# Patient Record
Sex: Male | Born: 1955 | Race: Black or African American | Hispanic: No | Marital: Single | Smoking: Former smoker
Health system: Southern US, Community
[De-identification: ages and names within clinical notes are randomized; demographics above are authoritative.]

## PROBLEM LIST (undated history)

## (undated) DIAGNOSIS — I10 Essential (primary) hypertension: Secondary | ICD-10-CM

## (undated) DIAGNOSIS — F32A Depression, unspecified: Secondary | ICD-10-CM

## (undated) DIAGNOSIS — J449 Chronic obstructive pulmonary disease, unspecified: Secondary | ICD-10-CM

## (undated) DIAGNOSIS — N189 Chronic kidney disease, unspecified: Secondary | ICD-10-CM

## (undated) DIAGNOSIS — F1410 Cocaine abuse, uncomplicated: Secondary | ICD-10-CM

## (undated) DIAGNOSIS — I2510 Atherosclerotic heart disease of native coronary artery without angina pectoris: Secondary | ICD-10-CM

## (undated) DIAGNOSIS — I214 Non-ST elevation (NSTEMI) myocardial infarction: Secondary | ICD-10-CM

## (undated) DIAGNOSIS — J45909 Unspecified asthma, uncomplicated: Secondary | ICD-10-CM

## (undated) DIAGNOSIS — H269 Unspecified cataract: Secondary | ICD-10-CM

## (undated) HISTORY — DX: Cocaine abuse, uncomplicated: F14.10

## (undated) HISTORY — PX: PROSTATE SURGERY: SHX751

## (undated) HISTORY — PX: APPENDECTOMY: SHX54

## (undated) HISTORY — PX: EYE SURGERY: SHX253

## (undated) NOTE — *Deleted (*Deleted)
Adult Psychoeducational Group Not Date:  09/14/2020 Time:  0900-1045 Group Topic/Focus: PROGRESSIVE RELAXATION. A group where deep breathing is taught and tensing and relaxation muscle groups is used. Imagery is used as well.  Pts are asked to imagine 3 pillars that hold them up when they are not able to hold themselves up.  Participation Level:  Active  Participation Quality:  Appropriate  Affect:  Appropriate  Cognitive:  Oriented  Insight: Improving  Engagement in Group:  Engaged  Modes of Intervention:  Activity, Discussion, Education, and Support  Additional Comments:  ***  Shan Padgett A 09/14/2020`  

---

## 2012-10-18 DIAGNOSIS — J449 Chronic obstructive pulmonary disease, unspecified: Secondary | ICD-10-CM | POA: Insufficient documentation

## 2012-10-18 DIAGNOSIS — J45909 Unspecified asthma, uncomplicated: Secondary | ICD-10-CM | POA: Insufficient documentation

## 2016-07-06 DIAGNOSIS — N529 Male erectile dysfunction, unspecified: Secondary | ICD-10-CM | POA: Insufficient documentation

## 2016-07-06 DIAGNOSIS — I10 Essential (primary) hypertension: Secondary | ICD-10-CM | POA: Insufficient documentation

## 2016-07-06 DIAGNOSIS — H409 Unspecified glaucoma: Secondary | ICD-10-CM | POA: Insufficient documentation

## 2016-09-13 DIAGNOSIS — H2511 Age-related nuclear cataract, right eye: Secondary | ICD-10-CM | POA: Insufficient documentation

## 2016-09-15 ENCOUNTER — Ambulatory Visit: Payer: Self-pay | Admitting: Internal Medicine

## 2016-09-26 ENCOUNTER — Emergency Department (HOSPITAL_COMMUNITY)
Admission: EM | Admit: 2016-09-26 | Discharge: 2016-09-26 | Disposition: A | Discharge status: Home / Self Care | Payer: Medicare HMO | Attending: Emergency Medicine | Admitting: Emergency Medicine

## 2016-09-26 ENCOUNTER — Encounter (HOSPITAL_COMMUNITY): Payer: Self-pay | Admitting: Emergency Medicine

## 2016-09-26 DIAGNOSIS — J45909 Unspecified asthma, uncomplicated: Secondary | ICD-10-CM | POA: Insufficient documentation

## 2016-09-26 DIAGNOSIS — F17200 Nicotine dependence, unspecified, uncomplicated: Secondary | ICD-10-CM | POA: Insufficient documentation

## 2016-09-26 DIAGNOSIS — G8918 Other acute postprocedural pain: Secondary | ICD-10-CM | POA: Diagnosis not present

## 2016-09-26 DIAGNOSIS — H5712 Ocular pain, left eye: Secondary | ICD-10-CM | POA: Diagnosis present

## 2016-09-26 HISTORY — DX: Unspecified cataract: H26.9

## 2016-09-26 HISTORY — DX: Unspecified asthma, uncomplicated: J45.909

## 2016-09-26 MED ORDER — HYDROCODONE-ACETAMINOPHEN 5-325 MG PO TABS: 1.0000 | ORAL_TABLET | Freq: Four times a day (QID) | ORAL | 0 refills | Status: DC | PRN

## 2016-09-26 MED ORDER — TETRACAINE HCL 0.5 % OP SOLN
1.0000 [drp] | Freq: Once | OPHTHALMIC | Status: AC
  Administered 2016-09-26: 1 [drp] via OPHTHALMIC
  Filled 2016-09-26: qty 2

## 2016-09-26 NOTE — ED Provider Notes (Signed)
[facility] DEPT Provider Note   CSN: [id number] Arrival date & time: 09/26/16  1448  By signing my name below, I, Robert Chan, attest that this documentation has been prepared under the direction and in the presence of Robert Mail, PA-C. Electronically Signed: Evelene Chan, Scribe. 09/26/2016. 3:25 PM.  History   Chief Complaint Chief Complaint  Patient presents with  . Eye Pain  . Post-op Problem    The history is provided by the patient. No language interpreter was used.   HPI Comments:  Robert Chan is a 60 y.o. male who presents to the Emergency Department s/p left eye cataract surgery with lens implant on 09/17/16 complaining of increased left eye pain x 3-4 days. He reports associated drainage from the eye and photophobia. Pt notes the photophobia is improved when wearing sunglasses. He was discharged with eye drops which he stopped using 2 days ago due to pain. Pt has a follow up appointment on 09/28/16. Pt has no acute complaints or symptoms at this time.   Surgery done at [facility]  Past Medical History:  Diagnosis Date  . Asthma   . Cataract     There are no active problems to display for this patient.   Past Surgical History:  Procedure Laterality Date  . APPENDECTOMY    . EYE SURGERY    . PROSTATE SURGERY         Home Medications    Prior to Admission medications   Medication Sig Start Date End Date Taking? Authorizing Provider  HYDROcodone-acetaminophen (NORCO) 5-325 MG tablet Take 1-2 tablets by mouth every 6 (six) hours as needed. 09/26/16   Robert Mail, PA-C    Family History No family history on file.  Social History Social History  Substance Use Topics  . Smoking status: Current Some Day Smoker  . Smokeless tobacco: Never Used  . Alcohol use Yes     Allergies   Patient has no known allergies.   Review of Systems Review of Systems  Constitutional: Negative for fever.  Eyes: Positive for photophobia, pain and  discharge.     Physical Exam Updated Vital Signs BP 126/84   Pulse 82   Temp 97.7 F (36.5 C) (Oral)   Resp 18   Ht 6\' 4"  (1.93 m)   Wt 113.4 kg   SpO2 100%   BMI 30.43 kg/m   Physical Exam  Constitutional: He is oriented to person, place, and time. He appears well-developed and well-nourished. No distress.  HENT:  Head: Normocephalic and atraumatic.  Eyes: Left eye exhibits no chemosis. Left conjunctiva is injected.  Left eye is injected and sensitive to light ciliary flush  2+ cells with mild flare corneal flap appears to be healing well  Visual acuity and pressure: OD: 20/50; 16 OS: 20/30; 15 Bilateral: 20/30  Cardiovascular: Normal rate.   Pulmonary/Chest: Effort normal.  Abdominal: He exhibits no distension.  Neurological: He is alert and oriented to person, place, and time.  Skin: Skin is warm and dry.  Psychiatric: He has a normal mood and affect.  Nursing note and vitals reviewed.    ED Treatments / Results  DIAGNOSTIC STUDIES:  Oxygen Saturation is 100% on RA, normal by my interpretation.    COORDINATION OF CARE:  3:24 PM Discussed treatment plan with pt at bedside and pt agreed to plan.  Labs (all labs ordered are listed, but only abnormal results are displayed) Labs Reviewed - No data to display  EKG  EKG Interpretation None  Radiology No results found.  Procedures Procedures (including critical care time)  Medications Ordered in ED Medications  tetracaine (PONTOCAINE) 0.5 % ophthalmic solution 1 drop (1 drop Both Eyes Given 09/26/16 1600)     Initial Impression / Assessment and Plan / ED Course  I have reviewed the triage vital signs and the nursing notes.  Pertinent labs & imaging results that were available during my care of the patient were reviewed by me and considered in my medical decision making (see chart for details).  Clinical Course    Patient with Inflamed left eye s/p cataract surgery and lens implant on  09/16/2016. I discussed the case with Dr. Redmond Baseman was on-call for the ophthalmology practice. He does not feel that this is endophthalmitis. He hasn't the patient resume his latanoprost drops immediately and then take the rest of his drops as directed. Marland Kitchen He will be seen in their office first thing Monday morning. I have written the patient for pain medication for his eye.  Patient seen in shared visit with attending physician. Who agrees with assessment, work up , treatment, and plan for discharge.   Final Clinical Impressions(s) / ED Diagnoses   Final diagnoses:  Post-operative pain    New Prescriptions Discharge Medication List as of 09/26/2016  4:23 PM    START taking these medications   Details  HYDROcodone-acetaminophen (NORCO) 5-325 MG tablet Take 1-2 tablets by mouth every 6 (six) hours as needed., Starting Sun 09/26/2016, Print       I personally performed the services described in this documentation, which was scribed in my presence. The recorded information has been reviewed and is accurate.    t}    Robert Mail, PA-C 09/26/16 1640    Duffy Bruce, MD 09/28/16 [phone]

## 2016-09-26 NOTE — Discharge Instructions (Signed)
Take your latanoprost now and then resume your regular eyedrop routine. Call the office first thing in the morning to be seen.

## 2016-09-26 NOTE — ED Triage Notes (Signed)
Pt reports had cataract surgery to right eye on 09/17/2016. Pt reports 2-3 days of having clear drainage from right eye. Pt has appointment with eye doctor next week.

## 2016-09-26 NOTE — ED Notes (Signed)
Declined W/C at D/C and was escorted to lobby by RN. 

## 2017-01-09 ENCOUNTER — Encounter (HOSPITAL_COMMUNITY): Payer: Self-pay

## 2017-01-09 ENCOUNTER — Emergency Department (HOSPITAL_COMMUNITY)
Admission: EM | Admit: 2017-01-09 | Discharge: 2017-01-09 | Disposition: A | Discharge status: Home / Self Care | Payer: Medicare HMO | Attending: Emergency Medicine | Admitting: Emergency Medicine

## 2017-01-09 DIAGNOSIS — I10 Essential (primary) hypertension: Secondary | ICD-10-CM | POA: Diagnosis not present

## 2017-01-09 DIAGNOSIS — F17200 Nicotine dependence, unspecified, uncomplicated: Secondary | ICD-10-CM | POA: Insufficient documentation

## 2017-01-09 DIAGNOSIS — J45909 Unspecified asthma, uncomplicated: Secondary | ICD-10-CM | POA: Insufficient documentation

## 2017-01-09 DIAGNOSIS — R5381 Other malaise: Secondary | ICD-10-CM | POA: Diagnosis present

## 2017-01-09 LAB — BASIC METABOLIC PANEL
Anion gap: 7 (ref 5–15)
BUN: 12 mg/dL (ref 6–20)
CHLORIDE: 108 mmol/L (ref 101–111)
CO2: 22 mmol/L (ref 22–32)
CREATININE: 1.32 mg/dL — AB (ref 0.61–1.24)
Calcium: 8.8 mg/dL — ABNORMAL LOW (ref 8.9–10.3)
GFR calc Af Amer: >60 mL/min (ref >60)
GFR calc non Af Amer: 56 mL/min — ABNORMAL LOW (ref >60)
GLUCOSE: 137 mg/dL — AB (ref 65–99)
Potassium: 3.8 mmol/L (ref 3.5–5.1)
Sodium: 137 mmol/L (ref 135–145)

## 2017-01-09 LAB — CBC WITH DIFFERENTIAL/PLATELET
Basophils Absolute: 0 10*3/uL (ref 0.0–0.1)
Basophils Relative: 0 %
Eosinophils Absolute: 0.3 10*3/uL (ref 0.0–0.7)
Eosinophils Relative: 3 %
HEMATOCRIT: 41.8 % (ref 39.0–52.0)
HEMOGLOBIN: 13.6 g/dL (ref 13.0–17.0)
LYMPHS ABS: 2.4 10*3/uL (ref 0.7–4.0)
Lymphocytes Relative: 25 %
MCH: 30 pg (ref 26.0–34.0)
MCHC: 32.5 g/dL (ref 30.0–36.0)
MCV: 92.3 fL (ref 78.0–100.0)
MONOS PCT: 6 %
Monocytes Absolute: 0.6 10*3/uL (ref 0.1–1.0)
NEUTROS ABS: 6.2 10*3/uL (ref 1.7–7.7)
NEUTROS PCT: 66 %
Platelets: 266 10*3/uL (ref 150–400)
RBC: 4.53 MIL/uL (ref 4.22–5.81)
RDW: 12.6 % (ref 11.5–15.5)
WBC: 9.4 10*3/uL (ref 4.0–10.5)

## 2017-01-09 LAB — URINALYSIS, ROUTINE W REFLEX MICROSCOPIC
Bacteria, UA: NONE SEEN
Bilirubin Urine: NEGATIVE
GLUCOSE, UA: NEGATIVE mg/dL
KETONES UR: NEGATIVE mg/dL
Leukocytes, UA: NEGATIVE
NITRITE: NEGATIVE
PROTEIN: NEGATIVE mg/dL
Specific Gravity, Urine: 1.025 (ref 1.005–1.030)
pH: 5 (ref 5.0–8.0)

## 2017-01-09 NOTE — ED Notes (Signed)
Reports feeling of not being usual spunky self for several days.  Thought he would come in to get checked "even though I have a dr appt on Thursday, I could have waited till then but did not want to."

## 2017-01-09 NOTE — ED Provider Notes (Signed)
[place] DEPT Provider Note   CSN: [id number] Arrival date & time: 01/09/17  1006     History   Chief Complaint Chief Complaint  Patient presents with  . general malaise    HPI Robert Chan is a 61 y.o. male.  61 year old male presents with generalized malaise 1 week. Denies any associated fever, cough, congestion. No diarrhea. Thinks his blood pressure may be elevated. No anginal chest pain. No dyspnea. No urinary symptoms. No recent medications. No history of same. Symptoms persistent and nothing seems to make them better worse. No treatment use prior to arrival.      Past Medical History:  Diagnosis Date  . Asthma   . Cataract     There are no active problems to display for this patient.   Past Surgical History:  Procedure Laterality Date  . APPENDECTOMY    . EYE SURGERY    . PROSTATE SURGERY         Home Medications    Prior to Admission medications   Medication Sig Start Date End Date Taking? Authorizing Provider  HYDROcodone-acetaminophen (NORCO) 5-325 MG tablet Take 1-2 tablets by mouth every 6 (six) hours as needed. 09/26/16   Margarita Mail, PA-C    Family History No family history on file.  Social History Social History  Substance Use Topics  . Smoking status: Current Some Day Smoker  . Smokeless tobacco: Never Used  . Alcohol use Yes     Allergies   Patient has no known allergies.   Review of Systems Review of Systems  All other systems reviewed and are negative.    Physical Exam Updated Vital Signs BP 135/88 (BP Location: Right Arm)   Pulse 74   Temp 98.2 F (36.8 C) (Oral)   Resp 18   SpO2 97%   Physical Exam  Constitutional: He is oriented to person, place, and time. He appears well-developed and well-nourished.  Non-toxic appearance. No distress.  HENT:  Head: Normocephalic and atraumatic.  Eyes: Conjunctivae, EOM and lids are normal. Pupils are equal, round, and reactive to light.  Neck: Normal range of  motion. Neck supple. No tracheal deviation present. No thyroid mass present.  Cardiovascular: Normal rate, regular rhythm and normal heart sounds.  Exam reveals no gallop.   No murmur heard. Pulmonary/Chest: Effort normal and breath sounds normal. No stridor. No respiratory distress. He has no decreased breath sounds. He has no wheezes. He has no rhonchi. He has no rales.  Abdominal: Soft. Normal appearance and bowel sounds are normal. He exhibits no distension. There is no tenderness. There is no rebound and no CVA tenderness.  Musculoskeletal: Normal range of motion. He exhibits no edema or tenderness.  Neurological: He is alert and oriented to person, place, and time. He has normal strength. No cranial nerve deficit or sensory deficit. GCS eye subscore is 4. GCS verbal subscore is 5. GCS motor subscore is 6.  Skin: Skin is warm and dry. No abrasion and no rash noted.  Psychiatric: He has a normal mood and affect. His speech is normal and behavior is normal.  Nursing note and vitals reviewed.    ED Treatments / Results  Labs (all labs ordered are listed, but only abnormal results are displayed) Labs Reviewed  BASIC METABOLIC PANEL - Abnormal; Notable for the following:       Result Value   Glucose, Bld 137 (*)    Creatinine, Ser 1.32 (*)    Calcium 8.8 (*)    GFR calc non Af  Amer 56 (*)    All other components within normal limits  CBC WITH DIFFERENTIAL/PLATELET  URINALYSIS, ROUTINE W REFLEX MICROSCOPIC    EKG  EKG Interpretation None       Radiology No results found.  Procedures Procedures (including critical care time)  Medications Ordered in ED Medications - No data to display   Initial Impression / Assessment and Plan / ED Course  I have reviewed the triage vital signs and the nursing notes.  Pertinent labs & imaging results that were available during my care of the patient were reviewed by me and considered in my medical decision making (see chart for  details).     Labs are reassuring patient to follow-up with his doctor about his high blood pressure  Final Clinical Impressions(s) / ED Diagnoses   Final diagnoses:  None    New Prescriptions New Prescriptions   No medications on file     Lacretia Leigh, MD 01/09/17 1303

## 2017-01-09 NOTE — ED Triage Notes (Signed)
Patient complains of 1 week of general malaise. No chills, no URI, no GI complaints, no pain. On arrival alert and oriented, NAD. vss

## 2017-08-18 ENCOUNTER — Emergency Department (HOSPITAL_COMMUNITY)
Admission: EM | Admit: 2017-08-18 | Discharge: 2017-08-18 | Disposition: A | Discharge status: Home / Self Care | Payer: Medicare HMO | Attending: Emergency Medicine | Admitting: Emergency Medicine

## 2017-08-18 ENCOUNTER — Emergency Department (HOSPITAL_COMMUNITY): Payer: Medicare HMO

## 2017-08-18 ENCOUNTER — Encounter (HOSPITAL_COMMUNITY): Payer: Self-pay | Admitting: Emergency Medicine

## 2017-08-18 DIAGNOSIS — Z79899 Other long term (current) drug therapy: Secondary | ICD-10-CM | POA: Insufficient documentation

## 2017-08-18 DIAGNOSIS — B353 Tinea pedis: Secondary | ICD-10-CM | POA: Diagnosis not present

## 2017-08-18 DIAGNOSIS — J441 Chronic obstructive pulmonary disease with (acute) exacerbation: Secondary | ICD-10-CM | POA: Diagnosis not present

## 2017-08-18 DIAGNOSIS — J45909 Unspecified asthma, uncomplicated: Secondary | ICD-10-CM | POA: Diagnosis not present

## 2017-08-18 DIAGNOSIS — R0602 Shortness of breath: Secondary | ICD-10-CM | POA: Diagnosis present

## 2017-08-18 DIAGNOSIS — F17200 Nicotine dependence, unspecified, uncomplicated: Secondary | ICD-10-CM | POA: Insufficient documentation

## 2017-08-18 LAB — BASIC METABOLIC PANEL
Anion gap: 8 (ref 5–15)
BUN: 12 mg/dL (ref 6–20)
CHLORIDE: 108 mmol/L (ref 101–111)
CO2: 22 mmol/L (ref 22–32)
CREATININE: 1.38 mg/dL — AB (ref 0.61–1.24)
Calcium: 8.7 mg/dL — ABNORMAL LOW (ref 8.9–10.3)
GFR calc Af Amer: >60 mL/min (ref >60)
GFR calc non Af Amer: 53 mL/min — ABNORMAL LOW (ref >60)
Glucose, Bld: 150 mg/dL — ABNORMAL HIGH (ref 65–99)
POTASSIUM: 3.5 mmol/L (ref 3.5–5.1)
SODIUM: 138 mmol/L (ref 135–145)

## 2017-08-18 LAB — CBC
HEMATOCRIT: 37.4 % — AB (ref 39.0–52.0)
Hemoglobin: 12.5 g/dL — ABNORMAL LOW (ref 13.0–17.0)
MCH: 30.9 pg (ref 26.0–34.0)
MCHC: 33.4 g/dL (ref 30.0–36.0)
MCV: 92.6 fL (ref 78.0–100.0)
PLATELETS: 253 10*3/uL (ref 150–400)
RBC: 4.04 MIL/uL — ABNORMAL LOW (ref 4.22–5.81)
RDW: 12.6 % (ref 11.5–15.5)
WBC: 10.2 10*3/uL (ref 4.0–10.5)

## 2017-08-18 LAB — I-STAT TROPONIN, ED: Troponin i, poc: 0.01 ng/mL (ref 0.00–0.08)

## 2017-08-18 MED ORDER — ALBUTEROL SULFATE (2.5 MG/3ML) 0.083% IN NEBU
5.0000 mg | INHALATION_SOLUTION | Freq: Once | RESPIRATORY_TRACT | Status: AC
  Administered 2017-08-18: 5 mg via RESPIRATORY_TRACT
  Filled 2017-08-18: qty 6

## 2017-08-18 MED ORDER — CLOTRIMAZOLE 1 % EX CREA: TOPICAL_CREAM | CUTANEOUS | 0 refills | Status: DC

## 2017-08-18 MED ORDER — IPRATROPIUM BROMIDE 0.02 % IN SOLN
0.5000 mg | Freq: Once | RESPIRATORY_TRACT | Status: AC
  Administered 2017-08-18: 0.5 mg via RESPIRATORY_TRACT
  Filled 2017-08-18: qty 2.5

## 2017-08-18 MED ORDER — ALBUTEROL SULFATE (2.5 MG/3ML) 0.083% IN NEBU
INHALATION_SOLUTION | RESPIRATORY_TRACT | Status: AC
  Filled 2017-08-18: qty 6

## 2017-08-18 MED ORDER — ALBUTEROL SULFATE HFA 108 (90 BASE) MCG/ACT IN AERS
2.0000 | INHALATION_SPRAY | RESPIRATORY_TRACT | Status: DC | PRN
  Administered 2017-08-18: 2 via RESPIRATORY_TRACT
  Filled 2017-08-18: qty 6.7

## 2017-08-18 MED ORDER — ALBUTEROL SULFATE (2.5 MG/3ML) 0.083% IN NEBU
5.0000 mg | INHALATION_SOLUTION | Freq: Once | RESPIRATORY_TRACT | Status: AC
  Administered 2017-08-18: 5 mg via RESPIRATORY_TRACT

## 2017-08-18 MED ORDER — PREDNISONE 20 MG PO TABS: 60.0000 mg | ORAL_TABLET | Freq: Every day | ORAL | 0 refills | Status: DC

## 2017-08-18 NOTE — Discharge Instructions (Signed)
FOLLOW UP WITH YOUR DOCTOR FOR RECHECK AND FURTHER MANAGEMENT OF COPD EARLY NEXT WEEK. TAKE MEDICATIONS AS PRESCRIBED AND RETURN TO THE EMERGENCY DEPARTMENT WITH ANY WORSENING SYMPTOMS.

## 2017-08-18 NOTE — ED Provider Notes (Signed)
[place] EMERGENCY DEPARTMENT Provider Note   CSN: [id number] Arrival date & time: 08/18/17  0153     History   Chief Complaint Chief Complaint  Patient presents with  . Shortness of Breath    HPI Robert Chan is a 61 y.o. male.  Patient with a history of COPD, presents with SOB, and chest tightness similar to previous episodes of COPD exacerbation. No fever or productive cough. He reports he recently moved to the area and no longer has his nebulizer and he ran out of his inhaler. No vomiting, diaphoresis. He also complains of a rash to bilateral feet that is dry, itching and scaling. No open wounds, bleeding or drainage.   The history is provided by the patient. No language interpreter was used.  Shortness of Breath  Pertinent negatives include no fever, no cough and no vomiting.    Past Medical History:  Diagnosis Date  . Asthma   . Cataract     There are no active problems to display for this patient.   Past Surgical History:  Procedure Laterality Date  . APPENDECTOMY    . EYE SURGERY    . PROSTATE SURGERY         Home Medications    Prior to Admission medications   Medication Sig Start Date End Date Taking? Authorizing Provider  albuterol (PROVENTIL HFA;VENTOLIN HFA) 108 (90 Base) MCG/ACT inhaler Inhale 1-2 puffs into the lungs every 6 (six) hours as needed for wheezing or shortness of breath.   Yes [provider]  budesonide-formoterol (SYMBICORT) 160-4.5 MCG/ACT inhaler Inhale 2 puffs into the lungs 2 (two) times daily. 07/06/16  Yes [provider]  HYDROcodone-acetaminophen (NORCO) 5-325 MG tablet Take 1-2 tablets by mouth every 6 (six) hours as needed. Patient not taking: Reported on 08/18/2017 09/26/16   Margarita Mail, PA-C    Family History No family history on file.  Social History Social History  Substance Use Topics  . Smoking status: Current Some Day Smoker  . Smokeless tobacco: Never Used  .  Alcohol use Yes     Allergies   Patient has no known allergies.   Review of Systems Review of Systems  Constitutional: Negative for fever.  HENT: Negative.   Respiratory: Positive for chest tightness and shortness of breath. Negative for cough.   Gastrointestinal: Negative for nausea and vomiting.  Musculoskeletal: Negative.      Physical Exam Updated Vital Signs BP (!) 148/91   Pulse 69   Temp 97.6 F (36.4 C) (Oral)   Resp 19   Ht 6\' 4"  (1.93 m)   Wt 104.3 kg (230 lb)   SpO2 100%   BMI 28.00 kg/m   Physical Exam  Constitutional: He is oriented to person, place, and time. He appears well-developed and well-nourished.  HENT:  Head: Normocephalic.  Neck: Normal range of motion. Neck supple.  Cardiovascular: Normal rate and regular rhythm.   No murmur heard. Pulmonary/Chest: Effort normal and breath sounds normal. He has no wheezes. He has no rales. He exhibits no tenderness.  *Patient examined after nebulizer treatment x 1. Minimal end expiratory wheezing in upper lobes.   Abdominal: Soft. Bowel sounds are normal. There is no tenderness. There is no rebound and no guarding.  Musculoskeletal: Normal range of motion. He exhibits no edema.  Neurological: He is alert and oriented to person, place, and time.  Skin: Skin is warm and dry. No rash noted.  Hyperpigmented, scaling patchy rash to dorsal feet bilaterally. No interphalangeal  rash. No open sores or drainage.   Psychiatric: He has a normal mood and affect.     ED Treatments / Results  Labs (all labs ordered are listed, but only abnormal results are displayed) Labs Reviewed  BASIC METABOLIC PANEL - Abnormal; Notable for the following:       Result Value   Glucose, Bld 150 (*)    Creatinine, Ser 1.38 (*)    Calcium 8.7 (*)    GFR calc non Af Amer 53 (*)    All other components within normal limits  CBC - Abnormal; Notable for the following:    RBC 4.04 (*)    Hemoglobin 12.5 (*)    HCT 37.4 (*)    All  other components within normal limits  I-STAT TROPONIN, ED   Results for orders placed or performed during the hospital encounter of 60/63/01  Basic metabolic panel  Result Value Ref Range   Sodium 138 135 - 145 mmol/L   Potassium 3.5 3.5 - 5.1 mmol/L   Chloride 108 101 - 111 mmol/L   CO2 22 22 - 32 mmol/L   Glucose, Bld 150 (H) 65 - 99 mg/dL   BUN 12 6 - 20 mg/dL   Creatinine, Ser 1.38 (H) 0.61 - 1.24 mg/dL   Calcium 8.7 (L) 8.9 - 10.3 mg/dL   GFR calc non Af Amer 53 (L) >60 mL/min   GFR calc Af Amer >60 >60 mL/min   Anion gap 8 5 - 15  CBC  Result Value Ref Range   WBC 10.2 4.0 - 10.5 K/uL   RBC 4.04 (L) 4.22 - 5.81 MIL/uL   Hemoglobin 12.5 (L) 13.0 - 17.0 g/dL   HCT 37.4 (L) 39.0 - 52.0 %   MCV 92.6 78.0 - 100.0 fL   MCH 30.9 26.0 - 34.0 pg   MCHC 33.4 30.0 - 36.0 g/dL   RDW 12.6 11.5 - 15.5 %   Platelets 253 150 - 400 K/uL  I-Stat Troponin, ED (not at [facility])  Result Value Ref Range   Troponin i, poc 0.01 0.00 - 0.08 ng/mL   Comment 3            EKG  EKG Interpretation  Date/Time:  Thursday August 18 2017 02:19:05 EDT Ventricular Rate:  75 PR Interval:  192 QRS Duration: 96 QT Interval:  396 QTC Calculation: 442 R Axis:   46 Text Interpretation:  Normal sinus rhythm Low voltage QRS ST & T wave abnormality, consider anterior ischemia Abnormal ECG biphasic t waves noted in anterior leads Confirmed by Ripley Fraise [phone]) on 08/18/2017 2:40:24 AM       Radiology Dg Chest 2 View  Result Date: 08/18/2017 CLINICAL DATA:  Cough, congestion, and shortness of breath for 4 days. History of COPD and asthma. EXAM: CHEST  2 VIEW COMPARISON:  None. FINDINGS: Mild hyperinflation. Normal heart size and pulmonary vascularity. No focal airspace disease or consolidation in the lungs. No blunting of costophrenic angles. No pneumothorax. Mediastinal contours appear intact. IMPRESSION: No active cardiopulmonary disease. Electronically Signed   By: Lucienne Capers M.D.   On:  08/18/2017 02:46    Procedures Procedures (including critical care time)  Medications Ordered in ED Medications  albuterol (PROVENTIL) (2.5 MG/3ML) 0.083% nebulizer solution 5 mg (5 mg Nebulization Given 08/18/17 0219)  albuterol (PROVENTIL) (2.5 MG/3ML) 0.083% nebulizer solution 5 mg (5 mg Nebulization Given 08/18/17 0527)  ipratropium (ATROVENT) nebulizer solution 0.5 mg (0.5 mg Nebulization Given 08/18/17 0527)     Initial Impression /  Assessment and Plan / ED Course  I have reviewed the triage vital signs and the nursing notes.  Pertinent labs & imaging results that were available during my care of the patient were reviewed by me and considered in my medical decision making (see chart for details).     Patient presents with wheezing and SOB with chest tightness. He denies chest pain. Labs are reassuring, negative troponin. EKG without ischemic change. CXR clear.   Second nebulizer treatment provided with complete resolution of wheezing. Patient states he feels much better. He can be discharged home with PCP follow up. Inhaler provided. Will do 3 days of prednisone as well.   Will give topical anti-fungal for tinea pedis.  Final Clinical Impressions(s) / ED Diagnoses   Final diagnoses:  None   1. COPD Exacerbation 2. Tinea pedis   New Prescriptions New Prescriptions   No medications on file     Charlann Lange, Hershal Coria 08/18/17 4975    Veryl Speak, MD 08/25/17 [phone]

## 2017-08-18 NOTE — ED Triage Notes (Signed)
Pt c/o SOB and chest tightness for the past few days, no fever or chills.

## 2017-09-06 ENCOUNTER — Encounter (HOSPITAL_COMMUNITY): Payer: Self-pay | Admitting: Emergency Medicine

## 2017-09-06 ENCOUNTER — Ambulatory Visit (HOSPITAL_COMMUNITY)
Admission: EM | Admit: 2017-09-06 | Discharge: 2017-09-06 | Disposition: A | Discharge status: Home / Self Care | Payer: Medicare HMO | Attending: Internal Medicine | Admitting: Internal Medicine

## 2017-09-06 DIAGNOSIS — J4541 Moderate persistent asthma with (acute) exacerbation: Secondary | ICD-10-CM | POA: Diagnosis not present

## 2017-09-06 MED ORDER — ALBUTEROL SULFATE HFA 108 (90 BASE) MCG/ACT IN AERS: 1.0000 | INHALATION_SPRAY | Freq: Four times a day (QID) | RESPIRATORY_TRACT | 0 refills | Status: DC | PRN

## 2017-09-06 MED ORDER — BUDESONIDE-FORMOTEROL FUMARATE 160-4.5 MCG/ACT IN AERO: 2.0000 | INHALATION_SPRAY | Freq: Two times a day (BID) | RESPIRATORY_TRACT | 0 refills | Status: DC

## 2017-09-06 MED ORDER — IPRATROPIUM-ALBUTEROL 0.5-2.5 (3) MG/3ML IN SOLN
RESPIRATORY_TRACT | Status: AC
  Filled 2017-09-06: qty 3

## 2017-09-06 MED ORDER — IPRATROPIUM-ALBUTEROL 0.5-2.5 (3) MG/3ML IN SOLN
3.0000 mL | Freq: Once | RESPIRATORY_TRACT | Status: AC
  Administered 2017-09-06: 3 mL via RESPIRATORY_TRACT

## 2017-09-06 NOTE — Discharge Instructions (Signed)
I have refilled your medications. Restart symbicort and albuterol as directed. Monitor for any worsening of symptoms, fever, shortness of breath, trouble breathing, follow up for reevaluation. Otherwise, follow up with PCP for further management of asthma.

## 2017-09-06 NOTE — ED Triage Notes (Signed)
Pt here for cough and asthma issues; pt sts out of inhalers at home

## 2017-09-06 NOTE — ED Provider Notes (Signed)
[place]    CSN: [id number] Arrival date & time: 09/06/17  1320     History   Chief Complaint Chief Complaint  Patient presents with  . Cough    HPI Robert Chan is a 61 y.o. male.   61 year old male with history of COPD, asthma comes in for 3 days of cough and wheezing. Has been out of asthma medicine for the past couple of days. Productive cough, no worsening at specific time of the day. Denies nasal congestion/rhinorrhea. Denies fever, chills, night sweats. Denies sick contact. Some day smoker, 1-2x/week, with 1-2 cigs at a time. Chest tightness with shortness of breath. Denies chest pain, palpitations, swelling in the legs. Denies weakness, dizziness, syncope.       Past Medical History:  Diagnosis Date  . Asthma   . Cataract     There are no active problems to display for this patient.   Past Surgical History:  Procedure Laterality Date  . APPENDECTOMY    . EYE SURGERY    . PROSTATE SURGERY         Home Medications    Prior to Admission medications   Medication Sig Start Date End Date Taking? Authorizing Provider  albuterol (PROVENTIL HFA;VENTOLIN HFA) 108 (90 Base) MCG/ACT inhaler Inhale 1-2 puffs every 6 (six) hours as needed into the lungs for wheezing or shortness of breath. 09/06/17   Tasia Catchings, Deja Pisarski V, PA-C  budesonide-formoterol (SYMBICORT) 160-4.5 MCG/ACT inhaler Inhale 2 puffs 2 (two) times daily into the lungs. 09/06/17   Ok Edwards, PA-C  clotrimazole (LOTRIMIN) 1 % cream Apply to affected area 2 times daily 08/18/17   Charlann Lange, PA-C  HYDROcodone-acetaminophen (NORCO) 5-325 MG tablet Take 1-2 tablets by mouth every 6 (six) hours as needed. Patient not taking: Reported on 08/18/2017 09/26/16   Margarita Mail, PA-C  predniSONE (DELTASONE) 20 MG tablet Take 3 tablets (60 mg total) by mouth daily. 08/18/17   Charlann Lange, PA-C    Family History History reviewed. No pertinent family history.  Social History Social History    Tobacco Use  . Smoking status: Current Some Day Smoker  . Smokeless tobacco: Never Used  Substance Use Topics  . Alcohol use: Yes  . Drug use: No     Allergies   Patient has no known allergies.   Review of Systems Review of Systems  Reason unable to perform ROS: See HPI as above.     Physical Exam Triage Vital Signs ED Triage Vitals  Enc Vitals Group     BP 09/06/17 1356 (!) 144/90     Pulse Rate 09/06/17 1356 75     Resp 09/06/17 1356 18     Temp 09/06/17 1356 98 F (36.7 C)     Temp Source 09/06/17 1356 Oral     SpO2 09/06/17 1356 98 %     Weight --      Height --      Head Circumference --      Peak Flow --      Pain Score 09/06/17 1357 8     Pain Loc --      Pain Edu? --      Excl. in [place]? --    No data found.  Updated Vital Signs BP (!) 144/90 (BP Location: Right Arm)   Pulse 75   Temp 98 F (36.7 C) (Oral)   Resp 18   SpO2 98%   Physical Exam  Constitutional: He is oriented to person, place, and  time. He appears well-developed and well-nourished. No distress.  HENT:  Head: Normocephalic and atraumatic.  Right Ear: Tympanic membrane, external ear and ear canal normal. Tympanic membrane is not erythematous and not bulging.  Left Ear: Tympanic membrane, external ear and ear canal normal. Tympanic membrane is not erythematous and not bulging.  Nose: Nose normal. Right sinus exhibits no maxillary sinus tenderness and no frontal sinus tenderness. Left sinus exhibits no maxillary sinus tenderness and no frontal sinus tenderness.  Mouth/Throat: Uvula is midline, oropharynx is clear and moist and mucous membranes are normal.  Eyes: Conjunctivae are normal. Pupils are equal, round, and reactive to light.  Neck: Normal range of motion. Neck supple.  Cardiovascular: Normal rate, regular rhythm and normal heart sounds. Exam reveals no gallop and no friction rub.  No murmur heard. Pulmonary/Chest: Effort normal.  Diffuse expiratory wheezing. No obvious  rhonchi/rales.   Lymphadenopathy:    He has no cervical adenopathy.  Neurological: He is alert and oriented to person, place, and time.  Skin: Skin is warm and dry.  Psychiatric: He has a normal mood and affect. His behavior is normal. Judgment normal.     UC Treatments / Results  Labs (all labs ordered are listed, but only abnormal results are displayed) Labs Reviewed - No data to display  EKG  EKG Interpretation None       Radiology No results found.  Procedures Procedures (including critical care time)  Medications Ordered in UC Medications  ipratropium-albuterol (DUONEB) 0.5-2.5 (3) MG/3ML nebulizer solution 3 mL (3 mLs Nebulization Given 09/06/17 1540)     Initial Impression / Assessment and Plan / UC Course  I have reviewed the triage vital signs and the nursing notes.  Pertinent labs & imaging results that were available during my care of the patient were reviewed by me and considered in my medical decision making (see chart for details).    Patient lungs clear to auscultation bilaterally after duoneb treatment with resolution of chest tightness/shortness of breath. Will refill symbicort and albuterol. Patient to follow up with PCP for further management and treatment needed. Return precautions given.   Final Clinical Impressions(s) / UC Diagnoses   Final diagnoses:  Moderate persistent asthma with acute exacerbation    ED Discharge Orders        Ordered    albuterol (PROVENTIL HFA;VENTOLIN HFA) 108 (90 Base) MCG/ACT inhaler  Every 6 hours PRN     09/06/17 1601    budesonide-formoterol (SYMBICORT) 160-4.5 MCG/ACT inhaler  2 times daily     09/06/17 1601         Ok Edwards, PA-C 09/06/17 1605

## 2017-09-08 ENCOUNTER — Emergency Department (HOSPITAL_COMMUNITY): Payer: Medicare HMO

## 2017-09-08 ENCOUNTER — Emergency Department (HOSPITAL_COMMUNITY)
Admission: EM | Admit: 2017-09-08 | Discharge: 2017-09-08 | Disposition: A | Discharge status: Home / Self Care | Payer: Medicare HMO | Attending: Emergency Medicine | Admitting: Emergency Medicine

## 2017-09-08 ENCOUNTER — Encounter (HOSPITAL_COMMUNITY): Payer: Self-pay | Admitting: Emergency Medicine

## 2017-09-08 DIAGNOSIS — J449 Chronic obstructive pulmonary disease, unspecified: Secondary | ICD-10-CM | POA: Diagnosis not present

## 2017-09-08 DIAGNOSIS — Z79899 Other long term (current) drug therapy: Secondary | ICD-10-CM | POA: Insufficient documentation

## 2017-09-08 DIAGNOSIS — R079 Chest pain, unspecified: Secondary | ICD-10-CM | POA: Diagnosis present

## 2017-09-08 DIAGNOSIS — F17200 Nicotine dependence, unspecified, uncomplicated: Secondary | ICD-10-CM | POA: Insufficient documentation

## 2017-09-08 DIAGNOSIS — I10 Essential (primary) hypertension: Secondary | ICD-10-CM | POA: Diagnosis not present

## 2017-09-08 DIAGNOSIS — J45909 Unspecified asthma, uncomplicated: Secondary | ICD-10-CM

## 2017-09-08 HISTORY — DX: Chronic obstructive pulmonary disease, unspecified: J44.9

## 2017-09-08 HISTORY — DX: Essential (primary) hypertension: I10

## 2017-09-08 LAB — I-STAT TROPONIN, ED: TROPONIN I, POC: 0 ng/mL (ref 0.00–0.08)

## 2017-09-08 LAB — BASIC METABOLIC PANEL
ANION GAP: 4 — AB (ref 5–15)
BUN: 11 mg/dL (ref 6–20)
CALCIUM: 8.6 mg/dL — AB (ref 8.9–10.3)
CO2: 24 mmol/L (ref 22–32)
Chloride: 110 mmol/L (ref 101–111)
Creatinine, Ser: 1.46 mg/dL — ABNORMAL HIGH (ref 0.61–1.24)
GFR calc Af Amer: 58 mL/min — ABNORMAL LOW (ref >60)
GFR, EST NON AFRICAN AMERICAN: 50 mL/min — AB (ref >60)
GLUCOSE: 126 mg/dL — AB (ref 65–99)
Potassium: 3.7 mmol/L (ref 3.5–5.1)
SODIUM: 138 mmol/L (ref 135–145)

## 2017-09-08 LAB — CBC
HCT: 36.7 % — ABNORMAL LOW (ref 39.0–52.0)
Hemoglobin: 11.9 g/dL — ABNORMAL LOW (ref 13.0–17.0)
MCH: 30.3 pg (ref 26.0–34.0)
MCHC: 32.4 g/dL (ref 30.0–36.0)
MCV: 93.4 fL (ref 78.0–100.0)
Platelets: 288 10*3/uL (ref 150–400)
RBC: 3.93 MIL/uL — ABNORMAL LOW (ref 4.22–5.81)
RDW: 12.5 % (ref 11.5–15.5)
WBC: 9.4 10*3/uL (ref 4.0–10.5)

## 2017-09-08 MED ORDER — ALBUTEROL SULFATE (2.5 MG/3ML) 0.083% IN NEBU
5.0000 mg | INHALATION_SOLUTION | Freq: Once | RESPIRATORY_TRACT | Status: AC
  Administered 2017-09-08: 5 mg via RESPIRATORY_TRACT

## 2017-09-08 MED ORDER — ALBUTEROL SULFATE HFA 108 (90 BASE) MCG/ACT IN AERS
2.0000 | INHALATION_SPRAY | Freq: Once | RESPIRATORY_TRACT | Status: AC
  Administered 2017-09-08: 2 via RESPIRATORY_TRACT
  Filled 2017-09-08: qty 6.7

## 2017-09-08 MED ORDER — ALBUTEROL SULFATE HFA 108 (90 BASE) MCG/ACT IN AERS: 1.0000 | INHALATION_SPRAY | Freq: Four times a day (QID) | RESPIRATORY_TRACT | 1 refills | Status: DC | PRN

## 2017-09-08 MED ORDER — ALBUTEROL SULFATE (2.5 MG/3ML) 0.083% IN NEBU
INHALATION_SOLUTION | RESPIRATORY_TRACT | Status: AC
  Filled 2017-09-08: qty 6

## 2017-09-08 MED ORDER — DEXAMETHASONE 4 MG PO TABS
10.0000 mg | ORAL_TABLET | Freq: Once | ORAL | Status: AC
  Administered 2017-09-08: 10 mg via ORAL
  Filled 2017-09-08: qty 3

## 2017-09-08 MED ORDER — BUDESONIDE-FORMOTEROL FUMARATE 160-4.5 MCG/ACT IN AERO: 2.0000 | INHALATION_SPRAY | Freq: Two times a day (BID) | RESPIRATORY_TRACT | 0 refills | Status: DC

## 2017-09-08 NOTE — ED Notes (Signed)
Pt stable, ambulatory, states understanding of discharge instructions 

## 2017-09-08 NOTE — Discharge Instructions (Signed)
Thank you for allowing [place] to care for you  Your symptoms are believed to be due to your Asthma and COPD worsening while you were out of your inhalers. - We have provided you with prescriptions for your home Symbicort and albuterol  Please follow up with your primary care provider in the next 2 weeks  Please return to the ED if you experience a return of severe symptoms

## 2017-09-08 NOTE — ED Triage Notes (Signed)
Pt c/o non-radiating chest pressure and shortness of breath x 3 days. Pt reports that he has a hx COPD/asthma, and has been out of his inhalers for 3 days as well.

## 2017-09-08 NOTE — ED Provider Notes (Signed)
[place] EMERGENCY DEPARTMENT Provider Note   CSN: [id number] Arrival date & time: 09/08/17  1854     History   Chief Complaint Chief Complaint  Patient presents with  . Chest Pain    HPI Robert Chan is a 61 y.o. male.  Patient with a history of Asthma, COPD, and Hypertension presents with chest tightness and shortness of breath. The SOB started 3 days ago and is assosciated with his running out of his inhalers for his COPD and Asthma. He states that he was feeling more short of breath tonight and did not feel comfortable going to sleep without his inhalers. His chest tightness was associated with the shortness of breath. He denies chest pain. He states he feels back to his baseline after receiving albuterol nebulization.        Past Medical History:  Diagnosis Date  . Asthma   . Cataract   . COPD (chronic obstructive pulmonary disease) ([place])   . Hypertension     There are no active problems to display for this patient.   Past Surgical History:  Procedure Laterality Date  . APPENDECTOMY    . EYE SURGERY    . PROSTATE SURGERY         Home Medications    Prior to Admission medications   Medication Sig Start Date End Date Taking? Authorizing Provider  albuterol (PROVENTIL HFA;VENTOLIN HFA) 108 (90 Base) MCG/ACT inhaler Inhale 1-2 puffs every 6 (six) hours as needed into the lungs for wheezing or shortness of breath. 09/06/17  Yes Yu, Amy V, PA-C  budesonide-formoterol (SYMBICORT) 160-4.5 MCG/ACT inhaler Inhale 2 puffs 2 (two) times daily into the lungs. 09/06/17  Yes Yu, Amy V, PA-C  clotrimazole (LOTRIMIN) 1 % cream Apply to affected area 2 times daily 08/18/17  Yes Upstill, Shari, PA-C  albuterol (PROVENTIL HFA;VENTOLIN HFA) 108 (90 Base) MCG/ACT inhaler Inhale 1-2 puffs every 6 (six) hours as needed into the lungs for wheezing or shortness of breath. 09/08/17   Neva Seat, MD  budesonide-formoterol [facility]) 160-4.5 MCG/ACT inhaler  Inhale 2 puffs 2 (two) times daily into the lungs. 09/08/17   Neva Seat, MD  HYDROcodone-acetaminophen (NORCO) 5-325 MG tablet Take 1-2 tablets by mouth every 6 (six) hours as needed. Patient not taking: Reported on 08/18/2017 09/26/16   Margarita Mail, PA-C  predniSONE (DELTASONE) 20 MG tablet Take 3 tablets (60 mg total) by mouth daily. Patient not taking: Reported on 09/08/2017 08/18/17   Charlann Lange, PA-C    Family History No family history on file.  Social History Social History   Tobacco Use  . Smoking status: Current Some Day Smoker  . Smokeless tobacco: Never Used  Substance Use Topics  . Alcohol use: Yes  . Drug use: No     Allergies   Patient has no known allergies.   Review of Systems Review of Systems  Constitutional: Negative for chills and fever.  HENT: Negative for ear pain and sore throat.   Eyes: Negative for pain and visual disturbance.  Respiratory: Positive for chest tightness and shortness of breath. Negative for cough.   Cardiovascular: Negative for chest pain and palpitations.  Gastrointestinal: Negative for abdominal pain and vomiting.  Genitourinary: Negative for dysuria and hematuria.  Musculoskeletal: Negative for arthralgias and back pain.  Skin: Negative for color change and rash.  Neurological: Negative for seizures and syncope.  All other systems reviewed and are negative.    Physical Exam Updated Vital Signs BP 136/82   Pulse 72  Temp 98 F (36.7 C) (Oral)   Resp (!) 22   Ht 6\' 4"  (1.93 m)   Wt 111.1 kg (245 lb)   SpO2 96%   BMI 29.82 kg/m   Physical Exam  Constitutional: He appears well-developed and well-nourished.  HENT:  Head: Normocephalic and atraumatic.  Eyes: Conjunctivae are normal.  Neck: Neck supple.  Cardiovascular: Normal rate and regular rhythm.  No murmur heard. Pulmonary/Chest: Effort normal and breath sounds normal. No respiratory distress.  Abdominal: Soft. There is no tenderness.    Musculoskeletal: He exhibits no edema.  Neurological: He is alert.  Skin: Skin is warm and dry.  Psychiatric: He has a normal mood and affect.  Nursing note and vitals reviewed.    ED Treatments / Results  Labs (all labs ordered are listed, but only abnormal results are displayed) Labs Reviewed  BASIC METABOLIC PANEL - Abnormal; Notable for the following components:      Result Value   Glucose, Bld 126 (*)    Creatinine, Ser 1.46 (*)    Calcium 8.6 (*)    GFR calc non Af Amer 50 (*)    GFR calc Af Amer 58 (*)    Anion gap 4 (*)    All other components within normal limits  CBC - Abnormal; Notable for the following components:   RBC 3.93 (*)    Hemoglobin 11.9 (*)    HCT 36.7 (*)    All other components within normal limits  I-STAT TROPONIN, ED    EKG  EKG Interpretation None       Radiology Dg Chest 2 View  Result Date: 09/08/2017 CLINICAL DATA:  Pt c/o non-radiating chest pressure and shortness of breath x 3 days. Pt reports that he has a hx COPD/asthma, and has been out of his inhalers for 3 days as well. EXAM: CHEST  2 VIEW COMPARISON:  Chest x-ray dated 08/18/2017. FINDINGS: Heart size and mediastinal contours are within normal limits. Lungs are clear. No pleural effusion or pneumothorax seen. Again noted is mild hyperinflation. No acute or suspicious osseous finding. IMPRESSION: No active cardiopulmonary disease. No evidence of pneumonia or pulmonary edema. Lungs are mildly hyperexpanded, compatible with history of COPD/asthma. Electronically Signed   By: Franki Cabot M.D.   On: 09/08/2017 20:23    Procedures Procedures (including critical care time)  Medications Ordered in ED Medications  albuterol (PROVENTIL) (2.5 MG/3ML) 0.083% nebulizer solution (  Not Given 09/08/17 2144)  albuterol (PROVENTIL) (2.5 MG/3ML) 0.083% nebulizer solution 5 mg (5 mg Nebulization Given 09/08/17 1937)  dexamethasone (DECADRON) tablet 10 mg (10 mg Oral Given 09/08/17 2237)   albuterol (PROVENTIL HFA;VENTOLIN HFA) 108 (90 Base) MCG/ACT inhaler 2 puff (2 puffs Inhalation Given 09/08/17 2237)     Initial Impression / Assessment and Plan / ED Course  I have reviewed the triage vital signs and the nursing notes.  Pertinent labs & imaging results that were available during my care of the patient were reviewed by me and considered in my medical decision making (see chart for details).    Patient with a history of COPD and Asthma who presented with SOB and chest tightness after being out of his inhalers. His symptoms improved to baseline following albuterol nebulization. We will give him decadron and albuterol. - Patient will be discharged with prescription for his home Symbicort and albuterol and instructions to follow up with his PCP.  Final Clinical Impressions(s) / ED Diagnoses   Final diagnoses:  COPD mixed type ([place])  Asthma,  unspecified asthma severity, unspecified whether complicated, unspecified whether persistent    ED Discharge Orders        Ordered    albuterol (PROVENTIL HFA;VENTOLIN HFA) 108 (90 Base) MCG/ACT inhaler  Every 6 hours PRN     09/08/17 2237    budesonide-formoterol (SYMBICORT) 160-4.5 MCG/ACT inhaler  2 times daily     09/08/17 2237       Neva Seat, MD 09/08/17 2238    Neva Seat, MD 09/08/17 2240    Merrily Pew, MD 09/09/17 [phone]

## 2018-03-17 ENCOUNTER — Encounter (HOSPITAL_COMMUNITY): Payer: Self-pay | Admitting: *Deleted

## 2018-03-17 ENCOUNTER — Other Ambulatory Visit: Payer: Self-pay

## 2018-03-17 ENCOUNTER — Emergency Department (HOSPITAL_COMMUNITY)
Admission: EM | Admit: 2018-03-17 | Discharge: 2018-03-17 | Disposition: A | Discharge status: Home / Self Care | Payer: Medicare HMO | Attending: Emergency Medicine | Admitting: Emergency Medicine

## 2018-03-17 DIAGNOSIS — I10 Essential (primary) hypertension: Secondary | ICD-10-CM | POA: Insufficient documentation

## 2018-03-17 DIAGNOSIS — L0201 Cutaneous abscess of face: Secondary | ICD-10-CM

## 2018-03-17 DIAGNOSIS — Z79899 Other long term (current) drug therapy: Secondary | ICD-10-CM | POA: Diagnosis not present

## 2018-03-17 DIAGNOSIS — R220 Localized swelling, mass and lump, head: Secondary | ICD-10-CM | POA: Diagnosis present

## 2018-03-17 DIAGNOSIS — F17200 Nicotine dependence, unspecified, uncomplicated: Secondary | ICD-10-CM | POA: Diagnosis not present

## 2018-03-17 DIAGNOSIS — J449 Chronic obstructive pulmonary disease, unspecified: Secondary | ICD-10-CM | POA: Insufficient documentation

## 2018-03-17 MED ORDER — DOXYCYCLINE HYCLATE 100 MG PO TABS
100.0000 mg | ORAL_TABLET | Freq: Once | ORAL | Status: AC
  Administered 2018-03-17: 100 mg via ORAL
  Filled 2018-03-17: qty 1

## 2018-03-17 MED ORDER — DOXYCYCLINE HYCLATE 100 MG PO CAPS: 100.0000 mg | ORAL_CAPSULE | Freq: Two times a day (BID) | ORAL | 0 refills | Status: DC

## 2018-03-17 NOTE — ED Triage Notes (Signed)
The pt is c/o a stye on his rt eye and allergies with some chest congestion  Cough cold asthma copd  He still smokes

## 2018-03-17 NOTE — ED Provider Notes (Signed)
[place] EMERGENCY DEPARTMENT Provider Note   CSN: [id number] Arrival date & time: 03/17/18  4268     History   Chief Complaint Chief Complaint  Patient presents with  . Eye Problem  . allergies    HPI Robert Chan is a 62 y.o. male.  HPI 62 year old male presenting to the emergency department with small abscess of his right periorbital region this been present for several days.  He has small amount of drainage today.  No fevers or chills.  No spreading erythema.  No involvement of his eye itself.  Symptoms are mild in severity.  History of similar symptoms before requiring incision and drainage and IV antibiotics.   Past Medical History:  Diagnosis Date  . Asthma   . Cataract   . COPD (chronic obstructive pulmonary disease) ([place])   . Hypertension     There are no active problems to display for this patient.   Past Surgical History:  Procedure Laterality Date  . APPENDECTOMY    . EYE SURGERY    . PROSTATE SURGERY          Home Medications    Prior to Admission medications   Medication Sig Start Date End Date Taking? Authorizing Provider  albuterol (PROVENTIL HFA;VENTOLIN HFA) 108 (90 Base) MCG/ACT inhaler Inhale 1-2 puffs every 6 (six) hours as needed into the lungs for wheezing or shortness of breath. 09/06/17   Tasia Catchings, Amy V, PA-C  albuterol (PROVENTIL HFA;VENTOLIN HFA) 108 (90 Base) MCG/ACT inhaler Inhale 1-2 puffs every 6 (six) hours as needed into the lungs for wheezing or shortness of breath. 09/08/17   Neva Seat, MD  budesonide-formoterol [facility]) 160-4.5 MCG/ACT inhaler Inhale 2 puffs 2 (two) times daily into the lungs. 09/06/17   Tasia Catchings, Amy V, PA-C  budesonide-formoterol (SYMBICORT) 160-4.5 MCG/ACT inhaler Inhale 2 puffs 2 (two) times daily into the lungs. 09/08/17   Neva Seat, MD  clotrimazole (LOTRIMIN) 1 % cream Apply to affected area 2 times daily 08/18/17   Charlann Lange, PA-C  doxycycline (VIBRAMYCIN) 100 MG capsule  Take 1 capsule (100 mg total) by mouth 2 (two) times daily. 03/17/18   Jola Schmidt, MD  HYDROcodone-acetaminophen (NORCO) 5-325 MG tablet Take 1-2 tablets by mouth every 6 (six) hours as needed. Patient not taking: Reported on 08/18/2017 09/26/16   Margarita Mail, PA-C  predniSONE (DELTASONE) 20 MG tablet Take 3 tablets (60 mg total) by mouth daily. Patient not taking: Reported on 09/08/2017 08/18/17   Charlann Lange, PA-C    Family History No family history on file.  Social History Social History   Tobacco Use  . Smoking status: Current Some Day Smoker  . Smokeless tobacco: Never Used  Substance Use Topics  . Alcohol use: Yes  . Drug use: No     Allergies   Patient has no known allergies.   Review of Systems Review of Systems  All other systems reviewed and are negative.    Physical Exam Updated Vital Signs BP (!) 151/85   Pulse 85   Temp 98.7 F (37.1 C) (Oral)   Resp 20   Ht 6\' 4"  (1.93 m)   Wt 108.9 kg (240 lb)   SpO2 97%   BMI 29.21 kg/m   Physical Exam  Constitutional: He is oriented to person, place, and time. He appears well-developed and well-nourished.  HENT:  Head: Normocephalic.  Small nondraining abscess of the medial aspect of his right periorbital region just superior to the medial canthal ligament.  No eyelid involvement.  No involvement of the eyelashes.  Medial canthal region itself otherwise is unremarkable  Eyes: EOM are normal.  Neck: Normal range of motion.  Pulmonary/Chest: Effort normal.  Abdominal: He exhibits no distension.  Musculoskeletal: Normal range of motion.  Neurological: He is alert and oriented to person, place, and time.  Psychiatric: He has a normal mood and affect.  Nursing note and vitals reviewed.    ED Treatments / Results  Labs (all labs ordered are listed, but only abnormal results are displayed) Labs Reviewed - No data to display  EKG None  Radiology No results found.  Procedures .Marland KitchenIncision and  Drainage Performed by: Jola Schmidt, MD Authorized by: Jola Schmidt, MD     INCISION AND DRAINAGE Performed by: Jola Schmidt Consent: Verbal consent obtained. Risks and benefits: risks, benefits and alternatives were discussed Time out performed prior to procedure Type: abscess Body area: right periorbital region Anesthesia: local infiltration Incision was made with a scalpel. Local anesthetic: none Simple Drainage: purulent Drainage amount: small Packing material: none Patient tolerance: Patient tolerated the procedure well with no immediate complications.     Medications Ordered in ED Medications  doxycycline (VIBRA-TABS) tablet 100 mg (has no administration in time range)     Initial Impression / Assessment and Plan / ED Course  I have reviewed the triage vital signs and the nursing notes.  Pertinent labs & imaging results that were available during my care of the patient were reviewed by me and considered in my medical decision making (see chart for details).     Overall well-appearing.  Abscess was I&D to the bedside.  No significant surrounding erythema.  No swelling of his eyelids itself.  No indication for IV antibiotics.  I think he is stable for discharge home on oral antibiotics.  Patient understands return to the emergency department for new or worsening symptoms    Final Clinical Impressions(s) / ED Diagnoses   Final diagnoses:  Facial abscess    ED Discharge Orders        Ordered    doxycycline (VIBRAMYCIN) 100 MG capsule  2 times daily     03/17/18 1226       Jola Schmidt, MD 03/17/18 1229

## 2018-03-17 NOTE — ED Notes (Signed)
Edp aware of bp  

## 2018-04-01 DIAGNOSIS — F1020 Alcohol dependence, uncomplicated: Secondary | ICD-10-CM | POA: Insufficient documentation

## 2018-12-31 ENCOUNTER — Other Ambulatory Visit: Payer: Self-pay | Admitting: Internal Medicine

## 2019-08-28 DIAGNOSIS — R7989 Other specified abnormal findings of blood chemistry: Secondary | ICD-10-CM

## 2019-08-28 HISTORY — DX: Other specified abnormal findings of blood chemistry: R79.89

## 2019-09-23 ENCOUNTER — Encounter (HOSPITAL_COMMUNITY): Payer: Self-pay | Admitting: *Deleted

## 2019-09-23 ENCOUNTER — Emergency Department (HOSPITAL_COMMUNITY)
Admission: EM | Admit: 2019-09-23 | Discharge: 2019-09-24 | Disposition: A | Discharge status: Other Acute Inpatient Hospital | Payer: Medicare HMO | Attending: Emergency Medicine | Admitting: Emergency Medicine

## 2019-09-23 ENCOUNTER — Other Ambulatory Visit: Payer: Self-pay

## 2019-09-23 ENCOUNTER — Emergency Department (HOSPITAL_COMMUNITY): Payer: Medicare HMO

## 2019-09-23 DIAGNOSIS — I10 Essential (primary) hypertension: Secondary | ICD-10-CM | POA: Insufficient documentation

## 2019-09-23 DIAGNOSIS — Z720 Tobacco use: Secondary | ICD-10-CM | POA: Diagnosis not present

## 2019-09-23 DIAGNOSIS — F332 Major depressive disorder, recurrent severe without psychotic features: Secondary | ICD-10-CM | POA: Diagnosis not present

## 2019-09-23 DIAGNOSIS — R45851 Suicidal ideations: Secondary | ICD-10-CM | POA: Diagnosis not present

## 2019-09-23 DIAGNOSIS — F1490 Cocaine use, unspecified, uncomplicated: Secondary | ICD-10-CM | POA: Insufficient documentation

## 2019-09-23 DIAGNOSIS — R0789 Other chest pain: Secondary | ICD-10-CM | POA: Insufficient documentation

## 2019-09-23 DIAGNOSIS — J449 Chronic obstructive pulmonary disease, unspecified: Secondary | ICD-10-CM | POA: Insufficient documentation

## 2019-09-23 DIAGNOSIS — F329 Major depressive disorder, single episode, unspecified: Secondary | ICD-10-CM | POA: Insufficient documentation

## 2019-09-23 DIAGNOSIS — R079 Chest pain, unspecified: Secondary | ICD-10-CM

## 2019-09-23 DIAGNOSIS — Z20828 Contact with and (suspected) exposure to other viral communicable diseases: Secondary | ICD-10-CM | POA: Diagnosis not present

## 2019-09-23 LAB — BASIC METABOLIC PANEL
Anion gap: 8 (ref 5–15)
BUN: 14 mg/dL (ref 8–23)
CO2: 24 mmol/L (ref 22–32)
Calcium: 9.2 mg/dL (ref 8.9–10.3)
Chloride: 108 mmol/L (ref 98–111)
Creatinine, Ser: 1.25 mg/dL — ABNORMAL HIGH (ref 0.61–1.24)
GFR calc Af Amer: >60 mL/min (ref >60)
GFR calc non Af Amer: >60 mL/min (ref >60)
Glucose, Bld: 123 mg/dL — ABNORMAL HIGH (ref 70–99)
Potassium: 4.1 mmol/L (ref 3.5–5.1)
Sodium: 140 mmol/L (ref 135–145)

## 2019-09-23 LAB — CBC
HCT: 39.7 % (ref 39.0–52.0)
Hemoglobin: 13.1 g/dL (ref 13.0–17.0)
MCH: 31 pg (ref 26.0–34.0)
MCHC: 33 g/dL (ref 30.0–36.0)
MCV: 94.1 fL (ref 80.0–100.0)
Platelets: 263 10*3/uL (ref 150–400)
RBC: 4.22 MIL/uL (ref 4.22–5.81)
RDW: 11.9 % (ref 11.5–15.5)
WBC: 8.8 10*3/uL (ref 4.0–10.5)
nRBC: 0 % (ref 0.0–0.2)

## 2019-09-23 LAB — RAPID URINE DRUG SCREEN, HOSP PERFORMED
Amphetamines: NOT DETECTED
Barbiturates: NOT DETECTED
Benzodiazepines: NOT DETECTED
Cocaine: POSITIVE — AB
Opiates: NOT DETECTED
Tetrahydrocannabinol: NOT DETECTED

## 2019-09-23 LAB — TROPONIN I (HIGH SENSITIVITY)
Troponin I (High Sensitivity): 10 ng/L (ref <18)
Troponin I (High Sensitivity): 10 ng/L (ref <18)

## 2019-09-23 MED ORDER — ACETAMINOPHEN 325 MG PO TABS: 650.0000 mg | ORAL_TABLET | Freq: Four times a day (QID) | ORAL | Status: DC | PRN

## 2019-09-23 MED ORDER — SUCRALFATE 1 GM/10ML PO SUSP
1.0000 g | Freq: Once | ORAL | Status: DC
  Filled 2019-09-23: qty 10

## 2019-09-23 MED ORDER — SODIUM CHLORIDE 0.9% FLUSH: 3.0000 mL | Freq: Once | INTRAVENOUS | Status: DC

## 2019-09-23 MED ORDER — METOPROLOL TARTRATE 25 MG PO TABS
25.0000 mg | ORAL_TABLET | Freq: Once | ORAL | Status: AC
  Administered 2019-09-23: 25 mg via ORAL
  Filled 2019-09-23: qty 1

## 2019-09-23 MED ORDER — ACETAMINOPHEN 500 MG PO TABS: 1000.0000 mg | ORAL_TABLET | Freq: Four times a day (QID) | ORAL | Status: DC | PRN

## 2019-09-23 MED ORDER — MOMETASONE FURO-FORMOTEROL FUM 200-5 MCG/ACT IN AERO
2.0000 | INHALATION_SPRAY | Freq: Two times a day (BID) | RESPIRATORY_TRACT | Status: DC
  Filled 2019-09-23: qty 8.8

## 2019-09-23 NOTE — ED Triage Notes (Signed)
The pt is c/o chest pain and some sob  He has copd and asthma  Hr also reports that hes depressed

## 2019-09-23 NOTE — ED Provider Notes (Signed)
[place] EMERGENCY DEPARTMENT Provider Note   CSN: [id number] Arrival date & time: 09/23/19  1703     History   Chief Complaint Chief Complaint  Patient presents with  . Chest Pain  . Suicidal    HPI Robert Chan is a 63 y.o. male.     HPI  Patient is a 63 year old male past medical history of COPD, asthma, hypertension who presents to the ED with concern for chest pain and depression. Patient endorses on and off chest pain that is not new.  Admittedly was recently admitted for similar.  Patient describes the pain as a pressure sensation.  Of note, patient does endorse using cocaine today.  Not used any in the last few days.  No shortness of breath.  No fever cough or recent illness.  Patient has not tried anything for the pain.  Patient appears more concerned with thoughts of harming himself recently.  He states he has lost many loved ones due to COVID-19 and is under stress related to this pandemic.  Patient reports he is having thoughts of shooting himself.  He states he does have access to a gun.   Past Medical History:  Diagnosis Date  . Asthma   . Cataract   . COPD (chronic obstructive pulmonary disease) ([place])   . Hypertension     Patient Active Problem List   Diagnosis Date Noted  . 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 APPENDECTOMY    . EYE SURGERY    . PROSTATE SURGERY          Home Medications    Prior to Admission medications   Medication Sig Start Date End Date Taking? Authorizing Provider  albuterol (PROVENTIL HFA;VENTOLIN HFA) 108 (90 Base) MCG/ACT inhaler Inhale 1-2 puffs every 6 (six) hours as needed into the lungs for wheezing or shortness of breath. 09/06/17  Yes Yu, Amy V, PA-C  budesonide-formoterol (SYMBICORT) 160-4.5 MCG/ACT inhaler Inhale 2 puffs 2 (two) times daily into the lungs. 09/06/17  Yes Yu, Amy V, PA-C  albuterol  (PROVENTIL HFA;VENTOLIN HFA) 108 (90 Base) MCG/ACT inhaler Inhale 1-2 puffs every 6 (six) hours as needed into the lungs for wheezing or shortness of breath. Patient not taking: Reported on 09/23/2019 09/08/17   Neva Seat, MD  amoxicillin-clavulanate (AUGMENTIN) 875-125 MG tablet Take 1 tablet by mouth 2 (two) times daily. 09/12/19   [provider]  budesonide-formoterol (SYMBICORT) 160-4.5 MCG/ACT inhaler Inhale 2 puffs 2 (two) times daily into the lungs. Patient not taking: Reported on 09/23/2019 09/08/17   Neva Seat, MD  clotrimazole (LOTRIMIN) 1 % cream Apply to affected area 2 times daily Patient not taking: Reported on 09/23/2019 08/18/17   Charlann Lange, PA-C  erythromycin ophthalmic ointment See admin instructions. Place a 1/2 inch ribbon of ointment into the lower eyelid. Use every 6 hours for one week 09/12/19   [provider]    Family History No family history on file.  Social History Social History   Tobacco Use  . Smoking status: Current Some Day Smoker  . Smokeless tobacco: Never Used  Substance Use Topics  . Alcohol use: Yes  . Drug use: No     Allergies   Patient has no known allergies.   Review of Systems Review of Systems  Constitutional: Negative for chills and fever.  HENT: Negative for ear pain and sore throat.   Eyes: Negative for pain and visual  disturbance.  Respiratory: Positive for chest tightness. Negative for cough and shortness of breath.   Cardiovascular: Positive for chest pain. Negative for palpitations.  Gastrointestinal: Negative for abdominal pain and vomiting.  Genitourinary: Negative for dysuria and hematuria.  Musculoskeletal: Negative for arthralgias and back pain.  Skin: Negative for color change and rash.  Neurological: Negative for seizures and syncope.  Psychiatric/Behavioral: Positive for sleep disturbance and suicidal ideas. The patient is nervous/anxious.   All other systems reviewed and are  negative.    Physical Exam Updated Vital Signs BP (!) 147/92   Pulse 72   Temp 98.9 F (37.2 C) (Oral)   Resp 15   Ht 6\' 4"  (1.93 m)   Wt 108.9 kg   SpO2 97%   BMI 29.22 kg/m   Physical Exam Vitals signs and nursing note reviewed.  Constitutional:      Appearance: He is well-developed.  HENT:     Head: Normocephalic and atraumatic.  Eyes:     Extraocular Movements: Extraocular movements intact.     Conjunctiva/sclera: Conjunctivae normal.  Neck:     Musculoskeletal: Neck supple.  Cardiovascular:     Rate and Rhythm: Normal rate and regular rhythm.     Heart sounds: No murmur.  Pulmonary:     Effort: Pulmonary effort is normal. No respiratory distress.     Breath sounds: Normal breath sounds.  Abdominal:     Palpations: Abdomen is soft.     Tenderness: There is no abdominal tenderness.  Skin:    General: Skin is warm and dry.  Neurological:     General: No focal deficit present.     Mental Status: He is alert and oriented to person, place, and time.  Psychiatric:        Mood and Affect: Mood normal.        Behavior: Behavior normal.      ED Treatments / Results  Labs (all labs ordered are listed, but only abnormal results are displayed) Labs Reviewed  BASIC METABOLIC PANEL - Abnormal; Notable for the following components:      Result Value   Glucose, Bld 123 (*)    Creatinine, Ser 1.25 (*)    All other components within normal limits  RAPID URINE DRUG SCREEN, HOSP PERFORMED - Abnormal; Notable for the following components:   Cocaine POSITIVE (*)    All other components within normal limits  SARS CORONAVIRUS 2 BY RT PCR (HOSPITAL ORDER, [place] [facility])  CBC  TROPONIN I (HIGH SENSITIVITY)  TROPONIN I (HIGH SENSITIVITY)    EKG EKG Interpretation  Date/Time:  Sunday September 23 2019 17:14:01 EST Ventricular Rate:  82 PR Interval:  174 QRS Duration: 84 QT Interval:  380 QTC Calculation: 443 R Axis:   89 Text Interpretation:  Normal sinus rhythm Normal ECG Confirmed by Ripley Fraise [phone]) on 09/24/2019 7:52:29 AM   Radiology Dg Chest 2 View  Result Date: 09/23/2019 CLINICAL DATA:  Chest pain, shortness of breath EXAM: CHEST - 2 VIEW COMPARISON:  09/08/2017 FINDINGS: The heart size and mediastinal contours are within normal limits. Both lungs are clear. The visualized skeletal structures are unremarkable. IMPRESSION: No active cardiopulmonary disease. Electronically Signed   By: Davina Poke M.D.   On: 09/23/2019 17:52    Procedures Procedures (including critical care time)  Medications Ordered in ED Medications  metoprolol tartrate (LOPRESSOR) tablet 25 mg (25 mg Oral Given 09/23/19 2221)     Initial Impression / Assessment and Plan / ED Course  I have reviewed the triage vital signs and the nursing notes.  Pertinent labs & imaging results that were available during my care of the patient were reviewed by me and considered in my medical decision making (see chart for details).       On arrival, pt is well appearing, hypertensive. No active chest pain  Regarding chest pain, considered: ACS, stable angina, chest pain induced by cocaine use/vasospasm, anxiety  EKG: NSR, no obvious ischemic findings, appears similar when compared to prior  Initial troponin negative CXR: no acute findings   Per chart review, patient had a negative NM stress test on 08/29/19. Repeat troponin negative, no active chest pain, likely stable angina, doubt ACS.  Regarding depression and SI, psychiatry consulted. They recommend inpatient admission. Pt's home metoprolol from recent admission was ordered as he is hypertensive and has not been taking it at home.   Psych hold placed. No further acute events.   Final Clinical Impressions(s) / ED Diagnoses   Final diagnoses:  Suicidal ideation  Chest pain, unspecified type    ED Discharge Orders    None       Burns Spain, MD 09/24/19 1327    Elnora Morrison,  MD 09/26/19 [phone]
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
## 2019-09-24 ENCOUNTER — Other Ambulatory Visit: Payer: Self-pay

## 2019-09-24 ENCOUNTER — Inpatient Hospital Stay (HOSPITAL_COMMUNITY)
Admission: AD | Admit: 2019-09-24 | Discharge: 2019-10-01 | DRG: 885 | Disposition: A | Discharge status: Home / Self Care | Payer: Medicare HMO | Source: Intra-hospital | Attending: Psychiatry | Admitting: Psychiatry

## 2019-09-24 ENCOUNTER — Encounter (HOSPITAL_COMMUNITY): Payer: Self-pay | Admitting: *Deleted

## 2019-09-24 DIAGNOSIS — J449 Chronic obstructive pulmonary disease, unspecified: Secondary | ICD-10-CM | POA: Diagnosis present

## 2019-09-24 DIAGNOSIS — Z915 Personal history of self-harm: Secondary | ICD-10-CM

## 2019-09-24 DIAGNOSIS — I129 Hypertensive chronic kidney disease with stage 1 through stage 4 chronic kidney disease, or unspecified chronic kidney disease: Secondary | ICD-10-CM | POA: Diagnosis present

## 2019-09-24 DIAGNOSIS — F332 Major depressive disorder, recurrent severe without psychotic features: Secondary | ICD-10-CM | POA: Diagnosis present

## 2019-09-24 DIAGNOSIS — F1410 Cocaine abuse, uncomplicated: Secondary | ICD-10-CM

## 2019-09-24 DIAGNOSIS — F1010 Alcohol abuse, uncomplicated: Secondary | ICD-10-CM | POA: Diagnosis present

## 2019-09-24 DIAGNOSIS — Z20828 Contact with and (suspected) exposure to other viral communicable diseases: Secondary | ICD-10-CM | POA: Diagnosis present

## 2019-09-24 DIAGNOSIS — Z818 Family history of other mental and behavioral disorders: Secondary | ICD-10-CM | POA: Diagnosis not present

## 2019-09-24 DIAGNOSIS — F17200 Nicotine dependence, unspecified, uncomplicated: Secondary | ICD-10-CM | POA: Diagnosis present

## 2019-09-24 DIAGNOSIS — N189 Chronic kidney disease, unspecified: Secondary | ICD-10-CM | POA: Diagnosis present

## 2019-09-24 DIAGNOSIS — R440 Auditory hallucinations: Secondary | ICD-10-CM | POA: Diagnosis present

## 2019-09-24 DIAGNOSIS — F1414 Cocaine abuse with cocaine-induced mood disorder: Secondary | ICD-10-CM | POA: Diagnosis not present

## 2019-09-24 DIAGNOSIS — R45851 Suicidal ideations: Secondary | ICD-10-CM | POA: Diagnosis present

## 2019-09-24 DIAGNOSIS — G4700 Insomnia, unspecified: Secondary | ICD-10-CM | POA: Diagnosis present

## 2019-09-24 DIAGNOSIS — I10 Essential (primary) hypertension: Secondary | ICD-10-CM | POA: Diagnosis not present

## 2019-09-24 LAB — SARS CORONAVIRUS 2 BY RT PCR (HOSPITAL ORDER, PERFORMED IN ~~LOC~~ HOSPITAL LAB): SARS Coronavirus 2: NEGATIVE

## 2019-09-24 MED ORDER — LORAZEPAM 1 MG PO TABS: 1.0000 mg | ORAL_TABLET | Freq: Four times a day (QID) | ORAL | Status: AC | PRN

## 2019-09-24 MED ORDER — ALUM & MAG HYDROXIDE-SIMETH 200-200-20 MG/5ML PO SUSP: 30.0000 mL | ORAL | Status: DC | PRN

## 2019-09-24 MED ORDER — MOMETASONE FURO-FORMOTEROL FUM 200-5 MCG/ACT IN AERO
2.0000 | INHALATION_SPRAY | Freq: Two times a day (BID) | RESPIRATORY_TRACT | Status: DC
  Administered 2019-09-24 – 2019-10-01 (×14): 2 via RESPIRATORY_TRACT
  Filled 2019-09-24: qty 8.8

## 2019-09-24 MED ORDER — ALBUTEROL SULFATE HFA 108 (90 BASE) MCG/ACT IN AERS: 1.0000 | INHALATION_SPRAY | Freq: Four times a day (QID) | RESPIRATORY_TRACT | Status: DC | PRN

## 2019-09-24 MED ORDER — HYDROXYZINE HCL 25 MG PO TABS: 25.0000 mg | ORAL_TABLET | Freq: Three times a day (TID) | ORAL | Status: DC | PRN

## 2019-09-24 MED ORDER — ADULT MULTIVITAMIN W/MINERALS CH
1.0000 | ORAL_TABLET | Freq: Every day | ORAL | Status: DC
  Administered 2019-09-24 – 2019-10-01 (×8): 1 via ORAL
  Filled 2019-09-24 (×14): qty 1

## 2019-09-24 MED ORDER — ONDANSETRON 4 MG PO TBDP: 4.0000 mg | ORAL_TABLET | Freq: Four times a day (QID) | ORAL | Status: DC | PRN

## 2019-09-24 MED ORDER — METOPROLOL TARTRATE 25 MG PO TABS
12.5000 mg | ORAL_TABLET | Freq: Two times a day (BID) | ORAL | Status: DC
  Administered 2019-09-24 – 2019-10-01 (×14): 12.5 mg via ORAL
  Filled 2019-09-24 (×2): qty 0.5
  Filled 2019-09-24: qty 1
  Filled 2019-09-24 (×6): qty 0.5
  Filled 2019-09-24 (×3): qty 1
  Filled 2019-09-24: qty 0.5
  Filled 2019-09-24: qty 1
  Filled 2019-09-24 (×4): qty 0.5
  Filled 2019-09-24: qty 1
  Filled 2019-09-24 (×3): qty 0.5

## 2019-09-24 MED ORDER — MIRTAZAPINE 7.5 MG PO TABS
7.5000 mg | ORAL_TABLET | Freq: Every day | ORAL | Status: DC
  Administered 2019-09-25: 7.5 mg via ORAL
  Filled 2019-09-24 (×5): qty 1

## 2019-09-24 MED ORDER — HYDROXYZINE HCL 25 MG PO TABS
25.0000 mg | ORAL_TABLET | Freq: Four times a day (QID) | ORAL | Status: AC | PRN
  Administered 2019-09-26: 25 mg via ORAL
  Filled 2019-09-24: qty 1

## 2019-09-24 MED ORDER — ACETAMINOPHEN 325 MG PO TABS
650.0000 mg | ORAL_TABLET | Freq: Four times a day (QID) | ORAL | Status: DC | PRN
  Administered 2019-09-26 – 2019-09-27 (×3): 650 mg via ORAL
  Filled 2019-09-24 (×3): qty 2

## 2019-09-24 MED ORDER — LOPERAMIDE HCL 2 MG PO CAPS: 2.0000 mg | ORAL_CAPSULE | ORAL | Status: DC | PRN

## 2019-09-24 MED ORDER — MAGNESIUM HYDROXIDE 400 MG/5ML PO SUSP: 30.0000 mL | Freq: Every day | ORAL | Status: DC | PRN

## 2019-09-24 MED ORDER — THIAMINE HCL 100 MG/ML IJ SOLN: 100.0000 mg | Freq: Once | INTRAMUSCULAR | Status: DC

## 2019-09-24 MED ORDER — ARIPIPRAZOLE 2 MG PO TABS
2.0000 mg | ORAL_TABLET | Freq: Every day | ORAL | Status: DC
  Administered 2019-09-24 – 2019-10-01 (×8): 2 mg via ORAL
  Filled 2019-09-24 (×13): qty 1

## 2019-09-24 MED ORDER — VITAMIN B-1 100 MG PO TABS
100.0000 mg | ORAL_TABLET | Freq: Every day | ORAL | Status: DC
  Administered 2019-09-25 – 2019-10-01 (×7): 100 mg via ORAL
  Filled 2019-09-24 (×11): qty 1

## 2019-09-24 NOTE — Progress Notes (Signed)
Patient complained of burning sensation when she urinated.   UA in lab refrigerator for pick up.

## 2019-09-24 NOTE — Progress Notes (Signed)
Nutrition Brief Note  Patient identified on the Malnutrition Screening Tool (MST) Report  No significant weight loss noted in weight history.   Wt Readings from Last 15 Encounters:  09/24/19 108.9 kg  09/23/19 108.9 kg  03/17/18 108.9 kg  09/08/17 111.1 kg  08/18/17 104.3 kg  09/26/16 113.4 kg    Body mass index is 29.22 kg/m. Patient meets criteria for overweight based on current BMI.   Labs and medications reviewed.   No nutrition interventions warranted at this time. If nutrition issues arise, please consult RD.   Clayton Bibles, MS, RD, LDN Inpatient Clinical Dietitian Pager: [phone] After Hours Pager: [phone]

## 2019-09-24 NOTE — BHH Group Notes (Signed)
LCSW Group Therapy Note 09/24/2019 3:11 PM  Type of Therapy and Topic: Group Therapy: Overcoming Obstacles  Participation Level: Active  Description of Group:  In this group patients will be encouraged to explore what they see as obstacles to their own wellness and recovery. They will be guided to discuss their thoughts, feelings, and behaviors related to these obstacles. The group will process together ways to cope with barriers, with attention given to specific choices patients can make. Each patient will be challenged to identify changes they are motivated to make in order to overcome their obstacles. This group will be process-oriented, with patients participating in exploration of their own experiences as well as giving and receiving support and challenge from other group members.  Therapeutic Goals: 1. Patient will identify personal and current obstacles as they relate to admission. 2. Patient will identify barriers that currently interfere with their wellness or overcoming obstacles.  3. Patient will identify feelings, thought process and behaviors related to these barriers. 4. Patient will identify two changes they are willing to make to overcome these obstacles:   Summary of Patient Progress  Robert Chan was engaged and participated throughout the group session. Robert Chan states that his main obstacle is "having to start over". He states that since he left his sister in [place], [place] he has had to "start over" in finding a new home and new employment. He reports that he plans to work on a plan while in the hospital.    Therapeutic Modalities:  Cognitive Behavioral Therapy Solution Focused Therapy Motivational Interviewing Relapse Prevention Therapy   [place] Social Worker

## 2019-09-24 NOTE — Tx Team (Signed)
Initial Treatment Plan 09/24/2019 6:50 AM [name] [id number]    PATIENT STRESSORS: Financial difficulties Marital or family conflict Occupational concerns   PATIENT STRENGTHS: Average or above average intelligence Capable of independent living Motivation for treatment/growth Work skills   PATIENT IDENTIFIED PROBLEMS: Depression  Suicidal Ideation          "get stable housing"  "get back on my feet"       DISCHARGE CRITERIA:  Adequate post-discharge living arrangements Improved stabilization in mood, thinking, and/or behavior Motivation to continue treatment in a less acute level of care Need for constant or close observation no longer present Verbal commitment to aftercare and medication compliance  PRELIMINARY DISCHARGE PLAN: Outpatient therapy Placement in alternative living arrangements  PATIENT/FAMILY INVOLVEMENT: This treatment plan has been presented to and reviewed with the patient, Robert Chan.  The patient and family have been given the opportunity to ask questions and make suggestions.  Margaretann Loveless, RN 09/24/2019, 6:50 AM
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
## 2019-09-24 NOTE — BHH Suicide Risk Assessment (Addendum)
[facility] Admission Suicide Risk Assessment   Nursing information obtained from:  Patient Demographic factors:  Male, Divorced or widowed, Unemployed, Living alone, Low socioeconomic status Current Mental Status:  Suicidal ideation indicated by patient, Suicidal ideation indicated by others Loss Factors:  Decrease in vocational status, Loss of significant relationship, Financial problems / change in socioeconomic status Historical Factors:  Family history of suicide, Family history of mental illness or substance abuse, Impulsivity Risk Reduction Factors:  Sense of responsibility to family  Total Time spent with patient: 45 minutes Principal Problem: Depression Diagnosis:  Active Problems:   Severe recurrent major depression without psychotic features ([place])  Subjective Data:   Continued Clinical Symptoms:  Alcohol Use Disorder Identification Test Final Score (AUDIT): 4 The "Alcohol Use Disorders Identification Test", Guidelines for Use in Primary Care, Second Edition.  World Pharmacologist [facility]). Score between 0-7:  no or low risk or alcohol related problems. Score between 8-15:  moderate risk of alcohol related problems. Score between 16-19:  high risk of alcohol related problems. Score 20 or above:  warrants further diagnostic evaluation for alcohol dependence and treatment.   CLINICAL FACTORS:  63 year old male.  Separated.  On disability.  Reports he has been staying with daughter or a  friend recently  Presented to ED on 11/22 reporting chest pain.  Work-up was negative for cardiac event (troponin within normal, EKG unremarkable).  He also endorsed depression and suicidal ideations, with thoughts of shooting himself.  Describes neurovegetative symptoms to include a subjective sense of anhedonia, low energy, decreased sleep, decreased appetite.  Describes vague auditory hallucinations of a demeaning nature but expresses insight that these are his own thoughts.  Currently does not appear  internally preoccupied.  Endorses loss of loved ones secondary to Covid (one of his sisters passed away about 2 months ago, other members of his extended family passed away earlier this year). He also describes unstable housing as an added stressor.  He reports drinking 40 to 80 ounces of beer on most days.  No recent BAL in chart.  Admission UDS positive for cocaine. Was not taking psychiatric medications prior to admission.  Reports history of prior psychiatric admissions for depression.  Reports recent ED visit  in [place] several weeks ago.  Describes a prior episode of suicide attempt by overdosing several years ago, and a remote history of shooting self on his hand.  Reports history of "on and off" suicidal ideations. " Care Everywhere" review indicated brief medical admission in [place] for cardiac workup. At the time requested to see psychiatric consultant for depression and was started on Zoloft/Abilify.  Medical history remarkable for COPD.  Currently smokes about 2 to 3 cigarettes/day.  NKDA. Describes a family history of mental illness.  Reports 2 of his brothers committed suicide after returning from [place] and that another brother died from complications of alcohol abuse.  Dx- MDD, consider with psychotic features versus Substance Induced Mood Disorder .   Plan-inpatient admission.  Will start Ativan as needed as per CIWA protocol for alcohol withdrawal symptoms as needed. Agrees to Remeron trial for depression/ insomnia. Will also restart low dose Abilify for augmentation and vague auditory hallucinations . Side effects reviewed . Remeron 7.5 mgrs QHS initially Abilify 2 mgrs QDAY initially , titrate as tolerated  * Chart review indicates was also  being prescribed Metoprolol 12.5 mgrs BID prior to admission. Reports had not taken recently, denies side effects.    Musculoskeletal: Strength & Muscle Tone: within normal limits Gait & Station:  normal Patient leans:  N/A  Psychiatric Specialty Exam: Physical Exam  ROS endorses frequent subjective sensation of chest tightness, currently denies chest pain.  No shortness of breath at room air.  No cough, no vomiting.  Height 6\' 4"  (1.93 m), weight 108.9 kg.Body mass index is 29.22 kg/m.  General Appearance: Fairly Groomed  Eye Contact:  Fair  Speech:  Normal Rate  Volume:  Normal  Mood:  Depressed, does state he is feeling better today than he did prior to admission  Affect:  Congruent, vaguely anxious, does smile at times appropriately during session  Thought Process:  Linear and Descriptions of Associations: Intact  Orientation:  Other:  Fully alert and attentive  Thought Content:  No hallucinations at this time and does not appear internally preoccupied, no delusions  Suicidal Thoughts:  No currently denies suicidal plan or intention and contracts for safety on unit  Homicidal Thoughts:   denies homicidal or violent ideations  Memory:  Recent and remote grossly intact  Judgement:  Fair  Insight:  Fair  Psychomotor Activity:  Normal currently presents calm, comfortable, no acute distress, no distal tremors or diaphoresis are noted  Concentration:  Concentration: Good and Attention Span: Good  Recall:  Good  Fund of Knowledge:  Good  Language:  Good  Akathisia:  Negative  Handed:  Right  AIMS (if indicated):     Assets:  Communication Skills Desire for Improvement Resilience  ADL's:  Intact  Cognition:  WNL  Sleep:         COGNITIVE FEATURES THAT CONTRIBUTE TO RISK:  Closed-mindedness and Loss of executive function    SUICIDE RISK:   Moderate:  Frequent suicidal ideation with limited intensity, and duration, some specificity in terms of plans, no associated intent, good self-control, limited dysphoria/symptomatology, some risk factors present, and identifiable protective factors, including available and accessible social support.  PLAN OF CARE: Patient will be admitted to inpatient  psychiatric unit for stabilization and safety. Will provide and encourage milieu participation. Provide medication management and maked adjustments as needed.  Will also provide medication management to address possible alcohol WDL- medication management will follow daily.    I certify that inpatient services furnished can reasonably be expected to improve the patient's condition.   Jenne Campus, MD 09/24/2019, 3:33 PM

## 2019-09-24 NOTE — Progress Notes (Signed)
D:  Patient denied SI and HI, contracts for safety.  Denied A/V hallucinations.   A:  Medications administered per MD orders.  Emotional support and encouragement given patient. R:  Safety maintained with 15 minute checks.  

## 2019-09-24 NOTE — Progress Notes (Signed)
Spiritual care group on grief and loss facilitated by chaplain Jerene Pitch MDiv, [facility]  Group Goal:  Support / Education around grief and loss Members engage in facilitated group support and psycho-social education.  Group Description:  Following introductions and group rules, group members engaged in facilitated group dialog and support around topic of loss, with particular support around experiences of loss in their lives. Group Identified types of loss (relationships / self / things) and identified patterns, circumstances, and changes that precipitate losses. Reflected on thoughts / feelings around loss, normalized grief responses, and recognized variety in grief experience.   Group noted Worden's four tasks of grief in discussion.  Group drew on Adlerian / Rogerian, narrative, MI, Patient Progress:  Present throughout group.  Did not engage in group discussion.

## 2019-09-24 NOTE — Progress Notes (Signed)
Admission Note  Pt is a 63 year old AAM admitted to the services of Dr. Parke Poisson for depression and suicidal ideation.  There have been many recent deaths in his family and of four brothers he is the last. His mother died when he was 60 and there were a total of 13 children.    Pt had been in [place] until two months ago and now states that he has to "start all over again".  Pt lists his five granddaughters as his biggest deterrent from suicide.  Pt is cooperative with the admission process and is very tearful.  He does use cocaine but infrequently, and denies alcohol abuse although he does state he drinks "one or two 40s" a day.  Pt shown to room and provided with breakfast.  VS to be obtained on unit.

## 2019-09-25 LAB — TSH: TSH: 2.103 u[IU]/mL (ref 0.350–4.500)

## 2019-09-25 LAB — LIPID PANEL
Cholesterol: 187 mg/dL (ref 0–200)
HDL: 59 mg/dL (ref >40)
LDL Cholesterol: 114 mg/dL — ABNORMAL HIGH (ref 0–99)
Total CHOL/HDL Ratio: 3.2 RATIO
Triglycerides: 69 mg/dL (ref <150)
VLDL: 14 mg/dL (ref 0–40)

## 2019-09-25 LAB — HEMOGLOBIN A1C
Hgb A1c MFr Bld: 5.1 % (ref 4.8–5.6)
Mean Plasma Glucose: 99.67 mg/dL
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
## 2019-09-25 NOTE — BHH Suicide Risk Assessment (Signed)
[place] INPATIENT:  Family/Significant Other Suicide Prevention Education  Suicide Prevention Education:  Patient Refusal for Family/Significant Other Suicide Prevention Education: The patient Robert Chan has refused to provide written consent for family/significant other to be provided Family/Significant Other Suicide Prevention Education during admission and/or prior to discharge.  Physician notified.  SPE reviewed with patient.   Joellen Jersey 09/25/2019, 9:45 AM

## 2019-09-25 NOTE — Plan of Care (Signed)
Nurse discussed anxiety, depression and coping skills with patient.  

## 2019-09-25 NOTE — Progress Notes (Signed)
[facility] MD Progress Note  09/25/2019 5:59 PM Robert Chan  MRN:  [id number] Subjective:Patient reports feeling some improvement compared to admission. At this time denies suicidal ideations and presents future oriented, stating he spoke with CSW today about possible discharge planning and expressing interest in going to a rehab setting at discharge. Currently denies medication side effects. Objective : I have discussed case with treatment team and have met with patient. 63 year old male, presented to ED on 11/22 for chest pain ( was medically cleared) and for worsening depression, suicidal ideations, vague auditory hallucinations. Reported significant recent losses ( death of a sister from [place] 2 months ago, death of other members of his extended family) and unstable housing situation as contributing stressors. Had been drinking 40-80 ounces of beer on most days, UDS (+) for cocaine .  At this time patient presents alert, attentive, calm. Affect remains constricted and  depressed but states he is feeling better than on admission and currently denies suicidal ideations.  No current hallucinations endorsed and does not appear internally preoccupied, no delusions are expressed. He is currently not presenting with significant withdrawal symptoms.  BP 146/77, pulse 68.  No significant distal tremors, no diaphoresis or restlessness. He expresses interest in going to a rehab at discharge. Labs reviewed lipid panel unremarkable, hemoglobin A1c 5.1, TSH 2.10, EKG NSR, QTc 443 No disruptive or agitated behaviors on unit, calm and polite on approach.   Principal Problem: Depression Diagnosis: Active Problems:   Severe recurrent major depression without psychotic features (HCC)   Cocaine use disorder, mild, abuse ([place])  Total Time spent with patient: 20 minutes  Past Psychiatric History:   Past Medical History:  Past Medical History:  Diagnosis Date  . Asthma   . Cataract   . COPD (chronic  obstructive pulmonary disease) ([place])   . Hypertension     Past Surgical History:  Procedure Laterality Date  . APPENDECTOMY    . EYE SURGERY    . PROSTATE SURGERY     Family History: History reviewed. No pertinent family history. Family Psychiatric  History:  Social History:  Social History   Substance and Sexual Activity  Alcohol Use Yes     Social History   Substance and Sexual Activity  Drug Use No    Social History   Socioeconomic History  . Marital status: Single    Spouse name: Not on file  . Number of children: Not on file  . Years of education: Not on file  . Highest education level: Not on file  Occupational History  . Not on file  Social Needs  . Financial resource strain: Not on file  . Food insecurity    Worry: Not on file    Inability: Not on file  . Transportation needs    Medical: Not on file    Non-medical: Not on file  Tobacco Use  . Smoking status: Current Some Day Smoker  . Smokeless tobacco: Never Used  Substance and Sexual Activity  . Alcohol use: Yes  . Drug use: No  . Sexual activity: Not on file  Lifestyle  . Physical activity    Days per week: Not on file    Minutes per session: Not on file  . Stress: Not on file  Relationships  . Social Herbalist on phone: Not on file    Gets together: Not on file    Attends religious service: Not on file    Active member of club or organization:  Not on file    Attends meetings of clubs or organizations: Not on file    Relationship status: Not on file  Other Topics Concern  . Not on file  Social History Narrative  . Not on file   Additional Social History:   Sleep: improving  Appetite:  Fair  Current Medications: Current Facility-Administered Medications  Medication Dose Route Frequency Provider Last Rate Last Dose  . acetaminophen (TYLENOL) tablet 650 mg  650 mg Oral Q6H PRN Rozetta Nunnery, NP      . alum & mag hydroxide-simeth (MAALOX/MYLANTA) 200-200-20 MG/5ML  suspension 30 mL  30 mL Oral Q4H PRN Lindon Romp A, NP      . ARIPiprazole (ABILIFY) tablet 2 mg  2 mg Oral Daily Luisenrique Conran, Myer Peer, MD   2 mg at 09/25/19 4627  . hydrOXYzine (ATARAX/VISTARIL) tablet 25 mg  25 mg Oral Q6H PRN Ladawna Walgren, Myer Peer, MD      . LORazepam (ATIVAN) tablet 1 mg  1 mg Oral Q6H PRN Deyana Wnuk, Myer Peer, MD      . magnesium hydroxide (MILK OF MAGNESIA) suspension 30 mL  30 mL Oral Daily PRN Lindon Romp A, NP      . metoprolol tartrate (LOPRESSOR) tablet 12.5 mg  12.5 mg Oral BID Dshawn Mcnay, Myer Peer, MD   12.5 mg at 09/25/19 1635  . mirtazapine (REMERON) tablet 7.5 mg  7.5 mg Oral QHS Markisha Meding, Myer Peer, MD      . mometasone-formoterol (DULERA) 200-5 MCG/ACT inhaler 2 puff  2 puff Inhalation BID Lindon Romp A, NP   2 puff at 09/25/19 0853  . multivitamin with minerals tablet 1 tablet  1 tablet Oral Daily Kierrah Kilbride, Myer Peer, MD   1 tablet at 09/25/19 [phone]  . thiamine (B-1) injection 100 mg  100 mg Intramuscular Once Vitali Seibert, Myer Peer, MD      . thiamine (VITAMIN B-1) tablet 100 mg  100 mg Oral Daily Stepheny Canal, Myer Peer, MD   100 mg at 09/25/19 0938    Lab Results:  Results for orders placed or performed during the hospital encounter of 09/24/19 (from the past 48 hour(s))  Hemoglobin A1c     Status: None   Collection Time: 09/25/19  6:28 AM  Result Value Ref Range   Hgb A1c MFr Bld 5.1 4.8 - 5.6 %    Comment: (NOTE) Pre diabetes:          5.7%-6.4% Diabetes:              >6.4% Glycemic control for   <7.0% adults with diabetes    Mean Plasma Glucose 99.67 mg/dL    Comment: Performed at [facility], [place] [address], [place], [place] [address]  Lipid panel     Status: Abnormal   Collection Time: 09/25/19  6:28 AM  Result Value Ref Range   Cholesterol 187 0 - 200 mg/dL   Triglycerides 69 <150 mg/dL   HDL 59 >40 mg/dL   Total CHOL/HDL Ratio 3.2 RATIO   VLDL 14 0 - 40 mg/dL   LDL Cholesterol 114 (H) 0 - 99 mg/dL    Comment:        Total Cholesterol/HDL:CHD  Risk Coronary Heart Disease Risk Table                     Men   Women  1/2 Average Risk   3.4   3.3  Average Risk       5.0   4.4  2 X Average Risk   9.6   7.1  3 X Average Risk  23.4   11.0        Use the calculated Patient Ratio above and the CHD Risk Table to determine the patient's CHD Risk.        ATP III CLASSIFICATION (LDL):  <100     mg/dL   Optimal  100-129  mg/dL   Near or Above                    Optimal  130-159  mg/dL   Borderline  160-189  mg/dL   High  >190     mg/dL   Very High Performed at [facility] [address], [place], [place] [address]   TSH     Status: None   Collection Time: 09/25/19  6:28 AM  Result Value Ref Range   TSH 2.103 0.350 - 4.500 uIU/mL    Comment: Performed by a 3rd Generation assay with a functional sensitivity of <=0.01 uIU/mL. Performed at [facility], [place] [address] [place], [place] [address]     Blood Alcohol level:  No results found for: [facility]  Metabolic Disorder Labs: Lab Results  Component Value Date   HGBA1C 5.1 09/25/2019   MPG 99.67 09/25/2019   No results found for: PROLACTIN Lab Results  Component Value Date   CHOL 187 09/25/2019   TRIG 69 09/25/2019   HDL 59 09/25/2019   CHOLHDL 3.2 09/25/2019   VLDL 14 09/25/2019   LDLCALC 114 (H) 09/25/2019    Physical Findings: AIMS: Facial and Oral Movements Muscles of Facial Expression: None, normal Lips and Perioral Area: None, normal Jaw: None, normal Tongue: None, normal,Extremity Movements Upper (arms, wrists, hands, fingers): None, normal Lower (legs, knees, ankles, toes): None, normal, Trunk Movements Neck, shoulders, hips: None, normal, Overall Severity Severity of abnormal movements (highest score from questions above): None, normal Incapacitation due to abnormal movements: None, normal Patient's awareness of abnormal movements (rate only patient's report): No Awareness, Dental Status Current problems with teeth  and/or dentures?: No Does patient usually wear dentures?: No  CIWA:  CIWA-Ar Total: 1 COWS:  COWS Total Score: 1  Musculoskeletal: Strength & Muscle Tone: within normal limits Gait & Station: normal Patient leans: N/A  Psychiatric Specialty Exam: Physical Exam  ROS today does not endorse chest pain, no shortness of breath at room air, no vomiting  Blood pressure (!) 146/77, pulse 68, temperature 97.9 F (36.6 C), resp. rate 16, height '6\' 4"'  (1.93 m), weight 108.9 kg, SpO2 98 %.Body mass index is 29.22 kg/m.  General Appearance: Fairly Groomed  Eye Contact:  Fair  Speech:  Normal Rate  Volume:  Normal  Mood:  remains depressed but acknowledges feeling better than on admission  Affect:  constricted , improves partially during assessment  Thought Process:  Linear and Descriptions of Associations: Intact  Orientation:  Other:  fully alert and attentive  Thought Content:  no hallucinations at this time, does not appear internally preoccupied, no delusions are expressed   Suicidal Thoughts:  denies suicidal ideations at this time  Homicidal Thoughts:  No denies homicidal or violent ideations  Memory:  Recent and remote grossly intact  Judgement:  Fair  Insight:  Fair  Psychomotor Activity:  Normal no current psychomotor agitation or restlessness  Concentration:  Concentration: Good and Attention Span: Good  Recall:  Good  Fund of Knowledge:  Good  Language:  Good  Akathisia:  Negative  Handed:  Right  AIMS (if indicated):     Assets:  Desire for Improvement Resilience  ADL's:  Intact  Cognition:  WNL  Sleep:  Number of Hours: 6.75   Assessment: 63 year old male, presented to ED on 11/22 for chest pain ( was medically cleared) and for worsening depression, suicidal ideations, vague auditory hallucinations. Reported significant recent losses ( death of a sister from [place] 2 months ago, death of other members of his extended family) and unstable housing situation as contributing  stressors. Had been drinking 40-80 ounces of beer on most days, UDS (+) for cocaine .  Today patient presents with partially improved mood.  Denies SI at this time and contracts for safety on unit.  No current psychotic symptoms noted or reported.  He is expressing interest in going to a rehab setting at discharge.  Tolerating Remeron/Abilify well thus far. Treatment Plan Summary: Daily contact with patient to assess and evaluate symptoms and progress in treatment, Medication management, Plan Inpatient treatment and Medications as below Encourage group and milieu participation Encourage efforts to work on sobriety/abstinence Treatment team working on disposition planning options-see above Continue Ativan as needed for alcohol withdrawal as needed Continue thiamine/folate/multivitamin supplementation Continue Remeron 7.5 mg nightly for depression/insomnia Continue Lopressor 12.5 mg twice daily for hypertension Continue Abilify 2 mg daily for antidepressant augmentation Jenne Campus, MD 09/25/2019, 5:59 PM

## 2019-09-25 NOTE — Progress Notes (Signed)
CSW spoke with admissions at [facility], unfortunately, the patient's Medicare would only cover detox, not residential substance use treatment.   CSW will review other treatment options with patient.  Stephanie Acre, MSW, [place] Social Worker [facility] Adult Unit  [phone]

## 2019-09-25 NOTE — Progress Notes (Addendum)
D:  Patient's self inventory sheet, patient sleeps good, sleep medication good.  Good appetite, low energy level, good concentration.  Rated depression 7, hopeless 6, anxiety 5.  Deneid withdrawals.  Denied SI.  Denied physical problems.  Denied physical pain.  Goal is stay positive.  Plans to work on it.  Does have discharge plans. A:  Medications administered per MD orders.  Emotional support and encouragement given patient. R:  Denied SI and HI, contracts for safety.  Denied A/V hallucinations.  Safety maintained with 15 minute checks.

## 2019-09-25 NOTE — Progress Notes (Signed)
Recreation Therapy Notes  Animal-Assisted Activity (AAA) Program Checklist/Progress Notes Patient Eligibility Criteria Checklist & Daily Group note for Rec Tx Intervention  Date: 11.24.20 Time: 61 Location: [address]  AAA/T Program Assumption of Risk Form signed by Teacher, music or Parent Legal Guardian  YES   Patient is free of allergies or sever asthma  YES   Patient reports no fear of animals  YES   Patient reports no history of cruelty to animals  YES   Patient understands his/her participation is voluntary YES   Patient washes hands before animal contact  YES   Patient washes hands after animal contact  YES   Behavioral Response: Engaged  Education: Contractor, Appropriate Animal Interaction   Education Outcome: Acknowledges understanding/In group clarification offered/Needs additional education.   Clinical Observations/Feedback:  Pt attended and participated in activity.   Victorino Sparrow, LRT/CTRS         Victorino Sparrow A 09/25/2019 3:30 PM

## 2019-09-25 NOTE — Progress Notes (Signed)
   09/25/19 0133  Psych Admission Type (Psych Patients Only)  Admission Status Voluntary  Psychosocial Assessment  Patient Complaints Anxiety  Eye Contact Brief  Facial Expression Sad  Affect Appropriate to circumstance  Speech Logical/coherent  Interaction Assertive  Motor Activity Other (Comment) (WDL)  Appearance/Hygiene Unremarkable  Behavior Characteristics Anxious  Mood Depressed;Anxious  Thought Process  Coherency WDL  Content WDL  Delusions WDL  Perception Depersonalization  Hallucination None reported or observed  Judgment Impaired  Confusion None  Danger to Self  Current suicidal ideation? Denies  Self-Injurious Behavior No self-injurious ideation or behavior indicators observed or expressed   Agreement Not to Harm Self Yes  Description of Agreement contracts for safety verbal  Danger to Others  Danger to Others None reported or observed  D: Patient in dayroom reports he had a good day.  A: Medications administered as prescribed. Support and encouragement provided as needed.  R: Patient remains safe on the unit. Will continue to monitor for safety and stability.

## 2019-09-26 MED ORDER — MIRTAZAPINE 15 MG PO TABS
15.0000 mg | ORAL_TABLET | Freq: Every day | ORAL | Status: DC
  Administered 2019-09-26 – 2019-09-28 (×3): 15 mg via ORAL
  Filled 2019-09-26 (×6): qty 1

## 2019-09-26 NOTE — Tx Team (Signed)
Interdisciplinary Treatment and Diagnostic Plan Update  09/26/2019 Time of Session: 9:00am Robert Chan MRN: [id number]  Principal Diagnosis: <principal problem not specified>  Secondary Diagnoses: Active Problems:   Severe recurrent major depression without psychotic features (HCC)   Cocaine use disorder, mild, abuse (HCC)   Current Medications:  Current Facility-Administered Medications  Medication Dose Route Frequency Provider Last Rate Last Dose  . acetaminophen (TYLENOL) tablet 650 mg  650 mg Oral Q6H PRN Rozetta Nunnery, NP      . alum & mag hydroxide-simeth (MAALOX/MYLANTA) 200-200-20 MG/5ML suspension 30 mL  30 mL Oral Q4H PRN Lindon Romp A, NP      . ARIPiprazole (ABILIFY) tablet 2 mg  2 mg Oral Daily Cobos, Myer Peer, MD   2 mg at 09/26/19 [id number]  . hydrOXYzine (ATARAX/VISTARIL) tablet 25 mg  25 mg Oral Q6H PRN Cobos, Myer Peer, MD      . LORazepam (ATIVAN) tablet 1 mg  1 mg Oral Q6H PRN Cobos, Myer Peer, MD      . magnesium hydroxide (MILK OF MAGNESIA) suspension 30 mL  30 mL Oral Daily PRN Lindon Romp A, NP      . metoprolol tartrate (LOPRESSOR) tablet 12.5 mg  12.5 mg Oral BID Cobos, Myer Peer, MD   12.5 mg at 09/26/19 0917  . mirtazapine (REMERON) tablet 7.5 mg  7.5 mg Oral QHS Cobos, Myer Peer, MD   7.5 mg at 09/25/19 2106  . mometasone-formoterol (DULERA) 200-5 MCG/ACT inhaler 2 puff  2 puff Inhalation BID Lindon Romp A, NP   2 puff at 09/26/19 0916  . multivitamin with minerals tablet 1 tablet  1 tablet Oral Daily Cobos, Myer Peer, MD   1 tablet at 09/26/19 0916  . thiamine (B-1) injection 100 mg  100 mg Intramuscular Once Cobos, Fernando A, MD      . thiamine (VITAMIN B-1) tablet 100 mg  100 mg Oral Daily Cobos, Myer Peer, MD   100 mg at 09/26/19 [id number]   PTA Medications: Medications Prior to Admission  Medication Sig Dispense Refill Last Dose  . albuterol (PROVENTIL HFA;VENTOLIN HFA) 108 (90 Base) MCG/ACT inhaler Inhale 1-2 puffs every 6 (six) hours as  needed into the lungs for wheezing or shortness of breath. 1 Inhaler 0   . albuterol (PROVENTIL HFA;VENTOLIN HFA) 108 (90 Base) MCG/ACT inhaler Inhale 1-2 puffs every 6 (six) hours as needed into the lungs for wheezing or shortness of breath. (Patient not taking: Reported on 09/23/2019) 1 Inhaler 1   . amoxicillin-clavulanate (AUGMENTIN) 875-125 MG tablet Take 1 tablet by mouth 2 (two) times daily.     . budesonide-formoterol (SYMBICORT) 160-4.5 MCG/ACT inhaler Inhale 2 puffs 2 (two) times daily into the lungs. 1 Inhaler 0   . budesonide-formoterol (SYMBICORT) 160-4.5 MCG/ACT inhaler Inhale 2 puffs 2 (two) times daily into the lungs. (Patient not taking: Reported on 09/23/2019) 1 Inhaler 0   . clotrimazole (LOTRIMIN) 1 % cream Apply to affected area 2 times daily (Patient not taking: Reported on 09/23/2019) 15 g 0   . erythromycin ophthalmic ointment See admin instructions. Place a 1/2 inch ribbon of ointment into the lower eyelid. Use every 6 hours for one week       Patient Stressors: Financial difficulties Marital or family conflict Occupational concerns  Patient Strengths: Average or above average intelligence Capable of independent living Motivation for treatment/growth Work skills  Treatment Modalities: Medication Management, Group therapy, Case management,  1 to 1 session with clinician, Psychoeducation, Recreational therapy.   Physician  Treatment Plan for Primary Diagnosis: <principal problem not specified> Long Term Goal(s): Improvement in symptoms so as ready for discharge Improvement in symptoms so as ready for discharge   Short Term Goals: Ability to identify changes in lifestyle to reduce recurrence of condition will improve Ability to verbalize feelings will improve Ability to disclose and discuss suicidal ideas Ability to demonstrate self-control will improve Ability to identify and develop effective coping behaviors will improve  Medication Management: Evaluate  patient's response, side effects, and tolerance of medication regimen.  Therapeutic Interventions: 1 to 1 sessions, Unit Group sessions and Medication administration.  Evaluation of Outcomes: Progressing  Physician Treatment Plan for Secondary Diagnosis: Active Problems:   Severe recurrent major depression without psychotic features (HCC)   Cocaine use disorder, mild, abuse ([place])  Long Term Goal(s): Improvement in symptoms so as ready for discharge Improvement in symptoms so as ready for discharge   Short Term Goals: Ability to identify changes in lifestyle to reduce recurrence of condition will improve Ability to verbalize feelings will improve Ability to disclose and discuss suicidal ideas Ability to demonstrate self-control will improve Ability to identify and develop effective coping behaviors will improve     Medication Management: Evaluate patient's response, side effects, and tolerance of medication regimen.  Therapeutic Interventions: 1 to 1 sessions, Unit Group sessions and Medication administration.  Evaluation of Outcomes: Progressing   RN Treatment Plan for Primary Diagnosis: <principal problem not specified> Long Term Goal(s): Knowledge of disease and therapeutic regimen to maintain health will improve  Short Term Goals: Ability to disclose and discuss suicidal ideas and Ability to identify and develop effective coping behaviors will improve  Medication Management: RN will administer medications as ordered by provider, will assess and evaluate patient's response and provide education to patient for prescribed medication. RN will report any adverse and/or side effects to prescribing provider.  Therapeutic Interventions: 1 on 1 counseling sessions, Psychoeducation, Medication administration, Evaluate responses to treatment, Monitor vital signs and CBGs as ordered, Perform/monitor CIWA, COWS, AIMS and Fall Risk screenings as ordered, Perform wound care treatments as  ordered.  Evaluation of Outcomes: Progressing   LCSW Treatment Plan for Primary Diagnosis: <principal problem not specified> Long Term Goal(s): Safe transition to appropriate next level of care at discharge, Engage patient in therapeutic group addressing interpersonal concerns.  Short Term Goals: Engage patient in aftercare planning with referrals and resources, Increase social support, Increase emotional regulation, Identify triggers associated with mental health/substance abuse issues and Increase skills for wellness and recovery  Therapeutic Interventions: Assess for all discharge needs, 1 to 1 time with Social worker, Explore available resources and support systems, Assess for adequacy in community support network, Educate family and significant other(s) on suicide prevention, Complete Psychosocial Assessment, Interpersonal group therapy.  Evaluation of Outcomes: Progressing  Progress in Treatment: Attending groups: Yes. Participating in groups: Yes. Taking medication as prescribed: Yes. Toleration medication: Yes. Family/Significant other contact made: No, will contact:  SPE reviewed with patient.  Patient understands diagnosis: Yes. Discussing patient identified problems/goals with staff: Yes. Medical problems stabilized or resolved: Yes. Denies suicidal/homicidal ideation: No. Issues/concerns per patient self-inventory: Yes.  New problem(s) identified: Yes, Describe:  family stressors, housing instability  New Short Term/Long Term Goal(s): detox, medication management for mood stabilization; elimination of SI thoughts; development of comprehensive mental wellness/sobriety plan.  Patient Goals: Get a good discharge plans with follow up, get set up with meds.  Discharge Plan or Barriers: Patient referred for residential substance use treatment, but only detox is  covered by his insurance provider. Patient provided a list of boarding houses and will be referred for outpatient  follow up.  Reason for Continuation of Hospitalization: Anxiety Depression Medication stabilization Suicidal ideation  Estimated Length of Stay: 3-5 days  Attendees: Patient: Robert Chan 09/26/2019 10:28 AM  Physician: Larene Beach 09/26/2019 10:28 AM  Nursing: Legrand Como, RN 09/26/2019 10:28 AM  RN Care Manager: 09/26/2019 10:28 AM  Social Worker: Stephanie Acre, [place] 09/26/2019 10:28 AM  Recreational Therapist:  09/26/2019 10:28 AM  Other: Harriett Sine, NP 09/26/2019 10:28 AM  Other:  09/26/2019 10:28 AM  Other: 09/26/2019 10:28 AM    Scribe for Treatment Team: Joellen Jersey, [place] 09/26/2019 10:28 AM

## 2019-09-26 NOTE — Progress Notes (Signed)
   09/26/19 2200  Psych Admission Type (Psych Patients Only)  Admission Status Voluntary  Psychosocial Assessment  Patient Complaints Depression  Eye Contact Brief  Facial Expression Sullen;Sad  Affect Depressed;Sad;Sullen  Speech Logical/coherent;Soft  Interaction Avoidant;Cautious;Minimal  Motor Activity Slow  Appearance/Hygiene Unremarkable  Behavior Characteristics Cooperative  Mood Depressed  Thought Process  Coherency WDL  Content WDL  Delusions None reported or observed  Perception Hallucinations  Hallucination Auditory  Judgment Poor  Confusion None  Danger to Self  Current suicidal ideation? Passive  Self-Injurious Behavior No self-injurious ideation or behavior indicators observed or expressed   Agreement Not to Harm Self Yes  Description of Agreement contracts for safety verbal  Danger to Others  Danger to Others None reported or observed

## 2019-09-26 NOTE — Progress Notes (Signed)
[facility] MD Progress Note  09/26/2019 5:21 PM Robert Chan  MRN:  [id number] Subjective: Reports some improvement.  States "I am okay".  Does endorse some residual/persistent depression.  Currently tolerating medications well. Objective : I have discussed case with treatment team and have met with patient. 63 year old male, presented to ED on 11/22 for chest pain ( was medically cleared) and for worsening depression, suicidal ideations, vague auditory hallucinations. Reported significant recent losses ( death of a sister from [place] 2 months ago, death of other members of his extended family) and unstable housing situation as contributing stressors. Had been drinking 40-80 ounces of beer on most days, UDS (+) for cocaine .  Today patient describes partial improvement compared to admission.  He does continue to present with a vaguely constricted/sad affect.  Denies SI at this time and presents future oriented, hoping to be able to go to rehab at discharge.  Chart notes indicate that earlier this morning he did endorse some passive SI.  At this time contracts for safety on unit.  Denies medication side effects. No disruptive or agitated behaviors on unit-limited milieu participation.   Principal Problem: Depression Diagnosis: Active Problems:   Severe recurrent major depression without psychotic features (HCC)   Cocaine use disorder, mild, abuse ([place])  Total Time spent with patient: 15 minutes  Past Psychiatric History:   Past Medical History:  Past Medical History:  Diagnosis Date  . Asthma   . Cataract   . COPD (chronic obstructive pulmonary disease) ([place])   . Hypertension     Past Surgical History:  Procedure Laterality Date  . APPENDECTOMY    . EYE SURGERY    . PROSTATE SURGERY     Family History: History reviewed. No pertinent family history. Family Psychiatric  History:  Social History:  Social History   Substance and Sexual Activity  Alcohol Use Yes     Social History    Substance and Sexual Activity  Drug Use No    Social History   Socioeconomic History  . Marital status: Single    Spouse name: Not on file  . Number of children: Not on file  . Years of education: Not on file  . Highest education level: Not on file  Occupational History  . Not on file  Social Needs  . Financial resource strain: Not on file  . Food insecurity    Worry: Not on file    Inability: Not on file  . Transportation needs    Medical: Not on file    Non-medical: Not on file  Tobacco Use  . Smoking status: Current Some Day Smoker  . Smokeless tobacco: Never Used  Substance and Sexual Activity  . Alcohol use: Yes  . Drug use: No  . Sexual activity: Not on file  Lifestyle  . Physical activity    Days per week: Not on file    Minutes per session: Not on file  . Stress: Not on file  Relationships  . Social Herbalist on phone: Not on file    Gets together: Not on file    Attends religious service: Not on file    Active member of club or organization: Not on file    Attends meetings of clubs or organizations: Not on file    Relationship status: Not on file  Other Topics Concern  . Not on file  Social History Narrative  . Not on file   Additional Social History:   Sleep: Good  Appetite:  Fair/improving  Current Medications: Current Facility-Administered Medications  Medication Dose Route Frequency Provider Last Rate Last Dose  . acetaminophen (TYLENOL) tablet 650 mg  650 mg Oral Q6H PRN Lindon Romp A, NP   650 mg at 09/26/19 1718  . alum & mag hydroxide-simeth (MAALOX/MYLANTA) 200-200-20 MG/5ML suspension 30 mL  30 mL Oral Q4H PRN Lindon Romp A, NP      . ARIPiprazole (ABILIFY) tablet 2 mg  2 mg Oral Daily , Myer Peer, MD   2 mg at 09/26/19 8638  . hydrOXYzine (ATARAX/VISTARIL) tablet 25 mg  25 mg Oral Q6H PRN , Myer Peer, MD      . LORazepam (ATIVAN) tablet 1 mg  1 mg Oral Q6H PRN , Myer Peer, MD      . magnesium hydroxide  (MILK OF MAGNESIA) suspension 30 mL  30 mL Oral Daily PRN Lindon Romp A, NP      . metoprolol tartrate (LOPRESSOR) tablet 12.5 mg  12.5 mg Oral BID , Myer Peer, MD   12.5 mg at 09/26/19 1717  . mirtazapine (REMERON) tablet 7.5 mg  7.5 mg Oral QHS , Myer Peer, MD   7.5 mg at 09/25/19 2106  . mometasone-formoterol (DULERA) 200-5 MCG/ACT inhaler 2 puff  2 puff Inhalation BID Lindon Romp A, NP   2 puff at 09/26/19 0916  . multivitamin with minerals tablet 1 tablet  1 tablet Oral Daily , Myer Peer, MD   1 tablet at 09/26/19 0916  . thiamine (B-1) injection 100 mg  100 mg Intramuscular Once , Myer Peer, MD      . thiamine (VITAMIN B-1) tablet 100 mg  100 mg Oral Daily , Myer Peer, MD   100 mg at 09/26/19 1771    Lab Results:  Results for orders placed or performed during the hospital encounter of 09/24/19 (from the past 48 hour(s))  Hemoglobin A1c     Status: None   Collection Time: 09/25/19  6:28 AM  Result Value Ref Range   Hgb A1c MFr Bld 5.1 4.8 - 5.6 %    Comment: (NOTE) Pre diabetes:          5.7%-6.4% Diabetes:              >6.4% Glycemic control for   <7.0% adults with diabetes    Mean Plasma Glucose 99.67 mg/dL    Comment: Performed at [facility], [place] [address], [place], [place] [address]  Lipid panel     Status: Abnormal   Collection Time: 09/25/19  6:28 AM  Result Value Ref Range   Cholesterol 187 0 - 200 mg/dL   Triglycerides 69 <150 mg/dL   HDL 59 >40 mg/dL   Total CHOL/HDL Ratio 3.2 RATIO   VLDL 14 0 - 40 mg/dL   LDL Cholesterol 114 (H) 0 - 99 mg/dL    Comment:        Total Cholesterol/HDL:CHD Risk Coronary Heart Disease Risk Table                     Men   Women  1/2 Average Risk   3.4   3.3  Average Risk       5.0   4.4  2 X Average Risk   9.6   7.1  3 X Average Risk  23.4   11.0        Use the calculated Patient Ratio above and the CHD Risk Table to determine the patient's CHD Risk.  ATP III CLASSIFICATION (LDL):   <100     mg/dL   Optimal  100-129  mg/dL   Near or Above                    Optimal  130-159  mg/dL   Borderline  160-189  mg/dL   High  >190     mg/dL   Very High Performed at [facility] [address] [place], [place] [address]   TSH     Status: None   Collection Time: 09/25/19  6:28 AM  Result Value Ref Range   TSH 2.103 0.350 - 4.500 uIU/mL    Comment: Performed by a 3rd Generation assay with a functional sensitivity of <=0.01 uIU/mL. Performed at [facility], [place] [address] [place], [place] [address]     Blood Alcohol level:  No results found for: [facility]  Metabolic Disorder Labs: Lab Results  Component Value Date   HGBA1C 5.1 09/25/2019   MPG 99.67 09/25/2019   No results found for: PROLACTIN Lab Results  Component Value Date   CHOL 187 09/25/2019   TRIG 69 09/25/2019   HDL 59 09/25/2019   CHOLHDL 3.2 09/25/2019   VLDL 14 09/25/2019   LDLCALC 114 (H) 09/25/2019    Physical Findings: AIMS: Facial and Oral Movements Muscles of Facial Expression: None, normal Lips and Perioral Area: None, normal Jaw: None, normal Tongue: None, normal,Extremity Movements Upper (arms, wrists, hands, fingers): None, normal Lower (legs, knees, ankles, toes): None, normal, Trunk Movements Neck, shoulders, hips: None, normal, Overall Severity Severity of abnormal movements (highest score from questions above): None, normal Incapacitation due to abnormal movements: None, normal Patient's awareness of abnormal movements (rate only patient's report): No Awareness, Dental Status Current problems with teeth and/or dentures?: No Does patient usually wear dentures?: No  CIWA:  CIWA-Ar Total: 0 COWS:  COWS Total Score: 1  Musculoskeletal: Strength & Muscle Tone: within normal limits Gait & Station: normal Patient leans: N/A  Psychiatric Specialty Exam: Physical Exam  ROS no chest pain or shortness of breath at room air, no vomiting  Blood  pressure 116/73, pulse 72, temperature 98 F (36.7 C), temperature source Oral, resp. rate 16, height _0  (1.93 m), weight 108.9 kg, SpO2 98 %.Body mass index is 29.22 kg/m.  General Appearance: Improving grooming  Eye Contact:  Fair  Speech:  Normal Rate  Volume:  Normal  Mood:  Describes some improvement, remains vaguely depressed and constricted in affect  Affect:  Constricted  Thought Process:  Linear and Descriptions of Associations: Intact  Orientation:  Other:  fully alert and attentive  Thought Content: Currently does not endorse hallucinations and does not appear internally preoccupied, no delusions are expressed  Suicidal Thoughts:  denies suicidal ideations at this time, contracts for safety on unit.  Chart notes indicate he endorsed some passive SI earlier today.  Homicidal Thoughts:  No denies homicidal or violent ideations  Memory:  Recent and remote grossly intact  Judgement:  Fair/improving  Insight:  Fair/improving  Psychomotor Activity:  Normal no current psychomotor agitation or restlessness  Concentration:  Concentration: Good and Attention Span: Good  Recall:  Good  Fund of Knowledge:  Good  Language:  Good  Akathisia:  Negative  Handed:  Right  AIMS (if indicated):     Assets:  Desire for Improvement Resilience  ADL's:  Intact  Cognition:  WNL  Sleep:  Number of Hours: 6.75   Assessment: 63 year old male,  presented to ED on 11/22 for chest pain ( was medically cleared) and for worsening depression, suicidal ideations, vague auditory hallucinations. Reported significant recent losses ( death of a sister from [place] 2 months ago, death of other members of his extended family) and unstable housing situation as contributing stressors. Had been drinking 40-80 ounces of beer on most days, UDS (+) for cocaine .  Patient presents with residual depression and a relatively constricted affect.  At this time denies suicidal ideations and presents future oriented but chart  notes indicate he did endorse some passive suicidal ideations earlier today.  Currently contracts for safety on unit.  He is not presenting with symptoms of withdrawal and his vitals are stable.  He denies medication side effects.. Has been working with [facility] regarding disposition planning options, currently looking into outpatient residential options/boarding houses.  Treatment Plan Summary: Daily contact with patient to assess and evaluate symptoms and progress in treatment, Medication management, Plan Inpatient treatment and Medications as below  Treatment plan reviewed as below today 11/25 Encourage group and milieu participation Encourage efforts to work on sobriety/abstinence Treatment team working on disposition planning options Continue Ativan as needed for alcohol withdrawal as needed Continue thiamine/folate/multivitamin supplementation Increase Remeron to 15 mg nightly for depression/insomnia Continue Lopressor 12.5 mg twice daily for hypertension Continue Abilify 2 mg daily for antidepressant augmentation Jenne Campus, MD 09/26/2019, 5:21 PM   Patient ID: Robert Chan, male   DOB: 03/05/1956, 63 y.o.   MRN: [id number]

## 2019-09-26 NOTE — Progress Notes (Signed)
The patient's positive event for the day is that he worked on his discharge plans. No additional details were offered regarding his day.

## 2019-09-26 NOTE — Plan of Care (Signed)
Patient was in room at the beginning of this shift. He woke up later and was seen in the milieu.  Attended group and discussed his discharge plan. Presented to the medication window and expressed passive SI and contracted for safety. Patient reported that main stressor is the recent loss of family members "things haven't been easy for me". Patient received medications as prescribed. Emotional support provided. Currently sleeping and safety maintained.

## 2019-09-26 NOTE — Progress Notes (Signed)
Adult Psychoeducational Group Note  Date:  09/26/2019 Time:  11:44 AM  Group Topic/Focus:  Identifying Needs:   The focus of this group is to help patients identify their personal needs that have been historically problematic and identify healthy behaviors to address their needs.  Participation Level:  Did Not Attend  Pt was informed that group with MHT will be starting. Pt chose not to attend group and continue to laying in bed.    Lita Mains 09/26/2019, 11:44 AM

## 2019-09-26 NOTE — Progress Notes (Deleted)
   09/26/19 2200  Psych Admission Type (Psych Patients Only)  Admission Status Voluntary  Psychosocial Assessment  Patient Complaints Depression  Eye Contact Brief  Facial Expression Sullen;Sad  Affect Depressed;Sad;Sullen  Speech Logical/coherent;Soft  Interaction Avoidant;Cautious;Minimal  Motor Activity Slow  Appearance/Hygiene Unremarkable  Behavior Characteristics Cooperative  Mood Depressed  Thought Process  Coherency WDL  Content WDL  Delusions None reported or observed  Perception WDL  Hallucination None reported or observed  Judgment Poor  Confusion None  Danger to Self  Current suicidal ideation? Denies  Self-Injurious Behavior No self-injurious ideation or behavior indicators observed or expressed   Agreement Not to Harm Self Yes  Description of Agreement contracts for safety verbal  Danger to Others  Danger to Others None reported or observed

## 2019-09-26 NOTE — Plan of Care (Signed)
Progress note  D: pt found in bed; compliant with medication administration. Pt denies any physical complaints. Pt does have lower back pain which they feel comes from the bedding. Pt is sullen and sad in their affect but assures this Probation officer they are "ok". Pt denies si/hi/ah/vh and verbally agrees to approach staff if these become apparent or before harming themself/others while at [place].  A: Pt provided support and encouragement. Pt given medication per protocol and standing orders. Q29m safety checks implemented and continued.  R: Pt safe on the unit. Will continue to monitor.  Pt progressing in the following metrics  Problem: Education: Goal: Knowledge of [place] [facility] Education information/materials will improve Outcome: Progressing Goal: Emotional status will improve Outcome: Progressing Goal: Mental status will improve Outcome: Progressing Goal: Verbalization of understanding the information provided will improve Outcome: Progressing

## 2019-09-27 MED ORDER — LIDOCAINE 5 % EX PTCH
1.0000 | MEDICATED_PATCH | Freq: Every day | CUTANEOUS | Status: DC
  Administered 2019-09-27 – 2019-10-01 (×5): 1 via TRANSDERMAL
  Filled 2019-09-27 (×7): qty 1

## 2019-09-27 NOTE — Progress Notes (Signed)
   09/27/19 2351  Psych Admission Type (Psych Patients Only)  Admission Status Voluntary  Psychosocial Assessment  Patient Complaints Depression  Eye Contact Brief  Facial Expression Sullen;Sad  Affect Depressed;Sad;Sullen  Speech Logical/coherent;Soft  Interaction Cautious;Minimal  Motor Activity Slow  Appearance/Hygiene Unremarkable  Behavior Characteristics Cooperative  Mood Depressed  Thought Process  Coherency WDL  Content WDL  Delusions None reported or observed  Perception WDL  Hallucination None reported or observed  Judgment Poor  Confusion None  Danger to Self  Current suicidal ideation? Denies  Self-Injurious Behavior No self-injurious ideation or behavior indicators observed or expressed   Agreement Not to Harm Self Yes  Description of Agreement contracts for safety verbal  Danger to Others  Danger to Others None reported or observed  D: Patient in dayroom reports he is doing good. Pt has been in his room most of the evening. Pt mood and affect appeared depressed and flat.  A: Medications administered as prescribed. Support and encouragement provided as needed.  R: Patient remains safe on the unit. Will continue to monitor for safety and stability.

## 2019-09-27 NOTE — [facility] Group Notes (Signed)
[place] Group Notes:  (Nursing/MHT/Case Management/Adjunct)  Date:  09/27/2019  Time:  9:10 PM  Type of Therapy:  Wrap Up Group  Participation Level:  Did Not Attend    Scherrie November 09/27/2019, 9:10 PM

## 2019-09-27 NOTE — Progress Notes (Signed)
D. Pt presents as anxious and depressed-brightens a little upon approach. Per pt's self inventory, pt rated his depression, hopelessness and anxiety a 5/5/8, respectively. Pt writes that his goal today is "work on depression and peace of mind", and "stay positive" Pt currently denies SI/HI and AVH and agrees to contact staff before acting on any harmful thoughts.  A. Labs and vitals monitored. Pt compliant with medications. Pt supported emotionally and encouraged to express concerns and ask questions.   R. Pt remains safe with 15 minute checks. Will continue POC.

## 2019-09-27 NOTE — Progress Notes (Signed)
[place] NOVEL CORONAVIRUS (COVID-19) DAILY CHECK-OFF SYMPTOMS - answer yes or no to each - every day NO YES  Have you had a fever in the past 24 hours?  . Fever (Temp > 37.80C / 100F) X   Have you had any of these symptoms in the past 24 hours? . New Cough .  Sore Throat  .  Shortness of Breath .  Difficulty Breathing .  Unexplained Body Aches   X   Have you had any one of these symptoms in the past 24 hours not related to allergies?   . Runny Nose .  Nasal Congestion .  Sneezing   X   If you have had runny nose, nasal congestion, sneezing in the past 24 hours, has it worsened?  X   EXPOSURES - check yes or no X   Have you traveled outside the state in the past 14 days?  X   Have you been in contact with someone with a confirmed diagnosis of COVID-19 or PUI in the past 14 days without wearing appropriate PPE?  X   Have you been living in the same home as a person with confirmed diagnosis of COVID-19 or a PUI (household contact)?    X   Have you been diagnosed with COVID-19?    X              What to do next: Answered NO to all: Answered YES to anything:   Proceed with unit schedule Follow the [facility] Inpatient Flowsheet.   

## 2019-09-27 NOTE — Progress Notes (Addendum)
[facility] MD Progress Note  09/27/2019 8:46 AM Robert Chan  MRN:  [name]   Subjective:  Mizael reported " I feeling okay. Just waiting for a 28 day program.  Objective: Alando observed resting in bed.  He presents flat, guarded but pleasant.  Currently denying suicidal or homicidal ideations.  Denies auditory or visual hallucinations.  Patient reports " I know I need to get help long-term."  Reported social worker is seeking residential treatment facilities.  Denied that he has attended residential treatment facilities before.  Rates his depression 4 out of 10 with 10 being the worst.  Denies anxiety.  Reports a good appetite. States he is resting well throughout the night.  Patient was encouraged to attend daily group sessions.  Patient was receptive to plan.  Reported taking medications as directed.  Reported history with drugs and alcohol denying cravings or withdrawal symptoms.  Denies any medication side effects.  We will continue to monitor for symptoms. Arling reported lower back pain, will initiated Lidoderm patch.  Support, encouragement and  reassurance was provided.  Principal Problem: Depression Diagnosis: Active Problems:   Severe recurrent major depression without psychotic features (HCC)   Cocaine use disorder, mild, abuse ([place])  Total Time spent with patient: 15 minutes  Past Psychiatric History:   Past Medical History:  Past Medical History:  Diagnosis Date  . Asthma   . Cataract   . COPD (chronic obstructive pulmonary disease) ([place])   . Hypertension     Past Surgical History:  Procedure Laterality Date  . APPENDECTOMY    . EYE SURGERY    . PROSTATE SURGERY     Family History: History reviewed. No pertinent family history. Family Psychiatric  History:  Social History:  Social History   Substance and Sexual Activity  Alcohol Use Yes     Social History   Substance and Sexual Activity  Drug Use No    Social History   Socioeconomic History  . Marital  status: Single    Spouse name: Not on file  . Number of children: Not on file  . Years of education: Not on file  . Highest education level: Not on file  Occupational History  . Not on file  Social Needs  . Financial resource strain: Not on file  . Food insecurity    Worry: Not on file    Inability: Not on file  . Transportation needs    Medical: Not on file    Non-medical: Not on file  Tobacco Use  . Smoking status: Current Some Day Smoker  . Smokeless tobacco: Never Used  Substance and Sexual Activity  . Alcohol use: Yes  . Drug use: No  . Sexual activity: Not on file  Lifestyle  . Physical activity    Days per week: Not on file    Minutes per session: Not on file  . Stress: Not on file  Relationships  . Social Herbalist on phone: Not on file    Gets together: Not on file    Attends religious service: Not on file    Active member of club or organization: Not on file    Attends meetings of clubs or organizations: Not on file    Relationship status: Not on file  Other Topics Concern  . Not on file  Social History Narrative  . Not on file   Additional Social History:   Sleep: Good  Appetite:  Fair/improving  Current Medications: Current Facility-Administered Medications  Medication Dose  Route Frequency Provider Last Rate Last Dose  . acetaminophen (TYLENOL) tablet 650 mg  650 mg Oral Q6H PRN Lindon Romp A, NP   650 mg at 09/26/19 1718  . alum & mag hydroxide-simeth (MAALOX/MYLANTA) 200-200-20 MG/5ML suspension 30 mL  30 mL Oral Q4H PRN Lindon Romp A, NP      . ARIPiprazole (ABILIFY) tablet 2 mg  2 mg Oral Daily Cobos, Myer Peer, MD   2 mg at 09/26/19 0917  . hydrOXYzine (ATARAX/VISTARIL) tablet 25 mg  25 mg Oral Q6H PRN Cobos, Myer Peer, MD   25 mg at 09/26/19 2052  . LORazepam (ATIVAN) tablet 1 mg  1 mg Oral Q6H PRN Cobos, Myer Peer, MD      . magnesium hydroxide (MILK OF MAGNESIA) suspension 30 mL  30 mL Oral Daily PRN Lindon Romp A, NP      .  metoprolol tartrate (LOPRESSOR) tablet 12.5 mg  12.5 mg Oral BID Cobos, Myer Peer, MD   12.5 mg at 09/26/19 1717  . mirtazapine (REMERON) tablet 15 mg  15 mg Oral QHS Cobos, Myer Peer, MD   15 mg at 09/26/19 2052  . mometasone-formoterol (DULERA) 200-5 MCG/ACT inhaler 2 puff  2 puff Inhalation BID Lindon Romp A, NP   2 puff at 09/26/19 2052  . multivitamin with minerals tablet 1 tablet  1 tablet Oral Daily Cobos, Myer Peer, MD   1 tablet at 09/26/19 0916  . thiamine (B-1) injection 100 mg  100 mg Intramuscular Once Cobos, Fernando A, MD      . thiamine (VITAMIN B-1) tablet 100 mg  100 mg Oral Daily Cobos, Myer Peer, MD   100 mg at 09/26/19 [id number]    Lab Results:  No results found for this or any previous visit (from the past 47 hour(s)).  Blood Alcohol level:  No results found for: [facility]  Metabolic Disorder Labs: Lab Results  Component Value Date   HGBA1C 5.1 09/25/2019   MPG 99.67 09/25/2019   No results found for: PROLACTIN Lab Results  Component Value Date   CHOL 187 09/25/2019   TRIG 69 09/25/2019   HDL 59 09/25/2019   CHOLHDL 3.2 09/25/2019   VLDL 14 09/25/2019   LDLCALC 114 (H) 09/25/2019    Physical Findings: AIMS: Facial and Oral Movements Muscles of Facial Expression: None, normal Lips and Perioral Area: None, normal Jaw: None, normal Tongue: None, normal,Extremity Movements Upper (arms, wrists, hands, fingers): None, normal Lower (legs, knees, ankles, toes): None, normal, Trunk Movements Neck, shoulders, hips: None, normal, Overall Severity Severity of abnormal movements (highest score from questions above): None, normal Incapacitation due to abnormal movements: None, normal Patient's awareness of abnormal movements (rate only patient's report): No Awareness, Dental Status Current problems with teeth and/or dentures?: No Does patient usually wear dentures?: No  CIWA:  CIWA-Ar Total: 2 COWS:  COWS Total Score: 1  Musculoskeletal: Strength & Muscle Tone:  within normal limits Gait & Station: normal Patient leans: N/A  Psychiatric Specialty Exam: Physical Exam  Vitals reviewed. Constitutional: He appears well-developed.  Psychiatric: He has a normal mood and affect.    Review of Systems  Psychiatric/Behavioral: Positive for depression. Negative for hallucinations and suicidal ideas. The patient is nervous/anxious.   All other systems reviewed and are negative.    Blood pressure 114/84, pulse 62, temperature 97.7 F (36.5 C), temperature source Oral, resp. rate 16, height 6\' 4"  (1.93 m), weight 108.9 kg, SpO2 98 %.Body mass index is 29.22 kg/m.  General Appearance: Casual  Eye Contact:  Fair  Speech:  Normal Rate  Volume:  Normal  Mood:  Anxious and Depressed  Affect:  Depressed and Flat  Thought Process:  Linear  Orientation:  Full (Time, Place, and Person)  Thought Content:  Suicidal Thoughts:  denies suicidal ideations at this time  Homicidal Thoughts:  No   Memory:  Recent and remote grossly intact  Judgement:  Fair  Insight:  Fair  Psychomotor Activity:  Normal   Concentration:  Concentration: Good and Attention Span: Good  Recall:  Good  Fund of Knowledge:  Good  Language:  Good  Akathisia:  Negative  Handed:  Right  AIMS (if indicated):     Assets:  Desire for Improvement Resilience  ADL's:  Intact  Cognition:  WNL  Sleep:  Number of Hours: 6.75    Treatment Plan Summary: Daily contact with patient to assess and evaluate symptoms and progress in treatment and Medication management   Continue with current treatment plan on 09/27/2019 as listed below except were noted  Substance-induced mood disorder  Continue Ativan as needed for alcohol withdrawal as needed/ CWIA protocol Continue thiamine/folate/multivitamin supplementation Continue Remeron to 15 mg nightly for depression/insomnia Continue Lopressor 12.5 mg twice daily for hypertension Continue Abilify 2 mg daily for antidepressant augmentation Initiated  Lidoderm 5% patch for low back pain  Encourage group and milieu participation Encourage efforts to work on sobriety/abstinence Treatment team working on disposition planning options  Derrill Center, NP 09/27/2019, 8:46 AM   Agree with NP Progress Note

## 2019-09-28 NOTE — Progress Notes (Signed)
Recreation Therapy Notes  Date:  11.27.20 Time: 0930 Location: [address] Dayroom  Group Topic: Stress Management  Goal Area(s) Addresses:  Patient will identify positive stress management techniques. Patient will identify benefits of using stress management post d/c.  Behavioral Response:  Engaged  Intervention: Stress Management  Activity :  Progressive Muscle Relaxation.  LRT introduced the stress management technique of progressive muscle relaxation.  LRT read a script that guided patients in tensing then relaxing each muscle group.  Patients were to follow along as script was read to engage in activity.  Education:  Stress Management, Discharge Planning.   Education Outcome: Acknowledges Education  Clinical Observations/Feedback: Pt attended and participated in activity.     Victorino Sparrow, LRT/CTRS         Victorino Sparrow A 09/28/2019 11:25 AM

## 2019-09-28 NOTE — Progress Notes (Signed)
   09/28/19 2200  Psych Admission Type (Psych Patients Only)  Admission Status Voluntary  Psychosocial Assessment  Patient Complaints Anxiety  Eye Contact Fair  Facial Expression Anxious  Affect Anxious;Depressed  Speech Logical/coherent  Interaction Assertive  Motor Activity Slow  Appearance/Hygiene Unremarkable  Behavior Characteristics Cooperative  Mood Depressed  Thought Process  Coherency WDL  Content WDL  Delusions None reported or observed  Perception WDL  Hallucination None reported or observed  Judgment Poor  Confusion None  Danger to Self  Current suicidal ideation? Denies  Self-Injurious Behavior No self-injurious ideation or behavior indicators observed or expressed   Agreement Not to Harm Self Yes  Description of Agreement contracts for safety verbal  Danger to Others  Danger to Others None reported or observed   Pt stated he was ready for D/C tomorrow, but was still unsure of where he was going.

## 2019-09-28 NOTE — Progress Notes (Signed)
[facility] MD Progress Note  09/28/2019 3:45 PM Robert Chan  MRN:  [id number]   Subjective:  Patient reports partial improvement and acknowledges some improvement compared to admission. At this time denies SI and contracts for safety on unit . Presents future oriented and focused on disposition options, expressing interest in rehab settings. Denies medication side effects.   Objective:  I have reviewed chart notes and have met with patient. Patient presents alert, attentive, calm. Mood remains vaguely depressed, constricted, but states he is feeling better and affect does tend to improve partially during session. Denies SI and presents focused on disposition planning options, wanting to go to a rehab after discharge. Today does not endorse hallucinations and does not appear internally preoccupied. He has been visible in day room/unit, going to some groups, behavior calm and in good control, polite on approach.  Tolerating medications well, currently on Remeron, Abilify. He is not currently presenting with symptoms of withdrawal- no tremors, no diaphoresis or restlessness, BP 121/92, pulse 93.  Principal Problem: Depression Diagnosis: Active Problems:   Severe recurrent major depression without psychotic features (HCC)   Cocaine use disorder, mild, abuse ([place])  Total Time spent with patient: 15 minutes  Past Psychiatric History:   Past Medical History:  Past Medical History:  Diagnosis Date  . Asthma   . Cataract   . COPD (chronic obstructive pulmonary disease) ([place])   . Hypertension     Past Surgical History:  Procedure Laterality Date  . APPENDECTOMY    . EYE SURGERY    . PROSTATE SURGERY     Family History: History reviewed. No pertinent family history. Family Psychiatric  History:  Social History:  Social History   Substance and Sexual Activity  Alcohol Use Yes     Social History   Substance and Sexual Activity  Drug Use No    Social History   Socioeconomic History   . Marital status: Single    Spouse name: Not on file  . Number of children: Not on file  . Years of education: Not on file  . Highest education level: Not on file  Occupational History  . Not on file  Social Needs  . Financial resource strain: Not on file  . Food insecurity    Worry: Not on file    Inability: Not on file  . Transportation needs    Medical: Not on file    Non-medical: Not on file  Tobacco Use  . Smoking status: Current Some Day Smoker  . Smokeless tobacco: Never Used  Substance and Sexual Activity  . Alcohol use: Yes  . Drug use: No  . Sexual activity: Not on file  Lifestyle  . Physical activity    Days per week: Not on file    Minutes per session: Not on file  . Stress: Not on file  Relationships  . Social Herbalist on phone: Not on file    Gets together: Not on file    Attends religious service: Not on file    Active member of club or organization: Not on file    Attends meetings of clubs or organizations: Not on file    Relationship status: Not on file  Other Topics Concern  . Not on file  Social History Narrative  . Not on file   Additional Social History:   Sleep: Good  Appetite:  Fair/improving  Current Medications: Current Facility-Administered Medications  Medication Dose Route Frequency Provider Last Rate Last Dose  . acetaminophen (  TYLENOL) tablet 650 mg  650 mg Oral Q6H PRN Lindon Romp A, NP   650 mg at 09/27/19 1619  . alum & mag hydroxide-simeth (MAALOX/MYLANTA) 200-200-20 MG/5ML suspension 30 mL  30 mL Oral Q4H PRN Lindon Romp A, NP      . ARIPiprazole (ABILIFY) tablet 2 mg  2 mg Oral Daily Trayvion Embleton, Myer Peer, MD   2 mg at 09/28/19 0751  . lidocaine (LIDODERM) 5 % 1 patch  1 patch Transdermal Daily Derrill Center, NP   1 patch at 09/28/19 0750  . magnesium hydroxide (MILK OF MAGNESIA) suspension 30 mL  30 mL Oral Daily PRN Lindon Romp A, NP      . metoprolol tartrate (LOPRESSOR) tablet 12.5 mg  12.5 mg Oral BID Danilyn Cocke,  Myer Peer, MD   12.5 mg at 09/28/19 0750  . mirtazapine (REMERON) tablet 15 mg  15 mg Oral QHS Jeselle Hiser, Myer Peer, MD   15 mg at 09/27/19 2139  . mometasone-formoterol (DULERA) 200-5 MCG/ACT inhaler 2 puff  2 puff Inhalation BID Lindon Romp A, NP   2 puff at 09/28/19 0750  . multivitamin with minerals tablet 1 tablet  1 tablet Oral Daily Harlem Bula, Myer Peer, MD   1 tablet at 09/28/19 0750  . thiamine (B-1) injection 100 mg  100 mg Intramuscular Once Treyvon Blahut A, MD      . thiamine (VITAMIN B-1) tablet 100 mg  100 mg Oral Daily Amelianna Meller, Myer Peer, MD   100 mg at 09/28/19 0750    Lab Results:  No results found for this or any previous visit (from the past 66 hour(s)).  Blood Alcohol level:  No results found for: [facility]  Metabolic Disorder Labs: Lab Results  Component Value Date   HGBA1C 5.1 09/25/2019   MPG 99.67 09/25/2019   No results found for: PROLACTIN Lab Results  Component Value Date   CHOL 187 09/25/2019   TRIG 69 09/25/2019   HDL 59 09/25/2019   CHOLHDL 3.2 09/25/2019   VLDL 14 09/25/2019   LDLCALC 114 (H) 09/25/2019    Physical Findings: AIMS: Facial and Oral Movements Muscles of Facial Expression: None, normal Lips and Perioral Area: None, normal Jaw: None, normal Tongue: None, normal,Extremity Movements Upper (arms, wrists, hands, fingers): None, normal Lower (legs, knees, ankles, toes): None, normal, Trunk Movements Neck, shoulders, hips: None, normal, Overall Severity Severity of abnormal movements (highest score from questions above): None, normal Incapacitation due to abnormal movements: None, normal Patient's awareness of abnormal movements (rate only patient's report): No Awareness, Dental Status Current problems with teeth and/or dentures?: No Does patient usually wear dentures?: No  CIWA:  CIWA-Ar Total: 0 COWS:  COWS Total Score: 1  Musculoskeletal: Strength & Muscle Tone: within normal limits no tremors, no diaphoresis, no restlessness or  agitation Gait & Station: normal Patient leans: N/A  Psychiatric Specialty Exam: Physical Exam  Vitals reviewed. Constitutional: He appears well-developed.  Psychiatric: He has a normal mood and affect.    Review of Systems  Psychiatric/Behavioral: Positive for depression. Negative for hallucinations and suicidal ideas. The patient is nervous/anxious.   All other systems reviewed and are negative. no significant chest pain or shortness of breath at this time, no vomiting   Blood pressure (!) 121/92, pulse 93, temperature 97.6 F (36.4 C), temperature source Oral, resp. rate 18, height 6' 4" (1.93 m), weight 108.9 kg, SpO2 98 %.Body mass index is 29.22 kg/m.  General Appearance: Casual  Eye Contact:  improving  Speech:  Normal Rate  Volume:  Normal  Mood:  remains vaguely depressed, but acknowledges some improvement compared to admission  Affect:  remains vaguely constricted   Thought Process:  Linear and Descriptions of Associations: Intact  Orientation:  Other:  fully alert and attentive  Thought Content:  Suicidal Thoughts:  denies suicidal ideations at this time, contracts for safety on unit, no homicidal or violent ideations  Homicidal Thoughts:  No   Memory:  Recent and remote grossly intact  Judgement:  Fair/ improving   Insight:  Fair  Psychomotor Activity:  Normal   Concentration:  Concentration: Good and Attention Span: Good  Recall:  Good  Fund of Knowledge:  Good  Language:  Good  Akathisia:  Negative  Handed:  Right  AIMS (if indicated):     Assets:  Desire for Improvement Resilience  ADL's:  Intact  Cognition:  WNL  Sleep:  Number of Hours: 6.75   Assessment -  83 y old man presented on 11/22 for chest pain with work up negative for cardiac event. Also endorsed depression, SI with thoughts of shooting self ,neurovegetative symptoms/anhedonia, vague auditory hallucinations. Reported recent death of loved ones due to Covid as a contributing factor for  worsening depression. Reported alcohol and cocaine abuse.   Patient remains vaguely constricted, sad , but reports he is feeling better than on admission and is noted to be spending more time out of room, in milieu. At this time denies suicidal ideations , does not endorse current psychotic symptoms, and presents future oriented, interested in going to a rehab setting at discharge. Tolerating Remeron well thus far . No current significant ETOH WDL symptoms.   Treatment Plan Summary: Daily contact with patient to assess and evaluate symptoms and progress in treatment and Medication management  Treatment plan reviewed as below today 11/27. Encourage group and milieu participation Encourage efforts to work on sobriety and relapse prevention Treatment team working on disposition planning options- patient interested in going to rehab  Continue Remeron  15 mg nightly for depression/insomnia Continue Lopressor 12.5 mg twice daily for hypertension Continue Abilify 2 mg daily for antidepressant augmentation/psychosis Continue  Lidoderm 5% patch for low back pain   Jenne Campus, MD 09/28/2019, 3:45 PM    Patient ID: Robert Chan, male   DOB: 1956-07-12, 63 y.o.   MRN: [id number]

## 2019-09-28 NOTE — Plan of Care (Signed)
Progress note  D: pt found in the dayroom; compliant with medication administration. Pt is continuing to have issues with back pain. Pt feels as if they may overexerted themselves playing basketball. Pt is pleasant and seems to be less guarded today. Pt denies si/hi/ah/vh and verbally agrees to approach staff if these become apparent or before harming themself/others while at [place].  A: Pt provided support and encouragement. Pt given medication per protocol and standing orders. Q28m safety checks implemented and continued.  R: Pt safe on the unit. Will continue to monitor.  Pt progressing in the following metrics  Problem: Education: Goal: Emotional status will improve Outcome: Progressing Goal: Mental status will improve Outcome: Progressing Goal: Verbalization of understanding the information provided will improve Outcome: Progressing   Problem: Coping: Goal: Ability to verbalize frustrations and anger appropriately will improve Outcome: Progressing

## 2019-09-29 DIAGNOSIS — I10 Essential (primary) hypertension: Secondary | ICD-10-CM

## 2019-09-29 DIAGNOSIS — F1414 Cocaine abuse with cocaine-induced mood disorder: Secondary | ICD-10-CM

## 2019-09-29 DIAGNOSIS — R45851 Suicidal ideations: Secondary | ICD-10-CM

## 2019-09-29 DIAGNOSIS — R440 Auditory hallucinations: Secondary | ICD-10-CM

## 2019-09-29 DIAGNOSIS — G4700 Insomnia, unspecified: Secondary | ICD-10-CM

## 2019-09-29 MED ORDER — MIRTAZAPINE 30 MG PO TABS
30.0000 mg | ORAL_TABLET | Freq: Every day | ORAL | Status: DC
  Administered 2019-09-29: 30 mg via ORAL
  Filled 2019-09-29 (×5): qty 1

## 2019-09-29 NOTE — BHH Group Notes (Signed)
[place] LCSW Group Therapy Note  Date/Time:    09/29/2019 10:00-11:00AM  Type of Therapy and Topic:  Group Therapy:  Practicing Kindness to Self  Participation Level:  Active   Description of Group:  The focus of this group is to examine human tendencies to be hyper-critical of self and how this leads to feelings of worthlessness, hopelessness, and shame.  Patients were guided to the concept that shame is universal and is worsened by being kept hidden, but improved by being revealed.  We discussed how feeling unworthy is the result of shame and discussed the differences between guilt and shame.  It was shared why it is important to build the ability to tolerate discomfort, since numbing emotions is not selective and unfortunately results in numbing both painful and positive feelings.  Multiple exercises were led in a shortened version of several of the skills described.   Therapeutic Goals 1. Identify statements patients automatically say to themselves, "I'll be worthy when...." and examine how this is not kind to self because it indicates we cannot be worthy until some far-reaching goal is achieved. 2. Share current healthy and unhealthy coping skills used when feeling unworthy 3. Practice multiple coping skills including a. Questioning whether they feel guilt ("I did something bad" "I made a mistake" "I did something stupid") or shame ("I am bad" "I am a mistake" "I am stupid") and whether this feeling is based in fact. b. 5 senses mindfulness c. [place] In and Out  (Zooming in highlights our flaws and Zooming Out helps [place] to see ourselves more fully with flaws and assets) d. Grounding  e. AEIOUY (Abstinence, Exercise, I, Others, Unexpressed, Yay) f. TGIF (Trust, Gratitude, Inspiration, [place] or [place]) 4. Encourage to do the needed work to Estate manager/land agent, focusing on how medication is part of the solution to emotional and mental problems, while coping skills are necessary to actually  change behavior.  Summary of Patient Progress: During group, patient expressed that his main source of shame is that he is inconsistent with his goals and often screws up.  He stated he will feel worthy when he is able to change his bad thoughts.  He was mostly silent during group but watched and listened attentively.   Therapeutic Modalities Processing Lecture Activities   Robert Chan, [place] 09/29/2019, 2:12 PM

## 2019-09-29 NOTE — Progress Notes (Addendum)
D. Pt presents with a flat affect/depressed mood -calm and cooperative behavior- Per pt's self inventory, pt rated his depression, hopelessness and anxiety all 5's. Pt writes that his goal today is "staying positive". Pt currently denies SI/HI and AVH   A. Labs and vitals monitored. Pt compliant with medications. Pt supported emotionally and encouraged to express concerns and ask questions.   R. Pt remains safe with 15 minute checks. Will continue POC.

## 2019-09-29 NOTE — Progress Notes (Signed)
[facility] MD Progress Note  09/29/2019 3:22 PM Robert Chan  MRN:  [id number]   Subjective:  Patient describing gradual/ partial  Improvement. Denies SI. Future oriented at this time. Denies medication side effects.   Objective:  I have reviewed chart notes and have met with patient. 63 year old male, presented to ED on 11/22 reporting chest pain and worsening depression/suicidal ideations, with thoughts of shooting self and neurovegetative symptoms of depression.  He also reported auditory hallucinations.  Stressors included recent death of family members from [place] and unstable housing.  Reported drinking up to 80 ounces of beer on most days.  Admission UDS was positive for cocaine.  Patient is presenting with partial improvement compared to admission.  Today acknowledges he is feeling better than he did on admission.  Currently denies suicidal ideations and presents future oriented.  No longer endorsing hallucinations and does not appear internally preoccupied, no delusions are expressed, thought process is linear.  He is hoping to go to a rehab setting after discharge.  Does state he may be able to stay with a family member for a brief period of time should there be a waiting list for hiatus before he can get into a program. He has been visible on unit /dayroom, polite on approach, no agitated or disruptive behaviors. He is not presenting with symptoms of withdrawal at this time.  BP 132/91, pulse 70. Tolerating medications well thus far-he is currently on Remeron and low-dose Abilify.  Principal Problem: Depression Diagnosis: Active Problems:   Severe recurrent major depression without psychotic features (HCC)   Cocaine use disorder, mild, abuse ([place])  Total Time spent with patient: 15 minutes  Past Psychiatric History:   Past Medical History:  Past Medical History:  Diagnosis Date  . Asthma   . Cataract   . COPD (chronic obstructive pulmonary disease) ([place])   . Hypertension      Past Surgical History:  Procedure Laterality Date  . APPENDECTOMY    . EYE SURGERY    . PROSTATE SURGERY     Family History: History reviewed. No pertinent family history. Family Psychiatric  History:  Social History:  Social History   Substance and Sexual Activity  Alcohol Use Yes     Social History   Substance and Sexual Activity  Drug Use No    Social History   Socioeconomic History  . Marital status: Single    Spouse name: Not on file  . Number of children: Not on file  . Years of education: Not on file  . Highest education level: Not on file  Occupational History  . Not on file  Social Needs  . Financial resource strain: Not on file  . Food insecurity    Worry: Not on file    Inability: Not on file  . Transportation needs    Medical: Not on file    Non-medical: Not on file  Tobacco Use  . Smoking status: Current Some Day Smoker  . Smokeless tobacco: Never Used  Substance and Sexual Activity  . Alcohol use: Yes  . Drug use: No  . Sexual activity: Not on file  Lifestyle  . Physical activity    Days per week: Not on file    Minutes per session: Not on file  . Stress: Not on file  Relationships  . Social Herbalist on phone: Not on file    Gets together: Not on file    Attends religious service: Not on file    Active  member of club or organization: Not on file    Attends meetings of clubs or organizations: Not on file    Relationship status: Not on file  Other Topics Concern  . Not on file  Social History Narrative  . Not on file   Additional Social History:   Sleep: Good  Appetite: Improving  Current Medications: Current Facility-Administered Medications  Medication Dose Route Frequency Provider Last Rate Last Dose  . acetaminophen (TYLENOL) tablet 650 mg  650 mg Oral Q6H PRN Lindon Romp A, NP   650 mg at 09/27/19 1619  . alum & mag hydroxide-simeth (MAALOX/MYLANTA) 200-200-20 MG/5ML suspension 30 mL  30 mL Oral Q4H PRN Lindon Romp A, NP      . ARIPiprazole (ABILIFY) tablet 2 mg  2 mg Oral Daily Cobos, Myer Peer, MD   2 mg at 09/29/19 0828  . lidocaine (LIDODERM) 5 % 1 patch  1 patch Transdermal Daily Derrill Center, NP   1 patch at 09/29/19 0827  . magnesium hydroxide (MILK OF MAGNESIA) suspension 30 mL  30 mL Oral Daily PRN Lindon Romp A, NP      . metoprolol tartrate (LOPRESSOR) tablet 12.5 mg  12.5 mg Oral BID Cobos, Myer Peer, MD   12.5 mg at 09/29/19 0828  . mirtazapine (REMERON) tablet 15 mg  15 mg Oral QHS Cobos, Myer Peer, MD   15 mg at 09/28/19 2130  . mometasone-formoterol (DULERA) 200-5 MCG/ACT inhaler 2 puff  2 puff Inhalation BID Lindon Romp A, NP   2 puff at 09/29/19 0829  . multivitamin with minerals tablet 1 tablet  1 tablet Oral Daily Cobos, Myer Peer, MD   1 tablet at 09/29/19 [phone]  . thiamine (B-1) injection 100 mg  100 mg Intramuscular Once Cobos, Fernando A, MD      . thiamine (VITAMIN B-1) tablet 100 mg  100 mg Oral Daily Cobos, Myer Peer, MD   100 mg at 09/29/19 8242    Lab Results:  No results found for this or any previous visit (from the past 59 hour(s)).  Blood Alcohol level:  No results found for: [facility]  Metabolic Disorder Labs: Lab Results  Component Value Date   HGBA1C 5.1 09/25/2019   MPG 99.67 09/25/2019   No results found for: PROLACTIN Lab Results  Component Value Date   CHOL 187 09/25/2019   TRIG 69 09/25/2019   HDL 59 09/25/2019   CHOLHDL 3.2 09/25/2019   VLDL 14 09/25/2019   LDLCALC 114 (H) 09/25/2019    Physical Findings: AIMS: Facial and Oral Movements Muscles of Facial Expression: None, normal Lips and Perioral Area: None, normal Jaw: None, normal Tongue: None, normal,Extremity Movements Upper (arms, wrists, hands, fingers): None, normal Lower (legs, knees, ankles, toes): None, normal, Trunk Movements Neck, shoulders, hips: None, normal, Overall Severity Severity of abnormal movements (highest score from questions above): None,  normal Incapacitation due to abnormal movements: None, normal Patient's awareness of abnormal movements (rate only patient's report): No Awareness, Dental Status Current problems with teeth and/or dentures?: No Does patient usually wear dentures?: No  CIWA:  CIWA-Ar Total: 2 COWS:  COWS Total Score: 1  Musculoskeletal: Strength & Muscle Tone: within normal limits no tremors, no diaphoresis, no restlessness or agitation Gait & Station: normal Patient leans: N/A  Psychiatric Specialty Exam: Physical Exam  Vitals reviewed. Constitutional: He appears well-developed.  Psychiatric: He has a normal mood and affect.    Review of Systems  Psychiatric/Behavioral: Positive for depression. Negative for hallucinations and  suicidal ideas. The patient is nervous/anxious.   All other systems reviewed and are negative. no significant chest pain or shortness of breath at this time, no vomiting   Blood pressure (!) 132/91, pulse 70, temperature 97.7 F (36.5 C), temperature source Oral, resp. rate 18, height '6\' 4"'  (1.93 m), weight 108.9 kg, SpO2 98 %.Body mass index is 29.22 kg/m.  General Appearance: Casual  Eye Contact:  improving  Speech:  Normal Rate  Volume:  Normal  Mood:  Partially improved mood, acknowledges feeling better today  Affect:  Has remained vaguely constricted and anxious but today does smile briefly at times appropriately  Thought Process:  Linear and Descriptions of Associations: Intact  Orientation:  Other:  fully alert and attentive  Thought Content:  Suicidal Thoughts:  denies suicidal ideations at this time, contracts for safety on unit, no homicidal or violent ideations  Homicidal Thoughts:  No   Memory:  Recent and remote grossly intact  Judgement:  Fair/ improving   Insight:  Fair  Psychomotor Activity:  Normal -no psychomotor agitation or restlessness  Concentration:  Concentration: Good and Attention Span: Good  Recall:  Good  Fund of Knowledge:  Good  Language:   Good  Akathisia:  Negative  Handed:  Right  AIMS (if indicated):     Assets:  Desire for Improvement Resilience  ADL's:  Intact  Cognition:  WNL  Sleep:  Number of Hours: 6.75   Assessment -  45 y old man presented on 11/22 for chest pain with work up negative for cardiac event. Also endorsed depression, SI with thoughts of shooting self ,neurovegetative symptoms/anhedonia, vague auditory hallucinations. Reported recent death of loved ones due to Covid as a contributing factor for worsening depression. Reported alcohol and cocaine abuse.   Patient remains vaguely depressed and constricted in affect but describes feeling better compared to admission and states he is feeling less depressed today.  He denies suicidal ideations at this time.  He presents less ruminative and more future oriented, focusing on disposition planning options, particularly wanting to go to a rehab at discharge.  Thus far tolerating Remeron/Abilify well.  Denies side effects.  Treatment Plan Summary: Daily contact with patient to assess and evaluate symptoms and progress in treatment and Medication management  Treatment plan reviewed as below today 11/28 Encourage group and milieu participation Encourage efforts to work on sobriety and relapse prevention Treatment team working on disposition planning options- patient interested in going to rehab  Increase Remeron to 30 mg nightly for depression/insomnia Continue Lopressor 12.5 mg twice daily for hypertension Continue Abilify 2 mg daily for antidepressant augmentation/psychosis Continue  Lidoderm 5% patch for low back pain   Jenne Campus, MD 09/29/2019, 3:22 PM    Patient ID: Robert Chan, male   DOB: [birth year], 63 y.o.   MRN: [id number]

## 2019-09-29 NOTE — Progress Notes (Signed)
[facility] Group Notes:  (Nursing/MHT/Case Management/Adjunct)  Date:  09/29/2019  Time: 2030  Type of Therapy:  wrap up group  Participation Level:  Active  Participation Quality:  Appropriate, Attentive, Sharing and Supportive  Affect:  Depressed  Cognitive:  Appropriate  Insight:  Improving  Engagement in Group:  Engaged  Modes of Intervention:  Clarification, Education and Support  Summary of Progress/Problems:  Shellia Cleverly 09/29/2019, 9:21 PM

## 2019-09-29 NOTE — Progress Notes (Signed)
[place] NOVEL CORONAVIRUS (COVID-19) DAILY CHECK-OFF SYMPTOMS - answer yes or no to each - every day NO YES  Have you had a fever in the past 24 hours?  . Fever (Temp > 37.80C / 100F) X   Have you had any of these symptoms in the past 24 hours? . New Cough .  Sore Throat  .  Shortness of Breath .  Difficulty Breathing .  Unexplained Body Aches   X   Have you had any one of these symptoms in the past 24 hours not related to allergies?   . Runny Nose .  Nasal Congestion .  Sneezing   X   If you have had runny nose, nasal congestion, sneezing in the past 24 hours, has it worsened?  X   EXPOSURES - check yes or no X   Have you traveled outside the state in the past 14 days?  X   Have you been in contact with someone with a confirmed diagnosis of COVID-19 or PUI in the past 14 days without wearing appropriate PPE?  X   Have you been living in the same home as a person with confirmed diagnosis of COVID-19 or a PUI (household contact)?    X   Have you been diagnosed with COVID-19?    X              What to do next: Answered NO to all: Answered YES to anything:   Proceed with unit schedule Follow the [facility] Inpatient Flowsheet.   

## 2019-09-30 NOTE — Progress Notes (Signed)
[place] NOVEL CORONAVIRUS (COVID-19) DAILY CHECK-OFF SYMPTOMS - answer yes or no to each - every day NO YES  Have you had a fever in the past 24 hours?  . Fever (Temp > 37.80C / 100F) X   Have you had any of these symptoms in the past 24 hours? . New Cough .  Sore Throat  .  Shortness of Breath .  Difficulty Breathing .  Unexplained Body Aches   X   Have you had any one of these symptoms in the past 24 hours not related to allergies?   . Runny Nose .  Nasal Congestion .  Sneezing   X   If you have had runny nose, nasal congestion, sneezing in the past 24 hours, has it worsened?  X   EXPOSURES - check yes or no X   Have you traveled outside the state in the past 14 days?  X   Have you been in contact with someone with a confirmed diagnosis of COVID-19 or PUI in the past 14 days without wearing appropriate PPE?  X   Have you been living in the same home as a person with confirmed diagnosis of COVID-19 or a PUI (household contact)?    X   Have you been diagnosed with COVID-19?    X              What to do next: Answered NO to all: Answered YES to anything:   Proceed with unit schedule Follow the [facility] Inpatient Flowsheet.   

## 2019-09-30 NOTE — Progress Notes (Signed)
D.   Pt pleasant and appropriate on approach, no complaints voiced.  Pt was positive for evening wrap up group, observed engaged appropriately with peers on the unit.  Pt denies SI/HI/AVH at this time.  A.  Support and encouragement offered, medication given as ordered  R . Pt remains safe on the unit, will continue to monitor.

## 2019-09-30 NOTE — [facility] Group Notes (Signed)
[facility] LCSW Group Therapy Note  Date/Time:  09/30/2019  10:00AM-11:00PM  Type of Therapy and Topic:  Group Therapy:  Music's Effect on Depression and Anxiety  Participation Level:  Did Not Attend   Description of Group: In this process group, members discussed what types of music trigger them to worsening mental health symptoms and what they can do about this in the future.  For instance, we discussed what to do when riding in a friend's car and a song harmful to the patient starts playing.  We also discussed how music can be used as a tool to help with wellness and recovery in various ways including managing depression and anxiety as well as encouraging healthy sleep habits and avoiding relapse into old negative behaviors such as drinking, self-harming, or staying in bed all day.  Five songs were played as examples, including "Tell Your Heart To Beat Again," "Inward Journey," "Stand," "I Am Enough," and "The Fight Song."  Comments were elicited which showed the group to be in agreement that the songs were quite relatable and have the potential to help them outside of the hospital setting.  Therapeutic Goals: 1. Patients will be introduced to several specific songs that can relate to self-image and self-love in a helpful way. 2. Patients will explore the impact of different pieces of music on their feelings, i.e. uplifting, triggering, etc. 3. Patients will discuss how to use this self-knowledge to assist in recovery.  Summary of Patient Progress:  The patient was invited to attend group but chose not to do so..  Therapeutic Modalities: Solution Focused Brief Therapy Activity   Selmer Dominion, LCSW

## 2019-09-30 NOTE — Progress Notes (Signed)
[facility] MD Progress Note  09/30/2019 3:31 PM Robert Chan  MRN:  [id number]   Subjective:  States " I guess I am getting better". Denies medication side effects at this time. Currently denies suicidal ideations.  Objective:  I have reviewed chart notes and have met with patient. 63 year old male, presented to ED on 11/22 reporting chest pain and worsening depression/suicidal ideations, with thoughts of shooting self and neurovegetative symptoms of depression.  He also reported auditory hallucinations.  Stressors included recent death of family members from [place] and unstable housing.  Reported drinking up to 80 ounces of beer on most days.  Admission UDS was positive for cocaine.  Remains vaguely depressed, affect less constricted and becoming more reactive and today appeared more conversant, smiling appropriately at times during session. Currently denies suicidal or self injurious ideations and presenting future oriented , hoping to go to a rehab setting after discharge. Denies medication side effects . Currently on Remeron, which has been titrated to 30 mgrs QHS, and which he is tolerating well thus far. He is also on low dose Abilify for augmentation. Denies psychotic symptoms and does not appear internally preoccupied/no delusions are expressed/thought process is linear . Visible in day room, going to groups, pleasant /polite on approach.   Principal Problem: Depression Diagnosis: Active Problems:   Severe recurrent major depression without psychotic features (HCC)   Cocaine use disorder, mild, abuse ([place])  Total Time spent with patient: 15 minutes  Past Psychiatric History:   Past Medical History:  Past Medical History:  Diagnosis Date  . Asthma   . Cataract   . COPD (chronic obstructive pulmonary disease) ([place])   . Hypertension     Past Surgical History:  Procedure Laterality Date  . APPENDECTOMY    . EYE SURGERY    . PROSTATE SURGERY     Family History: History reviewed.  No pertinent family history. Family Psychiatric  History:  Social History:  Social History   Substance and Sexual Activity  Alcohol Use Yes     Social History   Substance and Sexual Activity  Drug Use No    Social History   Socioeconomic History  . Marital status: Single    Spouse name: Not on file  . Number of children: Not on file  . Years of education: Not on file  . Highest education level: Not on file  Occupational History  . Not on file  Social Needs  . Financial resource strain: Not on file  . Food insecurity    Worry: Not on file    Inability: Not on file  . Transportation needs    Medical: Not on file    Non-medical: Not on file  Tobacco Use  . Smoking status: Current Some Day Smoker  . Smokeless tobacco: Never Used  Substance and Sexual Activity  . Alcohol use: Yes  . Drug use: No  . Sexual activity: Not on file  Lifestyle  . Physical activity    Days per week: Not on file    Minutes per session: Not on file  . Stress: Not on file  Relationships  . Social Herbalist on phone: Not on file    Gets together: Not on file    Attends religious service: Not on file    Active member of club or organization: Not on file    Attends meetings of clubs or organizations: Not on file    Relationship status: Not on file  Other Topics Concern  .  Not on file  Social History Narrative  . Not on file   Additional Social History:   Sleep: Good  Appetite: Improving  Current Medications: Current Facility-Administered Medications  Medication Dose Route Frequency Provider Last Rate Last Dose  . acetaminophen (TYLENOL) tablet 650 mg  650 mg Oral Q6H PRN Lindon Romp A, NP   650 mg at 09/27/19 1619  . alum & mag hydroxide-simeth (MAALOX/MYLANTA) 200-200-20 MG/5ML suspension 30 mL  30 mL Oral Q4H PRN Lindon Romp A, NP      . ARIPiprazole (ABILIFY) tablet 2 mg  2 mg Oral Daily Astella Desir, Myer Peer, MD   2 mg at 09/30/19 3790  . lidocaine (LIDODERM) 5 % 1 patch   1 patch Transdermal Daily Derrill Center, NP   1 patch at 09/30/19 0813  . magnesium hydroxide (MILK OF MAGNESIA) suspension 30 mL  30 mL Oral Daily PRN Lindon Romp A, NP      . metoprolol tartrate (LOPRESSOR) tablet 12.5 mg  12.5 mg Oral BID Danaiya Steadman, Myer Peer, MD   12.5 mg at 09/30/19 2409  . mirtazapine (REMERON) tablet 30 mg  30 mg Oral QHS Keena Dinse, Myer Peer, MD   30 mg at 09/29/19 2101  . mometasone-formoterol (DULERA) 200-5 MCG/ACT inhaler 2 puff  2 puff Inhalation BID Lindon Romp A, NP   2 puff at 09/30/19 0813  . multivitamin with minerals tablet 1 tablet  1 tablet Oral Daily Keeana Pieratt, Myer Peer, MD   1 tablet at 09/30/19 [id number]  . thiamine (B-1) injection 100 mg  100 mg Intramuscular Once Navi Ewton A, MD      . thiamine (VITAMIN B-1) tablet 100 mg  100 mg Oral Daily Rayleigh Gillyard, Myer Peer, MD   100 mg at 09/30/19 2992    Lab Results:  No results found for this or any previous visit (from the past 19 hour(s)).  Blood Alcohol level:  No results found for: [facility]  Metabolic Disorder Labs: Lab Results  Component Value Date   HGBA1C 5.1 09/25/2019   MPG 99.67 09/25/2019   No results found for: PROLACTIN Lab Results  Component Value Date   CHOL 187 09/25/2019   TRIG 69 09/25/2019   HDL 59 09/25/2019   CHOLHDL 3.2 09/25/2019   VLDL 14 09/25/2019   LDLCALC 114 (H) 09/25/2019    Physical Findings: AIMS: Facial and Oral Movements Muscles of Facial Expression: None, normal Lips and Perioral Area: None, normal Jaw: None, normal Tongue: None, normal,Extremity Movements Upper (arms, wrists, hands, fingers): None, normal Lower (legs, knees, ankles, toes): None, normal, Trunk Movements Neck, shoulders, hips: None, normal, Overall Severity Severity of abnormal movements (highest score from questions above): None, normal Incapacitation due to abnormal movements: None, normal Patient's awareness of abnormal movements (rate only patient's report): No Awareness, Dental Status Current  problems with teeth and/or dentures?: No Does patient usually wear dentures?: No  CIWA:  CIWA-Ar Total: 1 COWS:  COWS Total Score: 1  Musculoskeletal: Strength & Muscle Tone: within normal limits no tremors, no diaphoresis, no restlessness or agitation Gait & Station: normal Patient leans: N/A  Psychiatric Specialty Exam: Physical Exam  Vitals reviewed. Constitutional: He appears well-developed.  Psychiatric: He has a normal mood and affect.    Review of Systems  Psychiatric/Behavioral: Positive for depression. Negative for hallucinations and suicidal ideas. The patient is nervous/anxious.   All other systems reviewed and are negative. no headache, no chest pain, no shortness of breath at room air   Blood pressure (!) 132/96, pulse  81, temperature 98.3 F (36.8 C), temperature source Oral, resp. rate 18, height _0  (1.93 m), weight 108.9 kg, SpO2 100 %.Body mass index is 29.22 kg/m.  General Appearance: Casual  Eye Contact:  improving  Speech:  Normal Rate  Volume:  Normal  Mood:  improving mood, reports he is feeling better than on admission  Affect:  becoming more reactive  Thought Process:  Linear and Descriptions of Associations: Intact  Orientation:  Other:  fully alert and attentive  Thought Content: denies hallucinations, no delusions are expressed at this time and does not appear internally preoccupied  Suicidal Thoughts:  denies suicidal ideations at this time, contracts for safety on unit, no homicidal or violent ideations  Homicidal Thoughts:  No   Memory:  Recent and remote grossly intact  Judgement:  Fair/ improving   Insight:  Fair  Psychomotor Activity:  Normal -no psychomotor agitation or restlessness  Concentration:  Concentration: Good and Attention Span: Good  Recall:  Good  Fund of Knowledge:  Good  Language:  Good  Akathisia:  Negative  Handed:  Right  AIMS (if indicated):     Assets:  Desire for Improvement Resilience  ADL's:  Intact  Cognition:   WNL  Sleep:  Number of Hours: 5.5   Assessment -  14 y old man presented on 11/22 for chest pain with work up negative for cardiac event. Also endorsed depression, SI with thoughts of shooting self ,neurovegetative symptoms/anhedonia, vague auditory hallucinations. Reported recent death of loved ones due to Covid as a contributing factor for worsening depression. Reported alcohol and cocaine abuse.   Patient presenting with gradual improvement. Acknowledges improving mood and affect presents less constricted /more reactive. No psychotic symptoms, no SI, future oriented , hoping to go to a rehab setting after discharge. Although hoping he will be able to [facility] directly [facility] he is aware there may be waiting periods and states he would be able to live with his daughter for a period of time until rehab bed becomes available.  Treatment Plan Summary: Daily contact with patient to assess and evaluate symptoms and progress in treatment and Medication management  Treatment plan reviewed as below today 11/29 Encourage group and milieu participation Encourage efforts to work on sobriety and relapse prevention Treatment team working on disposition planning options- patient interested in going to rehab  Continue Remeron  30 mg nightly for depression/insomnia Continue Lopressor 12.5 mg twice daily for hypertension Continue Abilify 2 mg daily for antidepressant augmentation/psychosis Continue  Lidoderm 5% patch for low back pain   Jenne Campus, MD 09/30/2019, 3:31 PM    Patient ID: [name], male   DOB: 1956-04-25, 78 y.o.   MRN: [id number] Patient ID: Ranier Coach, male   DOB: 1955-11-20, 64 y.o.   MRN: [id number]

## 2019-09-30 NOTE — Progress Notes (Signed)
D. Pt presents as depressed- brightens a little during interactions. Pt observed this am quietly sitting in the dayroom-Per pt's self inventory, pt rated his depression, hopelessness and anxiety an 8/1/2, respectively. Pt currently denies SI/HI and AVH  A. Labs and vitals monitored. Pt compliant with medications. Pt supported emotionally and encouraged to express concerns and ask questions.   R. Pt remains safe with 15 minute checks. Will continue POC.

## 2019-10-01 MED ORDER — ADULT MULTIVITAMIN W/MINERALS CH: 1.0000 | ORAL_TABLET | Freq: Every day | ORAL | Status: DC

## 2019-10-01 MED ORDER — LIDOCAINE 5 % EX PTCH: 1.0000 | MEDICATED_PATCH | Freq: Every day | CUTANEOUS | 0 refills | Status: DC

## 2019-10-01 MED ORDER — MOMETASONE FURO-FORMOTEROL FUM 200-5 MCG/ACT IN AERO: 2.0000 | INHALATION_SPRAY | Freq: Two times a day (BID) | RESPIRATORY_TRACT | 0 refills | Status: DC

## 2019-10-01 MED ORDER — METOPROLOL TARTRATE 25 MG PO TABS: 12.5000 mg | ORAL_TABLET | Freq: Two times a day (BID) | ORAL | 0 refills | Status: DC

## 2019-10-01 MED ORDER — MIRTAZAPINE 30 MG PO TABS: 30.0000 mg | ORAL_TABLET | Freq: Every day | ORAL | 0 refills | Status: DC

## 2019-10-01 MED ORDER — ARIPIPRAZOLE 2 MG PO TABS: 2.0000 mg | ORAL_TABLET | Freq: Every day | ORAL | 0 refills | Status: DC

## 2019-10-01 NOTE — Tx Team (Signed)
Interdisciplinary Treatment and Diagnostic Plan Update  10/01/2019 Time of Session: 9:00am Robert Chan MRN: [id number]  Principal Diagnosis: <principal problem not specified>  Secondary Diagnoses: Active Problems:   Severe recurrent major depression without psychotic features (HCC)   Cocaine use disorder, mild, abuse (HCC)   Current Medications:  Current Facility-Administered Medications  Medication Dose Route Frequency Provider Last Rate Last Dose  . acetaminophen (TYLENOL) tablet 650 mg  650 mg Oral Q6H PRN Lindon Romp A, NP   650 mg at 09/27/19 1619  . alum & mag hydroxide-simeth (MAALOX/MYLANTA) 200-200-20 MG/5ML suspension 30 mL  30 mL Oral Q4H PRN Lindon Romp A, NP      . ARIPiprazole (ABILIFY) tablet 2 mg  2 mg Oral Daily Cobos, Myer Peer, MD   2 mg at 10/01/19 0824  . lidocaine (LIDODERM) 5 % 1 patch  1 patch Transdermal Daily Derrill Center, NP   1 patch at 10/01/19 0817  . magnesium hydroxide (MILK OF MAGNESIA) suspension 30 mL  30 mL Oral Daily PRN Lindon Romp A, NP      . metoprolol tartrate (LOPRESSOR) tablet 12.5 mg  12.5 mg Oral BID Cobos, Myer Peer, MD   12.5 mg at 10/01/19 [id number]  . mirtazapine (REMERON) tablet 30 mg  30 mg Oral QHS Cobos, Myer Peer, MD   30 mg at 09/29/19 2101  . mometasone-formoterol (DULERA) 200-5 MCG/ACT inhaler 2 puff  2 puff Inhalation BID Lindon Romp A, NP   2 puff at 10/01/19 [phone]  . multivitamin with minerals tablet 1 tablet  1 tablet Oral Daily Cobos, Myer Peer, MD   1 tablet at 10/01/19 0819  . thiamine (B-1) injection 100 mg  100 mg Intramuscular Once Cobos, Fernando A, MD      . thiamine (VITAMIN B-1) tablet 100 mg  100 mg Oral Daily Cobos, Myer Peer, MD   100 mg at 10/01/19 [id number]   PTA Medications: Medications Prior to Admission  Medication Sig Dispense Refill Last Dose  . albuterol (PROVENTIL HFA;VENTOLIN HFA) 108 (90 Base) MCG/ACT inhaler Inhale 1-2 puffs every 6 (six) hours as needed into the lungs for wheezing or shortness  of breath. 1 Inhaler 0   . albuterol (PROVENTIL HFA;VENTOLIN HFA) 108 (90 Base) MCG/ACT inhaler Inhale 1-2 puffs every 6 (six) hours as needed into the lungs for wheezing or shortness of breath. (Patient not taking: Reported on 09/23/2019) 1 Inhaler 1   . amoxicillin-clavulanate (AUGMENTIN) 875-125 MG tablet Take 1 tablet by mouth 2 (two) times daily.     . budesonide-formoterol (SYMBICORT) 160-4.5 MCG/ACT inhaler Inhale 2 puffs 2 (two) times daily into the lungs. 1 Inhaler 0   . budesonide-formoterol (SYMBICORT) 160-4.5 MCG/ACT inhaler Inhale 2 puffs 2 (two) times daily into the lungs. (Patient not taking: Reported on 09/23/2019) 1 Inhaler 0   . clotrimazole (LOTRIMIN) 1 % cream Apply to affected area 2 times daily (Patient not taking: Reported on 09/23/2019) 15 g 0   . erythromycin ophthalmic ointment See admin instructions. Place a 1/2 inch ribbon of ointment into the lower eyelid. Use every 6 hours for one week       Patient Stressors: Financial difficulties Marital or family conflict Occupational concerns  Patient Strengths: Average or above average intelligence Capable of independent living Motivation for treatment/growth Work skills  Treatment Modalities: Medication Management, Group therapy, Case management,  1 to 1 session with clinician, Psychoeducation, Recreational therapy.   Physician Treatment Plan for Primary Diagnosis: <principal problem not specified> Long Term Goal(s): Improvement in symptoms  so as ready for discharge Improvement in symptoms so as ready for discharge   Short Term Goals: Ability to identify changes in lifestyle to reduce recurrence of condition will improve Ability to verbalize feelings will improve Ability to disclose and discuss suicidal ideas Ability to demonstrate self-control will improve Ability to identify and develop effective coping behaviors will improve  Medication Management: Evaluate patient's response, side effects, and tolerance of  medication regimen.  Therapeutic Interventions: 1 to 1 sessions, Unit Group sessions and Medication administration.  Evaluation of Outcomes: Adequate for Discharge  Physician Treatment Plan for Secondary Diagnosis: Active Problems:   Severe recurrent major depression without psychotic features (HCC)   Cocaine use disorder, mild, abuse ([place])  Long Term Goal(s): Improvement in symptoms so as ready for discharge Improvement in symptoms so as ready for discharge   Short Term Goals: Ability to identify changes in lifestyle to reduce recurrence of condition will improve Ability to verbalize feelings will improve Ability to disclose and discuss suicidal ideas Ability to demonstrate self-control will improve Ability to identify and develop effective coping behaviors will improve     Medication Management: Evaluate patient's response, side effects, and tolerance of medication regimen.  Therapeutic Interventions: 1 to 1 sessions, Unit Group sessions and Medication administration.  Evaluation of Outcomes: Adequate for Discharge   RN Treatment Plan for Primary Diagnosis: <principal problem not specified> Long Term Goal(s): Knowledge of disease and therapeutic regimen to maintain health will improve  Short Term Goals: Ability to disclose and discuss suicidal ideas and Ability to identify and develop effective coping behaviors will improve  Medication Management: RN will administer medications as ordered by provider, will assess and evaluate patient's response and provide education to patient for prescribed medication. RN will report any adverse and/or side effects to prescribing provider.  Therapeutic Interventions: 1 on 1 counseling sessions, Psychoeducation, Medication administration, Evaluate responses to treatment, Monitor vital signs and CBGs as ordered, Perform/monitor CIWA, COWS, AIMS and Fall Risk screenings as ordered, Perform wound care treatments as ordered.  Evaluation of Outcomes:  Adequate for Discharge   LCSW Treatment Plan for Primary Diagnosis: <principal problem not specified> Long Term Goal(s): Safe transition to appropriate next level of care at discharge, Engage patient in therapeutic group addressing interpersonal concerns.  Short Term Goals: Engage patient in aftercare planning with referrals and resources, Increase social support, Increase emotional regulation, Identify triggers associated with mental health/substance abuse issues and Increase skills for wellness and recovery  Therapeutic Interventions: Assess for all discharge needs, 1 to 1 time with Social worker, Explore available resources and support systems, Assess for adequacy in community support network, Educate family and significant other(s) on suicide prevention, Complete Psychosocial Assessment, Interpersonal group therapy.  Evaluation of Outcomes: Adequate for Discharge  Progress in Treatment: Attending groups: Yes. Participating in groups: Yes. Taking medication as prescribed: Yes. Toleration medication: Yes. Family/Significant other contact made: No, will contact:  SPE reviewed with patient.  Patient understands diagnosis: Yes. Discussing patient identified problems/goals with staff: Yes. Medical problems stabilized or resolved: Yes. Denies suicidal/homicidal ideation: No. Issues/concerns per patient self-inventory: Yes.  New problem(s) identified: Yes, Describe:  family stressors, housing instability  New Short Term/Long Term Goal(s): detox, medication management for mood stabilization; elimination of SI thoughts; development of comprehensive mental wellness/sobriety plan.  Patient Goals: Get a good discharge plans with follow up, get set up with meds.  Discharge Plan or Barriers: Patient plans to discharge home with his daughter. He will follow up with [facility] for outpatient medication management  and therapy services.   Reason for Continuation of Hospitalization:   Estimated Length  of Stay: 10/31/2019  Attendees: Patient: Robert Chan 10/01/2019 10:44 AM  Physician: Dr. Neita Garnet, MD 10/01/2019 10:44 AM  Nursing: Legrand Como, RN 10/01/2019 10:44 AM  RN Care Manager: 10/01/2019 10:44 AM  Social Worker: Stephanie Acre, [place]; [place], [place] 10/01/2019 10:44 AM  Recreational Therapist:  10/01/2019 10:44 AM  Other: Harriett Sine, NP 10/01/2019 10:44 AM  Other:  10/01/2019 10:44 AM  Other: 10/01/2019 10:44 AM    Scribe for Treatment Team: Marylee Floras, [place] 10/01/2019 10:44 AM

## 2019-10-01 NOTE — [facility] Suicide Risk Assessment (Addendum)
[facility] Discharge Suicide Risk Assessment   Principal Problem: Depression, Substance Induced Mood Disorder  Discharge Diagnoses: Active Problems:   Severe recurrent major depression without psychotic features (HCC)   Cocaine use disorder, mild, abuse ([place])   Total Time spent with patient: 30 minutes  Musculoskeletal: Strength & Muscle Tone: within normal limits Gait & Station: normal Patient leans: N/A  Psychiatric Specialty Exam: ROS denies headache, no chest pain, no shortness of breath, no vomiting  Blood pressure (!) 152/105, pulse 72, temperature 98.3 F (36.8 C), temperature source Oral, resp. rate 18, height 6\' 4"  (1.93 m), weight 108.9 kg, SpO2 100 %.Body mass index is 29.22 kg/m.  General Appearance: Improved grooming  Eye Contact::  Good  Speech:  Normal Rate409  Volume:  Normal  Mood:  Reports improved mood, states "I am feeling better"  Affect:  Appropriate/reactive  Thought Process:  Linear and Descriptions of Associations: Intact  Orientation:  Full (Time, Place, and Person)  Thought Content:  Currently denies hallucinations, no delusions are expressed, does not appear internally preoccupied  Suicidal Thoughts:  No no suicidal or self-injurious ideations, no homicidal or violent ideations  Homicidal Thoughts:  No  Memory:  Recent and remote grossly intact  Judgement:  Other:  Improving  Insight:  Improving  Psychomotor Activity:  Normal no psychomotor agitation or restlessness  Concentration:  Good  Recall:  Good  Fund of Knowledge:Good  Language: Good  Akathisia:  Negative  Handed:  Right  AIMS (if indicated):     Assets:  Desire for Improvement Resilience  Sleep:  Number of Hours: 6.25  Cognition: WNL  ADL's:  Intact   Mental Status Per Nursing Assessment::   On Admission:  Suicidal ideation indicated by patient, Suicidal ideation indicated by others  Demographic Factors:  63 year old male, separated, on disability  Loss Factors: Unstable housing,  loss of loved ones from Covid earlier this year, substance abuse  Historical Factors: History of depression, history of cocaine and alcohol use disorder  Risk Reduction Factors:   Positive coping skills or problem solving skills  Continued Clinical Symptoms:  This time patient is alert, attentive, calm, pleasant on approach, appears calm and in no acute distress, expresses readiness for discharge.  Mood is improved, affect is more reactive, brighter, no thought disorder, denies suicidal or self-injurious ideations, no homicidal or violent ideations, no hallucinations, no delusions, oriented x3, future oriented. Behavior on unit in good control, pleasant on approach. Currently denies medication side effects.  (On Remeron-has tolerated titration to 30 mg nightly well-and on low-dose Abilify for augmentation at 2 mg daily) No current symptoms of withdrawal, appears calm and in no acute distress.  No tremors, no restlessness or agitation  Cognitive Features That Contribute To Risk:  No gross cognitive deficits noted upon discharge. Is alert , attentive, and oriented x 3   Suicide Risk:  Mild:  Suicidal ideation of limited frequency, intensity, duration, and specificity.  There are no identifiable plans, no associated intent, mild dysphoria and related symptoms, good self-control (both objective and subjective assessment), few other risk factors, and identifiable protective factors, including available and accessible social support.  Follow-up Information    [facility] Go on 10/02/2019.   Why: Your hospital discharge appointment is scheduled for 12/01 at 8:45am. Your appointment will be held in person. Please bring hospital discharge paperwork, current medications, photo ID, and proof of insurance. Wear a mask! Contact information: [address] [place] [address] [phone]  Plan Of Care/Follow-up recommendations:  Activity:  As tolerated Diet:  Heart healthy Tests:   NA Other:  See below  Patient is expressing readiness for discharge.  Has to go stay with his daughter for period time.  Plans to possibly go to a rehab setting in the near future but for now plans to follow-up with [facility] for outpatient treatment. Encouraged to attend 12-step meetings regularly. Patient encouraged to follow up with [facility] for medical management .  Have reviewed the negative impact cocaine use can have on mental and physical/cardiovascular health.  Patient expresses awareness/understanding and states he is motivated in abstinence at this time.    Jenne Campus, MD 10/01/2019, 1:30 PM
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
## 2019-10-01 NOTE — Progress Notes (Signed)
D.  Pt pleasant on approach, no complaints voiced.  Pt was positive for evening wrap up group, observed engaged appropriately with peers on the unit.  Pt went to bed after group and did not come back up for Remeron that was ordered.  He appeared to be sleeping.  Pt denies SI/HI/AVH at this time.  A.  Support and encouragement offered  R.  Pt remains safe on the unit, will continue to monitor.

## 2019-10-01 NOTE — Progress Notes (Signed)
Discharge Note:  Patient discharged home.  Patient denied SI and HI.  Denied A/V hallucinations.  Suicide prevention information given and discussed with patient who stated he understood and had no questions. Patient stated he received all his belongings, clothing, toiletries, misc items, etc.  Patient stated he appreciated all assistance received from [facility] staff.  All required discharge information given to patient at discharge.

## 2019-10-01 NOTE — Progress Notes (Signed)
[place] NOVEL CORONAVIRUS (COVID-19) DAILY CHECK-OFF SYMPTOMS - answer yes or no to each - every day NO YES  Have you had a fever in the past 24 hours?  . Fever (Temp > 37.80C / 100F) X   Have you had any of these symptoms in the past 24 hours? . New Cough .  Sore Throat  .  Shortness of Breath .  Difficulty Breathing .  Unexplained Body Aches   X   Have you had any one of these symptoms in the past 24 hours not related to allergies?   . Runny Nose .  Nasal Congestion .  Sneezing   X   If you have had runny nose, nasal congestion, sneezing in the past 24 hours, has it worsened?  X   EXPOSURES - check yes or no X   Have you traveled outside the state in the past 14 days?  X   Have you been in contact with someone with a confirmed diagnosis of COVID-19 or PUI in the past 14 days without wearing appropriate PPE?  X   Have you been living in the same home as a person with confirmed diagnosis of COVID-19 or a PUI (household contact)?    X   Have you been diagnosed with COVID-19?    X              What to do next: Answered NO to all: Answered YES to anything:   Proceed with unit schedule Follow the [facility] Inpatient Flowsheet.   

## 2019-10-01 NOTE — Progress Notes (Signed)
Recreation Therapy Notes  Date:  11.30.20 Time: 0930 Location: [address] Dayroom  Group Topic: Stress Management  Goal Area(s) Addresses:  Patient will identify positive stress management techniques. Patient will identify benefits of using stress management post d/c.  Intervention: Stress Management  Activity : Meditation.  LRT played a meditation that focused on making choices.  Patients were to listen and follow along as meditation played to engage in activity.  Education:  Stress Management, Discharge Planning.   Education Outcome: Acknowledges Education  Clinical Observations/Feedback:  Pt did not attend group.    Victorino Sparrow, LRT/CTRS         Victorino Sparrow A 10/01/2019 11:15 AM

## 2019-10-01 NOTE — Progress Notes (Signed)
Spiritual care group on grief and loss facilitated by chaplain Jerene Pitch MDiv, [facility]  Group Goal:  Support / Education around grief and loss Members engage in facilitated group support and psycho-social education.  Group Description:  Following introductions and group rules, group members engaged in facilitated group dialog and support around topic of loss, with particular support around experiences of loss in their lives. Group Identified types of loss (relationships / self / things) and identified patterns, circumstances, and changes that precipitate losses. Reflected on thoughts / feelings around loss, normalized grief responses, and recognized variety in grief experience.   Group noted Worden's four tasks of grief in discussion.  Group drew on Adlerian / Rogerian, narrative, MI, Patient Progress:  DID NOT ATTEND

## 2019-10-01 NOTE — Progress Notes (Signed)
  [facility] Adult Case Management Discharge Plan :  Will you be returning to the same living situation after discharge:  No. Patient reports he is staying with his adult daughter temporarily.  At discharge, do you have transportation home?: Yes,  [employer]) transport to [employer] parking lot Do you have the ability to pay for your medications: Yes,  [employer] Medicare  Release of information consent forms completed and in the chart;  Patient's signature needed at discharge.  Patient to Follow up at: Follow-up Information    [facility] on 10/02/2019.   Why: Your hospital discharge appointment is scheduled for 12/01 at 8:45am. Your appointment will be held in person. Please bring hospital discharge paperwork, current medications, photo ID, and proof of insurance. Wear a mask! Contact information: [address] [place] [address] [phone]           Next level of care provider has access to [place] and Suicide Prevention discussed: Yes,  with the patient   Have you used any form of tobacco in the last 30 days? (Cigarettes, Smokeless Tobacco, Cigars, and/or Pipes): Yes  Has patient been referred to the Quitline?: Patient refused referral  Patient has been referred for addiction treatment: Yes  Marylee Floras, [place] 10/01/2019, 10:08 AM

## 2019-10-01 NOTE — Progress Notes (Signed)
D:  Patient's self inventory sheet, patient sleeps good, sleep medication helpful.  Good appetite, normal energy level, good concentration.  Rated depression 8, hopeless 7, anxiety 6.  Denied withdrawals.  Denied SI.  Denied physical problems.  Denied physical pain.  Goal is be positive.  Plans to stay positive.  Does have discharge plans.   A:  Medications administered per MD orders.  Emotional support and encouragement given patient. R:  Denied SI and HI, contracts for safety.  Denied A/V hallucinations.  Safety maintained with 15 minute checks.

## 2019-10-06 NOTE — [facility] Counselor (Signed)
Clinical Social Work Note  Patient left a message on [employer] stating he was not able to handle being out of the hospital and did not know what he would do.  After consultation with TTS, CSW found his number on the phone system and called him back, left a voicemail with CSW phone and assessment phone number.  He called back about 10 minutes later, said he can't stay clean by himself, needs to be in a rehab.  He said he had been calling rehabs like he was told to do.  Somebody came to the door, CSW heard a conversation about purchasing something for $1200, then the phone was hung up.  He did not express any SI, and was given the Assessment phone number on voicemail.  Selmer Dominion, LCSW 10/06/2019, 4:22 PM

## 2019-10-29 ENCOUNTER — Ambulatory Visit (HOSPITAL_COMMUNITY)
Admission: EM | Admit: 2019-10-29 | Discharge: 2019-10-29 | Disposition: A | Discharge status: Home / Self Care | Payer: Medicare HMO | Attending: Emergency Medicine | Admitting: Emergency Medicine

## 2019-10-29 ENCOUNTER — Encounter (HOSPITAL_COMMUNITY): Payer: Self-pay

## 2019-10-29 ENCOUNTER — Other Ambulatory Visit: Payer: Self-pay

## 2019-10-29 DIAGNOSIS — U071 COVID-19: Secondary | ICD-10-CM

## 2019-10-29 DIAGNOSIS — R05 Cough: Secondary | ICD-10-CM

## 2019-10-29 DIAGNOSIS — R000 Tachycardia, unspecified: Secondary | ICD-10-CM | POA: Diagnosis not present

## 2019-10-29 DIAGNOSIS — J45901 Unspecified asthma with (acute) exacerbation: Secondary | ICD-10-CM | POA: Diagnosis not present

## 2019-10-29 DIAGNOSIS — T732XXA Exhaustion due to exposure, initial encounter: Secondary | ICD-10-CM

## 2019-10-29 DIAGNOSIS — R0602 Shortness of breath: Secondary | ICD-10-CM

## 2019-10-29 DIAGNOSIS — R059 Cough, unspecified: Secondary | ICD-10-CM

## 2019-10-29 LAB — POC SARS CORONAVIRUS 2 AG: SARS Coronavirus 2 Ag: POSITIVE — AB

## 2019-10-29 LAB — POC SARS CORONAVIRUS 2 AG -  ED: SARS Coronavirus 2 Ag: POSITIVE — AB

## 2019-10-29 MED ORDER — ALBUTEROL SULFATE HFA 108 (90 BASE) MCG/ACT IN AERS: 1.0000 | INHALATION_SPRAY | Freq: Four times a day (QID) | RESPIRATORY_TRACT | 2 refills | Status: DC | PRN

## 2019-10-29 MED ORDER — PREDNISONE 10 MG (21) PO TBPK: ORAL_TABLET | Freq: Every day | ORAL | 0 refills | Status: DC

## 2019-10-29 NOTE — Discharge Instructions (Addendum)
Steroids and inhaler are for asmtha sx not for dx of covid  Your covid test was positive  You need to quarantine yourself for 10 days. If symptoms become worse you will need to go  to the ER.  Stay hydrated well

## 2019-10-29 NOTE — ED Triage Notes (Signed)
Pt. Is here with fatigue, mild cough since Saturday night.

## 2019-10-29 NOTE — ED Provider Notes (Signed)
[place]    CSN: [id number] Arrival date & time: 10/29/19  1748      History   Chief Complaint Chief Complaint  Patient presents with  . Generalized Body Aches  . Cough    HPI Robert Chan is a 63 y.o. male.   Pt has been at home with family members with fever and cough for the past 3 days now getting worse. Pt has hx of asthma and states the use of the inhaler has not worked for him. Still cont to feel fatigue, cough,  Sob, low grade fever.      Past Medical History:  Diagnosis Date  . Asthma   . Cataract   . COPD (chronic obstructive pulmonary disease) ([place])   . Hypertension     Patient Active Problem List   Diagnosis Date Noted  . Severe recurrent major depression without psychotic features ([place]) 09/24/2019  . Cocaine use disorder, mild, abuse ([place])     Past Surgical History:  Procedure Laterality Date  . APPENDECTOMY    . EYE SURGERY    . PROSTATE SURGERY         Home Medications    Prior to Admission medications   Medication Sig Start Date End Date Taking? Authorizing Provider  albuterol (VENTOLIN HFA) 108 (90 Base) MCG/ACT inhaler Inhale 1-2 puffs into the lungs every 6 (six) hours as needed for wheezing or shortness of breath. 10/29/19   Marney Setting, NP  ARIPiprazole (ABILIFY) 2 MG tablet Take 1 tablet (2 mg total) by mouth daily. 10/02/19   Connye Burkitt, NP  lidocaine (LIDODERM) 5 % Place 1 patch onto the skin daily. Remove & Discard patch within 12 hours or as directed by MD 10/02/19   Connye Burkitt, NP  metoprolol tartrate (LOPRESSOR) 25 MG tablet Take 0.5 tablets (12.5 mg total) by mouth 2 (two) times daily. 10/01/19   Connye Burkitt, NP  mirtazapine (REMERON) 30 MG tablet Take 1 tablet (30 mg total) by mouth at bedtime. 10/01/19   Connye Burkitt, NP  mometasone-formoterol (DULERA) 200-5 MCG/ACT AERO Inhale 2 puffs into the lungs 2 (two) times daily. 10/01/19   Connye Burkitt, NP  Multiple Vitamin (MULTIVITAMIN WITH  MINERALS) TABS tablet Take 1 tablet by mouth daily. 10/02/19   Connye Burkitt, NP  predniSONE (STERAPRED UNI-PAK 21 TAB) 10 MG (21) TBPK tablet Take by mouth daily. Take 6 tabs by mouth daily  for 2 days, then 5 tabs for 2 days, then 4 tabs for 2 days, then 3 tabs for 2 days, 2 tabs for 2 days, then 1 tab by mouth daily for 2 days 10/29/19   Marney Setting, NP    Family History Family History  Problem Relation Age of Onset  . Hypertension Mother   . Diabetes Mother   . Hypertension Father   . Hypertension Sister     Social History Social History   Tobacco Use  . Smoking status: Current Some Day Smoker  . Smokeless tobacco: Never Used  Substance Use Topics  . Alcohol use: Yes  . Drug use: No     Allergies   Patient has no known allergies.   Review of Systems Review of Systems  Constitutional: Positive for fatigue and fever.  HENT: Negative.   Eyes: Negative.   Respiratory: Positive for cough and shortness of breath.   Cardiovascular: Negative.   Gastrointestinal: Positive for diarrhea.  Genitourinary: Negative.   Musculoskeletal: Negative.   Skin: Negative.  Neurological: Positive for weakness.     Physical Exam Triage Vital Signs ED Triage Vitals  Enc Vitals Group     BP 10/29/19 1856 137/85     Pulse Rate 10/29/19 1856 99     Resp 10/29/19 1856 18     Temp 10/29/19 1856 99.9 F (37.7 C)     Temp Source 10/29/19 1856 Oral     SpO2 10/29/19 1856 97 %     Weight 10/29/19 1854 259 lb (117.5 kg)     Height --      Head Circumference --      Peak Flow --      Pain Score 10/29/19 1853 0     Pain Loc --      Pain Edu? --      Excl. in [place]? --    No data found.  Updated Vital Signs BP 137/85 (BP Location: Right Arm)   Pulse 99   Temp 99.9 F (37.7 C) (Oral)   Resp 18   Wt 259 lb (117.5 kg)   SpO2 97%   BMI 31.53 kg/m   Visual Acuity     Physical Exam HENT:     Head: Normocephalic.  Eyes:     Pupils: Pupils are equal, round, and reactive  to light.  Cardiovascular:     Rate and Rhythm: Tachycardia present.  Pulmonary:     Breath sounds: Wheezing present.  Abdominal:     General: Abdomen is flat.  Musculoskeletal:        General: Normal range of motion.     Cervical back: Normal range of motion.  Skin:    General: Skin is warm.     Capillary Refill: Capillary refill takes less than 2 seconds.  Neurological:     General: No focal deficit present.     Mental Status: He is alert.      UC Treatments / Results  Labs (all labs ordered are listed, but only abnormal results are displayed) Labs Reviewed  POC SARS CORONAVIRUS 2 AG -  ED - Abnormal; Notable for the following components:      Result Value   SARS Coronavirus 2 Ag POSITIVE (*)    All other components within normal limits  POC SARS CORONAVIRUS 2 AG - Abnormal; Notable for the following components:   SARS Coronavirus 2 Ag POSITIVE (*)    All other components within normal limits  NOVEL CORONAVIRUS, NAA (HOSP ORDER, SEND-OUT TO REF LAB; TAT 18-24 HRS)    EKG   Radiology No results found.  Procedures Procedures (including critical care time)  Medications Ordered in UC Medications - No data to display  Initial Impression / Assessment and Plan / UC Course  I have reviewed the triage vital signs and the nursing notes.  Pertinent labs & imaging results that were available during my care of the patient were reviewed by me and considered in my medical decision making (see chart for details).     Steroids and inhaler are for asmtha sx not for dx of covid  Your covid test was positive  You need to quarantine yourself for 10 days. If symptoms become worse you will need to go  to the ER.  Stay hydrated well  Final Clinical Impressions(s) / UC Diagnoses   Final diagnoses:  Cough  SOB (shortness of breath)  Fatigue due to exposure, initial encounter  COVID-19 virus infection     Discharge Instructions     Steroids and inhaler are for asmtha sx  not for dx of covid  Your covid test was positive  You need to quarantine yourself for 10 days. If symptoms become worse you will need to go  to the ER.  Stay hydrated well     ED Prescriptions    Medication Sig Dispense Auth. Provider   predniSONE (STERAPRED UNI-PAK 21 TAB) 10 MG (21) TBPK tablet Take by mouth daily. Take 6 tabs by mouth daily  for 2 days, then 5 tabs for 2 days, then 4 tabs for 2 days, then 3 tabs for 2 days, 2 tabs for 2 days, then 1 tab by mouth daily for 2 days 42 tablet Morley Kos L, NP   albuterol (VENTOLIN HFA) 108 (90 Base) MCG/ACT inhaler Inhale 1-2 puffs into the lungs every 6 (six) hours as needed for wheezing or shortness of breath. 90 g Marney Setting, NP     PDMP not reviewed this encounter.   Marney Setting, NP 10/29/19 [phone]

## 2019-10-30 ENCOUNTER — Telehealth: Payer: Self-pay | Admitting: Unknown Physician Specialty

## 2019-10-30 NOTE — Telephone Encounter (Signed)
Called to discuss with patient about Covid symptoms and the use of bamlanivimab, a monoclonal antibody infusion for those with mild to moderate Covid symptoms and at a high risk of hospitalization.  Pt is qualified for this infusion at the [facility] infusion center due to Hypertension and BMI>35   Message left to call back

## 2019-10-31 ENCOUNTER — Telehealth: Payer: Self-pay | Admitting: Emergency Medicine

## 2019-10-31 MED ORDER — PREDNISONE 10 MG (21) PO TBPK: ORAL_TABLET | Freq: Every day | ORAL | 0 refills | Status: DC

## 2019-10-31 MED ORDER — ALBUTEROL SULFATE HFA 108 (90 BASE) MCG/ACT IN AERS: 1.0000 | INHALATION_SPRAY | Freq: Four times a day (QID) | RESPIRATORY_TRACT | 2 refills | Status: DC | PRN

## 2019-10-31 NOTE — Telephone Encounter (Signed)
PT lost medications after picking them up from pharmacy. [place], [place] approves represcribing of meds.

## 2019-12-12 ENCOUNTER — Ambulatory Visit: Payer: Medicare HMO | Admitting: Critical Care Medicine

## 2019-12-12 ENCOUNTER — Other Ambulatory Visit: Payer: Self-pay

## 2019-12-12 VITALS — BP 119/82 | HR 59 | Temp 98.0°F | Resp 18 | Ht 76.0 in

## 2019-12-12 DIAGNOSIS — Z1152 Encounter for screening for COVID-19: Secondary | ICD-10-CM | POA: Diagnosis not present

## 2019-12-12 DIAGNOSIS — I10 Essential (primary) hypertension: Secondary | ICD-10-CM

## 2019-12-12 DIAGNOSIS — F332 Major depressive disorder, recurrent severe without psychotic features: Secondary | ICD-10-CM

## 2019-12-12 DIAGNOSIS — L84 Corns and callosities: Secondary | ICD-10-CM | POA: Insufficient documentation

## 2019-12-12 DIAGNOSIS — F1410 Cocaine abuse, uncomplicated: Secondary | ICD-10-CM

## 2019-12-12 DIAGNOSIS — J410 Simple chronic bronchitis: Secondary | ICD-10-CM

## 2019-12-12 LAB — POC SOFIA SARS ANTIGEN FIA: SARS:: NEGATIVE

## 2019-12-12 MED ORDER — ARIPIPRAZOLE 2 MG PO TABS: 2.0000 mg | ORAL_TABLET | Freq: Every day | ORAL | 0 refills | Status: DC

## 2019-12-12 NOTE — Assessment & Plan Note (Signed)
COPD and asthma stable at this time  Will continue [facility] and albuterol

## 2019-12-12 NOTE — Progress Notes (Signed)
Subjective:    Patient ID: Robert Chan, male    DOB: [birth year], 64 y.o.   MRN: [id number]  This is a 64 year old male resident of the shelter and [place] comes to the mobile clinic with concerns of potential Covid.  His symptoms began on 9 February.  He says increased loose stools, sneezing fatigue nasal congestion but no cough.  He has history of COPD asthma and does take short of breath and smokes 2 to 3 cigarettes daily  Has a history of cocaine use and did use cocaine a week ago he is also history of severe major depression with out psychotic features The patient's been to the shelter approximately 1 week and came from [place] came from [place]     The patient has been on Abilify in the past but ran out of this medication he does have the albuterol and Dulera inhalers.  Past Medical History:  Diagnosis Date  . Asthma   . Cataract   . COPD (chronic obstructive pulmonary disease) ([place])   . Hypertension      Family History  Problem Relation Age of Onset  . Hypertension Mother   . Diabetes Mother   . Hypertension Father   . Hypertension Sister      Social History   Socioeconomic History  . Marital status: Single    Spouse name: Not on file  . Number of children: Not on file  . Years of education: Not on file  . Highest education level: Not on file  Occupational History  . Not on file  Tobacco Use  . Smoking status: Current Some Day Smoker  . Smokeless tobacco: Never Used  Substance and Sexual Activity  . Alcohol use: Yes  . Drug use: No  . Sexual activity: Yes  Other Topics Concern  . Not on file  Social History Narrative  . Not on file   Social Determinants of Health   Financial Resource Strain:   . Difficulty of Paying Living Expenses: Not on file  Food Insecurity:   . Worried About Charity fundraiser in the Last Year: Not on file  . Ran Out of Food in the Last Year: Not on file  Transportation Needs:   . Lack of Transportation  (Medical): Not on file  . Lack of Transportation (Non-Medical): Not on file  Physical Activity:   . Days of Exercise per Week: Not on file  . Minutes of Exercise per Session: Not on file  Stress:   . Feeling of Stress : Not on file  Social Connections:   . Frequency of Communication with Friends and Family: Not on file  . Frequency of Social Gatherings with Friends and Family: Not on file  . Attends Religious Services: Not on file  . Active Member of Clubs or Organizations: Not on file  . Attends Archivist Meetings: Not on file  . Marital Status: Not on file  Intimate Partner Violence:   . Fear of Current or Ex-Partner: Not on file  . Emotionally Abused: Not on file  . Physically Abused: Not on file  . Sexually Abused: Not on file     No Known Allergies   Outpatient Medications Prior to Visit  Medication Sig Dispense Refill  . albuterol (VENTOLIN HFA) 108 (90 Base) MCG/ACT inhaler Inhale 1-2 puffs into the lungs every 6 (six) hours as needed for wheezing or shortness of breath. 90 g 2  . mometasone-formoterol (DULERA) 200-5 MCG/ACT AERO Inhale 2 puffs into the  lungs 2 (two) times daily. 8.8 g 0  . predniSONE (STERAPRED UNI-PAK 21 TAB) 10 MG (21) TBPK tablet Take by mouth daily. Take 6 tabs by mouth daily  for 2 days, then 5 tabs for 2 days, then 4 tabs for 2 days, then 3 tabs for 2 days, 2 tabs for 2 days, then 1 tab by mouth daily for 2 days 42 tablet 0  . ARIPiprazole (ABILIFY) 2 MG tablet Take 1 tablet (2 mg total) by mouth daily. (Patient not taking: Reported on 12/12/2019) 30 tablet 0  . lidocaine (LIDODERM) 5 % Place 1 patch onto the skin daily. Remove & Discard patch within 12 hours or as directed by MD (Patient not taking: Reported on 12/12/2019) 30 patch 0  . metoprolol tartrate (LOPRESSOR) 25 MG tablet Take 0.5 tablets (12.5 mg total) by mouth 2 (two) times daily. (Patient not taking: Reported on 12/12/2019) 30 tablet 0  . mirtazapine (REMERON) 30 MG tablet Take 1  tablet (30 mg total) by mouth at bedtime. (Patient not taking: Reported on 12/12/2019) 30 tablet 0  . Multiple Vitamin (MULTIVITAMIN WITH MINERALS) TABS tablet Take 1 tablet by mouth daily. (Patient not taking: Reported on 12/12/2019)     No facility-administered medications prior to visit.    Review of Systems Constitutional:   No  weight loss, night sweats,  Fevers, chills, fatigue, lassitude. HEENT:   No headaches,  Difficulty swallowing,  Tooth/dental problems,  Sore throat,                 sneezing, itching, ear ache, nasal congestion, post nasal drip,   CV:  No chest pain,  Orthopnea, PND, swelling in lower extremities, anasarca, dizziness, palpitations  GI   heartburn, indigestion, abdominal pain, nausea, vomiting, diarrhea, change in bowel habits, loss of appetite  Resp: No shortness of breath with exertion or at rest.  No excess mucus, no productive cough,  No non-productive cough,  No coughing up of blood.  No change in color of mucus.  No wheezing.  No chest wall deformity  Skin: no rash or lesions.  GU: no dysuria, change in color of urine, no urgency or frequency.  No flank pain.  MS:  No joint pain or swelling.  No decreased range of motion.  No back pain.  Psych:  No change in mood or affect. No depression or anxiety.  No memory loss.     Objective:   Physical Exam Vitals:   12/12/19 1101  BP: 119/82  Pulse: (!) 59  Resp: 18  Temp: 98 F (36.7 C)  SpO2: 97%  Height: 6\' 4"  (1.93 m)    Gen: Pleasant, well-nourished, in no distress,  normal affect  ENT: No lesions,  mouth clear,  oropharynx clear, no postnasal drip  Neck: No JVD, no TMG, no carotid bruits  Lungs: No use of accessory muscles, no dullness to percussion, clear without rales or rhonchi  Cardiovascular: RRR, heart sounds normal, no murmur or gallops, no peripheral edema  Abdomen: soft and NT, no HSM,  BS normal  Musculoskeletal: No deformities, no cyanosis or clubbing  Neuro: alert, non  focal  Skin: Warm, no lesions or rashes  On the left foot there is a severe callus forming at the base of the great toe  No results found.  Covid screening point-of-care negative      Assessment & Plan:  I personally reviewed all images and lab data in the [facility] system as well as any outside material available during this  office visit and agree with the  radiology impressions.   Benign essential hypertension Hypertension controlled off medications at this time will monitor  COPD (chronic obstructive pulmonary disease) (HCC) COPD and asthma stable at this time  Will continue Dulera and albuterol  Foot callus Foot callus present on the left foot will refer to podiatry  Severe recurrent major depression without psychotic features ([place]) Severe recurrent depression without psychosis prior use of cocaine  We will plan to refill Abilify 2 mg daily  Encounter for screening for COVID-19 Point-of-care Covid test was negative   Anand was seen today for covid exposure.  Diagnoses and all orders for this visit:  Benign essential hypertension  Encounter for screening for COVID-19  Severe recurrent major depression without psychotic features ([place])  Cocaine use disorder, mild, abuse ([place])  Foot callus -     Ambulatory referral to Podiatry  Simple chronic bronchitis ([place])  Other orders -     ARIPiprazole (ABILIFY) 2 MG tablet; Take 1 tablet (2 mg total) by mouth daily.

## 2019-12-12 NOTE — Patient Instructions (Signed)
Begin Abilify 1 daily  The appointment with the podiatrist will be made  Return to see Dr. Joya Gaskins [facility] to establish for primary care  Covid test was negative

## 2019-12-12 NOTE — Addendum Note (Signed)
Addended by: Trecia Rogers on: 12/12/2019 04:57 PM   Modules accepted: Orders

## 2019-12-12 NOTE — Assessment & Plan Note (Signed)
Point-of-care Covid test was negative

## 2019-12-12 NOTE — Assessment & Plan Note (Signed)
Hypertension controlled off medications at this time will monitor

## 2019-12-12 NOTE — Assessment & Plan Note (Addendum)
Severe recurrent depression without psychosis prior use of cocaine  We will plan to refill Abilify 2 mg daily

## 2019-12-12 NOTE — Assessment & Plan Note (Signed)
Foot callus present on the left foot will refer to podiatry

## 2019-12-17 ENCOUNTER — Ambulatory Visit: Payer: Self-pay | Admitting: Podiatry

## 2019-12-25 ENCOUNTER — Telehealth: Payer: Self-pay

## 2019-12-25 NOTE — Telephone Encounter (Signed)
Called to reschedule appt [facility] ,rescheduled for 01-02-2020 @ 10 am ,directions and appt given to client ,encouraged not to miss this appt time.

## 2019-12-25 NOTE — Congregational Nurse Program (Signed)
  Dept: [facility] Nurse Program Note  Date of Encounter: 12/25/2019  Past Medical History: Past Medical History:  Diagnosis Date  . Asthma   . Cataract   . COPD (chronic obstructive pulmonary disease) ([place])   . Hypertension     Encounter Details: CNP Questionnaire - 12/25/19 1518      Questionnaire   Patient Status  Not Applicable    Race  Black or African American    Location Patient Served At  [place]    Uninsured  Not Applicable    Food  No food insecurities    Housing/Utilities  No permanent housing    Transportation  No transportation needs    Interpersonal Safety  Yes, feel physically and emotionally safe where you currently live    Medication  No medication insecurities    Medical Provider  No    Referrals  Primary Care Provider/Clinic    ED Visit Averted  Yes    Life-Saving Intervention Made  Not Applicable      Initial visit to see the nurse at [facility] ,states he was seen a week ago by mobile van and not sure if appointments  For his follow up have been made and missed his appointment foot care .Observation of right foot very dry hard skin ,hard calluse area on right side of foot ,hurts made it hard to walk, hurts with shoe on.  Client states he isn't feeling well today ,usually very active will walk ,work out but not feeling well today ,tested for COVID 12-12-2019 was negative, no congestion today history of COPD /asthma ,no SOB ,congestion ,wheezing . Takes medications for COPD and albuterol as needed but doesn't feel like he needs that today ,not stressed he states, ,no vomiting ,nausea ,or chest pains or tightness. Requesting COVID 19 vaccine ,nurse will work on appointment.Counseled regarding respiratory difficulties and use of 911 if needed ,will refer to Dr Joya Gaskins if not feeling better tomorrow ,and called to schedule follow up appointment to get blood work ,closed today will call tomorrow. Called and  rescheduled foot care appointment for 3-3-2021at 10 am with [facility].  States is his the youngest of 89 children ,receives disability    Blood sugar test done 151 mg @3 :03 pm today , last meal was 12:30 pm he states . Will need A1-C and baseline blood work ,talked about diabetes states he has never been told he may have diabetes,coiunseled on signs and symptoms . Follow up tomorrow .

## 2019-12-26 ENCOUNTER — Telehealth: Payer: Self-pay

## 2019-12-26 NOTE — Telephone Encounter (Signed)
TC to [facility],client has been scheduled for follow up with Dr Joya Gaskins on 01-22-2020 @ 11 am . Note placed in clients mail box.

## 2019-12-26 NOTE — Congregational Nurse Program (Signed)
  Dept: [facility] Nurse Program Note  Date of Encounter: 12/26/2019  Past Medical History: Past Medical History:  Diagnosis Date  . Asthma   . Cataract   . COPD (chronic obstructive pulmonary disease) ([place])   . Hypertension     Encounter Details: CNP Questionnaire - 12/26/19 1300      Questionnaire   Patient Status  Not Applicable    Race  Black or African American    Location Patient Served At  [place]    Uninsured  Not Applicable    Food  No food insecurities    Housing/Utilities  No permanent housing    Transportation  Provided transportation assistance (bus pass, taxi voucher, etc.)    Interpersonal Safety  Yes, feel physically and emotionally safe where you currently live    Medication  No medication insecurities    Medical Provider  No    Referrals  Primary Care Provider/Clinic    ED Visit Averted  Not Applicable     Client in mens lounge today today states he feels so much better today has walked today ,worked out ,cant understand why he felt so bad on yesterday . History of alcoholism not use to not drinking ?? could be a factor he states I didn't think about that. Nurse will alert MD that he is better no need to visit him at mobile clinic today . Nurse checked on his visit  To see Dr Joya Gaskins for follow up scheduled 01-22-2020 2 11 am ,note placed in clients mailbox.   Follow weekly

## 2020-01-02 ENCOUNTER — Ambulatory Visit (INDEPENDENT_AMBULATORY_CARE_PROVIDER_SITE_OTHER): Payer: Medicare HMO

## 2020-01-02 ENCOUNTER — Other Ambulatory Visit: Payer: Self-pay | Admitting: Podiatry

## 2020-01-02 ENCOUNTER — Other Ambulatory Visit: Payer: Self-pay

## 2020-01-02 ENCOUNTER — Ambulatory Visit (INDEPENDENT_AMBULATORY_CARE_PROVIDER_SITE_OTHER): Payer: Medicare HMO | Admitting: Podiatry

## 2020-01-02 ENCOUNTER — Telehealth: Payer: Self-pay

## 2020-01-02 DIAGNOSIS — L989 Disorder of the skin and subcutaneous tissue, unspecified: Secondary | ICD-10-CM

## 2020-01-02 DIAGNOSIS — M7752 Other enthesopathy of left foot: Secondary | ICD-10-CM | POA: Diagnosis not present

## 2020-01-02 DIAGNOSIS — M79672 Pain in left foot: Secondary | ICD-10-CM

## 2020-01-02 NOTE — Congregational Nurse Program (Signed)
  Dept: [facility] Nurse Program Note  Date of Encounter: 01/01/2020  Past Medical History: Past Medical History:  Diagnosis Date  . Asthma   . Cataract   . COPD (chronic obstructive pulmonary disease) ([place])   . Hypertension     Encounter Details: CNP Questionnaire - 01/02/20 2250      Questionnaire   Patient Status  Not Applicable    Race  Black or African American    Location Patient Served At  Not Applicable    Insurance  Medicaid    Uninsured  Not Applicable    Food  No food insecurities    Housing/Utilities  No permanent housing    Transportation  Yes, need transportation assistance    Interpersonal Safety  Yes, feel physically and emotionally safe where you currently live    Medication  No medication insecurities    Medical Provider  Yes    Referrals  Primary Care Provider/Clinic;Area Agency    ED Visit Averted  Not Applicable    Life-Saving Intervention Made  Not Applicable      Client in for blood pressure check today . Allowed client to talk about his life ,experiences ,family and lessons learned . Today he was feeling a need to talk states he was somewhat depressed last evening got in late and just wasn't  a good day. Today he feels better and glad he didn't make a hasty decision last evening. . A smoker ,talked about smoking and relation to  COPD and asthma. He walks a lot works out most days..Visits daughter and grandchildren almost daily and helps them out. Wants and needs his own space. Reminded of foot care appointment for tomorrow at [facility]. To see PCP on 01-22-2020.  Will try to schedule him for his COVID vaccine was out on yesterday and phone was out unable to reach him. Follow weekly.

## 2020-01-02 NOTE — Telephone Encounter (Signed)
TC to case manager to check with client on his availability to come in for his COVID vaccine . Client out of site at this time. Will try later.

## 2020-01-05 NOTE — Progress Notes (Signed)
   Subjective: 64 y.o. male presenting to the office today as a new patient with a chief complaint of a painful callus lesion noted to the sub-fifth MPJ of the left foot that began about 6 months ago. Walking and bearing weight increases the pain. He has not had any treatment for his symptoms. Patient is here for further evaluation and treatment.   Past Medical History:  Diagnosis Date  . Asthma   . Cataract   . COPD (chronic obstructive pulmonary disease) ([place])   . Hypertension      Objective:  Physical Exam General: Alert and oriented x3 in no acute distress  Dermatology: Hyperkeratotic lesion(s) present on the sub-fifth MPJ of the left foot. Pain on palpation with a central nucleated core noted. Skin is warm, dry and supple bilateral lower extremities. Negative for open lesions or macerations.  Vascular: Palpable pedal pulses bilaterally. No edema or erythema noted. Capillary refill within normal limits.  Neurological: Epicritic and protective threshold grossly intact bilaterally.   Musculoskeletal Exam: Pain on palpation at the keratotic lesion(s) noted. Range of motion within normal limits bilateral. Muscle strength 5/5 in all groups bilateral.  Assessment: 1. Porokeratosis sub-fifth MPJ left foot 2. 5th MPJ capsulitis left    Plan of Care:  1. Patient evaluated 2. Excisional debridement of keratoic lesion(s) using a chisel blade was performed without incident.  3. Dressed area with light dressing. 4. Injection of 0.5 mLs Celestone Soluspan injected into the 5th MPJ of the left foot.  5. Recommended OTC corn and callus remover.  6. Patient is to return to the clinic PRN.   Edrick Kins, DPM [facility]  Dr. Edrick Kins, [address]                                        [place], [place] [address]                Office [phone]  Fax [phone]

## 2020-01-08 ENCOUNTER — Telehealth: Payer: Self-pay

## 2020-01-08 NOTE — Telephone Encounter (Signed)
Several attempts to get client scheduled for his COVID vaccine . Appointment made for 01-14-2020 @ 12:45 at [facility] left for client. Will follow up !

## 2020-01-08 NOTE — Congregational Nurse Program (Signed)
  Dept: [place] Nurse Program Note  Date of Encounter: 01/08/2020  Past Medical History: Past Medical History:  Diagnosis Date  . Asthma   . Cataract   . COPD (chronic obstructive pulmonary disease) ([place])   . Hypertension     Encounter Details: CNP Questionnaire - 01/08/20 1334      Questionnaire   Patient Status  Not Applicable    Race  Black or African American    Location Patient Served At  Not Applicable    Insurance  Medicaid    Uninsured  Not Applicable    Food  No food insecurities    Housing/Utilities  No permanent housing    Transportation  Provided transportation assistance (bus pass, taxi voucher, etc.)    Interpersonal Safety  Yes, feel physically and emotionally safe where you currently live    Medication  Provided medication assistance    Medical Provider  Yes    Referrals  Area Agency;Primary Care Provider/Clinic;[facility];Medication Assistance    ED Visit Averted  Not Applicable    Life-Saving Intervention Made  Not Applicable     Nurse followed up to see how visit to [facility] went last week . He states things went well ,feet were scraped and injections were done . Feet feel much better . [facility] ,nurse called no script there ,will follow up with [facility] . Discussed with client his COVID vaccine appointment ,he will keep appointment on Monday . Time and address reviewed with client.  Follow up next week

## 2020-01-09 ENCOUNTER — Telehealth: Payer: Self-pay | Admitting: Podiatry

## 2020-01-09 NOTE — Telephone Encounter (Signed)
Pt called to see if the doctor was still gonna call in a cream for his feet it was not at [facility] pharmacy. [facility] [address]

## 2020-01-09 NOTE — Telephone Encounter (Signed)
Left message informing pt Dr. Amalia Hailey had wanted him to use a over the counter corn and callous remover, Dr. Felicie Morn was a brand name but the store brand would be fine and apply the medication to the corn or callous allow to dry prior to putting on socks and only put on the corn or callous not on healthy skin.

## 2020-01-14 ENCOUNTER — Ambulatory Visit: Payer: Medicare HMO | Attending: Internal Medicine

## 2020-01-15 ENCOUNTER — Telehealth: Payer: Self-pay

## 2020-01-15 NOTE — Telephone Encounter (Signed)
Nurse  called to follow up on client concern regarding medication for his feet . He states he was told there would be some cream ordered and [facility] and  There was no order ,nurse also called and there was no new orderr for medications.Visit to foot clinic went well. [facility] was to check with the MD to see if he will call in cream and follow up with client.

## 2020-01-16 ENCOUNTER — Encounter (HOSPITAL_COMMUNITY): Payer: Self-pay

## 2020-01-16 ENCOUNTER — Emergency Department (HOSPITAL_COMMUNITY)
Admission: EM | Admit: 2020-01-16 | Discharge: 2020-01-16 | Disposition: A | Discharge status: Home / Self Care | Payer: Medicare HMO | Attending: Emergency Medicine | Admitting: Emergency Medicine

## 2020-01-16 ENCOUNTER — Emergency Department (HOSPITAL_COMMUNITY): Payer: Medicare HMO

## 2020-01-16 ENCOUNTER — Other Ambulatory Visit: Payer: Self-pay

## 2020-01-16 DIAGNOSIS — Z79899 Other long term (current) drug therapy: Secondary | ICD-10-CM | POA: Insufficient documentation

## 2020-01-16 DIAGNOSIS — F17210 Nicotine dependence, cigarettes, uncomplicated: Secondary | ICD-10-CM | POA: Insufficient documentation

## 2020-01-16 DIAGNOSIS — J449 Chronic obstructive pulmonary disease, unspecified: Secondary | ICD-10-CM | POA: Diagnosis not present

## 2020-01-16 DIAGNOSIS — M79605 Pain in left leg: Secondary | ICD-10-CM | POA: Diagnosis present

## 2020-01-16 MED ORDER — METHOCARBAMOL 500 MG PO TABS: 500.0000 mg | ORAL_TABLET | Freq: Three times a day (TID) | ORAL | 0 refills | Status: DC | PRN

## 2020-01-16 MED ORDER — PREDNISONE 20 MG PO TABS: 40.0000 mg | ORAL_TABLET | Freq: Every day | ORAL | 0 refills | Status: DC

## 2020-01-16 NOTE — Discharge Instructions (Signed)
Take the steroids and the muscle relaxers to help with the pain

## 2020-01-16 NOTE — ED Provider Notes (Signed)
[facility] EMERGENCY DEPARTMENT Provider Note   CSN: [id number] Arrival date & time: 01/16/20  [id number]     History No chief complaint on file.   Robert Chan is a 64 y.o. male.  HPI Patient presents with pain going from left hip down the back of the left leg.  Has had for around the last week.  No injury.  Has not really taken anything for it.  No fevers.  No trauma.  No numbness or weakness.  States the leg will give out a little bit but thinks it is secondary to the pain.  No abdominal pain.    Past Medical History:  Diagnosis Date  . Asthma   . Cataract   . COPD (chronic obstructive pulmonary disease) ([place])   . Hypertension     Patient Active Problem List   Diagnosis Date Noted  . Foot callus 12/12/2019  . Encounter for screening for COVID-19 12/12/2019  . Severe recurrent major depression without psychotic features ([place]) 09/24/2019  . Cocaine use disorder, mild, abuse ([place])   . Elevated serum creatinine 08/28/2019  . Moderate alcohol use disorder ([place]) 04/01/2018  . Age-related nuclear cataract of right eye 09/13/2016  . Benign essential hypertension 07/06/2016  . Glaucoma 07/06/2016  . Male erectile disorder 07/06/2016  . Asthma 10/18/2012  . COPD (chronic obstructive pulmonary disease) ([place]) 10/18/2012    Past Surgical History:  Procedure Laterality Date  . APPENDECTOMY    . EYE SURGERY    . PROSTATE SURGERY         Family History  Problem Relation Age of Onset  . Hypertension Mother   . Diabetes Mother   . Hypertension Father   . Hypertension Sister     Social History   Tobacco Use  . Smoking status: Current Some Day Smoker  . Smokeless tobacco: Never Used  Substance Use Topics  . Alcohol use: Yes  . Drug use: No    Home Medications Prior to Admission medications   Medication Sig Start Date End Date Taking? Authorizing Provider  albuterol (VENTOLIN HFA) 108 (90 Base) MCG/ACT inhaler Inhale 1-2 puffs into the lungs every  6 (six) hours as needed for wheezing or shortness of breath. 10/31/19   Jaynee Eagles, PA-C  ARIPiprazole (ABILIFY) 2 MG tablet Take 1 tablet (2 mg total) by mouth daily. 12/12/19   Elsie Stain, MD  methocarbamol (ROBAXIN) 500 MG tablet Take 1 tablet (500 mg total) by mouth every 8 (eight) hours as needed for muscle spasms. 01/16/20   Davonna Belling, MD  mometasone-formoterol [facility]) 200-5 MCG/ACT AERO Inhale 2 puffs into the lungs 2 (two) times daily. 10/01/19   Connye Burkitt, NP  predniSONE (DELTASONE) 20 MG tablet Take 2 tablets (40 mg total) by mouth daily. 01/16/20   Davonna Belling, MD    Allergies    Patient has no known allergies.  Review of Systems   Review of Systems  Constitutional: Negative for appetite change.  Respiratory: Negative for shortness of breath.   Gastrointestinal: Negative for abdominal pain.  Musculoskeletal: Negative for back pain.       Left hip pain going down back of leg.  Skin: Negative for wound.  Neurological: Negative for weakness and numbness.    Physical Exam Updated Vital Signs BP 114/83   Pulse 71   Temp 98.2 F (36.8 C) (Oral)   Resp 16   Ht 6\' 4"  (1.93 m)   Wt 106.6 kg   SpO2 98%   BMI 28.61  kg/m   Physical Exam Vitals and nursing note reviewed.  Abdominal:     Tenderness: There is no abdominal tenderness.  Musculoskeletal:     Left lower leg: No edema.     Comments: Some pain with straight leg raise on left.  Good rotation at hip.  No lateral hip tenderness.  No lumbar tenderness.  Does have some tenderness down the hamstrings on the left.  Neurovascular intact in foot.  No knee tenderness.  Good range of motion in knee.  Skin:    General: Skin is warm.     Capillary Refill: Capillary refill takes less than 2 seconds.  Neurological:     Mental Status: He is alert and oriented to person, place, and time.     ED Results / Procedures / Treatments   Labs (all labs ordered are listed, but only abnormal results are  displayed) Labs Reviewed - No data to display  EKG None  Radiology DG Hip Unilat With Pelvis 2-3 Views Left  Result Date: 01/16/2020 CLINICAL DATA:  Pain for 1 week. Denies injury. EXAM: DG HIP (WITH OR WITHOUT PELVIS) 2-3V LEFT COMPARISON:  None. FINDINGS: There is no evidence of hip fracture or dislocation. Mild joint space narrowing and subchondral sclerosis noted. IMPRESSION: Mild osteoarthritis. Electronically Signed   By: Kerby Moors M.D.   On: 01/16/2020 10:12    Procedures Procedures (including critical care time)  Medications Ordered in ED Medications - No data to display  ED Course  I have reviewed the triage vital signs and the nursing notes.  Pertinent labs & imaging results that were available during my care of the patient were reviewed by me and considered in my medical decision making (see chart for details).    MDM Rules/Calculators/A&P                     Patient with pain in left hip going down hamstring.  Likely either primary hip cause versus potential hamstring strain.  Sciatica also considered.  Will give muscle relaxer and short course of steroids.  outpatient follow-up with PCP as needed Final Clinical Impression(s) / ED Diagnoses Final diagnoses:  Pain of left lower extremity    Rx / DC Orders ED Discharge Orders         Ordered    methocarbamol (ROBAXIN) 500 MG tablet  Every 8 hours PRN     01/16/20 1041    predniSONE (DELTASONE) 20 MG tablet  Daily     01/16/20 1041           Davonna Belling, MD 01/16/20 1054

## 2020-01-16 NOTE — ED Triage Notes (Signed)
Patient complains of 1 day of left hip pain x 1 week with ROM. Denies injury, has not taken any otc meds. Patient no hx of same

## 2020-01-20 NOTE — Progress Notes (Deleted)
Subjective:    Patient ID: Robert Chan, male    DOB: [birth year], 64 y.o.   MRN: [name]  This is a 64 year old male resident of the shelter and Robert Chan comes to the mobile clinic with concerns of potential Covid.  His symptoms began on 9 February.  He says increased loose stools, sneezing fatigue nasal congestion but no cough.  He has history of COPD asthma and does take short of breath and smokes 2 to 3 cigarettes daily  Has a history of cocaine use and did use cocaine a week ago he is also history of severe major depression with out psychotic features The patient's been to the shelter approximately 1 week and came from Robert Chan came from Robert Chan     The patient has been on Abilify in the past but ran out of this medication he does have the albuterol and Dulera inhalers.  3/23: diology impressions.   Benign essential hypertension Hypertension controlled off medications at this time will monitor  COPD (chronic obstructive pulmonary disease) (HCC) COPD and asthma stable at this time  Will continue Dulera and albuterol  Foot callus Foot callus present on the left foot will refer to podiatry  Severe recurrent major depression without psychotic features (Robert Chan) Severe recurrent depression without psychosis prior use of cocaine  We will plan to refill Abilify 2 mg daily  Encounter for screening for COVID-19 Point-of-care Covid test was negative   Robert Chan was seen today for covid exposure.  Diagnoses and all orders for this visit:  Benign essential hypertension  Encounter for screening for COVID-19  Severe recurrent major depression without psychotic features (Robert Chan)  Cocaine use disorder, mild, abuse (Robert Chan)  Foot callus -     Ambulatory referral to Podiatry  Simple chronic bronchitis (Robert Chan)  Other orders -     ARIPiprazole (ABILIFY) 2 MG tablet; Take 1 tablet (2 mg total) by mouth daily.   Tdap and Pneumovax 23    Past Medical History:   Diagnosis Date  . Asthma   . Cataract   . COPD (chronic obstructive pulmonary disease) ([name])   . Hypertension      Family History  Problem Relation Age of Onset  . Hypertension Mother   . Diabetes Mother   . Hypertension Father   . Hypertension Sister      Social History   Socioeconomic History  . Marital status: Single    Spouse name: Not on file  . Number of children: Not on file  . Years of education: Not on file  . Highest education level: Not on file  Occupational History  . Not on file  Tobacco Use  . Smoking status: Current Some Day Smoker  . Smokeless tobacco: Never Used  Substance and Sexual Activity  . Alcohol use: Yes  . Drug use: No  . Sexual activity: Yes  Other Topics Concern  . Not on file  Social History Narrative  . Not on file   Social Determinants of Health   Financial Resource Strain:   . Difficulty of Paying Living Expenses:   Food Insecurity:   . Worried About Charity fundraiser in the Last Year:   . Arboriculturist in the Last Year:   Transportation Needs:   . Film/video editor (Medical):   Marland Kitchen Lack of Transportation (Non-Medical):   Physical Activity:   . Days of Exercise per Week:   . Minutes of Exercise per Session:   Stress:   . Feeling of Stress :  Social Connections:   . Frequency of Communication with Friends and Family:   . Frequency of Social Gatherings with Friends and Family:   . Attends Religious Services:   . Active Member of Clubs or Organizations:   . Attends Archivist Meetings:   Marland Kitchen Marital Status:   Intimate Partner Violence:   . Fear of Current or Ex-Partner:   . Emotionally Abused:   Marland Kitchen Physically Abused:   . Sexually Abused:      No Known Allergies   Outpatient Medications Prior to Visit  Medication Sig Dispense Refill  . albuterol (VENTOLIN HFA) 108 (90 Base) MCG/ACT inhaler Inhale 1-2 puffs into the lungs every 6 (six) hours as needed for wheezing or shortness of breath. 90 g 2  .  ARIPiprazole (ABILIFY) 2 MG tablet Take 1 tablet (2 mg total) by mouth daily. 30 tablet 0  . methocarbamol (ROBAXIN) 500 MG tablet Take 1 tablet (500 mg total) by mouth every 8 (eight) hours as needed for muscle spasms. 8 tablet 0  . mometasone-formoterol (DULERA) 200-5 MCG/ACT AERO Inhale 2 puffs into the lungs 2 (two) times daily. 8.8 g 0  . predniSONE (DELTASONE) 20 MG tablet Take 2 tablets (40 mg total) by mouth daily. 6 tablet 0   No facility-administered medications prior to visit.    Review of Systems Constitutional:   No  weight loss, night sweats,  Fevers, chills, fatigue, lassitude. HEENT:   No headaches,  Difficulty swallowing,  Tooth/dental problems,  Sore throat,                 sneezing, itching, ear ache, nasal congestion, post nasal drip,   CV:  No chest pain,  Orthopnea, PND, swelling in lower extremities, anasarca, dizziness, palpitations  GI   heartburn, indigestion, abdominal pain, nausea, vomiting, diarrhea, change in bowel habits, loss of appetite  Resp: No shortness of breath with exertion or at rest.  No excess mucus, no productive cough,  No non-productive cough,  No coughing up of blood.  No change in color of mucus.  No wheezing.  No chest wall deformity  Skin: no rash or lesions.  GU: no dysuria, change in color of urine, no urgency or frequency.  No flank pain.  MS:  No joint pain or swelling.  No decreased range of motion.  No back pain.  Psych:  No change in mood or affect. No depression or anxiety.  No memory loss.     Objective:   Physical Exam There were no vitals filed for this visit.  Gen: Pleasant, well-nourished, in no distress,  normal affect  ENT: No lesions,  mouth clear,  oropharynx clear, no postnasal drip  Neck: No JVD, no TMG, no carotid bruits  Lungs: No use of accessory muscles, no dullness to percussion, clear without rales or rhonchi  Cardiovascular: RRR, heart sounds normal, no murmur or gallops, no peripheral edema  Abdomen:  soft and NT, no HSM,  BS normal  Musculoskeletal: No deformities, no cyanosis or clubbing  Neuro: alert, non focal  Skin: Warm, no lesions or rashes  On the left foot there is a severe callus forming at the base of the great toe  No results found.  Covid screening point-of-care negative      Assessment & Plan:  I personally reviewed all images and lab data in the [facility] system as well as any outside material available during this office visit and agree with the  radiology impressions.   No problem-specific Assessment &  Plan notes found for this encounter.   There are no diagnoses linked to this encounter.

## 2020-01-22 ENCOUNTER — Ambulatory Visit (HOSPITAL_BASED_OUTPATIENT_CLINIC_OR_DEPARTMENT_OTHER): Payer: Medicaid Other | Admitting: Critical Care Medicine

## 2020-01-22 DIAGNOSIS — Z1159 Encounter for screening for other viral diseases: Secondary | ICD-10-CM

## 2020-01-22 DIAGNOSIS — Z114 Encounter for screening for human immunodeficiency virus [HIV]: Secondary | ICD-10-CM

## 2020-01-22 DIAGNOSIS — Z1211 Encounter for screening for malignant neoplasm of colon: Secondary | ICD-10-CM

## 2020-02-13 ENCOUNTER — Telehealth: Payer: Self-pay

## 2020-02-13 NOTE — Telephone Encounter (Signed)
Client doesn't have a return appointment call as needed ,will let client know and give him the number and address for later reference.

## 2020-02-13 NOTE — Congregational Nurse Program (Signed)
  Dept: [place] Nurse Program Note  Date of Encounter: 02/12/2020  Past Medical History: Past Medical History:  Diagnosis Date  . Asthma   . Cataract   . COPD (chronic obstructive pulmonary disease) ([place])   . Hypertension     Encounter Details: CNP Questionnaire - 02/13/20 1720      Questionnaire   Patient Status  Not Applicable    Race  Black or African American    Location Patient Served At  Not Applicable    Insurance  Medicaid    Uninsured  Not Applicable    Food  No food insecurities    Housing/Utilities  No permanent housing    Transportation  No transportation needs    Interpersonal Safety  Yes, feel physically and emotionally safe where you currently live    Medication  No medication insecurities    Medical Provider  No    Referrals  Primary Care Provider/Clinic    ED Visit Averted  Not Applicable    Life-Saving Intervention Made  Not Applicable     Nurse met client in the hallway and ask how he was doing ,states feeling a little sluggish,low energy. No asthma attacks ,pollen not bothering him ,just slow today ,states he has not been able to eat a lot and feels he has lost weight,states he was up to 260 now 240 lbs? Eats smaller meals ,counseled tried to pin point other causes ,symptoms not sure what is occurring ,he states he called and made his appointment to establish his PCP with Dr. Joya Gaskins and his appt is May as he didn't keep previous appointment . Daughter and grandchildren doing well ,looking forward to getting his own place soon. .Feet  Doing well not sure if he needs to return.Nurse will check on that for him and let  him know. Didn't go to the gym today plans to go tomorrow . Will return to see nurse tomorrow if he still doesn't feel well  . Follow

## 2020-02-27 NOTE — Congregational Nurse Program (Signed)
  Dept: [place] Nurse Program Note  Date of Encounter: 02/20/2020  Past Medical History: Past Medical History:  Diagnosis Date  . Asthma   . Cataract   . COPD (chronic obstructive pulmonary disease) ([place])   . Hypertension     Encounter Details: CNP Questionnaire - 02/20/20 1450      Questionnaire   Patient Status  Not Applicable    Race  Black or African American    Location Patient Served At  Not [place]    Uninsured  Not Applicable    Food  No food insecurities    Housing/Utilities  No permanent housing    Transportation  No transportation needs    Interpersonal Safety  Yes, feel physically and emotionally safe where you currently live    Medication  No medication insecurities    Medical Provider  No    Referrals  Primary Care Provider/Clinic    ED Visit Averted  Not Applicable    Life-Saving Intervention Made  Not Applicable     client stopped by nursing office states he is feeling okay  But feels he canr eat as much as usual ,no other symptoms ,missed his appointment to establish care not sure of next appointment time. Nurse counseled on the importance of establishing care and getting some baseline labs . No other symptoms and was a little vague about what was going on . Nurse called to get his PCP appointment and it  Scheduled for 03-18-2020 @ 8:50 am . Also ask him to stop by mobile clinic the next day if he didn't feel any better. Follow up next week

## 2020-02-27 NOTE — Congregational Nurse Program (Signed)
  Dept: [place] Nurse Program Note  Date of Encounter: 02/26/2020  Past Medical History: Past Medical History:  Diagnosis Date  . Asthma   . Cataract   . COPD (chronic obstructive pulmonary disease) ([place])   . Hypertension     Encounter Details: CNP Questionnaire - 02/26/20 1452      Questionnaire   Patient Status  Not Applicable    Race  Black or African American    Location Patient Served At  [place]    Uninsured  Not Applicable    Food  No food insecurities    Housing/Utilities  No permanent housing    Transportation  No transportation needs    Interpersonal Safety  Yes, feel physically and emotionally safe where you currently live    Medication  No medication insecurities    Medical Provider  No    Referrals  Primary Care Provider/Clinic    ED Visit Averted  Not Applicable    Life-Saving Intervention Made  Not Applicable     Client in today stating he is still feeling fatigued like he felt when he had COVID -83 ,doesn't feel like he has COVID ! Nurse encouraged him see mobile clnic on tomorrow ,no symptoms of COVID some cough maybe related to allergies ,and he has asthma . Talke dwith him about depression ,he denies that at this time . Fatigue may be related to late onset past COVID as he had COVID in December.  Will encourage follow up with mobile clinic until he can be seen by PCP>  Weighed himself today 250 lbs.

## 2020-02-28 ENCOUNTER — Ambulatory Visit: Payer: Medicaid Other | Admitting: Physician Assistant

## 2020-02-28 VITALS — BP 122/80 | HR 76 | Temp 97.7°F | Resp 20

## 2020-02-28 DIAGNOSIS — J4520 Mild intermittent asthma, uncomplicated: Secondary | ICD-10-CM

## 2020-02-28 DIAGNOSIS — Z5900 Homelessness unspecified: Secondary | ICD-10-CM

## 2020-02-28 DIAGNOSIS — F1414 Cocaine abuse with cocaine-induced mood disorder: Secondary | ICD-10-CM

## 2020-02-28 DIAGNOSIS — F1410 Cocaine abuse, uncomplicated: Secondary | ICD-10-CM

## 2020-02-28 DIAGNOSIS — F332 Major depressive disorder, recurrent severe without psychotic features: Secondary | ICD-10-CM

## 2020-02-28 DIAGNOSIS — D649 Anemia, unspecified: Secondary | ICD-10-CM

## 2020-02-28 DIAGNOSIS — R5383 Other fatigue: Secondary | ICD-10-CM

## 2020-02-28 DIAGNOSIS — E559 Vitamin D deficiency, unspecified: Secondary | ICD-10-CM

## 2020-02-28 MED ORDER — ARIPIPRAZOLE 2 MG PO TABS: 2.0000 mg | ORAL_TABLET | Freq: Every day | ORAL | 0 refills | Status: DC

## 2020-02-28 MED ORDER — MIRTAZAPINE 15 MG PO TABS: 15.0000 mg | ORAL_TABLET | Freq: Every day | ORAL | 2 refills | Status: DC

## 2020-02-28 NOTE — Progress Notes (Signed)
Fatigue, no energy x 1 week  Runny nose, cough, scratchy throat Vaccinated with [place] Covid 19 vaccine in March  Likes to walk and go the gym normally but has not felt up to it.

## 2020-02-28 NOTE — Progress Notes (Signed)
Acute Office Visit  Subjective:    Patient ID: Robert Chan, male    DOB: 12-Feb-1956, 64 y.o.   MRN: [name]  Chief Complaint  Patient presents with  . Fatigue  . Bronchitis    HPI Patient reports that he has been having excessive fatigue in the past week.  Reports that he has been drinking Ensure to try and improve his energy levels without relief.  Denies caffeine intake, states sleep is good, sleeping 8 to 9 hours, feels that it is restful.  Reports that he had COVID-19 in December 2020, did have feelings of fatigue during that time but they resolved.  Reports that he had the [employer] vaccination in the middle of March without any adverse effects afterwards.  States that he has been having sneezing and a  productive cough, will use his inhaler with relief. Denies sick contacts, any other URI sxs.     Office Visit from 02/28/2020 in [facility]  PHQ-9 Total Score  15       Past Medical History:  Diagnosis Date  . Asthma   . Cataract   . COPD (chronic obstructive pulmonary disease) ([place])   . Hypertension     Past Surgical History:  Procedure Laterality Date  . APPENDECTOMY    . EYE SURGERY    . PROSTATE SURGERY      Family History  Problem Relation Age of Onset  . Hypertension Mother   . Diabetes Mother   . Hypertension Father   . Hypertension Sister     Social History   Socioeconomic History  . Marital status: Single    Spouse name: Not on file  . Number of children: Not on file  . Years of education: Not on file  . Highest education level: Not on file  Occupational History  . Not on file  Tobacco Use  . Smoking status: Current Some Day Smoker  . Smokeless tobacco: Never Used  Substance and Sexual Activity  . Alcohol use: Yes  . Drug use: No  . Sexual activity: Yes  Other Topics Concern  . Not on file  Social History Narrative  . Not on file   Social Determinants of Health   Financial Resource Strain:   . Difficulty  of Paying Living Expenses:   Food Insecurity:   . Worried About Charity fundraiser in the Last Year:   . Arboriculturist in the Last Year:   Transportation Needs:   . Film/video editor (Medical):   Marland Kitchen Lack of Transportation (Non-Medical):   Physical Activity:   . Days of Exercise per Week:   . Minutes of Exercise per Session:   Stress:   . Feeling of Stress :   Social Connections:   . Frequency of Communication with Friends and Family:   . Frequency of Social Gatherings with Friends and Family:   . Attends Religious Services:   . Active Member of Clubs or Organizations:   . Attends Archivist Meetings:   Marland Kitchen Marital Status:   Intimate Partner Violence:   . Fear of Current or Ex-Partner:   . Emotionally Abused:   Marland Kitchen Physically Abused:   . Sexually Abused:     Outpatient Medications Prior to Visit  Medication Sig Dispense Refill  . albuterol (VENTOLIN HFA) 108 (90 Base) MCG/ACT inhaler Inhale 1-2 puffs into the lungs every 6 (six) hours as needed for wheezing or shortness of breath. 90 g 2  . SYMBICORT  160-4.5 MCG/ACT inhaler     . ARIPiprazole (ABILIFY) 2 MG tablet Take 1 tablet (2 mg total) by mouth daily. (Patient not taking: Reported on 02/28/2020) 30 tablet 0  . methocarbamol (ROBAXIN) 500 MG tablet Take 1 tablet (500 mg total) by mouth every 8 (eight) hours as needed for muscle spasms. (Patient not taking: Reported on 02/28/2020) 8 tablet 0  . mometasone-formoterol (DULERA) 200-5 MCG/ACT AERO Inhale 2 puffs into the lungs 2 (two) times daily. (Patient not taking: Reported on 02/28/2020) 8.8 g 0  . predniSONE (DELTASONE) 20 MG tablet Take 2 tablets (40 mg total) by mouth daily. (Patient not taking: Reported on 02/28/2020) 6 tablet 0   No facility-administered medications prior to visit.    No Known Allergies  Review of Systems  Constitutional: Positive for activity change, appetite change and fatigue. Negative for chills, fever and unexpected weight change.  HENT:  Positive for congestion and sneezing. Negative for sinus pain and sore throat.   Eyes: Negative.   Respiratory: Positive for cough. Negative for shortness of breath and wheezing.   Cardiovascular: Negative.   Gastrointestinal: Negative.   Endocrine: Negative.   Genitourinary: Negative.   Musculoskeletal: Negative.   Skin: Negative.   Allergic/Immunologic: Positive for environmental allergies.  Neurological: Negative.   Hematological: Negative.   Psychiatric/Behavioral: Positive for dysphoric mood. Negative for self-injury, sleep disturbance and suicidal ideas. The patient is nervous/anxious.        Objective:    Physical Exam Vitals and nursing note reviewed.  Constitutional:      General: He is not in acute distress.    Appearance: Normal appearance. He is obese. He is ill-appearing.  HENT:     Head: Normocephalic and atraumatic.     Right Ear: External ear normal.     Left Ear: External ear normal.     Mouth/Throat:     Mouth: Mucous membranes are moist.     Pharynx: Oropharynx is clear.  Eyes:     Extraocular Movements: Extraocular movements intact.     Conjunctiva/sclera: Conjunctivae normal.     Pupils: Pupils are equal, round, and reactive to light.  Cardiovascular:     Rate and Rhythm: Normal rate and regular rhythm.     Pulses: Normal pulses.     Heart sounds: Normal heart sounds.  Pulmonary:     Effort: Pulmonary effort is normal.     Breath sounds: Normal breath sounds.  Abdominal:     General: Abdomen is flat.     Palpations: Abdomen is soft.  Musculoskeletal:        General: Normal range of motion.     Cervical back: Normal range of motion and neck supple.  Skin:    General: Skin is warm and dry.  Neurological:     General: No focal deficit present.     Mental Status: He is alert and oriented to person, place, and time.  Psychiatric:        Attention and Perception: Attention normal. He does not perceive auditory or visual hallucinations.        Mood  and Affect: Mood is depressed.        Speech: Speech normal.        Behavior: Behavior normal.        Thought Content: Thought content does not include suicidal ideation. Thought content does not include homicidal or suicidal plan.        Cognition and Memory: Cognition normal.        Judgment: Judgment  normal.     BP 122/80   Pulse 76   Temp 97.7 F (36.5 C)   Resp 20   SpO2 97%  Wt Readings from Last 3 Encounters:  02/26/20 250 lb (113.4 kg)  02/12/20 240 lb (108.9 kg)  01/16/20 235 lb (106.6 kg)    Health Maintenance Due  Topic Date Due  . HIV Screening  Never done  . COVID-19 Vaccine (1) Never done  . TETANUS/TDAP  Never done  . COLON CANCER SCREENING ANNUAL FOBT  Never done  . PNA vac Low Risk Adult (1 of 2 - PCV13) Never done    There are no preventive care reminders to display for this patient.   Lab Results  Component Value Date   TSH 2.103 09/25/2019   Lab Results  Component Value Date   WBC 8.8 09/23/2019   HGB 13.1 09/23/2019   HCT 39.7 09/23/2019   MCV 94.1 09/23/2019   PLT 263 09/23/2019   Lab Results  Component Value Date   NA 140 09/23/2019   K 4.1 09/23/2019   CO2 24 09/23/2019   GLUCOSE 123 (H) 09/23/2019   BUN 14 09/23/2019   CREATININE 1.25 (H) 09/23/2019   CALCIUM 9.2 09/23/2019   ANIONGAP 8 09/23/2019   Lab Results  Component Value Date   CHOL 187 09/25/2019   Lab Results  Component Value Date   HDL 59 09/25/2019   Lab Results  Component Value Date   LDLCALC 114 (H) 09/25/2019   Lab Results  Component Value Date   TRIG 69 09/25/2019   Lab Results  Component Value Date   CHOLHDL 3.2 09/25/2019   Lab Results  Component Value Date   HGBA1C 5.1 09/25/2019       Assessment & Plan:   Problem List Items Addressed This Visit      Other   Severe recurrent major depression without psychotic features ([place]) - Primary   Relevant Medications   ARIPiprazole (ABILIFY) 2 MG tablet   mirtazapine (REMERON) 15 MG tablet    Cocaine use disorder, mild, abuse ([place])    Other Visit Diagnoses    Fatigue, unspecified type       Relevant Orders   Vitamin D, 25-hydroxy   CBC with Differential/Platelet   Comp. Metabolic Panel (12)   TSH   Homelessness       Cocaine abuse with cocaine-induced mood disorder ([facility])   (Chronic)         Meds ordered this encounter  Medications  . ARIPiprazole (ABILIFY) 2 MG tablet    Sig: Take 1 tablet (2 mg total) by mouth daily.    Dispense:  30 tablet    Refill:  0    Order Specific Question:   Supervising Provider    Answer:   Joya Gaskins, PATRICK E [[id number]]  . mirtazapine (REMERON) 15 MG tablet    Sig: Take 1 tablet (15 mg total) by mouth at bedtime.    Dispense:  30 tablet    Refill:  2    Order Specific Question:   Supervising Provider    Answer:   WRIGHT, PATRICK E [[id number]]  1. Severe recurrent major depression without psychotic features [facility]) Patient had PHQ-9 score of 15, has history of noncompliance to psychiatric medications.  Gave patient education on importance of compliance and benefits of medications.  He adamantly denies any thoughts of self-harm, has previously had suicide attempt by gunshot and was hospitalized for suicidal ideation in December 2020.  Patient agrees to return to  mobile unit in 1 week to discuss medication compliance and effectiveness. - ARIPiprazole (ABILIFY) 2 MG tablet; Take 1 tablet (2 mg total) by mouth daily.  Dispense: 30 tablet; Refill: 0 - mirtazapine (REMERON) 15 MG tablet; Take 1 tablet (15 mg total) by mouth at bedtime.  Dispense: 30 tablet; Refill: 2  2. Fatigue, unspecified type Rule out anemia, metabolic disorder, thyroid disorder - Vitamin D, 25-hydroxy - CBC with Differential/Platelet - Comp. Metabolic Panel (12) - TSH  3. Cocaine use disorder, mild, abuse ([place]) Patient endorses last use in March 2021  4. Homelessness Patient is staying at [facility], is working with Scientist, research (physical sciences) and services to help  improve his living situation  Continue current asthma regimen, avoid allergens   I have reviewed the patient's medical history (PMH, PSH, Social History, Family History, Medications, and allergies) , and have been updated if relevant. I spent 30 minutes reviewing chart and  face to face time with patient.      Loraine Grip Mayers, PA-C

## 2020-02-28 NOTE — Patient Instructions (Signed)
Resume Abilify and Remeron.  You can take both of these before bed.  They may make you sleepy.  You will come see me at the mobile clinic in one week so we can see how your medication is working.  It is important to take them every day.  We are also checking for anemia and thyroid disorder as well as your vit d levels.  Kennieth Rad, PA-C Physician Assistant [facility] [email or web address]    Major Depressive Disorder, Adult Major depressive disorder (MDD) is a mental health condition. MDD often makes you feel sad, hopeless, or helpless. MDD can also cause symptoms in your body. MDD can affect your:  Work.  School.  Relationships.  Other normal activities. MDD can range from mild to very bad. It may occur once (single episode MDD). It can also occur many times (recurrent MDD). The main symptoms of MDD often include:  Feeling sad, depressed, or irritable most of the time.  Loss of interest. MDD symptoms also include:  Sleeping too much or too little.  Eating too much or too little.  A change in your weight.  Feeling tired (fatigue) or having low energy.  Feeling worthless.  Feeling guilty.  Trouble making decisions.  Trouble thinking clearly.  Thoughts of suicide or harming others.  Feeling weak.  Feeling agitated.  Keeping yourself from being around other people (isolation). Follow these instructions at home: Activity  Do these things as told by your doctor: ? Go back to your normal activities. ? Exercise regularly. ? Spend time outdoors. Alcohol  Talk with your doctor about how alcohol can affect your antidepressant medicines.  Do not drink alcohol. Or, limit how much alcohol you drink. ? This means no more than 1 drink a day for nonpregnant women and 2 drinks a day for men. One drink equals one of these:  12 oz of beer.  5 oz of wine.  1 oz of hard liquor. General instructions  Take  over-the-counter and prescription medicines only as told by your doctor.  Eat a healthy diet.  Get plenty of sleep.  Find activities that you enjoy. Make time to do them.  Think about joining a support group. Your doctor may be able to suggest a group for you.  Keep all follow-up visits as told by your doctor. This is important. Where to find more information:  [employer] on Mental Illness: ? www.nami.[place]: ? https://carter.com/  National Suicide Prevention Lifeline: ? (450)141-4411. This is free, 24-hour help. Contact a doctor if:  Your symptoms get worse.  You have new symptoms. Get help right away if:  You self-harm.  You see, hear, taste, smell, or feel things that are not present (hallucinate). If you ever feel like you may hurt yourself or others, or have thoughts about taking your own life, get help right away. You can go to your nearest emergency department or call:  Your local emergency services (911 in the [place]  A suicide crisis helpline, such as the National Suicide Prevention Lifeline: ? 334 595 5020. This is open 24 hours a day. This information is not intended to replace advice given to you by your health care provider. Make sure you discuss any questions you have with your health care provider. Document Revised: 09/30/2017 Document Reviewed: 07/04/2016 Elsevier Patient Education  2020 [employer].

## 2020-03-01 LAB — CBC WITH DIFFERENTIAL/PLATELET
Basophils Absolute: 0.1 10*3/uL (ref 0.0–0.2)
Basos: 1 %
EOS (ABSOLUTE): 0.3 10*3/uL (ref 0.0–0.4)
Eos: 4 %
Hematocrit: 36.6 % — ABNORMAL LOW (ref 37.5–51.0)
Hemoglobin: 12.5 g/dL — ABNORMAL LOW (ref 13.0–17.7)
Immature Grans (Abs): 0 10*3/uL (ref 0.0–0.1)
Immature Granulocytes: 0 %
Lymphocytes Absolute: 2.2 10*3/uL (ref 0.7–3.1)
Lymphs: 28 %
MCH: 31.5 pg (ref 26.6–33.0)
MCHC: 34.2 g/dL (ref 31.5–35.7)
MCV: 92 fL (ref 79–97)
Monocytes Absolute: 0.7 10*3/uL (ref 0.1–0.9)
Monocytes: 9 %
Neutrophils Absolute: 4.5 10*3/uL (ref 1.4–7.0)
Neutrophils: 58 %
Platelets: 256 10*3/uL (ref 150–450)
RBC: 3.97 x10E6/uL — ABNORMAL LOW (ref 4.14–5.80)
RDW: 12.1 % (ref 11.6–15.4)
WBC: 7.7 10*3/uL (ref 3.4–10.8)

## 2020-03-01 LAB — COMP. METABOLIC PANEL (12)
AST: 20 IU/L (ref 0–40)
Albumin/Globulin Ratio: 1.2 (ref 1.2–2.2)
Albumin: 3.8 g/dL (ref 3.8–4.8)
Alkaline Phosphatase: 65 IU/L (ref 39–117)
BUN/Creatinine Ratio: 10 (ref 10–24)
BUN: 12 mg/dL (ref 8–27)
Bilirubin Total: 0.5 mg/dL (ref 0.0–1.2)
Calcium: 9.2 mg/dL (ref 8.6–10.2)
Chloride: 104 mmol/L (ref 96–106)
Creatinine, Ser: 1.25 mg/dL (ref 0.76–1.27)
GFR calc Af Amer: 69 mL/min/{1.73_m2} (ref >59)
GFR calc non Af Amer: 60 mL/min/{1.73_m2} (ref >59)
Globulin, Total: 3.1 g/dL (ref 1.5–4.5)
Glucose: 88 mg/dL (ref 65–99)
Potassium: 4.6 mmol/L (ref 3.5–5.2)
Sodium: 138 mmol/L (ref 134–144)
Total Protein: 6.9 g/dL (ref 6.0–8.5)

## 2020-03-01 LAB — VITAMIN D 25 HYDROXY (VIT D DEFICIENCY, FRACTURES): Vit D, 25-Hydroxy: 23.9 ng/mL — ABNORMAL LOW (ref 30.0–100.0)

## 2020-03-01 LAB — TSH: TSH: 2.02 u[IU]/mL (ref 0.450–4.500)

## 2020-03-03 NOTE — Addendum Note (Signed)
Addended by: Kennieth Rad on: 03/03/2020 10:10 AM   Modules accepted: Orders

## 2020-03-04 NOTE — Congregational Nurse Program (Signed)
  Dept: [facility] Nurse Program Note  Date of Encounter: 03/04/2020  Past Medical History: Past Medical History:  Diagnosis Date  . Asthma   . Cataract   . COPD (chronic obstructive pulmonary disease) ([place])   . Hypertension     Encounter Details: CNP Questionnaire - 03/04/20 1603      Questionnaire   Patient Status  Not Applicable    Race  Black or African American    Location Patient Served At  [facility]    Uninsured  Not Applicable    Food  No food insecurities    Housing/Utilities  No permanent housing    Transportation  No transportation needs    Interpersonal Safety  Yes, feel physically and emotionally safe where you currently live    Medication  Yes, have medication insecurities    Medical Provider  Yes    Referrals  Area Agency;Primary Care Provider/Clinic    ED Visit Averted  Not Applicable    Life-Saving Intervention Made  Not Applicable      Client in states he was seen on mobile unit last week and was prescribed medications but has not picked them up ,denies being suicidal or wanting to hurt /harm self ,states no need to worry about housing I know they are working on it. States I have had all my thoughts since I was 64 years old, had lots of houses and was really up there ,fell down and now what do I have ?  Seems to worry about where life has taken him and what he is experiencing now . Talks about being the youngest in his family and brothers that died ,talks about relationship with daughter ! States one minute he may would like to have a counselor to talk with him but then states they cant do anything! Nurse allowed client to talk ,listened ,offered support and resources of [place] ,was somewhat receptive ,will give him name and number to call for an assessment ,talked about medications he was prescribed and encouraged him to fill and see if they help him with his mood and sleeping. To  return to mobile [facility] this week ,to see PCP on 03-18-2020, wanted to know lab results.   Follow weekly.

## 2020-03-06 ENCOUNTER — Ambulatory Visit: Payer: Medicaid Other | Admitting: Physician Assistant

## 2020-03-06 ENCOUNTER — Other Ambulatory Visit: Payer: Self-pay

## 2020-03-06 VITALS — BP 133/80 | HR 80 | Temp 98.2°F | Resp 18 | Ht 76.0 in | Wt 250.0 lb

## 2020-03-06 DIAGNOSIS — R5383 Other fatigue: Secondary | ICD-10-CM

## 2020-03-06 DIAGNOSIS — D649 Anemia, unspecified: Secondary | ICD-10-CM

## 2020-03-06 DIAGNOSIS — F332 Major depressive disorder, recurrent severe without psychotic features: Secondary | ICD-10-CM

## 2020-03-06 DIAGNOSIS — E559 Vitamin D deficiency, unspecified: Secondary | ICD-10-CM

## 2020-03-06 NOTE — Patient Instructions (Signed)
Begin taking vitamin D over-the-counter, 2000 units a day.  We are checking your iron, folate acid and vitamin B12 today.  We will notify you if you to add any of that to your daily regimen.  I strongly encourage that you start the Abilify and Remeron.  If you need this prior to your upcoming appointment to establish primary care on May 18, please feel free to revisit [place] at the mobile unit.  Thank you  Kennieth Rad, PA-C Physician Assistant [facility] [email or web address]    Vitamin D Deficiency Vitamin D deficiency is when your body does not have enough vitamin D. Vitamin D is important to your body for many reasons:  It helps the body absorb two important minerals--calcium and phosphorus.  It plays a role in bone health.  It may help to prevent some diseases, such as diabetes and multiple sclerosis.  It plays a role in muscle function, including heart function. If vitamin D deficiency is severe, it can cause a condition in which your bones become soft. In adults, this condition is called osteomalacia. In children, this condition is called rickets. What are the causes? This condition may be caused by:  Not eating enough foods that contain vitamin D.  Not getting enough natural sun exposure.  Having certain digestive system diseases that make it difficult for your body to absorb vitamin D. These diseases include Crohn's disease, chronic pancreatitis, and cystic fibrosis.  Having a surgery in which a part of the stomach or a part of the small intestine is removed.  Having chronic kidney disease or liver disease. What increases the risk? You are more likely to develop this condition if you:  Are older.  Do not spend much time outdoors.  Live in a long-term care facility.  Have had broken bones.  Have weak or thin bones (osteoporosis).  Have a disease or condition that changes how the body absorbs vitamin  D.  Have dark skin.  Take certain medicines, such as steroid medicines or certain seizure medicines.  Are overweight or obese. What are the signs or symptoms? In mild cases of vitamin D deficiency, there may not be any symptoms. If the condition is severe, symptoms may include:  Bone pain.  Muscle pain.  Falling often.  Broken bones caused by a minor injury. How is this diagnosed? This condition may be diagnosed with blood tests. Imaging tests such as X-rays may also be done to look for changes in the bone. How is this treated? Treatment for this condition may depend on what caused the condition. Treatment options include:  Taking vitamin D supplements. Your health care provider will suggest what dose is best for you.  Taking a calcium supplement. Your health care provider will suggest what dose is best for you. Follow these instructions at home: Eating and drinking   Eat foods that contain vitamin D. Choices include: ? Fortified dairy products, cereals, or juices. Fortified means that vitamin D has been added to the food. Check the label on the package to see if the food is fortified. ? Fatty fish, such as salmon or trout. ? Eggs. ? Oysters. ? Mushrooms. The items listed above may not be a complete list of recommended foods and beverages. Contact a dietitian for more information. General instructions  Take medicines and supplements only as told by your health care provider.  Get regular, safe exposure to natural sunlight.  Do not use a tanning bed.  Maintain a healthy weight. Lose weight if  needed.  Keep all follow-up visits as told by your health care provider. This is important. How is this prevented? You can get vitamin D by:  Eating foods that naturally contain vitamin D.  Eating or drinking products that have been fortified with vitamin D, such as cereals, juices, and dairy products (including milk).  Taking a vitamin D supplement or a multivitamin  supplement that contains vitamin D.  Being in the sun. Your body naturally makes vitamin D when your skin is exposed to sunlight. Your body changes the sunlight into a form of the vitamin that it can use. Contact a health care provider if:  Your symptoms do not go away.  You feel nauseous or you vomit.  You have fewer bowel movements than usual or are constipated. Summary  Vitamin D deficiency is when your body does not have enough vitamin D.  Vitamin D is important to your body for good bone health and muscle function, and it may help prevent some diseases.  Vitamin D deficiency is primarily treated through supplementation. Your health care provider will suggest what dose is best for you.  You can get vitamin D by eating foods that contain vitamin D, by being in the sun, and by taking a vitamin D supplement or a multivitamin supplement that contains vitamin D. This information is not intended to replace advice given to you by your health care provider. Make sure you discuss any questions you have with your health care provider. Document Revised: 06/26/2018 Document Reviewed: 06/26/2018 Elsevier Patient Education  [employer].

## 2020-03-06 NOTE — Progress Notes (Signed)
Patient received results in person during FU.

## 2020-03-06 NOTE — Progress Notes (Signed)
Established Patient Office Visit  Subjective:  Patient ID: Robert Chan, male    DOB: 1956-02-22  Age: 64 y.o. MRN: [id number]  CC:  Chief Complaint  Patient presents with  . Fatigue    HPI [name] reports that he continues to have fatigue on a daily basis.  Reports that he was not able to pick up his medications last week, does have plans to pick them up and start them today.  No new concerns at this time.     Past Medical History:  Diagnosis Date  . Asthma   . Cataract   . COPD (chronic obstructive pulmonary disease) ([place])   . Hypertension     Past Surgical History:  Procedure Laterality Date  . APPENDECTOMY    . EYE SURGERY    . PROSTATE SURGERY      Family History  Problem Relation Age of Onset  . Hypertension Mother   . Diabetes Mother   . Hypertension Father   . Hypertension Sister     Social History   Socioeconomic History  . Marital status: Single    Spouse name: Not on file  . Number of children: Not on file  . Years of education: Not on file  . Highest education level: Not on file  Occupational History  . Not on file  Tobacco Use  . Smoking status: Current Some Day Smoker  . Smokeless tobacco: Never Used  Substance and Sexual Activity  . Alcohol use: Yes  . Drug use: No  . Sexual activity: Yes  Other Topics Concern  . Not on file  Social History Narrative  . Not on file   Social Determinants of Health   Financial Resource Strain:   . Difficulty of Paying Living Expenses:   Food Insecurity:   . Worried About Charity fundraiser in the Last Year:   . Arboriculturist in the Last Year:   Transportation Needs:   . Film/video editor (Medical):   Marland Kitchen Lack of Transportation (Non-Medical):   Physical Activity:   . Days of Exercise per Week:   . Minutes of Exercise per Session:   Stress:   . Feeling of Stress :   Social Connections:   . Frequency of Communication with Friends and Family:   . Frequency of Social  Gatherings with Friends and Family:   . Attends Religious Services:   . Active Member of Clubs or Organizations:   . Attends Archivist Meetings:   Marland Kitchen Marital Status:   Intimate Partner Violence:   . Fear of Current or Ex-Partner:   . Emotionally Abused:   Marland Kitchen Physically Abused:   . Sexually Abused:     Outpatient Medications Prior to Visit  Medication Sig Dispense Refill  . albuterol (VENTOLIN HFA) 108 (90 Base) MCG/ACT inhaler Inhale 1-2 puffs into the lungs every 6 (six) hours as needed for wheezing or shortness of breath. 90 g 2  . ARIPiprazole (ABILIFY) 2 MG tablet Take 1 tablet (2 mg total) by mouth daily. 30 tablet 0  . mirtazapine (REMERON) 15 MG tablet Take 1 tablet (15 mg total) by mouth at bedtime. 30 tablet 2  . SYMBICORT 160-4.5 MCG/ACT inhaler      No facility-administered medications prior to visit.    No Known Allergies  ROS Review of Systems  Constitutional: Positive for fatigue.  HENT: Negative.   Eyes: Negative.   Respiratory: Negative.   Cardiovascular: Negative.   Gastrointestinal: Negative.   Genitourinary: Negative.  Musculoskeletal: Negative.   Allergic/Immunologic: Negative.   Neurological: Negative.   Hematological: Negative.   Psychiatric/Behavioral: Positive for dysphoric mood and sleep disturbance. Negative for self-injury and suicidal ideas. The patient is nervous/anxious.       Objective:    Physical Exam  Constitutional: He is oriented to person, place, and time. He appears well-developed and well-nourished.  HENT:  Head: Normocephalic and atraumatic.  Right Ear: External ear normal.  Left Ear: External ear normal.  Nose: Nose normal.  Mouth/Throat: Oropharynx is clear and moist.  Eyes: Pupils are equal, round, and reactive to light. Conjunctivae and EOM are normal.  Cardiovascular: Normal rate and regular rhythm.  Pulmonary/Chest: Effort normal and breath sounds normal.  Abdominal: Soft. Bowel sounds are normal.   Musculoskeletal:        General: Normal range of motion.     Cervical back: Normal range of motion and neck supple.  Neurological: He is alert and oriented to person, place, and time. He has normal reflexes.  Skin: Skin is warm and dry.  Psychiatric: He has a normal mood and affect. His behavior is normal. Judgment and thought content normal.  Nursing note and vitals reviewed.                                                              BP 133/80 (BP Location: Left Arm, Patient Position: Sitting, Cuff Size: Large)   Pulse 80   Temp 98.2 F (36.8 C) (Oral)   Resp 18   Ht 6\' 4"  (1.93 m)   Wt 250 lb (113.4 kg)   SpO2 95%   BMI 30.43 kg/m  Wt Readings from Last 3 Encounters:  03/06/20 250 lb (113.4 kg)  03/04/20 250 lb (113.4 kg)  02/26/20 250 lb (113.4 kg)     Health Maintenance Due  Topic Date Due  . HIV Screening  Never done  . COVID-19 Vaccine (1) Never done  . TETANUS/TDAP  Never done  . COLON CANCER SCREENING ANNUAL FOBT  Never done  . PNA vac Low Risk Adult (1 of 2 - PCV13) Never done    There are no preventive care reminders to display for this patient.  Lab Results  Component Value Date   TSH 2.020 02/28/2020   Lab Results  Component Value Date   WBC 7.7 02/28/2020   HGB 12.5 (L) 02/28/2020   HCT 36.6 (L) 02/28/2020   MCV 92 02/28/2020   PLT 256 02/28/2020   Lab Results  Component Value Date   NA 138 02/28/2020   K 4.6 02/28/2020   CO2 24 09/23/2019   GLUCOSE 88 02/28/2020   BUN 12 02/28/2020   CREATININE 1.25 02/28/2020   BILITOT 0.5 02/28/2020   ALKPHOS 65 02/28/2020   AST 20 02/28/2020   PROT 6.9 02/28/2020   ALBUMIN 3.8 02/28/2020   CALCIUM 9.2 02/28/2020   ANIONGAP 8 09/23/2019   Lab Results  Component Value Date   CHOL 187 09/25/2019   Lab Results  Component Value Date   HDL 59 09/25/2019   Lab Results  Component Value Date   LDLCALC 114 (H) 09/25/2019   Lab Results  Component Value Date   TRIG 69  09/25/2019   Lab Results  Component Value Date   CHOLHDL 3.2 09/25/2019   Lab Results  Component Value Date   HGBA1C 5.1 09/25/2019      Assessment & Plan:   Problem List Items Addressed This Visit      Other   Severe recurrent major depression without psychotic features ([place])    Other Visit Diagnoses    Anemia, unspecified type    -  Primary   Vitamin D deficiency       Fatigue, unspecified type        1. Anemia, unspecified type  - B12 and Folate Panel - Iron, TIBC and Ferritin Panel  2. Vitamin D deficiency Patient encouraged to take 2000 units of vitamin D over-the-counter on a daily basis, states that he does have a current supply  3. Fatigue, unspecified type Continue with rule out of anemia  4. Severe recurrent major depression without psychotic features ([place]) Strongly encouraged compliance to medication.  Patient understands and agrees   I have reviewed the patient's medical history (PMH, PSH, Social History, Family History, Medications, and allergies) , and have been updated if relevant. I spent 20 minutes reviewing chart and  face to face time with patient.    No orders of the defined types were placed in this encounter.   Follow-up: Return if symptoms worsen or fail to improve.    Loraine Grip Mayers, PA-C

## 2020-03-07 LAB — IRON,TIBC AND FERRITIN PANEL
Ferritin: 159 ng/mL (ref 30–400)
Iron Saturation: 42 % (ref 15–55)
Iron: 103 ug/dL (ref 38–169)
Total Iron Binding Capacity: 243 ug/dL — ABNORMAL LOW (ref 250–450)
UIBC: 140 ug/dL (ref 111–343)

## 2020-03-07 LAB — B12 AND FOLATE PANEL
Folate: 13.6 ng/mL (ref >3.0)
Vitamin B-12: 383 pg/mL (ref 232–1245)

## 2020-03-13 ENCOUNTER — Telehealth: Payer: Self-pay | Admitting: *Deleted

## 2020-03-13 NOTE — Telephone Encounter (Signed)
-----   Message from Robert Chan, [place] sent at 03/10/2020  9:04 AM EDT ----- Please call patient and let him know that his vitamin B12, folate, and iron were within normal limits.  Please encourage him to keep his follow-up appointment on the 18th to establish primary care and further evaluation of his fatigue.Thank you

## 2020-03-18 ENCOUNTER — Telehealth: Payer: Medicaid Other | Admitting: Nurse Practitioner

## 2020-03-26 NOTE — Congregational Nurse Program (Signed)
  Dept: [facility] Nurse Program Note  Date of Encounter: 03/26/2020  Past Medical History: Past Medical History:  Diagnosis Date  . Asthma   . Cataract   . COPD (chronic obstructive pulmonary disease) ([place])   . Hypertension     Encounter Details: CNP Questionnaire - 03/26/20 1807      Questionnaire   Patient Status  Not Applicable    Race  Black or African American    Location Patient Served At  [place]    Uninsured  Not Applicable    Food  No food insecurities    Housing/Utilities  No permanent housing    Interpersonal Safety  Yes, feel physically and emotionally safe where you currently live    Medication  No medication insecurities    Medical Provider  No    Referrals  Primary Care Provider/Clinic    ED Visit Averted  Not Applicable    Life-Saving Intervention Made  Not Applicable     In for check in visit with nurse. States he did visit the mobile [place] and everything checked out fine ,blood work was fine ,states he is feeling better . Didn't keep PCP visit at [facility] since he visited the mobile moved his appointment up to 04-20-2020. He is trying to be patient   And wait on housing ,understands he needs to hang n but it is difficult.  Visited the gym today and spent time with grand kids and daughter .appeared to be relaxed today and more himself . Check in weekly

## 2020-04-02 NOTE — Congregational Nurse Program (Signed)
  Dept: [facility] Nurse Program Note  Date of Encounter: 04/02/2020  Past Medical History: Past Medical History:  Diagnosis Date  . Asthma   . Cataract   . COPD (chronic obstructive pulmonary disease) ([place])   . Hypertension     Encounter Details: CNP Questionnaire - 04/02/20 1709      Questionnaire   Patient Status  Not Applicable    Race  Black or African American    Location Patient Served At  Not [place]    Uninsured  Not Applicable    Food  No food insecurities    Housing/Utilities  No permanent housing    Transportation  No transportation needs    Interpersonal Safety  Yes, feel physically and emotionally safe where you currently live    Medication  No medication insecurities    Medical Provider  Yes    Referrals  Primary Care Provider/Clinic    ED Visit Averted  Not Applicable    Life-Saving Intervention Made  Not Applicable     brief  Visit to update nurse,doing okay he states . Started  taking medication ability   makes him feel sleepy he states . Taking low dose . Will take longer and let PCP know if he will continue it. Still searching for housing . follow as needed.

## 2020-04-04 ENCOUNTER — Ambulatory Visit (HOSPITAL_COMMUNITY)
Admission: AD | Admit: 2020-04-04 | Discharge: 2020-04-04 | Disposition: A | Discharge status: Home / Self Care | Payer: Medicare HMO | Attending: Psychiatry | Admitting: Psychiatry

## 2020-04-04 DIAGNOSIS — Z7289 Other problems related to lifestyle: Secondary | ICD-10-CM | POA: Diagnosis not present

## 2020-04-04 DIAGNOSIS — F332 Major depressive disorder, recurrent severe without psychotic features: Secondary | ICD-10-CM | POA: Diagnosis not present

## 2020-04-04 DIAGNOSIS — R45850 Homicidal ideations: Secondary | ICD-10-CM | POA: Diagnosis not present

## 2020-04-04 NOTE — H&P (Signed)
Behavioral Health Medical Screening Exam  Robert Chan is an 64 y.o. male.  Patient presents to [facility] behavioral health voluntarily for walk-in assessment.  Patient alert and oriented, answers appropriately. Patient reports "I want to get it together, I do not want to kill myself." Patient reports having 3 brothers who were deceased related to substance use disorder.  Patient states "I see what happened to my brothers and I do not want to die, I do not want to continue this way, I do not want to hurt me." Patient reports he is currently residing at the [employer] while working with [place] to secure housing.  Patient reports he is currently not employed.  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 Patient denies suicidal ideations at this time.  Patient reports 2 prior suicide attempts states "though I never wanted to die."  Patient denies homicidal ideations.  Patient denies auditory visual hallucinations.  Patient denies symptoms of paranoia. Patient reports he has enrolled in multiple substance use treatment facilities in the past.  Patient reports plan [place] as these have been effective for him in the past. Patient request bus pass to return to [employer], bus pass provided.  Substance use treatment resources provided.  Support and encouragement provided.  Total Time spent with patient: 30 minutes  Psychiatric Specialty Exam: Physical Exam  Nursing note and vitals reviewed. Constitutional: He is oriented to person, place, and time. He appears well-developed.  HENT:  Head: Normocephalic.  Cardiovascular: Normal rate.  Respiratory: Effort normal.  Neurological: He is alert and oriented to person, place, and time.  Psychiatric: He has a normal mood and affect. His speech is normal. Judgment normal. Cognition and memory are normal.   Review of Systems  Constitutional: Negative.   HENT:  Negative.   Eyes: Negative.   Respiratory: Negative.   Cardiovascular: Negative.   Gastrointestinal: Negative.   Genitourinary: Negative.   Musculoskeletal: Negative.   Skin: Negative.   Neurological: Negative.   Psychiatric/Behavioral: Negative.    There were no vitals taken for this visit.There is no height or weight on file to calculate BMI. General Appearance: Casual and Fairly Groomed Eye Contact:  Good Speech:  Clear and Coherent and Normal Rate Volume:  Normal Mood:  Euthymic Affect:  Appropriate and Congruent Thought Process:  Coherent, Goal Directed and Descriptions of Associations: Intact Orientation:  Full (Time, Place, and Person) Thought Content:  WDL and Logical Suicidal Thoughts:  No Homicidal Thoughts:  No Memory:  Immediate;   Good Recent;   Good Remote;   Good Judgement:  Fair Insight:  Fair Psychomotor Activity:  Normal Concentration: Concentration: Fair and Attention Span: Fair Recall:  [employer] of Knowledge:Fair Language: Fair Akathisia:  No Handed:  Right AIMS (if indicated):    Assets:  Communication Skills Desire for Improvement Financial Resources/Insurance Intimacy Leisure Time Physical Health Resilience Social Support Sleep:     Musculoskeletal: Strength & Muscle Tone: within normal limits Gait & Station: normal Patient leans: N/A  There were no vitals taken for this visit.  Recommendations:  Patient discussed with Dr. Dwyane Dee. Patient reports plan to follow-up with outpatient substance use treatment resources. Based on my evaluation the patient does not appear to have an emergency medical condition.  Emmaline Kluver, FNP 04/04/2020, 3:35 PM
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
## 2020-04-09 NOTE — Congregational Nurse Program (Signed)
  Dept: [facility] Nurse Program Note  Date of Encounter: 04/09/2020  Past Medical History: Past Medical History:  Diagnosis Date  . Asthma   . Cataract   . COPD (chronic obstructive pulmonary disease) ([place])   . Hypertension     Encounter Details: CNP Questionnaire - 04/09/20 1631      Questionnaire   Race  Black or African American    Location Patient Served At  [place]    Uninsured  Not Applicable    Food  No food insecurities    Housing/Utilities  No permanent housing    Transportation  No transportation needs    Interpersonal Safety  Yes, feel physically and emotionally safe where you currently live    Medication  Yes, have medication insecurities    Medical Provider  Yes    Referrals  Primary Care Provider/Clinic;Area Agency    ED Visit Averted  Not Applicable    Life-Saving Intervention Made  Not Applicable     Brief visit, client not happy with his attempt to get his Mental Health medication refilled [facility] ,states he has had an assessment but couldn't be seen. Referral to [facility] ,client to call fore an appointment . Needed to know date for upcoming PCP appointment  To establish care ,office closed at this time.  Client appeared to have more on his mind but stated he was okay and will decide on services at [facility] services . Will need refill on [facility]

## 2020-04-16 NOTE — Congregational Nurse Program (Signed)
  Dept: [facility] Nurse Program Note  Date of Encounter: 04/15/2020  Past Medical History: Past Medical History:  Diagnosis Date  . Asthma   . Cataract   . COPD (chronic obstructive pulmonary disease) ([place])   . Hypertension     Encounter Details:  CNP Questionnaire - 04/16/20 1502      Questionnaire   Patient Status Not Applicable    Race Black or African American    Location Patient Served At [place]    Uninsured Not Applicable    Food No food insecurities    Housing/Utilities No permanent housing    Transportation No transportation needs    Interpersonal Safety Yes, feel physically and emotionally safe where you currently live    Medication No medication insecurities    Medical Provider No    Referrals Primary Care Provider/Clinic;Area Agency;Behavioral/Mental Health Provider    ED Visit Averted Not Applicable    Life-Saving Intervention Made Not Applicable         Client checking in today ,expressed that he must stay on target and get the things done that he needs to ,nurse was supportive and listened ,still need to know of appointment time for establish care with PCP. Nurse called [facility] appointment will be Monday at 8:50 am ,given time and states he can make appointment . Mentioned blood pressur and when taken was very high ,counseled and will follow up tomorrow and if still high will refer to mobile van . States he had high blood pressure but stopped taking his medication . Given information on high blood pressure . Recheck blood pressure tomorrow

## 2020-04-21 ENCOUNTER — Ambulatory Visit: Payer: Medicaid Other | Admitting: Nurse Practitioner

## 2020-04-29 NOTE — Congregational Nurse Program (Signed)
  Dept: [facility] Nurse Program Note  Date of Encounter: 04/29/2020  Past Medical History: Past Medical History:  Diagnosis Date  . Asthma   . Cataract   . COPD (chronic obstructive pulmonary disease) ([place])   . Hypertension     Encounter Details:  CNP Questionnaire - 04/29/20 1922      Questionnaire   Patient Status Not Applicable    Race Black or African American    Location Patient Served At [place]    Uninsured Not Applicable    Food No food insecurities    Housing/Utilities No permanent housing    Transportation Yes, need transportation assistance    Interpersonal Safety Yes, feel physically and emotionally safe where you currently live    Medication Yes, have medication insecurities    Medical Provider No    Referrals Primary Care Provider/Clinic    ED Visit Averted Not Applicable    Life-Saving Intervention Made Not Applicable         Missed his PCP appointment as he forgot about a court date and had to be in jail for 4 days . Very positive about picking his life up and finding out what he needs and wants for himself. Has family members that want him to move closer to him but he wants to start fresh for himself. Seems to want to start fresh but lack something is still holding him back . Needs medication refills for his asthma  And COPD but hasnt been able to keep his PCP appointments for various reasons ,nurse will try to get rescheduled for him ,referred back to mobile unit for medications for asthma.  States he is managing his depression right now staying busy . Not taking the Ability. Follow as needed weekly  Reschedule PCP

## 2020-05-02 ENCOUNTER — Encounter (HOSPITAL_COMMUNITY): Payer: Self-pay | Admitting: Emergency Medicine

## 2020-05-02 ENCOUNTER — Emergency Department (HOSPITAL_COMMUNITY)
Admission: EM | Admit: 2020-05-02 | Discharge: 2020-05-02 | Disposition: A | Discharge status: Home / Self Care | Payer: Medicare HMO | Attending: Emergency Medicine | Admitting: Emergency Medicine

## 2020-05-02 ENCOUNTER — Emergency Department (HOSPITAL_COMMUNITY): Payer: Medicare HMO

## 2020-05-02 DIAGNOSIS — Z789 Other specified health status: Secondary | ICD-10-CM

## 2020-05-02 DIAGNOSIS — F17210 Nicotine dependence, cigarettes, uncomplicated: Secondary | ICD-10-CM | POA: Insufficient documentation

## 2020-05-02 DIAGNOSIS — I10 Essential (primary) hypertension: Secondary | ICD-10-CM | POA: Diagnosis not present

## 2020-05-02 DIAGNOSIS — J441 Chronic obstructive pulmonary disease with (acute) exacerbation: Secondary | ICD-10-CM | POA: Insufficient documentation

## 2020-05-02 DIAGNOSIS — R0789 Other chest pain: Secondary | ICD-10-CM | POA: Diagnosis present

## 2020-05-02 DIAGNOSIS — J4541 Moderate persistent asthma with (acute) exacerbation: Secondary | ICD-10-CM

## 2020-05-02 DIAGNOSIS — Z20822 Contact with and (suspected) exposure to covid-19: Secondary | ICD-10-CM | POA: Diagnosis not present

## 2020-05-02 DIAGNOSIS — Z79899 Other long term (current) drug therapy: Secondary | ICD-10-CM | POA: Insufficient documentation

## 2020-05-02 LAB — CBC
HCT: 39.7 % (ref 39.0–52.0)
Hemoglobin: 12.8 g/dL — ABNORMAL LOW (ref 13.0–17.0)
MCH: 30.9 pg (ref 26.0–34.0)
MCHC: 32.2 g/dL (ref 30.0–36.0)
MCV: 95.9 fL (ref 80.0–100.0)
Platelets: 227 10*3/uL (ref 150–400)
RBC: 4.14 MIL/uL — ABNORMAL LOW (ref 4.22–5.81)
RDW: 12 % (ref 11.5–15.5)
WBC: 13.1 10*3/uL — ABNORMAL HIGH (ref 4.0–10.5)
nRBC: 0 % (ref 0.0–0.2)

## 2020-05-02 LAB — HEPATIC FUNCTION PANEL
ALT: 54 U/L — ABNORMAL HIGH (ref 0–44)
AST: 63 U/L — ABNORMAL HIGH (ref 15–41)
Albumin: 4.2 g/dL (ref 3.5–5.0)
Alkaline Phosphatase: 51 U/L (ref 38–126)
Bilirubin, Direct: 0.3 mg/dL — ABNORMAL HIGH (ref 0.0–0.2)
Indirect Bilirubin: 0.9 mg/dL (ref 0.3–0.9)
Total Bilirubin: 1.2 mg/dL (ref 0.3–1.2)
Total Protein: 8 g/dL (ref 6.5–8.1)

## 2020-05-02 LAB — RAPID URINE DRUG SCREEN, HOSP PERFORMED
Amphetamines: NOT DETECTED
Barbiturates: NOT DETECTED
Benzodiazepines: POSITIVE — AB
Cocaine: POSITIVE — AB
Opiates: NOT DETECTED
Tetrahydrocannabinol: NOT DETECTED

## 2020-05-02 LAB — SARS CORONAVIRUS 2 BY RT PCR (HOSPITAL ORDER, PERFORMED IN ~~LOC~~ HOSPITAL LAB): SARS Coronavirus 2: NEGATIVE

## 2020-05-02 LAB — BASIC METABOLIC PANEL
Anion gap: 13 (ref 5–15)
BUN: 10 mg/dL (ref 8–23)
CO2: 21 mmol/L — ABNORMAL LOW (ref 22–32)
Calcium: 9.2 mg/dL (ref 8.9–10.3)
Chloride: 103 mmol/L (ref 98–111)
Creatinine, Ser: 1.36 mg/dL — ABNORMAL HIGH (ref 0.61–1.24)
GFR calc Af Amer: >60 mL/min (ref >60)
GFR calc non Af Amer: 54 mL/min — ABNORMAL LOW (ref >60)
Glucose, Bld: 108 mg/dL — ABNORMAL HIGH (ref 70–99)
Potassium: 4.1 mmol/L (ref 3.5–5.1)
Sodium: 137 mmol/L (ref 135–145)

## 2020-05-02 LAB — BRAIN NATRIURETIC PEPTIDE: B Natriuretic Peptide: 45.8 pg/mL (ref 0.0–100.0)

## 2020-05-02 LAB — TROPONIN I (HIGH SENSITIVITY)
Troponin I (High Sensitivity): 26 ng/L — ABNORMAL HIGH (ref <18)
Troponin I (High Sensitivity): 28 ng/L — ABNORMAL HIGH (ref <18)

## 2020-05-02 LAB — LIPASE, BLOOD: Lipase: 19 U/L (ref 11–51)

## 2020-05-02 MED ORDER — AEROCHAMBER PLUS FLO-VU LARGE MISC
Status: AC
  Filled 2020-05-02: qty 1

## 2020-05-02 MED ORDER — IPRATROPIUM-ALBUTEROL 0.5-2.5 (3) MG/3ML IN SOLN
3.0000 mL | Freq: Once | RESPIRATORY_TRACT | Status: AC
  Administered 2020-05-02: 3 mL via RESPIRATORY_TRACT
  Filled 2020-05-02: qty 3

## 2020-05-02 MED ORDER — AMLODIPINE BESYLATE 5 MG PO TABS
10.0000 mg | ORAL_TABLET | Freq: Once | ORAL | Status: AC
  Administered 2020-05-02: 10 mg via ORAL
  Filled 2020-05-02: qty 2

## 2020-05-02 MED ORDER — SYMBICORT 160-4.5 MCG/ACT IN AERO: 2.0000 | INHALATION_SPRAY | Freq: Two times a day (BID) | RESPIRATORY_TRACT | 3 refills | Status: DC

## 2020-05-02 MED ORDER — PREDNISONE 20 MG PO TABS
ORAL_TABLET | ORAL | 0 refills | Status: DC
Start: 2020-05-02 — End: 2020-06-05

## 2020-05-02 MED ORDER — ALBUTEROL SULFATE HFA 108 (90 BASE) MCG/ACT IN AERS
4.0000 | INHALATION_SPRAY | Freq: Once | RESPIRATORY_TRACT | Status: AC
  Administered 2020-05-02: 4 via RESPIRATORY_TRACT
  Filled 2020-05-02: qty 6.7

## 2020-05-02 MED ORDER — SODIUM CHLORIDE 0.9% FLUSH
3.0000 mL | Freq: Once | INTRAVENOUS | Status: AC
  Administered 2020-05-02: 3 mL via INTRAVENOUS

## 2020-05-02 MED ORDER — ALBUTEROL SULFATE (5 MG/ML) 0.5% IN NEBU
2.5000 mg | INHALATION_SOLUTION | Freq: Four times a day (QID) | RESPIRATORY_TRACT | 12 refills | Status: DC | PRN
Start: 2020-05-02 — End: 2020-06-19

## 2020-05-02 MED ORDER — AMLODIPINE BESYLATE 5 MG PO TABS
5.0000 mg | ORAL_TABLET | Freq: Every day | ORAL | 1 refills | Status: DC
Start: 2020-05-02 — End: 2020-05-22

## 2020-05-02 MED ORDER — IPRATROPIUM BROMIDE HFA 17 MCG/ACT IN AERS
2.0000 | INHALATION_SPRAY | Freq: Once | RESPIRATORY_TRACT | Status: AC
  Administered 2020-05-02: 2 via RESPIRATORY_TRACT
  Filled 2020-05-02: qty 12.9

## 2020-05-02 MED ORDER — PREDNISONE 20 MG PO TABS
ORAL_TABLET | ORAL | 0 refills | Status: DC
Start: 2020-05-02 — End: 2020-05-02

## 2020-05-02 MED ORDER — METHYLPREDNISOLONE SODIUM SUCC 125 MG IJ SOLR
125.0000 mg | Freq: Once | INTRAMUSCULAR | Status: AC
  Administered 2020-05-02: 125 mg via INTRAVENOUS
  Filled 2020-05-02: qty 2

## 2020-05-02 NOTE — ED Provider Notes (Signed)
[facility] EMERGENCY DEPARTMENT Provider Note   CSN: [id number] Arrival date & time: 05/02/20  0631     History Chief Complaint  Patient presents with   Chest Pain    Robert Chan is a 64 y.o. male.  HPI Patient has had chest tightness for about 3 days.  He reports it is a constant sensation and worse with deep breathing.  He reports he has asthma and he is trying to use his albuterol inhalers but not getting much relief.  He ran out of his Symbicort about a week ago.  Smokes several cigarettes per day.  He has been coughing.  No sharp chest pain with coughing.  Cough has been dry.  No fevers or myalgias.  Minor nasal congestion.  Patient had [name] and [employer] vaccine several months ago.  No leg swelling or calf pain.  Patient describes having had a cardiac catheterization a number of years ago.  He reports having had an episode of congestive heart failure in the past.  Patient advises he does sometimes smoke crack and cocaine.  No history of IV drug use.    Past Medical History:  Diagnosis Date   Asthma    Cataract    COPD (chronic obstructive pulmonary disease) ([place])    Hypertension     Patient Active Problem List   Diagnosis Date Noted   Foot callus 12/12/2019   Encounter for screening for COVID-19 12/12/2019   Severe recurrent major depression without psychotic features ([place]) 09/24/2019   Cocaine use disorder, mild, abuse ([place])    Elevated serum creatinine 08/28/2019   Moderate alcohol use disorder ([place]) 04/01/2018   Age-related nuclear cataract of right eye 09/13/2016   Benign essential hypertension 07/06/2016   Glaucoma 07/06/2016   Male erectile disorder 07/06/2016   Asthma 10/18/2012   COPD (chronic obstructive pulmonary disease) ([place]) 10/18/2012    Past Surgical History:  Procedure Laterality Date   APPENDECTOMY     EYE SURGERY     PROSTATE SURGERY         Family History  Problem Relation Age of Onset    Hypertension Mother    Diabetes Mother    Hypertension Father    Hypertension Sister     Social History   Tobacco Use   Smoking status: Current Some Day Smoker   Smokeless tobacco: Never Used  Substance Use Topics   Alcohol use: Yes   Drug use: No    Home Medications Prior to Admission medications   Medication Sig Start Date End Date Taking? Authorizing Provider  albuterol (VENTOLIN HFA) 108 (90 Base) MCG/ACT inhaler Inhale 1-2 puffs into the lungs every 6 (six) hours as needed for wheezing or shortness of breath. 10/31/19  Yes Jaynee Eagles, PA-C  ARIPiprazole (ABILIFY) 2 MG tablet Take 1 tablet (2 mg total) by mouth daily. 02/28/20  Yes Mayers, Cari S, PA-C  mirtazapine (REMERON) 15 MG tablet Take 1 tablet (15 mg total) by mouth at bedtime. 02/28/20 05/28/20 Yes Mayers, Cari S, PA-C  albuterol (PROVENTIL) (5 MG/ML) 0.5% nebulizer solution Take 0.5 mLs (2.5 mg total) by nebulization every 6 (six) hours as needed for wheezing or shortness of breath. 05/02/20   Charlesetta Shanks, MD  amLODipine (NORVASC) 5 MG tablet Take 1 tablet (5 mg total) by mouth daily. 05/02/20   Charlesetta Shanks, MD  predniSONE (DELTASONE) 20 MG tablet 3 tabs po day one, then 2 po daily x 4 days 05/02/20   Charlesetta Shanks, MD  SYMBICORT 160-4.5 MCG/ACT  inhaler Inhale 2 puffs into the lungs in the morning and at bedtime. 05/02/20   Charlesetta Shanks, MD    Allergies    Patient has no known allergies.  Review of Systems   Review of Systems 10 systems reviewed and negative except as per HPI Physical Exam Updated Vital Signs BP (!) 147/85    Pulse 98    Temp 98 F (36.7 C) (Oral)    Resp 18    Ht 6\' 4"  (1.93 m)    Wt 113.4 kg    SpO2 97%    BMI 30.43 kg/m   Physical Exam Constitutional:      Comments: Alert, nontoxic.  No respiratory distress at rest.  Occasional cough with deep breath.  HENT:     Mouth/Throat:     Mouth: Mucous membranes are moist.     Pharynx: Oropharynx is clear.  Eyes:     Extraocular  Movements: Extraocular movements intact.  Cardiovascular:     Rate and Rhythm: Normal rate and regular rhythm.  Pulmonary:     Comments: No respiratory distress at rest.  Occasional dry cough.  Wheezing throughout lung fields.  Patient does have fair airflow to the bases. Abdominal:     General: There is no distension.     Palpations: Abdomen is soft.     Tenderness: There is no abdominal tenderness. There is no guarding.  Musculoskeletal:        General: No swelling or tenderness. Normal range of motion.     Cervical back: Neck supple.     Right lower leg: No edema.     Left lower leg: No edema.  Skin:    General: Skin is warm and dry.  Neurological:     General: No focal deficit present.     Mental Status: He is oriented to person, place, and time.     Coordination: Coordination normal.  Psychiatric:        Mood and Affect: Mood normal.     ED Results / Procedures / Treatments   Labs (all labs ordered are listed, but only abnormal results are displayed) Labs Reviewed  BASIC METABOLIC PANEL - Abnormal; Notable for the following components:      Result Value   CO2 21 (*)    Glucose, Bld 108 (*)    Creatinine, Ser 1.36 (*)    GFR calc non Af Amer 54 (*)    All other components within normal limits  CBC - Abnormal; Notable for the following components:   WBC 13.1 (*)    RBC 4.14 (*)    Hemoglobin 12.8 (*)    All other components within normal limits  HEPATIC FUNCTION PANEL - Abnormal; Notable for the following components:   AST 63 (*)    ALT 54 (*)    Bilirubin, Direct 0.3 (*)    All other components within normal limits  RAPID URINE DRUG SCREEN, HOSP PERFORMED - Abnormal; Notable for the following components:   Cocaine POSITIVE (*)    Benzodiazepines POSITIVE (*)    All other components within normal limits  TROPONIN I (HIGH SENSITIVITY) - Abnormal; Notable for the following components:   Troponin I (High Sensitivity) 26 (*)    All other components within normal  limits  TROPONIN I (HIGH SENSITIVITY) - Abnormal; Notable for the following components:   Troponin I (High Sensitivity) 28 (*)    All other components within normal limits  SARS CORONAVIRUS 2 BY RT PCR (HOSPITAL ORDER, [place]  HOSPITAL LAB)  BRAIN NATRIURETIC PEPTIDE  LIPASE, BLOOD    EKG EKG Interpretation  Date/Time:  Friday May 02 2020 06:43:42 EDT Ventricular Rate:  113 PR Interval:  174 QRS Duration: 86 QT Interval:  330 QTC Calculation: 452 R Axis:   92 Text Interpretation: Sinus tachycardia Rightward axis Borderline ECG faster rate o/w no change from previous Confirmed by Charlesetta Shanks [phone]) on 05/02/2020 8:23:10 AM   Radiology DG Chest 2 View  Result Date: 05/02/2020 CLINICAL DATA:  Chest pain.  Shortness of breath. EXAM: CHEST - 2 VIEW COMPARISON:  09/23/2019. FINDINGS: Mediastinum hilar structures normal. Lungs are clear. Bilateral nipple shadows again noted. No pleural effusion or pneumothorax. Heart size normal. Degenerative change thoracic spine. IMPRESSION: No acute cardiopulmonary disease. Electronically Signed   By: Marcello Moores  Register   On: 05/02/2020 07:05    Procedures Procedures (including critical care time)  Medications Ordered in ED Medications  sodium chloride flush (NS) 0.9 % injection 3 mL (3 mLs Intravenous Given 05/02/20 1021)  methylPREDNISolone sodium succinate (SOLU-MEDROL) 125 mg/2 mL injection 125 mg (125 mg Intravenous Given 05/02/20 1021)  albuterol (VENTOLIN HFA) 108 (90 Base) MCG/ACT inhaler 4 puff (4 puffs Inhalation Given 05/02/20 0924)  ipratropium (ATROVENT HFA) inhaler 2 puff (2 puffs Inhalation Given 05/02/20 0923)  AeroChamber Plus Flo-Vu Large MISC (  Given 05/02/20 1021)  amLODipine (NORVASC) tablet 10 mg (10 mg Oral Given 05/02/20 1027)  ipratropium-albuterol (DUONEB) 0.5-2.5 (3) MG/3ML nebulizer solution 3 mL (3 mLs Nebulization Given 05/02/20 1144)    ED Course  I have reviewed the triage vital signs and the nursing  notes.  Pertinent labs & imaging results that were available during my care of the patient were reviewed by me and considered in my medical decision making (see chart for details).  Clinical Course as of May 03 1323  Fri May 02, 2020  1134 Patient reports he feels about 50% better after getting the inhaled albuterol and ipratropium with spacer and Solu-Medrol.  Repeat auscultation is for continued extensive wheezing but positive good airflow to the bases.  Covid has returned negative, will proceed with aerosol therapy.   [MP]    Clinical Course User Index [MP] Charlesetta Shanks, MD   MDM Rules/Calculators/A&P                         Patient experiencing chest tightness, wheezing and coughing.  Symptoms suggestive of asthma exacerbation.  Is much improved after treatment.  troponins are flat.  Chest x-ray clear.  At this time most consistent with asthma exacerbation.  Will have patient continue a burst of prednisone.  Also have dispensed inhalers with spacer for albuterol and Atrovent.  I have done refill for patient's Symbicort.  Patient has history of essential hypertension.  Blood pressures are elevated with fairly persistent diastolic hypertension by review of EMR.  Will have patient start amlodipine 5 mg daily and monitor pressures at home.  Patient reports he has a local PCP but needs to set up appointment.  He is encouraged to get set up ASAP for recheck.  Return precautions reviewed. Final Clinical Impression(s) / ED Diagnoses Final diagnoses:  Moderate persistent asthma with exacerbation  Essential hypertension  Has run out of medications    Rx / DC Orders ED Discharge Orders         Ordered    SYMBICORT 160-4.5 MCG/ACT inhaler  2 times daily     Discontinue  Reprint     05/02/20  1313    predniSONE (DELTASONE) 20 MG tablet  Status:  Discontinued     Reprint     05/02/20 1313    albuterol (PROVENTIL) (5 MG/ML) 0.5% nebulizer solution  Every 6 hours PRN     Discontinue  Reprint      05/02/20 1313    amLODipine (NORVASC) 5 MG tablet  Daily     Discontinue  Reprint     05/02/20 1313    predniSONE (DELTASONE) 20 MG tablet     Discontinue  Reprint     05/02/20 1316           Charlesetta Shanks, MD 05/02/20 1326

## 2020-05-02 NOTE — ED Triage Notes (Signed)
Pt c/o chest tightness x 2 days with worsening shob.

## 2020-05-02 NOTE — Discharge Instructions (Addendum)
1.  Fill your prescriptions for prednisone and start taking your first dose this evening.  Use your Symbicort inhaler twice a day as per your previous instructions.  Use albuterol inhaler 2 to 4 puffs with the spacer every 4 hours for the next 2 days, then go back to as needed.  If you are home, you may use your albuterol in the nebulizer machine. 2.  You have a history of high blood pressure.  You have been given a medication called amlodipine to take daily.  Follow-up with your family doctor for continued monitoring of your blood pressure and your response to this medication. 3.  Return to the emergency department immediately if you get any increased difficulty breathing, chest pain or worsening symptoms.

## 2020-05-02 NOTE — ED Notes (Signed)
Patient verbalizes understanding of discharge instructions. Opportunity for questioning and answers were provided. Pt discharged from ED. 

## 2020-05-06 NOTE — Congregational Nurse Program (Signed)
  Dept: [facility] Nurse Program Note  Date of Encounter: 05/06/2020  Past Medical History: Past Medical History:  Diagnosis Date  . Asthma   . Cataract   . COPD (chronic obstructive pulmonary disease) ([place])   . Hypertension     Encounter Details:  CNP Questionnaire - 05/06/20 1455      Questionnaire   Patient Status Not Applicable    Race Black or African American    Location Patient Served At Not Applicable    Insurance Medicare    Uninsured Not Applicable    Food No food insecurities    Housing/Utilities No permanent housing    Transportation No transportation needs    Interpersonal Safety Yes, feel physically and emotionally safe where you currently live    Medication No medication insecurities    Medical Provider No    Referrals Primary Care Provider/Clinic    ED Visit Averted Not Applicable    Life-Saving Intervention Made Not Applicable         In stating he was shaving his head and accidentally cut his head . Client has two small abrasions from shaver ,nurse assisted with removing old dried bandages and suggested he get something from pharmacy to keep mit from getting infected ,small bloody ,oozing area  ,will  refer  To mobile van to get some ointment for his head .  States he was hospitalized last week due to asthma , ,some shortness of breathe . States he has a lot going on .  Follow tomorrow to check on head abrasions

## 2020-05-08 ENCOUNTER — Telehealth: Payer: Self-pay

## 2020-05-08 NOTE — Telephone Encounter (Signed)
Note left in clients mailbox to see mobile [place] services regarding injury and ointment for cut

## 2020-05-22 ENCOUNTER — Other Ambulatory Visit: Payer: Self-pay

## 2020-05-22 ENCOUNTER — Ambulatory Visit: Payer: Medicare HMO | Admitting: Physician Assistant

## 2020-05-22 VITALS — BP 138/80 | HR 88 | Temp 98.7°F | Resp 18 | Ht 76.0 in | Wt 250.0 lb

## 2020-05-22 DIAGNOSIS — J410 Simple chronic bronchitis: Secondary | ICD-10-CM

## 2020-05-22 DIAGNOSIS — I10 Essential (primary) hypertension: Secondary | ICD-10-CM | POA: Diagnosis not present

## 2020-05-22 DIAGNOSIS — E559 Vitamin D deficiency, unspecified: Secondary | ICD-10-CM | POA: Diagnosis not present

## 2020-05-22 DIAGNOSIS — F332 Major depressive disorder, recurrent severe without psychotic features: Secondary | ICD-10-CM

## 2020-05-22 MED ORDER — SYMBICORT 160-4.5 MCG/ACT IN AERO: 2.0000 | INHALATION_SPRAY | Freq: Two times a day (BID) | RESPIRATORY_TRACT | 3 refills | Status: DC

## 2020-05-22 MED ORDER — ARIPIPRAZOLE 2 MG PO TABS: 2.0000 mg | ORAL_TABLET | Freq: Every day | ORAL | 0 refills | Status: DC

## 2020-05-22 MED ORDER — MIRTAZAPINE 15 MG PO TABS: 15.0000 mg | ORAL_TABLET | Freq: Every day | ORAL | 2 refills | Status: DC

## 2020-05-22 MED ORDER — AMLODIPINE BESYLATE 5 MG PO TABS: 5.0000 mg | ORAL_TABLET | Freq: Every day | ORAL | 1 refills | Status: DC

## 2020-05-22 MED ORDER — ALBUTEROL SULFATE HFA 108 (90 BASE) MCG/ACT IN AERS: 1.0000 | INHALATION_SPRAY | Freq: Four times a day (QID) | RESPIRATORY_TRACT | 2 refills | Status: DC | PRN

## 2020-05-22 NOTE — Progress Notes (Signed)
Established Patient Office Visit  Subjective:  Patient ID: Robert Chan, male    DOB: [birth year]  Age: 64 y.o. MRN: [id number]  CC:  Chief Complaint  Patient presents with  . Hypertension    HPI [name] presents for medication refills.  States that he did not keep his last two appts to establish care at [facility].  States that he is taking his medication as directed and it is offering relief. Is using rescue inhaler approx twice a day, states that he needs to use it when he is out in the heat. Requests disability paperwork completed for housing.  No other concerns at this time   Past Medical History:  Diagnosis Date  . Asthma   . Cataract   . COPD (chronic obstructive pulmonary disease) ([place])   . Hypertension     Past Surgical History:  Procedure Laterality Date  . APPENDECTOMY    . EYE SURGERY    . PROSTATE SURGERY      Family History  Problem Relation Age of Onset  . Hypertension Mother   . Diabetes Mother   . Hypertension Father   . Hypertension Sister     Social History   Socioeconomic History  . Marital status: Single    Spouse name: Not on file  . Number of children: Not on file  . Years of education: Not on file  . Highest education level: Not on file  Occupational History  . Not on file  Tobacco Use  . Smoking status: Current Some Day Smoker  . Smokeless tobacco: Never Used  Substance and Sexual Activity  . Alcohol use: Yes  . Drug use: No  . Sexual activity: Yes  Other Topics Concern  . Not on file  Social History Narrative  . Not on file   Social Determinants of Health   Financial Resource Strain:   . Difficulty of Paying Living Expenses:   Food Insecurity:   . Worried About Charity fundraiser in the Last Year:   . Arboriculturist in the Last Year:   Transportation Needs:   . Film/video editor (Medical):   Marland Kitchen Lack of Transportation (Non-Medical):   Physical Activity:   . Days of Exercise per Week:   . Minutes of  Exercise per Session:   Stress:   . Feeling of Stress :   Social Connections:   . Frequency of Communication with Friends and Family:   . Frequency of Social Gatherings with Friends and Family:   . Attends Religious Services:   . Active Member of Clubs or Organizations:   . Attends Archivist Meetings:   Marland Kitchen Marital Status:   Intimate Partner Violence:   . Fear of Current or Ex-Partner:   . Emotionally Abused:   Marland Kitchen Physically Abused:   . Sexually Abused:     Outpatient Medications Prior to Visit  Medication Sig Dispense Refill  . albuterol (PROVENTIL) (5 MG/ML) 0.5% nebulizer solution Take 0.5 mLs (2.5 mg total) by nebulization every 6 (six) hours as needed for wheezing or shortness of breath. 20 mL 12  . predniSONE (DELTASONE) 20 MG tablet 3 tabs po day one, then 2 po daily x 4 days 11 tablet 0  . albuterol (VENTOLIN HFA) 108 (90 Base) MCG/ACT inhaler Inhale 1-2 puffs into the lungs every 6 (six) hours as needed for wheezing or shortness of breath. 90 g 2  . amLODipine (NORVASC) 5 MG tablet Take 1 tablet (5 mg total) by mouth daily.  30 tablet 1  . ARIPiprazole (ABILIFY) 2 MG tablet Take 1 tablet (2 mg total) by mouth daily. 30 tablet 0  . mirtazapine (REMERON) 15 MG tablet Take 1 tablet (15 mg total) by mouth at bedtime. 30 tablet 2  . SYMBICORT 160-4.5 MCG/ACT inhaler Inhale 2 puffs into the lungs in the morning and at bedtime. 1 Inhaler 3   No facility-administered medications prior to visit.    No Known Allergies  ROS Review of Systems  Constitutional: Negative.   HENT: Negative.   Eyes: Negative.   Respiratory: Positive for shortness of breath. Negative for wheezing.   Cardiovascular: Negative.   Gastrointestinal: Negative.   Endocrine: Negative.   Genitourinary: Negative.   Musculoskeletal: Negative.   Skin: Negative.   Allergic/Immunologic: Negative.   Neurological: Negative.   Hematological: Negative.   Psychiatric/Behavioral: Negative.        Objective:    Physical Exam Vitals and nursing note reviewed.  Constitutional:      Appearance: Normal appearance. He is obese.  HENT:     Head: Normocephalic and atraumatic.     Right Ear: External ear normal.     Left Ear: External ear normal.     Nose: Nose normal.     Mouth/Throat:     Mouth: Mucous membranes are moist.     Pharynx: Oropharynx is clear.  Eyes:     Extraocular Movements: Extraocular movements intact.     Conjunctiva/sclera: Conjunctivae normal.     Pupils: Pupils are equal, round, and reactive to light.  Cardiovascular:     Rate and Rhythm: Normal rate and regular rhythm.     Pulses: Normal pulses.     Heart sounds: Normal heart sounds.  Pulmonary:     Effort: Pulmonary effort is normal.     Breath sounds: Normal breath sounds.  Abdominal:     General: Abdomen is flat. Bowel sounds are normal.     Palpations: Abdomen is soft.  Musculoskeletal:        General: Normal range of motion.     Cervical back: Normal range of motion.  Skin:    General: Skin is warm and dry.  Neurological:     General: No focal deficit present.     Mental Status: He is alert and oriented to person, place, and time.  Psychiatric:        Mood and Affect: Mood normal.        Behavior: Behavior normal.        Thought Content: Thought content normal.        Judgment: Judgment normal.     BP (!) 138/80 (BP Location: Left Arm, Patient Position: Sitting, Cuff Size: Large)   Pulse 88   Temp 98.7 F (37.1 C) (Oral)   Resp 18   Ht 6\' 4"  (1.93 m)   Wt (!) 250 lb (113.4 kg)   SpO2 96%   BMI 30.43 kg/m  Wt Readings from Last 3 Encounters:  05/22/20 (!) 250 lb (113.4 kg)  05/02/20 250 lb (113.4 kg)  04/15/20 250 lb (113.4 kg)     Health Maintenance Due  Topic Date Due  . COVID-19 Vaccine (1) Never done  . HIV Screening  Never done  . TETANUS/TDAP  Never done  . COLON CANCER SCREENING ANNUAL FOBT  Never done  . PNA vac Low Risk Adult (1 of 2 - PCV13) Never done     There are no preventive care reminders to display for this patient.  Lab Results  Component Value Date   TSH 2.020 02/28/2020   Lab Results  Component Value Date   WBC 13.1 (H) 05/02/2020   HGB 12.8 (L) 05/02/2020   HCT 39.7 05/02/2020   MCV 95.9 05/02/2020   PLT 227 05/02/2020   Lab Results  Component Value Date   NA 137 05/02/2020   K 4.1 05/02/2020   CO2 21 (L) 05/02/2020   GLUCOSE 108 (H) 05/02/2020   BUN 10 05/02/2020   CREATININE 1.36 (H) 05/02/2020   BILITOT 1.2 05/02/2020   ALKPHOS 51 05/02/2020   AST 63 (H) 05/02/2020   ALT 54 (H) 05/02/2020   PROT 8.0 05/02/2020   ALBUMIN 4.2 05/02/2020   CALCIUM 9.2 05/02/2020   ANIONGAP 13 05/02/2020   Lab Results  Component Value Date   CHOL 187 09/25/2019   Lab Results  Component Value Date   HDL 59 09/25/2019   Lab Results  Component Value Date   LDLCALC 114 (H) 09/25/2019   Lab Results  Component Value Date   TRIG 69 09/25/2019   Lab Results  Component Value Date   CHOLHDL 3.2 09/25/2019   Lab Results  Component Value Date   HGBA1C 5.1 09/25/2019      Assessment & Plan:   Problem List Items Addressed This Visit      Cardiovascular and Mediastinum   Benign essential hypertension   Relevant Medications   amLODipine (NORVASC) 5 MG tablet     Respiratory   COPD (chronic obstructive pulmonary disease) (HCC)   Relevant Medications   SYMBICORT 160-4.5 MCG/ACT inhaler   albuterol (VENTOLIN HFA) 108 (90 Base) MCG/ACT inhaler     Other   Severe recurrent major depression without psychotic features (HCC)   Relevant Medications   mirtazapine (REMERON) 15 MG tablet   ARIPiprazole (ABILIFY) 2 MG tablet    Other Visit Diagnoses    Vitamin D deficiency    -  Primary     1. Vitamin D deficiency Cont current regimen  2. Severe recurrent major depression without psychotic features (HCC) Cont current regimen - mirtazapine (REMERON) 15 MG tablet; Take 1 tablet (15 mg total) by mouth at bedtime.   Dispense: 30 tablet; Refill: 2 - ARIPiprazole (ABILIFY) 2 MG tablet; Take 1 tablet (2 mg total) by mouth daily.  Dispense: 30 tablet; Refill: 0  3. Benign essential hypertension Continue current regimen - amLODipine (NORVASC) 5 MG tablet; Take 1 tablet (5 mg total) by mouth daily.  Dispense: 30 tablet; Refill: 1  4. Simple chronic bronchitis (HCC) Continue current regimen - SYMBICORT 160-4.5 MCG/ACT inhaler; Inhale 2 puffs into the lungs in the morning and at bedtime.  Dispense: 1 Inhaler; Refill: 3 - albuterol (VENTOLIN HFA) 108 (90 Base) MCG/ACT inhaler; Inhale 1-2 puffs into the lungs every 6 (six) hours as needed for wheezing or shortness of breath.  Dispense: 90 g; Refill: 2  Encourage patient to follow-up with her security disability for disability paperwork completion.  Strongly encouraged patient to keep appointment with Dr. Joya Gaskins to establish primary care.   I have reviewed the patient's medical history (PMH, PSH, Social History, Family History, Medications, and allergies) , and have been updated if relevant. I spent 20 minutes reviewing chart and  face to face time with patient.     Meds ordered this encounter  Medications  . SYMBICORT 160-4.5 MCG/ACT inhaler    Sig: Inhale 2 puffs into the lungs in the morning and at bedtime.    Dispense:  1 Inhaler    Refill:  3    Order Specific Question:   Supervising Provider    Answer:   Elsie Stain [[id number]]  . mirtazapine (REMERON) 15 MG tablet    Sig: Take 1 tablet (15 mg total) by mouth at bedtime.    Dispense:  30 tablet    Refill:  2    Order Specific Question:   Supervising Provider    Answer:   Joya Gaskins, PATRICK E [[id number]]  . ARIPiprazole (ABILIFY) 2 MG tablet    Sig: Take 1 tablet (2 mg total) by mouth daily.    Dispense:  30 tablet    Refill:  0    Order Specific Question:   Supervising Provider    Answer:   Joya Gaskins, PATRICK E [[id number]]  . amLODipine (NORVASC) 5 MG tablet    Sig: Take 1 tablet (5 mg total) by mouth  daily.    Dispense:  30 tablet    Refill:  1    Order Specific Question:   Supervising Provider    Answer:   Joya Gaskins, PATRICK E [[id number]]  . albuterol (VENTOLIN HFA) 108 (90 Base) MCG/ACT inhaler    Sig: Inhale 1-2 puffs into the lungs every 6 (six) hours as needed for wheezing or shortness of breath.    Dispense:  90 g    Refill:  2    Order Specific Question:   Supervising Provider    Answer:   Noralyn Pick    Follow-up: Return in about 4 weeks (around 06/19/2020) for At [facility] with Dr. Joya Gaskins.    Loraine Grip Mayers, PA-C

## 2020-05-22 NOTE — Patient Instructions (Addendum)
Medications have been refilled on your behalf.  Please follow up with [employer] for form completion.  Kennieth Rad, PA-C Physician Assistant [facility] [email or web address]    Vitamin D Deficiency Vitamin D deficiency is when your body does not have enough vitamin D. Vitamin D is important to your body for many reasons:  It helps the body absorb two important minerals--calcium and phosphorus.  It plays a role in bone health.  It may help to prevent some diseases, such as diabetes and multiple sclerosis.  It plays a role in muscle function, including heart function. If vitamin D deficiency is severe, it can cause a condition in which your bones become soft. In adults, this condition is called osteomalacia. In children, this condition is called rickets. What are the causes? This condition may be caused by:  Not eating enough foods that contain vitamin D.  Not getting enough natural sun exposure.  Having certain digestive system diseases that make it difficult for your body to absorb vitamin D. These diseases include Crohn's disease, chronic pancreatitis, and cystic fibrosis.  Having a surgery in which a part of the stomach or a part of the small intestine is removed.  Having chronic kidney disease or liver disease. What increases the risk? You are more likely to develop this condition if you:  Are older.  Do not spend much time outdoors.  Live in a long-term care facility.  Have had broken bones.  Have weak or thin bones (osteoporosis).  Have a disease or condition that changes how the body absorbs vitamin D.  Have dark skin.  Take certain medicines, such as steroid medicines or certain seizure medicines.  Are overweight or obese. What are the signs or symptoms? In mild cases of vitamin D deficiency, there may not be any symptoms. If the condition is severe, symptoms may include:  Bone pain.  Muscle  pain.  Falling often.  Broken bones caused by a minor injury. How is this diagnosed? This condition may be diagnosed with blood tests. Imaging tests such as X-rays may also be done to look for changes in the bone. How is this treated? Treatment for this condition may depend on what caused the condition. Treatment options include:  Taking vitamin D supplements. Your health care provider will suggest what dose is best for you.  Taking a calcium supplement. Your health care provider will suggest what dose is best for you. Follow these instructions at home: Eating and drinking   Eat foods that contain vitamin D. Choices include: ? Fortified dairy products, cereals, or juices. Fortified means that vitamin D has been added to the food. Check the label on the package to see if the food is fortified. ? Fatty fish, such as salmon or trout. ? Eggs. ? Oysters. ? Mushrooms. The items listed above may not be a complete list of recommended foods and beverages. Contact a dietitian for more information. General instructions  Take medicines and supplements only as told by your health care provider.  Get regular, safe exposure to natural sunlight.  Do not use a tanning bed.  Maintain a healthy weight. Lose weight if needed.  Keep all follow-up visits as told by your health care provider. This is important. How is this prevented? You can get vitamin D by:  Eating foods that naturally contain vitamin D.  Eating or drinking products that have been fortified with vitamin D, such as cereals, juices, and dairy products (including milk).  Taking a vitamin D supplement  or a multivitamin supplement that contains vitamin D.  Being in the sun. Your body naturally makes vitamin D when your skin is exposed to sunlight. Your body changes the sunlight into a form of the vitamin that it can use. Contact a health care provider if:  Your symptoms do not go away.  You feel nauseous or you vomit.  You  have fewer bowel movements than usual or are constipated. Summary  Vitamin D deficiency is when your body does not have enough vitamin D.  Vitamin D is important to your body for good bone health and muscle function, and it may help prevent some diseases.  Vitamin D deficiency is primarily treated through supplementation. Your health care provider will suggest what dose is best for you.  You can get vitamin D by eating foods that contain vitamin D, by being in the sun, and by taking a vitamin D supplement or a multivitamin supplement that contains vitamin D. This information is not intended to replace advice given to you by your health care provider. Make sure you discuss any questions you have with your health care provider. Document Revised: 06/26/2018 Document Reviewed: 06/26/2018 Elsevier Patient Education  [place].

## 2020-05-22 NOTE — Progress Notes (Signed)
Patient has eaten but has not taken medication today.

## 2020-06-04 ENCOUNTER — Encounter (HOSPITAL_COMMUNITY): Payer: Self-pay | Admitting: Emergency Medicine

## 2020-06-04 ENCOUNTER — Observation Stay (HOSPITAL_COMMUNITY)
Admission: EM | Admit: 2020-06-04 | Discharge: 2020-06-05 | Disposition: A | Discharge status: Home / Self Care | Payer: Medicare HMO | Attending: Internal Medicine | Admitting: Internal Medicine

## 2020-06-04 ENCOUNTER — Other Ambulatory Visit: Payer: Self-pay

## 2020-06-04 ENCOUNTER — Emergency Department (HOSPITAL_COMMUNITY): Payer: Medicare HMO

## 2020-06-04 DIAGNOSIS — R079 Chest pain, unspecified: Principal | ICD-10-CM | POA: Diagnosis present

## 2020-06-04 DIAGNOSIS — F1490 Cocaine use, unspecified, uncomplicated: Secondary | ICD-10-CM | POA: Insufficient documentation

## 2020-06-04 DIAGNOSIS — F17200 Nicotine dependence, unspecified, uncomplicated: Secondary | ICD-10-CM | POA: Diagnosis not present

## 2020-06-04 DIAGNOSIS — Z20822 Contact with and (suspected) exposure to covid-19: Secondary | ICD-10-CM | POA: Insufficient documentation

## 2020-06-04 DIAGNOSIS — I10 Essential (primary) hypertension: Secondary | ICD-10-CM | POA: Insufficient documentation

## 2020-06-04 DIAGNOSIS — Z79899 Other long term (current) drug therapy: Secondary | ICD-10-CM | POA: Diagnosis not present

## 2020-06-04 DIAGNOSIS — J449 Chronic obstructive pulmonary disease, unspecified: Secondary | ICD-10-CM | POA: Insufficient documentation

## 2020-06-04 DIAGNOSIS — I249 Acute ischemic heart disease, unspecified: Secondary | ICD-10-CM | POA: Insufficient documentation

## 2020-06-04 DIAGNOSIS — Z720 Tobacco use: Secondary | ICD-10-CM

## 2020-06-04 DIAGNOSIS — R778 Other specified abnormalities of plasma proteins: Secondary | ICD-10-CM

## 2020-06-04 DIAGNOSIS — R443 Hallucinations, unspecified: Secondary | ICD-10-CM | POA: Diagnosis not present

## 2020-06-04 DIAGNOSIS — J45909 Unspecified asthma, uncomplicated: Secondary | ICD-10-CM | POA: Diagnosis present

## 2020-06-04 DIAGNOSIS — J4489 Other specified chronic obstructive pulmonary disease: Secondary | ICD-10-CM | POA: Diagnosis present

## 2020-06-04 DIAGNOSIS — I252 Old myocardial infarction: Secondary | ICD-10-CM

## 2020-06-04 DIAGNOSIS — F332 Major depressive disorder, recurrent severe without psychotic features: Secondary | ICD-10-CM

## 2020-06-04 DIAGNOSIS — R45851 Suicidal ideations: Secondary | ICD-10-CM

## 2020-06-04 LAB — BASIC METABOLIC PANEL
Anion gap: 13 (ref 5–15)
BUN: 11 mg/dL (ref 8–23)
CO2: 21 mmol/L — ABNORMAL LOW (ref 22–32)
Calcium: 9.2 mg/dL (ref 8.9–10.3)
Chloride: 102 mmol/L (ref 98–111)
Creatinine, Ser: 1.33 mg/dL — ABNORMAL HIGH (ref 0.61–1.24)
GFR calc Af Amer: >60 mL/min (ref >60)
GFR calc non Af Amer: 56 mL/min — ABNORMAL LOW (ref >60)
Glucose, Bld: 85 mg/dL (ref 70–99)
Potassium: 4.1 mmol/L (ref 3.5–5.1)
Sodium: 136 mmol/L (ref 135–145)

## 2020-06-04 LAB — CBC
HCT: 41.9 % (ref 39.0–52.0)
Hemoglobin: 13.4 g/dL (ref 13.0–17.0)
MCH: 30.3 pg (ref 26.0–34.0)
MCHC: 32 g/dL (ref 30.0–36.0)
MCV: 94.8 fL (ref 80.0–100.0)
Platelets: 259 10*3/uL (ref 150–400)
RBC: 4.42 MIL/uL (ref 4.22–5.81)
RDW: 12.2 % (ref 11.5–15.5)
WBC: 13.9 10*3/uL — ABNORMAL HIGH (ref 4.0–10.5)
nRBC: 0 % (ref 0.0–0.2)

## 2020-06-04 LAB — ACETAMINOPHEN LEVEL: Acetaminophen (Tylenol), Serum: <10 ug/mL — ABNORMAL LOW (ref 10–30)

## 2020-06-04 LAB — TROPONIN I (HIGH SENSITIVITY)
Troponin I (High Sensitivity): 76 ng/L — ABNORMAL HIGH (ref <18)
Troponin I (High Sensitivity): 61 ng/L — ABNORMAL HIGH (ref <18)

## 2020-06-04 LAB — RAPID URINE DRUG SCREEN, HOSP PERFORMED
Amphetamines: NOT DETECTED
Barbiturates: NOT DETECTED
Benzodiazepines: NOT DETECTED
Cocaine: POSITIVE — AB
Opiates: NOT DETECTED
Tetrahydrocannabinol: NOT DETECTED

## 2020-06-04 LAB — ETHANOL: Alcohol, Ethyl (B): <10 mg/dL (ref <10)

## 2020-06-04 LAB — SALICYLATE LEVEL: Salicylate Lvl: <7 mg/dL — ABNORMAL LOW (ref 7.0–30.0)

## 2020-06-04 MED ORDER — HEPARIN SODIUM (PORCINE) 5000 UNIT/ML IJ SOLN: 4000.0000 [IU] | Freq: Once | INTRAMUSCULAR | Status: DC

## 2020-06-04 MED ORDER — HEPARIN (PORCINE) 25000 UT/250ML-% IV SOLN
1400.0000 [IU]/h | INTRAVENOUS | Status: DC
  Administered 2020-06-05: 1400 [IU]/h via INTRAVENOUS
  Filled 2020-06-04: qty 250

## 2020-06-04 MED ORDER — SODIUM CHLORIDE 0.9% FLUSH
3.0000 mL | Freq: Once | INTRAVENOUS | Status: AC
  Administered 2020-06-04: 3 mL via INTRAVENOUS

## 2020-06-04 MED ORDER — HEPARIN (PORCINE) 25000 UT/250ML-% IV SOLN: 12.0000 [IU]/kg/h | INTRAVENOUS | Status: DC

## 2020-06-04 MED ORDER — HEPARIN BOLUS VIA INFUSION
4000.0000 [IU] | Freq: Once | INTRAVENOUS | Status: AC
  Administered 2020-06-05: 4000 [IU] via INTRAVENOUS
  Filled 2020-06-04: qty 4000

## 2020-06-04 MED ORDER — ALBUTEROL SULFATE HFA 108 (90 BASE) MCG/ACT IN AERS
1.0000 | INHALATION_SPRAY | Freq: Once | RESPIRATORY_TRACT | Status: AC
  Administered 2020-06-04: 2 via RESPIRATORY_TRACT
  Filled 2020-06-04: qty 6.7

## 2020-06-04 NOTE — ED Notes (Signed)
Pt states he has been dealing with mental issues since he was 64 years old. Pt states he currently doesn't have a plan right at this moment but has been unsuccessful in attempts in the past. Pt states he just wants some help and possibly medication changes. Pt states his current medications makes him sleep all day and he just wants something that will balance out his moods.

## 2020-06-04 NOTE — ED Provider Notes (Signed)
[facility] Provider Note   CSN: [id number] Arrival date & time: 06/04/20  1112     History Chief Complaint  Patient presents with  . Chest Pain  . Shortness of Breath  . Suicidal    Robert Chan is a 64 y.o. male past medical history of asthma, COPD, hypertension who presents for evaluation of chest pain, difficulty breathing, suicidal ideations.  He reports that yesterday, he started having some chest pain while he was "doing all kind of crazy stuff."  He states that he had some pressure on his chest as well as some cramping in the mid part of his chest. He said he also felt some heaviness, "like something sitting on him."  He states he did not get nausea/vomiting or diaphoretic with the pain.  He states that it is not worse with deep inspiration.  He has not really noticed that is worse with exertion.  He states it feels better now that he is come back here and is eating but he still gets some mild chest pain every now and then.  He is not taking any medication for the pain.  He also reports that he feels like he has been wheezing and had some trouble breathing.  He states that he normally uses inhalers.  He tried to use it earlier today and states that it was not really helping which is what prompted him to come to the emergency department.  He also reports that he has had suicidal ideations.  He states he has had mental issues since he was a child.  He states that he has had some worsening depression over the last week or so but he has been Keeping it all inside.  He states that it has added up and "just really getting to me."  He states that he has thought about different ways but has not had a specific thought of how he would do it.  He states he has tried in the past with pills and shooting himself.  He states he is also had some homicidal ideations against his daughter's boyfriend.  He states that "he is just getting off of me in the wrong way."  He  states he always hears voices and states that that has not changed.  He states that he feels paranoid and feels like people are coming after him.  He states when somebody starts walking after him, he gets concerned that they are following him "out to get him."  He smokes cigarettes.  He states he smokes about a pack of cigarettes a day.  He does use cocaine and reports his last cocaine use was a few days ago.  He denies any marijuana use.  He states he has never had any heart attack before.  He states that his sister has some cardiovascular issues but is unsure her exact diagnosis.  No family history of MI before the age of 71.  He has hypertension but no diabetes.  The history is provided by the patient.    HPI: A 64 year old patient with a history of hypertension and obesity presents for evaluation of chest pain. Initial onset of pain was more than 6 hours ago. The patient's chest pain is described as heaviness/pressure/tightness, is sharp and is not worse with exertion. The patient's chest pain is middle- or left-sided, is not well-localized and does not radiate to the arms/jaw/neck. The patient does not complain of nausea and denies diaphoresis. The patient has smoked in the past  90 days. The patient has no history of stroke, has no history of peripheral artery disease, denies any history of treated diabetes, has no relevant family history of coronary artery disease (first degree relative at less than age 62) and has no history of hypercholesterolemia.   Past Medical History:  Diagnosis Date  . Asthma   . Cataract   . COPD (chronic obstructive pulmonary disease) ([place])   . Hypertension     Patient Active Problem List   Diagnosis Date Noted  . Chest pain 06/04/2020  . Foot callus 12/12/2019  . Encounter for screening for COVID-19 12/12/2019  . Severe recurrent major depression without psychotic features ([place]) 09/24/2019  . Cocaine use disorder, mild, abuse ([place])   . Elevated serum creatinine  08/28/2019  . Moderate alcohol use disorder ([place]) 04/01/2018  . Age-related nuclear cataract of right eye 09/13/2016  . Benign essential hypertension 07/06/2016  . Glaucoma 07/06/2016  . Male erectile disorder 07/06/2016  . Asthma 10/18/2012  . COPD (chronic obstructive pulmonary disease) ([place]) 10/18/2012    Past Surgical History:  Procedure Laterality Date  . APPENDECTOMY    . EYE SURGERY    . PROSTATE SURGERY         Family History  Problem Relation Age of Onset  . Hypertension Mother   . Diabetes Mother   . Hypertension Father   . Hypertension Sister     Social History   Tobacco Use  . Smoking status: Current Some Day Smoker  . Smokeless tobacco: Never Used  Substance Use Topics  . Alcohol use: Yes  . Drug use: No    Home Medications Prior to Admission medications   Medication Sig Start Date End Date Taking? Authorizing Provider  albuterol (PROVENTIL) (5 MG/ML) 0.5% nebulizer solution Take 0.5 mLs (2.5 mg total) by nebulization every 6 (six) hours as needed for wheezing or shortness of breath. 05/02/20  Yes Charlesetta Shanks, MD  albuterol (VENTOLIN HFA) 108 (90 Base) MCG/ACT inhaler Inhale 1-2 puffs into the lungs every 6 (six) hours as needed for wheezing or shortness of breath. 05/22/20  Yes Mayers, Cari S, PA-C  amLODipine (NORVASC) 5 MG tablet Take 1 tablet (5 mg total) by mouth daily. 05/22/20  Yes Mayers, Cari S, PA-C  ARIPiprazole (ABILIFY) 2 MG tablet Take 1 tablet (2 mg total) by mouth daily. 05/22/20  Yes Mayers, Cari S, PA-C  mirtazapine (REMERON) 15 MG tablet Take 1 tablet (15 mg total) by mouth at bedtime. 05/22/20 08/20/20 Yes Mayers, Cari S, PA-C  SYMBICORT 160-4.5 MCG/ACT inhaler Inhale 2 puffs into the lungs in the morning and at bedtime. 05/22/20  Yes Mayers, Cari S, PA-C  predniSONE (DELTASONE) 20 MG tablet 3 tabs po day one, then 2 po daily x 4 days Patient not taking: Reported on 06/04/2020 05/02/20   Charlesetta Shanks, MD    Allergies    Shellfish  allergy  Review of Systems   Review of Systems  Constitutional: Negative for fever.  Respiratory: Positive for shortness of breath. Negative for cough.   Cardiovascular: Positive for chest pain.  Gastrointestinal: Negative for abdominal pain, nausea and vomiting.  Genitourinary: Negative for dysuria and hematuria.  Neurological: Negative for headaches.  Psychiatric/Behavioral: Positive for hallucinations and suicidal ideas.  All other systems reviewed and are negative.   Physical Exam Updated Vital Signs BP (!) 149/87   Pulse 73   Temp 98.9 F (37.2 C) (Oral)   Resp (!) 23   Ht 6\' 4"  (1.93 m)   Wt 113.4  kg   SpO2 99%   BMI 30.43 kg/m   Physical Exam Vitals and nursing note reviewed.  Constitutional:      Appearance: Normal appearance. He is well-developed.     Comments: Eating a sandwich, NAD   HENT:     Head: Normocephalic and atraumatic.  Eyes:     General: Lids are normal.     Conjunctiva/sclera: Conjunctivae normal.     Pupils: Pupils are equal, round, and reactive to light.  Cardiovascular:     Rate and Rhythm: Normal rate and regular rhythm.     Pulses: Normal pulses.          Radial pulses are 2+ on the right side and 2+ on the left side.       Dorsalis pedis pulses are 2+ on the right side and 2+ on the left side.     Heart sounds: Normal heart sounds. No murmur heard.  No friction rub. No gallop.   Pulmonary:     Effort: Pulmonary effort is normal.     Breath sounds: Wheezing present.     Comments: Mild wheezing noted. No evidence of respiratory  Abdominal:     Palpations: Abdomen is soft. Abdomen is not rigid.     Tenderness: There is no abdominal tenderness. There is no guarding.  Musculoskeletal:        General: Normal range of motion.     Cervical back: Full passive range of motion without pain.  Skin:    General: Skin is warm and dry.     Capillary Refill: Capillary refill takes less than 2 seconds.  Neurological:     Mental Status: He is alert  and oriented to person, place, and time.  Psychiatric:        Speech: Speech normal.     ED Results / Procedures / Treatments   Labs (all labs ordered are listed, but only abnormal results are displayed) Labs Reviewed  BASIC METABOLIC PANEL - Abnormal; Notable for the following components:      Result Value   CO2 21 (*)    Creatinine, Ser 1.33 (*)    GFR calc non Af Amer 56 (*)    All other components within normal limits  CBC - Abnormal; Notable for the following components:   WBC 13.9 (*)    All other components within normal limits  SALICYLATE LEVEL - Abnormal; Notable for the following components:   Salicylate Lvl <7.8 (*)    All other components within normal limits  ACETAMINOPHEN LEVEL - Abnormal; Notable for the following components:   Acetaminophen (Tylenol), Serum <10 (*)    All other components within normal limits  RAPID URINE DRUG SCREEN, HOSP PERFORMED - Abnormal; Notable for the following components:   Cocaine POSITIVE (*)    All other components within normal limits  TROPONIN I (HIGH SENSITIVITY) - Abnormal; Notable for the following components:   Troponin I (High Sensitivity) 76 (*)    All other components within normal limits  TROPONIN I (HIGH SENSITIVITY) - Abnormal; Notable for the following components:   Troponin I (High Sensitivity) 61 (*)    All other components within normal limits  SARS CORONAVIRUS 2 BY RT PCR (HOSPITAL ORDER, [facility])  ETHANOL  HEPARIN LEVEL (UNFRACTIONATED)    EKG EKG Interpretation  Date/Time:  Wednesday June 04 2020 20:58:45 EDT Ventricular Rate:  76 PR Interval:    QRS Duration: 83 QT Interval:  411 QTC Calculation: 463 R Axis:  89 Text Interpretation: Sinus rhythm Low voltage, extremity leads Nonspecific T abnrm, anterolateral leads subtle ST elevations in inferior lead similar to prior ecg on 05/02/2020 Confirmed by Madalyn Rob [phone]) on 06/04/2020 9:57:12 PM   Radiology DG Chest 2  View  Result Date: 06/04/2020 CLINICAL DATA:  Chest pain and shortness of breath EXAM: CHEST - 2 VIEW COMPARISON:  May 02, 2020 FINDINGS: Lungs are clear. Heart size and pulmonary vascularity are normal. No adenopathy. No pneumothorax. There is mild degenerative change in the thoracic spine. IMPRESSION: Lungs clear.  Cardiac silhouette within normal limits. Electronically Signed   By: Lowella Grip III M.D.   On: 06/04/2020 12:28    Procedures .Critical Care Performed by: Volanda Napoleon, PA-C Authorized by: Volanda Napoleon, PA-C   Critical care provider statement:    Critical care time (minutes):  35   Critical care was necessary to treat or prevent imminent or life-threatening deterioration of the following conditions: ACS.   Critical care was time spent personally by me on the following activities:  Discussions with consultants, evaluation of patient's response to treatment, examination of patient, ordering and performing treatments and interventions, ordering and review of laboratory studies, ordering and review of radiographic studies, pulse oximetry, re-evaluation of patient's condition, obtaining history from patient or surrogate and review of old charts   (including critical care time)  Medications Ordered in ED Medications  sodium chloride flush (NS) 0.9 % injection 3 mL (has no administration in time range)  heparin bolus via infusion 4,000 Units (has no administration in time range)  heparin ADULT infusion 100 units/mL (25000 units/299mL sodium chloride 0.45%) (has no administration in time range)  albuterol (VENTOLIN HFA) 108 (90 Base) MCG/ACT inhaler 1-2 puff (2 puffs Inhalation Given 06/04/20 2311)    ED Course  I have reviewed the triage vital signs and the nursing notes.  Pertinent labs & imaging results that were available during my care of the patient were reviewed by me and considered in my medical decision making (see chart for details).    MDM  Rules/Calculators/A&P HEAR Score: 28                        64 year old male with past history of hypertension who presents for evaluation of chest pain, shortness of breath that began yesterday.  Does endorse cocaine use within the last few days.  He cannot tell me exactly when he used cocaine.  He is a smoker.  States he had some heaviness in his chest as well as some cramping in the middle of his chest.  On initial arrival, he is afebrile, nontoxic-appearing.  He is slightly hypertensive.  Vitals otherwise stable.  On exam, he has some mild wheezing.  Labs ordered at triage.  Initial troponin is 76. Trop 1 month ago was 26 and 28.  Acetaminophen, salicylate level unremarkable.  BMP shows normal BUN.  Creatinine is 1.33.  CBC shows slight leukocytosis of 13.9.  Ethanol level is less than 10.  UDS is positive for cocaine.  Chest x-ray is unremarkable.  Given patient's history/risk factors, he has a heart score of 5.  We will plan for delta troponin.  Delta is 75  Discussed patient with Dr. Virgina Jock (Cardiology).  He recommends medical admission with plans for him to consult.  He states that he will plan to have him cath in the morning.  Recommend starting him on heparin.  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    Discussed patient with Dr. Marlowe Sax (hospitalist) who accepts patient for admission.   Updated patient on plan.  He is voluntary to stay at this time.   Portions of this note were generated with [employer]. Dictation errors may occur despite best attempts at proofreading.  Final Clinical Impression(s) / ED Diagnoses Final diagnoses:  Chest pain, unspecified type  Elevated troponin  Cocaine use  Suicidal ideation    Rx / DC Orders ED Discharge Orders    None       Desma Mcgregor 06/04/20 2323    Lucrezia Starch,  MD 06/05/20 0003

## 2020-06-04 NOTE — ED Triage Notes (Signed)
Pt c/o mid cp and sob since this morning. Pt denies any covid exposure, pt is vaccinated.

## 2020-06-04 NOTE — ED Notes (Signed)
Pt expressing some SI right before take him to waiting room.

## 2020-06-04 NOTE — Progress Notes (Signed)
ANTICOAGULATION CONSULT NOTE - Initial Consult  Pharmacy Consult for heparin Indication: chest pain/ACS  Allergies  Allergen Reactions  . Shellfish Allergy Hives and Rash    Patient Measurements: Height: 6\' 4"  (193 cm) Weight: 113.4 kg (250 lb) IBW/kg (Calculated) : 86.8 Heparin Dosing Weight: 110kg  Vital Signs: Temp: 98.9 F (37.2 C) (08/04 1500) Temp Source: Oral (08/04 1500) BP: 149/87 (08/04 2215) Pulse Rate: 73 (08/04 2215)  Labs: Recent Labs    06/04/20 1201 06/04/20 2030  HGB 13.4  --   HCT 41.9  --   PLT 259  --   CREATININE 1.33*  --   TROPONINIHS 76* 61*    Estimated Creatinine Clearance: 76.3 mL/min (A) (by C-G formula based on SCr of 1.33 mg/dL (H)).   Medical History: Past Medical History:  Diagnosis Date  . Asthma   . Cataract   . COPD (chronic obstructive pulmonary disease) ([place])   . Hypertension     Assessment: 64yo male c/o CP/SOB since this am as well as SI, troponin mildly elevated, to begin heparin.  Goal of Therapy:  Heparin level 0.3-0.7 units/ml Monitor platelets by anticoagulation protocol: Yes   Plan:  Will give heparin 4000 units IV bolus x1 followed by gtt at 1400 units/hr and monitor heparin levels and CBC.  Wynona Neat, PharmD, BCPS  06/04/2020,11:12 PM

## 2020-06-05 ENCOUNTER — Encounter: Payer: Self-pay | Admitting: Internal Medicine

## 2020-06-05 ENCOUNTER — Encounter (HOSPITAL_COMMUNITY)
Admission: EM | Disposition: A | Discharge status: Home / Self Care | Payer: Self-pay | Source: Home / Self Care | Attending: Emergency Medicine

## 2020-06-05 ENCOUNTER — Encounter (HOSPITAL_COMMUNITY): Payer: Self-pay | Admitting: Internal Medicine

## 2020-06-05 DIAGNOSIS — R45851 Suicidal ideations: Secondary | ICD-10-CM

## 2020-06-05 DIAGNOSIS — Z720 Tobacco use: Secondary | ICD-10-CM

## 2020-06-05 DIAGNOSIS — R079 Chest pain, unspecified: Secondary | ICD-10-CM

## 2020-06-05 DIAGNOSIS — I252 Old myocardial infarction: Secondary | ICD-10-CM

## 2020-06-05 HISTORY — DX: Suicidal ideations: R45.851

## 2020-06-05 HISTORY — PX: LEFT HEART CATH AND CORONARY ANGIOGRAPHY: CATH118249

## 2020-06-05 LAB — BASIC METABOLIC PANEL
Anion gap: 11 (ref 5–15)
BUN: 14 mg/dL (ref 8–23)
CO2: 23 mmol/L (ref 22–32)
Calcium: 8.7 mg/dL — ABNORMAL LOW (ref 8.9–10.3)
Chloride: 104 mmol/L (ref 98–111)
Creatinine, Ser: 1.36 mg/dL — ABNORMAL HIGH (ref 0.61–1.24)
GFR calc Af Amer: >60 mL/min (ref >60)
GFR calc non Af Amer: 54 mL/min — ABNORMAL LOW (ref >60)
Glucose, Bld: 87 mg/dL (ref 70–99)
Potassium: 4.2 mmol/L (ref 3.5–5.1)
Sodium: 138 mmol/L (ref 135–145)

## 2020-06-05 LAB — MAGNESIUM: Magnesium: 2.1 mg/dL (ref 1.7–2.4)

## 2020-06-05 LAB — SARS CORONAVIRUS 2 BY RT PCR (HOSPITAL ORDER, PERFORMED IN ~~LOC~~ HOSPITAL LAB): SARS Coronavirus 2: NEGATIVE

## 2020-06-05 LAB — PHOSPHORUS: Phosphorus: 4.2 mg/dL (ref 2.5–4.6)

## 2020-06-05 LAB — HIV ANTIBODY (ROUTINE TESTING W REFLEX): HIV Screen 4th Generation wRfx: NONREACTIVE

## 2020-06-05 SURGERY — LEFT HEART CATH AND CORONARY ANGIOGRAPHY
Anesthesia: LOCAL

## 2020-06-05 MED ORDER — MIRTAZAPINE 15 MG PO TABS
15.0000 mg | ORAL_TABLET | Freq: Every day | ORAL | Status: DC
  Filled 2020-06-05: qty 1

## 2020-06-05 MED ORDER — ADULT MULTIVITAMIN W/MINERALS CH
1.0000 | ORAL_TABLET | Freq: Every day | ORAL | Status: DC
  Administered 2020-06-05: 1 via ORAL
  Filled 2020-06-05: qty 1

## 2020-06-05 MED ORDER — NITROGLYCERIN 1 MG/10 ML FOR IR/CATH LAB
INTRA_ARTERIAL | Status: DC | PRN
  Administered 2020-06-05: 200 ug via INTRACORONARY

## 2020-06-05 MED ORDER — THIAMINE HCL 100 MG/ML IJ SOLN: 100.0000 mg | Freq: Every day | INTRAMUSCULAR | Status: DC

## 2020-06-05 MED ORDER — HEPARIN SODIUM (PORCINE) 1000 UNIT/ML IJ SOLN
INTRAMUSCULAR | Status: DC | PRN
  Administered 2020-06-05: 6000 [IU] via INTRAVENOUS

## 2020-06-05 MED ORDER — HEPARIN (PORCINE) IN NACL 1000-0.9 UT/500ML-% IV SOLN
INTRAVENOUS | Status: AC
  Filled 2020-06-05: qty 1000

## 2020-06-05 MED ORDER — HEPARIN (PORCINE) IN NACL 1000-0.9 UT/500ML-% IV SOLN
INTRAVENOUS | Status: DC | PRN
  Administered 2020-06-05 (×2): 500 mL

## 2020-06-05 MED ORDER — SODIUM CHLORIDE 0.9% FLUSH: 3.0000 mL | INTRAVENOUS | Status: DC | PRN

## 2020-06-05 MED ORDER — THIAMINE HCL 100 MG PO TABS: 100.0000 mg | ORAL_TABLET | Freq: Every day | ORAL | 1 refills | Status: DC

## 2020-06-05 MED ORDER — SODIUM CHLORIDE 0.9 % WEIGHT BASED INFUSION: 1.0000 mL/kg/h | INTRAVENOUS | Status: DC

## 2020-06-05 MED ORDER — SODIUM CHLORIDE 0.9 % IV SOLN: 250.0000 mL | INTRAVENOUS | Status: DC | PRN

## 2020-06-05 MED ORDER — FENTANYL CITRATE (PF) 100 MCG/2ML IJ SOLN
INTRAMUSCULAR | Status: DC | PRN
  Administered 2020-06-05: 25 ug via INTRAVENOUS

## 2020-06-05 MED ORDER — NITROGLYCERIN 1 MG/10 ML FOR IR/CATH LAB
INTRA_ARTERIAL | Status: AC
  Filled 2020-06-05: qty 10

## 2020-06-05 MED ORDER — HEPARIN SODIUM (PORCINE) 1000 UNIT/ML IJ SOLN
INTRAMUSCULAR | Status: AC
  Filled 2020-06-05: qty 1

## 2020-06-05 MED ORDER — ACETAMINOPHEN 325 MG PO TABS: 650.0000 mg | ORAL_TABLET | ORAL | Status: DC | PRN

## 2020-06-05 MED ORDER — NICOTINE 7 MG/24HR TD PT24: 7.0000 mg | MEDICATED_PATCH | Freq: Every day | TRANSDERMAL | 0 refills | Status: DC

## 2020-06-05 MED ORDER — NICOTINE 7 MG/24HR TD PT24
7.0000 mg | MEDICATED_PATCH | Freq: Every day | TRANSDERMAL | Status: DC
  Filled 2020-06-05: qty 1

## 2020-06-05 MED ORDER — FENTANYL CITRATE (PF) 100 MCG/2ML IJ SOLN
INTRAMUSCULAR | Status: AC
  Filled 2020-06-05: qty 2

## 2020-06-05 MED ORDER — SODIUM CHLORIDE 0.9 % IV SOLN: INTRAVENOUS | Status: AC

## 2020-06-05 MED ORDER — LORAZEPAM 1 MG PO TABS: 1.0000 mg | ORAL_TABLET | ORAL | Status: DC | PRN

## 2020-06-05 MED ORDER — SODIUM CHLORIDE 0.9% FLUSH
3.0000 mL | Freq: Two times a day (BID) | INTRAVENOUS | Status: DC
  Administered 2020-06-05: 3 mL via INTRAVENOUS

## 2020-06-05 MED ORDER — AMLODIPINE BESYLATE 5 MG PO TABS
5.0000 mg | ORAL_TABLET | Freq: Every day | ORAL | Status: DC
  Administered 2020-06-05: 5 mg via ORAL
  Filled 2020-06-05: qty 1

## 2020-06-05 MED ORDER — MIDAZOLAM HCL 2 MG/2ML IJ SOLN
INTRAMUSCULAR | Status: AC
  Filled 2020-06-05: qty 2

## 2020-06-05 MED ORDER — LIDOCAINE HCL (PF) 1 % IJ SOLN
INTRAMUSCULAR | Status: DC | PRN
  Administered 2020-06-05: 3 mL

## 2020-06-05 MED ORDER — FOLIC ACID 1 MG PO TABS
1.0000 mg | ORAL_TABLET | Freq: Every day | ORAL | Status: DC
  Administered 2020-06-05: 1 mg via ORAL
  Filled 2020-06-05: qty 1

## 2020-06-05 MED ORDER — LIDOCAINE HCL (PF) 1 % IJ SOLN
INTRAMUSCULAR | Status: AC
  Filled 2020-06-05: qty 30

## 2020-06-05 MED ORDER — ASPIRIN 81 MG PO CHEW
324.0000 mg | CHEWABLE_TABLET | Freq: Once | ORAL | Status: AC
  Administered 2020-06-05: 324 mg via ORAL
  Filled 2020-06-05: qty 4

## 2020-06-05 MED ORDER — ARIPIPRAZOLE 5 MG PO TABS: 5.0000 mg | ORAL_TABLET | Freq: Every day | ORAL | 0 refills | Status: DC

## 2020-06-05 MED ORDER — NITROGLYCERIN 0.4 MG SL SUBL: 0.4000 mg | SUBLINGUAL_TABLET | SUBLINGUAL | Status: DC | PRN

## 2020-06-05 MED ORDER — THIAMINE HCL 100 MG PO TABS
100.0000 mg | ORAL_TABLET | Freq: Every day | ORAL | Status: DC
  Administered 2020-06-05: 100 mg via ORAL
  Filled 2020-06-05: qty 1

## 2020-06-05 MED ORDER — ALBUTEROL SULFATE (2.5 MG/3ML) 0.083% IN NEBU: 2.5000 mg | INHALATION_SOLUTION | Freq: Four times a day (QID) | RESPIRATORY_TRACT | Status: DC | PRN

## 2020-06-05 MED ORDER — MOMETASONE FURO-FORMOTEROL FUM 200-5 MCG/ACT IN AERO
2.0000 | INHALATION_SPRAY | Freq: Two times a day (BID) | RESPIRATORY_TRACT | Status: DC
  Filled 2020-06-05 (×2): qty 8.8

## 2020-06-05 MED ORDER — MIDAZOLAM HCL 2 MG/2ML IJ SOLN
INTRAMUSCULAR | Status: DC | PRN
  Administered 2020-06-05: 1 mg via INTRAVENOUS

## 2020-06-05 MED ORDER — ONDANSETRON HCL 4 MG/2ML IJ SOLN: 4.0000 mg | Freq: Four times a day (QID) | INTRAMUSCULAR | Status: DC | PRN

## 2020-06-05 MED ORDER — IOHEXOL 350 MG/ML SOLN
INTRAVENOUS | Status: DC | PRN
  Administered 2020-06-05: 55 mL

## 2020-06-05 MED ORDER — LABETALOL HCL 5 MG/ML IV SOLN: 10.0000 mg | INTRAVENOUS | Status: DC | PRN

## 2020-06-05 MED ORDER — SODIUM CHLORIDE 0.9% FLUSH: 3.0000 mL | Freq: Two times a day (BID) | INTRAVENOUS | Status: DC

## 2020-06-05 MED ORDER — FOLIC ACID 1 MG PO TABS: 1.0000 mg | ORAL_TABLET | Freq: Every day | ORAL | 1 refills | Status: DC

## 2020-06-05 MED ORDER — HYDRALAZINE HCL 20 MG/ML IJ SOLN: 10.0000 mg | INTRAMUSCULAR | Status: DC | PRN

## 2020-06-05 MED ORDER — SODIUM CHLORIDE 0.9 % WEIGHT BASED INFUSION
3.0000 mL/kg/h | INTRAVENOUS | Status: AC
  Administered 2020-06-05: 3 mL/kg/h via INTRAVENOUS

## 2020-06-05 MED ORDER — VERAPAMIL HCL 2.5 MG/ML IV SOLN
INTRAVENOUS | Status: AC
  Filled 2020-06-05: qty 2

## 2020-06-05 MED ORDER — LORAZEPAM 2 MG/ML IJ SOLN: 1.0000 mg | INTRAMUSCULAR | Status: DC | PRN

## 2020-06-05 MED ORDER — VERAPAMIL HCL 2.5 MG/ML IV SOLN
INTRAVENOUS | Status: DC | PRN
  Administered 2020-06-05: 10 mL via INTRA_ARTERIAL

## 2020-06-05 SURGICAL SUPPLY — 11 items
CATH INFINITI 5 FR JL3.5 (CATHETERS) ×1 IMPLANT
CATH INFINITI 5F JL4 125CM (CATHETERS) ×1 IMPLANT
CATH INFINITI 5FR JR4 125CM (CATHETERS) ×1 IMPLANT
DEVICE RAD COMP TR BAND LRG (VASCULAR PRODUCTS) ×1 IMPLANT
GLIDESHEATH SLEND A-KIT 6F 22G (SHEATH) ×1 IMPLANT
GUIDEWIRE INQWIRE 1.5J.035X260 (WIRE) IMPLANT
INQWIRE 1.5J .035X260CM (WIRE) ×2
KIT HEART LEFT (KITS) ×2 IMPLANT
PACK CARDIAC CATHETERIZATION (CUSTOM PROCEDURE TRAY) ×2 IMPLANT
TRANSDUCER W/STOPCOCK (MISCELLANEOUS) ×2 IMPLANT
TUBING CIL FLEX 10 FLL-RA (TUBING) ×2 IMPLANT

## 2020-06-05 NOTE — Consult Note (Signed)
CARDIOLOGY CONSULT NOTE  Patient ID: Robert Chan MRN: [id number] DOB/AGE: [birth year] 66 y.o.  Admit date: 06/04/2020 Referring Physician: Shelly Coss, MD Primary Physician:  Arman Bogus., MD Reason for Consultation: Chest pain  HPI:  Robert Chan is a 64 y.o. male who presents with a chief complaint of " chest pain." He past medical history and cardiovascular risk factors include: Asthma, COPD, history of left lower extremity DVT, hypertension, history of congestive heart failure per patient, active use of tobacco/alcohol/cocaine.  Patient presents to the hospital with a chief complaint of chest pain, shortness of breath, and suicidal ideation.  Cardiology was consulted for evaluation and management of chest pain.  Patient states that he was having chest discomfort starting Monday which was approximately 2 days ago after working out.  The pain is located substernally, worse with walking, minimal improvement with resting.  Patient thought it was secondary to possibly asthma or underlying COPD.  Patient states that he drink a couple glasses of water sat down, and also used his inhalers.  But the pressure-like sensation continued.  The pressure-like sensation was located substernally 10 out of 10 and at the time of ER presentation it was 8 out of 10 per patient.  The pain is nonradiating.  His last use of cocaine was Monday, June 02, 2020.  Patient states that his discomfort that he may have experienced in the past due to cocaine is located to the left of his sternum.  But this is different discomfort located substernally.  Patient states that he did undergo left heart catheterization in [place] approximately 5 years ago and was told that he has some disease but nothing significant for coronary intervention.  I do not have that report or films to review.  EKG 06/04/2020: Normal sinus, 76 bpm, low voltage in the limb leads, very subtle ST changes in inferior leads, T wave inversions in  anteroseptal leads.  High sensitive troponin peaked at 76.  Now downtrending.  Patient still has a degree of chest discomfort which he describes as pressure-like sensation.  ALLERGIES: Allergies  Allergen Reactions  . Shellfish Allergy Hives and Rash   PAST MEDICAL HISTORY: Past Medical History:  Diagnosis Date  . Asthma   . Cataract   . COPD (chronic obstructive pulmonary disease) ([place])   . Hypertension   History of left lower extremity DVT, per patient. History of congestive heart failure, per patient  PAST SURGICAL HISTORY: Past Surgical History:  Procedure Laterality Date  . APPENDECTOMY    . EYE SURGERY    . PROSTATE SURGERY    History of left heart catheterization approximately 5 years ago in [place]: The patient family history includes Diabetes in his mother; Hypertension in his father, mother, and sister.   SOCIAL HISTORY:  The patient  reports that he has been smoking. He has never used smokeless tobacco. He reports current alcohol use. He reports that he does not use drugs.  Smokes approximately 4 to 5 cigarettes/day, alcohol daily up to (2) 40 ounce beers, cocaine use.  MEDICATIONS: Medications Prior to Admission  Medication Sig Dispense Refill Last Dose  . albuterol (PROVENTIL) (5 MG/ML) 0.5% nebulizer solution Take 0.5 mLs (2.5 mg total) by nebulization every 6 (six) hours as needed for wheezing or shortness of breath. 20 mL 12 06/04/2020 at Unknown time  . albuterol (VENTOLIN HFA) 108 (90 Base) MCG/ACT inhaler Inhale 1-2 puffs into the lungs every 6 (six) hours as needed for wheezing or shortness of breath. 90 g  2 Unknown at Unknown  . amLODipine (NORVASC) 5 MG tablet Take 1 tablet (5 mg total) by mouth daily. 30 tablet 1 Past Week at Unknown time  . ARIPiprazole (ABILIFY) 2 MG tablet Take 1 tablet (2 mg total) by mouth daily. 30 tablet 0 Past Week at Unknown time  . mirtazapine (REMERON) 15 MG tablet Take 1 tablet (15 mg total) by mouth at  bedtime. 30 tablet 2 Past Week at Unknown time  . SYMBICORT 160-4.5 MCG/ACT inhaler Inhale 2 puffs into the lungs in the morning and at bedtime. 1 Inhaler 3 06/04/2020 at Unknown time  . predniSONE (DELTASONE) 20 MG tablet 3 tabs po day one, then 2 po daily x 4 days (Patient not taking: Reported on 06/04/2020) 11 tablet 0 Not Taking at Unknown time     Current Outpatient Medications  Medication Instructions  . albuterol (PROVENTIL) 2.5 mg, Nebulization, Every 6 hours PRN  . albuterol (VENTOLIN HFA) 108 (90 Base) MCG/ACT inhaler 1-2 puffs, Inhalation, Every 6 hours PRN  . amLODipine (NORVASC) 5 mg, Oral, Daily  . ARIPiprazole (ABILIFY) 2 mg, Oral, Daily  . mirtazapine (REMERON) 15 mg, Oral, Daily at bedtime  . predniSONE (DELTASONE) 20 MG tablet 3 tabs po day one, then 2 po daily x 4 days  . SYMBICORT 160-4.5 MCG/ACT inhaler 2 puffs, Inhalation, 2 times daily    Review of Systems  Constitutional: Negative for chills and fever.  HENT: Negative for hoarse voice and nosebleeds.   Eyes: Negative for discharge, double vision and pain.  Cardiovascular: Positive for chest pain. Negative for claudication, dyspnea on exertion, leg swelling, near-syncope, orthopnea, palpitations, paroxysmal nocturnal dyspnea and syncope.  Respiratory: Positive for shortness of breath. Negative for hemoptysis.   Musculoskeletal: Negative for muscle cramps and myalgias.  Gastrointestinal: Negative for abdominal pain, constipation, diarrhea, hematemesis, hematochezia, melena, nausea and vomiting.  Neurological: Negative for dizziness and light-headedness.  All other systems reviewed and are negative.  PHYSICAL EXAM: Vitals with BMI 06/05/2020 06/05/2020 06/05/2020  Height - - -  Weight - - -  BMI - - -  Systolic [phone]  Diastolic 87 80 78  Pulse 65 69 67  Some encounter information is confidential and restricted. Go to Review Flowsheets activity to see all data.    No intake or output data in the 24 hours ending  06/05/20 0600  Net IO Since Admission: No IO data has been entered for this period [06/05/20 0922] CONSTITUTIONAL: Well-developed and well-nourished. No acute distress.  SKIN: Skin is warm and dry. No rash noted. No cyanosis. No pallor. No jaundice HEAD: Normocephalic and atraumatic.  EYES: No scleral icterus MOUTH/THROAT: Moist oral membranes.  NECK: No JVD present. No thyromegaly noted. No carotid bruits  LYMPHATIC: No visible cervical adenopathy.  CHEST Normal respiratory effort. No intercostal retractions  LUNGS: Clear to auscultation bilaterally.  No stridor. No wheezes. No rales.  CARDIOVASCULAR: Regular, positive S1-S2, no murmurs rubs or appreciated. ABDOMINAL: No apparent ascites.  EXTREMITIES: Warm to touch bilaterally, trace bilateral pitting edema at HEMATOLOGIC: No significant bruising NEUROLOGIC: Oriented to person, place, and time. Nonfocal. Normal muscle tone.  PSYCHIATRIC: Normal mood and affect. Normal behavior. Cooperative  RADIOLOGY: DG Chest 2 View  Result Date: 06/04/2020 CLINICAL DATA:  Chest pain and shortness of breath EXAM: CHEST - 2 VIEW COMPARISON:  May 02, 2020 FINDINGS: Lungs are clear. Heart size and pulmonary vascularity are normal. No adenopathy. No pneumothorax. There is mild degenerative change in the thoracic spine. IMPRESSION: Lungs clear.  Cardiac silhouette within normal limits. Electronically Signed   By: Lowella Grip III M.D.   On: 06/04/2020 12:28    LABORATORY DATA: Lab Results  Component Value Date   WBC 13.9 (H) 06/04/2020   HGB 13.4 06/04/2020   HCT 41.9 06/04/2020   MCV 94.8 06/04/2020   PLT 259 06/04/2020    Recent Labs  Lab 06/04/20 1201  NA 136  K 4.1  CL 102  CO2 21*  BUN 11  CREATININE 1.33*  CALCIUM 9.2  GLUCOSE 85    Lipid Panel     Component Value Date/Time   CHOL 187 09/25/2019 0628   TRIG 69 09/25/2019 0628   HDL 59 09/25/2019 0628   CHOLHDL 3.2 09/25/2019 0628   VLDL 14 09/25/2019 0628   LDLCALC 114  (H) 09/25/2019 0628    BNP (last 3 results) Recent Labs    05/02/20 0658  BNP 45.8    HEMOGLOBIN A1C Lab Results  Component Value Date   HGBA1C 5.1 09/25/2019   MPG 99.67 09/25/2019   TSH Recent Labs    09/25/19 0628 02/28/20 1014  TSH 2.103 2.020     Scheduled Meds: . amLODipine  5 mg Oral Daily  . folic acid  1 mg Oral Daily  . mometasone-formoterol  2 puff Inhalation BID  . multivitamin with minerals  1 tablet Oral Daily  . nicotine  7 mg Transdermal Daily  . thiamine  100 mg Oral Daily   Or  . thiamine  100 mg Intravenous Daily   Continuous Infusions: PRN Meds:.acetaminophen, albuterol, LORazepam **OR** LORazepam, nitroGLYCERIN  CARDIAC DATABASE: EKG: EKG 06/04/2020: Normal sinus, 76 bpm, low voltage in the limb leads, very subtle ST changes in inferior leads, T wave inversions in anteroseptal leads.  Echocardiogram: None  IMPRESSION & RECOMMENDATIONS:  Robert Chan is a 64 y.o. male whose past medical history and cardiovascular risk factors include: Asthma, COPD, history of left lower extremity DVT, hypertension, history of congestive heart failure per patient, active use of tobacco/alcohol/cocaine.  Non-STEMI:  Patient presents with symptoms of chest pain which is typical in nature, new T wave inversions in the anteroseptal leads, mild elevation in troponins.  Patient also states that he had a cath about 5 years ago and was told to have nonobstructive disease without prior coronary interventions.  Recent episode could be secondary to progression of CAD or vasospastic response to recent cocaine use.  Recommend continuing heparin  Avoid beta-blockers given the recent use of cocaine.  Echocardiogram will be ordered to evaluate for structural heart disease and left ventricular systolic function.  Aspirin 325 mg p.o. x1 if not already given.  Check fasting lipid profile.  Check BNP  Discussed undergoing left heart catheterization.  Patient is  agreeable.  The left heart catheterization procedure was explained to the patient in detail. The indication, alternatives, risks and benefits were reviewed. Complications including but not limited to bleeding, infection, acute kidney injury, blood transfusion, heart rhythm disturbances, contrast (dye) reaction, damage to the arteries or nerves in the legs or hands, cerebrovascular accident, myocardial infarction, need for emergent bypass surgery, blood clots in the legs, possible need for emergent blood transfusion, and rarely death were reviewed and discussed with the patient. The patient voices understanding and wishes to proceed.   Patient is allergic to shellfish recommend steroids prior to the left heart catheterization.  N.p.o. for now.  Further recommendations to follow.  Benign essential hypertension: Management per primary team.  Tobacco use: Educated on importance of complete  smoking cessation.  Cocaine use: Educated on importance of complete cocaine cessation.  Continue your care regarding his chronic comorbid conditions.  Patient's questions and concerns were addressed to his satisfaction. He voices understanding of the instructions provided during this encounter.   This note was created using a voice recognition software as a result there may be grammatical errors inadvertently enclosed that do not reflect the nature of this encounter. Every attempt is made to correct such errors.  Rex Kras, DO, [facility] Office: [phone] 06/05/2020, 9:22 AM

## 2020-06-05 NOTE — Care Management Obs Status (Signed)
[facility] NOTIFICATION   Patient Details  Name: Robert Chan MRN: [id number] Date of Birth: November 17, 1955   Medicare Observation Status Notification Given:  Yes    Ninfa Meeker, RN 06/05/2020, 2:03 PM

## 2020-06-05 NOTE — Care Management CC44 (Signed)
Condition Code 44 Documentation Completed  Patient Details  Name: Robert Chan MRN: [id number] Date of Birth: Oct 24, 1956   Condition Code 44 given:  Yes Patient signature on Condition Code 44 notice:  Yes Documentation of 2 MD's agreement:  Yes Code 44 added to claim:  Yes    Ninfa Meeker, RN 06/05/2020, 2:03 PM

## 2020-06-05 NOTE — H&P (Signed)
History and Physical    Robert Chan FVC:[id number] DOB: 02-10-1956 DOA: 06/04/2020  PCP: Arman Bogus., MD Patient coming from: Home  Chief Complaint: Chest pain, shortness of breath, suicidal ideation  HPI: Robert Chan is a 64 y.o. male with medical history significant of asthma, COPD, hypertension, tobacco use presenting with complaints of chest pain, shortness of breath, and suicidal ideation.  Patient reports having substernal chest pressure and shortness of breath with exercise for the past few days.  No associated dyspnea or diaphoresis.  Symptoms have been getting progressively worse which made him come into the ED today to be evaluated.  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  He continues to feel depressed despite taking his medications and they make him feel sleepy.  States he owns a gun and his plan is to shoot himself.  States he has tried to kill himself multiple times in the past by shooting himself and overdosing on pills.  States 3 of his brothers and his nephew also committed suicide.  He has been using cocaine and thinks he last used it 2 days ago.  He drinks 3-4 40 oz beers every day, last drink a day ago.  He smokes 0.25 packs of cigarettes daily.  He has been vaccinated against Covid.  ED Course: Afebrile.  Slightly tachycardic on arrival with heart rate 104.  Slightly hypertensive with systolic in the 638G.  Not tachypneic or hypoxic.  WBC count mildly elevated at 13.9 and stable compared to labs done a month ago.  Creatinine 1.3, at baseline.  High-sensitivity troponin 76 > 61.  EKG showing new T wave inversions in V2-V3.  Blood ethanol level undetectable.  Salicylate level undetectable.  Acetaminophen level undetectable.  UDS positive for cocaine.  SARS-CoV-2 PCR test pending.  Chest x-ray negative for active cardiopulmonary disease.  ED provider discussed the case with Dr. Virgina Jock from cardiology who will consult.  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  Recommended starting him on heparin.  IV heparin started in the ED.  Review of Systems:  All systems reviewed and apart from history of presenting illness, are negative.  Past Medical History:  Diagnosis Date  . Asthma   . Cataract   . COPD (chronic obstructive pulmonary disease) ([place])   . Hypertension     Past Surgical History:  Procedure Laterality Date  . APPENDECTOMY    . EYE SURGERY    . PROSTATE SURGERY       reports that he has been smoking. He has never used smokeless tobacco. He reports current alcohol use. He reports that he does not use drugs.  Allergies  Allergen Reactions  . Shellfish Allergy Hives and Rash    Family History  Problem Relation Age of Onset  . Hypertension Mother   . Diabetes Mother   . Hypertension Father   . Hypertension Sister     Prior to Admission medications   Medication Sig Start Date End Date Taking? Authorizing Provider  albuterol (PROVENTIL) (5 MG/ML) 0.5% nebulizer solution Take 0.5 mLs (2.5 mg total) by nebulization every 6 (six) hours as needed for wheezing or shortness of breath. 05/02/20  Yes Charlesetta Shanks, MD  albuterol (VENTOLIN HFA) 108 (90 Base) MCG/ACT inhaler Inhale 1-2 puffs into the lungs every 6 (six) hours as needed for wheezing or shortness  of breath. 05/22/20  Yes Mayers, Cari S, PA-C  amLODipine (NORVASC) 5 MG tablet Take 1 tablet (5 mg total) by mouth daily. 05/22/20  Yes Mayers, Cari S, PA-C  ARIPiprazole (ABILIFY) 2 MG tablet Take 1 tablet (2 mg total) by mouth daily. 05/22/20  Yes Mayers, Cari S, PA-C  mirtazapine (REMERON) 15 MG tablet Take 1 tablet (15 mg total) by mouth at bedtime. 05/22/20 08/20/20 Yes Mayers, Cari S, PA-C  SYMBICORT 160-4.5 MCG/ACT inhaler Inhale 2 puffs into the lungs in the morning and at bedtime. 05/22/20  Yes Mayers, Cari S,  PA-C  predniSONE (DELTASONE) 20 MG tablet 3 tabs po day one, then 2 po daily x 4 days Patient not taking: Reported on 06/04/2020 05/02/20   Charlesetta Shanks, MD    Physical Exam: Vitals:   06/04/20 2145 06/04/20 2215 06/04/20 2311 06/04/20 2331  BP: (!) 142/86 (!) 149/87  128/81  Pulse: 80 73  76  Resp: 16 (!) 23  17  Temp:      TempSrc:      SpO2: 95% 98% 99% 98%  Weight:      Height:        Physical Exam Constitutional:      General: He is not in acute distress. HENT:     Head: Normocephalic and atraumatic.     Mouth/Throat:     Mouth: Mucous membranes are moist.     Pharynx: Oropharynx is clear.  Eyes:     Extraocular Movements: Extraocular movements intact.     Conjunctiva/sclera: Conjunctivae normal.  Cardiovascular:     Rate and Rhythm: Normal rate and regular rhythm.     Pulses: Normal pulses.  Pulmonary:     Effort: Pulmonary effort is normal. No respiratory distress.     Breath sounds: Normal breath sounds. No wheezing or rales.  Abdominal:     General: Bowel sounds are normal. There is no distension.     Palpations: Abdomen is soft.     Tenderness: There is no abdominal tenderness. There is no guarding.  Musculoskeletal:        General: No swelling.     Cervical back: Normal range of motion and neck supple.  Skin:    General: Skin is warm and dry.  Neurological:     General: No focal deficit present.     Mental Status: He is alert and oriented to person, place, and time.     Labs on Admission: I have personally reviewed following labs and imaging studies  CBC: Recent Labs  Lab 06/04/20 1201  WBC 13.9*  HGB 13.4  HCT 41.9  MCV 94.8  PLT 824   Basic Metabolic Panel: Recent Labs  Lab 06/04/20 1201  NA 136  K 4.1  CL 102  CO2 21*  GLUCOSE 85  BUN 11  CREATININE 1.33*  CALCIUM 9.2   GFR: Estimated Creatinine Clearance: 76.3 mL/min (A) (by C-G formula based on SCr of 1.33 mg/dL (H)). Liver Function Tests: No results for input(s): AST, ALT,  ALKPHOS, BILITOT, PROT, ALBUMIN in the last 168 hours. No results for input(s): LIPASE, AMYLASE in the last 168 hours. No results for input(s): AMMONIA in the last 168 hours. Coagulation Profile: No results for input(s): INR, PROTIME in the last 168 hours. Cardiac Enzymes: No results for input(s): CKTOTAL, CKMB, CKMBINDEX, TROPONINI in the last 168 hours. BNP (last 3 results) No results for input(s): PROBNP in the last 8760 hours. HbA1C: No results for input(s): HGBA1C in the last 72 hours. CBG:  No results for input(s): GLUCAP in the last 168 hours. Lipid Profile: No results for input(s): CHOL, HDL, LDLCALC, TRIG, CHOLHDL, LDLDIRECT in the last 72 hours. Thyroid Function Tests: No results for input(s): TSH, T4TOTAL, FREET4, T3FREE, THYROIDAB in the last 72 hours. Anemia Panel: No results for input(s): VITAMINB12, FOLATE, FERRITIN, TIBC, IRON, RETICCTPCT in the last 72 hours. Urine analysis:    Component Value Date/Time   COLORURINE YELLOW 01/09/2017 1126   APPEARANCEUR CLEAR 01/09/2017 1126   LABSPEC 1.025 01/09/2017 1126   PHURINE 5.0 01/09/2017 1126   GLUCOSEU NEGATIVE 01/09/2017 1126   HGBUR SMALL (A) 01/09/2017 1126   BILIRUBINUR NEGATIVE 01/09/2017 Kenneth 01/09/2017 1126   PROTEINUR NEGATIVE 01/09/2017 1126   NITRITE NEGATIVE 01/09/2017 1126   LEUKOCYTESUR NEGATIVE 01/09/2017 1126    Radiological Exams on Admission: DG Chest 2 View  Result Date: 06/04/2020 CLINICAL DATA:  Chest pain and shortness of breath EXAM: CHEST - 2 VIEW COMPARISON:  May 02, 2020 FINDINGS: Lungs are clear. Heart size and pulmonary vascularity are normal. No adenopathy. No pneumothorax. There is mild degenerative change in the thoracic spine. IMPRESSION: Lungs clear.  Cardiac silhouette within normal limits. Electronically Signed   By: Lowella Grip III M.D.   On: 06/04/2020 12:28    EKG: Independently reviewed.  Sinus rhythm.  New T wave inversions in  V2-V3.  Assessment/Plan Principal Problem:   Chest pain Active Problems:   Asthma   COPD (chronic obstructive pulmonary disease) ([facility])   Suicidal ideation   Tobacco abuse   Chest pain: Currently hemodynamically stable. High-sensitivity troponin 76 > 61.  EKG showing new T wave inversions in V2-V3.  UDS positive for cocaine.  ED provider discussed the case with Dr. Virgina Jock from cardiology who will consult.  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  Recommended starting him on heparin. -Cardiac monitoring.  IV heparin.  Give full dose aspirin.  Avoid beta-blockers given cocaine use.  Sublingual nitroglycerin as needed.  Keep n.p.o. after midnight.  Suicidal ideation -Suicide precautions.  Psych consult placed.  Asthma, COPD: Stable.  No signs of acute exacerbation. -Continue home inhalers  Hypertension: Currently normotensive. -Continue home amlodipine  Tobacco use -NicoDerm patch and counseling  Cocaine use -Social work consult and counseling  Alcohol use disorder: Currently not displaying any signs of withdrawal. -CIWA protocol; Ativan as needed.  Thiamine, folate, and multivitamin.  DVT prophylaxis: Heparin Code Status: Full code Family Communication: No family available at this time. Disposition Plan: Status is: Inpatient  Remains inpatient appropriate because:Ongoing diagnostic testing needed not appropriate for outpatient work up, IV treatments appropriate due to intensity of illness or inability to take PO and Inpatient level of care appropriate due to severity of illness   Dispo: The patient is from: Home              Anticipated d/c is to: Home              Anticipated d/c date is: 2 days              Patient currently is not medically stable to d/c.  The medical decision making on this patient was of high complexity and  the patient is at high risk for clinical deterioration, therefore this is a level 3 visit.  Shela Leff MD [facility]  If 7PM-7AM, please contact night-coverage  [email or web address]  06/05/2020, 12:25 AM

## 2020-06-05 NOTE — H&P (View-Only) (Signed)
CARDIOLOGY CONSULT NOTE  Patient ID: Robert Chan MRN: [id number] DOB/AGE: 1956/05/24 64 y.o.  Admit date: 06/04/2020 Referring Physician: Shelly Coss, MD Primary Physician:  Arman Bogus., MD Reason for Consultation: Chest pain  HPI:  Robert Chan is a 64 y.o. male who presents with a chief complaint of " chest pain." He past medical history and cardiovascular risk factors include: Asthma, COPD, history of left lower extremity DVT, hypertension, history of congestive heart failure per patient, active use of tobacco/alcohol/cocaine.  Patient presents to the hospital with a chief complaint of chest pain, shortness of breath, and suicidal ideation.  Cardiology was consulted for evaluation and management of chest pain.  Patient states that he was having chest discomfort starting Monday which was approximately 2 days ago after working out.  The pain is located substernally, worse with walking, minimal improvement with resting.  Patient thought it was secondary to possibly asthma or underlying COPD.  Patient states that he drink a couple glasses of water sat down, and also used his inhalers.  But the pressure-like sensation continued.  The pressure-like sensation was located substernally 10 out of 10 and at the time of ER presentation it was 8 out of 10 per patient.  The pain is nonradiating.  His last use of cocaine was Monday, June 02, 2020.  Patient states that his discomfort that he may have experienced in the past due to cocaine is located to the left of his sternum.  But this is different discomfort located substernally.  Patient states that he did undergo left heart catheterization in [place] approximately 5 years ago and was told that he has some disease but nothing significant for coronary intervention.  I do not have that report or films to review.  EKG 06/04/2020: Normal sinus, 76 bpm, low voltage in the limb leads, very subtle ST changes in inferior leads, T wave inversions in  anteroseptal leads.  High sensitive troponin peaked at 76.  Now downtrending.  Patient still has a degree of chest discomfort which he describes as pressure-like sensation.  ALLERGIES: Allergies  Allergen Reactions  . Shellfish Allergy Hives and Rash   PAST MEDICAL HISTORY: Past Medical History:  Diagnosis Date  . Asthma   . Cataract   . COPD (chronic obstructive pulmonary disease) ([place])   . Hypertension   History of left lower extremity DVT, per patient. History of congestive heart failure, per patient  PAST SURGICAL HISTORY: Past Surgical History:  Procedure Laterality Date  . APPENDECTOMY    . EYE SURGERY    . PROSTATE SURGERY    History of left heart catheterization approximately 5 years ago in [place]: The patient family history includes Diabetes in his mother; Hypertension in his father, mother, and sister.   SOCIAL HISTORY:  The patient  reports that he has been smoking. He has never used smokeless tobacco. He reports current alcohol use. He reports that he does not use drugs.  Smokes approximately 4 to 5 cigarettes/day, alcohol daily up to (2) 40 ounce beers, cocaine use.  MEDICATIONS: Medications Prior to Admission  Medication Sig Dispense Refill Last Dose  . albuterol (PROVENTIL) (5 MG/ML) 0.5% nebulizer solution Take 0.5 mLs (2.5 mg total) by nebulization every 6 (six) hours as needed for wheezing or shortness of breath. 20 mL 12 06/04/2020 at Unknown time  . albuterol (VENTOLIN HFA) 108 (90 Base) MCG/ACT inhaler Inhale 1-2 puffs into the lungs every 6 (six) hours as needed for wheezing or shortness of breath. 90 g  2 Unknown at Unknown  . amLODipine (NORVASC) 5 MG tablet Take 1 tablet (5 mg total) by mouth daily. 30 tablet 1 Past Week at Unknown time  . ARIPiprazole (ABILIFY) 2 MG tablet Take 1 tablet (2 mg total) by mouth daily. 30 tablet 0 Past Week at Unknown time  . mirtazapine (REMERON) 15 MG tablet Take 1 tablet (15 mg total) by mouth at  bedtime. 30 tablet 2 Past Week at Unknown time  . SYMBICORT 160-4.5 MCG/ACT inhaler Inhale 2 puffs into the lungs in the morning and at bedtime. 1 Inhaler 3 06/04/2020 at Unknown time  . predniSONE (DELTASONE) 20 MG tablet 3 tabs po day one, then 2 po daily x 4 days (Patient not taking: Reported on 06/04/2020) 11 tablet 0 Not Taking at Unknown time     Current Outpatient Medications  Medication Instructions  . albuterol (PROVENTIL) 2.5 mg, Nebulization, Every 6 hours PRN  . albuterol (VENTOLIN HFA) 108 (90 Base) MCG/ACT inhaler 1-2 puffs, Inhalation, Every 6 hours PRN  . amLODipine (NORVASC) 5 mg, Oral, Daily  . ARIPiprazole (ABILIFY) 2 mg, Oral, Daily  . mirtazapine (REMERON) 15 mg, Oral, Daily at bedtime  . predniSONE (DELTASONE) 20 MG tablet 3 tabs po day one, then 2 po daily x 4 days  . SYMBICORT 160-4.5 MCG/ACT inhaler 2 puffs, Inhalation, 2 times daily    Review of Systems  Constitutional: Negative for chills and fever.  HENT: Negative for hoarse voice and nosebleeds.   Eyes: Negative for discharge, double vision and pain.  Cardiovascular: Positive for chest pain. Negative for claudication, dyspnea on exertion, leg swelling, near-syncope, orthopnea, palpitations, paroxysmal nocturnal dyspnea and syncope.  Respiratory: Positive for shortness of breath. Negative for hemoptysis.   Musculoskeletal: Negative for muscle cramps and myalgias.  Gastrointestinal: Negative for abdominal pain, constipation, diarrhea, hematemesis, hematochezia, melena, nausea and vomiting.  Neurological: Negative for dizziness and light-headedness.  All other systems reviewed and are negative.  PHYSICAL EXAM: Vitals with BMI 06/05/2020 06/05/2020 06/05/2020  Height - - -  Weight - - -  BMI - - -  Systolic [phone]  Diastolic 87 80 78  Pulse 65 69 67  Some encounter information is confidential and restricted. Go to Review Flowsheets activity to see all data.    No intake or output data in the 24 hours ending  06/05/20 7510  Net IO Since Admission: No IO data has been entered for this period [06/05/20 0922] CONSTITUTIONAL: Well-developed and well-nourished. No acute distress.  SKIN: Skin is warm and dry. No rash noted. No cyanosis. No pallor. No jaundice HEAD: Normocephalic and atraumatic.  EYES: No scleral icterus MOUTH/THROAT: Moist oral membranes.  NECK: No JVD present. No thyromegaly noted. No carotid bruits  LYMPHATIC: No visible cervical adenopathy.  CHEST Normal respiratory effort. No intercostal retractions  LUNGS: Clear to auscultation bilaterally.  No stridor. No wheezes. No rales.  CARDIOVASCULAR: Regular, positive S1-S2, no murmurs rubs or appreciated. ABDOMINAL: No apparent ascites.  EXTREMITIES: Warm to touch bilaterally, trace bilateral pitting edema at HEMATOLOGIC: No significant bruising NEUROLOGIC: Oriented to person, place, and time. Nonfocal. Normal muscle tone.  PSYCHIATRIC: Normal mood and affect. Normal behavior. Cooperative  RADIOLOGY: DG Chest 2 View  Result Date: 06/04/2020 CLINICAL DATA:  Chest pain and shortness of breath EXAM: CHEST - 2 VIEW COMPARISON:  May 02, 2020 FINDINGS: Lungs are clear. Heart size and pulmonary vascularity are normal. No adenopathy. No pneumothorax. There is mild degenerative change in the thoracic spine. IMPRESSION: Lungs clear.  Cardiac silhouette within normal limits. Electronically Signed   By: Lowella Grip III M.D.   On: 06/04/2020 12:28    LABORATORY DATA: Lab Results  Component Value Date   WBC 13.9 (H) 06/04/2020   HGB 13.4 06/04/2020   HCT 41.9 06/04/2020   MCV 94.8 06/04/2020   PLT 259 06/04/2020    Recent Labs  Lab 06/04/20 1201  NA 136  K 4.1  CL 102  CO2 21*  BUN 11  CREATININE 1.33*  CALCIUM 9.2  GLUCOSE 85    Lipid Panel     Component Value Date/Time   CHOL 187 09/25/2019 0628   TRIG 69 09/25/2019 0628   HDL 59 09/25/2019 0628   CHOLHDL 3.2 09/25/2019 0628   VLDL 14 09/25/2019 0628   LDLCALC 114  (H) 09/25/2019 0628    BNP (last 3 results) Recent Labs    05/02/20 0658  BNP 45.8    HEMOGLOBIN A1C Lab Results  Component Value Date   HGBA1C 5.1 09/25/2019   MPG 99.67 09/25/2019   TSH Recent Labs    09/25/19 0628 02/28/20 1014  TSH 2.103 2.020     Scheduled Meds: . amLODipine  5 mg Oral Daily  . folic acid  1 mg Oral Daily  . mometasone-formoterol  2 puff Inhalation BID  . multivitamin with minerals  1 tablet Oral Daily  . nicotine  7 mg Transdermal Daily  . thiamine  100 mg Oral Daily   Or  . thiamine  100 mg Intravenous Daily   Continuous Infusions: PRN Meds:.acetaminophen, albuterol, LORazepam **OR** LORazepam, nitroGLYCERIN  CARDIAC DATABASE: EKG: EKG 06/04/2020: Normal sinus, 76 bpm, low voltage in the limb leads, very subtle ST changes in inferior leads, T wave inversions in anteroseptal leads.  Echocardiogram: None  IMPRESSION & RECOMMENDATIONS:  Dominie Benedick is a 64 y.o. male whose past medical history and cardiovascular risk factors include: Asthma, COPD, history of left lower extremity DVT, hypertension, history of congestive heart failure per patient, active use of tobacco/alcohol/cocaine.  Non-STEMI:  Patient presents with symptoms of chest pain which is typical in nature, new T wave inversions in the anteroseptal leads, mild elevation in troponins.  Patient also states that he had a cath about 5 years ago and was told to have nonobstructive disease without prior coronary interventions.  Recent episode could be secondary to progression of CAD or vasospastic response to recent cocaine use.  Recommend continuing heparin  Avoid beta-blockers given the recent use of cocaine.  Echocardiogram will be ordered to evaluate for structural heart disease and left ventricular systolic function.  Aspirin 325 mg p.o. x1 if not already given.  Check fasting lipid profile.  Check BNP  Discussed undergoing left heart catheterization.  Patient is  agreeable.  The left heart catheterization procedure was explained to the patient in detail. The indication, alternatives, risks and benefits were reviewed. Complications including but not limited to bleeding, infection, acute kidney injury, blood transfusion, heart rhythm disturbances, contrast (dye) reaction, damage to the arteries or nerves in the legs or hands, cerebrovascular accident, myocardial infarction, need for emergent bypass surgery, blood clots in the legs, possible need for emergent blood transfusion, and rarely death were reviewed and discussed with the patient. The patient voices understanding and wishes to proceed.   Patient is allergic to shellfish recommend steroids prior to the left heart catheterization.  N.p.o. for now.  Further recommendations to follow.  Benign essential hypertension: Management per primary team.  Tobacco use: Educated on importance of complete  smoking cessation.  Cocaine use: Educated on importance of complete cocaine cessation.  Continue your care regarding his chronic comorbid conditions.  Patient's questions and concerns were addressed to his satisfaction. He voices understanding of the instructions provided during this encounter.   This note was created using a voice recognition software as a result there may be grammatical errors inadvertently enclosed that do not reflect the nature of this encounter. Every attempt is made to correct such errors.  Rex Kras, DO, [facility] Office: [phone] 06/05/2020, 9:22 AM
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
## 2020-06-05 NOTE — TOC CAGE-AID Note (Signed)
Transition of Care [facility]) - CAGE-AID Screening   Patient Details  Name: Robert Chan MRN: [id number] Date of Birth: [birth year]  [facility] CM/SW Contact:    Emeterio Reeve, [place] Phone Number: 06/05/2020, 2:52 PM   Clinical Narrative:  CSW met with pt at bedside. CSW introduced self and explained her role at the hospital.  Pt report that he drinks alcohol most days of the week. Pt reports the amount he drinks depends on how much money he has. Pt reports that he recognizes that he needs to stop drinking Pt reports occasional cocaine use. Pt states that he usually cant afford to buy it so uses it when he can afford it.   Pt was receptive to counseling and accepting resources and educational resources. CSW and pt also discussed AA. Pt states AA will be a better place to start since alcohol use is bigger than the substance use.   CAGE-AID Screening:    Have You Ever Felt You Ought to Cut Down on Your Drinking or Drug Use?: Yes Have People Annoyed You By Critizing Your Drinking Or Drug Use?: Yes Have You Felt Bad Or Guilty About Your Drinking Or Drug Use?: Yes Have You Ever Had a Drink or Used Drugs First Thing In The Morning to Steady Your Nerves or to Get Rid of a Hangover?: No CAGE-AID Score: 3  Substance Abuse Education Offered: Yes  Substance abuse interventions: Patient Counseling, Educational Materials  Emeterio Reeve, Latanya Presser, [place] Social Worker [phone]

## 2020-06-05 NOTE — Social Work (Signed)
CSW gave pt 1 bus pass for discharge.  Emeterio Reeve, Latanya Presser, [place] Social Worker [phone]

## 2020-06-05 NOTE — Interval H&P Note (Signed)
History and Physical Interval Note:  06/05/2020 10:34 AM  Robert Chan  has presented today for surgery, with the diagnosis of chest pain.  The various methods of treatment have been discussed with the patient and family. After consideration of risks, benefits and other options for treatment, the patient has consented to  Procedure(s): LEFT HEART CATH AND CORONARY ANGIOGRAPHY (N/A) as a surgical intervention.  The patient's history has been reviewed, patient examined, no change in status, stable for surgery.  I have reviewed the patient's chart and labs.  Questions were answered to the patient's satisfaction.    2016 Appropriate Use Criteria for Coronary Revascularization in Patients With Acute Coronary Syndrome NSTEMI/UA High Risk (TIMI Score 5-7) NSTEMI/Unstable angina, stabilized patient at high risk Link Here: sistemancia.com Indication:  Revascularization by PCI or CABG of 1 or more arteries in a patient with NSTEMI or unstable angina with Stabilization after presentation High risk for clinical events  A (7) Indication: 16; Score 7   Alligator

## 2020-06-05 NOTE — Discharge Summary (Signed)
Physician Discharge Summary  Robert Chan CBJ:[id number] DOB: May 28, 1956 DOA: 06/04/2020  PCP: Arman Bogus., MD  Admit date: 06/04/2020 Discharge date: 06/05/2020  Admitted From: Home Disposition:  Home  Discharge Condition:Stable CODE STATUS:FULL Diet recommendation: Heart Healthy  Brief/Interim Summary: HPI: Robert Chan is a 64 y.o. male with medical history significant of asthma, COPD, hypertension, tobacco use presenting with complaints of chest pain, shortness of breath, and suicidal ideation.  Patient reports having substernal chest pressure and shortness of breath with exercise for the past few days.  No associated dyspnea or diaphoresis.  Symptoms have been getting progressively worse which made him come into the ED today to be evaluated.  States he has had depression since age of 17 and has been on medications.  He continues to feel depressed despite taking his medications and they make him feel sleepy.  States he owns a gun and his plan is to shoot himself.  States he has tried to kill himself multiple times in the past by shooting himself and overdosing on pills.  States 3 of his brothers and his nephew also committed suicide.  He has been using cocaine and thinks he last used it 2 days ago.  He drinks 3-4 40 oz beers every day, last drink a day ago.  He smokes 0.25 packs of cigarettes daily.  He has been vaccinated against Covid.  ED Course: Afebrile.  Slightly tachycardic on arrival with heart rate 104.  Slightly hypertensive with systolic in the 160V.  Not tachypneic or hypoxic.  WBC count mildly elevated at 13.9 and stable compared to labs done a month ago.  Creatinine 1.3, at baseline.  High-sensitivity troponin 76 > 61.  EKG showing new T wave inversions in V2-V3.  Blood ethanol level undetectable.  Salicylate level undetectable.  Acetaminophen level undetectable.  Hospital course:  His hospital course remained stable. He was seen by cardiology after admission. He underwent  cardiac cath without finding of obstructive coronary disease. Most likely his chest pain was triggered by coronary vasospasm from cocaine. Cardiology cleared him for discharge. He was also seen by psychiatry and found to have no suicidal intention or ideation. He is medically stable for discharge home today.  Following problems were addressed during his hospitalization:   Chest pain: Currently hemodynamically stable. High-sensitivity troponin 76 > 61.  EKG showing new T wave inversions in V2-V3.  UDS positive for cocaine.   Chest pain-free.He was seen by cardiology after admission. He underwent cardiac cath without finding of obstructive coronary disease. Most likely his chest pain was triggered by coronary vasospasm from cocaine  Suicidal ideation Psychiatry rule out any suicidal ideation or intention. Continue mirtazapine. Abilify increased to 5 mg daily.  Asthma, COPD: Stable.  No signs of acute exacerbation. -Continue home inhalers  Hypertension: Currently normotensive. -Continue home amlodipine  Tobacco use -NicoDerm patch and counseling provided  Cocaine use Counseled for cessation  Alcohol use disorder: Currently not displaying any signs of withdrawal. Continue thiamine and folic acid    Discharge Diagnoses:  Principal Problem:   Chest pain Active Problems:   Asthma   COPD (chronic obstructive pulmonary disease) (HCC)   Suicidal ideation   Tobacco abuse   Non-ST elevation (NSTEMI) myocardial infarction [facility])    Discharge Instructions  Discharge Instructions    Diet - low sodium heart healthy   Complete by: As directed    Discharge instructions   Complete by: As directed    1)Please take prescribed medications as instructed. 2)Stop cocaine abuse 3)Follow  up with your PCP and psychiatrist as an outpatient.   Increase activity slowly   Complete by: As directed      Allergies as of 06/05/2020      Reactions   Shellfish Allergy Hives, Rash      Medication  List    STOP taking these medications   predniSONE 20 MG tablet Commonly known as: DELTASONE     TAKE these medications   albuterol (5 MG/ML) 0.5% nebulizer solution Commonly known as: PROVENTIL Take 0.5 mLs (2.5 mg total) by nebulization every 6 (six) hours as needed for wheezing or shortness of breath.   albuterol 108 (90 Base) MCG/ACT inhaler Commonly known as: VENTOLIN HFA Inhale 1-2 puffs into the lungs every 6 (six) hours as needed for wheezing or shortness of breath.   amLODipine 5 MG tablet Commonly known as: NORVASC Take 1 tablet (5 mg total) by mouth daily.   ARIPiprazole 5 MG tablet Commonly known as: ABILIFY Take 1 tablet (5 mg total) by mouth daily. What changed:   medication strength  how much to take   folic acid 1 MG tablet Commonly known as: FOLVITE Take 1 tablet (1 mg total) by mouth daily. Start taking on: June 06, 2020   mirtazapine 15 MG tablet Commonly known as: Remeron Take 1 tablet (15 mg total) by mouth at bedtime.   nicotine 7 mg/24hr patch Commonly known as: NICODERM CQ - dosed in mg/24 hr Place 1 patch (7 mg total) onto the skin daily. Start taking on: June 06, 2020   Symbicort 160-4.5 MCG/ACT inhaler Generic drug: budesonide-formoterol Inhale 2 puffs into the lungs in the morning and at bedtime.   thiamine 100 MG tablet Take 1 tablet (100 mg total) by mouth daily. Start taking on: June 06, 2020       Follow-up Information    Arman Bogus., MD. Schedule an appointment as soon as possible for a visit in 1 week(s).   Specialty: Internal Medicine Contact information: [place] [address] [phone]              Allergies  Allergen Reactions  . Shellfish Allergy Hives and Rash    Consultations:  Cardiology, psychiatry   Procedures/Studies: DG Chest 2 View  Result Date: 06/04/2020 CLINICAL DATA:  Chest pain and shortness of breath EXAM: CHEST - 2 VIEW COMPARISON:  May 02, 2020 FINDINGS: Lungs are clear. Heart size and pulmonary vascularity are normal. No adenopathy. No pneumothorax. There is mild degenerative change in the thoracic spine. IMPRESSION: Lungs clear.  Cardiac silhouette within normal limits. Electronically Signed   By: Lowella Grip III M.D.   On: 06/04/2020 12:28   CARDIAC CATHETERIZATION  Result Date: 06/05/2020 LM: Normal LAD: Prox 30% stenosis. Mid LAD myocardial bridge        Slow flow improved with IC NTG LCx: Normal RCA: Mid 20% disease Mild nonobstructive CAD Chest pain and trop elevation due to cocaine induced vasospasm    Discharge Exam: Vitals:   06/05/20 1122 06/05/20 1224  BP: (!) 133/100   Pulse:    Resp: 16   Temp:  97.9 F (36.6 C)  SpO2: 98%    Vitals:   06/05/20 1101 06/05/20 1106 06/05/20 1122 06/05/20 1224  BP: 134/86 139/86 (!) 133/100   Pulse: 65 64    Resp: 14 18 16    Temp:    97.9 F (36.6 C)  TempSrc:    Oral  SpO2: 96% 97% 98%   Weight:  Height:        General: Pt is alert, awake, not in acute distress Cardiovascular: RRR, S1/S2 +, no rubs, no gallops Respiratory: CTA bilaterally, no wheezing, no rhonchi Abdominal: Soft, NT, ND, bowel sounds + Extremities: no edema, no cyanosis    The results of significant diagnostics from this hospitalization (including imaging, microbiology, ancillary and laboratory) are listed below for reference.     Microbiology: Recent Results (from the past 240 hour(s))  SARS Coronavirus 2 by RT PCR (hospital order, performed in [facility] hospital lab) Nasopharyngeal Nasopharyngeal Swab     Status: None   Collection Time: 06/04/20 11:56 PM   Specimen: Nasopharyngeal Swab  Result Value Ref Range Status   SARS Coronavirus 2 NEGATIVE NEGATIVE Final    Comment: (NOTE) SARS-CoV-2 target nucleic acids are NOT DETECTED.  The SARS-CoV-2 RNA is generally detectable in upper and lower respiratory specimens during the acute phase of infection. The lowest concentration  of SARS-CoV-2 viral copies this assay can detect is 250 copies / mL. A negative result does not preclude SARS-CoV-2 infection and should not be used as the sole basis for treatment or other patient management decisions.  A negative result may occur with improper specimen collection / handling, submission of specimen other than nasopharyngeal swab, presence of viral mutation(s) within the areas targeted by this assay, and inadequate number of viral copies (<250 copies / mL). A negative result must be combined with clinical observations, patient history, and epidemiological information.  Fact Sheet for Patients:   StrictlyIdeas.no  Fact Sheet for Healthcare Providers: BankingDealers.co.za  This test is not yet approved or  cleared by the [place] FDA and has been authorized for detection and/or diagnosis of SARS-CoV-2 by FDA under an Emergency Use Authorization (EUA).  This EUA will remain in effect (meaning this test can be used) for the duration of the COVID-19 declaration under Section 564(b)(1) of the Act, 21 U.S.C. section 360bbb-3(b)(1), unless the authorization is terminated or revoked sooner.  Performed at [facility], [place] [address] [place], [place] [address]      Labs: BNP (last 3 results) Recent Labs    05/02/20 0658  BNP 38.9   Basic Metabolic Panel: Recent Labs  Lab 06/04/20 1201 06/05/20 0638  NA 136 138  K 4.1 4.2  CL 102 104  CO2 21* 23  GLUCOSE 85 87  BUN 11 14  CREATININE 1.33* 1.36*  CALCIUM 9.2 8.7*  MG  --  2.1  PHOS  --  4.2   Liver Function Tests: No results for input(s): AST, ALT, ALKPHOS, BILITOT, PROT, ALBUMIN in the last 168 hours. No results for input(s): LIPASE, AMYLASE in the last 168 hours. No results for input(s): AMMONIA in the last 168 hours. CBC: Recent Labs  Lab 06/04/20 1201  WBC 13.9*  HGB 13.4  HCT 41.9  MCV 94.8  PLT 259   Cardiac Enzymes: No results for  input(s): CKTOTAL, CKMB, CKMBINDEX, TROPONINI in the last 168 hours. BNP: Invalid input(s): POCBNP CBG: No results for input(s): GLUCAP in the last 168 hours. D-Dimer No results for input(s): DDIMER in the last 72 hours. Hgb A1c No results for input(s): HGBA1C in the last 72 hours. Lipid Profile No results for input(s): CHOL, HDL, LDLCALC, TRIG, CHOLHDL, LDLDIRECT in the last 72 hours. Thyroid function studies No results for input(s): TSH, T4TOTAL, T3FREE, THYROIDAB in the last 72 hours.  Invalid input(s): FREET3 Anemia work up No results for input(s): VITAMINB12, FOLATE, FERRITIN, TIBC, IRON, RETICCTPCT  in the last 72 hours. Urinalysis    Component Value Date/Time   COLORURINE YELLOW 01/09/2017 1126   APPEARANCEUR CLEAR 01/09/2017 1126   LABSPEC 1.025 01/09/2017 1126   PHURINE 5.0 01/09/2017 1126   GLUCOSEU NEGATIVE 01/09/2017 1126   HGBUR SMALL (A) 01/09/2017 1126   [place] 01/09/2017 1126   KETONESUR NEGATIVE 01/09/2017 1126   PROTEINUR NEGATIVE 01/09/2017 1126   NITRITE NEGATIVE 01/09/2017 1126   LEUKOCYTESUR NEGATIVE 01/09/2017 1126   Sepsis Labs Invalid input(s): PROCALCITONIN,  WBC,  LACTICIDVEN Microbiology Recent Results (from the past 240 hour(s))  SARS Coronavirus 2 by RT PCR (hospital order, performed in [place] hospital lab) Nasopharyngeal Nasopharyngeal Swab     Status: None   Collection Time: 06/04/20 11:56 PM   Specimen: Nasopharyngeal Swab  Result Value Ref Range Status   SARS Coronavirus 2 NEGATIVE NEGATIVE Final    Comment: (NOTE) SARS-CoV-2 target nucleic acids are NOT DETECTED.  The SARS-CoV-2 RNA is generally detectable in upper and lower respiratory specimens during the acute phase of infection. The lowest concentration of SARS-CoV-2 viral copies this assay can detect is 250 copies / mL. A negative result does not preclude SARS-CoV-2 infection and should not be used as the sole basis for treatment or other patient management  decisions.  A negative result may occur with improper specimen collection / handling, submission of specimen other than nasopharyngeal swab, presence of viral mutation(s) within the areas targeted by this assay, and inadequate number of viral copies (<250 copies / mL). A negative result must be combined with clinical observations, patient history, and epidemiological information.  Fact Sheet for Patients:   StrictlyIdeas.no  Fact Sheet for Healthcare Providers: BankingDealers.co.za  This test is not yet approved or  cleared by the [place] FDA and has been authorized for detection and/or diagnosis of SARS-CoV-2 by FDA under an Emergency Use Authorization (EUA).  This EUA will remain in effect (meaning this test can be used) for the duration of the COVID-19 declaration under Section 564(b)(1) of the Act, 21 U.S.C. section 360bbb-3(b)(1), unless the authorization is terminated or revoked sooner.  Performed at [facility], [place] [address], [place], [place] [address]     Please note: You were cared for by a hospitalist during your hospital stay. Once you are discharged, your primary care physician will handle any further medical issues. Please note that NO REFILLS for any discharge medications will be authorized once you are discharged, as it is imperative that you return to your primary care physician (or establish a relationship with a primary care physician if you do not have one) for your post hospital discharge needs so that they can reassess your need for medications and monitor your lab values.    Time coordinating discharge: 40 minutes  SIGNED:   Shelly Coss, MD  [facility] 06/05/2020, 1:55 PM Pager [phone]  If 7PM-7AM, please contact night-coverage [email or web address] Password [email or web address]

## 2020-06-11 NOTE — Congregational Nurse Program (Signed)
  Dept: [facility] Nurse Program Note  Date of Encounter: 06/10/2020  Past Medical History: Past Medical History:  Diagnosis Date  . Asthma   . Cataract   . COPD (chronic obstructive pulmonary disease) ([place])   . Hypertension     Encounter Details:  CNP Questionnaire - 06/10/20 1534      Questionnaire   Patient Status Not Applicable    Race Black or African American    Location Patient Served At [place]    Uninsured Not Applicable    Food No food insecurities    Housing/Utilities No permanent housing    Transportation No transportation needs    Interpersonal Safety Yes, feel physically and emotionally safe where you currently live    Medication Yes, have medication insecurities    Medical Provider No    Referrals Primary Care Provider/Clinic;Area Agency;Behavioral/Mental Health Provider    ED Visit Averted Not Applicable    Life-Saving Intervention Made Not Applicable         Client stopped by to see nurse states he has been in the hospital last ,thought he was having a heart attack but everything checked out.,Had a heart craterization ?He hasn't been seen at [facility] yet to establish care states he has an appointment 8-19 as other appointments have been missed and rescheduled for various reasons . NO housing yet ,will decide if he stays here or moves near his sisters out of state. Struggling with what to do ,listened as he talked about his past relationships and and where he wants to go with his life. Stated he told staff he would not hurt himself but has always had to deal with a lot and has been in and out of prison several times . Lots on his mind ,states his mental health medication makes him feel sleepy and it was increased so that isn't the answer for him ,doesn't want to feel sleepy all the time . Wants to have medication that keeps him in balance so he wont be too far up or too far down! His wife states he thought he was  divorced from her but not has been calling him from prison as she is serving 10 years ,talked about that relationship and how he didn't want to go back to all of that. Client seems to have a lot on his mind ,was allowed to ventilate . Need behavioral health counseling . Chart review indicates he is drinking and using substances and needs AA support group. . Will continue client to establish care  With PCP , join support group . Follow weekly

## 2020-06-11 NOTE — Congregational Nurse Program (Signed)
  Dept: [facility] Nurse Program Note  Date of Encounter: 06/11/2020  Past Medical History: Past Medical History:  Diagnosis Date  . Asthma   . Cataract   . COPD (chronic obstructive pulmonary disease) ([place])   . Hypertension     Encounter Details:  CNP Questionnaire - 06/11/20 1545      Questionnaire   Patient Status Not Applicable    Race Black or African American    Location Patient Served At Not Applicable    Insurance Private Insurance;Medicaid;Medicare    Uninsured Not Applicable    Food No food insecurities    Housing/Utilities No permanent housing    Transportation No transportation needs    Interpersonal Safety Yes, feel physically and emotionally safe where you currently live    Medication No medication insecurities    Medical Provider No    Referrals Primary Care Provider/Clinic;Area Agency;Behavioral/Mental Health Provider;Medication Assistance    ED Visit Averted Not Applicable    Life-Saving Intervention Made Not Applicable         client in today taking about sisters child recently diagnosed with cancer ,may need to go check on her but wants his housing in place first . Hopes his housing works out for him. Reviewed his medication box ,threw out bottles not refillable ,reviewed other med's for mood,behavioral  Problems ( . Client had albertol inhalation med's with no machine ,will need inhaler and will need to visit [facility] to get order.. Encouraged o seek service this week for his asthma. To reorder his blood pressure medication from [facility]. Needs help with eye appointment ,nurse will assist next week offices closed now.  Follow weekly

## 2020-06-19 ENCOUNTER — Encounter: Payer: Self-pay | Admitting: Critical Care Medicine

## 2020-06-19 ENCOUNTER — Ambulatory Visit: Payer: Medicare HMO | Attending: Critical Care Medicine | Admitting: Critical Care Medicine

## 2020-06-19 ENCOUNTER — Other Ambulatory Visit: Payer: Self-pay

## 2020-06-19 VITALS — BP 115/79 | HR 75 | Resp 20 | Ht 76.5 in | Wt 259.0 lb

## 2020-06-19 DIAGNOSIS — F332 Major depressive disorder, recurrent severe without psychotic features: Secondary | ICD-10-CM | POA: Diagnosis not present

## 2020-06-19 DIAGNOSIS — J410 Simple chronic bronchitis: Secondary | ICD-10-CM

## 2020-06-19 DIAGNOSIS — I25118 Atherosclerotic heart disease of native coronary artery with other forms of angina pectoris: Secondary | ICD-10-CM | POA: Diagnosis not present

## 2020-06-19 DIAGNOSIS — I10 Essential (primary) hypertension: Secondary | ICD-10-CM

## 2020-06-19 DIAGNOSIS — R45851 Suicidal ideations: Secondary | ICD-10-CM

## 2020-06-19 DIAGNOSIS — H4020X0 Unspecified primary angle-closure glaucoma, stage unspecified: Secondary | ICD-10-CM

## 2020-06-19 DIAGNOSIS — Z720 Tobacco use: Secondary | ICD-10-CM

## 2020-06-19 DIAGNOSIS — Z5900 Homelessness unspecified: Secondary | ICD-10-CM

## 2020-06-19 DIAGNOSIS — I214 Non-ST elevation (NSTEMI) myocardial infarction: Secondary | ICD-10-CM

## 2020-06-19 DIAGNOSIS — F1410 Cocaine abuse, uncomplicated: Secondary | ICD-10-CM

## 2020-06-19 DIAGNOSIS — H539 Unspecified visual disturbance: Secondary | ICD-10-CM

## 2020-06-19 DIAGNOSIS — I2510 Atherosclerotic heart disease of native coronary artery without angina pectoris: Secondary | ICD-10-CM | POA: Insufficient documentation

## 2020-06-19 DIAGNOSIS — F1020 Alcohol dependence, uncomplicated: Secondary | ICD-10-CM

## 2020-06-19 DIAGNOSIS — H2511 Age-related nuclear cataract, right eye: Secondary | ICD-10-CM

## 2020-06-19 HISTORY — DX: Homelessness unspecified: Z59.00

## 2020-06-19 MED ORDER — ATORVASTATIN CALCIUM 20 MG PO TABS
20.0000 mg | ORAL_TABLET | Freq: Every day | ORAL | 3 refills | Status: DC
Start: 2020-06-19 — End: 2020-09-08

## 2020-06-19 MED ORDER — FOLIC ACID 1 MG PO TABS: 1.0000 mg | ORAL_TABLET | Freq: Every day | ORAL | 1 refills | Status: DC

## 2020-06-19 MED ORDER — ALBUTEROL SULFATE (5 MG/ML) 0.5% IN NEBU: 2.5000 mg | INHALATION_SOLUTION | Freq: Four times a day (QID) | RESPIRATORY_TRACT | 12 refills | Status: DC | PRN

## 2020-06-19 MED ORDER — ARIPIPRAZOLE 5 MG PO TABS: 5.0000 mg | ORAL_TABLET | Freq: Every day | ORAL | 0 refills | Status: DC

## 2020-06-19 MED ORDER — SYMBICORT 160-4.5 MCG/ACT IN AERO: 2.0000 | INHALATION_SPRAY | Freq: Two times a day (BID) | RESPIRATORY_TRACT | 11 refills | Status: DC

## 2020-06-19 MED ORDER — MIRTAZAPINE 15 MG PO TABS: 15.0000 mg | ORAL_TABLET | Freq: Every day | ORAL | 2 refills | Status: DC

## 2020-06-19 MED ORDER — ALBUTEROL SULFATE HFA 108 (90 BASE) MCG/ACT IN AERS: 1.0000 | INHALATION_SPRAY | Freq: Four times a day (QID) | RESPIRATORY_TRACT | 2 refills | Status: DC | PRN

## 2020-06-19 MED ORDER — THIAMINE HCL 100 MG PO TABS: 100.0000 mg | ORAL_TABLET | Freq: Every day | ORAL | 1 refills | Status: DC

## 2020-06-19 MED ORDER — ASPIRIN EC 81 MG PO TBEC: 81.0000 mg | DELAYED_RELEASE_TABLET | Freq: Every day | ORAL | 11 refills | Status: DC

## 2020-06-19 MED ORDER — AMLODIPINE BESYLATE 5 MG PO TABS: 5.0000 mg | ORAL_TABLET | Freq: Every day | ORAL | 1 refills | Status: DC

## 2020-06-19 NOTE — Progress Notes (Signed)
Unable to work out as often d/t fatigue and SOB

## 2020-06-19 NOTE — Progress Notes (Signed)
Subjective:    Patient ID: Robert Chan, male    DOB: 1956/09/06, 64 y.o.   MRN: [id number]  This is a 64 year old male resident of the shelter and [place] comes to the mobile clinic with concerns of potential Covid.  His symptoms began on 9 February.  He says increased loose stools, sneezing fatigue nasal congestion but no cough.  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  06/19/2020 Wt Readings from Last 3 Encounters: 06/19/20 : 259 lb (117.5 kg) 06/11/20 : 260 lb (117.9 kg) 06/04/20 : 250 lb (113.4 kg)  Since the last visit the patient was hospitalized for hypertension and acute heart failure with tachycardia.  The patient had been using cocaine and occasional alcohol.  Note the patient is a resident of the [facility] shelter has been there for months.  He has been followed to the mobile medicine clinic on several occasions.  He is still smoking 2 cigarettes a day.  The patient has been fairly active trying to work out but has noticed suddenly worsening in his symptom complex.  Note during the hospitalization he underwent catheterization found to have mild coronary disease.  He had an NSTEMI with this admission.  No recommendations for atherosclerosis prevention was given during this hospitalization and he is not on a statin or aspirin.  He does not have a pending cardiology appointment. Note the patient has been under a great deal of stress being that he is in a homeless shelter.  The patient has had some suicidal ideation in the past.  Patient has lost 3  of his brothers and a nephew to suicide.  Brief/Interim Summary: XBM:WUXLKGM Robert Chan a 63 y.o.malewith medical history significant ofasthma, COPD, hypertension, tobacco use presenting with complaints of chest pain, shortness of breath, and suicidal ideation.Patient reports having substernal chest pressure and shortness of breath with exercise for the past few days. No associated dyspnea or diaphoresis. Symptoms have been getting progressively worse which made him come into the ED today to be evaluated. States he has had depression since age of 21 and has been on medications. He continues to feel depressed despite taking his medications and they make him feel sleepy. States he owns a gun and his plan is to shoot himself. States he has tried to kill himself multiple times in the pastby shooting himself and overdosing on pills. States 3 of his brothers and his nephew also committed suicide. He has been using cocaine and thinks he last used it 2 days ago. He drinks 3-4 40 ozbeers every day, last drink a day ago. He smokes 0.25packs of cigarettes daily. He has been vaccinated against Covid.  ED Course:Afebrile. Slightly tachycardic on arrival with heart rate 104. Slightly hypertensive with systolic in the 010U. Not tachypneic or hypoxic. WBC count mildly elevated at 13.9 and stable compared to labs done a month ago. Creatinine 1.3, at baseline. High-sensitivity troponin 76 >61. EKG showing new T wave inversions in V2-V3. Blood ethanol level undetectable. Salicylate level undetectable. Acetaminophen level undetectable.  Hospital course:  His hospital course remained stable. He was seen by cardiology after admission. He underwent cardiac cath without finding of obstructive coronary disease. Most likely his chest pain was triggered by coronary vasospasm from cocaine. Cardiology cleared him for discharge. He was also seen by psychiatry and found to have no suicidal intention or  ideation. He is medically stable for discharge home today.  Following problems were addressed during his hospitalization:   Chest pain:Currently hemodynamically stable.High-sensitivity troponin 76>61. EKG showing new T wave inversions in V2-V3. UDS positive for cocaine.  Chest pain-free.He was seen by cardiology after admission. He underwent cardiac cath without finding of obstructive coronary disease. Most likely his chest pain was triggered by coronary vasospasm from cocaine  Suicidal ideation Psychiatry rule out any suicidal ideation or intention. Continue mirtazapine. Abilify increased to 5 mg daily.  Asthma, COPD: Stable. No signs of acute exacerbation. -Continue home inhalers  Hypertension: Currently normotensive. -Continue home amlodipine  Tobacco use -NicoDerm patch and counseling provided  Cocaine use Counseled for cessation  Alcohol usedisorder: Currently not displaying any signs of withdrawal. Continue thiamine and folic acid  The patient is on Abilify and has been taking this regularly and does have follow-up with telemedicine visits with psychiatry due to his mental health conditions  The patient currently does not have any cough and is improved with his shortness of breath he is using his inhalers daily does not have any wheezing  Past Medical History:  Diagnosis Date  . Asthma   . Cataract   . COPD (chronic obstructive pulmonary disease) ([place])   . Hypertension      Family History  Problem Relation Age of Onset  . Hypertension Mother   . Diabetes Mother   . Hypertension Father   . Hypertension Sister      Social History   Socioeconomic History  . Marital status: Single    Spouse name: Not on file  . Number of children: Not on file  . Years of education: Not on file  . Highest education level: Not on file  Occupational History  . Not on file  Tobacco Use  . Smoking status: Current Some Day Smoker  . Smokeless tobacco: Never Used   Substance and Sexual Activity  . Alcohol use: Yes  . Drug use: No  . Sexual activity: Yes  Other Topics Concern  . Not on file  Social History Narrative  . Not on file   Social Determinants of Health   Financial Resource Strain:   . Difficulty of Paying Living Expenses: Not on file  Food Insecurity:   . Worried About Charity fundraiser in the Last Year: Not on file  . Ran Out of Food in the Last Year: Not on file  Transportation Needs:   . Lack of Transportation (Medical): Not on file  . Lack of Transportation (Non-Medical): Not on file  Physical Activity:   . Days of Exercise per Week: Not on file  . Minutes of Exercise per Session: Not on file  Stress:   . Feeling of Stress : Not on file  Social Connections:   . Frequency of Communication with Friends and Family: Not on file  . Frequency of Social Gatherings with Friends and Family: Not on file  . Attends Religious Services: Not on file  . Active Member of Clubs or Organizations: Not on file  . Attends Archivist Meetings: Not on file  . Marital Status: Not on file  Intimate Partner Violence:   .  Fear of Current or Ex-Partner: Not on file  . Emotionally Abused: Not on file  . Physically Abused: Not on file  . Sexually Abused: Not on file     Allergies  Allergen Reactions  . Shellfish Allergy Hives and Rash     Outpatient Medications Prior to Visit  Medication Sig Dispense Refill  . albuterol (PROVENTIL) (5 MG/ML) 0.5% nebulizer solution Take 0.5 mLs (2.5 mg total) by nebulization every 6 (six) hours as needed for wheezing or shortness of breath. 20 mL 12  . albuterol (VENTOLIN HFA) 108 (90 Base) MCG/ACT inhaler Inhale 1-2 puffs into the lungs every 6 (six) hours as needed for wheezing or shortness of breath. 90 g 2  . amLODipine (NORVASC) 5 MG tablet Take 1 tablet (5 mg total) by mouth daily. 30 tablet 1  . folic acid (FOLVITE) 1 MG tablet Take 1 tablet (1 mg total) by mouth daily. 30 tablet 1  .  SYMBICORT 160-4.5 MCG/ACT inhaler Inhale 2 puffs into the lungs in the morning and at bedtime. 1 Inhaler 3  . ARIPiprazole (ABILIFY) 5 MG tablet Take 1 tablet (5 mg total) by mouth daily. (Patient not taking: Reported on 06/19/2020) 30 tablet 0  . mirtazapine (REMERON) 15 MG tablet Take 1 tablet (15 mg total) by mouth at bedtime. (Patient not taking: Reported on 06/19/2020) 30 tablet 2  . nicotine (NICODERM CQ - DOSED IN MG/24 HR) 7 mg/24hr patch Place 1 patch (7 mg total) onto the skin daily. (Patient not taking: Reported on 06/19/2020) 28 patch 0  . thiamine 100 MG tablet Take 1 tablet (100 mg total) by mouth daily. (Patient not taking: Reported on 06/19/2020) 30 tablet 1   No facility-administered medications prior to visit.    Review of Systems Constitutional:   No  weight loss, night sweats,  Fevers, chills, fatigue, lassitude. HEENT:   No headaches,  Difficulty swallowing,  Tooth/dental problems,  Sore throat,                 sneezing, itching, ear ache, nasal congestion, post nasal drip,   CV:  No chest pain,  Orthopnea, PND, swelling in lower extremities, anasarca, dizziness, palpitations  GI   heartburn, indigestion, abdominal pain, nausea, vomiting, diarrhea, change in bowel habits, loss of appetite  Resp:  shortness of breath with exertion or at rest.  No excess mucus, no productive cough,  No non-productive cough,  No coughing up of blood.  No change in color of mucus.  No wheezing.  No chest wall deformity  Skin: no rash or lesions.  GU: no dysuria, change in color of urine, no urgency or frequency.  No flank pain.  MS:  No joint pain or swelling.  No decreased range of motion.  No back pain.  Psych:  No change in mood or affect. No depression or anxiety.  No memory loss.     Objective:   Physical Exam Vitals:   06/19/20 1000  BP: 115/79  Pulse: 75  Resp: 20  SpO2: 96%  Weight: 259 lb (117.5 kg)  Height: 6' 4.5" (1.943 m)    Gen: Pleasant, well-nourished, in no  distress,  normal affect  ENT: No lesions,  mouth clear,  oropharynx clear, no postnasal drip  Neck: No JVD, no TMG, no carotid bruits  Lungs: No use of accessory muscles, no dullness to percussion, clear without rales or rhonchi  Cardiovascular: RRR, heart sounds normal, no murmur or gallops, no peripheral edema  Abdomen: soft and NT, no  HSM,  BS normal  Musculoskeletal: No deformities, no cyanosis or clubbing  Neuro: alert, non focal  Skin: Warm, no lesions or rashes  On the left foot there is a severe callus forming at the base of the great toe  No results found.  Diagnostic Dominance: Right Left Main  Vessel is normal in caliber.  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  Left Circumflex  Vessel is normal in caliber. Vessel is angiographically normal.  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       Assessment & Plan:  I personally reviewed all images and lab data in the [facility] system as well as any outside material available during this office visit and agree with the  radiology impressions.   Coronary artery disease Mild coronary artery disease and history of NSTEMI  Begin aspirin 81 mg and atorvastatin 20 mg daily  Alcohol and tobacco use counseling given  Non-ST elevation (NSTEMI) myocardial infarction [facility]) Will refer to cardiology for further follow-up no evidence of active ischemia at this time  COPD (chronic obstructive pulmonary disease) ([place]) History of asthma and COPD overlap   we will refill the Symbicort and albuterol  Age-related nuclear cataract of right eye Patient does have insurance and is requesting referral for evaluation of change in vision as he has been told he has cataracts bilaterally  Referral made to [facility] ophthalmology  Cocaine use disorder, mild, abuse ([place]) Significant depression anxiety with use of cocaine patient currently now in counseling and also with mental  health care  Glaucoma History of glaucoma again referral to ophthalmology made  Homelessness Homelessness currently getting services at the [place]  Suicidal ideation No active suicidal ideation at this time under mental health care  Tobacco abuse Smoking cessation counseling given   Robert Chan was seen today for establish care.  Diagnoses and all orders for this visit:  Moderate alcohol use disorder ([place]) -     Vitamin B12 -     VITAMIN D 25 Hydroxy (Vit-D Deficiency, Fractures) -     Vitamin B1  Coronary artery disease of native artery of native heart with stable angina pectoris ([place]) -     Ambulatory referral to Cardiology  Homelessness  Simple chronic bronchitis (HCC) -     albuterol (VENTOLIN HFA) 108 (90 Base) MCG/ACT inhaler; Inhale 1-2 puffs into the lungs every 6 (six) hours as needed for wheezing or shortness of breath. -     SYMBICORT 160-4.5 MCG/ACT inhaler; Inhale 2 puffs into the lungs in the morning and at bedtime.  Severe recurrent major depression without psychotic features (HCC) -     mirtazapine (REMERON) 15 MG tablet; Take 1 tablet (15 mg total) by mouth at bedtime. -     ARIPiprazole (ABILIFY) 5 MG tablet; Take 1 tablet (5 mg total) by mouth daily.  Benign essential hypertension -     amLODipine (NORVASC) 5 MG tablet; Take 1 tablet (5 mg total) by mouth daily.  Non-ST elevation (NSTEMI) myocardial infarction [facility]) -     Ambulatory referral to Cardiology  Vision changes -     Ambulatory referral to Ophthalmology  Age-related nuclear cataract of right eye  Cocaine use disorder, mild, abuse ([place])  Primary angle closure glaucoma of both eyes, unspecified glaucoma stage, unspecified primary angle-closure glaucoma type  Suicidal ideation  Tobacco abuse  Other orders -     thiamine 100 MG tablet; Take 1 tablet (100 mg  total) by mouth daily. -     folic acid (FOLVITE) 1 MG tablet; Take 1 tablet (1 mg total) by mouth daily. -      albuterol (PROVENTIL) (5 MG/ML) 0.5% nebulizer solution; Take 0.5 mLs (2.5 mg total) by nebulization every 6 (six) hours as needed for wheezing or shortness of breath. -     atorvastatin (LIPITOR) 20 MG tablet; Take 1 tablet (20 mg total) by mouth daily. -     aspirin EC 81 MG tablet; Take 1 tablet (81 mg total) by mouth daily. Swallow whole.  Will check B12 levels

## 2020-06-19 NOTE — Patient Instructions (Signed)
All medications refilled  Start Aspirin daily.  Start atorvastitin daily  Labs today to check vitamin levels  Avoid alcohol and cocaine  Return Dr Joya Gaskins 6 weeks  A cardiology follow up visit will be made

## 2020-06-20 LAB — VITAMIN D 25 HYDROXY (VIT D DEFICIENCY, FRACTURES): Vit D, 25-Hydroxy: 11.9 ng/mL — ABNORMAL LOW (ref 30.0–100.0)

## 2020-06-20 LAB — VITAMIN B12: Vitamin B-12: 280 pg/mL (ref 232–1245)

## 2020-06-20 NOTE — Assessment & Plan Note (Addendum)
History of asthma and COPD overlap   we will refill the Symbicort and albuterol

## 2020-06-20 NOTE — Assessment & Plan Note (Signed)
Smoking cessation counseling given  

## 2020-06-20 NOTE — Assessment & Plan Note (Signed)
Mild coronary artery disease and history of NSTEMI  Begin aspirin 81 mg and atorvastatin 20 mg daily  Alcohol and tobacco use counseling given

## 2020-06-20 NOTE — Assessment & Plan Note (Signed)
Patient does have insurance and is requesting referral for evaluation of change in vision as he has been told he has cataracts bilaterally  Referral made to [facility] ophthalmology

## 2020-06-20 NOTE — Assessment & Plan Note (Signed)
History of glaucoma again referral to ophthalmology made

## 2020-06-20 NOTE — Assessment & Plan Note (Signed)
Homelessness currently getting services at the [facility]

## 2020-06-20 NOTE — Assessment & Plan Note (Signed)
No active suicidal ideation at this time under mental health care

## 2020-06-20 NOTE — Assessment & Plan Note (Signed)
Significant depression anxiety with use of cocaine patient currently now in counseling and also with mental health care

## 2020-06-20 NOTE — Assessment & Plan Note (Signed)
Will refer to cardiology for further follow-up no evidence of active ischemia at this time

## 2020-06-23 ENCOUNTER — Other Ambulatory Visit: Payer: Self-pay | Admitting: Critical Care Medicine

## 2020-06-23 DIAGNOSIS — E559 Vitamin D deficiency, unspecified: Secondary | ICD-10-CM | POA: Insufficient documentation

## 2020-06-23 MED ORDER — VITAMIN D (ERGOCALCIFEROL) 1.25 MG (50000 UNIT) PO CAPS: 50000.0000 [IU] | ORAL_CAPSULE | ORAL | 5 refills | Status: DC

## 2020-06-24 NOTE — [facility]regational Nurse Program (Signed)
  Dept: [facility] Nurse Program Note  Date of Encounter: 06/18/2020  Past Medical History: Past Medical History:  Diagnosis Date  . Asthma   . Cataract   . COPD (chronic obstructive pulmonary disease) ([place])   . Hypertension     Encounter Details:  CNP Questionnaire - 06/18/20 1600      Questionnaire   Patient Status Not Applicable    Race Black or African American    Location Patient Served At Not Applicable    Insurance Private Insurance;Medicare    Uninsured Not Applicable    Food No food insecurities    Housing/Utilities No permanent housing    Transportation Within past 12 months, lack of transportation negatively impacted life    Medication No medication insecurities    Medical Provider No    Referrals Primary Care Provider/Clinic;[facility]    ED Visit Averted Not Applicable    Life-Saving Intervention Made Not Applicable          client stopped by after dinner ,has appointment to establish care tomorrow with Dr. Viona Gilmore encouraged to keep appointment, states he is  Feeling a bit  of winded and stated   his inhalers were not holding him and the need for a review of his medications . Moving a little slower today ,no apparent distress,no shortness of breath noted  but states he can tell he is more winded.  Will follow up with client after he  Is seen for care.

## 2020-06-27 ENCOUNTER — Encounter: Payer: Self-pay | Admitting: Critical Care Medicine

## 2020-07-08 ENCOUNTER — Encounter (HOSPITAL_COMMUNITY): Payer: Self-pay | Admitting: Emergency Medicine

## 2020-07-08 ENCOUNTER — Emergency Department (HOSPITAL_COMMUNITY): Payer: Medicare HMO

## 2020-07-08 ENCOUNTER — Emergency Department (HOSPITAL_COMMUNITY)
Admission: EM | Admit: 2020-07-08 | Discharge: 2020-07-08 | Disposition: A | Discharge status: Home / Self Care | Payer: Medicare HMO | Attending: Emergency Medicine | Admitting: Emergency Medicine

## 2020-07-08 ENCOUNTER — Other Ambulatory Visit: Payer: Self-pay

## 2020-07-08 DIAGNOSIS — R42 Dizziness and giddiness: Secondary | ICD-10-CM | POA: Insufficient documentation

## 2020-07-08 DIAGNOSIS — R079 Chest pain, unspecified: Secondary | ICD-10-CM | POA: Diagnosis not present

## 2020-07-08 DIAGNOSIS — Z5321 Procedure and treatment not carried out due to patient leaving prior to being seen by health care provider: Secondary | ICD-10-CM | POA: Insufficient documentation

## 2020-07-08 DIAGNOSIS — R0602 Shortness of breath: Secondary | ICD-10-CM | POA: Insufficient documentation

## 2020-07-08 LAB — CBC
HCT: 40 % (ref 39.0–52.0)
Hemoglobin: 12.5 g/dL — ABNORMAL LOW (ref 13.0–17.0)
MCH: 29.4 pg (ref 26.0–34.0)
MCHC: 31.3 g/dL (ref 30.0–36.0)
MCV: 94.1 fL (ref 80.0–100.0)
Platelets: 235 10*3/uL (ref 150–400)
RBC: 4.25 MIL/uL (ref 4.22–5.81)
RDW: 12 % (ref 11.5–15.5)
WBC: 7.1 10*3/uL (ref 4.0–10.5)
nRBC: 0 % (ref 0.0–0.2)

## 2020-07-08 LAB — BASIC METABOLIC PANEL
Anion gap: 9 (ref 5–15)
BUN: 14 mg/dL (ref 8–23)
CO2: 21 mmol/L — ABNORMAL LOW (ref 22–32)
Calcium: 8.8 mg/dL — ABNORMAL LOW (ref 8.9–10.3)
Chloride: 107 mmol/L (ref 98–111)
Creatinine, Ser: 1.34 mg/dL — ABNORMAL HIGH (ref 0.61–1.24)
GFR calc Af Amer: >60 mL/min (ref >60)
GFR calc non Af Amer: 55 mL/min — ABNORMAL LOW (ref >60)
Glucose, Bld: 110 mg/dL — ABNORMAL HIGH (ref 70–99)
Potassium: 4.1 mmol/L (ref 3.5–5.1)
Sodium: 137 mmol/L (ref 135–145)

## 2020-07-08 LAB — TROPONIN I (HIGH SENSITIVITY): Troponin I (High Sensitivity): 10 ng/L (ref <18)

## 2020-07-08 NOTE — ED Triage Notes (Signed)
Pt endorses chest pressure since yesterday. Endorses SOB and dizziness.

## 2020-07-10 ENCOUNTER — Other Ambulatory Visit: Payer: Self-pay

## 2020-07-10 ENCOUNTER — Ambulatory Visit: Payer: Medicare HMO | Admitting: Physician Assistant

## 2020-07-10 VITALS — BP 123/82 | HR 73 | Temp 98.2°F | Resp 18 | Ht 76.0 in | Wt 253.0 lb

## 2020-07-10 DIAGNOSIS — R69 Illness, unspecified: Secondary | ICD-10-CM | POA: Diagnosis not present

## 2020-07-10 DIAGNOSIS — F332 Major depressive disorder, recurrent severe without psychotic features: Secondary | ICD-10-CM

## 2020-07-10 DIAGNOSIS — R5383 Other fatigue: Secondary | ICD-10-CM | POA: Diagnosis not present

## 2020-07-10 MED ORDER — BUPROPION HCL ER (XL) 150 MG PO TB24: 150.0000 mg | ORAL_TABLET | Freq: Every day | ORAL | 0 refills | Status: DC

## 2020-07-10 NOTE — Patient Instructions (Signed)
You will start Wellbutrin once daily, I recommend that you take it in the morning.  I will start a referral for you to be seen by psychiatry to help with medication management and counseling.  Please return to the mobile unit in 2 weeks for further review  Kennieth Rad, PA-C Physician Assistant [place] [email or web address]   Bupropion extended-release tablets (Depression/Mood Disorders) What is this medicine? BUPROPION (byoo PROE pee on) is used to treat depression. This medicine may be used for other purposes; ask your health care provider or pharmacist if you have questions. COMMON BRAND NAME(S): Aplenzin, Budeprion XL, Forfivo XL, Wellbutrin XL What should I tell my health care provider before I take this medicine? They need to know if you have any of these conditions:  an eating disorder, such as anorexia or bulimia  bipolar disorder or psychosis  diabetes or high blood sugar, treated with medication  glaucoma  head injury or brain tumor  heart disease, previous heart attack, or irregular heart beat  high blood pressure  kidney or liver disease  seizures (convulsions)  suicidal thoughts or a previous suicide attempt  Tourette's syndrome  weight loss  an unusual or allergic reaction to bupropion, other medicines, foods, dyes, or preservatives  breast-feeding  pregnant or trying to become pregnant How should I use this medicine? Take this medicine by mouth with a glass of water. Follow the directions on the prescription label. You can take it with or without food. If it upsets your stomach, take it with food. Do not crush, chew, or cut these tablets. This medicine is taken once daily at the same time each day. Do not take your medicine more often than directed. Do not stop taking this medicine suddenly except upon the advice of your doctor. Stopping this medicine too quickly may cause serious side effects  or your condition may worsen. A special MedGuide will be given to you by the pharmacist with each prescription and refill. Be sure to read this information carefully each time. Talk to your pediatrician regarding the use of this medicine in children. Special care may be needed. Overdosage: If you think you have taken too much of this medicine contact a poison control center or emergency room at once. NOTE: This medicine is only for you. Do not share this medicine with others. What if I miss a dose? If you miss a dose, skip the missed dose and take your next tablet at the regular time. Do not take double or extra doses. What may interact with this medicine? Do not take this medicine with any of the following medications:  linezolid  MAOIs like Azilect, Carbex, Eldepryl, Marplan, Nardil, and Parnate  methylene blue (injected into a vein)  other medicines that contain bupropion like Zyban This medicine may also interact with the following medications:  alcohol  certain medicines for anxiety or sleep  certain medicines for blood pressure like metoprolol, propranolol  certain medicines for depression or psychotic disturbances  certain medicines for HIV or AIDS like efavirenz, lopinavir, nelfinavir, ritonavir  certain medicines for irregular heart beat like propafenone, flecainide  certain medicines for Parkinson's disease like amantadine, levodopa  certain medicines for seizures like carbamazepine, phenytoin, phenobarbital  cimetidine  clopidogrel  cyclophosphamide  digoxin  furazolidone  isoniazid  nicotine  orphenadrine  procarbazine  steroid medicines like prednisone or cortisone  stimulant medicines for attention disorders, weight loss, or to stay awake  tamoxifen  theophylline  thiotepa  ticlopidine  tramadol  warfarin  This list may not describe all possible interactions. Give your health care provider a list of all the medicines, herbs,  non-prescription drugs, or dietary supplements you use. Also tell them if you smoke, drink alcohol, or use illegal drugs. Some items may interact with your medicine. What should I watch for while using this medicine? Tell your doctor if your symptoms do not get better or if they get worse. Visit your doctor or healthcare provider for regular checks on your progress. Because it may take several weeks to see the full effects of this medicine, it is important to continue your treatment as prescribed by your doctor. This medicine may cause serious skin reactions. They can happen weeks to months after starting the medicine. Contact your healthcare provider right away if you notice fevers or flu-like symptoms with a rash. The rash may be red or purple and then turn into blisters or peeling of the skin. Or, you might notice a red rash with swelling of the face, lips or lymph nodes in your neck or under your arms. Patients and their families should watch out for new or worsening thoughts of suicide or depression. Also watch out for sudden changes in feelings such as feeling anxious, agitated, panicky, irritable, hostile, aggressive, impulsive, severely restless, overly excited and hyperactive, or not being able to sleep. If this happens, especially at the beginning of treatment or after a change in dose, call your healthcare provider. Avoid alcoholic drinks while taking this medicine. Drinking large amounts of alcoholic beverages, using sleeping or anxiety medicines, or quickly stopping the use of these agents while taking this medicine may increase your risk for a seizure. Do not drive or use heavy machinery until you know how this medicine affects you. This medicine can impair your ability to perform these tasks. Do not take this medicine close to bedtime. It may prevent you from sleeping. Your mouth may get dry. Chewing sugarless gum or sucking hard candy, and drinking plenty of water may help. Contact your  doctor if the problem does not go away or is severe. The tablet shell for some brands of this medicine does not dissolve. This is normal. The tablet shell may appear whole in the stool. This is not a cause for concern. What side effects may I notice from receiving this medicine? Side effects that you should report to your doctor or health care professional as soon as possible:  allergic reactions like skin rash, itching or hives, swelling of the face, lips, or tongue  breathing problems  changes in vision  confusion  elevated mood, decreased need for sleep, racing thoughts, impulsive behavior  fast or irregular heartbeat  hallucinations, loss of contact with reality  increased blood pressure  rash, fever, and swollen lymph nodes  redness, blistering, peeling or loosening of the skin, including inside the mouth  seizures  suicidal thoughts or other mood changes  unusually weak or tired  vomiting Side effects that usually do not require medical attention (report to your doctor or health care professional if they continue or are bothersome):  constipation  headache  loss of appetite  nausea  tremors  weight loss This list may not describe all possible side effects. Call your doctor for medical advice about side effects. You may report side effects to FDA at 1-800-FDA-1088. Where should I keep my medicine? Keep out of the reach of children. Store at room temperature between 15 and 30 degrees C (59 and 86 degrees F). Throw away any unused medicine  after the expiration date. NOTE: This sheet is a summary. It may not cover all possible information. If you have questions about this medicine, talk to your doctor, pharmacist, or health care provider.  2020 Elsevier/Gold Standard (2019-01-11 13:45:31)

## 2020-07-10 NOTE — Progress Notes (Signed)
Established Patient Office Visit  Subjective:  Patient ID: Robert Chan, male    DOB: 19-Oct-1956  Age: 64 y.o. MRN: [id number]  CC:  Chief Complaint  Patient presents with  . Fatigue    HPI [name] reports that he has been feeling very sluggish, depressed for the past week.  Reports that he has been taking his Abilify as directed, does take it in the evening.  Reports that he went to the emergency department 2 days ago for evaluation, but did not stay due to the long wait.  Sleep is good, appetite is good.  Adamantly denies active plan to hurt himself, states that he has had thoughts of he would be better off dead for many years  PHQ9 SCORE ONLY 06/19/2020 02/28/2020 02/28/2020  PHQ-9 Total Score 20 15 15     Past Medical History:  Diagnosis Date  . Asthma   . Cataract   . COPD (chronic obstructive pulmonary disease) ([place])   . Hypertension     Past Surgical History:  Procedure Laterality Date  . APPENDECTOMY    . EYE SURGERY    . LEFT HEART CATH AND CORONARY ANGIOGRAPHY N/A 06/05/2020   Procedure: LEFT HEART CATH AND CORONARY ANGIOGRAPHY;  Surgeon: Nigel Mormon, MD;  Location: [facility];  Service: Cardiovascular;  Laterality: N/A;  . PROSTATE SURGERY      Family History  Problem Relation Age of Onset  . Hypertension Mother   . Diabetes Mother   . Hypertension Father   . Hypertension Sister     Social History   Socioeconomic History  . Marital status: Single    Spouse name: Not on file  . Number of children: Not on file  . Years of education: Not on file  . Highest education level: Not on file  Occupational History  . Not on file  Tobacco Use  . Smoking status: Current Some Day Smoker  . Smokeless tobacco: Never Used  Substance and Sexual Activity  . Alcohol use: Yes  . Drug use: No  . Sexual activity: Yes  Other Topics Concern  . Not on file  Social History Narrative  . Not on file   Social Determinants of Health    Financial Resource Strain:   . Difficulty of Paying Living Expenses: Not on file  Food Insecurity:   . Worried About Charity fundraiser in the Last Year: Not on file  . Ran Out of Food in the Last Year: Not on file  Transportation Needs:   . Lack of Transportation (Medical): Not on file  . Lack of Transportation (Non-Medical): Not on file  Physical Activity:   . Days of Exercise per Week: Not on file  . Minutes of Exercise per Session: Not on file  Stress:   . Feeling of Stress : Not on file  Social Connections:   . Frequency of Communication with Friends and Family: Not on file  . Frequency of Social Gatherings with Friends and Family: Not on file  . Attends Religious Services: Not on file  . Active Member of Clubs or Organizations: Not on file  . Attends Archivist Meetings: Not on file  . Marital Status: Not on file  Intimate Partner Violence:   . Fear of Current or Ex-Partner: Not on file  . Emotionally Abused: Not on file  . Physically Abused: Not on file  . Sexually Abused: Not on file    Outpatient Medications Prior to Visit  Medication Sig Dispense  Refill  . albuterol (PROVENTIL) (5 MG/ML) 0.5% nebulizer solution Take 0.5 mLs (2.5 mg total) by nebulization every 6 (six) hours as needed for wheezing or shortness of breath. 20 mL 12  . albuterol (VENTOLIN HFA) 108 (90 Base) MCG/ACT inhaler Inhale 1-2 puffs into the lungs every 6 (six) hours as needed for wheezing or shortness of breath. 90 g 2  . amLODipine (NORVASC) 5 MG tablet Take 1 tablet (5 mg total) by mouth daily. 30 tablet 1  . ARIPiprazole (ABILIFY) 5 MG tablet Take 1 tablet (5 mg total) by mouth daily. 30 tablet 0  . aspirin EC 81 MG tablet Take 1 tablet (81 mg total) by mouth daily. Swallow whole. 30 tablet 11  . atorvastatin (LIPITOR) 20 MG tablet Take 1 tablet (20 mg total) by mouth daily. 90 tablet 3  . folic acid (FOLVITE) 1 MG tablet Take 1 tablet (1 mg total) by mouth daily. 30 tablet 1  .  mirtazapine (REMERON) 15 MG tablet Take 1 tablet (15 mg total) by mouth at bedtime. 30 tablet 2  . SYMBICORT 160-4.5 MCG/ACT inhaler Inhale 2 puffs into the lungs in the morning and at bedtime. 10.2 g 11  . thiamine 100 MG tablet Take 1 tablet (100 mg total) by mouth daily. 30 tablet 1  . Vitamin D, Ergocalciferol, (DRISDOL) 1.25 MG (50000 UNIT) CAPS capsule Take 1 capsule (50,000 Units total) by mouth every 7 (seven) days. 5 capsule 5   No facility-administered medications prior to visit.    Allergies  Allergen Reactions  . Shellfish Allergy Hives and Rash    ROS Review of Systems  Constitutional: Positive for fatigue. Negative for chills and fever.  HENT: Negative.   Eyes: Negative.   Respiratory: Negative for shortness of breath and wheezing.   Cardiovascular: Negative for chest pain and palpitations.  Gastrointestinal: Negative.   Endocrine: Negative.   Genitourinary: Negative.   Musculoskeletal: Negative.   Skin: Negative.   Allergic/Immunologic: Negative.   Neurological: Negative for dizziness, syncope and headaches.  Hematological: Negative.   Psychiatric/Behavioral: Positive for dysphoric mood and suicidal ideas. Negative for hallucinations, self-injury and sleep disturbance.      Objective:    Physical Exam Vitals and nursing note reviewed.  Constitutional:      Appearance: Normal appearance.  HENT:     Head: Normocephalic and atraumatic.     Right Ear: External ear normal.     Left Ear: External ear normal.     Nose: Nose normal.     Mouth/Throat:     Mouth: Mucous membranes are moist.     Pharynx: Oropharynx is clear.  Eyes:     Extraocular Movements: Extraocular movements intact.     Conjunctiva/sclera: Conjunctivae normal.     Pupils: Pupils are equal, round, and reactive to light.  Cardiovascular:     Rate and Rhythm: Normal rate and regular rhythm.     Pulses: Normal pulses.     Heart sounds: Normal heart sounds.  Abdominal:     General: Abdomen  is flat.     Palpations: Abdomen is soft.  Musculoskeletal:        General: Normal range of motion.     Cervical back: Normal range of motion and neck supple.  Skin:    General: Skin is warm and dry.  Neurological:     General: No focal deficit present.     Mental Status: He is alert and oriented to person, place, and time.  Psychiatric:  Mood and Affect: Mood normal.        Behavior: Behavior normal.        Thought Content: Thought content normal.        Judgment: Judgment normal.     BP 123/82 (BP Location: Left Arm, Patient Position: Sitting, Cuff Size: Normal)   Pulse 73   Temp 98.2 F (36.8 C)   Resp 18   Ht 6\' 4"  (1.93 m)   Wt 253 lb (114.8 kg)   SpO2 98%   BMI 30.80 kg/m  Wt Readings from Last 3 Encounters:  07/10/20 253 lb (114.8 kg)  07/08/20 257 lb 15 oz (117 kg)  06/19/20 259 lb (117.5 kg)     Health Maintenance Due  Topic Date Due  . COVID-19 Vaccine (1) Never done  . TETANUS/TDAP  Never done  . COLON CANCER SCREENING ANNUAL FOBT  Never done  . PNA vac Low Risk Adult (1 of 2 - PCV13) Never done  . INFLUENZA VACCINE  Never done    There are no preventive care reminders to display for this patient.  Lab Results  Component Value Date   TSH 2.020 02/28/2020   Lab Results  Component Value Date   WBC 7.1 07/08/2020   HGB 12.5 (L) 07/08/2020   HCT 40.0 07/08/2020   MCV 94.1 07/08/2020   PLT 235 07/08/2020   Lab Results  Component Value Date   NA 137 07/08/2020   K 4.1 07/08/2020   CO2 21 (L) 07/08/2020   GLUCOSE 110 (H) 07/08/2020   BUN 14 07/08/2020   CREATININE 1.34 (H) 07/08/2020   BILITOT 1.2 05/02/2020   ALKPHOS 51 05/02/2020   AST 63 (H) 05/02/2020   ALT 54 (H) 05/02/2020   PROT 8.0 05/02/2020   ALBUMIN 4.2 05/02/2020   CALCIUM 8.8 (L) 07/08/2020   ANIONGAP 9 07/08/2020   Lab Results  Component Value Date   CHOL 187 09/25/2019   Lab Results  Component Value Date   HDL 59 09/25/2019   Lab Results  Component Value Date    LDLCALC 114 (H) 09/25/2019   Lab Results  Component Value Date   TRIG 69 09/25/2019   Lab Results  Component Value Date   CHOLHDL 3.2 09/25/2019   Lab Results  Component Value Date   HGBA1C 5.1 09/25/2019      Assessment & Plan:   Problem List Items Addressed This Visit      Other   Severe recurrent major depression without psychotic features ([place]) - Primary   Relevant Medications   buPROPion (WELLBUTRIN XL) 150 MG 24 hr tablet   Other Relevant Orders   Ambulatory referral to Psychiatry    Other Visit Diagnoses    Fatigue, unspecified type        1. Severe recurrent major depression without psychotic features [facility]) Patient given [facility], trial Wellbutrin, referral to psychiatry for medication management.  Follow-up in mobile unit in 2 weeks - buPROPion (WELLBUTRIN XL) 150 MG 24 hr tablet; Take 1 tablet (150 mg total) by mouth daily.  Dispense: 30 tablet; Refill: 0 - Ambulatory referral to Psychiatry  2. Fatigue, unspecified type Labs were completed during emergency room visit, troponin level negative, no other abnormalities on CBC or BMP to explain fatigue   I have reviewed the patient's medical history (PMH, PSH, Social History, Family History, Medications, and allergies) , and have been updated if relevant. I spent 30 minutes reviewing chart and  face to  face time with patient.      Meds ordered this encounter  Medications  . buPROPion (WELLBUTRIN XL) 150 MG 24 hr tablet    Sig: Take 1 tablet (150 mg total) by mouth daily.    Dispense:  30 tablet    Refill:  0    Order Specific Question:   Supervising Provider    Answer:   Elsie Stain [[id number]]    Follow-up: Return in about 2 weeks (around 07/24/2020).    Loraine Grip Mayers, PA-C

## 2020-07-16 ENCOUNTER — Other Ambulatory Visit: Payer: Self-pay

## 2020-07-16 ENCOUNTER — Ambulatory Visit: Payer: Medicare HMO | Attending: Critical Care Medicine | Admitting: Licensed Clinical Social Worker

## 2020-07-16 DIAGNOSIS — F332 Major depressive disorder, recurrent severe without psychotic features: Secondary | ICD-10-CM | POA: Diagnosis not present

## 2020-07-16 DIAGNOSIS — R69 Illness, unspecified: Secondary | ICD-10-CM | POA: Diagnosis not present

## 2020-07-16 NOTE — Congregational Nurse Program (Signed)
  Dept: [facility] Nurse Program Note  Date of Encounter: 07/16/2020  Past Medical History: Past Medical History:  Diagnosis Date  . Asthma   . Cataract   . COPD (chronic obstructive pulmonary disease) ([place])   . Hypertension     Encounter Details:  CNP Questionnaire - 07/16/20 1332      Questionnaire   Patient Status Not Applicable    Race Black or African American    Location Patient Served At [place]    Uninsured Not Applicable    Food No food insecurities    Housing/Utilities No permanent housing    Transportation No transportation needs    Interpersonal Safety Yes, feel physically and emotionally safe where you currently live    Medication Provided medication assistance    Medical Provider Yes    Referrals Primary Care Provider/Clinic;Medication Assistance;[facility]    ED Visit Averted Not Applicable    Life-Saving Intervention Made Not Applicable          Client  In ,needs help with medications ,reviewed medications and client has not picked up all of his medications ,called [facility] [address] will switch over Remeron  and Thiamine and client can pick up tomorrow. Doesn't have a nebulizer machine for liquid inhaler solution ,will check with pharmacy on that . Ask if client had eye appointment yet he said no ,nurse will follow up with [facility] opthalmology and check with [facility] on cardiology follow up. Follow weekly.

## 2020-07-23 ENCOUNTER — Telehealth: Payer: Self-pay

## 2020-07-23 NOTE — Congregational Nurse Program (Signed)
  Dept: [place] Nurse Program Note  Date of Encounter: 07/22/2020  Past Medical History: Past Medical History:  Diagnosis Date  . Asthma   . Cataract   . COPD (chronic obstructive pulmonary disease) ([place])   . Hypertension     Encounter Details:  CNP Questionnaire - 07/23/20 1506      Questionnaire   Patient Status Not Applicable    Race Black or African American    Location Patient Served At Not Applicable    Insurance Medicare;Private Insurance    Uninsured Not Applicable    Food No food insecurities    Housing/Utilities No permanent housing    Transportation No transportation needs    Interpersonal Safety Yes, feel physically and emotionally safe where you currently live    Medication No medication insecurities    Medical Provider Yes    Referrals Primary Care Provider/Clinic;[facility]    ED Visit Averted Not Applicable    Life-Saving Intervention Made Not Applicable          Client in ,[facility] [address] but needed information on medications ,nurse tabled bottles and printed out information sheets on his medications for his review . Counseled regarding taking medications . Reminded of follow up appointment on mobile van and follow up with Dr Viona Gilmore on 08-05-2020. Nurse had scheduled eye appointment date given to client 11-12-2020 at 2 pm [facility]  Ophthalmology  [phone]. Office has already mailed to him new client package.  Will remind client closer to date. Talked about fungus on top of foot suggested he check with pharmacist what over the counter cream to use or can check while on mobile van Thursday.    Follow weekly

## 2020-07-23 NOTE — Telephone Encounter (Signed)
TC to [facility] opthalmology  To schedule eye check up . Appointments are really booked ,earliest appointment available is January 12 @2  pm . Will let client know.

## 2020-07-24 ENCOUNTER — Ambulatory Visit: Payer: Medicare HMO | Admitting: Physician Assistant

## 2020-07-24 ENCOUNTER — Other Ambulatory Visit: Payer: Self-pay

## 2020-07-24 DIAGNOSIS — F332 Major depressive disorder, recurrent severe without psychotic features: Secondary | ICD-10-CM

## 2020-07-24 DIAGNOSIS — R69 Illness, unspecified: Secondary | ICD-10-CM | POA: Diagnosis not present

## 2020-07-24 MED ORDER — BUPROPION HCL ER (XL) 150 MG PO TB24: 150.0000 mg | ORAL_TABLET | Freq: Every day | ORAL | 2 refills | Status: DC

## 2020-07-24 NOTE — Patient Instructions (Signed)
Keep taking the Wellbutrin XL daily, great to see that it is already offering you some relief.  Please let us know if there is anything else we can do for you  Kennieth Rad, PA-C Physician Assistant [facility] [email or web address]   Bupropion extended-release tablets (Depression/Mood Disorders) What is this medicine? BUPROPION (byoo PROE pee on) is used to treat depression. This medicine may be used for other purposes; ask your health care provider or pharmacist if you have questions. COMMON BRAND NAME(S): Aplenzin, Budeprion XL, Forfivo XL, Wellbutrin XL What should I tell my health care provider before I take this medicine? They need to know if you have any of these conditions:  an eating disorder, such as anorexia or bulimia  bipolar disorder or psychosis  diabetes or high blood sugar, treated with medication  glaucoma  head injury or brain tumor  heart disease, previous heart attack, or irregular heart beat  high blood pressure  kidney or liver disease  seizures (convulsions)  suicidal thoughts or a previous suicide attempt  Tourette's syndrome  weight loss  an unusual or allergic reaction to bupropion, other medicines, foods, dyes, or preservatives  breast-feeding  pregnant or trying to become pregnant How should I use this medicine? Take this medicine by mouth with a glass of water. Follow the directions on the prescription label. You can take it with or without food. If it upsets your stomach, take it with food. Do not crush, chew, or cut these tablets. This medicine is taken once daily at the same time each day. Do not take your medicine more often than directed. Do not stop taking this medicine suddenly except upon the advice of your doctor. Stopping this medicine too quickly may cause serious side effects or your condition may worsen. A special MedGuide will be given to you by the pharmacist with each  prescription and refill. Be sure to read this information carefully each time. Talk to your pediatrician regarding the use of this medicine in children. Special care may be needed. Overdosage: If you think you have taken too much of this medicine contact a poison control center or emergency room at once. NOTE: This medicine is only for you. Do not share this medicine with others. What if I miss a dose? If you miss a dose, skip the missed dose and take your next tablet at the regular time. Do not take double or extra doses. What may interact with this medicine? Do not take this medicine with any of the following medications:  linezolid  MAOIs like Azilect, Carbex, Eldepryl, Marplan, Nardil, and Parnate  methylene blue (injected into a vein)  other medicines that contain bupropion like Zyban This medicine may also interact with the following medications:  alcohol  certain medicines for anxiety or sleep  certain medicines for blood pressure like metoprolol, propranolol  certain medicines for depression or psychotic disturbances  certain medicines for HIV or AIDS like efavirenz, lopinavir, nelfinavir, ritonavir  certain medicines for irregular heart beat like propafenone, flecainide  certain medicines for Parkinson's disease like amantadine, levodopa  certain medicines for seizures like carbamazepine, phenytoin, phenobarbital  cimetidine  clopidogrel  cyclophosphamide  digoxin  furazolidone  isoniazid  nicotine  orphenadrine  procarbazine  steroid medicines like prednisone or cortisone  stimulant medicines for attention disorders, weight loss, or to stay awake  tamoxifen  theophylline  thiotepa  ticlopidine  tramadol  warfarin This list may not describe all possible interactions. Give your health care provider a list of  all the medicines, herbs, non-prescription drugs, or dietary supplements you use. Also tell them if you smoke, drink alcohol, or use  illegal drugs. Some items may interact with your medicine. What should I watch for while using this medicine? Tell your doctor if your symptoms do not get better or if they get worse. Visit your doctor or healthcare provider for regular checks on your progress. Because it may take several weeks to see the full effects of this medicine, it is important to continue your treatment as prescribed by your doctor. This medicine may cause serious skin reactions. They can happen weeks to months after starting the medicine. Contact your healthcare provider right away if you notice fevers or flu-like symptoms with a rash. The rash may be red or purple and then turn into blisters or peeling of the skin. Or, you might notice a red rash with swelling of the face, lips or lymph nodes in your neck or under your arms. Patients and their families should watch out for new or worsening thoughts of suicide or depression. Also watch out for sudden changes in feelings such as feeling anxious, agitated, panicky, irritable, hostile, aggressive, impulsive, severely restless, overly excited and hyperactive, or not being able to sleep. If this happens, especially at the beginning of treatment or after a change in dose, call your healthcare provider. Avoid alcoholic drinks while taking this medicine. Drinking large amounts of alcoholic beverages, using sleeping or anxiety medicines, or quickly stopping the use of these agents while taking this medicine may increase your risk for a seizure. Do not drive or use heavy machinery until you know how this medicine affects you. This medicine can impair your ability to perform these tasks. Do not take this medicine close to bedtime. It may prevent you from sleeping. Your mouth may get dry. Chewing sugarless gum or sucking hard candy, and drinking plenty of water may help. Contact your doctor if the problem does not go away or is severe. The tablet shell for some brands of this medicine does not  dissolve. This is normal. The tablet shell may appear whole in the stool. This is not a cause for concern. What side effects may I notice from receiving this medicine? Side effects that you should report to your doctor or health care professional as soon as possible:  allergic reactions like skin rash, itching or hives, swelling of the face, lips, or tongue  breathing problems  changes in vision  confusion  elevated mood, decreased need for sleep, racing thoughts, impulsive behavior  fast or irregular heartbeat  hallucinations, loss of contact with reality  increased blood pressure  rash, fever, and swollen lymph nodes  redness, blistering, peeling or loosening of the skin, including inside the mouth  seizures  suicidal thoughts or other mood changes  unusually weak or tired  vomiting Side effects that usually do not require medical attention (report to your doctor or health care professional if they continue or are bothersome):  constipation  headache  loss of appetite  nausea  tremors  weight loss This list may not describe all possible side effects. Call your doctor for medical advice about side effects. You may report side effects to FDA at 1-800-FDA-1088. Where should I keep my medicine? Keep out of the reach of children. Store at room temperature between 15 and 30 degrees C (59 and 86 degrees F). Throw away any unused medicine after the expiration date. NOTE: This sheet is a summary. It may not cover all possible  information. If you have questions about this medicine, talk to your doctor, pharmacist, or health care provider.  2020 Elsevier/Gold Standard (2019-01-11 13:45:31)

## 2020-07-24 NOTE — Progress Notes (Signed)
Established Patient Office Visit  Subjective:  Patient ID: Robert Chan, male    DOB: May 01, 1956  Age: 64 y.o. MRN: [id number]  CC:  Chief Complaint  Patient presents with  . Follow-up    HPI Heidi Lemay reports that he did start the Wellbutrin, states that he has started to feel some relief.  Reports that today he did energy to go for a walk.  Denies any adverse effects no other concerns at this time.  Has upcoming appointment with primary care provider next week, has appointment to begin counseling next week as well.  Past Medical History:  Diagnosis Date  . Asthma   . Cataract   . COPD (chronic obstructive pulmonary disease) ([place])   . Hypertension     Past Surgical History:  Procedure Laterality Date  . APPENDECTOMY    . EYE SURGERY    . LEFT HEART CATH AND CORONARY ANGIOGRAPHY N/A 06/05/2020   Procedure: LEFT HEART CATH AND CORONARY ANGIOGRAPHY;  Surgeon: Nigel Mormon, MD;  Location: [facility];  Service: Cardiovascular;  Laterality: N/A;  . PROSTATE SURGERY      Family History  Problem Relation Age of Onset  . Hypertension Mother   . Diabetes Mother   . Hypertension Father   . Hypertension Sister     Social History   Socioeconomic History  . Marital status: Single    Spouse name: Not on file  . Number of children: Not on file  . Years of education: Not on file  . Highest education level: Not on file  Occupational History  . Not on file  Tobacco Use  . Smoking status: Current Some Day Smoker  . Smokeless tobacco: Never Used  Substance and Sexual Activity  . Alcohol use: Yes  . Drug use: No  . Sexual activity: Yes  Other Topics Concern  . Not on file  Social History Narrative  . Not on file   Social Determinants of Health   Financial Resource Strain:   . Difficulty of Paying Living Expenses: Not on file  Food Insecurity:   . Worried About Charity fundraiser in the Last Year: Not on file  . Ran Out of Food in the Last Year:  Not on file  Transportation Needs:   . Lack of Transportation (Medical): Not on file  . Lack of Transportation (Non-Medical): Not on file  Physical Activity:   . Days of Exercise per Week: Not on file  . Minutes of Exercise per Session: Not on file  Stress:   . Feeling of Stress : Not on file  Social Connections:   . Frequency of Communication with Friends and Family: Not on file  . Frequency of Social Gatherings with Friends and Family: Not on file  . Attends Religious Services: Not on file  . Active Member of Clubs or Organizations: Not on file  . Attends Archivist Meetings: Not on file  . Marital Status: Not on file  Intimate Partner Violence:   . Fear of Current or Ex-Partner: Not on file  . Emotionally Abused: Not on file  . Physically Abused: Not on file  . Sexually Abused: Not on file    Outpatient Medications Prior to Visit  Medication Sig Dispense Refill  . albuterol (PROVENTIL) (5 MG/ML) 0.5% nebulizer solution Take 0.5 mLs (2.5 mg total) by nebulization every 6 (six) hours as needed for wheezing or shortness of breath. 20 mL 12  . albuterol (VENTOLIN HFA) 108 (90 Base) MCG/ACT  inhaler Inhale 1-2 puffs into the lungs every 6 (six) hours as needed for wheezing or shortness of breath. 90 g 2  . amLODipine (NORVASC) 5 MG tablet Take 1 tablet (5 mg total) by mouth daily. 30 tablet 1  . ARIPiprazole (ABILIFY) 5 MG tablet Take 1 tablet (5 mg total) by mouth daily. 30 tablet 0  . aspirin EC 81 MG tablet Take 1 tablet (81 mg total) by mouth daily. Swallow whole. 30 tablet 11  . atorvastatin (LIPITOR) 20 MG tablet Take 1 tablet (20 mg total) by mouth daily. 90 tablet 3  . folic acid (FOLVITE) 1 MG tablet Take 1 tablet (1 mg total) by mouth daily. 30 tablet 1  . mirtazapine (REMERON) 15 MG tablet Take 1 tablet (15 mg total) by mouth at bedtime. 30 tablet 2  . SYMBICORT 160-4.5 MCG/ACT inhaler Inhale 2 puffs into the lungs in the morning and at bedtime. 10.2 g 11  .  thiamine 100 MG tablet Take 1 tablet (100 mg total) by mouth daily. 30 tablet 1  . Vitamin D, Ergocalciferol, (DRISDOL) 1.25 MG (50000 UNIT) CAPS capsule Take 1 capsule (50,000 Units total) by mouth every 7 (seven) days. 5 capsule 5  . buPROPion (WELLBUTRIN XL) 150 MG 24 hr tablet Take 1 tablet (150 mg total) by mouth daily. 30 tablet 0   No facility-administered medications prior to visit.    Allergies  Allergen Reactions  . Shellfish Allergy Hives and Rash    ROS Review of Systems  Constitutional: Negative.   HENT: Negative.   Eyes: Negative.   Respiratory: Negative.   Cardiovascular: Negative.   Gastrointestinal: Negative.   Endocrine: Negative.   Genitourinary: Negative.   Musculoskeletal: Negative.   Skin: Negative.   Allergic/Immunologic: Negative.   Neurological: Negative.   Hematological: Negative.   Psychiatric/Behavioral: Negative.       Objective:    Physical Exam Vitals and nursing note reviewed.  Constitutional:      Appearance: Normal appearance.  HENT:     Head: Normocephalic and atraumatic.     Right Ear: External ear normal.     Left Ear: External ear normal.     Nose: Nose normal.     Mouth/Throat:     Mouth: Mucous membranes are moist.     Pharynx: Oropharynx is clear.  Eyes:     Extraocular Movements: Extraocular movements intact.     Conjunctiva/sclera: Conjunctivae normal.     Pupils: Pupils are equal, round, and reactive to light.  Cardiovascular:     Rate and Rhythm: Normal rate and regular rhythm.     Pulses: Normal pulses.     Heart sounds: Normal heart sounds.  Pulmonary:     Effort: Pulmonary effort is normal.     Breath sounds: Normal breath sounds.  Musculoskeletal:        General: Normal range of motion.     Cervical back: Normal range of motion and neck supple.  Skin:    General: Skin is warm and dry.  Neurological:     General: No focal deficit present.     Mental Status: He is alert and oriented to person, place, and  time.  Psychiatric:        Mood and Affect: Mood normal.        Behavior: Behavior normal.        Thought Content: Thought content normal.        Judgment: Judgment normal.     BP 104/73 (BP Location: Left Arm,  Patient Position: Sitting, Cuff Size: Normal)   Pulse 84   Temp 98.7 F (37.1 C) (Oral)   Resp 18   HC 18" (45.7 cm)   SpO2 100%  Wt Readings from Last 3 Encounters:  07/10/20 253 lb (114.8 kg)  07/08/20 257 lb 15 oz (117 kg)  06/19/20 259 lb (117.5 kg)     Health Maintenance Due  Topic Date Due  . COVID-19 Vaccine (1) Never done  . TETANUS/TDAP  Never done  . COLON CANCER SCREENING ANNUAL FOBT  Never done  . PNA vac Low Risk Adult (1 of 2 - PCV13) Never done  . INFLUENZA VACCINE  Never done    There are no preventive care reminders to display for this patient.  Lab Results  Component Value Date   TSH 2.020 02/28/2020   Lab Results  Component Value Date   WBC 7.1 07/08/2020   HGB 12.5 (L) 07/08/2020   HCT 40.0 07/08/2020   MCV 94.1 07/08/2020   PLT 235 07/08/2020   Lab Results  Component Value Date   NA 137 07/08/2020   K 4.1 07/08/2020   CO2 21 (L) 07/08/2020   GLUCOSE 110 (H) 07/08/2020   BUN 14 07/08/2020   CREATININE 1.34 (H) 07/08/2020   BILITOT 1.2 05/02/2020   ALKPHOS 51 05/02/2020   AST 63 (H) 05/02/2020   ALT 54 (H) 05/02/2020   PROT 8.0 05/02/2020   ALBUMIN 4.2 05/02/2020   CALCIUM 8.8 (L) 07/08/2020   ANIONGAP 9 07/08/2020   Lab Results  Component Value Date   CHOL 187 09/25/2019   Lab Results  Component Value Date   HDL 59 09/25/2019   Lab Results  Component Value Date   LDLCALC 114 (H) 09/25/2019   Lab Results  Component Value Date   TRIG 69 09/25/2019   Lab Results  Component Value Date   CHOLHDL 3.2 09/25/2019   Lab Results  Component Value Date   HGBA1C 5.1 09/25/2019      Assessment & Plan:   Problem List Items Addressed This Visit      Other   Severe recurrent major depression without psychotic  features ([place])   Relevant Medications   buPROPion (WELLBUTRIN XL) 150 MG 24 hr tablet    1. Severe recurrent major depression without psychotic features ([place]) Continue current regimen, continue with CBT  - buPROPion (WELLBUTRIN XL) 150 MG 24 hr tablet; Take 1 tablet (150 mg total) by mouth daily.  Dispense: 90 tablet; Refill: 2   Meds ordered this encounter  Medications  . buPROPion (WELLBUTRIN XL) 150 MG 24 hr tablet    Sig: Take 1 tablet (150 mg total) by mouth daily.    Dispense:  90 tablet    Refill:  2    Order Specific Question:   Supervising Provider    Answer:   Elsie Stain [[id number]]    I have reviewed the patient's medical history (PMH, PSH, Social History, Family History, Medications, and allergies) , and have been updated if relevant. I spent 15 minutes reviewing chart and  face to face time with patient.    Follow-up: Return if symptoms worsen or fail to improve.    Loraine Grip Mayers, PA-C

## 2020-07-28 ENCOUNTER — Ambulatory Visit: Payer: Medicare HMO | Admitting: Internal Medicine

## 2020-07-30 NOTE — Congregational Nurse Program (Signed)
  Dept: [facility] Nurse Program Note  Date of Encounter: 07/30/2020  Past Medical History: Past Medical History:  Diagnosis Date  . Asthma   . Cataract   . COPD (chronic obstructive pulmonary disease) ([place])   . Hypertension     Encounter Details:  CNP Questionnaire - 07/30/20 1500      Questionnaire   Patient Status Not Applicable    Race Black or African American    Location Patient Served At [place] No food insecurities    Housing/Utilities No permanent housing    Transportation Provided transportation assistance (bus pass, taxi voucher, etc.);Yes, need transportation assistance    Interpersonal Safety Yes, feel physically and emotionally safe where you currently live    Medication No medication insecurities    Medical Provider Yes    Referrals Primary Care Provider/Clinic    ED Visit Averted Not Applicable    Life-Saving Intervention Made Not Applicable          brief viist ,introduced student SW to client that will be working with him. Client states he has an appointment at [facility]  tomorrow needs bus ticket given 1 ticket  Taking his medications  Follow weekly

## 2020-07-31 ENCOUNTER — Ambulatory Visit: Payer: Medicare HMO | Attending: Critical Care Medicine | Admitting: Licensed Clinical Social Worker

## 2020-07-31 DIAGNOSIS — F332 Major depressive disorder, recurrent severe without psychotic features: Secondary | ICD-10-CM

## 2020-08-05 ENCOUNTER — Ambulatory Visit: Payer: Medicare HMO | Admitting: Critical Care Medicine

## 2020-08-06 NOTE — Congregational Nurse Program (Signed)
  Dept: [facility] Nurse Program Note  Date of Encounter: 08/05/2020  Past Medical History: Past Medical History:  Diagnosis Date  . Asthma   . Cataract   . COPD (chronic obstructive pulmonary disease) ([place])   . Hypertension     Encounter Details:  CNP Questionnaire - 08/05/20 1530      Questionnaire   Do you give verbal consent to treat you today? Yes    Visit Setting Other    Location Patient Served At Not Applicable    Patient Status Homeless    Medical Provider Yes    Insurance Medicare    Intervention Educate;Counsel;Support    Housing/Utilities No permanent housing    Transportation Need transportation assistance    Referrals PCP - [place]    ED Visit Averted Yes          client states he was seen by a counselor at [facility] last week. Will see student Sw on tomorrow .Allowed client to sit and talk about his life and family members .Recently purchased a mini van in order to help his daughter get to work and to help him lok for housing. Received his housing voucher now needs to look for affordable  Housing as voucher will expire in December..Reminded of appointments taking medications  . May see PCP on mobile van this week . Follow weekly

## 2020-08-07 ENCOUNTER — Ambulatory Visit: Payer: Medicare HMO | Admitting: Internal Medicine

## 2020-08-12 NOTE — BH Specialist Note (Signed)
Integrated Behavioral Health Initial Visit  MRN: [id number] Name: Robert Chan  Number of [facility] Clinician visits:: 1/6 Session Start time: 2:00 PM session End time: 2:30 PM Total time: 30  Type of Service: [place] Interpretor:No. Interpretor Name and Language: N/A   SUBJECTIVE: Robert Chan is a 64 y.o. male accompanied by Self Patient was referred by Dr. Joya Gaskins for depression. Patient reports the following symptoms/concerns: Patient reports difficulty managing depression symptoms, including, low energy for approximately 2 to 3 weeks Duration of problem: 3 weeks; Severity of problem: severe  OBJECTIVE: Mood: Anxious and Affect: Appropriate Risk of harm to self or others: No plan to harm self or others Pt scored positive on phq9; however, denies thoughts of harming self or others  LIFE CONTEXT: Family and Social: Receives support from family.  Daughter and 5 granddaughters reside locally.  Patient has additional sisters and niece who reside in [place] School/Work: Patient receives insurance Self-Care: Patient participating in medication management through PCP Life Changes: Patient reports increase in feelings of low energy resulting in decreased mood  GOALS ADDRESSED: Patient will: 1. Increase knowledge and/or ability of: coping skills patient agreed to utilize healthy coping skills, such as, compliance with medications and going to gym every other day 2. Demonstrate ability to: Increase adequate support systems for patient/family patient agreed to utilize crisis intervention resources to strengthen support system  INTERVENTIONS: Interventions utilized: Solution-Focused Strategies, Behavioral Activation and Link to [employer]  Standardized Assessments completed: GAD-7 and PHQ 2&9  ASSESSMENT: Patient currently experiencing difficulty managing depression symptoms triggered by stress.  Patient reports limited  energy for approximately 3 weeks.  He denies suicidal or homicidal ideations.   Patient may benefit from therapy and continued medication management.  Patient was successful in identifying healthy coping skills and reports compliance with medications.  He was provided crisis intervention resources.  PLAN: 1. Follow up with behavioral health clinician on : 07/31/2020 2. Behavioral recommendations: Utilize strategies discussed and resources provided 3. Referral(s): [facility] (In Clinic) and [facility] (LME/Outside Clinic) 4. "From scale of 1-10, how likely are you to follow plan?":   Rebekah Chesterfield, LCSW 08/12/2020 7:07 AM

## 2020-08-13 NOTE — Congregational Nurse Program (Signed)
  Dept: [facility] Nurse Program Note  Date of Encounter: 08/05/2020  Past Medical History: Past Medical History:  Diagnosis Date  . Asthma   . Cataract   . COPD (chronic obstructive pulmonary disease) ([place])   . Hypertension     Encounter Details:  CNP Questionnaire - 08/05/20 1600      Questionnaire   Do you give verbal consent to treat you today? Yes    Visit Setting Other    Location Patient Served At Not Applicable    Patient Status Homeless    Medical Provider Yes    Insurance Medicare    Intervention Counsel;Support;Educate    Housing/Utilities No permanent housing    Referrals PCP - [place] [facility]    ED Visit Averted Yes         Client in at the end of the day states he was seen last week at [facility] by a counselor,doing okay today ,stil concerned about a rash on his foot ,dry skin ,frakey,itches  ,will seek care on mobile van on Thursday.Received a housing voucher ,looking for housing ,must find by December. Purchased him a mini Lucianne Lei and daughter is using to get to work. Marland Kitchen Allowed client to talk ,supportive ,will work with student SW regarding his depression and staying on track to get himself housed.  Follow as needed

## 2020-08-14 ENCOUNTER — Ambulatory Visit (HOSPITAL_COMMUNITY): Payer: Medicaid Other | Admitting: Psychiatry

## 2020-08-19 NOTE — Congregational Nurse Program (Signed)
  Dept: [facility] Nurse Program Note  Date of Encounter: 08/19/2020  Past Medical History: Past Medical History:  Diagnosis Date  . Asthma   . Cataract   . COPD (chronic obstructive pulmonary disease) ([place])   . Hypertension     Encounter Details:  CNP Questionnaire - 08/19/20 1635      Questionnaire   Do you give verbal consent to treat you today? Yes    Visit Setting Other    Location Patient Served At Not Applicable    Patient Status Homeless    Medical Provider Yes    Insurance Medicare    Intervention Support;Counsel    Housing/Utilities No permanent housing    Referrals PCP - [facility]    ED Visit Averted Yes         brief visit states looking for housing daily .Still concerns about rash on foot ,will check with mobile van this week ,states itching and some spread of rash.Complaints for several weeks ,has failed to check on over counter meds ,so encouraged to check on foot this week. Main concern now is to obtain housing so his voucher will not run out.  Supportive

## 2020-08-20 DIAGNOSIS — E669 Obesity, unspecified: Secondary | ICD-10-CM | POA: Diagnosis not present

## 2020-08-20 DIAGNOSIS — H547 Unspecified visual loss: Secondary | ICD-10-CM | POA: Diagnosis not present

## 2020-08-20 DIAGNOSIS — D649 Anemia, unspecified: Secondary | ICD-10-CM | POA: Diagnosis not present

## 2020-08-20 DIAGNOSIS — J449 Chronic obstructive pulmonary disease, unspecified: Secondary | ICD-10-CM | POA: Diagnosis not present

## 2020-08-20 DIAGNOSIS — R69 Illness, unspecified: Secondary | ICD-10-CM | POA: Diagnosis not present

## 2020-08-20 DIAGNOSIS — H269 Unspecified cataract: Secondary | ICD-10-CM | POA: Diagnosis not present

## 2020-08-20 DIAGNOSIS — K08409 Partial loss of teeth, unspecified cause, unspecified class: Secondary | ICD-10-CM | POA: Diagnosis not present

## 2020-08-20 DIAGNOSIS — E785 Hyperlipidemia, unspecified: Secondary | ICD-10-CM | POA: Diagnosis not present

## 2020-08-20 DIAGNOSIS — I10 Essential (primary) hypertension: Secondary | ICD-10-CM | POA: Diagnosis not present

## 2020-08-22 NOTE — BH Specialist Note (Signed)
LCSW met with patient for a follow up appointment. Pt reports decrease in depression and anxiety symptoms. Shared that family keeps him encouraged and grounded, although, they can be stressors at time. Validation and support provided. No additional concerns noted.   Pt was strongly encouraged to contact LCSW with any additional behavioral health and/or resource needs. Pt verbalized understanding.

## 2020-08-26 NOTE — Congregational Nurse Program (Signed)
  Dept: [place] Nurse Program Note  Date of Encounter: 08/26/2020  Past Medical History: Past Medical History:  Diagnosis Date  . Asthma   . Cataract   . COPD (chronic obstructive pulmonary disease) ([place])   . Hypertension     Encounter Details:  CNP Questionnaire - 08/26/20 1438      Questionnaire   Do you give verbal consent to treat you today? Yes    Visit Setting Other    Location Patient Served At Not Applicable    Patient Status Homeless    Medical Provider Yes    Insurance Medicare    Intervention Counsel;Support;Refer;Educate    Housing/Utilities No permanent housing    Referrals PCP - [place]    ED Visit Averted Yes          completed HUD information for housing ,hoping that something comes through very soon. Still complaints of rash on his feet,states itching and seems to be spreading . Both feet have bumpy ,dry skin rash ? Fungus infection??,counseled ,will try [facility] again this week ,or to drug store to ask pharmacist what to use. Looked up foot rashes and showed client pictures and several treatment ointments.Encouraged him to seek [facility] or see pharmacist for suggestions .client has been complaining of rash for several weeks but has never followed through.  Follow as needed

## 2020-08-27 NOTE — Congregational Nurse Program (Signed)
  Dept: [facility] Nurse Program Note  Date of Encounter: 08/27/2020  Past Medical History: Past Medical History:  Diagnosis Date  . Asthma   . Cataract   . COPD (chronic obstructive pulmonary disease) ([place])   . Hypertension     Encounter Details:  CNP Questionnaire - 08/27/20 1716      Questionnaire   Do you give verbal consent to treat you today? Yes    Visit Setting Other    Patient Status Homeless    Medical Provider Yes    Insurance Medicare    Intervention Counsel;Educate;Support    Housing/Utilities No permanent housing    Medication Have medication insecurities    Referrals Mobile Bus/Van - [place];PCP - [place]    ED Visit Averted Yes         Client needed assistance with medication refills ,[facility] ,except Abilify will need doctors re order for that one   Client will pick up medications and check with [facility] tomorrow regarding foot rash . SW student working with client Follow as needed

## 2020-09-06 ENCOUNTER — Inpatient Hospital Stay (HOSPITAL_COMMUNITY)
Admission: EM | Admit: 2020-09-06 | Discharge: 2020-09-08 | DRG: 191 | Disposition: A | Discharge status: Home / Self Care | Payer: Medicare HMO | Attending: Student in an Organized Health Care Education/Training Program | Admitting: Student in an Organized Health Care Education/Training Program

## 2020-09-06 ENCOUNTER — Emergency Department (HOSPITAL_COMMUNITY): Payer: Medicare HMO

## 2020-09-06 ENCOUNTER — Other Ambulatory Visit: Payer: Self-pay

## 2020-09-06 DIAGNOSIS — F17200 Nicotine dependence, unspecified, uncomplicated: Secondary | ICD-10-CM | POA: Diagnosis present

## 2020-09-06 DIAGNOSIS — I252 Old myocardial infarction: Secondary | ICD-10-CM

## 2020-09-06 DIAGNOSIS — Z20822 Contact with and (suspected) exposure to covid-19: Secondary | ICD-10-CM | POA: Diagnosis not present

## 2020-09-06 DIAGNOSIS — R062 Wheezing: Secondary | ICD-10-CM | POA: Diagnosis not present

## 2020-09-06 DIAGNOSIS — F1410 Cocaine abuse, uncomplicated: Secondary | ICD-10-CM | POA: Diagnosis present

## 2020-09-06 DIAGNOSIS — F1490 Cocaine use, unspecified, uncomplicated: Secondary | ICD-10-CM | POA: Diagnosis not present

## 2020-09-06 DIAGNOSIS — I2510 Atherosclerotic heart disease of native coronary artery without angina pectoris: Secondary | ICD-10-CM | POA: Diagnosis present

## 2020-09-06 DIAGNOSIS — N189 Chronic kidney disease, unspecified: Secondary | ICD-10-CM | POA: Diagnosis present

## 2020-09-06 DIAGNOSIS — Z9151 Personal history of suicidal behavior: Secondary | ICD-10-CM | POA: Diagnosis not present

## 2020-09-06 DIAGNOSIS — T380X5A Adverse effect of glucocorticoids and synthetic analogues, initial encounter: Secondary | ICD-10-CM | POA: Diagnosis not present

## 2020-09-06 DIAGNOSIS — Z6832 Body mass index (BMI) 32.0-32.9, adult: Secondary | ICD-10-CM | POA: Diagnosis not present

## 2020-09-06 DIAGNOSIS — I214 Non-ST elevation (NSTEMI) myocardial infarction: Secondary | ICD-10-CM | POA: Diagnosis not present

## 2020-09-06 DIAGNOSIS — R0602 Shortness of breath: Secondary | ICD-10-CM | POA: Diagnosis not present

## 2020-09-06 DIAGNOSIS — R778 Other specified abnormalities of plasma proteins: Secondary | ICD-10-CM | POA: Diagnosis not present

## 2020-09-06 DIAGNOSIS — R7989 Other specified abnormal findings of blood chemistry: Secondary | ICD-10-CM | POA: Diagnosis present

## 2020-09-06 DIAGNOSIS — Z66 Do not resuscitate: Secondary | ICD-10-CM | POA: Diagnosis not present

## 2020-09-06 DIAGNOSIS — Z79899 Other long term (current) drug therapy: Secondary | ICD-10-CM

## 2020-09-06 DIAGNOSIS — J9601 Acute respiratory failure with hypoxia: Secondary | ICD-10-CM | POA: Diagnosis not present

## 2020-09-06 DIAGNOSIS — Z833 Family history of diabetes mellitus: Secondary | ICD-10-CM

## 2020-09-06 DIAGNOSIS — F1910 Other psychoactive substance abuse, uncomplicated: Secondary | ICD-10-CM | POA: Diagnosis not present

## 2020-09-06 DIAGNOSIS — I129 Hypertensive chronic kidney disease with stage 1 through stage 4 chronic kidney disease, or unspecified chronic kidney disease: Secondary | ICD-10-CM | POA: Diagnosis present

## 2020-09-06 DIAGNOSIS — F332 Major depressive disorder, recurrent severe without psychotic features: Secondary | ICD-10-CM | POA: Diagnosis not present

## 2020-09-06 DIAGNOSIS — E669 Obesity, unspecified: Secondary | ICD-10-CM | POA: Diagnosis present

## 2020-09-06 DIAGNOSIS — F1010 Alcohol abuse, uncomplicated: Secondary | ICD-10-CM | POA: Diagnosis present

## 2020-09-06 DIAGNOSIS — R000 Tachycardia, unspecified: Secondary | ICD-10-CM | POA: Diagnosis not present

## 2020-09-06 DIAGNOSIS — F17210 Nicotine dependence, cigarettes, uncomplicated: Secondary | ICD-10-CM | POA: Diagnosis not present

## 2020-09-06 DIAGNOSIS — R45851 Suicidal ideations: Secondary | ICD-10-CM

## 2020-09-06 DIAGNOSIS — E559 Vitamin D deficiency, unspecified: Secondary | ICD-10-CM | POA: Diagnosis present

## 2020-09-06 DIAGNOSIS — F1421 Cocaine dependence, in remission: Secondary | ICD-10-CM | POA: Diagnosis present

## 2020-09-06 DIAGNOSIS — Z7982 Long term (current) use of aspirin: Secondary | ICD-10-CM | POA: Diagnosis not present

## 2020-09-06 DIAGNOSIS — Z720 Tobacco use: Secondary | ICD-10-CM | POA: Diagnosis not present

## 2020-09-06 DIAGNOSIS — J441 Chronic obstructive pulmonary disease with (acute) exacerbation: Secondary | ICD-10-CM | POA: Diagnosis not present

## 2020-09-06 DIAGNOSIS — Z8679 Personal history of other diseases of the circulatory system: Secondary | ICD-10-CM

## 2020-09-06 DIAGNOSIS — R69 Illness, unspecified: Secondary | ICD-10-CM | POA: Diagnosis not present

## 2020-09-06 DIAGNOSIS — I10 Essential (primary) hypertension: Secondary | ICD-10-CM | POA: Diagnosis not present

## 2020-09-06 DIAGNOSIS — Z7951 Long term (current) use of inhaled steroids: Secondary | ICD-10-CM

## 2020-09-06 DIAGNOSIS — R739 Hyperglycemia, unspecified: Secondary | ICD-10-CM | POA: Diagnosis not present

## 2020-09-06 DIAGNOSIS — Z8249 Family history of ischemic heart disease and other diseases of the circulatory system: Secondary | ICD-10-CM

## 2020-09-06 DIAGNOSIS — Z20828 Contact with and (suspected) exposure to other viral communicable diseases: Secondary | ICD-10-CM | POA: Diagnosis not present

## 2020-09-06 DIAGNOSIS — Z818 Family history of other mental and behavioral disorders: Secondary | ICD-10-CM

## 2020-09-06 LAB — CBC WITH DIFFERENTIAL/PLATELET
Abs Immature Granulocytes: 0.07 10*3/uL (ref 0.00–0.07)
Basophils Absolute: 0.1 10*3/uL (ref 0.0–0.1)
Basophils Relative: 1 %
Eosinophils Absolute: 0 10*3/uL (ref 0.0–0.5)
Eosinophils Relative: 0 %
HCT: 40.4 % (ref 39.0–52.0)
Hemoglobin: 12.3 g/dL — ABNORMAL LOW (ref 13.0–17.0)
Immature Granulocytes: 1 %
Lymphocytes Relative: 9 %
Lymphs Abs: 1.4 10*3/uL (ref 0.7–4.0)
MCH: 29.6 pg (ref 26.0–34.0)
MCHC: 30.4 g/dL (ref 30.0–36.0)
MCV: 97.3 fL (ref 80.0–100.0)
Monocytes Absolute: 1.1 10*3/uL — ABNORMAL HIGH (ref 0.1–1.0)
Monocytes Relative: 7 %
Neutro Abs: 12.5 10*3/uL — ABNORMAL HIGH (ref 1.7–7.7)
Neutrophils Relative %: 82 %
Platelets: 191 10*3/uL (ref 150–400)
RBC: 4.15 MIL/uL — ABNORMAL LOW (ref 4.22–5.81)
RDW: 12.5 % (ref 11.5–15.5)
WBC: 15.2 10*3/uL — ABNORMAL HIGH (ref 4.0–10.5)
nRBC: 0 % (ref 0.0–0.2)

## 2020-09-06 LAB — COMPREHENSIVE METABOLIC PANEL
ALT: 22 U/L (ref 0–44)
AST: 33 U/L (ref 15–41)
Albumin: 3.9 g/dL (ref 3.5–5.0)
Alkaline Phosphatase: 53 U/L (ref 38–126)
Anion gap: 14 (ref 5–15)
BUN: 15 mg/dL (ref 8–23)
CO2: 19 mmol/L — ABNORMAL LOW (ref 22–32)
Calcium: 8.2 mg/dL — ABNORMAL LOW (ref 8.9–10.3)
Chloride: 105 mmol/L (ref 98–111)
Creatinine, Ser: 1.28 mg/dL — ABNORMAL HIGH (ref 0.61–1.24)
GFR, Estimated: >60 mL/min (ref >60)
Glucose, Bld: 104 mg/dL — ABNORMAL HIGH (ref 70–99)
Potassium: 4.4 mmol/L (ref 3.5–5.1)
Sodium: 138 mmol/L (ref 135–145)
Total Bilirubin: 0.8 mg/dL (ref 0.3–1.2)
Total Protein: 7.1 g/dL (ref 6.5–8.1)

## 2020-09-06 LAB — RAPID URINE DRUG SCREEN, HOSP PERFORMED
Amphetamines: NOT DETECTED
Barbiturates: NOT DETECTED
Benzodiazepines: NOT DETECTED
Cocaine: POSITIVE — AB
Opiates: NOT DETECTED
Tetrahydrocannabinol: NOT DETECTED

## 2020-09-06 LAB — RESPIRATORY PANEL BY RT PCR (FLU A&B, COVID)
Influenza A by PCR: NEGATIVE
Influenza B by PCR: NEGATIVE
SARS Coronavirus 2 by RT PCR: NEGATIVE

## 2020-09-06 LAB — TROPONIN I (HIGH SENSITIVITY)
Troponin I (High Sensitivity): 271 ng/L (ref <18)
Troponin I (High Sensitivity): 430 ng/L (ref <18)
Troponin I (High Sensitivity): 157 ng/L (ref <18)

## 2020-09-06 LAB — GLUCOSE, CAPILLARY
Glucose-Capillary: 212 mg/dL — ABNORMAL HIGH (ref 70–99)
Glucose-Capillary: 192 mg/dL — ABNORMAL HIGH (ref 70–99)

## 2020-09-06 LAB — ETHANOL: Alcohol, Ethyl (B): <10 mg/dL (ref <10)

## 2020-09-06 LAB — BRAIN NATRIURETIC PEPTIDE: B Natriuretic Peptide: 92.6 pg/mL (ref 0.0–100.0)

## 2020-09-06 MED ORDER — ONDANSETRON HCL 4 MG/2ML IJ SOLN: 4.0000 mg | Freq: Four times a day (QID) | INTRAMUSCULAR | Status: DC | PRN

## 2020-09-06 MED ORDER — ALBUTEROL SULFATE (2.5 MG/3ML) 0.083% IN NEBU: 2.5000 mg | INHALATION_SOLUTION | RESPIRATORY_TRACT | Status: DC | PRN

## 2020-09-06 MED ORDER — SODIUM CHLORIDE 0.9% FLUSH
3.0000 mL | Freq: Two times a day (BID) | INTRAVENOUS | Status: DC
  Administered 2020-09-06 – 2020-09-07 (×2): 3 mL via INTRAVENOUS

## 2020-09-06 MED ORDER — AEROCHAMBER PLUS FLO-VU LARGE MISC
Status: AC
  Administered 2020-09-06: 1
  Filled 2020-09-06: qty 1

## 2020-09-06 MED ORDER — THIAMINE HCL 100 MG PO TABS
100.0000 mg | ORAL_TABLET | Freq: Every day | ORAL | Status: DC
  Administered 2020-09-06 – 2020-09-08 (×3): 100 mg via ORAL
  Filled 2020-09-06 (×3): qty 1

## 2020-09-06 MED ORDER — POLYETHYLENE GLYCOL 3350 17 G PO PACK: 17.0000 g | PACK | Freq: Every day | ORAL | Status: DC | PRN

## 2020-09-06 MED ORDER — LORAZEPAM 1 MG PO TABS: 1.0000 mg | ORAL_TABLET | ORAL | Status: DC | PRN

## 2020-09-06 MED ORDER — IPRATROPIUM BROMIDE HFA 17 MCG/ACT IN AERS
4.0000 | INHALATION_SPRAY | Freq: Once | RESPIRATORY_TRACT | Status: AC
  Administered 2020-09-06: 4 via RESPIRATORY_TRACT

## 2020-09-06 MED ORDER — ATORVASTATIN CALCIUM 10 MG PO TABS: 20.0000 mg | ORAL_TABLET | Freq: Every day | ORAL | Status: DC

## 2020-09-06 MED ORDER — ASPIRIN EC 81 MG PO TBEC
81.0000 mg | DELAYED_RELEASE_TABLET | Freq: Every day | ORAL | Status: DC
  Administered 2020-09-06 – 2020-09-08 (×3): 81 mg via ORAL
  Filled 2020-09-06 (×3): qty 1

## 2020-09-06 MED ORDER — PREDNISONE 20 MG PO TABS
40.0000 mg | ORAL_TABLET | Freq: Every day | ORAL | Status: DC
  Administered 2020-09-07 – 2020-09-08 (×2): 40 mg via ORAL
  Filled 2020-09-06 (×2): qty 2

## 2020-09-06 MED ORDER — ALBUTEROL SULFATE HFA 108 (90 BASE) MCG/ACT IN AERS
8.0000 | INHALATION_SPRAY | Freq: Once | RESPIRATORY_TRACT | Status: AC
  Administered 2020-09-06: 8 via RESPIRATORY_TRACT

## 2020-09-06 MED ORDER — ENOXAPARIN SODIUM 40 MG/0.4ML ~~LOC~~ SOLN
40.0000 mg | SUBCUTANEOUS | Status: DC
  Administered 2020-09-06 – 2020-09-08 (×3): 40 mg via SUBCUTANEOUS
  Filled 2020-09-06 (×3): qty 0.4

## 2020-09-06 MED ORDER — IPRATROPIUM-ALBUTEROL 0.5-2.5 (3) MG/3ML IN SOLN
3.0000 mL | Freq: Four times a day (QID) | RESPIRATORY_TRACT | Status: DC
  Administered 2020-09-06 (×2): 3 mL via RESPIRATORY_TRACT
  Filled 2020-09-06 (×2): qty 3

## 2020-09-06 MED ORDER — ALBUTEROL SULFATE HFA 108 (90 BASE) MCG/ACT IN AERS
8.0000 | INHALATION_SPRAY | Freq: Once | RESPIRATORY_TRACT | Status: AC
  Administered 2020-09-06: 8 via RESPIRATORY_TRACT
  Filled 2020-09-06: qty 6.7

## 2020-09-06 MED ORDER — ALBUTEROL (5 MG/ML) CONTINUOUS INHALATION SOLN
15.0000 mg/h | INHALATION_SOLUTION | Freq: Once | RESPIRATORY_TRACT | Status: AC
  Administered 2020-09-06: 15 mg/h via RESPIRATORY_TRACT
  Filled 2020-09-06: qty 20

## 2020-09-06 MED ORDER — NICOTINE 21 MG/24HR TD PT24
21.0000 mg | MEDICATED_PATCH | Freq: Every day | TRANSDERMAL | Status: DC
  Filled 2020-09-06 (×3): qty 1

## 2020-09-06 MED ORDER — FOLIC ACID 1 MG PO TABS
1.0000 mg | ORAL_TABLET | Freq: Every day | ORAL | Status: DC
  Administered 2020-09-06 – 2020-09-08 (×3): 1 mg via ORAL
  Filled 2020-09-06 (×4): qty 1

## 2020-09-06 MED ORDER — TRAZODONE HCL 50 MG PO TABS: 25.0000 mg | ORAL_TABLET | Freq: Every evening | ORAL | Status: DC | PRN

## 2020-09-06 MED ORDER — ARIPIPRAZOLE 5 MG PO TABS
5.0000 mg | ORAL_TABLET | Freq: Every day | ORAL | Status: DC
  Administered 2020-09-06 – 2020-09-08 (×3): 5 mg via ORAL
  Filled 2020-09-06 (×4): qty 1

## 2020-09-06 MED ORDER — AMLODIPINE BESYLATE 5 MG PO TABS
5.0000 mg | ORAL_TABLET | Freq: Once | ORAL | Status: AC
  Administered 2020-09-06: 5 mg via ORAL
  Filled 2020-09-06: qty 1

## 2020-09-06 MED ORDER — ADULT MULTIVITAMIN W/MINERALS CH
1.0000 | ORAL_TABLET | Freq: Every day | ORAL | Status: DC
  Administered 2020-09-06 – 2020-09-08 (×3): 1 via ORAL
  Filled 2020-09-06 (×3): qty 1

## 2020-09-06 MED ORDER — ALBUTEROL SULFATE HFA 108 (90 BASE) MCG/ACT IN AERS
1.0000 | INHALATION_SPRAY | Freq: Four times a day (QID) | RESPIRATORY_TRACT | Status: DC | PRN
  Filled 2020-09-06: qty 6.7

## 2020-09-06 MED ORDER — FLUTICASONE FUROATE-VILANTEROL 200-25 MCG/INH IN AEPB
1.0000 | INHALATION_SPRAY | Freq: Every day | RESPIRATORY_TRACT | Status: DC
  Administered 2020-09-07 – 2020-09-08 (×2): 1 via RESPIRATORY_TRACT
  Filled 2020-09-06: qty 28

## 2020-09-06 MED ORDER — ONDANSETRON HCL 4 MG PO TABS: 4.0000 mg | ORAL_TABLET | Freq: Four times a day (QID) | ORAL | Status: DC | PRN

## 2020-09-06 MED ORDER — LORAZEPAM 2 MG/ML IJ SOLN: 1.0000 mg | INTRAMUSCULAR | Status: DC | PRN

## 2020-09-06 MED ORDER — ACETAMINOPHEN 650 MG RE SUPP: 650.0000 mg | Freq: Four times a day (QID) | RECTAL | Status: DC | PRN

## 2020-09-06 MED ORDER — METHYLPREDNISOLONE SODIUM SUCC 125 MG IJ SOLR
125.0000 mg | Freq: Once | INTRAMUSCULAR | Status: AC
  Administered 2020-09-06: 125 mg via INTRAVENOUS
  Filled 2020-09-06: qty 2

## 2020-09-06 MED ORDER — BUPROPION HCL ER (XL) 150 MG PO TB24
150.0000 mg | ORAL_TABLET | Freq: Every day | ORAL | Status: DC
  Administered 2020-09-06 – 2020-09-08 (×3): 150 mg via ORAL
  Filled 2020-09-06 (×3): qty 1

## 2020-09-06 MED ORDER — MAGNESIUM SULFATE 2 GM/50ML IV SOLN
2.0000 g | Freq: Once | INTRAVENOUS | Status: AC
  Administered 2020-09-06: 2 g via INTRAVENOUS
  Filled 2020-09-06: qty 50

## 2020-09-06 MED ORDER — IPRATROPIUM BROMIDE HFA 17 MCG/ACT IN AERS
4.0000 | INHALATION_SPRAY | Freq: Once | RESPIRATORY_TRACT | Status: AC
  Administered 2020-09-06: 4 via RESPIRATORY_TRACT
  Filled 2020-09-06: qty 12.9

## 2020-09-06 MED ORDER — AMLODIPINE BESYLATE 5 MG PO TABS
5.0000 mg | ORAL_TABLET | Freq: Every day | ORAL | Status: DC
  Administered 2020-09-06: 5 mg via ORAL
  Filled 2020-09-06: qty 1

## 2020-09-06 MED ORDER — THIAMINE HCL 100 MG/ML IJ SOLN: 100.0000 mg | Freq: Every day | INTRAMUSCULAR | Status: DC

## 2020-09-06 MED ORDER — ALBUTEROL (5 MG/ML) CONTINUOUS INHALATION SOLN
INHALATION_SOLUTION | RESPIRATORY_TRACT | Status: AC
  Filled 2020-09-06: qty 20

## 2020-09-06 MED ORDER — ACETAMINOPHEN 325 MG PO TABS
650.0000 mg | ORAL_TABLET | Freq: Four times a day (QID) | ORAL | Status: DC | PRN
  Administered 2020-09-06: 650 mg via ORAL
  Filled 2020-09-06: qty 2

## 2020-09-06 MED ORDER — ATORVASTATIN CALCIUM 40 MG PO TABS
40.0000 mg | ORAL_TABLET | Freq: Every day | ORAL | Status: DC
  Administered 2020-09-06 – 2020-09-08 (×3): 40 mg via ORAL
  Filled 2020-09-06 (×3): qty 1

## 2020-09-06 MED ORDER — AMLODIPINE BESYLATE 10 MG PO TABS
10.0000 mg | ORAL_TABLET | Freq: Every day | ORAL | Status: DC
  Administered 2020-09-07 – 2020-09-08 (×2): 10 mg via ORAL
  Filled 2020-09-06 (×2): qty 1

## 2020-09-06 MED ORDER — INSULIN ASPART 100 UNIT/ML ~~LOC~~ SOLN
0.0000 [IU] | Freq: Three times a day (TID) | SUBCUTANEOUS | Status: DC
  Administered 2020-09-06: 3 [IU] via SUBCUTANEOUS
  Administered 2020-09-07 (×2): 1 [IU] via SUBCUTANEOUS

## 2020-09-06 NOTE — Progress Notes (Signed)
15 mg/hr CAT stopped after 1 hour.  Pt is not in distress at this time.  BS still have faint expiratory wheezes.  HR 98, RR18, pt resting comfortably at this time.  Duonebs q6 have been ordered.

## 2020-09-06 NOTE — TOC Initial Note (Signed)
Transition of Care [facility]) - Initial/Assessment Note    Patient Details  Name: Robert Chan MRN: [id number] Date of Birth: Mar 11, 1956  [facility] CM/SW Contact:    Verdell Carmine, RN Phone Number: 09/06/2020, 2:37 PM  Clinical Narrative:                 64 YO admitted with [facility] [place] has lived at the [facility] the last 10 months. Has children and grandchildren. Has a history of BH, SI, attempts. troponins elevated and climbing. History of stents.  Plan to admit as inpatient. Was [psotove for cocaine, did stop taking routine antidepression medications, will need to restart. No DME or HH needs assessed at this time CM wil follow for needs/discharge planning..   Expected Discharge Plan: Homeless Shelter Barriers to Discharge: Continued Medical Work up   Patient Goals and CMS Choice        Expected Discharge Plan and Services Expected Discharge Plan: Homeless Shelter   Discharge Planning Services: CM Consult                                          Prior Living Arrangements/Services     Patient language and need for interpreter reviewed:: Yes        Need for Family Participation in Patient Care: Yes (Comment) Care giver support system in place?: Yes (comment)   Criminal Activity/Legal Involvement Pertinent to Current Situation/Hospitalization: No - Comment as needed  Activities of Daily Living      Permission Sought/Granted                  Emotional Assessment       Orientation: : Oriented to Self, Oriented to Place, Oriented to  Time Alcohol / Substance Use: Illicit Drugs, Tobacco Use, Alcohol Use Psych Involvement: Yes (comment)  Admission diagnosis:  COPD exacerbation ([place]) [J44.1] Patient Active Problem List   Diagnosis Date Noted  . COPD exacerbation ([place]) 09/06/2020  . Elevated troponin 09/06/2020  . Polysubstance abuse ([place]) 09/06/2020  . Vitamin D deficiency 06/23/2020  . Coronary artery disease 06/19/2020  .  Homelessness 06/19/2020  . Suicidal ideation 06/05/2020  . Tobacco abuse 06/05/2020  . Non-ST elevation (NSTEMI) myocardial infarction ([place])   . Foot callus 12/12/2019  . Encounter for screening for COVID-19 12/12/2019  . Severe recurrent major depression without psychotic features ([place]) 09/24/2019  . Cocaine use disorder, mild, abuse ([place])   . Elevated serum creatinine 08/28/2019  . Moderate alcohol use disorder ([place]) 04/01/2018  . Age-related nuclear cataract of right eye 09/13/2016  . Benign essential hypertension 07/06/2016  . Glaucoma 07/06/2016  . Male erectile disorder 07/06/2016  . Asthma 10/18/2012  . COPD (chronic obstructive pulmonary disease) ([place]) 10/18/2012   PCP:  Elsie Stain, MD Pharmacy:   [id number] [place], [address] [place] [address] Phone: [phone] Fax: [phone]  [id number] [place], [address] [place] [address] Phone: [phone] Fax: [phone]     Social Determinants of Health (SDOH) Interventions    Readmission Risk Interventions No flowsheet data found.

## 2020-09-06 NOTE — ED Notes (Signed)
Pt does not want care/condition discussed with daughter if she contacts hospital.

## 2020-09-06 NOTE — Hospital Course (Addendum)
COPD exacerbation - solumedrol, mag, albuterol, atrovent  Troponinemia - 270, EKG unchanged, cardiac cath neg; cocaine use yesterday   Depression - suicidal ideation  Problems Addressed:  COPD exacerbation: Patient is admitted for COPD exacerbation and was treated with steroids and duoneb treatment PRN. He initially required 2L of O2 but is currently on room air. His breathing has improved - lungs sound clear to auscultation, and there is no longer expiratory wheezing. Leukocytosis is trending down (23.6 > 19.7). Patient remains afebrile and without productive sputum, therefore we did not initiate antibiotics. He should continue taking prednisone 40mg  (day 3/5), and resume his home medications (albuterol and Symbicort) after discharge. Patient is an ongoing tobacco user and was provided counseling for smoking cessation.       Severe recurrent major depression disorder with suicidal ideation: Patient with history of severe recurrent major depression with multiple suicidal attempts in the past presenting with ongoing suicidal ideation. He had discontinued his home medications for the past few days which were restarted on admission. He continues to endorse suicidal ideation with a plan. Psychiatry was consulted and recommends discharge to inpatient psychiatry unit for medication optimization.     Troponinemia 2/2 cocaine use: Patient presenting with chest pressure and noted to have elevated troponins on admission in setting of recent cocaine use. EKG was unchanged from prior admission on 06/04/2020. He recently had LHC (06/05/2020) for NSTEMI that was significant for nonobstructive CAD. Cardiology was consulted and did not recommend repeat cath or heparin gtt at this time. His troponins have trended down. Chest pressure has improved. Patient should continue home medications at discharge (Amlodipine and atorvastatin). Patient received counseling on substance abuse including cocaine use, and alcohol abuse.
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
## 2020-09-06 NOTE — Consult Note (Signed)
CARDIOLOGY CONSULT NOTE  Patient ID: Robert Chan MRN: [id number] DOB/AGE: Apr 12, 1956 64 y.o.  Admit date: 09/06/2020 Referring Physician  Gilles Chiquito, MD Primary Physician:  Elsie Stain, MD Reason for Consultation  Chest pain  Patient ID: Robert Chan, male    DOB: [birth year], 64 y.o.   MRN: [id number]  Chief Complaint  Patient presents with  . Shortness of Breath   HPI:    Robert Chan  is a 64 y.o. African-American male who was admitted to the hospital with positive cardiac markers and abnormal EKG on 06/04/2020 and underwent cardiac catheterization on 06/05/2020 revealing minimal disease in the proximal LAD and mid RCA otherwise no significant coronary artery disease, non-STEMI felt to be due to cocaine use.  Robert Chan presented again yesterday with worsening shortness of breath and chest pressure that started yesterday, describes chest pain as tightness and 10/10 in intensity.  This morning she has started to feel better, states that his dyspnea is better but still has cough and chest tightness is very minimal.  Robert Chan has been vaccinated with J&J vaccine agonist COVID-19 infection.  In view of abnormal serum troponin I was asked to see the patient.  Patient was sleepy but alert and was able to give me all the history.  Robert Chan last used cocaine yesterday.  His past medical history significant for COPD, hypertension and bronchial asthma/reactive airway disease.  Past Medical History:  Diagnosis Date  . Asthma   . Cataract   . COPD (chronic obstructive pulmonary disease) ([place])   . Hypertension    Past Surgical History:  Procedure Laterality Date  . APPENDECTOMY    . EYE SURGERY    . LEFT HEART CATH AND CORONARY ANGIOGRAPHY N/A 06/05/2020   Procedure: LEFT HEART CATH AND CORONARY ANGIOGRAPHY;  Surgeon: Nigel Mormon, MD;  Location: [facility];  Service: Cardiovascular;  Laterality: N/A;  . PROSTATE SURGERY     Family History  Problem Relation Age of Onset  .  Hypertension Mother   . Diabetes Mother   . Hypertension Father   . Hypertension Sister     Social History   Tobacco Use  . Smoking status: Current Some Day Smoker  . Smokeless tobacco: Never Used  Substance Use Topics  . Alcohol use: Yes    Marital Sttus: Single  ROS  Review of Systems  Constitutional: Positive for malaise/fatigue.  Cardiovascular: Positive for chest pain and dyspnea on exertion.  Respiratory: Positive for cough.   Neurological: Positive for headaches.  Psychiatric/Behavioral: Positive for depression.  All other systems reviewed and are negative.  Objective   Vitals with BMI 09/06/2020 09/06/2020 09/06/2020  Height - - -  Weight - - -  BMI - - -  Systolic 572 620 355  Diastolic 91 79 90  Pulse 99 108 -  Some encounter information is confidential and restricted. Go to Review Flowsheets activity to see all data.    Blood pressure (!) 149/91, pulse 99, temperature 98 F (36.7 C), temperature source Oral, resp. rate (!) 24, SpO2 96 %.    Physical Exam Constitutional:      General: Robert Chan is not in acute distress.    Appearance: Robert Chan is obese. Robert Chan is ill-appearing.  HENT:     Mouth/Throat:     Mouth: Mucous membranes are moist.  Eyes:     Extraocular Movements: Extraocular movements intact.  Cardiovascular:     Rate and Rhythm: Normal rate and regular rhythm.     Pulses: Intact distal pulses.  Heart sounds: Normal heart sounds. No murmur heard.  No gallop.      Comments: No leg edema, no JVD. Pulmonary:     Effort: Pulmonary effort is normal.     Breath sounds: Normal breath sounds.  Abdominal:     General: Bowel sounds are normal.     Palpations: Abdomen is soft.  Musculoskeletal:     Cervical back: Normal range of motion.  Skin:    General: Skin is warm and dry.     Capillary Refill: Capillary refill takes less than 2 seconds.  Neurological:     General: No focal deficit present.     Mental Status: Robert Chan is alert and oriented to person, place, and  time.    Laboratory examination:   Recent Labs    06/04/20 1201 06/04/20 1201 06/05/20 0638 07/08/20 0908 09/06/20 0756  NA 136   < > 138 137 138  K 4.1   < > 4.2 4.1 4.4  CL 102   < > 104 107 105  CO2 21*   < > 23 21* 19*  GLUCOSE 85   < > 87 110* 104*  BUN 11   < > 14 14 15   CREATININE 1.33*   < > 1.36* 1.34* 1.28*  CALCIUM 9.2   < > 8.7* 8.8* 8.2*  GFRNONAA 56*   < > 54* 55* >60  GFRAA >60  --  >60 >60  --    < > = values in this interval not displayed.   CrCl cannot be calculated (Unknown ideal weight.).  CMP Latest Ref Rng & Units 09/06/2020 07/08/2020 06/05/2020  Glucose 70 - 99 mg/dL 104(H) 110(H) 87  BUN 8 - 23 mg/dL 15 14 14   Creatinine 0.61 - 1.24 mg/dL 1.28(H) 1.34(H) 1.36(H)  Sodium 135 - 145 mmol/L 138 137 138  Potassium 3.5 - 5.1 mmol/L 4.4 4.1 4.2  Chloride 98 - 111 mmol/L 105 107 104  CO2 22 - 32 mmol/L 19(L) 21(L) 23  Calcium 8.9 - 10.3 mg/dL 8.2(L) 8.8(L) 8.7(L)  Total Protein 6.5 - 8.1 g/dL 7.1 - -  Total Bilirubin 0.3 - 1.2 mg/dL 0.8 - -  Alkaline Phos 38 - 126 U/L 53 - -  AST 15 - 41 U/L 33 - -  ALT 0 - 44 U/L 22 - -   CBC Latest Ref Rng & Units 09/06/2020 07/08/2020 06/04/2020  WBC 4.0 - 10.5 K/uL 15.2(H) 7.1 13.9(H)  Hemoglobin 13.0 - 17.0 g/dL 12.3(L) 12.5(L) 13.4  Hematocrit 39 - 52 % 40.4 40.0 41.9  Platelets 150 - 400 K/uL 191 235 259   Lipid Panel Recent Labs    09/25/19 0628  CHOL 187  TRIG 69  LDLCALC 114*  VLDL 14  HDL 59  CHOLHDL 3.2    HEMOGLOBIN A1C Lab Results  Component Value Date   HGBA1C 5.1 09/25/2019   MPG 99.67 09/25/2019   TSH Recent Labs    09/25/19 0628 02/28/20 1014  TSH 2.103 2.020   BNP (last 3 results) Recent Labs    05/02/20 0658 09/06/20 0757  BNP 45.8 92.6   10:12  (09/06/20) 07:56  (09/06/20) 2 mo ago  (07/08/20) 3 mo ago  (06/04/20) 3 mo ago  (06/04/20) 4 mo ago  (05/02/20) 4 mo ago  (05/02/20)  Troponin I (High Sensitivity) <18 ng/L 430High Panic  271High Panic CM  10      Medications and  allergies   Allergies  Allergen Reactions  . Shellfish Allergy Hives and Rash  No outpatient medications have been marked as taking for the 09/06/20 encounter [facility] Encounter).   No current facility-administered medications on file prior to encounter.   Current Outpatient Medications on File Prior to Encounter  Medication Sig Dispense Refill  . albuterol (PROVENTIL) (5 MG/ML) 0.5% nebulizer solution Take 0.5 mLs (2.5 mg total) by nebulization every 6 (six) hours as needed for wheezing or shortness of breath. 20 mL 12  . albuterol (VENTOLIN HFA) 108 (90 Base) MCG/ACT inhaler Inhale 1-2 puffs into the lungs every 6 (six) hours as needed for wheezing or shortness of breath. 90 g 2  . amLODipine (NORVASC) 5 MG tablet Take 1 tablet (5 mg total) by mouth daily. 30 tablet 1  . ARIPiprazole (ABILIFY) 5 MG tablet Take 1 tablet (5 mg total) by mouth daily. 30 tablet 0  . aspirin EC 81 MG tablet Take 1 tablet (81 mg total) by mouth daily. Swallow whole. 30 tablet 11  . atorvastatin (LIPITOR) 20 MG tablet Take 1 tablet (20 mg total) by mouth daily. 90 tablet 3  . buPROPion (WELLBUTRIN XL) 150 MG 24 hr tablet Take 1 tablet (150 mg total) by mouth daily. 90 tablet 2  . folic acid (FOLVITE) 1 MG tablet Take 1 tablet (1 mg total) by mouth daily. 30 tablet 1  . mirtazapine (REMERON) 15 MG tablet Take 1 tablet (15 mg total) by mouth at bedtime. 30 tablet 2  . SYMBICORT 160-4.5 MCG/ACT inhaler Inhale 2 puffs into the lungs in the morning and at bedtime. 10.2 g 11  . thiamine 100 MG tablet Take 1 tablet (100 mg total) by mouth daily. 30 tablet 1  . Vitamin D, Ergocalciferol, (DRISDOL) 1.25 MG (50000 UNIT) CAPS capsule Take 1 capsule (50,000 Units total) by mouth every 7 (seven) days. 5 capsule 5    Scheduled Meds: . amLODipine  5 mg Oral Daily  . ARIPiprazole  5 mg Oral Daily  . aspirin EC  81 mg Oral Daily  . atorvastatin  40 mg Oral Daily  . buPROPion  150 mg Oral Daily  . enoxaparin (LOVENOX)  injection  40 mg Subcutaneous Q24H  . fluticasone furoate-vilanterol  1 puff Inhalation Daily  . folic acid  1 mg Oral Daily  . insulin aspart  0-9 Units Subcutaneous TID WC  . ipratropium-albuterol  3 mL Nebulization Q6H  . multivitamin with minerals  1 tablet Oral Daily  . nicotine  21 mg Transdermal Daily  . [START ON 09/07/2020] predniSONE  40 mg Oral Q breakfast  . sodium chloride flush  3 mL Intravenous Q12H  . thiamine  100 mg Oral Daily   Or  . thiamine  100 mg Intravenous Daily   Continuous Infusions: PRN Meds:.acetaminophen **OR** acetaminophen, albuterol, ondansetron **OR** ondansetron (ZOFRAN) IV, polyethylene glycol, traZODone   No intake/output data recorded. Total I/O In: 3 [I.V.:3] Out: -     Radiology:   [facility] Chest Port 1 View  Result Date: 09/06/2020 CLINICAL DATA:  Shortness of breath. EXAM: PORTABLE CHEST 1 VIEW COMPARISON:  Chest radiographs 07/08/2020. FINDINGS: Heart size within normal limits. No appreciable airspace consolidation or pulmonary edema. No evidence of pleural effusion or pneumothorax. No acute bony abnormality identified. IMPRESSION: No evidence of acute cardiopulmonary abnormality. Electronically Signed   By: Kellie Simmering DO   On: 09/06/2020 08:11    Cardiac Studies:   Coronary angiogram 06/05/2020: LM: Normal LAD: Prox 30% stenosis. Mid LAD myocardial bridge        Slow flow improved with IC  NTG LCx: Normal RCA: Mid 20% disease  Mild nonobstructive CAD Chest pain and trop elevation due to cocaine induced vasospasm  EKG:  EKG 09/06/2020: Sinus tachycardia at rate of 102 bpm, normal axis, poor R wave progression, probably normal variant.  No evidence of ischemia.  Assessment   1.  NSTEMI most probably related to coronary spasm from cocaine use 2.  Shortness of breath and dyspnea on exertion related to underlying COPD, ongoing tobacco use disorder, no clinical evidence of heart failure BNP is normal as well. 3.  COPD, patient has  diffuse scattered rhonchi. 4.  Primary hypertension 5.  Substance use/illicit drug use  Medications Discontinued During This Encounter  Medication Reason  . albuterol (VENTOLIN) (5 MG/ML) 0.5% continuous inhalation solution Returned to ADS  . albuterol (VENTOLIN) (5 MG/ML) 0.5% continuous inhalation solution Returned to ADS  . albuterol (PROVENTIL) (2.5 MG/3ML) 0.083% nebulizer solution 2.5 mg   . atorvastatin (LIPITOR) tablet 20 mg   . LORazepam (ATIVAN) tablet 1-4 mg   . LORazepam (ATIVAN) injection 1-4 mg     Recommendations:   Robert Chan  is a 64 y.o. African-American male who was admitted to the hospital with positive cardiac markers and abnormal EKG on 06/04/2020 and underwent cardiac catheterization on 06/05/2020 revealing minimal disease in the proximal LAD and mid RCA otherwise no significant coronary artery disease, non-STEMI felt to be due to cocaine use.  Robert Chan presented again yesterday with worsening shortness of breath and chest pressure that started yesterday, describes chest pain as tightness and 10/10 in intensity.  This morning she has started to feel better, states that his dyspnea is better but still has cough and chest tightness is very minimal.  Robert Chan has been vaccinated with J&J vaccine agonist COVID-19 infection.  -I discussed with the house staff regarding increasing amlodipine to 10 mg daily for hypertension and also possibly to prevent coronary vasospasm.  Robert Chan will also probably benefit from being on an ARB in view of hypertension.  Robert Chan does need an echocardiogram to evaluate his LV systolic function as it was not performed previously.  No indication for intravenous heparin or cardiac catheterization.  I reviewed with the patient the findings of abnormal serum troponin and potential complications including development of nonischemic cardiomyopathy in detail.  Smoking cessation was also discussed in detail and effects of smoking on cardiovascular disease discussed additionally  for 10 minutes.  Counseling for substance use was also performed.  Fortunately there is no clinical evidence of heart failure, his dyspnea and cough is related to underlying COPD.  Please call if questions.   Adrian Prows, MD, [facility] 09/06/2020, 2:51 PM Office: [phone]
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
## 2020-09-06 NOTE — ED Provider Notes (Signed)
[facility] EMER[facility]ENC[facility] DEPARTMENT Provider Note   CSN: [id number] Arrival date & time: 09/06/20  4481     History Chief Complaint  Patient presents with  . Shortness of Breath    Robert Chan is a 64 y.o. male.  Patient is a 64 year old male with a history of hypertension, COPD, ongoing tobacco use, polysubstance abuse, NSTEMI, and chronic major depression  who is presenting today with complaint of shortness of breath.  Patient reports that he had some mild shortness of breath yesterday but his inhalers seem to help however in the middle of the night he woke up and was extremely short of breath.  He reports the inhalers did not help much and he is having tightness in his chest.  He has had a cough that has been mostly nonproductive and denies any recent cold symptoms such as fever, congestion or chills.  He has noted over the last 1 to 2 weeks some mild dyspnea on exertion and reports that in the past he has had a history of CHF even though it is not documented but does not take any fluid pills.  He has not noticed any new swelling in his lower extremities.  He has no abdominal pain, vomiting or diarrhea.  He reports he does take medication for his high blood pressure.  Patient called paramedics tonight and in route was given a breathing treatment which he reports did make him feel little bit better.  Pt also reports he is suidical and if left here today would overdose on whatever pills he could find.  The history is provided by the patient.       Past Medical History:  Diagnosis Date  . Asthma   . Cataract   . COPD (chronic obstructive pulmonary disease) ([place])   . Hypertension     Patient Active Problem List   Diagnosis Date Noted  . Vitamin D deficiency 06/23/2020  . Coronary artery disease 06/19/2020  . Homelessness 06/19/2020  . Suicidal ideation 06/05/2020  . Tobacco abuse 06/05/2020  . Non-ST elevation (NSTEMI) myocardial infarction ([place])   . Foot  callus 12/12/2019  . Encounter for screening for COVID-19 12/12/2019  . Severe recurrent major depression without psychotic features ([place]) 09/24/2019  . Cocaine use disorder, mild, abuse ([place])   . Elevated serum creatinine 08/28/2019  . Moderate alcohol use disorder ([place]) 04/01/2018  . Age-related nuclear cataract of right eye 09/13/2016  . Benign essential hypertension 07/06/2016  . Glaucoma 07/06/2016  . Male erectile disorder 07/06/2016  . Asthma 10/18/2012  . COPD (chronic obstructive pulmonary disease) ([place]) 10/18/2012    Past Surgical History:  Procedure Laterality Date  . APPENDECTOMY    . EYE SURGERY    . LEFT HEART CATH AND CORONARY ANGIOGRAPHY N/A 06/05/2020   Procedure: LEFT HEART CATH AND CORONARY ANGIOGRAPHY;  Surgeon: Nigel Mormon, MD;  Location: [facility];  Service: Cardiovascular;  Laterality: N/A;  . PROSTATE SURGERY         Family History  Problem Relation Age of Onset  . Hypertension Mother   . Diabetes Mother   . Hypertension Father   . Hypertension Sister     Social History   Tobacco Use  . Smoking status: Current Some Day Smoker  . Smokeless tobacco: Never Used  Substance Use Topics  . Alcohol use: Yes  . Drug use: No    Home Medications Prior to Admission medications   Medication Sig Start Date End Date Taking? Authorizing Provider  albuterol (PROVENTIL) (5 MG/ML) 0.5% nebulizer solution Take 0.5 mLs (2.5 mg total) by nebulization every 6 (six) hours as needed for wheezing or shortness of breath. 06/19/20   Elsie Stain, MD  albuterol (VENTOLIN HFA) 108 (90 Base) MCG/ACT inhaler Inhale 1-2 puffs into the lungs every 6 (six) hours as needed for wheezing or shortness of breath. 06/19/20   Elsie Stain, MD  amLODipine (NORVASC) 5 MG tablet Take 1 tablet (5 mg total) by mouth daily. 06/19/20   Elsie Stain, MD  ARIPiprazole (ABILIFY) 5 MG tablet Take 1 tablet (5 mg total) by mouth daily. 06/19/20 07/19/20  Elsie Stain,  MD  aspirin EC 81 MG tablet Take 1 tablet (81 mg total) by mouth daily. Swallow whole. 06/19/20   Elsie Stain, MD  atorvastatin (LIPITOR) 20 MG tablet Take 1 tablet (20 mg total) by mouth daily. 06/19/20   Elsie Stain, MD  buPROPion (WELLBUTRIN XL) 150 MG 24 hr tablet Take 1 tablet (150 mg total) by mouth daily. 07/24/20 04/20/21  Mayers, Cari S, PA-C  folic acid (FOLVITE) 1 MG tablet Take 1 tablet (1 mg total) by mouth daily. 06/19/20   Elsie Stain, MD  mirtazapine (REMERON) 15 MG tablet Take 1 tablet (15 mg total) by mouth at bedtime. 06/19/20 09/17/20  Elsie Stain, MD  SYMBICORT 160-4.5 MCG/ACT inhaler Inhale 2 puffs into the lungs in the morning and at bedtime. 06/19/20   Elsie Stain, MD  thiamine 100 MG tablet Take 1 tablet (100 mg total) by mouth daily. 06/19/20   Elsie Stain, MD  Vitamin D, Ergocalciferol, (DRISDOL) 1.25 MG (50000 UNIT) CAPS capsule Take 1 capsule (50,000 Units total) by mouth every 7 (seven) days. 06/23/20   Elsie Stain, MD    Allergies    Shellfish allergy  Review of Systems   Review of Systems  Musculoskeletal:       Reports for the last 1 to 2 months when he rides in the car his left leg will start feeling like it is going numb and he has to get out of the car and stand up and move and it improves.  He is not having the symptoms at this moment.  All other systems reviewed and are negative.   Physical Exam Updated Vital Signs BP (!) 161/87 (BP Location: Left Arm)   Pulse (!) 112   Temp 98.2 F (36.8 C) (Oral)   Resp (!) 22   SpO2 96%   Physical Exam Constitutional:      General: He is not in acute distress.    Appearance: He is well-developed. He is obese.  HENT:     Head: Normocephalic and atraumatic.     Right Ear: External ear normal.     Left Ear: External ear normal.  Eyes:     General:        Right eye: No discharge.     Conjunctiva/sclera: Conjunctivae normal.     Pupils: Pupils are equal, round, and reactive  to light.  Cardiovascular:     Rate and Rhythm: Regular rhythm. Tachycardia present.     Pulses: Normal pulses.     Heart sounds: Normal heart sounds. No murmur heard.   Pulmonary:     Effort: Tachypnea and accessory muscle usage present. No respiratory distress.     Breath sounds: Wheezing present. No decreased breath sounds, rhonchi or rales.  Abdominal:     General: Abdomen is flat.     Palpations:  Abdomen is soft.     Tenderness: There is no abdominal tenderness. There is no guarding.  Musculoskeletal:        General: No tenderness. Normal range of motion.     Cervical back: Normal range of motion and neck supple.     Right lower leg: No edema.     Left lower leg: No edema.  Skin:    General: Skin is warm and dry.     Findings: No rash.  Neurological:     General: No focal deficit present.     Mental Status: He is alert and oriented to person, place, and time. Mental status is at baseline.  Psychiatric:        Mood and Affect: Mood normal.        Behavior: Behavior normal.      ED Results / Procedures / Treatments   Labs (all labs ordered are listed, but only abnormal results are displayed) Labs Reviewed  CBC WITH DIFFERENTIAL/PLATELET - Abnormal; Notable for the following components:      Result Value   WBC 15.2 (*)    RBC 4.15 (*)    Hemoglobin 12.3 (*)    Neutro Abs 12.5 (*)    Monocytes Absolute 1.1 (*)    All other components within normal limits  COMPREHENSIVE METABOLIC PANEL - Abnormal; Notable for the following components:   CO2 19 (*)    Glucose, Bld 104 (*)    Creatinine, Ser 1.28 (*)    Calcium 8.2 (*)    All other components within normal limits  TROPONIN I (HIGH SENSITIVITY) - Abnormal; Notable for the following components:   Troponin I (High Sensitivity) 271 (*)    All other components within normal limits  RESPIRATORY PANEL BY RT PCR (FLU A&B, COVID)  BRAIN NATRIURETIC PEPTIDE  RAPID URINE DRUG SCREEN, HOSP PERFORMED  TROPONIN I (HIGH  SENSITIVITY)    EKG EKG Interpretation  Date/Time:  Saturday September 06 2020 09:05:07 EDT Ventricular Rate:  102 PR Interval:    QRS Duration: 94 QT Interval:  374 QTC Calculation: 488 R Axis:   79 Text Interpretation: Sinus tachycardia Anteroseptal infarct, age indeterminate No significant change since last tracing Confirmed by Blanchie Dessert [id number]) on 09/06/2020 9:08:04 AM   Radiology DG Chest Port 1 View  Result Date: 09/06/2020 CLINICAL DATA:  Shortness of breath. EXAM: PORTABLE CHEST 1 VIEW COMPARISON:  Chest radiographs 07/08/2020. FINDINGS: Heart size within normal limits. No appreciable airspace consolidation or pulmonary edema. No evidence of pleural effusion or pneumothorax. No acute bony abnormality identified. IMPRESSION: No evidence of acute cardiopulmonary abnormality. Electronically Signed   By: Kellie Simmering DO   On: 09/06/2020 08:11    Procedures Procedures (including critical care time)  Medications Ordered in ED Medications  albuterol (VENTOLIN HFA) 108 (90 Base) MCG/ACT inhaler 8 puff (has no administration in time range)  methylPREDNISolone sodium succinate (SOLU-MEDROL) 125 mg/2 mL injection 125 mg (has no administration in time range)  magnesium sulfate IVPB 2 g 50 mL (has no administration in time range)  ipratropium (ATROVENT HFA) inhaler 4 puff (has no administration in time range)    ED Course  I have reviewed the triage vital signs and the nursing notes.  Pertinent labs & imaging results that were available during my care of the patient were reviewed by me and considered in my medical decision making (see chart for details).    MDM Rules/Calculators/A&P  Patient presenting today with shortness of breath most consistent with COPD exacerbation.  Patient reports that he does have a prior history of congestive heart failure although this is not documented in his chart.  Patient has no distal edema or JVD consistent with CHF  today and suspect that that is not the ongoing issue.  Patient denies any infectious symptoms and lower suspicion for Covid and/or pneumonia.  Patient did receive albuterol in route with minimal improvement.  Patient is tachypneic, mild accessory muscle use and wheezing diffusely.  Patient will be given Solu-Medrol, magnesium, Atrovent and albuterol.  Labs and imaging are pending.  9:14 AM Repeat evaluation patient is still wheezing but work of breathing has improved.  That was after 2 rounds of albuterol and Atrovent.  As well as IV medications.  Labs show a CBC of a leukocytosis of 15,000 of unknown significance, CMP with baseline CKD that is unchanged and otherwise normal, troponin is elevated today at 271 and Covid is negative.  BNP is still pending but chest x-ray is clear.  On looking back at patient's prior hospitalization in August 2021 he did have findings of an elevated troponin at that time and underwent catheterization that showed nonobstructive disease.  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  Patient was complaining of chest tightness today but EKG here shows no acute changes.  Concern for possible cocaine use as the cause of patient's elevated troponin versus strain from COPD exacerbation.  Will start patient on a continuous neb as the wheezing has continued.  Patient will need admission for trending troponins and ensuring he is breathing better.  He is complaining of suicidal thoughts at this time but he is not currently medically clear.  Patient also reports that he used cocaine yesterday and he knew he should not but he just did not care because his depression has become so severe.  MDM Number of Diagnoses or Management Options   Amount and/or Complexity of Data Reviewed Clinical lab tests: ordered and reviewed Tests in the radiology section of CPT: ordered and reviewed Tests in the medicine section of CPT: ordered and  reviewed Decide to obtain previous medical records or to obtain history from someone other than the patient: yes Obtain history from someone other than the patient: yes Review and summarize past medical records: yes Discuss the patient with other providers: yes Independent visualization of images, tracings, or specimens: yes  Risk of Complications, Morbidity, and/or Mortality Presenting problems: high Diagnostic procedures: low Management options: moderate  Patient Progress Patient progress: improved  CRITICAL CARE Performed by: Danny Yackley Total critical care time: 40 minutes Critical care time was exclusive of separately billable procedures and treating other patients. Critical care was necessary to treat or prevent imminent or life-threatening deterioration. Critical care was time spent personally by me on the following activities: development of treatment plan with patient and/or surrogate as well as nursing, discussions with consultants, evaluation of patient's response to treatment, examination of patient, obtaining history from patient or surrogate, ordering and performing treatments and interventions, ordering and review of laboratory studies, ordering and review of radiographic studies, pulse oximetry and re-evaluation of patient's condition.  Final Clinical Impression(s) / ED Diagnoses Final diagnoses:  COPD exacerbation ([place])  Suicidal ideation  Cocaine use  Elevated troponin    Rx / DC Orders ED Discharge Orders    None       Blanchie Dessert, MD 09/06/20 [phone]

## 2020-09-06 NOTE — ED Notes (Signed)
Pt reports cocaine use yesterday

## 2020-09-07 ENCOUNTER — Inpatient Hospital Stay (HOSPITAL_COMMUNITY): Payer: Medicare HMO

## 2020-09-07 DIAGNOSIS — R45851 Suicidal ideations: Secondary | ICD-10-CM

## 2020-09-07 DIAGNOSIS — F332 Major depressive disorder, recurrent severe without psychotic features: Secondary | ICD-10-CM

## 2020-09-07 DIAGNOSIS — F1421 Cocaine dependence, in remission: Secondary | ICD-10-CM | POA: Diagnosis present

## 2020-09-07 DIAGNOSIS — F1490 Cocaine use, unspecified, uncomplicated: Secondary | ICD-10-CM

## 2020-09-07 DIAGNOSIS — J441 Chronic obstructive pulmonary disease with (acute) exacerbation: Principal | ICD-10-CM

## 2020-09-07 DIAGNOSIS — R778 Other specified abnormalities of plasma proteins: Secondary | ICD-10-CM

## 2020-09-07 DIAGNOSIS — F1410 Cocaine abuse, uncomplicated: Secondary | ICD-10-CM | POA: Diagnosis present

## 2020-09-07 LAB — HEMOGLOBIN A1C
Hgb A1c MFr Bld: 4.8 % (ref 4.8–5.6)
Mean Plasma Glucose: 91.06 mg/dL

## 2020-09-07 LAB — COMPREHENSIVE METABOLIC PANEL
ALT: 20 U/L (ref 0–44)
AST: 23 U/L (ref 15–41)
Albumin: 3.5 g/dL (ref 3.5–5.0)
Alkaline Phosphatase: 46 U/L (ref 38–126)
Anion gap: 8 (ref 5–15)
BUN: 17 mg/dL (ref 8–23)
CO2: 23 mmol/L (ref 22–32)
Calcium: 8.9 mg/dL (ref 8.9–10.3)
Chloride: 104 mmol/L (ref 98–111)
Creatinine, Ser: 1.23 mg/dL (ref 0.61–1.24)
GFR, Estimated: >60 mL/min (ref >60)
Glucose, Bld: 133 mg/dL — ABNORMAL HIGH (ref 70–99)
Potassium: 4.2 mmol/L (ref 3.5–5.1)
Sodium: 135 mmol/L (ref 135–145)
Total Bilirubin: 0.8 mg/dL (ref 0.3–1.2)
Total Protein: 6.7 g/dL (ref 6.5–8.1)

## 2020-09-07 LAB — CBC
HCT: 38.3 % — ABNORMAL LOW (ref 39.0–52.0)
Hemoglobin: 12.2 g/dL — ABNORMAL LOW (ref 13.0–17.0)
MCH: 30.1 pg (ref 26.0–34.0)
MCHC: 31.9 g/dL (ref 30.0–36.0)
MCV: 94.6 fL (ref 80.0–100.0)
Platelets: 219 10*3/uL (ref 150–400)
RBC: 4.05 MIL/uL — ABNORMAL LOW (ref 4.22–5.81)
RDW: 12.5 % (ref 11.5–15.5)
WBC: 23.6 10*3/uL — ABNORMAL HIGH (ref 4.0–10.5)
nRBC: 0 % (ref 0.0–0.2)

## 2020-09-07 LAB — GLUCOSE, CAPILLARY
Glucose-Capillary: 107 mg/dL — ABNORMAL HIGH (ref 70–99)
Glucose-Capillary: 140 mg/dL — ABNORMAL HIGH (ref 70–99)
Glucose-Capillary: 124 mg/dL — ABNORMAL HIGH (ref 70–99)
Glucose-Capillary: 147 mg/dL — ABNORMAL HIGH (ref 70–99)
Glucose-Capillary: 143 mg/dL — ABNORMAL HIGH (ref 70–99)

## 2020-09-07 MED ORDER — IPRATROPIUM-ALBUTEROL 0.5-2.5 (3) MG/3ML IN SOLN: 3.0000 mL | Freq: Four times a day (QID) | RESPIRATORY_TRACT | Status: DC | PRN

## 2020-09-07 NOTE — Consult Note (Signed)
[facility] Consult   Reason for Consult:  ''On 1:1 for active suicidal ideations.'' Referring Physician:  Gilles Chiquito, MD Patient Identification: Robert Chan MRN:  [id number] Principal Diagnosis: Severe recurrent major depression without psychotic features ([place]) Diagnosis:  Principal Problem:   Severe recurrent major depression without psychotic features ([place]) Active Problems:   Suicidal ideation   COPD exacerbation ([place])   Elevated troponin   Polysubstance abuse ([place])   Cocaine use   Total Time spent with patient: 1 hour  Subjective:   Robert Chan is a 64 y.o. male patient admitted with  Wheezing, SOB, Chest pain and suicidal ideation  HPI: 64 year old male with history of COPD, CKD, depression, alcohol use disorder-severe, cocaine use disorder-severe who was admitted to the hospital due to SOB, wheezing, DOE, chest pain  and suicidal ideation. Patient reports worsening depression characterized by hopelessness, low energy level, anhedonia and recurrent suicidal thoughts with plan to overdose or shoot himself. He reports multiple previous suicide attempts by overdosing warranting inpatient psychiatric admission but never kept his outpatient appointments. Instead, he self medicates by abusing alcohol and cocaine. Patient r attributes his depression to lack of permanent housing and personal life stress. UDS positive for cocaine only even though he states that he also drinks alcohol daily.He is alert, awake ,oriented, denies delusions, psychosis but unable to contract for safety and requesting for inpatient psychiatric admission.  Past Psychiatric History: as above  Risk to Self:  suicidal ideation with plan Risk to Others:  denies Prior Inpatient Therapy:  [facility] Prior Outpatient Therapy:  None reported  Past Medical History:  Past Medical History:  Diagnosis Date  . Asthma   . Cataract   . COPD (chronic obstructive pulmonary disease) ([place])   . Hypertension      Past Surgical History:  Procedure Laterality Date  . APPENDECTOMY    . EYE SURGERY    . LEFT HEART CATH AND CORONARY ANGIOGRAPHY N/A 06/05/2020   Procedure: LEFT HEART CATH AND CORONARY ANGIOGRAPHY;  Surgeon: Nigel Mormon, MD;  Location: [facility];  Service: Cardiovascular;  Laterality: N/A;  . PROSTATE SURGERY     Family History:  Family History  Problem Relation Age of Onset  . Hypertension Mother   . Diabetes Mother   . Hypertension Father   . Hypertension Sister    Family Psychiatric  History:  Social History:  Social History   Substance and Sexual Activity  Alcohol Use Yes     Social History   Substance and Sexual Activity  Drug Use No    Social History   Socioeconomic History  . Marital status: Single    Spouse name: Not on file  . Number of children: Not on file  . Years of education: Not on file  . Highest education level: Not on file  Occupational History  . Not on file  Tobacco Use  . Smoking status: Current Some Day Smoker  . Smokeless tobacco: Never Used  Substance and Sexual Activity  . Alcohol use: Yes  . Drug use: No  . Sexual activity: Yes  Other Topics Concern  . Not on file  Social History Narrative  . Not on file   Social Determinants of Health   Financial Resource Strain:   . Difficulty of Paying Living Expenses: Not on file  Food Insecurity:   . Worried About Charity fundraiser in the Last Year: Not on file  . Ran Out of Food in the Last Year: Not on file  Transportation Needs:   . Film/video editor (Medical): Not on file  . Lack of Transportation (Non-Medical): Not on file  Physical Activity:   . Days of Exercise per Week: Not on file  . Minutes of Exercise per Session: Not on file  Stress:   . Feeling of Stress : Not on file  Social Connections:   . Frequency of Communication with Friends and Family: Not on file  . Frequency of Social Gatherings with Friends and Family: Not on file  . Attends  Religious Services: Not on file  . Active Member of Clubs or Organizations: Not on file  . Attends Archivist Meetings: Not on file  . Marital Status: Not on file   Additional Social History:    Allergies:   Allergies  Allergen Reactions  . Shellfish Allergy Hives and Rash    Labs:  Results for orders placed or performed during the hospital encounter of 09/06/20 (from the past 48 hour(s))  Respiratory Panel by RT PCR (Flu A&B, Covid) - Nasopharyngeal Swab     Status: None   Collection Time: 09/06/20  7:49 AM   Specimen: Nasopharyngeal Swab  Result Value Ref Range   SARS Coronavirus 2 by RT PCR NEGATIVE NEGATIVE    Comment: (NOTE) SARS-CoV-2 target nucleic acids are NOT DETECTED.  The SARS-CoV-2 RNA is generally detectable in upper respiratoy specimens during the acute phase of infection. The lowest concentration of SARS-CoV-2 viral copies this assay can detect is 131 copies/mL. A negative result does not preclude SARS-Cov-2 infection and should not be used as the sole basis for treatment or other patient management decisions. A negative result may occur with  improper specimen collection/handling, submission of specimen other than nasopharyngeal swab, presence of viral mutation(s) within the areas targeted by this assay, and inadequate number of viral copies (<131 copies/mL). A negative result must be combined with clinical observations, patient history, and epidemiological information. The expected result is Negative.  Fact Sheet for Patients:  PinkCheek.be  Fact Sheet for Healthcare Providers:  GravelBags.it  This test is no t yet approved or cleared by the [place] FDA and  has been authorized for detection and/or diagnosis of SARS-CoV-2 by FDA under an Emergency Use Authorization (EUA). This EUA will remain  in effect (meaning this test can be used) for the duration of the COVID-19 declaration  under Section 564(b)(1) of the Act, 21 U.S.C. section 360bbb-3(b)(1), unless the authorization is terminated or revoked sooner.     Influenza A by PCR NEGATIVE NEGATIVE   Influenza B by PCR NEGATIVE NEGATIVE    Comment: (NOTE) The Xpert Xpress SARS-CoV-2/FLU/RSV assay is intended as an aid in  the diagnosis of influenza from Nasopharyngeal swab specimens and  should not be used as a sole basis for treatment. Nasal washings and  aspirates are unacceptable for Xpert Xpress SARS-CoV-2/FLU/RSV  testing.  Fact Sheet for Patients: PinkCheek.be  Fact Sheet for Healthcare Providers: GravelBags.it  This test is not yet approved or cleared by the [place] FDA and  has been authorized for detection and/or diagnosis of SARS-CoV-2 by  FDA under an Emergency Use Authorization (EUA). This EUA will remain  in effect (meaning this test can be used) for the duration of the  Covid-19 declaration under Section 564(b)(1) of the Act, 21  U.S.C. section 360bbb-3(b)(1), unless the authorization is  terminated or revoked. Performed at [facility], [place] [address], [place], [place] [address]   CBC with Differential/Platelet  Status: Abnormal   Collection Time: 09/06/20  7:56 AM  Result Value Ref Range   WBC 15.2 (H) 4.0 - 10.5 K/uL   RBC 4.15 (L) 4.22 - 5.81 MIL/uL   Hemoglobin 12.3 (L) 13.0 - 17.0 g/dL   HCT 40.4 39 - 52 %   MCV 97.3 80.0 - 100.0 fL   MCH 29.6 26.0 - 34.0 pg   MCHC 30.4 30.0 - 36.0 g/dL   RDW 12.5 11.5 - 15.5 %   Platelets 191 150 - 400 K/uL   nRBC 0.0 0.0 - 0.2 %   Neutrophils Relative % 82 %   Neutro Abs 12.5 (H) 1.7 - 7.7 K/uL   Lymphocytes Relative 9 %   Lymphs Abs 1.4 0.7 - 4.0 K/uL   Monocytes Relative 7 %   Monocytes Absolute 1.1 (H) 0.1 - 1.0 K/uL   Eosinophils Relative 0 %   Eosinophils Absolute 0.0 0.0 - 0.5 K/uL   Basophils Relative 1 %   Basophils Absolute 0.1 0.0 - 0.1 K/uL   Immature  Granulocytes 1 %   Abs Immature Granulocytes 0.07 0.00 - 0.07 K/uL    Comment: Performed at [facility], [address], [place], [place] [address]  Comprehensive metabolic panel     Status: Abnormal   Collection Time: 09/06/20  7:56 AM  Result Value Ref Range   Sodium 138 135 - 145 mmol/L   Potassium 4.4 3.5 - 5.1 mmol/L   Chloride 105 98 - 111 mmol/L   CO2 19 (L) 22 - 32 mmol/L   Glucose, Bld 104 (H) 70 - 99 mg/dL    Comment: Glucose reference range applies only to samples taken after fasting for at least 8 hours.   BUN 15 8 - 23 mg/dL   Creatinine, Ser 1.28 (H) 0.61 - 1.24 mg/dL   Calcium 8.2 (L) 8.9 - 10.3 mg/dL   Total Protein 7.1 6.5 - 8.1 g/dL   Albumin 3.9 3.5 - 5.0 g/dL   AST 33 15 - 41 U/L   ALT 22 0 - 44 U/L   Alkaline Phosphatase 53 38 - 126 U/L   Total Bilirubin 0.8 0.3 - 1.2 mg/dL   GFR, Estimated >60 >60 mL/min    Comment: (NOTE) Calculated using the CKD-EPI Creatinine Equation (2021)    Anion gap 14 5 - 15    Comment: Performed at [facility] [address], [place], [place] [address]  Troponin I (High Sensitivity)     Status: Abnormal   Collection Time: 09/06/20  7:56 AM  Result Value Ref Range   Troponin I (High Sensitivity) 271 (HH) <18 ng/L    Comment: CRITICAL RESULT CALLED TO, READ BACK BY AND VERIFIED WITH: J.MOOREFIELD RN @ [phone] 09/06/2020 BY C.EDENS (NOTE) Elevated high sensitivity troponin I (hsTnI) values and significant  changes across serial measurements may suggest ACS but many other  chronic and acute conditions are known to elevate hsTnI results.  Refer to the Links section for chest pain algorithms and additional  guidance. Performed at [facility], [place] [address] [place], [place] [address]   Brain natriuretic peptide     Status: None   Collection Time: 09/06/20  7:57 AM  Result Value Ref Range   B Natriuretic Peptide 92.6 0.0 - 100.0 pg/mL    Comment: Performed at [facility] [address]  [place], [place] [address]  Rapid urine drug screen (hospital performed)     Status: Abnormal   Collection  Time: 09/06/20 10:00 AM  Result Value Ref Range   Opiates NONE DETECTED NONE DETECTED   Cocaine POSITIVE (A) NONE DETECTED   Benzodiazepines NONE DETECTED NONE DETECTED   Amphetamines NONE DETECTED NONE DETECTED   Tetrahydrocannabinol NONE DETECTED NONE DETECTED   Barbiturates NONE DETECTED NONE DETECTED    Comment: (NOTE) DRUG SCREEN FOR MEDICAL PURPOSES ONLY.  IF CONFIRMATION IS NEEDED FOR ANY PURPOSE, NOTIFY LAB WITHIN 5 DAYS.  LOWEST DETECTABLE LIMITS FOR URINE DRUG SCREEN Drug Class                     Cutoff (ng/mL) Amphetamine and metabolites    1000 Barbiturate and metabolites    200 Benzodiazepine                 509 Tricyclics and metabolites     300 Opiates and metabolites        300 Cocaine and metabolites        300 THC                            50 Performed at [facility], [place] [address] [place], [place] [address]   Troponin I (High Sensitivity)     Status: Abnormal   Collection Time: 09/06/20 10:12 AM  Result Value Ref Range   Troponin I (High Sensitivity) 430 (HH) <18 ng/L    Comment: CRITICAL VALUE NOTED.  VALUE IS CONSISTENT WITH PREVIOUSLY REPORTED AND CALLED VALUE. (NOTE) Elevated high sensitivity troponin I (hsTnI) values and significant  changes across serial measurements may suggest ACS but many other  chronic and acute conditions are known to elevate hsTnI results.  Refer to the Links section for chest pain algorithms and additional  guidance. Performed at [facility], [place] [address], [place], [place] [address]   Glucose, capillary     Status: Abnormal   Collection Time: 09/06/20  5:33 PM  Result Value Ref Range   Glucose-Capillary 212 (H) 70 - 99 mg/dL    Comment: Glucose reference range applies only to samples taken after fasting for at least 8 hours.  Ethanol     Status: None   Collection Time: 09/06/20  5:59 PM  Result Value  Ref Range   Alcohol, Ethyl (B) <10 <10 mg/dL    Comment: (NOTE) Lowest detectable limit for serum alcohol is 10 mg/dL.  For medical purposes only. Performed at [facility], [place] [address], [place], [place] [address]   Troponin I (High Sensitivity)     Status: Abnormal   Collection Time: 09/06/20  5:59 PM  Result Value Ref Range   Troponin I (High Sensitivity) 157 (HH) <18 ng/L    Comment: CRITICAL VALUE NOTED.  VALUE IS CONSISTENT WITH PREVIOUSLY REPORTED AND CALLED VALUE. (NOTE) Elevated high sensitivity troponin I (hsTnI) values and significant  changes across serial measurements may suggest ACS but many other  chronic and acute conditions are known to elevate hsTnI results.  Refer to the Links section for chest pain algorithms and additional  guidance. Performed at [facility], [place] [address], [place], [place] [address]   Glucose, capillary     Status: Abnormal   Collection Time: 09/06/20  9:30 PM  Result Value Ref Range   Glucose-Capillary 192 (H) 70 - 99 mg/dL    Comment: Glucose reference range applies only to samples taken after fasting for at least 8 hours.  Glucose, capillary  Status: Abnormal   Collection Time: 09/07/20  6:46 AM  Result Value Ref Range   Glucose-Capillary 107 (H) 70 - 99 mg/dL    Comment: Glucose reference range applies only to samples taken after fasting for at least 8 hours.  Comprehensive metabolic panel     Status: Abnormal   Collection Time: 09/07/20  8:20 AM  Result Value Ref Range   Sodium 135 135 - 145 mmol/L   Potassium 4.2 3.5 - 5.1 mmol/L   Chloride 104 98 - 111 mmol/L   CO2 23 22 - 32 mmol/L   Glucose, Bld 133 (H) 70 - 99 mg/dL    Comment: Glucose reference range applies only to samples taken after fasting for at least 8 hours.   BUN 17 8 - 23 mg/dL   Creatinine, Ser 1.23 0.61 - 1.24 mg/dL   Calcium 8.9 8.9 - 10.3 mg/dL   Total Protein 6.7 6.5 - 8.1 g/dL   Albumin 3.5 3.5 - 5.0 g/dL   AST 23 15 - 41 U/L   ALT 20 0  - 44 U/L   Alkaline Phosphatase 46 38 - 126 U/L   Total Bilirubin 0.8 0.3 - 1.2 mg/dL   GFR, Estimated >60 >60 mL/min    Comment: (NOTE) Calculated using the CKD-EPI Creatinine Equation (2021)    Anion gap 8 5 - 15    Comment: Performed at [facility] [address], [place], [place] [address]  CBC     Status: Abnormal   Collection Time: 09/07/20  8:20 AM  Result Value Ref Range   WBC 23.6 (H) 4.0 - 10.5 K/uL   RBC 4.05 (L) 4.22 - 5.81 MIL/uL   Hemoglobin 12.2 (L) 13.0 - 17.0 g/dL   HCT 38.3 (L) 39 - 52 %   MCV 94.6 80.0 - 100.0 fL   MCH 30.1 26.0 - 34.0 pg   MCHC 31.9 30.0 - 36.0 g/dL   RDW 12.5 11.5 - 15.5 %   Platelets 219 150 - 400 K/uL   nRBC 0.0 0.0 - 0.2 %    Comment: Performed at [facility], [place] [address], [place], [place] [address]  Hemoglobin A1c     Status: None   Collection Time: 09/07/20  8:20 AM  Result Value Ref Range   Hgb A1c MFr Bld 4.8 4.8 - 5.6 %    Comment: (NOTE) Pre diabetes:          5.7%-6.4%  Diabetes:              >6.4%  Glycemic control for   <7.0% adults with diabetes    Mean Plasma Glucose 91.06 mg/dL    Comment: Performed at [facility] [address] [place], [place] [address]  Glucose, capillary     Status: Abnormal   Collection Time: 09/07/20  8:34 AM  Result Value Ref Range   Glucose-Capillary 140 (H) 70 - 99 mg/dL    Comment: Glucose reference range applies only to samples taken after fasting for at least 8 hours.    Current Facility-Administered Medications  Medication Dose Route Frequency Provider Last Rate Last Admin  . acetaminophen (TYLENOL) tablet 650 mg  650 mg Oral Q6H PRN Aslam, Loralyn Freshwater, MD   650 mg at 09/06/20 1427   Or  . acetaminophen (TYLENOL) suppository 650 mg  650 mg Rectal Q6H PRN Aslam, Sadia, MD      . albuterol (VENTOLIN HFA) 108 (90 Base) MCG/ACT inhaler 1-2 puff  1-2 puff Inhalation  Q6H PRN Harvie Heck, MD      . amLODipine (NORVASC) tablet 10 mg  10 mg Oral Daily Aslam, Sadia, MD   10  mg at 09/07/20 0839  . ARIPiprazole (ABILIFY) tablet 5 mg  5 mg Oral Daily Aslam, Sadia, MD   5 mg at 09/07/20 0839  . aspirin EC tablet 81 mg  81 mg Oral Daily Harvie Heck, MD   81 mg at 09/07/20 0839  . atorvastatin (LIPITOR) tablet 40 mg  40 mg Oral Daily Aslam, Loralyn Freshwater, MD   40 mg at 09/07/20 0839  . buPROPion (WELLBUTRIN XL) 24 hr tablet 150 mg  150 mg Oral Daily Aslam, Loralyn Freshwater, MD   150 mg at 09/07/20 0839  . enoxaparin (LOVENOX) injection 40 mg  40 mg Subcutaneous Q24H Aslam, Loralyn Freshwater, MD   40 mg at 09/07/20 0838  . fluticasone furoate-vilanterol (BREO ELLIPTA) 200-25 MCG/INH 1 puff  1 puff Inhalation Daily Aslam, Sadia, MD   1 puff at 09/07/20 0900  . folic acid (FOLVITE) tablet 1 mg  1 mg Oral Daily Aslam, Sadia, MD   1 mg at 09/07/20 0839  . insulin aspart (novoLOG) injection 0-9 Units  0-9 Units Subcutaneous TID WC Harvie Heck, MD   3 Units at 09/06/20 1747  . ipratropium-albuterol (DUONEB) 0.5-2.5 (3) MG/3ML nebulizer solution 3 mL  3 mL Nebulization Q6H PRN Gilles Chiquito B, MD      . multivitamin with minerals tablet 1 tablet  1 tablet Oral Daily Harvie Heck, MD   1 tablet at 09/07/20 0838  . nicotine (NICODERM CQ - dosed in mg/24 hours) patch 21 mg  21 mg Transdermal Daily Aslam, Sadia, MD      . ondansetron (ZOFRAN) tablet 4 mg  4 mg Oral Q6H PRN Aslam, Sadia, MD       Or  . ondansetron (ZOFRAN) injection 4 mg  4 mg Intravenous Q6H PRN Aslam, Sadia, MD      . polyethylene glycol (MIRALAX / GLYCOLAX) packet 17 g  17 g Oral Daily PRN Aslam, Sadia, MD      . predniSONE (DELTASONE) tablet 40 mg  40 mg Oral Q breakfast Aslam, Sadia, MD   40 mg at 09/07/20 0839  . sodium chloride flush (NS) 0.9 % injection 3 mL  3 mL Intravenous Q12H Harvie Heck, MD   3 mL at 09/07/20 0841  . thiamine tablet 100 mg  100 mg Oral Daily Aslam, Sadia, MD   100 mg at 09/07/20 3295   Or  . thiamine (B-1) injection 100 mg  100 mg Intravenous Daily Aslam, Sadia, MD      . traZODone (DESYREL) tablet 25 mg  25 mg  Oral QHS PRN Harvie Heck, MD        Musculoskeletal: Strength & Muscle Tone: within normal limits Gait & Station: normal Patient leans: N/A  Psychiatric Specialty Exam: Physical Exam Psychiatric:        Attention and Perception: Attention normal.        Mood and Affect: Mood is depressed.        Speech: Speech normal.        Behavior: Behavior normal. Behavior is cooperative.        Thought Content: Thought content includes suicidal ideation. Thought content includes suicidal plan.        Cognition and Memory: Cognition and memory normal.        Judgment: Judgment is impulsive.     Review of Systems  Constitutional: Positive for fatigue.  HENT:  Negative.   Eyes: Negative.   Respiratory: Negative.   Cardiovascular: Negative.   Gastrointestinal: Negative.   Endocrine: Negative.   Neurological: Negative.   Psychiatric/Behavioral: Positive for dysphoric mood and suicidal ideas.    Blood pressure 130/79, pulse 75, temperature 97.9 F (36.6 C), temperature source Oral, resp. rate (!) 23, weight 121.1 kg, SpO2 95 %.Body mass index is 32.5 kg/m.  General Appearance: Casual  Eye Contact:  Good  Speech:  Clear and Coherent  Volume:  Decreased  Mood:  Dysphoric  Affect:  Constricted  Thought Process:  Coherent, Goal Directed and Linear  Orientation:  Full (Time, Place, and Person)  Thought Content:  Logical  Suicidal Thoughts:  Yes.  with intent/plan  Homicidal Thoughts:  No  Memory:  Immediate;   Good Recent;   Good Remote;   Good  Judgement:  Poor  Insight:  Shallow  Psychomotor Activity:  Psychomotor Retardation  Concentration:  Concentration: Fair and Attention Span: Fair  Recall:  Good  Fund of Knowledge:  Good  Language:  Good  Akathisia:  No  Handed:  Right  AIMS (if indicated):     Assets:  Communication Skills Desire for Improvement  ADL's:  Intact  Cognition:  WNL  Sleep:        Treatment Plan Summary: 64 year old male with long history of Major  depression, substance use disorder who was admitted due to chest pain secondary to cocaine use and suicidal ideations. Patient reports ongoing depressive symptoms, suicidal ideations with plan to OD or shoot himself. He is unable to contract for safety.  Recommendations: -Continue 1:1 sitter for safety -Continue Abilify and Wellbutrin XL for depression -Consider social worker consult to facilitate inpatient psychiatric admission after patient is medically stable  Disposition: Recommend psychiatric Inpatient admission when medically cleared. Supportive therapy provided about ongoing stressors. Psychiatric service siging out. Re-consult as needed  Corena Pilgrim, MD 09/07/2020 11:32 AM

## 2020-09-07 NOTE — Progress Notes (Signed)
  Echocardiogram 2D Echocardiogram has been performed.  Robert Chan 09/07/2020, 2:39 PM

## 2020-09-07 NOTE — Progress Notes (Signed)
Subjective: HD 1  Overnight, no acute events reported.   [name] was evaluated at bedside this morning. He reports feeling okay this morning. He says that his chest pain is improved from 10/10 to 6/10. He notes that his breathing is better and he was able to eat breakfast as well this morning. He continues to endorse ongoing suicidal ideation.   Objective:  Vital signs in last 24 hours: Vitals:   09/06/20 1913 09/06/20 2002 09/07/20 0100 09/07/20 0401  BP: 137/83 125/81  123/84  Pulse: 88 82  68  Resp: 20 20  18   Temp: 97.9 F (36.6 C) 98.2 F (36.8 C)  97.6 F (36.4 C)  TempSrc: Oral Oral  Oral  SpO2: 95% 94%  95%  Weight:   121.1 kg    CBC Latest Ref Rng & Units 09/07/2020 09/06/2020 07/08/2020  WBC 4.0 - 10.5 K/uL 23.6(H) 15.2(H) 7.1  Hemoglobin 13.0 - 17.0 g/dL 12.2(L) 12.3(L) 12.5(L)  Hematocrit 39 - 52 % 38.3(L) 40.4 40.0  Platelets 150 - 400 K/uL 219 191 235   BMP Latest Ref Rng & Units 09/07/2020 09/06/2020 07/08/2020  Glucose 70 - 99 mg/dL 133(H) 104(H) 110(H)  BUN 8 - 23 mg/dL 17 15 14   Creatinine 0.61 - 1.24 mg/dL 1.23 1.28(H) 1.34(H)  BUN/Creat Ratio 10 - 24 - - -  Sodium 135 - 145 mmol/L 135 138 137  Potassium 3.5 - 5.1 mmol/L 4.2 4.4 4.1  Chloride 98 - 111 mmol/L 104 105 107  CO2 22 - 32 mmol/L 23 19(L) 21(L)  Calcium 8.9 - 10.3 mg/dL 8.9 8.2(L) 8.8(L)   Physical Exam  Constitutional: Appears well-developed and well-nourished. No distress.  HENT: Normocephalic and atraumatic, EOMI, conjunctiva normal, moist mucous membranes Cardiovascular: Normal rate, regular rhythm, S1 and S2 present, no murmurs, rubs, gallops.  Distal pulses intact Respiratory: No respiratory distress, no accessory muscle use.  Effort is normal.  On room air; diffuse expiratory wheezing, mostly in bilateral lower lung fields GI: Nondistended, soft, nontender to palpation, normal active bowel sounds Musculoskeletal: Normal bulk and tone.  Trace peripheral edema to mid tibia noted   Neurological: Is alert and oriented x4, no apparent focal deficits noted. Skin: Warm and dry.  No rash, erythema, lesions noted. Psychiatric: Depressed mood, flat effect. Continued suicidal ideation. Judgment and thought content normal.    Assessment/Plan:  Principal Problem:   COPD exacerbation ([place]) Active Problems:   Severe recurrent major depression without psychotic features ([place])   Suicidal ideation   Elevated troponin   Polysubstance abuse [facility])  [name] with PMHx of COPD, polysubstance abuse, severe recurrent major depression with multiple prior suicidal attempts and recent NSTEMI admitted for COPD exacerbation, troponinemia, and active suicidal ideation.   COPD exacerbation: Patient is admitted for COPD exacerbation and is on steroids and duoneb treatment. He initially required 2L of O2 but is currently on room air. Continues to have expiratory wheezing that is most significant in the lower lung fields, bilaterally. Leukocytosis increased from yesterday 23.6<15.2. However, patient remains afebrile and without productive sputum. Will hold off on initiation of antibiotics at this time.  - Continue duonebs q6h prn - Continue prednisone 40mg  day 2/5 - Breo Ellipta 1 puff daily  - Albuterol 1-2 puff q6h prn - Supplemental oxygen prn to maintain O2 >88% - Continue cardiac monitoring   Severe recurrent major depression disorder with suicidal ideation Patient with history of severe recurrent major depression with multiple suicidal attempts  in the past presenting with ongoing suicidal ideation. He had discontinued his home medications for the past few days which were restarted on admission. He continues to endorse suicidal ideation. Patient would benefit from inpatient psychiatry hospitalization for further optimization of his antidepressant regimen. - Psychiatry consulted, appreciate recommendations - Continue abilify 5mg  daily - Continue wellbutrin  XL 150mg  daily - Continue 1:1 supervision  Troponinemia 2/2 cocaine use:  Hypertension: Patient presenting with chest pressure and noted to have elevated troponins on admission in setting of recent cocaine use. EKG was unchanged from prior. He recently had LHC for NSTEMI that was significant for nonobstructive CAD. Cardiology was consulted and did not recommend repeat cath or heparin gtt at this time. His troponins have trended down. - Continue to monitor  - Continue amlodipine 10mg  daily  Steroid induced hyperglycemia: HbA1c 4.8. He does have mild hyperglycemia in setting of steroid use.  - Continue SSI  Diet: HH Fluids/Electrolytes: None DVT Prophylaxis: Lovenox Code status: DNR  Family communication: Per patient's wishes, he does not wish for his care to be discussed with family members   Prior to Admission Living Arrangement: Home Anticipated Discharge Location: Inpatient psychiatry unit  Barriers to Discharge: Continued medical management, psychiatric evaluation  Dispo: Anticipated discharge in approximately 2-3 day(s).   Harvie Heck, MD 09/07/2020, 6:13 AM Pager: @MYPAGER @ After 5pm on weekdays and 1pm on weekends: On Call pager [phone]

## 2020-09-07 NOTE — Progress Notes (Signed)
  Date: 09/07/2020  Patient name: Robert Chan  Medical record number: [id number]  Date of birth: 06-06-1956   I have seen and evaluated Robert Chan and discussed their care with the Residency Team. Briefly, Mr. Guyton presented with Chest pain, SOB, wheezing, DOE and suicidal ideation.  His chest pain is related to cocaine use and might be due to spasm.  Cardiology consulted yesterday and recommend a TTE, but no need for further LHC or heparin (see Dr. Mila Palmer note).  He further noted intermittent severe depression with SI and a plan.  He had not been taking his antidepressants as scheduled.  He was initiated on treatment for a COPD exacerbation and his medications were restarted.  His troponin was initially elevated but then trended down.   Vitals:   09/07/20 0828 09/07/20 0835  BP: 140/79 130/79  Pulse: 78 75  Resp: 15 (!) 23  Temp: 97.9 F (36.6 C)   SpO2: 96% 95%   General: Lying in bed, NAD Eyes: anicteric sclerae, no injection HENT: Neck is supple, MMM CV: RR, NR, no murmur noted, trace peripheral edema to mid shins Pulm: + expiratory wheezing, mainly in the left lower lobe, otherwise CTAB Abd: SOft, +BS, +BS MSK: normal tone and bulk Skin: No rash on exposed skin, old scar on right shin Psych: Flat affect, endorses continued SI  Assessment and Plan: I have seen and evaluated the patient as outlined above. I agree with the formulated Assessment and Plan as detailed in the residents' note, with the following changes:   1. COPD exacerbation - Hypoxia has improved - Continue steroids for 5 days - Duonebs - Supplemental O2 as needed - Cardiac monitoring  2. Elevated troponin, possible coronary spasm - Cardiology recommendations noted - Amlodipine increased - Cardiac monitoring - Now that troponin is trending down, can discontinue monitoring  3. SI - 1:1 sitter - Suicide precautions - Restart home medications - Psychiatry consult  Other issues per Dr. Margy Clarks  daily note.   Sid Falcon, MD 11/7/202110:36 AM

## 2020-09-08 ENCOUNTER — Other Ambulatory Visit: Payer: Self-pay | Admitting: Psychiatric/Mental Health

## 2020-09-08 ENCOUNTER — Inpatient Hospital Stay (HOSPITAL_COMMUNITY)
Admission: AD | Admit: 2020-09-08 | Discharge: 2020-09-15 | DRG: 885 | Disposition: A | Discharge status: Home / Self Care | Payer: Medicare HMO | Source: Intra-hospital | Attending: Psychiatry | Admitting: Psychiatry

## 2020-09-08 DIAGNOSIS — I129 Hypertensive chronic kidney disease with stage 1 through stage 4 chronic kidney disease, or unspecified chronic kidney disease: Secondary | ICD-10-CM | POA: Diagnosis present

## 2020-09-08 DIAGNOSIS — Z8249 Family history of ischemic heart disease and other diseases of the circulatory system: Secondary | ICD-10-CM | POA: Diagnosis not present

## 2020-09-08 DIAGNOSIS — I2510 Atherosclerotic heart disease of native coronary artery without angina pectoris: Secondary | ICD-10-CM | POA: Diagnosis not present

## 2020-09-08 DIAGNOSIS — Z811 Family history of alcohol abuse and dependence: Secondary | ICD-10-CM

## 2020-09-08 DIAGNOSIS — I252 Old myocardial infarction: Secondary | ICD-10-CM

## 2020-09-08 DIAGNOSIS — Z7951 Long term (current) use of inhaled steroids: Secondary | ICD-10-CM | POA: Diagnosis not present

## 2020-09-08 DIAGNOSIS — Z7982 Long term (current) use of aspirin: Secondary | ICD-10-CM

## 2020-09-08 DIAGNOSIS — N189 Chronic kidney disease, unspecified: Secondary | ICD-10-CM | POA: Diagnosis present

## 2020-09-08 DIAGNOSIS — R440 Auditory hallucinations: Secondary | ICD-10-CM | POA: Diagnosis not present

## 2020-09-08 DIAGNOSIS — Z720 Tobacco use: Secondary | ICD-10-CM | POA: Diagnosis present

## 2020-09-08 DIAGNOSIS — I25118 Atherosclerotic heart disease of native coronary artery with other forms of angina pectoris: Secondary | ICD-10-CM | POA: Diagnosis not present

## 2020-09-08 DIAGNOSIS — J449 Chronic obstructive pulmonary disease, unspecified: Secondary | ICD-10-CM | POA: Diagnosis not present

## 2020-09-08 DIAGNOSIS — R45851 Suicidal ideations: Secondary | ICD-10-CM | POA: Diagnosis not present

## 2020-09-08 DIAGNOSIS — Z818 Family history of other mental and behavioral disorders: Secondary | ICD-10-CM | POA: Diagnosis not present

## 2020-09-08 DIAGNOSIS — Z79899 Other long term (current) drug therapy: Secondary | ICD-10-CM | POA: Diagnosis not present

## 2020-09-08 DIAGNOSIS — Z833 Family history of diabetes mellitus: Secondary | ICD-10-CM | POA: Diagnosis not present

## 2020-09-08 DIAGNOSIS — F17210 Nicotine dependence, cigarettes, uncomplicated: Secondary | ICD-10-CM | POA: Diagnosis present

## 2020-09-08 DIAGNOSIS — F332 Major depressive disorder, recurrent severe without psychotic features: Secondary | ICD-10-CM | POA: Diagnosis not present

## 2020-09-08 DIAGNOSIS — F1421 Cocaine dependence, in remission: Secondary | ICD-10-CM | POA: Diagnosis present

## 2020-09-08 DIAGNOSIS — I214 Non-ST elevation (NSTEMI) myocardial infarction: Secondary | ICD-10-CM | POA: Diagnosis not present

## 2020-09-08 DIAGNOSIS — E559 Vitamin D deficiency, unspecified: Secondary | ICD-10-CM | POA: Diagnosis not present

## 2020-09-08 DIAGNOSIS — J441 Chronic obstructive pulmonary disease with (acute) exacerbation: Secondary | ICD-10-CM | POA: Diagnosis not present

## 2020-09-08 DIAGNOSIS — R69 Illness, unspecified: Secondary | ICD-10-CM | POA: Diagnosis not present

## 2020-09-08 DIAGNOSIS — F419 Anxiety disorder, unspecified: Secondary | ICD-10-CM | POA: Diagnosis not present

## 2020-09-08 DIAGNOSIS — R778 Other specified abnormalities of plasma proteins: Secondary | ICD-10-CM | POA: Diagnosis not present

## 2020-09-08 DIAGNOSIS — F1410 Cocaine abuse, uncomplicated: Secondary | ICD-10-CM | POA: Diagnosis present

## 2020-09-08 DIAGNOSIS — Z9151 Personal history of suicidal behavior: Secondary | ICD-10-CM | POA: Diagnosis not present

## 2020-09-08 DIAGNOSIS — J410 Simple chronic bronchitis: Secondary | ICD-10-CM | POA: Diagnosis not present

## 2020-09-08 HISTORY — DX: Depression, unspecified: F32.A

## 2020-09-08 HISTORY — DX: Non-ST elevation (NSTEMI) myocardial infarction: I21.4

## 2020-09-08 HISTORY — DX: Atherosclerotic heart disease of native coronary artery without angina pectoris: I25.10

## 2020-09-08 HISTORY — DX: Chronic kidney disease, unspecified: N18.9

## 2020-09-08 LAB — ECHOCARDIOGRAM COMPLETE
Area-P 1/2: 3.72 cm2
Calc EF: 56.7 %
S' Lateral: 3.7 cm
Single Plane A2C EF: 50 %
Single Plane A4C EF: 58.3 %
Weight: 4271.63 oz

## 2020-09-08 LAB — BASIC METABOLIC PANEL
Anion gap: 8 (ref 5–15)
BUN: 27 mg/dL — ABNORMAL HIGH (ref 8–23)
CO2: 22 mmol/L (ref 22–32)
Calcium: 8.6 mg/dL — ABNORMAL LOW (ref 8.9–10.3)
Chloride: 105 mmol/L (ref 98–111)
Creatinine, Ser: 1.38 mg/dL — ABNORMAL HIGH (ref 0.61–1.24)
GFR, Estimated: 57 mL/min — ABNORMAL LOW (ref >60)
Glucose, Bld: 142 mg/dL — ABNORMAL HIGH (ref 70–99)
Potassium: 4.3 mmol/L (ref 3.5–5.1)
Sodium: 135 mmol/L (ref 135–145)

## 2020-09-08 LAB — GLUCOSE, CAPILLARY
Glucose-Capillary: 109 mg/dL — ABNORMAL HIGH (ref 70–99)
Glucose-Capillary: 136 mg/dL — ABNORMAL HIGH (ref 70–99)
Glucose-Capillary: 166 mg/dL — ABNORMAL HIGH (ref 70–99)

## 2020-09-08 LAB — CBC
HCT: 36.8 % — ABNORMAL LOW (ref 39.0–52.0)
Hemoglobin: 11.8 g/dL — ABNORMAL LOW (ref 13.0–17.0)
MCH: 30.3 pg (ref 26.0–34.0)
MCHC: 32.1 g/dL (ref 30.0–36.0)
MCV: 94.4 fL (ref 80.0–100.0)
Platelets: 202 10*3/uL (ref 150–400)
RBC: 3.9 MIL/uL — ABNORMAL LOW (ref 4.22–5.81)
RDW: 12.6 % (ref 11.5–15.5)
WBC: 19.7 10*3/uL — ABNORMAL HIGH (ref 4.0–10.5)
nRBC: 0 % (ref 0.0–0.2)

## 2020-09-08 MED ORDER — ONDANSETRON 4 MG PO TBDP: 4.0000 mg | ORAL_TABLET | Freq: Four times a day (QID) | ORAL | Status: AC | PRN

## 2020-09-08 MED ORDER — MAGNESIUM HYDROXIDE 400 MG/5ML PO SUSP
30.0000 mL | Freq: Every day | ORAL | Status: DC | PRN
  Administered 2020-09-14: 30 mL via ORAL
  Filled 2020-09-08: qty 30

## 2020-09-08 MED ORDER — ATORVASTATIN CALCIUM 40 MG PO TABS: 40.0000 mg | ORAL_TABLET | Freq: Every day | ORAL | 0 refills | Status: DC

## 2020-09-08 MED ORDER — HYDROXYZINE HCL 25 MG PO TABS
25.0000 mg | ORAL_TABLET | Freq: Three times a day (TID) | ORAL | Status: DC | PRN
  Administered 2020-09-08 – 2020-09-13 (×5): 25 mg via ORAL
  Filled 2020-09-08 (×5): qty 1

## 2020-09-08 MED ORDER — ATORVASTATIN CALCIUM 40 MG PO TABS
40.0000 mg | ORAL_TABLET | Freq: Every day | ORAL | Status: DC
  Administered 2020-09-09 – 2020-09-15 (×7): 40 mg via ORAL
  Filled 2020-09-08 (×9): qty 1

## 2020-09-08 MED ORDER — ACETAMINOPHEN 325 MG PO TABS
650.0000 mg | ORAL_TABLET | Freq: Four times a day (QID) | ORAL | Status: DC | PRN
  Administered 2020-09-14 (×2): 650 mg via ORAL
  Filled 2020-09-08 (×2): qty 2

## 2020-09-08 MED ORDER — ARIPIPRAZOLE 5 MG PO TABS
5.0000 mg | ORAL_TABLET | Freq: Every day | ORAL | Status: DC
  Administered 2020-09-09: 5 mg via ORAL
  Filled 2020-09-08 (×3): qty 1

## 2020-09-08 MED ORDER — AMLODIPINE BESYLATE 10 MG PO TABS: 10.0000 mg | ORAL_TABLET | Freq: Every day | ORAL | 0 refills | Status: DC

## 2020-09-08 MED ORDER — ALUM & MAG HYDROXIDE-SIMETH 200-200-20 MG/5ML PO SUSP: 30.0000 mL | ORAL | Status: DC | PRN

## 2020-09-08 MED ORDER — ALBUTEROL SULFATE HFA 108 (90 BASE) MCG/ACT IN AERS
1.0000 | INHALATION_SPRAY | Freq: Four times a day (QID) | RESPIRATORY_TRACT | Status: DC | PRN
  Administered 2020-09-08 – 2020-09-13 (×5): 2 via RESPIRATORY_TRACT
  Filled 2020-09-08: qty 6.7

## 2020-09-08 MED ORDER — LOPERAMIDE HCL 2 MG PO CAPS: 2.0000 mg | ORAL_CAPSULE | ORAL | Status: AC | PRN

## 2020-09-08 MED ORDER — LORAZEPAM 1 MG PO TABS: 1.0000 mg | ORAL_TABLET | Freq: Four times a day (QID) | ORAL | Status: AC | PRN

## 2020-09-08 MED ORDER — NICOTINE 21 MG/24HR TD PT24
21.0000 mg | MEDICATED_PATCH | Freq: Every day | TRANSDERMAL | Status: DC
  Filled 2020-09-08 (×9): qty 1

## 2020-09-08 MED ORDER — PREDNISONE 20 MG PO TABS
40.0000 mg | ORAL_TABLET | Freq: Every day | ORAL | Status: DC
  Administered 2020-09-09 – 2020-09-10 (×2): 40 mg via ORAL
  Filled 2020-09-08 (×3): qty 2

## 2020-09-08 MED ORDER — PREDNISONE 20 MG PO TABS
40.0000 mg | ORAL_TABLET | Freq: Every day | ORAL | 0 refills | Status: DC
Start: 2020-09-09 — End: 2020-09-15

## 2020-09-08 MED ORDER — AMLODIPINE BESYLATE 10 MG PO TABS
10.0000 mg | ORAL_TABLET | Freq: Every day | ORAL | Status: DC
  Administered 2020-09-09 – 2020-09-15 (×7): 10 mg via ORAL
  Filled 2020-09-08 (×9): qty 1

## 2020-09-08 MED ORDER — FLUTICASONE FUROATE-VILANTEROL 100-25 MCG/INH IN AEPB
1.0000 | INHALATION_SPRAY | Freq: Every day | RESPIRATORY_TRACT | Status: DC
  Administered 2020-09-09 – 2020-09-15 (×7): 1 via RESPIRATORY_TRACT
  Filled 2020-09-08: qty 28

## 2020-09-08 MED ORDER — IPRATROPIUM-ALBUTEROL 0.5-2.5 (3) MG/3ML IN SOLN: 3.0000 mL | Freq: Four times a day (QID) | RESPIRATORY_TRACT | Status: DC | PRN

## 2020-09-08 MED ORDER — INSULIN ASPART 100 UNIT/ML ~~LOC~~ SOLN: 0.0000 [IU] | Freq: Three times a day (TID) | SUBCUTANEOUS | Status: DC

## 2020-09-08 MED ORDER — TRAZODONE HCL 50 MG PO TABS
25.0000 mg | ORAL_TABLET | Freq: Every evening | ORAL | Status: DC | PRN
  Administered 2020-09-08 – 2020-09-14 (×6): 25 mg via ORAL
  Filled 2020-09-08 (×6): qty 1

## 2020-09-08 MED ORDER — BUPROPION HCL ER (XL) 150 MG PO TB24
150.0000 mg | ORAL_TABLET | Freq: Every day | ORAL | Status: DC
  Administered 2020-09-09: 150 mg via ORAL
  Filled 2020-09-08 (×3): qty 1

## 2020-09-08 MED ORDER — ADULT MULTIVITAMIN W/MINERALS CH
1.0000 | ORAL_TABLET | Freq: Every day | ORAL | Status: DC
  Administered 2020-09-09 – 2020-09-15 (×7): 1 via ORAL
  Filled 2020-09-08 (×9): qty 1

## 2020-09-08 MED ORDER — THIAMINE HCL 100 MG PO TABS
100.0000 mg | ORAL_TABLET | Freq: Every day | ORAL | Status: DC
  Administered 2020-09-09 – 2020-09-15 (×7): 100 mg via ORAL
  Filled 2020-09-08 (×9): qty 1

## 2020-09-08 MED ORDER — ALBUTEROL SULFATE HFA 108 (90 BASE) MCG/ACT IN AERS
1.0000 | INHALATION_SPRAY | Freq: Four times a day (QID) | RESPIRATORY_TRACT | Status: DC | PRN
  Filled 2020-09-08: qty 6.7

## 2020-09-08 MED ORDER — FOLIC ACID 1 MG PO TABS
1.0000 mg | ORAL_TABLET | Freq: Every day | ORAL | Status: DC
  Administered 2020-09-09 – 2020-09-15 (×7): 1 mg via ORAL
  Filled 2020-09-08 (×9): qty 1

## 2020-09-08 MED ORDER — ASPIRIN EC 81 MG PO TBEC
81.0000 mg | DELAYED_RELEASE_TABLET | Freq: Every day | ORAL | Status: DC
  Administered 2020-09-09 – 2020-09-15 (×7): 81 mg via ORAL
  Filled 2020-09-08 (×9): qty 1

## 2020-09-08 MED FILL — ATORVASTATIN CALCIUM 40 MG: 40 | 30 days supply | Qty: 30 | Fill #0

## 2020-09-08 MED FILL — AMLODIPINE BESYLATE 10 MG T: 10 | 30 days supply | Qty: 30 | Fill #0

## 2020-09-08 MED FILL — predniSONE 20 MG TABS: 20 | 2 days supply | Qty: 4 | Fill #0

## 2020-09-08 NOTE — Discharge Summary (Signed)
Name: Robert Chan MRN: [id number] DOB: 11-04-1955 64 y.o. PCP: Elsie Stain, MD  Date of Admission: 09/06/2020  7:02 AM Date of Discharge: 09/08/2020 Attending Physician: Axel Filler, *  Discharge Diagnosis: 1. COPD exacerbation 2. Troponinemia in setting of cocaine use 3. Recurrent severe major depression with active suicidal ideation  Discharge Medications: Allergies as of 09/08/2020      Reactions   Shellfish Allergy Hives, Rash      Medication List    TAKE these medications   albuterol 108 (90 Base) MCG/ACT inhaler Commonly known as: VENTOLIN HFA Inhale 1-2 puffs into the lungs every 6 (six) hours as needed for wheezing or shortness of breath. What changed: Another medication with the same name was removed. Continue taking this medication, and follow the directions you see here.   amLODipine 10 MG tablet Commonly known as: NORVASC Take 1 tablet (10 mg total) by mouth daily. Start taking on: September 09, 2020 What changed:   medication strength  how much to take   ARIPiprazole 5 MG tablet Commonly known as: ABILIFY Take 1 tablet (5 mg total) by mouth daily.   aspirin EC 81 MG tablet Take 1 tablet (81 mg total) by mouth daily. Swallow whole.   atorvastatin 40 MG tablet Commonly known as: LIPITOR Take 1 tablet (40 mg total) by mouth daily. Start taking on: September 09, 2020 What changed:   medication strength  how much to take   buPROPion 150 MG 24 hr tablet Commonly known as: Wellbutrin XL Take 1 tablet (150 mg total) by mouth daily.   folic acid 1 MG tablet Commonly known as: FOLVITE Take 1 tablet (1 mg total) by mouth daily.   mirtazapine 15 MG tablet Commonly known as: Remeron Take 1 tablet (15 mg total) by mouth at bedtime.   predniSONE 20 MG tablet Commonly known as: DELTASONE Take 2 tablets (40 mg total) by mouth daily with breakfast. Start taking on: September 09, 2020   Symbicort 160-4.5 MCG/ACT inhaler Generic drug:  budesonide-formoterol Inhale 2 puffs into the lungs in the morning and at bedtime.   thiamine 100 MG tablet Take 1 tablet (100 mg total) by mouth daily.   Vitamin D (Ergocalciferol) 1.25 MG (50000 UNIT) Caps capsule Commonly known as: DRISDOL Take 1 capsule (50,000 Units total) by mouth every 7 (seven) days.       Disposition and follow-up:   [name] was discharged from [facility] in Stable condition.  At the hospital follow up visit please address:  1.  COPD exacerbation: Continue prednisone 40mg  daily for 2 days on discharge to complete 5 day course. Continue Symbicort twice daily and albuterol prn.  Troponinemia: Secondary to cocaine use. Increased amlodipine to 10mg  daily. Recommend substance use cessation.  Recurrent severe major depression: Active suicidal ideation. Resumed on home abilify 5mg  daily and wellbutrin 150mg  daily. Admitted to inpatient psychiatry for further management  2.  Labs / imaging needed at time of follow-up: None  3.  Pending labs/ test needing follow-up: None   Follow-up Appointments:   Hospital Course by problem list: COPD exacerbation:  Patient is admitted for COPD exacerbation and was treated with steroids and duoneb treatment PRN. He initially required 2L of O2 but is currently on room air. His breathing has improved - lungs sound clear to auscultation, and there is no longer expiratory wheezing. Leukocytosis is trending down (23.6 > 19.7). Patient remains afebrile and without productive sputum, therefore we did not initiate antibiotics. He should  continue taking prednisone 40mg  (day 3/5), and resume his home medications (albuterol and Symbicort) after discharge. Patient is an ongoing tobacco user and was provided counseling for smoking cessation.   Troponinemia 2/2 cocaine use:  Patient presenting with chest pressure and noted to have elevated troponins on admission in setting of recent cocaine use. EKG was unchanged  from prior admission on 06/04/2020. He recently had LHC (06/05/2020) for NSTEMI that was significant for nonobstructive CAD. Cardiology was consulted and did not recommend repeat cath or heparin gtt at this time. His troponins have trended down. Chest pressure has improved. Patient should continue home medications at discharge (Amlodipine and atorvastatin). Patient received counseling on substance abuse including cocaine use, and alcohol abuse.      Severe recurrent major depression disorder with suicidal ideation:  Patient with history of severe recurrent major depression with multiple suicidal attempts in the past presenting with ongoing suicidal ideation. He had discontinued his home medications for the past few days which were restarted on admission. He continues to endorse suicidal ideation with a plan. Psychiatry was consulted and recommends discharge to inpatient psychiatry unit for medication optimization.     Discharge Vitals:   BP 131/85 (BP Location: Right Arm)   Pulse 80   Temp 98.3 F (36.8 C) (Oral)   Resp 15   Wt 119.8 kg   SpO2 94%   BMI 32.16 kg/m   Pertinent Labs, Studies, and Procedures:  CBC Latest Ref Rng & Units 09/08/2020 09/07/2020 09/06/2020  WBC 4.0 - 10.5 K/uL 19.7(H) 23.6(H) 15.2(H)  Hemoglobin 13.0 - 17.0 g/dL 11.8(L) 12.2(L) 12.3(L)  Hematocrit 39 - 52 % 36.8(L) 38.3(L) 40.4  Platelets 150 - 400 K/uL 202 219 191   BMP Latest Ref Rng & Units 09/08/2020 09/07/2020 09/06/2020  Glucose 70 - 99 mg/dL 142(H) 133(H) 104(H)  BUN 8 - 23 mg/dL 27(H) 17 15  Creatinine 0.61 - 1.24 mg/dL 1.38(H) 1.23 1.28(H)  BUN/Creat Ratio 10 - 24 - - -  Sodium 135 - 145 mmol/L 135 135 138  Potassium 3.5 - 5.1 mmol/L 4.3 4.2 4.4  Chloride 98 - 111 mmol/L 105 104 105  CO2 22 - 32 mmol/L 22 23 19(L)  Calcium 8.9 - 10.3 mg/dL 8.6(L) 8.9 8.2(L)   Hgb A1c MFr Bld 4.8 - 5.6 % 4.8    Troponin I (High Sensitivity) <18 ng/L 157High Panic  430High Panic CM  271High Panic CM    Opiates  NONE DETECTED NONE DETECTED   Cocaine NONE DETECTED POSITIVEAbnormal   Benzodiazepines NONE DETECTED NONE DETECTED   Amphetamines NONE DETECTED NONE DETECTED   Tetrahydrocannabinol NONE DETECTED NONE DETECTED   Barbiturates NONE DETECTED NONE DETECTED    CXR 09/06/2020: IMPRESSION: No evidence of acute cardiopulmonary abnormality.  Discharge Instructions: Discharge Instructions    Call MD for:  difficulty breathing, headache or visual disturbances   Complete by: As directed    Call MD for:  extreme fatigue   Complete by: As directed    Call MD for:  persistant dizziness or light-headedness   Complete by: As directed    Call MD for:  persistant nausea and vomiting   Complete by: As directed    Call MD for:  temperature >100.4   Complete by: As directed    Diet - low sodium heart healthy   Complete by: As directed    Discharge instructions   Complete by: As directed    Mr Robert, Chan were admitted to the hospital with shortness of breath and  chest pressure. You were treated for a COPD exacerbation which has improved. On discharge, please continue to take prednisone 40mg  daily for 2 days. Continue taking your Symbicort and Albuterol inhaler as prescribed. In regards to your chest pressure, you had elevated troponins on admission. This was in the setting of cocaine use. Your troponin levels were followed during this admission and improved. Your amlodipine was increased to 10mg  daily. On discharge, please continue to take amlodipine 10mg  daily. Recommend stopping cocaine use. You are being transferred to the inpatient behavioral health unit for active suicidal ideation. You are being continued on Abilify 5mg  daily and Wellbutrin XL 150mg  daily. They will further adjust your medications.  Thank you for allowing [place] to participate in your care.   Increase activity slowly   Complete by: As directed       Signed: Harvie Heck, MD  [id number] 09/08/2020, 4:25 PM   Pager:  [phone]

## 2020-09-08 NOTE — Progress Notes (Addendum)
Subjective:  No acute events over night.   Patient was seen and examined at bedside this morning. Patient was sitting in chair and appeared comfortable. He said he feels better. His breathing has improved, and admits to occasional cough with no sputum. He says his chest pressure continues to improve, and is now a 5/10. He continues to have thoughts of harming himself, and just laughed it off when asked about his plan. He is in agreement with the plan to go to inpatient psych for further management. He is medically cleared.    Objective:  Vital signs in last 24 hours: Vitals:   09/08/20 0400 09/08/20 0429 09/08/20 0815 09/08/20 0846  BP: (!) 131/95  131/79   Pulse: 65  75   Resp:   19   Temp:   98.1 F (36.7 C)   TempSrc:   Oral   SpO2:   100% 96%  Weight:  119.8 kg     Weight change: -1.26 kg  Intake/Output Summary (Last 24 hours) at 09/08/2020 1115 Last data filed at 09/08/2020 0715 Gross per 24 hour  Intake 600 ml  Output 1600 ml  Net -1000 ml   Physical Exam: Gen: NAD, well-developed, alert, interactive HEENT: [place]/AT, EOMI, moist mucus membranes CV: RRR, normal s1/s2, no m/r/g RESP: normal respiratory effort, no wheezing/rhonchi/rales, CTAB, on room air ABD: soft, non-distended, non-tender, normal bowel sounds [place]: AAOx3, no focal deficits Psych: depressed mood, flat affect, continued suicidal ideation, judgement and thought content is normal   Assessment/Plan:  Principal Problem:   Severe recurrent major depression without psychotic features ([place]) Active Problems:   Suicidal ideation   COPD exacerbation ([facility])   Elevated troponin   Cocaine use disorder ([facility])   Barlow Harrison is a 64 y/o male with PMHx of COPD, substance use disorder, severe recurrent major depression with multiple prior suicidal attempts and recent NSTEMI admitted for COPD exacerbation, troponinemia, and active suicidal ideation. Patient is medically ready for transfer to inpatient psychiatry  unit.    COPD exacerbation: Patient is admitted for COPD exacerbation and is doing well on steroids and duoneb treatment PRN. He is currently not requiring supplemental oxygen.  His breathing has improved - lungs sound clear to auscultation, and there is no longer expiratory wheezing. Leukocytosis is trending down (23.6 > 19.7). Patient remains afebrile and without productive sputum.  Pneumonia ruled out - Discontinue duonebs q6h prn - Continue prednisone 40mg  day 3/5 - Breo Ellipta 1 puff daily  - Albuterol 1-2 puff q6h prn - Continue with home medications at discharge (albuterol and Symbicort)  Severe recurrent major depression disorder with suicidal ideation Patient with history of severe recurrent major depression with multiple suicidal attempts in the past presenting with ongoing suicidal ideation. He continues to endorse suicidal ideation. Will reach out to SW to assist in discharge to inpatient behavioral health unit.  - Psychiatry consulted, appreciate recommendations - Continue abilify 5mg  daily - Continue wellbutrin XL 150mg  daily - Reaching out to SW to arrange transfer to inpatient behavioral health unit - Continue 1:1 supervision   Chest pain and elevated troponin due to cocaine use:  Patient presenting with chest pressure and noted to have elevated troponins on admission in setting of recent cocaine use. Chest pressure has improved. His troponins have trended down.  Acute coronary syndrome ruled out.  Echocardiogram is reassuring, normal ejection fraction and no structural heart disease. - Continue amlodipine 10mg  daily   Leukocytosis: Reactive in the setting of systemic steroids.  Anticipate this will  improve once steroids are discontinued a few days.  No signs of pneumonia or other infection.  Diet: HH Fluids/Electrolytes: None DVT Prophylaxis: Lovenox while admitted to medicine, none required when admitted to behavioral health Code status: DNR  Family communication: Per  patient's wishes, he does not wish for his care to be discussed with family members    LOS: 2 days   Lytle Michaels, Medical Student 09/08/2020, 11:15 AM

## 2020-09-08 NOTE — TOC Transition Note (Signed)
Transition of Care [facility]) - CM/SW Discharge Note   Patient Details  Name: Detron Carras MRN: [id number] Date of Birth: 1956/07/19  Transition of Care [facility]) CM/SW Contact:  Coralee Pesa, [place] Phone Number: 09/08/2020, 4:42 PM   Clinical Narrative:    Rm [id number] Nurse to call report to [phone].      Barriers to Discharge: Continued Medical Work up   Patient Goals and CMS Choice        Discharge Placement                       Discharge Plan and Services   Discharge Planning Services: CM Consult                                 Social Determinants of Health (SDOH) Interventions     Readmission Risk Interventions No flowsheet data found.

## 2020-09-08 NOTE — TOC Progression Note (Signed)
Transition of Care [facility]) - Progression Note    Patient Details  Name: Baraka Klatt MRN: [id number] Date of Birth: 28-Apr-1956  Transition of Care [facility]) CM/SW [place], [place] Phone Number: 09/08/2020, 2:47 PM  Clinical Narrative:     CSW advised that pt is medically ready to discharge. Spoke with Audree Camel to begin process of psych placement. SW will continue to follow for safe DC plan.   Expected Discharge Plan: Homeless Shelter Barriers to Discharge: Continued Medical Work up  Expected Discharge Plan and Services Expected Discharge Plan: Homeless Shelter   Discharge Planning Services: CM Consult                                           Social Determinants of Health (SDOH) Interventions    Readmission Risk Interventions No flowsheet data found.

## 2020-09-09 ENCOUNTER — Other Ambulatory Visit: Payer: Self-pay

## 2020-09-09 ENCOUNTER — Encounter (HOSPITAL_COMMUNITY): Payer: Self-pay | Admitting: Psychiatry

## 2020-09-09 DIAGNOSIS — F332 Major depressive disorder, recurrent severe without psychotic features: Principal | ICD-10-CM

## 2020-09-09 LAB — LIPID PANEL
Cholesterol: 172 mg/dL (ref 0–200)
HDL: 71 mg/dL (ref >40)
LDL Cholesterol: 86 mg/dL (ref 0–99)
Total CHOL/HDL Ratio: 2.4 RATIO
Triglycerides: 76 mg/dL (ref <150)
VLDL: 15 mg/dL (ref 0–40)

## 2020-09-09 LAB — GLUCOSE, CAPILLARY
Glucose-Capillary: 94 mg/dL (ref 70–99)
Glucose-Capillary: 107 mg/dL — ABNORMAL HIGH (ref 70–99)
Glucose-Capillary: 177 mg/dL — ABNORMAL HIGH (ref 70–99)
Glucose-Capillary: 191 mg/dL — ABNORMAL HIGH (ref 70–99)

## 2020-09-09 LAB — TSH: TSH: 2.64 u[IU]/mL (ref 0.350–4.500)

## 2020-09-09 MED ORDER — ARIPIPRAZOLE 10 MG PO TABS
10.0000 mg | ORAL_TABLET | Freq: Every day | ORAL | Status: DC
  Administered 2020-09-10 – 2020-09-13 (×4): 10 mg via ORAL
  Filled 2020-09-09 (×6): qty 1

## 2020-09-09 MED ORDER — WHITE PETROLATUM EX OINT
TOPICAL_OINTMENT | CUTANEOUS | Status: AC
  Filled 2020-09-09: qty 5

## 2020-09-09 MED ORDER — INFLUENZA VAC A&B SA ADJ QUAD 0.5 ML IM PRSY
0.5000 mL | PREFILLED_SYRINGE | INTRAMUSCULAR | Status: DC
  Filled 2020-09-09: qty 0.5

## 2020-09-09 MED ORDER — BUPROPION HCL ER (XL) 150 MG PO TB24
150.0000 mg | ORAL_TABLET | Freq: Every day | ORAL | Status: DC
  Administered 2020-09-10: 150 mg via ORAL
  Filled 2020-09-09 (×2): qty 1

## 2020-09-09 MED ORDER — BUPROPION HCL ER (XL) 300 MG PO TB24: 300.0000 mg | ORAL_TABLET | Freq: Every day | ORAL | Status: DC

## 2020-09-09 NOTE — BHH Counselor (Signed)
CSW placed group therapy meetings list in pt's chart.  Toney Reil, Center Worker [employer]

## 2020-09-09 NOTE — BHH Suicide Risk Assessment (Signed)
[facility] Admission Suicide Risk Assessment   Nursing information obtained from:  Patient Demographic factors:  Male, Age 64 or older, Low socioeconomic status, Unemployed Current Mental Status:  Suicidal ideation indicated by patient Loss Factors:  Loss of significant relationship, Decline in physical health, Financial problems / change in socioeconomic status Historical Factors:  Prior suicide attempts, Impulsivity Risk Reduction Factors:  Positive social support  Total Time spent with patient: 20 minutes Principal Problem: <principal problem not specified> Diagnosis:  Active Problems:   MDD (major depressive disorder), recurrent episode, severe ([place])   Data: 64 year old male with MDD and poly susbstance abuse Continued Clinical Symptoms:  Alcohol Use Disorder Identification Test Final Score (AUDIT): 7 The "Alcohol Use Disorders Identification Test", Guidelines for Use in Primary Care, Second Edition.  World Pharmacologist [facility]). Score between 0-7:  no or low risk or alcohol related problems. Score between 8-15:  moderate risk of alcohol related problems. Score between 16-19:  high risk of alcohol related problems. Score 20 or above:  warrants further diagnostic evaluation for alcohol dependence and treatment.   CLINICAL FACTORS:   Depression:   Anhedonia Comorbid alcohol abuse/dependence Hopelessness Alcohol/Substance Abuse/Dependencies More than one psychiatric diagnosis  See HPI for Mental status  COGNITIVE FEATURES THAT CONTRIBUTE TO RISK:  None    SUICIDE RISK:   Mild:  Suicidal ideation of limited frequency, intensity, duration, and specificity.  There are no identifiable plans, no associated intent, mild dysphoria and related symptoms, good self-control (both objective and subjective assessment), few other risk factors, and identifiable protective factors, including available and accessible social support.  PLAN OF CARE: Continue care on inpatient basis  I certify  that inpatient services furnished can reasonably be expected to improve the patient's condition.   Dixie Dials, MD 09/09/2020, 2:18 PM
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
## 2020-09-09 NOTE — Progress Notes (Signed)
completed

## 2020-09-09 NOTE — Plan of Care (Signed)
Progress note  D: pt found in bed; compliant with medication administration. Pt denies any physical pain but does express concerns with anxiety and depression. Pt's affect was sad/sullen. Pt is minimal with their assessment with poor eye contact. Pt has been isolative to their room. Pt was pleasant though. Pt denies si/hi/ah/vh and verbally agrees to approach staff if these become apparent or before harming themself/others while at [facility].  A: Pt provided support and encouragement. Pt given medication per protocol and standing orders. Q72m safety checks implemented and continued.  R: Pt safe on the unit. Will continue to monitor.  Pt progressing in the following metrics  Problem: Coping: Goal: Ability to verbalize frustrations and anger appropriately will improve Outcome: Progressing   Problem: Health Behavior/Discharge Planning: Goal: Identification of resources available to assist in meeting health care needs will improve Outcome: Progressing   Problem: Physical Regulation: Goal: Ability to maintain clinical measurements within normal limits will improve Outcome: Progressing

## 2020-09-09 NOTE — [facility] Suicide Risk Assessment (Signed)
[facility] INPATIENT:  Family/Significant Other Suicide Prevention Education  Patient unable to provide consent for Family/Significant Other Suicide Prevention Education: The patient has been unable to provide written consent for family/significant other to be provided Family/Significant Other Suicide Prevention Education during admission and/or prior to discharge. Physician notified.  SPE completed with patient, as patient has been unable to provide consent for family contact. SPE pamphlet placed on chart for patient to share with supports at discharge    Toney Reil, [place] Worker [employer]

## 2020-09-09 NOTE — Progress Notes (Signed)
Recreation Therapy Notes  Animal-Assisted Activity (AAA) Program Checklist/Progress Notes Patient Eligibility Criteria Checklist & Daily Group note for Rec Tx Intervention  Date: 11.9.21 Time: 8 Location: [address]   AAA/T Program Assumption of Risk Form signed by Teacher, music or Parent Legal Guardian  YES  Patient is free of allergies or sever asthma  YES   Patient reports no fear of animals YES   Patient reports no history of cruelty to animals YES  Patient understands his/her participation is voluntary YES  Patient washes hands before animal contact YES   Patient washes hands after animal contact YES  Behavioral Response: Engaged  Education: Contractor, Appropriate Animal Interaction   Education Outcome: Acknowledges understanding/In group clarification offered/Needs additional education.   Clinical Observations/Feedback: Pt attended and participated.    Victorino Sparrow, LRT/CTRS         Victorino Sparrow A 09/09/2020 3:25 PM
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
## 2020-09-09 NOTE — Tx Team (Signed)
Initial Treatment Plan 09/08/2020 8:00 PM Robert Chan [id number]    PATIENT STRESSORS: Health problems Loss of several close family members due to covid Medication change or noncompliance Substance abuse   PATIENT STRENGTHS: Capable of independent living Communication skills Motivation for treatment/growth   PATIENT IDENTIFIED PROBLEMS: Depression: "feeling ready to give up"  Loss of several close family members due to covid  Wanting to "go to sleep and not wake up"  When admitted to the ED had SI with a plan to o/d on pills  Several past suicide attempts  COPD exacerbation  Cocaine use         DISCHARGE CRITERIA:  Ability to meet basic life and health needs Adequate post-discharge living arrangements Improved stabilization in mood, thinking, and/or behavior Medical problems require only outpatient monitoring Motivation to continue treatment in a less acute level of care Need for constant or close observation no longer present Reduction of life-threatening or endangering symptoms to within safe limits  PRELIMINARY DISCHARGE PLAN: Attend PHP/IOP Attend 12-step recovery group Outpatient therapy Placement in alternative living arrangements  PATIENT/FAMILY INVOLVEMENT: This treatment plan has been presented to and reviewed with the patient, Robert Chan.  The patient and family have been given the opportunity to ask questions and make suggestions.  Harlow Asa, RN 09/08/2020, 8:00 PM
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
## 2020-09-10 LAB — GLUCOSE, CAPILLARY
Glucose-Capillary: 95 mg/dL (ref 70–99)
Glucose-Capillary: 224 mg/dL — ABNORMAL HIGH (ref 70–99)

## 2020-09-10 MED ORDER — WHITE PETROLATUM EX OINT
TOPICAL_OINTMENT | CUTANEOUS | Status: AC
  Administered 2020-09-10: 1
  Filled 2020-09-10: qty 5

## 2020-09-10 MED ORDER — BUPROPION HCL ER (XL) 300 MG PO TB24
300.0000 mg | ORAL_TABLET | Freq: Every day | ORAL | Status: DC
  Administered 2020-09-11 – 2020-09-15 (×5): 300 mg via ORAL
  Filled 2020-09-10 (×7): qty 1

## 2020-09-10 MED ORDER — WHITE PETROLATUM EX OINT
TOPICAL_OINTMENT | CUTANEOUS | Status: AC
  Filled 2020-09-10: qty 5

## 2020-09-10 NOTE — Progress Notes (Signed)
Recreation Therapy Notes  Date:  11.10.21 Time: 0930 Location: [address] Dayroom  Group Topic: Stress Management  Goal Area(s) Addresses:  Patient will identify positive stress management techniques. Patient will identify benefits of using stress management post d/c.  Behavioral Response: Engaged  Intervention: Stress Management  Activity : Meditation.  LRT read a script that focused on enjoying the peaceful waves at the beach.  Patients were to listen and follow as script was read to fully engage in activity.  Education:  Stress Management, Discharge Planning.   Education Outcome: Acknowledges Education  Clinical Observations/Feedback: Pt attended and participated in activity.    Victorino Sparrow, LRT/CTRS        Victorino Sparrow A 09/10/2020 11:07 AM

## 2020-09-10 NOTE — [facility] Group Notes (Signed)
[facility] LCSW Group Therapy  09/10/2020 2:33 PM  Type of Therapy:  Group Therapy  Participation Level:  Active  Participation Quality:  Appropriate and Attentive  Summary of Progress/Problems: Pt attended group and shared that he was thankful for life.   Mliss Fritz 09/10/2020, 2:33 PM

## 2020-09-10 NOTE — Tx Team (Signed)
Interdisciplinary Treatment and Diagnostic Plan Update  09/10/2020 Time of Session: 9:05am Robert Chan MRN: [id number]  Principal Diagnosis: <principal problem not specified>  Secondary Diagnoses: Active Problems:   MDD (major depressive disorder), recurrent episode, severe (HCC)   Current Medications:  Current Facility-Administered Medications  Medication Dose Route Frequency Provider Last Rate Last Admin  . acetaminophen (TYLENOL) tablet 650 mg  650 mg Oral Q6H PRN Connye Burkitt, NP      . albuterol (VENTOLIN HFA) 108 (90 Base) MCG/ACT inhaler 1-2 puff  1-2 puff Inhalation Q6H PRN Connye Burkitt, NP   2 puff at 09/09/20 2112  . alum & mag hydroxide-simeth (MAALOX/MYLANTA) 200-200-20 MG/5ML suspension 30 mL  30 mL Oral Q4H PRN Connye Burkitt, NP      . amLODipine (NORVASC) tablet 10 mg  10 mg Oral Daily Connye Burkitt, NP   10 mg at 09/10/20 0739  . ARIPiprazole (ABILIFY) tablet 10 mg  10 mg Oral Daily Cristofano, Dorene Ar, MD   10 mg at 09/10/20 0740  . aspirin EC tablet 81 mg  81 mg Oral Daily Connye Burkitt, NP   81 mg at 09/10/20 0740  . atorvastatin (LIPITOR) tablet 40 mg  40 mg Oral Daily Connye Burkitt, NP   40 mg at 09/10/20 0739  . buPROPion (WELLBUTRIN XL) 24 hr tablet 150 mg  150 mg Oral Daily Cristofano, Dorene Ar, MD   150 mg at 09/10/20 0738  . fluticasone furoate-vilanterol (BREO ELLIPTA) 100-25 MCG/INH 1 puff  1 puff Inhalation Daily Connye Burkitt, NP   1 puff at 09/10/20 0737  . folic acid (FOLVITE) tablet 1 mg  1 mg Oral Daily Connye Burkitt, NP   1 mg at 09/10/20 0740  . hydrOXYzine (ATARAX/VISTARIL) tablet 25 mg  25 mg Oral TID PRN Connye Burkitt, NP   25 mg at 09/09/20 2111  . influenza vaccine adjuvanted (FLUAD) injection 0.5 mL  0.5 mL Intramuscular Tomorrow-1000 Nwoko, Uchenna E, PA      . insulin aspart (novoLOG) injection 0-6 Units  0-6 Units Subcutaneous TID WC Connye Burkitt, NP      . ipratropium-albuterol (DUONEB) 0.5-2.5 (3) MG/3ML nebulizer solution 3 mL   3 mL Nebulization Q6H PRN Connye Burkitt, NP      . loperamide (IMODIUM) capsule 2-4 mg  2-4 mg Oral PRN Connye Burkitt, NP      . LORazepam (ATIVAN) tablet 1 mg  1 mg Oral Q6H PRN Connye Burkitt, NP      . magnesium hydroxide (MILK OF MAGNESIA) suspension 30 mL  30 mL Oral Daily PRN Connye Burkitt, NP      . multivitamin with minerals tablet 1 tablet  1 tablet Oral Daily Connye Burkitt, NP   1 tablet at 09/10/20 0739  . nicotine (NICODERM CQ - dosed in mg/24 hours) patch 21 mg  21 mg Transdermal Daily Connye Burkitt, NP      . ondansetron (ZOFRAN-ODT) disintegrating tablet 4 mg  4 mg Oral Q6H PRN Connye Burkitt, NP      . predniSONE (DELTASONE) tablet 40 mg  40 mg Oral Q breakfast Connye Burkitt, NP   40 mg at 09/10/20 0739  . thiamine tablet 100 mg  100 mg Oral Daily Connye Burkitt, NP   100 mg at 09/10/20 0740  . traZODone (DESYREL) tablet 25 mg  25 mg Oral QHS PRN,MR X 1 Connye Burkitt, NP   25 mg  at 09/09/20 2112   PTA Medications: Medications Prior to Admission  Medication Sig Dispense Refill Last Dose  . albuterol (VENTOLIN HFA) 108 (90 Base) MCG/ACT inhaler Inhale 1-2 puffs into the lungs every 6 (six) hours as needed for wheezing or shortness of breath. 90 g 2   . amLODipine (NORVASC) 10 MG tablet Take 1 tablet (10 mg total) by mouth daily. 30 tablet 0   . ARIPiprazole (ABILIFY) 5 MG tablet Take 1 tablet (5 mg total) by mouth daily. 30 tablet 0   . aspirin EC 81 MG tablet Take 1 tablet (81 mg total) by mouth daily. Swallow whole. 30 tablet 11   . atorvastatin (LIPITOR) 40 MG tablet Take 1 tablet (40 mg total) by mouth daily. 30 tablet 0   . buPROPion (WELLBUTRIN XL) 150 MG 24 hr tablet Take 1 tablet (150 mg total) by mouth daily. 90 tablet 2   . folic acid (FOLVITE) 1 MG tablet Take 1 tablet (1 mg total) by mouth daily. 30 tablet 1   . mirtazapine (REMERON) 15 MG tablet Take 1 tablet (15 mg total) by mouth at bedtime. 30 tablet 2   . predniSONE (DELTASONE) 20 MG tablet Take 2 tablets  (40 mg total) by mouth daily with breakfast. 4 tablet 0   . SYMBICORT 160-4.5 MCG/ACT inhaler Inhale 2 puffs into the lungs in the morning and at bedtime. 10.2 g 11   . thiamine 100 MG tablet Take 1 tablet (100 mg total) by mouth daily. 30 tablet 1   . Vitamin D, Ergocalciferol, (DRISDOL) 1.25 MG (50000 UNIT) CAPS capsule Take 1 capsule (50,000 Units total) by mouth every 7 (seven) days. 5 capsule 5     Patient Stressors: Health problems Loss of several close family members due to covid Medication change or noncompliance Substance abuse  Patient Strengths: Capable of independent living Agricultural engineer for treatment/growth  Treatment Modalities: Medication Management, Group therapy, Case management,  1 to 1 session with clinician, Psychoeducation, Recreational therapy.   Physician Treatment Plan for Primary Diagnosis: <principal problem not specified> Long Term Goal(s): Improvement in symptoms so as ready for discharge   Short Term Goals: Ability to identify changes in lifestyle to reduce recurrence of condition will improve Ability to verbalize feelings will improve Ability to disclose and discuss suicidal ideas Ability to demonstrate self-control will improve Ability to identify and develop effective coping behaviors will improve Ability to identify triggers associated with substance abuse/mental health issues will improve  Medication Management: Evaluate patient's response, side effects, and tolerance of medication regimen.  Therapeutic Interventions: 1 to 1 sessions, Unit Group sessions and Medication administration.  Evaluation of Outcomes: Progressing  Physician Treatment Plan for Secondary Diagnosis: Active Problems:   MDD (major depressive disorder), recurrent episode, severe ([place])  Long Term Goal(s): Improvement in symptoms so as ready for discharge   Short Term Goals: Ability to identify changes in lifestyle to reduce recurrence of condition will  improve Ability to verbalize feelings will improve Ability to disclose and discuss suicidal ideas Ability to demonstrate self-control will improve Ability to identify and develop effective coping behaviors will improve Ability to identify triggers associated with substance abuse/mental health issues will improve     Medication Management: Evaluate patient's response, side effects, and tolerance of medication regimen.  Therapeutic Interventions: 1 to 1 sessions, Unit Group sessions and Medication administration.  Evaluation of Outcomes: Progressing   RN Treatment Plan for Primary Diagnosis: <principal problem not specified> Long Term Goal(s): Knowledge of disease and therapeutic  regimen to maintain health will improve  Short Term Goals: Ability to remain free from injury will improve, Ability to demonstrate self-control, Ability to participate in decision making will improve, Ability to verbalize feelings will improve, Ability to disclose and discuss suicidal ideas and Ability to identify and develop effective coping behaviors will improve  Medication Management: RN will administer medications as ordered by provider, will assess and evaluate patient's response and provide education to patient for prescribed medication. RN will report any adverse and/or side effects to prescribing provider.  Therapeutic Interventions: 1 on 1 counseling sessions, Psychoeducation, Medication administration, Evaluate responses to treatment, Monitor vital signs and CBGs as ordered, Perform/monitor CIWA, COWS, AIMS and Fall Risk screenings as ordered, Perform wound care treatments as ordered.  Evaluation of Outcomes: Progressing   LCSW Treatment Plan for Primary Diagnosis: <principal problem not specified> Long Term Goal(s): Safe transition to appropriate next level of care at discharge, Engage patient in therapeutic group addressing interpersonal concerns.  Short Term Goals: Engage patient in aftercare  planning with referrals and resources, Increase social support, Increase emotional regulation, Facilitate acceptance of mental health diagnosis and concerns, Identify triggers associated with mental health/substance abuse issues and Increase skills for wellness and recovery  Therapeutic Interventions: Assess for all discharge needs, 1 to 1 time with Social worker, Explore available resources and support systems, Assess for adequacy in community support network, Educate family and significant other(s) on suicide prevention, Complete Psychosocial Assessment, Interpersonal group therapy.  Evaluation of Outcomes: Progressing   Progress in Treatment: Attending groups: Yes. Participating in groups: Yes. Taking medication as prescribed: Yes. Toleration medication: Yes. Family/Significant other contact made: No, will contact:  consents not provided Patient understands diagnosis: Yes. and No. Discussing patient identified problems/goals with staff: Yes. Medical problems stabilized or resolved: Yes. Denies suicidal/homicidal ideation: Yes. Issues/concerns per patient self-inventory: No.   New problem(s) identified: No, Describe:  None   New Short Term/Long Term Goal(s): medication stabilization, elimination of SI thoughts, development of comprehensive mental wellness plan.   Patient Goals: "To get back on the right medications"   Discharge Plan or Barriers: Patient recently admitted. CSW will continue to follow and assess for appropriate referrals and possible discharge planning.   Reason for Continuation of Hospitalization: Depression Medication stabilization Suicidal ideation Withdrawal symptoms  Estimated Length of Stay: 3 to 5 days   Attendees: Patient: Robert Chan  09/10/2020   Physician: Lala Lund, MD 09/10/2020   Nursing:  09/10/2020   RN Care Manager: 09/10/2020   Social Worker: Verdis Frederickson, [place] 09/10/2020   Recreational Therapist:  09/10/2020   Other:   09/10/2020   Other:  09/10/2020   Other: 09/10/2020      Scribe for Treatment Team: Darleen Crocker, [place] 09/10/2020 9:39 AM

## 2020-09-10 NOTE — Progress Notes (Signed)
[facility] MD Progress Note  09/10/2020 8:56 AM Timo Hartwig  MRN:  [id number]  Daily Note: The patient was examined, assessed, and discussed with treatment team. Overnight, the patient reports sleeping well with no issues. His appetite has been good as well. He is tolerating medications well and is cooperative with group therapy and activities. Reports that his mood is about the same, slightly improved, and he appears to have a positive outlook. He made a fair amount of eye contact this morning. He denies SI, HI, and AVH this morning, stating "not yet" when asked about these.     Principal Problem: <principal problem not specified> Diagnosis: Active Problems:   MDD (major depressive disorder), recurrent episode, severe (HCC)  Total Time spent with patient: 15 minutes  Past Psychiatric History: see H&P  Past Medical History:  Past Medical History:  Diagnosis Date   Asthma    CAD (coronary artery disease)    Cataract    CKD (chronic kidney disease)    COPD (chronic obstructive pulmonary disease) (HCC)    Depression    Hypertension    NSTEMI (non-ST elevated myocardial infarction) ([place])     Past Surgical History:  Procedure Laterality Date   APPENDECTOMY     EYE SURGERY     LEFT HEART CATH AND CORONARY ANGIOGRAPHY N/A 06/05/2020   Procedure: LEFT HEART CATH AND CORONARY ANGIOGRAPHY;  Surgeon: Nigel Mormon, MD;  Location: [place] [facility];  Service: Cardiovascular;  Laterality: N/A;   PROSTATE SURGERY     Family History:  Family History  Problem Relation Age of Onset   Hypertension Mother    Diabetes Mother    Hypertension Father    Hypertension Sister    Family Psychiatric  History: see H&P Social History:  Social History   Substance and Sexual Activity  Alcohol Use Yes   Comment: drinks 2x/wk, 2-3 40 oz drinks, last drink Friday     Social History   Substance and Sexual Activity  Drug Use Yes   Types: Cocaine   Comment: cocaine use once a  month, last use Friday     Social History   Socioeconomic History   Marital status: Single    Spouse name: Not on file   Number of children: Not on file   Years of education: Not on file   Highest education level: Not on file  Occupational History   Not on file  Tobacco Use   Smoking status: Current Every Day Smoker   Smokeless tobacco: Never Used   Tobacco comment: smokes 3-4 cigarettes/day  Substance and Sexual Activity   Alcohol use: Yes    Comment: drinks 2x/wk, 2-3 40 oz drinks, last drink Friday   Drug use: Yes    Types: Cocaine    Comment: cocaine use once a month, last use Friday    Sexual activity: Not Currently  Other Topics Concern   Not on file  Social History Narrative   Not on file   Social Determinants of Health   Financial Resource Strain:    Difficulty of Paying Living Expenses: Not on file  Food Insecurity:    Worried About Charity fundraiser in the Last Year: Not on file   [employer] of Food in the Last Year: Not on file  Transportation Needs:    Lack of Transportation (Medical): Not on file   Lack of Transportation (Non-Medical): Not on file  Physical Activity:    Days of Exercise per Week: Not on file  Minutes of Exercise per Session: Not on file  Stress:    Feeling of Stress : Not on file  Social Connections:    Frequency of Communication with Friends and Family: Not on file   Frequency of Social Gatherings with Friends and Family: Not on file   Attends Religious Services: Not on file   Active Member of Clubs or Organizations: Not on file   Attends Archivist Meetings: Not on file   Marital Status: Not on file   Additional Social History:    Currently living with [employer], has about a month remaining. He is open to resources once he is discharged.   Sleep: Fair  Appetite:  Fair  Current Medications: Current Facility-Administered Medications  Medication Dose Route Frequency Provider  Last Rate Last Admin   acetaminophen (TYLENOL) tablet 650 mg  650 mg Oral Q6H PRN Connye Burkitt, NP       albuterol (VENTOLIN HFA) 108 (90 Base) MCG/ACT inhaler 1-2 puff  1-2 puff Inhalation Q6H PRN Connye Burkitt, NP   2 puff at 09/09/20 2112   alum & mag hydroxide-simeth (MAALOX/MYLANTA) 200-200-20 MG/5ML suspension 30 mL  30 mL Oral Q4H PRN Connye Burkitt, NP       amLODipine (NORVASC) tablet 10 mg  10 mg Oral Daily Connye Burkitt, NP   10 mg at 09/10/20 0739   ARIPiprazole (ABILIFY) tablet 10 mg  10 mg Oral Daily Jalynn Waddell, Dorene Ar, MD   10 mg at 09/10/20 0740   aspirin EC tablet 81 mg  81 mg Oral Daily Connye Burkitt, NP   81 mg at 09/10/20 0740   atorvastatin (LIPITOR) tablet 40 mg  40 mg Oral Daily Connye Burkitt, NP   40 mg at 09/10/20 0739   buPROPion (WELLBUTRIN XL) 24 hr tablet 150 mg  150 mg Oral Daily Allenmichael Mcpartlin, Dorene Ar, MD   150 mg at 09/10/20 0738   fluticasone furoate-vilanterol (BREO ELLIPTA) 100-25 MCG/INH 1 puff  1 puff Inhalation Daily Connye Burkitt, NP   1 puff at 61/60/73 7106   folic acid (FOLVITE) tablet 1 mg  1 mg Oral Daily Connye Burkitt, NP   1 mg at 09/10/20 0740   hydrOXYzine (ATARAX/VISTARIL) tablet 25 mg  25 mg Oral TID PRN Connye Burkitt, NP   25 mg at 09/09/20 2111   influenza vaccine adjuvanted (FLUAD) injection 0.5 mL  0.5 mL Intramuscular Tomorrow-1000 Nwoko, Uchenna E, PA       insulin aspart (novoLOG) injection 0-6 Units  0-6 Units Subcutaneous TID WC Connye Burkitt, NP       ipratropium-albuterol (DUONEB) 0.5-2.5 (3) MG/3ML nebulizer solution 3 mL  3 mL Nebulization Q6H PRN Connye Burkitt, NP       loperamide (IMODIUM) capsule 2-4 mg  2-4 mg Oral PRN Connye Burkitt, NP       LORazepam (ATIVAN) tablet 1 mg  1 mg Oral Q6H PRN Connye Burkitt, NP       magnesium hydroxide (MILK OF MAGNESIA) suspension 30 mL  30 mL Oral Daily PRN Connye Burkitt, NP       multivitamin with minerals tablet 1 tablet  1 tablet Oral Daily Connye Burkitt, NP   1  tablet at 09/10/20 0739   nicotine (NICODERM CQ - dosed in mg/24 hours) patch 21 mg  21 mg Transdermal Daily Connye Burkitt, NP       ondansetron (ZOFRAN-ODT) disintegrating tablet 4 mg  4 mg Oral Q6H PRN Connye Burkitt, NP       predniSONE (DELTASONE) tablet 40 mg  40 mg Oral Q breakfast Connye Burkitt, NP   40 mg at 09/10/20 8841   thiamine tablet 100 mg  100 mg Oral Daily Connye Burkitt, NP   100 mg at 09/10/20 0740   traZODone (DESYREL) tablet 25 mg  25 mg Oral QHS PRN,MR X 1 Connye Burkitt, NP   25 mg at 09/09/20 2112    Lab Results:  Results for orders placed or performed during the hospital encounter of 09/08/20 (from the past 48 hour(s))  Glucose, capillary     Status: Abnormal   Collection Time: 09/08/20  8:58 PM  Result Value Ref Range   Glucose-Capillary 166 (H) 70 - 99 mg/dL    Comment: Glucose reference range applies only to samples taken after fasting for at least 8 hours.   Comment 1 Notify RN   Glucose, capillary     Status: None   Collection Time: 09/09/20  5:52 AM  Result Value Ref Range   Glucose-Capillary 94 70 - 99 mg/dL    Comment: Glucose reference range applies only to samples taken after fasting for at least 8 hours.  Lipid panel     Status: None   Collection Time: 09/09/20  6:54 AM  Result Value Ref Range   Cholesterol 172 0 - 200 mg/dL   Triglycerides 76 <150 mg/dL   HDL 71 >40 mg/dL   Total CHOL/HDL Ratio 2.4 RATIO   VLDL 15 0 - 40 mg/dL   LDL Cholesterol 86 0 - 99 mg/dL    Comment:        Total Cholesterol/HDL:CHD Risk Coronary Heart Disease Risk Table                     Men   Women  1/2 Average Risk   3.4   3.3  Average Risk       5.0   4.4  2 X Average Risk   9.6   7.1  3 X Average Risk  23.4   11.0        Use the calculated Patient Ratio above and the CHD Risk Table to determine the patient's CHD Risk.        ATP III CLASSIFICATION (LDL):  <100     mg/dL   Optimal  100-129  mg/dL   Near or Above                    Optimal  130-159   mg/dL   Borderline  160-189  mg/dL   High  >190     mg/dL   Very High Performed at [facility] [address] [place], [place] [address]   TSH     Status: None   Collection Time: 09/09/20  6:54 AM  Result Value Ref Range   TSH 2.640 0.350 - 4.500 uIU/mL    Comment: Performed by a 3rd Generation assay with a functional sensitivity of <=0.01 uIU/mL. Performed at [facility], [place] [address] [place], [place] [address]   Glucose, capillary     Status: Abnormal   Collection Time: 09/09/20 11:53 AM  Result Value Ref Range   Glucose-Capillary 107 (H) 70 - 99 mg/dL    Comment: Glucose reference range applies only to samples taken after fasting for at least 8 hours.  Glucose, capillary     Status: Abnormal  Collection Time: 09/09/20  5:01 PM  Result Value Ref Range   Glucose-Capillary 177 (H) 70 - 99 mg/dL    Comment: Glucose reference range applies only to samples taken after fasting for at least 8 hours.  Glucose, capillary     Status: Abnormal   Collection Time: 09/09/20  8:13 PM  Result Value Ref Range   Glucose-Capillary 191 (H) 70 - 99 mg/dL    Comment: Glucose reference range applies only to samples taken after fasting for at least 8 hours.  Glucose, capillary     Status: None   Collection Time: 09/10/20  6:23 AM  Result Value Ref Range   Glucose-Capillary 95 70 - 99 mg/dL    Comment: Glucose reference range applies only to samples taken after fasting for at least 8 hours.    Blood Alcohol level:  Lab Results  Component Value Date   ETH <10 09/06/2020   ETH <10 41/93/7902    Metabolic Disorder Labs: Lab Results  Component Value Date   HGBA1C 4.8 09/07/2020   MPG 91.06 09/07/2020   MPG 99.67 09/25/2019   No results found for: PROLACTIN Lab Results  Component Value Date   CHOL 172 09/09/2020   TRIG 76 09/09/2020   HDL 71 09/09/2020   CHOLHDL 2.4 09/09/2020   VLDL 15 09/09/2020   LDLCALC 86 09/09/2020   LDLCALC 114 (H)  09/25/2019    Physical Findings: AIMS: Facial and Oral Movements Muscles of Facial Expression: None, normal Lips and Perioral Area: None, normal Jaw: None, normal Tongue: None, normal,Extremity Movements Upper (arms, wrists, hands, fingers): None, normal Lower (legs, knees, ankles, toes): None, normal, Trunk Movements Neck, shoulders, hips: None, normal, Overall Severity Severity of abnormal movements (highest score from questions above): None, normal Incapacitation due to abnormal movements: None, normal Patient's awareness of abnormal movements (rate only patient's report): No Awareness, Dental Status Current problems with teeth and/or dentures?: No Does patient usually wear dentures?: No  CIWA:  CIWA-Ar Total: 0 COWS:     Musculoskeletal: Strength & Muscle Tone: within normal limits Gait & Station: normal Patient leans: N/A  Psychiatric Specialty Exam: Physical Exam Constitutional:      Appearance: Normal appearance.  HENT:     Head: Normocephalic and atraumatic.     Nose: Nose normal.  Pulmonary:     Effort: Pulmonary effort is normal.  Neurological:     Mental Status: He is alert and oriented to person, place, and time.   Review of Systems  Constitutional: Positive for fatigue.  Psychiatric/Behavioral: Positive for decreased concentration, dysphoric mood. Negative for hallucinations, sleep disturbance, and suicidal ideas.   Blood pressure (!) 136/91, pulse 94, temperature 98.7 F (37.1 C), temperature source Oral, resp. rate 18, height 6\' 4"  (1.93 m), weight 118.8 kg, SpO2 98 %.Body mass index is 31.89 kg/m.  General Appearance: Casual  Eye Contact:  Fair  Speech:  Normal Rate  Volume:  Decreased  Mood:  Depressed  Affect:  Congruent and Depressed  Thought Process:  Coherent  Orientation:  Full (Time, Place, and Person)  Thought Content:  Logical  Suicidal Thoughts:  No  Homicidal Thoughts:  No  Memory:  Immediate;   Fair Recent;   Fair Remote;   Fair   Judgement:  Fair  Insight:  Fair  Psychomotor Activity:  Normal  Concentration:  Concentration: Fair and Attention Span: Fair  Recall:  [employer] of Knowledge:  Fair  Language:  Good  Akathisia:  No  Handed:  Right  AIMS (  if indicated):     Assets:  Communication Skills Desire for Improvement Leisure Time Resilience  ADL's:  Intact  Cognition:  WNL  Sleep:        Treatment Plan Summary: Daily contact with patient to assess and evaluate symptoms and progress in treatment and Medication management  Problem #1: Major Depressive Disorder, recurrent, severe He previously endorsed anhedonia, sleep disturbances, hopelessness, decreased energy, and suicidal thoughts with intentions. He also appeared with a flat and depressed affect on mental status exam; this is most consistent with Major Depressive Disorder. He did endorse vague auditory hallucinations, which have been chronic at this point. However, he does not appear to be responding to internal stimuli. Continue with the plan is as below:   - Increased Abilify to 10 mg daily 09/10/20 for mood augmentation and questionable component of psychosis  - Restart Wellbutrin XL 150 mg daily, increase to 300 mg daily starting 11/11 for mood  - Atarax 25 mg PO TID PRN for anxiety  - Trazodone 25 mg PO at bedtime for sleep    Problem #2: Polysubstance use disorder  Reports the he drinks about 2-3 40 oz beers/day, which triggers his cocaine use. He admits that these are contributing to his mental health deterioration and is partly why he desires outpatient therapy once his medications are optimized. Thus, his insight is fair. His most recent UDS was (+) for cocaine. He has never had a history of seizure, or signs of withdrawal otherwise. His last alcohol and drug use was Friday. He has not had symptoms of withdrawal and denies palpitations, tremors, and increased anxiety from baseline.   - Folic acid 1 mg daily & thiamine PO 100 mg daily for  nutritional support  - Nicoderm patch 21 mg daily for tobacco use - Imodium, MgOH, Zofran each PRN for GI symptoms   Problem #3: Chronic conditions  Continue home medications as below:  - albuterol PRN, Duoneb, prednisone 40 mg daily for 3 days total for COPD - amlodipine 10 mg PO daily for hypertension  - aspirin 81 mg daily, Lipitor 40 mg daily for CAD  Lenore Manner, Medical Student 09/10/2020, 8:56 AM

## 2020-09-10 NOTE — Progress Notes (Signed)
Pt kept to himself much of the evening      09/10/20 2300  Psych Admission Type (Psych Patients Only)  Admission Status Voluntary  Psychosocial Assessment  Patient Complaints Anxiety;Depression  Eye Contact Brief  Facial Expression Anxious;Sullen;Sad;Worried  Affect Anxious;Sad;Sullen  Theatre stage manager  Appearance/Hygiene Unremarkable  Behavior Characteristics Cooperative  Mood Depressed  Thought Process  Coherency WDL  Content Blaming self  Delusions None reported or observed  Perception WDL  Hallucination None reported or observed  Judgment Poor  Confusion None  Danger to Self  Current suicidal ideation? Denies  Danger to Others  Danger to Others None reported or observed

## 2020-09-10 NOTE — [facility] Group Notes (Signed)
°  The focus of this group is to help patients establish daily goals to achieve during treatment and discuss how the patient can incorporate goal setting into their daily lives to aide in recovery.   Patient attended group and though could not contribute because he couldn't see the text, he was still attentive and contributed where he could.

## 2020-09-10 NOTE — Plan of Care (Signed)
Nurse discussed anxiety, depression, coping skills with patient. 

## 2020-09-10 NOTE — Progress Notes (Signed)
D:  Patient's self inventory sheet, patient sleeps good.  Sleep medication helpful.  Good appetite, low energy level, good concentration.  Rated depression, hopeless and anxiety 8.  Denied withdrawals.  Denied SI.  Denied physical problems.  Denied physical pain.  Goal is work on depression.  Plans to take medications.  Does have discharge plans. A:  Medications administered per MD orders.  Emotional support and encouragement given patient. R:  Denied SI and HI, contracts for safety.  Denied A/V hallucinations.  Safety maintained with 15 minute checks.

## 2020-09-11 LAB — GLUCOSE, CAPILLARY: Glucose-Capillary: 94 mg/dL (ref 70–99)

## 2020-09-11 MED ORDER — WHITE PETROLATUM EX OINT
TOPICAL_OINTMENT | CUTANEOUS | Status: AC
  Filled 2020-09-11: qty 5

## 2020-09-11 NOTE — [facility] Group Notes (Signed)
The focus of this group is to help patients establish daily goals to achieve during treatment and discuss how the patient can incorporate goal setting into their daily lives to aide in recovery.   Patient attended group and was attentive and contributed to group.

## 2020-09-11 NOTE — Progress Notes (Signed)
[facility] MD Progress Note  09/11/2020 9:53 AM Robert Chan  MRN:  [id number]  Daily Note: The patient was examined, assessed, and discussed with treatment team. Overnight, the patient reports sleeping well with no issues. His appetite has been good as well and he enjoyed his dinner yesterday. He is tolerating medications well and is cooperative with group therapy and activities. He states he understands why his medications are being increased and is agreeable to this. Reports that his mood is slightly improved, sometimes up and down, but understands his medications will take some time to become effective. His affect seems to be improving this morning. He denies SI, HI, and AVH this morning.      Principal Problem: <principal problem not specified> Diagnosis: Active Problems:   MDD (major depressive disorder), recurrent episode, severe (HCC)  Total Time spent with patient: 15 minutes  Past Psychiatric History: see H&P  Past Medical History:  Past Medical History:  Diagnosis Date  . Asthma   . CAD (coronary artery disease)   . Cataract   . CKD (chronic kidney disease)   . COPD (chronic obstructive pulmonary disease) ([place])   . Depression   . Hypertension   . NSTEMI (non-ST elevated myocardial infarction) [facility])     Past Surgical History:  Procedure Laterality Date  . APPENDECTOMY    . EYE SURGERY    . LEFT HEART CATH AND CORONARY ANGIOGRAPHY N/A 06/05/2020   Procedure: LEFT HEART CATH AND CORONARY ANGIOGRAPHY;  Surgeon: Nigel Mormon, MD;  Location: [facility];  Service: Cardiovascular;  Laterality: N/A;  . PROSTATE SURGERY     Family History:  Family History  Problem Relation Age of Onset  . Hypertension Mother   . Diabetes Mother   . Hypertension Father   . Hypertension Sister    Family Psychiatric  History: see H&P Social History:  Social History   Substance and Sexual Activity  Alcohol Use Yes   Comment: drinks 2x/wk, 2-3 40 oz drinks, last drink Friday      Social History   Substance and Sexual Activity  Drug Use Yes  . Types: Cocaine   Comment: cocaine use once a month, last use Friday     Social History   Socioeconomic History  . Marital status: Single    Spouse name: Not on file  . Number of children: Not on file  . Years of education: Not on file  . Highest education level: Not on file  Occupational History  . Not on file  Tobacco Use  . Smoking status: Current Every Day Smoker  . Smokeless tobacco: Never Used  . Tobacco comment: smokes 3-4 cigarettes/day  Substance and Sexual Activity  . Alcohol use: Yes    Comment: drinks 2x/wk, 2-3 40 oz drinks, last drink Friday  . Drug use: Yes    Types: Cocaine    Comment: cocaine use once a month, last use Friday   . Sexual activity: Not Currently  Other Topics Concern  . Not on file  Social History Narrative  . Not on file   Social Determinants of Health   Financial Resource Strain:   . Difficulty of Paying Living Expenses: Not on file  Food Insecurity:   . Worried About Charity fundraiser in the Last Year: Not on file  . Ran Out of Food in the Last Year: Not on file  Transportation Needs:   . Lack of Transportation (Medical): Not on file  . Lack of Transportation (Non-Medical): Not on  file  Physical Activity:   . Days of Exercise per Week: Not on file  . Minutes of Exercise per Session: Not on file  Stress:   . Feeling of Stress : Not on file  Social Connections:   . Frequency of Communication with Friends and Family: Not on file  . Frequency of Social Gatherings with Friends and Family: Not on file  . Attends Religious Services: Not on file  . Active Member of Clubs or Organizations: Not on file  . Attends Archivist Meetings: Not on file  . Marital Status: Not on file   Additional Social History:    Currently living with [employer], has about a month remaining. He is open to resources once he is discharged.   Sleep: Fair  Appetite:   Fair  Current Medications: Current Facility-Administered Medications  Medication Dose Route Frequency Provider Last Rate Last Admin  . white petrolatum (VASELINE) gel           . acetaminophen (TYLENOL) tablet 650 mg  650 mg Oral Q6H PRN Connye Burkitt, NP      . albuterol (VENTOLIN HFA) 108 (90 Base) MCG/ACT inhaler 1-2 puff  1-2 puff Inhalation Q6H PRN Connye Burkitt, NP   2 puff at 09/10/20 2057  . alum & mag hydroxide-simeth (MAALOX/MYLANTA) 200-200-20 MG/5ML suspension 30 mL  30 mL Oral Q4H PRN Connye Burkitt, NP      . amLODipine (NORVASC) tablet 10 mg  10 mg Oral Daily Connye Burkitt, NP   10 mg at 09/11/20 0837  . ARIPiprazole (ABILIFY) tablet 10 mg  10 mg Oral Daily Bubber Rothert, Dorene Ar, MD   10 mg at 09/11/20 0837  . aspirin EC tablet 81 mg  81 mg Oral Daily Connye Burkitt, NP   81 mg at 09/11/20 0837  . atorvastatin (LIPITOR) tablet 40 mg  40 mg Oral Daily Connye Burkitt, NP   40 mg at 09/11/20 0837  . buPROPion (WELLBUTRIN XL) 24 hr tablet 300 mg  300 mg Oral Daily Lorilynn Lehr, Dorene Ar, MD   300 mg at 09/11/20 0838  . fluticasone furoate-vilanterol (BREO ELLIPTA) 100-25 MCG/INH 1 puff  1 puff Inhalation Daily Connye Burkitt, NP   1 puff at 09/11/20 0839  . folic acid (FOLVITE) tablet 1 mg  1 mg Oral Daily Connye Burkitt, NP   1 mg at 09/11/20 2836  . hydrOXYzine (ATARAX/VISTARIL) tablet 25 mg  25 mg Oral TID PRN Connye Burkitt, NP   25 mg at 09/10/20 2056  . influenza vaccine adjuvanted (FLUAD) injection 0.5 mL  0.5 mL Intramuscular Tomorrow-1000 Nwoko, Uchenna E, PA      . insulin aspart (novoLOG) injection 0-6 Units  0-6 Units Subcutaneous TID WC Connye Burkitt, NP      . ipratropium-albuterol (DUONEB) 0.5-2.5 (3) MG/3ML nebulizer solution 3 mL  3 mL Nebulization Q6H PRN Connye Burkitt, NP      . loperamide (IMODIUM) capsule 2-4 mg  2-4 mg Oral PRN Connye Burkitt, NP      . LORazepam (ATIVAN) tablet 1 mg  1 mg Oral Q6H PRN Connye Burkitt, NP      . magnesium hydroxide (MILK OF  MAGNESIA) suspension 30 mL  30 mL Oral Daily PRN Connye Burkitt, NP      . multivitamin with minerals tablet 1 tablet  1 tablet Oral Daily Connye Burkitt, NP   1 tablet at 09/11/20 0837  . nicotine (  NICODERM CQ - dosed in mg/24 hours) patch 21 mg  21 mg Transdermal Daily Connye Burkitt, NP      . ondansetron (ZOFRAN-ODT) disintegrating tablet 4 mg  4 mg Oral Q6H PRN Connye Burkitt, NP      . thiamine tablet 100 mg  100 mg Oral Daily Connye Burkitt, NP   100 mg at 09/11/20 0837  . traZODone (DESYREL) tablet 25 mg  25 mg Oral QHS PRN,MR X 1 Connye Burkitt, NP   25 mg at 09/10/20 2056    Lab Results:  Results for orders placed or performed during the hospital encounter of 09/08/20 (from the past 48 hour(s))  Glucose, capillary     Status: Abnormal   Collection Time: 09/09/20 11:53 AM  Result Value Ref Range   Glucose-Capillary 107 (H) 70 - 99 mg/dL    Comment: Glucose reference range applies only to samples taken after fasting for at least 8 hours.  Glucose, capillary     Status: Abnormal   Collection Time: 09/09/20  5:01 PM  Result Value Ref Range   Glucose-Capillary 177 (H) 70 - 99 mg/dL    Comment: Glucose reference range applies only to samples taken after fasting for at least 8 hours.  Glucose, capillary     Status: Abnormal   Collection Time: 09/09/20  8:13 PM  Result Value Ref Range   Glucose-Capillary 191 (H) 70 - 99 mg/dL    Comment: Glucose reference range applies only to samples taken after fasting for at least 8 hours.  Glucose, capillary     Status: None   Collection Time: 09/10/20  6:23 AM  Result Value Ref Range   Glucose-Capillary 95 70 - 99 mg/dL    Comment: Glucose reference range applies only to samples taken after fasting for at least 8 hours.  Glucose, capillary     Status: Abnormal   Collection Time: 09/10/20  8:47 PM  Result Value Ref Range   Glucose-Capillary 224 (H) 70 - 99 mg/dL    Comment: Glucose reference range applies only to samples taken after fasting  for at least 8 hours.  Glucose, capillary     Status: None   Collection Time: 09/11/20  6:07 AM  Result Value Ref Range   Glucose-Capillary 94 70 - 99 mg/dL    Comment: Glucose reference range applies only to samples taken after fasting for at least 8 hours.   Comment 1 Notify RN    Comment 2 Document in Chart     Blood Alcohol level:  Lab Results  Component Value Date   ETH <10 09/06/2020   ETH <10 03/50/0938    Metabolic Disorder Labs: Lab Results  Component Value Date   HGBA1C 4.8 09/07/2020   MPG 91.06 09/07/2020   MPG 99.67 09/25/2019   No results found for: PROLACTIN Lab Results  Component Value Date   CHOL 172 09/09/2020   TRIG 76 09/09/2020   HDL 71 09/09/2020   CHOLHDL 2.4 09/09/2020   VLDL 15 09/09/2020   LDLCALC 86 09/09/2020   LDLCALC 114 (H) 09/25/2019    Physical Findings: AIMS: Facial and Oral Movements Muscles of Facial Expression: None, normal Lips and Perioral Area: None, normal Jaw: None, normal Tongue: None, normal,Extremity Movements Upper (arms, wrists, hands, fingers): None, normal Lower (legs, knees, ankles, toes): None, normal, Trunk Movements Neck, shoulders, hips: None, normal, Overall Severity Severity of abnormal movements (highest score from questions above): None, normal Incapacitation due to abnormal movements: None, normal Patient's awareness  of abnormal movements (rate only patient's report): No Awareness, Dental Status Current problems with teeth and/or dentures?: No Does patient usually wear dentures?: No  CIWA:  CIWA-Ar Total: 1 COWS:     Musculoskeletal: Strength & Muscle Tone: within normal limits Gait & Station: normal Patient leans: N/A  Psychiatric Specialty Exam: Physical Exam Constitutional:      Appearance: Normal appearance.  HENT:     Head: Normocephalic and atraumatic.     Nose: Nose normal.  Pulmonary:     Effort: Pulmonary effort is normal.  Neurological:     Mental Status: He is alert and oriented  to person, place, and time.   Review of Systems  Psychiatric/Behavioral: Negative for hallucinations, sleep disturbance, and suicidal ideas.   Blood pressure 131/68, pulse 78, temperature 98.7 F (37.1 C), temperature source Oral, resp. rate 18, height 6\' 4"  (1.93 m), weight 118.8 kg, SpO2 98 %.Body mass index is 31.89 kg/m.  General Appearance: Casual  Eye Contact:  Fair  Speech:  Normal Rate  Volume:  Normal  Mood:  Dysphoric  Affect:  Appropriate and Congruent  Thought Process:  Coherent  Orientation:  Full (Time, Place, and Person)  Thought Content:  Logical  Suicidal Thoughts:  No  Homicidal Thoughts:  No  Memory:  Immediate;   Fair Recent;   Fair Remote;   Fair  Judgement:  Fair  Insight:  Fair  Psychomotor Activity:  Normal  Concentration:  Concentration: Fair and Attention Span: Fair  Recall:  [employer] of Knowledge:  Fair  Language:  Good  Akathisia:  No  Handed:  Right  AIMS (if indicated):     Assets:  Communication Skills Desire for Improvement Leisure Time Resilience  ADL's:  Intact  Cognition:  WNL  Sleep:  Number of Hours: 6.75     Treatment Plan Summary: Daily contact with patient to assess and evaluate symptoms and progress in treatment and Medication management  Problem #1: Major Depressive Disorder, recurrent, severe Patient has been active in group sessions and adherent to medications. He expresses some limited insight to why he's requiring medications. He is tolerating medications well. His affect is appropriate, and he has some mood reactivity and seems to be benefiting from the plan below. His presentation was most consistent with Major Depressive Disorder. He did endorse vague auditory hallucinations, which have been chronic at this point. However, he does not appear to be responding to internal stimuli. - Increased Abilify to 10 mg daily 09/10/20 for mood augmentation and questionable component of psychosis  - Increased Wellbutrin XL to 300 mg  daily starting 11/11 for mood  - Atarax 25 mg PO TID PRN for anxiety  - Trazodone 25 mg PO at bedtime for sleep   Problem #2: Polysubstance use disorder  Reported use of alcohol, cocaine, and tobacco for several years. He has never had withdrawal in the past and has not shown any symptoms while admitted.  - Folic acid 1 mg daily & thiamine PO 100 mg daily for nutritional support  - Nicoderm patch 21 mg daily for tobacco use - Imodium, MgOH, Zofran each PRN for GI symptoms   Problem #3: Chronic conditions  Continue home medications as below:  - albuterol PRN, Breo-Ellipta for COPD; prednisone discontinued  - amlodipine 10 mg PO daily for hypertension  - aspirin 81 mg daily, Lipitor 40 mg daily for CAD  Lenore Manner, Medical Student 09/11/2020, 9:53 AM

## 2020-09-11 NOTE — Progress Notes (Signed)
   09/11/20 2300  Psych Admission Type (Psych Patients Only)  Admission Status Voluntary  Psychosocial Assessment  Patient Complaints Anxiety;Depression  Eye Contact Brief  Facial Expression Anxious;Sullen;Sad;Worried  Affect Anxious;Sad;Sullen  Theatre stage manager  Appearance/Hygiene Unremarkable  Behavior Characteristics Cooperative  Mood Depressed  Thought Process  Coherency WDL  Content Blaming self  Delusions None reported or observed  Perception WDL  Hallucination None reported or observed  Judgment Poor  Confusion None  Danger to Self  Current suicidal ideation? Denies  Danger to Others  Danger to Others None reported or observed

## 2020-09-12 LAB — GLUCOSE, CAPILLARY: Glucose-Capillary: 99 mg/dL (ref 70–99)

## 2020-09-12 NOTE — [facility] Group Notes (Signed)
[place] LCSW Group Therapy  09/12/2020 2:37 PM  Type of Therapy:  Coping Skills  Participation Level:  Did Not Attend  Participation Quality:  Did not attend   Affect:  Did not attend  Cognitive:  Did not attend   Insight:  Did not attend   Engagement in Therapy:  Did not attend   Modes of Intervention:  Did not attend   Summary of Progress/Problems: Pt did not attend the group   Darleen Crocker 09/12/2020, 2:37 PM

## 2020-09-12 NOTE — Progress Notes (Signed)
   09/12/20 2300  Psych Admission Type (Psych Patients Only)  Admission Status Voluntary  Psychosocial Assessment  Patient Complaints Anxiety  Eye Contact Brief  Facial Expression Anxious;Sullen;Sad  Affect Anxious;Sad;Sullen  Speech Logical/coherent  Interaction Assertive  Motor Activity Fidgety  Appearance/Hygiene In hospital gown  Behavior Characteristics Cooperative  Mood Depressed  Thought Process  Coherency WDL  Content WDL  Delusions None reported or observed  Perception WDL  Hallucination None reported or observed  Judgment Poor  Confusion None  Danger to Self  Current suicidal ideation? Denies  Self-Injurious Behavior No self-injurious ideation or behavior indicators observed or expressed   Danger to Others  Danger to Others None reported or observed

## 2020-09-12 NOTE — Progress Notes (Signed)
Recreation Therapy Notes  Date:  1.22.20 Time: 0930 Location: [address] Group Room  Group Topic: Stress Management  Goal Area(s) Addresses:  Patient will identify positive stress management techniques. Patient will identify benefits of using stress management post d/c.  Intervention: Stress Management  Activity: Meditation.  LRT played a meditation that focused on the pure possibilities in your day.  Patients were to listen and follow along as meditation played to fully engage.    Education:  Stress Management, Discharge Planning.   Education Outcome: Acknowledges Education  Clinical Observations/Feedback: Pt did not attend group activity.    Victorino Sparrow, LRT/CTRS         Victorino Sparrow A 09/12/2020 12:04 PM

## 2020-09-12 NOTE — Progress Notes (Signed)
The focus of this group is to help patients establish daily goals to achieve during treatment and discuss how the patient can incorporate goal setting into their daily lives to aide in recovery.  The patient attended group.

## 2020-09-12 NOTE — Progress Notes (Signed)
Patient ID: Robert Chan, male   DOB: 1956/02/08, 64 y.o.   MRN: [id number]   [facility] MD Progress Note  09/12/2020 2:04 PM Robert Chan  MRN:  [id number]  Daily Note: The patient was examined, assessed, and discussed with treatment team.  Patient reports a decent night of sleep.  He states that his sleep is starting to improve more and more every day.  His mood still remains low although also improving.  Patient is seen attending some groups and interacting with peers appropriately.  He is making his needs known and is in good behavioral control.  He is agreeable with the plan to continue medications at their current dosages to observe for further improvement.   No adverse effects of the medication reported or observed.    Principal Problem: <principal problem not specified> Diagnosis: Active Problems:   MDD (major depressive disorder), recurrent episode, severe (HCC)  Total Time spent with patient: 20 minutes  Past Psychiatric History: see H&P  Past Medical History:  Past Medical History:  Diagnosis Date  . Asthma   . CAD (coronary artery disease)   . Cataract   . CKD (chronic kidney disease)   . COPD (chronic obstructive pulmonary disease) ([place])   . Depression   . Hypertension   . NSTEMI (non-ST elevated myocardial infarction) [facility])     Past Surgical History:  Procedure Laterality Date  . APPENDECTOMY    . EYE SURGERY    . LEFT HEART CATH AND CORONARY ANGIOGRAPHY N/A 06/05/2020   Procedure: LEFT HEART CATH AND CORONARY ANGIOGRAPHY;  Surgeon: Nigel Mormon, MD;  Location: [place] CV LAB;  Service: Cardiovascular;  Laterality: N/A;  . PROSTATE SURGERY     Family History:  Family History  Problem Relation Age of Onset  . Hypertension Mother   . Diabetes Mother   . Hypertension Father   . Hypertension Sister    Family Psychiatric  History: see H&P Social History:  Social History   Substance and Sexual Activity  Alcohol Use Yes   Comment: drinks  2x/wk, 2-3 40 oz drinks, last drink Friday     Social History   Substance and Sexual Activity  Drug Use Yes  . Types: Cocaine   Comment: cocaine use once a month, last use Friday     Social History   Socioeconomic History  . Marital status: Single    Spouse name: Not on file  . Number of children: Not on file  . Years of education: Not on file  . Highest education level: Not on file  Occupational History  . Not on file  Tobacco Use  . Smoking status: Current Every Day Smoker  . Smokeless tobacco: Never Used  . Tobacco comment: smokes 3-4 cigarettes/day  Substance and Sexual Activity  . Alcohol use: Yes    Comment: drinks 2x/wk, 2-3 40 oz drinks, last drink Friday  . Drug use: Yes    Types: Cocaine    Comment: cocaine use once a month, last use Friday   . Sexual activity: Not Currently  Other Topics Concern  . Not on file  Social History Narrative  . Not on file   Social Determinants of Health   Financial Resource Strain:   . Difficulty of Paying Living Expenses: Not on file  Food Insecurity:   . Worried About Charity fundraiser in the Last Year: Not on file  . Ran Out of Food in the Last Year: Not on file  Transportation Needs:   . Lack  of Transportation (Medical): Not on file  . Lack of Transportation (Non-Medical): Not on file  Physical Activity:   . Days of Exercise per Week: Not on file  . Minutes of Exercise per Session: Not on file  Stress:   . Feeling of Stress : Not on file  Social Connections:   . Frequency of Communication with Friends and Family: Not on file  . Frequency of Social Gatherings with Friends and Family: Not on file  . Attends Religious Services: Not on file  . Active Member of Clubs or Organizations: Not on file  . Attends Archivist Meetings: Not on file  . Marital Status: Not on file   Additional Social History:    Currently living with [employer], has about a month remaining. He is open to resources once  he is discharged.   Sleep: Fair  Appetite:  Fair  Current Medications: Current Facility-Administered Medications  Medication Dose Route Frequency Provider Last Rate Last Admin  . acetaminophen (TYLENOL) tablet 650 mg  650 mg Oral Q6H PRN Connye Burkitt, NP      . albuterol (VENTOLIN HFA) 108 (90 Base) MCG/ACT inhaler 1-2 puff  1-2 puff Inhalation Q6H PRN Connye Burkitt, NP   2 puff at 09/10/20 2057  . alum & mag hydroxide-simeth (MAALOX/MYLANTA) 200-200-20 MG/5ML suspension 30 mL  30 mL Oral Q4H PRN Connye Burkitt, NP      . amLODipine (NORVASC) tablet 10 mg  10 mg Oral Daily Connye Burkitt, NP   10 mg at 09/12/20 0753  . ARIPiprazole (ABILIFY) tablet 10 mg  10 mg Oral Daily Tonyia Marschall, Dorene Ar, MD   10 mg at 09/12/20 0753  . aspirin EC tablet 81 mg  81 mg Oral Daily Connye Burkitt, NP   81 mg at 09/12/20 0753  . atorvastatin (LIPITOR) tablet 40 mg  40 mg Oral Daily Connye Burkitt, NP   40 mg at 09/12/20 0753  . buPROPion (WELLBUTRIN XL) 24 hr tablet 300 mg  300 mg Oral Daily Naksh Radi, Dorene Ar, MD   300 mg at 09/12/20 0753  . fluticasone furoate-vilanterol (BREO ELLIPTA) 100-25 MCG/INH 1 puff  1 puff Inhalation Daily Connye Burkitt, NP   1 puff at 09/12/20 0753  . folic acid (FOLVITE) tablet 1 mg  1 mg Oral Daily Connye Burkitt, NP   1 mg at 09/12/20 0753  . hydrOXYzine (ATARAX/VISTARIL) tablet 25 mg  25 mg Oral TID PRN Connye Burkitt, NP   25 mg at 09/10/20 2056  . influenza vaccine adjuvanted (FLUAD) injection 0.5 mL  0.5 mL Intramuscular Tomorrow-1000 Nwoko, Uchenna E, PA      . ipratropium-albuterol (DUONEB) 0.5-2.5 (3) MG/3ML nebulizer solution 3 mL  3 mL Nebulization Q6H PRN Connye Burkitt, NP      . magnesium hydroxide (MILK OF MAGNESIA) suspension 30 mL  30 mL Oral Daily PRN Connye Burkitt, NP      . multivitamin with minerals tablet 1 tablet  1 tablet Oral Daily Connye Burkitt, NP   1 tablet at 09/12/20 0753  . nicotine (NICODERM CQ - dosed in mg/24 hours) patch 21 mg  21 mg  Transdermal Daily Connye Burkitt, NP      . thiamine tablet 100 mg  100 mg Oral Daily Connye Burkitt, NP   100 mg at 09/12/20 0753  . traZODone (DESYREL) tablet 25 mg  25 mg Oral QHS PRN,MR X 1 Connye Burkitt, NP  25 mg at 09/10/20 2056    Lab Results:  Results for orders placed or performed during the hospital encounter of 09/08/20 (from the past 48 hour(s))  Glucose, capillary     Status: Abnormal   Collection Time: 09/10/20  8:47 PM  Result Value Ref Range   Glucose-Capillary 224 (H) 70 - 99 mg/dL    Comment: Glucose reference range applies only to samples taken after fasting for at least 8 hours.  Glucose, capillary     Status: None   Collection Time: 09/11/20  6:07 AM  Result Value Ref Range   Glucose-Capillary 94 70 - 99 mg/dL    Comment: Glucose reference range applies only to samples taken after fasting for at least 8 hours.   Comment 1 Notify RN    Comment 2 Document in Chart   Glucose, capillary     Status: None   Collection Time: 09/12/20  6:12 AM  Result Value Ref Range   Glucose-Capillary 99 70 - 99 mg/dL    Comment: Glucose reference range applies only to samples taken after fasting for at least 8 hours.   Comment 1 Notify RN    Comment 2 Document in Chart     Blood Alcohol level:  Lab Results  Component Value Date   ETH <10 09/06/2020   ETH <10 89/37/3428    Metabolic Disorder Labs: Lab Results  Component Value Date   HGBA1C 4.8 09/07/2020   MPG 91.06 09/07/2020   MPG 99.67 09/25/2019   No results found for: PROLACTIN Lab Results  Component Value Date   CHOL 172 09/09/2020   TRIG 76 09/09/2020   HDL 71 09/09/2020   CHOLHDL 2.4 09/09/2020   VLDL 15 09/09/2020   LDLCALC 86 09/09/2020   LDLCALC 114 (H) 09/25/2019    Physical Findings: AIMS: Facial and Oral Movements Muscles of Facial Expression: None, normal Lips and Perioral Area: None, normal Jaw: None, normal Tongue: None, normal,Extremity Movements Upper (arms, wrists, hands, fingers):  None, normal Lower (legs, knees, ankles, toes): None, normal, Trunk Movements Neck, shoulders, hips: None, normal, Overall Severity Severity of abnormal movements (highest score from questions above): None, normal Incapacitation due to abnormal movements: None, normal Patient's awareness of abnormal movements (rate only patient's report): No Awareness, Dental Status Current problems with teeth and/or dentures?: No Does patient usually wear dentures?: No  CIWA:  CIWA-Ar Total: 0 COWS:     Musculoskeletal: Strength & Muscle Tone: within normal limits Gait & Station: normal Patient leans: N/A  Psychiatric Specialty Exam: Physical Exam Constitutional:      Appearance: Normal appearance.  HENT:     Head: Normocephalic and atraumatic.     Nose: Nose normal.  Pulmonary:     Effort: Pulmonary effort is normal.  Neurological:     Mental Status: He is alert and oriented to person, place, and time.   Review of Systems  Psychiatric/Behavioral: Negative for hallucinations, sleep disturbance, and suicidal ideas.   Blood pressure 111/82, pulse 91, temperature 98.2 F (36.8 C), temperature source Oral, resp. rate 20, height 6\' 4"  (1.93 m), weight 118.8 kg, SpO2 99 %.Body mass index is 31.89 kg/m.  General Appearance: Casual  Eye Contact:  Fair  Speech:  Normal Rate  Volume:  Normal  Mood:  Dysphoric  Affect:  Appropriate and Congruent  Thought Process:  Coherent  Orientation:  Full (Time, Place, and Person)  Thought Content:  Logical  Suicidal Thoughts:  No  Homicidal Thoughts:  No  Memory:  Immediate;  Fair Recent;   Fair Remote;   Fair  Judgement:  Fair  Insight:  Fair  Psychomotor Activity:  Normal  Concentration:  Concentration: Fair and Attention Span: Fair  Recall:  [employer] of Knowledge:  Fair  Language:  Good  Akathisia:  No  Handed:  Right  AIMS (if indicated):     Assets:  Communication Skills Desire for Improvement Leisure Time Resilience  ADL's:  Intact   Cognition:  WNL  Sleep:  Number of Hours: 6.75     Treatment Plan Summary: Daily contact with patient to assess and evaluate symptoms and progress in treatment and Medication management  Problem #1: Major Depressive Disorder, recurrent, severe Patient has been active in group sessions and adherent to medications. He expresses some limited insight to why he's requiring medications. He is tolerating medications well. His affect is appropriate, and he has some mood reactivity and seems to be benefiting from the plan below. His presentation was most consistent with Major Depressive Disorder. He did endorse vague auditory hallucinations, which have been chronic at this point. However, he does not appear to be responding to internal stimuli. -Continue Abilify to 10 mg daily 09/10/20 for mood augmentation and questionable component of psychosis  - Continue Wellbutrin XL to 300 mg daily starting 11/11 for mood  - Atarax 25 mg PO TID PRN for anxiety  - Trazodone 25 mg PO at bedtime for sleep   Problem #2: Polysubstance use disorder  Reported use of alcohol, cocaine, and tobacco for several years. He has never had withdrawal in the past and has not shown any symptoms while admitted.  - Folic acid 1 mg daily & thiamine PO 100 mg daily for nutritional support  - Nicoderm patch 21 mg daily for tobacco use - Imodium, MgOH, Zofran each PRN for GI symptoms   Problem #3: Chronic conditions  Continue home medications as below:  - albuterol PRN, Breo-Ellipta for COPD; prednisone discontinued  - amlodipine 10 mg PO daily for hypertension  - aspirin 81 mg daily, Lipitor 40 mg daily for CAD  Dixie Dials, MD 09/12/2020, 2:04 PM

## 2020-09-12 NOTE — Plan of Care (Signed)
Progress note  D: pt found in bed; compliant with medication administration. Pt continues to present anxious, sad, sullen, and depressed. Pt states they feel as though they are improving though. Pt continues to be minimal during assessment with poor eye contact. Pt did engage in appropriate self care this morning, asking for a change of clothes before their shower. Pt is pleasant. Pt denies si/hi/ah/vh and verbally agrees to approach staff if these become apparent or before harming themself/others while at [place].  A: Pt provided support and encouragement. Pt given medication per protocol and standing orders. Q5m safety checks implemented and continued.  R: Pt safe on the unit. Will continue to monitor.  Pt progressing in the following metrics  Problem: Safety: Goal: Periods of time without injury will increase Outcome: Progressing   Problem: Education: Goal: Utilization of techniques to improve thought processes will improve Outcome: Progressing   Problem: Activity: Goal: Imbalance in normal sleep/wake cycle will improve Outcome: Progressing   Problem: Health Behavior/Discharge Planning: Goal: Ability to make decisions will improve Outcome: Progressing

## 2020-09-13 DIAGNOSIS — Z720 Tobacco use: Secondary | ICD-10-CM | POA: Diagnosis not present

## 2020-09-13 DIAGNOSIS — F332 Major depressive disorder, recurrent severe without psychotic features: Secondary | ICD-10-CM | POA: Diagnosis not present

## 2020-09-13 DIAGNOSIS — J410 Simple chronic bronchitis: Secondary | ICD-10-CM | POA: Diagnosis not present

## 2020-09-13 DIAGNOSIS — F1410 Cocaine abuse, uncomplicated: Secondary | ICD-10-CM | POA: Diagnosis not present

## 2020-09-13 DIAGNOSIS — R69 Illness, unspecified: Secondary | ICD-10-CM | POA: Diagnosis not present

## 2020-09-13 DIAGNOSIS — R45851 Suicidal ideations: Secondary | ICD-10-CM | POA: Diagnosis not present

## 2020-09-13 DIAGNOSIS — I25118 Atherosclerotic heart disease of native coronary artery with other forms of angina pectoris: Secondary | ICD-10-CM

## 2020-09-13 DIAGNOSIS — E559 Vitamin D deficiency, unspecified: Secondary | ICD-10-CM

## 2020-09-13 MED ORDER — WHITE PETROLATUM EX OINT
TOPICAL_OINTMENT | CUTANEOUS | Status: AC
  Filled 2020-09-13: qty 5

## 2020-09-13 MED ORDER — ARIPIPRAZOLE 10 MG PO TABS
10.0000 mg | ORAL_TABLET | Freq: Every day | ORAL | Status: DC
  Administered 2020-09-14: 10 mg via ORAL
  Filled 2020-09-13 (×4): qty 1

## 2020-09-13 MED ORDER — VITAMIN D3 25 MCG PO TABS
1000.0000 [IU] | ORAL_TABLET | Freq: Every day | ORAL | Status: DC
  Administered 2020-09-14 – 2020-09-15 (×2): 1000 [IU] via ORAL
  Filled 2020-09-13 (×4): qty 1

## 2020-09-13 NOTE — Progress Notes (Addendum)
[facility] MD Progress Note  09/13/2020 6:06 PM Robert Chan  MRN:  [id number] Subjective: Patient stated he is feeling better today. He stated he slept well and his appetite is good. Patient reported that he is very sleepy during the daytime and asked if it could be any of the medications making him feel that way. Discussed moving Abilify to nighttime, patient would like to try and se if it helps. Patient denies suicidal and homicidal ideation. He stated he hears voices sometimes, worse when he is using cocaine. Patient minimizes his cocaine use, he stated he only uses every 2 months but when he does use he does not know when to stop. Patient has a history significant for cocaine abuse, COPD, CAD, anxiety and depression. Patient is calm and cooperative, attending group therapy and taking his medications as prescribed. He stated he is in a program at the [employer] that is helping him obtain housing. He stated he has his voucher for housing and is working on securing an apartment. In the meantime he is living at the [employer]. He denies access to weapons and stated he feels better today and is able to contract for safety.   Principal Problem: MDD (major depressive disorder), recurrent episode, severe ([place]) Diagnosis: Principal Problem:   MDD (major depressive disorder), recurrent episode, severe ([place]) Active Problems:   COPD (chronic obstructive pulmonary disease) (HCC)   Suicidal ideation   Tobacco abuse   Coronary artery disease   Vitamin D deficiency   Cocaine use disorder ([place])  Total Time spent with patient: 30 minutes  Past Psychiatric History: see H&P  Past Medical History:  Past Medical History:  Diagnosis Date  . Asthma   . CAD (coronary artery disease)   . Cataract   . CKD (chronic kidney disease)   . COPD (chronic obstructive pulmonary disease) ([place])   . Depression   . Hypertension   . NSTEMI (non-ST elevated myocardial infarction) [facility])     Past Surgical History:   Procedure Laterality Date  . APPENDECTOMY    . EYE SURGERY    . LEFT HEART CATH AND CORONARY ANGIOGRAPHY N/A 06/05/2020   Procedure: LEFT HEART CATH AND CORONARY ANGIOGRAPHY;  Surgeon: Nigel Mormon, MD;  Location: [place] CV LAB;  Service: Cardiovascular;  Laterality: N/A;  . PROSTATE SURGERY     Family History:  Family History  Problem Relation Age of Onset  . Hypertension Mother   . Diabetes Mother   . Hypertension Father   . Hypertension Sister    Family Psychiatric  History: See H&P Social History:  Social History   Substance and Sexual Activity  Alcohol Use Yes   Comment: drinks 2x/wk, 2-3 40 oz drinks, last drink Friday     Social History   Substance and Sexual Activity  Drug Use Yes  . Types: Cocaine   Comment: cocaine use once a month, last use Friday     Social History   Socioeconomic History  . Marital status: Single    Spouse name: Not on file  . Number of children: Not on file  . Years of education: Not on file  . Highest education level: Not on file  Occupational History  . Not on file  Tobacco Use  . Smoking status: Current Every Day Smoker  . Smokeless tobacco: Never Used  . Tobacco comment: smokes 3-4 cigarettes/day  Substance and Sexual Activity  . Alcohol use: Yes    Comment: drinks 2x/wk, 2-3 40 oz drinks, last drink Friday  .  Drug use: Yes    Types: Cocaine    Comment: cocaine use once a month, last use Friday   . Sexual activity: Not Currently  Other Topics Concern  . Not on file  Social History Narrative  . Not on file   Social Determinants of Health   Financial Resource Strain:   . Difficulty of Paying Living Expenses: Not on file  Food Insecurity:   . Worried About Charity fundraiser in the Last Year: Not on file  . Ran Out of Food in the Last Year: Not on file  Transportation Needs:   . Lack of Transportation (Medical): Not on file  . Lack of Transportation (Non-Medical): Not on file  Physical Activity:   . Days  of Exercise per Week: Not on file  . Minutes of Exercise per Session: Not on file  Stress:   . Feeling of Stress : Not on file  Social Connections:   . Frequency of Communication with Friends and Family: Not on file  . Frequency of Social Gatherings with Friends and Family: Not on file  . Attends Religious Services: Not on file  . Active Member of Clubs or Organizations: Not on file  . Attends Archivist Meetings: Not on file  . Marital Status: Not on file   Additional Social History:  Currently living with [employer], has about a month remaining. He is open to resources once he is discharged.      Sleep: Fair  Appetite:  Good  Current Medications: Current Facility-Administered Medications  Medication Dose Route Frequency Provider Last Rate Last Admin  . acetaminophen (TYLENOL) tablet 650 mg  650 mg Oral Q6H PRN Connye Burkitt, NP      . albuterol (VENTOLIN HFA) 108 (90 Base) MCG/ACT inhaler 1-2 puff  1-2 puff Inhalation Q6H PRN Connye Burkitt, NP   2 puff at 09/12/20 2133  . alum & mag hydroxide-simeth (MAALOX/MYLANTA) 200-200-20 MG/5ML suspension 30 mL  30 mL Oral Q4H PRN Connye Burkitt, NP      . amLODipine (NORVASC) tablet 10 mg  10 mg Oral Daily Connye Burkitt, NP   10 mg at 09/13/20 0827  . [START ON 09/14/2020] ARIPiprazole (ABILIFY) tablet 10 mg  10 mg Oral QHS Ethelene Hal, NP      . aspirin EC tablet 81 mg  81 mg Oral Daily Connye Burkitt, NP   81 mg at 09/13/20 0827  . atorvastatin (LIPITOR) tablet 40 mg  40 mg Oral Daily Connye Burkitt, NP   40 mg at 09/13/20 0827  . buPROPion (WELLBUTRIN XL) 24 hr tablet 300 mg  300 mg Oral Daily Cristofano, Dorene Ar, MD   300 mg at 09/13/20 0827  . fluticasone furoate-vilanterol (BREO ELLIPTA) 100-25 MCG/INH 1 puff  1 puff Inhalation Daily Connye Burkitt, NP   1 puff at 09/13/20 0826  . folic acid (FOLVITE) tablet 1 mg  1 mg Oral Daily Connye Burkitt, NP   1 mg at 09/13/20 0827  . hydrOXYzine  (ATARAX/VISTARIL) tablet 25 mg  25 mg Oral TID PRN Connye Burkitt, NP   25 mg at 09/12/20 2132  . influenza vaccine adjuvanted (FLUAD) injection 0.5 mL  0.5 mL Intramuscular Tomorrow-1000 Nwoko, Uchenna E, PA      . ipratropium-albuterol (DUONEB) 0.5-2.5 (3) MG/3ML nebulizer solution 3 mL  3 mL Nebulization Q6H PRN Connye Burkitt, NP      . magnesium hydroxide (MILK OF MAGNESIA) suspension  30 mL  30 mL Oral Daily PRN Connye Burkitt, NP      . multivitamin with minerals tablet 1 tablet  1 tablet Oral Daily Connye Burkitt, NP   1 tablet at 09/13/20 0827  . nicotine (NICODERM CQ - dosed in mg/24 hours) patch 21 mg  21 mg Transdermal Daily Connye Burkitt, NP      . thiamine tablet 100 mg  100 mg Oral Daily Connye Burkitt, NP   100 mg at 09/13/20 0827  . traZODone (DESYREL) tablet 25 mg  25 mg Oral QHS PRN,MR X 1 Connye Burkitt, NP   25 mg at 09/12/20 2132  . [START ON 09/14/2020] Vitamin D3 (Vitamin D) tablet 1,000 Units  1,000 Units Oral Daily Lavella Hammock, MD        Lab Results:  Results for orders placed or performed during the hospital encounter of 09/08/20 (from the past 48 hour(s))  Glucose, capillary     Status: None   Collection Time: 09/12/20  6:12 AM  Result Value Ref Range   Glucose-Capillary 99 70 - 99 mg/dL    Comment: Glucose reference range applies only to samples taken after fasting for at least 8 hours.   Comment 1 Notify RN    Comment 2 Document in Chart     Blood Alcohol level:  Lab Results  Component Value Date   ETH <10 09/06/2020   ETH <10 85/46/2703    Metabolic Disorder Labs: Lab Results  Component Value Date   HGBA1C 4.8 09/07/2020   MPG 91.06 09/07/2020   MPG 99.67 09/25/2019   No results found for: PROLACTIN Lab Results  Component Value Date   CHOL 172 09/09/2020   TRIG 76 09/09/2020   HDL 71 09/09/2020   CHOLHDL 2.4 09/09/2020   VLDL 15 09/09/2020   LDLCALC 86 09/09/2020   LDLCALC 114 (H) 09/25/2019    Physical Findings: AIMS: Facial and  Oral Movements Muscles of Facial Expression: None, normal Lips and Perioral Area: None, normal Jaw: None, normal Tongue: None, normal,Extremity Movements Upper (arms, wrists, hands, fingers): None, normal Lower (legs, knees, ankles, toes): None, normal, Trunk Movements Neck, shoulders, hips: None, normal, Overall Severity Severity of abnormal movements (highest score from questions above): None, normal Incapacitation due to abnormal movements: None, normal Patient's awareness of abnormal movements (rate only patient's report): No Awareness, Dental Status Current problems with teeth and/or dentures?: No Does patient usually wear dentures?: No  CIWA:  CIWA-Ar Total: 0 COWS:     Musculoskeletal: Strength & Muscle Tone: within normal limits Gait & Station: normal Patient leans: N/A  Psychiatric Specialty Exam: Physical Exam: Normal appearance  Review of Systems: Psychiatric/Behavioral Health: Negative for hallucinations, sleep disturbance and suicidal ideations  Blood pressure (!) 143/85, pulse 88, temperature 98.1 F (36.7 C), temperature source Oral, resp. rate 20, height 6\' 4"  (1.93 m), weight 118.8 kg, SpO2 100 %.Body mass index is 31.89 kg/m.  General Appearance: Casual, Fairly Groomed and dresssd in hospital attire  Eye Contact:  Good  Speech:  Clear and Coherent and Normal Rate  Volume:  Normal  Mood:  Euthymic  Affect:  Congruent and Depressed  Thought Process:  Coherent, Goal Directed and Linear  Orientation:  Full (Time, Place, and Person)  Thought Content:  Logical  Suicidal Thoughts:  No  Homicidal Thoughts:  No  Memory:  Immediate;   Good Recent;   Good Remote;   Good  Judgement:  Fair  Insight:  Fair  Psychomotor  Activity:  Normal  Concentration:  Concentration: Fair and Attention Span: Fair  Recall:  Good  Fund of Knowledge:  Fair  Language:  Good  Akathisia:  No  Handed:  Right  AIMS (if indicated):     Assets:  Communication  Skills Housing Resilience Social Support Talents/Skills  ADL's:  Intact  Cognition:  WNL  Sleep:  Number of Hours: 6.75     Treatment Plan Summary: Daily contact with patient to assess and evaluate symptoms and progress in treatment, Medication management and Plan medication managment  Problem #1: Major Depressive Disorder, recurrent, severe Patient has been active in group sessions and adherent to medications. He expresses some limited insight to why he's requiring medications. He is tolerating medications well. His affect is appropriate, and he has some mood reactivity and seems to be benefiting from the plan below. His presentation was most consistent with Major Depressive Disorder. He did endorse vague auditory hallucinations, which have been chronic at this point. However, he does not appear to be responding to internal stimuli. -Will Continue Abilify to 10 mg, schedule for nighttime form daily 09/10/20 for mood augmentation and questionable component of psychosis  - Continue Wellbutrin XL to 300 mg daily starting 11/11 for mood  - Atarax 25 mg PO TID PRN for anxiety  - Trazodone 25 mg PO at bedtime for sleep  Problem #2: Polysubstance use disorder Reported use of alcohol, cocaine, and tobacco for several years. He has never had withdrawal in the past and has not shown any symptoms while admitted.  - Folic acid 1 mg daily &thiamine PO 100 mg daily for nutritional support  - Nicoderm patch 21 mg daily for tobacco use - Imodium, MgOH, Zofran each PRN for GI symptoms  Problem #3:Chronic conditions  Continue home medications as below:  - albuterol PRN, Breo-Ellipta for COPD; prednisone discontinued  - amlodipine 10 mg PO daily for hypertension  - aspirin 81 mg daily, Lipitor 40 mg daily for CAD -Vitamin D 1000 units daily for hypovitaminosis D   Ethelene Hal, NP 09/13/2020, 10:51 AM   I have reviewed the note by NP, addended the note, and discussed the plan of  care.  I am in agreement with the assessment and plan.  Lavella Hammock, MD 09/13/2020, 6:06 PM

## 2020-09-13 NOTE — [facility] Group Notes (Signed)
.  Psychoeducational Group Note  Date: 09-12-20 Time: 0900-1000    Goal Setting   Purpose of Group: This group helps to provide patients with the steps of setting a goal that is specific, measurable, attainable, realistic and time specific. A discussion on how we keep ourselves stuck with negative self talk.    Participation Level:  Did not attend   Robert Chan

## 2020-09-13 NOTE — Progress Notes (Signed)
   09/13/20 2030  COVID-19 Daily Checkoff  Have you had a fever (temp > 37.80C/100F)  in the past 24 hours?  No  COVID-19 EXPOSURE  Have you traveled outside the state in the past 14 days? No  Have you been in contact with someone with a confirmed diagnosis of COVID-19 or PUI in the past 14 days without wearing appropriate PPE? No  Have you been living in the same home as a person with confirmed diagnosis of COVID-19 or a PUI (household contact)? No  Have you been diagnosed with COVID-19? No

## 2020-09-13 NOTE — Progress Notes (Signed)
[place] NOVEL CORONAVIRUS (COVID-19) DAILY CHECK-OFF SYMPTOMS - answer yes or no to each - every day NO YES  Have you had a fever in the past 24 hours?  . Fever (Temp > 37.80C / 100F) X   Have you had any of these symptoms in the past 24 hours? . New Cough .  Sore Throat  .  Shortness of Breath .  Difficulty Breathing .  Unexplained Body Aches   X   Have you had any one of these symptoms in the past 24 hours not related to allergies?   . Runny Nose .  Nasal Congestion .  Sneezing   X   If you have had runny nose, nasal congestion, sneezing in the past 24 hours, has it worsened?  X   EXPOSURES - check yes or no X   Have you traveled outside the state in the past 14 days?  X   Have you been in contact with someone with a confirmed diagnosis of COVID-19 or PUI in the past 14 days without wearing appropriate PPE?  X   Have you been living in the same home as a person with confirmed diagnosis of COVID-19 or a PUI (household contact)?    X   Have you been diagnosed with COVID-19?    X              What to do next: Answered NO to all: Answered YES to anything:   Proceed with unit schedule Follow the [facility] Inpatient Flowsheet.   

## 2020-09-13 NOTE — Progress Notes (Signed)
D. Pt presents with a sad affect congruent with mood- calm, cooperative behavior. Per pt's self inventory, pt rated his depression, hopelessness and anxiety all 8's. Pt reports that his goal today is to "work on depression". Pt observed attending group led by RN this afternoon.  Pt currently denies SI/HI and AVH A. Labs and vitals monitored. Pt given and educated on medications. Pt supported emotionally and encouraged to express concerns and ask questions.   R. Pt remains safe with 15 minute checks. Will continue POC.

## 2020-09-13 NOTE — [facility] Group Notes (Signed)
.  Psychoeducational Group Note    Date:09/13/2020 Time: 1300-1400    Life Skills:  A group where two lists are made. What people need and what are things that we do that are healthy. The lists are developed by the patients and it is explained that we often do the actions that are not healthy to get our list of needs met.   Purpose of Group: . The group focus' on teaching patients on how to identify their needs and how to develop the coping skills needed to get their needs met  Participation Level:  Active  Participation Quality:  Appropriate  Affect:  Appropriate  Cognitive:  Oriented  Insight:  Improving  Engagement in Group:  Engaged  Additional Comments:  Attended the group and rates his energy level at a 7. States he is feeling a bit better overall. Participated in the group and shared some insights  Paulino Rily

## 2020-09-13 NOTE — Progress Notes (Signed)
Patient rated his day as a 6 out of 10 since he watched television for much of the day. His goal for tomorrow is to get discharged. He states that the staff has already set up outpatient therapy for him.

## 2020-09-13 NOTE — [facility] Group Notes (Signed)
LCSW Group Therapy Note 09/13/2020  10:00-11:00am  Type of Therapy and Topic:  Group Therapy: Anger   Participation Level:  Minimal   Description of Group: In this group, patients identified a recent time they became angry and how they reacted.  They analyzed how their reaction was possibly beneficial and how it was possibly unhelpful.  They learned how to recognize the physical responses they have to anger-provoking situations that can alert them to the possibility that they are becoming angry.  They learned the difference between healthy coping skills that work and keep working versus unhealthy coping skills that work initially then start hurting. They were taught that anger is a secondary emotion fueled by an underlying feeling.  They explored what their own underlying emotions were during the incident they discussed in group.  Therapeutic Goals: Patients will remember their last incident of anger and how they felt emotionally and physically. Patients will identify how their behavior at that time worked for them, as well as how it worked against them. Patients will be able to identify their reaction as healthy or unhealthy, and identify possible reactions that would have been the opposite Patients will learn that anger itself is a secondary emotion and will think about their primary emotion at the time of their last incident of anger  Summary of Patient Progress:  The patient shared that his most recent time of anger was Thursday (2 days ago and said he has an argument with his daughter who was driving the car and texting at the same time.  He said that he told her to put the phone down, and she did then picked it back up again.  He got out of the car and walked the rest of the way home.  He feels this was a healthy way of coping with the situation.  Therapeutic Modalities:   Cognitive Behavioral Therapy  Maretta Los, LCSW 09/13/2020  9:23 AM

## 2020-09-14 DIAGNOSIS — F1410 Cocaine abuse, uncomplicated: Secondary | ICD-10-CM | POA: Diagnosis not present

## 2020-09-14 DIAGNOSIS — E559 Vitamin D deficiency, unspecified: Secondary | ICD-10-CM | POA: Diagnosis not present

## 2020-09-14 DIAGNOSIS — F332 Major depressive disorder, recurrent severe without psychotic features: Secondary | ICD-10-CM | POA: Diagnosis not present

## 2020-09-14 DIAGNOSIS — J410 Simple chronic bronchitis: Secondary | ICD-10-CM | POA: Diagnosis not present

## 2020-09-14 DIAGNOSIS — R69 Illness, unspecified: Secondary | ICD-10-CM | POA: Diagnosis not present

## 2020-09-14 DIAGNOSIS — I25118 Atherosclerotic heart disease of native coronary artery with other forms of angina pectoris: Secondary | ICD-10-CM | POA: Diagnosis not present

## 2020-09-14 DIAGNOSIS — R45851 Suicidal ideations: Secondary | ICD-10-CM | POA: Diagnosis not present

## 2020-09-14 DIAGNOSIS — Z720 Tobacco use: Secondary | ICD-10-CM | POA: Diagnosis not present

## 2020-09-14 MED ORDER — NAPHAZOLINE-GLYCERIN 0.012-0.2 % OP SOLN
1.0000 [drp] | Freq: Four times a day (QID) | OPHTHALMIC | Status: DC | PRN
  Administered 2020-09-14 – 2020-09-15 (×2): 2 [drp] via OPHTHALMIC
  Filled 2020-09-14: qty 15

## 2020-09-14 MED ORDER — WHITE PETROLATUM EX OINT
TOPICAL_OINTMENT | CUTANEOUS | Status: AC
  Filled 2020-09-14: qty 5

## 2020-09-14 NOTE — Progress Notes (Addendum)
[facility] MD Progress Note  09/14/2020 5:49 PM Robert Chan  MRN:  [id number] Subjective: Patient stated he is feeling better today. He stated he slept well and his appetite is good. Patient reported that his daytime sleepiness is better today. He appears more alert and awake. Patient denies suicidal and homicidal ideation. He stated he hears voices sometimes, worse when he is using cocaine. Patient minimizes his cocaine use, he stated he only uses every 2 months but when he does use he does not know when to stop. Patient has a history significant for cocaine abuse, COPD, CAD, anxiety and depression. Patient is calm and cooperative, attending group therapy and taking his medications as prescribed. He stated he is in a program at the [employer] that is helping him obtain housing. He stated he has his voucher for housing and is working on securing an apartment. In the meantime he is living at the [employer]. He denies access to weapons and stated he feels better today and is able to contract for safety.   Patient is agreeable to discharge back to the [facility] on 11/15 if the treatment team is agreeable.   Principal Problem: MDD (major depressive disorder), recurrent episode, severe ([place]) Diagnosis: Principal Problem:   MDD (major depressive disorder), recurrent episode, severe ([place]) Active Problems:   COPD (chronic obstructive pulmonary disease) (HCC)   Suicidal ideation   Tobacco abuse   Coronary artery disease   Vitamin D deficiency   Cocaine use disorder ([place])  Total Time spent with patient: 35 minutes  Past Psychiatric History: see H&P  Past Medical History:  Past Medical History:  Diagnosis Date  . Asthma   . CAD (coronary artery disease)   . Cataract   . CKD (chronic kidney disease)   . COPD (chronic obstructive pulmonary disease) ([place])   . Depression   . Hypertension   . NSTEMI (non-ST elevated myocardial infarction) [facility])     Past Surgical History:   Procedure Laterality Date  . APPENDECTOMY    . EYE SURGERY    . LEFT HEART CATH AND CORONARY ANGIOGRAPHY N/A 06/05/2020   Procedure: LEFT HEART CATH AND CORONARY ANGIOGRAPHY;  Surgeon: Nigel Mormon, MD;  Location: [facility];  Service: Cardiovascular;  Laterality: N/A;  . PROSTATE SURGERY     Family History:  Family History  Problem Relation Age of Onset  . Hypertension Mother   . Diabetes Mother   . Hypertension Father   . Hypertension Sister    Family Psychiatric  History: See H&P Social History:  Social History   Substance and Sexual Activity  Alcohol Use Yes   Comment: drinks 2x/wk, 2-3 40 oz drinks, last drink Friday     Social History   Substance and Sexual Activity  Drug Use Yes  . Types: Cocaine   Comment: cocaine use once a month, last use Friday     Social History   Socioeconomic History  . Marital status: Single    Spouse name: Not on file  . Number of children: Not on file  . Years of education: Not on file  . Highest education level: Not on file  Occupational History  . Not on file  Tobacco Use  . Smoking status: Current Every Day Smoker  . Smokeless tobacco: Never Used  . Tobacco comment: smokes 3-4 cigarettes/day  Substance and Sexual Activity  . Alcohol use: Yes    Comment: drinks 2x/wk, 2-3 40 oz drinks, last drink Friday  . Drug use:  Yes    Types: Cocaine    Comment: cocaine use once a month, last use Friday   . Sexual activity: Not Currently  Other Topics Concern  . Not on file  Social History Narrative  . Not on file   Social Determinants of Health   Financial Resource Strain:   . Difficulty of Paying Living Expenses: Not on file  Food Insecurity:   . Worried About Charity fundraiser in the Last Year: Not on file  . Ran Out of Food in the Last Year: Not on file  Transportation Needs:   . Lack of Transportation (Medical): Not on file  . Lack of Transportation (Non-Medical): Not on file  Physical Activity:   . Days  of Exercise per Week: Not on file  . Minutes of Exercise per Session: Not on file  Stress:   . Feeling of Stress : Not on file  Social Connections:   . Frequency of Communication with Friends and Family: Not on file  . Frequency of Social Gatherings with Friends and Family: Not on file  . Attends Religious Services: Not on file  . Active Member of Clubs or Organizations: Not on file  . Attends Archivist Meetings: Not on file  . Marital Status: Not on file   Additional Social History:  Currently living with [employer], has about a month remaining. He is open to resources once he is discharged.      Sleep: Fair  Appetite:  Good  Current Medications: Current Facility-Administered Medications  Medication Dose Route Frequency Provider Last Rate Last Admin  . acetaminophen (TYLENOL) tablet 650 mg  650 mg Oral Q6H PRN Connye Burkitt, NP   650 mg at 09/14/20 0951  . albuterol (VENTOLIN HFA) 108 (90 Base) MCG/ACT inhaler 1-2 puff  1-2 puff Inhalation Q6H PRN Connye Burkitt, NP   2 puff at 09/13/20 2107  . alum & mag hydroxide-simeth (MAALOX/MYLANTA) 200-200-20 MG/5ML suspension 30 mL  30 mL Oral Q4H PRN Connye Burkitt, NP      . amLODipine (NORVASC) tablet 10 mg  10 mg Oral Daily Connye Burkitt, NP   10 mg at 09/14/20 4097  . ARIPiprazole (ABILIFY) tablet 10 mg  10 mg Oral QHS Ethelene Hal, NP      . aspirin EC tablet 81 mg  81 mg Oral Daily Connye Burkitt, NP   81 mg at 09/14/20 3532  . atorvastatin (LIPITOR) tablet 40 mg  40 mg Oral Daily Connye Burkitt, NP   40 mg at 09/14/20 9924  . buPROPion (WELLBUTRIN XL) 24 hr tablet 300 mg  300 mg Oral Daily Cristofano, Dorene Ar, MD   300 mg at 09/14/20 0821  . fluticasone furoate-vilanterol (BREO ELLIPTA) 100-25 MCG/INH 1 puff  1 puff Inhalation Daily Connye Burkitt, NP   1 puff at 09/14/20 2683  . folic acid (FOLVITE) tablet 1 mg  1 mg Oral Daily Connye Burkitt, NP   1 mg at 09/14/20 4196  . hydrOXYzine  (ATARAX/VISTARIL) tablet 25 mg  25 mg Oral TID PRN Connye Burkitt, NP   25 mg at 09/13/20 1810  . influenza vaccine adjuvanted (FLUAD) injection 0.5 mL  0.5 mL Intramuscular Tomorrow-1000 Nwoko, Uchenna E, PA      . ipratropium-albuterol (DUONEB) 0.5-2.5 (3) MG/3ML nebulizer solution 3 mL  3 mL Nebulization Q6H PRN Connye Burkitt, NP      . magnesium hydroxide (MILK OF MAGNESIA) suspension 30 mL  30 mL Oral Daily PRN Connye Burkitt, NP   30 mL at 09/14/20 1450  . multivitamin with minerals tablet 1 tablet  1 tablet Oral Daily Connye Burkitt, NP   1 tablet at 09/14/20 9767  . naphazoline-glycerin (CLEAR EYES REDNESS) ophth solution 1-2 drop  1-2 drop Both Eyes QID PRN Lavella Hammock, MD   2 drop at 09/14/20 0953  . nicotine (NICODERM CQ - dosed in mg/24 hours) patch 21 mg  21 mg Transdermal Daily Connye Burkitt, NP      . thiamine tablet 100 mg  100 mg Oral Daily Connye Burkitt, NP   100 mg at 09/14/20 3419  . traZODone (DESYREL) tablet 25 mg  25 mg Oral QHS PRN,MR X 1 Connye Burkitt, NP   25 mg at 09/13/20 2105  . Vitamin D3 (Vitamin D) tablet 1,000 Units  1,000 Units Oral Daily Lavella Hammock, MD   1,000 Units at 09/14/20 [phone]  . white petrolatum (VASELINE) gel             Lab Results:  No results found for this or any previous visit (from the past 48 hour(s)).  Blood Alcohol level:  Lab Results  Component Value Date   ETH <10 09/06/2020   ETH <10 24/07/7352    Metabolic Disorder Labs: Lab Results  Component Value Date   HGBA1C 4.8 09/07/2020   MPG 91.06 09/07/2020   MPG 99.67 09/25/2019   No results found for: PROLACTIN Lab Results  Component Value Date   CHOL 172 09/09/2020   TRIG 76 09/09/2020   HDL 71 09/09/2020   CHOLHDL 2.4 09/09/2020   VLDL 15 09/09/2020   LDLCALC 86 09/09/2020   LDLCALC 114 (H) 09/25/2019    Physical Findings: AIMS: Facial and Oral Movements Muscles of Facial Expression: None, normal Lips and Perioral Area: None, normal Jaw: None,  normal Tongue: None, normal,Extremity Movements Upper (arms, wrists, hands, fingers): None, normal Lower (legs, knees, ankles, toes): None, normal, Trunk Movements Neck, shoulders, hips: None, normal, Overall Severity Severity of abnormal movements (highest score from questions above): None, normal Incapacitation due to abnormal movements: None, normal Patient's awareness of abnormal movements (rate only patient's report): No Awareness, Dental Status Current problems with teeth and/or dentures?: No Does patient usually wear dentures?: No  CIWA:  CIWA-Ar Total: 0 COWS:     Musculoskeletal: Strength & Muscle Tone: within normal limits Gait & Station: normal Patient leans: N/A  Psychiatric Specialty Exam: Physical Exam: Normal appearance  Review of Systems: Psychiatric/Behavioral Health: Negative for hallucinations, sleep disturbance and suicidal ideations  Blood pressure 126/87, pulse 85, temperature 98 F (36.7 C), temperature source Oral, resp. rate 20, height 6\' 4"  (1.93 m), weight 118.8 kg, SpO2 100 %.Body mass index is 31.89 kg/m.  General Appearance: Casual, Fairly Groomed and dresssd in hospital attire  Eye Contact:  Good  Speech:  Clear and Coherent and Normal Rate  Volume:  Normal  Mood:  Euthymic  Affect:  Congruent and Depressed  Thought Process:  Coherent, Goal Directed and Linear  Orientation:  Full (Time, Place, and Person)  Thought Content:  Logical  Suicidal Thoughts:  No  Homicidal Thoughts:  No  Memory:  Immediate;   Good Recent;   Good Remote;   Good  Judgement:  Fair  Insight:  Fair  Psychomotor Activity:  Normal  Concentration:  Concentration: Fair and Attention Span: Fair  Recall:  Good  Fund of Knowledge:  Fair  Language:  Kermit Balo  Akathisia:  No  Handed:  Right  AIMS (if indicated):     Assets:  Communication Skills Housing Resilience Social Support Talents/Skills  ADL's:  Intact  Cognition:  WNL  Sleep:  Number of Hours: 6     Treatment  Plan Summary: Daily contact with patient to assess and evaluate symptoms and progress in treatment, Medication management and Plan medication managment  Problem #1: Major Depressive Disorder, recurrent, severe Patient has been active in group sessions and adherent to medications. He expresses some limited insight to why he's requiring medications. He is tolerating medications well. His affect is appropriate, and he has some mood reactivity and seems to be benefiting from the plan below. His presentation was most consistent with Major Depressive Disorder. He did endorse vague auditory hallucinations, which have been chronic at this point. However, he does not appear to be responding to internal stimuli. -Will Continue Abilify to 10 mg, schedule for nighttime form daily 09/10/20 for mood augmentation and questionable component of psychosis  - Continue Wellbutrin XL to 300 mg daily starting 11/11 for mood  - Atarax 25 mg PO TID PRN for anxiety  - Trazodone 25 mg PO at bedtime for sleep  Problem #2: Polysubstance use disorder Reported use of alcohol, cocaine, and tobacco for several years. He has never had withdrawal in the past and has not shown any symptoms while admitted.  - Folic acid 1 mg daily &thiamine PO 100 mg daily for nutritional support  - Nicoderm patch 21 mg daily for tobacco use - Imodium, MgOH, Zofran each PRN for GI symptoms  Problem #3:Chronic conditions  Continue home medications as below:  - albuterol PRN, Breo-Ellipta for COPD; prednisone discontinued  - amlodipine 10 mg PO daily for hypertension  - aspirin 81 mg daily, Lipitor 40 mg daily for CAD -Vitamin D 1000 units daily for hypovitaminosis D  Ethelene Hal, NP 09/14/2020, 5:49 PM  I have reviewed the note by NP, and discussed the plan of care.  I am in agreement with the assessment and plan.  Lavella Hammock, MD   Ethelene Hal, NP 09/14/2020, 5:49 PM

## 2020-09-14 NOTE — Progress Notes (Signed)
[place] NOVEL CORONAVIRUS (COVID-19) DAILY CHECK-OFF SYMPTOMS - answer yes or no to each - every day NO YES  Have you had a fever in the past 24 hours?  . Fever (Temp > 37.80C / 100F) X   Have you had any of these symptoms in the past 24 hours? . New Cough .  Sore Throat  .  Shortness of Breath .  Difficulty Breathing .  Unexplained Body Aches   X   Have you had any one of these symptoms in the past 24 hours not related to allergies?   . Runny Nose .  Nasal Congestion .  Sneezing   X   If you have had runny nose, nasal congestion, sneezing in the past 24 hours, has it worsened?  X   EXPOSURES - check yes or no X   Have you traveled outside the state in the past 14 days?  X   Have you been in contact with someone with a confirmed diagnosis of COVID-19 or PUI in the past 14 days without wearing appropriate PPE?  X   Have you been living in the same home as a person with confirmed diagnosis of COVID-19 or a PUI (household contact)?    X   Have you been diagnosed with COVID-19?    X              What to do next: Answered NO to all: Answered YES to anything:   Proceed with unit schedule Follow the [facility] Inpatient Flowsheet.   

## 2020-09-14 NOTE — [facility] Group Notes (Signed)
[place] LCSW Group Therapy Note  09/14/2020    Type of Therapy and Topic:  Group Therapy:  A Hero Worthy of Support  Participation Level:  Active   Description of Group:  Patients in this group were introduced to the concept that additional supports including self-support are an essential part of recovery.  Matching needs with supports to help fulfill those needs was explained.  Establishing boundaries that can gradually be increased or decreased was described, with patients giving their own examples of establishing appropriate boundaries in their lives.  A song entitled "My Own Hero" was played and a group discussion ensued in which patients stated it inspired them to help themselves in order to succeed, because other people cannot achieve their goals such as sobriety or stability for them.  A song was played called "I Am Enough" which led to a discussion about being willing to believe we are worth the effort of being a self-support.   Therapeutic Goals: 1)  demonstrate the importance of being a key part of one's own support system 2)  discuss various available supports 3)  encourage patient to use music as part of their self-support and focus on goals 4)  elicit ideas from patients about supports that need to be added   Summary of Patient Progress:  The patient expressed that his family is available to support him when needed, but he tries to do things on his own whenever possible and avoids calling on his family if at all possible.  The friends he hangs around with are unhealthy supports, he stated.  When the discussion turned to the fact that we actually do need the support of others, he did not respond or acknowledge that this was contrary to his earlier statements.  Therapeutic Modalities:   Motivational Interviewing Activity  Maretta Los

## 2020-09-14 NOTE — Progress Notes (Addendum)
   09/14/20 0900  Psych Admission Type (Psych Patients Only)  Admission Status Voluntary  Psychosocial Assessment  Patient Complaints Anxiety  Eye Contact Brief  Facial Expression Anxious  Affect Appropriate to circumstance  Speech Logical/coherent  Interaction Assertive  Motor Activity Slow  Appearance/Hygiene Unremarkable  Behavior Characteristics Cooperative;Anxious  Mood Depressed;Anxious;Pleasant  Thought Process  Coherency WDL  Content WDL  Delusions None reported or observed  Perception WDL  Hallucination None reported or observed  Judgment Poor  Confusion None  Danger to Self  Current suicidal ideation? Denies  Self-Injurious Behavior No self-injurious ideation or behavior indicators observed or expressed   Agreement Not to Harm Self Yes  Description of Agreement verbally agrees to approach staff immediately for any thoughts of hurting himself or anyone else  Danger to Others  Danger to Others None reported or observed    D. Pt presents as depressed and quiet, exhibiting calm, cooperative, somewhat guarded behavior- observed in the dayroom today attending groups. Per pt's self inventory, pt rated his depression, hopelessness and anxiety all 8's. Pt reports that his most important goal  to work on is 'depression'. Pt currently denies SI/HI and AVH  A. Labs and vitals monitored. Pt administered scheduled medications and given Tylenol for headache, 8/10, this am.  Pt supported emotionally and encouraged to express concerns and ask questions.   R. Pt currently denies headache. Pt remains safe with 15 minute checks. Will continue POC.

## 2020-09-14 NOTE — Progress Notes (Addendum)
Pt reports that his mood is "100% better." He did share that one of his medications is making him drowsy during the day. Pt was informed that his MD has scheduled his Abilify dose at bedtime for this reasoning now. He is looking forward to outpatient treatment upon discharge. Pt denies SI/HI and AVH. Active listening, reassurance, and support provided. Medications administered as ordered by MD. Q 15 min safety checks continue. Pt's safety has been maintained.   09/13/20 2030  Psych Admission Type (Psych Patients Only)  Admission Status Voluntary  Psychosocial Assessment  Patient Complaints Anxiety  Eye Contact Brief  Facial Expression Anxious  Affect Appropriate to circumstance  Speech Logical/coherent  Interaction Assertive  Motor Activity Slow  Appearance/Hygiene Unremarkable  Behavior Characteristics Cooperative;Anxious;Calm  Mood Depressed;Anxious;Pleasant  Thought Process  Coherency WDL  Content WDL  Delusions None reported or observed  Perception WDL  Hallucination None reported or observed  Judgment Poor  Confusion None  Danger to Self  Current suicidal ideation? Denies  Self-Injurious Behavior No self-injurious ideation or behavior indicators observed or expressed   Danger to Others  Danger to Others None reported or observed

## 2020-09-15 MED ORDER — HYDROXYZINE HCL 25 MG PO TABS: 25.0000 mg | ORAL_TABLET | Freq: Three times a day (TID) | ORAL | 0 refills | Status: DC | PRN

## 2020-09-15 MED ORDER — ARIPIPRAZOLE 10 MG PO TABS: 10.0000 mg | ORAL_TABLET | Freq: Every day | ORAL | 0 refills | Status: DC

## 2020-09-15 MED ORDER — FLUTICASONE FUROATE-VILANTEROL 100-25 MCG/INH IN AEPB: 1.0000 | INHALATION_SPRAY | Freq: Every day | RESPIRATORY_TRACT | 0 refills | Status: DC

## 2020-09-15 MED ORDER — TRAZODONE HCL 50 MG PO TABS: 25.0000 mg | ORAL_TABLET | Freq: Every evening | ORAL | 0 refills | Status: DC | PRN

## 2020-09-15 MED ORDER — NICOTINE 21 MG/24HR TD PT24: 21.0000 mg | MEDICATED_PATCH | Freq: Every day | TRANSDERMAL | 0 refills | Status: DC

## 2020-09-15 MED ORDER — BUPROPION HCL ER (XL) 300 MG PO TB24: 300.0000 mg | ORAL_TABLET | Freq: Every day | ORAL | 0 refills | Status: DC

## 2020-09-15 MED ORDER — VITAMIN D3 25 MCG PO TABS: 1000.0000 [IU] | ORAL_TABLET | Freq: Every day | ORAL | 0 refills | Status: DC

## 2020-09-15 NOTE — Progress Notes (Signed)
Recreation Therapy Notes  Date:  1.22.20 Time: 0930 Location: [address] Dayroom  Group Topic: Stress Management  Goal Area(s) Addresses:  Patient will identify positive stress management techniques. Patient will identify benefits of using stress management post d/c.  Behavioral Response: Engaged  Intervention: Stress Management  Activity: Meditation.  LRT played a meditation that focused on making choices.  Patients were to listen and follow as meditation played to engage in activity.    Education:  Stress Management, Discharge Planning.   Education Outcome: Acknowledges Education  Clinical Observations/Feedback: Pt attended and participate in activity.    Victorino Sparrow, LRT/CTRS        Victorino Sparrow A 09/15/2020 11:27 AM
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
## 2020-09-15 NOTE — BHH Group Notes (Signed)
The focus of this group is to help patients establish daily goals to achieve during treatment and discuss how the patient can incorporate goal setting into their daily lives to aide in recovery.  Pt said that he was going home and did not give a goal

## 2020-09-15 NOTE — Progress Notes (Signed)
Adult Psychoeducational Group Note  Date:  09/15/2020 Time:  3:10 AM  Group Topic/Focus:  Wrap-Up Group:   The focus of this group is to help patients review their daily goal of treatment and discuss progress on daily workbooks.  Participation Level:  Active  Participation Quality:  Appropriate  Affect:  Appropriate  Cognitive:  Appropriate  Insight: Appropriate  Engagement in Group:  Engaged  Modes of Intervention:  Discussion  Additional Comments:  Pt attend wrap up group. His day was 7. The one positive thing that happened to him today he goes home tomorrow.  Lenice Llamas Long 09/15/2020, 3:10 AM

## 2020-09-15 NOTE — BHH Suicide Risk Assessment (Signed)
[facility] [place] Discharge Suicide Risk Assessment   Principal Problem: MDD (major depressive disorder), recurrent episode, severe ([place]) Discharge Diagnoses: Principal Problem:   MDD (major depressive disorder), recurrent episode, severe ([place]) Active Problems:   COPD (chronic obstructive pulmonary disease) ([place])   Suicidal ideation   Tobacco abuse   Coronary artery disease   Vitamin D deficiency   Cocaine use disorder ([place])   Total Time spent with patient: 20 minutes  Musculoskeletal: Strength & Muscle Tone: within normal limits Gait & Station: normal Patient leans: N/A  Psychiatric Specialty Exam: Review of Systems  Blood pressure 109/80, pulse 84, temperature 99 F (37.2 C), temperature source Oral, resp. rate 20, height 6\' 4"  (1.93 m), weight 118.8 kg, SpO2 100 %.Body mass index is 31.89 kg/m.  General Appearance: Casual  Eye Contact::  Fair  Speech:  Clear and [id number]  Volume:  Normal  Mood:  Euthymic  Affect:  Appropriate  Thought Process:  Coherent  Orientation:  Full (Time, Place, and Person)  Thought Content:  Logical  Suicidal Thoughts:  No  Homicidal Thoughts:  No  Memory:  Remote;   Fair  Judgement:  Fair  Insight:  Fair  Psychomotor Activity:  Normal  Concentration:  Fair  Recall:  [employer] of Knowledge:Fair  Language: Fair  Akathisia:  No  Handed:  Right  AIMS (if indicated):     Assets:  Communication Skills Desire for Improvement Resilience  Sleep:  Number of Hours: 6  Cognition: WNL  ADL's:  Intact   Mental Status Per Nursing Assessment::   On Admission:  Suicidal ideation indicated by patient  Demographic Factors:  Age 66 or older and Low socioeconomic status  Loss Factors: Financial problems/change in socioeconomic status  Historical Factors: Impulsivity  Risk Reduction Factors:   Religious beliefs about death, Positive social support, Positive therapeutic relationship and Positive coping skills or problem solving skills  Continued  Clinical Symptoms:  Previous Psychiatric Diagnoses and Treatments  Cognitive Features That Contribute To Risk:  None    Suicide Risk:  Minimal: No identifiable suicidal ideation.  Patients presenting with no risk factors but with morbid ruminations; may be classified as minimal risk based on the severity of the depressive symptoms   Follow-up [place]. Go on 09/22/2020.   Specialty: Behavioral Health Why: You have a walk in appointment on 09/22/20 at 8:00 am for therapy services.  You also have an appointment on 10/07/20 at 11:30 am for medication management services.  These appointments will be held in person.  Contact information: [place] [phone]              Plan Of Care/Follow-up recommendations:  Other:  Follow-up with outpatient care  Dixie Dials, MD 09/15/2020, 9:28 AM

## 2020-09-15 NOTE — Progress Notes (Signed)
Pt discharged to lobby. Pt was stable and appreciative at that time. All papers and prescriptions were given and valuables returned. Verbal understanding expressed. Denies SI/HI and A/VH. Pt given opportunity to express concerns and ask questions.  

## 2020-09-15 NOTE — Progress Notes (Signed)
  [facility] Adult Case Management Discharge Plan :  Will you be returning to the same living situation after discharge:  Yes,  [facility]  At discharge, do you have transportation home?: Yes,  safe transport Do you have the ability to pay for your medications: Yes,  has insurance  Release of information consent forms completed and in the chart;  Patient's signature needed at discharge.  Patient to Follow up at:  [place]. Go on 09/22/2020.   Specialty: Behavioral Health Why: You have a walk in appointment on 09/22/20 at 8:00 am for therapy services.  You also have an appointment on 10/07/20 at 11:30 am for medication management services.  These appointments will be held in person.  Contact information: [place] [phone]              Next level of care provider has access to [place] and Suicide Prevention discussed: Yes,  with pt     Has patient been referred to the Quitline?: Patient refused referral  Patient has been referred for addiction treatment: Yes  Mliss Fritz, [place] 09/15/2020, 9:57 AM

## 2020-09-15 NOTE — Tx Team (Signed)
Interdisciplinary Treatment and Diagnostic Plan Update  09/15/2020 Time of Session: 9:05am Robert Chan MRN: [id number]  Principal Diagnosis: MDD (major depressive disorder), recurrent episode, severe ([place])  Secondary Diagnoses: Principal Problem:   MDD (major depressive disorder), recurrent episode, severe ([place]) Active Problems:   COPD (chronic obstructive pulmonary disease) ([place])   Suicidal ideation   Tobacco abuse   Coronary artery disease   Vitamin D deficiency   Cocaine use disorder ([place])   Current Medications:  Current Facility-Administered Medications  Medication Dose Route Frequency Provider Last Rate Last Admin  . acetaminophen (TYLENOL) tablet 650 mg  650 mg Oral Q6H PRN Connye Burkitt, NP   650 mg at 09/14/20 2057  . albuterol (VENTOLIN HFA) 108 (90 Base) MCG/ACT inhaler 1-2 puff  1-2 puff Inhalation Q6H PRN Connye Burkitt, NP   2 puff at 09/13/20 2107  . alum & mag hydroxide-simeth (MAALOX/MYLANTA) 200-200-20 MG/5ML suspension 30 mL  30 mL Oral Q4H PRN Connye Burkitt, NP      . amLODipine (NORVASC) tablet 10 mg  10 mg Oral Daily Connye Burkitt, NP   10 mg at 09/15/20 6294  . ARIPiprazole (ABILIFY) tablet 10 mg  10 mg Oral QHS Ethelene Hal, NP   10 mg at 09/14/20 2057  . aspirin EC tablet 81 mg  81 mg Oral Daily Connye Burkitt, NP   81 mg at 09/15/20 7654  . atorvastatin (LIPITOR) tablet 40 mg  40 mg Oral Daily Connye Burkitt, NP   40 mg at 09/15/20 6503  . buPROPion (WELLBUTRIN XL) 24 hr tablet 300 mg  300 mg Oral Daily Cristofano, Dorene Ar, MD   300 mg at 09/15/20 0808  . fluticasone furoate-vilanterol (BREO ELLIPTA) 100-25 MCG/INH 1 puff  1 puff Inhalation Daily Connye Burkitt, NP   1 puff at 09/15/20 0804  . folic acid (FOLVITE) tablet 1 mg  1 mg Oral Daily Connye Burkitt, NP   1 mg at 09/15/20 5465  . hydrOXYzine (ATARAX/VISTARIL) tablet 25 mg  25 mg Oral TID PRN Connye Burkitt, NP   25 mg at 09/13/20 1810  . influenza vaccine adjuvanted (FLUAD) injection  0.5 mL  0.5 mL Intramuscular Tomorrow-1000 Nwoko, Uchenna E, PA      . ipratropium-albuterol (DUONEB) 0.5-2.5 (3) MG/3ML nebulizer solution 3 mL  3 mL Nebulization Q6H PRN Connye Burkitt, NP      . magnesium hydroxide (MILK OF MAGNESIA) suspension 30 mL  30 mL Oral Daily PRN Connye Burkitt, NP   30 mL at 09/14/20 1450  . multivitamin with minerals tablet 1 tablet  1 tablet Oral Daily Connye Burkitt, NP   1 tablet at 09/15/20 6812  . naphazoline-glycerin (CLEAR EYES REDNESS) ophth solution 1-2 drop  1-2 drop Both Eyes QID PRN Lavella Hammock, MD   2 drop at 09/15/20 0805  . nicotine (NICODERM CQ - dosed in mg/24 hours) patch 21 mg  21 mg Transdermal Daily Connye Burkitt, NP      . thiamine tablet 100 mg  100 mg Oral Daily Connye Burkitt, NP   100 mg at 09/15/20 0807  . traZODone (DESYREL) tablet 25 mg  25 mg Oral QHS PRN,MR X 1 Connye Burkitt, NP   25 mg at 09/14/20 2057  . Vitamin D3 (Vitamin D) tablet 1,000 Units  1,000 Units Oral Daily Lavella Hammock, MD   1,000 Units at 09/15/20 0808   PTA Medications: Medications Prior to Admission  Medication Sig Dispense Refill Last Dose  . albuterol (VENTOLIN HFA) 108 (90 Base) MCG/ACT inhaler Inhale 1-2 puffs into the lungs every 6 (six) hours as needed for wheezing or shortness of breath. 90 g 2   . amLODipine (NORVASC) 10 MG tablet Take 1 tablet (10 mg total) by mouth daily. 30 tablet 0   . ARIPiprazole (ABILIFY) 5 MG tablet Take 1 tablet (5 mg total) by mouth daily. 30 tablet 0   . aspirin EC 81 MG tablet Take 1 tablet (81 mg total) by mouth daily. Swallow whole. 30 tablet 11   . atorvastatin (LIPITOR) 40 MG tablet Take 1 tablet (40 mg total) by mouth daily. 30 tablet 0   . buPROPion (WELLBUTRIN XL) 150 MG 24 hr tablet Take 1 tablet (150 mg total) by mouth daily. 90 tablet 2   . folic acid (FOLVITE) 1 MG tablet Take 1 tablet (1 mg total) by mouth daily. 30 tablet 1   . mirtazapine (REMERON) 15 MG tablet Take 1 tablet (15 mg total) by mouth at  bedtime. 30 tablet 2   . predniSONE (DELTASONE) 20 MG tablet Take 2 tablets (40 mg total) by mouth daily with breakfast. 4 tablet 0   . SYMBICORT 160-4.5 MCG/ACT inhaler Inhale 2 puffs into the lungs in the morning and at bedtime. 10.2 g 11   . thiamine 100 MG tablet Take 1 tablet (100 mg total) by mouth daily. 30 tablet 1   . Vitamin D, Ergocalciferol, (DRISDOL) 1.25 MG (50000 UNIT) CAPS capsule Take 1 capsule (50,000 Units total) by mouth every 7 (seven) days. 5 capsule 5     Patient Stressors: Health problems Loss of several close family members due to covid Medication change or noncompliance Substance abuse  Patient Strengths: Capable of independent living Agricultural engineer for treatment/growth  Treatment Modalities: Medication Management, Group therapy, Case management,  1 to 1 session with clinician, Psychoeducation, Recreational therapy.   Physician Treatment Plan for Primary Diagnosis: MDD (major depressive disorder), recurrent episode, severe ([place]) Long Term Goal(s): Improvement in symptoms so as ready for discharge   Short Term Goals: Ability to identify changes in lifestyle to reduce recurrence of condition will improve Ability to verbalize feelings will improve Ability to disclose and discuss suicidal ideas Ability to demonstrate self-control will improve Ability to identify and develop effective coping behaviors will improve Ability to identify triggers associated with substance abuse/mental health issues will improve  Medication Management: Evaluate patient's response, side effects, and tolerance of medication regimen.  Therapeutic Interventions: 1 to 1 sessions, Unit Group sessions and Medication administration.  Evaluation of Outcomes: Adequate for Discharge  Physician Treatment Plan for Secondary Diagnosis: Principal Problem:   MDD (major depressive disorder), recurrent episode, severe ([place]) Active Problems:   COPD (chronic obstructive pulmonary  disease) ([facility])   Suicidal ideation   Tobacco abuse   Coronary artery disease   Vitamin D deficiency   Cocaine use disorder ([place])  Long Term Goal(s): Improvement in symptoms so as ready for discharge   Short Term Goals: Ability to identify changes in lifestyle to reduce recurrence of condition will improve Ability to verbalize feelings will improve Ability to disclose and discuss suicidal ideas Ability to demonstrate self-control will improve Ability to identify and develop effective coping behaviors will improve Ability to identify triggers associated with substance abuse/mental health issues will improve     Medication Management: Evaluate patient's response, side effects, and tolerance of medication regimen.  Therapeutic Interventions: 1 to 1 sessions, Unit Group sessions  and Medication administration.  Evaluation of Outcomes: Adequate for Discharge   RN Treatment Plan for Primary Diagnosis: MDD (major depressive disorder), recurrent episode, severe ([place]) Long Term Goal(s): Knowledge of disease and therapeutic regimen to maintain health will improve  Short Term Goals: Ability to remain free from injury will improve, Ability to demonstrate self-control, Ability to participate in decision making will improve, Ability to verbalize feelings will improve, Ability to disclose and discuss suicidal ideas and Ability to identify and develop effective coping behaviors will improve  Medication Management: RN will administer medications as ordered by provider, will assess and evaluate patient's response and provide education to patient for prescribed medication. RN will report any adverse and/or side effects to prescribing provider.  Therapeutic Interventions: 1 on 1 counseling sessions, Psychoeducation, Medication administration, Evaluate responses to treatment, Monitor vital signs and CBGs as ordered, Perform/monitor CIWA, COWS, AIMS and Fall Risk screenings as ordered, Perform wound care  treatments as ordered.  Evaluation of Outcomes: Adequate for Discharge   LCSW Treatment Plan for Primary Diagnosis: MDD (major depressive disorder), recurrent episode, severe ([place]) Long Term Goal(s): Safe transition to appropriate next level of care at discharge, Engage patient in therapeutic group addressing interpersonal concerns.  Short Term Goals: Engage patient in aftercare planning with referrals and resources, Increase social support, Increase emotional regulation, Facilitate acceptance of mental health diagnosis and concerns, Identify triggers associated with mental health/substance abuse issues and Increase skills for wellness and recovery  Therapeutic Interventions: Assess for all discharge needs, 1 to 1 time with Social worker, Explore available resources and support systems, Assess for adequacy in community support network, Educate family and significant other(s) on suicide prevention, Complete Psychosocial Assessment, Interpersonal group therapy.  Evaluation of Outcomes: Adequate for Discharge   Progress in Treatment: Attending groups: Yes. Participating in groups: Yes. Taking medication as prescribed: Yes. Toleration medication: Yes. Family/Significant other contact made: No, will contact:  consents not provided Patient understands diagnosis: Yes. and No. Discussing patient identified problems/goals with staff: Yes. Medical problems stabilized or resolved: Yes. Denies suicidal/homicidal ideation: Yes. Issues/concerns per patient self-inventory: No.   New problem(s) identified: No, Describe:  None   New Short Term/Long Term Goal(s): medication stabilization, elimination of SI thoughts, development of comprehensive mental wellness plan.   Patient Goals: "To get back on the right medications"   Discharge Plan or Barriers: Patient will return home and will follow up with [facility] for medication management and therapy.  Reason for Continuation of Hospitalization: Medication  stabilization  Estimated Length of Stay: Adequate for discharge    Attendees: Patient: Robert Chan  09/15/2020   Physician: Lala Lund, MD 09/15/2020   Nursing:  09/15/2020   RN Care Manager: 09/15/2020   Social Worker: Verdis Frederickson, [place] 09/15/2020   Recreational Therapist:  09/15/2020   Other:  09/15/2020   Other:  09/15/2020   Other: 09/15/2020      Scribe for Treatment Team: Darleen Crocker, [place] 09/15/2020 9:16 AM

## 2020-09-16 ENCOUNTER — Telehealth: Payer: Self-pay

## 2020-09-16 NOTE — Telephone Encounter (Signed)
Attempted to contact patient # [phone] to inform him that as per Lynnae Prude he needs to go to [facility] appointment on 09/19/2020 @ 1200 instead of 09/22/2020. He then needs to come to [facility] for appointment with Dr Joya Gaskins 09/22/2020 @ 0930. Message left with call back requested to this CM.

## 2020-09-16 NOTE — Telephone Encounter (Signed)
Pt called back and given the information as noted in chart. Went over several times with the pt. Pt verbalized understanding.

## 2020-09-17 ENCOUNTER — Telehealth: Payer: Self-pay | Admitting: Critical Care Medicine

## 2020-09-17 DIAGNOSIS — J410 Simple chronic bronchitis: Secondary | ICD-10-CM

## 2020-09-17 MED ORDER — ALBUTEROL SULFATE (2.5 MG/3ML) 0.083% IN NEBU: 2.5000 mg | INHALATION_SOLUTION | Freq: Four times a day (QID) | RESPIRATORY_TRACT | 1 refills | Status: DC | PRN

## 2020-09-17 NOTE — Telephone Encounter (Signed)
Wells Guiles with [facility] is calling and the patient needs a nebulizer machine and possibly the nebulizer solution. Pt was discharge from [facility] on 09-15-2020 dx   sob and chest pain. Pt was referral to behavior health. [facility] is [facility] [address]. Pt has an appt on 09-22-2020

## 2020-09-17 NOTE — Telephone Encounter (Signed)
Call placed to patient to remind him of upcoming appts: behavioral health 09/19/2020 @ 1200 and [facility] 09/22/2020 @ 0930. Provided him with the address for the Behavioral health appointment.  He correctly repeated the address and stated the correct date/times of each appointment.

## 2020-09-17 NOTE — Telephone Encounter (Signed)
Nebulizer order placed.  [facility]   [facility] ,  Order printed,  Need a provider to sign I am out of office til [facility]

## 2020-09-17 NOTE — Telephone Encounter (Signed)
Please see below message. Thank you 

## 2020-09-21 NOTE — Progress Notes (Deleted)
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 fluticasone furoate-vilanterol (BREO ELLIPTA) 100-25 MCG/INH AEPB Inhale 1 puff into the lungs daily. 1 each 0  . folic acid (FOLVITE) 1 MG tablet Take 1 tablet (1 mg total) by mouth daily. 30 tablet 1  . hydrOXYzine (ATARAX/VISTARIL) 25 MG tablet Take 1 tablet (25 mg total) by mouth 3 (three) times daily as needed for anxiety. 30 tablet 0  . nicotine (NICODERM CQ - DOSED IN MG/24 HOURS) 21 mg/24hr patch Place 1 patch (21 mg total) onto the skin daily. 28 patch 0  . SYMBICORT 160-4.5 MCG/ACT inhaler Inhale 2 puffs into the lungs in the morning and at bedtime. 10.2 g 11  . thiamine 100 MG tablet Take 1 tablet (100 mg total) by mouth daily. 30 tablet 1  .  traZODone (DESYREL) 50 MG tablet Take 0.5 tablets (25 mg total) by mouth at bedtime as needed and may repeat dose one time if needed for sleep. 60 tablet 0  . Vitamin D, Ergocalciferol, (DRISDOL) 1.25 MG (50000 UNIT) CAPS capsule Take 1 capsule (50,000 Units total) by mouth every 7 (seven) days. 5 capsule 5  . Vitamin D3 (VITAMIN D) 25 MCG tablet Take 1 tablet (1,000 Units total) by mouth daily. 60 tablet 0   No facility-administered medications prior to visit.    Review of Systems Constitutional:   No  weight loss, night sweats,  Fevers, chills, fatigue, lassitude. HEENT:   No headaches,  Difficulty swallowing,  Tooth/dental problems,  Sore throat,                 sneezing, itching, ear ache, nasal congestion, post nasal drip,   CV:  No chest pain,  Orthopnea, PND, swelling in lower extremities, anasarca, dizziness, palpitations  GI   heartburn, indigestion, abdominal pain, nausea, vomiting, diarrhea, change in bowel habits, loss of appetite  Resp:  shortness of breath with exertion or at rest.  No excess mucus, no productive cough,  No non-productive cough,  No coughing up of blood.  No change in color of mucus.  No wheezing.  No chest wall deformity  Skin: no rash or lesions.  GU: no dysuria, change in color of urine, no urgency or frequency.  No flank pain.  MS:  No joint pain or swelling.  No decreased range of motion.  No back pain.  Psych:  No change in mood or affect. No depression or anxiety.  No memory loss.     Objective:   Physical Exam There were no vitals filed for this visit.  Gen: Pleasant, well-nourished, in no distress,  normal affect  ENT: No lesions,  mouth clear,  oropharynx clear, no postnasal drip  Neck: No JVD, no TMG, no carotid bruits  Lungs: No use of accessory muscles, no dullness to percussion, clear without rales or rhonchi  Cardiovascular: RRR, heart sounds normal, no murmur or gallops, no peripheral edema  Abdomen: soft and NT, no HSM,  BS  normal  Musculoskeletal: No deformities, no cyanosis or clubbing  Neuro: alert, non focal  Skin: Warm, no lesions or rashes  On the left foot there is a severe callus forming at the base of the great toe  No results found.  Diagnostic Dominance: Right Left Main  Vessel is  normal in caliber.  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  Left Circumflex  Vessel is normal in caliber. Vessel is angiographically normal.  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       Assessment & Plan:  I personally reviewed all images and lab data in the [facility] system as well as any outside material available during this office visit and agree with the  radiology impressions.   No problem-specific Assessment & Plan notes found for this encounter.   There are no diagnoses linked to this encounter.Will check B12 levels

## 2020-09-22 ENCOUNTER — Ambulatory Visit: Payer: Medicare HMO | Admitting: Critical Care Medicine
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
## 2020-09-29 ENCOUNTER — Ambulatory Visit: Payer: Medicare HMO | Attending: Critical Care Medicine | Admitting: Critical Care Medicine

## 2020-09-29 ENCOUNTER — Other Ambulatory Visit: Payer: Self-pay

## 2020-09-29 ENCOUNTER — Telehealth (INDEPENDENT_AMBULATORY_CARE_PROVIDER_SITE_OTHER): Payer: Self-pay | Admitting: Licensed Clinical Social Worker

## 2020-09-29 ENCOUNTER — Encounter: Payer: Self-pay | Admitting: Critical Care Medicine

## 2020-09-29 VITALS — BP 107/73 | HR 75 | Wt 280.0 lb

## 2020-09-29 DIAGNOSIS — F1410 Cocaine abuse, uncomplicated: Secondary | ICD-10-CM | POA: Diagnosis not present

## 2020-09-29 DIAGNOSIS — Z5900 Homelessness unspecified: Secondary | ICD-10-CM

## 2020-09-29 DIAGNOSIS — F17210 Nicotine dependence, cigarettes, uncomplicated: Secondary | ICD-10-CM | POA: Diagnosis not present

## 2020-09-29 DIAGNOSIS — J410 Simple chronic bronchitis: Secondary | ICD-10-CM | POA: Diagnosis not present

## 2020-09-29 DIAGNOSIS — I10 Essential (primary) hypertension: Secondary | ICD-10-CM | POA: Diagnosis not present

## 2020-09-29 DIAGNOSIS — R45851 Suicidal ideations: Secondary | ICD-10-CM

## 2020-09-29 DIAGNOSIS — F332 Major depressive disorder, recurrent severe without psychotic features: Secondary | ICD-10-CM | POA: Diagnosis not present

## 2020-09-29 DIAGNOSIS — R7309 Other abnormal glucose: Secondary | ICD-10-CM

## 2020-09-29 DIAGNOSIS — Z720 Tobacco use: Secondary | ICD-10-CM

## 2020-09-29 DIAGNOSIS — R69 Illness, unspecified: Secondary | ICD-10-CM | POA: Diagnosis not present

## 2020-09-29 DIAGNOSIS — F1020 Alcohol dependence, uncomplicated: Secondary | ICD-10-CM

## 2020-09-29 LAB — GLUCOSE, POCT (MANUAL RESULT ENTRY): POC Glucose: 90 mg/dl (ref 70–99)

## 2020-09-29 MED ORDER — HYDROXYZINE HCL 25 MG PO TABS: 25.0000 mg | ORAL_TABLET | Freq: Three times a day (TID) | ORAL | 1 refills | Status: DC | PRN

## 2020-09-29 MED ORDER — ARIPIPRAZOLE 10 MG PO TABS: 10.0000 mg | ORAL_TABLET | Freq: Every day | ORAL | 3 refills | Status: DC

## 2020-09-29 MED ORDER — TRAZODONE HCL 50 MG PO TABS: 25.0000 mg | ORAL_TABLET | Freq: Every evening | ORAL | 0 refills | Status: DC | PRN

## 2020-09-29 MED ORDER — ALBUTEROL SULFATE (2.5 MG/3ML) 0.083% IN NEBU: 2.5000 mg | INHALATION_SOLUTION | Freq: Four times a day (QID) | RESPIRATORY_TRACT | 1 refills | Status: DC | PRN

## 2020-09-29 MED ORDER — THIAMINE HCL 100 MG PO TABS: 100.0000 mg | ORAL_TABLET | Freq: Every day | ORAL | 1 refills | Status: DC

## 2020-09-29 MED ORDER — ATORVASTATIN CALCIUM 40 MG PO TABS: 40.0000 mg | ORAL_TABLET | Freq: Every day | ORAL | 1 refills | Status: DC

## 2020-09-29 MED ORDER — AMLODIPINE BESYLATE 10 MG PO TABS: 10.0000 mg | ORAL_TABLET | Freq: Every day | ORAL | 1 refills | Status: DC

## 2020-09-29 MED ORDER — SYMBICORT 160-4.5 MCG/ACT IN AERO: 2.0000 | INHALATION_SPRAY | Freq: Two times a day (BID) | RESPIRATORY_TRACT | 11 refills | Status: DC

## 2020-09-29 MED ORDER — ASPIRIN EC 81 MG PO TBEC: 81.0000 mg | DELAYED_RELEASE_TABLET | Freq: Every day | ORAL | 11 refills | Status: DC

## 2020-09-29 MED ORDER — NICOTINE 21 MG/24HR TD PT24: 21.0000 mg | MEDICATED_PATCH | Freq: Every day | TRANSDERMAL | 0 refills | Status: DC

## 2020-09-29 NOTE — Assessment & Plan Note (Signed)
Patient remains homeless at this time living at the [facility]

## 2020-09-29 NOTE — Assessment & Plan Note (Signed)
Referral made to psychiatry for substance use

## 2020-09-29 NOTE — Patient Instructions (Signed)
You will need follow-up at the [facility] outpatient clinic we will attempt to connect you with an appointment time  Refills on all your medications sent to the [facility]  Return to see Dr. Joya Gaskins in 1 month  Focus on stopping smoking using nicotine patches sent to the pharmacy   Tobacco Use Disorder Tobacco use disorder (TUD) occurs when a person craves, seeks, and uses tobacco, regardless of the consequences. This disorder can cause problems with mental and physical health. It can affect your ability to have healthy relationships, and it can keep you from meeting your responsibilities at work, home, or school. Tobacco may be:  Smoked as a cigarette or cigar.  Inhaled using e-cigarettes.  Smoked in a pipe or hookah.  Chewed as smokeless tobacco.  Inhaled into the nostrils as snuff. Tobacco products contain a dangerous chemical called nicotine, which is very addictive. Nicotine triggers hormones that make the body feel stimulated and works on areas of the brain that make you feel good. These effects can make it hard for people to quit nicotine. Tobacco contains many other unsafe chemicals that can damage almost every organ in the body. Smoking tobacco also puts others in danger due to fire risk and possible health problems caused by breathing in secondhand smoke. What are the signs or symptoms? Symptoms of TUD may include:  Being unable to slow down or stop your tobacco use.  Spending an abnormal amount of time getting or using tobacco.  Craving tobacco. Cravings may last for up to 6 months after quitting.  Tobacco use that: ? Interferes with your work, school, or home life. ? Interferes with your personal and social relationships. ? Makes you give up activities that you once enjoyed or found important.  Using tobacco even though you know that it is: ? Dangerous or bad for your health or someone else's health. ? Causing problems in your  life.  Needing more and more of the substance to get the same effect (developing tolerance).  Experiencing unpleasant symptoms if you do not use the substance (withdrawal). Withdrawal symptoms may include: ? Depressed, anxious, or irritable mood. ? Difficulty concentrating. ? Increased appetite. ? Restlessness or trouble sleeping.  Using the substance to avoid withdrawal. How is this diagnosed? This condition may be diagnosed based on:  Your current and past tobacco use. Your health care provider may ask questions about how your tobacco use affects your life.  A physical exam. You may be diagnosed with TUD if you have at least two symptoms within a 65-month period. How is this treated? This condition is treated by stopping tobacco use. Many people are unable to quit on their own and need help. Treatment may include:  Nicotine replacement therapy (NRT). NRT provides nicotine without the other harmful chemicals in tobacco. NRT gradually lowers the dosage of nicotine in the body and reduces withdrawal symptoms. NRT is available as: ? Over-the-counter gums, lozenges, and skin patches. ? Prescription mouth inhalers and nasal sprays.  Medicine that acts on the brain to reduce cravings and withdrawal symptoms.  A type of talk therapy that examines your triggers for tobacco use, how to avoid them, and how to cope with cravings (behavioral therapy).  Hypnosis. This may help with withdrawal symptoms.  Joining a support group for others coping with TUD. The best treatment for TUD is usually a combination of medicine, talk therapy, and support groups. Recovery can be a long process. Many people start using tobacco again after stopping (  relapse). If you relapse, it does not mean that treatment will not work. Follow these instructions at home:  Lifestyle  Do not use any products that contain nicotine or tobacco, such as cigarettes and e-cigarettes.  Avoid things that trigger tobacco use as  much as you can. Triggers include people and situations that usually cause you to use tobacco.  Avoid drinks that contain caffeine, including coffee. These may worsen some withdrawal symptoms.  Find ways to manage stress. Wanting to smoke may cause stress, and stress can make you want to smoke. Relaxation techniques such as deep breathing, meditation, and yoga may help.  Attend support groups as needed. These groups are an important part of long-term recovery for many people. General instructions  Take over-the-counter and prescription medicines only as told by your health care provider.  Check with your health care provider before taking any new prescription or over-the-counter medicines.  Decide on a friend, family member, or smoking quit-line (such as 1-800-QUIT-NOW in the [place] that you can call or text when you feel the urge to smoke or when you need help coping with cravings.  Keep all follow-up visits as told by your health care provider and therapist. This is important. Contact a health care provider if:  You are not able to take your medicines as prescribed.  Your symptoms get worse, even with treatment. Summary  Tobacco use disorder (TUD) occurs when a person craves, seeks, and uses tobacco regardless of the consequences.  This condition may be diagnosed based on your current and past tobacco use and a physical exam.  Many people are unable to quit on their own and need help. Recovery can be a long process.  The most effective treatment for TUD is usually a combination of medicine, talk therapy, and support groups. This information is not intended to replace advice given to you by your health care provider. Make sure you discuss any questions you have with your health care provider. Document Revised: 10/05/2017 Document Reviewed: 10/05/2017 Elsevier Patient Education  2020 [employer].

## 2020-09-29 NOTE — Assessment & Plan Note (Signed)
Hypertension under reasonable control  Continue amlodipine daily

## 2020-09-29 NOTE — Progress Notes (Signed)
Needs refill on trazadone Taking Tylenol for back pain-fall at gym

## 2020-09-29 NOTE — Assessment & Plan Note (Signed)
Not currently suicidal at this time

## 2020-09-29 NOTE — Telephone Encounter (Signed)
Follow up call placed to patient with RN CM, Eden Lathe. LCSW left message requesting a return call.

## 2020-09-29 NOTE — Assessment & Plan Note (Signed)
  .   Current smoking consumption amount: 5 cigarettes daily  . Dicsussion on advise to quit smoking and smoking impacts: Cardiovascular lung impacts  . Patient's willingness to quit: Not yet committed  . Methods to quit smoking discussed: Behavioral modification nicotine replacement  . Medication management of smoking session drugs discussed: Nicotine replacement and given a prescription for 21 mg patch at this visit  . Resources provided:  AVS   . Setting quit date not established  . Follow-up arranged 1 month   Time spent counseling the patient: 5 minutes

## 2020-09-29 NOTE — Assessment & Plan Note (Signed)
No longer drinking alcohol but was drinking previously

## 2020-09-29 NOTE — Assessment & Plan Note (Signed)
COPD stable at this time continue albuterol and Symbicort

## 2020-09-29 NOTE — Assessment & Plan Note (Signed)
Severe depression we will continue Abilify hydroxyzine as needed trazodone at bedtime and Wellbutrin 300 mg daily

## 2020-10-01 ENCOUNTER — Telehealth: Payer: Self-pay

## 2020-10-01 NOTE — Telephone Encounter (Signed)
Call placed to patient x 2 with Christa See, LCSW, the message stated that the call is not able to be completed at this time.

## 2020-10-06 ENCOUNTER — Emergency Department (HOSPITAL_COMMUNITY): Payer: Medicare HMO

## 2020-10-06 ENCOUNTER — Observation Stay (HOSPITAL_COMMUNITY)
Admission: EM | Admit: 2020-10-06 | Discharge: 2020-10-07 | Disposition: A | Discharge status: Home / Self Care | Payer: Medicare HMO | Attending: Internal Medicine | Admitting: Internal Medicine

## 2020-10-06 ENCOUNTER — Emergency Department (HOSPITAL_BASED_OUTPATIENT_CLINIC_OR_DEPARTMENT_OTHER)
Admit: 2020-10-06 | Discharge: 2020-10-06 | Disposition: A | Discharge status: Home / Self Care | Payer: Medicare HMO | Attending: Emergency Medicine | Admitting: Emergency Medicine

## 2020-10-06 DIAGNOSIS — J45909 Unspecified asthma, uncomplicated: Secondary | ICD-10-CM | POA: Diagnosis not present

## 2020-10-06 DIAGNOSIS — I2510 Atherosclerotic heart disease of native coronary artery without angina pectoris: Secondary | ICD-10-CM | POA: Insufficient documentation

## 2020-10-06 DIAGNOSIS — R072 Precordial pain: Secondary | ICD-10-CM | POA: Diagnosis not present

## 2020-10-06 DIAGNOSIS — N189 Chronic kidney disease, unspecified: Secondary | ICD-10-CM | POA: Diagnosis not present

## 2020-10-06 DIAGNOSIS — Z79899 Other long term (current) drug therapy: Secondary | ICD-10-CM | POA: Insufficient documentation

## 2020-10-06 DIAGNOSIS — I201 Angina pectoris with documented spasm: Secondary | ICD-10-CM

## 2020-10-06 DIAGNOSIS — I10 Essential (primary) hypertension: Secondary | ICD-10-CM

## 2020-10-06 DIAGNOSIS — I129 Hypertensive chronic kidney disease with stage 1 through stage 4 chronic kidney disease, or unspecified chronic kidney disease: Secondary | ICD-10-CM | POA: Insufficient documentation

## 2020-10-06 DIAGNOSIS — Z9119 Patient's noncompliance with other medical treatment and regimen: Secondary | ICD-10-CM | POA: Diagnosis not present

## 2020-10-06 DIAGNOSIS — J449 Chronic obstructive pulmonary disease, unspecified: Secondary | ICD-10-CM | POA: Insufficient documentation

## 2020-10-06 DIAGNOSIS — Z20822 Contact with and (suspected) exposure to covid-19: Secondary | ICD-10-CM | POA: Insufficient documentation

## 2020-10-06 DIAGNOSIS — R0602 Shortness of breath: Secondary | ICD-10-CM | POA: Diagnosis not present

## 2020-10-06 DIAGNOSIS — F17210 Nicotine dependence, cigarettes, uncomplicated: Secondary | ICD-10-CM | POA: Insufficient documentation

## 2020-10-06 DIAGNOSIS — D631 Anemia in chronic kidney disease: Secondary | ICD-10-CM | POA: Diagnosis not present

## 2020-10-06 DIAGNOSIS — F1421 Cocaine dependence, in remission: Secondary | ICD-10-CM

## 2020-10-06 DIAGNOSIS — I252 Old myocardial infarction: Secondary | ICD-10-CM | POA: Diagnosis not present

## 2020-10-06 DIAGNOSIS — F32A Depression, unspecified: Secondary | ICD-10-CM | POA: Insufficient documentation

## 2020-10-06 DIAGNOSIS — Z7951 Long term (current) use of inhaled steroids: Secondary | ICD-10-CM | POA: Diagnosis not present

## 2020-10-06 DIAGNOSIS — R079 Chest pain, unspecified: Secondary | ICD-10-CM | POA: Diagnosis present

## 2020-10-06 DIAGNOSIS — R69 Illness, unspecified: Secondary | ICD-10-CM | POA: Diagnosis not present

## 2020-10-06 DIAGNOSIS — R0789 Other chest pain: Secondary | ICD-10-CM | POA: Diagnosis present

## 2020-10-06 DIAGNOSIS — F1410 Cocaine abuse, uncomplicated: Secondary | ICD-10-CM

## 2020-10-06 DIAGNOSIS — M79609 Pain in unspecified limb: Secondary | ICD-10-CM | POA: Diagnosis not present

## 2020-10-06 LAB — BASIC METABOLIC PANEL
Anion gap: 11 (ref 5–15)
BUN: 16 mg/dL (ref 8–23)
CO2: 21 mmol/L — ABNORMAL LOW (ref 22–32)
Calcium: 9.2 mg/dL (ref 8.9–10.3)
Chloride: 107 mmol/L (ref 98–111)
Creatinine, Ser: 1.54 mg/dL — ABNORMAL HIGH (ref 0.61–1.24)
GFR, Estimated: 50 mL/min — ABNORMAL LOW (ref >60)
Glucose, Bld: 125 mg/dL — ABNORMAL HIGH (ref 70–99)
Potassium: 4 mmol/L (ref 3.5–5.1)
Sodium: 139 mmol/L (ref 135–145)

## 2020-10-06 LAB — CBC
HCT: 39.9 % (ref 39.0–52.0)
Hemoglobin: 12.5 g/dL — ABNORMAL LOW (ref 13.0–17.0)
MCH: 29.6 pg (ref 26.0–34.0)
MCHC: 31.3 g/dL (ref 30.0–36.0)
MCV: 94.3 fL (ref 80.0–100.0)
Platelets: 261 10*3/uL (ref 150–400)
RBC: 4.23 MIL/uL (ref 4.22–5.81)
RDW: 12.1 % (ref 11.5–15.5)
WBC: 7.9 10*3/uL (ref 4.0–10.5)
nRBC: 0 % (ref 0.0–0.2)

## 2020-10-06 LAB — TROPONIN I (HIGH SENSITIVITY)
Troponin I (High Sensitivity): 8 ng/L (ref <18)
Troponin I (High Sensitivity): 6 ng/L (ref <18)

## 2020-10-06 MED ORDER — SODIUM CHLORIDE 0.9 % IV SOLN: Freq: Once | INTRAVENOUS | Status: AC

## 2020-10-06 NOTE — Progress Notes (Signed)
Left lower extremity venous duplex has been completed. Preliminary results can be found in [facility] through chart review.  Results were given to Dr. Sherry Ruffing.  10/06/20 7:06 PM Robert Chan RVT

## 2020-10-06 NOTE — ED Triage Notes (Signed)
Pt here from home with c/o cheat pain . Back pain and left leg pain ,

## 2020-10-06 NOTE — ED Provider Notes (Signed)
[facility] EMERGENCY DEPARTMENT Provider Note   CSN: [id number] Arrival date & time: 10/06/20  1601     History No chief complaint on file.   Robert Chan is a 64 y.o. male.  The history is provided by the patient and medical records. No language interpreter was used.  Chest Pain Pain location:  Substernal area and L chest Pain quality: crushing and pressure   Pain radiates to:  Does not radiate Pain severity:  No pain Onset quality:  Gradual Timing:  Constant Progression:  Worsening Chronicity:  New Relieved by:  Nothing Worsened by:  Nothing Ineffective treatments:  None tried Associated symptoms: diaphoresis, nausea and shortness of breath   Associated symptoms: no abdominal pain, no anxiety, no back pain, no cough, no dizziness, no fatigue, no fever, no headache, no lower extremity edema, no palpitations, no vomiting and no weakness        Past Medical History:  Diagnosis Date  . Asthma   . CAD (coronary artery disease)   . Cataract   . CKD (chronic kidney disease)   . COPD (chronic obstructive pulmonary disease) ([place])   . Depression   . Hypertension   . NSTEMI (non-ST elevated myocardial infarction) ([place])   . Suicidal ideation 06/05/2020    Patient Active Problem List   Diagnosis Date Noted  . MDD (major depressive disorder), recurrent episode, severe ([place]) 09/08/2020  . Cocaine use disorder ([place])   . Vitamin D deficiency 06/23/2020  . Coronary artery disease 06/19/2020  . Homelessness 06/19/2020  . Tobacco abuse 06/05/2020  . History of non-ST elevation myocardial infarction (NSTEMI)   . Foot callus 12/12/2019  . Cocaine use disorder, mild, abuse ([place])   . Elevated serum creatinine 08/28/2019  . Moderate alcohol use disorder ([place]) 04/01/2018  . Age-related nuclear cataract of right eye 09/13/2016  . Benign essential hypertension 07/06/2016  . Glaucoma 07/06/2016  . Male erectile disorder 07/06/2016  . Asthma 10/18/2012  . COPD  (chronic obstructive pulmonary disease) ([place]) 10/18/2012    Past Surgical History:  Procedure Laterality Date  . APPENDECTOMY    . EYE SURGERY    . LEFT HEART CATH AND CORONARY ANGIOGRAPHY N/A 06/05/2020   Procedure: LEFT HEART CATH AND CORONARY ANGIOGRAPHY;  Surgeon: Nigel Mormon, MD;  Location: [facility];  Service: Cardiovascular;  Laterality: N/A;  . PROSTATE SURGERY         Family History  Problem Relation Age of Onset  . Hypertension Mother   . Diabetes Mother   . Hypertension Father   . Hypertension Sister     Social History   Tobacco Use  . Smoking status: Current Every Day Smoker  . Smokeless tobacco: Never Used  . Tobacco comment: smokes 3-4 cigarettes/day  Substance Use Topics  . Alcohol use: Yes    Comment: drinks 2x/wk, 2-3 40 oz drinks, last drink Friday  . Drug use: Yes    Types: Cocaine    Comment: cocaine use once a month, last use Friday     Home Medications Prior to Admission medications   Medication Sig Start Date End Date Taking? Authorizing Provider  albuterol (PROVENTIL) (2.5 MG/3ML) 0.083% nebulizer solution Take 3 mLs (2.5 mg total) by nebulization every 6 (six) hours as needed for wheezing or shortness of breath. 09/29/20   Elsie Stain, MD  albuterol (VENTOLIN HFA) 108 (90 Base) MCG/ACT inhaler Inhale 1-2 puffs into the lungs every 6 (six) hours as needed for wheezing or shortness of breath.  06/19/20   Elsie Stain, MD  amLODipine (NORVASC) 10 MG tablet Take 1 tablet (10 mg total) by mouth daily. 09/29/20   Elsie Stain, MD  ARIPiprazole (ABILIFY) 10 MG tablet Take 1 tablet (10 mg total) by mouth at bedtime. 09/29/20   Elsie Stain, MD  aspirin EC 81 MG tablet Take 1 tablet (81 mg total) by mouth daily. Swallow whole. 09/29/20   Elsie Stain, MD  atorvastatin (LIPITOR) 40 MG tablet Take 1 tablet (40 mg total) by mouth daily. 09/29/20   Elsie Stain, MD  buPROPion (WELLBUTRIN XL) 300 MG 24 hr tablet Take  1 tablet (300 mg total) by mouth daily. 09/16/20   Niel Hummer, NP  folic acid (FOLVITE) 1 MG tablet Take 1 tablet (1 mg total) by mouth daily. 06/19/20   Elsie Stain, MD  hydrOXYzine (ATARAX/VISTARIL) 25 MG tablet Take 1 tablet (25 mg total) by mouth 3 (three) times daily as needed for anxiety. 09/29/20   Elsie Stain, MD  nicotine (NICODERM CQ - DOSED IN MG/24 HOURS) 21 mg/24hr patch Place 1 patch (21 mg total) onto the skin daily. 09/29/20   Elsie Stain, MD  SYMBICORT 160-4.5 MCG/ACT inhaler Inhale 2 puffs into the lungs in the morning and at bedtime. 09/29/20   Elsie Stain, MD  thiamine 100 MG tablet Take 1 tablet (100 mg total) by mouth daily. 09/29/20   Elsie Stain, MD  traZODone (DESYREL) 50 MG tablet Take 0.5 tablets (25 mg total) by mouth at bedtime as needed and may repeat dose one time if needed for sleep. 09/29/20   Elsie Stain, MD  Vitamin D3 (VITAMIN D) 25 MCG tablet Take 1 tablet (1,000 Units total) by mouth daily. 09/16/20   Niel Hummer, NP    Allergies    Shellfish allergy  Review of Systems   Review of Systems  Constitutional: Positive for diaphoresis. Negative for chills, fatigue and fever.  HENT: Negative for congestion.   Respiratory: Positive for chest tightness and shortness of breath. Negative for cough and wheezing.   Cardiovascular: Positive for chest pain. Negative for palpitations and leg swelling.  Gastrointestinal: Positive for nausea. Negative for abdominal pain, constipation, diarrhea and vomiting.  Genitourinary: Negative for dysuria and frequency.  Musculoskeletal: Negative for back pain.  Neurological: Positive for light-headedness. Negative for dizziness, weakness and headaches.  Psychiatric/Behavioral: Negative for agitation and confusion.  All other systems reviewed and are negative.   Physical Exam Updated Vital Signs BP (!) 131/97 (BP Location: Right Arm)   Pulse 73   Temp 97.7 F (36.5 C) (Oral)   Resp  16   SpO2 97%   Physical Exam Vitals and nursing note reviewed.  Constitutional:      General: He is not in acute distress.    Appearance: He is well-developed. He is not ill-appearing, toxic-appearing or diaphoretic.  HENT:     Head: Normocephalic and atraumatic.     Mouth/Throat:     Mouth: Mucous membranes are moist.     Pharynx: No oropharyngeal exudate or posterior oropharyngeal erythema.  Eyes:     Extraocular Movements: Extraocular movements intact.     Conjunctiva/sclera: Conjunctivae normal.     Pupils: Pupils are equal, round, and reactive to light.  Cardiovascular:     Rate and Rhythm: Normal rate and regular rhythm.     Pulses: Normal pulses.     Heart sounds: No murmur heard.   Pulmonary:  Effort: Pulmonary effort is normal. No respiratory distress.     Breath sounds: Normal breath sounds. No wheezing, rhonchi or rales.  Chest:     Chest wall: No tenderness.  Abdominal:     General: Abdomen is flat.     Palpations: Abdomen is soft.     Tenderness: There is no abdominal tenderness. There is no right CVA tenderness, left CVA tenderness, guarding or rebound.  Musculoskeletal:        General: No tenderness.     Cervical back: Neck supple. No tenderness.     Right lower leg: No edema.     Left lower leg: No edema.  Skin:    General: Skin is warm and dry.     Capillary Refill: Capillary refill takes less than 2 seconds.     Findings: No erythema.  Neurological:     General: No focal deficit present.     Mental Status: He is alert.     Sensory: No sensory deficit.     Motor: No weakness.  Psychiatric:        Mood and Affect: Mood normal.     ED Results / Procedures / Treatments   Labs (all labs ordered are listed, but only abnormal results are displayed) Labs Reviewed  BASIC METABOLIC PANEL - Abnormal; Notable for the following components:      Result Value   CO2 21 (*)    Glucose, Bld 125 (*)    Creatinine, Ser 1.54 (*)    GFR, Estimated 50 (*)     All other components within normal limits  CBC - Abnormal; Notable for the following components:   Hemoglobin 12.5 (*)    All other components within normal limits  D-DIMER, QUANTITATIVE (NOT AT [facility])  RAPID URINE DRUG SCREEN, HOSP PERFORMED  BRAIN NATRIURETIC PEPTIDE  SEDIMENTATION RATE  C-REACTIVE PROTEIN  TROPONIN I (HIGH SENSITIVITY)  TROPONIN I (HIGH SENSITIVITY)    EKG EKG Interpretation  Date/Time:  Monday October 06 2020 09:47:54 EST Ventricular Rate:  79 PR Interval:  184 QRS Duration: 86 QT Interval:  382 QTC Calculation: 438 R Axis:   99 Text Interpretation: Normal sinus rhythm Rightward axis Borderline ECG Confirmed by Madalyn Rob [phone]) on 10/06/2020 10:04:02 AM   Radiology DG Chest 2 View  Result Date: 10/06/2020 CLINICAL DATA:  Chest pain, shortness of breath. EXAM: CHEST - 2 VIEW COMPARISON:  September 06, 2020. FINDINGS: The heart size and mediastinal contours are within normal limits. Both lungs are clear. No visible pleural effusions or pneumothorax. No acute osseous abnormality. IMPRESSION: No active cardiopulmonary disease. Electronically Signed   By: Margaretha Sheffield MD   On: 10/06/2020 10:17   VAS [place] LOWER EXTREMITY VENOUS (DVT) (ONLY [facility] & [facility])  Result Date: 10/06/2020  Lower Venous DVT Study Indications: Pain.  Risk Factors: None identified. Limitations: Poor ultrasound/tissue interface. Comparison Study: No prior studies. Performing Technologist: Oliver Hum RVT  Examination Guidelines: A complete evaluation includes B-mode imaging, spectral Doppler, color Doppler, and power Doppler as needed of all accessible portions of each vessel. Bilateral testing is considered an integral part of a complete examination. Limited examinations for reoccurring indications may be performed as noted. The reflux portion of the exam is performed with the patient in reverse Trendelenburg.  +-----+---------------+---------+-----------+----------+--------------+  RIGHTCompressibilityPhasicitySpontaneityPropertiesThrombus Aging +-----+---------------+---------+-----------+----------+--------------+ CFV  Full           Yes      Yes                                 +-----+---------------+---------+-----------+----------+--------------+   +---------+---------------+---------+-----------+----------+--------------+  LEFT     CompressibilityPhasicitySpontaneityPropertiesThrombus Aging +---------+---------------+---------+-----------+----------+--------------+ CFV      Full           Yes      Yes                                 +---------+---------------+---------+-----------+----------+--------------+ SFJ      Full                                                        +---------+---------------+---------+-----------+----------+--------------+ FV Prox  Full                                                        +---------+---------------+---------+-----------+----------+--------------+ FV Mid   Full                                                        +---------+---------------+---------+-----------+----------+--------------+ FV DistalFull                                                        +---------+---------------+---------+-----------+----------+--------------+ PFV      Full                                                        +---------+---------------+---------+-----------+----------+--------------+ POP      Full           Yes      Yes                                 +---------+---------------+---------+-----------+----------+--------------+ PTV      Full                                                        +---------+---------------+---------+-----------+----------+--------------+ PERO     Full                                                        +---------+---------------+---------+-----------+----------+--------------+     Summary: RIGHT: - No evidence of common femoral vein  obstruction.  LEFT: - There is no evidence of deep vein thrombosis in the lower extremity.  - No cystic structure found in the popliteal fossa.  *See table(s) above for measurements and observations.  Preliminary     Procedures Procedures (including critical care time)  Medications Ordered in ED Medications  0.9 %  sodium chloride infusion (has no administration in time range)    ED Course  I have reviewed the triage vital signs and the nursing notes.  Pertinent labs & imaging results that were available during my care of the patient were reviewed by me and considered in my medical decision making (see chart for details).    MDM Rules/Calculators/A&P                          Robert Chan is a 64 y.o. male with a past medical history significant for asthma, COPD not on home oxygen, prior cocaine use most recent use 5 days ago, prior NSTEMI, depression, hypertension, and homelessness who presents with chest pain. Patient reports that his chest pain is been ongoing for the last 3 days and appears to be exertional. He said it is worse whenever he does anything. He denies any pleuritic discomfort but does report getting associated shortness of breath with exertion. He also reports getting fatigued and diaphoretic with the chest discomfort. Reports is worse was a 10 and is currently a 5 on arrival. He reports no significant nausea or vomiting. Denies recent trauma. He reports for months he has had pain in his left leg which is unchanged and similar to the pain in his back. He reports this not any different. He denies any injuries. Denies any constipation, diarrhea, or urinary symptoms. He says that his last cocaine use was 5 days ago and he is only had 3 days of chest pain and they do not seem to related.  On arrival, chest is nontender. Lungs were clear. Abdomen nontender. Good pulses in extremities. Legs have some mild edema but he reports it is unchanged from his baseline. No other focal  neurologic deficits. Patient resting comfortably.  Clinically I am somewhat concerned of this patient with multiple comorbidities and history of NSTEMI in the chart has crushing chest pain with associated diaphoresis, shortness of breath, it is exertional in nature. We will get screening labs including troponins. Anticipate touching base with cardiology to determine disposition when work-up is completed. Given the description of his leg pain being chronic, low suspicion for DVT causing his leg pain but we will get an ultrasound if possible tonight.   Anticipate reassessment after work-up.  7:44 PM Work-up is begun to return. Delta troponin is negative both times and metabolic panel only showed slight increase in his creatinine up to 1.5 from 1.3. CBC reassuring. Chest x-ray shows no acute abnormalities. EKG does not show STEMI.  I reviewed the patient's chart and it does show that his last heart catheterization several months ago showed he had 30% proximal LAD and 20% RCA stenosis. It did not appear he needed stenting at that time however given his report that he has not used cocaine this weekend and he is having pressure-like left-sided central chest pain with diaphoresis, shortness of breath, nausea, that is exertional, I will call cardiology to see if they feel he needs admission for monitoring or discharge for outpatient follow-up.  11:30 PM Just spoke to cardiology with [facility] cardiology.  They do feel he should be admitted to medicine service and they did request we get some other work-up.  They say that they will come see him in the morning but they request ESR and CRP to help rule out a pericarditis, D-dimer, BNP,  UDS, and give him some fluids for his elevated creatinine.  All these tests were ordered and will call medicine for admission.  Patient is in agreement with plan.    Final Clinical Impression(s) / ED Diagnoses Final diagnoses:  Precordial pain  Exertional chest pain      Clinical Impression: 1. Precordial pain   2. Exertional chest pain     Disposition: Admit  This note was prepared with assistance of Dragon voice recognition software. Occasional wrong-word or sound-a-like substitutions may have occurred due to the inherent limitations of voice recognition software.     Ryota Treece, Gwenyth Allegra, MD 10/06/20 [phone]

## 2020-10-07 ENCOUNTER — Telehealth: Payer: Self-pay | Admitting: Critical Care Medicine

## 2020-10-07 DIAGNOSIS — R072 Precordial pain: Secondary | ICD-10-CM | POA: Diagnosis not present

## 2020-10-07 DIAGNOSIS — R079 Chest pain, unspecified: Secondary | ICD-10-CM | POA: Diagnosis present

## 2020-10-07 DIAGNOSIS — F17210 Nicotine dependence, cigarettes, uncomplicated: Secondary | ICD-10-CM | POA: Insufficient documentation

## 2020-10-07 DIAGNOSIS — I201 Angina pectoris with documented spasm: Secondary | ICD-10-CM | POA: Diagnosis not present

## 2020-10-07 DIAGNOSIS — R69 Illness, unspecified: Secondary | ICD-10-CM | POA: Diagnosis not present

## 2020-10-07 DIAGNOSIS — I10 Essential (primary) hypertension: Secondary | ICD-10-CM

## 2020-10-07 DIAGNOSIS — Z91199 Patient's noncompliance with other medical treatment and regimen due to unspecified reason: Secondary | ICD-10-CM | POA: Insufficient documentation

## 2020-10-07 DIAGNOSIS — Z9119 Patient's noncompliance with other medical treatment and regimen: Secondary | ICD-10-CM | POA: Insufficient documentation

## 2020-10-07 DIAGNOSIS — Z87891 Personal history of nicotine dependence: Secondary | ICD-10-CM | POA: Insufficient documentation

## 2020-10-07 HISTORY — DX: Angina pectoris with documented spasm: I20.1

## 2020-10-07 LAB — RAPID URINE DRUG SCREEN, HOSP PERFORMED
Amphetamines: NOT DETECTED
Barbiturates: NOT DETECTED
Benzodiazepines: NOT DETECTED
Cocaine: POSITIVE — AB
Opiates: NOT DETECTED
Tetrahydrocannabinol: NOT DETECTED

## 2020-10-07 LAB — COMPREHENSIVE METABOLIC PANEL
ALT: 20 U/L (ref 0–44)
AST: 23 U/L (ref 15–41)
Albumin: 3.4 g/dL — ABNORMAL LOW (ref 3.5–5.0)
Alkaline Phosphatase: 48 U/L (ref 38–126)
Anion gap: 9 (ref 5–15)
BUN: 14 mg/dL (ref 8–23)
CO2: 23 mmol/L (ref 22–32)
Calcium: 8.6 mg/dL — ABNORMAL LOW (ref 8.9–10.3)
Chloride: 108 mmol/L (ref 98–111)
Creatinine, Ser: 1.38 mg/dL — ABNORMAL HIGH (ref 0.61–1.24)
GFR, Estimated: 57 mL/min — ABNORMAL LOW (ref >60)
Glucose, Bld: 105 mg/dL — ABNORMAL HIGH (ref 70–99)
Potassium: 4.1 mmol/L (ref 3.5–5.1)
Sodium: 140 mmol/L (ref 135–145)
Total Bilirubin: 0.6 mg/dL (ref 0.3–1.2)
Total Protein: 6.5 g/dL (ref 6.5–8.1)

## 2020-10-07 LAB — CBC
HCT: 37.8 % — ABNORMAL LOW (ref 39.0–52.0)
Hemoglobin: 11.6 g/dL — ABNORMAL LOW (ref 13.0–17.0)
MCH: 30 pg (ref 26.0–34.0)
MCHC: 30.7 g/dL (ref 30.0–36.0)
MCV: 97.7 fL (ref 80.0–100.0)
Platelets: 246 10*3/uL (ref 150–400)
RBC: 3.87 MIL/uL — ABNORMAL LOW (ref 4.22–5.81)
RDW: 12.2 % (ref 11.5–15.5)
WBC: 8.2 10*3/uL (ref 4.0–10.5)
nRBC: 0 % (ref 0.0–0.2)

## 2020-10-07 LAB — BRAIN NATRIURETIC PEPTIDE: B Natriuretic Peptide: 48.5 pg/mL (ref 0.0–100.0)

## 2020-10-07 LAB — D-DIMER, QUANTITATIVE: D-Dimer, Quant: 0.38 ug/mL-FEU (ref 0.00–0.50)

## 2020-10-07 LAB — RESP PANEL BY RT-PCR (FLU A&B, COVID) ARPGX2
Influenza A by PCR: NEGATIVE
Influenza B by PCR: NEGATIVE
SARS Coronavirus 2 by RT PCR: NEGATIVE

## 2020-10-07 LAB — C-REACTIVE PROTEIN: CRP: 0.5 mg/dL (ref <1.0)

## 2020-10-07 LAB — SEDIMENTATION RATE: Sed Rate: 14 mm/hr (ref 0–16)

## 2020-10-07 MED ORDER — ONDANSETRON HCL 4 MG/2ML IJ SOLN: 4.0000 mg | Freq: Four times a day (QID) | INTRAMUSCULAR | Status: DC | PRN

## 2020-10-07 MED ORDER — IPRATROPIUM-ALBUTEROL 0.5-2.5 (3) MG/3ML IN SOLN
3.0000 mL | RESPIRATORY_TRACT | Status: DC
  Administered 2020-10-07: 3 mL via RESPIRATORY_TRACT
  Filled 2020-10-07: qty 3

## 2020-10-07 MED ORDER — TRAZODONE HCL 50 MG PO TABS: 25.0000 mg | ORAL_TABLET | Freq: Every evening | ORAL | Status: DC | PRN

## 2020-10-07 MED ORDER — FLUTICASONE FUROATE-VILANTEROL 200-25 MCG/INH IN AEPB
1.0000 | INHALATION_SPRAY | Freq: Every day | RESPIRATORY_TRACT | Status: DC
  Filled 2020-10-07: qty 28

## 2020-10-07 MED ORDER — BUPROPION HCL ER (XL) 150 MG PO TB24
300.0000 mg | ORAL_TABLET | Freq: Every day | ORAL | Status: DC
  Administered 2020-10-07: 300 mg via ORAL
  Filled 2020-10-07: qty 2

## 2020-10-07 MED ORDER — FOLIC ACID 1 MG PO TABS
1.0000 mg | ORAL_TABLET | Freq: Every day | ORAL | Status: DC
  Administered 2020-10-07: 1 mg via ORAL
  Filled 2020-10-07: qty 1

## 2020-10-07 MED ORDER — ACETAMINOPHEN 325 MG PO TABS: 650.0000 mg | ORAL_TABLET | ORAL | Status: DC | PRN

## 2020-10-07 MED ORDER — ATORVASTATIN CALCIUM 10 MG PO TABS
40.0000 mg | ORAL_TABLET | Freq: Every day | ORAL | Status: DC
  Administered 2020-10-07: 40 mg via ORAL
  Filled 2020-10-07: qty 4

## 2020-10-07 MED ORDER — ASPIRIN EC 81 MG PO TBEC
81.0000 mg | DELAYED_RELEASE_TABLET | Freq: Every day | ORAL | Status: DC
  Administered 2020-10-07: 81 mg via ORAL
  Filled 2020-10-07: qty 1

## 2020-10-07 MED ORDER — THIAMINE HCL 100 MG PO TABS
100.0000 mg | ORAL_TABLET | Freq: Every day | ORAL | Status: DC
  Administered 2020-10-07: 100 mg via ORAL
  Filled 2020-10-07: qty 1

## 2020-10-07 MED ORDER — ARIPIPRAZOLE 10 MG PO TABS
10.0000 mg | ORAL_TABLET | Freq: Every day | ORAL | Status: DC
  Filled 2020-10-07: qty 1

## 2020-10-07 MED ORDER — ENOXAPARIN SODIUM 40 MG/0.4ML ~~LOC~~ SOLN: 40.0000 mg | Freq: Every day | SUBCUTANEOUS | Status: DC

## 2020-10-07 MED ORDER — AMLODIPINE BESYLATE 5 MG PO TABS
10.0000 mg | ORAL_TABLET | Freq: Every day | ORAL | Status: DC
  Administered 2020-10-07: 10 mg via ORAL
  Filled 2020-10-07: qty 2

## 2020-10-07 MED ORDER — HYDROXYZINE HCL 25 MG PO TABS: 25.0000 mg | ORAL_TABLET | Freq: Three times a day (TID) | ORAL | Status: DC | PRN

## 2020-10-07 MED ORDER — VITAMIN D 25 MCG (1000 UNIT) PO TABS
1000.0000 [IU] | ORAL_TABLET | Freq: Every day | ORAL | Status: DC
  Administered 2020-10-07: 1000 [IU] via ORAL
  Filled 2020-10-07: qty 1

## 2020-10-07 NOTE — H&P (Addendum)
Date: 10/07/2020               Patient Name:  Robert Chan MRN: [id number]  DOB: Feb 18, 1956 Age / Sex: 64 y.o., male   PCP: Elsie Stain, MD         Medical Service: Internal Medicine Teaching Service         Attending Physician: Dr. Lucious Groves, DO    First Contact: Dr. Linwood Dibbles, MD Pager: [phone]  Second Contact: Dr. Marianna Payment, DO Pager: [phone]       After Hours (After 5p/  First Contact Pager: [phone]  weekends / holidays): Second Contact Pager: [phone]   Chief Complaint: substernal chest pain  History of Present Illness:   Mr. Ruddock is a 64 year old male with history of homelessness, HTN, CAD, NSTEMI, asthma, COPD (not on home O2), CKD, depression, and cocaine use disorder presenting to the ED with substernal chest pain for the past 3 days. He reports that the pain began this past Thursday or Friday while he was walking and has been constant since then. It started as 8/10 pain and today it is about 7/10. He states that it feels like someone is sitting on his chest. Nothing makes the pain better but it is exacerbated by exertion. He states that the pain does not radiate anywhere and that it is not worse with lying flat or leaning forward. He reports that he had a similar chest pain in the past. He does endorse associated SHOB with exertion that began alongside his chest pain. He states that his chest tightness is what is making it hard for him to breathe. He denies nausea, vomiting, fevers, trouble or pain with swallowing, cough, palpitations, back pain, abdominal pain, constipation, diarrhea, changes in urination, and leg swelling.   Patient has a history of cocaine use which has been ongoing for "years." He reports that he uses cocaine about once a month. It is about $100-200 worth of cocaine each time. He also endorses smoking about 3-4 cigarettes per day for over 20 years and drinks about 5-6 40oz beers per week.  ED course: CBC unremarkable. BMP  showing glucose 125 and Cr 1.54. Troponins negative x2. D-dimer, CRP, and BNP negative. UDS positive for cocaine. ESR pending. CXR negative. ED physician consulted [facility] for further evaluation. IMTS asked to admit patient.   Meds:  No current facility-administered medications on file prior to encounter.   Current Outpatient Medications on File Prior to Encounter  Medication Sig Dispense Refill  . albuterol (PROVENTIL) (2.5 MG/3ML) 0.083% nebulizer solution Take 3 mLs (2.5 mg total) by nebulization every 6 (six) hours as needed for wheezing or shortness of breath. 150 mL 1  . albuterol (VENTOLIN HFA) 108 (90 Base) MCG/ACT inhaler Inhale 1-2 puffs into the lungs every 6 (six) hours as needed for wheezing or shortness of breath. 90 g 2  . amLODipine (NORVASC) 10 MG tablet Take 1 tablet (10 mg total) by mouth daily. 90 tablet 1  . ARIPiprazole (ABILIFY) 10 MG tablet Take 1 tablet (10 mg total) by mouth at bedtime. 30 tablet 3  . aspirin EC 81 MG tablet Take 1 tablet (81 mg total) by mouth daily. Swallow whole. 30 tablet 11  . atorvastatin (LIPITOR) 40 MG tablet Take 1 tablet (40 mg total) by mouth daily. 90 tablet 1  . buPROPion (WELLBUTRIN XL) 300 MG 24 hr tablet Take 1 tablet (300 mg total) by mouth daily. 30 tablet 0  . folic acid (  FOLVITE) 1 MG tablet Take 1 tablet (1 mg total) by mouth daily. 30 tablet 1  . hydrOXYzine (ATARAX/VISTARIL) 25 MG tablet Take 1 tablet (25 mg total) by mouth 3 (three) times daily as needed for anxiety. 60 tablet 1  . nicotine (NICODERM CQ - DOSED IN MG/24 HOURS) 21 mg/24hr patch Place 1 patch (21 mg total) onto the skin daily. 28 patch 0  . SYMBICORT 160-4.5 MCG/ACT inhaler Inhale 2 puffs into the lungs in the morning and at bedtime. 10.2 g 11  . thiamine 100 MG tablet Take 1 tablet (100 mg total) by mouth daily. 30 tablet 1  . traZODone (DESYREL) 50 MG tablet Take 0.5 tablets (25 mg total) by mouth at bedtime as needed and may repeat dose one time if  needed for sleep. 60 tablet 0  . Vitamin D3 (VITAMIN D) 25 MCG tablet Take 1 tablet (1,000 Units total) by mouth daily. 60 tablet 0     Allergies: Allergies as of 10/06/2020 - Review Complete 09/29/2020  Allergen Reaction Noted  . Shellfish allergy Hives and Rash 06/04/2020    Past Medical History:  Diagnosis Date  . Asthma   . CAD (coronary artery disease)   . Cataract   . CKD (chronic kidney disease)   . COPD (chronic obstructive pulmonary disease) ([place])   . Depression   . Hypertension   . NSTEMI (non-ST elevated myocardial infarction) ([place])   . Suicidal ideation 06/05/2020   Past Surgical History:  Procedure Laterality Date  . APPENDECTOMY    . EYE SURGERY    . LEFT HEART CATH AND CORONARY ANGIOGRAPHY N/A 06/05/2020   Procedure: LEFT HEART CATH AND CORONARY ANGIOGRAPHY;  Surgeon: Nigel Mormon, MD;  Location: [facility];  Service: Cardiovascular;  Laterality: N/A;  . PROSTATE SURGERY      Family History:  Family History  Problem Relation Age of Onset  . Hypertension Mother   . Diabetes Mother   . Hypertension Father   . Hypertension Sister   - brother --> deceased from drug overdose   Social History:  Social History   Socioeconomic History  . Marital status: Single    Spouse name: Not on file  . Number of children: Not on file  . Years of education: Not on file  . Highest education level: Not on file  Occupational History  . Not on file  Tobacco Use  . Smoking status: Current Every Day Smoker  . Smokeless tobacco: Never Used  . Tobacco comment: smokes 3-4 cigarettes/day  Substance and Sexual Activity  . Alcohol use: Yes    Comment: drinks 2x/wk, 2-3 40 oz drinks, last drink Friday  . Drug use: Yes    Types: Cocaine    Comment: cocaine use once a month, last use Friday   . Sexual activity: Not Currently  Other Topics Concern  . Not on file  Social History Narrative  . Not on file   Social Determinants of Health   Financial Resource  Strain:   . Difficulty of Paying Living Expenses: Not on file  Food Insecurity:   . Worried About Charity fundraiser in the Last Year: Not on file  . Ran Out of Food in the Last Year: Not on file  Transportation Needs:   . Lack of Transportation (Medical): Not on file  . Lack of Transportation (Non-Medical): Not on file  Physical Activity:   . Days of Exercise per Week: Not on file  . Minutes of Exercise per  Session: Not on file  Stress:   . Feeling of Stress : Not on file  Social Connections:   . Frequency of Communication with Friends and Family: Not on file  . Frequency of Social Gatherings with Friends and Family: Not on file  . Attends Religious Services: Not on file  . Active Member of Clubs or Organizations: Not on file  . Attends Archivist Meetings: Not on file  . Marital Status: Not on file  Intimate Partner Violence:   . Fear of Current or Ex-Partner: Not on file  . Emotionally Abused: Not on file  . Physically Abused: Not on file  . Sexually Abused: Not on file     Review of Systems: A complete ROS was negative except as per HPI.   Physical Exam: Blood pressure 118/74, pulse 60, temperature 98.1 F (36.7 C), temperature source Oral, resp. rate 19, SpO2 100 %. Physical Exam Constitutional:      Appearance: He is obese. He is not ill-appearing.  HENT:     Head: Normocephalic and atraumatic.     Mouth/Throat:     Mouth: Mucous membranes are moist.     Pharynx: Oropharynx is clear. No oropharyngeal exudate.  Eyes:     General: No scleral icterus.    Extraocular Movements: Extraocular movements intact.     Conjunctiva/sclera: Conjunctivae normal.     Pupils: Pupils are equal, round, and reactive to light.  Cardiovascular:     Rate and Rhythm: Normal rate and regular rhythm.     Pulses: Normal pulses.     Heart sounds: Normal heart sounds. No murmur heard.  No friction rub. No gallop.   Pulmonary:     Effort: Pulmonary effort is normal.      Breath sounds: Normal breath sounds. No stridor. No rhonchi or rales.     Comments: Diffuse wheezing bilaterally. Abdominal:     General: Bowel sounds are normal. There is no distension.     Palpations: Abdomen is soft.     Tenderness: There is no abdominal tenderness. There is no guarding or rebound.  Musculoskeletal:        General: Normal range of motion.     Cervical back: Normal range of motion.     Comments: Trace edema in bilateral lower extremities.  Skin:    General: Skin is warm and dry.     Capillary Refill: Capillary refill takes less than 2 seconds.     Findings: No rash.  Neurological:     General: No focal deficit present.     Mental Status: He is alert and oriented to person, place, and time.     Motor: No weakness.  Psychiatric:        Mood and Affect: Mood normal.        Behavior: Behavior normal.        Thought Content: Thought content normal.     EKG: personally reviewed my interpretation is: Normal sinus rhythm, rightward axis. ST elevations noted in II, III, aVF, and V6.   DG Chest 2 View  Result Date: 10/06/2020 CLINICAL DATA:  Chest pain, shortness of breath. EXAM: CHEST - 2 VIEW COMPARISON:  September 06, 2020. FINDINGS: The heart size and mediastinal contours are within normal limits. Both lungs are clear. No visible pleural effusions or pneumothorax. No acute osseous abnormality. IMPRESSION: No active cardiopulmonary disease. Electronically Signed   By: Margaretha Sheffield MD   On: 10/06/2020 10:17   VAS [place] LOWER EXTREMITY VENOUS (DVT) (ONLY [facility] &  [facility])  Result Date: 10/06/2020  Lower Venous DVT Study Indications: Pain.  Risk Factors: None identified. Limitations: Poor ultrasound/tissue interface. Comparison Study: No prior studies. Performing Technologist: Oliver Hum RVT  Examination Guidelines: A complete evaluation includes B-mode imaging, spectral Doppler, color Doppler, and power Doppler as needed of all accessible portions of each vessel. Bilateral  testing is considered an integral part of a complete examination. Limited examinations for reoccurring indications may be performed as noted. The reflux portion of the exam is performed with the patient in reverse Trendelenburg.  +-----+---------------+---------+-----------+----------+--------------+ RIGHTCompressibilityPhasicitySpontaneityPropertiesThrombus Aging +-----+---------------+---------+-----------+----------+--------------+ CFV  Full           Yes      Yes                                 +-----+---------------+---------+-----------+----------+--------------+   +---------+---------------+---------+-----------+----------+--------------+ LEFT     CompressibilityPhasicitySpontaneityPropertiesThrombus Aging +---------+---------------+---------+-----------+----------+--------------+ CFV      Full           Yes      Yes                                 +---------+---------------+---------+-----------+----------+--------------+ SFJ      Full                                                        +---------+---------------+---------+-----------+----------+--------------+ FV Prox  Full                                                        +---------+---------------+---------+-----------+----------+--------------+ FV Mid   Full                                                        +---------+---------------+---------+-----------+----------+--------------+ FV DistalFull                                                        +---------+---------------+---------+-----------+----------+--------------+ PFV      Full                                                        +---------+---------------+---------+-----------+----------+--------------+ POP      Full           Yes      Yes                                 +---------+---------------+---------+-----------+----------+--------------+ PTV      Full                                                         +---------+---------------+---------+-----------+----------+--------------+  PERO     Full                                                        +---------+---------------+---------+-----------+----------+--------------+     Summary: RIGHT: - No evidence of common femoral vein obstruction.  LEFT: - There is no evidence of deep vein thrombosis in the lower extremity.  - No cystic structure found in the popliteal fossa.  *See table(s) above for measurements and observations.    Preliminary      Assessment & Plan by Problem: Active Problems:   Chest pain  Mr. Italiano is a 64 yo male with history of homelessness, HTN, CAD, NSTEMI, asthma, COPD (not on home O2), CKD, depression, and cocaine use disorder presenting with substernal chest pain and exertional dyspnea for the past 3 days.  Substernal chest pain/pressure Patient with hx of NSTEMI, CAD, HTN, and cocaine use disorder presenting with substernal chest pain/pressure with associated dyspnea with exertion for the past 2-3 days. Patient had a left heart cath done 4 months ago showing 30% stenosis of proximal LAD and 20% stenosis of mid-RCA. ECHO done last month showing EF 60-65% and G1DD. Low suspicion for ACS as troponins were negative x2. Heart sounds are not distant and no JVD, hypotension, or leg swelling noted on exam so low suspicion for tamponade. BP stable, CXR not showing enlarged cardiac silhouette, D-dimer negative, and chest pain not radiating to back so low suspicion for aortic dissection. He does not have any symptoms of reflux, dysphagia, odynophagia, or hematemesis so low suspicion for esophageal rupture or impaction. Venous dopplers negative for DVT, D-dimer negative, and breath sounds present bilaterally so low suspicion for PTX or PE at this time. Although EKG is showing possible diffuse ST elevations (?) concerning for pericarditis, his chest pain is not pleuritic in nature and I did not appreciate a pericardial friction  rub on exam. The ST elevations seen on EKG could be 2/2 coronary vasospasm from cocaine use, which would also explain his angina at rest. His last use of cocaine was 2-3 days ago, which is around the same time that his chest pain began. [facility] cardiology to evaluate patient in the AM -will hold off on ECHO until cardiology evaluates patient -cardiac tele -may benefit from substance use counseling for avoidance of cocaine -f/u CMP   Asthma COPD Patient with history of COPD and asthma on symbicort and albuterol inhalers at home. On exam, he does have diffuse wheezing bilaterally. No cough or sputum production, but patient does have dyspnea on exertion so this could possibly be a mild COPD exacerbation. However, it would be unusual for COPD to cause substernal chest pain. -breo ellipta daily -scheduled duonebs q4h for 3 doses   HTN CAD Patient with history of CAD and HTN, on norvasc 62m, lipitor 44m and ASA 8126mt home. BP stable on admission. -continue home medications   CKD Baseline creatinine around 1.2-1.4. On admission, Cr 1.54. He is able to urinate regularly at home. - Monitor UOP and I/O's - Trend BMP - avoid nephrotoxic medications   Mild anemia Chronic and stable. Hgb 12.5. B12 level in August 2021 showed borderline B12 deficiency. Iron levels normal in May 2021.  -continue to trend hemoglobin -if worsened, consider B12 supplementation to see if it corrects anemia   Depression Patient with  history of depression and anxiety. On trazodone 68m qhs prn, abilify 18mqhs, and wellbutrin 30023maily for depression and on hydroxyzine 9m87mD prn for anxiety at home. -continue home medications   Dispo: Admit patient to Observation with expected length of stay less than 2 midnights.  Signed: JinwVirl Axe 10/07/2020, 3:13 AM  Pager: [phone] 5pm on weekdays and 1pm on weekends: On Call pager: [phone]

## 2020-10-07 NOTE — Consult Note (Signed)
CARDIOLOGY CONSULT NOTE  Patient ID: Robert Chan MRN: [id number] DOB/AGE: September 11, 1956 64 y.o.  Admit date: 10/06/2020 Attending physician: Lucious Groves, DO Primary Physician:  Elsie Stain, MD Outpatient Cardiologist: NA Inpatient Cardiologist: Rex Kras, DO, [facility]  Chief complaint: Chest pain  HPI:  Aadyn Buchheit is a 63 y.o. African-American male who presents with a chief complaint of " chest pain." His past medical history and cardiovascular risk factors include: Minimal CAD, history of non-STEMI felt to be due to cocaine use, hypertension, active tobacco/cocaine use, history of left lower extremity DVT, history of noncompliance.  Patient presents to the hospital complaining of chest pain/pressure.  The symptoms started several days ago after using cocaine.  Because symptoms did not resolve patient presented to the hospital for further evaluation and management.  Patient symptoms are suggestive of typical anginal chest pain and he has had this during his prior hospitalizations back in August and November 2021.  Patient states that his chest pain is improving as compared to when he initially came in.  This time patient states that the pain is located substernally and also over the left anterior chest wall, worse with activity and improves with resting.  At times it is pleuritic in nature.  Not associated with change in position and no pain between his shoulder blades.  No recent infections or exposure to sick contacts.  Cardiology was consulted for chest pain evaluation.  ALLERGIES: Allergies  Allergen Reactions  . Shellfish Allergy Hives and Rash    PAST MEDICAL HISTORY: Past Medical History:  Diagnosis Date  . Asthma   . CAD (coronary artery disease)   . Cataract   . CKD (chronic kidney disease)   . COPD (chronic obstructive pulmonary disease) ([place])   . Depression   . Hypertension   . NSTEMI (non-ST elevated myocardial infarction) ([place])   . Suicidal ideation  06/05/2020    PAST SURGICAL HISTORY: Past Surgical History:  Procedure Laterality Date  . APPENDECTOMY    . EYE SURGERY    . LEFT HEART CATH AND CORONARY ANGIOGRAPHY N/A 06/05/2020   Procedure: LEFT HEART CATH AND CORONARY ANGIOGRAPHY;  Surgeon: Nigel Mormon, MD;  Location: [place] [facility];  Service: Cardiovascular;  Laterality: N/A;  . PROSTATE SURGERY      FAMILY HISTORY: The patient family history includes Diabetes in his mother; Hypertension in his father, mother, and sister.   SOCIAL HISTORY:  The patient  reports that he has been smoking. He has never used smokeless tobacco. He reports current alcohol use. He reports current drug use. Drug: Cocaine.  MEDICATIONS: Current Outpatient Medications  Medication Instructions  . albuterol (PROVENTIL) 2.5 mg, Nebulization, Every 6 hours PRN  . albuterol (VENTOLIN HFA) 108 (90 Base) MCG/ACT inhaler 1-2 puffs, Inhalation, Every 6 hours PRN  . amLODipine (NORVASC) 10 mg, Oral, Daily  . ARIPiprazole (ABILIFY) 10 mg, Oral, Daily at bedtime  . aspirin EC 81 mg, Oral, Daily, Swallow whole.  Marland Kitchen atorvastatin (LIPITOR) 40 mg, Oral, Daily  . buPROPion (WELLBUTRIN XL) 300 mg, Oral, Daily  . folic acid (FOLVITE) 1 mg, Oral, Daily  . hydrOXYzine (ATARAX/VISTARIL) 25 mg, Oral, 3 times daily PRN  . nicotine (NICODERM CQ - DOSED IN MG/24 HOURS) 21 mg, Transdermal, Daily  . SYMBICORT 160-4.5 MCG/ACT inhaler 2 puffs, Inhalation, 2 times daily  . thiamine 100 mg, Oral, Daily  . traZODone (DESYREL) 25 mg, Oral, At bedtime PRN and repeat x1 PRN  . Vitamin D3 (VITAMIN D) 1,000 Units, Oral, Daily  Review of Systems  Constitutional: Negative for chills and fever.  HENT: Negative for hoarse voice and nosebleeds.   Eyes: Negative for discharge, double vision and pain.  Cardiovascular: Positive for chest pain (improving). Negative for claudication, dyspnea on exertion, leg swelling, near-syncope, orthopnea, palpitations, paroxysmal nocturnal  dyspnea and syncope.  Respiratory: Negative for hemoptysis and shortness of breath.   Musculoskeletal: Negative for muscle cramps and myalgias.  Gastrointestinal: Negative for abdominal pain, constipation, diarrhea, hematemesis, hematochezia, melena, nausea and vomiting.  Neurological: Negative for dizziness and light-headedness.  All other systems reviewed and are negative.   PHYSICAL EXAM: Vitals with BMI 10/07/2020 10/07/2020 10/07/2020  Height - - -  Weight - - -  BMI - - -  Systolic [phone]  Diastolic 72 46 67  Pulse 62 55 63  Some encounter information is confidential and restricted. Go to Review Flowsheets activity to see all data.    No intake or output data in the 24 hours ending 10/07/20 0925  Net IO Since Admission: No IO data has been entered for this period [10/07/20 0925]  CONSTITUTIONAL: Well-developed and well-nourished. No acute distress.  SKIN: Skin is warm and dry. No rash noted. No cyanosis. No pallor. No jaundice HEAD: Normocephalic and atraumatic.  EYES: No scleral icterus MOUTH/THROAT: Moist oral membranes.  NECK: No JVD present. No thyromegaly noted. No carotid bruits  LYMPHATIC: No visible cervical adenopathy.  CHEST Normal respiratory effort. No intercostal retractions  LUNGS: Clear to auscultation bilaterally.  No stridor. No wheezes. No rales.  CARDIOVASCULAR: Regular, positive S1-S2, no murmurs rubs or gallops appreciated ABDOMINAL: Obese, soft, nontender, nondistended, positive bowel sounds in all 4 quadrants, no apparent ascites.  EXTREMITIES: No peripheral edema  HEMATOLOGIC: No significant bruising NEUROLOGIC: Oriented to person, place, and time. Nonfocal. Normal muscle tone.  PSYCHIATRIC: Normal mood and affect. Normal behavior. Cooperative  RADIOLOGY: DG Chest 2 View  Result Date: 10/06/2020 CLINICAL DATA:  Chest pain, shortness of breath. EXAM: CHEST - 2 VIEW COMPARISON:  September 06, 2020. FINDINGS: The heart size and mediastinal contours  are within normal limits. Both lungs are clear. No visible pleural effusions or pneumothorax. No acute osseous abnormality. IMPRESSION: No active cardiopulmonary disease. Electronically Signed   By: Margaretha Sheffield MD   On: 10/06/2020 10:17   VAS [place] LOWER EXTREMITY VENOUS (DVT) (ONLY [facility] & [facility])  Result Date: 10/06/2020  Lower Venous DVT Study Indications: Pain.  Risk Factors: None identified. Limitations: Poor ultrasound/tissue interface. Comparison Study: No prior studies. Performing Technologist: Oliver Hum RVT  Examination Guidelines: A complete evaluation includes B-mode imaging, spectral Doppler, color Doppler, and power Doppler as needed of all accessible portions of each vessel. Bilateral testing is considered an integral part of a complete examination. Limited examinations for reoccurring indications may be performed as noted. The reflux portion of the exam is performed with the patient in reverse Trendelenburg.  +-----+---------------+---------+-----------+----------+--------------+ RIGHTCompressibilityPhasicitySpontaneityPropertiesThrombus Aging +-----+---------------+---------+-----------+----------+--------------+ CFV  Full           Yes      Yes                                 +-----+---------------+---------+-----------+----------+--------------+   +---------+---------------+---------+-----------+----------+--------------+ LEFT     CompressibilityPhasicitySpontaneityPropertiesThrombus Aging +---------+---------------+---------+-----------+----------+--------------+ CFV      Full           Yes      Yes                                 +---------+---------------+---------+-----------+----------+--------------+  SFJ      Full                                                        +---------+---------------+---------+-----------+----------+--------------+ FV Prox  Full                                                         +---------+---------------+---------+-----------+----------+--------------+ FV Mid   Full                                                        +---------+---------------+---------+-----------+----------+--------------+ FV DistalFull                                                        +---------+---------------+---------+-----------+----------+--------------+ PFV      Full                                                        +---------+---------------+---------+-----------+----------+--------------+ POP      Full           Yes      Yes                                 +---------+---------------+---------+-----------+----------+--------------+ PTV      Full                                                        +---------+---------------+---------+-----------+----------+--------------+ PERO     Full                                                        +---------+---------------+---------+-----------+----------+--------------+     Summary: RIGHT: - No evidence of common femoral vein obstruction.  LEFT: - There is no evidence of deep vein thrombosis in the lower extremity.  - No cystic structure found in the popliteal fossa.  *See table(s) above for measurements and observations.    Preliminary     LABORATORY DATA: Lab Results  Component Value Date   WBC 8.2 10/07/2020   HGB 11.6 (L) 10/07/2020   HCT 37.8 (L) 10/07/2020   MCV 97.7 10/07/2020   PLT 246 10/07/2020    Recent Labs  Lab 10/07/20 0535  NA 140  K 4.1  CL 108  CO2 23  BUN 14  CREATININE 1.38*  CALCIUM 8.6*  PROT 6.5  BILITOT 0.6  ALKPHOS 48  ALT 20  AST 23  GLUCOSE 105*    Lipid Panel     Component Value Date/Time   CHOL 172 09/09/2020 0654   TRIG 76 09/09/2020 0654   HDL 71 09/09/2020 0654   CHOLHDL 2.4 09/09/2020 0654   VLDL 15 09/09/2020 0654   LDLCALC 86 09/09/2020 0654    BNP (last 3 results) Recent Labs    05/02/20 0658 09/06/20 0757 10/06/20 2319  BNP 45.8  92.6 48.5    HEMOGLOBIN A1C Lab Results  Component Value Date   HGBA1C 4.8 09/07/2020   MPG 91.06 09/07/2020    Cardiac Panel (last 3 results) Recent Labs    10/06/20 1033 10/06/20 1616  TROPONINIHS 8 6     TSH Recent Labs    02/28/20 1014 09/09/20 0654  TSH 2.020 2.640      CARDIAC DATABASE: EKG: 10/06/2020: Normal sinus rhythm, 79 bpm, poor R wave progression, concave upsloping of the ST segments in the inferolateral leads without reciprocal changes.  Echocardiogram: 09/07/2020: 1. Left ventricular ejection fraction, by estimation, is 60 to 65%. The left ventricle has normal function. The left ventricle has no regional wall motion abnormalities. Left ventricular diastolic parameters are consistent with Grade I diastolic dysfunction (impaired relaxation).  2. Right ventricular systolic function is normal. The right ventricular size is normal.  3. The mitral valve is normal in structure. No evidence of mitral valve regurgitation. No evidence of mitral stenosis.  4. The aortic valve is normal in structure. Aortic valve regurgitation is not visualized. No aortic stenosis is present.   Stress Testing:  None  Heart Catheterization: Coronary angiogram 06/05/2020: LM: Normal LAD: Prox 30% stenosis. Mid LAD myocardial bridge Slow flow improved with IC NTG LCx: Normal RCA: Mid 20% disease  Mild nonobstructive CAD Chest pain and trop elevation due to cocaine induced vasospasm  IMPRESSION & RECOMMENDATIONS: Biruk Troia is a 64 y.o. African-American male whose past medical history and cardiovascular risk factors include: Minimal CAD, history of non-STEMI felt to be due to cocaine use, hypertension, active tobacco/cocaine use, history of left lower extremity DVT, history of noncompliance.  Precordial chest pain: Patient symptoms of chest discomfort appear to be typical in nature but most likely secondary to coronary vasospasm as it is associated around the  same time when he used cocaine last. EKG shows normal sinus rhythm without evidence of injury pattern. Cardiac biomarkers are negative x2. D-dimer is within normal limits. EKG shows concave upsloping of the ST segment in inferolateral leads suggestive of possible pericarditis.  Therefore recommended checking inflammatory markers which are also within normal limits.  No recent sick contacts, no recent fevers, and the pain is not always pleuritic in nature, and nonpositional. He recently underwent coronary angiography and noted minimal CAD as noted above. He also underwent recent echocardiogram which noted preserved LVEF without any significant valvular heart disease. At this time I would recommend him to continue his medical therapy and strongly encouraged him to stop using cocaine completely as he has had similar chest discomfort precipitated by cocaine use.  He verbalizes understanding.  Benign essential hypertension: Currently managed by primary team.  Active tobacco/cocaine use: Educated on the importance of complete cessation of nicotine and cocaine use as they can predispose him to worsening cardiovascular comorbidities and mortality.  Noncompliance: Patient has been seen by our practice last 3 times while he has been hospitalized.  He has  failed to follow-up in the clinic for further medication titration.  He is encouraged to follow-up.  For now cardiology will sign off and will see the patient on as needed basis.   Time spent: 85 mins. *Total Encounter Time as defined by the Centers for Medicare and Medicaid Services includes, in addition to the face-to-face time of a patient visit (documented in the note above) non-face-to-face time: obtaining and reviewing outside history, ordering and reviewing medications, tests or procedures, care coordination (communications with other health care professionals or caregivers) and documentation in the medical record.  Patient's questions and concerns  were addressed to his satisfaction. He voices understanding of the instructions provided during this encounter.   This note was created using a voice recognition software as a result there may be grammatical errors inadvertently enclosed that do not reflect the nature of this encounter. Every attempt is made to correct such errors.  Mechele Claude [facility]  Pager: [phone] Office: [phone] 10/07/2020, 9:25 AM

## 2020-10-07 NOTE — ED Notes (Signed)
Lunch Tray Ordered @ 1036. 

## 2020-10-07 NOTE — Progress Notes (Addendum)
Subjective:   Hospital day: 1  Overnight event: NAEOV  States chest pain is improved this morning but still has some slight pain. He also endorses a slight headache and blurred vision which he feels is due to not eating due to being NPO.  Denies nausea, vomiting, leg pain, and dizziness. Discussed that it is unlikely he had a heart attack, and chest pain could be do to cocaine use due nonobstructive coronary disease. He is agreeable to evaluation by cardiology today. States he takes his heart medications regularly up to hospitalization and has not missed doses recently. Note he tends to have ankle swelling of l ankle when not moving around. No prior injury.  Objective:  Vital signs in last 24 hours: Vitals:   10/07/20 0145 10/07/20 0230 10/07/20 0245 10/07/20 0500  BP: 97/80 129/87 118/74 110/67  Pulse: (!) 58 60 60 63  Resp: 16 14 19 15   Temp:      TempSrc:      SpO2: 99% 99% 100% 100%    Physical Exam  General: Pleasant, well-appearing obese male laying in bed. No acute distress. Head: Normocephalic. Atraumatic. CV: RRR. No murmurs, rubs, or gallops. Trace BLE edema. Pulmonary: Lungs CTAB. Normal effort. No wheezing or rales. Abdominal: Soft, nontender, nondistended. Normal bowel sounds. Extremities: Palpable pulses. Normal ROM. Skin: Warm and dry. No obvious rash or lesions. Neuro: A&Ox3. Moves all extremities. Normal sensation. No focal deficit. Psych: Normal mood and affect  Assessment/Plan: Robert Chan is a 64 y.o. male with history of homelessness, HTN, CAD, NSTEMI, asthma, COPD (not on home O2), CKD, depression, and cocaine use disorder presenting with substernal chest pain and exertional dyspnea for the past 3 days and found to have coronary vasospasm 2/2 to cocaine use. Plan for discharge today.   Principal Problem:   Coronary vasospasm (HCC) Active Problems:   Benign hypertension   Cocaine abuse (HCC)   Chest pain  Coronary Vasospasm Chest pain Patient  with hx of NSTEMI, CAD, HTN, and cocaine use disorder presenting with substernal chest pain/pressure with associated dyspnea with exertion for the past 2-3 days. Work up was negative for ACS, PE, HF, pericarditis, pneumothorax. The ST elevations seen on EKG and timing of cocaine use with onset on chest pain makes coronary vasospasm 2/2 cocaine use the most likely cause of patient's chest pain. [facility] cardiology consulted and cleared patient for discharge. Patient hemodynamically stable and chest pain-free at discharge.  --All home meds restarted at discharge --Patient counseled on avoidance of cocaine --Instructed to follow up with PCP and cardiology   Asthma COPD Patient with history of COPD and asthma on symbicort and albuterol inhalers at home. On exam, he does have diffuse wheezing bilaterally. No cough or sputum production, but patient does have dyspnea on exertion so this could possibly be a mild COPD exacerbation. However, it would be unusual for COPD to cause substernal chest pain. He was started on breo ellipta and scheduled duonebs q4h for 3 doses. --Continue home respiratory meds.   HTN CAD Patient with history of CAD and HTN, on norvasc 10mg , lipitor 40mg , and ASA 81mg  at home. BP stable on admission and on day of discharge.  --Continue home medications   CKD Stage 3a Baseline creatinine around 1.2-1.4. On admission, Cr 1.54. He is able to urinate regularly at home. Cr improved to 1.38 before discharge. --Will follow up with BMP check at PCP office   Mild anemia Chronic and stable. Hgb 12.5. B12 level in August 2021 showed borderline  B12 deficiency. Iron levels normal in May 2021.  --Will follow up with PCP    Depression Patient with history of depression and anxiety. On trazodone 25mg  qhs prn, abilify 10mg  qhs, and wellbutrin 300mg  daily for depression and on hydroxyzine 25mg  TID prn for anxiety at home. --Continue home medications  Diet: HH IVF: None VTE: Lovenox 40 mg  daily CODE: Full Code  Prior to Admission Living Arrangement: Home Anticipated Discharge Location: Home Barriers to Discharge: None Dispo: Anticipated discharge today  Signed: Lacinda Axon, MD 10/07/2020, 7:00 AM  Pager: [phone] Internal Medicine Teaching Service After 5pm on weekdays and 1pm on weekends: On Call pager: [phone]

## 2020-10-07 NOTE — ED Notes (Signed)
Pt is at bedside washing up

## 2020-10-07 NOTE — Hospital Course (Addendum)
Coronary Vasospasm Chest pain Patient with hx of NSTEMI, CAD, HTN, and cocaine use disorder presenting with substernal chest pain/pressure with associated dyspnea with exertion for 2-3 days before admission. Work up was negative for ACS, PE, HF, pericarditis, pneumothorax. The ST elevations seen on EKG and timing of cocaine use with onset on chest pain made coronary vasospasm 2/2 cocaine use the most likely cause of patient's chest pain. [facility] cardiology consulted and cleared patient for discharge. Patient hemodynamically stable and chest pain-free at discharge. All home meds restarted at discharge. Patient counseled on avoidance of cocaine. Instructed to follow up with PCP and cardiology   Asthma COPD Patient with history of COPD and asthma on symbicort and albuterol inhalers at home. On exam, he had diffuse wheezing bilaterally. No cough or sputum production. He was started on breo ellipta and scheduled duonebs q4h for 3 doses. Restarted home respiratory medications at discharge.   HTN CAD Patient with history of CAD and HTN, on norvasc 10mg , lipitor 40mg , and ASA 81mg  at home. BP stable on admission and on day of discharge. Continue home medications.   CKD Baseline creatinine around 1.2-1.4. On admission, Cr 1.54. He is able to urinate regularly at home. Cr improved to 1.38 before discharge. Will follow up with BMP check at PCP office   Mild anemia Chronic and stable. Hgb 12.5. B12 level in August 2021 showed borderline B12 deficiency. Iron levels normal in May 2021. Will follow up with PCP    Depression Patient with history of depression and anxiety. On trazodone 25mg  qhs prn, abilify 10mg  qhs, and wellbutrin 300mg  daily for depression and on hydroxyzine 25mg  TID prn for anxiety at home. Continue home medications

## 2020-10-07 NOTE — [facility] Nurse Program (Signed)
  Dept: [facility] Nurse Program Note  Date of Encounter: 10/07/2020  Past Medical History: Past Medical History:  Diagnosis Date  . Asthma   . CAD (coronary artery disease)   . Cataract   . CKD (chronic kidney disease)   . COPD (chronic obstructive pulmonary disease) ([place])   . Depression   . Hypertension   . NSTEMI (non-ST elevated myocardial infarction) ([place])   . Suicidal ideation 06/05/2020    Encounter Details:  CNP Questionnaire - 10/07/20 1400      Questionnaire   Do you give verbal consent to treat you today? Yes    Visit Setting Other    Location Patient Served At Not Applicable    Patient Status Homeless    Medical Provider Yes    Insurance Medicare    Intervention Counsel;Educate;Refer;Support    Housing/Utilities No permanent housing    Referrals PCP - [facility] (Non-diabetic)    ED Visit Averted Yes         brief visit saw client in hallway .States he has been out of town to his nieces funeral in [place] . Doing okay still looking for housing ,stated he wont get frustrated as it has been difficult to find housing. Gave encouragement to move forward.Reminded of his upcoming eye appointment for January ,2022. Reviewed address and appointment  Time. Client needs encouragement to keep moving forward. Mentioned christmas and the need to get grandchildren gifts ,encouraged to focus on his goals and not over extend himself . Will continue to follow .

## 2020-10-07 NOTE — ED Provider Notes (Signed)
I assumed care of this patient.  Please see previous provider note for further details of Hx, PE.  Briefly patient is a 64 y.o. male who presented who presented for exertional chest pain.  History of cocaine use.  Denied any use in several days.  EKG with possible diffuse ST segment elevation concerning for pericarditis.  Prior provider spoke with [facility] cardiology who recommended admission and requested additional labs which were ordered.  Cardiology to see in the morning.  Medicine admission.      Fatima Blank, MD 10/07/20 [phone]

## 2020-10-07 NOTE — Discharge Summary (Addendum)
Name: Robert Chan MRN: [id number] DOB: 1956-05-14 64 y.o. PCP: Elsie Stain, MD  Date of Admission: 10/06/2020  9:47 AM Date of Discharge:  10/07/2020 Attending Physician: Dr. Angelia Mould  Discharge Diagnosis: Principal Problem:   Coronary vasospasm [facility]) Active Problems:   Benign hypertension   Cocaine abuse ([place])   Chest pain    Discharge Medications: Allergies as of 10/07/2020       Reactions   Shellfish Allergy Hives, Rash        Medication List     TAKE these medications    albuterol 108 (90 Base) MCG/ACT inhaler Commonly known as: VENTOLIN HFA Inhale 1-2 puffs into the lungs every 6 (six) hours as needed for wheezing or shortness of breath.   albuterol (2.5 MG/3ML) 0.083% nebulizer solution Commonly known as: PROVENTIL Take 3 mLs (2.5 mg total) by nebulization every 6 (six) hours as needed for wheezing or shortness of breath.   amLODipine 10 MG tablet Commonly known as: NORVASC Take 1 tablet (10 mg total) by mouth daily.   ARIPiprazole 10 MG tablet Commonly known as: ABILIFY Take 1 tablet (10 mg total) by mouth at bedtime.   aspirin EC 81 MG tablet Take 1 tablet (81 mg total) by mouth daily. Swallow whole.   atorvastatin 40 MG tablet Commonly known as: LIPITOR Take 1 tablet (40 mg total) by mouth daily.   buPROPion 300 MG 24 hr tablet Commonly known as: WELLBUTRIN XL Take 1 tablet (300 mg total) by mouth daily.   folic acid 1 MG tablet Commonly known as: FOLVITE Take 1 tablet (1 mg total) by mouth daily.   hydrOXYzine 25 MG tablet Commonly known as: ATARAX/VISTARIL Take 1 tablet (25 mg total) by mouth 3 (three) times daily as needed for anxiety.   nicotine 21 mg/24hr patch Commonly known as: NICODERM CQ - dosed in mg/24 hours Place 1 patch (21 mg total) onto the skin daily.   Symbicort 160-4.5 MCG/ACT inhaler Generic drug: budesonide-formoterol Inhale 2 puffs into the lungs in the morning and at bedtime.   thiamine 100 MG  tablet Take 1 tablet (100 mg total) by mouth daily.   traZODone 50 MG tablet Commonly known as: DESYREL Take 0.5 tablets (25 mg total) by mouth at bedtime as needed and may repeat dose one time if needed for sleep.   Vitamin D3 25 MCG tablet Commonly known as: Vitamin D Take 1 tablet (1,000 Units total) by mouth daily.        Disposition and follow-up:   Mr.Elo Torok was discharged from [facility] in Stable condition.  At the hospital follow up visit please address:  1.  Follow-up:  A. Chest pain: Patient was chest pain free at discharge. Ensure symptoms have not reoccurred.     B. Cocaine use: Chest pain was caused by cocaine use. Continue to counsel patient on staying off cocaine.    C. Adherence: Please ensure patient follows up with cardiology and taking all his medications as prescribed.   2.  Labs / imaging needed at time of follow-up: BMP, CBC  3.  Pending labs/ test needing follow-up: None  Follow-up Appointments:    Hospital Course by problem list: Coronary Vasospasm Chest pain Patient with hx of NSTEMI, CAD, HTN, and cocaine use disorder presenting with substernal chest pain/pressure with associated dyspnea with exertion for 2-3 days before admission. Work up was negative for ACS, PE, HF, pericarditis, pneumothorax. The ST elevations seen on EKG and timing of cocaine use with  onset on chest pain made coronary vasospasm 2/2 cocaine use the most likely cause of patient's chest pain. [facility] cardiology consulted and cleared patient for discharge. Patient hemodynamically stable and chest pain-free at discharge. All home meds restarted at discharge. Patient counseled on avoidance of cocaine. Instructed to follow up with PCP and cardiology   Asthma COPD Patient with history of COPD and asthma on symbicort and albuterol inhalers at home. On exam, he had diffuse wheezing bilaterally. No cough or sputum production. He was started on breo ellipta and  scheduled duonebs q4h for 3 doses. Restarted home respiratory medications at discharge.   HTN CAD Patient with history of CAD and HTN, on norvasc 10mg , lipitor 40mg , and ASA 81mg  at home. BP stable on admission and on day of discharge. Continue home medications.   CKD Stage 3a Baseline creatinine around 1.2-1.4. On admission, Cr 1.54. He is able to urinate regularly at home. Cr improved to 1.38 before discharge. Will follow up with BMP check at PCP office   Mild anemia Chronic and stable. Hgb 12.5. B12 level in August 2021 showed borderline B12 deficiency. Iron levels normal in May 2021. Will follow up with PCP    Depression Patient with history of depression and anxiety. On trazodone 25mg  qhs prn, abilify 10mg  qhs, and wellbutrin 300mg  daily for depression and on hydroxyzine 25mg  TID prn for anxiety at home. Continue home medications    Discharge Vitals:   BP 133/82   Pulse 60   Temp 97.9 F (36.6 C) (Oral)   Resp 18   Ht 6\' 4"  (1.93 m)   Wt 117.9 kg   SpO2 100%   BMI 31.65 kg/m   Pertinent Labs, Studies, and Procedures:  CBC Latest Ref Rng & Units 10/07/2020 10/06/2020 09/08/2020  WBC 4.0 - 10.5 K/uL 8.2 7.9 19.7(H)  Hemoglobin 13.0 - 17.0 g/dL 11.6(L) 12.5(L) 11.8(L)  Hematocrit 39 - 52 % 37.8(L) 39.9 36.8(L)  Platelets 150 - 400 K/uL 246 261 202    CMP Latest Ref Rng & Units 10/07/2020 10/06/2020 09/08/2020  Glucose 70 - 99 mg/dL 105(H) 125(H) 142(H)  BUN 8 - 23 mg/dL 14 16 27(H)  Creatinine 0.61 - 1.24 mg/dL 1.38(H) 1.54(H) 1.38(H)  Sodium 135 - 145 mmol/L 140 139 135  Potassium 3.5 - 5.1 mmol/L 4.1 4.0 4.3  Chloride 98 - 111 mmol/L 108 107 105  CO2 22 - 32 mmol/L 23 21(L) 22  Calcium 8.9 - 10.3 mg/dL 8.6(L) 9.2 8.6(L)  Total Protein 6.5 - 8.1 g/dL 6.5 - -  Total Bilirubin 0.3 - 1.2 mg/dL 0.6 - -  Alkaline Phos 38 - 126 U/L 48 - -  AST 15 - 41 U/L 23 - -  ALT 0 - 44 U/L 20 - -    DG Chest 2 View  Result Date: 10/06/2020 CLINICAL DATA:  Chest pain, shortness of  breath. EXAM: CHEST - 2 VIEW COMPARISON:  September 06, 2020. FINDINGS: The heart size and mediastinal contours are within normal limits. Both lungs are clear. No visible pleural effusions or pneumothorax. No acute osseous abnormality. IMPRESSION: No active cardiopulmonary disease. Electronically Signed   By: Margaretha Sheffield MD   On: 10/06/2020 10:17   VAS [place] LOWER EXTREMITY VENOUS (DVT) (ONLY [facility] & [facility])  Result Date: 10/06/2020  Lower Venous DVT Study Indications: Pain.  Risk Factors: None identified. Limitations: Poor ultrasound/tissue interface. Comparison Study: No prior studies. Performing Technologist: Oliver Hum RVT  Examination Guidelines: A complete evaluation includes B-mode imaging, spectral Doppler, color Doppler,  and power Doppler as needed of all accessible portions of each vessel. Bilateral testing is considered an integral part of a complete examination. Limited examinations for reoccurring indications may be performed as noted. The reflux portion of the exam is performed with the patient in reverse Trendelenburg.  +-----+---------------+---------+-----------+----------+--------------+ RIGHTCompressibilityPhasicitySpontaneityPropertiesThrombus Aging +-----+---------------+---------+-----------+----------+--------------+ CFV  Full           Yes      Yes                                 +-----+---------------+---------+-----------+----------+--------------+   +---------+---------------+---------+-----------+----------+--------------+ LEFT     CompressibilityPhasicitySpontaneityPropertiesThrombus Aging +---------+---------------+---------+-----------+----------+--------------+ CFV      Full           Yes      Yes                                 +---------+---------------+---------+-----------+----------+--------------+ SFJ      Full                                                        +---------+---------------+---------+-----------+----------+--------------+  FV Prox  Full                                                        +---------+---------------+---------+-----------+----------+--------------+ FV Mid   Full                                                        +---------+---------------+---------+-----------+----------+--------------+ FV DistalFull                                                        +---------+---------------+---------+-----------+----------+--------------+ PFV      Full                                                        +---------+---------------+---------+-----------+----------+--------------+ POP      Full           Yes      Yes                                 +---------+---------------+---------+-----------+----------+--------------+ PTV      Full                                                        +---------+---------------+---------+-----------+----------+--------------+ PERO  Full                                                        +---------+---------------+---------+-----------+----------+--------------+     Summary: RIGHT: - No evidence of common femoral vein obstruction.  LEFT: - There is no evidence of deep vein thrombosis in the lower extremity.  - No cystic structure found in the popliteal fossa.  *See table(s) above for measurements and observations.    Preliminary      Discharge Instructions:    Mr. Corlett,  It was a [facility] taking care [facility] at [facility].  Your chest pain was not a heart attack but rather caused by your cocaine use.  Using cocaine causes the arteries of your heart to restrict blood flow to your heart leading to chest pain like you experienced.  It is very important to completely stop using the cocaine otherwise the symptoms will return.  We also want you to follow-up in the outpatient with your primary care doctor and the cardiologist who saw you in the hospital.  Please continue taking your medications as prescribed.  1)  Call and set up an appointment with your primary care doctor. 2) Call and set up an appointment with a cardiologist 3) Stop using cocaine and take all your medications as prescribed.  Take care, Dr. Linwood Dibbles, MD, MPH  Signed: Lacinda Axon, MD 10/07/2020, 12:24 PM   Pager: [phone]

## 2020-10-07 NOTE — Discharge Instructions (Signed)
Robert Chan,  It was a pleasure taking care of you at the [facility].  Your chest pain was not a heart attack but rather caused by your cocaine use.  Using cocaine causes the arteries of your heart to restrict blood flow to your heart leading to chest pain like you experienced.  It is very important to completely stop using the cocaine otherwise the symptoms will return.  We also want you to follow-up in the outpatient with your primary care doctor and the cardiologist who saw you in the hospital.  Please continue taking your medications as prescribed.  1) Call and set up an appointment with your primary care doctor. 2) Call and set up an appointment with a cardiologist 3) Stop using cocaine and take all your medications as prescribed.  Take care, Dr. Linwood Dibbles, MD, MPH

## 2020-10-09 ENCOUNTER — Encounter (HOSPITAL_COMMUNITY): Payer: Medicare HMO | Admitting: Psychiatry

## 2020-10-23 ENCOUNTER — Ambulatory Visit: Payer: Medicare HMO | Attending: Physician Assistant | Admitting: *Deleted

## 2020-10-23 ENCOUNTER — Other Ambulatory Visit: Payer: Self-pay

## 2020-10-23 ENCOUNTER — Ambulatory Visit: Payer: Medicare HMO | Admitting: *Deleted

## 2020-10-23 DIAGNOSIS — Z23 Encounter for immunization: Secondary | ICD-10-CM

## 2020-10-23 NOTE — Progress Notes (Signed)
° °  Covid-19 Vaccination Clinic  Name:  Robert Chan    MRN: [id number] DOB: May 03, 1956  10/23/2020  [name] was observed post Covid-19 immunization for 15 minutes without incident. He was provided with Vaccine Information Sheet and instruction to access the V-Safe system.   [name] was instructed to call 911 with any severe reactions post vaccine:  Difficulty breathing   Swelling of face and throat   A fast heartbeat   A bad rash all over body   Dizziness and weakness

## 2020-10-23 NOTE — Progress Notes (Addendum)
Patient presents for vaccine injection today. Patient tolerated injection well and was observed without any concerns. **DUPLICATE ENCOUNTER** Complete documentation falls under Same date of service encounter

## 2020-11-05 ENCOUNTER — Other Ambulatory Visit: Payer: Self-pay | Admitting: Critical Care Medicine

## 2020-11-05 NOTE — Congregational Nurse Program (Signed)
  Dept: [phone]   Congregational Nurse Program Note  Date of Encounter: 11/05/2020  Past Medical History: Past Medical History:  Diagnosis Date  . Asthma   . CAD (coronary artery disease)   . Cataract   . CKD (chronic kidney disease)   . COPD (chronic obstructive pulmonary disease) (HCC)   . Depression   . Hypertension   . NSTEMI (non-ST elevated myocardial infarction) (HCC)   . Suicidal ideation 06/05/2020    Encounter Details:  CNP Questionnaire - 11/05/20 1300      Questionnaire   Do you give verbal consent to treat you today? Yes    Visit Setting Other    Location Patient Served [facility]    Patient Status Homeless    Medical Provider Yes    Insurance Medicare    Intervention Counsel;Educate;Support;Refer    Housing/Utilities No permanent housing    Transportation Within past 12 months, lack of transportation negatively impacted life    Medication Provided medication assistance (Pharmacies, drug rep, etc.)    Referrals PCP - [place];Area Agency;[facility] (Non-diabetic)    ED Visit Averted Yes          Client out of his medications ,ask if he has refills not sure . Nurse assisted him with calling pharmacy to refill medications ,can pick up this pm . Client states he is feeling  A  little anxiety about housing as it has been several weeks lots of applitions in  no  word. Gave encouragement  And be patient . Client is ready for his own place. Follow weekly To pick medications up today , encouraged to take as prescribed . Reminded of eye appointment for next week at 2 pm ,gave client address again . Reminded to take history with him that was completed several weeks ago. [place] weekly

## 2020-11-11 NOTE — Congregational Nurse Program (Signed)
  Dept: [place] Nurse Program Note  Date of Encounter: 11/05/2020  Past Medical History: Past Medical History:  Diagnosis Date  . Asthma   . CAD (coronary artery disease)   . Cataract   . CKD (chronic kidney disease)   . COPD (chronic obstructive pulmonary disease) ([place])   . Depression   . Hypertension   . NSTEMI (non-ST elevated myocardial infarction) ([place])   . Suicidal ideation 06/05/2020    Encounter Details:  CNP Questionnaire - 11/05/20 1506      Questionnaire   Do you give verbal consent to treat you today? Yes    Visit Setting Other    Location Patient Served At [facility]    Patient Status Homeless    Medical Provider Yes    Insurance Medicare    Intervention Counsel;Educate;Support    Housing/Utilities No permanent housing    Transportation Within past 12 months, lack of transportation negatively impacted life    Medication Provided medication assistance (Pharmacies, drug rep, etc.)    Referrals Area Agency;[facility] (Non-diabetic)    ED Visit Averted Yes          Nurse assisted with calling pharmacy for medication refills ,gave client a list of medications that can be refilled ,he is to pick up this pm . Has been out of some of his medications  Since November.Counseled regarding the importance of taking his meds. States he is having some anxiety about his housing situation,no one bedroom units available has put in several applications. Gave encouragement that he must continue to work on identifying places and hopefully he will find something soon . Follow weekly /student SW to return next week and work with him. Reminded of upcoming eye appointment ,address given again ,will need to take [paper work since he is a new client.

## 2020-11-14 NOTE — Telephone Encounter (Signed)
Encounter opened in error

## 2020-11-26 NOTE — Congregational Nurse Program (Signed)
  Dept: [place] Nurse Program Note  Date of Encounter: 11/25/2020  Past Medical History: Past Medical History:  Diagnosis Date  . Asthma   . CAD (coronary artery disease)   . Cataract   . CKD (chronic kidney disease)   . COPD (chronic obstructive pulmonary disease) ([place])   . Depression   . Hypertension   . NSTEMI (non-ST elevated myocardial infarction) ([place])   . Suicidal ideation 06/05/2020    Encounter Details:  CNP Questionnaire - 11/26/20 1541      Questionnaire   Do you give verbal consent to treat you today? Yes    Visit Setting Other    Location Patient Served At [facility]    Patient Status Homeless    Medical Provider Yes    Insurance Medicare    Intervention Counsel;Educate;Support;Refer    Housing/Utilities No permanent housing    Referrals PCP -     ED Visit Averted Yes         Client in states he feels tired ,cough some SOB when he walks down the hall. Client is asthmatic ,using his inhaler he states ,no  tightness in chest ,runny nose  Appetite ok , tested last week for COVID-19  test was  neg, will do another test today since he has some symptoms . States symptoms have been with him for several days . ellume COVID 19 test negative ,counseled regarding symptoms and need to seek care if symptoms worsen over night ,can if he feels no better see mobile van also on Thursday . Will follow up with client on tomorrow to check on symptoms .  No housing yet ,still working on applications and applying.

## 2020-12-03 ENCOUNTER — Encounter (HOSPITAL_COMMUNITY): Payer: Self-pay

## 2020-12-03 ENCOUNTER — Emergency Department (HOSPITAL_COMMUNITY)
Admission: EM | Admit: 2020-12-03 | Discharge: 2020-12-04 | Disposition: A | Discharge status: Home / Self Care | Payer: Medicare HMO | Attending: Emergency Medicine | Admitting: Emergency Medicine

## 2020-12-03 ENCOUNTER — Other Ambulatory Visit: Payer: Self-pay

## 2020-12-03 ENCOUNTER — Emergency Department (HOSPITAL_COMMUNITY): Payer: Medicare HMO

## 2020-12-03 DIAGNOSIS — Z79899 Other long term (current) drug therapy: Secondary | ICD-10-CM | POA: Insufficient documentation

## 2020-12-03 DIAGNOSIS — J449 Chronic obstructive pulmonary disease, unspecified: Secondary | ICD-10-CM | POA: Diagnosis not present

## 2020-12-03 DIAGNOSIS — F17210 Nicotine dependence, cigarettes, uncomplicated: Secondary | ICD-10-CM | POA: Insufficient documentation

## 2020-12-03 DIAGNOSIS — N189 Chronic kidney disease, unspecified: Secondary | ICD-10-CM | POA: Insufficient documentation

## 2020-12-03 DIAGNOSIS — Z7951 Long term (current) use of inhaled steroids: Secondary | ICD-10-CM | POA: Insufficient documentation

## 2020-12-03 DIAGNOSIS — Z7982 Long term (current) use of aspirin: Secondary | ICD-10-CM | POA: Insufficient documentation

## 2020-12-03 DIAGNOSIS — R0789 Other chest pain: Secondary | ICD-10-CM | POA: Diagnosis not present

## 2020-12-03 DIAGNOSIS — R5381 Other malaise: Secondary | ICD-10-CM | POA: Diagnosis not present

## 2020-12-03 DIAGNOSIS — R5383 Other fatigue: Secondary | ICD-10-CM | POA: Diagnosis not present

## 2020-12-03 DIAGNOSIS — I10 Essential (primary) hypertension: Secondary | ICD-10-CM | POA: Diagnosis not present

## 2020-12-03 DIAGNOSIS — R0602 Shortness of breath: Secondary | ICD-10-CM | POA: Diagnosis not present

## 2020-12-03 DIAGNOSIS — I2510 Atherosclerotic heart disease of native coronary artery without angina pectoris: Secondary | ICD-10-CM | POA: Insufficient documentation

## 2020-12-03 DIAGNOSIS — I129 Hypertensive chronic kidney disease with stage 1 through stage 4 chronic kidney disease, or unspecified chronic kidney disease: Secondary | ICD-10-CM | POA: Insufficient documentation

## 2020-12-03 DIAGNOSIS — R69 Illness, unspecified: Secondary | ICD-10-CM | POA: Diagnosis not present

## 2020-12-03 DIAGNOSIS — J45909 Unspecified asthma, uncomplicated: Secondary | ICD-10-CM | POA: Insufficient documentation

## 2020-12-03 LAB — BASIC METABOLIC PANEL
Anion gap: 12 (ref 5–15)
BUN: 13 mg/dL (ref 8–23)
CO2: 19 mmol/L — ABNORMAL LOW (ref 22–32)
Calcium: 8.7 mg/dL — ABNORMAL LOW (ref 8.9–10.3)
Chloride: 107 mmol/L (ref 98–111)
Creatinine, Ser: 1.26 mg/dL — ABNORMAL HIGH (ref 0.61–1.24)
GFR, Estimated: >60 mL/min (ref >60)
Glucose, Bld: 87 mg/dL (ref 70–99)
Potassium: 3.7 mmol/L (ref 3.5–5.1)
Sodium: 138 mmol/L (ref 135–145)

## 2020-12-03 LAB — CBC
HCT: 36.8 % — ABNORMAL LOW (ref 39.0–52.0)
Hemoglobin: 11.6 g/dL — ABNORMAL LOW (ref 13.0–17.0)
MCH: 29.4 pg (ref 26.0–34.0)
MCHC: 31.5 g/dL (ref 30.0–36.0)
MCV: 93.2 fL (ref 80.0–100.0)
Platelets: 233 10*3/uL (ref 150–400)
RBC: 3.95 MIL/uL — ABNORMAL LOW (ref 4.22–5.81)
RDW: 12.9 % (ref 11.5–15.5)
WBC: 8 10*3/uL (ref 4.0–10.5)
nRBC: 0 % (ref 0.0–0.2)

## 2020-12-03 LAB — TROPONIN I (HIGH SENSITIVITY)
Troponin I (High Sensitivity): 12 ng/L (ref <18)
Troponin I (High Sensitivity): 12 ng/L (ref <18)

## 2020-12-03 NOTE — ED Triage Notes (Signed)
Patient complains of cough and congestion, thinks related to his asthma and COPD. Using inhaler with minimal relief and states that he feels tight. NO distress

## 2020-12-04 NOTE — ED Notes (Signed)
Elevated tro acuity increased

## 2020-12-04 NOTE — Discharge Instructions (Signed)
We advise against the use of crack cocaine.  Continue your daily medications.  Your work-up in the ED today has been reassuring, without concerning cause for your symptoms.  Continue follow-up with a primary care doctor.  Return for new or concerning symptoms.

## 2020-12-04 NOTE — ED Provider Notes (Signed)
[facility] EMERGENCY DEPARTMENT Provider Note   CSN: [id number] Arrival date & time: 12/03/20  1512     History No chief complaint on file.   Tanis Malczewski is a 65 y.o. male.  65 y/o male with hx of CAD, COPD, CKD, HTN, cocaine abuse presents to the emergency department for complaints of fatigue and malaise.  He states that he has been experiencing symptoms for approximately 1 week.  References lack of energy with any degree of activity.  Describes some nonspecific, nonradiating central chest discomfort and shortness of breath, not specifically aggravated by exertion.  States that he used to workout, but does not any longer.  Has had some weight gain as a result.  Denies lower extremity edema, syncope, vomiting, fever.  Reports negative Covid test approximately 1 week ago.  Is a social smoker, drinks occasionally.  Last used crack cocaine 3 days ago.  Has been compliant with his daily medications.  The history is provided by the patient. No language interpreter was used.       Past Medical History:  Diagnosis Date  . Asthma   . CAD (coronary artery disease)   . Cataract   . CKD (chronic kidney disease)   . COPD (chronic obstructive pulmonary disease) ([place])   . Depression   . Hypertension   . NSTEMI (non-ST elevated myocardial infarction) ([place])   . Suicidal ideation 06/05/2020    Patient Active Problem List   Diagnosis Date Noted  . Chest pain 10/07/2020  . 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 Cigarette smoker   . Noncompliance   . MDD (major depressive disorder), recurrent episode, severe ([place]) 09/08/2020  . Cocaine abuse ([place])   . Vitamin D deficiency 06/23/2020  . Coronary artery disease 06/19/2020  . Homelessness 06/19/2020  . Tobacco abuse 06/05/2020  . History of non-ST elevation myocardial infarction (NSTEMI)   . Foot callus 12/12/2019  . Cocaine use disorder, mild, abuse ([place])   . Elevated serum creatinine 08/28/2019  . Moderate alcohol use  disorder ([place]) 04/01/2018  . Age-related nuclear cataract of right eye 09/13/2016  . Benign hypertension 07/06/2016  . Glaucoma 07/06/2016  . Male erectile disorder 07/06/2016  . Asthma 10/18/2012  . COPD (chronic obstructive pulmonary disease) ([place]) 10/18/2012    Past Surgical History:  Procedure Laterality Date  . APPENDECTOMY    . EYE SURGERY    . LEFT HEART CATH AND CORONARY ANGIOGRAPHY N/A 06/05/2020   Procedure: LEFT HEART CATH AND CORONARY ANGIOGRAPHY;  Surgeon: Nigel Mormon, MD;  Location: [facility];  Service: Cardiovascular;  Laterality: N/A;  . PROSTATE SURGERY         Family History  Problem Relation Age of Onset  . Hypertension Mother   . Diabetes Mother   . Hypertension Father   . Hypertension Sister     Social History   Tobacco Use  . Smoking status: Current Every Day Smoker  . Smokeless tobacco: Never Used  . Tobacco comment: smokes 3-4 cigarettes/day  Substance Use Topics  . Alcohol use: Yes    Comment: drinks 2x/wk, 2-3 40 oz drinks, last drink Friday  . Drug use: Yes    Types: Cocaine    Comment: cocaine use once a month, last use Friday     Home Medications Prior to Admission medications   Medication Sig Start Date End Date Taking? Authorizing Provider  albuterol (PROVENTIL) (2.5 MG/3ML) 0.083% nebulizer solution Take 3 mLs (2.5 mg total) by nebulization every 6 (six) hours  as needed for wheezing or shortness of breath. 09/29/20   Elsie Stain, MD  albuterol (VENTOLIN HFA) 108 (90 Base) MCG/ACT inhaler Inhale 1-2 puffs into the lungs every 6 (six) hours as needed for wheezing or shortness of breath. 06/19/20   Elsie Stain, MD  amLODipine (NORVASC) 10 MG tablet Take 1 tablet (10 mg total) by mouth daily. 09/29/20   Elsie Stain, MD  ARIPiprazole (ABILIFY) 10 MG tablet Take 1 tablet (10 mg total) by mouth at bedtime. 09/29/20   Elsie Stain, MD  aspirin EC 81 MG tablet Take 1 tablet (81 mg total) by mouth daily.  Swallow whole. 09/29/20   Elsie Stain, MD  atorvastatin (LIPITOR) 40 MG tablet Take 1 tablet (40 mg total) by mouth daily. 09/29/20   Elsie Stain, MD  buPROPion (WELLBUTRIN XL) 300 MG 24 hr tablet Take 1 tablet (300 mg total) by mouth daily. 09/16/20   Niel Hummer, NP  folic acid (FOLVITE) 1 MG tablet TAKE 1 TABLET BY MOUTH EVERY DAY 11/06/20   Elsie Stain, MD  hydrOXYzine (ATARAX/VISTARIL) 25 MG tablet Take 1 tablet (25 mg total) by mouth 3 (three) times daily as needed for anxiety. 09/29/20   Elsie Stain, MD  nicotine (NICODERM CQ - DOSED IN MG/24 HOURS) 21 mg/24hr patch Place 1 patch (21 mg total) onto the skin daily. Patient not taking: Reported on 10/07/2020 09/29/20   Elsie Stain, MD  SYMBICORT 160-4.5 MCG/ACT inhaler Inhale 2 puffs into the lungs in the morning and at bedtime. 09/29/20   Elsie Stain, MD  thiamine 100 MG tablet Take 1 tablet (100 mg total) by mouth daily. 09/29/20   Elsie Stain, MD  traZODone (DESYREL) 50 MG tablet Take 0.5 tablets (25 mg total) by mouth at bedtime as needed and may repeat dose one time if needed for sleep. 09/29/20   Elsie Stain, MD  Vitamin D3 (VITAMIN D) 25 MCG tablet Take 1 tablet (1,000 Units total) by mouth daily. 09/16/20   Niel Hummer, NP    Allergies    Shellfish allergy  Review of Systems   Review of Systems  Ten systems reviewed and are negative for acute change, except as noted in the HPI.    Physical Exam Updated Vital Signs BP 118/85   Pulse 73   Temp 98.3 F (36.8 C) (Oral)   Resp 19   SpO2 98%   Physical Exam Vitals and nursing note reviewed.  Constitutional:      General: He is not in acute distress.    Appearance: He is well-developed and well-nourished. He is not diaphoretic.     Comments: Nontoxic appearing AA male.  HENT:     Head: Normocephalic and atraumatic.  Eyes:     General: No scleral icterus.    Extraocular Movements: EOM normal.     Conjunctiva/sclera:  Conjunctivae normal.  Cardiovascular:     Rate and Rhythm: Normal rate and regular rhythm.     Pulses: Normal pulses.  Pulmonary:     Effort: Pulmonary effort is normal. No respiratory distress.     Breath sounds: No stridor. No wheezing or rales.     Comments: Respirations even and unlabored Musculoskeletal:        General: Normal range of motion.     Cervical back: Normal range of motion.     Comments: No BLE edema.  Skin:    General: Skin is warm and dry.  Coloration: Skin is not pale.     Findings: No erythema or rash.  Neurological:     Mental Status: He is alert and oriented to person, place, and time.     Coordination: Coordination normal.  Psychiatric:        Mood and Affect: Mood and affect normal.        Behavior: Behavior normal.     ED Results / Procedures / Treatments   Labs (all labs ordered are listed, but only abnormal results are displayed) Labs Reviewed  BASIC METABOLIC PANEL - Abnormal; Notable for the following components:      Result Value   CO2 19 (*)    Creatinine, Ser 1.26 (*)    Calcium 8.7 (*)    All other components within normal limits  CBC - Abnormal; Notable for the following components:   RBC 3.95 (*)    Hemoglobin 11.6 (*)    HCT 36.8 (*)    All other components within normal limits  TROPONIN I (HIGH SENSITIVITY)  TROPONIN I (HIGH SENSITIVITY)    EKG EKG Interpretation  Date/Time:  Wednesday December 03 2020 15:31:51 EST Ventricular Rate:  76 PR Interval:  206 QRS Duration: 82 QT Interval:  368 QTC Calculation: 414 R Axis:   131 Text Interpretation: Normal sinus rhythm Right axis deviation Possible Anterior infarct , age undetermined Abnormal ECG Confirmed by Gerlene Fee [phone]) on 12/04/2020 2:50:51 AM   Radiology DG Chest 2 View  Result Date: 12/03/2020 CLINICAL DATA:  Chest tightness and shortness of breath. EXAM: CHEST - 2 VIEW COMPARISON:  10/06/2020 FINDINGS: Normal heart size. There is no pleural effusion or edema  identified. No airspace densities identified. Review of the visualized osseous structures is unremarkable. IMPRESSION: No active cardiopulmonary abnormalities. Electronically Signed   By: Kerby Moors M.D.   On: 12/03/2020 15:45    Left Heart Cath 06/2020 LM: Normal LAD: Prox 30% stenosis. Mid LAD myocardial bridge        Slow flow improved with IC NTG LCx: Normal RCA: Mid 20% disease Impression: Mild nonobstructive CAD Chest pain and trop elevation due to cocaine induced vasospasm  Echocardiogram 09/2020 IMPRESSIONS  1. Left ventricular ejection fraction, by estimation, is 60 to 65%. The  left ventricle has normal function. The left ventricle has no regional  wall motion abnormalities. Left ventricular diastolic parameters are  consistent with Grade I diastolic  dysfunction (impaired relaxation).  2. Right ventricular systolic function is normal. The right ventricular  size is normal.  3. The mitral valve is normal in structure. No evidence of mitral valve  regurgitation. No evidence of mitral stenosis.  4. The aortic valve is normal in structure. Aortic valve regurgitation is  not visualized. No aortic stenosis is present.    Procedures Procedures     Medications Ordered in ED Medications - No data to display  ED Course  I have reviewed the triage vital signs and the nursing notes.  Pertinent labs & imaging results that were available during my care of the patient were reviewed by me and considered in my medical decision making (see chart for details).    MDM Rules/Calculators/A&P                          65 year old male presenting for 1 week of fatigue and malaise.  Triage note references chest tightness, though patient has no specific complaints of this at the present time.  On review, patient is afebrile with  stable vital signs.  His work-up is without leukocytosis.  Anemia stable.  CXR without acute cardiopulmonary abnormality; no PTX, PNA, mediastinal  widening, effusion.  He has a negative troponin x2 today with hx of left heart catheterization in August 2021.  This showed nonobstructive CAD with chest pain being related to cocaine induced vasospasm.  He reports last using cocaine 3 days ago.   Symptoms may be related to viral etiology versus general deconditioning.  He does state he tested negative for COVID 1 week ago.  Do not feel further emergent work-up is indicated at this time.  Have advised he follow-up with his primary care doctor for ongoing complaints.  Return precautions discussed and provided. Patient discharged in stable condition with no unaddressed concerns.   Final Clinical Impression(s) / ED Diagnoses Final diagnoses:  Malaise and fatigue    Rx / DC Orders ED Discharge Orders    None       Antonietta Breach, PA-C 12/04/20 0323    Maudie Flakes, MD 12/04/20 [phone]

## 2020-12-25 ENCOUNTER — Ambulatory Visit: Payer: Medicare HMO | Admitting: Critical Care Medicine

## 2020-12-25 ENCOUNTER — Other Ambulatory Visit: Payer: Self-pay

## 2020-12-25 VITALS — BP 145/81 | HR 60 | Temp 98.2°F | Resp 18 | Ht 75.0 in | Wt 282.0 lb

## 2020-12-25 DIAGNOSIS — Z720 Tobacco use: Secondary | ICD-10-CM | POA: Diagnosis not present

## 2020-12-25 DIAGNOSIS — Z5900 Homelessness unspecified: Secondary | ICD-10-CM | POA: Diagnosis not present

## 2020-12-25 DIAGNOSIS — I208 Other forms of angina pectoris: Secondary | ICD-10-CM

## 2020-12-25 DIAGNOSIS — R69 Illness, unspecified: Secondary | ICD-10-CM | POA: Diagnosis not present

## 2020-12-25 DIAGNOSIS — I739 Peripheral vascular disease, unspecified: Secondary | ICD-10-CM

## 2020-12-25 DIAGNOSIS — I10 Essential (primary) hypertension: Secondary | ICD-10-CM

## 2020-12-25 DIAGNOSIS — J410 Simple chronic bronchitis: Secondary | ICD-10-CM | POA: Diagnosis not present

## 2020-12-25 DIAGNOSIS — F1410 Cocaine abuse, uncomplicated: Secondary | ICD-10-CM

## 2020-12-25 DIAGNOSIS — F332 Major depressive disorder, recurrent severe without psychotic features: Secondary | ICD-10-CM

## 2020-12-25 DIAGNOSIS — I25118 Atherosclerotic heart disease of native coronary artery with other forms of angina pectoris: Secondary | ICD-10-CM | POA: Diagnosis not present

## 2020-12-25 MED ORDER — AMLODIPINE BESYLATE 10 MG PO TABS: 10.0000 mg | ORAL_TABLET | Freq: Every day | ORAL | 1 refills | Status: DC

## 2020-12-25 MED ORDER — ARIPIPRAZOLE 10 MG PO TABS: 10.0000 mg | ORAL_TABLET | Freq: Every day | ORAL | 3 refills | Status: DC

## 2020-12-25 MED ORDER — VALSARTAN-HYDROCHLOROTHIAZIDE 160-25 MG PO TABS: 1.0000 | ORAL_TABLET | Freq: Every day | ORAL | 1 refills | Status: DC

## 2020-12-25 MED ORDER — FUROSEMIDE 20 MG PO TABS: 20.0000 mg | ORAL_TABLET | Freq: Every day | ORAL | 2 refills | Status: DC

## 2020-12-25 MED ORDER — ATORVASTATIN CALCIUM 40 MG PO TABS: 40.0000 mg | ORAL_TABLET | Freq: Every day | ORAL | 1 refills | Status: DC

## 2020-12-25 MED ORDER — TRAZODONE HCL 50 MG PO TABS: 25.0000 mg | ORAL_TABLET | Freq: Every evening | ORAL | 0 refills | Status: DC | PRN

## 2020-12-25 MED ORDER — HYDROXYZINE HCL 25 MG PO TABS: 25.0000 mg | ORAL_TABLET | Freq: Three times a day (TID) | ORAL | 1 refills | Status: DC | PRN

## 2020-12-25 MED ORDER — VITAMIN D (ERGOCALCIFEROL) 1.25 MG (50000 UNIT) PO CAPS
50000.0000 [IU] | ORAL_CAPSULE | ORAL | 1 refills | Status: DC
Start: 2020-12-25 — End: 2021-04-28

## 2020-12-25 NOTE — Assessment & Plan Note (Signed)
Refill Abilify will need to connect to mental health services

## 2020-12-25 NOTE — Assessment & Plan Note (Signed)
Ongoing abuse of cocaine will need further drug treatment

## 2020-12-25 NOTE — Assessment & Plan Note (Signed)
Patient now living with daughter

## 2020-12-25 NOTE — Assessment & Plan Note (Signed)
  .   Current smoking consumption amount: 1 pack every few days  . Dicsussion on advise to quit smoking and smoking impacts: Cardiovascular lung impacts  . Patient's willingness to quit: Not yet willing to quit  . Methods to quit smoking discussed: Behavioral modification  . Medication management of smoking session drugs discussed: Not indicated, had been on bupropion is having adverse reactions we will discontinue this  . Resources provided:  AVS   . Setting quit date not established  . Follow-up arranged 1 month   Time spent counseling the patient:

## 2020-12-25 NOTE — Assessment & Plan Note (Signed)
Continue inhaled medications as prescribed reinstructed as to proper use of HFA inhaler

## 2020-12-25 NOTE — Patient Instructions (Addendum)
A referral to cardiology will be made for your chest pain  Referral to vascular surgery we made for your lower extremity pain and swelling I am concerned there may be an arterial blockage may need to have an arterial ultrasound done which they will do at their clinic  [facility]  Begin valsartan HCT 1 daily for blood pressure Continue amlodipine daily Stay on atorvastatin daily Stay on Abilify at bedtime and use trazodone to help with sleep Use hydroxyzine as needed for anxiety Stay on the Symbicort inhaler Begin furosemide 1 tablet daily to help remove fluid All medications sent to your [facility] Please remember to stay on one aspirin a day  Labs today include complete metabolic panel blood count troponin and lipid panel  Please discontinue the bupropion and Remeron if you have been taking that  When you come to see Dr. Joya Gaskins please bring all your medications you are taking and even though as you have been taking in a bag so we can do a medication reconciliation in the office
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
## 2020-12-25 NOTE — Assessment & Plan Note (Signed)
History of coronary artery disease left anterior descending artery right coronary concerned he is having ongoing ischemia  This appears to be stable so will refer as an outpatient to cardiology and obtain troponin  Resume aspirin 81 mg daily

## 2020-12-25 NOTE — Progress Notes (Signed)
Patient has not eaten nor taken medication today. Patient complains of right foot swelling with a tight discomfort. Patient denies any injury to the area.

## 2020-12-25 NOTE — Assessment & Plan Note (Signed)
Physical findings compatible with peripheral artery disease  Referral to vascular surgery for ultrasound testing

## 2020-12-25 NOTE — Assessment & Plan Note (Signed)
Modest control of blood pressure Begin valsartan HCT and continue amlodipine

## 2020-12-25 NOTE — Assessment & Plan Note (Signed)
As per coronary artery disease assessment 

## 2021-01-01 ENCOUNTER — Other Ambulatory Visit: Payer: Medicare HMO

## 2021-01-01 ENCOUNTER — Other Ambulatory Visit: Payer: Self-pay

## 2021-01-01 NOTE — Progress Notes (Signed)
Patient tolerated blood draw in the right hand well today. Patient is aware of receiving results via telephone Monday.

## 2021-01-02 LAB — TROPONIN T: Troponin T (Highly Sensitive): 22 ng/L (ref 0–22)

## 2021-01-02 LAB — COMPREHENSIVE METABOLIC PANEL
ALT: 18 IU/L (ref 0–44)
AST: 22 IU/L (ref 0–40)
Albumin/Globulin Ratio: 1.5 (ref 1.2–2.2)
Albumin: 4.5 g/dL (ref 3.8–4.8)
Alkaline Phosphatase: 76 IU/L (ref 44–121)
BUN/Creatinine Ratio: 8 — ABNORMAL LOW (ref 10–24)
BUN: 15 mg/dL (ref 8–27)
Bilirubin Total: 0.5 mg/dL (ref 0.0–1.2)
CO2: 19 mmol/L — ABNORMAL LOW (ref 20–29)
Calcium: 9.3 mg/dL (ref 8.6–10.2)
Chloride: 107 mmol/L — ABNORMAL HIGH (ref 96–106)
Creatinine, Ser: 1.81 mg/dL — ABNORMAL HIGH (ref 0.76–1.27)
Globulin, Total: 3.1 g/dL (ref 1.5–4.5)
Glucose: 123 mg/dL — ABNORMAL HIGH (ref 65–99)
Potassium: 4 mmol/L (ref 3.5–5.2)
Sodium: 140 mmol/L (ref 134–144)
Total Protein: 7.6 g/dL (ref 6.0–8.5)
eGFR: 41 mL/min/{1.73_m2} — ABNORMAL LOW (ref >59)

## 2021-01-02 LAB — LIPID PANEL
Chol/HDL Ratio: 2.1 ratio (ref 0.0–5.0)
Cholesterol, Total: 131 mg/dL (ref 100–199)
HDL: 62 mg/dL (ref >39)
LDL Chol Calc (NIH): 55 mg/dL (ref 0–99)
Triglycerides: 71 mg/dL (ref 0–149)
VLDL Cholesterol Cal: 14 mg/dL (ref 5–40)

## 2021-01-05 ENCOUNTER — Other Ambulatory Visit: Payer: Self-pay

## 2021-01-05 ENCOUNTER — Encounter: Payer: Self-pay | Admitting: Cardiology

## 2021-01-05 ENCOUNTER — Ambulatory Visit (INDEPENDENT_AMBULATORY_CARE_PROVIDER_SITE_OTHER): Payer: Medicare HMO | Admitting: Cardiology

## 2021-01-05 VITALS — BP 92/60 | HR 72 | Ht 76.0 in | Wt 267.0 lb

## 2021-01-05 DIAGNOSIS — R072 Precordial pain: Secondary | ICD-10-CM | POA: Diagnosis not present

## 2021-01-05 DIAGNOSIS — R600 Localized edema: Secondary | ICD-10-CM

## 2021-01-05 DIAGNOSIS — E785 Hyperlipidemia, unspecified: Secondary | ICD-10-CM

## 2021-01-05 DIAGNOSIS — I952 Hypotension due to drugs: Secondary | ICD-10-CM

## 2021-01-05 DIAGNOSIS — I2510 Atherosclerotic heart disease of native coronary artery without angina pectoris: Secondary | ICD-10-CM

## 2021-01-05 DIAGNOSIS — R42 Dizziness and giddiness: Secondary | ICD-10-CM | POA: Diagnosis not present

## 2021-01-05 DIAGNOSIS — I10 Essential (primary) hypertension: Secondary | ICD-10-CM

## 2021-01-05 MED ORDER — ATORVASTATIN CALCIUM 80 MG PO TABS: 80.0000 mg | ORAL_TABLET | Freq: Every day | ORAL | 3 refills | Status: DC

## 2021-01-05 MED ORDER — DILTIAZEM HCL ER COATED BEADS 240 MG PO CP24: 240.0000 mg | ORAL_CAPSULE | Freq: Every day | ORAL | 3 refills | Status: DC

## 2021-01-05 NOTE — Patient Instructions (Addendum)
Medication Instructions:  Your physician has recommended you make the following change in your medication:  1) STOP taking Norvasc (amlodipine) 2) STOP taking Diovan-HCTZ (valsartan-hydrochlorothiazide) 3) START taking Cardizem (diltiazem) 240 mg daily  4) INCREASE Lipitor (atorvastatin) to 80 mg daily   *If you need a refill on your cardiac medications before your next appointment, please call your pharmacy*  Lab Work: FASTING lipids and ALT in 6 weeks  Testing/Procedures: Your physician has requested that you have a lexiscan myoview. For further information please visit [email or web address]. Please follow instruction sheet, as given.  Follow-Up: At [facility], you and your health needs are our priority.  As part of our continuing mission to provide you with exceptional heart care, we have created designated Provider Care Teams.  These Care Teams include your primary Cardiologist (physician) and Advanced Practice Providers (APPs -  Physician Assistants and Nurse Practitioners) who all work together to provide you with the care you need, when you need it.  Your next appointment:   2 week(s)  The format for your next appointment:   In Person  Provider:   You may see [name], MD or one of the following Advanced Practice Providers on your designated Care Team:    Melina Copa, PA-C  Ermalinda Barrios, PA-C   Other Instructions  Low-Sodium Eating Plan Sodium, which is an element that makes up salt, helps you maintain a healthy balance of fluids in your body. Too much sodium can increase your blood pressure and cause fluid and waste to be held in your body. Your health care provider or dietitian may recommend following this plan if you have high blood pressure (hypertension), kidney disease, liver disease, or heart failure. Eating less sodium can help lower your blood pressure, reduce swelling, and protect your heart, liver, and kidneys. What are tips for following this  plan? Reading food labels  The Nutrition Facts label lists the amount of sodium in one serving of the food. If you eat more than one serving, you must multiply the listed amount of sodium by the number of servings.  Choose foods with less than 140 mg of sodium per serving.  Avoid foods with 300 mg of sodium or more per serving. Shopping  Look for lower-sodium products, often labeled as "low-sodium" or "no salt added."  Always check the sodium content, even if foods are labeled as "unsalted" or "no salt added."  Buy fresh foods. ? Avoid canned foods and pre-made or frozen meals. ? Avoid canned, cured, or processed meats.  Buy breads that have less than 80 mg of sodium per slice.   Cooking  Eat more home-cooked food and less restaurant, buffet, and fast food.  Avoid adding salt when cooking. Use salt-free seasonings or herbs instead of table salt or sea salt. Check with your health care provider or pharmacist before using salt substitutes.  Cook with plant-based oils, such as canola, sunflower, or olive oil.   Meal planning  When eating at a restaurant, ask that your food be prepared with less salt or no salt, if possible. Avoid dishes labeled as brined, pickled, cured, smoked, or made with soy sauce, miso, or teriyaki sauce.  Avoid foods that contain MSG (monosodium glutamate). MSG is sometimes added to [place] food, bouillon, and some canned foods.  Make meals that can be grilled, baked, poached, roasted, or steamed. These are generally made with less sodium. General information Most people on this plan should limit their sodium intake to 1,500-2,000 mg (milligrams) of sodium each  day. What foods should I eat? Fruits Fresh, frozen, or canned fruit. Fruit juice. Vegetables Fresh or frozen vegetables. "No salt added" canned vegetables. "No salt added" tomato sauce and paste. Low-sodium or reduced-sodium tomato and vegetable juice. Grains Low-sodium cereals, including oats,  puffed wheat and rice, and shredded wheat. Low-sodium crackers. Unsalted rice. Unsalted pasta. Low-sodium bread. Whole-grain breads and whole-grain pasta. Meats and other proteins Fresh or frozen (no salt added) meat, poultry, seafood, and fish. Low-sodium canned tuna and salmon. Unsalted nuts. Dried peas, beans, and lentils without added salt. Unsalted canned beans. Eggs. Unsalted nut butters. Dairy Milk. Soy milk. Cheese that is naturally low in sodium, such as ricotta cheese, fresh mozzarella, or Swiss cheese. Low-sodium or reduced-sodium cheese. Cream cheese. Yogurt. Seasonings and condiments Fresh and dried herbs and spices. Salt-free seasonings. Low-sodium mustard and ketchup. Sodium-free salad dressing. Sodium-free light mayonnaise. Fresh or refrigerated horseradish. Lemon juice. Vinegar. Other foods Homemade, reduced-sodium, or low-sodium soups. Unsalted popcorn and pretzels. Low-salt or salt-free chips. The items listed above may not be a complete list of foods and beverages you can eat. Contact a dietitian for more information. What foods should I avoid? Vegetables Sauerkraut, pickled vegetables, and relishes. Olives. [place] fries. Onion rings. Regular canned vegetables (not low-sodium or reduced-sodium). Regular canned tomato sauce and paste (not low-sodium or reduced-sodium). Regular tomato and vegetable juice (not low-sodium or reduced-sodium). Frozen vegetables in sauces. Grains Instant hot cereals. Bread stuffing, pancake, and biscuit mixes. Croutons. Seasoned rice or pasta mixes. Noodle soup cups. Boxed or frozen macaroni and cheese. Regular salted crackers. Self-rising flour. Meats and other proteins Meat or fish that is salted, canned, smoked, spiced, or pickled. Precooked or cured meat, such as sausages or meat loaves. [name]. Ham. Pepperoni. Hot dogs. Corned beef. Chipped beef. Salt pork. Jerky. Pickled herring. Anchovies and sardines. Regular canned tuna. Salted  nuts. Dairy Processed cheese and cheese spreads. Hard cheeses. Cheese curds. Blue cheese. Feta cheese. String cheese. Regular cottage cheese. Buttermilk. Canned milk. Fats and oils Salted butter. Regular margarine. Ghee. Bacon fat. Seasonings and condiments Onion salt, garlic salt, seasoned salt, table salt, and sea salt. Canned and packaged gravies. Worcestershire sauce. Tartar sauce. Barbecue sauce. Teriyaki sauce. Soy sauce, including reduced-sodium. Steak sauce. Fish sauce. Oyster sauce. Cocktail sauce. Horseradish that you find on the shelf. Regular ketchup and mustard. Meat flavorings and tenderizers. Bouillon cubes. Hot sauce. Pre-made or packaged marinades. Pre-made or packaged taco seasonings. Relishes. Regular salad dressings. Salsa. Other foods Salted popcorn and pretzels. Corn chips and puffs. Potato and tortilla chips. Canned or dried soups. Pizza. Frozen entrees and pot pies. The items listed above may not be a complete list of foods and beverages you should avoid. Contact a dietitian for more information. Summary  Eating less sodium can help lower your blood pressure, reduce swelling, and protect your heart, liver, and kidneys.  Most people on this plan should limit their sodium intake to 1,500-2,000 mg (milligrams) of sodium each day.  Canned, boxed, and frozen foods are high in sodium. Restaurant foods, fast foods, and pizza are also very high in sodium. You also get sodium by adding salt to food.  Try to cook at home, eat more fresh fruits and vegetables, and eat less fast food and canned, processed, or prepared foods. This information is not intended to replace advice given to you by your health care provider. Make sure you discuss any questions you have with your health care provider. Document Revised: 11/23/2019 Document Reviewed: 09/19/2019 Elsevier Patient Education  [employer].

## 2021-01-05 NOTE — Progress Notes (Signed)
Cardiology Consult Note    Date:  01/05/2021   ID:  Robert Chan, DOB 1956-10-12, MRN [id number]  PCP:  Elsie Stain, MD  Cardiologist:  [name], MD   Chief Complaint  Patient presents with  . New Patient (Initial Visit)    CAD, HTN, HLD    History of Present Illness:  Robert Chan is a 65 y.o. male who is being seen today for the evaluation of chronic stable angina at the request of Elsie Stain, MD.  Robert Chan is a 65yo AAM with a hx of ASCAD, CKD, COPD, depression and HTN.  He presented in August 2021 with NSTEMI and underwent cardiac cath showing 30% prox LAD and mid LAD myocardial bridge and 20% mid RCA with normal LVF and normal LVEDP.  2D echo showed normal LVF with G1DD and no valvular heart disease in Nov 2021.  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   He is now here to establish cardiac care.  He has chronic DOE related to COPD and asthma.  He recently has been having problems with chest pressure a few times a week and usually lasts a few min to all day and he says it feels like pressure midsternal with no radiation and no associated sx of nausea or diaphoresis.  His main complaint is that now he is having problems with LE edema.  He admits to using a salt shaker and eats a lot of salty food including bacon, sausage, chips.  He denies any PND, orthopnea, palpitations or syncope. Recently over the past few days he has felt dizzy with low energy.  He is compliant with his meds and is tolerating meds with no SE.    Past Medical History:  Diagnosis Date  . Asthma   . CAD (coronary artery disease)   . Cataract   . CKD (chronic kidney disease)   . COPD (chronic obstructive pulmonary disease) ([place])   . Depression   . Hypertension   . NSTEMI (non-ST elevated myocardial infarction) ([place])   . Suicidal ideation 06/05/2020    Past Surgical History:  Procedure Laterality Date  . APPENDECTOMY    . EYE SURGERY    .  LEFT HEART CATH AND CORONARY ANGIOGRAPHY N/A 06/05/2020   Procedure: LEFT HEART CATH AND CORONARY ANGIOGRAPHY;  Surgeon: Nigel Mormon, MD;  Location: [facility];  Service: Cardiovascular;  Laterality: N/A;  . PROSTATE SURGERY      Current Medications: Current Meds  Medication Sig  . albuterol (PROVENTIL) (2.5 MG/3ML) 0.083% nebulizer solution Take 3 mLs (2.5 mg total) by nebulization every 6 (six) hours as needed for wheezing or shortness of breath.  Marland Kitchen albuterol (VENTOLIN HFA) 108 (90 Base) MCG/ACT inhaler Inhale 1-2 puffs into the lungs every 6 (six) hours as needed for wheezing or shortness of breath.  Marland Kitchen amLODipine (NORVASC) 10 MG tablet Take 1 tablet (10 mg total) by mouth daily.  . ARIPiprazole (ABILIFY) 10 MG tablet Take 1 tablet (10 mg total) by mouth at bedtime.  Marland Kitchen aspirin 81 MG EC tablet Take 1 tablet by mouth daily.  Marland Kitchen atorvastatin (LIPITOR) 40 MG tablet Take 1 tablet (40 mg total) by mouth daily.  . furosemide (LASIX) 20 MG tablet Take 1 tablet (20 mg total) by mouth daily.  . hydrOXYzine (ATARAX/VISTARIL) 25 MG tablet Take 1 tablet (25 mg total) by mouth 3 (three) times daily as needed for anxiety.  . SYMBICORT 160-4.5  MCG/ACT inhaler Inhale 2 puffs into the lungs in the morning and at bedtime.  . traZODone (DESYREL) 50 MG tablet Take 0.5 tablets (25 mg total) by mouth at bedtime as needed for sleep.  . valsartan-hydrochlorothiazide (DIOVAN HCT) 160-25 MG tablet Take 1 tablet by mouth daily.  . Vitamin D, Ergocalciferol, (DRISDOL) 1.25 MG (50000 UNIT) CAPS capsule Take 1 capsule (50,000 Units total) by mouth once a week.    Allergies:   Shellfish allergy   Social History   Socioeconomic History  . Marital status: Single    Spouse name: Not on file  . Number of children: Not on file  . Years of education: Not on file  . Highest education level: Not on file  Occupational History  . Not on file  Tobacco Use  . Smoking status: Current Every Day Smoker  .  Smokeless tobacco: Never Used  . Tobacco comment: smokes 3-4 cigarettes/day  Substance and Sexual Activity  . Alcohol use: Yes    Comment: drinks 2x/wk, 2-3 40 oz drinks, last drink Friday  . Drug use: Yes    Types: Cocaine    Comment: cocaine use once a month, last use Friday   . Sexual activity: Not Currently  Other Topics Concern  . Not on file  Social History Narrative  . Not on file   Social Determinants of Health   Financial Resource Strain: Not on file  Food Insecurity: Not on file  Transportation Needs: Not on file  Physical Activity: Not on file  Stress: Not on file  Social Connections: Not on file     Family History:  The patient's family history includes Diabetes in his mother; Hypertension in his father, mother, and sister.   ROS:   Please see the history of present illness.    ROS All other systems reviewed and are negative.  No flowsheet data found.     PHYSICAL EXAM:   VS:  BP 92/60   Pulse 72   Ht 6\' 4"  (1.93 m)   Wt 267 lb (121.1 kg)   SpO2 92%   BMI 32.50 kg/m    GEN: Well nourished, well developed, in no acute distress  HEENT: normal  Neck: no JVD, carotid bruits, or masses Cardiac: RRR; no murmurs, rubs, or gallops,no edema.  Intact distal pulses bilaterally.  Respiratory:  clear to auscultation bilaterally, normal work of breathing GI: soft, nontender, nondistended, + BS MS: no deformity or atrophy  Skin: warm and dry, no rash Neuro:  Alert and Oriented x 3, Strength and sensation are intact Psych: euthymic mood, full affect  Wt Readings from Last 3 Encounters:  01/05/21 267 lb (121.1 kg)  12/25/20 282 lb (127.9 kg)  11/26/20 280 lb (127 kg)      Studies/Labs Reviewed:   EKG:  EKG is not ordered today.   Cardiac Cath 06/2020 Coronary Findings   Diagnostic Dominance: Right  Left Main  Vessel is normal in caliber.  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  Left Circumflex   Vessel is normal in caliber. Vessel is angiographically normal.  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   Intervention   No interventions have been documented.  2D echo 09/2020 IMPRESSIONS   1. Left ventricular ejection fraction, by estimation, is 60 to 65%. The  left ventricle has normal function. The left ventricle has no regional  wall motion abnormalities. Left ventricular diastolic parameters are  consistent with Grade  I diastolic  dysfunction (impaired relaxation).  2. Right ventricular systolic function is normal. The right ventricular  size is normal.  3. The mitral valve is normal in structure. No evidence of mitral valve  regurgitation. No evidence of mitral stenosis.  4. The aortic valve is normal in structure. Aortic valve regurgitation is  not visualized. No aortic stenosis is present.   Recent Labs: 06/05/2020: Magnesium 2.1 09/09/2020: TSH 2.640 10/06/2020: B Natriuretic Peptide 48.5 12/03/2020: Hemoglobin 11.6; Platelets 233 12/25/2020: ALT 18; BUN 15; Creatinine, Ser 1.81; Potassium 4.0; Sodium 140   Lipid Panel    Component Value Date/Time   CHOL 131 12/25/2020 1102   TRIG 71 12/25/2020 1102   HDL 62 12/25/2020 1102   CHOLHDL 2.1 12/25/2020 1102   CHOLHDL 2.4 09/09/2020 0654   VLDL 15 09/09/2020 0654   LDLCALC 55 12/25/2020 1102    Additional studies/ records that were reviewed today include:  none  ASSESSMENT:    1. Coronary artery disease involving native coronary artery of native heart without angina pectoris   2. Hyperlipidemia LDL goal <70   3. Benign hypertension   4. Dizziness   5. Hypotension due to drugs   6. Leg edema, right      PLAN:  In order of problems listed above:  1. ASCAD -s/p remote NSTEMI in Aug 2021 -cardiac cath showed minimal nno obstructive CAD with 20% mid RCA and 30% prox LAD and mid LAD myocardial bridge.  -it was felt that his NSTEMI was due to demand ischemia from coronary vasospasm in the  setting of cocaine -he had been having chest tightness but it is atypical in that there is no radiation and no associated sx of nausea or diaphoresis but does come on with exertion.   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 that may be causing sx -continue ASA and statin -change Amlodipine to Cardizem CD 240mg  daily to help reduce myocardial bridging -options are limited for treatment of coronary vasospasm due to hx of cocaine abuse as well as soft BP -I will get a Lexiscan myoview to rule out ischemia -Shared Decision Making/Informed Consent The risks [chest pain, shortness of breath, cardiac arrhythmias, dizziness, blood pressure fluctuations, myocardial infarction, stroke/transient ischemic attack, nausea, vomiting, allergic reaction, radiation exposure, metallic taste sensation and life-threatening complications (estimated to be 1 in 10,000)], benefits (risk stratification, diagnosing coronary artery disease, treatment guidance) and alternatives of a nuclear stress test were discussed in detail with Mr. Kauffmann and he agrees to proceed.  2.  HLD -LDL goal < 70 -LDL was 86 in Nov -increase atorvastatin to 80mg  daily -repeat FLP and ALT in 6 weeks  3.  HTN -BP controlled on exam today -stop amloidipine -change to Cardizem CD 240mg  daily due to myocardial bridge -hold Valsartan HCT for now due to soft BP -SCr stable at 1.26 and K+ 2.7  4.  Dizziness/hypotension -I will stop Valsartan HCT -Followup with PA in 1 week for repeat orthostatic BPs  5.  LE edema -this is likely exacerbated but poor high Na diet that he admits to -recommended following a < 2gm Na diet and I gave him a handout today on low Na diet -continue Lasix -check 2D echo to make sure LVF has not changed  Followup with me in 2 weeks   Medication Adjustments/Labs and Tests Ordered: Current medicines are reviewed at length with the patient today.  Concerns regarding medicines are outlined above.  Medication  changes, Labs and Tests ordered today are  listed in the Patient Instructions below.  There are no Patient Instructions on file for this visit.   Signed, [name], MD  01/05/2021 2:12 PM    [facility] [place], [place], [place]  [address] Phone: [phone]; Fax: [phone]

## 2021-01-05 NOTE — Addendum Note (Signed)
Addended by: Antonieta Iba on: 01/05/2021 02:27 PM   Modules accepted: Orders

## 2021-01-06 ENCOUNTER — Telehealth (HOSPITAL_COMMUNITY): Payer: Self-pay

## 2021-01-06 NOTE — Telephone Encounter (Signed)
Spoke with the patient, detailed instructions given. He stated that he understood and would be here for his test. Asked to call back with any questions. S.Williams EMTP 

## 2021-01-07 ENCOUNTER — Other Ambulatory Visit: Payer: Self-pay

## 2021-01-07 DIAGNOSIS — R079 Chest pain, unspecified: Secondary | ICD-10-CM

## 2021-01-08 ENCOUNTER — Ambulatory Visit (HOSPITAL_COMMUNITY): Payer: Medicare HMO | Attending: Cardiology

## 2021-01-08 ENCOUNTER — Other Ambulatory Visit: Payer: Self-pay

## 2021-01-08 DIAGNOSIS — R072 Precordial pain: Secondary | ICD-10-CM

## 2021-01-08 LAB — MYOCARDIAL PERFUSION IMAGING
LV dias vol: 130 mL (ref 62–150)
LV sys vol: 65 mL
Peak HR: 83 {beats}/min
Rest HR: 66 {beats}/min
SDS: 0
SRS: 0
SSS: 0
TID: 0.97

## 2021-01-08 MED ORDER — TECHNETIUM TC 99M TETROFOSMIN IV KIT
10.1000 | PACK | Freq: Once | INTRAVENOUS | Status: AC | PRN
  Administered 2021-01-08: 10.1 via INTRAVENOUS
  Filled 2021-01-08: qty 11

## 2021-01-08 MED ORDER — TECHNETIUM TC 99M TETROFOSMIN IV KIT
30.1000 | PACK | Freq: Once | INTRAVENOUS | Status: AC | PRN
  Administered 2021-01-08: 30.1 via INTRAVENOUS
  Filled 2021-01-08: qty 31

## 2021-01-08 MED ORDER — REGADENOSON 0.4 MG/5ML IV SOLN
0.4000 mg | Freq: Once | INTRAVENOUS | Status: AC
  Administered 2021-01-08: 0.4 mg via INTRAVENOUS

## 2021-01-12 NOTE — Addendum Note (Signed)
Addended by: [name] R on: 01/12/2021 12:49 PM   Modules accepted: Orders

## 2021-01-13 ENCOUNTER — Other Ambulatory Visit: Payer: Self-pay | Admitting: Critical Care Medicine

## 2021-01-15 ENCOUNTER — Telehealth: Payer: Self-pay | Admitting: Cardiology

## 2021-01-15 DIAGNOSIS — H2511 Age-related nuclear cataract, right eye: Secondary | ICD-10-CM | POA: Diagnosis not present

## 2021-01-15 DIAGNOSIS — H25011 Cortical age-related cataract, right eye: Secondary | ICD-10-CM | POA: Diagnosis not present

## 2021-01-15 DIAGNOSIS — Z961 Presence of intraocular lens: Secondary | ICD-10-CM | POA: Diagnosis not present

## 2021-01-15 DIAGNOSIS — H401132 Primary open-angle glaucoma, bilateral, moderate stage: Secondary | ICD-10-CM | POA: Diagnosis not present

## 2021-01-15 NOTE — Telephone Encounter (Signed)
He needs followup for his orthostatic hypotension after med chan=ges

## 2021-01-15 NOTE — Telephone Encounter (Signed)
Left message for patient to call back  

## 2021-01-15 NOTE — Telephone Encounter (Signed)
Follow Up:      Pt is returning Carlyle's call from yesterday, concerning his Stress Test results.

## 2021-01-15 NOTE — Telephone Encounter (Signed)
Sueanne Margarita, MD  01/09/2021 8:30 AM EST      Please let patient know that stress test was fine   The patient has been notified of the result and verbalized understanding.  All questions (if any) were answered. Michaelyn Barter, RN 01/15/2021 1:22 PM   Patient waned to know if he still needed to keep his follow up appointment since his stress test was fine. Will forward to Dr. Radford Pax for advisement.

## 2021-01-20 ENCOUNTER — Encounter: Payer: Medicare HMO | Admitting: Cardiology

## 2021-01-20 NOTE — Progress Notes (Signed)
This encounter was created in error - please disregard.

## 2021-01-22 ENCOUNTER — Encounter: Payer: Self-pay | Admitting: Critical Care Medicine

## 2021-01-22 ENCOUNTER — Other Ambulatory Visit: Payer: Self-pay

## 2021-01-22 ENCOUNTER — Ambulatory Visit: Payer: Medicare HMO | Attending: Critical Care Medicine | Admitting: Critical Care Medicine

## 2021-01-22 DIAGNOSIS — Z5900 Homelessness unspecified: Secondary | ICD-10-CM

## 2021-01-22 DIAGNOSIS — I739 Peripheral vascular disease, unspecified: Secondary | ICD-10-CM

## 2021-01-22 DIAGNOSIS — I2510 Atherosclerotic heart disease of native coronary artery without angina pectoris: Secondary | ICD-10-CM | POA: Diagnosis not present

## 2021-01-22 DIAGNOSIS — F17210 Nicotine dependence, cigarettes, uncomplicated: Secondary | ICD-10-CM | POA: Diagnosis not present

## 2021-01-22 DIAGNOSIS — J410 Simple chronic bronchitis: Secondary | ICD-10-CM | POA: Diagnosis not present

## 2021-01-22 DIAGNOSIS — R69 Illness, unspecified: Secondary | ICD-10-CM | POA: Diagnosis not present

## 2021-01-22 DIAGNOSIS — I10 Essential (primary) hypertension: Secondary | ICD-10-CM | POA: Diagnosis not present

## 2021-01-22 DIAGNOSIS — E559 Vitamin D deficiency, unspecified: Secondary | ICD-10-CM | POA: Diagnosis not present

## 2021-01-22 DIAGNOSIS — F1421 Cocaine dependence, in remission: Secondary | ICD-10-CM

## 2021-01-22 DIAGNOSIS — R7989 Other specified abnormal findings of blood chemistry: Secondary | ICD-10-CM | POA: Diagnosis not present

## 2021-01-22 DIAGNOSIS — F332 Major depressive disorder, recurrent severe without psychotic features: Secondary | ICD-10-CM

## 2021-01-22 DIAGNOSIS — I2583 Coronary atherosclerosis due to lipid rich plaque: Secondary | ICD-10-CM

## 2021-01-22 MED ORDER — FUROSEMIDE 20 MG PO TABS: 20.0000 mg | ORAL_TABLET | Freq: Every day | ORAL | 2 refills | Status: DC
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
## 2021-01-22 NOTE — Assessment & Plan Note (Signed)
Has been off tobacco products for 1 month I congratulated him on this

## 2021-01-22 NOTE — Assessment & Plan Note (Signed)
Recent stress Myoview was negative

## 2021-01-22 NOTE — Assessment & Plan Note (Signed)
Living with daughter no longer homeless

## 2021-01-22 NOTE — Assessment & Plan Note (Signed)
Now on diltiazem off amlodipine valsartan HCT blood pressure was running too low  Keep follow-up appointments with cardiology

## 2021-01-22 NOTE — Assessment & Plan Note (Signed)
Continues to have elevations in creatinine we will monitor

## 2021-01-22 NOTE — Assessment & Plan Note (Signed)
Stable at this time continue Symbicort

## 2021-01-22 NOTE — Assessment & Plan Note (Signed)
Existing appointment upcoming in April with vascular surgery patient encouraged to keep this appointment

## 2021-01-22 NOTE — Assessment & Plan Note (Signed)
Stable on Abilify continue same

## 2021-01-22 NOTE — Assessment & Plan Note (Signed)
Not currently using cocaine 

## 2021-01-22 NOTE — Assessment & Plan Note (Signed)
Continue vitamin D supplementation 

## 2021-01-28 ENCOUNTER — Other Ambulatory Visit: Payer: Self-pay | Admitting: *Deleted

## 2021-01-28 DIAGNOSIS — I739 Peripheral vascular disease, unspecified: Secondary | ICD-10-CM

## 2021-01-30 ENCOUNTER — Encounter (INDEPENDENT_AMBULATORY_CARE_PROVIDER_SITE_OTHER): Payer: Self-pay

## 2021-02-06 ENCOUNTER — Ambulatory Visit (HOSPITAL_COMMUNITY)
Admission: RE | Admit: 2021-02-06 | Discharge: 2021-02-06 | Disposition: A | Discharge status: Home / Self Care | Payer: Medicare HMO | Source: Ambulatory Visit | Attending: Vascular Surgery | Admitting: Vascular Surgery

## 2021-02-06 ENCOUNTER — Other Ambulatory Visit: Payer: Self-pay

## 2021-02-06 ENCOUNTER — Ambulatory Visit (INDEPENDENT_AMBULATORY_CARE_PROVIDER_SITE_OTHER): Payer: Medicare HMO | Admitting: Vascular Surgery

## 2021-02-06 ENCOUNTER — Encounter: Payer: Self-pay | Admitting: Vascular Surgery

## 2021-02-06 VITALS — BP 137/85 | HR 60 | Temp 97.7°F | Resp 20 | Ht 76.0 in | Wt 270.0 lb

## 2021-02-06 DIAGNOSIS — I739 Peripheral vascular disease, unspecified: Secondary | ICD-10-CM | POA: Diagnosis not present

## 2021-02-06 DIAGNOSIS — M25561 Pain in right knee: Secondary | ICD-10-CM | POA: Diagnosis not present

## 2021-02-06 DIAGNOSIS — R600 Localized edema: Secondary | ICD-10-CM | POA: Diagnosis not present

## 2021-02-06 DIAGNOSIS — M25562 Pain in left knee: Secondary | ICD-10-CM | POA: Diagnosis not present

## 2021-02-06 NOTE — Progress Notes (Signed)
Patient ID: Robert Chan, male   DOB: 1956/01/24, 65 y.o.   MRN: [id number]  Reason for Consult: No chief complaint on file.   Referred by Robert Stain, MD  Subjective:     HPI:  Robert Chan is a 65 y.o. male history of coronary artery disease, hypertension current every day smoker.  Denies any previous history of lower extremity issues does have previous coronary angiography.  States that for a few months he has had heaviness in his legs.  He also has swelling associated.  States this is his chief limitation to walking.  He denies tissue loss or ulceration.  Denies history of stroke, denies history of personal or family history of DVT, denies personal family history of aneurysm disease.  States that swelling has not always been present.  He does not wear compression stockings.  He has never been on blood thinners.  Past Medical History:  Diagnosis Date  . Asthma   . CAD (coronary artery disease)   . Cataract   . CKD (chronic kidney disease)   . COPD (chronic obstructive pulmonary disease) ([place])   . Depression   . Hypertension   . NSTEMI (non-ST elevated myocardial infarction) ([place])   . Suicidal ideation 06/05/2020   Family History  Problem Relation Age of Onset  . Hypertension Mother   . Diabetes Mother   . Hypertension Father   . Hypertension Sister    Past Surgical History:  Procedure Laterality Date  . APPENDECTOMY    . EYE SURGERY    . LEFT HEART CATH AND CORONARY ANGIOGRAPHY N/A 06/05/2020   Procedure: LEFT HEART CATH AND CORONARY ANGIOGRAPHY;  Surgeon: Nigel Mormon, MD;  Location: [facility];  Service: Cardiovascular;  Laterality: N/A;  . PROSTATE SURGERY      Short Social History:  Social History   Tobacco Use  . Smoking status: Former Smoker    Quit date: 12/25/2020    Years since quitting: 0.1  . Smokeless tobacco: Never Used  . Tobacco comment: smokes 3-4 cigarettes/day  Substance Use Topics  . Alcohol use: Yes    Comment: drinks  2x/wk, 2-3 40 oz drinks, last drink Friday    Allergies  Allergen Reactions  . Shellfish Allergy Hives and Rash    Current Outpatient Medications  Medication Sig Dispense Refill  . albuterol (PROVENTIL) (2.5 MG/3ML) 0.083% nebulizer solution Take 3 mLs (2.5 mg total) by nebulization every 6 (six) hours as needed for wheezing or shortness of breath. 150 mL 1  . albuterol (VENTOLIN HFA) 108 (90 Base) MCG/ACT inhaler Inhale 1-2 puffs into the lungs every 6 (six) hours as needed for wheezing or shortness of breath. 90 g 2  . ARIPiprazole (ABILIFY) 10 MG tablet Take 1 tablet (10 mg total) by mouth at bedtime. 30 tablet 3  . aspirin 81 MG EC tablet SMARTSIG:1 Tablet(s) By Mouth Daily    . atorvastatin (LIPITOR) 80 MG tablet Take 1 tablet (80 mg total) by mouth daily. 90 tablet 3  . diltiazem (TIAZAC) 240 MG 24 hr capsule Take 240 mg by mouth daily.    . furosemide (LASIX) 20 MG tablet Take 1 tablet (20 mg total) by mouth daily. 40 tablet 2  . hydrOXYzine (ATARAX/VISTARIL) 25 MG tablet Take 1 tablet (25 mg total) by mouth 3 (three) times daily as needed for anxiety. 60 tablet 1  . latanoprost (XALATAN) 0.005 % ophthalmic solution Place 1 drop into both eyes nightly.    . SYMBICORT 160-4.5 MCG/ACT inhaler  Inhale 2 puffs into the lungs in the morning and at bedtime. 10.2 g 11  . traZODone (DESYREL) 50 MG tablet Take 0.5 tablets (25 mg total) by mouth at bedtime as needed for sleep. 60 tablet 0  . Vitamin D, Ergocalciferol, (DRISDOL) 1.25 MG (50000 UNIT) CAPS capsule Take 1 capsule (50,000 Units total) by mouth once a week. 12 capsule 1   No current facility-administered medications for this visit.    Review of Systems  Constitutional:  Constitutional negative. HENT: HENT negative.  Eyes: Eyes negative.  Respiratory: Respiratory negative.  Cardiovascular: Positive for leg swelling.  GI: Gastrointestinal negative.  Musculoskeletal:       Leg heaviness Skin: Skin negative.  Neurological:  Neurological negative. Hematologic: Hematologic/lymphatic negative.  Psychiatric: Psychiatric negative.        Objective:  Objective   Vitals:   02/06/21 1322  BP: 137/85  Pulse: 60  Resp: 20  Temp: 97.7 F (36.5 C)  SpO2: 97%    Physical Exam HENT:     Head: Normocephalic.     Nose:     Comments: Wearing a mask Eyes:     Pupils: Pupils are equal, round, and reactive to light.  Neck:     Vascular: No carotid bruit.  Cardiovascular:     Rate and Rhythm: Normal rate.     Pulses: Normal pulses.     Heart sounds: Normal heart sounds.  Pulmonary:     Effort: Pulmonary effort is normal.     Breath sounds: Normal breath sounds.  Abdominal:     General: Abdomen is flat.     Palpations: Abdomen is soft.  Musculoskeletal:        General: Normal range of motion.     Right lower leg: Edema present.     Left lower leg: Edema present.  Skin:    Capillary Refill: Capillary refill takes less than 2 seconds.  Neurological:     General: No focal deficit present.     Mental Status: He is alert.  Psychiatric:        Mood and Affect: Mood normal.        Behavior: Behavior normal.        Thought Content: Thought content normal.        Judgment: Judgment normal.     Data: ABI Findings:  +---------+------------------+-----+---------+--------+  Right  Rt Pressure (mmHg)IndexWaveform Comment   +---------+------------------+-----+---------+--------+  Brachial 141                      +---------+------------------+-----+---------+--------+  PTA   155        1.10 triphasic      +---------+------------------+-----+---------+--------+  DP    151        1.07 biphasic       +---------+------------------+-----+---------+--------+  Great Toe129        0.91            +---------+------------------+-----+---------+--------+   +---------+------------------+-----+--------+-------+  Left   Lt  Pressure (mmHg)IndexWaveformComment  +---------+------------------+-----+--------+-------+  PTA   154        1.09 biphasic      +---------+------------------+-----+--------+-------+  DP    157        1.11 biphasic      +---------+------------------+-----+--------+-------+  Great Toe122        0.87           +---------+------------------+-----+--------+-------+   +-------+-----------+-----------+------------+------------+  ABI/TBIToday's ABIToday's TBIPrevious ABIPrevious TBI  +-------+-----------+-----------+------------+------------+  Right 1.10    0.91                  +-------+-----------+-----------+------------+------------+  Left  1.11    0.87                  +-------+-----------+-----------+------------+------------+         Assessment/Plan:     64 year old male presents with bilateral lower extremity leg heaviness left greater than right with swelling left greater than right.  ABIs today are normal with palpable pulses.  I discussed with him the need for smoking cessation.  I have also discussed that his discomfort appears related to swelling.  We will get reflux studies in a few weeks.  At that time we can make a recommendation for compression stockings and fit him at that time.  I have recommended weight loss and continued walking and elevation of his legs when recumbent.  Patient demonstrates good understanding we will see him in few weeks with reflux studies.     Waynetta Sandy MD [facility]

## 2021-02-09 DIAGNOSIS — Z20822 Contact with and (suspected) exposure to covid-19: Secondary | ICD-10-CM | POA: Diagnosis not present

## 2021-02-11 ENCOUNTER — Other Ambulatory Visit: Payer: Self-pay

## 2021-02-11 DIAGNOSIS — R600 Localized edema: Secondary | ICD-10-CM

## 2021-02-23 NOTE — Progress Notes (Signed)
This encounter was created in error - please disregard.

## 2021-02-24 ENCOUNTER — Encounter: Payer: Medicare HMO | Admitting: Cardiology

## 2021-02-24 DIAGNOSIS — E785 Hyperlipidemia, unspecified: Secondary | ICD-10-CM

## 2021-02-24 DIAGNOSIS — R600 Localized edema: Secondary | ICD-10-CM

## 2021-02-24 DIAGNOSIS — R42 Dizziness and giddiness: Secondary | ICD-10-CM

## 2021-02-24 DIAGNOSIS — I10 Essential (primary) hypertension: Secondary | ICD-10-CM

## 2021-02-24 DIAGNOSIS — I2510 Atherosclerotic heart disease of native coronary artery without angina pectoris: Secondary | ICD-10-CM

## 2021-02-26 ENCOUNTER — Other Ambulatory Visit: Payer: Self-pay

## 2021-02-26 ENCOUNTER — Ambulatory Visit: Payer: Medicare HMO | Admitting: Physician Assistant

## 2021-02-26 VITALS — BP 111/64 | HR 69 | Temp 98.6°F | Resp 18 | Ht 76.0 in | Wt 284.0 lb

## 2021-02-26 DIAGNOSIS — I739 Peripheral vascular disease, unspecified: Secondary | ICD-10-CM | POA: Diagnosis not present

## 2021-02-26 DIAGNOSIS — R600 Localized edema: Secondary | ICD-10-CM

## 2021-02-26 DIAGNOSIS — M545 Low back pain, unspecified: Secondary | ICD-10-CM

## 2021-02-26 MED ORDER — CYCLOBENZAPRINE HCL 10 MG PO TABS: 10.0000 mg | ORAL_TABLET | Freq: Three times a day (TID) | ORAL | 0 refills | Status: DC | PRN

## 2021-02-26 NOTE — Progress Notes (Signed)
Established Patient Office Visit  Subjective:  Patient ID: Robert Chan, male    DOB: 05/23/1956  Age: 65 y.o. MRN: [id number]  CC:  Chief Complaint  Patient presents with  . Edema    HPI Robert Chan reports that he continues to have bilateral lower edema, states that his left leg does seem to be more swollen than the right recently.  Reports that he is taking his Lasix on a daily basis, states that he does not feel that he urinates very often despite taking Lasix.  Reports that he is drinking approximately 6 bottles of water a day.  Reports that he does not monitor his sodium intake, does endorse using seasoned salt for his main seasoning on most items, and will also use a saltshaker.   Reports that he has been having lower midline back pain that he describes as a "toothache", states that has been present for the past week.  Denies radiation or numbness or tingling, denies saddle anesthesia.  Reports that he has been lifting weights and attributes the pain to that.  Otherwise denies injury or trauma.  Has been taking Tylenol without much relief.    Past Medical History:  Diagnosis Date  . Asthma   . CAD (coronary artery disease)   . Cataract   . CKD (chronic kidney disease)   . COPD (chronic obstructive pulmonary disease) ([place])   . Depression   . Hypertension   . NSTEMI (non-ST elevated myocardial infarction) ([place])   . Suicidal ideation 06/05/2020    Past Surgical History:  Procedure Laterality Date  . APPENDECTOMY    . EYE SURGERY    . LEFT HEART CATH AND CORONARY ANGIOGRAPHY N/A 06/05/2020   Procedure: LEFT HEART CATH AND CORONARY ANGIOGRAPHY;  Surgeon: Nigel Mormon, MD;  Location: [facility];  Service: Cardiovascular;  Laterality: N/A;  . PROSTATE SURGERY      Family History  Problem Relation Age of Onset  . Hypertension Mother   . Diabetes Mother   . Hypertension Father   . Hypertension Sister     Social History   Socioeconomic History  .  Marital status: Single    Spouse name: Not on file  . Number of children: Not on file  . Years of education: Not on file  . Highest education level: Not on file  Occupational History  . Not on file  Tobacco Use  . Smoking status: Former Smoker    Quit date: 12/25/2020    Years since quitting: 0.1  . Smokeless tobacco: Never Used  . Tobacco comment: smokes 3-4 cigarettes/day  Vaping Use  . Vaping Use: Never used  Substance and Sexual Activity  . Alcohol use: Yes    Comment: drinks 2x/wk, 2-3 40 oz drinks, last drink Friday  . Drug use: Yes    Types: Cocaine    Comment: cocaine use once a month, last use Friday   . Sexual activity: Not Currently  Other Topics Concern  . Not on file  Social History Narrative  . Not on file   Social Determinants of Health   Financial Resource Strain: Not on file  Food Insecurity: Not on file  Transportation Needs: Not on file  Physical Activity: Not on file  Stress: Not on file  Social Connections: Not on file  Intimate Partner Violence: Not on file    Outpatient Medications Prior to Visit  Medication Sig Dispense Refill  . albuterol (PROVENTIL) (2.5 MG/3ML) 0.083% nebulizer solution Take 3 mLs (2.5  mg total) by nebulization every 6 (six) hours as needed for wheezing or shortness of breath. 150 mL 1  . albuterol (VENTOLIN HFA) 108 (90 Base) MCG/ACT inhaler Inhale 1-2 puffs into the lungs every 6 (six) hours as needed for wheezing or shortness of breath. 90 g 2  . ARIPiprazole (ABILIFY) 10 MG tablet Take 1 tablet (10 mg total) by mouth at bedtime. 30 tablet 3  . aspirin 81 MG EC tablet SMARTSIG:1 Tablet(s) By Mouth Daily    . atorvastatin (LIPITOR) 80 MG tablet Take 1 tablet (80 mg total) by mouth daily. 90 tablet 3  . diltiazem (TIAZAC) 240 MG 24 hr capsule Take 240 mg by mouth daily.    . furosemide (LASIX) 20 MG tablet Take 1 tablet (20 mg total) by mouth daily. 40 tablet 2  . hydrOXYzine (ATARAX/VISTARIL) 25 MG tablet Take 1 tablet (25 mg  total) by mouth 3 (three) times daily as needed for anxiety. 60 tablet 1  . latanoprost (XALATAN) 0.005 % ophthalmic solution Place 1 drop into both eyes nightly.    . SYMBICORT 160-4.5 MCG/ACT inhaler Inhale 2 puffs into the lungs in the morning and at bedtime. 10.2 g 11  . traZODone (DESYREL) 50 MG tablet Take 0.5 tablets (25 mg total) by mouth at bedtime as needed for sleep. 60 tablet 0  . Vitamin D, Ergocalciferol, (DRISDOL) 1.25 MG (50000 UNIT) CAPS capsule Take 1 capsule (50,000 Units total) by mouth once a week. 12 capsule 1   No facility-administered medications prior to visit.    Allergies  Allergen Reactions  . Shellfish Allergy Hives and Rash    ROS Review of Systems  Constitutional: Negative for chills and fever.  HENT: Negative.   Eyes: Negative.   Respiratory: Negative for shortness of breath.   Cardiovascular: Positive for leg swelling. Negative for chest pain.  Endocrine: Negative.   Genitourinary: Negative for difficulty urinating.  Musculoskeletal: Positive for back pain.  Skin: Negative.   Allergic/Immunologic: Negative.   Neurological: Negative.   Hematological: Negative.   Psychiatric/Behavioral: Negative.       Objective:    Physical Exam Vitals and nursing note reviewed.  Constitutional:      General: He is not in acute distress.    Appearance: Normal appearance. He is not ill-appearing.  HENT:     Head: Normocephalic and atraumatic.     Right Ear: External ear normal.     Left Ear: External ear normal.     Nose: Nose normal.     Mouth/Throat:     Mouth: Mucous membranes are moist.     Pharynx: Oropharynx is clear.  Eyes:     Extraocular Movements: Extraocular movements intact.     Conjunctiva/sclera: Conjunctivae normal.     Pupils: Pupils are equal, round, and reactive to light.  Cardiovascular:     Rate and Rhythm: Normal rate and regular rhythm.     Pulses: Normal pulses.          Carotid pulses are 2+ on the right side and 2+ on the  left side.      Radial pulses are 2+ on the right side and 2+ on the left side.       Femoral pulses are 2+ on the right side and 2+ on the left side.      Popliteal pulses are 2+ on the right side and 2+ on the left side.       Dorsalis pedis pulses are 2+ on the right side and 2+  on the left side.       Posterior tibial pulses are 2+ on the right side and 2+ on the left side.     Heart sounds: Normal heart sounds.  Pulmonary:     Effort: Pulmonary effort is normal.     Breath sounds: Normal breath sounds.  Musculoskeletal:     Cervical back: Normal, normal range of motion and neck supple.     Thoracic back: Normal.     Lumbar back: Tenderness present. No swelling or spasms.     Right lower leg: 2+ Edema present.     Left lower leg: 2+ Edema present.  Skin:    General: Skin is warm.  Neurological:     General: No focal deficit present.     Mental Status: He is alert and oriented to person, place, and time.  Psychiatric:        Mood and Affect: Mood normal.        Behavior: Behavior normal.        Thought Content: Thought content normal.        Judgment: Judgment normal.     BP 111/64 (BP Location: Right Arm, Patient Position: Sitting, Cuff Size: Large)   Pulse 69   Temp 98.6 F (37 C) (Oral)   Resp 18   Ht 6' 4" (1.93 m)   Wt 284 lb (128.8 kg)   SpO2 98%   BMI 34.57 kg/m  Wt Readings from Last 3 Encounters:  02/26/21 284 lb (128.8 kg)  02/06/21 270 lb (122.5 kg)  01/08/21 267 lb (121.1 kg)     Health Maintenance Due  Topic Date Due  . COLON CANCER SCREENING ANNUAL FOBT  Never done    There are no preventive care reminders to display for this patient.  Lab Results  Component Value Date   TSH 2.640 09/09/2020   Lab Results  Component Value Date   WBC 8.0 12/03/2020   HGB 11.6 (L) 12/03/2020   HCT 36.8 (L) 12/03/2020   MCV 93.2 12/03/2020   PLT 233 12/03/2020   Lab Results  Component Value Date   NA 140 12/25/2020   K 4.0 12/25/2020   CO2 19 (L)  12/25/2020   GLUCOSE 123 (H) 12/25/2020   BUN 15 12/25/2020   CREATININE 1.81 (H) 12/25/2020   BILITOT 0.5 12/25/2020   ALKPHOS 76 12/25/2020   AST 22 12/25/2020   ALT 18 12/25/2020   PROT 7.6 12/25/2020   ALBUMIN 4.5 12/25/2020   CALCIUM 9.3 12/25/2020   ANIONGAP 12 12/03/2020   EGFR 41 (L) 12/25/2020   Lab Results  Component Value Date   CHOL 131 12/25/2020   Lab Results  Component Value Date   HDL 62 12/25/2020   Lab Results  Component Value Date   LDLCALC 55 12/25/2020   Lab Results  Component Value Date   TRIG 71 12/25/2020   Lab Results  Component Value Date   CHOLHDL 2.1 12/25/2020   Lab Results  Component Value Date   HGBA1C 4.8 09/07/2020      Assessment & Plan:   Problem List Items Addressed This Visit      Cardiovascular and Mediastinum   PAD (peripheral artery disease) ([place])    Other Visit Diagnoses    Bilateral lower extremity edema    -  Primary   Acute bilateral low back pain without sciatica       Relevant Medications   cyclobenzaprine (FLEXERIL) 10 MG tablet     1. Bilateral lower  extremity edema Patient has upcoming evaluation with a vascular ultrasound due to his peripheral artery disease.  Patient is currently taking 20 mg of Lasix, and does have decreased GFR.  Patient education given on working on lowering the sodium in his diet versus increasing Lasix.  Patient agrees to trial of a low-sodium diet, keeping feet elevated when able and staying well-hydrated.  Patient to return to mobile unit in 1 week for follow-up.  2. Acute bilateral low back pain without sciatica Patient education given, trial Flexeril, continue Tylenol or ibuprofen as tolerated, encouraged stretching and hydration.  Red flags given for prompt reevaluation - cyclobenzaprine (FLEXERIL) 10 MG tablet; Take 1 tablet (10 mg total) by mouth 3 (three) times daily as needed for muscle spasms.  Dispense: 30 tablet; Refill: 0  3. PAD (peripheral artery disease) ([place])    I  have reviewed the patient's medical history (PMH, PSH, Social History, Family History, Medications, and allergies) , and have been updated if relevant. I spent 34 minutes reviewing chart and  face to face time with patient.     Meds ordered this encounter  Medications  . cyclobenzaprine (FLEXERIL) 10 MG tablet    Sig: Take 1 tablet (10 mg total) by mouth 3 (three) times daily as needed for muscle spasms.    Dispense:  30 tablet    Refill:  0    Order Specific Question:   Supervising Provider    Answer:   Elsie Stain [[id number]]    Follow-up: Return in about 1 week (around 03/05/2021).    Loraine Grip Mayers, PA-C

## 2021-02-26 NOTE — Progress Notes (Signed)
Patient has eaten today but has taken no medication. Patient complains of right leg swelling and now the left leg is swelling. Patient reports "I love salt" and wonders if this is attributing to the swelling in his legs.

## 2021-02-26 NOTE — Patient Instructions (Addendum)
For your back pain, I encourage you to use ibuprofen over-the-counter, and use the muscle relaxers every 8 hours as needed.  Gentle stretching, keep your hydration going as well.  For your leg swelling, I encourage you to continue your Lasix, and work on a low-sodium diet.  Keep your feet elevated when able.  Please return to the mobile unit in 1 week for further evaluation.  Kennieth Rad, PA-C Physician Assistant [facility] [email or web address]    Low-Sodium Eating Plan Sodium, which is an element that makes up salt, helps you maintain a healthy balance of fluids in your body. Too much sodium can increase your blood pressure and cause fluid and waste to be held in your body. Your health care provider or dietitian may recommend following this plan if you have high blood pressure (hypertension), kidney disease, liver disease, or heart failure. Eating less sodium can help lower your blood pressure, reduce swelling, and protect your heart, liver, and kidneys. What are tips for following this plan? Reading food labels  The Nutrition Facts label lists the amount of sodium in one serving of the food. If you eat more than one serving, you must multiply the listed amount of sodium by the number of servings.  Choose foods with less than 140 mg of sodium per serving.  Avoid foods with 300 mg of sodium or more per serving. Shopping  Look for lower-sodium products, often labeled as "low-sodium" or "no salt added."  Always check the sodium content, even if foods are labeled as "unsalted" or "no salt added."  Buy fresh foods. ? Avoid canned foods and pre-made or frozen meals. ? Avoid canned, cured, or processed meats.  Buy breads that have less than 80 mg of sodium per slice.   Cooking  Eat more home-cooked food and less restaurant, buffet, and fast food.  Avoid adding salt when cooking. Use salt-free seasonings or herbs instead of  table salt or sea salt. Check with your health care provider or pharmacist before using salt substitutes.  Cook with plant-based oils, such as canola, sunflower, or olive oil.   Meal planning  When eating at a restaurant, ask that your food be prepared with less salt or no salt, if possible. Avoid dishes labeled as brined, pickled, cured, smoked, or made with soy sauce, miso, or teriyaki sauce.  Avoid foods that contain MSG (monosodium glutamate). MSG is sometimes added to [place] food, bouillon, and some canned foods.  Make meals that can be grilled, baked, poached, roasted, or steamed. These are generally made with less sodium. General information Most people on this plan should limit their sodium intake to 1,500-2,000 mg (milligrams) of sodium each day. What foods should I eat? Fruits Fresh, frozen, or canned fruit. Fruit juice. Vegetables Fresh or frozen vegetables. "No salt added" canned vegetables. "No salt added" tomato sauce and paste. Low-sodium or reduced-sodium tomato and vegetable juice. Grains Low-sodium cereals, including oats, puffed wheat and rice, and shredded wheat. Low-sodium crackers. Unsalted rice. Unsalted pasta. Low-sodium bread. Whole-grain breads and whole-grain pasta. Meats and other proteins Fresh or frozen (no salt added) meat, poultry, seafood, and fish. Low-sodium canned tuna and salmon. Unsalted nuts. Dried peas, beans, and lentils without added salt. Unsalted canned beans. Eggs. Unsalted nut butters. Dairy Milk. Soy milk. Cheese that is naturally low in sodium, such as ricotta cheese, fresh mozzarella, or Swiss cheese. Low-sodium or reduced-sodium cheese. Cream cheese. Yogurt. Seasonings and condiments Fresh and dried herbs and spices. Salt-free seasonings. Low-sodium mustard and  ketchup. Sodium-free salad dressing. Sodium-free light mayonnaise. Fresh or refrigerated horseradish. Lemon juice. Vinegar. Other foods Homemade, reduced-sodium, or low-sodium soups.  Unsalted popcorn and pretzels. Low-salt or salt-free chips. The items listed above may not be a complete list of foods and beverages you can eat. Contact a dietitian for more information. What foods should I avoid? Vegetables Sauerkraut, pickled vegetables, and relishes. Olives. [place] fries. Onion rings. Regular canned vegetables (not low-sodium or reduced-sodium). Regular canned tomato sauce and paste (not low-sodium or reduced-sodium). Regular tomato and vegetable juice (not low-sodium or reduced-sodium). Frozen vegetables in sauces. Grains Instant hot cereals. Bread stuffing, pancake, and biscuit mixes. Croutons. Seasoned rice or pasta mixes. Noodle soup cups. Boxed or frozen macaroni and cheese. Regular salted crackers. Self-rising flour. Meats and other proteins Meat or fish that is salted, canned, smoked, spiced, or pickled. Precooked or cured meat, such as sausages or meat loaves. [name]. Ham. Pepperoni. Hot dogs. Corned beef. Chipped beef. Salt pork. Jerky. Pickled herring. Anchovies and sardines. Regular canned tuna. Salted nuts. Dairy Processed cheese and cheese spreads. Hard cheeses. Cheese curds. Blue cheese. Feta cheese. String cheese. Regular cottage cheese. Buttermilk. Canned milk. Fats and oils Salted butter. Regular margarine. Ghee. Bacon fat. Seasonings and condiments Onion salt, garlic salt, seasoned salt, table salt, and sea salt. Canned and packaged gravies. Worcestershire sauce. Tartar sauce. Barbecue sauce. Teriyaki sauce. Soy sauce, including reduced-sodium. Steak sauce. Fish sauce. Oyster sauce. Cocktail sauce. Horseradish that you find on the shelf. Regular ketchup and mustard. Meat flavorings and tenderizers. Bouillon cubes. Hot sauce. Pre-made or packaged marinades. Pre-made or packaged taco seasonings. Relishes. Regular salad dressings. Salsa. Other foods Salted popcorn and pretzels. Corn chips and puffs. Potato and tortilla chips. Canned or dried soups. Pizza. Frozen  entrees and pot pies. The items listed above may not be a complete list of foods and beverages you should avoid. Contact a dietitian for more information. Summary  Eating less sodium can help lower your blood pressure, reduce swelling, and protect your heart, liver, and kidneys.  Most people on this plan should limit their sodium intake to 1,500-2,000 mg (milligrams) of sodium each day.  Canned, boxed, and frozen foods are high in sodium. Restaurant foods, fast foods, and pizza are also very high in sodium. You also get sodium by adding salt to food.  Try to cook at home, eat more fresh fruits and vegetables, and eat less fast food and canned, processed, or prepared foods. This information is not intended to replace advice given to you by your health care provider. Make sure you discuss any questions you have with your health care provider. Document Revised: 11/23/2019 Document Reviewed: 09/19/2019 Elsevier Patient Education  2021 [place].   Acute Back Pain, Adult Acute back pain is sudden and usually short-lived. It is often caused by an injury to the muscles and tissues in the back. The injury may result from:  A muscle or ligament getting overstretched or torn (strained). Ligaments are tissues that connect bones to each other. Lifting something improperly can cause a back strain.  Wear and tear (degeneration) of the spinal disks. Spinal disks are circular tissue that provide cushioning between the bones of the spine (vertebrae).  Twisting motions, such as while playing sports or doing yard work.  A hit to the back.  Arthritis. You may have a physical exam, lab tests, and imaging tests to find the cause of your pain. Acute back pain usually goes away with rest and home care. Follow these instructions at  home: Managing pain, stiffness, and swelling  Treatment may include medicines for pain and inflammation that are taken by mouth or applied to the skin, prescription pain  medicine, or muscle relaxants. Take over-the-counter and prescription medicines only as told by your health care provider.  Your health care provider may recommend applying ice during the first 24-48 hours after your pain starts. To do this: ? Put ice in a plastic bag. ? Place a towel between your skin and the bag. ? Leave the ice on for 20 minutes, 2-3 times a day.  If directed, apply heat to the affected area as often as told by your health care provider. Use the heat source that your health care provider recommends, such as a moist heat pack or a heating pad. ? Place a towel between your skin and the heat source. ? Leave the heat on for 20-30 minutes. ? Remove the heat if your skin turns bright red. This is especially important if you are unable to feel pain, heat, or cold. You have a greater risk of getting burned. Activity  Do not stay in bed. Staying in bed for more than 1-2 days can delay your recovery.  Sit up and stand up straight. Avoid leaning forward when you sit or hunching over when you stand. ? If you work at a desk, sit close to it so you do not need to lean over. Keep your chin tucked in. Keep your neck drawn back, and keep your elbows bent at a 90-degree angle (right angle). ? Sit high and close to the steering wheel when you drive. Add lower back (lumbar) support to your car seat, if needed.  Take short walks on even surfaces as soon as you are able. Try to increase the length of time you walk each day.  Do not sit, drive, or stand in one place for more than 30 minutes at a time. Sitting or standing for long periods of time can put stress on your back.  Do not drive or use heavy machinery while taking prescription pain medicine.  Use proper lifting techniques. When you bend and lift, use positions that put less stress on your back: ? [place] your knees. ? Keep the load close to your body. ? Avoid twisting.  Exercise regularly as told by your health care provider.  Exercising helps your back heal faster and helps prevent back injuries by keeping muscles strong and flexible.  Work with a physical therapist to make a safe exercise program, as recommended by your health care provider. Do any exercises as told by your physical therapist.   Lifestyle  Maintain a healthy weight. Extra weight puts stress on your back and makes it difficult to have good posture.  Avoid activities or situations that make you feel anxious or stressed. Stress and anxiety increase muscle tension and can make back pain worse. Learn ways to manage anxiety and stress, such as through exercise. General instructions  Sleep on a firm mattress in a comfortable position. Try lying on your side with your knees slightly bent. If you lie on your back, put a pillow under your knees.  Follow your treatment plan as told by your health care provider. This may include: ? Cognitive or behavioral therapy. ? Acupuncture or massage therapy. ? Meditation or yoga. Contact a health care provider if:  You have pain that is not relieved with rest or medicine.  You have increasing pain going down into your legs or buttocks.  Your pain does not  improve after 2 weeks.  You have pain at night.  You lose weight without trying.  You have a fever or chills. Get help right away if:  You develop new bowel or bladder control problems.  You have unusual weakness or numbness in your arms or legs.  You develop nausea or vomiting.  You develop abdominal pain.  You feel faint. Summary  Acute back pain is sudden and usually short-lived.  Use proper lifting techniques. When you bend and lift, use positions that put less stress on your back.  Take over-the-counter and prescription medicines and apply heat or ice as directed by your health care provider. This information is not intended to replace advice given to you by your health care provider. Make sure you discuss any questions you have with your  health care provider. Document Revised: 07/11/2020 Document Reviewed: 07/11/2020 Elsevier Patient Education  2021 [employer].

## 2021-02-27 DIAGNOSIS — M545 Low back pain, unspecified: Secondary | ICD-10-CM | POA: Insufficient documentation

## 2021-02-27 DIAGNOSIS — R600 Localized edema: Secondary | ICD-10-CM | POA: Insufficient documentation

## 2021-02-27 HISTORY — DX: Low back pain, unspecified: M54.50

## 2021-03-03 DIAGNOSIS — R69 Illness, unspecified: Secondary | ICD-10-CM | POA: Diagnosis not present

## 2021-03-04 ENCOUNTER — Telehealth: Payer: Self-pay | Admitting: Critical Care Medicine

## 2021-03-04 NOTE — Telephone Encounter (Signed)
Prior Auth was faxed for Cyclobenzaprine HCI 10MG  Tablets

## 2021-03-04 NOTE — Telephone Encounter (Signed)
MA will pick up when specimens are dropped off to review the request.

## 2021-03-05 ENCOUNTER — Ambulatory Visit: Payer: Medicare HMO | Admitting: Physician Assistant

## 2021-03-05 ENCOUNTER — Other Ambulatory Visit: Payer: Self-pay

## 2021-03-05 VITALS — BP 108/66 | HR 75 | Temp 98.2°F | Resp 18

## 2021-03-05 DIAGNOSIS — M545 Low back pain, unspecified: Secondary | ICD-10-CM | POA: Diagnosis not present

## 2021-03-05 DIAGNOSIS — R600 Localized edema: Secondary | ICD-10-CM

## 2021-03-05 MED ORDER — CYCLOBENZAPRINE HCL 10 MG PO TABS: 10.0000 mg | ORAL_TABLET | Freq: Three times a day (TID) | ORAL | 0 refills | Status: DC | PRN

## 2021-03-05 NOTE — Progress Notes (Signed)
Established Patient Office Visit  Subjective:  Patient ID: Robert Chan, male    DOB: Mar 11, 1956  Age: 65 y.o. MRN: [id number]  CC:  Chief Complaint  Patient presents with  . Back Pain    HPI Robert Chan was seen in the mobile unit last week and was encouraged to return in 1 week after attempting a low sodium diet.  Reports that he continues to have bilateral lower edema.  Reports that he did not follow a low-sodium diet over the past week.  Denies shortness of breath.  Reports that he continues to have lower back pain that he describes as a tooth ache.  Reports that he did pick up the Flexeril, however unfortunately he lost the prescription and has not taken any.  Denies numbness or tingling.  Denies saddle anesthesia.  No new concerns at this time  Past Medical History:  Diagnosis Date  . Asthma   . CAD (coronary artery disease)   . Cataract   . CKD (chronic kidney disease)   . COPD (chronic obstructive pulmonary disease) ([place])   . Depression   . Hypertension   . NSTEMI (non-ST elevated myocardial infarction) ([place])   . Suicidal ideation 06/05/2020    Past Surgical History:  Procedure Laterality Date  . APPENDECTOMY    . EYE SURGERY    . LEFT HEART CATH AND CORONARY ANGIOGRAPHY N/A 06/05/2020   Procedure: LEFT HEART CATH AND CORONARY ANGIOGRAPHY;  Surgeon: Nigel Mormon, MD;  Location: [facility];  Service: Cardiovascular;  Laterality: N/A;  . PROSTATE SURGERY      Family History  Problem Relation Age of Onset  . Hypertension Mother   . Diabetes Mother   . Hypertension Father   . Hypertension Sister     Social History   Socioeconomic History  . Marital status: Single    Spouse name: Not on file  . Number of children: Not on file  . Years of education: Not on file  . Highest education level: Not on file  Occupational History  . Not on file  Tobacco Use  . Smoking status: Former Smoker    Quit date: 12/25/2020    Years since quitting:  0.1  . Smokeless tobacco: Never Used  . Tobacco comment: smokes 3-4 cigarettes/day  Vaping Use  . Vaping Use: Never used  Substance and Sexual Activity  . Alcohol use: Yes    Comment: drinks 2x/wk, 2-3 40 oz drinks, last drink Friday  . Drug use: Yes    Types: Cocaine    Comment: cocaine use once a month, last use Friday   . Sexual activity: Not Currently  Other Topics Concern  . Not on file  Social History Narrative  . Not on file   Social Determinants of Health   Financial Resource Strain: Not on file  Food Insecurity: Not on file  Transportation Needs: Not on file  Physical Activity: Not on file  Stress: Not on file  Social Connections: Not on file  Intimate Partner Violence: Not on file    Outpatient Medications Prior to Visit  Medication Sig Dispense Refill  . albuterol (PROVENTIL) (2.5 MG/3ML) 0.083% nebulizer solution Take 3 mLs (2.5 mg total) by nebulization every 6 (six) hours as needed for wheezing or shortness of breath. 150 mL 1  . albuterol (VENTOLIN HFA) 108 (90 Base) MCG/ACT inhaler Inhale 1-2 puffs into the lungs every 6 (six) hours as needed for wheezing or shortness of breath. 90 g 2  . ARIPiprazole (  ABILIFY) 10 MG tablet Take 1 tablet (10 mg total) by mouth at bedtime. 30 tablet 3  . aspirin 81 MG EC tablet SMARTSIG:1 Tablet(s) By Mouth Daily    . atorvastatin (LIPITOR) 80 MG tablet Take 1 tablet (80 mg total) by mouth daily. 90 tablet 3  . diltiazem (TIAZAC) 240 MG 24 hr capsule Take 240 mg by mouth daily.    . furosemide (LASIX) 20 MG tablet Take 1 tablet (20 mg total) by mouth daily. 40 tablet 2  . hydrOXYzine (ATARAX/VISTARIL) 25 MG tablet Take 1 tablet (25 mg total) by mouth 3 (three) times daily as needed for anxiety. 60 tablet 1  . latanoprost (XALATAN) 0.005 % ophthalmic solution Place 1 drop into both eyes nightly.    . SYMBICORT 160-4.5 MCG/ACT inhaler Inhale 2 puffs into the lungs in the morning and at bedtime. 10.2 g 11  . traZODone (DESYREL) 50  MG tablet Take 0.5 tablets (25 mg total) by mouth at bedtime as needed for sleep. 60 tablet 0  . Vitamin D, Ergocalciferol, (DRISDOL) 1.25 MG (50000 UNIT) CAPS capsule Take 1 capsule (50,000 Units total) by mouth once a week. 12 capsule 1  . cyclobenzaprine (FLEXERIL) 10 MG tablet Take 1 tablet (10 mg total) by mouth 3 (three) times daily as needed for muscle spasms. 30 tablet 0   No facility-administered medications prior to visit.    Allergies  Allergen Reactions  . Shellfish Allergy Hives and Rash    ROS Review of Systems  Constitutional: Negative for chills and fever.  HENT: Negative.   Eyes: Negative.   Respiratory: Negative for shortness of breath.   Cardiovascular: Positive for leg swelling. Negative for chest pain and palpitations.  Gastrointestinal: Negative.   Endocrine: Negative.   Genitourinary: Negative for testicular pain.  Musculoskeletal: Positive for back pain.  Skin: Negative.   Allergic/Immunologic: Negative.   Neurological: Negative.   Hematological: Negative.   Psychiatric/Behavioral: Negative.       Objective:    Physical Exam Vitals and nursing note reviewed.  Constitutional:      Appearance: Normal appearance.  HENT:     Head: Normocephalic.     Right Ear: External ear normal.     Left Ear: External ear normal.     Nose: Nose normal.     Mouth/Throat:     Mouth: Mucous membranes are moist.     Pharynx: Oropharynx is clear.  Eyes:     Extraocular Movements: Extraocular movements intact.     Conjunctiva/sclera: Conjunctivae normal.     Pupils: Pupils are equal, round, and reactive to light.  Cardiovascular:     Rate and Rhythm: Normal rate and regular rhythm.     Pulses: Normal pulses.     Heart sounds: Normal heart sounds.  Pulmonary:     Effort: Pulmonary effort is normal.     Breath sounds: Normal breath sounds.  Musculoskeletal:     Cervical back: Normal range of motion and neck supple.     Lumbar back: Tenderness present. No  swelling or spasms. Decreased range of motion.  Skin:    General: Skin is warm and dry.  Neurological:     General: No focal deficit present.     Mental Status: He is alert and oriented to person, place, and time.  Psychiatric:        Mood and Affect: Mood normal.        Behavior: Behavior normal.        Thought Content: Thought content normal.  Judgment: Judgment normal.     BP 108/66 (BP Location: Left Arm, Patient Position: Sitting, Cuff Size: Normal)   Pulse 75   Temp 98.2 F (36.8 C) (Oral)   Resp 18   SpO2 97%  Wt Readings from Last 3 Encounters:  02/26/21 284 lb (128.8 kg)  02/06/21 270 lb (122.5 kg)  01/08/21 267 lb (121.1 kg)     Health Maintenance Due  Topic Date Due  . COLON CANCER SCREENING ANNUAL FOBT  Never done    There are no preventive care reminders to display for this patient.  Lab Results  Component Value Date   TSH 2.640 09/09/2020   Lab Results  Component Value Date   WBC 8.0 12/03/2020   HGB 11.6 (L) 12/03/2020   HCT 36.8 (L) 12/03/2020   MCV 93.2 12/03/2020   PLT 233 12/03/2020   Lab Results  Component Value Date   NA 140 12/25/2020   K 4.0 12/25/2020   CO2 19 (L) 12/25/2020   GLUCOSE 123 (H) 12/25/2020   BUN 15 12/25/2020   CREATININE 1.81 (H) 12/25/2020   BILITOT 0.5 12/25/2020   ALKPHOS 76 12/25/2020   AST 22 12/25/2020   ALT 18 12/25/2020   PROT 7.6 12/25/2020   ALBUMIN 4.5 12/25/2020   CALCIUM 9.3 12/25/2020   ANIONGAP 12 12/03/2020   EGFR 41 (L) 12/25/2020   Lab Results  Component Value Date   CHOL 131 12/25/2020   Lab Results  Component Value Date   HDL 62 12/25/2020   Lab Results  Component Value Date   LDLCALC 55 12/25/2020   Lab Results  Component Value Date   TRIG 71 12/25/2020   Lab Results  Component Value Date   CHOLHDL 2.1 12/25/2020   Lab Results  Component Value Date   HGBA1C 4.8 09/07/2020      Assessment & Plan:   Problem List Items Addressed This Visit      Other    Bilateral lower extremity edema - Primary   Acute bilateral low back pain without sciatica   Relevant Medications   cyclobenzaprine (FLEXERIL) 10 MG tablet      Meds ordered this encounter  Medications  . cyclobenzaprine (FLEXERIL) 10 MG tablet    Sig: Take 1 tablet (10 mg total) by mouth 3 (three) times daily as needed for muscle spasms.    Dispense:  30 tablet    Refill:  0    Order Specific Question:   Supervising Provider    Answer:   Joya Gaskins, PATRICK E [[id number]   1. Acute bilateral low back pain without sciatica Refill Flexeril.  Patient encouraged to continue proper hydration, rest, gentle stretching.  Red flags given for prompt reevaluation. - cyclobenzaprine (FLEXERIL) 10 MG tablet; Take 1 tablet (10 mg total) by mouth 3 (three) times daily as needed for muscle spasms.  Dispense: 30 tablet; Refill: 0  2. Bilateral lower extremity edema Once again encouraged trial of low-sodium diet, continue follow-up with vascular specialist.  Reiterated patient education from last week's office visit.  Patient understands and agrees.   I have reviewed the patient's medical history (PMH, PSH, Social History, Family History, Medications, and allergies) , and have been updated if relevant. I spent 11 minutes reviewing chart and  face to face time with patient.     Follow-up: Return if symptoms worsen or fail to improve.    Loraine Grip Mayers, PA-C

## 2021-03-05 NOTE — Progress Notes (Signed)
Patient has eaten today and patient has taken medication. Patient needs refill on flexeril due to losing the bottle.

## 2021-03-05 NOTE — Patient Instructions (Signed)
I strongly encourage you to follow a low-sodium diet for the next few weeks, monitoring your sodium intake so you are not eating more than 1500 to 2000 mg of sodium a day.  Continue drinking lots of water.  I resent the Flexeril to your pharmacy.  Please let us know if there is anything else we can do for you.  Kennieth Rad, PA-C Physician Assistant [facility] [email or web address]    Cooking With Less Salt Cooking with less salt is one way to reduce the amount of sodium you get from food. Sodium is one of the elements that make up salt. It is found naturally in foods and is also added to certain foods. Depending on your condition and overall health, your health care provider or dietitian may recommend that you reduce your sodium intake. Most people should have less than 2,300 milligrams (mg) of sodium each day. If you have high blood pressure (hypertension), you may need to limit your sodium to 1,500 mg each day. Follow the tips below to help reduce your sodium intake. What are tips for eating less sodium? Reading food labels  Check the food label before buying or using packaged ingredients. Always check the label for the serving size and sodium content.  Look for products with no more than 140 mg of sodium in one serving.  Check the % Daily Value column to see what percent of the daily recommended amount of sodium is provided in one serving of the product. Foods with 5% or less in this column are considered low in sodium. Foods with 20% or higher are considered high in sodium.  Do not choose foods with salt as one of the first three ingredients on the ingredients list. If salt is one of the first three ingredients, it usually means the item is high in sodium.   Shopping  Buy sodium-free or low-sodium products. Look for the following words on food labels: ? Low-sodium. ? Sodium-free. ? Reduced-sodium. ? No salt  added. ? Unsalted.  Always check the sodium content even if foods are labeled as low-sodium or no salt added.  Buy fresh foods. Cooking  Use herbs, seasonings without salt, and spices as substitutes for salt.  Use sodium-free baking soda when baking.  Grill, braise, or roast foods to add flavor with less salt.  Avoid adding salt to pasta, rice, or hot cereals.  Drain and rinse canned vegetables, beans, and meat before use.  Avoid adding salt when cooking sweets and desserts.  Cook with low-sodium ingredients. What foods are high in sodium? Vegetables Regular canned vegetables (not low-sodium or reduced-sodium). Sauerkraut, pickled vegetables, and relishes. Olives. [place] fries. Onion rings. Regular canned tomato sauce and paste. Regular tomato and vegetable juice. Frozen vegetables in sauces. Grains Instant hot cereals. Bread stuffing, pancake, and biscuit mixes. Croutons. Seasoned rice or pasta mixes. Noodle soup cups. Boxed or frozen macaroni and cheese. Regular salted crackers. Self-rising flour. Rolls. Bagels. Flour tortillas and wraps. Meats and other proteins Meat or fish that is salted, canned, smoked, cured, spiced, or pickled. This includes bacon, ham, sausages, hot dogs, corned beef, chipped beef, meat loaves, salt pork, jerky, pickled herring, anchovies, regular canned tuna, and sardines. Salted nuts. Dairy Processed cheese and cheese spreads. Cheese curds. Blue cheese. Feta cheese. String cheese. Regular cottage cheese. Buttermilk. Canned milk. The items listed above may not be a complete list of foods high in sodium. Actual amounts of sodium may be different depending on processing. Contact a dietitian for  more information. What foods are low in sodium? Fruits Fresh, frozen, or canned fruit with no sauce added. Fruit juice. Vegetables Fresh or frozen vegetables with no sauce added. "No salt added" canned vegetables. "No salt added" tomato sauce and paste. Low-sodium or  reduced-sodium tomato and vegetable juice. Grains Noodles, pasta, quinoa, rice. Shredded or puffed wheat or puffed rice. Regular or quick oats (not instant). Low-sodium crackers. Low-sodium bread. Whole-grain bread and whole-grain pasta. Unsalted popcorn. Meats and other proteins Fresh or frozen whole meats, poultry (not injected with sodium), and fish with no sauce added. Unsalted nuts. Dried peas, beans, and lentils without added salt. Unsalted canned beans. Eggs. Unsalted nut butters. Low-sodium canned tuna or chicken. Dairy Milk. Soy milk. Yogurt. Low-sodium cheeses, such as Swiss, Monterey Jack, [place], and [employer]. Sherbet or ice cream (keep to  cup per serving). Cream cheese. Fats and oils Unsalted butter or margarine. Other foods Homemade pudding. Sodium-free baking soda and baking powder. Herbs and spices. Low-sodium seasoning mixes. Beverages Coffee and tea. Carbonated beverages. The items listed above may not be a complete list of foods low in sodium. Actual amounts of sodium may be different depending on processing. Contact a dietitian for more information. What are some salt alternatives when cooking? The following are herbs, seasonings, and spices that can be used instead of salt to flavor your food. Herbs should be fresh or dried. Do not choose packaged mixes. Next to the name of the herb, spice, or seasoning are some examples of foods you can pair it with. Herbs  Bay leaves - Soups, meat and vegetable dishes, and spaghetti sauce.  Basil - [employer], soups, pasta, and fish dishes.  Cilantro - Meat, poultry, and vegetable dishes.  Chili powder - Marinades and Mexican dishes.  Chives - Salad dressings and potato dishes.  Cumin - Mexican dishes, couscous, and meat dishes.  Dill - Fish dishes, sauces, and salads.  Fennel - Meat and vegetable dishes, breads, and cookies.  Garlic (do not use garlic salt) - [place] dishes, meat dishes, salad dressings, and  sauces.  Marjoram - Soups, potato dishes, and meat dishes.  Oregano - Pizza and spaghetti sauce.  Parsley - Salads, soups, pasta, and meat dishes.  Rosemary - [place] dishes, salad dressings, soups, and red meats.  Saffron - Fish dishes, pasta, and some poultry dishes.  Sage - Stuffings and sauces.  Tarragon - Fish and [employer].  Thyme - Stuffing, meat, and fish dishes. Seasonings  Lemon juice - Fish dishes, poultry dishes, vegetables, and salads.  Vinegar - Salad dressings, vegetables, and fish dishes. Spices  Cinnamon - Sweet dishes, such as cakes, cookies, and puddings.  Cloves - Gingerbread, puddings, and marinades for meats.  Curry - Vegetable dishes, fish and poultry dishes, and stir-fry dishes.  Ginger - Vegetable dishes, fish dishes, and stir-fry dishes.  Nutmeg - Pasta, vegetables, poultry, fish dishes, and custard. Summary  Cooking with less salt is one way to reduce the amount of sodium that you get from food.  Buy sodium-free or low-sodium products.  Check the food label before using or buying packaged ingredients.  Use herbs, seasonings without salt, and spices as substitutes for salt in foods. This information is not intended to replace advice given to you by your health care provider. Make sure you discuss any questions you have with your health care provider. Document Revised: 10/10/2019 Document Reviewed: 10/10/2019 Elsevier Patient Education  [place].

## 2021-03-11 NOTE — Progress Notes (Signed)
VASCULAR & VEIN SPECIALISTS           [facility] [place]  History and Physical   Robert Chan is a 66 y.o. male who saw Dr. Donzetta Matters on 02/06/2021 for BLE leg heaviness with left worse than right and swelling in the left > right.  His ABI were normal with palpable pulses.  He scheduled him for follow up for reflux studies given his normal arterial exam.  He discussed smoking cessation with pt.  He also recommended weight loss and continued walking and elevating his legs.    The patient has remote history of DVT many years ago while hospitalized and he thinks it was the left leg.  He states that he has cut back on his salt a little bit as Dr. Radford Pax had talked with him about this back in March.  He states he does elevate his legs occasionally.  He does not wear compression.    He is accompanied by his daughter.  She is due to give birth to her 6th child on June 4th.   The pt is on a statin for cholesterol management.  The pt is on a daily aspirin.   Other AC:  none The pt is on CCB for hypertension.   The pt is not diabetic.   Tobacco hx:  former   Past Medical History:  Diagnosis Date  . Asthma   . CAD (coronary artery disease)   . Cataract   . CKD (chronic kidney disease)   . COPD (chronic obstructive pulmonary disease) ([place])   . Depression   . Hypertension   . NSTEMI (non-ST elevated myocardial infarction) ([place])   . Suicidal ideation 06/05/2020    Past Surgical History:  Procedure Laterality Date  . APPENDECTOMY    . EYE SURGERY    . LEFT HEART CATH AND CORONARY ANGIOGRAPHY N/A 06/05/2020   Procedure: LEFT HEART CATH AND CORONARY ANGIOGRAPHY;  Surgeon: Nigel Mormon, MD;  Location: [facility];  Service: Cardiovascular;  Laterality: N/A;  . PROSTATE SURGERY      Social History   Socioeconomic History  . Marital status: Single    Spouse name: Not on file  . Number of children: Not on file  . Years of education: Not on file  . Highest education level:  Not on file  Occupational History  . Not on file  Tobacco Use  . Smoking status: Former Smoker    Quit date: 12/25/2020    Years since quitting: 0.2  . Smokeless tobacco: Never Used  . Tobacco comment: smokes 3-4 cigarettes/day  Vaping Use  . Vaping Use: Never used  Substance and Sexual Activity  . Alcohol use: Yes    Comment: drinks 2x/wk, 2-3 40 oz drinks, last drink Friday  . Drug use: Yes    Types: Cocaine    Comment: cocaine use once a month, last use Friday   . Sexual activity: Not Currently  Other Topics Concern  . Not on file  Social History Narrative  . Not on file   Social Determinants of Health   Financial Resource Strain: Not on file  Food Insecurity: Not on file  Transportation Needs: Not on file  Physical Activity: Not on file  Stress: Not on file  Social Connections: Not on file  Intimate Partner Violence: Not on file     Family History  Problem Relation Age of Onset  . Hypertension Mother   . Diabetes Mother   .  Hypertension Father   . Hypertension Sister     Current Outpatient Medications  Medication Sig Dispense Refill  . albuterol (PROVENTIL) (2.5 MG/3ML) 0.083% nebulizer solution Take 3 mLs (2.5 mg total) by nebulization every 6 (six) hours as needed for wheezing or shortness of breath. 150 mL 1  . albuterol (VENTOLIN HFA) 108 (90 Base) MCG/ACT inhaler Inhale 1-2 puffs into the lungs every 6 (six) hours as needed for wheezing or shortness of breath. 90 g 2  . ARIPiprazole (ABILIFY) 10 MG tablet Take 1 tablet (10 mg total) by mouth at bedtime. 30 tablet 3  . aspirin 81 MG EC tablet SMARTSIG:1 Tablet(s) By Mouth Daily    . atorvastatin (LIPITOR) 80 MG tablet Take 1 tablet (80 mg total) by mouth daily. 90 tablet 3  . cyclobenzaprine (FLEXERIL) 10 MG tablet Take 1 tablet (10 mg total) by mouth 3 (three) times daily as needed for muscle spasms. 30 tablet 0  . diltiazem (TIAZAC) 240 MG 24 hr capsule Take 240 mg by mouth daily.    . furosemide (LASIX)  20 MG tablet Take 1 tablet (20 mg total) by mouth daily. 40 tablet 2  . hydrOXYzine (ATARAX/VISTARIL) 25 MG tablet Take 1 tablet (25 mg total) by mouth 3 (three) times daily as needed for anxiety. 60 tablet 1  . latanoprost (XALATAN) 0.005 % ophthalmic solution Place 1 drop into both eyes nightly.    . SYMBICORT 160-4.5 MCG/ACT inhaler Inhale 2 puffs into the lungs in the morning and at bedtime. 10.2 g 11  . traZODone (DESYREL) 50 MG tablet Take 0.5 tablets (25 mg total) by mouth at bedtime as needed for sleep. 60 tablet 0  . Vitamin D, Ergocalciferol, (DRISDOL) 1.25 MG (50000 UNIT) CAPS capsule Take 1 capsule (50,000 Units total) by mouth once a week. 12 capsule 1   No current facility-administered medications for this visit.    Allergies  Allergen Reactions  . Shellfish Allergy Hives and Rash    REVIEW OF SYSTEMS:   [X]  denotes positive finding, [ ]  denotes negative finding Cardiac  Comments:  Chest pain or chest pressure:    Shortness of breath upon exertion:    Short of breath when lying flat:    Irregular heart rhythm:        Vascular    Pain in calf, thigh, or hip brought on by ambulation:    Pain in feet at night that wakes you up from your sleep:     Blood clot in your veins:    Leg swelling:  x       Pulmonary    Oxygen at home:    Productive cough:     Wheezing:         Neurologic    Sudden weakness in arms or legs:     Sudden numbness in arms or legs:     Sudden onset of difficulty speaking or slurred speech:    Temporary loss of vision in one eye:     Problems with dizziness:         Gastrointestinal    Blood in stool:     Vomited blood:         Genitourinary    Burning when urinating:     Blood in urine:        Psychiatric    Major depression:         Hematologic    Bleeding problems:    Problems with blood clotting too easily:  Skin    Rashes or ulcers:        Constitutional    Fever or chills:      PHYSICAL EXAMINATION:  Today's  Vitals   03/20/21 1435  BP: 115/79  Pulse: 73  Resp: 20  Temp: 97.6 F (36.4 C)  TempSrc: Temporal  SpO2: 100%  Weight: 276 lb 8 oz (125.4 kg)  Height: 6\' 4"  (1.93 m)   Body mass index is 33.66 kg/m.   General:  WDWN in NAD; vital signs documented above Gait: Not observed HENT: WNL, normocephalic Pulmonary: normal non-labored breathing without wheezing Cardiac: regular HR; without carotid bruits Abdomen: soft, NT, no masses; aortic pulse is not palpable Skin: without rashes Vascular Exam/Pulses:  Right Left  Radial 2+ (normal) 2+ (normal)  DP 2+ (normal) 2+ (normal)  PT Unable to palpate Unable to palpate   Extremities: without ischemic changes, without cellulitis; without open wounds; BLE edema-no ulcerations present Musculoskeletal: no muscle wasting or atrophy  Neurologic: A&O X 3;  moving all extremities equally Psychiatric:  The pt has Normal affect.   Non-Invasive Vascular Imaging:   Venous duplex on 03/20/2021: Venous Reflux Times  +--------------+---------+------+-----------+------------+--------+  RIGHT     Reflux NoRefluxReflux TimeDiameter cmsComments               Yes                   +--------------+---------+------+-----------+------------+--------+  CFV      no                         +--------------+---------+------+-----------+------------+--------+  FV prox    no                         +--------------+---------+------+-----------+------------+--------+  FV mid    no                         +--------------+---------+------+-----------+------------+--------+  FV dist    no                         +--------------+---------+------+-----------+------------+--------+  Popliteal   no                          +--------------+---------+------+-----------+------------+--------+  GSV at SFJ  no               0.425        +--------------+---------+------+-----------+------------+--------+  GSV prox thighno               0.438        +--------------+---------+------+-----------+------------+--------+  GSV mid thigh no               0.319        +--------------+---------+------+-----------+------------+--------+  GSV dist thighno               0.315        +--------------+---------+------+-----------+------------+--------+  GSV at knee  no               0.365        +--------------+---------+------+-----------+------------+--------+  GSV prox calf                 0.292        +--------------+---------+------+-----------+------------+--------+  GSV mid calf                 0.393        +--------------+---------+------+-----------+------------+--------+  SSV Pop Fossa no               0.306        +--------------+---------+------+-----------+------------+--------+  SSV prox calf no               0.379        +--------------+---------+------+-----------+------------+--------+  SSV mid calf                 0.347        +--------------+---------+------+-----------+------------+--------+     +--------------+---------+------+-----------+------------+--------+  LEFT     Reflux NoRefluxReflux TimeDiameter cmsComments               Yes                   +--------------+---------+------+-----------+------------+--------+  CFV      no                         +--------------+---------+------+-----------+------------+--------+  FV prox    no                          +--------------+---------+------+-----------+------------+--------+  FV mid    no                         +--------------+---------+------+-----------+------------+--------+  FV dist    no                         +--------------+---------+------+-----------+------------+--------+  Popliteal   no                         +--------------+---------+------+-----------+------------+--------+  GSV at [facility]  no               0.347        +--------------+---------+------+-----------+------------+--------+  GSV prox thighno               0.286        +--------------+---------+------+-----------+------------+--------+  GSV mid thigh no               0.346        +--------------+---------+------+-----------+------------+--------+  GSV dist thighno               0.315        +--------------+---------+------+-----------+------------+--------+  GSV at knee  no               0.278        +--------------+---------+------+-----------+------------+--------+  GSV prox calf                 0.288        +--------------+---------+------+-----------+------------+--------+  GSV mid calf                 0.320        +--------------+---------+------+-----------+------------+--------+  SSV Pop Fossa no               0.479        +--------------+---------+------+-----------+------------+--------+  SSV prox calf no               0.283        +--------------+---------+------+-----------+------------+--------+  SSV mid calf                 0.251        +--------------+---------+------+-----------+------------+--------+   Summary:  Bilateral:  - No evidence  of deep vein thrombosis seen in the lower extremities,  bilaterally, from the common  femoral through the popliteal veins.  - No evidence of superficial venous thrombosis in the lower extremities,  bilaterally.  - No evidence of deep venous insufficiency seen bilaterally in the lower  extremity.  - No evidence of superficial venous reflux seen in the greater saphenous  veins bilaterally.  - No evidence of superficial venous reflux seen in the short saphenous  veins bilaterally.  ABI/TBI 02/06/2021: Right:  1.10/0.91 Left:  1.11/0.87  Robert Chan is a 65 y.o. male who presents with: BLE heaviness and swelling  -pt's duplex today is negative for bilateral DVT, bilateral superficial or deep venous reflux.  Venous reflux is not the reason for his leg swelling. Discussed with him he would benefit from compression and elevation.  His PCP can continue workup for his leg swelling.  Dr. Radford Pax discussed restricting his salt intake.  She also recommended repeating echo to make sure his LVF had not changed.  In March, he had a slightly elevated creatinine-may be beneficial to recheck his labs.   -discussed with pt about wearing knee high 15-50mmHg compression stockings.  He was measured today and given his size.   -discussed the importance of leg elevation and how to elevate properly - pt is advised to elevate their legs and a diagram is given to them to demonstrate to lay flat on their back with knees elevated and slightly bent with their feet higher than her knees, which puts their feet higher than their heart for 15 minutes per day.  If they cannot lay flat, advised to lay as flat as possible.  -pt is advised to continue as much walking as possible and avoid sitting or standing for long periods of time.  -discussed importance of weight loss and exercise and that water aerobics would also be beneficial.  He does [facility] a membership at the [facility].  -handout with recommendations given -pt will f/u as  needed   Leontine Locket, [facility] 03/11/2021 1:00 PM  Clinic MD:  Donzetta Matters

## 2021-03-20 ENCOUNTER — Ambulatory Visit (INDEPENDENT_AMBULATORY_CARE_PROVIDER_SITE_OTHER): Payer: Medicare HMO | Admitting: Physician Assistant

## 2021-03-20 ENCOUNTER — Other Ambulatory Visit: Payer: Self-pay

## 2021-03-20 ENCOUNTER — Ambulatory Visit (HOSPITAL_COMMUNITY)
Admission: RE | Admit: 2021-03-20 | Discharge: 2021-03-20 | Disposition: A | Discharge status: Home / Self Care | Payer: Medicare HMO | Source: Ambulatory Visit | Attending: Vascular Surgery | Admitting: Vascular Surgery

## 2021-03-20 VITALS — BP 115/79 | HR 73 | Temp 97.6°F | Resp 20 | Ht 76.0 in | Wt 276.5 lb

## 2021-03-20 DIAGNOSIS — R600 Localized edema: Secondary | ICD-10-CM

## 2021-04-01 ENCOUNTER — Encounter: Payer: Self-pay | Admitting: Podiatry

## 2021-04-01 ENCOUNTER — Ambulatory Visit (INDEPENDENT_AMBULATORY_CARE_PROVIDER_SITE_OTHER): Payer: Medicare HMO | Admitting: Podiatry

## 2021-04-01 ENCOUNTER — Other Ambulatory Visit: Payer: Self-pay

## 2021-04-01 DIAGNOSIS — B351 Tinea unguium: Secondary | ICD-10-CM | POA: Diagnosis not present

## 2021-04-01 DIAGNOSIS — M79674 Pain in right toe(s): Secondary | ICD-10-CM

## 2021-04-01 DIAGNOSIS — M79675 Pain in left toe(s): Secondary | ICD-10-CM | POA: Diagnosis not present

## 2021-04-01 DIAGNOSIS — L989 Disorder of the skin and subcutaneous tissue, unspecified: Secondary | ICD-10-CM

## 2021-04-08 ENCOUNTER — Other Ambulatory Visit: Payer: Self-pay

## 2021-04-08 ENCOUNTER — Ambulatory Visit: Payer: Medicare HMO | Attending: Family Medicine | Admitting: Family Medicine

## 2021-04-08 VITALS — BP 140/82 | HR 78 | Resp 17 | Ht 73.0 in | Wt 283.6 lb

## 2021-04-08 DIAGNOSIS — F17210 Nicotine dependence, cigarettes, uncomplicated: Secondary | ICD-10-CM

## 2021-04-08 DIAGNOSIS — I2510 Atherosclerotic heart disease of native coronary artery without angina pectoris: Secondary | ICD-10-CM

## 2021-04-08 DIAGNOSIS — I2583 Coronary atherosclerosis due to lipid rich plaque: Secondary | ICD-10-CM | POA: Diagnosis not present

## 2021-04-08 DIAGNOSIS — I739 Peripheral vascular disease, unspecified: Secondary | ICD-10-CM

## 2021-04-08 DIAGNOSIS — Z0001 Encounter for general adult medical examination with abnormal findings: Secondary | ICD-10-CM

## 2021-04-08 DIAGNOSIS — R7989 Other specified abnormal findings of blood chemistry: Secondary | ICD-10-CM

## 2021-04-08 DIAGNOSIS — F332 Major depressive disorder, recurrent severe without psychotic features: Secondary | ICD-10-CM

## 2021-04-08 DIAGNOSIS — R69 Illness, unspecified: Secondary | ICD-10-CM | POA: Diagnosis not present

## 2021-04-08 NOTE — Patient Instructions (Signed)
    Robert Chan , Thank you for taking time to come for your Medicare Wellness Visit. I appreciate your ongoing commitment to your health goals. Please review the following plan we discussed and let me know if I can assist you in the future.   These are the goals we discussed: Continue make efforts to achieve goal weight of 250 Return stool card for colons cancer screening I have attached information for [name] if your depressive symptoms worsen     This is a list of the screening recommended for you and due dates:  Health Maintenance  Topic Date Due  . Pneumococcal Vaccination (1 of 2 - PPSV23) Never done  . Stool Blood Test  Never done  . Zoster (Shingles) Vaccine (1 of 2) Never done  . Tetanus Vaccine  12/25/2021*  . Pneumonia vaccines (1 of 2 - PCV13) 12/25/2021*  . Flu Shot  06/01/2021  . COVID-19 Vaccine  Completed  . Hepatitis C Screening: USPSTF Recommendation to screen - Ages 28-79 yo.  Completed  . HPV Vaccine  Aged Out  . Colon Cancer Screening  Discontinued  *Topic was postponed. The date shown is not the original due date.

## 2021-04-12 ENCOUNTER — Encounter (HOSPITAL_COMMUNITY): Payer: Self-pay | Admitting: Pharmacy Technician

## 2021-04-12 ENCOUNTER — Other Ambulatory Visit: Payer: Self-pay

## 2021-04-12 ENCOUNTER — Emergency Department (HOSPITAL_COMMUNITY): Payer: Medicare HMO

## 2021-04-12 ENCOUNTER — Emergency Department (HOSPITAL_COMMUNITY)
Admission: EM | Admit: 2021-04-12 | Discharge: 2021-04-12 | Disposition: A | Discharge status: Home / Self Care | Payer: Medicare HMO | Attending: Emergency Medicine | Admitting: Emergency Medicine

## 2021-04-12 DIAGNOSIS — R609 Edema, unspecified: Secondary | ICD-10-CM | POA: Diagnosis present

## 2021-04-12 DIAGNOSIS — Z87891 Personal history of nicotine dependence: Secondary | ICD-10-CM | POA: Insufficient documentation

## 2021-04-12 DIAGNOSIS — N189 Chronic kidney disease, unspecified: Secondary | ICD-10-CM | POA: Diagnosis not present

## 2021-04-12 DIAGNOSIS — Z79899 Other long term (current) drug therapy: Secondary | ICD-10-CM | POA: Diagnosis not present

## 2021-04-12 DIAGNOSIS — I10 Essential (primary) hypertension: Secondary | ICD-10-CM | POA: Diagnosis not present

## 2021-04-12 DIAGNOSIS — Z9861 Coronary angioplasty status: Secondary | ICD-10-CM | POA: Diagnosis not present

## 2021-04-12 DIAGNOSIS — Z7952 Long term (current) use of systemic steroids: Secondary | ICD-10-CM | POA: Diagnosis not present

## 2021-04-12 DIAGNOSIS — I252 Old myocardial infarction: Secondary | ICD-10-CM | POA: Insufficient documentation

## 2021-04-12 DIAGNOSIS — M549 Dorsalgia, unspecified: Secondary | ICD-10-CM | POA: Diagnosis not present

## 2021-04-12 DIAGNOSIS — Z7982 Long term (current) use of aspirin: Secondary | ICD-10-CM | POA: Insufficient documentation

## 2021-04-12 DIAGNOSIS — Z7901 Long term (current) use of anticoagulants: Secondary | ICD-10-CM | POA: Insufficient documentation

## 2021-04-12 DIAGNOSIS — R600 Localized edema: Secondary | ICD-10-CM | POA: Diagnosis not present

## 2021-04-12 DIAGNOSIS — I129 Hypertensive chronic kidney disease with stage 1 through stage 4 chronic kidney disease, or unspecified chronic kidney disease: Secondary | ICD-10-CM | POA: Diagnosis not present

## 2021-04-12 DIAGNOSIS — I2510 Atherosclerotic heart disease of native coronary artery without angina pectoris: Secondary | ICD-10-CM | POA: Diagnosis not present

## 2021-04-12 DIAGNOSIS — J45909 Unspecified asthma, uncomplicated: Secondary | ICD-10-CM | POA: Diagnosis not present

## 2021-04-12 DIAGNOSIS — J449 Chronic obstructive pulmonary disease, unspecified: Secondary | ICD-10-CM | POA: Insufficient documentation

## 2021-04-12 LAB — BASIC METABOLIC PANEL
Anion gap: 8 (ref 5–15)
BUN: 13 mg/dL (ref 8–23)
CO2: 22 mmol/L (ref 22–32)
Calcium: 8.9 mg/dL (ref 8.9–10.3)
Chloride: 108 mmol/L (ref 98–111)
Creatinine, Ser: 1.29 mg/dL — ABNORMAL HIGH (ref 0.61–1.24)
GFR, Estimated: >60 mL/min (ref >60)
Glucose, Bld: 105 mg/dL — ABNORMAL HIGH (ref 70–99)
Potassium: 3.9 mmol/L (ref 3.5–5.1)
Sodium: 138 mmol/L (ref 135–145)

## 2021-04-12 LAB — CBC
HCT: 39.7 % (ref 39.0–52.0)
Hemoglobin: 12.6 g/dL — ABNORMAL LOW (ref 13.0–17.0)
MCH: 29.7 pg (ref 26.0–34.0)
MCHC: 31.7 g/dL (ref 30.0–36.0)
MCV: 93.6 fL (ref 80.0–100.0)
Platelets: 281 10*3/uL (ref 150–400)
RBC: 4.24 MIL/uL (ref 4.22–5.81)
RDW: 12.8 % (ref 11.5–15.5)
WBC: 8.2 10*3/uL (ref 4.0–10.5)
nRBC: 0 % (ref 0.0–0.2)

## 2021-04-12 MED ORDER — FUROSEMIDE 20 MG PO TABS: 20.0000 mg | ORAL_TABLET | Freq: Every day | ORAL | 0 refills | Status: DC

## 2021-04-12 MED ORDER — FUROSEMIDE 10 MG/ML IJ SOLN
40.0000 mg | Freq: Once | INTRAMUSCULAR | Status: AC
  Administered 2021-04-12: 40 mg via INTRAMUSCULAR

## 2021-04-12 MED ORDER — FUROSEMIDE 10 MG/ML IJ SOLN
40.0000 mg | Freq: Once | INTRAMUSCULAR | Status: DC
  Filled 2021-04-12: qty 4

## 2021-04-12 MED ORDER — PIMECROLIMUS 1 % EX CREA: TOPICAL_CREAM | Freq: Two times a day (BID) | CUTANEOUS | 0 refills | Status: DC

## 2021-04-12 NOTE — Progress Notes (Signed)
    Subjective: Patient is a 65 y.o. male presenting to the office today with a chief complaint of painful callus lesion(s) noted to the left foot has been present for several months Patient also complains of elongated, thickened nails that cause pain while ambulating in shoes.  He is unable to trim his own nails. Patient presents today for further treatment and evaluation.  Past Medical History:  Diagnosis Date   Asthma    CAD (coronary artery disease)    Cataract    CKD (chronic kidney disease)    COPD (chronic obstructive pulmonary disease) ([facility])    Depression    Hypertension    NSTEMI (non-ST elevated myocardial infarction) ([place])    Suicidal ideation 06/05/2020    Objective:  Physical Exam General: Alert and oriented x3 in no acute distress  Dermatology: Hyperkeratotic lesion(s) present on the left foot. Pain on palpation with a central nucleated core noted. Skin is warm, dry and supple bilateral lower extremities. Negative for open lesions or macerations. Nails are tender, long, thickened and dystrophic with subungual debris, consistent with onychomycosis, 1-5 bilateral. No signs of infection noted.  Vascular: Palpable pedal pulses bilaterally. No edema or erythema noted. Capillary refill within normal limits.  Neurological: Epicritic and protective threshold grossly intact bilaterally.   Musculoskeletal Exam: Pain on palpation at the keratotic lesion(s) noted. Range of motion within normal limits bilateral. Muscle strength 5/5 in all groups bilateral.  Assessment: 1. Onychodystrophic nails 1-5 bilateral with hyperkeratosis of nails.  2. Onychomycosis of nail due to dermatophyte bilateral 3.  Preulcerative callus lesion to the subfifth MTPJ left foot   Plan of Care:  1. Patient evaluated. 2. Excisional debridement of keratoic lesion(s) using a chisel blade was performed without incident.  3. Dressed with light dressing. 4. Mechanical debridement of nails 1-5 bilaterally  performed using a nail nipper. Filed with dremel without incident.  5. Patient is to return to the clinic in 3 months.   Edrick Kins, DPM [facility]  Dr. Edrick Kins, DPM    [address], [place] [address]                Office [phone]  Fax [phone]

## 2021-04-12 NOTE — ED Triage Notes (Signed)
Pt here with reports of bil lower extremity edema X1 month. Pt reports taking fluid pill without relief. Pt also complaining of back pain intermittently from "getting old".

## 2021-04-12 NOTE — ED Provider Notes (Signed)
[facility] EMERGENCY DEPARTMENT Provider Note   CSN: [id number] Arrival date & time: 04/12/21  1337     History Chief Complaint  Patient presents with   Leg Swelling    Robert Chan is a 65 y.o. male.  Pt presents to the ED today with leg swelling.  Pt said he has had leg swelling for several months.  He did see his pcp who referred her to vascular.  He saw them and recommended wearing compression stockings.  The pt has not gotten them yet.  They told her to restrict salt and exercise.  Pt said he has not been exercising because his back hurts and his legs feel heavy.  Pt was on lasix, but stopped taking it because he felt it was not helping.  Pt also wants some lotion for his dry skin on his legs.       Past Medical History:  Diagnosis Date   Asthma    CAD (coronary artery disease)    Cataract    CKD (chronic kidney disease)    COPD (chronic obstructive pulmonary disease) (HCC)    Depression    Hypertension    NSTEMI (non-ST elevated myocardial infarction) ([place])    Suicidal ideation 06/05/2020    Patient Active Problem List   Diagnosis Date Noted   Bilateral lower extremity edema 02/27/2021   Acute bilateral low back pain without sciatica 02/27/2021   PAD (peripheral artery disease) ([place]) 12/25/2020   Coronary vasospasm ([place]) 10/07/2020   Cigarette smoker    MDD (major depressive disorder), recurrent episode, severe ([place]) 09/08/2020   History of cocaine dependence ([place])    Vitamin D deficiency 06/23/2020   Coronary artery disease 06/19/2020   Homelessness 06/19/2020   History of non-ST elevation myocardial infarction (NSTEMI)    Foot callus 12/12/2019   Cocaine use disorder, mild, abuse (HCC)    Elevated serum creatinine 08/28/2019   Age-related nuclear cataract of right eye 09/13/2016   Benign hypertension 07/06/2016   Glaucoma 07/06/2016   Male erectile disorder 07/06/2016   Asthma 10/18/2012   COPD (chronic obstructive pulmonary disease)  ([place]) 10/18/2012    Past Surgical History:  Procedure Laterality Date   APPENDECTOMY     EYE SURGERY     LEFT HEART CATH AND CORONARY ANGIOGRAPHY N/A 06/05/2020   Procedure: LEFT HEART CATH AND CORONARY ANGIOGRAPHY;  Surgeon: Nigel Mormon, MD;  Location: [facility];  Service: Cardiovascular;  Laterality: N/A;   PROSTATE SURGERY         Family History  Problem Relation Age of Onset   Hypertension Mother    Diabetes Mother    Hypertension Father    Hypertension Sister     Social History   Tobacco Use   Smoking status: Former    Pack years: 0.00    Types: Cigarettes    Quit date: 12/25/2020    Years since quitting: 0.2   Smokeless tobacco: Never   Tobacco comments:    smokes 3-4 cigarettes/day  Vaping Use   Vaping Use: Never used  Substance Use Topics   Alcohol use: Yes    Comment: drinks 2x/wk, 2-3 40 oz drinks, last drink Friday   Drug use: Yes    Types: Cocaine    Comment: cocaine use once a month, last use Friday     Home Medications Prior to Admission medications   Medication Sig Start Date End Date Taking? Authorizing Provider  pimecrolimus (ELIDEL) 1 % cream Apply topically 2 (two) times  daily. 04/12/21  Yes Isla Pence, MD  albuterol (PROVENTIL) (2.5 MG/3ML) 0.083% nebulizer solution Take 3 mLs (2.5 mg total) by nebulization every 6 (six) hours as needed for wheezing or shortness of breath. Patient not taking: Reported on 04/08/2021 09/29/20   Elsie Stain, MD  albuterol (VENTOLIN HFA) 108 (90 Base) MCG/ACT inhaler Inhale 1-2 puffs into the lungs every 6 (six) hours as needed for wheezing or shortness of breath. 06/19/20   Elsie Stain, MD  ARIPiprazole (ABILIFY) 10 MG tablet Take 1 tablet (10 mg total) by mouth at bedtime. 12/25/20   Elsie Stain, MD  aspirin 81 MG EC tablet SMARTSIG:1 Tablet(s) By Mouth Daily 01/13/21   [provider]  atorvastatin (LIPITOR) 80 MG tablet Take 1 tablet (80 mg total) by mouth daily. 01/05/21    Sueanne Margarita, MD  buPROPion (WELLBUTRIN XL) 150 MG 24 hr tablet Take 1 tablet by mouth every morning. 03/01/21   [provider]  cyclobenzaprine (FLEXERIL) 10 MG tablet Take 1 tablet (10 mg total) by mouth 3 (three) times daily as needed for muscle spasms. 03/05/21   Mayers, Cari S, PA-C  diltiazem (TIAZAC) 240 MG 24 hr capsule Take 240 mg by mouth daily. 01/05/21   [provider]  furosemide (LASIX) 20 MG tablet Take 1 tablet (20 mg total) by mouth daily. 04/12/21 05/12/21  Isla Pence, MD  hydrOXYzine (ATARAX/VISTARIL) 25 MG tablet Take 1 tablet (25 mg total) by mouth 3 (three) times daily as needed for anxiety. 12/25/20   Elsie Stain, MD  latanoprost (XALATAN) 0.005 % ophthalmic solution Place 1 drop into both eyes nightly. 01/15/21   [provider]  SYMBICORT 160-4.5 MCG/ACT inhaler Inhale 2 puffs into the lungs in the morning and at bedtime. 09/29/20   Elsie Stain, MD  traZODone (DESYREL) 50 MG tablet Take 0.5 tablets (25 mg total) by mouth at bedtime as needed for sleep. 12/25/20   Elsie Stain, MD  valsartan-hydrochlorothiazide (DIOVAN-HCT) 160-25 MG tablet Take 1 tablet by mouth daily. 03/29/21   [provider]  Vitamin D, Ergocalciferol, (DRISDOL) 1.25 MG (50000 UNIT) CAPS capsule Take 1 capsule (50,000 Units total) by mouth once a week. 12/25/20   Elsie Stain, MD  amLODipine (NORVASC) 10 MG tablet Take 1 tablet (10 mg total) by mouth daily. 12/25/20 01/05/21  Elsie Stain, MD    Allergies    Shellfish allergy  Review of Systems   Review of Systems  Cardiovascular:  Positive for leg swelling.  All other systems reviewed and are negative.  Physical Exam Updated Vital Signs BP 134/65   Pulse 68   Temp 98.3 F (36.8 C)   Resp 16   SpO2 97%   Physical Exam Vitals and nursing note reviewed.  Constitutional:      Appearance: Normal appearance.  HENT:     Head: Normocephalic and atraumatic.     Right Ear: External ear  normal.     Left Ear: External ear normal.     Nose: Nose normal.     Mouth/Throat:     Mouth: Mucous membranes are moist.     Pharynx: Oropharynx is clear.  Eyes:     Extraocular Movements: Extraocular movements intact.     Conjunctiva/sclera: Conjunctivae normal.     Pupils: Pupils are equal, round, and reactive to light.  Cardiovascular:     Rate and Rhythm: Normal rate and regular rhythm.     Pulses: Normal pulses.  Heart sounds: Normal heart sounds.  Pulmonary:     Effort: Pulmonary effort is normal.     Breath sounds: Normal breath sounds.  Abdominal:     General: Abdomen is flat. Bowel sounds are normal.     Palpations: Abdomen is soft.  Musculoskeletal:     Cervical back: Normal range of motion and neck supple.     Right lower leg: Edema present.     Left lower leg: Edema present.  Skin:    General: Skin is warm.     Capillary Refill: Capillary refill takes less than 2 seconds.     Comments: Dry skin bilateral lower extremities  Neurological:     General: No focal deficit present.     Mental Status: He is alert and oriented to person, place, and time.  Psychiatric:        Mood and Affect: Mood normal.        Behavior: Behavior normal.        Thought Content: Thought content normal.        Judgment: Judgment normal.    ED Results / Procedures / Treatments   Labs (all labs ordered are listed, but only abnormal results are displayed) Labs Reviewed  BASIC METABOLIC PANEL - Abnormal; Notable for the following components:      Result Value   Glucose, Bld 105 (*)    Creatinine, Ser 1.29 (*)    All other components within normal limits  CBC - Abnormal; Notable for the following components:   Hemoglobin 12.6 (*)    All other components within normal limits    EKG None  Radiology DG Chest 2 View  Result Date: 04/12/2021 CLINICAL DATA:  Bilateral lower extremity edema 1 month as well as back pain. EXAM: CHEST - 2 VIEW COMPARISON:  12/03/2020 FINDINGS: Lungs  are adequately inflated without focal airspace consolidation or effusion. Cardiomediastinal silhouette and remainder of the exam is unchanged. IMPRESSION: No active cardiopulmonary disease. Electronically Signed   By: Marin Olp M.D.   On: 04/12/2021 14:48    Procedures Procedures   Medications Ordered in ED Medications  furosemide (LASIX) injection 40 mg (40 mg Intramuscular Given 04/12/21 1856)    ED Course  I have reviewed the triage vital signs and the nursing notes.  Pertinent labs & imaging results that were available during my care of the patient were reviewed by me and considered in my medical decision making (see chart for details).    MDM Rules/Calculators/A&P                          Pt had good UOP with IM lasix.  He is to start taking his lasix again and he is to obtain the compression hose.  He was measured at vascular.  He is to call their office to see if they ordered them or if he needs to order them.  Pt is stable for d/c.  Return if worse. Final Clinical Impression(s) / ED Diagnoses Final diagnoses:  Peripheral edema    Rx / DC Orders ED Discharge Orders          Ordered    furosemide (LASIX) 20 MG tablet  Daily        04/12/21 1953    pimecrolimus (ELIDEL) 1 % cream  2 times daily        04/12/21 1954             Isla Pence, MD 04/12/21 1956

## 2021-04-12 NOTE — Discharge Instructions (Addendum)
Call the Vascular office and see if they ordered your compression hose or if you need to order them.

## 2021-04-15 ENCOUNTER — Emergency Department (HOSPITAL_COMMUNITY): Payer: Medicare HMO

## 2021-04-15 ENCOUNTER — Encounter: Payer: Self-pay | Admitting: Family Medicine

## 2021-04-15 ENCOUNTER — Emergency Department (HOSPITAL_COMMUNITY)
Admission: EM | Admit: 2021-04-15 | Discharge: 2021-04-16 | Disposition: A | Discharge status: Home / Self Care | Payer: Medicare HMO | Attending: Emergency Medicine | Admitting: Emergency Medicine

## 2021-04-15 ENCOUNTER — Encounter (HOSPITAL_COMMUNITY): Payer: Self-pay

## 2021-04-15 ENCOUNTER — Other Ambulatory Visit: Payer: Self-pay

## 2021-04-15 DIAGNOSIS — Z87891 Personal history of nicotine dependence: Secondary | ICD-10-CM | POA: Diagnosis not present

## 2021-04-15 DIAGNOSIS — R600 Localized edema: Secondary | ICD-10-CM | POA: Diagnosis not present

## 2021-04-15 DIAGNOSIS — R0789 Other chest pain: Secondary | ICD-10-CM | POA: Diagnosis not present

## 2021-04-15 DIAGNOSIS — J449 Chronic obstructive pulmonary disease, unspecified: Secondary | ICD-10-CM | POA: Diagnosis not present

## 2021-04-15 DIAGNOSIS — Z7982 Long term (current) use of aspirin: Secondary | ICD-10-CM | POA: Diagnosis not present

## 2021-04-15 DIAGNOSIS — N189 Chronic kidney disease, unspecified: Secondary | ICD-10-CM | POA: Diagnosis not present

## 2021-04-15 DIAGNOSIS — J45909 Unspecified asthma, uncomplicated: Secondary | ICD-10-CM | POA: Insufficient documentation

## 2021-04-15 DIAGNOSIS — L0291 Cutaneous abscess, unspecified: Secondary | ICD-10-CM

## 2021-04-15 DIAGNOSIS — I129 Hypertensive chronic kidney disease with stage 1 through stage 4 chronic kidney disease, or unspecified chronic kidney disease: Secondary | ICD-10-CM | POA: Insufficient documentation

## 2021-04-15 DIAGNOSIS — R072 Precordial pain: Secondary | ICD-10-CM | POA: Diagnosis not present

## 2021-04-15 DIAGNOSIS — I2510 Atherosclerotic heart disease of native coronary artery without angina pectoris: Secondary | ICD-10-CM | POA: Insufficient documentation

## 2021-04-15 DIAGNOSIS — R0602 Shortness of breath: Secondary | ICD-10-CM | POA: Diagnosis not present

## 2021-04-15 DIAGNOSIS — Z79899 Other long term (current) drug therapy: Secondary | ICD-10-CM | POA: Diagnosis not present

## 2021-04-15 DIAGNOSIS — R079 Chest pain, unspecified: Secondary | ICD-10-CM | POA: Diagnosis not present

## 2021-04-15 DIAGNOSIS — L02212 Cutaneous abscess of back [any part, except buttock]: Secondary | ICD-10-CM | POA: Insufficient documentation

## 2021-04-15 LAB — BASIC METABOLIC PANEL
Anion gap: 8 (ref 5–15)
BUN: 15 mg/dL (ref 8–23)
CO2: 24 mmol/L (ref 22–32)
Calcium: 9 mg/dL (ref 8.9–10.3)
Chloride: 108 mmol/L (ref 98–111)
Creatinine, Ser: 1.56 mg/dL — ABNORMAL HIGH (ref 0.61–1.24)
GFR, Estimated: 49 mL/min — ABNORMAL LOW (ref >60)
Glucose, Bld: 112 mg/dL — ABNORMAL HIGH (ref 70–99)
Potassium: 3.5 mmol/L (ref 3.5–5.1)
Sodium: 140 mmol/L (ref 135–145)

## 2021-04-15 LAB — CBC WITH DIFFERENTIAL/PLATELET
Abs Immature Granulocytes: 0.04 10*3/uL (ref 0.00–0.07)
Basophils Absolute: 0.1 10*3/uL (ref 0.0–0.1)
Basophils Relative: 1 %
Eosinophils Absolute: 0.5 10*3/uL (ref 0.0–0.5)
Eosinophils Relative: 4 %
HCT: 37.9 % — ABNORMAL LOW (ref 39.0–52.0)
Hemoglobin: 12.2 g/dL — ABNORMAL LOW (ref 13.0–17.0)
Immature Granulocytes: 0 %
Lymphocytes Relative: 27 %
Lymphs Abs: 3 10*3/uL (ref 0.7–4.0)
MCH: 30.1 pg (ref 26.0–34.0)
MCHC: 32.2 g/dL (ref 30.0–36.0)
MCV: 93.6 fL (ref 80.0–100.0)
Monocytes Absolute: 0.9 10*3/uL (ref 0.1–1.0)
Monocytes Relative: 8 %
Neutro Abs: 6.4 10*3/uL (ref 1.7–7.7)
Neutrophils Relative %: 60 %
Platelets: 262 10*3/uL (ref 150–400)
RBC: 4.05 MIL/uL — ABNORMAL LOW (ref 4.22–5.81)
RDW: 13 % (ref 11.5–15.5)
WBC: 10.8 10*3/uL — ABNORMAL HIGH (ref 4.0–10.5)
nRBC: 0 % (ref 0.0–0.2)

## 2021-04-15 LAB — TROPONIN I (HIGH SENSITIVITY): Troponin I (High Sensitivity): 9 ng/L (ref <18)

## 2021-04-15 MED ORDER — ALUM & MAG HYDROXIDE-SIMETH 200-200-20 MG/5ML PO SUSP
30.0000 mL | Freq: Once | ORAL | Status: AC
  Administered 2021-04-15: 30 mL via ORAL
  Filled 2021-04-15: qty 30

## 2021-04-15 MED ORDER — ACETAMINOPHEN 500 MG PO TABS
1000.0000 mg | ORAL_TABLET | Freq: Once | ORAL | Status: AC
  Administered 2021-04-15: 1000 mg via ORAL
  Filled 2021-04-15: qty 2

## 2021-04-15 MED ORDER — FAMOTIDINE 20 MG PO TABS
20.0000 mg | ORAL_TABLET | Freq: Once | ORAL | Status: AC
  Administered 2021-04-15: 20 mg via ORAL
  Filled 2021-04-15: qty 1

## 2021-04-15 NOTE — ED Provider Notes (Addendum)
[facility] DEPT Provider Note   CSN: [id number] Arrival date & time: 04/15/21  1748     History Chief Complaint  Patient presents with   Abscess   Chest Pain    Robert Chan is a 65 y.o. male.  Patient c/o abscess to upper back for past few days, symptoms acute onset, moderate, dull, constant, persistent. Denies bite or sting to area. No injury recalled. No hx abscesses or mrsa. Denies fever or chills. Also notes chest discomfort, ?indigestion, mid chest since last night, constant, non radiating, not pleuritic, at rest. No associated nv, diaphoresis or sob. No unusual fatigue or doe. Denies any other recent cp or discomfort, even w exertion. No leg pain or unilateral swelling.  No hemoptysis. No recent surgery, immobility, trauma or travel. No hx dvt or pe. ?hx gerd. Notes recent eval for dry, scaly skin on legs, not yet using cream recently prescribed - no acute worsening.   The history is provided by the patient.  Rash Associated symptoms: no abdominal pain, no fever, no headaches, no nausea, no shortness of breath, no sore throat and not vomiting       Past Medical History:  Diagnosis Date   Asthma    CAD (coronary artery disease)    Cataract    CKD (chronic kidney disease)    COPD (chronic obstructive pulmonary disease) ([place])    Depression    Hypertension    NSTEMI (non-ST elevated myocardial infarction) ([place])    Suicidal ideation 06/05/2020    Patient Active Problem List   Diagnosis Date Noted   Bilateral lower extremity edema 02/27/2021   Acute bilateral low back pain without sciatica 02/27/2021   PAD (peripheral artery disease) ([place]) 12/25/2020   Coronary vasospasm ([place]) 10/07/2020   Cigarette smoker    MDD (major depressive disorder), recurrent episode, severe ([place]) 09/08/2020   Vitamin D deficiency 06/23/2020   Coronary artery disease 06/19/2020   Homelessness 06/19/2020   History of non-ST elevation myocardial infarction  (NSTEMI)    Foot callus 12/12/2019   Cocaine use disorder, mild, abuse (H[facility])    Elevated serum creatinine 08/28/2019   Age-related nuclear cataract of right eye 09/13/2016   Benign hypertension 07/06/2016   Glaucoma 07/06/2016   Male erectile disorder 07/06/2016   Asthma 10/18/2012   COPD (chronic obstructive pulmonary disease) ([place]) 10/18/2012    Past Surgical History:  Procedure Laterality Date   APPENDECTOMY     EYE SURGERY     LEFT HEART CATH AND CORONARY ANGIOGRAPHY N/A 06/05/2020   Procedure: LEFT HEART CATH AND CORONARY ANGIOGRAPHY;  Surgeon: Nigel Mormon, MD;  Location: [facility];  Service: Cardiovascular;  Laterality: N/A;   PROSTATE SURGERY         Family History  Problem Relation Age of Onset   Hypertension Mother    Diabetes Mother    Hypertension Father    Hypertension Sister     Social History   Tobacco Use   Smoking status: Former    Pack years: 0.00    Types: Cigarettes    Quit date: 12/25/2020    Years since quitting: 0.3   Smokeless tobacco: Never   Tobacco comments:    smokes 3-4 cigarettes/day  Vaping Use   Vaping Use: Never used  Substance Use Topics   Alcohol use: Yes    Comment: drinks 2x/wk, 2-3 40 oz drinks, last drink Friday   Drug use: Yes    Types: Cocaine    Comment: cocaine use  once a month, last use Friday     Home Medications Prior to Admission medications   Medication Sig Start Date End Date Taking? Authorizing Provider  albuterol (PROVENTIL) (2.5 MG/3ML) 0.083% nebulizer solution Take 3 mLs (2.5 mg total) by nebulization every 6 (six) hours as needed for wheezing or shortness of breath. Patient not taking: Reported on 04/08/2021 09/29/20   Elsie Stain, MD  albuterol (VENTOLIN HFA) 108 (90 Base) MCG/ACT inhaler Inhale 1-2 puffs into the lungs every 6 (six) hours as needed for wheezing or shortness of breath. 06/19/20   Elsie Stain, MD  ARIPiprazole (ABILIFY) 10 MG tablet Take 1 tablet (10 mg total) by  mouth at bedtime. 12/25/20   Elsie Stain, MD  aspirin 81 MG EC tablet SMARTSIG:1 Tablet(s) By Mouth Daily 01/13/21   [provider]  atorvastatin (LIPITOR) 80 MG tablet Take 1 tablet (80 mg total) by mouth daily. 01/05/21   Sueanne Margarita, MD  buPROPion (WELLBUTRIN XL) 150 MG 24 hr tablet Take 1 tablet by mouth every morning. 03/01/21   [provider]  cyclobenzaprine (FLEXERIL) 10 MG tablet Take 1 tablet (10 mg total) by mouth 3 (three) times daily as needed for muscle spasms. 03/05/21   Mayers, Cari S, PA-C  diltiazem (TIAZAC) 240 MG 24 hr capsule Take 240 mg by mouth daily. 01/05/21   [provider]  furosemide (LASIX) 20 MG tablet Take 1 tablet (20 mg total) by mouth daily. 04/12/21 05/12/21  Isla Pence, MD  hydrOXYzine (ATARAX/VISTARIL) 25 MG tablet Take 1 tablet (25 mg total) by mouth 3 (three) times daily as needed for anxiety. 12/25/20   Elsie Stain, MD  latanoprost (XALATAN) 0.005 % ophthalmic solution Place 1 drop into both eyes nightly. 01/15/21   [provider]  pimecrolimus (ELIDEL) 1 % cream Apply topically 2 (two) times daily. 04/12/21   Isla Pence, MD  SYMBICORT 160-4.5 MCG/ACT inhaler Inhale 2 puffs into the lungs in the morning and at bedtime. 09/29/20   Elsie Stain, MD  traZODone (DESYREL) 50 MG tablet Take 0.5 tablets (25 mg total) by mouth at bedtime as needed for sleep. 12/25/20   Elsie Stain, MD  valsartan-hydrochlorothiazide (DIOVAN-HCT) 160-25 MG tablet Take 1 tablet by mouth daily. 03/29/21   [provider]  Vitamin D, Ergocalciferol, (DRISDOL) 1.25 MG (50000 UNIT) CAPS capsule Take 1 capsule (50,000 Units total) by mouth once a week. 12/25/20   Elsie Stain, MD  amLODipine (NORVASC) 10 MG tablet Take 1 tablet (10 mg total) by mouth daily. 12/25/20 01/05/21  Elsie Stain, MD    Allergies    Shellfish allergy  Review of Systems   Review of Systems  Constitutional:  Negative for chills and fever.   HENT:  Negative for sore throat.   Eyes:  Negative for pain and visual disturbance.  Respiratory:  Negative for cough and shortness of breath.   Cardiovascular:  Positive for chest pain. Negative for palpitations.  Gastrointestinal:  Negative for abdominal pain, nausea and vomiting.  Genitourinary:  Negative for dysuria and flank pain.  Musculoskeletal:  Negative for back pain and neck pain.  Skin:        Abscess upper back area.   Neurological:  Negative for headaches.  Hematological:  Does not bruise/bleed easily.  Psychiatric/Behavioral:  Negative for confusion.    Physical Exam Updated Vital Signs BP 131/84   Pulse 65   Temp 98 F (36.7 C)   Resp 17   Ht  1.854 m (6\' 1" )   Wt 127 kg   SpO2 97%   BMI 36.94 kg/m   Physical Exam Vitals and nursing note reviewed.  Constitutional:      Appearance: Normal appearance. He is well-developed.  HENT:     Head: Atraumatic.     Nose: Nose normal.     Mouth/Throat:     Mouth: Mucous membranes are moist.     Pharynx: Oropharynx is clear.  Eyes:     General: No scleral icterus.    Conjunctiva/sclera: Conjunctivae normal.  Neck:     Trachea: No tracheal deviation.  Cardiovascular:     Rate and Rhythm: Normal rate and regular rhythm.     Pulses: Normal pulses.     Heart sounds: Normal heart sounds. No murmur heard.   No friction rub. No gallop.  Pulmonary:     Effort: Pulmonary effort is normal. No accessory muscle usage or respiratory distress.     Breath sounds: Normal breath sounds.  Chest:     Chest wall: No tenderness.  Abdominal:     General: Bowel sounds are normal. There is no distension.     Palpations: Abdomen is soft.     Tenderness: There is no abdominal tenderness. There is no guarding.  Genitourinary:    Comments: No cva tenderness. Musculoskeletal:        General: No tenderness.     Cervical back: Normal range of motion and neck supple. No rigidity.     Comments: Mild symmetric bilateral foot/ankle/lower  leg swelling. No asymmetric swelling or leg pain. Mild dry, scaly skin to bilateral legs. No cellulitis.   Skin:    General: Skin is warm and dry.     Findings: No rash.  Neurological:     Mental Status: He is alert.     Comments: Alert, speech clear. Steady gait.   Psychiatric:        Mood and Affect: Mood normal.    ED Results / Procedures / Treatments   Labs (all labs ordered are listed, but only abnormal results are displayed) Results for orders placed or performed during the hospital encounter of 54/09/81  Basic metabolic panel  Result Value Ref Range   Sodium 140 135 - 145 mmol/L   Potassium 3.5 3.5 - 5.1 mmol/L   Chloride 108 98 - 111 mmol/L   CO2 24 22 - 32 mmol/L   Glucose, Bld 112 (H) 70 - 99 mg/dL   BUN 15 8 - 23 mg/dL   Creatinine, Ser 1.56 (H) 0.61 - 1.24 mg/dL   Calcium 9.0 8.9 - 10.3 mg/dL   GFR, Estimated 49 (L) >60 mL/min   Anion gap 8 5 - 15  CBC with Differential  Result Value Ref Range   WBC 10.8 (H) 4.0 - 10.5 K/uL   RBC 4.05 (L) 4.22 - 5.81 MIL/uL   Hemoglobin 12.2 (L) 13.0 - 17.0 g/dL   HCT 37.9 (L) 39.0 - 52.0 %   MCV 93.6 80.0 - 100.0 fL   MCH 30.1 26.0 - 34.0 pg   MCHC 32.2 30.0 - 36.0 g/dL   RDW 13.0 11.5 - 15.5 %   Platelets 262 150 - 400 K/uL   nRBC 0.0 0.0 - 0.2 %   Neutrophils Relative % 60 %   Neutro Abs 6.4 1.7 - 7.7 K/uL   Lymphocytes Relative 27 %   Lymphs Abs 3.0 0.7 - 4.0 K/uL   Monocytes Relative 8 %   Monocytes Absolute 0.9 0.1 - 1.0  K/uL   Eosinophils Relative 4 %   Eosinophils Absolute 0.5 0.0 - 0.5 K/uL   Basophils Relative 1 %   Basophils Absolute 0.1 0.0 - 0.1 K/uL   Immature Granulocytes 0 %   Abs Immature Granulocytes 0.04 0.00 - 0.07 K/uL  Troponin I (High Sensitivity)  Result Value Ref Range   Troponin I (High Sensitivity) 9 <18 ng/L   DG Chest 2 View  Result Date: 04/15/2021 CLINICAL DATA:  Chest pain short of breath EXAM: CHEST - 2 VIEW COMPARISON:  04/12/2021 FINDINGS: The heart size and mediastinal contours  are within normal limits. Both lungs are clear. The visualized skeletal structures are unremarkable. IMPRESSION: No active cardiopulmonary disease. Electronically Signed   By: Donavan Foil M.D.   On: 04/15/2021 18:35   DG Chest 2 View  Result Date: 04/12/2021 CLINICAL DATA:  Bilateral lower extremity edema 1 month as well as back pain. EXAM: CHEST - 2 VIEW COMPARISON:  12/03/2020 FINDINGS: Lungs are adequately inflated without focal airspace consolidation or effusion. Cardiomediastinal silhouette and remainder of the exam is unchanged. IMPRESSION: No active cardiopulmonary disease. Electronically Signed   By: Marin Olp M.D.   On: 04/12/2021 14:48   VAS [place] LOWER EXTREMITY VENOUS REFLUX  Result Date: 03/20/2021  Lower Venous Reflux Study Patient Name:  Robert Chan  Date of Exam:   03/20/2021 Medical Rec #: [id number]        Accession #:    [id number] Date of Birth: 1956/03/09       Patient Gender: M Patient Age:   64Y Exam Location:  [facility] Procedure:      VAS [place] LOWER EXTREMITY VENOUS REFLUX Referring Phys: [id number] Georgia Dom CAIN --------------------------------------------------------------------------------  Indications: Pain, Swelling, and Edema.  Risk Factors: DVT History of DVT. Patient unsure when or which leg. Performing Technologist: Delorise Shiner RVT  Examination Guidelines: A complete evaluation includes B-mode imaging, spectral Doppler, color Doppler, and power Doppler as needed of all accessible portions of each vessel. Bilateral testing is considered an integral part of a complete examination. Limited examinations for reoccurring indications may be performed as noted. The reflux portion of the exam is performed with the patient in reverse Trendelenburg. Significant venous reflux is defined as >500 ms in the superficial venous system, and >1 second in the deep venous system.  Venous Reflux Times  +--------------+---------+------+-----------+------------+--------+ RIGHT         Reflux NoRefluxReflux TimeDiameter cmsComments                         Yes                                  +--------------+---------+------+-----------+------------+--------+ CFV           no                                             +--------------+---------+------+-----------+------------+--------+ FV prox       no                                             +--------------+---------+------+-----------+------------+--------+ FV mid        no                                             +--------------+---------+------+-----------+------------+--------+  FV dist       no                                             +--------------+---------+------+-----------+------------+--------+ Popliteal     no                                             +--------------+---------+------+-----------+------------+--------+ GSV at SFJ    no                           0.425             +--------------+---------+------+-----------+------------+--------+ GSV prox thighno                           0.438             +--------------+---------+------+-----------+------------+--------+ GSV mid thigh no                           0.319             +--------------+---------+------+-----------+------------+--------+ GSV dist thighno                           0.315             +--------------+---------+------+-----------+------------+--------+ GSV at knee   no                           0.365             +--------------+---------+------+-----------+------------+--------+ GSV prox calf                              0.292             +--------------+---------+------+-----------+------------+--------+ GSV mid calf                               0.393             +--------------+---------+------+-----------+------------+--------+ SSV Pop Fossa no                            0.306             +--------------+---------+------+-----------+------------+--------+ SSV prox calf no                           0.379             +--------------+---------+------+-----------+------------+--------+ SSV mid calf                               0.347             +--------------+---------+------+-----------+------------+--------+  +--------------+---------+------+-----------+------------+--------+ LEFT          Reflux NoRefluxReflux TimeDiameter cmsComments                         Yes                                  +--------------+---------+------+-----------+------------+--------+  CFV           no                                             +--------------+---------+------+-----------+------------+--------+ FV prox       no                                             +--------------+---------+------+-----------+------------+--------+ FV mid        no                                             +--------------+---------+------+-----------+------------+--------+ FV dist       no                                             +--------------+---------+------+-----------+------------+--------+ Popliteal     no                                             +--------------+---------+------+-----------+------------+--------+ GSV at [facility]    no                           0.347             +--------------+---------+------+-----------+------------+--------+ GSV prox thighno                           0.286             +--------------+---------+------+-----------+------------+--------+ GSV mid thigh no                           0.346             +--------------+---------+------+-----------+------------+--------+ GSV dist thighno                           0.315             +--------------+---------+------+-----------+------------+--------+ GSV at knee   no                           0.278              +--------------+---------+------+-----------+------------+--------+ GSV prox calf                              0.288             +--------------+---------+------+-----------+------------+--------+ GSV mid calf                               0.320             +--------------+---------+------+-----------+------------+--------+ SSV Pop Fossa no  0.479             +--------------+---------+------+-----------+------------+--------+ SSV prox calf no                           0.283             +--------------+---------+------+-----------+------------+--------+ SSV mid calf                               0.251             +--------------+---------+------+-----------+------------+--------+   Summary: Bilateral: - No evidence of deep vein thrombosis seen in the lower extremities, bilaterally, from the common femoral through the popliteal veins. - No evidence of superficial venous thrombosis in the lower extremities, bilaterally. - No evidence of deep venous insufficiency seen bilaterally in the lower extremity. - No evidence of superficial venous reflux seen in the greater saphenous veins bilaterally. - No evidence of superficial venous reflux seen in the short saphenous veins bilaterally.  *See table(s) above for measurements and observations. Electronically signed by Servando Snare MD on 03/20/2021 at 4:24:22 PM.    Final      ED ECG REPORT   Date: 04/15/2021  Rate: 64  Rhythm: normal sinus rhythm  QRS Axis: left  Intervals: PR prolonged  ST/T Wave abnormalities: nonspecific ST changes  Conduction Disutrbances:first-degree A-V block   Narrative Interpretation:   Old EKG Reviewed: unchanged  I have personally reviewed the EKG tracing   Radiology DG Chest 2 View  Result Date: 04/15/2021 CLINICAL DATA:  Chest pain short of breath EXAM: CHEST - 2 VIEW COMPARISON:  04/12/2021 FINDINGS: The heart size and mediastinal contours are within normal limits. Both  lungs are clear. The visualized skeletal structures are unremarkable. IMPRESSION: No active cardiopulmonary disease. Electronically Signed   By: Donavan Foil M.D.   On: 04/15/2021 18:35    Procedures .Marland KitchenIncision and Drainage  Date/Time: 04/15/2021 10:44 PM Performed by: Lajean Saver, MD Authorized by: Lajean Saver, MD   Consent:    Consent given by:  Patient Location:    Type:  Abscess   Location:  Trunk   Trunk location:  Back Pre-procedure details:    Skin preparation:  Chlorhexidine with alcohol Anesthesia:    Anesthesia method:  Local infiltration   Local anesthetic:  Lidocaine 2% WITH epi Procedure type:    Complexity:  Complex Procedure details:    Incision types:  Single straight   Incision depth:  Subcutaneous   Wound management:  Probed and deloculated and irrigated with saline   Drainage:  Purulent   Drainage amount:  Moderate   Wound treatment:  Wound left open   Packing materials:  1/2 in iodoform gauze Post-procedure details:    Procedure completion:  Tolerated well, no immediate complications   Medications Ordered in ED Medications  alum & mag hydroxide-simeth (MAALOX/MYLANTA) 200-200-20 MG/5ML suspension 30 mL (30 mLs Oral Given 04/15/21 2259)  famotidine (PEPCID) tablet 20 mg (20 mg Oral Given 04/15/21 2259)  acetaminophen (TYLENOL) tablet 1,000 mg (1,000 mg Oral Given 04/15/21 2259)    ED Course  I have reviewed the triage vital signs and the nursing notes.  Pertinent labs & imaging results that were available during my care of the patient were reviewed by me and considered in my medical decision making (see chart for details).    MDM Rules/Calculators/A&P  Labs. Ecg. Cxr.   Reviewed nursing notes and prior charts for additional history. Recent ed visit reviewed. Recent vascular u/s neg for dvt.   Labs reviewed/interpreted by me - after symptoms present, constant, for past day, trop normal. Felt not c/w acs.   Acetaminophen  po, pepcid po, maalox po.   I and D abscess.  CXR reviewed/interpreted by me - no pna.   Recheck, pt denies cp or discomfort. No sob. No cellulitis, necrotic or devitalized tissue about abscess, no crepitus. Sterile dressing applied.   Rec pcp f/u. Cardiology f/u.  Return precautions provided.      Final Clinical Impression(s) / ED Diagnoses Final diagnoses:  None    Rx / DC Orders ED Discharge Orders     None           Lajean Saver, MD 04/15/21 2301

## 2021-04-15 NOTE — Progress Notes (Signed)
Subjective:   Robert Chan is a 65 y.o. male who presents for Medicare Annual/Subsequent wellness visit.  Review of Systems    Pertinent negatives listed in HPI    Active Problem List   Robert Chan has age-related nuclear cataract of right eye; Asthma; Benign hypertension; COPD (chronic obstructive pulmonary disease) ([place]); Elevated serum creatinine; Glaucoma; Male erectile disorder;  History of non-ST elevation myocardial infarction (NSTEMI); Coronary artery disease;  MDD (major depressive disorder), recurrent episode, severe ([place]); Cigarette smoker;  PAD (peripheral artery disease) ([place]); Bilateral lower extremity edema; and Acute bilateral low back pain without sciatica on their problem list.        Objective:    Today's Vitals   04/08/21 1400  BP: 140/82  Pulse: 78  Resp: 17  Weight: 283 lb 9.6 oz (128.6 kg)  Height: '6\' 1"'  (1.854 m)   Body mass index is 37.42 kg/m.  Advanced Directives 04/12/2021 06/05/2020 06/04/2020 01/16/2020 09/23/2019 09/23/2019 08/18/2017  Does Patient Have a Medical Advance Directive? No No No No No No No  Would patient like information on creating a medical advance directive? - No - Patient declined No - Patient declined No - Patient declined No - Patient declined - -  Some encounter information is confidential and restricted. Go to Review Flowsheets activity to see all data.    Current Medications (verified) Outpatient Encounter Medications as of 04/08/2021  Medication Sig   albuterol (VENTOLIN HFA) 108 (90 Base) MCG/ACT inhaler Inhale 1-2 puffs into the lungs every 6 (six) hours as needed for wheezing or shortness of breath.   ARIPiprazole (ABILIFY) 10 MG tablet Take 1 tablet (10 mg total) by mouth at bedtime.   aspirin 81 MG EC tablet SMARTSIG:1 Tablet(s) By Mouth Daily   atorvastatin (LIPITOR) 80 MG tablet Take 1 tablet (80 mg total) by mouth daily.   buPROPion (WELLBUTRIN XL) 150 MG 24 hr tablet Take 1 tablet by mouth every morning.    cyclobenzaprine (FLEXERIL) 10 MG tablet Take 1 tablet (10 mg total) by mouth 3 (three) times daily as needed for muscle spasms.   diltiazem (TIAZAC) 240 MG 24 hr capsule Take 240 mg by mouth daily.   hydrOXYzine (ATARAX/VISTARIL) 25 MG tablet Take 1 tablet (25 mg total) by mouth 3 (three) times daily as needed for anxiety.   latanoprost (XALATAN) 0.005 % ophthalmic solution Place 1 drop into both eyes nightly.   SYMBICORT 160-4.5 MCG/ACT inhaler Inhale 2 puffs into the lungs in the morning and at bedtime.   traZODone (DESYREL) 50 MG tablet Take 0.5 tablets (25 mg total) by mouth at bedtime as needed for sleep.   valsartan-hydrochlorothiazide (DIOVAN-HCT) 160-25 MG tablet Take 1 tablet by mouth daily.   Vitamin D, Ergocalciferol, (DRISDOL) 1.25 MG (50000 UNIT) CAPS capsule Take 1 capsule (50,000 Units total) by mouth once a week.   [DISCONTINUED] furosemide (LASIX) 20 MG tablet Take 1 tablet (20 mg total) by mouth daily.   albuterol (PROVENTIL) (2.5 MG/3ML) 0.083% nebulizer solution Take 3 mLs (2.5 mg total) by nebulization every 6 (six) hours as needed for wheezing or shortness of breath. (Patient not taking: Reported on 04/08/2021)   [DISCONTINUED] amLODipine (NORVASC) 10 MG tablet Take 1 tablet (10 mg total) by mouth daily.   No facility-administered encounter medications on file as of 04/08/2021.    Allergies (verified) Shellfish allergy   History: Past Medical History:  Diagnosis Date   Asthma    CAD (coronary artery disease)    Cataract    CKD (chronic kidney  disease)    COPD (chronic obstructive pulmonary disease) (HCC)    Depression    Hypertension    NSTEMI (non-ST elevated myocardial infarction) ([place])    Suicidal ideation 06/05/2020   Past Surgical History:  Procedure Laterality Date   APPENDECTOMY     EYE SURGERY     LEFT HEART CATH AND CORONARY ANGIOGRAPHY N/A 06/05/2020   Procedure: LEFT HEART CATH AND CORONARY ANGIOGRAPHY;  Surgeon: Nigel Mormon, MD;  Location: [facility];  Service: Cardiovascular;  Laterality: N/A;   PROSTATE SURGERY     Family History  Problem Relation Age of Onset   Hypertension Mother    Diabetes Mother    Hypertension Father    Hypertension Sister    Social History   Socioeconomic History   Marital status: Single    Spouse name: Not on file   Number of children: Not on file   Years of education: Not on file   Highest education level: Not on file  Occupational History   Not on file  Tobacco Use   Smoking status: Former    Pack years: 0.00    Types: Cigarettes    Quit date: 12/25/2020    Years since quitting: 0.3   Smokeless tobacco: Never   Tobacco comments:    smokes 3-4 cigarettes/day  Vaping Use   Vaping Use: Never used  Substance and Sexual Activity   Alcohol use: Yes    Comment: drinks 2x/wk, 2-3 40 oz drinks, last drink Friday   Drug use: Yes    Types: Cocaine    Comment: cocaine use once a month, last use Friday    Sexual activity: Not Currently  Other Topics Concern   Not on file  Social History Narrative   Not on file   Social Determinants of Health   Financial Resource Strain: Not on file  Food Insecurity: Not on file  Transportation Needs: Not on file  Physical Activity: Not on file  Stress: Not on file  Social Connections: Not on file    Tobacco Counseling Counseling given: Yes Tobacco comments: smokes 3-4 cigarettes/day    Activities of Daily Living In your present state of health, do you have any difficulty performing the following activities: 06/05/2020 06/05/2020  Hearing? - N  Vision? - N  Difficulty concentrating or making decisions? - N  Walking or climbing stairs? - N  Dressing or bathing? - N  Doing errands, shopping? N -  Some encounter information is confidential and restricted. Go to Review Flowsheets activity to see all data.  Some recent data might be hidden    Patient Care Team: Elsie Stain, MD as PCP - General (Pulmonary Disease) Sueanne Margarita,  MD as PCP - Cardiology (Cardiology)     Assessment:   This is a routine wellness examination for Robert Chan.  Dietary issues and exercise activities discussed:  Reduce carbohydrate intakes    Goals Addressed   Weight loss, Body mass index is 37.42 kg/m.       Smoking Cessation, cutting back down to 3-4 cigarettes per day     Depression Screen [facility] 2/9 Scores 02/26/2021 12/25/2020 12/25/2020 09/29/2020 07/16/2020 06/19/2020 02/28/2020  PHQ - 2 Score 1 2 0 '1 3 5 3  ' PHQ- 9 Score 7 9 - '3 15 20 15    ' Fall Risk Fall Risk  04/15/2021 12/25/2020 09/29/2020 06/20/2020 06/19/2020  Falls in the past year? 0 0 1 0 0  Number falls in past yr: 0 0 1 - -  Injury with Fall? 0 0 0 - -  Risk for fall due to : - - Other (Comment) No Fall Risks -  Follow up - - - - Falls evaluation completed   TIMED UP AND GO:  Was the test performed? Yes .  Length of time to ambulate 10 feet: 3 sec.   Gait steady and fast without assistive device  Cognitive Function:  Intact , no apparent deficits       Immunizations Immunization History  Administered Date(s) Administered   Janssen (J&J) SARS-COV-2 Vaccination 08/13/2020   Moderna SARS-COV2 Booster Vaccination 10/23/2020    TDAP status: Up to date    Pneumococcal vaccine status: Up to date  Covid-19 vaccine status: Information provided on how to obtain vaccines.   Qualifies for Shingles Vaccine? No   Zostavax completed No   Shingrix Completed?: No.    Education has been provided regarding the importance of this vaccine. Patient has been advised to call insurance company to determine out of pocket expense if they have not yet received this vaccine. Advised may also receive vaccine at local pharmacy or Health Dept. Verbalized acceptance and understanding.  Screening Tests Health Maintenance  Topic Date Due   COLON CANCER SCREENING ANNUAL FOBT  Never done   Zoster Vaccines- Shingrix (1 of 2) Never done   COVID-19 Vaccine (3 - Booster for [employer] series)  02/21/2021   TETANUS/TDAP  12/25/2021 (Originally 10/31/1975)   PNA vac Low Risk Adult (1 of 2 - PCV13) 12/25/2021 (Originally 11/01/2019)   INFLUENZA VACCINE  06/01/2021   Hepatitis C Screening  Completed   HPV VACCINES  Aged Out   COLONOSCOPY (Pts 45-83yr Insurance coverage will need to be confirmed)  Discontinued    Health Maintenance  Health Maintenance Due  Topic Date Due   COLON CANCER SCREENING ANNUAL FOBT  Never done   Zoster Vaccines- Shingrix (1 of 2) Never done   COVID-19 Vaccine (3 - Booster for Janssen series) 02/21/2021    Colorectal cancer screening: Type of screening: FOBT/FIT. Received test kit and has not completed or returned card. Repeat every 12 months.  Lung Cancer Screening: (Low Dose CT Chest recommended if Age 67-80years, 30 pack-year currently smoking OR have quit w/in 15years.) does qualify.   Lung Cancer Screening Referral: none completed   Additional Screening:  Hepatitis C Screening:  Need collecting, at next PCP visit   V[facility] Screening: [facility]    Dental Screening: Recommended annual dental exams for proper oral hygiene   Plan:     I have personally reviewed and noted the following in the patient's chart:   Medical and social history Use of alcohol, tobacco or illicit drugs  Current medications  Functional ability and status Nutritional status Physical activity Advanced directives List of other physicians Hospitalizations, surgeries, and ER visits in previous 12 months Vitals Screenings to include cognitive, depression, and falls Referrals and appointments  In addition, I have reviewed and discussed with patient certain preventive protocols, quality metrics, and best practice recommendations. A written personalized care plan for preventive services as well as general preventive health recommendations were provided to patient.     KMolli Barrows FNP   04/15/2021

## 2021-04-15 NOTE — ED Triage Notes (Signed)
Per patient cyst to right upper back and rash to bilateral lower extremities.

## 2021-04-15 NOTE — Discharge Instructions (Signed)
It was our pleasure to provide your ER care today - we hope that you feel better.  The abscess on your back was drained, and there is packing in place - follow up with primary care doctor or urgent care in two days time for wound check and packing removal.  Take acetaminophen as need.   For chest pain, make sure to avoid smoking and/or cocaine use. Continue your meds and eat heart health meal plan. Follow up with cardiologist in the coming week - call office to arrange appointment.   For skin on legs, use cream you were recently prescribed. Elevated legs/feet to help any swelling. Limit salt intake. Follow up with primary care doctor.  Return to ER if worse, new symptoms, fevers, new or severe pain, spreading redness, recurrent or persistent chest pain, trouble breathing, or other concern.

## 2021-04-15 NOTE — ED Provider Notes (Signed)
Emergency Medicine Provider Triage Evaluation Note  Rajat Staver , a 65 y.o. male  was evaluated in triage.  Pt complains of chest tightness beginning around 10 am this morning, constant since onset. Also complains of rash to the lower extremities for several weeks.  Review of Systems  Positive: Chest tightness, shortness of breath Negative: Fever, cough, new lower extremity edema  Physical Exam  BP (!) 142/87 (BP Location: Right Arm)   Pulse 76   Temp 98 F (36.7 C)   Resp 18   Ht 6\' 1"  (1.854 m)   Wt 127 kg   SpO2 95%   BMI 36.94 kg/m  Gen:   Awake, no distress   Resp:  Normal effort, lungs clear, speaks in full sentences MSK:   Moves extremities without difficulty  Other:    Medical Decision Making  Medically screening exam initiated at 6:20 PM.  Appropriate orders placed.  Alyjah Lovingood was informed that the remainder of the evaluation will be completed by another provider, this initial triage assessment does not replace that evaluation, and the importance of remaining in the ED until their evaluation is complete.     Lorayne Bender, PA-C 04/15/21 1821    Lajean Saver, MD 04/16/21 [phone]

## 2021-04-18 ENCOUNTER — Emergency Department (HOSPITAL_COMMUNITY)
Admission: EM | Admit: 2021-04-18 | Discharge: 2021-04-18 | Disposition: A | Discharge status: Home / Self Care | Payer: Medicare HMO | Attending: Emergency Medicine | Admitting: Emergency Medicine

## 2021-04-18 DIAGNOSIS — J45909 Unspecified asthma, uncomplicated: Secondary | ICD-10-CM | POA: Diagnosis not present

## 2021-04-18 DIAGNOSIS — L0291 Cutaneous abscess, unspecified: Secondary | ICD-10-CM

## 2021-04-18 DIAGNOSIS — Z4800 Encounter for change or removal of nonsurgical wound dressing: Secondary | ICD-10-CM | POA: Insufficient documentation

## 2021-04-18 DIAGNOSIS — J449 Chronic obstructive pulmonary disease, unspecified: Secondary | ICD-10-CM | POA: Diagnosis not present

## 2021-04-18 DIAGNOSIS — Z7982 Long term (current) use of aspirin: Secondary | ICD-10-CM | POA: Diagnosis not present

## 2021-04-18 DIAGNOSIS — L02212 Cutaneous abscess of back [any part, except buttock]: Secondary | ICD-10-CM | POA: Insufficient documentation

## 2021-04-18 DIAGNOSIS — N189 Chronic kidney disease, unspecified: Secondary | ICD-10-CM | POA: Diagnosis not present

## 2021-04-18 DIAGNOSIS — Z7951 Long term (current) use of inhaled steroids: Secondary | ICD-10-CM | POA: Insufficient documentation

## 2021-04-18 DIAGNOSIS — Z955 Presence of coronary angioplasty implant and graft: Secondary | ICD-10-CM | POA: Diagnosis not present

## 2021-04-18 DIAGNOSIS — I2510 Atherosclerotic heart disease of native coronary artery without angina pectoris: Secondary | ICD-10-CM | POA: Insufficient documentation

## 2021-04-18 DIAGNOSIS — Z87891 Personal history of nicotine dependence: Secondary | ICD-10-CM | POA: Insufficient documentation

## 2021-04-18 DIAGNOSIS — Z79899 Other long term (current) drug therapy: Secondary | ICD-10-CM | POA: Insufficient documentation

## 2021-04-18 DIAGNOSIS — I129 Hypertensive chronic kidney disease with stage 1 through stage 4 chronic kidney disease, or unspecified chronic kidney disease: Secondary | ICD-10-CM | POA: Insufficient documentation

## 2021-04-18 NOTE — ED Provider Notes (Signed)
[facility] DEPT Provider Note   CSN: [id number] Arrival date & time: 04/18/21  1343     History Chief Complaint  Patient presents with   Abscess    Robert Chan is a 65 y.o. male.  Patient presents with chief complaint of wound check for abscess I&D.  He states that he was here about 3 to 4 days ago had a incision and drainage of this abscess on his upper back.  He states it was packed and he was told to come back today for evaluation.  Was denies fevers or cough or vomiting or diarrhea.  He states he has not really changed the dressing.      Past Medical History:  Diagnosis Date   Asthma    CAD (coronary artery disease)    Cataract    CKD (chronic kidney disease)    COPD (chronic obstructive pulmonary disease) (HCC)    Depression    Hypertension    NSTEMI (non-ST elevated myocardial infarction) ([place])    Suicidal ideation 06/05/2020    Patient Active Problem List   Diagnosis Date Noted   Bilateral lower extremity edema 02/27/2021   Acute bilateral low back pain without sciatica 02/27/2021   PAD (peripheral artery disease) ([place]) 12/25/2020   Coronary vasospasm ([place]) 10/07/2020   Cigarette smoker    MDD (major depressive disorder), recurrent episode, severe ([place]) 09/08/2020   Vitamin D deficiency 06/23/2020   Coronary artery disease 06/19/2020   Homelessness 06/19/2020   History of non-ST elevation myocardial infarction (NSTEMI)    Foot callus 12/12/2019   Cocaine use disorder, mild, abuse (HCC)    Elevated serum creatinine 08/28/2019   Age-related nuclear cataract of right eye 09/13/2016   Benign hypertension 07/06/2016   Glaucoma 07/06/2016   Male erectile disorder 07/06/2016   Asthma 10/18/2012   COPD (chronic obstructive pulmonary disease) ([place]) 10/18/2012    Past Surgical History:  Procedure Laterality Date   APPENDECTOMY     EYE SURGERY     LEFT HEART CATH AND CORONARY ANGIOGRAPHY N/A 06/05/2020   Procedure: LEFT HEART  CATH AND CORONARY ANGIOGRAPHY;  Surgeon: Nigel Mormon, MD;  Location: [facility];  Service: Cardiovascular;  Laterality: N/A;   PROSTATE SURGERY         Family History  Problem Relation Age of Onset   Hypertension Mother    Diabetes Mother    Hypertension Father    Hypertension Sister     Social History   Tobacco Use   Smoking status: Former    Pack years: 0.00    Types: Cigarettes    Quit date: 12/25/2020    Years since quitting: 0.3   Smokeless tobacco: Never   Tobacco comments:    smokes 3-4 cigarettes/day  Vaping Use   Vaping Use: Never used  Substance Use Topics   Alcohol use: Yes    Comment: drinks 2x/wk, 2-3 40 oz drinks, last drink Friday   Drug use: Yes    Types: Cocaine    Comment: cocaine use once a month, last use Friday     Home Medications Prior to Admission medications   Medication Sig Start Date End Date Taking? Authorizing Provider  albuterol (PROVENTIL) (2.5 MG/3ML) 0.083% nebulizer solution Take 3 mLs (2.5 mg total) by nebulization every 6 (six) hours as needed for wheezing or shortness of breath. Patient not taking: Reported on 04/08/2021 09/29/20   Elsie Stain, MD  albuterol (VENTOLIN HFA) 108 (90 Base) MCG/ACT inhaler Inhale 1-2 puffs  into the lungs every 6 (six) hours as needed for wheezing or shortness of breath. 06/19/20   Elsie Stain, MD  ARIPiprazole (ABILIFY) 10 MG tablet Take 1 tablet (10 mg total) by mouth at bedtime. 12/25/20   Elsie Stain, MD  aspirin 81 MG EC tablet SMARTSIG:1 Tablet(s) By Mouth Daily 01/13/21   [provider]  atorvastatin (LIPITOR) 80 MG tablet Take 1 tablet (80 mg total) by mouth daily. 01/05/21   Sueanne Margarita, MD  buPROPion (WELLBUTRIN XL) 150 MG 24 hr tablet Take 1 tablet by mouth every morning. 03/01/21   [provider]  cyclobenzaprine (FLEXERIL) 10 MG tablet Take 1 tablet (10 mg total) by mouth 3 (three) times daily as needed for muscle spasms. 03/05/21   Mayers, Cari S,  PA-C  diltiazem (TIAZAC) 240 MG 24 hr capsule Take 240 mg by mouth daily. 01/05/21   [provider]  furosemide (LASIX) 20 MG tablet Take 1 tablet (20 mg total) by mouth daily. 04/12/21 05/12/21  Isla Pence, MD  hydrOXYzine (ATARAX/VISTARIL) 25 MG tablet Take 1 tablet (25 mg total) by mouth 3 (three) times daily as needed for anxiety. 12/25/20   Elsie Stain, MD  latanoprost (XALATAN) 0.005 % ophthalmic solution Place 1 drop into both eyes nightly. 01/15/21   [provider]  pimecrolimus (ELIDEL) 1 % cream Apply topically 2 (two) times daily. 04/12/21   Isla Pence, MD  SYMBICORT 160-4.5 MCG/ACT inhaler Inhale 2 puffs into the lungs in the morning and at bedtime. 09/29/20   Elsie Stain, MD  traZODone (DESYREL) 50 MG tablet Take 0.5 tablets (25 mg total) by mouth at bedtime as needed for sleep. 12/25/20   Elsie Stain, MD  valsartan-hydrochlorothiazide (DIOVAN-HCT) 160-25 MG tablet Take 1 tablet by mouth daily. 03/29/21   [provider]  Vitamin D, Ergocalciferol, (DRISDOL) 1.25 MG (50000 UNIT) CAPS capsule Take 1 capsule (50,000 Units total) by mouth once a week. 12/25/20   Elsie Stain, MD  amLODipine (NORVASC) 10 MG tablet Take 1 tablet (10 mg total) by mouth daily. 12/25/20 01/05/21  Elsie Stain, MD    Allergies    Shellfish allergy  Review of Systems   Review of Systems  Constitutional:  Negative for fever.  HENT:  Negative for ear pain and sore throat.   Eyes:  Negative for pain.  Respiratory:  Negative for cough.   Cardiovascular:  Negative for chest pain.  Gastrointestinal:  Negative for abdominal pain.  Genitourinary:  Negative for flank pain.  Musculoskeletal:  Negative for back pain.  Skin:  Negative for color change and rash.  Neurological:  Negative for syncope.  All other systems reviewed and are negative.  Physical Exam Updated Vital Signs BP (!) 147/82 (BP Location: Right Arm)   Pulse 88   Temp 98.3 F (36.8 C)  (Oral)   Resp 18   SpO2 97%   Physical Exam Constitutional:      General: He is not in acute distress.    Appearance: He is well-developed.  HENT:     Head: Normocephalic.     Nose: Nose normal.  Eyes:     Extraocular Movements: Extraocular movements intact.  Cardiovascular:     Rate and Rhythm: Normal rate.  Pulmonary:     Effort: Pulmonary effort is normal.  Musculoskeletal:     Comments: Right upper back incision and drainage site appears clean dry and intact.  Scant amount of purulent drainage noted.  Packing is in  place and removed and the wound cavity appears clean.  No cellulitis noted no tenderness noted.  Skin:    Coloration: Skin is not jaundiced.  Neurological:     Mental Status: He is alert. Mental status is at baseline.    ED Results / Procedures / Treatments   Labs (all labs ordered are listed, but only abnormal results are displayed) Labs Reviewed - No data to display  EKG None  Radiology No results found.  Procedures Procedures   Medications Ordered in ED Medications - No data to display  ED Course  I have reviewed the triage vital signs and the nursing notes.  Pertinent labs & imaging results that were available during my care of the patient were reviewed by me and considered in my medical decision making (see chart for details).    MDM Rules/Calculators/A&P                          Wound is otherwise well-appearing.  Patient discharged home to follow-up with primary care doctor within the week.  Advised immediate return for worsening symptoms fevers or any additional concerns.  I advised patient to continue change dressing daily and wash the wound as he normally would.   Final Clinical Impression(s) / ED Diagnoses Final diagnoses:  Abscess    Rx / DC Orders ED Discharge Orders     None        Luna Fuse, MD 04/18/21 1525

## 2021-04-18 NOTE — ED Triage Notes (Signed)
Pt complains of abscess on upper back for past 1.5 days.

## 2021-04-18 NOTE — Discharge Instructions (Addendum)
Call your primary care doctor or specialist as discussed in the next 3-5 days.   Return immediately back to the ER if:  Your symptoms worsen within the next 12-24 hours. You develop new symptoms such as new fevers, persistent vomiting, new pain, shortness of breath, or new weakness or numbness, or if you have any other concerns.

## 2021-04-21 ENCOUNTER — Telehealth: Payer: Self-pay

## 2021-04-21 NOTE — Telephone Encounter (Signed)
Contacted pt to go schedule an appt with Dr. Joya Gaskins pt didn't answer and was unable to lvm due to vm being full   Per Dr. Joya Gaskins This pt needs to be added on next week mon or tue for wound check

## 2021-04-22 ENCOUNTER — Ambulatory Visit (INDEPENDENT_AMBULATORY_CARE_PROVIDER_SITE_OTHER): Payer: Medicare HMO | Admitting: Nurse Practitioner

## 2021-04-22 VITALS — BP 133/85 | HR 86 | Temp 97.9°F

## 2021-04-22 DIAGNOSIS — Z9889 Other specified postprocedural states: Secondary | ICD-10-CM | POA: Diagnosis not present

## 2021-04-22 DIAGNOSIS — L209 Atopic dermatitis, unspecified: Secondary | ICD-10-CM | POA: Diagnosis not present

## 2021-04-22 HISTORY — DX: Other specified postprocedural states: Z98.890

## 2021-04-22 MED ORDER — TACROLIMUS 0.1 % EX CREA: 1.0000 "application " | TOPICAL_CREAM | Freq: Two times a day (BID) | CUTANEOUS | 0 refills | Status: DC

## 2021-04-22 NOTE — Patient Instructions (Addendum)
Wound check after I&D:  Atopic dermatitis:  Will order Tacrolimus cream for dermatitis  Dressing changed to wound site  Keep area clean and dry  Follow up:  Follow up with Dr. Joya Gaskins next week  Incision and Drainage, Care After This sheet gives you information about how to care for yourself after your procedure. Your health care provider may also give you more specific instructions. If you have problems or questions, contact your health careprovider. What can I expect after the procedure? After the procedure, it is common to have: Pain or discomfort around the incision site. Blood, fluid, or pus (drainage) from the incision. Redness and firm skin around the incision site. Follow these instructions at home: Medicines Take over-the-counter and prescription medicines only as told by your health care provider. If you were prescribed an antibiotic medicine, use or take it as told by your health care provider. Do not stop using the antibiotic even if you start to feel better. Wound care Follow instructions from your health care provider about how to take care of your wound. Make sure you: Wash your hands with soap and water before and after you change your bandage (dressing). If soap and water are not available, use hand sanitizer. Change your dressing and packing as told by your health care provider. If your dressing is dry or stuck when you try to remove it, moisten or wet the dressing with saline or water so that it can be removed without harming your skin or tissues. If your wound is packed, leave it in place until your health care provider tells you to remove it. To remove the packing, moisten or wet the packing with saline or water so that it can be removed without harming your skin or tissues. Leave stitches (sutures), skin glue, or adhesive strips in place. These skin closures may need to stay in place for 2 weeks or longer. If adhesive strip edges start to loosen and curl up, you  may trim the loose edges. Do not remove adhesive strips completely unless your health care provider tells you to do that. Check your wound every day for signs of infection. Check for: More redness, swelling, or pain. More fluid or blood. Warmth. Pus or a bad smell. If you were sent home with a drain tube in place, follow instructions from your health care provider about: How to empty it. How to care for it at home.  General instructions Rest the affected area. Do not take baths, swim, or use a hot tub until your health care provider approves. Ask your health care provider if you may take showers. You may only be allowed to take sponge baths. Return to your normal activities as told by your health care provider. Ask your health care provider what activities are safe for you. Your health care provider may put you on activity or lifting restrictions. The incision will continue to drain. It is normal to have some clear or slightly bloody drainage. The amount of drainage should lessen each day. Do not apply any creams, ointments, or liquids unless you have been told to by your health care provider. Keep all follow-up visits as told by your health care provider. This is important. Contact a health care provider if: Your cyst or abscess returns. You have a fever or chills. You have more redness, swelling, or pain around your incision. You have more fluid or blood coming from your incision. Your incision feels warm to the touch. You have pus or a bad smell  coming from your incision. You have red streaks above or below the incision site. Get help right away if: You have severe pain or bleeding. You cannot eat or drink without vomiting. You have decreased urine output. You become short of breath. You have chest pain. You cough up blood. The affected area becomes numb or starts to tingle. These symptoms may represent a serious problem that is an emergency. Do not wait to see if the symptoms  will go away. Get medical help right away. Call your local emergency services (911 in the [place] Do not drive yourself to the hospital. Summary After this procedure, it is common to have fluid, blood, or pus coming from the surgery site. Follow all home care instructions. You will be told how to take care of your incision, how to check for infection, and how to take medicines. If you were prescribed an antibiotic medicine, take it as told by your health care provider. Do not stop taking the antibiotic even if you start to feel better. Contact a health care provider if you have increased redness, swelling, or pain around your incision. Get help right away if you have chest pain, you vomit, you cough up blood, or you have shortness of breath. Keep all follow-up visits as told by your health care provider. This is important. This information is not intended to replace advice given to you by your health care provider. Make sure you discuss any questions you have with your healthcare provider. Document Revised: 09/18/2018 Document Reviewed: 09/18/2018 Elsevier Patient Education  2022 [employer].  Atopic Dermatitis Atopic dermatitis is a skin disorder that causes inflammation of the skin. It is marked by a red rash and itchy, dry, scaly skin. It is the most common type of eczema. Eczema is a group of skin conditions that cause the skin to become rough and swollen. This condition is generally worse during the cooler wintermonths and often improves during the warm summer months. Atopic dermatitis usually starts showing signs in infancy and can last through adulthood. This condition cannot be passed from one person to another (is not contagious). Atopic dermatitis may not always be present, but when it is, it is called aflare-up. What are the causes? The exact cause of this condition is not known. Flare-ups may be triggered by: Coming in contact with something that you are sensitive or allergic to  (allergen). Stress. Certain foods. Extremely hot or cold weather. Harsh chemicals and soaps. Dry air. Chlorine. What increases the risk? This condition is more likely to develop in people who have a personal or family history of: Eczema. Allergies. Asthma. Hay fever. What are the signs or symptoms? Symptoms of this condition include: Dry, scaly skin. Red, itchy rash. Itchiness, which can be severe. This may occur before the skin rash. This can make sleeping difficult. Skin thickening and cracking that can occur over time. How is this diagnosed? This condition is diagnosed based on: Your symptoms. Your medical history. A physical exam. How is this treated? There is no cure for this condition, but symptoms can usually be controlled. Treatment focuses on: Controlling the itchiness and scratching. You may be given medicines, such as antihistamines or steroid creams. Limiting exposure to allergens. Recognizing situations that cause stress and developing a plan to manage stress. If your atopic dermatitis does not get better with medicines, or if it is all over your body (widespread), a treatment using a specific type of light (phototherapy) may be used. Follow these instructions at home: Skin  care  Keep your skin well moisturized. Doing this seals in moisture and helps to prevent dryness. Use unscented lotions that have petroleum in them. Avoid lotions that contain alcohol or water. They can dry the skin. Keep baths or showers short (less than 5 minutes) in warm water. Do not use hot water. Use mild, unscented cleansers for bathing. Avoid soap and bubble bath. Apply a moisturizer to your skin right after a bath or shower. Do not apply anything to your skin without checking with your health care provider.  General instructions Take or apply over-the-counter and prescription medicines only as told by your health care provider. Dress in clothes made of cotton or cotton blends. Dress  lightly because heat increases itchiness. When washing your clothes, rinse your clothes twice so all of the soap is removed. Avoid any triggers that can cause a flare-up. Keep your fingernails cut short. Avoid scratching. Scratching makes the rash and itchiness worse. A break in the skin from scratching could result in a skin infection (impetigo). Do not be around people who have cold sores or fever blisters. If you get the infection, it may cause your atopic dermatitis to worsen. Keep all follow-up visits. This is important. Contact a health care provider if: Your itchiness interferes with sleep. Your rash gets worse or is not better within one week of starting treatment. You have a fever. You have a rash flare-up after having contact with someone who has cold sores or fever blisters. Get help right away if: You develop pus or soft yellow scabs in the rash area. Summary Atopic dermatitis causes a red rash and itchy, dry, scaly skin. Treatment focuses on controlling the itchiness and scratching, limiting exposure to things that you are sensitive or allergic to (allergens), recognizing situations that cause stress, and developing a plan to manage stress. Keep your skin well moisturized. Keep baths or showers shorter than 5 minutes and use warm water. Do not use hot water. This information is not intended to replace advice given to you by your health care provider. Make sure you discuss any questions you have with your healthcare provider. Document Revised: 07/28/2020 Document Reviewed: 07/28/2020 Elsevier Patient Education  2022 [employer].

## 2021-04-22 NOTE — Progress Notes (Signed)
@Patient  ID: Robert Chan, male    DOB: 01-29-1956, 65 y.o.   MRN: [id number]  Chief Complaint  Patient presents with   Hospitalization Follow-up     Referring provider: Elsie Stain, MD   HPI  Patient presents today for transition of care visit.  Patient was seen in the ED at [facility] on 04/15/2021.  For an abscess on his right upper back.  He did have an incision and drainage performed.  He returned to the ED for a wound check on 04/18/2021 and to remove packing.  Patient has been keeping the wound site clean and bandaged.  Upon exam today it does appear to be well-healing.  No signs of infection.  There is some scant drainage noted.  Patient also complains today of eczema to arms and legs.  He is requesting that a cream be called in to help relieve this.Denies f/c/s, n/v/d, hemoptysis, PND, chest pain or edema.       Allergies  Allergen Reactions   Shellfish Allergy Hives and Rash    Immunization History  Administered Date(s) Administered   Janssen (J&J) SARS-COV-2 Vaccination 08/13/2020   Moderna SARS-COV2 Booster Vaccination 10/23/2020    Past Medical History:  Diagnosis Date   Asthma    CAD (coronary artery disease)    Cataract    CKD (chronic kidney disease)    COPD (chronic obstructive pulmonary disease) (HCC)    Depression    Hypertension    NSTEMI (non-ST elevated myocardial infarction) ([place])    Suicidal ideation 06/05/2020    Tobacco History: Social History   Tobacco Use  Smoking Status Former   Pack years: 0.00   Types: Cigarettes   Quit date: 12/25/2020   Years since quitting: 0.3  Smokeless Tobacco Never  Tobacco Comments   smokes 3-4 cigarettes/day   Counseling given: Yes Tobacco comments: smokes 3-4 cigarettes/day   Outpatient Encounter Medications as of 04/22/2021  Medication Sig   [DISCONTINUED] Tacrolimus 0.1 % CREA Apply 1 application topically in the morning and at bedtime for 14 days. (Patient not taking: Reported on 04/28/2021)    aspirin 81 MG EC tablet SMARTSIG:1 Tablet(s) By Mouth Daily   atorvastatin (LIPITOR) 80 MG tablet Take 1 tablet (80 mg total) by mouth daily.   hydrOXYzine (ATARAX/VISTARIL) 25 MG tablet Take 1 tablet (25 mg total) by mouth 3 (three) times daily as needed for anxiety.   SYMBICORT 160-4.5 MCG/ACT inhaler Inhale 2 puffs into the lungs in the morning and at bedtime.   [DISCONTINUED] albuterol (PROVENTIL) (2.5 MG/3ML) 0.083% nebulizer solution Take 3 mLs (2.5 mg total) by nebulization every 6 (six) hours as needed for wheezing or shortness of breath.   [DISCONTINUED] albuterol (VENTOLIN HFA) 108 (90 Base) MCG/ACT inhaler Inhale 1-2 puffs into the lungs every 6 (six) hours as needed for wheezing or shortness of breath.   [DISCONTINUED] amLODipine (NORVASC) 10 MG tablet Take 1 tablet (10 mg total) by mouth daily.   [DISCONTINUED] ARIPiprazole (ABILIFY) 10 MG tablet Take 1 tablet (10 mg total) by mouth at bedtime.   [DISCONTINUED] buPROPion (WELLBUTRIN XL) 150 MG 24 hr tablet Take 1 tablet by mouth every morning.   [DISCONTINUED] cyclobenzaprine (FLEXERIL) 10 MG tablet Take 1 tablet (10 mg total) by mouth 3 (three) times daily as needed for muscle spasms. (Patient not taking: Reported on 04/28/2021)   [DISCONTINUED] diltiazem (TIAZAC) 240 MG 24 hr capsule Take 240 mg by mouth daily.   [DISCONTINUED] furosemide (LASIX) 20 MG tablet Take 1 tablet (20 mg total)  by mouth daily.   [DISCONTINUED] latanoprost (XALATAN) 0.005 % ophthalmic solution Place 1 drop into both eyes nightly. (Patient not taking: Reported on 04/28/2021)   [DISCONTINUED] pimecrolimus (ELIDEL) 1 % cream Apply topically 2 (two) times daily. (Patient not taking: Reported on 04/28/2021)   [DISCONTINUED] traZODone (DESYREL) 50 MG tablet Take 0.5 tablets (25 mg total) by mouth at bedtime as needed for sleep.   [DISCONTINUED] valsartan-hydrochlorothiazide (DIOVAN-HCT) 160-25 MG tablet Take 1 tablet by mouth daily.   [DISCONTINUED] Vitamin D,  Ergocalciferol, (DRISDOL) 1.25 MG (50000 UNIT) CAPS capsule Take 1 capsule (50,000 Units total) by mouth once a week.   No facility-administered encounter medications on file as of 04/22/2021.     Review of Systems  Review of Systems  Constitutional: Negative.   HENT: Negative.    Respiratory:  Negative for cough and shortness of breath.   Cardiovascular: Negative.   Gastrointestinal: Negative.   Endocrine: Negative.   Genitourinary: Negative.   Musculoskeletal: Negative.   Allergic/Immunologic: Negative.   Neurological: Negative.   Psychiatric/Behavioral: Negative.        Physical Exam  BP 133/85   Pulse 86   Temp 97.9 F (36.6 C)   SpO2 99%   Wt Readings from Last 5 Encounters:  04/15/21 280 lb (127 kg)  04/08/21 283 lb 9.6 oz (128.6 kg)  03/20/21 276 lb 8 oz (125.4 kg)  02/26/21 284 lb (128.8 kg)  02/06/21 270 lb (122.5 kg)     Physical Exam Vitals and nursing note reviewed.  Constitutional:      General: He is not in acute distress.    Appearance: He is well-developed.  Cardiovascular:     Rate and Rhythm: Normal rate and regular rhythm.  Pulmonary:     Effort: Pulmonary effort is normal.     Breath sounds: Normal breath sounds.  Musculoskeletal:     Right lower leg: No edema.     Left lower leg: No edema.  Skin:    General: Skin is warm and dry.  Neurological:     Mental Status: He is alert and oriented to person, place, and time.  Psychiatric:        Mood and Affect: Mood normal.        Behavior: Behavior normal.     Lab Results:  CBC    Component Value Date/Time   WBC 10.8 (H) 04/15/2021 1858   RBC 4.05 (L) 04/15/2021 1858   HGB 12.2 (L) 04/15/2021 1858   HGB 12.5 (L) 02/28/2020 1014   HCT 37.9 (L) 04/15/2021 1858   HCT 36.6 (L) 02/28/2020 1014   PLT 262 04/15/2021 1858   PLT 256 02/28/2020 1014   MCV 93.6 04/15/2021 1858   MCV 92 02/28/2020 1014   MCH 30.1 04/15/2021 1858   MCHC 32.2 04/15/2021 1858   RDW 13.0 04/15/2021 1858    RDW 12.1 02/28/2020 1014   LYMPHSABS 3.0 04/15/2021 1858   LYMPHSABS 2.2 02/28/2020 1014   MONOABS 0.9 04/15/2021 1858   EOSABS 0.5 04/15/2021 1858   EOSABS 0.3 02/28/2020 1014   BASOSABS 0.1 04/15/2021 1858   BASOSABS 0.1 02/28/2020 1014    BMET    Component Value Date/Time   NA 140 04/15/2021 1858   NA 140 12/25/2020 1102   K 3.5 04/15/2021 1858   CL 108 04/15/2021 1858   CO2 24 04/15/2021 1858   GLUCOSE 112 (H) 04/15/2021 1858   BUN 15 04/15/2021 1858   BUN 15 12/25/2020 1102   CREATININE 1.56 (H) 04/15/2021 1858  CALCIUM 9.0 04/15/2021 1858   GFRNONAA 49 (L) 04/15/2021 1858   GFRAA >60 07/08/2020 0908    BNP    Component Value Date/Time   BNP 48.5 10/06/2020 2319    ProBNP No results found for: PROBNP  Imaging: DG Chest 2 View  Result Date: 04/15/2021 CLINICAL DATA:  Chest pain short of breath EXAM: CHEST - 2 VIEW COMPARISON:  04/12/2021 FINDINGS: The heart size and mediastinal contours are within normal limits. Both lungs are clear. The visualized skeletal structures are unremarkable. IMPRESSION: No active cardiopulmonary disease. Electronically Signed   By: Donavan Foil M.D.   On: 04/15/2021 18:35   DG Chest 2 View  Result Date: 04/12/2021 CLINICAL DATA:  Bilateral lower extremity edema 1 month as well as back pain. EXAM: CHEST - 2 VIEW COMPARISON:  12/03/2020 FINDINGS: Lungs are adequately inflated without focal airspace consolidation or effusion. Cardiomediastinal silhouette and remainder of the exam is unchanged. IMPRESSION: No active cardiopulmonary disease. Electronically Signed   By: Marin Olp M.D.   On: 04/12/2021 14:48     Assessment & Plan:   Status post incision and drainage Wound check after I&D:  Atopic dermatitis:  Will order Tacrolimus cream for dermatitis  Dressing changed to wound site  Keep area clean and dry  Follow up:  Follow up with Dr. Joya Gaskins next week     Fenton Foy, NP 04/28/2021

## 2021-04-28 ENCOUNTER — Other Ambulatory Visit: Payer: Self-pay

## 2021-04-28 ENCOUNTER — Encounter: Payer: Self-pay | Admitting: Critical Care Medicine

## 2021-04-28 ENCOUNTER — Ambulatory Visit: Payer: Medicare HMO | Attending: Critical Care Medicine | Admitting: Critical Care Medicine

## 2021-04-28 DIAGNOSIS — Z87891 Personal history of nicotine dependence: Secondary | ICD-10-CM | POA: Diagnosis not present

## 2021-04-28 DIAGNOSIS — R600 Localized edema: Secondary | ICD-10-CM | POA: Diagnosis not present

## 2021-04-28 DIAGNOSIS — Z5900 Homelessness unspecified: Secondary | ICD-10-CM

## 2021-04-28 DIAGNOSIS — F1410 Cocaine abuse, uncomplicated: Secondary | ICD-10-CM

## 2021-04-28 DIAGNOSIS — I2584 Coronary atherosclerosis due to calcified coronary lesion: Secondary | ICD-10-CM

## 2021-04-28 DIAGNOSIS — H4020X0 Unspecified primary angle-closure glaucoma, stage unspecified: Secondary | ICD-10-CM | POA: Diagnosis not present

## 2021-04-28 DIAGNOSIS — E559 Vitamin D deficiency, unspecified: Secondary | ICD-10-CM

## 2021-04-28 DIAGNOSIS — R69 Illness, unspecified: Secondary | ICD-10-CM | POA: Diagnosis not present

## 2021-04-28 DIAGNOSIS — J410 Simple chronic bronchitis: Secondary | ICD-10-CM | POA: Diagnosis not present

## 2021-04-28 DIAGNOSIS — I10 Essential (primary) hypertension: Secondary | ICD-10-CM | POA: Diagnosis not present

## 2021-04-28 DIAGNOSIS — I2510 Atherosclerotic heart disease of native coronary artery without angina pectoris: Secondary | ICD-10-CM

## 2021-04-28 DIAGNOSIS — Z9889 Other specified postprocedural states: Secondary | ICD-10-CM

## 2021-04-28 DIAGNOSIS — I739 Peripheral vascular disease, unspecified: Secondary | ICD-10-CM | POA: Diagnosis not present

## 2021-04-28 MED ORDER — LATANOPROST 0.005 % OP SOLN: OPHTHALMIC | 3 refills | Status: DC

## 2021-04-28 MED ORDER — ALBUTEROL SULFATE (2.5 MG/3ML) 0.083% IN NEBU: 2.5000 mg | INHALATION_SOLUTION | Freq: Four times a day (QID) | RESPIRATORY_TRACT | 1 refills | Status: DC | PRN

## 2021-04-28 MED ORDER — ALBUTEROL SULFATE HFA 108 (90 BASE) MCG/ACT IN AERS: 1.0000 | INHALATION_SPRAY | Freq: Four times a day (QID) | RESPIRATORY_TRACT | 2 refills | Status: DC | PRN

## 2021-04-28 MED ORDER — TRAZODONE HCL 50 MG PO TABS: 25.0000 mg | ORAL_TABLET | Freq: Every evening | ORAL | 0 refills | Status: DC | PRN

## 2021-04-28 MED ORDER — VITAMIN D (ERGOCALCIFEROL) 1.25 MG (50000 UNIT) PO CAPS: 50000.0000 [IU] | ORAL_CAPSULE | ORAL | 1 refills | Status: DC

## 2021-04-28 MED ORDER — FUROSEMIDE 20 MG PO TABS: 20.0000 mg | ORAL_TABLET | Freq: Every day | ORAL | 1 refills | Status: DC

## 2021-04-28 MED ORDER — DILTIAZEM HCL ER BEADS 240 MG PO CP24: 240.0000 mg | ORAL_CAPSULE | Freq: Every day | ORAL | 2 refills | Status: DC

## 2021-04-28 MED ORDER — ARIPIPRAZOLE 10 MG PO TABS: 10.0000 mg | ORAL_TABLET | Freq: Every day | ORAL | 3 refills | Status: DC

## 2021-04-28 MED ORDER — VALSARTAN-HYDROCHLOROTHIAZIDE 160-25 MG PO TABS: 1.0000 | ORAL_TABLET | Freq: Every day | ORAL | 2 refills | Status: DC

## 2021-04-28 MED ORDER — BUPROPION HCL ER (XL) 150 MG PO TB24: 150.0000 mg | ORAL_TABLET | Freq: Every morning | ORAL | 3 refills | Status: DC
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
## 2021-04-28 NOTE — Assessment & Plan Note (Signed)
Needs vascular surgery follow-up

## 2021-04-28 NOTE — Assessment & Plan Note (Signed)
Stable breathing off tobacco products

## 2021-04-28 NOTE — Assessment & Plan Note (Signed)
Taking vitamin D weekly needs to be refilled

## 2021-04-28 NOTE — Assessment & Plan Note (Signed)
Complaints of chronic lower extremity edema likely due to diltiazem will evaluate when he comes to the office

## 2021-04-28 NOTE — Assessment & Plan Note (Signed)
Status post incision and drainage of abscess on the back will reassess when he comes to the office but appears to be healing

## 2021-04-28 NOTE — Assessment & Plan Note (Signed)
Wound check after I&D:  Atopic dermatitis:  Will order Tacrolimus cream for dermatitis  Dressing changed to wound site  Keep area clean and dry  Follow up:  Follow up with Dr. Joya Gaskins next week

## 2021-04-28 NOTE — Assessment & Plan Note (Signed)
Former smoker 

## 2021-04-28 NOTE — Assessment & Plan Note (Signed)
Recent blood pressure is stable continue current medications

## 2021-04-28 NOTE — Assessment & Plan Note (Signed)
H refills on his glaucoma eyedrops

## 2021-04-28 NOTE — Assessment & Plan Note (Signed)
No chest pain we will monitor

## 2021-04-28 NOTE — Assessment & Plan Note (Signed)
Much more stable now has an apartment

## 2021-04-28 NOTE — Assessment & Plan Note (Signed)
No longer using cocaine 

## 2021-05-01 ENCOUNTER — Other Ambulatory Visit: Payer: Self-pay | Admitting: Critical Care Medicine

## 2021-05-01 NOTE — Telephone Encounter (Signed)
Notes to clinic:  Medication requested is not on current med list  Review for continued use and refill    Requested Prescriptions  Pending Prescriptions Disp Refills   mirtazapine (REMERON) 15 MG tablet [Pharmacy Med Name: MIRTAZAPINE 15 MG TABLET] 30 tablet 2    Sig: TAKE 1 TABLET BY MOUTH EVERYDAY AT BEDTIME      Psychiatry: Antidepressants - mirtazapine Failed - 05/01/2021  8:55 AM      Failed - WBC in normal range and within 360 days    WBC  Date Value Ref Range Status  04/15/2021 10.8 (H) 4.0 - 10.5 K/uL Final          Passed - AST in normal range and within 360 days    AST  Date Value Ref Range Status  12/25/2020 22 0 - 40 IU/L Final          Passed - ALT in normal range and within 360 days    ALT  Date Value Ref Range Status  12/25/2020 18 0 - 44 IU/L Final          Passed - Triglycerides in normal range and within 360 days    Triglycerides  Date Value Ref Range Status  12/25/2020 71 0 - 149 mg/dL Final          Passed - Total Cholesterol in normal range and within 360 days    Cholesterol, Total  Date Value Ref Range Status  12/25/2020 131 100 - 199 mg/dL Final          Passed - Completed PHQ-2 or PHQ-9 in the last 360 days      Passed - Valid encounter within last 6 months    Recent Outpatient Visits           3 days ago Simple chronic bronchitis ([place])   [place] Elsie Stain, MD   3 weeks ago Coronary artery disease due to lipid rich plaque   [place] Scot Jun, FNP   3 months ago Benign hypertension   [place], Patrick E, MD   7 months ago Abnormal glucose   [place] Elsie Stain, MD   10 months ago Coronary artery disease of native artery of native heart with stable angina pectoris [facility])   [place], MD       Future Appointments              In 2 weeks  [facility] [place]   In 1 month Joya Gaskins Burn[place] Harry, MD [place]               folic acid (FOLVITE) 1 MG tablet [Pharmacy Med Name: FOLIC ACID 1 MG TABLET] 30 tablet 1    Sig: TAKE 1 TABLET BY [place]      Endocrinology:  Vitamins Passed - 05/01/2021  8:55 AM      Passed - Valid encounter within last 12 months    Recent Outpatient Visits           3 days ago Simple chronic bronchitis ([place])   [place] Elsie Stain, MD   3 weeks ago Coronary artery disease due to lipid rich plaque   [place], FNP   3 months ago Benign hypertension  [place] Elsie Stain, MD   7 months ago Abnormal glucose   [place], MD   10 months ago Coronary artery disease of native artery of native heart with stable angina pectoris [facility])   [place], MD       Future Appointments             In 2 weeks  [facility] [place]   In [place], MD [place]

## 2021-05-11 DIAGNOSIS — G4700 Insomnia, unspecified: Secondary | ICD-10-CM | POA: Diagnosis not present

## 2021-05-11 DIAGNOSIS — E785 Hyperlipidemia, unspecified: Secondary | ICD-10-CM | POA: Diagnosis not present

## 2021-05-11 DIAGNOSIS — R69 Illness, unspecified: Secondary | ICD-10-CM | POA: Diagnosis not present

## 2021-05-11 DIAGNOSIS — M62838 Other muscle spasm: Secondary | ICD-10-CM | POA: Diagnosis not present

## 2021-05-11 DIAGNOSIS — L309 Dermatitis, unspecified: Secondary | ICD-10-CM | POA: Diagnosis not present

## 2021-05-11 DIAGNOSIS — H409 Unspecified glaucoma: Secondary | ICD-10-CM | POA: Diagnosis not present

## 2021-05-11 DIAGNOSIS — I10 Essential (primary) hypertension: Secondary | ICD-10-CM | POA: Diagnosis not present

## 2021-05-11 DIAGNOSIS — J449 Chronic obstructive pulmonary disease, unspecified: Secondary | ICD-10-CM | POA: Diagnosis not present

## 2021-05-18 ENCOUNTER — Other Ambulatory Visit: Payer: Self-pay | Admitting: Critical Care Medicine

## 2021-05-18 DIAGNOSIS — L300 Nummular dermatitis: Secondary | ICD-10-CM | POA: Diagnosis not present

## 2021-05-18 DIAGNOSIS — I8312 Varicose veins of left lower extremity with inflammation: Secondary | ICD-10-CM | POA: Diagnosis not present

## 2021-05-18 DIAGNOSIS — L0101 Non-bullous impetigo: Secondary | ICD-10-CM | POA: Diagnosis not present

## 2021-05-18 DIAGNOSIS — I8311 Varicose veins of right lower extremity with inflammation: Secondary | ICD-10-CM | POA: Diagnosis not present

## 2021-05-18 NOTE — Telephone Encounter (Signed)
Notes to clinic:  scripts are expired  Review for continued use and refill    Requested Prescriptions  Pending Prescriptions Disp Refills   mirtazapine (REMERON) 15 MG tablet [Pharmacy Med Name: MIRTAZAPINE 15 MG TABLET] 30 tablet 2    Sig: TAKE 1 TABLET BY MOUTH EVERYDAY AT BEDTIME      Psychiatry: Antidepressants - mirtazapine Failed - 05/18/2021 11:44 AM      Failed - WBC in normal range and within 360 days    WBC  Date Value Ref Range Status  04/15/2021 10.8 (H) 4.0 - 10.5 K/uL Final          Passed - AST in normal range and within 360 days    AST  Date Value Ref Range Status  12/25/2020 22 0 - 40 IU/L Final          Passed - ALT in normal range and within 360 days    ALT  Date Value Ref Range Status  12/25/2020 18 0 - 44 IU/L Final          Passed - Triglycerides in normal range and within 360 days    Triglycerides  Date Value Ref Range Status  12/25/2020 71 0 - 149 mg/dL Final          Passed - Total Cholesterol in normal range and within 360 days    Cholesterol, Total  Date Value Ref Range Status  12/25/2020 131 100 - 199 mg/dL Final          Passed - Completed PHQ-2 or PHQ-9 in the last 360 days      Passed - Valid encounter within last 6 months    Recent Outpatient Visits           2 weeks ago Simple chronic bronchitis ([place])   [place] Elsie Stain, MD   1 month ago Coronary artery disease due to lipid rich plaque   [place] Scot Jun, FNP   3 months ago Benign hypertension   [place], Patrick E, MD   7 months ago Abnormal glucose   [place] Elsie Stain, MD   11 months ago Coronary artery disease of native artery of native heart with stable angina pectoris [facility])   [place], MD       Future Appointments             [facility] [place]   In 4 weeks Elsie Stain, MD [place]               folic acid (FOLVITE) 1 MG tablet [Pharmacy Med Name: FOLIC ACID 1 MG TABLET] 30 tablet 1    Sig: TAKE 1 TABLET BY [place]      Endocrinology:  Vitamins Passed - 05/18/2021 11:44 AM      Passed - Valid encounter within last 12 months    Recent Outpatient Visits           2 weeks ago Simple chronic bronchitis ([place])   [place] Elsie Stain, MD   1 month ago Coronary artery disease due to lipid rich plaque   [place], FNP   3 months ago Benign hypertension   [place]  Elsie Stain, MD   7 months ago Abnormal glucose   [place] Elsie Stain, MD   11 months ago Coronary artery disease of native artery of native heart with stable angina pectoris [facility])   [place], MD       Future Appointments             [facility] [place]   In 4 weeks Elsie Stain, MD [place]               amLODipine (NORVASC) 5 MG tablet [Pharmacy Med Name: AMLODIPINE BESYLATE 5 MG TAB] 30 tablet 1    Sig: TAKE 1 TABLET BY MOUTH EVERY DAY      Cardiovascular:  Calcium Channel Blockers Passed - 05/18/2021 11:44 AM      Passed - Last BP in normal range    BP Readings from Last 1 Encounters:  04/23/21 133/85          Passed - Valid encounter within last 6 months    Recent Outpatient Visits           2 weeks ago Simple chronic bronchitis ([place])   [place] Elsie Stain, MD   1 month ago Coronary artery disease due to lipid rich plaque   [place], FNP   3 months ago Benign hypertension   [place], MD   7 months ago Abnormal glucose   [place] Elsie Stain, MD   11 months ago Coronary artery disease of native artery of native heart with stable angina pectoris [facility])   [place] Elsie Stain, MD       Future Appointments             [facility] [place]   In 4 weeks Elsie Stain, MD [place]

## 2021-05-19 ENCOUNTER — Ambulatory Visit: Payer: Medicare HMO

## 2021-05-21 ENCOUNTER — Ambulatory Visit: Payer: Medicare HMO

## 2021-05-21 DIAGNOSIS — H401132 Primary open-angle glaucoma, bilateral, moderate stage: Secondary | ICD-10-CM | POA: Diagnosis not present

## 2021-05-22 ENCOUNTER — Other Ambulatory Visit: Payer: Self-pay | Admitting: *Deleted

## 2021-05-22 NOTE — Patient Outreach (Signed)
[place] [facility]) Care Management  05/22/2021  Robert Chan 31-May-1956 [id number]   Initial telephone outreach in response for a referral from Dr. Asencion Noble. Call went unanswered but left a message to return call. Left NP phone number and advised his primary care nurse will be Robert Poling, RN.  Will advise Ms. Gibbs to schedule for his follow up call.  [name]. [name], MSN, [facility] Gerontological Nurse Practitioner [facility] Care Management [phone]

## 2021-05-26 ENCOUNTER — Ambulatory Visit: Payer: Medicare HMO

## 2021-05-27 ENCOUNTER — Other Ambulatory Visit: Payer: Self-pay | Admitting: *Deleted

## 2021-05-27 NOTE — Patient Outreach (Signed)
[place] [facility]) Care Management  [place]  05/27/2021   Rinaldo Scharpf Jul 03, 1956 [id number]  Subjective: New pt referred by Dr. Joya Gaskins for [place] Management servcies. Pt has hx of COPD, Morbid Obesity, HTN, hyperlipidemia, Major Depression, Hx NSTEMI  Encounter Medications:  Outpatient Encounter Medications as of 05/27/2021  Medication Sig   albuterol (VENTOLIN HFA) 108 (90 Base) MCG/ACT inhaler Inhale 1-2 puffs into the lungs every 6 (six) hours as needed for wheezing or shortness of breath.   ARIPiprazole (ABILIFY) 10 MG tablet Take 1 tablet (10 mg total) by mouth at bedtime.   aspirin 81 MG EC tablet SMARTSIG:1 Tablet(s) By Mouth Daily   atorvastatin (LIPITOR) 80 MG tablet Take 1 tablet (80 mg total) by mouth daily.   buPROPion (WELLBUTRIN XL) 150 MG 24 hr tablet Take 1 tablet (150 mg total) by mouth every morning.   diltiazem (TIAZAC) 240 MG 24 hr capsule Take 1 capsule (240 mg total) by mouth daily.   furosemide (LASIX) 20 MG tablet Take 1 tablet (20 mg total) by mouth daily.   hydrOXYzine (ATARAX/VISTARIL) 25 MG tablet Take 1 tablet (25 mg total) by mouth 3 (three) times daily as needed for anxiety.   latanoprost (XALATAN) 0.005 % ophthalmic solution Place 1 drop into both eyes nightly.   SYMBICORT 160-4.5 MCG/ACT inhaler Inhale 2 puffs into the lungs in the morning and at bedtime.   traZODone (DESYREL) 50 MG tablet Take 0.5 tablets (25 mg total) by mouth at bedtime as needed for sleep.   valsartan-hydrochlorothiazide (DIOVAN-HCT) 160-25 MG tablet Take 1 tablet by mouth daily.   Vitamin D, Ergocalciferol, (DRISDOL) 1.25 MG (50000 UNIT) CAPS capsule Take 1 capsule (50,000 Units total) by mouth once a week.   albuterol (PROVENTIL) (2.5 MG/3ML) 0.083% nebulizer solution Take 3 mLs (2.5 mg total) by nebulization every 6 (six) hours as needed for wheezing or shortness of breath. (Patient not taking: Reported on 05/27/2021)   [DISCONTINUED] amLODipine (NORVASC)  10 MG tablet Take 1 tablet (10 mg total) by mouth daily.   No facility-administered encounter medications on file as of 05/27/2021.    Functional Status:  In your present state of health, do you have any difficulty performing the following activities: 05/27/2021 06/05/2020  Hearing? N -  Vision? N -  Difficulty concentrating or making decisions? N -  Walking or climbing stairs? Y -  Comment Due to obesity and COPD -  Dressing or bathing? N -  Doing errands, shopping? Y N  Preparing Food and eating ? N -  Using the Toilet? N -  In the past six months, have you accidently leaked urine? N -  Do you have problems with loss of bowel control? N -  Managing your Medications? N -  Managing your Finances? N -  Housekeeping or managing your Housekeeping? N -  Some encounter information is confidential and restricted. Go to Review Flowsheets activity to see all data.  Some recent data might be hidden    Fall/Depression Screening: Fall Risk  05/27/2021 04/15/2021 12/25/2020  Falls in the past year? 0 0 0  Number falls in past yr: 0 0 0  Injury with Fall? 0 0 0  Risk for fall due to : Impaired mobility;Medication side effect - -  Follow up Falls evaluation completed - -   PHQ 2/9 Scores 05/27/2021 04/15/2021 02/26/2021 12/25/2020 12/25/2020 09/29/2020 07/16/2020  PHQ - 2 Score '2 1 1 2 '$ 0 1 3  PHQ- 9 Score 2 - 7 9 -  3 15    Assessment: COPD - NEEDS NEBULIZER  Care Plan   Goals Addressed               This Visit's Progress     Patient Stated     Weight (lb) < 10 lb (4.5 kg) (pt-stated)        Pt wants to lose weight and would like to start with a goal of -10# over next 3 months. Current wt is 290#.      Other     Disease Progression Minimized or Managed by assuring pt has nebulizer within the next 30 days.        Evidence-based guidance:  Assess symptom control by the frequency and type of symptoms, reliever use and activity limitation at every encounter.  Assess risk for exacerbation  (flare up) by evaluating spirometry, pulse oximetry, reliever use, presentation of symptoms and activity limitation; anticipate treatment adjustment based on risks and resources.  Develop and/or review and reinforce use of COPD rescue (action) plan even when symptoms are controlled or infrequent.  Ask patient to bring inhaler to all visits; assess and reinforce correct technique; address barriers to proper inhaler use, such as older age, use of multiple devices and lack of understanding.   Identify symptom triggers, such as smoking, virus, weather change, emotional upset, exercise, obesity and environmental allergen; consider reduction of work-exposure versus elimination to avoid compromising employment.  Correlate presentation to comorbidity, such as diabetes, heart failure, obstructive sleep apnea, depression and anxiety, which may worsen symptoms.  Promote physical activity or exercise to improve or maintain exercise capacity, based on tolerance that may include walking, water exercise, cycling or limb muscle strength training.  Screen for obstructive sleep apnea; prepare patient for polysomnography based on risk and presentation.  Prepare patient for use of long-term oxygen and noninvasive ventilation to relieve hypercapnia, hypoxemia, obstructive sleep apnea and reduce work of breathing.  Prepare patient with worsening disease for surgical interventions that may include bronchoscopy, lung volume reduction surgery, bullectomy or lung transplantation.   Notes: Pt requests nebulizer today which he says he never has had. NP advised pulmonologist of pt request. He has the medication.      Pt to learn and follow COPD Action Plan over the next 3 months as evidenced by no ED visits for COPD        Start date 05/27/21 High Priority Long Term Goal Expected end date: 08/31/21 Barriers: Health Behaviors Knowledge  05/27/21 No knowledge of COPD Action Plan. Gave simple instructions that if pt feels good he  is in the GREEN zone, if he is having some mild-moderate breathing problems he is in the [place] and needs to contact his MD or nurse for instructions. Don't want to get in the RED ZONE. Will provide [facility] Calendar with Action Plan.        Plan: NP to advise assigned nurse of initiation of care plan. Messaged pulmonology of pt need for nebulizer. Follow-up: Patient agrees to Care Plan and Follow-up. Follow-up in 1 week(s)  Kayleen Memos C. [name], MSN, [facility] Gerontological Nurse Practitioner [facility] Care Management [phone]

## 2021-06-04 ENCOUNTER — Other Ambulatory Visit: Payer: Self-pay | Admitting: *Deleted

## 2021-06-04 NOTE — Patient Outreach (Signed)
[facility] [facility]) Care Management  06/04/2021  Demarr Kangas [birth year] [name]   [facility] unsuccessful outreach to MD referred complex care patient  Mr Ederson Shiao was referred to [facility] on  By Dr Joya Gaskins for [facility] care management services    [facility] Unsuccessful outreach   Outreach attempt to the home number [phone] No answer. [facility] RN CM left HIPAA [facility] Portability and Accountability Act) compliant voicemail message along with CM's contact info.   Plan: [facility] RN CM scheduled this patient for another call attempt within 4-7 business days Unsuccessful outreach on 06/04/21   Curby Carswell L. Lavina Hamman, RN, BSN, [place] Coordinator Office number [phone] Mobile number [phone]  Main [facility] number [phone] Fax number [phone]

## 2021-06-08 ENCOUNTER — Other Ambulatory Visit: Payer: Self-pay

## 2021-06-08 ENCOUNTER — Ambulatory Visit (INDEPENDENT_AMBULATORY_CARE_PROVIDER_SITE_OTHER): Payer: Medicare HMO | Admitting: Podiatry

## 2021-06-08 DIAGNOSIS — L989 Disorder of the skin and subcutaneous tissue, unspecified: Secondary | ICD-10-CM | POA: Diagnosis not present

## 2021-06-08 DIAGNOSIS — M79675 Pain in left toe(s): Secondary | ICD-10-CM | POA: Diagnosis not present

## 2021-06-08 DIAGNOSIS — B351 Tinea unguium: Secondary | ICD-10-CM | POA: Diagnosis not present

## 2021-06-08 DIAGNOSIS — M79674 Pain in right toe(s): Secondary | ICD-10-CM | POA: Diagnosis not present

## 2021-06-09 ENCOUNTER — Other Ambulatory Visit: Payer: Self-pay | Admitting: *Deleted

## 2021-06-09 NOTE — Patient Outreach (Signed)
[place] [facility]) Care Management  06/09/2021  Atharva Kohen 1956-09-03 [name]   [facility] follow up outreach for MD referred patient   HIPAA identifiers completed  He reports he is noting worsening Chronic obstructive pulmonary disease (COPD) symptoms as evidence by having to use his inhaler more than usual.  During RN CM absence on 05/27/21 outreach was made to the primary care provider (PCP) office related to a nebulizer  Not received  RN CM spoke with [facility] about pt requests for assistance with a nebulizer, BP cuff, pulse oximeter and refilling his medications Monica and RN CM reviewed medications to confirm refills for all medicines except trazodone. Monica to check with Dr Joya Gaskins to see if it needs refilling  Pt has 06/15/21 follow up with Dr Raphael Gibney with [facility] at [place] [phone] to confirm pt has refills. Reviewed the list of medicines for refill  Pt will get a notification to pick up medications per [facility]   Goals Addressed               This Visit's Progress     Patient Stated     Disease Progression Minimized or Managed by assuring pt has nebulizer within the next 30 days.([facility]) (pt-stated)   Not on track        Notes:  06/09/21 Pt reports he has not received a nebulizer 05/27/21 Pt requests nebulizer today which he says he never has had. NP advised pulmonologist of pt request. He has the medication.      Pt to learn and follow COPD Action Plan over the next 3 months as evidenced by no ED visits for COPD [facility]) (pt-stated)   On track     Start date 05/27/21 High Priority Long Term Goal Expected end date: 08/31/21 Barriers: Health Behaviors Knowledge  06/09/21 Mr Parello reports doing fair related to reported increase use of his inhaler. He inquires about a nebulizer. Received [facility] welcome package with COPD action plan but has not reviewed. Found it and reports he will review it. Voiced understanding that his COPD is simple chronic bronchitis,  purse breathing, use of anxiety medicines. Denies smoking and home irritants  06/04/21 unsuccessful outreach 05/27/21 No knowledge of COPD Action Plan. Gave simple instructions that if pt feels good he is in the GREEN zone, if he is having some mild-moderate breathing problems he is in the [place] and needs to contact his MD or nurse for instructions. Don't want to get in the RED ZONE. Will provide [facility] Calendar with Action Plan.      Weight (lb) < 10 lb (4.5 kg) (pt-stated)        ([facility]) Pt wants to lose weight and would like to start with a goal of -10# over next 3 months. Current wt is 290#.           Patient Active Problem List   Diagnosis Date Noted   Atopic dermatitis 04/22/2021   Status post incision and drainage 04/22/2021   Bilateral lower extremity edema 02/27/2021   PAD (peripheral artery disease) ([place]) 12/25/2020   Coronary vasospasm ([place]) 10/07/2020   Former tobacco use    MDD (major depressive disorder), recurrent episode, severe ([place]) 09/08/2020   Vitamin D deficiency 06/23/2020   Coronary artery disease 06/19/2020   Homelessness 06/19/2020   History of non-ST elevation myocardial infarction (NSTEMI)    Foot callus 12/12/2019   Elevated serum creatinine 08/28/2019   Age-related nuclear cataract of right eye 09/13/2016   Benign hypertension 07/06/2016  Glaucoma 07/06/2016   Male erectile disorder 07/06/2016   Asthma 10/18/2012   COPD (chronic obstructive pulmonary disease) ([place]) 10/18/2012   Current Outpatient Medications on File Prior to Visit  Medication Sig Dispense Refill   albuterol (PROVENTIL) (2.5 MG/3ML) 0.083% nebulizer solution Take 3 mLs (2.5 mg total) by nebulization every 6 (six) hours as needed for wheezing or shortness of breath. (Patient not taking: Reported on 05/27/2021) 150 mL 1   albuterol (VENTOLIN HFA) 108 (90 Base) MCG/ACT inhaler Inhale 1-2 puffs into the lungs every 6 (six) hours as needed for wheezing or shortness of breath. 90 g 2    ARIPiprazole (ABILIFY) 10 MG tablet Take 1 tablet (10 mg total) by mouth at bedtime. 30 tablet 3   aspirin 81 MG EC tablet SMARTSIG:1 Tablet(s) By Mouth Daily     atorvastatin (LIPITOR) 80 MG tablet Take 1 tablet (80 mg total) by mouth daily. 90 tablet 3   buPROPion (WELLBUTRIN XL) 150 MG 24 hr tablet Take 1 tablet (150 mg total) by mouth every morning. 60 tablet 3   diltiazem (TIAZAC) 240 MG 24 hr capsule Take 1 capsule (240 mg total) by mouth daily. 90 capsule 2   furosemide (LASIX) 20 MG tablet Take 1 tablet (20 mg total) by mouth daily. 30 tablet 1   hydrOXYzine (ATARAX/VISTARIL) 25 MG tablet Take 1 tablet (25 mg total) by mouth 3 (three) times daily as needed for anxiety. 60 tablet 1   latanoprost (XALATAN) 0.005 % ophthalmic solution Place 1 drop into both eyes nightly. 2.5 mL 3   SYMBICORT 160-4.5 MCG/ACT inhaler Inhale 2 puffs into the lungs in the morning and at bedtime. 10.2 g 11   traZODone (DESYREL) 50 MG tablet Take 0.5 tablets (25 mg total) by mouth at bedtime as needed for sleep. 60 tablet 0   valsartan-hydrochlorothiazide (DIOVAN-HCT) 160-25 MG tablet Take 1 tablet by mouth daily. 60 tablet 2   Vitamin D, Ergocalciferol, (DRISDOL) 1.25 MG (50000 UNIT) CAPS capsule Take 1 capsule (50,000 Units total) by mouth once a week. 12 capsule 1   [DISCONTINUED] amLODipine (NORVASC) 10 MG tablet Take 1 tablet (10 mg total) by mouth daily. 90 tablet 1   No current facility-administered medications on file prior to visit.     Plan Patient agrees to care plan and follow up within the next 7-14 business days   Derriona Branscom L. Lavina Hamman, RN, BSN, [place] Coordinator Office number [phone] Main [facility] number [phone] Fax number [phone]

## 2021-06-09 NOTE — Progress Notes (Signed)
    Subjective: Patient is a 65 y.o. male presenting to the office today with a chief complaint of painful callus lesion(s) noted to the left foot has been present for several months Patient also complains of elongated, thickened nails that cause pain while ambulating in shoes.  He is unable to trim his own nails. Patient presents today for further treatment and evaluation.  Past Medical History:  Diagnosis Date   Acute bilateral low back pain without sciatica 02/27/2021   Asthma    CAD (coronary artery disease)    Cataract    CKD (chronic kidney disease)    Cocaine use disorder, mild, abuse (HCC)    COPD (chronic obstructive pulmonary disease) (HCC)    Depression    Hypertension    NSTEMI (non-ST elevated myocardial infarction) ([place])    Suicidal ideation 06/05/2020    Objective:  Physical Exam General: Alert and oriented x3 in no acute distress  Dermatology: Hyperkeratotic lesion(s) present on the left foot. Pain on palpation with a central nucleated core noted. Skin is warm, dry and supple bilateral lower extremities. Negative for open lesions or macerations. Nails are tender, long, thickened and dystrophic with subungual debris, consistent with onychomycosis, 1-5 bilateral. No signs of infection noted.  Vascular: Palpable pedal pulses bilaterally. No edema or erythema noted. Capillary refill within normal limits.  Neurological: Epicritic and protective threshold grossly intact bilaterally.   Musculoskeletal Exam: Pain on palpation at the keratotic lesion(s) noted. Range of motion within normal limits bilateral. Muscle strength 5/5 in all groups bilateral.  Assessment: 1. Onychodystrophic nails 1-5 bilateral with hyperkeratosis of nails.  2. Onychomycosis of nail due to dermatophyte bilateral 3.  Preulcerative callus lesion to the subfifth MTPJ left foot   Plan of Care:  1. Patient evaluated. 2. Excisional debridement of keratoic lesion(s) using a chisel blade was performed  without incident.  3. Dressed with light dressing. 4. Mechanical debridement of nails 1-5 bilaterally performed using a nail nipper. Filed with dremel without incident.  5. Patient is to return to the clinic in 3 months.   Edrick Kins, DPM [facility]  Dr. Edrick Kins, DPM    [address], [place] [address]                Office [phone]  Fax [phone]

## 2021-06-11 ENCOUNTER — Telehealth: Payer: Self-pay | Admitting: Critical Care Medicine

## 2021-06-11 DIAGNOSIS — J410 Simple chronic bronchitis: Secondary | ICD-10-CM

## 2021-06-11 DIAGNOSIS — J449 Chronic obstructive pulmonary disease, unspecified: Secondary | ICD-10-CM

## 2021-06-11 NOTE — Telephone Encounter (Signed)
Rx for home nebulizer sent Medicare will not pay for a pulse oximeter  You can get pulse ox at [facility] for 10-15 dollars

## 2021-06-11 NOTE — Telephone Encounter (Signed)
Copied from [place] [phone]. Topic: General - Other >> Jun 09, 2021  4:04 PM Pawlus, Brayton Layman A wrote: Reason for CRM: Robert Chan was calling to follow up regarding the pts nebulizer, pt still has not received this. Pt is also needing a pulse oximeter, please advise  Patient has appt with Dr. Joya Gaskins 8/15

## 2021-06-11 NOTE — Telephone Encounter (Signed)
call placed to patient and informed him that Dr Joya Gaskins has written an order for the nebulizer.  He was in agreement to having the order sent to [facility], no preference for DME companies.  Also informed him that Medicare will not pay for a pulse oximeter, he can get pulse ox at [facility] for 10-15 dollars and he said he understood.
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
## 2021-06-15 ENCOUNTER — Other Ambulatory Visit: Payer: Self-pay

## 2021-06-15 ENCOUNTER — Encounter: Payer: Self-pay | Admitting: Critical Care Medicine

## 2021-06-15 ENCOUNTER — Telehealth: Payer: Self-pay | Admitting: Critical Care Medicine

## 2021-06-15 ENCOUNTER — Ambulatory Visit: Payer: Medicare HMO | Attending: Critical Care Medicine | Admitting: Critical Care Medicine

## 2021-06-15 VITALS — BP 116/83 | HR 71 | Temp 98.0°F | Ht 73.0 in | Wt 289.2 lb

## 2021-06-15 DIAGNOSIS — I201 Angina pectoris with documented spasm: Secondary | ICD-10-CM

## 2021-06-15 DIAGNOSIS — Z9889 Other specified postprocedural states: Secondary | ICD-10-CM

## 2021-06-15 DIAGNOSIS — L209 Atopic dermatitis, unspecified: Secondary | ICD-10-CM

## 2021-06-15 DIAGNOSIS — Z5900 Homelessness unspecified: Secondary | ICD-10-CM

## 2021-06-15 DIAGNOSIS — I10 Essential (primary) hypertension: Secondary | ICD-10-CM

## 2021-06-15 DIAGNOSIS — R600 Localized edema: Secondary | ICD-10-CM

## 2021-06-15 DIAGNOSIS — R7989 Other specified abnormal findings of blood chemistry: Secondary | ICD-10-CM

## 2021-06-15 DIAGNOSIS — E782 Mixed hyperlipidemia: Secondary | ICD-10-CM | POA: Diagnosis not present

## 2021-06-15 DIAGNOSIS — I2584 Coronary atherosclerosis due to calcified coronary lesion: Secondary | ICD-10-CM | POA: Diagnosis not present

## 2021-06-15 DIAGNOSIS — Z1211 Encounter for screening for malignant neoplasm of colon: Secondary | ICD-10-CM

## 2021-06-15 DIAGNOSIS — E559 Vitamin D deficiency, unspecified: Secondary | ICD-10-CM

## 2021-06-15 DIAGNOSIS — L84 Corns and callosities: Secondary | ICD-10-CM

## 2021-06-15 DIAGNOSIS — J410 Simple chronic bronchitis: Secondary | ICD-10-CM

## 2021-06-15 DIAGNOSIS — H4020X0 Unspecified primary angle-closure glaucoma, stage unspecified: Secondary | ICD-10-CM | POA: Diagnosis not present

## 2021-06-15 DIAGNOSIS — I2510 Atherosclerotic heart disease of native coronary artery without angina pectoris: Secondary | ICD-10-CM | POA: Diagnosis not present

## 2021-06-15 DIAGNOSIS — J449 Chronic obstructive pulmonary disease, unspecified: Secondary | ICD-10-CM

## 2021-06-15 MED ORDER — VALSARTAN-HYDROCHLOROTHIAZIDE 160-25 MG PO TABS: 1.0000 | ORAL_TABLET | Freq: Every day | ORAL | 2 refills | Status: DC

## 2021-06-15 MED ORDER — DILTIAZEM HCL ER BEADS 240 MG PO CP24: 240.0000 mg | ORAL_CAPSULE | Freq: Every day | ORAL | 2 refills | Status: DC

## 2021-06-15 MED ORDER — BUPROPION HCL ER (XL) 150 MG PO TB24: 150.0000 mg | ORAL_TABLET | Freq: Every morning | ORAL | 3 refills | Status: DC

## 2021-06-15 MED ORDER — LATANOPROST 0.005 % OP SOLN: OPHTHALMIC | 3 refills | Status: DC

## 2021-06-15 MED ORDER — SYMBICORT 160-4.5 MCG/ACT IN AERO: 2.0000 | INHALATION_SPRAY | Freq: Two times a day (BID) | RESPIRATORY_TRACT | 11 refills | Status: DC

## 2021-06-15 MED ORDER — ATORVASTATIN CALCIUM 40 MG PO TABS: 40.0000 mg | ORAL_TABLET | Freq: Every day | ORAL | 1 refills | Status: DC

## 2021-06-15 MED ORDER — HYDROXYZINE HCL 25 MG PO TABS: 25.0000 mg | ORAL_TABLET | Freq: Three times a day (TID) | ORAL | 1 refills | Status: DC | PRN

## 2021-06-15 MED ORDER — ATORVASTATIN CALCIUM 80 MG PO TABS: 80.0000 mg | ORAL_TABLET | Freq: Every day | ORAL | 3 refills | Status: DC

## 2021-06-15 MED ORDER — ARIPIPRAZOLE 10 MG PO TABS: 10.0000 mg | ORAL_TABLET | Freq: Every day | ORAL | 3 refills | Status: DC

## 2021-06-15 MED ORDER — VITAMIN D (ERGOCALCIFEROL) 1.25 MG (50000 UNIT) PO CAPS: 50000.0000 [IU] | ORAL_CAPSULE | ORAL | 1 refills | Status: DC

## 2021-06-15 NOTE — Assessment & Plan Note (Signed)
Atopic dermatitis improved

## 2021-06-15 NOTE — Assessment & Plan Note (Signed)
Coronary vasospasm has resolved with cessation of cocaine

## 2021-06-15 NOTE — Assessment & Plan Note (Signed)
No longer smoking and stable on Symbicort

## 2021-06-15 NOTE — Assessment & Plan Note (Signed)
Continue vitamin D supplementation 

## 2021-06-15 NOTE — Assessment & Plan Note (Signed)
Follow-up metabolic profile

## 2021-06-15 NOTE — Assessment & Plan Note (Signed)
Refills on glaucoma medications given

## 2021-06-15 NOTE — Telephone Encounter (Signed)
[facility] is calling because they received a request for pt's nebulizer. For approval they are requesting a copy of the pt's office notes for the visit and also a list of pt's current medications.   Please fax to the number below.    [facility] FaxND:[phone] Phone: [phone] -- [phone]

## 2021-06-15 NOTE — Patient Instructions (Signed)
Refills on all your medications sent to your pharmacy  No change in medication dosing  Use the skin moisturizer cream to apply to your feet once daily after showering or baths to help moisturize the feet  Cologuard kit will be mailed to your home for colon cancer screening  Labs today include liver function panel and cholesterol  Nebulizer machine will be sent to you once approved by insurance  Return to see Dr. Joya Gaskins 5 months

## 2021-06-15 NOTE — Assessment & Plan Note (Signed)
Foot callus markedly better I gave the patient samples of foot cream

## 2021-06-15 NOTE — Assessment & Plan Note (Signed)
Lower back wound completely healed

## 2021-06-15 NOTE — Assessment & Plan Note (Signed)
Hypertension well controlled at this time no change in medications refills given

## 2021-06-15 NOTE — Assessment & Plan Note (Signed)
Off furosemide lower extremity edema resolved

## 2021-06-15 NOTE — Assessment & Plan Note (Signed)
Currently has an apartment with [employer] support

## 2021-06-16 ENCOUNTER — Other Ambulatory Visit: Payer: Self-pay | Admitting: *Deleted

## 2021-06-16 LAB — LIPID PANEL
Chol/HDL Ratio: 3.2 ratio (ref 0.0–5.0)
Cholesterol, Total: 162 mg/dL (ref 100–199)
HDL: 50 mg/dL (ref >39)
LDL Chol Calc (NIH): 96 mg/dL (ref 0–99)
Triglycerides: 84 mg/dL (ref 0–149)
VLDL Cholesterol Cal: 16 mg/dL (ref 5–40)

## 2021-06-16 LAB — HEPATIC FUNCTION PANEL
ALT: 23 IU/L (ref 0–44)
AST: 16 IU/L (ref 0–40)
Albumin: 4.1 g/dL (ref 3.8–4.8)
Alkaline Phosphatase: 65 IU/L (ref 44–121)
Bilirubin Total: 0.9 mg/dL (ref 0.0–1.2)
Bilirubin, Direct: 0.18 mg/dL (ref 0.00–0.40)
Total Protein: 7 g/dL (ref 6.0–8.5)

## 2021-06-16 NOTE — Telephone Encounter (Signed)
Office notes has been faxed. 

## 2021-06-16 NOTE — Patient Outreach (Signed)
[facility] [facility]) Care Management  06/16/2021  Robert Chan 1956-05-11 [name]   [facility] follow up outreach for MD referred patient   Robert Robert Chan was referred to [facility] by his MD (wright) on 05/27/21     He is able to provided HIPAA identifiers   Assessment  Robert Robert Chan confirms he was seen by Dr Joya Gaskins 06/15/21    Assessment and education of COPD Robert Robert Chan reminded he is listed with asthma and chronic bronchitis COPD since 10/2012 Discussed GERD, OSA, irritants+   Walking and go to [facility] for 4-5 days for 30 minutes is his exercise plan  Noticing increase coughing using cough medicines assisted with symptoms  Encourage cleaning nares frequently and hydration Voiced understanding of all COPD education provided today   Patient Active Problem List   Diagnosis Date Noted   Atopic dermatitis 04/22/2021   Former tobacco use    MDD (major depressive disorder), recurrent episode, severe ([place]) 09/08/2020   Vitamin D deficiency 06/23/2020   Coronary artery disease 06/19/2020   Homelessness 06/19/2020   History of non-ST elevation myocardial infarction (NSTEMI)    Foot callus 12/12/2019   Elevated serum creatinine 08/28/2019   Age-related nuclear cataract of right eye 09/13/2016   Benign hypertension 07/06/2016   Glaucoma 07/06/2016   Male erectile disorder 07/06/2016   Asthma 10/18/2012   COPD with chronic bronchitis ([place]) 10/18/2012    Plan Patient agrees to care plan and follow up within the next 14-21 business days    Goals Addressed               This Visit's Progress     Patient Stated     Disease Progression Minimized or Managed by assuring pt has nebulizer within the next 30 days.([facility]) (pt-stated)   On track     Barriers: Knowledge  Notes:  06/16/21 Doing better He is still pending assistance with a nebulizer, BP cuff, pulse oximeter but completed the refilling of his medications With review of EPIC notes there is indication that [employer]  was sent MD notes for further processing of a nebulizer on today. Pt updated  Actively participated and completed teach back with COPD education provided today 06/09/21 Pt reports he has not received a nebulizer 05/27/21 Pt requests nebulizer today which he says he never has had. NP advised pulmonologist of pt request. He has the medication.      Pt to learn and follow COPD Action Plan over the next 3 months as evidenced by no ED visits for COPD [facility]) (pt-stated)   On track     Start date 05/27/21 High Priority Long Term Goal Expected end date: 08/31/21 Barriers: Health Behaviors Knowledge   06/16/21 decrease use of inhaler, no audible wheezing, use of cough medicine assisted, completed office visit with Dr Joya Gaskins recently Able to participate when reviewed COPD action plan, No ED/hospital visit since 05/27/21 06/09/21 Robert Chan reports doing fair related to reported increase use of his inhaler. He inquires about a nebulizer. Received [facility] welcome package with COPD action plan but has not reviewed. Found it and reports he will review it. Voiced understanding that his COPD is simple chronic bronchitis, purse breathing, use of anxiety medicines. Denies smoking and home irritants  06/04/21 unsuccessful outreach 05/27/21 No knowledge of COPD Action Plan. Gave simple instructions that if pt feels good he is in the GREEN zone, if he is having some mild-moderate breathing problems he is in the [place] and needs to contact his MD or nurse for  instructions. Don't want to get in the RED ZONE. Will provide [facility] Calendar with Action Plan.          Toini Failla L. Lavina Hamman, RN, BSN, [place] Coordinator Office number [phone] Main [facility] number [phone] Fax number [phone]

## 2021-06-17 ENCOUNTER — Other Ambulatory Visit: Payer: Self-pay

## 2021-06-24 DIAGNOSIS — I83813 Varicose veins of bilateral lower extremities with pain: Secondary | ICD-10-CM | POA: Diagnosis not present

## 2021-06-24 DIAGNOSIS — R21 Rash and other nonspecific skin eruption: Secondary | ICD-10-CM | POA: Diagnosis not present

## 2021-06-24 DIAGNOSIS — L0100 Impetigo, unspecified: Secondary | ICD-10-CM | POA: Diagnosis not present

## 2021-06-24 DIAGNOSIS — L308 Other specified dermatitis: Secondary | ICD-10-CM | POA: Diagnosis not present

## 2021-06-24 DIAGNOSIS — D485 Neoplasm of uncertain behavior of skin: Secondary | ICD-10-CM | POA: Diagnosis not present

## 2021-06-25 ENCOUNTER — Telehealth: Payer: Self-pay

## 2021-06-25 ENCOUNTER — Other Ambulatory Visit: Payer: Self-pay

## 2021-06-25 NOTE — Telephone Encounter (Signed)
Hydroxyzine tablet PA denied due to no history of trial and failure of TWO of the following: buspirone, duloxetine, escitalopram,sertraline or velafaxine

## 2021-07-02 ENCOUNTER — Other Ambulatory Visit: Payer: Self-pay

## 2021-07-02 ENCOUNTER — Other Ambulatory Visit: Payer: Self-pay | Admitting: *Deleted

## 2021-07-02 ENCOUNTER — Encounter: Payer: Self-pay | Admitting: *Deleted

## 2021-07-02 LAB — COLOGUARD

## 2021-07-02 NOTE — Patient Outreach (Addendum)
[place] [facility]) Care Management  07/02/2021  Robert Chan Aug 22, 1956 [id number]   [facility] follow up outreach for MD referred patient   Robert Chan was referred to [facility] by his MD Robert Chan) on 05/27/21   Insurance  A[employer]na medicare and medicaid  He is able to provided HIPAA identifiers  Updated address in EPIC from a listed [place], [place] (where he grew up at) to his local address   Follow up assessment/Present Concern  Still pending delivery of his nebulizer  He is aware he is using inhaler too much vs the pending nebulizer He received a call about its delivery but has not seen or heard from anyone since the week of June 15 2021  Completed a conference call with patient to [employer] [address] with Robert Chan notes nebulizer was shipped to his local [place] apartment address on Wednesday, June 17 2021 but could not find a final documentation Robert Chan reached out to [place] branch for verification  She confirmed there is a possibility it has not been delivered She updated patient that she spoke with [place] staff The item is to be delivered by [employer] Robert Chan informs Robert Chan he should receive a call from [place] staff in 24-48 hours.  Robert Chan reports he will updated RN CM if he does not receive a call in the next 24 hours as the Labor day holiday is next week.   He is noted to cough at intervals Assessment reveals he is having productive cough of only white sputum RN CM reviewed the action plan for him to call Pulmonology office if he has to use his inhaler excessively,  He likes to walk a lot for exercise  He confirms he is no longer smoking  Plan Patient agrees to care plan and follow up within the next 14-21 business days   Goals Addressed               This Visit's Progress     Patient Stated     Disease Progression Minimized or Managed by assuring pt has nebulizer within the next 30 days.([facility]) (pt-stated)    Not on track     Barriers: Knowledge  Notes:  07/02/21 Still pending delivery of nebulizer by [employer], completed conference call with RN CM to [employer] 06/16/21 Doing better He is still pending assistance with a nebulizer, BP cuff, pulse oximeter but completed the refilling of his medications With review of EPIC notes there is indication that [employer] was sent MD notes for further processing of a nebulizer on today. Pt updated  Actively participated and completed teach back with COPD education provided today 06/09/21 Pt reports he has not received a nebulizer 05/27/21 Pt requests nebulizer today which he says he never has had. NP advised pulmonologist of pt request. He has the medication.      Pt to learn and follow COPD Action Plan over the next 3 months as evidenced by no ED visits for COPD [facility]) (pt-stated)   On track     Start date 05/27/21 High Priority Long Term Goal Expected end date: 08/31/21 Barriers: Health Behaviors Knowledge  Notes  07/02/21 no ED or admission managing COPD with inhalers and still pending nebulizer delivery 06/16/21 decrease use of inhaler, no audible wheezing, use of cough medicine assisted, completed office visit with Dr Robert Chan recently Able to participate when reviewed COPD action plan, No ED/hospital visit since 05/27/21 06/09/21 Robert Chan reports doing fair related to reported increase use of his inhaler.  He inquires about a nebulizer. Received [facility] welcome package with COPD action plan but has not reviewed. Found it and reports he will review it. Voiced understanding that his COPD is simple chronic bronchitis, purse breathing, use of anxiety medicines. Denies smoking and home irritants  06/04/21 unsuccessful outreach 05/27/21 No knowledge of COPD Action Plan. Gave simple instructions that if pt feels good he is in the GREEN zone, if he is having some mild-moderate breathing problems he is in the [place] and needs to contact his MD or nurse for instructions. Don't want to get  in the RED ZONE. Will provide [facility] Calendar with Action Plan.         Jahniyah [place] L. Lavina Hamman, RN, BSN, [place] Coordinator Office number [phone] Main [facility] number [phone] Fax number [phone]

## 2021-07-03 ENCOUNTER — Telehealth: Payer: Self-pay

## 2021-07-03 DIAGNOSIS — J449 Chronic obstructive pulmonary disease, unspecified: Secondary | ICD-10-CM | POA: Diagnosis not present

## 2021-07-03 NOTE — Telephone Encounter (Signed)
Contacted pt to go over lab results pt didn't answer lvm   Sent a CRM and forward labs to NT to give pt labs when they call back   

## 2021-07-08 ENCOUNTER — Other Ambulatory Visit: Payer: Self-pay | Admitting: *Deleted

## 2021-07-08 NOTE — Patient Outreach (Signed)
[place] [facility]) Care Management  07/08/2021  Robert Chan 04/06/1956 [id number]   [place] coordination- follow up on delivery of DME  Message left by patent on late 07/03/21 reporting he had not heard  Spoke with Mardene Celeste at [phone], [employer] to check on the status of his nebulizer  Reports it was delivered 07/07/21 1641 left at door by [employer]   Outreach to [name] to confirm he has received his nebulizer and is using it  Plan [facility] RN CM will follow up with patient within the next 30 business days  Liese Dizdarevic L. Lavina Hamman, RN, BSN, [place] Coordinator Office number [phone] Mobile number [phone]  Main [facility] number [phone] Fax number [phone]

## 2021-07-09 ENCOUNTER — Ambulatory Visit: Payer: Medicare HMO | Admitting: Critical Care Medicine

## 2021-07-12 ENCOUNTER — Encounter (HOSPITAL_COMMUNITY): Payer: Self-pay | Admitting: Emergency Medicine

## 2021-07-12 ENCOUNTER — Emergency Department (HOSPITAL_COMMUNITY)
Admission: EM | Admit: 2021-07-12 | Discharge: 2021-07-12 | Disposition: A | Discharge status: Home / Self Care | Payer: Medicare HMO | Attending: Emergency Medicine | Admitting: Emergency Medicine

## 2021-07-12 ENCOUNTER — Other Ambulatory Visit: Payer: Self-pay

## 2021-07-12 DIAGNOSIS — J449 Chronic obstructive pulmonary disease, unspecified: Secondary | ICD-10-CM | POA: Insufficient documentation

## 2021-07-12 DIAGNOSIS — R21 Rash and other nonspecific skin eruption: Secondary | ICD-10-CM | POA: Diagnosis not present

## 2021-07-12 DIAGNOSIS — I2510 Atherosclerotic heart disease of native coronary artery without angina pectoris: Secondary | ICD-10-CM | POA: Insufficient documentation

## 2021-07-12 DIAGNOSIS — N189 Chronic kidney disease, unspecified: Secondary | ICD-10-CM | POA: Diagnosis not present

## 2021-07-12 DIAGNOSIS — Z87891 Personal history of nicotine dependence: Secondary | ICD-10-CM | POA: Diagnosis not present

## 2021-07-12 DIAGNOSIS — I129 Hypertensive chronic kidney disease with stage 1 through stage 4 chronic kidney disease, or unspecified chronic kidney disease: Secondary | ICD-10-CM | POA: Diagnosis not present

## 2021-07-12 DIAGNOSIS — J45909 Unspecified asthma, uncomplicated: Secondary | ICD-10-CM | POA: Diagnosis not present

## 2021-07-12 DIAGNOSIS — Z955 Presence of coronary angioplasty implant and graft: Secondary | ICD-10-CM | POA: Diagnosis not present

## 2021-07-12 MED ORDER — PREDNISONE 10 MG (21) PO TBPK: ORAL_TABLET | Freq: Every day | ORAL | 0 refills | Status: DC

## 2021-07-12 NOTE — ED Provider Notes (Signed)
Emergency Department Provider Note   I have reviewed the triage vital signs and the nursing notes.   HISTORY  Chief Complaint No chief complaint on file.   HPI Robert Chan is a 65 y.o. male with past medical history reviewed below presents to the emergency department for evaluation of blistering rash to the hands and feet.  Symptoms been ongoing for several weeks and have not migrated from the palms/soles.  He is not having rash in other location.  No oral lesions or pain.  States the rash is not bleeding or draining.  The area is itchy at times.  He has seen a dermatologist and has a follow-up appointment on 22 December.  He was started on some topical medication but feels the rash is not resolving and would like reevaluation.  He does have plan to continue to follow with the dermatology team.  No other new medications. No fever/chills.    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   Depression    Hypertension    NSTEMI (non-ST elevated myocardial infarction) ([place])    Status post incision and drainage 04/22/2021   Suicidal ideation 06/05/2020    Patient Active Problem List   Diagnosis Date Noted   Atopic dermatitis 04/22/2021   Former tobacco use    MDD (major depressive disorder), recurrent episode, severe ([place]) 09/08/2020   Vitamin D deficiency 06/23/2020   Coronary artery disease 06/19/2020   Homelessness 06/19/2020   History of non-ST elevation myocardial infarction (NSTEMI)    Foot callus 12/12/2019   Elevated serum creatinine 08/28/2019   Age-related nuclear cataract of right eye 09/13/2016   Benign hypertension 07/06/2016   Glaucoma 07/06/2016   Male erectile disorder 07/06/2016    Asthma 10/18/2012   COPD with chronic bronchitis ([place]) 10/18/2012    Past Surgical History:  Procedure Laterality Date   APPENDECTOMY     EYE SURGERY     LEFT HEART CATH AND CORONARY ANGIOGRAPHY N/A 06/05/2020   Procedure: LEFT HEART CATH AND CORONARY ANGIOGRAPHY;  Surgeon: Nigel Mormon, MD;  Location: [place] [facility];  Service: Cardiovascular;  Laterality: N/A;   PROSTATE SURGERY      Allergies Shellfish allergy  Family History  Problem Relation Age of Onset   Hypertension Mother    Diabetes Mother    Hypertension Father    Hypertension Sister     Social History Social History   Tobacco Use   Smoking status: Former    Types: Cigarettes    Quit date: 12/25/2020    Years since quitting: 0.5   Smokeless tobacco: Never   Tobacco comments:    smokes 3-4 cigarettes/day  Vaping Use   Vaping Use: Never used  Substance Use Topics   Alcohol use: Yes    Comment: drinks 2x/wk, 2-3 40 oz drinks, last drink Friday   Drug use: Yes    Types: Cocaine    Comment: cocaine use once a month, last use Friday     Review of Systems  Constitutional: No fever/chills ENT: No sore throat. No oral lesions.  Cardiovascular: Denies chest pain. Respiratory: Denies shortness of breath. Gastrointestinal: No abdominal pain.   Musculoskeletal: Negative for back  pain. Skin: Blistering rash to palms/soles.  Neurological: Negative for headache.  10-point ROS otherwise negative.  ____________________________________________   PHYSICAL EXAM:  VITAL SIGNS: ED Triage Vitals  Enc Vitals Group     BP 07/12/21 1154 112/78     Pulse Rate 07/12/21 1154 79     Resp 07/12/21 1154 16     Temp 07/12/21 1154 98.5 F (36.9 C)     Temp Source 07/12/21 1154 Oral     SpO2 07/12/21 1154 94 %   Constitutional: Alert and oriented. Well appearing and in no acute distress. Eyes: Conjunctivae are normal.  Head: Atraumatic. Nose: No congestion/rhinnorhea. Mouth/Throat: Mucous membranes are  moist.  Neck: No stridor.  Cardiovascular: Normal rate, regular rhythm. Good peripheral circulation. Grossly normal heart sounds.   Respiratory: Normal respiratory effort.  No retractions. Lungs CTAB. Gastrointestinal: Soft and nontender. No distention.  Musculoskeletal: No lower extremity tenderness nor edema. No gross deformities of extremities. Neurologic:  Normal speech and language. No gross focal neurologic deficits are appreciated.  Skin:  Skin is warm and dry.  Patient with bullae to the bilateral soles of the feet.  These are not flaccid.  There is no petechial component to rash.  No cellulitis or abscess.  There is a small lesion to the palm on the left hand.  No sloughing of skin.  No oral lesions.    ____________________________________________   PROCEDURES  Procedure(s) performed:   Procedures  None  ____________________________________________   INITIAL IMPRESSION / ASSESSMENT AND PLAN / ED COURSE  Pertinent labs & imaging results that were available during my care of the patient were reviewed by me and considered in my medical decision making (see chart for details).   Patient with blistering rash to the soles of the feet.  Some areas to the hand as well.  This does not seem consistent with SJS and TEN. Plan for steroid burst with taper and advised to keep his dermatology follow up.   ____________________________________________  FINAL CLINICAL IMPRESSION(S) / ED DIAGNOSES  Final diagnoses:  Rash    NEW OUTPATIENT MEDICATIONS STARTED DURING THIS VISIT:  New Prescriptions   PREDNISONE (STERAPRED UNI-PAK 21 TAB) 10 MG (21) TBPK TABLET    Take by mouth daily. Take 6 tabs by mouth daily  for 2 days, then 5 tabs for 2 days, then 4 tabs for 2 days, then 3 tabs for 2 days, 2 tabs for 2 days, then 1 tab by mouth daily for 2 days    Note:  This document was prepared using Dragon voice recognition software and may include unintentional dictation errors.  Nanda Quinton,  MD, [facility]    Nishanth Mccaughan, [facility], MD 07/12/21 [phone]

## 2021-07-12 NOTE — ED Triage Notes (Signed)
Pt reports blisters to bottom of feet and palms of hands x 2 weeks that are a little itchy.  Denies pain.

## 2021-07-12 NOTE — Discharge Instructions (Signed)
Please keep your dermatology appointment scheduled for later this month.  I have called in a course of steroid to help with your symptoms.  Please return to the emergency department any worsening rash, fever, confusion, rash developing in the mouth.

## 2021-07-12 NOTE — ED Provider Notes (Signed)
Emergency Medicine Provider Triage Evaluation Note  Robert Chan , a 65 y.o. male  was evaluated in triage.  Pt complains of rash to the palms of the hands and the soles of the feet x2 weeks that is mildly itchy and has not spread anywhere else.  States that he was seen by dermatology who prescribed a medication but it is unclear if he is taking it or not and he does not remember what he was diagnosed with.  States that "it was taking too long" and he wanted to come here to be treated more quickly.  Review of Systems  Positive: Rash Negative: Chest pain or shortness of breath with fevers, chills  Physical Exam  BP 112/78 (BP Location: Left Arm)   Pulse 79   Temp 98.5 F (36.9 C) (Oral)   Resp 16   SpO2 94%  Gen:   Awake, no distress   Resp:  Normal effort  MSK:   Moves extremities without difficulty  Other:  Papular rash to the dorsum and palmar surfaces of the hands as well as reportedly in the feet the patient did not take shoes off in triage.RRR NO m/r/g., Normal Capillary refill in all 5 digits of both hands.  Medical Decision Making  Medically screening exam initiated at 12:55 PM.  Appropriate orders placed.  Robert Chan was informed that the remainder of the evaluation will be completed by another provider, this initial triage assessment does not replace that evaluation, and the importance of remaining in the ED until their evaluation is complete.    This chart was dictated using voice recognition software, Dragon. Despite the best efforts of this provider to proofread and correct errors, errors may still occur which can change documentation meaning.    Robert Chan 07/12/21 1256    Robert Fast, MD 07/19/21 1725

## 2021-07-16 ENCOUNTER — Other Ambulatory Visit: Payer: Self-pay | Admitting: Critical Care Medicine

## 2021-07-16 NOTE — Telephone Encounter (Signed)
Patient called and advised did he call [facility] to ask for the requested refills. He says he only called to ask for cream that the dermatologist sent in and he says they told him to call the dermatologist, so he did. He says he will need Albuterol inhaler and the Atorvastatin, advised he supposed to be taking 40 mg. He asks if [facility] [address] to him. I advised I will refuse the requests and [facility] [address] to [facility] on the requested Albuterol inhaler and Atorvastatin. [facility] [address] [facility] and spoke to [place], [facility] about the patient's request. She says she's able to pull over the Atorvastatin 40 mg and he has refills left on the Albuterol Inhaler that will be available for pickup today.

## 2021-07-17 ENCOUNTER — Other Ambulatory Visit: Payer: Self-pay | Admitting: *Deleted

## 2021-07-17 ENCOUNTER — Other Ambulatory Visit: Payer: Self-pay

## 2021-07-17 NOTE — Patient Outreach (Signed)
[place] [facility]) Care Management  07/17/2021  [name] [birth year] [name]   [facility] follow up outreach for MD referred patient   [name] was referred to [facility] by his MD (wright) on 05/27/21   Insurance  Aetna medicare and medicaid   He is able to provided HIPAA identifiers   Updated address in EPIC from a listed [place], [place] (where he grew up at) to his local address     Follow up assessment 07/12/21 ED rash ointment from dermatologist causes blisters under his feet Dermatologist is a male at [address] [place] He has now received all medicine  Nebulizer received and doing well  EMMI has not checked e-mail unable find in spam  DME no bp cuff  RN CM encouraged him to order from [place] over the counter (OTC) catalog [employer] direct forms are needed  Affordable life insurance is needed per pt   Patient Active Problem List   Diagnosis Date Noted   Atopic dermatitis 04/22/2021   Former tobacco use    MDD (major depressive disorder), recurrent episode, severe ([place]) 09/08/2020   Vitamin D deficiency 06/23/2020   Coronary artery disease 06/19/2020   Homelessness 06/19/2020   History of non-ST elevation myocardial infarction (NSTEMI)    Foot callus 12/12/2019   Elevated serum creatinine 08/28/2019   Age-related nuclear cataract of right eye 09/13/2016   Benign hypertension 07/06/2016   Glaucoma 07/06/2016   Male erectile disorder 07/06/2016   Asthma 10/18/2012   COPD with chronic bronchitis ([place]) 10/18/2012   Plans  Patient agrees to care plan and follow up within the next 21 business days   Tarry Fountain L. Lavina Hamman, RN, BSN, [place] Coordinator Office number [phone] Main [facility] number [phone] Fax number [phone]

## 2021-07-23 ENCOUNTER — Telehealth: Payer: Self-pay

## 2021-07-23 NOTE — Telephone Encounter (Signed)
Call placed to [place], spoke to [place] who confirmed the nebulizer was shipped to the patient on 07/03/2021.

## 2021-08-06 DIAGNOSIS — B353 Tinea pedis: Secondary | ICD-10-CM | POA: Diagnosis not present

## 2021-08-06 DIAGNOSIS — L0101 Non-bullous impetigo: Secondary | ICD-10-CM | POA: Diagnosis not present

## 2021-08-06 DIAGNOSIS — I872 Venous insufficiency (chronic) (peripheral): Secondary | ICD-10-CM | POA: Diagnosis not present

## 2021-08-07 ENCOUNTER — Other Ambulatory Visit: Payer: Self-pay | Admitting: *Deleted

## 2021-08-07 ENCOUNTER — Other Ambulatory Visit: Payer: Self-pay

## 2021-08-07 NOTE — Patient Outreach (Signed)
[place]) Care Management  08/07/2021  Robert Chan 05-Dec-1955 [id number]   [facility] follow up outreach for MD referred patient   Robert Chan was referred to [facility] by his MD Joya Gaskins) on 05/27/21   Insurance  [employer]na medicare and medicaid   He is able to provided HIPAA identifiers   Updated address in EPIC from a listed [place], [place] (where he grew up at) to his local address     Follow up assessment Robert Chan reports he is doing well today  Chronic obstructive pulmonary disease (COPD)  He is noted without audible congestion nor wheezing  He was commended for his good home management  Hypertension (HTN)/Coronary Artery Disease (CAD) Robert Chan reports he is doing some walking for exercises  He does not have a blood pressure (BP) cuff in the home and has been encouraged to obtain one with his Aetna benefit at a local drug store. The importance discussed again today along with risks of hypotension and hypertension  With teach back method he was able to verbalize the importance of scheduling his cancelled pcp appointment for September 2022 and obtaining a BP cuff  Affordable life insurance He has not find Emergency planning/management officer CM has not found resources other than the ones he has mentioned on line    Plan Patient agrees to care plan and follow up within the next 30 business days  Goals Addressed               This Visit's Progress     Patient Stated     COMPLETED: Disease Progression Minimized or Managed by assuring pt has nebulizer within the next 30 days.([facility]) (pt-stated)   On track     Barriers: Knowledge  Notes:  107/22 pt received his nebulizer at the end of September 2022 and has been using it as ordered Today he is noted without audible congestion nor wheezing Goal completed  07/02/21 Still pending delivery of nebulizer by [employer], completed conference call with RN CM to [employer] 06/16/21 Doing better He is still pending assistance with a nebulizer,  BP cuff, pulse oximeter but completed the refilling of his medications With review of EPIC notes there is indication that [employer] was sent MD notes for further processing of a nebulizer on today. Pt updated  Actively participated and completed teach back with COPD education provided today 06/09/21 Pt reports he has not received a nebulizer 05/27/21 Pt requests nebulizer today which he says he never has had. NP advised pulmonologist of pt request. He has the medication.      Pt to learn and follow COPD Action Plan over the next 3 months as evidenced by no ED visits for COPD [facility]) (pt-stated)   On track     Start date 05/27/21 High Priority Long Term Goal Expected end date: 08/31/21 Barriers: Health Behaviors Knowledge  Notes  08/07/21 He is able to verbalize using teach back his COPD action plan and has had not ED visits for COPD since his referral to [facility] in July 2022 07/02/21 no ED or admission managing COPD with inhalers and still pending nebulizer delivery 06/16/21 decrease use of inhaler, no audible wheezing, use of cough medicine assisted, completed office visit with Dr Joya Gaskins recently Able to participate when reviewed COPD action plan, No ED/hospital visit since 05/27/21 06/09/21 Robert Michalski reports doing fair related to reported increase use of his inhaler. He inquires about a nebulizer. Received [facility] welcome package with COPD action plan but has not reviewed. Found it  and reports he will review it. Voiced understanding that his COPD is simple chronic bronchitis, purse breathing, use of anxiety medicines. Denies smoking and home irritants  06/04/21 unsuccessful outreach 05/27/21 No knowledge of COPD Action Plan. Gave simple instructions that if pt feels good he is in the GREEN zone, if he is having some mild-moderate breathing problems he is in the [place] and needs to contact his MD or nurse for instructions. Don't want to get in the RED ZONE. Will provide [facility] Calendar with Action Plan.      Track  and Manage My Blood Pressure-Hypertension [facility]) (pt-stated)   Not on track     Timeframe:  Long-Range Goal Priority:  Medium Start Date:                            08/07/21 Expected End Date:                     10/30/21  Follow Up Date 09/07/21  Barriers: Health Behaviors Knowledge  -obtain a blood pressure cuff  - check blood pressure weekly     Notes:  08/07/21 With teach back method he was able to verbalize the importance of scheduling his cancelled pcp appointment for September 2022 and obtaining a BP cuff Voiced being aware of [employer] benefit to obtain DME        Robert Chan L. Lavina Hamman, RN, BSN, [place] Coordinator Office number [phone] Main [facility] number [phone] Fax number [phone]

## 2021-08-19 ENCOUNTER — Ambulatory Visit (INDEPENDENT_AMBULATORY_CARE_PROVIDER_SITE_OTHER): Payer: Medicare HMO | Admitting: Podiatry

## 2021-08-19 ENCOUNTER — Other Ambulatory Visit: Payer: Self-pay

## 2021-08-19 DIAGNOSIS — M79674 Pain in right toe(s): Secondary | ICD-10-CM | POA: Diagnosis not present

## 2021-08-19 DIAGNOSIS — L989 Disorder of the skin and subcutaneous tissue, unspecified: Secondary | ICD-10-CM | POA: Diagnosis not present

## 2021-08-19 DIAGNOSIS — B351 Tinea unguium: Secondary | ICD-10-CM | POA: Diagnosis not present

## 2021-08-19 DIAGNOSIS — M79675 Pain in left toe(s): Secondary | ICD-10-CM | POA: Diagnosis not present

## 2021-08-25 ENCOUNTER — Other Ambulatory Visit: Payer: Self-pay | Admitting: Pharmacist

## 2021-08-25 MED ORDER — BUSPIRONE HCL 10 MG PO TABS: 10.0000 mg | ORAL_TABLET | Freq: Three times a day (TID) | ORAL | 2 refills | Status: DC

## 2021-08-26 NOTE — Progress Notes (Signed)
    Subjective: Patient is a 65 y.o. male presenting to the office today with a chief complaint of painful callus lesion(s) noted to the left foot has been present for several months Patient also complains of elongated, thickened nails that cause pain while ambulating in shoes.  He is unable to trim his own nails. Patient presents today for further treatment and evaluation.  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 Pain on palpation with a central nucleated core noted. Skin is warm, dry and supple bilateral lower extremities. Negative for open lesions or macerations. Nails are tender, long, thickened and dystrophic with subungual debris, consistent with onychomycosis, 1-5 bilateral. No signs of infection noted.  Vascular: Palpable pedal pulses bilaterally. No edema or erythema noted. Capillary refill within normal limits.  Neurological: Epicritic and protective threshold grossly intact bilaterally.   Musculoskeletal Exam: Pain on palpation at the keratotic lesion(s) noted. Range of motion within normal limits bilateral. Muscle strength 5/5 in all groups bilateral.  Assessment: 1. Onychodystrophic nails 1-5 bilateral with hyperkeratosis of nails.  2. Onychomycosis of nail due to dermatophyte bilateral 3.  Preulcerative callus lesion to the subfifth MTPJ left foot   Plan of Care:  1. Patient  evaluated. 2. Excisional debridement of keratoic lesion(s) using a chisel blade was performed without incident.  3. Dressed with light dressing. 4. Mechanical debridement of nails 1-5 bilaterally performed using a nail nipper. Filed with dremel without incident.  5. Patient is to return to the clinic in 3 months.   Edrick Kins, DPM [facility]  Dr. Edrick Kins, DPM    [address], [place] [address]                Office [phone]  Fax [phone]

## 2021-09-07 ENCOUNTER — Other Ambulatory Visit: Payer: Self-pay | Admitting: *Deleted

## 2021-09-07 NOTE — Patient Outreach (Addendum)
[place] [facility]) Care Management  09/07/2021  Robert Chan 11-13-1955 [id number]   [facility] follow up outreach for MD referred patient   Robert Robert Chan was referred to [facility] by his MD Robert Chan) on 05/27/21   Insurance  [employer]na medicare and medicaid   He is able to provided HIPAA identifiers  Follow up assessment Robert Chan reports he is doing well today Today he has his friend Ms Robert Chan assisting him    Chronic obstructive pulmonary disease (COPD)  He is noted without audible congestion nor wheezing   Hypertension (HTN) Continues to walk for exercise  Report he last checked his blood pressure in awhile but it was good when he last checked it   Life insurance Still working on it on fixed income  Plan Patient agrees to care plan and follow up within the next 30 business days Care Plan : COPD (Adult)  Updates made by Barbaraann Faster, RN since 09/07/2021 12:00 AM     Problem: Disease Progression (COPD) Resolved 09/07/2021  Priority: High  Onset Date: 05/27/2021     Long-Range Goal: Disease Progression Minimized or Managed Completed 09/07/2021  Start Date: 05/27/2021  Expected End Date: 08/31/2021  This Visit's Progress: On track  Recent Progress: On track  Priority: High  Note:   Notes:     Task: Alleviate Barriers to COPD Management Completed 09/07/2021  Due Date: 08/31/2021  Outcome: Positive  Responsible User: Barbaraann Faster, RN  Note:   Care Management Activities:  09/07/21 doing well COPD goal met continues without congestion/wheezing  08/07/21 doing well He is noted without audible congestion nor wheezing  He was commended for his good home management No ED visits nor admission since referred  07/02/21 updated goal date to 07/31/21 still pending [employer] nebulizer delivery Conference with him to [place] pending a call from [employer]  06/16/21 ASSESSED [facility] [place] EMMIs - activity or exercise based  on tolerance encouraged - barriers to treatment managed - breathing techniques encouraged - communicable disease prevention promoted - medication-adherence assessment completed - quality of sleep assessed - rescue (action) plan reviewed - screen for functional limitations completed and reviewed - smoking counseling provided - self-awareness of symptom triggers encouraged    Notes:     Care Plan : Hypertension (Adult)  Updates made by Barbaraann Faster, RN since 09/07/2021 12:00 AM     Problem: Disease Progression (Hypertension) Resolved 09/07/2021  Priority: Medium  Onset Date: 08/07/2021     Long-Range Goal: Disease Progression Prevented or Minimized Completed 09/07/2021  Start Date: 08/07/2021  Expected End Date: 10/30/2021  This Visit's Progress: Not on track  Recent Progress: Not on track  Priority: Medium  Note:    Notes:     Task: Alleviate Barriers to Hypertension Treatment Completed 09/07/2021  Due Date: 10/30/2021  Outcome: Not Done  Responsible User: Barbaraann Faster, RN  Note:   Care Management Activities:  09/07/21 Resolving due to duplicate goal not checking BP as agreed Friend Ms Robert Chan available to assist patient in future  08/07/21 encouraged to obtain one with his Aetna benefit at a local drug store. The importance discussed again today along with risks of hypotension and hypertension  With teach back method he was able to verbalize the importance of scheduling his cancelled pcp appointment for September 2022 and obtaining a BP cuff - healthy diet promoted - healthy family lifestyle promoted - medication side effects managed - patient response to treatment assessed - quality  of sleep assessed - reduction of dietary sodium encouraged - reduction in sedentary activities encouraged - response to pharmacologic therapy monitored - sleep hygiene techniques encouraged    Notes:     Care Plan : RN CM plan of care  Updates made by Barbaraann Faster, RN  since 09/07/2021 12:00 AM     Problem: Complex Care Coordination Needs and disease management in patient with HTN   Priority: High     Long-Range Goal: Establish Plan of Care for Management Complex SDOH Barriers, disease management and Care Coordination Needs in patient with HTN   Start Date: 09/07/2021  This Visit's Progress: Not on track  Priority: High  Note:   Current Barriers:  Knowledge Deficits related to plan of care for management of HTN Care Coordination needs related to Limited education about HTN* and Lacks knowledge of community resource: life Emergency planning/management officer CM Clinical Goal(s):  Patient will verbalize understanding of plan for management of HTN through collaboration with Consulting civil engineer, provider, and care team.   Interventions: Review of EPIC chart, appointments Inter-disciplinary care team collaboration (see longitudinal plan of care) Evaluation of current treatment plan related to  self management and patient's adherence to plan as established by provider  Hypertension Interventions: Last practice recorded BP readings:  BP Readings from Last 3 Encounters:  07/12/21 (!) 147/88  06/15/21 116/83  04/23/21 133/85  Most recent eGFR/CrCl:  Lab Results  Component Value Date   EGFR 41 (L) 12/25/2020    No components found for: CRCL  Evaluation of current treatment plan related to hypertension self management and patient's adherence to plan as established by provider; Reviewed medications with patient and discussed importance of compliance; Provided assistance with obtaining home blood pressure monitor via his [employer] OTC benefit catalog;  Counseled on the importance of exercise goals with target of 150 minutes per week Discussed plans with patient for ongoing care management follow up and provided patient with direct contact information for care management team; Reviewed scheduled/upcoming provider appointments including:  Assessed social determinant of health barriers;    Patient Goals/Self-Care Activities: Patient will self administer medications as prescribed Patient will attend all scheduled provider appointments Patient will continue to perform ADL's independently Patient will call provider office for new concerns or questions Obtain a BP cuff to monitor at home as ordered by MD He will obtain a note book to write down each of his providers with assistance of Ms Robert Chan  Follow Up Plan:  Telephone follow up appointment with care management team member scheduled for:  within the next 10 business days as agreed The patient has been provided with contact information for the care management team and has been advised to call with any health related questions or concerns.       Tahtiana Rozier L. Lavina Hamman, RN, BSN, [place] Coordinator Office number [phone] Main [facility] number [phone] Fax number [phone]

## 2021-09-08 ENCOUNTER — Other Ambulatory Visit: Payer: Self-pay

## 2021-09-28 ENCOUNTER — Encounter: Payer: Self-pay | Admitting: Critical Care Medicine

## 2021-09-28 ENCOUNTER — Ambulatory Visit: Payer: Medicare HMO | Attending: Critical Care Medicine | Admitting: Critical Care Medicine

## 2021-09-28 ENCOUNTER — Other Ambulatory Visit: Payer: Self-pay

## 2021-09-28 ENCOUNTER — Telehealth: Payer: Self-pay | Admitting: Critical Care Medicine

## 2021-09-28 VITALS — BP 139/89 | HR 80 | Resp 16 | Ht 76.0 in | Wt 306.2 lb

## 2021-09-28 DIAGNOSIS — J449 Chronic obstructive pulmonary disease, unspecified: Secondary | ICD-10-CM | POA: Diagnosis not present

## 2021-09-28 DIAGNOSIS — Z23 Encounter for immunization: Secondary | ICD-10-CM | POA: Diagnosis not present

## 2021-09-28 DIAGNOSIS — I2510 Atherosclerotic heart disease of native coronary artery without angina pectoris: Secondary | ICD-10-CM

## 2021-09-28 DIAGNOSIS — L209 Atopic dermatitis, unspecified: Secondary | ICD-10-CM | POA: Diagnosis not present

## 2021-09-28 DIAGNOSIS — I252 Old myocardial infarction: Secondary | ICD-10-CM

## 2021-09-28 DIAGNOSIS — R7989 Other specified abnormal findings of blood chemistry: Secondary | ICD-10-CM

## 2021-09-28 DIAGNOSIS — Z87891 Personal history of nicotine dependence: Secondary | ICD-10-CM

## 2021-09-28 DIAGNOSIS — I10 Essential (primary) hypertension: Secondary | ICD-10-CM | POA: Diagnosis not present

## 2021-09-28 DIAGNOSIS — F332 Major depressive disorder, recurrent severe without psychotic features: Secondary | ICD-10-CM

## 2021-09-28 DIAGNOSIS — J410 Simple chronic bronchitis: Secondary | ICD-10-CM | POA: Diagnosis not present

## 2021-09-28 DIAGNOSIS — Z5900 Homelessness unspecified: Secondary | ICD-10-CM

## 2021-09-28 DIAGNOSIS — E559 Vitamin D deficiency, unspecified: Secondary | ICD-10-CM

## 2021-09-28 DIAGNOSIS — I2584 Coronary atherosclerosis due to calcified coronary lesion: Secondary | ICD-10-CM

## 2021-09-28 DIAGNOSIS — Z1211 Encounter for screening for malignant neoplasm of colon: Secondary | ICD-10-CM

## 2021-09-28 MED ORDER — SYMBICORT 160-4.5 MCG/ACT IN AERO: 2.0000 | INHALATION_SPRAY | Freq: Two times a day (BID) | RESPIRATORY_TRACT | 11 refills | Status: DC

## 2021-09-28 MED ORDER — VALSARTAN 160 MG PO TABS
160.0000 mg | ORAL_TABLET | Freq: Every day | ORAL | 3 refills | Status: DC
Start: 2021-09-28 — End: 2022-01-11

## 2021-09-28 MED ORDER — ARIPIPRAZOLE 10 MG PO TABS: 10.0000 mg | ORAL_TABLET | Freq: Every day | ORAL | 3 refills | Status: DC

## 2021-09-28 MED ORDER — VALSARTAN-HYDROCHLOROTHIAZIDE 320-25 MG PO TABS: 1.0000 | ORAL_TABLET | Freq: Every day | ORAL | 3 refills | Status: DC

## 2021-09-28 MED ORDER — BUSPIRONE HCL 10 MG PO TABS: 10.0000 mg | ORAL_TABLET | Freq: Three times a day (TID) | ORAL | 2 refills | Status: DC

## 2021-09-28 MED ORDER — BUPROPION HCL ER (XL) 150 MG PO TB24: 150.0000 mg | ORAL_TABLET | Freq: Every morning | ORAL | 3 refills | Status: DC

## 2021-09-28 MED ORDER — ATORVASTATIN CALCIUM 40 MG PO TABS: 40.0000 mg | ORAL_TABLET | Freq: Every day | ORAL | 1 refills | Status: DC

## 2021-09-28 MED ORDER — DILTIAZEM HCL ER BEADS 240 MG PO CP24: 240.0000 mg | ORAL_CAPSULE | Freq: Every day | ORAL | 2 refills | Status: DC

## 2021-09-28 MED ORDER — VITAMIN D (ERGOCALCIFEROL) 1.25 MG (50000 UNIT) PO CAPS: 50000.0000 [IU] | ORAL_CAPSULE | ORAL | 1 refills | Status: DC

## 2021-09-28 NOTE — Progress Notes (Signed)
Established Patient Office Visit  Subjective:  Patient ID: Robert Chan, male    DOB: September 02, 1956  Age: 65 y.o. MRN: [id number]  CC:  Chief Complaint  Patient presents with   Annual Exam    HPI [name] presents for annual exam.  Note the patient did not process his Cologuard kit will need another 1.  He did go to the emergency room in September for a rash on the hands and feet he has since been seen by dermatology who prescribed topical cyclosporine cream and a steroid cream with improvement in the rash.  Patient also received a prednisone pulse in the emergency room which helped as well.  Patient does need a flu vaccine he agreed to receive it he did declined the pneumonia shot at this time The patient does have quite a bit of anxiety and scored fairly high on GAD-7 and also on PHQ-9.  He is under treatment with Abilify and Wellbutrin however he would benefit from licensed clinical social work counseling.  The patient is not suicidal at this time.  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   Depression    Hypertension    NSTEMI (non-ST elevated myocardial infarction) ([place])    Status post incision and drainage 04/22/2021   Suicidal ideation 06/05/2020    Past Surgical History:  Procedure Laterality Date   APPENDECTOMY     EYE SURGERY     LEFT HEART CATH AND CORONARY ANGIOGRAPHY N/A 06/05/2020   Procedure: LEFT HEART CATH AND CORONARY ANGIOGRAPHY;  Surgeon: Nigel Mormon, MD;  Location: [facility];  Service: Cardiovascular;  Laterality: N/A;    PROSTATE SURGERY      Family History  Problem Relation Age of Onset   Hypertension Mother    Diabetes Mother    Hypertension Father    Hypertension Sister     Social History   Socioeconomic History   Marital status: Single    Spouse name: Not on file   Number of children: Not on file   Years of education: Not on file   Highest education level: Not on file  Occupational History   Not on file  Tobacco Use   Smoking status: Former    Types: Cigarettes    Quit date: 12/25/2020    Years since quitting: 0.7   Smokeless tobacco: Never   Tobacco comments:    smokes 3-4 cigarettes/day  Vaping Use   Vaping Use: Never used  Substance and Sexual Activity   Alcohol use: Yes    Comment: drinks 2x/wk, 2-3 40 oz drinks, last drink Friday   Drug use: Yes    Types: Cocaine    Comment: cocaine use once a month, last use Friday    Sexual activity: Not Currently  Other Topics Concern   Not on file  Social History Narrative   Had 9 sisters, one deceased    Had 3 brothers, all deceased   Family still live in [place] a Daughter and 78  grand daughters local   Social Determinants of Health   Financial Resource Strain: Low Risk    Difficulty of Paying Living Expenses: Not hard at all  Food Insecurity: No Food Insecurity   Worried About Charity fundraiser in the Last Year: Never true   Arboriculturist in the Last Year: Never true  Transportation Needs: No Transportation Needs   Lack of Transportation (Medical): No   Lack of Transportation (Non-Medical): No  Physical Activity: Not on file  Stress: No Stress Concern Present   Feeling of Stress : Only a little  Social Connections: Moderately Integrated   Frequency of Communication with Friends and Family: Three times a week   Frequency of Social Gatherings with Friends and Family: Three times a week   Attends Religious Services: 1 to 4 times per year   Active Member of Clubs or Organizations: Yes   Attends Theatre manager Meetings: 1 to 4 times per year   Marital Status: Never married  Intimate Partner Violence: Unknown   Fear of Current or Ex-Partner: No   Emotionally Abused: No   Physically Abused: No   Sexually Abused: Not on file    Outpatient Medications Prior to Visit  Medication Sig Dispense Refill   albuterol (PROVENTIL) (2.5 MG/3ML) 0.083% nebulizer solution Take 3 mLs (2.5 mg total) by nebulization every 6 (six) hours as needed for wheezing or shortness of breath. 150 mL 1   albuterol (VENTOLIN HFA) 108 (90 Base) MCG/ACT inhaler Inhale 1-2 puffs into the lungs every 6 (six) hours as needed for wheezing or shortness of breath. 90 g 2   latanoprost (XALATAN) 0.005 % ophthalmic solution Place 1 drop into both eyes nightly. 2.5 mL 3   ARIPiprazole (ABILIFY) 10 MG tablet Take 1 tablet (10 mg total) by mouth at bedtime. 30 tablet 3   atorvastatin (LIPITOR) 40 MG tablet Take 1 tablet (40 mg total) by mouth daily. 90 tablet 1   buPROPion (WELLBUTRIN XL) 150 MG 24 hr tablet Take 1 tablet (150 mg total) by mouth every morning. 60 tablet 3   busPIRone (BUSPAR) 10 MG tablet Take 1 tablet (10 mg total) by mouth 3 (three) times daily. Prn for anxiety 90 tablet 2   diltiazem (TIAZAC) 240 MG 24 hr capsule Take 1 capsule (240 mg total) by mouth daily. 90 capsule 2   doxycycline (VIBRAMYCIN) 100 MG capsule Take 100 mg by mouth 2 (two) times daily.     furosemide (LASIX) 20 MG tablet Take 20 mg by mouth daily.     mupirocin ointment (BACTROBAN) 2 % SMARTSIG:1 Application Topical 2-3 Times Daily     NAFTIN 2 % GEL Apply topically daily.     predniSONE (STERAPRED UNI-PAK 21 TAB) 10 MG (21) TBPK tablet Take by mouth daily. Take 6 tabs by mouth daily  for 2 days, then 5 tabs for 2 days, then 4 tabs for 2 days, then 3 tabs for 2 days, 2 tabs for 2 days, then 1 tab by mouth daily for 2 days 42 tablet 0   sulfamethoxazole-trimethoprim (BACTRIM DS) 800-160 MG tablet Take 1 tablet by mouth 2 (two) times daily.      SYMBICORT 160-4.5 MCG/ACT inhaler Inhale 2 puffs into the lungs in the morning and at bedtime. 10.2 g 11   valsartan-hydrochlorothiazide (DIOVAN-HCT) 160-25 MG tablet Take 1 tablet by mouth daily. 60 tablet 2   Vitamin D, Ergocalciferol, (DRISDOL) 1.25 MG (50000 UNIT) CAPS capsule Take 1 capsule (50,000 Units total)  by mouth once a week. 12 capsule 1   No facility-administered medications prior to visit.    Allergies  Allergen Reactions   Shellfish Allergy Hives and Rash    ROS Review of Systems  Constitutional:  Negative for chills, diaphoresis and fever.  HENT:  Negative for congestion, ear discharge, ear pain, hearing loss, nosebleeds, sore throat and tinnitus.   Eyes:  Negative for photophobia and discharge.  Respiratory:  Negative for cough, chest tightness, shortness of breath, wheezing and stridor.        No excess mucus  Cardiovascular:  Negative for chest pain, palpitations and leg swelling.  Gastrointestinal:  Negative for abdominal pain, blood in stool, constipation, diarrhea, nausea and vomiting.  Endocrine: Negative for polydipsia.  Genitourinary:  Negative for dysuria, flank pain, frequency, hematuria and urgency.  Musculoskeletal:  Negative for back pain, myalgias and neck pain.  Skin:  Positive for rash.  Allergic/Immunologic: Negative for environmental allergies.  Neurological:  Positive for numbness. Negative for dizziness, tremors, seizures, weakness and headaches.       Right hand numb  Hematological:  Does not bruise/bleed easily.  Psychiatric/Behavioral:  Negative for hallucinations and suicidal ideas. The patient is not nervous/anxious.   All other systems reviewed and are negative.    Objective:    Physical Exam Vitals reviewed.  Constitutional:      Appearance: Normal appearance. He is well-developed. He is obese. He is not diaphoretic.  HENT:     Head: Normocephalic and atraumatic.     Nose: Nose normal. No nasal deformity, septal deviation, mucosal  edema or rhinorrhea.     Right Sinus: No maxillary sinus tenderness or frontal sinus tenderness.     Left Sinus: No maxillary sinus tenderness or frontal sinus tenderness.     Mouth/Throat:     Mouth: Mucous membranes are moist.     Pharynx: Oropharynx is clear. No oropharyngeal exudate.  Eyes:     General: No scleral icterus.    Conjunctiva/sclera: Conjunctivae normal.     Pupils: Pupils are equal, round, and reactive to light.  Neck:     Thyroid: No thyromegaly.     Vascular: No carotid bruit or JVD.     Trachea: Trachea normal. No tracheal tenderness or tracheal deviation.  Cardiovascular:     Rate and Rhythm: Normal rate and regular rhythm.     Chest Wall: PMI is not displaced.     Pulses: Normal pulses. No decreased pulses.     Heart sounds: Normal heart sounds, S1 normal and S2 normal. Heart sounds not distant. No murmur heard. No systolic murmur is present.  No diastolic murmur is present.    No friction rub. No gallop. No S3 or S4 sounds.  Pulmonary:     Effort: No tachypnea, accessory muscle usage or respiratory distress.     Breath sounds: No stridor. No decreased breath sounds, wheezing, rhonchi or rales.  Chest:     Chest wall: No tenderness.  Abdominal:     General: Bowel sounds are normal. There is no distension.     Palpations: Abdomen is soft. Abdomen is not rigid.     Tenderness: There is no abdominal tenderness. There is no guarding or rebound.  Genitourinary:    Penis: Normal.      Testes: Normal.     Prostate: Normal.     Rectum: Normal. Guaiac result negative.  Musculoskeletal:        General: Normal range of motion.     Cervical back: Normal  range of motion and neck supple. No edema, erythema or rigidity. No muscular tenderness. Normal range of motion.  Lymphadenopathy:     Head:     Right side of head: No submental or submandibular adenopathy.     Left side of head: No submental or submandibular adenopathy.     Cervical: No cervical adenopathy.   Skin:    General: Skin is warm and dry.     Coloration: Skin is not pale.     Findings: No rash.     Nails: There is no clubbing.  Neurological:     General: No focal deficit present.     Mental Status: He is alert and oriented to person, place, and time. Mental status is at baseline.     Cranial Nerves: No cranial nerve deficit.     Sensory: No sensory deficit.     Motor: No weakness.  Psychiatric:        Attention and Perception: Attention and perception normal.        Mood and Affect: Mood normal.        Speech: Speech normal.        Behavior: Behavior normal.        Thought Content: Thought content normal. Thought content is not paranoid or delusional. Thought content does not include homicidal or suicidal ideation. Thought content does not include homicidal or suicidal plan.        Cognition and Memory: Cognition and memory normal.        Judgment: Judgment normal.    BP 139/89 (BP Location: Right Arm)   Pulse 80   Resp 16   Ht '6\' 4"'  (1.93 m)   Wt (!) 306 lb 3.2 oz (138.9 kg)   SpO2 95%   BMI 37.27 kg/m  Wt Readings from Last 3 Encounters:  09/28/21 (!) 306 lb 3.2 oz (138.9 kg)  06/15/21 289 lb 3.2 oz (131.2 kg)  04/15/21 280 lb (127 kg)     Health Maintenance Due  Topic Date Due   Fecal DNA (Cologuard)  Never done   Zoster Vaccines- Shingrix (1 of 2) Never done   INFLUENZA VACCINE  Never done    There are no preventive care reminders to display for this patient.  Lab Results  Component Value Date   TSH 2.640 09/09/2020   Lab Results  Component Value Date   WBC 10.8 (H) 04/15/2021   HGB 12.2 (L) 04/15/2021   HCT 37.9 (L) 04/15/2021   MCV 93.6 04/15/2021   PLT 262 04/15/2021   Lab Results  Component Value Date   NA 140 04/15/2021   K 3.5 04/15/2021   CO2 24 04/15/2021   GLUCOSE 112 (H) 04/15/2021   BUN 15 04/15/2021   CREATININE 1.56 (H) 04/15/2021   BILITOT 0.9 06/15/2021   ALKPHOS 65 06/15/2021   AST 16 06/15/2021   ALT 23 06/15/2021   PROT  7.0 06/15/2021   ALBUMIN 4.1 06/15/2021   CALCIUM 9.0 04/15/2021   ANIONGAP 8 04/15/2021   EGFR 41 (L) 12/25/2020   Lab Results  Component Value Date   CHOL 162 06/15/2021   Lab Results  Component Value Date   HDL 50 06/15/2021   Lab Results  Component Value Date   LDLCALC 96 06/15/2021   Lab Results  Component Value Date   TRIG 84 06/15/2021   Lab Results  Component Value Date   CHOLHDL 3.2 06/15/2021   Lab Results  Component Value Date   HGBA1C 4.8 09/07/2020  Assessment & Plan:   Problem List Items Addressed This Visit       Cardiovascular and Mediastinum   Benign hypertension - Primary    Hypertension currently at goal at this time  No change in medications refills given      Relevant Medications   atorvastatin (LIPITOR) 40 MG tablet   diltiazem (TIAZAC) 240 MG 24 hr capsule   valsartan (DIOVAN) 160 MG tablet   Other Relevant Orders   Comprehensive metabolic panel   CBC with Differential/Platelet   Coronary artery disease   Relevant Medications   atorvastatin (LIPITOR) 40 MG tablet   diltiazem (TIAZAC) 240 MG 24 hr capsule   valsartan (DIOVAN) 160 MG tablet   Other Relevant Orders   Lipid panel     Respiratory   COPD with chronic bronchitis ([place])    Patient no longer smoking Continue Advair as prescribed albuterol as needed      Relevant Medications   SYMBICORT 160-4.5 MCG/ACT inhaler     Musculoskeletal and Integument   Atopic dermatitis    Stable at this time  Patient has follow-up with dermatology        Other   Elevated serum creatinine    Repeat metabolic profile      History of non-ST elevation myocardial infarction (NSTEMI)    Repeat lipid panel      Homelessness    Not currently homeless but still housing potentially unstable      Vitamin D deficiency    Refill vitamin D supplement      MDD (major depressive disorder), recurrent episode, severe ([place])    Continue current medication refer to licensed clinical  social work      Relevant Medications   buPROPion (WELLBUTRIN XL) 150 MG 24 hr tablet   busPIRone (BUSPAR) 10 MG tablet   Former tobacco use    Not currently smoking      Other Visit Diagnoses     Need for immunization against influenza       Relevant Orders   Flu Vaccine QUAD High Dose(Fluad)   Simple chronic bronchitis (HCC)       Relevant Medications   SYMBICORT 160-4.5 MCG/ACT inhaler   Colon cancer screening       Relevant Orders   Cologuard       Meds ordered this encounter  Medications   ARIPiprazole (ABILIFY) 10 MG tablet    Sig: Take 1 tablet (10 mg total) by mouth at bedtime.    Dispense:  30 tablet    Refill:  3   atorvastatin (LIPITOR) 40 MG tablet    Sig: Take 1 tablet (40 mg total) by mouth daily.    Dispense:  90 tablet    Refill:  1    Disregard 33m lipitor Rx already sent   buPROPion (WELLBUTRIN XL) 150 MG 24 hr tablet    Sig: Take 1 tablet (150 mg total) by mouth every morning.    Dispense:  60 tablet    Refill:  3   busPIRone (BUSPAR) 10 MG tablet    Sig: Take 1 tablet (10 mg total) by mouth 3 (three) times daily. Prn for anxiety    Dispense:  90 tablet    Refill:  2   diltiazem (TIAZAC) 240 MG 24 hr capsule    Sig: Take 1 capsule (240 mg total) by mouth daily.    Dispense:  90 capsule    Refill:  2   SYMBICORT 160-4.5 MCG/ACT inhaler    Sig: Inhale  2 puffs into the lungs in the morning and at bedtime.    Dispense:  10.2 g    Refill:  11   Vitamin D, Ergocalciferol, (DRISDOL) 1.25 MG (50000 UNIT) CAPS capsule    Sig: Take 1 capsule (50,000 Units total) by mouth once a week.    Dispense:  12 capsule    Refill:  1   DISCONTD: valsartan-hydrochlorothiazide (DIOVAN-HCT) 320-25 MG tablet    Sig: Take 1 tablet by mouth daily.    Dispense:  90 tablet    Refill:  3   valsartan (DIOVAN) 160 MG tablet    Sig: Take 1 tablet (160 mg total) by mouth daily.    Dispense:  90 tablet    Refill:  3    Cancel the 362m valsartan hct dose just sent   Flu vaccine given patient declined pneumonia vaccine  Follow-up: Return in about 3 months (around 12/29/2021).  35 minutes spent complex decision making is high referral to social work sent medications refilled physical exam done  PAsencion Noble MD

## 2021-09-28 NOTE — Assessment & Plan Note (Signed)
Not currently homeless but still housing potentially unstable

## 2021-09-28 NOTE — Assessment & Plan Note (Signed)
Patient no longer smoking Continue Advair as prescribed albuterol as needed

## 2021-09-28 NOTE — Patient Instructions (Addendum)
No change in medications I sent refills to your pharmacy  A Cologuard kit will be reordered and sent to your home for colon cancer screening  Complete screening labs will be obtained today  You received a flu vaccine  We will have [place] our clinical social worker call you for behavioral health counseling  You declined the pneumonia vaccine for now  Return to see Dr. Joya Gaskins in 3 months

## 2021-09-28 NOTE — Assessment & Plan Note (Addendum)
Stable at this time  Patient has follow-up with dermatology

## 2021-09-28 NOTE — Assessment & Plan Note (Signed)
Repeat lipid panel ?

## 2021-09-28 NOTE — Assessment & Plan Note (Signed)
Refill vitamin D supplement

## 2021-09-28 NOTE — Assessment & Plan Note (Signed)
Repeat metabolic profile

## 2021-09-28 NOTE — Telephone Encounter (Signed)
Hx of homelessness depression anxiety, has housing now, on meds would benefit from counseling

## 2021-09-28 NOTE — Assessment & Plan Note (Signed)
Hypertension currently at goal at this time  No change in medications refills given

## 2021-09-28 NOTE — Assessment & Plan Note (Signed)
Not currently smoking

## 2021-09-28 NOTE — Assessment & Plan Note (Signed)
Continue current medication refer to licensed clinical social work

## 2021-09-29 LAB — CBC WITH DIFFERENTIAL/PLATELET
Basophils Absolute: 0.1 10*3/uL (ref 0.0–0.2)
Basos: 1 %
EOS (ABSOLUTE): 0.3 10*3/uL (ref 0.0–0.4)
Eos: 3 %
Hematocrit: 36.4 % — ABNORMAL LOW (ref 37.5–51.0)
Hemoglobin: 12.3 g/dL — ABNORMAL LOW (ref 13.0–17.7)
Immature Grans (Abs): 0 10*3/uL (ref 0.0–0.1)
Immature Granulocytes: 0 %
Lymphocytes Absolute: 3 10*3/uL (ref 0.7–3.1)
Lymphs: 31 %
MCH: 30.1 pg (ref 26.6–33.0)
MCHC: 33.8 g/dL (ref 31.5–35.7)
MCV: 89 fL (ref 79–97)
Monocytes Absolute: 0.9 10*3/uL (ref 0.1–0.9)
Monocytes: 9 %
Neutrophils Absolute: 5.5 10*3/uL (ref 1.4–7.0)
Neutrophils: 56 %
Platelets: 281 10*3/uL (ref 150–450)
RBC: 4.08 x10E6/uL — ABNORMAL LOW (ref 4.14–5.80)
RDW: 12 % (ref 11.6–15.4)
WBC: 9.7 10*3/uL (ref 3.4–10.8)

## 2021-09-29 LAB — COMPREHENSIVE METABOLIC PANEL
ALT: 18 IU/L (ref 0–44)
AST: 25 IU/L (ref 0–40)
Albumin/Globulin Ratio: 1.4 (ref 1.2–2.2)
Albumin: 4.4 g/dL (ref 3.8–4.8)
Alkaline Phosphatase: 78 IU/L (ref 44–121)
BUN/Creatinine Ratio: 11 (ref 10–24)
BUN: 13 mg/dL (ref 8–27)
Bilirubin Total: 0.6 mg/dL (ref 0.0–1.2)
CO2: 19 mmol/L — ABNORMAL LOW (ref 20–29)
Calcium: 8.9 mg/dL (ref 8.6–10.2)
Chloride: 104 mmol/L (ref 96–106)
Creatinine, Ser: 1.19 mg/dL (ref 0.76–1.27)
Globulin, Total: 3.1 g/dL (ref 1.5–4.5)
Glucose: 98 mg/dL (ref 70–99)
Potassium: 4.4 mmol/L (ref 3.5–5.2)
Sodium: 143 mmol/L (ref 134–144)
Total Protein: 7.5 g/dL (ref 6.0–8.5)
eGFR: 67 mL/min/{1.73_m2} (ref >59)

## 2021-09-29 LAB — LIPID PANEL
Chol/HDL Ratio: 2.7 ratio (ref 0.0–5.0)
Cholesterol, Total: 155 mg/dL (ref 100–199)
HDL: 57 mg/dL (ref >39)
LDL Chol Calc (NIH): 80 mg/dL (ref 0–99)
Triglycerides: 101 mg/dL (ref 0–149)
VLDL Cholesterol Cal: 18 mg/dL (ref 5–40)

## 2021-10-02 ENCOUNTER — Telehealth: Payer: Self-pay | Admitting: Critical Care Medicine

## 2021-10-02 NOTE — Telephone Encounter (Signed)
Copied from [place] [phone]. Topic: General - Other >> Oct 01, 2021  2:37 PM Valere Dross wrote: Reason for CRM: Pt called in stating he was informed that someone would give him a call back to give him his lab results, and requesting if someone call him, please advise.

## 2021-10-02 NOTE — Telephone Encounter (Signed)
Called patient and left vm,  letting him know all labs from 11/28 are normal.

## 2021-10-06 ENCOUNTER — Other Ambulatory Visit: Payer: Self-pay

## 2021-10-06 ENCOUNTER — Other Ambulatory Visit: Payer: Self-pay | Admitting: *Deleted

## 2021-10-06 NOTE — Patient Outreach (Signed)
[place] [facility]) Care Management Telephonic RN Care Manager Note   10/06/2021 Name:  Robert Chan MRN:  [id number] DOB:  1956/01/19  Summary: Follow up outreach Someone to send him a gift card He did get a BP cuff and is checking as often as he remembers  Foot wounds are not healing  He voices interest in a new dermatologist [facility] in January 2023  Recommendations/Changes made from today's visit: Assessed for worsening symptoms Assessed for received DME Outreach to [employer] customer service [phone] to inquire about a [employer] gift card from 2 months ago after completing a survey - extensive wait time and patient preference to conclude the call   Discussed with patient that [facility] is unaware of this promotion    Subjective: Robert Chan is an 65 y.o. year old male who is a primary patient of Elsie Stain, MD. The care management team was consulted for assistance with care management and/or care coordination needs.      Telephonic RN Care Manager completed Telephone Visit today.   Objective:  Medications Reviewed Today     Reviewed by Elsie Stain, MD (Physician) on 09/29/21 at [place] List Status: <None>   Medication Order Taking? Sig Documenting Provider Last Dose Status Informant  albuterol (PROVENTIL) (2.5 MG/3ML) 0.083% nebulizer solution [id number] Yes Take 3 mLs (2.5 mg total) by nebulization every 6 (six) hours as needed for wheezing or shortness of breath. Elsie Stain, MD Taking Active   albuterol (VENTOLIN HFA) 108 [phone] Base) MCG/ACT inhaler [id number] Yes Inhale 1-2 puffs into the lungs every 6 (six) hours as needed for wheezing or shortness of breath. Elsie Stain, MD Taking Active     Discontinued 01/05/21 1426 (Discontinued by provider)   ARIPiprazole (ABILIFY) 10 MG tablet [id number]  Take 1 tablet (10 mg total) by mouth at bedtime. Elsie Stain, MD  Active   atorvastatin (LIPITOR) 40 MG  tablet [id number]  Take 1 tablet (40 mg total) by mouth daily. Elsie Stain, MD  Active   buPROPion (WELLBUTRIN XL) 150 MG 24 hr tablet [id number]  Take 1 tablet (150 mg total) by mouth every morning. Elsie Stain, MD  Active   busPIRone (BUSPAR) 10 MG tablet [id number]  Take 1 tablet (10 mg total) by mouth 3 (three) times daily. Prn for anxiety Elsie Stain, MD  Active   diltiazem [facility]) 240 MG 24 hr capsule [id number]  Take 1 capsule (240 mg total) by mouth daily. Elsie Stain, MD  Active   latanoprost (XALATAN) 0.005 % ophthalmic solution [id number] Yes Place 1 drop into both eyes nightly. Elsie Stain, MD Taking Active   SYMBICORT 160-4.5 MCG/ACT inhaler [id number]  Inhale 2 puffs into the lungs in the morning and at bedtime. Elsie Stain, MD  Active   valsartan (DIOVAN) 160 MG tablet [id number] Yes Take 1 tablet (160 mg total) by mouth daily. Elsie Stain, MD  Active   Vitamin D, Ergocalciferol, (DRISDOL) 1.25 MG (50000 UNIT) CAPS capsule [id number]  Take 1 capsule (50,000 Units total) by mouth once a week. Elsie Stain, MD  Active              SDOH:  (Social Determinants of Health) assessments and interventions performed:    Care Plan  Review of patient past medical history, allergies, medications, health status, including review of consultants reports, laboratory and other test data, was performed as part of comprehensive evaluation for  care management services.   There are no care plans that you recently modified to display for this patient.    Plan: The patient has been provided with contact information for the care management team and has been advised to call with any health related questions or concerns.  The care management team will reach out to the patient again over the next 30+ business days.  Heidie Krall L. Lavina Hamman, RN, BSN, [place] Coordinator Office number [phone] Main [facility] number [phone] Fax  number [phone]

## 2021-10-07 ENCOUNTER — Other Ambulatory Visit: Payer: Self-pay | Admitting: Critical Care Medicine

## 2021-10-07 ENCOUNTER — Other Ambulatory Visit: Payer: Self-pay | Admitting: *Deleted

## 2021-10-07 DIAGNOSIS — B353 Tinea pedis: Secondary | ICD-10-CM | POA: Diagnosis not present

## 2021-10-07 DIAGNOSIS — J410 Simple chronic bronchitis: Secondary | ICD-10-CM

## 2021-10-07 DIAGNOSIS — I872 Venous insufficiency (chronic) (peripheral): Secondary | ICD-10-CM | POA: Diagnosis not present

## 2021-10-07 NOTE — Patient Outreach (Signed)
[place] [facility]) Care Management  10/07/2021  Serge Main [birth year] [id number]   [facility] Care coordination  Outreach to patient's dermatology staff at [phone] Spoke with Gregary Signs to get her to update patient's provider Sharyn Lull at the [place] office that the patient does not feel that his present treatment is effective  Updated patient of this and that a list of new dermatologists was also mailed to him  Plan [facility] RN CM will follow up with patient within the next 30 business days  Moyses Pavey L. Lavina Hamman, RN, BSN, [place] Coordinator Office number [phone] Mobile number [phone]  Main [facility] number [phone] Fax number [phone]

## 2021-10-08 NOTE — Telephone Encounter (Signed)
Requested medication (s) are due for refill today:   No and several that are requested are not on the medication list.  Requested medication (s) are on the active medication list:   Symbacort and atorvastatin are on the list but the others are not.  Future visit scheduled:   No  Seen 1 wk ago 11/28 by Dr. Joya Gaskins   Last ordered: Symbicort got a duplicate request 73/22/0254 10.2g, 11 refills; [facility] aspirin 81 mg is not on the medication list; Remeron 15 mg not on the list, Folic acid 1 mg not on list.   Atorvastatin 09/28/2021 #90, 1 refill was a duplicate request too.     Returned because several of these medications are not on the medication list so per our protocol these are being returned for provider to review for refills.   Requested Prescriptions  Pending Prescriptions Disp Refills   SYMBICORT 160-4.5 MCG/ACT inhaler [Pharmacy Med Name: SYMBICORT 160-4.5 MCG INHALER] 10.2 each 12    Sig: INHALE 2 PUFFS INTO THE LUNGS IN THE MORNING AND AT BEDTIME.     Pulmonology:  Combination Products Passed - 10/07/2021  6:30 PM      Passed - Valid encounter within last 12 months    Recent Outpatient Visits           1 week ago Benign hypertension   [place], MD   3 months ago Benign hypertension   [place] Elsie Stain, MD   5 months ago Simple chronic bronchitis [facility])   [place] Elsie Stain, MD   6 months ago Coronary artery disease due to lipid rich plaque   [place] Scot Jun, FNP   8 months ago Benign hypertension   [place], MD               aspirin 81 MG EC tablet [Pharmacy Med Name: [facility] ASPIRIN EC 81 MG TABLET] 30 tablet 11    Sig: TAKE 1 TABLET (81 MG TOTAL) BY MOUTH DAILY. SWALLOW WHOLE.     Analgesics:  NSAIDS - aspirin Passed - 10/07/2021  6:30 PM       Passed - Patient is not pregnant      Passed - Valid encounter within last 12 months    Recent Outpatient Visits           1 week ago Benign hypertension   [place] Elsie Stain, MD   3 months ago Benign hypertension   [place] Elsie Stain, MD   5 months ago Simple chronic bronchitis [facility])   [place] Elsie Stain, MD   6 months ago Coronary artery disease due to lipid rich plaque   [place] Scot Jun, FNP   8 months ago Benign hypertension   [place] Elsie Stain, MD               mirtazapine (REMERON) 15 MG tablet [Pharmacy Med Name: MIRTAZAPINE 15 MG TABLET] 30 tablet 2    Sig: TAKE 1 TABLET BY MOUTH EVERYDAY AT BEDTIME     Psychiatry: Antidepressants - mirtazapine Passed - 10/07/2021  6:30 PM      Passed - AST in normal range and within 360 days  AST  Date Value Ref Range Status  09/28/2021 25 0 - 40 IU/L Final          Passed - ALT in normal range and within 360 days    ALT  Date Value Ref Range Status  09/28/2021 18 0 - 44 IU/L Final          Passed - Triglycerides in normal range and within 360 days    Triglycerides  Date Value Ref Range Status  09/28/2021 101 0 - 149 mg/dL Final          Passed - Total Cholesterol in normal range and within 360 days    Cholesterol, Total  Date Value Ref Range Status  09/28/2021 155 100 - 199 mg/dL Final          Passed - WBC in normal range and within 360 days    WBC  Date Value Ref Range Status  09/28/2021 9.7 3.4 - 10.8 x10E3/uL Final  04/15/2021 10.8 (H) 4.0 - 10.5 K/uL Final          Passed - Completed PHQ-2 or PHQ-9 in the last 360 days      Passed - Valid encounter within last 6 months    Recent Outpatient Visits           1 week ago Benign hypertension   [place], MD   3 months ago Benign hypertension   [place], Patrick E, MD   5 months ago Simple chronic bronchitis [facility])   [place] Elsie Stain, MD   6 months ago Coronary artery disease due to lipid rich plaque   [place] Scot Jun, FNP   8 months ago Benign hypertension   [place] Elsie Stain, MD               folic acid (FOLVITE) 1 MG tablet [Pharmacy Med Name: FOLIC ACID 1 MG TABLET] 30 tablet 1    Sig: TAKE 1 TABLET BY [place]     Endocrinology:  Vitamins Passed - 10/07/2021  6:30 PM      Passed - Valid encounter within last 12 months    Recent Outpatient Visits           1 week ago Benign hypertension   [place] Elsie Stain, MD   3 months ago Benign hypertension   [place] Elsie Stain, MD   5 months ago Simple chronic bronchitis [facility])   [place] Elsie Stain, MD   6 months ago Coronary artery disease due to lipid rich plaque   [place] Scot Jun, FNP   8 months ago Benign hypertension   [place] Elsie Stain, MD               atorvastatin (LIPITOR) 20 MG tablet [Pharmacy Med Name: ATORVASTATIN 20 MG TABLET] 90 tablet 3    Sig: TAKE 1 TABLET BY MOUTH EVERY DAY     Cardiovascular:  Antilipid - Statins Passed - 10/07/2021  6:30 PM      Passed - Total Cholesterol in normal range and within 360 days    Cholesterol, Total  Date Value Ref Range Status  09/28/2021 155 100 - 199 mg/dL Final  Passed - LDL in normal range and within 360 days    LDL Chol Calc (NIH)  Date Value Ref Range Status  09/28/2021 80 0 - 99 mg/dL Final          Passed - HDL in normal range and within 360 days    HDL  Date  Value Ref Range Status  09/28/2021 57 >39 mg/dL Final          Passed - Triglycerides in normal range and within 360 days    Triglycerides  Date Value Ref Range Status  09/28/2021 101 0 - 149 mg/dL Final          Passed - Patient is not pregnant      Passed - Valid encounter within last 12 months    Recent Outpatient Visits           1 week ago Benign hypertension   [place], MD   3 months ago Benign hypertension   [place] Elsie Stain, MD   5 months ago Simple chronic bronchitis [facility])   [place] Elsie Stain, MD   6 months ago Coronary artery disease due to lipid rich plaque   [place] Scot Jun, FNP   8 months ago Benign hypertension   [place] Elsie Stain, MD              No

## 2021-10-13 ENCOUNTER — Other Ambulatory Visit: Payer: Self-pay | Admitting: Critical Care Medicine

## 2021-10-13 NOTE — Telephone Encounter (Signed)
Pharmacy sent duplicate requests of:  Folic acid was dc'd 03/30/09 Furosemide was dc'd 09/28/21 [facility] 2 meds to be taken off pt's active med profile. Spoke with Ebony Hail.

## 2021-11-05 ENCOUNTER — Other Ambulatory Visit: Payer: Self-pay | Admitting: *Deleted

## 2021-11-05 ENCOUNTER — Other Ambulatory Visit: Payer: Self-pay

## 2021-11-05 NOTE — Patient Outreach (Addendum)
[place] [facility]) Care Management Telephonic RN Care Manager Note   12/04/2021 Name:  Robert Chan MRN:  [id number] DOB:  [birth year]  Summary: Follow up outreach still with inquires for a new Dermatologist but has a scheduled follow on 11/11/21   Pt had a returned letter to [facility] providing him in network dermatologists He was able today to confirm the letter was sent to the correct address RN CM updated [facility] CMA the correct address was used per patient  He was assisted with the below in network providers for dermatology, podiatry and orthopedics listed under recommendations/changes  He also inquired again about a gift card He reports he received a call "months ago" after answering health questions and was informed he would receive a gif card but he can not recall the name of the agency, person he spoke to or the number of the person who called him for RN CM to attempt to start to investigate this.  He voices medically he is managing  He joined Administrator, Civil Service today to get access to some extra resources  Recommendations/Changes made from today's visit: Answered questions about dermatologists Gave him the dermatologist specialist [place]  [place] [address] or  [address] [place] [address]  [phone] [address] [place] [phone]   [facility]  [address] [place]  [phone] provided for complaint of "aggravating back pain" lower   RN CM encouraged him to also review his [employer] benefits for OTC, transportation, dental, fitness, vision, etc   Subjective: Robert Chan is an 66 y.o. year old male who is a primary patient of Elsie Stain, MD. The care management team was consulted for assistance with care management and/or care coordination needs.    Telephonic RN Care Manager completed Telephone Visit today.    Objective:  Medications Reviewed Today     Reviewed by Lind Guest, [place] (Certified Medical Assistant) on 11/09/21 at 1545  Med List Status: <None>   Medication Order Taking? Sig Documenting Provider Last Dose Status Informant  albuterol (PROVENTIL) (2.5 MG/3ML) 0.083% nebulizer solution [id number]  Take 3 mLs (2.5 mg total) by nebulization every 6 (six) hours as needed for wheezing or shortness of breath. Elsie Stain, MD  Active   albuterol (VENTOLIN HFA) 108 (90 Base) MCG/ACT inhaler [id number]  Inhale 1-2 puffs into the lungs every 6 (six) hours as needed for wheezing or shortness of breath. Elsie Stain, MD  Active   amLODipine (NORVASC) 10 MG tablet [id number]  Take 10 mg by mouth daily. [provider]  Active   ARIPiprazole (ABILIFY) 10 MG tablet [id number]  Take 1 tablet (10 mg total) by mouth at bedtime. Elsie Stain, MD  Active   aspirin 81 MG EC tablet [id number]  TAKE 1 TABLET (81 MG TOTAL) BY MOUTH DAILY. SWALLOW WHOLE. Elsie Stain, MD  Active   atorvastatin (LIPITOR) 40 MG tablet [id number]  Take 1 tablet (40 mg total) by mouth daily. Elsie Stain, MD  Active   buPROPion (WELLBUTRIN XL) 150 MG 24 hr tablet [id number]  Take 1 tablet (150 mg total) by mouth every morning. Elsie Stain, MD  Active   busPIRone (BUSPAR) 10 MG tablet [id number]  Take 1 tablet (10 mg total) by mouth 3 (three) times daily. Prn for anxiety Elsie Stain, MD  Active   diltiazem (TIAZAC) 240 MG 24 hr capsule [id number]  Take 1 capsule (240 mg total) by mouth daily. Elsie Stain, MD  Active   furosemide (LASIX) 20 MG tablet [id number]  Take 20 mg by mouth daily. [provider]  Active   latanoprost (XALATAN) 0.005 % ophthalmic solution [id number]  Place 1 drop into both eyes nightly. Elsie Stain, MD  Active   [facility] 160-4.5 MCG/ACT inhaler [id number]  Inhale 2 puffs into the lungs in the morning and at bedtime. Elsie Stain, MD  Active    tacrolimus (PROTOPIC) 0.1 % ointment [id number]  SMARTSIG:Sparingly Topical Twice Daily [provider]  Active   valsartan (DIOVAN) 160 MG tablet [id number]  Take 1 tablet (160 mg total) by mouth daily. Elsie Stain, MD  Active   Vitamin D, Ergocalciferol, (DRISDOL) 1.25 MG (50000 UNIT) CAPS capsule [id number]  Take 1 capsule (50,000 Units total) by mouth once a week. Elsie Stain, MD  Active            Patient Active Problem List   Diagnosis Date Noted   PAD (peripheral artery disease) ([place]) 12/01/2021   Atopic dermatitis 04/22/2021   Former tobacco use    MDD (major depressive disorder), recurrent episode, severe ([place]) 09/08/2020   Vitamin D deficiency 06/23/2020   Coronary artery disease 06/19/2020   Homelessness 06/19/2020   History of non-ST elevation myocardial infarction (NSTEMI)    Foot callus 12/12/2019   Elevated serum creatinine 08/28/2019   Age-related nuclear cataract of right eye 09/13/2016   Benign hypertension 07/06/2016   Glaucoma 07/06/2016   Male erectile disorder 07/06/2016   Asthma 10/18/2012   COPD with chronic bronchitis ([place]) 10/18/2012     SDOH:  (Social Determinants of Health) assessments and interventions performed:    Care Plan  Review of patient past medical history, allergies, medications, health status, including review of consultants reports, laboratory and other test data, was performed as part of comprehensive evaluation for care management services.   Care Plan : COPD (Adult)  Updates made by Barbaraann Faster, RN since 12/04/2021 12:00 AM  Completed 12/04/2021   Care Plan : Hypertension (Adult)  Updates made by Barbaraann Faster, RN since 12/04/2021 12:00 AM  Completed 12/04/2021   Care Plan : RN CM plan of care  Updates made by Barbaraann Faster, RN since 12/04/2021 12:00 AM     Problem: Complex Care Coordination Needs and disease management in patient with HTN   Priority: High     Long-Range Goal: Establish Plan of  Care for Management Complex SDOH Barriers, disease management and Care Coordination Needs in patient with HTN   Start Date: 09/07/2021  This Visit's Progress: On track  Recent Progress: On track  Priority: High  Note:   Current Barriers:  Knowledge Deficits related to plan of care for management of HTN and atopic dermatitis  Care Coordination needs related to Limited education about HTN, atopic dermatitis* 10/07/21 he confirms he has obtained a BP monitor since the last outreach  RN CM Clinical Goal(s):  Patient will verbalize understanding of plan for management of HTN and atopic dermatitis as evidenced by improvement in BP values and healing of skin   through collaboration with RN Care manager, provider, and care team.   Interventions: Review of EPIC chart, appointments follow up outreaches for worsening symptoms, care coordination needs and disease management/education needs Inter-disciplinary care team collaboration (see longitudinal plan of care) Evaluation of current treatment plan related to  self management and patient's adherence to plan as established by provider 11/05/21 various in network dermatologists for his insurance plan provided for him to outreach to. Encouraged him to review his [employer] benefits for OTC, transportation , dental, fitness, vision, etc for 2023   Hypertension Interventions: Goal Improving Long term goal  Last practice recorded BP readings:  BP Readings from Last 3 Encounters:  09/28/21 139/89  07/12/21 (!) 147/88  06/15/21 116/83  Most recent eGFR/CrCl:  Lab Results  Component Value Date   EGFR 67 09/28/2021    No components found for: CRCL  Evaluation of current treatment plan related to hypertension self management and patient's adherence to plan as established by provider; Reviewed medications with patient and discussed importance of compliance; Provided assistance with obtaining home blood pressure monitor via his [employer] OTC benefit catalog;   Counseled on the importance of exercise goals with target of 150 minutes per week Discussed plans with patient for ongoing care management follow up and provided patient with direct contact information for care management team; Reviewed scheduled/upcoming provider appointments including:  Assessed social determinant of health barriers;  10/07/21 outreach to dermatology office to speak with staff about his ineffective present treatment -sent patient a list of other in network dermatologist locally Attempted to assist patient with an outreach to his insurance customer service line to inquire about a possible [employer] gift card her reports he is pending. After an extensive wait time the patient agree to attempt to call back himself and confirmed the correct number listed on his insurance card  Patient Goals/Self-Care Activities: Patient will self administer medications as prescribed Patient will attend all scheduled provider appointments Patient will continue to perform ADL's independently Patient will call provider office for new concerns or questions Obtain a BP cuff to monitor at home as ordered by MD He will obtain a note book to write down each of his providers with assistance of Ms Merrilyn Puma  Follow Up Plan:  Telephone follow up appointment with care management team member scheduled for:  within the next 30 business days as agreed The patient has been provided with contact information for the care management team and has been advised to call with any health related questions or concerns.        Plan: The patient has been provided with contact information for the care management team and has been advised to call with any health related questions or concerns.  The care management team will reach out to the patient again over the next 30 +business days.  Ayonna Speranza L. Lavina Hamman, RN, BSN, [place] Coordinator Office number [phone] [facility] number  [phone] Fax number [phone]

## 2021-11-09 ENCOUNTER — Other Ambulatory Visit: Payer: Self-pay

## 2021-11-09 ENCOUNTER — Ambulatory Visit (INDEPENDENT_AMBULATORY_CARE_PROVIDER_SITE_OTHER): Payer: Medicare HMO | Admitting: Podiatry

## 2021-11-09 DIAGNOSIS — B351 Tinea unguium: Secondary | ICD-10-CM | POA: Diagnosis not present

## 2021-11-09 DIAGNOSIS — L989 Disorder of the skin and subcutaneous tissue, unspecified: Secondary | ICD-10-CM | POA: Diagnosis not present

## 2021-11-09 DIAGNOSIS — M79674 Pain in right toe(s): Secondary | ICD-10-CM | POA: Diagnosis not present

## 2021-11-09 DIAGNOSIS — M79675 Pain in left toe(s): Secondary | ICD-10-CM | POA: Diagnosis not present

## 2021-11-11 ENCOUNTER — Telehealth: Payer: Self-pay | Admitting: Clinical

## 2021-11-11 DIAGNOSIS — L301 Dyshidrosis [pompholyx]: Secondary | ICD-10-CM | POA: Diagnosis not present

## 2021-11-11 DIAGNOSIS — B353 Tinea pedis: Secondary | ICD-10-CM | POA: Diagnosis not present

## 2021-11-11 DIAGNOSIS — B9689 Other specified bacterial agents as the cause of diseases classified elsewhere: Secondary | ICD-10-CM | POA: Diagnosis not present

## 2021-11-11 DIAGNOSIS — L810 Postinflammatory hyperpigmentation: Secondary | ICD-10-CM | POA: Diagnosis not present

## 2021-11-11 DIAGNOSIS — R600 Localized edema: Secondary | ICD-10-CM | POA: Diagnosis not present

## 2021-11-11 DIAGNOSIS — I872 Venous insufficiency (chronic) (peripheral): Secondary | ICD-10-CM | POA: Diagnosis not present

## 2021-11-11 NOTE — Telephone Encounter (Signed)
Spoke with pt and scheduled appt for 11/30/21.

## 2021-11-16 NOTE — Progress Notes (Signed)
° ° °  Subjective: Patient is a 66 y.o. male presenting to the office today with a chief complaint of painful callus lesion(s) noted to the left foot has been present for several months Patient also complains of elongated, thickened nails that cause pain while ambulating in shoes.  He is unable to trim his own nails. Patient presents today for further treatment and evaluation.  Past Medical History:  Diagnosis Date   Acute bilateral low back pain without sciatica 02/27/2021   Asthma    CAD (coronary artery disease)    Cataract    CKD (chronic kidney disease)    Cocaine use disorder, mild, abuse (HCC)    COPD (chronic obstructive pulmonary disease) (HCC)    Coronary vasospasm ([place]) 10/07/2020   Depression    Hypertension    NSTEMI (non-ST elevated myocardial infarction) ([place])    Status post incision and drainage 04/22/2021   Suicidal ideation 06/05/2020    Objective:  Physical Exam General: Alert and oriented x3 in no acute distress  Dermatology: Hyperkeratotic lesion(s) present on the left foot. Pain on palpation with a central nucleated core noted. Skin is warm, dry and supple bilateral lower extremities. Negative for open lesions or macerations. Nails are tender, long, thickened and dystrophic with subungual debris, consistent with onychomycosis, 1-5 bilateral. No signs of infection noted.  Vascular: Palpable pedal pulses bilaterally. No edema or erythema noted. Capillary refill within normal limits.  Neurological: Epicritic and protective threshold grossly intact bilaterally.   Musculoskeletal Exam: Pain on palpation at the keratotic lesion(s) noted. Range of motion within normal limits bilateral. Muscle strength 5/5 in all groups bilateral.  Assessment: 1. Onychodystrophic nails 1-5 bilateral with hyperkeratosis of nails.  2. Onychomycosis of nail due to dermatophyte bilateral 3.  Preulcerative callus lesion to the subfifth MTPJ left foot   Plan of Care:  1. Patient  evaluated. 2. Excisional debridement of keratoic lesion(s) using a chisel blade was performed without incident.  3. Dressed with light dressing. 4. Mechanical debridement of nails 1-5 bilaterally performed using a nail nipper. Filed with dremel without incident.  5. Patient is to return to the clinic in 3 months.   Edrick Kins, DPM [facility]  Dr. Edrick Kins, DPM    [address], [place] [address]                Office [phone]  Fax [phone]

## 2021-11-17 DIAGNOSIS — H401132 Primary open-angle glaucoma, bilateral, moderate stage: Secondary | ICD-10-CM | POA: Diagnosis not present

## 2021-11-23 ENCOUNTER — Ambulatory Visit: Payer: Medicare HMO | Admitting: Podiatry

## 2021-11-23 DIAGNOSIS — M545 Low back pain, unspecified: Secondary | ICD-10-CM | POA: Diagnosis not present

## 2021-11-23 DIAGNOSIS — E785 Hyperlipidemia, unspecified: Secondary | ICD-10-CM | POA: Diagnosis not present

## 2021-11-23 DIAGNOSIS — G8929 Other chronic pain: Secondary | ICD-10-CM | POA: Diagnosis not present

## 2021-11-23 DIAGNOSIS — H409 Unspecified glaucoma: Secondary | ICD-10-CM | POA: Diagnosis not present

## 2021-11-23 DIAGNOSIS — E669 Obesity, unspecified: Secondary | ICD-10-CM | POA: Diagnosis not present

## 2021-11-23 DIAGNOSIS — J449 Chronic obstructive pulmonary disease, unspecified: Secondary | ICD-10-CM | POA: Diagnosis not present

## 2021-11-23 DIAGNOSIS — R600 Localized edema: Secondary | ICD-10-CM | POA: Diagnosis not present

## 2021-11-23 DIAGNOSIS — Z008 Encounter for other general examination: Secondary | ICD-10-CM | POA: Diagnosis not present

## 2021-11-23 DIAGNOSIS — R69 Illness, unspecified: Secondary | ICD-10-CM | POA: Diagnosis not present

## 2021-11-23 DIAGNOSIS — I10 Essential (primary) hypertension: Secondary | ICD-10-CM | POA: Diagnosis not present

## 2021-11-24 ENCOUNTER — Telehealth: Payer: Self-pay

## 2021-11-24 NOTE — Telephone Encounter (Addendum)
Referral for [facility] faxed to [facility].   Fax request for services was received from Kalman Jewels, [place] # [phone]

## 2021-11-30 ENCOUNTER — Institutional Professional Consult (permissible substitution): Payer: Medicare HMO | Admitting: Clinical

## 2021-12-01 ENCOUNTER — Telehealth: Payer: Self-pay | Admitting: Critical Care Medicine

## 2021-12-01 DIAGNOSIS — I739 Peripheral vascular disease, unspecified: Secondary | ICD-10-CM

## 2021-12-01 NOTE — Telephone Encounter (Signed)
Called pt and scheduled appointment.

## 2021-12-01 NOTE — Telephone Encounter (Signed)
Pt needs f/u ov with me face to face.  Call him and tell him [place] did a home visit and did a peripheral artery screen and it shows he has possible artery blockage in right leg he needs further study   I know he went to vascular surgery in April 2022 and had negative evaluation

## 2021-12-02 DIAGNOSIS — L810 Postinflammatory hyperpigmentation: Secondary | ICD-10-CM | POA: Diagnosis not present

## 2021-12-02 DIAGNOSIS — L2089 Other atopic dermatitis: Secondary | ICD-10-CM | POA: Diagnosis not present

## 2021-12-02 DIAGNOSIS — R600 Localized edema: Secondary | ICD-10-CM | POA: Diagnosis not present

## 2021-12-02 DIAGNOSIS — L301 Dyshidrosis [pompholyx]: Secondary | ICD-10-CM | POA: Diagnosis not present

## 2021-12-02 DIAGNOSIS — B353 Tinea pedis: Secondary | ICD-10-CM | POA: Diagnosis not present

## 2021-12-02 DIAGNOSIS — I872 Venous insufficiency (chronic) (peripheral): Secondary | ICD-10-CM | POA: Diagnosis not present

## 2021-12-09 ENCOUNTER — Other Ambulatory Visit: Payer: Self-pay

## 2021-12-09 ENCOUNTER — Ambulatory Visit: Payer: Self-pay

## 2021-12-09 ENCOUNTER — Telehealth: Payer: Self-pay | Admitting: Critical Care Medicine

## 2021-12-09 ENCOUNTER — Other Ambulatory Visit: Payer: Self-pay | Admitting: *Deleted

## 2021-12-09 NOTE — Addendum Note (Signed)
Addended by: Erie Noe on: 12/09/2021 05:29 PM   Modules accepted: Orders

## 2021-12-09 NOTE — Telephone Encounter (Signed)
Wants to switch his pharmacy to [place] please advise  Fax: [phone] Phone: [phone] (Direct line for PCP to call)

## 2021-12-09 NOTE — Telephone Encounter (Signed)
°  Chief Complaint: medication question Symptoms: NA Frequency:  NA Pertinent Negatives: NA Disposition: [] ED /[] Urgent Care (no appt availability in office) / [] Appointment(In office/virtual)/ []  [place] [facility]/ [x] Home Care/ [] Refused Recommended Disposition /[] [place] [facility]/ []  Follow-up with PCP Additional Notes: Pt thought he had picked up diltiazem and dosage was changed but he had picked up valsartan 160mg . Advised pt he has 3 BP medications he is supposed to be taking. Will attempt to refill diltiazem and amlodipine.    Summary: med question   Patient called in about diltiazem (TIAZAC) 240 MG 24 hr capsule , wants to know if this med has been changed, because the pharmacy gave him 160mg  instead of 240.      Reason for Disposition  Caller has medicine question only, adult not sick, AND triager answers question  Answer Assessment - Initial Assessment Questions 1. NAME of MEDICATION: "What medicine are you calling about?"     Diltiazem  2. QUESTION: "What is your question?" (e.g., double dose of medicine, side effect)     Dosage changed 3. PRESCRIBING HCP: "Who prescribed it?" Reason: if prescribed by specialist, call should be referred to that group.     Dr. Joya Gaskins  Protocols used: Medication Question Call-A-AH

## 2021-12-09 NOTE — Telephone Encounter (Signed)
Requested medication (s) are due for refill today: yes  Requested medication (s) are on the active medication list: yes  Last refill:  diltiazem 09/28/21 #90/2, amlodipine 10/13/21  Future visit scheduled: yes  Notes to clinic:  Unable to refill per protocol, medication not assigned to the refill protocol.      Requested Prescriptions  Pending Prescriptions Disp Refills   amLODipine (NORVASC) 10 MG tablet      Sig: Take 1 tablet (10 mg total) by mouth daily.     There is no refill protocol information for this order     diltiazem (TIAZAC) 240 MG 24 hr capsule 90 capsule 2    Sig: Take 1 capsule (240 mg total) by mouth daily.     There is no refill protocol information for this order

## 2021-12-09 NOTE — Telephone Encounter (Signed)
Noted. Is the patient needing any refills of any medications?

## 2021-12-09 NOTE — Patient Outreach (Signed)
[facility]) Care Management Telephonic RN Care Manager Note   12/09/2021 Name:  Robert Chan MRN:  [id number] DOB:  September 18, 1956  Summary: Follow up outreach to patient He confirm he was assessed for personal care services annually now but does not feel he needs it at this time He does report chronic pain at intervals with decrease ability to complete ADLs/iADLs He reports no change in podiatry or dermatologist at this time  His concern today is related to medications  He would like to start using mail order services via [place] He reports [facility] but an incorrect dose of 160 mg vs 240 mg was noticed when he arrived home. He reports he has not taken any but has not followed up to see if pcp changed dose nor [facility]   Recommendations/Changes made from today's visit: Assessed for personal care services  Assisted patient by completing a call to [place] [facility] Spoke with Lorriane Shire to get pt benefits for mail order services option 1) [email or web address] mail order form send to address  2)New prescription request take 4-5 business days  3) Have MD send to fax  [phone] or number  [phone]  No charge only Rx charge co pay Processing time take 48 hours Pt will get a text message  Assisted with concern with incorrect Diltiazem dosage Conference calls with pt-Spoke with Dominique at [phone] to have a message sent to pcp Dr Raphael Gibney with Leeanne Rio at [facility] pharmacist who verifies Mr Stclair has not used [facility] and a profile is not available She does recommend he check to see if there has been a change in his diltiazem prior to returning the [facility] to [facility] main line to speak with a nurse Spoke with [place] who will send a note to a [facility] nurse to call patient back Pt voices understanding of what he needs to do to (verify if Diltiazem dose has changed and  [facility] if no changes for correct medicine)  Subjective: Robert Chan is an 66 y.o. year old male who is a primary patient of Elsie Stain, MD. The care management team was consulted for assistance with care management and/or care coordination needs.    Telephonic RN Care Manager completed Telephone Visit today.   Objective:  Medications Reviewed Today     Reviewed by Barbaraann Faster, RN (Registered Nurse) on 12/09/21 at [place] List Status: <None>   Medication Order Taking? Sig Documenting Provider Last Dose Status Informant  albuterol (PROVENTIL) (2.5 MG/3ML) 0.083% nebulizer solution [id number]  Take 3 mLs (2.5 mg total) by nebulization every 6 (six) hours as needed for wheezing or shortness of breath. Elsie Stain, MD  Active   albuterol (VENTOLIN HFA) 108 (90 Base) MCG/ACT inhaler [id number]  Inhale 1-2 puffs into the lungs every 6 (six) hours as needed for wheezing or shortness of breath. Elsie Stain, MD  Active   amLODipine (NORVASC) 10 MG tablet [id number]  Take 10 mg by mouth daily. [provider]  Active   ARIPiprazole (ABILIFY) 10 MG tablet [id number]  Take 1 tablet (10 mg total) by mouth at bedtime. Elsie Stain, MD  Active   aspirin 81 MG EC tablet [id number]  TAKE 1 TABLET (81 MG TOTAL) BY MOUTH DAILY. SWALLOW WHOLE. Elsie Stain, MD  Active   atorvastatin (LIPITOR) 40 MG  tablet [id number]  Take 1 tablet (40 mg total) by mouth daily. Elsie Stain, MD  Active   buPROPion (WELLBUTRIN XL) 150 MG 24 hr tablet [id number]  Take 1 tablet (150 mg total) by mouth every morning. Elsie Stain, MD  Active   busPIRone (BUSPAR) 10 MG tablet [id number]  Take 1 tablet (10 mg total) by mouth 3 (three) times daily. Prn for anxiety Elsie Stain, MD  Active   diltiazem [facility]) 240 MG 24 hr capsule [id number]  Take 1 capsule (240 mg total) by mouth daily. Elsie Stain, MD  Active   furosemide (LASIX) 20 MG tablet [id number]  Take 20 mg by  mouth daily. [provider]  Active   latanoprost (XALATAN) 0.005 % ophthalmic solution [id number]  Place 1 drop into both eyes nightly. Elsie Stain, MD  Active   [facility] 160-4.5 MCG/ACT inhaler [id number]  Inhale 2 puffs into the lungs in the morning and at bedtime. Elsie Stain, MD  Active   tacrolimus (PROTOPIC) 0.1 % ointment [id number]  SMARTSIG:Sparingly Topical Twice Daily [provider]  Active   valsartan (DIOVAN) 160 MG tablet [id number]  Take 1 tablet (160 mg total) by mouth daily. Elsie Stain, MD  Active   Vitamin D, Ergocalciferol, (DRISDOL) 1.25 MG (50000 UNIT) CAPS capsule [id number]  Take 1 capsule (50,000 Units total) by mouth once a week. Elsie Stain, MD  Active            Patient Active Problem List   Diagnosis Date Noted   PAD (peripheral artery disease) ([place]) 12/01/2021   Atopic dermatitis 04/22/2021   Former tobacco use    MDD (major depressive disorder), recurrent episode, severe ([place]) 09/08/2020   Vitamin D deficiency 06/23/2020   Coronary artery disease 06/19/2020   Homelessness 06/19/2020   History of non-ST elevation myocardial infarction (NSTEMI)    Foot callus 12/12/2019   Elevated serum creatinine 08/28/2019   Age-related nuclear cataract of right eye 09/13/2016   Benign hypertension 07/06/2016   Glaucoma 07/06/2016   Male erectile disorder 07/06/2016   Asthma 10/18/2012   COPD with chronic bronchitis ([place]) 10/18/2012   Past Medical History:  Diagnosis Date   Acute bilateral low back pain without sciatica 02/27/2021   Asthma    CAD (coronary artery disease)    Cataract    CKD (chronic kidney disease)    Cocaine use disorder, mild, abuse ([place])    COPD (chronic obstructive pulmonary disease) (HCC)    Coronary vasospasm ([place]) 10/07/2020   Depression    Hypertension    NSTEMI (non-ST elevated myocardial infarction) ([place])    Status post incision and drainage 04/22/2021   Suicidal ideation 06/05/2020     SDOH:   (Social Determinants of Health) assessments and interventions performed:    Care Plan  Review of patient past medical history, allergies, medications, health status, including review of consultants reports, laboratory and other test data, was performed as part of comprehensive evaluation for care management services.   Care Plan : RN CM plan of care  Updates made by Barbaraann Faster, RN since 12/09/2021 12:00 AM     Problem: Complex Care Coordination Needs and disease management in patient with HTN   Priority: High     Long-Range Goal: Establish Plan of Care for Management Complex SDOH Barriers, disease management and Care Coordination Needs in patient with HTN   Start Date: 09/07/2021  This Visit's Progress: On track  Recent Progress: On track  Priority: High  Note:   Current Barriers:  Knowledge Deficits related to plan of care for management of HTN and atopic dermatitis  Care Coordination needs related to Limited education about HTN, atopic dermatitis* Barriers, health behaviors, knowledge, some difficulty filling out medical forms 10/07/21 he confirms he has obtained a BP monitor since the last outreach 12/09/21 interest in starting mail order services vis [employer], [facility]- denies need for PCS at this time, remains with same podiatrist & dermatologist  RN CM Clinical Goal(s):  Patient will verbalize understanding of plan for management of HTN and atopic dermatitis as evidenced by improvement in BP values and healing of skin   through collaboration with RN Care manager, provider, and care team.   Interventions: Review of EPIC chart, appointments follow up outreaches for worsening symptoms, care coordination needs and disease management/education needs Inter-disciplinary care team collaboration (see longitudinal plan of care) Evaluation of current treatment plan related to  self management and patient's adherence to plan as established by provider 11/05/21  various in network dermatologists for his insurance plan provided for him to outreach to. Encouraged him to review his [employer] benefits for OTC, transportation , dental, fitness, vision, etc for 2023  12/09/21 various calls to [place], pcp/pcp pharmacy related to medication mail order, diltiazem  Hypertension Interventions: Goal Improving Long term goal  Last practice recorded BP readings:  BP Readings from Last 3 Encounters:  09/28/21 139/89  07/12/21 (!) 147/88  06/15/21 116/83  Most recent eGFR/CrCl:  Lab Results  Component Value Date   EGFR 67 09/28/2021    No components found for: CRCL  Evaluation of current treatment plan related to hypertension self management and patient's adherence to plan as established by provider; Reviewed medications with patient and discussed importance of compliance; Provided assistance with obtaining home blood pressure monitor via his [employer] OTC benefit catalog;  Counseled on the importance of exercise goals with target of 150 minutes per week Discussed plans with patient for ongoing care management follow up and provided patient with direct contact information for care management team; Reviewed scheduled/upcoming provider appointments including:  Assessed social determinant of health barriers;  10/07/21 outreach to dermatology office to speak with staff about his ineffective present treatment -sent patient a list of other in network dermatologist locally Attempted to assist patient with an outreach to his insurance customer service line to inquire about a possible [employer] gift card her reports he is pending. After an extensive wait time the patient agree to attempt to call back himself and confirmed the correct number listed on his insurance card  Patient Goals/Self-Care Activities: Patient will self administer medications as prescribed Patient will attend all scheduled provider appointments Patient will continue to perform ADL's independently Patient will  call provider office for new concerns or questions Obtain a BP cuff to monitor at home as ordered by MD He will obtain a note book to write down each of his providers with assistance of Ms Merrilyn Puma  Follow Up Plan:  Telephone follow up appointment with care management team member scheduled for:  within the next 30+ business days as agreed The patient has been provided with contact information for the care management team and has been advised to call with any health related questions or concerns.        Plan: The care management team will reach out to the patient again over the next 30+ business days.  Matan Steen L. Lavina Hamman, RN, BSN, [place] Coordinator Office number [phone]  [place] number [phone] Fax number [phone]

## 2021-12-10 MED ORDER — DILTIAZEM HCL ER BEADS 240 MG PO CP24: 240.0000 mg | ORAL_CAPSULE | Freq: Every day | ORAL | 0 refills | Status: DC

## 2021-12-10 NOTE — Addendum Note (Signed)
Addended by: Daisy Blossom, Annie Main L on: 12/10/2021 04:58 PM   Modules accepted: Orders

## 2021-12-13 NOTE — Progress Notes (Unsigned)
Established Patient Office Visit  Subjective:  Patient ID: Robert Chan, male    DOB: 11/18/1955  Age: 66 y.o. MRN: [id number]  CC: No chief complaint on file.   HPI Demetrios Byron presents for     Benign hypertension - Primary      Hypertension currently at goal at this time   No change in medications refills given        Relevant Medications    atorvastatin (LIPITOR) 40 MG tablet    diltiazem (TIAZAC) 240 MG 24 hr capsule    valsartan (DIOVAN) 160 MG tablet    Other Relevant Orders    Comprehensive metabolic panel    CBC with Differential/Platelet    Coronary artery disease    Relevant Medications    atorvastatin (LIPITOR) 40 MG tablet    diltiazem (TIAZAC) 240 MG 24 hr capsule    valsartan (DIOVAN) 160 MG tablet    Other Relevant Orders    Lipid panel        Respiratory    COPD with chronic bronchitis ([place])      Patient no longer smoking Continue Advair as prescribed albuterol as needed        Relevant Medications    SYMBICORT 160-4.5 MCG/ACT inhaler        Musculoskeletal and Integument    Atopic dermatitis      Stable at this time   Patient has follow-up with dermatology            Other    Elevated serum creatinine      Repeat metabolic profile        History of non-ST elevation myocardial infarction (NSTEMI)      Repeat lipid panel        Homelessness      Not currently homeless but still housing potentially unstable        Vitamin D deficiency      Refill vitamin D supplement        MDD (major depressive disorder), recurrent episode, severe ([place])      Continue current medication refer to licensed clinical social work        Relevant Medications    buPROPion (WELLBUTRIN XL) 150 MG 24 hr tablet    busPIRone (BUSPAR) 10 MG tablet    Former tobacco use      Not currently smoking        Other Visit Diagnoses       Need for immunization against influenza        Relevant Orders    Flu Vaccine QUAD High Dose(Fluad)    Simple  chronic bronchitis (HCC)        Relevant Medications    SYMBICORT 160-4.5 MCG/ACT inhaler    Colon cancer screening        Relevant Orders    Cologuard    Still needs cologuard/ fobt.    PCV 20 Past Medical History:  Diagnosis Date   Acute bilateral low back pain without sciatica 02/27/2021   Asthma    CAD (coronary artery disease)    Cataract    CKD (chronic kidney disease)    Cocaine use disorder, mild, abuse (HCC)    COPD (chronic obstructive pulmonary disease) (HCC)    Coronary vasospasm ([place]) 10/07/2020   Depression    Hypertension    NSTEMI (non-ST elevated myocardial infarction) ([place])    Status post incision and drainage 04/22/2021   Suicidal ideation 06/05/2020    Past Surgical History:  Procedure Laterality Date  APPENDECTOMY     EYE SURGERY     LEFT HEART CATH AND CORONARY ANGIOGRAPHY N/A 06/05/2020   Procedure: LEFT HEART CATH AND CORONARY ANGIOGRAPHY;  Surgeon: Nigel Mormon, MD;  Location: [facility];  Service: Cardiovascular;  Laterality: N/A;   PROSTATE SURGERY      Family History  Problem Relation Age of Onset   Hypertension Mother    Diabetes Mother    Hypertension Father    Hypertension Sister     Social History   Socioeconomic History   Marital status: Single    Spouse name: Not on file   Number of children: Not on file   Years of education: Not on file   Highest education level: Not on file  Occupational History   Not on file  Tobacco Use   Smoking status: Former    Types: Cigarettes    Quit date: 12/25/2020    Years since quitting: 0.9   Smokeless tobacco: Never   Tobacco comments:    smokes 3-4 cigarettes/day  Vaping Use   Vaping Use: Never used  Substance and Sexual Activity   Alcohol use: Yes    Comment: drinks 2x/wk, 2-3 40 oz drinks, last drink Friday   Drug use: Yes    Types: Cocaine    Comment: cocaine use once a month, last use Friday    Sexual activity: Not Currently  Other Topics Concern   Not on file   Social History Narrative   Had 9 sisters, one deceased    Had 3 brothers, all deceased   Family still live in [place] a Daughter and 6 grand daughters local   Social Determinants of Health   Financial Resource Strain: Low Risk    Difficulty of Paying Living Expenses: Not hard at all  Food Insecurity: No Food Insecurity   Worried About Charity fundraiser in the Last Year: Never true   Arboriculturist in the Last Year: Never true  Transportation Needs: No Transportation Needs   Lack of Transportation (Medical): No   Lack of Transportation (Non-Medical): No  Physical Activity: Not on file  Stress: No Stress Concern Present   Feeling of Stress : Only a little  Social Connections: Moderately Integrated   Frequency of Communication with Friends and Family: Three times a week   Frequency of Social Gatherings with Friends and Family: Three times a week   Attends Religious Services: 1 to 4 times per year   Active Member of Clubs or Organizations: Yes   Attends Archivist Meetings: 1 to 4 times per year   Marital Status: Never married  Intimate Partner Violence: Unknown   Fear of Current or Ex-Partner: No   Emotionally Abused: No   Physically Abused: No   Sexually Abused: Not on file    Outpatient Medications Prior to Visit  Medication Sig Dispense Refill   albuterol (PROVENTIL) (2.5 MG/3ML) 0.083% nebulizer solution Take 3 mLs (2.5 mg total) by nebulization every 6 (six) hours as needed for wheezing or shortness of breath. 150 mL 1   albuterol (VENTOLIN HFA) 108 (90 Base) MCG/ACT inhaler Inhale 1-2 puffs into the lungs every 6 (six) hours as needed for wheezing or shortness of breath. 90 g 2   amLODipine (NORVASC) 10 MG tablet Take 10 mg by mouth daily.     ARIPiprazole (ABILIFY) 10 MG tablet Take 1 tablet (10 mg total) by mouth at bedtime. 30 tablet 3   aspirin  81 MG EC tablet TAKE 1 TABLET (81 MG TOTAL) BY MOUTH DAILY. SWALLOW WHOLE. 30 tablet 11    atorvastatin (LIPITOR) 40 MG tablet Take 1 tablet (40 mg total) by mouth daily. 90 tablet 1   buPROPion (WELLBUTRIN XL) 150 MG 24 hr tablet Take 1 tablet (150 mg total) by mouth every morning. 60 tablet 3   busPIRone (BUSPAR) 10 MG tablet Take 1 tablet (10 mg total) by mouth 3 (three) times daily. Prn for anxiety 90 tablet 2   diltiazem (TIAZAC) 240 MG 24 hr capsule Take 1 capsule (240 mg total) by mouth daily. 90 capsule 0   furosemide (LASIX) 20 MG tablet Take 20 mg by mouth daily.     gentamicin ointment (GARAMYCIN) 0.1 % SMARTSIG:1 Topical Daily     latanoprost (XALATAN) 0.005 % ophthalmic solution Place 1 drop into both eyes nightly. 2.5 mL 3   SYMBICORT 160-4.5 MCG/ACT inhaler Inhale 2 puffs into the lungs in the morning and at bedtime. 10.2 g 11   tacrolimus (PROTOPIC) 0.1 % ointment SMARTSIG:Sparingly Topical Twice Daily     valsartan (DIOVAN) 160 MG tablet Take 1 tablet (160 mg total) by mouth daily. 90 tablet 3   Vitamin D, Ergocalciferol, (DRISDOL) 1.25 MG (50000 UNIT) CAPS capsule Take 1 capsule (50,000 Units total) by mouth once a week. 12 capsule 1   No facility-administered medications prior to visit.    Allergies  Allergen Reactions   Shellfish Allergy Hives and Rash    ROS Review of Systems    Objective:    Physical Exam  There were no vitals taken for this visit. Wt Readings from Last 3 Encounters:  09/28/21 (!) 306 lb 3.2 oz (138.9 kg)  06/15/21 289 lb 3.2 oz (131.2 kg)  04/15/21 280 lb (127 kg)     Health Maintenance Due  Topic Date Due   Fecal DNA (Cologuard)  Never done   Zoster Vaccines- Shingrix (1 of 2) Never done    There are no preventive care reminders to display for this patient.  Lab Results  Component Value Date   TSH 2.640 09/09/2020   Lab Results  Component Value Date   WBC 9.7 09/28/2021   HGB 12.3 (L) 09/28/2021   HCT 36.4 (L) 09/28/2021   MCV 89 09/28/2021   PLT 281 09/28/2021   Lab Results  Component Value Date   NA 143  09/28/2021   K 4.4 09/28/2021   CO2 19 (L) 09/28/2021   GLUCOSE 98 09/28/2021   BUN 13 09/28/2021   CREATININE 1.19 09/28/2021   BILITOT 0.6 09/28/2021   ALKPHOS 78 09/28/2021   AST 25 09/28/2021   ALT 18 09/28/2021   PROT 7.5 09/28/2021   ALBUMIN 4.4 09/28/2021   CALCIUM 8.9 09/28/2021   ANIONGAP 8 04/15/2021   EGFR 67 09/28/2021   Lab Results  Component Value Date   CHOL 155 09/28/2021   Lab Results  Component Value Date   HDL 57 09/28/2021   Lab Results  Component Value Date   LDLCALC 80 09/28/2021   Lab Results  Component Value Date   TRIG 101 09/28/2021   Lab Results  Component Value Date   CHOLHDL 2.7 09/28/2021   Lab Results  Component Value Date   HGBA1C 4.8 09/07/2020      Assessment & Plan:   Problem List Items Addressed This Visit   None   No orders of the defined types were placed in this encounter.   Follow-up: No follow-ups on file.  Asencion Noble, MD

## 2021-12-14 ENCOUNTER — Ambulatory Visit: Payer: Medicare HMO | Admitting: Critical Care Medicine

## 2021-12-14 NOTE — Patient Instructions (Signed)
Patient presents for vaccine injection today. Patient tolerated injection well and was observed without any concerns.  

## 2021-12-15 DIAGNOSIS — L2089 Other atopic dermatitis: Secondary | ICD-10-CM | POA: Diagnosis not present

## 2021-12-15 DIAGNOSIS — R600 Localized edema: Secondary | ICD-10-CM | POA: Diagnosis not present

## 2021-12-15 DIAGNOSIS — L301 Dyshidrosis [pompholyx]: Secondary | ICD-10-CM | POA: Diagnosis not present

## 2021-12-15 DIAGNOSIS — I872 Venous insufficiency (chronic) (peripheral): Secondary | ICD-10-CM | POA: Diagnosis not present

## 2021-12-15 DIAGNOSIS — L810 Postinflammatory hyperpigmentation: Secondary | ICD-10-CM | POA: Diagnosis not present

## 2021-12-15 DIAGNOSIS — B353 Tinea pedis: Secondary | ICD-10-CM | POA: Diagnosis not present

## 2021-12-18 ENCOUNTER — Other Ambulatory Visit: Payer: Self-pay | Admitting: *Deleted

## 2021-12-18 ENCOUNTER — Other Ambulatory Visit: Payer: Self-pay

## 2021-12-18 ENCOUNTER — Encounter: Payer: Self-pay | Admitting: *Deleted

## 2021-12-18 NOTE — Patient Outreach (Signed)
Robert Chan) Care Management Telephonic RN Care Manager Note   12/18/2021 Name:  Robert Chan MRN:  [id number] DOB:  Jan 11, 1956  Summary: Follow up outreach Robert Chan reports his visiting nurse checked his BP today, SBP 131/75 68 He voices he received a return call from a pcp RN to review his HTN medicine and he is not understanding why he is on valsartan 160 mg and diltiazem 240 mg He is to follow up with his pcp on 01/11/22 Patient voiced understanding of importance of taking his HTN medicine and appreciation for the review of information   He ventilated his feelings about his BP and his family values   Recommendations/Changes made from today's visit: Assessed for worsening symptoms Followed up on medicine concerns Completed medication adherence assessment  Reviewed the purpose of valsartan (treat high blood pressure, heart failure, and diabetic kidney disease) and diltiazem ( treat high blood pressure, angina, and certain heart arrhythmias) to include differences and importance related to his 2017 dx HTN  & CAD Reviewed importance of checking BP at home  Encouraged lifestyle changes to assist with possible discontinuation of medication with assist of MD Allowed him to ventilate his feelings  With review of EmmiManager noted Robert Riddles is not reviewing the EMMI education e-mailed to him on HTN and CAD +    Subjective: Robert Chan is an 66 y.o. year old male who is a primary patient of Elsie Stain, MD. The care management team was consulted for assistance with care management and/or care coordination needs.    Telephonic RN Care Manager completed Telephone Visit today.   Objective:  Medications Reviewed Today     Reviewed by Barbaraann Faster, RN (Registered Nurse) on 12/18/21 at 58  Med List Status: <None>   Medication Order Taking? Sig Documenting Provider Last Dose Status Informant  albuterol (PROVENTIL) (2.5 MG/3ML) 0.083% nebulizer solution  [id number]  Take 3 mLs (2.5 mg total) by nebulization every 6 (six) hours as needed for wheezing or shortness of breath. Elsie Stain, MD  Active   albuterol (VENTOLIN HFA) 108 (90 Base) MCG/ACT inhaler [id number]  Inhale 1-2 puffs into the lungs every 6 (six) hours as needed for wheezing or shortness of breath. Elsie Stain, MD  Active   amLODipine (NORVASC) 10 MG tablet [id number]  Take 10 mg by mouth daily. [provider]  Active   ARIPiprazole (ABILIFY) 10 MG tablet [id number]  Take 1 tablet (10 mg total) by mouth at bedtime. Elsie Stain, MD  Active   aspirin 81 MG EC tablet [id number]  TAKE 1 TABLET (81 MG TOTAL) BY MOUTH DAILY. SWALLOW WHOLE. Elsie Stain, MD  Active   atorvastatin (LIPITOR) 40 MG tablet [id number]  Take 1 tablet (40 mg total) by mouth daily. Elsie Stain, MD  Active   buPROPion (WELLBUTRIN XL) 150 MG 24 hr tablet [id number]  Take 1 tablet (150 mg total) by mouth every morning. Elsie Stain, MD  Active   busPIRone (BUSPAR) 10 MG tablet [id number]  Take 1 tablet (10 mg total) by mouth 3 (three) times daily. Prn for anxiety Elsie Stain, MD  Active   clobetasol ointment (TEMOVATE) 0.05 % [id number]  SMARTSIG:Sparingly Topical Twice Daily [provider]  Active   diltiazem (TIAZAC) 240 MG 24 hr capsule [id number]  Take 1 capsule (240 mg total) by mouth daily. Elsie Stain, MD  Active   furosemide (LASIX) 20 MG tablet [id number]  Take 20 mg by mouth daily.  [provider]  Active   gentamicin ointment (GARAMYCIN) 0.1 % [id number]  SMARTSIG:1 Topical Daily [provider]  Active   latanoprost (XALATAN) 0.005 % ophthalmic solution [id number]  Place 1 drop into both eyes nightly. Elsie Stain, MD  Active   [facility] 160-4.5 MCG/ACT inhaler [id number]  Inhale 2 puffs into the lungs in the morning and at bedtime. Elsie Stain, MD  Active   tacrolimus (PROTOPIC) 0.1 % ointment [id number]  SMARTSIG:Sparingly Topical  Twice Daily [provider]  Active   valsartan (DIOVAN) 160 MG tablet [id number]  Take 1 tablet (160 mg total) by mouth daily. Elsie Stain, MD  Active   Vitamin D, Ergocalciferol, (DRISDOL) 1.25 MG (50000 UNIT) CAPS capsule [id number]  Take 1 capsule (50,000 Units total) by mouth once a week. Elsie Stain, MD  Active              SDOH:  (Social Determinants of Health) assessments and interventions performed:  SDOH Interventions    Flowsheet Row Most Recent Value  SDOH Interventions   Food Insecurity Interventions Intervention Not Indicated  Financial Strain Interventions Intervention Not Indicated  Housing Interventions Intervention Not Indicated  Intimate Partner Violence Interventions Intervention Not Indicated  Social Connections Interventions Intervention Not Indicated  Transportation Interventions Intervention Not Indicated       Care Plan  Review of patient past medical history, allergies, medications, health status, including review of consultants reports, laboratory and other test data, was performed as part of comprehensive evaluation for care management services.   Care Plan : RN CM plan of care  Updates made by Barbaraann Faster, RN since 12/18/2021 12:00 AM     Problem: Complex Care Coordination Needs and disease management in patient with HTN   Priority: High     Long-Range Goal: Establish Plan of Care for Management Complex SDOH Barriers, disease management and Care Coordination Needs in patient with HTN   Start Date: 09/07/2021  This Visit's Progress: On track  Recent Progress: On track  Priority: High  Note:   Current Barriers:  Knowledge Deficits related to plan of care for management of CAD, HTN, and atopic dermatitis  Care Coordination needs related to Limited education about CAD, HTN, atopic dermatitis* Barriers, health behaviors, knowledge, some difficulty filling out medical forms 12/09/21 interest in starting mail order services  vis [employer], diltiazem fill discrepancy via [facility]- denies need for PCS at this time, remains with same podiatrist & dermatologist 12/18/21 he confirms the BP monitor he obtained is not "the right one" and he will need to purchase another or exchange it with his [employer] benefit, but his home health RN has taken BP   RN CM Clinical Goal(s):  Patient will verbalize understanding of plan for management of CAD, HTN, and atopic dermatitis as evidenced by improvement in BP values and healing of skin   through collaboration with RN Care manager, provider, and care team.   Interventions: Review of EPIC chart, appointments follow up outreaches for worsening symptoms, care coordination needs and disease management/education needs Inter-disciplinary care team collaboration (see longitudinal plan of care) Evaluation of current treatment plan related to  self management and patient's adherence to plan as established by provider 11/05/21 various in network dermatologists for his insurance plan provided for him to outreach to. Encouraged him to review his [place] benefits for OTC, transportation , dental, fitness, vision, etc for 2023  12/09/21 various calls to [place], pcp/pcp pharmacy related to medication mail order, diltiazem 12/18/21 Assessed for  worsening symptoms Followed up on medicine concerns Completed medication adherence assessment  Reviewed the purpose of valsartan (treat high blood pressure, heart failure, and diabetic kidney disease) and diltiazem ( treat high blood pressure, angina, and certain heart arrhythmias) to include differences and importance related to his 2017 dx HTN  & CAD Reviewed importance of checking BP at home  Encouraged lifestyle changes to assist with possible discontinuation of medication with assist of MD Allowed him to ventilate his feelings  With review of EmmiManager noted Robert Hellen is not reviewing the EMMI education e-mailed to him on HTN and CAD +  Hypertension Interventions: Status  On track short term goal 12/18/21 BP at home today 131/75 taken by [facility] RN  Last practice recorded BP readings:  BP Readings from Last 3 Encounters:  12/18/21 131/75  09/28/21 139/89  07/12/21 (!) 147/88  Most recent eGFR/CrCl:  Lab Results  Component Value Date   EGFR 67 09/28/2021    No components found for: CRCL  Evaluation of current treatment plan related to hypertension self management and patient's adherence to plan as established by provider; Reviewed medications with patient and discussed importance of compliance; Provided assistance with obtaining home blood pressure monitor via his [employer] OTC benefit catalog;  Counseled on the importance of exercise goals with target of 150 minutes per week Discussed plans with patient for ongoing care management follow up and provided patient with direct contact information for care management team; Reviewed scheduled/upcoming provider appointments including:  Assessed social determinant of health barriers;  10/07/21 outreach to dermatology office to speak with staff about his ineffective present treatment -sent patient a list of other in network dermatologist locally Attempted to assist patient with an outreach to his insurance customer service line to inquire about a possible [employer] gift card her reports he is pending. After an extensive wait time the patient agree to attempt to call back himself and confirmed the correct number listed on his insurance card   CAD Interventions: (Status:  New goal.) Long Term Goal Assessed understanding of CAD diagnosis Medications reviewed including medications utilized in CAD treatment plan Provided education on importance of blood pressure control in management of CAD Reviewed Importance of taking all medications as prescribed Reviewed Importance of attending all scheduled provider appointments Screening for signs and symptoms of depression related to chronic disease state Assessed social determinant of  health barriers   Patient Goals/Self-Care Activities: Patient will self administer medications as prescribed Patient will attend all scheduled provider appointments Patient will continue to perform ADL's independently Patient will call provider office for new concerns or questions Obtain a correct BP cuff to monitor at home as ordered by MD He will obtain a note book to write down each of his providers with assistance of Ms Merrilyn Puma  Follow Up Plan:  Telephone follow up appointment with care management team member scheduled for:  within the next 30+ business days as agreed The patient has been provided with contact information for the care management team and has been advised to call with any health related questions or concerns.        Lizzeth Meder L. Lavina Hamman, RN, BSN, [place] Coordinator Office number [phone] [address] [facility] number [phone] Fax number [phone]

## 2021-12-23 ENCOUNTER — Telehealth: Payer: Self-pay

## 2021-12-23 NOTE — Telephone Encounter (Signed)
Call placed to [facility], spoke to [place] who confirmed that they received the referral and completed the assessment. He stated that the patient was not approved for PCS because he did not meet the criteria

## 2022-01-04 NOTE — Telephone Encounter (Signed)
Error

## 2022-01-06 NOTE — Progress Notes (Unsigned)
Cardiology Office Note   Date:  01/06/2022   ID:  Robert Chan, DOB June 16, 1956, MRN [id number]  PCP:  Elsie Stain, MD  Cardiologist:  Kathyrn Drown, NP   No chief complaint on file.     History of Present Illness: Robert Chan is a 66 y.o. male who presents for follow up, seen for Dr. Radford Pax.   [name] has a hx of CAD, CKD, COPD, depression and HTN. He presented in 06/2020 with NSTEMI and underwent cardiac cath which showed 30% prox LAD and mid LAD myocardial bridge and 20% mid RCA with normal LVF and normal LVEDP. Echocardiogram showed normal LVF with G1DD and no valvular heart disease. 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   He was then referred to Dr.Turner to establish cardiac care 12/2020. He was noted to have chronic DOE related to COPD and asthma. He also had complaints of intermittent chest pressure a few times a week. He was also having issues with LE edema although admitted to adding salt and eating  bacon, sausage, and chips. Plan at that time was was for him to continue ASA, statin. His amlodipine was changed to Cardizem to help reduce cardiac bridging. He was set up for a Lexiscan stress test 01/08/21  that showed no ischemia or infarct. Echocardiogram was mentioned but appears this was never scheduled.   More recently, he has been followed closely with Dr. Joya Gaskins. He was recently seen by [facility] who did a home visit and did a peripheral artery screen which showed possible artery blockage in right leg. He was then scheduled to see Dr. Joya Gaskins once again 01/11/22.          ASCAD -s/p remote NSTEMI in Aug 2021 -cardiac cath showed minimal nno obstructive CAD with 20% mid RCA and 30% prox LAD and mid LAD myocardial bridge.  -it was felt that his NSTEMI was due to demand ischemia from coronary vasospasm in the setting of cocaine -he had been having chest tightness but it is atypical in that there is  no radiation and no associated sx of nausea or diaphoresis but does come on with exertion.   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 that may be causing sx -continue ASA and statin -change Amlodipine to Cardizem CD '240mg'$  daily to help reduce myocardial bridging -options are limited for treatment of coronary vasospasm due to hx of cocaine abuse as well as soft BP -I will get a Lexiscan myoview to rule out ischemia -Shared Decision Making/Informed Consent The risks [chest pain, shortness of breath, cardiac arrhythmias, dizziness, blood pressure fluctuations, myocardial infarction, stroke/transient ischemic attack, nausea, vomiting, allergic reaction, radiation exposure, metallic taste sensation and life-threatening complications (estimated to be 1 in 10,000)], benefits (risk stratification, diagnosing coronary artery disease, treatment guidance) and alternatives of a nuclear stress test were discussed in detail with [name] and he agrees to proceed.   2.  HLD -LDL goal < 70 -LDL was 86 in Nov -increase atorvastatin to '80mg'$  daily -repeat FLP and ALT in 6 weeks   3.  HTN -BP controlled on exam today -stop amloidipine -change to Cardizem CD '240mg'$  daily due to myocardial bridge -hold Valsartan HCT for now due to soft BP -SCr stable at 1.26 and K+ 2.7   4.  Dizziness/hypotension -I will stop Valsartan HCT -Followup with PA in 1 week for repeat orthostatic BPs   5.  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   Depression    Hypertension    NSTEMI (non-ST elevated myocardial infarction) ([place])    Status post incision and drainage 04/22/2021   Suicidal ideation 06/05/2020    Past Surgical History:  Procedure Laterality Date   APPENDECTOMY     EYE SURGERY     LEFT HEART CATH AND CORONARY ANGIOGRAPHY N/A 06/05/2020   Procedure: LEFT HEART CATH AND CORONARY ANGIOGRAPHY;  Surgeon: Nigel Mormon, MD;  Location: [facility];  Service: Cardiovascular;  Laterality: N/A;   PROSTATE SURGERY       Current Outpatient Medications  Medication Sig Dispense Refill   albuterol (PROVENTIL) (2.5 MG/3ML) 0.083% nebulizer solution Take 3 mLs (2.5 mg total) by nebulization every 6 (six) hours as needed for wheezing or shortness of breath. 150 mL 1   albuterol (VENTOLIN HFA) 108 (90 Base) MCG/ACT inhaler Inhale 1-2 puffs into the lungs every 6 (six) hours as needed for wheezing or shortness of breath. 90 g 2   amLODipine (NORVASC) 10 MG tablet Take 10 mg by mouth daily.     ARIPiprazole (ABILIFY) 10 MG tablet Take 1 tablet (10 mg total) by mouth at bedtime. 30 tablet 3   aspirin 81 MG EC tablet TAKE 1 TABLET (81 MG TOTAL) BY MOUTH DAILY. SWALLOW WHOLE. 30 tablet 11   atorvastatin (LIPITOR) 40 MG tablet Take 1 tablet (40 mg total) by mouth daily. 90 tablet 1   buPROPion (WELLBUTRIN XL) 150 MG 24 hr tablet Take 1 tablet (150 mg total) by mouth every morning. 60 tablet 3   busPIRone (BUSPAR) 10 MG tablet Take 1 tablet (10 mg total) by mouth 3 (three) times daily. Prn for anxiety 90 tablet 2   clobetasol ointment (TEMOVATE) 0.05 % SMARTSIG:Sparingly Topical Twice Daily     diltiazem (TIAZAC) 240 MG 24 hr capsule Take 1 capsule (240 mg total) by mouth daily. 90 capsule 0   furosemide (LASIX) 20 MG tablet Take 20 mg by mouth daily.     gentamicin ointment (GARAMYCIN) 0.1 % SMARTSIG:1 Topical Daily     latanoprost (XALATAN) 0.005 % ophthalmic  solution Place 1 drop into both eyes nightly. 2.5 mL 3   sulfamethoxazole-trimethoprim (BACTRIM DS) 800-160 MG tablet Take 1 tablet by mouth 2 (two) times daily.     SYMBICORT 160-4.5 MCG/ACT inhaler Inhale 2 puffs into the lungs in the morning and at bedtime. 10.2 g 11   tacrolimus (PROTOPIC) 0.1 % ointment SMARTSIG:Sparingly Topical Twice Daily     valsartan (DIOVAN) 160 MG tablet Take 1 tablet (160 mg total) by mouth daily. 90 tablet 3   Vitamin D, Ergocalciferol, (DRISDOL) 1.25 MG (50000 UNIT) CAPS capsule Take 1 capsule (50,000 Units total) by mouth once a week. 12 capsule 1   No current facility-administered medications for this visit.  Allergies:   Shellfish allergy    Social History:  The patient  reports that he quit smoking about a year ago. His smoking use included cigarettes. He has never used smokeless tobacco. He reports current alcohol use. He reports current drug use. Drug: Cocaine.   Family History:  The patient's ***family history includes Diabetes in his mother; Hypertension in his father, mother, and sister.    ROS:  Please see the history of present illness.   Otherwise, review of systems are positive for {NONE DEFAULTED:18576}.   All other systems are reviewed and negative.    PHYSICAL EXAM: VS:  There were no vitals taken for this visit. , BMI There is no height or weight on file to calculate BMI. GEN: Well nourished, well developed, in no acute distress HEENT: normal Neck: no JVD, carotid bruits, or masses Cardiac: ***RRR; no murmurs, rubs, or gallops,no edema  Respiratory:  clear to auscultation bilaterally, normal work of breathing GI: soft, nontender, nondistended, + BS MS: no deformity or atrophy Skin: warm and dry, no rash Neuro:  Strength and sensation are intact Psych: euthymic mood, full affect   EKG:  EKG {ACTION; IS/IS LZJ:[id number]} ordered today. The ekg ordered today demonstrates ***   Recent Labs: 09/28/2021: ALT 18; BUN 13; Creatinine,  Ser 1.19; Hemoglobin 12.3; Platelets 281; Potassium 4.4; Sodium 143    Lipid Panel    Component Value Date/Time   CHOL 155 09/28/2021 1514   TRIG 101 09/28/2021 1514   HDL 57 09/28/2021 1514   CHOLHDL 2.7 09/28/2021 1514   CHOLHDL 2.4 09/09/2020 0654   VLDL 15 09/09/2020 0654   LDLCALC 80 09/28/2021 1514      Wt Readings from Last 3 Encounters:  09/28/21 (!) 306 lb 3.2 oz (138.9 kg)  06/15/21 289 lb 3.2 oz (131.2 kg)  04/15/21 280 lb (127 kg)      Other studies Reviewed: Additional studies/ records that were reviewed today include: ***. Review of the above records demonstrates: ***  Lexiscan stress test 01/08/21:  There was no ST segment deviation noted during stress. Nuclear stress EF: 50%. The left ventricular ejection fraction is mildly decreased (45-54%). The study is normal. This is a low risk study.   1. Fixed perfusion defect in apical inferior wall and apex with normal wall motion, suggests artifact 2. Low risk study  ASSESSMENT AND PLAN:  1.  ***   Current medicines are reviewed at length with the patient today.  The patient {ACTIONS; HAS/DOES NOT HAVE:19233} concerns regarding medicines.  The following changes have been made:  {PLAN; NO CHANGE:13088:s}  Labs/ tests ordered today include: *** No orders of the defined types were placed in this encounter.    Disposition:   FU with *** in {gen number 9-02:409735} {Days to years:10300}  Signed, Kathyrn Drown, NP  01/06/2022 3:16 PM    [facility] [address], [place], [place]  [address] Phone: [phone]; Fax: [phone]

## 2022-01-09 NOTE — Progress Notes (Signed)
Established Patient Office Visit  Subjective:  Patient ID: Robert Chan, male    DOB: 03/27/1956  Age: 66 y.o. MRN: [id number]  CC:  Chief Complaint  Patient presents with   Hypertension    HPI Robert Chan presents for primary care follow-up.  The patient has been compliant with the valsartan and amlodipine but ran out of the blood pressure medicine about a week ago.  On arrival blood pressure is actually quite good 138/80.  Patient has no other real complaints at this time.  He did not process his Cologuard kit was sent another when he has yet to reprocess it.  The initial kit did not have sufficient samples.  Patient does agree to receive a Prevnar 20 vaccine at this visit. The patient continues with following a smoke-free habit.  His diet still is variable.  He has no other real complaints at this visit. Past Medical History:  Diagnosis Date   Acute bilateral low back pain without sciatica 02/27/2021   Asthma    CAD (coronary artery disease)    Cataract    CKD (chronic kidney disease)    Cocaine use disorder, mild, abuse (HCC)    COPD (chronic obstructive pulmonary disease) (HCC)    Coronary vasospasm ([place]) 10/07/2020   Depression    Elevated serum creatinine 08/28/2019   Homelessness 06/19/2020   Hypertension    NSTEMI (non-ST elevated myocardial infarction) [facility])    Status post incision and drainage 04/22/2021   Suicidal ideation 06/05/2020    Past Surgical History:  Procedure Laterality Date   APPENDECTOMY     EYE SURGERY     LEFT HEART CATH AND CORONARY ANGIOGRAPHY N/A 06/05/2020   Procedure: LEFT HEART CATH AND CORONARY ANGIOGRAPHY;  Surgeon: Nigel Mormon, MD;  Location: [place];  Service: Cardiovascular;  Laterality: N/A;   PROSTATE SURGERY      Family History  Problem Relation Age of Onset   Hypertension Mother    Diabetes Mother    Hypertension Father    Hypertension Sister     Social History   Socioeconomic History   Marital  status: Single    Spouse name: Not on file   Number of children: Not on file   Years of education: Not on file   Highest education level: Not on file  Occupational History   Not on file  Tobacco Use   Smoking status: Former    Types: Cigarettes    Quit date: 12/25/2020    Years since quitting: 1.0   Smokeless tobacco: Never   Tobacco comments:    smokes 3-4 cigarettes/day  Vaping Use   Vaping Use: Never used  Substance and Sexual Activity   Alcohol use: Yes    Comment: drinks 2x/wk, 2-3 40 oz drinks, last drink Friday   Drug use: Yes    Types: Cocaine    Comment: cocaine use once a month, last use Friday    Sexual activity: Not Currently  Other Topics Concern   Not on file  Social History Narrative   His mother passed at 50 (when he was 6 years old) and his father raised him and siblings   His parents were pastors as a lot of his sisters and other family members are today   There was a total of 13 children in his family Had 39 sisters, one deceased , Had 3 brothers, all deceased   He is the youngest of the 23 and was "always in trouble in my younger years"  Family still live in [place], some in [place], [place]   Has a Daughter and son with a total of 10 grandchildren (6 grand daughters local and 4 grandsons)    Values family   Social Determinants of Health   Financial Resource Strain: Low Risk    Difficulty of Paying Living Expenses: Not hard at all  Food Insecurity: No Food Insecurity   Worried About Charity fundraiser in the Last Year: Never true   Arboriculturist in the Last Year: Never true  Transportation Needs: No Transportation Needs   Lack of Transportation (Medical): No   Lack of Transportation (Non-Medical): No  Physical Activity: Not on file  Stress: No Stress Concern Present   Feeling of Stress : Only a little  Social Connections: Moderately Integrated   Frequency of Communication with Friends and Family: Three times a week   Frequency of Social Gatherings  with Friends and Family: Three times a week   Attends Religious Services: 1 to 4 times per year   Active Member of Clubs or Organizations: Yes   Attends Archivist Meetings: 1 to 4 times per year   Marital Status: Never married  Human resources officer Violence: Not At Risk   Fear of Current or Ex-Partner: No   Emotionally Abused: No   Physically Abused: No   Sexually Abused: No    Outpatient Medications Prior to Visit  Medication Sig Dispense Refill   sulfamethoxazole-trimethoprim (BACTRIM DS) 800-160 MG tablet Take 1 tablet by mouth 2 (two) times daily.     SYMBICORT 160-4.5 MCG/ACT inhaler Inhale 2 puffs into the lungs in the morning and at bedtime. 10.2 g 11   albuterol (PROVENTIL) (2.5 MG/3ML) 0.083% nebulizer solution Take 3 mLs (2.5 mg total) by nebulization every 6 (six) hours as needed for wheezing or shortness of breath. 150 mL 1   amLODipine (NORVASC) 10 MG tablet Take 10 mg by mouth daily.     gentamicin ointment (GARAMYCIN) 0.1 % SMARTSIG:1 Topical Daily     albuterol (VENTOLIN HFA) 108 (90 Base) MCG/ACT inhaler Inhale 1-2 puffs into the lungs every 6 (six) hours as needed for wheezing or shortness of breath. (Patient not taking: Reported on 01/11/2022) 90 g 2   ARIPiprazole (ABILIFY) 10 MG tablet Take 1 tablet (10 mg total) by mouth at bedtime. (Patient not taking: Reported on 01/11/2022) 30 tablet 3   aspirin 81 MG EC tablet TAKE 1 TABLET (81 MG TOTAL) BY MOUTH DAILY. SWALLOW WHOLE. (Patient not taking: Reported on 01/11/2022) 30 tablet 11   atorvastatin (LIPITOR) 40 MG tablet Take 1 tablet (40 mg total) by mouth daily. (Patient not taking: Reported on 01/11/2022) 90 tablet 1   buPROPion (WELLBUTRIN XL) 150 MG 24 hr tablet Take 1 tablet (150 mg total) by mouth every morning. (Patient not taking: Reported on 01/11/2022) 60 tablet 3   busPIRone (BUSPAR) 10 MG tablet Take 1 tablet (10 mg total) by mouth 3 (three) times daily. Prn for anxiety (Patient not taking: Reported on  01/11/2022) 90 tablet 2   clobetasol ointment (TEMOVATE) 0.05 % SMARTSIG:Sparingly Topical Twice Daily (Patient not taking: Reported on 01/11/2022)     diltiazem (TIAZAC) 240 MG 24 hr capsule Take 1 capsule (240 mg total) by mouth daily. (Patient not taking: Reported on 01/11/2022) 90 capsule 0   furosemide (LASIX) 20 MG tablet Take 20 mg by mouth daily. (Patient not taking: Reported on 01/11/2022)     latanoprost (XALATAN) 0.005 % ophthalmic solution Place 1  drop into both eyes nightly. (Patient not taking: Reported on 01/11/2022) 2.5 mL 3   tacrolimus (PROTOPIC) 0.1 % ointment SMARTSIG:Sparingly Topical Twice Daily (Patient not taking: Reported on 01/11/2022)     valsartan (DIOVAN) 160 MG tablet Take 1 tablet (160 mg total) by mouth daily. (Patient not taking: Reported on 01/11/2022) 90 tablet 3   Vitamin D, Ergocalciferol, (DRISDOL) 1.25 MG (50000 UNIT) CAPS capsule Take 1 capsule (50,000 Units total) by mouth once a week. (Patient not taking: Reported on 01/11/2022) 12 capsule 1   No facility-administered medications prior to visit.    Allergies  Allergen Reactions   Shellfish Allergy Hives and Rash    ROS Review of Systems  Constitutional: Negative.   HENT: Negative.  Negative for ear pain, postnasal drip, rhinorrhea, sinus pressure, sore throat, trouble swallowing and voice change.   Eyes: Negative.   Respiratory: Negative.  Negative for apnea, cough, choking, chest tightness, shortness of breath, wheezing and stridor.   Cardiovascular: Negative.  Negative for chest pain, palpitations and leg swelling.  Gastrointestinal: Negative.  Negative for abdominal distention, abdominal pain, nausea and vomiting.  Genitourinary: Negative.   Musculoskeletal: Negative.  Negative for arthralgias and myalgias.  Skin: Negative.  Negative for rash.  Allergic/Immunologic: Negative.  Negative for environmental allergies and food allergies.  Neurological: Negative.  Negative for dizziness, syncope, weakness  and headaches.  Hematological: Negative.  Negative for adenopathy. Does not bruise/bleed easily.  Psychiatric/Behavioral: Negative.  Negative for agitation and sleep disturbance. The patient is not nervous/anxious.      Objective:    Physical Exam Vitals reviewed.  Constitutional:      Appearance: Normal appearance. He is well-developed. He is obese. He is not diaphoretic.  HENT:     Head: Normocephalic and atraumatic.     Nose: No nasal deformity, septal deviation, mucosal edema or rhinorrhea.     Right Sinus: No maxillary sinus tenderness or frontal sinus tenderness.     Left Sinus: No maxillary sinus tenderness or frontal sinus tenderness.     Mouth/Throat:     Pharynx: No oropharyngeal exudate.  Eyes:     General: No scleral icterus.    Conjunctiva/sclera: Conjunctivae normal.     Pupils: Pupils are equal, round, and reactive to light.  Neck:     Thyroid: No thyromegaly.     Vascular: No carotid bruit or JVD.     Trachea: Trachea normal. No tracheal tenderness or tracheal deviation.  Cardiovascular:     Rate and Rhythm: Normal rate and regular rhythm.     Chest Wall: PMI is not displaced.     Pulses: Normal pulses. No decreased pulses.     Heart sounds: Normal heart sounds, S1 normal and S2 normal. Heart sounds not distant. No murmur heard. No systolic murmur is present.  No diastolic murmur is present.    No friction rub. No gallop. No S3 or S4 sounds.  Pulmonary:     Effort: Pulmonary effort is normal. No tachypnea, accessory muscle usage or respiratory distress.     Breath sounds: Normal breath sounds. No stridor. No decreased breath sounds, wheezing, rhonchi or rales.  Chest:     Chest wall: No tenderness.  Abdominal:     General: Bowel sounds are normal. There is no distension.     Palpations: Abdomen is soft. Abdomen is not rigid.     Tenderness: There is no abdominal tenderness. There is no guarding or rebound.  Musculoskeletal:        General: Normal  range of  motion.     Cervical back: Normal range of motion and neck supple. No edema, erythema or rigidity. No muscular tenderness. Normal range of motion.  Lymphadenopathy:     Head:     Right side of head: No submental or submandibular adenopathy.     Left side of head: No submental or submandibular adenopathy.     Cervical: No cervical adenopathy.  Skin:    General: Skin is warm and dry.     Coloration: Skin is not pale.     Findings: No rash.     Nails: There is no clubbing.  Neurological:     Mental Status: He is alert and oriented to person, place, and time.     Sensory: No sensory deficit.  Psychiatric:        Mood and Affect: Mood normal.        Speech: Speech normal.        Behavior: Behavior normal.    BP 138/80 (BP Location: Right Arm, Patient Position: Sitting, Cuff Size: Normal)    Pulse 64    Resp 16    Ht '6\' 4"'  (1.93 m)    Wt (!) 304 lb (137.9 kg)    SpO2 97%    BMI 37.00 kg/m  Wt Readings from Last 3 Encounters:  01/11/22 (!) 304 lb (137.9 kg)  09/28/21 (!) 306 lb 3.2 oz (138.9 kg)  06/15/21 289 lb 3.2 oz (131.2 kg)     Health Maintenance Due  Topic Date Due   TETANUS/TDAP  Never done   Fecal DNA (Cologuard)  Never done   Zoster Vaccines- Shingrix (1 of 2) Never done    There are no preventive care reminders to display for this patient.  Lab Results  Component Value Date   TSH 2.640 09/09/2020   Lab Results  Component Value Date   WBC 9.7 09/28/2021   HGB 12.3 (L) 09/28/2021   HCT 36.4 (L) 09/28/2021   MCV 89 09/28/2021   PLT 281 09/28/2021   Lab Results  Component Value Date   NA 143 09/28/2021   K 4.4 09/28/2021   CO2 19 (L) 09/28/2021   GLUCOSE 98 09/28/2021   BUN 13 09/28/2021   CREATININE 1.19 09/28/2021   BILITOT 0.6 09/28/2021   ALKPHOS 78 09/28/2021   AST 25 09/28/2021   ALT 18 09/28/2021   PROT 7.5 09/28/2021   ALBUMIN 4.4 09/28/2021   CALCIUM 8.9 09/28/2021   ANIONGAP 8 04/15/2021   EGFR 67 09/28/2021   Lab Results  Component  Value Date   CHOL 155 09/28/2021   Lab Results  Component Value Date   HDL 57 09/28/2021   Lab Results  Component Value Date   LDLCALC 80 09/28/2021   Lab Results  Component Value Date   TRIG 101 09/28/2021   Lab Results  Component Value Date   CHOLHDL 2.7 09/28/2021   Lab Results  Component Value Date   HGBA1C 4.8 09/07/2020      Assessment & Plan:   Problem List Items Addressed This Visit       Cardiovascular and Mediastinum   Benign hypertension    Hypertension stable at this time refill sent to the pharmacy of valsartan and amlodipine      Relevant Medications   amLODipine (NORVASC) 10 MG tablet   atorvastatin (LIPITOR) 40 MG tablet   aspirin 81 MG EC tablet   diltiazem (TIAZAC) 240 MG 24 hr capsule   valsartan (DIOVAN) 160 MG tablet  Coronary artery disease    History of coronary artery disease plan for this patient is to refill all medications these were sent to his pharmacy      Relevant Medications   amLODipine (NORVASC) 10 MG tablet   atorvastatin (LIPITOR) 40 MG tablet   aspirin 81 MG EC tablet   diltiazem (TIAZAC) 240 MG 24 hr capsule   valsartan (DIOVAN) 160 MG tablet     Respiratory   COPD with chronic bronchitis (HCC)    Stable on inhaled medications no changes made      Relevant Medications   albuterol (VENTOLIN HFA) 108 (90 Base) MCG/ACT inhaler     Other   Glaucoma    Follows with ophthalmology we will refill eyedrops      Relevant Medications   latanoprost (XALATAN) 0.005 % ophthalmic solution   Vitamin D deficiency    Patient will refill vitamin D      RESOLVED: Elevated serum creatinine    This has improved      RESOLVED: Homelessness    Currently has housing      Other Visit Diagnoses     Need for pneumococcal vaccination    -  Primary   Relevant Orders   Pneumococcal conjugate vaccine 20-valent (Completed)   Simple chronic bronchitis (HCC)       Relevant Medications   albuterol (VENTOLIN HFA) 108 (90 Base)  MCG/ACT inhaler       Meds ordered this encounter  Medications   albuterol (VENTOLIN HFA) 108 (90 Base) MCG/ACT inhaler    Sig: Inhale 1-2 puffs into the lungs every 6 (six) hours as needed for wheezing or shortness of breath.    Dispense:  90 g    Refill:  2   amLODipine (NORVASC) 10 MG tablet    Sig: Take 1 tablet (10 mg total) by mouth daily.    Dispense:  90 tablet    Refill:  2   atorvastatin (LIPITOR) 40 MG tablet    Sig: Take 1 tablet (40 mg total) by mouth daily.    Dispense:  90 tablet    Refill:  1    Disregard 32m lipitor Rx already sent   aspirin 81 MG EC tablet    Sig: Take 1 tablet (81 mg total) by mouth 5 (five) times daily. SWALLOW WHOLE.    Dispense:  30 tablet    Refill:  11   diltiazem (TIAZAC) 240 MG 24 hr capsule    Sig: Take 1 capsule (240 mg total) by mouth daily.    Dispense:  90 capsule    Refill:  2    Must keep upcoming office visit for refills   latanoprost (XALATAN) 0.005 % ophthalmic solution    Sig: Place 1 drop into both eyes nightly.    Dispense:  2.5 mL    Refill:  3   valsartan (DIOVAN) 160 MG tablet    Sig: Take 1 tablet (160 mg total) by mouth daily.    Dispense:  90 tablet    Refill:  3   Vitamin D, Ergocalciferol, (DRISDOL) 1.25 MG (50000 UNIT) CAPS capsule    Sig: Take 1 capsule (50,000 Units total) by mouth once a week.    Dispense:  12 capsule    Refill:  1    Follow-up: Return in about 4 months (around 05/13/2022).    PAsencion Noble MD

## 2022-01-11 ENCOUNTER — Other Ambulatory Visit: Payer: Self-pay

## 2022-01-11 ENCOUNTER — Ambulatory Visit: Payer: Medicare HMO | Attending: Critical Care Medicine | Admitting: Critical Care Medicine

## 2022-01-11 ENCOUNTER — Encounter: Payer: Self-pay | Admitting: Critical Care Medicine

## 2022-01-11 VITALS — BP 138/80 | HR 64 | Resp 16 | Ht 76.0 in | Wt 304.0 lb

## 2022-01-11 DIAGNOSIS — E559 Vitamin D deficiency, unspecified: Secondary | ICD-10-CM | POA: Diagnosis not present

## 2022-01-11 DIAGNOSIS — J449 Chronic obstructive pulmonary disease, unspecified: Secondary | ICD-10-CM | POA: Diagnosis not present

## 2022-01-11 DIAGNOSIS — J410 Simple chronic bronchitis: Secondary | ICD-10-CM | POA: Diagnosis not present

## 2022-01-11 DIAGNOSIS — Z23 Encounter for immunization: Secondary | ICD-10-CM

## 2022-01-11 DIAGNOSIS — I10 Essential (primary) hypertension: Secondary | ICD-10-CM | POA: Diagnosis not present

## 2022-01-11 DIAGNOSIS — R7989 Other specified abnormal findings of blood chemistry: Secondary | ICD-10-CM | POA: Diagnosis not present

## 2022-01-11 DIAGNOSIS — H4020X0 Unspecified primary angle-closure glaucoma, stage unspecified: Secondary | ICD-10-CM

## 2022-01-11 DIAGNOSIS — I2510 Atherosclerotic heart disease of native coronary artery without angina pectoris: Secondary | ICD-10-CM

## 2022-01-11 DIAGNOSIS — Z5900 Homelessness unspecified: Secondary | ICD-10-CM

## 2022-01-11 DIAGNOSIS — I2584 Coronary atherosclerosis due to calcified coronary lesion: Secondary | ICD-10-CM

## 2022-01-11 MED ORDER — ATORVASTATIN CALCIUM 40 MG PO TABS: 40.0000 mg | ORAL_TABLET | Freq: Every day | ORAL | 1 refills | Status: DC

## 2022-01-11 MED ORDER — ALBUTEROL SULFATE HFA 108 (90 BASE) MCG/ACT IN AERS: 1.0000 | INHALATION_SPRAY | Freq: Four times a day (QID) | RESPIRATORY_TRACT | 2 refills | Status: DC | PRN

## 2022-01-11 MED ORDER — DILTIAZEM HCL ER BEADS 240 MG PO CP24: 240.0000 mg | ORAL_CAPSULE | Freq: Every day | ORAL | 2 refills | Status: DC

## 2022-01-11 MED ORDER — VALSARTAN 160 MG PO TABS: 160.0000 mg | ORAL_TABLET | Freq: Every day | ORAL | 3 refills | Status: DC

## 2022-01-11 MED ORDER — ASPIRIN 81 MG PO TBEC: 81.0000 mg | DELAYED_RELEASE_TABLET | Freq: Every day | ORAL | 11 refills | Status: DC

## 2022-01-11 MED ORDER — AMLODIPINE BESYLATE 10 MG PO TABS: 10.0000 mg | ORAL_TABLET | Freq: Every day | ORAL | 2 refills | Status: DC

## 2022-01-11 MED ORDER — LATANOPROST 0.005 % OP SOLN: OPHTHALMIC | 3 refills | Status: DC

## 2022-01-11 MED ORDER — VITAMIN D (ERGOCALCIFEROL) 1.25 MG (50000 UNIT) PO CAPS: 50000.0000 [IU] | ORAL_CAPSULE | ORAL | 1 refills | Status: DC

## 2022-01-11 NOTE — Assessment & Plan Note (Signed)
Currently has housing ?

## 2022-01-11 NOTE — Assessment & Plan Note (Signed)
Stable on inhaled medications no changes made ?

## 2022-01-11 NOTE — Assessment & Plan Note (Signed)
Follows with ophthalmology we will refill eyedrops ?

## 2022-01-11 NOTE — Assessment & Plan Note (Signed)
Patient will refill vitamin D ?

## 2022-01-11 NOTE — Assessment & Plan Note (Signed)
History of coronary artery disease plan for this patient is to refill all medications these were sent to his pharmacy ?

## 2022-01-11 NOTE — Assessment & Plan Note (Signed)
Hypertension stable at this time refill sent to the pharmacy of valsartan and amlodipine ?

## 2022-01-11 NOTE — Patient Instructions (Signed)
[facility] [address] ? ?Prevnar 20 pneumonia vaccine was given ? ?Please get your Cologuard kit processed and sent in ? ?Return to see Dr. Joya Gaskins 4 months ?

## 2022-01-11 NOTE — Assessment & Plan Note (Signed)
This has improved.

## 2022-01-14 ENCOUNTER — Other Ambulatory Visit: Payer: Self-pay

## 2022-01-14 ENCOUNTER — Other Ambulatory Visit: Payer: Self-pay | Admitting: *Deleted

## 2022-01-14 ENCOUNTER — Encounter: Payer: Self-pay | Admitting: *Deleted

## 2022-01-14 ENCOUNTER — Telehealth: Payer: Self-pay | Admitting: *Deleted

## 2022-01-14 NOTE — Patient Outreach (Signed)
[facility]) Care Management ?Telephonic RN Care Manager Note ? ? ?01/14/2022 ?Name:  Robert Chan MRN:  [id number] DOB:  10-Aug-1956 ? ?Summary: ?Follow up outreach ? ?Cardiac Robert Chan today reports some pain in chest and sweating He plans to take Tums  ?He was speaking with one of his sisters prior to RN CM outreach and they discussed the passing of recent family members with myocardial infarction (MI)/ cardiac diagnoses  ?He reports he has had these symptoms before, He is not on GERD medicines, He believes the sweating on his head is related to the temperature of his apartment ?He has not checked his BP today but reports it was good during his pcp office visit on 01/11/22 ?He does have a BP cuff in the home but reports it does not work well. He and RN CM have discussed him obtaining a new one various outreaches prior. ?He agrees to outreach to EMS if symptoms increase or continue ?After a 20+ minute outreach with him plus a call to his pcp office he reports the symptoms have decreased ? ?Medicines  Robert Chan picked up new medicines from his pharmacy today that were ordered during his 01/11/22 pcp visit ?Tiazac 240 mg ER is included  ?He and RN CM discussed the purpose of Tiazac  ?He inquired about heart stents  and cardiac cath procedures  ?He confirms his next cardiology visit is on 01/19/22  ?Robert Chan & RN CM Questioned the new 01/11/22 order for ASA 81 mg Take 1 tablet (81 mg total) by mouth 5 (five) times daily. Swallow Whole ? ?He agrees to outreach to RN CM if no return call related to his aspirin ? ?Recommendations/Changes made from today's visit: ?Assessed for worsening symptoms ?Reviewed CAD action plan and MI symptoms ?Explained to him the 3 different purposes of his HTN medications ?Discussed heart stents and cardiac caths- answered questions ?Agreed with pt to get clarity on new ASA dosage order from 01/11/22 ?Conference call with patient to [facility] [facility] [phone] to speak  with RN to discuss the ASA 81 mg tablet by mouth 5 times daily order Spoke with Rip Harbour who will check with pcp and update patient ?Discussed THN progression and next outreach  ?Encouraged him to outreach if no return call related to his aspirin ? ?Subjective: ?Robert Chan is an 66 y.o. year old male who is a primary patient of Joya Gaskins Burnett Harry, MD. The care management team was consulted for assistance with care management and/or care coordination needs.   ? ?Telephonic RN Care Manager completed Telephone Visit today.  ? ?Objective: ? ?Medications Reviewed Today   ? ? Reviewed by Elsie Stain, MD (Physician) on 01/11/22 at 1323  Med List Status: <None>  ? ?Medication Order Taking? Sig Documenting Provider Last Dose Status Informant  ?albuterol (VENTOLIN HFA) 108 (90 Base) MCG/ACT inhaler [id number]  Inhale 1-2 puffs into the lungs every 6 (six) hours as needed for wheezing or shortness of breath. Elsie Stain, MD  Active   ?amLODipine (NORVASC) 10 MG tablet [id number]  Take 1 tablet (10 mg total) by mouth daily. Elsie Stain, MD  Active   ?aspirin 81 MG EC tablet [id number]  Take 1 tablet (81 mg total) by mouth 5 (five) times daily. SWALLOW WHOLE. Elsie Stain, MD  Active   ?atorvastatin (LIPITOR) 40 MG tablet [id number]  Take 1 tablet (40 mg total) by mouth daily. Elsie Stain, MD  Active   ?  diltiazem (TIAZAC) 240 MG 24 hr capsule [id number]  Take 1 capsule (240 mg total) by mouth daily. Elsie Stain, MD  Active   ?latanoprost (XALATAN) 0.005 % ophthalmic solution [id number]  Place 1 drop into both eyes nightly. Elsie Stain, MD  Active   ?sulfamethoxazole-trimethoprim (BACTRIM DS) 800-160 MG tablet [id number] Yes Take 1 tablet by mouth 2 (two) times daily. [provider] Taking Active   ?SYMBICORT 160-4.5 MCG/ACT inhaler [id number] Yes Inhale 2 puffs into the lungs in the morning and at bedtime. Elsie Stain, MD Taking Active   ?valsartan (DIOVAN)  160 MG tablet [id number]  Take 1 tablet (160 mg total) by mouth daily. Elsie Stain, MD  Active   ?Vitamin D, Ergocalciferol, (DRISDOL) 1.25 MG (50000 UNIT) CAPS capsule [id number]  Take 1 capsule (50,000 Units total) by mouth once a week. Elsie Stain, MD  Active   ? ?  ?  ? ?  ? ? ? ?SDOH:  (Social Determinants of Health) assessments and interventions performed:  ?SDOH Interventions   ? ?Flowsheet Row Most Recent Value  ?SDOH Interventions   ?Financial Strain Interventions Intervention Not Indicated  ?Housing Interventions Intervention Not Indicated  ?Intimate Partner Violence Interventions Intervention Not Indicated  ?Social Connections Interventions Intervention Not Indicated  ?Transportation Interventions Intervention Not Indicated  ? ?  ? ? ?Care Plan ? ?Review of patient past medical history, allergies, medications, health status, including review of consultants reports, laboratory and other test data, was performed as part of comprehensive evaluation for care management services.  ? ?Care Plan : RN CM plan of care  ?Updates made by Barbaraann Faster, RN since 01/14/2022 12:00 AM  ?  ? ?Problem: Complex Care Coordination Needs and disease management in patient with HTN   ?Priority: High  ?  ? ?Long-Range Goal: Establish Plan of Care for Management Complex SDOH Barriers, disease management and Care Coordination Needs in patient with HTN   ?Start Date: 09/07/2021  ?This Visit's Progress: On track  ?Recent Progress: On track  ?Priority: High  ?Note:   ?Current Barriers:  ?Knowledge Deficits related to plan of care for management of CAD, HTN, and atopic dermatitis  ?Care Coordination needs related to Limited education about CAD, HTN, atopic dermatitis* ?Barriers, health behaviors, knowledge, some difficulty filling out medical forms ?12/09/21 interest in starting mail order services vis [employer], [facility]- denies need for [facility] at this time, remains with same podiatrist &  dermatologist ?12/18/21 he confirms the BP monitor he obtained is not "the right one" and he will need to purchase another or exchange it with his [employer] benefit, but his home health RN has taken BP  ? ?RN CM Clinical Goal(s):  ?Patient will verbalize understanding of plan for management of CAD, HTN, and atopic dermatitis as evidenced by improvement in BP values and healing of skin   through collaboration with RN Care manager, provider, and care team.  ? ?Interventions: ?Review of EPIC chart, appointments follow up outreaches for worsening symptoms, care coordination needs and disease management/education needs ?Inter-disciplinary care team collaboration (see longitudinal plan of care) ?Evaluation of current treatment plan related to  self management and patient's adherence to plan as established by provider ?11/05/21 various in network dermatologists for his insurance plan provided for him to outreach to. Encouraged him to review his [employer] benefits for OTC, transportation , dental, fitness, vision, etc for 2023  ?12/09/21 various calls to [place], pcp/pcp pharmacy related to medication mail order, diltiazem ?12/18/21 Assessed  for worsening symptoms ?Followed up on medicine concerns ?Completed medication adherence assessment  ?Reviewed the purpose of valsartan (treat high blood pressure, heart failure, and diabetic kidney disease) and diltiazem ( treat high blood pressure, angina, and certain heart arrhythmias) to include differences and importance related to his 2017 dx HTN  & CAD ?Reviewed importance of checking BP at home  ?Encouraged lifestyle changes to assist with possible discontinuation of medication with assist of MD ?Allowed him to ventilate his feelings  ?With review of EmmiManager noted Robert Lowdermilk is not reviewing the EMMI education e-mailed to him on HTN and CAD + ? ?Hypertension Interventions: Status On track short term goal 01/14/22 Still needs new BP cuff BO WNL at recent PCP visit ?Last practice recorded BP  readings:  ?BP Readings from Last 3 Encounters:  ?01/11/22 138/80  ?12/18/21 131/75  ?09/28/21 139/89  ?Most recent eGFR/CrCl:  ?Lab Results  ?Component Value Date  ? EGFR 67 09/28/2021  ?  No components found for: CR

## 2022-01-14 NOTE — Telephone Encounter (Signed)
Pt and nurse from [facility] noted that new rx for asa '81mg'$  now states to take 5 a day. Pt is going to continue with usual dose of 1 a day until he hears back from office. [phone]. ?

## 2022-01-15 ENCOUNTER — Telehealth: Payer: Self-pay

## 2022-01-15 DIAGNOSIS — I872 Venous insufficiency (chronic) (peripheral): Secondary | ICD-10-CM | POA: Diagnosis not present

## 2022-01-15 DIAGNOSIS — L84 Corns and callosities: Secondary | ICD-10-CM | POA: Diagnosis not present

## 2022-01-15 DIAGNOSIS — L301 Dyshidrosis [pompholyx]: Secondary | ICD-10-CM | POA: Diagnosis not present

## 2022-01-15 MED ORDER — ASPIRIN 81 MG PO TBEC: 81.0000 mg | DELAYED_RELEASE_TABLET | Freq: Every day | ORAL | 6 refills | Status: DC

## 2022-01-15 NOTE — Telephone Encounter (Signed)
-----   Message from Sueanne Margarita, MD sent at 01/15/2022  8:54 AM EDT ----- ?This patient needs to go to the ER for evaulation due to his CP ?----- Message ----- ?From: Barbaraann Faster, RN ?Sent: 01/14/2022   5:24 PM EDT ?To: Sueanne Margarita, MD, Elsie Stain, MD ? ? ?

## 2022-01-15 NOTE — Telephone Encounter (Signed)
Left message for patient to call back  

## 2022-01-15 NOTE — Addendum Note (Signed)
Addended byCharlott Rakes on: 01/15/2022 09:03 AM ? ? Modules accepted: Orders ? ?

## 2022-01-15 NOTE — Telephone Encounter (Signed)
He should be taking aspirin 1 tablet daily.  I have sent a new prescription to his pharmacy. ?

## 2022-01-18 ENCOUNTER — Ambulatory Visit: Payer: Medicare HMO | Admitting: Podiatry

## 2022-01-18 ENCOUNTER — Encounter: Payer: Self-pay | Admitting: Podiatry

## 2022-01-18 ENCOUNTER — Ambulatory Visit (INDEPENDENT_AMBULATORY_CARE_PROVIDER_SITE_OTHER): Payer: Medicare HMO | Admitting: Podiatry

## 2022-01-18 ENCOUNTER — Other Ambulatory Visit: Payer: Self-pay

## 2022-01-18 DIAGNOSIS — B351 Tinea unguium: Secondary | ICD-10-CM | POA: Diagnosis not present

## 2022-01-18 DIAGNOSIS — M79674 Pain in right toe(s): Secondary | ICD-10-CM

## 2022-01-18 DIAGNOSIS — M79675 Pain in left toe(s): Secondary | ICD-10-CM | POA: Diagnosis not present

## 2022-01-18 DIAGNOSIS — L989 Disorder of the skin and subcutaneous tissue, unspecified: Secondary | ICD-10-CM | POA: Diagnosis not present

## 2022-01-18 DIAGNOSIS — I739 Peripheral vascular disease, unspecified: Secondary | ICD-10-CM

## 2022-01-18 NOTE — Progress Notes (Signed)
This patient returns to my office for at risk foot care.  This patient requires this care by a professional since this patient will be at risk due to having PAD and CKD..    This patient is unable to cut nails himself since the patient cannot reach his nails.These nails are painful walking and wearing shoes. Patient also has painful callus on the outside on bottom of both feet. This patient presents for at risk foot care today. ? ?General Appearance  Alert, conversant and in no acute stress. ? ?Vascular  Dorsalis pedis and posterior tibial  pulses are palpable  bilaterally.  Capillary return is within normal limits  bilaterally. Temperature is within normal limits  bilaterally. ? ?Neurologic  Senn-Weinstein monofilament wire test within normal limits  bilaterally. Muscle power within normal limits bilaterally. ? ?Nails Thick disfigured discolored nails with subungual debris  from hallux to fifth toes bilaterally. No evidence of bacterial infection or drainage bilaterally. ? ?Orthopedic  No limitations of motion  feet .  No crepitus or effusions noted.  No bony pathology or digital deformities noted. Plantar flexed fifth metatarsals  B/L. ? ?Skin  normotropic skin with no porokeratosis noted bilaterally.  No signs of infections or ulcers noted.   Callus sub 5th met both feet. ? ?Onychomycosis  Pain in right toes  Pain in left toes  Callus  B/L. ? ?Consent was obtained for treatment procedures.   Mechanical debridement of nails 1-5  bilaterally performed with a nail nipper.  Filed with dremel without incident. Debride callus with # 15 blade. ? ? ?Return office visit   3 months                     Told patient to return for periodic foot care and evaluation due to potential at risk complications. ? ? ?Gardiner Barefoot DPM   ?

## 2022-01-19 ENCOUNTER — Ambulatory Visit (INDEPENDENT_AMBULATORY_CARE_PROVIDER_SITE_OTHER): Payer: Medicare HMO | Admitting: Cardiology

## 2022-01-19 ENCOUNTER — Encounter: Payer: Self-pay | Admitting: Cardiology

## 2022-01-19 VITALS — BP 131/84 | HR 74 | Ht 76.0 in | Wt 300.5 lb

## 2022-01-19 DIAGNOSIS — E785 Hyperlipidemia, unspecified: Secondary | ICD-10-CM | POA: Diagnosis not present

## 2022-01-19 DIAGNOSIS — I2510 Atherosclerotic heart disease of native coronary artery without angina pectoris: Secondary | ICD-10-CM

## 2022-01-19 DIAGNOSIS — R072 Precordial pain: Secondary | ICD-10-CM | POA: Diagnosis not present

## 2022-01-19 DIAGNOSIS — I10 Essential (primary) hypertension: Secondary | ICD-10-CM

## 2022-01-19 DIAGNOSIS — R079 Chest pain, unspecified: Secondary | ICD-10-CM

## 2022-01-19 LAB — CBC
Hematocrit: 36.9 % — ABNORMAL LOW (ref 37.5–51.0)
Hemoglobin: 12.7 g/dL — ABNORMAL LOW (ref 13.0–17.7)
MCH: 31 pg (ref 26.6–33.0)
MCHC: 34.4 g/dL (ref 31.5–35.7)
MCV: 90 fL (ref 79–97)
Platelets: 257 10*3/uL (ref 150–450)
RBC: 4.1 x10E6/uL — ABNORMAL LOW (ref 4.14–5.80)
RDW: 12 % (ref 11.6–15.4)
WBC: 9.3 10*3/uL (ref 3.4–10.8)

## 2022-01-19 LAB — BASIC METABOLIC PANEL
BUN/Creatinine Ratio: 10 (ref 10–24)
BUN: 12 mg/dL (ref 8–27)
CO2: 21 mmol/L (ref 20–29)
Calcium: 9.2 mg/dL (ref 8.6–10.2)
Chloride: 105 mmol/L (ref 96–106)
Creatinine, Ser: 1.15 mg/dL (ref 0.76–1.27)
Glucose: 103 mg/dL — ABNORMAL HIGH (ref 70–99)
Potassium: 4.5 mmol/L (ref 3.5–5.2)
Sodium: 138 mmol/L (ref 134–144)
eGFR: 70 mL/min/{1.73_m2} (ref >59)

## 2022-01-19 MED ORDER — METOPROLOL TARTRATE 100 MG PO TABS: ORAL_TABLET | ORAL | 0 refills | Status: DC

## 2022-01-19 NOTE — Telephone Encounter (Signed)
Patient was seen by Kathyrn Drown, NP today, 3/21 ?

## 2022-01-19 NOTE — Patient Instructions (Addendum)
Medication Instructions:  ?Your physician recommends that you continue on your current medications as directed. Please refer to the Current Medication list given to you today. ? ?*If you need a refill on your cardiac medications before your next appointment, please call your pharmacy* ? ? ?Lab Work: ?TODAY: BMP, CBC ?If you have labs (blood work) drawn today and your tests are completely normal, you will receive your results only by: ?MyChart Message (if you have MyChart) OR ?A paper copy in the mail ?If you have any lab test that is abnormal or we need to change your treatment, we will call you to review the results. ? ? ?Testing/Procedures: ?Your physician has requested that you have an echocardiogram. Echocardiography is a painless test that uses sound waves to create images of your heart. It provides your doctor with information about the size and shape of your heart and how well your heart?s chambers and valves are working. This procedure takes approximately one hour. There are no restrictions for this procedure. ? ?Your physician has requested that you have cardiac CT. Cardiac computed tomography (CT) is a painless test that uses an x-ray machine to take clear, detailed pictures of your heart. For further information please visit [email or web address].[email or web address]. Please follow instruction sheet as given. ?  ? ? ?Follow-Up: ?At [facility], you and your health needs are our priority.  As part of our continuing mission to provide you with exceptional heart care, we have created designated Provider Care Teams.  These Care Teams include your primary Cardiologist (physician) and Advanced Practice Providers (APPs -  Physician Assistants and Nurse Practitioners) who all work together to provide you with the care you need, when you need it. ? ?We recommend signing up for the patient portal called "MyChart".  Sign up information is provided on this After Visit Summary.  MyChart is used to connect with patients for Virtual Visits  (Telemedicine).  Patients are able to view lab/test results, encounter notes, upcoming appointments, etc.  Non-urgent messages can be sent to your provider as well.   ?To learn more about what you can do with MyChart, go to [email or web address].   ? ?Your next appointment:   ?4 -6 week(s) ? ?The format for your next appointment:   ?In Person ? ?Provider:   ?[name], MD  or Melina Copa, PA-C, Ermalinda Barrios, PA-C, or Kathyrn Drown       { ? ? ?Other Instructions ? ? ? ?Your cardiac CT will be scheduled at the below location:  ? ?[facility] ?[address] ?[place], [place] [address] ?[phone] ? ? ?At [facility], please arrive at the [facility] and Children's Entrance (Entrance C2) of [facility] 30 minutes prior to test start time. ?You can use the FREE valet parking offered at entrance C (encouraged to control the heart rate for the test)  ?Proceed to the [facility] Radiology Department (first floor) to check-in and test prep. ? ?All radiology patients and guests should use entrance C2 at [facility], accessed from [facility], even though the hospital's physical address listed is [address]. ? ? ? ? ?Please follow these instructions carefully (unless otherwise directed): ? ?Hold all erectile dysfunction medications at least 3 days (72 hrs) prior to test. ? ?On the Night Before the Test: ?Be sure to Drink plenty of water. ?Do not consume any caffeinated/decaffeinated beverages or chocolate 12 hours prior to your test. ?Do not take any antihistamines 12 hours prior to your  test. ? ? ?On the Day of the Test: ?Drink plenty of water until 1 hour prior to the test. ?Do not eat any food 4 hours prior to the test. ?You may take your regular medications prior to the test.  ?Take metoprolol (Lopressor) 100 mg two hours prior to test. ? ? ?     ?After the Test: ?Drink plenty of water. ?After receiving IV contrast, you may experience a mild flushed feeling.  This is normal. ?On occasion, you may experience a mild rash up to 24 hours after the test. This is not dangerous. If this occurs, you can take Benadryl 25 mg and increase your fluid intake. ?If you experience trouble breathing, this can be serious. If it is severe call 911 IMMEDIATELY. If it is mild, please call our office. ?If you take any of these medications: Glipizide/Metformin, Avandament, Glucavance, please do not take 48 hours after completing test unless otherwise instructed. ? ?We will call to schedule your test 2-4 weeks out understanding that some insurance companies will need an authorization prior to the service being performed.  ? ?For non-scheduling related questions, please contact the cardiac imaging nurse navigator should you have any questions/concerns: ?Marchia Bond, Cardiac Imaging Nurse Navigator ?Gordy Clement, Cardiac Imaging Nurse Navigator ?[facility] ?Direct Office Dial: [phone]  ? ?For scheduling needs, including cancellations and rescheduling, please call [place], [phone].   ?

## 2022-01-21 ENCOUNTER — Ambulatory Visit (HOSPITAL_COMMUNITY)
Admission: RE | Admit: 2022-01-21 | Discharge: 2022-01-21 | Disposition: A | Discharge status: Home / Self Care | Payer: Medicare HMO | Source: Ambulatory Visit | Attending: Cardiology | Admitting: Cardiology

## 2022-01-21 ENCOUNTER — Other Ambulatory Visit: Payer: Self-pay

## 2022-01-21 DIAGNOSIS — R072 Precordial pain: Secondary | ICD-10-CM | POA: Insufficient documentation

## 2022-01-21 DIAGNOSIS — I10 Essential (primary) hypertension: Secondary | ICD-10-CM | POA: Insufficient documentation

## 2022-01-21 DIAGNOSIS — J449 Chronic obstructive pulmonary disease, unspecified: Secondary | ICD-10-CM | POA: Diagnosis not present

## 2022-01-21 DIAGNOSIS — I2510 Atherosclerotic heart disease of native coronary artery without angina pectoris: Secondary | ICD-10-CM | POA: Diagnosis not present

## 2022-01-21 DIAGNOSIS — I739 Peripheral vascular disease, unspecified: Secondary | ICD-10-CM | POA: Diagnosis not present

## 2022-01-22 LAB — ECHOCARDIOGRAM COMPLETE
Area-P 1/2: 5.31 cm2
Calc EF: 54.7 %
S' Lateral: 3.8 cm
Single Plane A2C EF: 57 %
Single Plane A4C EF: 54.9 %

## 2022-02-01 ENCOUNTER — Other Ambulatory Visit (HOSPITAL_COMMUNITY): Payer: Self-pay | Admitting: *Deleted

## 2022-02-01 ENCOUNTER — Telehealth (HOSPITAL_COMMUNITY): Payer: Self-pay | Admitting: *Deleted

## 2022-02-01 MED ORDER — METOPROLOL TARTRATE 100 MG PO TABS: ORAL_TABLET | ORAL | 0 refills | Status: DC

## 2022-02-01 NOTE — Telephone Encounter (Signed)
Reaching out to patient to offer assistance regarding upcoming cardiac imaging study; pt verbalizes understanding of appt date/time, parking situation and where to check in, pre-test NPO status and medications ordered, and verified current allergies; name and call back number provided for further questions should they arise  Shanasia Ibrahim RN Navigator Cardiac Imaging [facility] [phone] office [phone] cell  Patient to take 100mg metoprolol tartrate two hours prior to his cardiac CT scan. He is aware to arrive at 10:30am. 

## 2022-02-02 ENCOUNTER — Ambulatory Visit (HOSPITAL_COMMUNITY)
Admission: RE | Admit: 2022-02-02 | Discharge: 2022-02-02 | Disposition: A | Discharge status: Home / Self Care | Payer: Medicare HMO | Source: Ambulatory Visit | Attending: Cardiology | Admitting: Cardiology

## 2022-02-02 DIAGNOSIS — R072 Precordial pain: Secondary | ICD-10-CM | POA: Insufficient documentation

## 2022-02-02 MED ORDER — IOHEXOL 350 MG/ML SOLN
100.0000 mL | Freq: Once | INTRAVENOUS | Status: AC | PRN
  Administered 2022-02-02: 100 mL via INTRAVENOUS

## 2022-02-02 MED ORDER — NITROGLYCERIN 0.4 MG SL SUBL
0.8000 mg | SUBLINGUAL_TABLET | Freq: Once | SUBLINGUAL | Status: AC
Start: 2022-02-02 — End: 2022-02-02
  Administered 2022-02-02: 0.8 mg via SUBLINGUAL

## 2022-02-02 MED ORDER — NITROGLYCERIN 0.4 MG SL SUBL
SUBLINGUAL_TABLET | SUBLINGUAL | Status: AC
  Filled 2022-02-02: qty 2

## 2022-02-09 ENCOUNTER — Ambulatory Visit: Payer: Medicare HMO | Admitting: Podiatry

## 2022-02-11 ENCOUNTER — Other Ambulatory Visit: Payer: Self-pay | Admitting: *Deleted

## 2022-02-11 ENCOUNTER — Encounter: Payer: Self-pay | Admitting: *Deleted

## 2022-02-11 NOTE — Patient Outreach (Signed)
[facility]) Care Management ?Telephonic RN Care Manager Note ? ? ?02/16/2022 ?Name:  Robert Chan MRN:  [id number] DOB:  15-Apr-1956 ? ?Summary: ?Follow up outreach to further assess and provided disease management education ?Robert Chan reports he is doing well ? ?He reports difficulty in accurately completing a colon screening home test.  He was not able to connect with RN CM for a video visit with an attempt to guide/direct him on the use of the colon screen ? ?New Bunion is reported  ?Robert Chan reports minimal relief from soaking in epsom salt ?He likes to walk and would prefer to have this resolved ? ?Confirmed he was evaluated for chest pain  ?Reports he is pending follow up  ?He reports dietary changes to include an increase in fruits, vegetables and less red meats ? ?Recommendations/Changes made from today's visit: ?Assessed for any changes or needs for care coordination, disease management ?Attempted to assist with providing directions to him for his colon screen he received at home. Unable to connect with Robert Chan via a video visit  ?Good feet store services discussed  ?Allowed him time to ventilate his feelings- encouraged him to continue to express himself ?Commended him for his reported dietary changes ?Discussed plant based foods to prepare at home  ?Lots of encouragement provided  ? ? ? ?Subjective: ?Robert Chan is an 66 y.o. year old male who is a primary patient of Joya Gaskins Burnett Harry, MD. The care management team was consulted for assistance with care management and/or care coordination needs.   ? ?Telephonic RN Care Manager completed Telephone Visit today.  ? ?Objective: ? ?Medications Reviewed Today   ? ? Reviewed by Marzetta Board, DPM (Physician) on 02/12/22 at 0724  Med List Status: <None>  ? ?Medication Order Taking? Sig Documenting Provider Last Dose Status Informant  ?albuterol (VENTOLIN HFA) 108 (90 Base) MCG/ACT inhaler [id number] No Inhale 1-2 puffs into the  lungs every 6 (six) hours as needed for wheezing or shortness of breath. Elsie Stain, MD Taking Active   ?amLODipine (NORVASC) 10 MG tablet [id number] No Take 1 tablet (10 mg total) by mouth daily. Elsie Stain, MD Taking Active   ?aspirin 81 MG EC tablet [id number] No Take 1 tablet (81 mg total) by mouth daily. Charlott Rakes, MD Taking Active   ?atorvastatin (LIPITOR) 40 MG tablet [id number] No Take 1 tablet (40 mg total) by mouth daily. Elsie Stain, MD Taking Active   ?diltiazem [facility]) 240 MG 24 hr capsule [id number] No Take 1 capsule (240 mg total) by mouth daily. Elsie Stain, MD Taking Active   ?latanoprost (XALATAN) 0.005 % ophthalmic solution [id number] No Place 1 drop into both eyes nightly. Elsie Stain, MD Taking Active   ?metoprolol tartrate (LOPRESSOR) 100 MG tablet [id number]  Take 2 hours prior to Cardiac CT Tommie Cris, NP  Active   ?sulfamethoxazole-trimethoprim (BACTRIM DS) 800-160 MG tablet [id number] No Take 1 tablet by mouth 2 (two) times daily. [provider] Taking Active   ?SYMBICORT 160-4.5 MCG/ACT inhaler [id number] No Inhale 2 puffs into the lungs in the morning and at bedtime. Elsie Stain, MD Taking Active   ?valsartan (DIOVAN) 160 MG tablet [id number] No Take 1 tablet (160 mg total) by mouth daily. Elsie Stain, MD Taking Active   ?Vitamin D, Ergocalciferol, (DRISDOL) 1.25 MG (50000 UNIT) CAPS capsule [id number] No Take 1 capsule (50,000 Units total) by mouth once a week. Elsie Stain, MD Taking Active   ? ?  ?  ? ?  ? ? ? ?  SDOH:  (Social Determinants of Health) assessments and interventions performed:  ?SDOH Interventions   ? ?Flowsheet Row Most Recent Value  ?SDOH Interventions   ?Financial Strain Interventions Intervention Not Indicated  ?Housing Interventions Intervention Not Indicated  ?Stress Interventions Intervention Not Indicated  ?Transportation Interventions Intervention Not Indicated  ? ?  ? ? ?Care Plan ? ?Review of patient  past medical history, allergies, medications, health status, including review of consultants reports, laboratory and other test data, was performed as part of comprehensive evaluation for care management services.  ? ?Care Plan : RN CM plan of care  ?Updates made by Barbaraann Faster, RN since 02/16/2022 12:00 AM  ?  ? ?Problem: Complex Care Coordination Needs and disease management in patient with HTN   ?Priority: High  ?  ? ?Long-Range Goal: Establish Plan of Care for Management Complex SDOH Barriers, disease management and Care Coordination Needs in patient with HTN   ?Start Date: 09/07/2021  ?This Visit's Progress: On track  ?Recent Progress: On track  ?Priority: High  ?Note:   ?Current Barriers:  ?Knowledge Deficits related to plan of care for management of CAD, HTN, and atopic dermatitis  ?Care Coordination needs related to Limited education about CAD, HTN, atopic dermatitis* ?Barriers, health behaviors, knowledge, some difficulty filling out medical forms ?12/09/21 interest in starting mail order services vis [employer], diltiazem fill discrepancy via [facility]- denies need for PCS at this time, remains with same podiatrist & dermatologist ?12/18/21 he confirms the BP monitor he obtained is not "the right one" and he will need to purchase another or exchange it with his [employer] benefit, but his home health RN has taken BP  ? ?RN CM Clinical Goal(s):  ?Patient will verbalize understanding of plan for management of CAD, HTN, and atopic dermatitis as evidenced by improvement in BP values and healing of skin   through collaboration with RN Care manager, provider, and care team.  ? ?Interventions: ?Review of EPIC chart, appointments follow up outreaches for worsening symptoms, care coordination needs and disease management/education needs ?Inter-disciplinary care team collaboration (see longitudinal plan of care) ?Evaluation of current treatment plan related to  self management and patient's adherence to plan as established by  provider ?11/05/21 various in network dermatologists for his insurance plan provided for him to outreach to. Encouraged him to review his [employer] benefits for OTC, transportation , dental, fitness, vision, etc for 2023  ?12/09/21 various calls to [place], pcp/pcp pharmacy related to medication mail order, diltiazem ?12/18/21 Assessed for worsening symptoms ?Followed up on medicine concerns ?Completed medication adherence assessment  ?Reviewed the purpose of valsartan (treat high blood pressure, heart failure, and diabetic kidney disease) and diltiazem ( treat high blood pressure, angina, and certain heart arrhythmias) to include differences and importance related to his 2017 dx HTN  & CAD ?Reviewed importance of checking BP at home  ?Encouraged lifestyle changes to assist with possible discontinuation of medication with assist of MD ?Allowed him to ventilate his feelings  ?With review of EmmiManager noted Robert Cederberg is not reviewing the EMMI education e-mailed to him on HTN and CAD + ? ?Hypertension Interventions: Status On track short term goal 01/14/22 Still needs new BP cuff BP elevations continue ?Last practice recorded BP readings:  ?BP Readings from Last 3 Encounters:  ?02/02/22 (!) 147/100  ?01/19/22 131/84  ?01/11/22 138/80  ?Most recent eGFR/CrCl:  ?Lab Results  ?Component Value Date  ? EGFR 70 01/19/2022  ?  No components found for: CRCL ? ?Evaluation of current treatment plan  related to hypertension self management and patient's adherence to plan as established by provider; ?Reviewed medications with patient and discussed importance of compliance; ?Provided assistance with obtaining home blood pressure monitor via his [employer] OTC benefit catalog;  ?Counseled on the importance of exercise goals with target of 150 minutes per week ?Discussed plans with patient for ongoing care management follow up and provided patient with direct contact information for care management team; ?Reviewed scheduled/upcoming provider  appointments including:  ?Assessed social determinant of health barriers;  ?10/07/21 outreach to dermatology office to speak with staff about his ineffective present treatment -sent patient a list of other in netw

## 2022-02-12 ENCOUNTER — Ambulatory Visit (INDEPENDENT_AMBULATORY_CARE_PROVIDER_SITE_OTHER): Payer: Medicare HMO | Admitting: Podiatry

## 2022-02-12 ENCOUNTER — Encounter: Payer: Self-pay | Admitting: Podiatry

## 2022-02-12 DIAGNOSIS — M79675 Pain in left toe(s): Secondary | ICD-10-CM | POA: Diagnosis not present

## 2022-02-12 DIAGNOSIS — B351 Tinea unguium: Secondary | ICD-10-CM

## 2022-02-12 DIAGNOSIS — M79674 Pain in right toe(s): Secondary | ICD-10-CM

## 2022-02-12 DIAGNOSIS — I739 Peripheral vascular disease, unspecified: Secondary | ICD-10-CM

## 2022-02-12 NOTE — Patient Instructions (Signed)
Recommend [employer]chers Loafers with stretchable uppers and memory foam insoles. They can be purchased at [employer], [employer] or [employer]. Also on [email or web address].  

## 2022-02-18 ENCOUNTER — Telehealth: Payer: Self-pay | Admitting: Cardiology

## 2022-02-18 NOTE — Telephone Encounter (Signed)
Patient is calling to receive his test results. Please call back ?

## 2022-02-18 NOTE — Telephone Encounter (Signed)
Please let the patient know that his echocardiogram shows normal LVEF with mild hypokinesis of the mid-inferoseptal and inferior walls. I am reassured by the coronary CTA that showed mild, non-obstructive CAD. No changes at this time. Continue ASA and Lipitor for secondary risk reduction.  ?

## 2022-02-18 NOTE — Telephone Encounter (Signed)
Left message for patient to call back  

## 2022-02-18 NOTE — Telephone Encounter (Signed)
Patient calling in regards to his CTA and echocardiogram results. Advised patient that results have not been reviewed by Sharee Pimple but I will send her a message and we will call back with the results. Patient verbalized understanding.  ?

## 2022-02-21 NOTE — Progress Notes (Signed)
?  Subjective:  ?Patient ID: Robert Chan, male    DOB: 05-Mar-1956,  MRN: [id number] ? ?Robert Chan presents to clinic today for at risk foot care. Patient has history of PAD and CKD and painful porokeratotic lesion(s) b/l LE and painful mycotic toenails that limit ambulation. Painful toenails interfere with ambulation. Aggravating factors include wearing enclosed shoe gear. Pain is relieved with periodic professional debridement. Painful porokeratotic lesions are aggravated when weightbearing with and without shoegear. Pain is relieved with periodic professional debridement. ? ?New problem(s): None.  ? ?PCP is Elsie Stain, MD , and last visit was January 11, 2022. ? ?Allergies  ?Allergen Reactions  ? Shellfish Allergy Hives and Rash  ? ? ?Review of Systems: Negative except as noted in the HPI. ? ?Objective: ?Objective:  ? ?Vascular Examination: ?CFT <3 seconds b/l LE. Palpable DP pulse(s) b/l LE. Palpable PT pulse(s) b/l LE. Pedal hair absent. No pain with calf compression b/l. Trace edema noted BLE. No ischemia or gangrene noted b/l LE. No cyanosis or clubbing noted b/l LE. ? ?Neurological Examination: ?Protective sensation intact 5/5 intact bilaterally with 10g monofilament b/l. Vibratory sensation intact b/l. ? ?Dermatological Examination: ?Pedal skin is warm and supple b/l LE. No open wounds b/l LE. No interdigital macerations noted b/l LE. Toenails 1-5 bilaterally elongated, discolored, dystrophic, thickened, and crumbly with subungual debris and tenderness to dorsal palpation. Porokeratotic lesion(s) submet head 5 b/l. No erythema, no edema, no drainage, no fluctuance. ? ?Musculoskeletal Examination: ?Muscle strength 5/5 to all lower extremity muscle groups bilaterally. Plantarflexed metatarsal(s) 5th metatarsal head b/l lower extremities. ? ?Radiographs: None ? ?Assessment/Plan: ?1. Pain due to onychomycosis of toenails of both feet   ?  ?-Patient was evaluated and treated. All patient's and/or  POA's questions/concerns answered on today's visit. ?-Medicaid ABN signed on today. ?-Mycotic toenails 1-5 bilaterally were debrided in length and girth with sterile nail nippers and dremel without incident. ?-As a courtesy,painful porokeratotic lesion(s) submet head 5 b/l pared and enucleated with sterile scalpel blade without incident. Total number of lesions debrided=2. ?-Patient/POA to call should there be question/concern in the interim.  ? ?Return in about 3 months (around 05/14/2022). ? ?Marzetta Board, DPM  ?

## 2022-02-23 NOTE — Progress Notes (Deleted)
Cardiology Office Note    Date:  02/23/2022   ID:  Robert Chan, DOB 1956-09-04, MRN [id number]   PCP:  Elsie Stain, MD   [place]  Cardiologist:  [name], MD *** Advanced Practice Provider:  No care team member to display Electrophysiologist:  None   [phone]   No chief complaint on file.   History of Present Illness:  Robert Chan is a 66 y.o. male has a hx of CAD, CKD, COPD, depression and HTN. He presented in 06/2020 with NSTEMI and underwent cardiac cath which showed 30% prox LAD and mid LAD myocardial bridge and 20% mid RCA with normal LVF and normal LVEDP. Echocardiogram showed normal LVF with G1DD and no valvular heart disease. 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    He was then referred to Dr.Turner to establish cardiac care 12/2020. He was noted to have chronic DOE related to COPD and asthma. He also had complaints of intermittent chest pressure a few times a week. He was also having issues with LE edema although admitted to adding salt and eating  bacon, sausage, and chips. He was set up for a Lexiscan stress test 01/08/21 that showed no ischemia or infarct.   Patient saw Ms. Glenford Peers 01/19/22 with exertional chest pain. Coronary CTA  02/02/22 calcium score 589, mild non-obstructive CAD. Echo 01/21/22 normal LVEF 55-60% with mild HK mid infsep and inf wall.  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   Depression    Elevated serum creatinine 08/28/2019   Homelessness 06/19/2020   Hypertension    NSTEMI (non-ST elevated myocardial infarction) [facility])    Status post incision and drainage 04/22/2021   Suicidal  ideation 06/05/2020    Past Surgical History:  Procedure Laterality Date   APPENDECTOMY     EYE SURGERY     LEFT HEART CATH AND CORONARY ANGIOGRAPHY N/A 06/05/2020   Procedure: LEFT HEART CATH AND CORONARY ANGIOGRAPHY;  Surgeon: Nigel Mormon, MD;  Location: [facility];  Service: Cardiovascular;  Laterality: N/A;   PROSTATE SURGERY      Current Medications: No outpatient medications have been marked as taking for the 03/02/22 encounter (Appointment) with Imogene Burn, PA-C.     Allergies:   Shellfish allergy   Social History   Socioeconomic History   Marital status: Single    Spouse name: Not on file   Number of children: Not on file   Years of education: Not on file   Highest education level: Not on file  Occupational History   Not on file  Tobacco Use   Smoking status: Former    Types: Cigarettes    Quit date: 12/25/2020    Years since quitting: 1.1   Smokeless tobacco: Never   Tobacco comments:    smokes 3-4 cigarettes/day  Vaping Use   Vaping Use: Never used  Substance and Sexual Activity   Alcohol use: Yes    Comment: drinks 2x/wk, 2-3 40 oz drinks, last drink Friday   Drug use: Yes    Types: Cocaine    Comment: cocaine use once a  month, last use Friday    Sexual activity: Not Currently  Other Topics Concern   Not on file  Social History Narrative   His mother passed at 32 (when he was 85 years old) and his father raised him and siblings   His parents were pastors as a lot of his sisters and other family members are today   There was a total of 13 children in his family Had 42 sisters, one deceased , Had 3 brothers, all deceased   He is the youngest of the 90 and was "always in trouble in my younger years"   Family still live in [place], some in [place], [place]   Has a Daughter and son with a total of 10 grandchildren (29 grand daughters local and 103 grandsons)    Values family   Married twice, present wife incarcerated   Social Determinants of  Radio broadcast assistant Strain: Low Risk    Difficulty of Paying Living Expenses: Not hard at all  Food Insecurity: No Food Insecurity   Worried About Charity fundraiser in the Last Year: Never true   Arboriculturist in the Last Year: Never true  Transportation Needs: No Transportation Needs   Lack of Transportation (Medical): No   Lack of Transportation (Non-Medical): No  Physical Activity: Not on file  Stress: No Stress Concern Present   Feeling of Stress : Only a little  Social Connections: Moderately Integrated   Frequency of Communication with Friends and Family: Three times a week   Frequency of Social Gatherings with Friends and Family: Three times a week   Attends Religious Services: 1 to 4 times per year   Active Member of Clubs or Organizations: Yes   Attends Archivist Meetings: 1 to 4 times per year   Marital Status: Never married     Family History:  The patient's ***family history includes Diabetes in his mother; Hypertension in his father, mother, and sister.   ROS:   Please see the history of present illness.    ROS All other systems reviewed and are negative.   PHYSICAL EXAM:   VS:  There were no vitals taken for this visit.  Physical Exam  GEN: Well nourished, well developed, in no acute distress  HEENT: normal  Neck: no JVD, carotid bruits, or masses Cardiac:RRR; no murmurs, rubs, or gallops  Respiratory:  clear to auscultation bilaterally, normal work of breathing GI: soft, nontender, nondistended, + BS Ext: without cyanosis, clubbing, or edema, Good distal pulses bilaterally MS: no deformity or atrophy  Skin: warm and dry, no rash Neuro:  Alert and Oriented x 3, Strength and sensation are intact Psych: euthymic mood, full affect  Wt Readings from Last 3 Encounters:  01/19/22 (!) 300 lb 8 oz (136.3 kg)  01/11/22 (!) 304 lb (137.9 kg)  09/28/21 (!) 306 lb 3.2 oz (138.9 kg)      Studies/Labs Reviewed:   EKG:  EKG is*** ordered  today.  The ekg ordered today demonstrates ***  Recent Labs: 09/28/2021: ALT 18 01/19/2022: BUN 12; Creatinine, Ser 1.15; Hemoglobin 12.7; Platelets 257; Potassium 4.5; Sodium 138   Lipid Panel    Component Value Date/Time   CHOL 155 09/28/2021 1514   TRIG 101 09/28/2021 1514   HDL 57 09/28/2021 1514   CHOLHDL 2.7 09/28/2021 1514   CHOLHDL 2.4 09/09/2020 0654   VLDL 15 09/09/2020 0654   LDLCALC 80 09/28/2021 1514    Additional studies/ records that were  reviewed today include:  Coronary CTA 02/02/22 FINDINGS: Coronary calcium score: The patient's coronary artery calcium score is 589, which places the patient in the 93rd percentile.   Coronary arteries: Normal coronary origins.  Right dominance.   Right Coronary Artery: Normal caliber vessel, gives rise to PDA. No significant plaque or stenosis.   Left Main Coronary Artery: Normal caliber vessel. Mixed calcified and noncalcified plaque with 1-24% stenosis.   Left Anterior Descending Coronary Artery: Normal caliber vessel. There is diffuse mixed calcified and noncalcified plaque throughout the proximal LAD with maximum 25-49% stenosis (see image). Gives rise to 3 small diagonal branches.   Left Circumflex Artery: Normal caliber vessel. Focal mixed calcified and noncalcified plaque in the mid LCx with 1-24% stenosis. Gives rise to 2 OM branches.   Aorta: Normal size, 36 mm at the mid ascending aorta (level of the PA bifurcation) measured double oblique. Trivial aortic atherosclerosis. No dissection seen in visualized portions of the aorta.   Aortic Valve: Trivial calcifications. Trileaflet.   Other findings:   Normal pulmonary vein drainage into the left atrium.   Normal left atrial appendage without a thrombus.   Normal size of the pulmonary artery.   Normal appearance of the pericardium.   IMPRESSION: 1.  Mild non-obstructive CAD, CADRADS = 2.   2. Coronary calcium score of 589. This was 93rd percentile for  age and sex matched control.   3. Normal coronary origin with right dominance.   Echo 01/21/22 IMPRESSIONS     1. Left ventricular ejection fraction, by estimation, is 55 to 60%. The  left ventricle has normal function. The left ventricle demonstrates  regional wall motion abnormalities (see scoring diagram/findings for  description). Left ventricular diastolic  parameters were normal. There is mild hypokinesis of the left ventricular,  mid inferoseptal wall and inferior wall.   2. Right ventricular systolic function is normal. The right ventricular  size is normal.   3. The mitral valve is normal in structure. Trivial mitral valve  regurgitation. No evidence of mitral stenosis.   4. The aortic valve is grossly normal. Aortic valve regurgitation is not  visualized. No aortic stenosis is present.   5. The inferior vena cava is normal in size with greater than 50%  respiratory variability, suggesting right atrial pressure of 3 mmHg.   Comparison(s): Prior images reviewed side by side. Changes from prior  study are noted. Mid inferoseptal and inferior walls with mild  hypokinesis; change from prior.   FINDINGS    Risk Assessment/Calculations:   {Does this patient have ATRIAL FIBRILLATION?[phone]}     ASSESSMENT:    No diagnosis found.   PLAN:  In order of problems listed above:  NSTEMi felt due to coronary vasospasm in the setting of cocaine use 06/2020, recurrent chest pain low risk lexi 12/2020, with Coronary CTA 02/02/22 Calcium score 589 non-obstructive disease, echo normal LV with WMA  HTN  CKD  COPD  Shared Decision Making/Informed Consent   {Are you ordering a CV Procedure (e.g. stress test, cath, DCCV, TEE, etc)?   Press F2        :[id number]}    Medication Adjustments/Labs and Tests Ordered: Current medicines are reviewed at length with the patient today.  Concerns regarding medicines are outlined above.  Medication changes, Labs and Tests ordered today  are listed in the Patient Instructions below. There are no Patient Instructions on file for this visit.   Sumner Boast, PA-C  02/23/2022 3:00 PM    [facility] [address], [place], [place]  [address] Phone: [phone]; Fax: [phone]

## 2022-02-25 DIAGNOSIS — H25011 Cortical age-related cataract, right eye: Secondary | ICD-10-CM | POA: Diagnosis not present

## 2022-02-25 DIAGNOSIS — H52203 Unspecified astigmatism, bilateral: Secondary | ICD-10-CM | POA: Diagnosis not present

## 2022-02-25 DIAGNOSIS — Z961 Presence of intraocular lens: Secondary | ICD-10-CM | POA: Diagnosis not present

## 2022-02-25 DIAGNOSIS — H5201 Hypermetropia, right eye: Secondary | ICD-10-CM | POA: Diagnosis not present

## 2022-02-25 DIAGNOSIS — H401132 Primary open-angle glaucoma, bilateral, moderate stage: Secondary | ICD-10-CM | POA: Diagnosis not present

## 2022-02-25 DIAGNOSIS — H2511 Age-related nuclear cataract, right eye: Secondary | ICD-10-CM | POA: Diagnosis not present

## 2022-02-25 DIAGNOSIS — H524 Presbyopia: Secondary | ICD-10-CM | POA: Diagnosis not present

## 2022-02-25 DIAGNOSIS — H5212 Myopia, left eye: Secondary | ICD-10-CM | POA: Diagnosis not present

## 2022-02-26 NOTE — Telephone Encounter (Signed)
The patient has been notified of the result and verbalized understanding.  All questions (if any) were answered. ?Robert Iba, RN 02/26/2022 9:58 AM  ? ?

## 2022-03-02 ENCOUNTER — Ambulatory Visit: Payer: Medicare HMO | Admitting: Physician Assistant

## 2022-03-02 DIAGNOSIS — I10 Essential (primary) hypertension: Secondary | ICD-10-CM

## 2022-03-02 DIAGNOSIS — I2510 Atherosclerotic heart disease of native coronary artery without angina pectoris: Secondary | ICD-10-CM

## 2022-03-07 IMAGING — DX DG CHEST 2V
2 series · 2 of 2 positions shown · non-contrast
Comparison: 09/23/2019.

CLINICAL DATA: Chest pain.  Shortness of breath.

EXAM:
CHEST - 2 VIEW

[chest pa]
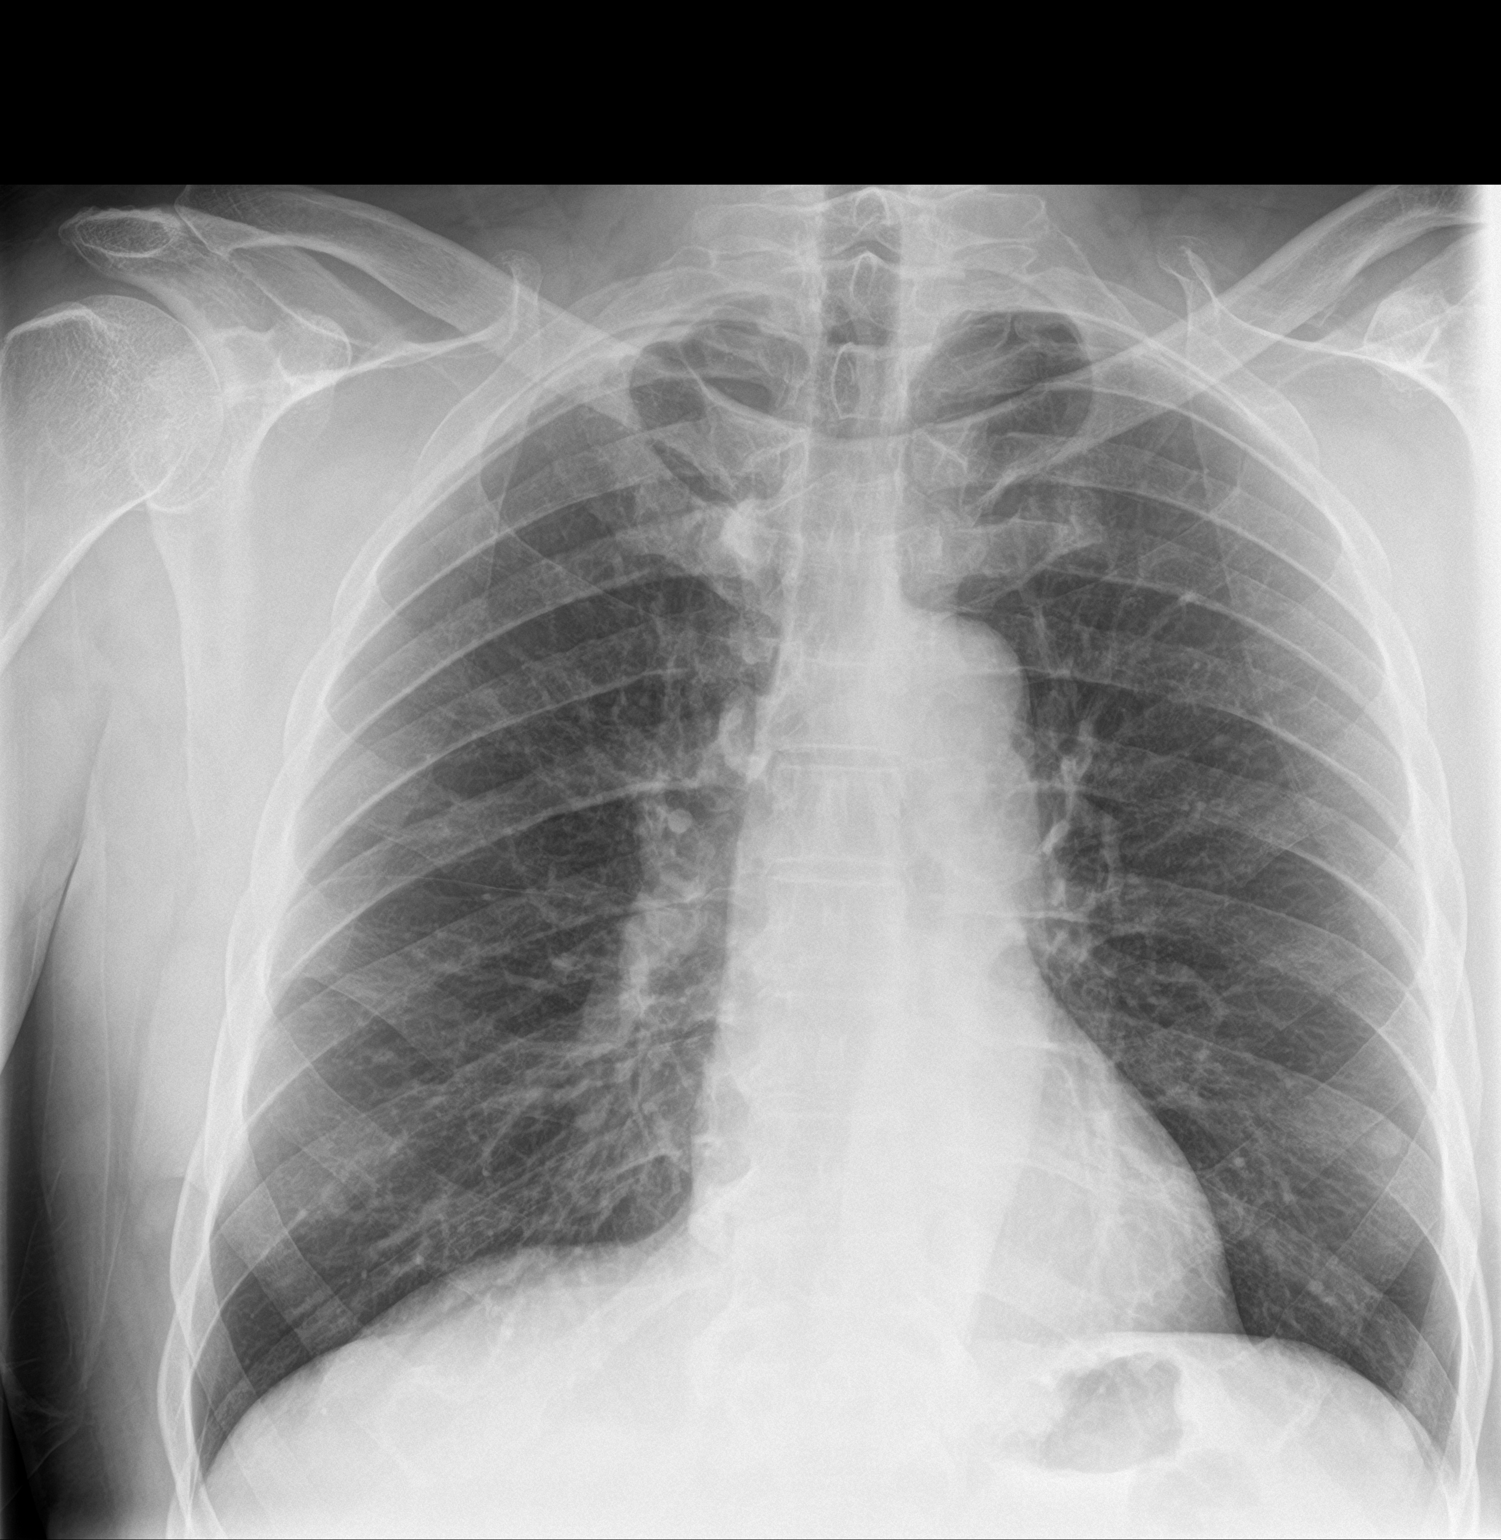

[chest lat]
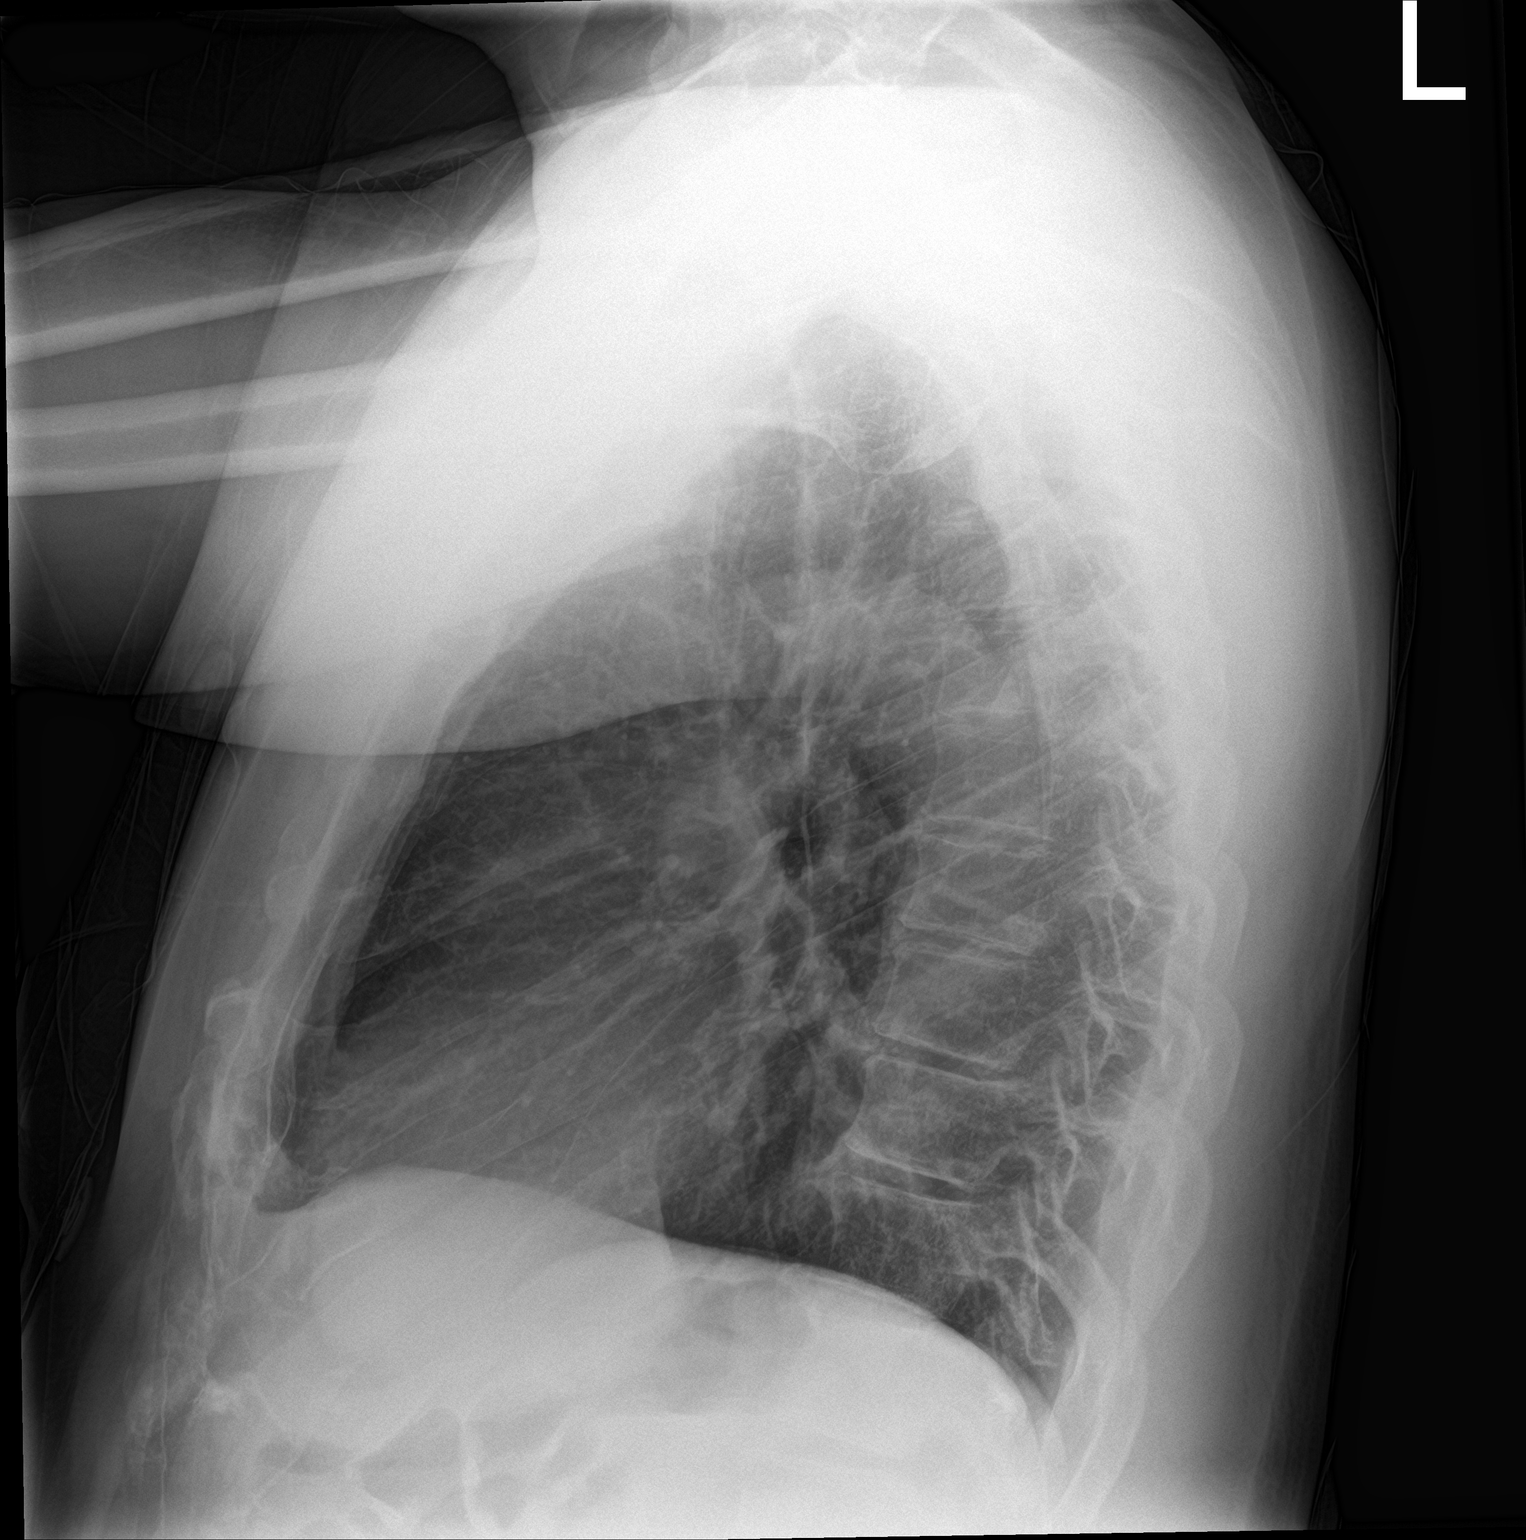

[2 of 2 positions shown; findings below may reference images not displayed]

FINDINGS: Mediastinum hilar structures normal. Lungs are clear. Bilateral
nipple shadows again noted. No pleural effusion or pneumothorax.
Heart size normal. Degenerative change thoracic spine.
IMPRESSION: No acute cardiopulmonary disease.

## 2022-03-16 ENCOUNTER — Ambulatory Visit: Payer: Self-pay | Admitting: *Deleted

## 2022-03-17 ENCOUNTER — Other Ambulatory Visit: Payer: Self-pay | Admitting: *Deleted

## 2022-03-17 NOTE — Patient Outreach (Signed)
[facility]) Care Management ?Telephonic RN Care Manager Note ? ? ?03/17/2022 ?Name:  Robert Chan MRN:  [id number] DOB:  05-18-1956 ? ?Summary: ?[facility] Unsuccessful outreach ?Outreach attempt to the listed at the preferred outreach number in [place]  ?No answer. [facility] RN CM left HIPAA [facility] Portability and Accountability Act) compliant voicemail message along with CM?s contact info.  ? ? ?Subjective: ?Robert Chan is an 66 y.o. year old male who is a primary patient of Robert Gaskins Burnett Harry, MD. The care management team was consulted for assistance with care management and/or care coordination needs.   ? ?Telephonic RN Care Manager completed Telephone Visit today.  ? ?Objective: ? ?Medications Reviewed Today   ? ? Reviewed by Marzetta Board, DPM (Physician) on 02/12/22 at 0724  Med List Status: <None>  ? ?Medication Order Taking? Sig Documenting Provider Last Dose Status Informant  ?albuterol (VENTOLIN HFA) 108 (90 Base) MCG/ACT inhaler [id number] No Inhale 1-2 puffs into the lungs every 6 (six) hours as needed for wheezing or shortness of breath. Elsie Stain, MD Taking Active   ?amLODipine (NORVASC) 10 MG tablet [id number] No Take 1 tablet (10 mg total) by mouth daily. Elsie Stain, MD Taking Active   ?aspirin 81 MG EC tablet [id number] No Take 1 tablet (81 mg total) by mouth daily. Charlott Rakes, MD Taking Active   ?atorvastatin (LIPITOR) 40 MG tablet [id number] No Take 1 tablet (40 mg total) by mouth daily. Elsie Stain, MD Taking Active   ?diltiazem [facility]) 240 MG 24 hr capsule [id number] No Take 1 capsule (240 mg total) by mouth daily. Elsie Stain, MD Taking Active   ?latanoprost (XALATAN) 0.005 % ophthalmic solution [id number] No Place 1 drop into both eyes nightly. Elsie Stain, MD Taking Active   ?metoprolol tartrate (LOPRESSOR) 100 MG tablet [id number]  Take 2 hours prior to Cardiac CT Tommie Jaydan, NP  Active   ?sulfamethoxazole-trimethoprim (BACTRIM  DS) 800-160 MG tablet [id number] No Take 1 tablet by mouth 2 (two) times daily. [provider] Taking Active   ?SYMBICORT 160-4.5 MCG/ACT inhaler [id number] No Inhale 2 puffs into the lungs in the morning and at bedtime. Elsie Stain, MD Taking Active   ?valsartan (DIOVAN) 160 MG tablet [id number] No Take 1 tablet (160 mg total) by mouth daily. Elsie Stain, MD Taking Active   ?Vitamin D, Ergocalciferol, (DRISDOL) 1.25 MG (50000 UNIT) CAPS capsule [id number] No Take 1 capsule (50,000 Units total) by mouth once a week. Elsie Stain, MD Taking Active   ? ?  ?  ? ?  ? ? ? ?SDOH:  (Social Determinants of Health) assessments and interventions performed:  ? ? ?Care Plan ? ?Review of patient past medical history, allergies, medications, health status, including review of consultants reports, laboratory and other test data, was performed as part of comprehensive evaluation for care management services.  ? ?There are no care plans that you recently modified to display for this patient. ?  ? ?Plan: The patient has been provided with contact information for the care management team and has been advised to call with any health related questions or concerns.  ?The care management team will reach out to the patient again over the next 30+ business days. ? ?Robert Chan L. Lavina Hamman, RN, BSN, CCM ?[place] Management Care Coordinator ?Office number [phone] ?Main [facility] number [phone] ?Fax number [phone] ? ? ? ? ? ?

## 2022-03-23 ENCOUNTER — Other Ambulatory Visit: Payer: Self-pay | Admitting: *Deleted

## 2022-03-23 NOTE — Patient Outreach (Signed)
[facility]) Care Management Telephonic RN Care Manager Note   03/23/2022 Name:  Robert Chan MRN:  [id number] DOB:  1956/07/27  Summary: [facility] Unsuccessful outreach Outreach attempt to the listed at the preferred outreach number in [place]  No answer. [facility] RN CM left HIPAA [facility] Portability and Accountability Act) compliant voicemail message along with CM's contact info.   Recommendations/Changes made from today's visit: Unsuccessful outreaches on 03/17/22 &  03/23/22  Unsuccessful letter mailed on 03/23/22   Subjective: Robert Chan is an 66 y.o. year old male who is a primary patient of Elsie Stain, MD. The care management team was consulted for assistance with care management and/or care coordination needs.    Telephonic RN Care Manager completed Telephone Visit today.   Objective:  Medications Reviewed Today     Reviewed by Marzetta Board, DPM (Physician) on 02/12/22 at 0724  Med List Status: <None>   Medication Order Taking? Sig Documenting Provider Last Dose Status Informant  albuterol (VENTOLIN HFA) 108 (90 Base) MCG/ACT inhaler [id number] No Inhale 1-2 puffs into the lungs every 6 (six) hours as needed for wheezing or shortness of breath. Elsie Stain, MD Taking Active   amLODipine (NORVASC) 10 MG tablet [id number] No Take 1 tablet (10 mg total) by mouth daily. Elsie Stain, MD Taking Active   aspirin 81 MG EC tablet [id number] No Take 1 tablet (81 mg total) by mouth daily. Charlott Rakes, MD Taking Active   atorvastatin (LIPITOR) 40 MG tablet [id number] No Take 1 tablet (40 mg total) by mouth daily. Elsie Stain, MD Taking Active   diltiazem [facility]) 240 MG 24 hr capsule [id number] No Take 1 capsule (240 mg total) by mouth daily. Elsie Stain, MD Taking Active   latanoprost (XALATAN) 0.005 % ophthalmic solution [id number] No Place 1 drop into both eyes nightly. Elsie Stain, MD Taking Active   metoprolol tartrate  (LOPRESSOR) 100 MG tablet [id number]  Take 2 hours prior to Cardiac CT Tommie Earmon, NP  Active   sulfamethoxazole-trimethoprim (BACTRIM DS) 800-160 MG tablet [id number] No Take 1 tablet by mouth 2 (two) times daily. [provider] Taking Active   SYMBICORT 160-4.5 MCG/ACT inhaler [id number] No Inhale 2 puffs into the lungs in the morning and at bedtime. Elsie Stain, MD Taking Active   valsartan (DIOVAN) 160 MG tablet [id number] No Take 1 tablet (160 mg total) by mouth daily. Elsie Stain, MD Taking Active   Vitamin D, Ergocalciferol, (DRISDOL) 1.25 MG (50000 UNIT) CAPS capsule [id number] No Take 1 capsule (50,000 Units total) by mouth once a week. Elsie Stain, MD Taking Active              SDOH:  (Social Determinants of Health) assessments and interventions performed:    Care Plan  Review of patient past medical history, allergies, medications, health status, including review of consultants reports, laboratory and other test data, was performed as part of comprehensive evaluation for care management services.   There are no care plans that you recently modified to display for this patient.    Plan: The patient has been provided with contact information for the care management team and has been advised to call with any health related questions or concerns.  The care management team will reach out to the patient again over the next 30+ business days.  Sela Falk L. Lavina Hamman, RN, BSN, [place] Coordinator Office number [phone] [facility] number [phone]  Fax number [phone]

## 2022-03-31 ENCOUNTER — Ambulatory Visit (INDEPENDENT_AMBULATORY_CARE_PROVIDER_SITE_OTHER): Payer: Medicare HMO | Admitting: Podiatry

## 2022-03-31 ENCOUNTER — Encounter: Payer: Self-pay | Admitting: *Deleted

## 2022-03-31 ENCOUNTER — Other Ambulatory Visit: Payer: Self-pay | Admitting: *Deleted

## 2022-03-31 DIAGNOSIS — B351 Tinea unguium: Secondary | ICD-10-CM | POA: Diagnosis not present

## 2022-03-31 DIAGNOSIS — M79675 Pain in left toe(s): Secondary | ICD-10-CM

## 2022-03-31 DIAGNOSIS — I739 Peripheral vascular disease, unspecified: Secondary | ICD-10-CM | POA: Diagnosis not present

## 2022-03-31 DIAGNOSIS — L84 Corns and callosities: Secondary | ICD-10-CM

## 2022-03-31 DIAGNOSIS — M79674 Pain in right toe(s): Secondary | ICD-10-CM | POA: Diagnosis not present

## 2022-03-31 NOTE — Progress Notes (Signed)
    Subjective: Patient is a 66 y.o. male presenting to the office today with a chief complaint of painful callus lesion(s) noted to the left foot has been present for several months Patient also complains of elongated, thickened nails that cause pain while ambulating in shoes.  He is unable to trim his own nails. Patient presents today for further treatment and evaluation.  Past Medical History:  Diagnosis Date   Acute bilateral low back pain without sciatica 02/27/2021   Asthma    CAD (coronary artery disease)    Cataract    CKD (chronic kidney disease)    Cocaine use disorder, mild, abuse (HCC)    COPD (chronic obstructive pulmonary disease) (HCC)    Coronary vasospasm ([place]) 10/07/2020   Depression    Elevated serum creatinine 08/28/2019   Homelessness 06/19/2020   Hypertension    NSTEMI (non-ST elevated myocardial infarction) [facility])    Status post incision and drainage 04/22/2021   Suicidal ideation 06/05/2020    Objective:  Physical Exam General: Alert and oriented x3 in no acute distress  Dermatology: Hyperkeratotic lesion(s) present on the left foot. Pain on palpation with a central nucleated core noted. Skin is warm, dry and supple bilateral lower extremities. Negative for open lesions or macerations. Nails are tender, long, thickened and dystrophic with subungual debris, consistent with onychomycosis, 1-5 bilateral. No signs of infection noted.  Vascular: Mild chronic edema noted.  Skin is warm to touch.  Neurological: Epicritic and protective threshold grossly intact bilaterally.   Musculoskeletal Exam: Pain on palpation at the keratotic lesion(s) noted. Range of motion within normal limits bilateral. Muscle strength 5/5 in all groups bilateral.  Assessment: 1. Onychodystrophic nails 1-5 bilateral with hyperkeratosis of nails.  2. Onychomycosis of nail due to dermatophyte bilateral 3.  Preulcerative callus lesion to the subfifth MTPJ bilateral   Plan of Care:  1.  Patient evaluated. 2. Excisional debridement of keratoic lesion(s) using a chisel blade was performed without incident.  3. Dressed with light dressing. 4. Mechanical debridement of nails 1-5 bilaterally performed using a nail nipper. Filed with dremel without incident.  5. Patient is to return to the clinic in 3 months.   Edrick Kins, DPM [facility]  Dr. Edrick Kins, DPM    [address], [place] [address]                Office [phone]  Fax [phone]

## 2022-03-31 NOTE — Patient Outreach (Addendum)
Robert Chan) Care Management Telephonic RN Care Manager Note   03/31/2022 Name:  Robert Chan MRN:  [id number] DOB:  1955/11/23  Summary:  Returned a call to patient after a few messages left by patient in response to messages and mailed an unsuccessful letter from RN CM   He reports he is doing better  He is more talkative and cheerful  Flowsheet Row Patient Outreach Telephone from 03/31/2022 in [place] Coordination  PHQ-2 Total Score 0      He reports he is going to the gym and walking a mile with weighted backpack more He state he was denied for [employer] Alternatives Programs (CAP) services recently as he was found to be able to complete tasks independent  Podiatry seen this morning for toe nail trimming/He is unable to trim them independently -painful callus lesion(s) noted to the left foot/thickened nails  Still has not completed his home colon screening His prefers to complete at his next pcp office visit  Eyes still itch a lot he reports he can see He confirms taking his shots   social determinants of health (SDOH) Plans to visit family in [place] the next weekend Spent time with family during the memorial holiday weekend   Denies medical worsening symptoms related to CAD, COPD  Recommendations/Changes made from today's visit: Follow up outreach completed with assessment for care coordination needs Provided some disease management education  Discussed eating more proteins and moving more Discussed protein foods- chicken, seafood, egg white, beans, lean beef. Protein supplements, nuts Education on ACS, HTN, Wellness Obesity COPD Discussed the importance of podiatry visits to prevent infection and wounds Explained purpose of colon screening  Discussed CAP services  Allowed him to ventilate his feelings Reviewed [facility] unsuccessful outreach/case closure workflow  Subjective: Robert Chan is an 66 y.o. year old  male who is a primary patient of Elsie Stain, MD. The care management team was consulted for assistance with care management and/or care coordination needs.    Telephonic RN Care Manager completed Telephone Visit today.   Objective:  Medications Reviewed Today     Reviewed by Barbaraann Faster, RN (Registered Nurse) on 03/31/22 at 1338  Med List Status: <None>   Medication Order Taking? Sig Documenting Provider Last Dose Status Informant  albuterol (VENTOLIN HFA) 108 (90 Base) MCG/ACT inhaler [id number]  Inhale 1-2 puffs into the lungs every 6 (six) hours as needed for wheezing or shortness of breath. Elsie Stain, MD  Active   amLODipine (NORVASC) 10 MG tablet [id number]  Take 1 tablet (10 mg total) by mouth daily. Elsie Stain, MD  Active   aspirin 81 MG EC tablet [id number]  Take 1 tablet (81 mg total) by mouth daily. Charlott Rakes, MD  Active   atorvastatin (LIPITOR) 40 MG tablet [id number]  Take 1 tablet (40 mg total) by mouth daily. Elsie Stain, MD  Active   diltiazem [facility]) 240 MG 24 hr capsule [id number]  Take 1 capsule (240 mg total) by mouth daily. Elsie Stain, MD  Active   latanoprost (XALATAN) 0.005 % ophthalmic solution [id number]  Place 1 drop into both eyes nightly. Elsie Stain, MD  Active   metoprolol tartrate (LOPRESSOR) 100 MG tablet [id number]  Take 2 hours prior to Cardiac CT Tommie Kimmy, NP  Active   sulfamethoxazole-trimethoprim (BACTRIM DS) 800-160 MG tablet [id number]  Take 1 tablet by mouth 2 (two) times daily. [provider]  Active   [name]  160-4.5 MCG/ACT inhaler [id number]  Inhale 2 puffs into the lungs in the morning and at bedtime. Elsie Stain, MD  Active   valsartan (DIOVAN) 160 MG tablet [id number]  Take 1 tablet (160 mg total) by mouth daily. Elsie Stain, MD  Active   Vitamin D, Ergocalciferol, (DRISDOL) 1.25 MG (50000 UNIT) CAPS capsule [id number]  Take 1 capsule (50,000 Units total) by mouth once a week.  Elsie Stain, MD  Active              SDOH:  (Social Determinants of Health) assessments and interventions performed:  SDOH Interventions    Flowsheet Row Most Recent Value  SDOH Interventions   Food Insecurity Interventions Intervention Not Indicated  Financial Strain Interventions Intervention Not Indicated  Housing Interventions Intervention Not Indicated  Stress Interventions Intervention Not Indicated  Social Connections Interventions Intervention Not Indicated  Transportation Interventions Intervention Not Indicated       Care Plan  Review of patient past medical history, allergies, medications, health status, including review of consultants reports, laboratory and other test data, was performed as part of comprehensive evaluation for care management services.   Care Plan : RN CM plan of care  Updates made by Barbaraann Faster, RN since 03/31/2022 12:00 AM     Problem: Complex Care Coordination Needs and disease management in patient with HTN, CAD   Priority: High     Long-Range Goal: Establish Plan of Care for Management Complex SDOH Barriers, disease management and Care Coordination Needs in patient with HTN, CAD   Start Date: 09/07/2021  This Visit's Progress: On track  Recent Progress: On track  Priority: High  Note:   Current Barriers:  Knowledge Deficits related to plan of care for management of CAD, HTN, and atopic dermatitis  Care Coordination needs related to Limited education about CAD, HTN, atopic dermatitis* Barriers, health behaviors, knowledge, some difficulty filling out medical forms 12/09/21 interest in starting mail order services vis [employer], diltiazem fill discrepancy via [facility]- denies need for PCS at this time, remains with same podiatrist & dermatologist 12/18/21 he confirms the BP monitor he obtained is not "the right one" and he will need to purchase another or exchange it with his [employer] benefit, but his home health RN has taken BP  Working on  weight management with pcp office 03/31/22 Unsuccessful outreaches 03/17/22 03/26/22   RN CM Clinical Goal(s):  Patient will verbalize understanding of plan for management of CAD, HTN, and atopic dermatitis as evidenced by improvement in BP values and healing of skin   through collaboration with RN Care manager, provider, and care team.   Interventions: Review of EPIC chart, appointments follow up outreaches for worsening symptoms, care coordination needs and disease management/education needs Inter-disciplinary care team collaboration (see longitudinal plan of care) Evaluation of current treatment plan related to  self management and patient's adherence to plan as established by provider 11/05/21 various in network dermatologists for his insurance plan provided for him to outreach to. Encouraged him to review his [place] benefits for OTC, transportation , dental, fitness, vision, etc for 2023  12/09/21 various calls to [place], pcp/pcp pharmacy related to medication mail order, diltiazem 12/18/21 Assessed for worsening symptoms Followed up on medicine concerns Completed medication adherence assessment  Reviewed the purpose of valsartan (treat high blood pressure, heart failure, and diabetic kidney disease) and diltiazem ( treat high blood pressure, angina, and certain heart arrhythmias) to include differences and importance related to his 2017 dx HTN  & CAD  Reviewed importance of checking BP at home  Encouraged lifestyle changes to assist with possible discontinuation of medication with assist of MD Allowed him to ventilate his feelings  With review of EmmiManager noted Mr Flinchbaugh is not reviewing the EMMI education e-mailed to him on HTN and CAD + 03/31/22 follow up with education : CAP, protein foods, exercise, colon screening, ACS, HTN, Wellness Obesity COPD- allowed him time to ventilate  Hypertension Interventions: (Status: Goal On track: Yes) short term goal  03/31/22 BP elevations continue Last  practice recorded BP readings:  BP Readings from Last 3 Encounters:  02/02/22 (!) 147/100  01/19/22 131/84  01/11/22 138/80  Most recent eGFR/CrCl:  Lab Results  Component Value Date   EGFR 70 01/19/2022    No components found for: CRCL  Evaluation of current treatment plan related to hypertension self management and patient's adherence to plan as established by provider Reviewed medications with patient and discussed importance of compliance Provided assistance with obtaining home blood pressure monitor via his [employer] OTC benefit catalog; Counseled on the importance of exercise goals with target of 150 minutes per week Discussed plans with patient for ongoing care management follow up and provided patient with direct contact information for care management team Advised patient, providing education and rationale, to monitor blood pressure daily and record, calling PCP for findings outside established parameters Reviewed scheduled/upcoming provider appointments including:  Discussed complications of poorly controlled blood pressure such as heart disease, stroke, circulatory complications, vision complications, kidney impairment, sexual dysfunction Screening for signs and symptoms of depression related to chronic disease state  Assessed social determinant of health barriers 10/07/21 outreach to dermatology office to speak with staff about his ineffective present treatment -sent patient a list of other in network dermatologist locally Attempted to assist patient with an outreach to his insurance customer service line to inquire about a possible [employer] gift card her reports he is pending. After an extensive wait time the patient agree to attempt to call back himself and confirmed the correct number listed on his insurance card   CAD Interventions: (Status:  Goal on track:  Yes.) Long Term Goal  03/31/22 changes in det  Assessed understanding of CAD diagnosis Medications reviewed including  medications utilized in CAD treatment plan Provided education on importance of blood pressure control in management of CAD Provided education on Importance of limiting foods high in cholesterol Counseled on importance of regular laboratory monitoring as prescribed Reviewed Importance of taking all medications as prescribed Reviewed Importance of attending all scheduled provider appointments Advised to report any changes in symptoms or exercise tolerance Screening for signs and symptoms of depression related to chronic disease state Assessed social determinant of health barriers   Atopic dermatitis/left painful callus & thickened nails  (Status:  Goal on track:  Yes.)  Short Term Goal Evaluation of current treatment plan related to  Atopic dermatitis/left painful callus & thickened nails , Limited education about Atopic dermatitis/left painful callus & thickened nails* self-management and patient's adherence to plan as established by provider. Discussed plans with patient for ongoing care management follow up and provided patient with direct contact information for care management team Evaluation of current treatment plan related to Atopic dermatitis/left painful callus & thickened nails and patient's adherence to plan as established by provider  Patient Goals/Self-Care Activities: Patient will self administer medications as prescribed Patient will attend all scheduled provider appointments Patient will continue to perform ADL's independently Patient will call provider office for new concerns or questions Obtain a  correct BP cuff to monitor at home as ordered by MD He will obtain a note book to write down each of his providers with assistance of [facility] services for any MI symptoms   Follow Up Plan:  Telephone follow up appointment with care management team member scheduled for:  within the next 60+ business days as agreed The patient has been provided with contact information  for the care management team and has been advised to call with any health related questions or concerns.        Plan: The patient has been provided with contact information for the care management team and has been advised to call with any health related questions or concerns.  The care management team will reach out to the patient again over the next 60+business days.  Samanthan Dugo L. Lavina Hamman, RN, BSN, [place] Coordinator Office number [phone] Main [facility] number [phone] Fax number [phone]

## 2022-04-09 IMAGING — DX DG CHEST 2V
2 series · 2 of 2 positions shown · non-contrast
Comparison: May 02, 2020

CLINICAL DATA: Chest pain and shortness of breath

EXAM:
CHEST - 2 VIEW

[chest pa]
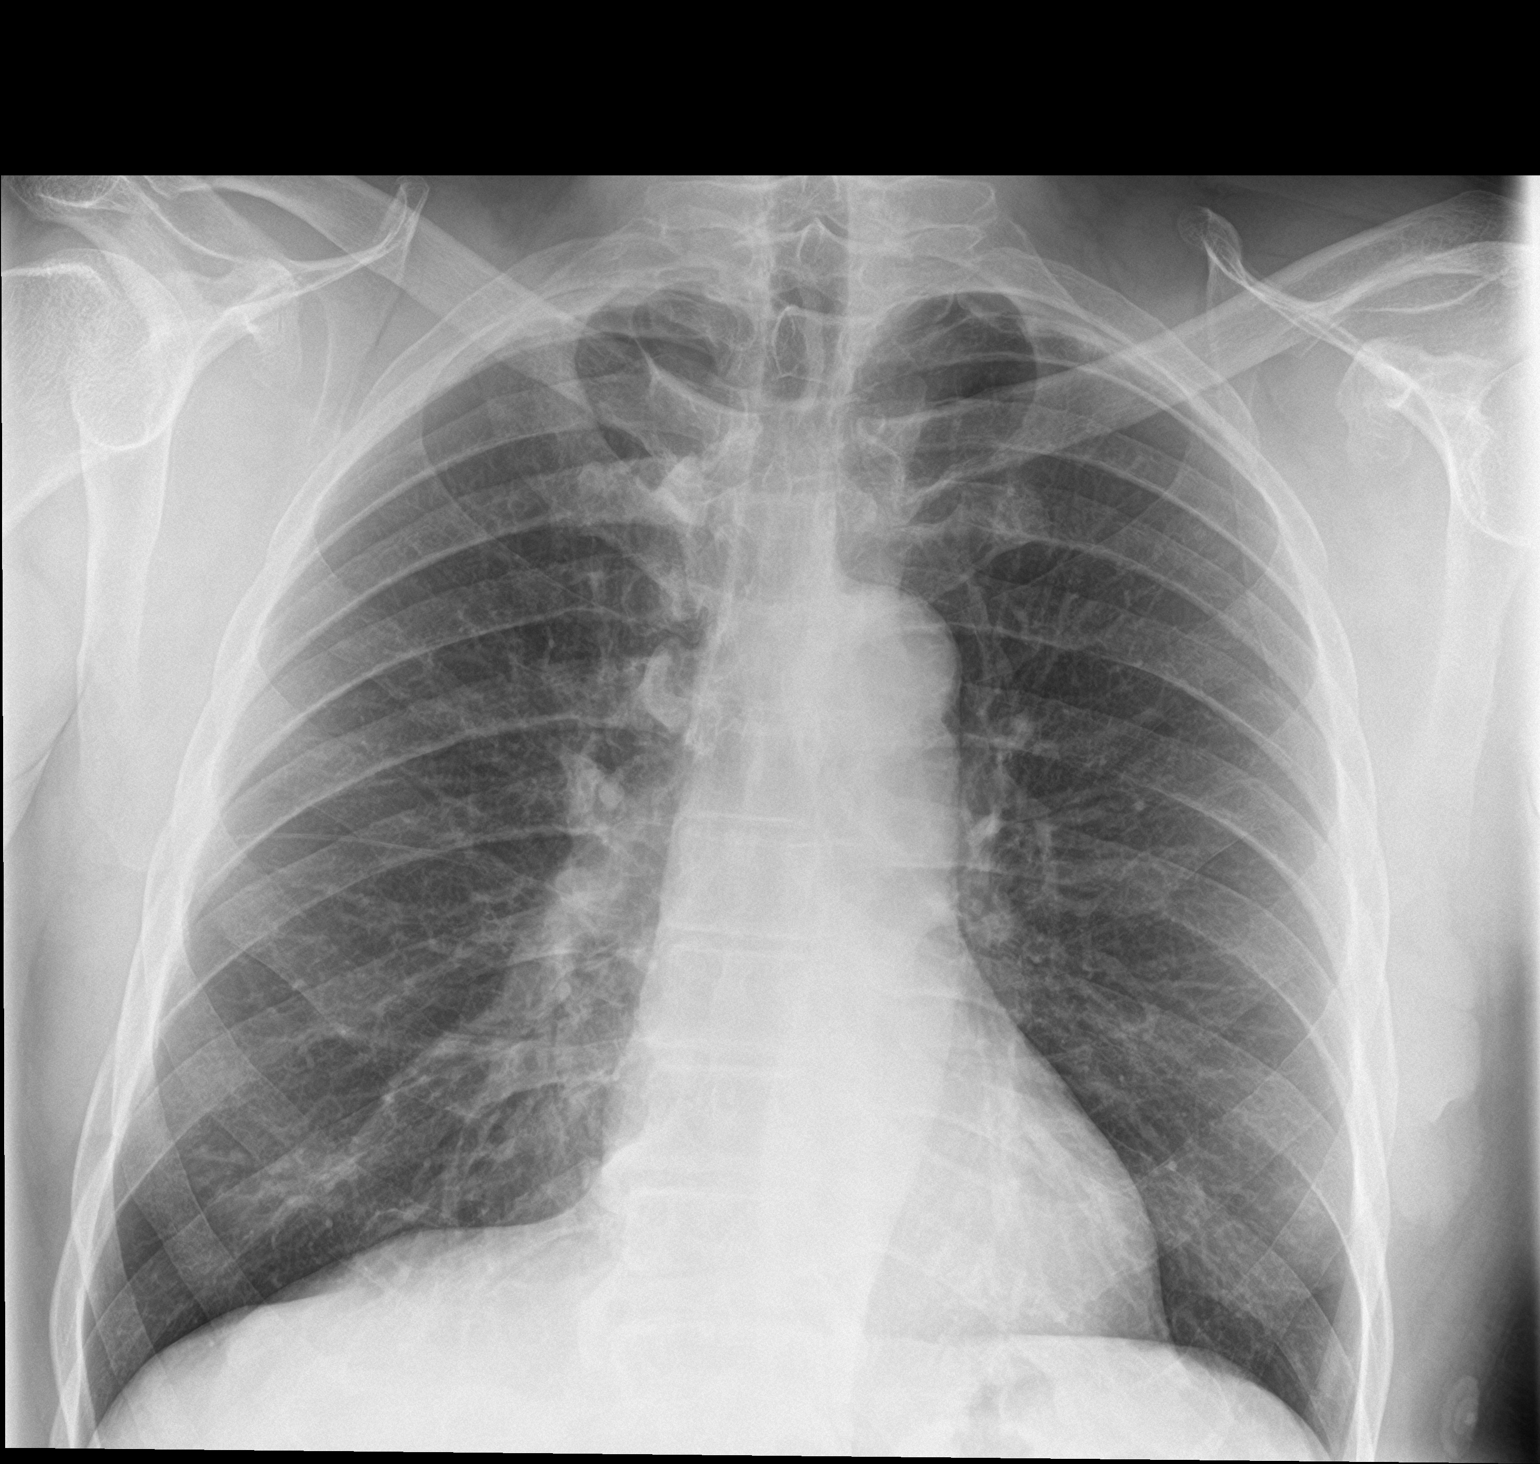

[chest lat]
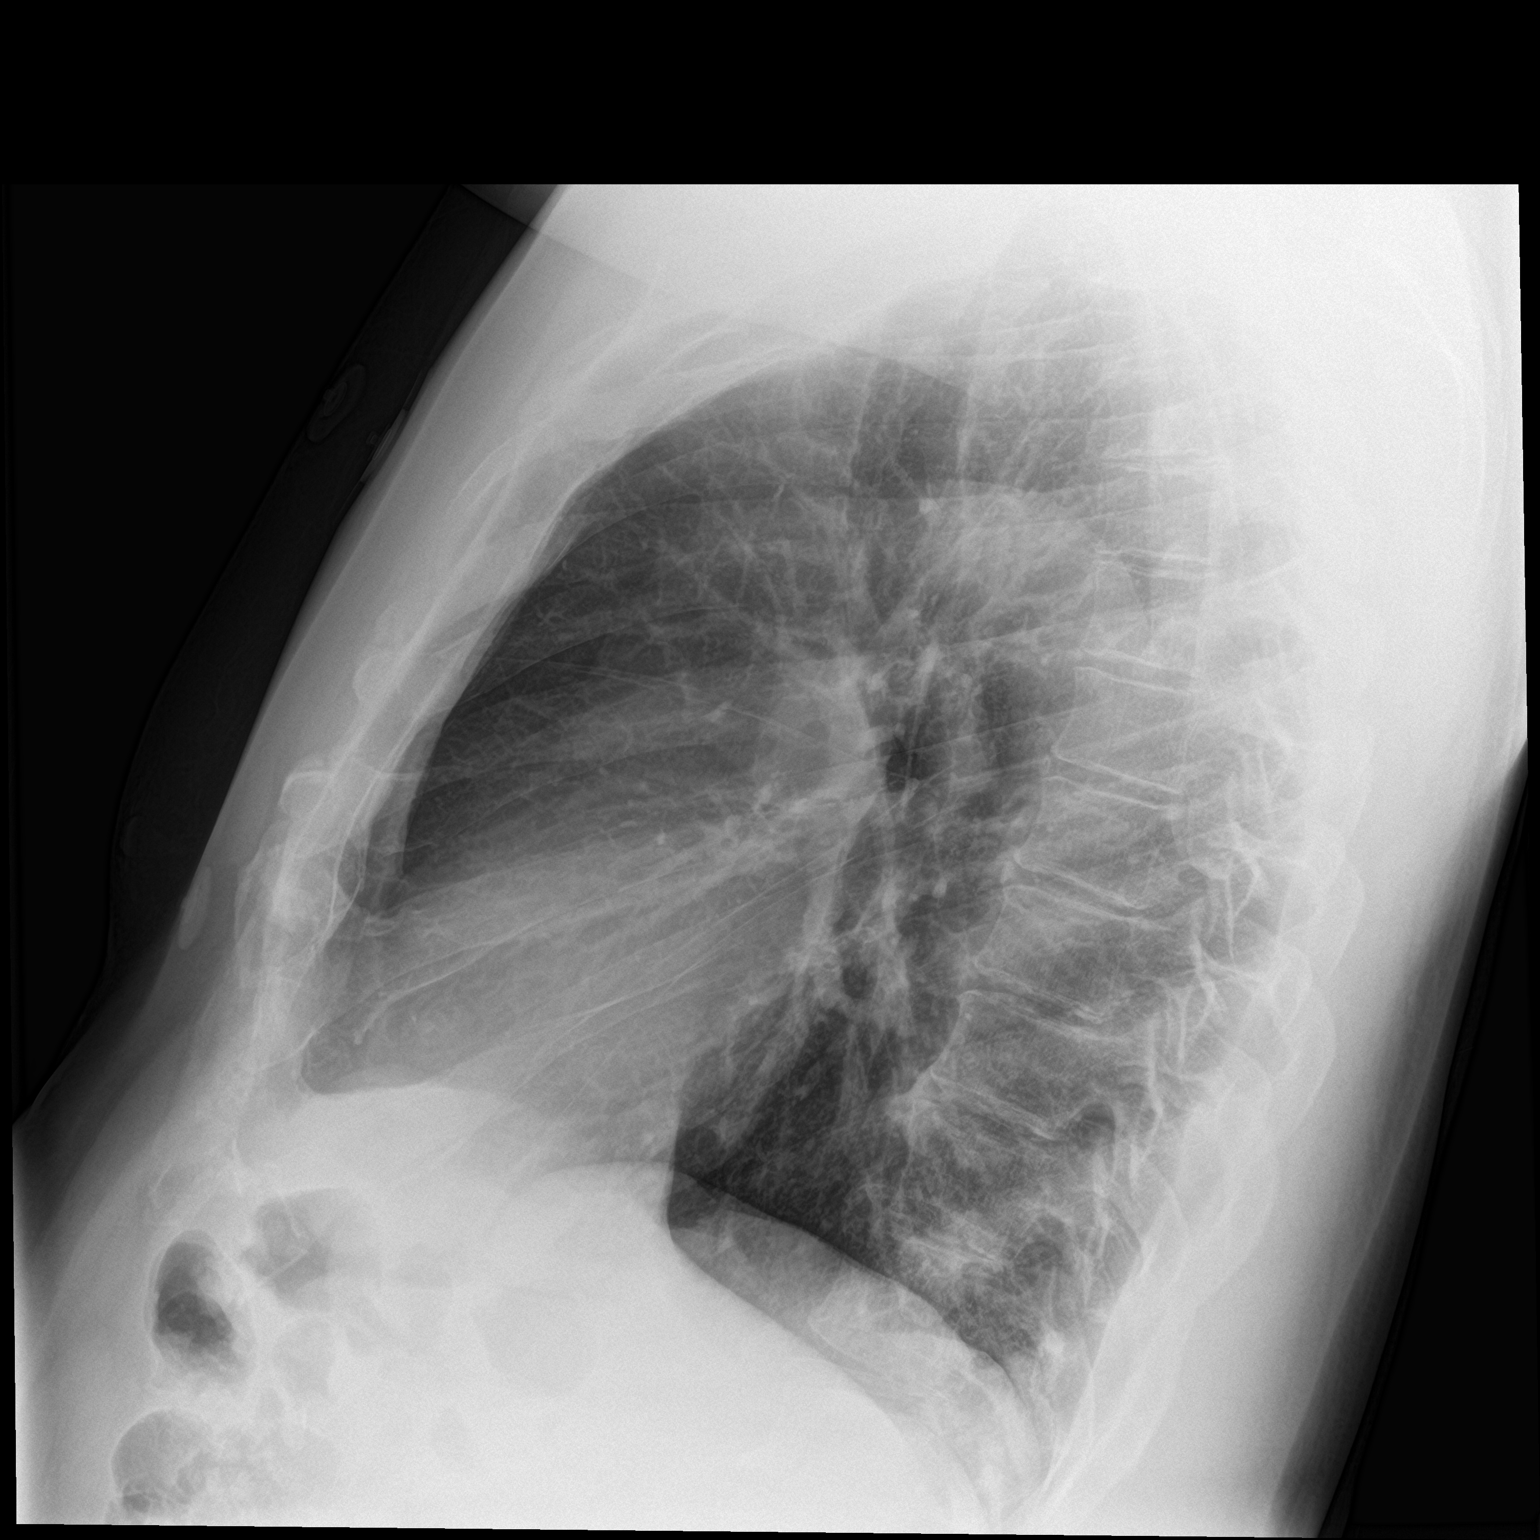

[2 of 2 positions shown; findings below may reference images not displayed]

FINDINGS: Lungs are clear. Heart size and pulmonary vascularity are normal. No
adenopathy. No pneumothorax. There is mild degenerative change in
the thoracic spine.
IMPRESSION: Lungs clear.  Cardiac silhouette within normal limits.

## 2022-04-12 DIAGNOSIS — L309 Dermatitis, unspecified: Secondary | ICD-10-CM | POA: Diagnosis not present

## 2022-04-12 DIAGNOSIS — I872 Venous insufficiency (chronic) (peripheral): Secondary | ICD-10-CM | POA: Diagnosis not present

## 2022-05-05 ENCOUNTER — Other Ambulatory Visit: Payer: Self-pay | Admitting: *Deleted

## 2022-05-05 NOTE — Patient Outreach (Signed)
[facility]) Care Management Telephonic RN Care Manager Note   05/25/2022 Name:  Robert Chan MRN:  [id number] DOB:  14-May-1956  Summary: Pt returned a call to RN CM He reports he has been doing well  He goes back to gym soon He spent time with his daughter and her family recently but limits his time He is not driving now  He will be attending visits to his pcp and podiatry  next week  Voice understanding of RN CM case closure and pending outreach from another pending [facility] RN CM   Recommendations/Changes made from today's visit: Completed assessment with case closure Discussed possible future outreach from [place] pod RN CM     Subjective: Robert Chan is an 66 y.o. year old male who is a primary patient of Elsie Stain, MD. The care management team was consulted for assistance with care management and/or care coordination needs.    Telephonic RN Care Manager completed Telephone Visit today.   Objective:  Medications Reviewed Today     Reviewed by Emmaline Life, NP (Nurse Practitioner) on 05/17/22 at 1057  Med List Status: <None>   Medication Order Taking? Sig Documenting Provider Last Dose Status Informant  albuterol (VENTOLIN HFA) 108 (90 Base) MCG/ACT inhaler [id number] Yes Inhale 1-2 puffs into the lungs every 6 (six) hours as needed for wheezing or shortness of breath. Elsie Stain, MD Taking Active   amLODipine (NORVASC) 10 MG tablet [id number] Yes Take 1 tablet (10 mg total) by mouth daily. Elsie Stain, MD Taking Active   ARIPiprazole (ABILIFY) 10 MG tablet [id number] Yes Take by mouth. [provider] Taking Active   aspirin 81 MG EC tablet [id number] Yes Take 1 tablet (81 mg total) by mouth daily. Charlott Rakes, MD Taking Active   atorvastatin (LIPITOR) 40 MG tablet [id number] Yes Take 1 tablet (40 mg total) by mouth daily. Elsie Stain, MD Taking Active   diltiazem [facility]) 240 MG 24 hr capsule [id number] Yes  Take 1 capsule (240 mg total) by mouth daily. Elsie Stain, MD Taking Active   latanoprost (XALATAN) 0.005 % ophthalmic solution [id number] Yes Place 1 drop into both eyes nightly. Elsie Stain, MD Taking Active   SYMBICORT 160-4.5 MCG/ACT inhaler [id number] Yes Inhale 2 puffs into the lungs in the morning and at bedtime. Elsie Stain, MD Taking Active   valsartan (DIOVAN) 160 MG tablet [id number] Yes Take 1 tablet (160 mg total) by mouth daily. Elsie Stain, MD Taking Active              SDOH:  (Social Determinants of Health) assessments and interventions performed:    Care Plan  Review of patient past medical history, allergies, medications, health status, including review of consultants reports, laboratory and other test data, was performed as part of comprehensive evaluation for care management services.   Care Plan : RN CM plan of care  Updates made by Barbaraann Faster, RN since 05/25/2022 12:00 AM     Problem: Complex Care Coordination Needs and disease management in patient with HTN, CAD Resolved 05/05/2022  Priority: High     Long-Range Goal: Establish Plan of Care for Management Complex SDOH Barriers, disease management and Care Coordination Needs in patient with HTN, CAD Completed 05/05/2022  Start Date: 09/07/2021  This Visit's Progress: On track  Recent Progress: On track  Priority: High  Note:   Current Barriers:  Knowledge Deficits related to plan of care for management of CAD,  HTN, and atopic dermatitis  Care Coordination needs related to Limited education about CAD, HTN, atopic dermatitis* Barriers, health behaviors, knowledge, some difficulty filling out medical forms 12/09/21 interest in starting mail order services vis [employer], diltiazem fill [facility]- denies need for PCS at this time, remains with same podiatrist & dermatologist 12/18/21 he confirms the BP monitor he obtained is not "the right one" and he will need to purchase another or  exchange it with his [employer] benefit, but his home health RN has taken BP  Working on weight management with pcp office 03/31/22 Unsuccessful outreaches 03/17/22 03/26/22   RN CM Clinical Goal(s):  Patient will verbalize understanding of plan for management of CAD, HTN, and atopic dermatitis as evidenced by improvement in BP values and healing of skin   through collaboration with RN Care manager, provider, and care team.   Interventions: Review of EPIC chart, appointments follow up outreaches for worsening symptoms, care coordination needs and disease management/education needs Inter-disciplinary care team collaboration (see longitudinal plan of care) Evaluation of current treatment plan related to  self management and patient's adherence to plan as established by provider 11/05/21 various in network dermatologists for his insurance plan provided for him to outreach to. Encouraged him to review his [employer] benefits for OTC, transportation , dental, fitness, vision, etc for 2023  12/09/21 various calls to [place], pcp/pcp pharmacy related to medication mail order, diltiazem 12/18/21 Assessed for worsening symptoms Followed up on medicine concerns Completed medication adherence assessment  Reviewed the purpose of valsartan (treat high blood pressure, heart failure, and diabetic kidney disease) and diltiazem ( treat high blood pressure, angina, and certain heart arrhythmias) to include differences and importance related to his 2017 dx HTN  & CAD Reviewed importance of checking BP at home  Encouraged lifestyle changes to assist with possible discontinuation of medication with assist of MD Allowed him to ventilate his feelings  With review of EmmiManager noted [name] is not reviewing the EMMI education e-mailed to him on HTN and CAD + 03/31/22 follow up with education : CAP, protein foods, exercise, colon screening, ACS, HTN, Wellness Obesity COPD- allowed him time to ventilate 05/05/22 case closure pending  further outreach from [employer] pod RN CM  Hypertension Interventions: (Status: Goal met) short term goal  05/05/22 continuing to make improvements 03/31/22 BP elevations continue Last practice recorded BP readings:  BP Readings from Last 3 Encounters:  05/17/22 110/68  05/11/22 129/81  02/02/22 (!) 147/100  Most recent eGFR/CrCl:  Lab Results  Component Value Date   EGFR 70 01/19/2022    No components found for: "CRCL"  Evaluation of current treatment plan related to hypertension self management and patient's adherence to plan as established by provider Reviewed medications with patient and discussed importance of compliance Provided assistance with obtaining home blood pressure monitor via his [employer] OTC benefit catalog; Counseled on the importance of exercise goals with target of 150 minutes per week Discussed plans with patient for ongoing care management follow up and provided patient with direct contact information for care management team Advised patient, providing education and rationale, to monitor blood pressure daily and record, calling PCP for findings outside established parameters Reviewed scheduled/upcoming provider appointments including:  Discussed complications of poorly controlled blood pressure such as heart disease, stroke, circulatory complications, vision complications, kidney impairment, sexual dysfunction Screening for signs and symptoms of depression related to chronic disease state  Assessed social determinant of health barriers 10/07/21 outreach to dermatology office to speak with staff  about his ineffective present treatment -sent patient a list of other in network dermatologist locally Attempted to assist patient with an outreach to his insurance customer service line to inquire about a possible [employer] gift card her reports he is pending. After an extensive wait time the patient agree to attempt to call back himself and confirmed the correct number listed on  his insurance card   CAD Interventions: (Status:  Goal on track:  Yes.) Long Term Goal 05/05/22 reports changes in diet  03/31/22 changes in det  Assessed understanding of CAD diagnosis Medications reviewed including medications utilized in CAD treatment plan Provided education on importance of blood pressure control in management of CAD Provided education on Importance of limiting foods high in cholesterol Counseled on importance of regular laboratory monitoring as prescribed Reviewed Importance of taking all medications as prescribed Reviewed Importance of attending all scheduled provider appointments Advised to report any changes in symptoms or exercise tolerance Screening for signs and symptoms of depression related to chronic disease state Assessed social determinant of health barriers   Atopic dermatitis/left painful callus & thickened nails  (Status:  Goal on track:  Yes.)  Short Term Goal Evaluation of current treatment plan related to  Atopic dermatitis/left painful callus & thickened nails , Limited education about Atopic dermatitis/left painful callus & thickened nails* self-management and patient's adherence to plan as established by provider. Discussed plans with patient for ongoing care management follow up and provided patient with direct contact information for care management team Evaluation of current treatment plan related to Atopic dermatitis/left painful callus & thickened nails and patient's adherence to plan as established by provider  Patient Goals/Self-Care Activities: Patient will self administer medications as prescribed Patient will attend all scheduled provider appointments Patient will continue to perform ADL's independently Patient will call provider office for new concerns or questions Obtain a correct BP cuff to monitor at home as ordered by MD He will obtain a note book to write down each of his providers with assistance of [facility] services for  any MI symptoms   Follow Up Plan:  The patient has been provided with contact information for the care management team and has been advised to call with any health related questions or concerns.  patient will be participating with another [facility] care management coordinator in the future [facility] pod        Plan: The patient has been provided with contact information for the care management team and has been advised to call with any health related questions or concerns.  Case closure with pending future outreach from [place]. Lavina Hamman, RN, BSN, [place] Coordinator Office number [phone] Main [facility] number [phone] Fax number [phone]

## 2022-05-05 NOTE — Patient Outreach (Signed)
[facility]) Care Management Telephonic RN Care Manager Note   05/05/2022 Name:  Robert Chan MRN:  [id number] DOB:  February 13, 1956  [facility] Unsuccessful outreach Outreach attempt to the listed at the preferred outreach number in [place] No answer. [facility] RN CM left HIPAA [facility] Portability and Accountability Act) compliant voicemail message along with CM's contact info. Discussed patient will be participating with another [facility] care management coordinator in the future [facility] pod)  Subjective: Robert Chan is an 66 y.o. year old male who is a primary patient of Joya Gaskins Burnett Harry, MD. The care management team was consulted for assistance with care management and/or care coordination needs.    Telephonic RN Care Manager completed Telephone Visit today.   Objective:  Medications Reviewed Today     Reviewed by Barbaraann Faster, RN (Registered Nurse) on 03/31/22 at 1338  Med List Status: <None>   Medication Order Taking? Sig Documenting Provider Last Dose Status Informant  albuterol (VENTOLIN HFA) 108 (90 Base) MCG/ACT inhaler [id number]  Inhale 1-2 puffs into the lungs every 6 (six) hours as needed for wheezing or shortness of breath. Elsie Stain, MD  Active   amLODipine (NORVASC) 10 MG tablet [id number]  Take 1 tablet (10 mg total) by mouth daily. Elsie Stain, MD  Active   aspirin 81 MG EC tablet [id number]  Take 1 tablet (81 mg total) by mouth daily. Charlott Rakes, MD  Active   atorvastatin (LIPITOR) 40 MG tablet [id number]  Take 1 tablet (40 mg total) by mouth daily. Elsie Stain, MD  Active   diltiazem [facility]) 240 MG 24 hr capsule [id number]  Take 1 capsule (240 mg total) by mouth daily. Elsie Stain, MD  Active   latanoprost (XALATAN) 0.005 % ophthalmic solution [id number]  Place 1 drop into both eyes nightly. Elsie Stain, MD  Active   metoprolol tartrate (LOPRESSOR) 100 MG tablet [id number]  Take 2 hours prior to Cardiac  CT Tommie Divon, NP  Active   sulfamethoxazole-trimethoprim (BACTRIM DS) 800-160 MG tablet [id number]  Take 1 tablet by mouth 2 (two) times daily. [provider]  Active   SYMBICORT 160-4.5 MCG/ACT inhaler [id number]  Inhale 2 puffs into the lungs in the morning and at bedtime. Elsie Stain, MD  Active   valsartan (DIOVAN) 160 MG tablet [id number]  Take 1 tablet (160 mg total) by mouth daily. Elsie Stain, MD  Active   Vitamin D, Ergocalciferol, (DRISDOL) 1.25 MG (50000 UNIT) CAPS capsule [id number]  Take 1 capsule (50,000 Units total) by mouth once a week. Elsie Stain, MD  Active              SDOH:  (Social Determinants of Health) assessments and interventions performed:    Care Plan  Review of patient past medical history, allergies, medications, health status, including review of consultants reports, laboratory and other test data, was performed as part of comprehensive evaluation for care management services.   There are no care plans that you recently modified to display for this patient.    Plan: The patient has been provided with contact information for the care management team and has been advised to call with any health related questions or concerns.  Case closure pending patient will be participating with another [facility] care management coordinator in the future ([place]. Lavina Hamman, RN, BSN, [place] Coordinator Office number [phone] [facility] number [phone]  Fax number [phone]

## 2022-05-06 ENCOUNTER — Other Ambulatory Visit: Payer: Self-pay | Admitting: *Deleted

## 2022-05-06 NOTE — Patient Outreach (Signed)
[facility]) Care Management  05/06/2022  Youssef Footman 11-May-1956 [id number]   [place] coordination  Mr Hettich returned a call to RN CM  His questions were answered about [facility] re assignment and a his pending new phone number He was encouraged to update his number with his pcp and it would be updated for the [facility] EPIC system   Plan Case closure was completed recently The patient has been provided with contact information for the care management team and has been advised to call with any health related questions or concerns.  Case closure with pending future outreach from [place]. Lavina Hamman, RN, BSN, [place] Coordinator Office number [phone]

## 2022-05-11 ENCOUNTER — Encounter: Payer: Self-pay | Admitting: Critical Care Medicine

## 2022-05-11 ENCOUNTER — Ambulatory Visit: Payer: Medicare HMO | Attending: Critical Care Medicine | Admitting: Critical Care Medicine

## 2022-05-11 VITALS — BP 129/81 | HR 70 | Wt 289.2 lb

## 2022-05-11 DIAGNOSIS — I2584 Coronary atherosclerosis due to calcified coronary lesion: Secondary | ICD-10-CM | POA: Diagnosis not present

## 2022-05-11 DIAGNOSIS — Z1211 Encounter for screening for malignant neoplasm of colon: Secondary | ICD-10-CM

## 2022-05-11 DIAGNOSIS — J449 Chronic obstructive pulmonary disease, unspecified: Secondary | ICD-10-CM

## 2022-05-11 DIAGNOSIS — L84 Corns and callosities: Secondary | ICD-10-CM

## 2022-05-11 DIAGNOSIS — I2510 Atherosclerotic heart disease of native coronary artery without angina pectoris: Secondary | ICD-10-CM

## 2022-05-11 DIAGNOSIS — I10 Essential (primary) hypertension: Secondary | ICD-10-CM

## 2022-05-11 NOTE — Progress Notes (Signed)
Established Patient Office Visit  Subjective:  Patient ID: Robert Chan, male    DOB: [birth year]  Age: 66 y.o. MRN: [id number]  CC:  Chief Complaint  Patient presents with   Medication Refill    Albuterol    Abdominal Pain    HPI 12/2021 Robert Chan presents for primary care follow-up.  The patient has been compliant with the valsartan and amlodipine but ran out of the blood pressure medicine about a week ago.  On arrival blood pressure is actually quite good 138/80.  Patient has no other real complaints at this time.  He did not process his Cologuard kit was sent another when he has yet to reprocess it.  The initial kit did not have sufficient samples.  Patient does agree to receive a Prevnar 20 vaccine at this visit. The patient continues with following a smoke-free habit.  His diet still is variable.  He has no other real complaints at this visit.  05/2022 Patient is seen in return follow-up he has undergone cardiac evaluations and found to have mild coronary artery disease on coronary CT scanning but segmental wall motion abnormalities on echocardiogram.  He is still having active chest pain at times.  The pain goes to the left upper quadrant area as well as to the parasternal areas.  On arrival blood pressure is good 129/81.  He was not able to process the Cologuard he agrees to get a colonoscopy.  He does not have an established follow-up with cardiology after the imaging studies were done and April and March of this year.    Past Medical History:  Diagnosis Date   Acute bilateral low back pain without sciatica 02/27/2021   Asthma    CAD (coronary artery disease)    Cataract    CKD (chronic kidney disease)    Cocaine use disorder, mild, abuse (HCC)    COPD (chronic obstructive pulmonary disease) (HCC)    Coronary vasospasm ([place]) 10/07/2020   Depression    Elevated serum creatinine 08/28/2019   Homelessness 06/19/2020   Hypertension    NSTEMI (non-ST elevated myocardial  infarction) [facility])    Status post incision and drainage 04/22/2021   Suicidal ideation 06/05/2020    Past Surgical History:  Procedure Laterality Date   APPENDECTOMY     EYE SURGERY     LEFT HEART CATH AND CORONARY ANGIOGRAPHY N/A 06/05/2020   Procedure: LEFT HEART CATH AND CORONARY ANGIOGRAPHY;  Surgeon: Nigel Mormon, MD;  Location: [place] [facility];  Service: Cardiovascular;  Laterality: N/A;   PROSTATE SURGERY      Family History  Problem Relation Age of Onset   Hypertension Mother    Diabetes Mother    Hypertension Father    Hypertension Sister     Social History   Socioeconomic History   Marital status: Single    Spouse name: Not on file   Number of children: Not on file   Years of education: Not on file   Highest education level: Not on file  Occupational History   Not on file  Tobacco Use   Smoking status: Former    Types: Cigarettes    Quit date: 12/25/2020    Years since quitting: 1.3   Smokeless tobacco: Never   Tobacco comments:    smokes 3-4 cigarettes/day  Vaping Use   Vaping Use: Never used  Substance and Sexual Activity   Alcohol use: Yes    Comment: drinks 2x/wk, 2-3 40 oz drinks, last drink Friday  Drug use: Yes    Types: Cocaine    Comment: cocaine use once a month, last use Friday    Sexual activity: Not Currently  Other Topics Concern   Not on file  Social History Narrative   His mother passed at age 17 (when he was 76 years old) and his father raised him and his siblings   His parents were pastors as a lot of his sisters and other family members are today   There was a total of 13 children in his family Had 51 sisters, one sister deceased , Had 3 brothers, all brothers deceased   He is the youngest of the 1 and was "always in trouble in my younger years"   Family still live in  [place], [place], some in [place], [place]   Has a Daughter and son with a total of 10 grandchildren (75 grand daughters local and 15 grandsons)    Values family    Married twice, present wife incarcerated   Social Determinants of Health   Financial Resource Strain: [place]  (03/31/2022)   Overall Financial Resource Strain (CARDIA)    Difficulty of Paying Living Expenses: Not hard at all  Food Insecurity: No Food Insecurity (03/31/2022)   Hunger Vital Sign    Worried About Running Out of Food in the Last Year: Never true    [place] in the Last Year: Never true  Transportation Needs: No Transportation Needs (03/31/2022)   PRAPARE - Hydrologist (Medical): No    Lack of Transportation (Non-Medical): No  Physical Activity: Not on file  Stress: No Stress Concern Present (03/31/2022)   [place]    Feeling of Stress : Only a little  Social Connections: Moderately Integrated (03/31/2022)   Social Connection and Isolation Panel [NHANES]    Frequency of Communication with Friends and Family: Three times a week    Frequency of Social Gatherings with Friends and Family: Three times a week    Attends Religious Services: 1 to 4 times per year    Active Member of Clubs or Organizations: Yes    Attends Archivist Meetings: 1 to 4 times per year    Marital Status: Never married  Intimate Partner Violence: Not At Risk (03/31/2022)   Humiliation, Afraid, Rape, and Kick questionnaire    Fear of Current or Ex-Partner: No    Emotionally Abused: No    Physically Abused: No    Sexually Abused: No    Outpatient Medications Prior to Visit  Medication Sig Dispense Refill   albuterol (VENTOLIN HFA) 108 (90 Base) MCG/ACT inhaler Inhale 1-2 puffs into the lungs every 6 (six) hours as needed for wheezing or shortness of breath. 90 g 2   amLODipine (NORVASC) 10 MG tablet Take 1 tablet (10 mg total) by mouth daily. 90 tablet 2   ARIPiprazole (ABILIFY) 10 MG tablet Take by mouth.     aspirin 81 MG EC tablet Take 1 tablet (81 mg total) by mouth daily. 30 tablet  6   atorvastatin (LIPITOR) 40 MG tablet Take 1 tablet (40 mg total) by mouth daily. 90 tablet 1   diltiazem (TIAZAC) 240 MG 24 hr capsule Take 1 capsule (240 mg total) by mouth daily. 90 capsule 2   latanoprost (XALATAN) 0.005 % ophthalmic solution Place 1 drop into both eyes nightly. 2.5 mL 3   SYMBICORT 160-4.5 MCG/ACT inhaler Inhale 2 puffs into the lungs  in the morning and at bedtime. 10.2 g 11   valsartan (DIOVAN) 160 MG tablet Take 1 tablet (160 mg total) by mouth daily. 90 tablet 3   Vitamin D, Ergocalciferol, (DRISDOL) 1.25 MG (50000 UNIT) CAPS capsule Take 1 capsule (50,000 Units total) by mouth once a week. 12 capsule 1   atorvastatin (LIPITOR) 20 MG tablet Take 1 tablet by mouth daily.     doxycycline (VIBRAMYCIN) 100 MG capsule Take 100 mg by mouth 2 (two) times daily.     metoprolol tartrate (LOPRESSOR) 100 MG tablet Take 2 hours prior to Cardiac CT 1 tablet 0   sulfamethoxazole-trimethoprim (BACTRIM DS) 800-160 MG tablet Take 1 tablet by mouth 2 (two) times daily.     No facility-administered medications prior to visit.    Allergies  Allergen Reactions   Shellfish Allergy Hives and Rash   Shellfish-Derived Products Rash    Other reaction(s): Other (See Comments)    ROS Review of Systems  Constitutional: Negative.   HENT: Negative.  Negative for ear pain, postnasal drip, rhinorrhea, sinus pressure, sore throat, trouble swallowing and voice change.   Eyes: Negative.   Respiratory: Negative.  Negative for apnea, cough, choking, chest tightness, shortness of breath, wheezing and stridor.   Cardiovascular:  Positive for chest pain. Negative for palpitations and leg swelling.  Gastrointestinal: Negative.  Negative for abdominal distention, abdominal pain, nausea and vomiting.  Genitourinary: Negative.   Musculoskeletal: Negative.  Negative for arthralgias and myalgias.  Skin: Negative.  Negative for rash.  Allergic/Immunologic: Negative.  Negative for environmental allergies  and food allergies.  Neurological: Negative.  Negative for dizziness, syncope, weakness and headaches.  Hematological: Negative.  Negative for adenopathy. Does not bruise/bleed easily.  Psychiatric/Behavioral: Negative.  Negative for agitation, sleep disturbance and suicidal ideas. The patient is not nervous/anxious.       Objective:    Physical Exam Vitals reviewed.  Constitutional:      Appearance: Normal appearance. He is well-developed. He is obese. He is not diaphoretic.  HENT:     Head: Normocephalic and atraumatic.     Nose: No nasal deformity, septal deviation, mucosal edema or rhinorrhea.     Right Sinus: No maxillary sinus tenderness or frontal sinus tenderness.     Left Sinus: No maxillary sinus tenderness or frontal sinus tenderness.     Mouth/Throat:     Pharynx: No oropharyngeal exudate.  Eyes:     General: No scleral icterus.    Conjunctiva/sclera: Conjunctivae normal.     Pupils: Pupils are equal, round, and reactive to light.  Neck:     Thyroid: No thyromegaly.     Vascular: No carotid bruit or JVD.     Trachea: Trachea normal. No tracheal tenderness or tracheal deviation.  Cardiovascular:     Rate and Rhythm: Normal rate and regular rhythm.     Chest Wall: PMI is not displaced.     Pulses: Normal pulses. No decreased pulses.     Heart sounds: Normal heart sounds, S1 normal and S2 normal. Heart sounds not distant. No murmur heard.    No systolic murmur is present.     No diastolic murmur is present.     No friction rub. No gallop. No S3 or S4 sounds.  Pulmonary:     Effort: Pulmonary effort is normal. No tachypnea, accessory muscle usage or respiratory distress.     Breath sounds: Normal breath sounds. No stridor. No decreased breath sounds, wheezing, rhonchi or rales.  Chest:  Chest wall: No tenderness.  Abdominal:     General: Bowel sounds are normal. There is no distension.     Palpations: Abdomen is soft. Abdomen is not rigid.     Tenderness: There  is no abdominal tenderness. There is no guarding or rebound.  Musculoskeletal:        General: Normal range of motion.     Cervical back: Normal range of motion and neck supple. No edema, erythema or rigidity. No muscular tenderness. Normal range of motion.  Lymphadenopathy:     Head:     Right side of head: No submental or submandibular adenopathy.     Left side of head: No submental or submandibular adenopathy.     Cervical: No cervical adenopathy.  Skin:    General: Skin is warm and dry.     Coloration: Skin is not pale.     Findings: No rash.     Nails: There is no clubbing.  Neurological:     Mental Status: He is alert and oriented to person, place, and time.     Sensory: No sensory deficit.  Psychiatric:        Mood and Affect: Mood normal.        Speech: Speech normal.        Behavior: Behavior normal.     BP 129/81   Pulse 70   Wt 289 lb 3.2 oz (131.2 kg)   SpO2 97%   BMI 35.20 kg/m  Wt Readings from Last 3 Encounters:  05/11/22 289 lb 3.2 oz (131.2 kg)  01/19/22 (!) 300 lb 8 oz (136.3 kg)  01/11/22 (!) 304 lb (137.9 kg)     Health Maintenance Due  Topic Date Due   TETANUS/TDAP  Never done   Fecal DNA (Cologuard)  Never done   Zoster Vaccines- Shingrix (1 of 2) Never done    There are no preventive care reminders to display for this patient.  Lab Results  Component Value Date   TSH 2.640 09/09/2020   Lab Results  Component Value Date   WBC 9.3 01/19/2022   HGB 12.7 (L) 01/19/2022   HCT 36.9 (L) 01/19/2022   MCV 90 01/19/2022   PLT 257 01/19/2022   Lab Results  Component Value Date   NA 138 01/19/2022   K 4.5 01/19/2022   CO2 21 01/19/2022   GLUCOSE 103 (H) 01/19/2022   BUN 12 01/19/2022   CREATININE 1.15 01/19/2022   BILITOT 0.6 09/28/2021   ALKPHOS 78 09/28/2021   AST 25 09/28/2021   ALT 18 09/28/2021   PROT 7.5 09/28/2021   ALBUMIN 4.4 09/28/2021   CALCIUM 9.2 01/19/2022   ANIONGAP 8 04/15/2021   EGFR 70 01/19/2022   Lab Results   Component Value Date   CHOL 155 09/28/2021   Lab Results  Component Value Date   HDL 57 09/28/2021   Lab Results  Component Value Date   LDLCALC 80 09/28/2021   Lab Results  Component Value Date   TRIG 101 09/28/2021   Lab Results  Component Value Date   CHOLHDL 2.7 09/28/2021   Lab Results  Component Value Date   HGBA1C 4.8 09/07/2020      Assessment & Plan:   Problem List Items Addressed This Visit       Cardiovascular and Mediastinum   Benign hypertension    Hypertension stable at this time no change in medications      Coronary artery disease - Primary    Coronary artery disease with  abnormality seen on coronary CT scan and segmental wall motion abnormalities on echo  I am interested in cardiology's opinion he is still having chest pain at this time I will refer him back to cardiology for follow-up on the various studies that have been completed      Relevant Orders   Ambulatory referral to Cardiology     Respiratory   COPD with chronic bronchitis ([place])    Continue inhaled medications        Musculoskeletal and Integument   Foot callus    Patient has follow-up with podiatry      No orders of the defined types were placed in this encounter.   Follow-up: Return in about 4 months (around 09/11/2022).    Asencion Noble, MD

## 2022-05-11 NOTE — Assessment & Plan Note (Signed)
Patient has follow-up with podiatry

## 2022-05-11 NOTE — Assessment & Plan Note (Signed)
Coronary artery disease with abnormality seen on coronary CT scan and segmental wall motion abnormalities on echo  I am interested in cardiology's opinion he is still having chest pain at this time I will refer him back to cardiology for follow-up on the various studies that have been completed

## 2022-05-11 NOTE — Assessment & Plan Note (Signed)
Continue inhaled medications 

## 2022-05-11 NOTE — Patient Instructions (Signed)
Referral to cardiology will be made  Referral to gastroenterology for your colonoscopy will be made  No change in medications  Return to Dr. Joya Gaskins 4 months

## 2022-05-11 NOTE — Addendum Note (Signed)
Addended by: Asencion Noble E on: 05/11/2022 05:19 PM   Modules accepted: Orders

## 2022-05-11 NOTE — Assessment & Plan Note (Signed)
Hypertension stable at this time no change in medications

## 2022-05-14 ENCOUNTER — Ambulatory Visit: Payer: Medicare HMO | Admitting: Nurse Practitioner

## 2022-05-14 ENCOUNTER — Encounter: Payer: Self-pay | Admitting: Podiatry

## 2022-05-14 ENCOUNTER — Ambulatory Visit (INDEPENDENT_AMBULATORY_CARE_PROVIDER_SITE_OTHER): Payer: Medicare HMO | Admitting: Podiatry

## 2022-05-14 DIAGNOSIS — B351 Tinea unguium: Secondary | ICD-10-CM

## 2022-05-14 DIAGNOSIS — M79675 Pain in left toe(s): Secondary | ICD-10-CM

## 2022-05-14 DIAGNOSIS — I739 Peripheral vascular disease, unspecified: Secondary | ICD-10-CM

## 2022-05-14 DIAGNOSIS — Q828 Other specified congenital malformations of skin: Secondary | ICD-10-CM

## 2022-05-14 DIAGNOSIS — M79674 Pain in right toe(s): Secondary | ICD-10-CM

## 2022-05-16 NOTE — Progress Notes (Unsigned)
Cardiology Office Note:    Date:  05/17/2022   ID:  Robert Chan, DOB 04/06/1956, MRN [id number]  PCP:  Elsie Stain, MD   [facility] Providers Cardiologist:  [name], MD     Referring MD: Elsie Stain, MD   Chief Complaint: follow-up chest pain  History of Present Illness:    Robert Chan is a  66 y.o. male with a hx of CAD, CKD, COPD, hypertension, and depression.   He presented in 06/2020 with NSTEMI and underwent cardiac catheterization which revealed 30% proximal LAD and mid LAD myocardial bridge and 20% mid RCA with normal LVEF and normal LVEDP.  Echocardiogram showed normal LVEF with G1 DD and no valvular disease.  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  He was then referred to Dr. Radford Pax to establish cardiac care in 12/2020.  Noted to have chronic DOE related to COPD and asthma.  Also had complaints of intermittent chest pressure a few times a week.  He was having issues with LE edema although admitted to adding salt and eating bacon sausage and chips.  He was set up for [employer] stress test 01/08/2021 that showed no ischemia or infarct.  Echocardiogram was mention but appears it was never scheduled.  Advised to continue aspirin and statin.  He was last seen in our office on 01/19/22 by Kathyrn Drown, NP for evaluation of chest pain.  Reported that over the previous 1 to 2 months he was experiencing chest pain while on daily walks. Occurs mostly every time he is walking for exercise.  Pain is central with no radiation. Also has extreme fatigue which is bothersome.  No HF symptoms such as orthopnea, LE edema, or dyspnea.  He reported he no longer used cocaine.  Echocardiogram was ordered which revealed LVEF 55 to 72%, normal diastolic parameters, mild hypokinesis of LV, mid inferior septal wall and inferior wall.  Normal RV.  Changes from prior study include mid inferior septal and inferior wall with mild HK.   Coronary CTA revealed mild nonobstructive CAD, calcium score of 589, 93rd percentile for age/sex.  He was advised to continue current medications and follow-up in 4 weeks.  Today, he is here alone for follow-up. States pain has improved, although he still has some discomfort. It does not occur primarily with exertion, also occurs at rest. Has an occasional "funny feeling" like a palpation. Notes more symptoms including shortness of breath when walking up a steep incline, feels better coming down hill. He denies lower extremity edema, fatigue, diaphoresis, weakness, presyncope, syncope, orthopnea, and PND.  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   Depression    Elevated serum creatinine 08/28/2019   Homelessness 06/19/2020   Hypertension    NSTEMI (non-ST elevated myocardial infarction) [facility])    Status post incision and drainage 04/22/2021   Suicidal ideation 06/05/2020    Past Surgical History:  Procedure Laterality Date   APPENDECTOMY     EYE SURGERY     LEFT HEART CATH AND CORONARY ANGIOGRAPHY N/A 06/05/2020   Procedure: LEFT HEART CATH AND CORONARY ANGIOGRAPHY;  Surgeon: Nigel Mormon, MD;  Location: [facility];  Service: Cardiovascular;  Laterality: N/A;   PROSTATE SURGERY      Current Medications: Current Meds  Medication Sig   albuterol (VENTOLIN HFA) 108 (90 Base) MCG/ACT inhaler Inhale 1-2 puffs into the lungs every 6 (six) hours as needed for wheezing or shortness of breath.   amLODipine (NORVASC) 10 MG tablet Take 1 tablet (10 mg total) by mouth daily.   ARIPiprazole (ABILIFY) 10 MG tablet Take by mouth.   aspirin 81 MG EC tablet Take 1 tablet (81 mg total) by mouth daily.    atorvastatin (LIPITOR) 40 MG tablet Take 1 tablet (40 mg total) by mouth daily.   diltiazem (TIAZAC) 240 MG 24 hr capsule Take 1 capsule (240 mg total) by mouth daily.   latanoprost (XALATAN) 0.005 % ophthalmic solution Place 1 drop into both eyes nightly.   SYMBICORT 160-4.5 MCG/ACT inhaler Inhale 2 puffs into the lungs in the morning and at bedtime.   valsartan (DIOVAN) 160 MG tablet Take 1 tablet (160 mg total) by mouth daily.   [DISCONTINUED] Vitamin D, Ergocalciferol, (DRISDOL) 1.25 MG (50000 UNIT) CAPS capsule Take 1 capsule (50,000 Units total) by mouth once a week.     Allergies:   Shellfish allergy and Shellfish-derived products   Social History   Socioeconomic History   Marital status: Single    Spouse name: Not on file   Number of children: Not on file   Years of education: Not on file   Highest education level: Not on file  Occupational History   Not on file  Tobacco Use   Smoking status: Former    Types: Cigarettes    Quit date: 12/25/2020    Years since quitting: 1.3   Smokeless tobacco: Never   Tobacco comments:    smokes 3-4 cigarettes/day  Vaping Use   Vaping Use: Never used  Substance and Sexual Activity   Alcohol use: Yes    Comment: drinks 2x/wk, 2-3 40 oz drinks, last drink Friday   Drug use: Yes    Types: Cocaine    Comment: cocaine use once a month, last use Friday    Sexual activity: Not Currently  Other Topics Concern   Not on file  Social History Narrative   His mother passed at age 64 (when he was 43 years old) and his father raised him and his siblings   His parents were pastors as a lot of his sisters and other family members are today   There was a total of 13 children in his family Had 74 sisters, one sister deceased , Had 3 brothers, all brothers deceased   He is the youngest of the 34 and was "always in trouble in my younger years"   Family still live in [place], [place], some in [place], [place]   Has a Daughter and son with a total of 10  grandchildren (21 grand daughters local and 22 grandsons)    Values family   Married twice, present wife incarcerated   Social Determinants of Health   Financial Resource Strain: [place]  (03/31/2022)   Overall Financial Resource Strain (CARDIA)    Difficulty of Paying Living Expenses: Not hard at all  Food Insecurity: No Food Insecurity (03/31/2022)   Hunger Vital Sign    Worried About Running Out of Food in the Last Year: Never true    [place] in the Last Year: Never true  Transportation Needs: No Transportation Needs (03/31/2022)   [place] -  Hydrologist (Medical): No    Lack of Transportation (Non-Medical): No  Physical Activity: Not on file  Stress: No Stress Concern Present (03/31/2022)   [place]    Feeling of Stress : Only a little  Social Connections: Moderately Integrated (03/31/2022)   Social Connection and Isolation Panel [NHANES]    Frequency of Communication with Friends and Family: Three times a week    Frequency of Social Gatherings with Friends and Family: Three times a week    Attends Religious Services: 1 to 4 times per year    Active Member of Clubs or Organizations: Yes    Attends Archivist Meetings: 1 to 4 times per year    Marital Status: Never married     Family History: The patient's family history includes Diabetes in his mother; Hypertension in his father, mother, and sister.  ROS:   Please see the history of present illness.  + chest pain + DOE All other systems reviewed and are negative.  Labs/Other Studies Reviewed:    The following studies were reviewed today:  CCTA 02/02/22  1.  Mild non-obstructive CAD, CADRADS = 2.   2. Coronary calcium score of 589. This was 93rd percentile for age and sex matched control.   3. Normal coronary origin with right dominance.   Echo 01/22/22  1. Left ventricular ejection fraction, by  estimation, is 55 to 60%. The  left ventricle has normal function. The left ventricle demonstrates  regional wall motion abnormalities (see scoring diagram/findings for  description). Left ventricular diastolic  parameters were normal. There is mild hypokinesis of the left ventricular,  mid inferoseptal wall and inferior wall.   2. Right ventricular systolic function is normal. The right ventricular  size is normal.   3. The mitral valve is normal in structure. Trivial mitral valve  regurgitation. No evidence of mitral stenosis.   4. The aortic valve is grossly normal. Aortic valve regurgitation is not  visualized. No aortic stenosis is present.   5. The inferior vena cava is normal in size with greater than 50%  respiratory variability, suggesting right atrial pressure of 3 mmHg.   Comparison(s): Prior images reviewed side by side. Changes from prior  study are noted. Mid inferoseptal and inferior walls with mild  hypokinesis; change from prior.   Lexiscan myoview 01/08/21  There was no ST segment deviation noted during stress. Nuclear stress EF: 50%. The left ventricular ejection fraction is mildly decreased (45-54%). The study is normal. This is a low risk study.   1. Fixed perfusion defect in apical inferior wall and apex with normal wall motion, suggests artifact 2. Low risk study  LHC 06/05/20  LM: Normal LAD: Prox 30% stenosis. Mid LAD myocardial bridge        Slow flow improved with IC NTG LCx: Normal RCA: Mid 20% disease   Mild nonobstructive CAD Chest pain and trop elevation due to cocaine induced vasospasm  Recent Labs: 09/28/2021: ALT 18 01/19/2022: BUN 12; Creatinine, Ser 1.15; Hemoglobin 12.7; Platelets 257; Potassium 4.5; Sodium 138  Recent Lipid Panel    Component Value Date/Time   CHOL 155 09/28/2021 1514   TRIG 101 09/28/2021 1514   HDL 57 09/28/2021 1514   CHOLHDL 2.7 09/28/2021 1514   CHOLHDL 2.4 09/09/2020 0654   VLDL 15 09/09/2020 0654   LDLCALC  80 09/28/2021 1514     Risk Assessment/Calculations:       Physical  Exam:    VS:  BP 110/68   Pulse 70   Ht '6\' 4"'$  (1.93 m)   Wt 283 lb (128.4 kg)   SpO2 98%   BMI 34.45 kg/m     Wt Readings from Last 3 Encounters:  05/17/22 283 lb (128.4 kg)  05/11/22 289 lb 3.2 oz (131.2 kg)  01/19/22 (!) 300 lb 8 oz (136.3 kg)     GEN: Well developed, obese male in no acute distress HEENT: Normal NECK: No JVD; No carotid bruits CARDIAC: RRR, no murmurs, rubs, gallops RESPIRATORY:  Diminished breath sounds bilaterally without rales, wheezing or rhonchi  ABDOMEN: Soft, non-tender, non-distended MUSCULOSKELETAL:  No edema; No deformity. 2+ pedal pulses, equal bilaterally SKIN: Warm and dry NEUROLOGIC:  Alert and oriented x 3 PSYCHIATRIC:  Flat affect   EKG:  EKG is ordered today.  The ekg ordered today demonstrates sinus rhythm with 1st degree AV block, low voltage QRS, LAFB, nonspecific T wave abnormality, no acute change from previous  Diagnoses:    1. Essential hypertension   2. Hyperlipidemia LDL goal <70   3. Coronary artery disease involving native coronary artery of native heart without angina pectoris   4. Precordial pain   5. Chronic obstructive pulmonary disease, unspecified COPD type (HCC)   6. Elevated coronary artery calcium score    Assessment and Plan:     Chest pain: He reports chest pain has improved since last office visit, although he continues to feel an occasional pain. It is not specific to exertion or rest. Also notes occasional palpitations and DOE, particularly when going up an incline. He continues to walk daily without significant symptoms. No significant findings on echo, normal LVEF. Advised him to notify us if symptoms worsen prior to next office visit.   CAD without angina: As noted above, he has occasional chest pain. Occurs both with exertion and at rest. History of NSTEMI with nonobstructive CAD by cardiac catheterization 06/2020 and again  nonobstructive disease by CTA 01/2022. Emphasized the importance of good cholesterol control. We will recheck lipids today. Advised continued exercise and heart healthy diet.  Continue amlodipine, aspirin, valsartan, atorvastatin.  Hyperlipidemia LDL goal < 70/Elevated coronary calcium score: Coronary calcium score 589, 93rd percentile for age/sex.  LDL 80 on 09/28/21. We will recheck today. Continue atorvastatin.    COPD: He feels that some of his chest pain may be caused by COPD.  He continues to exercise without significant symptoms. Management per PCP.   Disposition: 1 year with Dr. Radford Pax  Medication Adjustments/Labs and Tests Ordered: Current medicines are reviewed at length with the patient today.  Concerns regarding medicines are outlined above.  Orders Placed This Encounter  Procedures   Hepatic function panel   Lipid Profile   EKG 12-Lead   No orders of the defined types were placed in this encounter.   Patient Instructions  Medication Instructions:   Your physician recommends that you continue on your current medications as directed. Please refer to the Current Medication list given to you today.   *If you need a refill on your cardiac medications before your next appointment, please call your pharmacy*   Lab Work:  TODAY!!!!! LFT/LIPID  If you have labs (blood work) drawn today and your tests are completely normal, you will receive your results only by: [place] (if you have MyChart) OR A paper copy in the mail If you have any lab test that is abnormal or we need to change your treatment, we will call you  to review the results.   Testing/Procedures:  None ordered.   Follow-Up: At [facility], you and your health needs are our priority.  As part of our continuing mission to provide you with exceptional heart care, we have created designated Provider Care Teams.  These Care Teams include your primary Cardiologist (physician) and Advanced Practice  Providers (APPs -  Physician Assistants and Nurse Practitioners) who all work together to provide you with the care you need, when you need it.  We recommend signing up for the patient portal called "MyChart".  Sign up information is provided on this After Visit Summary.  MyChart is used to connect with patients for Virtual Visits (Telemedicine).  Patients are able to view lab/test results, encounter notes, upcoming appointments, etc.  Non-urgent messages can be sent to your provider as well.   To learn more about what you can do with MyChart, go to [email or web address].    Your next appointment:   1 year(s)  The format for your next appointment:   In Person  Provider:   [name], MD     Other Instructions  Your physician wants you to follow-up in: 1 year with Dr. Radford Pax.  You will receive a reminder letter in the mail two months in advance. If you don't receive a letter, please call our office to schedule the follow-up appointment.   Important Information About Sugar         Signed, Emmaline Life, NP  05/17/2022 10:48 AM    [place]

## 2022-05-17 ENCOUNTER — Encounter: Payer: Self-pay | Admitting: Nurse Practitioner

## 2022-05-17 ENCOUNTER — Ambulatory Visit (INDEPENDENT_AMBULATORY_CARE_PROVIDER_SITE_OTHER): Payer: Medicare HMO | Admitting: Nurse Practitioner

## 2022-05-17 VITALS — BP 110/68 | HR 70 | Ht 76.0 in | Wt 283.0 lb

## 2022-05-17 DIAGNOSIS — R931 Abnormal findings on diagnostic imaging of heart and coronary circulation: Secondary | ICD-10-CM

## 2022-05-17 DIAGNOSIS — J449 Chronic obstructive pulmonary disease, unspecified: Secondary | ICD-10-CM | POA: Diagnosis not present

## 2022-05-17 DIAGNOSIS — R072 Precordial pain: Secondary | ICD-10-CM

## 2022-05-17 DIAGNOSIS — I10 Essential (primary) hypertension: Secondary | ICD-10-CM | POA: Diagnosis not present

## 2022-05-17 DIAGNOSIS — E785 Hyperlipidemia, unspecified: Secondary | ICD-10-CM

## 2022-05-17 DIAGNOSIS — I2510 Atherosclerotic heart disease of native coronary artery without angina pectoris: Secondary | ICD-10-CM

## 2022-05-17 LAB — HEPATIC FUNCTION PANEL
ALT: 35 IU/L (ref 0–44)
AST: 21 IU/L (ref 0–40)
Albumin: 3.9 g/dL (ref 3.9–4.9)
Alkaline Phosphatase: 74 IU/L (ref 44–121)
Bilirubin Total: 0.6 mg/dL (ref 0.0–1.2)
Bilirubin, Direct: 0.18 mg/dL (ref 0.00–0.40)
Total Protein: 7.2 g/dL (ref 6.0–8.5)

## 2022-05-17 LAB — LIPID PANEL
Chol/HDL Ratio: 3.3 ratio (ref 0.0–5.0)
Cholesterol, Total: 173 mg/dL (ref 100–199)
HDL: 53 mg/dL (ref >39)
LDL Chol Calc (NIH): 104 mg/dL — ABNORMAL HIGH (ref 0–99)
Triglycerides: 89 mg/dL (ref 0–149)
VLDL Cholesterol Cal: 16 mg/dL (ref 5–40)

## 2022-05-17 NOTE — Patient Instructions (Signed)
Medication Instructions:   Your physician recommends that you continue on your current medications as directed. Please refer to the Current Medication list given to you today.   *If you need a refill on your cardiac medications before your next appointment, please call your pharmacy*   Lab Work:  TODAY!!!!! LFT/LIPID  If you have labs (blood work) drawn today and your tests are completely normal, you will receive your results only by: [place] (if you have MyChart) OR A paper copy in the mail If you have any lab test that is abnormal or we need to change your treatment, we will call you to review the results.   Testing/Procedures:  None ordered.   Follow-Up: At [facility], you and your health needs are our priority.  As part of our continuing mission to provide you with exceptional heart care, we have created designated Provider Care Teams.  These Care Teams include your primary Cardiologist (physician) and Advanced Practice Providers (APPs -  Physician Assistants and Nurse Practitioners) who all work together to provide you with the care you need, when you need it.  We recommend signing up for the patient portal called "MyChart".  Sign up information is provided on this After Visit Summary.  MyChart is used to connect with patients for Virtual Visits (Telemedicine).  Patients are able to view lab/test results, encounter notes, upcoming appointments, etc.  Non-urgent messages can be sent to your provider as well.   To learn more about what you can do with MyChart, go to [email or web address].    Your next appointment:   1 year(s)  The format for your next appointment:   In Person  Provider:   [name], MD     Other Instructions  Your physician wants you to follow-up in: 1 year with Dr. Radford Pax.  You will receive a reminder letter in the mail two months in advance. If you don't receive a letter, please call our office to schedule the follow-up  appointment.   Important Information About Sugar

## 2022-05-19 NOTE — Progress Notes (Signed)
  Subjective:  Patient ID: Robert Chan, male    DOB: Dec 13, 1955,  MRN: [id number]  Robert Chan presents to clinic today for for at risk foot care. Patient has h/o PAD and callus(es) b/l lower extremities and painful thick toenails that are difficult to trim. Painful toenails interfere with ambulation. Aggravating factors include wearing enclosed shoe gear. Pain is relieved with periodic professional debridement. Painful calluses are aggravated when weightbearing with and without shoegear. Pain is relieved with periodic professional debridement.  New problem(s): None.   Patient has h/o PAD and is followed by [facility], last visit was May, 2022.  PCP is Robert Stain, MD , and last visit was  May 11, 2022  Allergies  Allergen Reactions   Shellfish Allergy Hives and Rash   Shellfish-Derived Products Rash    Other reaction(s): Other (See Comments)    Review of Systems: Negative except as noted in the HPI.  Objective:   Vascular Examination: CFT <3 seconds b/l LE. Palpable DP pulse(s) b/l LE. Nonpalpable PT pulse(s) b/l LE. Pedal hair absent. No pain with calf compression b/l. Trace edema noted BLE. No ischemia or gangrene noted b/l LE. No cyanosis or clubbing noted b/l LE.  Neurological Examination: Protective sensation intact 5/5 intact bilaterally with 10g monofilament b/l. Vibratory sensation intact b/l.  Dermatological Examination: Pedal skin is warm and supple b/l LE. No open wounds b/l LE. No interdigital macerations noted b/l LE. Toenails 1-5 bilaterally elongated, discolored, dystrophic, thickened, and crumbly with subungual debris and tenderness to dorsal palpation. Porokeratotic lesion(s) submet head 5 b/l. No erythema, no edema, no drainage, no fluctuance.  Musculoskeletal Examination: Muscle strength 5/5 to all lower extremity muscle groups bilaterally. Plantarflexed metatarsal(s) 5th metatarsal head b/l lower extremities.  Radiographs:  None  Assessment/Plan: 1. Pain due to onychomycosis of toenails of both feet   2. Porokeratosis   3. PAD (peripheral artery disease) ([place])      -Patient was evaluated and treated. All patient's and/or POA's questions/concerns answered on today's visit. -Medicaid ABN signed for services of paring of corn(s)/callus(es)/porokeratos(es) today. Copy in patient chart. -Patient to continue soft, supportive shoe gear daily. -Mycotic toenails 1-5 bilaterally were debrided in length and girth with sterile nail nippers and dremel without incident. -Porokeratotic lesion(s) submet head 5 b/l pared and enucleated with sterile scalpel blade without incident. Total number of lesions debrided=2. -Patient/POA to call should there be question/concern in the interim.   Return in about 3 months (around 08/14/2022).  Marzetta Board, DPM

## 2022-05-21 ENCOUNTER — Other Ambulatory Visit: Payer: Self-pay | Admitting: *Deleted

## 2022-05-21 DIAGNOSIS — I2510 Atherosclerotic heart disease of native coronary artery without angina pectoris: Secondary | ICD-10-CM

## 2022-05-21 DIAGNOSIS — E785 Hyperlipidemia, unspecified: Secondary | ICD-10-CM

## 2022-05-21 MED ORDER — ATORVASTATIN CALCIUM 80 MG PO TABS: 80.0000 mg | ORAL_TABLET | Freq: Every day | ORAL | 3 refills | Status: DC

## 2022-06-08 ENCOUNTER — Encounter (HOSPITAL_COMMUNITY): Payer: Self-pay | Admitting: Emergency Medicine

## 2022-06-08 ENCOUNTER — Emergency Department (HOSPITAL_COMMUNITY): Payer: Medicare HMO

## 2022-06-08 ENCOUNTER — Emergency Department (HOSPITAL_COMMUNITY)
Admission: EM | Admit: 2022-06-08 | Discharge: 2022-06-08 | Disposition: A | Discharge status: Home / Self Care | Payer: Medicare HMO | Attending: Emergency Medicine | Admitting: Emergency Medicine

## 2022-06-08 DIAGNOSIS — J45909 Unspecified asthma, uncomplicated: Secondary | ICD-10-CM | POA: Insufficient documentation

## 2022-06-08 DIAGNOSIS — R079 Chest pain, unspecified: Secondary | ICD-10-CM

## 2022-06-08 DIAGNOSIS — R0602 Shortness of breath: Secondary | ICD-10-CM

## 2022-06-08 DIAGNOSIS — Z79899 Other long term (current) drug therapy: Secondary | ICD-10-CM | POA: Diagnosis not present

## 2022-06-08 DIAGNOSIS — I129 Hypertensive chronic kidney disease with stage 1 through stage 4 chronic kidney disease, or unspecified chronic kidney disease: Secondary | ICD-10-CM | POA: Insufficient documentation

## 2022-06-08 DIAGNOSIS — N189 Chronic kidney disease, unspecified: Secondary | ICD-10-CM | POA: Insufficient documentation

## 2022-06-08 DIAGNOSIS — R0789 Other chest pain: Secondary | ICD-10-CM | POA: Insufficient documentation

## 2022-06-08 DIAGNOSIS — R531 Weakness: Secondary | ICD-10-CM | POA: Diagnosis not present

## 2022-06-08 DIAGNOSIS — J449 Chronic obstructive pulmonary disease, unspecified: Secondary | ICD-10-CM | POA: Diagnosis not present

## 2022-06-08 DIAGNOSIS — Z7982 Long term (current) use of aspirin: Secondary | ICD-10-CM | POA: Diagnosis not present

## 2022-06-08 DIAGNOSIS — R42 Dizziness and giddiness: Secondary | ICD-10-CM | POA: Diagnosis not present

## 2022-06-08 LAB — BASIC METABOLIC PANEL
Anion gap: 9 (ref 5–15)
BUN: 10 mg/dL (ref 8–23)
CO2: 21 mmol/L — ABNORMAL LOW (ref 22–32)
Calcium: 8.8 mg/dL — ABNORMAL LOW (ref 8.9–10.3)
Chloride: 108 mmol/L (ref 98–111)
Creatinine, Ser: 1.17 mg/dL (ref 0.61–1.24)
GFR, Estimated: >60 mL/min (ref >60)
Glucose, Bld: 118 mg/dL — ABNORMAL HIGH (ref 70–99)
Potassium: 3.7 mmol/L (ref 3.5–5.1)
Sodium: 138 mmol/L (ref 135–145)

## 2022-06-08 LAB — CBC
HCT: 40.7 % (ref 39.0–52.0)
Hemoglobin: 13.2 g/dL (ref 13.0–17.0)
MCH: 30.3 pg (ref 26.0–34.0)
MCHC: 32.4 g/dL (ref 30.0–36.0)
MCV: 93.6 fL (ref 80.0–100.0)
Platelets: 273 10*3/uL (ref 150–400)
RBC: 4.35 MIL/uL (ref 4.22–5.81)
RDW: 12.4 % (ref 11.5–15.5)
WBC: 7.4 10*3/uL (ref 4.0–10.5)
nRBC: 0 % (ref 0.0–0.2)

## 2022-06-08 LAB — RAPID URINE DRUG SCREEN, HOSP PERFORMED
Amphetamines: NOT DETECTED
Barbiturates: NOT DETECTED
Benzodiazepines: NOT DETECTED
Cocaine: POSITIVE — AB
Opiates: NOT DETECTED
Tetrahydrocannabinol: NOT DETECTED

## 2022-06-08 LAB — TROPONIN I (HIGH SENSITIVITY)
Troponin I (High Sensitivity): 10 ng/L (ref <18)
Troponin I (High Sensitivity): 13 ng/L (ref <18)

## 2022-06-08 MED ORDER — NITROGLYCERIN 0.4 MG SL SUBL: 0.4000 mg | SUBLINGUAL_TABLET | SUBLINGUAL | Status: DC | PRN

## 2022-06-08 MED ORDER — ASPIRIN 81 MG PO CHEW
324.0000 mg | CHEWABLE_TABLET | Freq: Once | ORAL | Status: AC
  Administered 2022-06-08: 324 mg via ORAL
  Filled 2022-06-08: qty 4

## 2022-06-08 NOTE — ED Triage Notes (Signed)
Patient complains of central chest pressure and dizziness that started three days ago. Patient is alert, oriented, ambulatory, and in no apparent distress at this time.

## 2022-06-08 NOTE — Discharge Instructions (Signed)

## 2022-06-08 NOTE — ED Provider Notes (Signed)
[facility] EMERGENCY DEPARTMENT Provider Note   CSN: [id number] Arrival date & time: 06/08/22  1013     History  Chief Complaint  Patient presents with   Chest Pain    Robert Chan is a 66 y.o. male.  The history is provided by the patient and medical records. No language interpreter was used.  Chest Pain Pain location:  Substernal area Pain quality: crushing and pressure   Pain radiates to:  Does not radiate Pain severity:  Moderate Onset quality:  Gradual Duration:  3 days Timing:  Intermittent Progression:  Waxing and waning Chronicity:  New Relieved by:  Nothing Worsened by:  Exertion Ineffective treatments:  None tried Associated symptoms: shortness of breath   Associated symptoms: no abdominal pain, no altered mental status, no back pain, no cough, no diaphoresis, no fatigue, no fever, no headache, no lower extremity edema, no nausea, no near-syncope, no palpitations, no vomiting and no weakness   Risk factors: coronary artery disease        Home Medications Prior to Admission medications   Medication Sig Start Date End Date Taking? Authorizing Provider  albuterol (VENTOLIN HFA) 108 (90 Base) MCG/ACT inhaler Inhale 1-2 puffs into the lungs every 6 (six) hours as needed for wheezing or shortness of breath. 01/11/22   Elsie Stain, MD  amLODipine (NORVASC) 10 MG tablet Take 1 tablet (10 mg total) by mouth daily. 01/11/22   Elsie Stain, MD  ARIPiprazole (ABILIFY) 10 MG tablet Take by mouth. 12/25/20   [provider]  aspirin 81 MG EC tablet Take 1 tablet (81 mg total) by mouth daily. 01/15/22   Charlott Rakes, MD  atorvastatin (LIPITOR) 80 MG tablet Take 1 tablet (80 mg total) by mouth daily. 05/21/22 05/22/23  Swinyer, Lanice Schwab, NP  diltiazem (TIAZAC) 240 MG 24 hr capsule Take 1 capsule (240 mg total) by mouth daily. 01/11/22   Elsie Stain, MD  latanoprost (XALATAN) 0.005 % ophthalmic solution Place 1 drop into both eyes  nightly. 01/11/22   Elsie Stain, MD  SYMBICORT 160-4.5 MCG/ACT inhaler Inhale 2 puffs into the lungs in the morning and at bedtime. 09/28/21   Elsie Stain, MD  valsartan (DIOVAN) 160 MG tablet Take 1 tablet (160 mg total) by mouth daily. 01/11/22   Elsie Stain, MD      Allergies    Shellfish allergy and Shellfish-derived products    Review of Systems   Review of Systems  Constitutional:  Negative for chills, diaphoresis, fatigue and fever.  HENT:  Negative for congestion.   Eyes:  Negative for visual disturbance.  Respiratory:  Positive for shortness of breath. Negative for cough and chest tightness.   Cardiovascular:  Positive for chest pain. Negative for palpitations and near-syncope.  Gastrointestinal:  Negative for abdominal pain, constipation, diarrhea, nausea and vomiting.  Genitourinary:  Negative for flank pain.  Musculoskeletal:  Negative for back pain, neck pain and neck stiffness.  Skin:  Negative for wound.  Neurological:  Positive for light-headedness. Negative for syncope, weakness and headaches.  Psychiatric/Behavioral:  Negative for agitation and confusion.   All other systems reviewed and are negative.   Physical Exam Updated Vital Signs BP (!) 145/86   Pulse 65   Temp (!) 97.5 F (36.4 C) (Oral)   Resp 16   SpO2 97%  Physical Exam Vitals and nursing note reviewed.  Constitutional:      General: He is not in acute distress.    Appearance: He  is well-developed. He is not ill-appearing, toxic-appearing or diaphoretic.  HENT:     Head: Normocephalic and atraumatic.  Eyes:     Conjunctiva/sclera: Conjunctivae normal.     Pupils: Pupils are equal, round, and reactive to light.  Cardiovascular:     Rate and Rhythm: Normal rate and regular rhythm.     Heart sounds: Normal heart sounds. No murmur heard. Pulmonary:     Effort: Pulmonary effort is normal. No respiratory distress.     Breath sounds: Normal breath sounds. No wheezing, rhonchi or  rales.  Chest:     Chest wall: No tenderness.  Abdominal:     Palpations: Abdomen is soft.     Tenderness: There is no abdominal tenderness.  Musculoskeletal:        General: No swelling.     Cervical back: Neck supple.     Right lower leg: No tenderness. Edema present.     Left lower leg: No tenderness. Edema present.  Skin:    General: Skin is warm and dry.     Capillary Refill: Capillary refill takes less than 2 seconds.     Findings: No erythema.  Neurological:     General: No focal deficit present.     Mental Status: He is alert.  Psychiatric:        Mood and Affect: Mood normal.     ED Results / Procedures / Treatments   Labs (all labs ordered are listed, but only abnormal results are displayed) Labs Reviewed  BASIC METABOLIC PANEL - Abnormal; Notable for the following components:      Result Value   CO2 21 (*)    Glucose, Bld 118 (*)    Calcium 8.8 (*)    All other components within normal limits  CBC  RAPID URINE DRUG SCREEN, HOSP PERFORMED  TROPONIN I (HIGH SENSITIVITY)  TROPONIN I (HIGH SENSITIVITY)    EKG EKG Interpretation  Date/Time:  Tuesday June 08 2022 10:16:37 EDT Ventricular Rate:  82 PR Interval:  176 QRS Duration: 86 QT Interval:  390 QTC Calculation: 455 R Axis:   123 Text Interpretation: Normal sinus rhythm Right axis deviation Possible Anterior infarct , age undetermined Abnormal ECG When compared with ECG of 15-Apr-2021 22:26, PREVIOUS ECG IS PRESENT when compared to prior, similar S1 pattern to Feb 2022. No STEMI Confirmed by Antony Blackbird [id number]) on 06/08/2022 11:45:08 AM  Radiology DG Chest 2 View  Result Date: 06/08/2022 CLINICAL DATA:  66 year old male with chest pain, weakness and dizziness. EXAM: CHEST - 2 VIEW COMPARISON:  Chest radiograph 04/15/2021 and earlier. FINDINGS: Upright AP and lateral views. Lower lung volumes compared to 2022. Mediastinal contours remain normal. Both lungs appear clear. No pneumothorax or visible  pleural effusion. No acute osseous abnormality identified. Negative visible bowel gas. IMPRESSION: No acute cardiopulmonary abnormality. Electronically Signed   By: Genevie Ann M.D.   On: 06/08/2022 10:47    Procedures Procedures    Medications Ordered in ED Medications  nitroGLYCERIN (NITROSTAT) SL tablet 0.4 mg (has no administration in time range)  aspirin chewable tablet 324 mg (324 mg Oral Given 06/08/22 1444)    ED Course/ Medical Decision Making/ A&P                           Medical Decision Making Risk OTC drugs. Prescription drug management.    Robert Chan is a 66 y.o. male with a past medical history significant for previous NSTEMI, COPD, asthma, hypertension,  and CKD who presents with exertional chest pain.  He reports that over the last several days he has had chest pressure and shortness of breath that is exertional.  He reports he gets lightheaded at times with it but is not pleuritic.  He denies any palpitations, nausea, vomiting, or diaphoresis.  Denies any trauma.  He denies any fevers, chills, congestion, or cough.  Denies any leg pain or leg swelling.  Reports that he has not used any drugs in over a month.  He denies any rashes to suggest shingles.    On my exam, lungs clear and chest is nontender.  Abdomen nontender.  I did not appreciate a murmur.  He has good pulses in extremities.  Patient is resting comfortably without evidence of hypoxia, tachypnea, or tachycardia.  EKG does not show STEMI.  Chart review shows that patient had some cardiac imaging recently that could not rule out some blockages and his previous cath showed 30% stenosis that was treated medically.  Given his exertional chest pain and shortness of breath, decided to call cardiology for consultation to help determine disposition.  His overall workup here thus far was reassuring.  Initial troponin is negative, will trend.  CBC and metabolic panel were not critically abnormal.  Chest x-ray showed no  acute cardiopulmonary abnormality.  Patient was given aspirin and some single nitroglycerin for his discomfort and for cardioprotection.  Will await cardiology evaluation and recommendations.           Final Clinical Impression(s) / ED Diagnoses Final diagnoses:  Exertional shortness of breath  Exertional chest pain    Clinical Impression: 1. Exertional shortness of breath   2. Exertional chest pain     Disposition: Care transferred to oncoming team to await cardiology evaluation and recommendations.  This note was prepared with assistance of Systems analyst. Occasional wrong-word or sound-a-like substitutions may have occurred due to the inherent limitations of voice recognition software.     Wendell Nicoson, Gwenyth Allegra, MD 06/08/22 [id number]

## 2022-06-08 NOTE — Consult Note (Addendum)
Cardiology Consultation:   Patient ID: Selwyn Reason MRN: [id number]; DOB: October 08, 1956  Admit date: 06/08/2022 Date of Consult: 06/08/2022  PCP:  Elsie Stain, MD   [facility] Providers Cardiologist:  [name], MD    Patient Profile:   Tion Tse is a 66 y.o. male with a hx of ASCAD, CKD, COPD, cocaine use, depression, HTN who is being seen 06/08/2022 for the evaluation of chest pain at the request of Dr. Sherry Ruffing.  History of Present Illness:   Mr. Ehle is a 66 year old male with above medical history who is followed by Dr. Radford Pax.  Per chart review, patient presented in August 2021 with an NSTEMI.  Underwent left heart catheterization on 06/05/2020 that showed 30% stenosis of the proximal LAD, 20% stenosis of mid RCA, normal left main, normal left circumflex.  Suspected that chest pain and troponin elevation were due to cocaine induced vasospasm.  A follow-up echocardiogram on 09/07/2020 showed EF 60-65%, no regional wall motion abnormalities, grade 1 diastolic dysfunction, normal RV systolic function no hemodynamically significant valvular abnormalities.  Patient was seen by Dr. Radford Pax in the outpatient setting on 01/05/2021.  At that time, patient was complaining of chronic dyspnea on exertion related to COPD and asthma.  He also complained of having intermittent chest pressure. Underwent nuclear stress test on 01/08/2021 that showed EF 50% and was a normal, low risk study.   Patient was most recently seen by cardiology on 01/19/2022 for an outpatient follow-up.  At that time, patient continued to complain of exertional chest pain. Had an echocardiogram on 01/21/2022 that showed EF 55-60% with regional wall motion abnormalities.  RV systolic function was normal.  No hemodynamically significant valvular disease. Also underwent a coronary CT scan on 02/02/2022 that showed mild non-obstructive CAD, coronary calcium score of 589 (93rd percentile). Recommended consideration of  non-atherosclerotic causes of chest pain   Patient presented to the ED on 8/8 complaining of central chest pressure and dizziness that started 3 days ago. Labs in the ED showed Na 138, K 3.7, creatinine 1.17, WBC 7.4, hemoglobin 13.2, platelets 273. Initial hsTn 10.  CXR with no acute cardiopulmonary abnormality.   On interview, patient reports that for the past few days, he has had chest pressure and SOB. These symptoms are typically exertional, but they also can occur at rest. Chest pressure is located in the middle of his chest, occasionally radiates to the right. Pressure sometimes worse with deep inhalation. Patient works out/lifts weights regularly, denies having any recent chest pain on exertion. Sometimes feels dizzy. Denies palpitations, nausea, fever, chills.    Patient denies tobacco use. Has a history of cocaine use, last used 1 month ago.   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   Depression    Elevated serum creatinine 08/28/2019   Homelessness 06/19/2020   Hypertension    NSTEMI (non-ST elevated myocardial infarction) [facility])    Status post incision and drainage 04/22/2021   Suicidal ideation 06/05/2020    Past Surgical History:  Procedure Laterality Date   APPENDECTOMY     EYE SURGERY     LEFT HEART CATH AND CORONARY ANGIOGRAPHY N/A 06/05/2020   Procedure: LEFT HEART CATH AND  CORONARY ANGIOGRAPHY;  Surgeon: Nigel Mormon, MD;  Location: [place] [facility];  Service: Cardiovascular;  Laterality: N/A;   PROSTATE SURGERY       Home Medications:  Prior to Admission medications   Medication Sig Start Date End Date Taking? Authorizing Provider  albuterol (VENTOLIN HFA) 108 (90 Base) MCG/ACT  inhaler Inhale 1-2 puffs into the lungs every 6 (six) hours as needed for wheezing or shortness of breath. 01/11/22   Elsie Stain, MD  amLODipine (NORVASC) 10 MG tablet Take 1 tablet (10 mg total) by mouth daily. 01/11/22   Elsie Stain, MD  ARIPiprazole (ABILIFY) 10 MG tablet Take by mouth. 12/25/20   [provider]  aspirin 81 MG EC tablet Take 1 tablet (81 mg total) by mouth daily. 01/15/22   Charlott Rakes, MD  atorvastatin (LIPITOR) 80 MG tablet Take 1 tablet (80 mg total) by mouth daily. 05/21/22 05/22/23  Swinyer, Lanice Schwab, NP  diltiazem (TIAZAC) 240 MG 24 hr capsule Take 1 capsule (240 mg total) by mouth daily. 01/11/22   Elsie Stain, MD  latanoprost (XALATAN) 0.005 % ophthalmic solution Place 1 drop into both eyes nightly. 01/11/22   Elsie Stain, MD  SYMBICORT 160-4.5 MCG/ACT inhaler Inhale 2 puffs into the lungs in the morning and at bedtime. 09/28/21   Elsie Stain, MD  valsartan (DIOVAN) 160 MG tablet Take 1 tablet (160 mg total) by mouth daily. 01/11/22   Elsie Stain, MD    Inpatient Medications: Scheduled Meds:  Continuous Infusions:  PRN Meds: nitroGLYCERIN  Allergies:    Allergies  Allergen Reactions   Shellfish Allergy Hives and Rash   Shellfish-Derived Products Rash    Other reaction(s): Other (See Comments)    Social History:   Social History   Socioeconomic History   Marital status: Single    Spouse name: Not on file   Number of children: Not on file   Years of education: Not on file   Highest education level: Not on file  Occupational History   Not on file  Tobacco Use   Smoking status: Former    Types: Cigarettes    Quit date: 12/25/2020    Years since quitting: 1.4   Smokeless tobacco: Never   Tobacco comments:    smokes 3-4 cigarettes/day  Vaping Use   Vaping Use: Never used  Substance and Sexual Activity   Alcohol use: Yes    Comment: drinks 2x/wk, 2-3 40 oz drinks, last drink Friday   Drug use: Yes     Types: Cocaine    Comment: cocaine use once a month, last use Friday    Sexual activity: Not Currently  Other Topics Concern   Not on file  Social History Narrative   His mother passed at age 5 (when he was 81 years old) and his father raised him and his siblings   His parents were pastors as a lot of his sisters and other family members are today   There was a total of 13 children in his family Had 24 sisters, one sister deceased , Had 3 brothers, all brothers deceased   He is the youngest of the 39 and was "always in trouble in my younger years"   Family still live in [place], [place], some in [place], [place]   Has a Daughter and son with a total of 10 grandchildren (60 grand daughters local and 47 grandsons)    Values family   Married twice, present wife incarcerated  Social Determinants of Health   Financial Resource Strain: Low Risk  (03/31/2022)   Overall Financial Resource Strain (CARDIA)    Difficulty of Paying Living Expenses: Not hard at all  Food Insecurity: No Food Insecurity (03/31/2022)   Hunger Vital Sign    Worried About Running Out of Food in the Last Year: Never true    Ran Out of Food in the Last Year: Never true  Transportation Needs: No Transportation Needs (03/31/2022)   PRAPARE - Hydrologist (Medical): No    Lack of Transportation (Non-Medical): No  Physical Activity: Not on file  Stress: No Stress Concern Present (03/31/2022)   [place]    Feeling of Stress : Only a little  Social Connections: Moderately Integrated (03/31/2022)   Social Connection and Isolation Panel [NHANES]    Frequency of Communication with Friends and Family: Three times a week    Frequency of Social Gatherings with Friends and Family: Three times a week    Attends Religious Services: 1 to 4 times per year    Active Member of Clubs or Organizations: Yes    Attends Archivist  Meetings: 1 to 4 times per year    Marital Status: Never married  Intimate Partner Violence: Not At Risk (03/31/2022)   Humiliation, Afraid, Rape, and Kick questionnaire    Fear of Current or Ex-Partner: No    Emotionally Abused: No    Physically Abused: No    Sexually Abused: No    Family History:    Family History  Problem Relation Age of Onset   Hypertension Mother    Diabetes Mother    Hypertension Father    Hypertension Sister      ROS:  Please see the history of present illness.   All other ROS reviewed and negative.     Physical Exam/Data:   Vitals:   06/08/22 1400 06/08/22 1408 06/08/22 1415 06/08/22 1430  BP: (!) 157/115  (!) 141/74 (!) 129/111  Pulse: 73  67 66  Resp: '16  17 14  '$ Temp:  (!) 97.3 F (36.3 C)    TempSrc:      SpO2: 98%  99% 100%   No intake or output data in the 24 hours ending 06/08/22 1513    05/17/2022   10:12 AM 05/11/2022    2:25 PM 01/19/2022    9:54 AM  Last 3 Weights  Weight (lbs) 283 lb 289 lb 3.2 oz 300 lb 8 oz  Weight (kg) 128.368 kg 131.18 kg 136.306 kg     There is no height or weight on file to calculate BMI.  General:  Well nourished, well developed, in no acute distress. Resting comfortably in the bed eating lunch  HEENT: normal Neck: no JVD Vascular: Radial pulses 2+ bilaterally Cardiac:  normal S1, S2; RRR; no murmur  Lungs:  clear to auscultation bilaterally, no wheezing, rhonchi or rales  Abd: soft, nontender, no hepatomegaly  Ext: no edema Musculoskeletal:  No deformities, BUE and BLE strength normal and equal Skin: warm and dry  Neuro:  CNs 2-12 intact, no focal abnormalities noted Psych:  Normal affect   EKG:  The EKG was personally reviewed and demonstrates:  Sinus rhythm, right axis deviation  Telemetry:  Telemetry was personally reviewed and demonstrates:  Sinus rhythm   Relevant CV Studies:  Coronary CT 02/02/22 IMPRESSION: 1.  Mild non-obstructive CAD, CADRADS = 2.   2. Coronary calcium score of  54.  This was 93rd percentile for age and sex matched control.   3. Normal coronary origin with right dominance.    Laboratory Data:  High Sensitivity Troponin:   Recent Labs  Lab 06/08/22 1027  TROPONINIHS 10     Chemistry Recent Labs  Lab 06/08/22 1027  NA 138  K 3.7  CL 108  CO2 21*  GLUCOSE 118*  BUN 10  CREATININE 1.17  CALCIUM 8.8*  GFRNONAA >60  ANIONGAP 9    No results for input(s): "PROT", "ALBUMIN", "AST", "ALT", "ALKPHOS", "BILITOT" in the last 168 hours. Lipids No results for input(s): "CHOL", "TRIG", "HDL", "LABVLDL", "LDLCALC", "CHOLHDL" in the last 168 hours.  Hematology Recent Labs  Lab 06/08/22 1027  WBC 7.4  RBC 4.35  HGB 13.2  HCT 40.7  MCV 93.6  MCH 30.3  MCHC 32.4  RDW 12.4  PLT 273   Thyroid No results for input(s): "TSH", "FREET4" in the last 168 hours.  BNPNo results for input(s): "BNP", "PROBNP" in the last 168 hours.  DDimer No results for input(s): "DDIMER" in the last 168 hours.   Radiology/Studies:  DG Chest 2 View  Result Date: 06/08/2022 CLINICAL DATA:  66 year old male with chest pain, weakness and dizziness. EXAM: CHEST - 2 VIEW COMPARISON:  Chest radiograph 04/15/2021 and earlier. FINDINGS: Upright AP and lateral views. Lower lung volumes compared to 2022. Mediastinal contours remain normal. Both lungs appear clear. No pneumothorax or visible pleural effusion. No acute osseous abnormality identified. Negative visible bowel gas. IMPRESSION: No acute cardiopulmonary abnormality. Electronically Signed   By: Genevie Ann M.D.   On: 06/08/2022 10:47     Assessment and Plan:   Chest pain  - Patient presenting complaining of a few days of intermittent chest tightness. Tightness is located in the center of his chest, radiates to the right. Occasionally associated with sob, although patient does admit to having chronic SOB with COPD and asthma. Tightness sometimes worse with deep inhalation  - Of note, patient has been evaluated for chest pain  in the past. Cath in 06/2020 with minimal nonobstructive CAD, stress test in 12/2020 that was a normal/low risk study  - Most recently, patient had a coronary CT scan on 02/02/2022-- showed a coronary calcium score of 589 (93rd percentile), mild non-obstructive CAD  - In the ED-- initial Hstn 10, delta pending  - EKG without acute ischemic changes  - UDS positive for cocaine  - Given patient's fairly recent coronary CT scan that showed mild, non-obstructive disease, atypical description of chest pain, initially negative hsTn, and UDS positive for cocaine-- I suspect patient can be discharged from the ED after a second negative hsTn  - Dr. Gwenlyn Found to see, patient should not be discharged prior to his evaluation   COPD  Asthma  - Patient admits to having chronic SOB. Believes his breathing is well controlled with his home inhalers  - Patient oxygenating well on room air in the ED, lungs were clear to ausculation without obvious wheezing  - Continue home regiment     Risk Assessment/Risk Scores:   HEAR Score (for undifferentiated chest pain): Pathway disabled         For questions or updates, please contact [facility] Please consult [email or web address] for contact info under    Signed, Margie Billet, PA-C  06/08/2022 3:13 PM  Agree with note by Vikki Ports PA-C  We are asked to see this 66 year old moderately overweight though muscular appearing African-American male for atypical chest pain.  His  risk factors include tobacco abuse and treated hypertension.  He had a heart catheterization performed 2 years ago that was essentially normal and a coronary CTA performed 4 months ago that likewise showed no obstructive disease.  His pain is atypical, mostly right chest rating down his right arm.  His EKG shows no acute changes.  Exam is benign.  Troponins are low normal.  I do not think this represents ischemic chest pain.  If his second troponin is negative he can be discharged  home  Lorretta Harp, M.D., [place], [facility], [place], [place] [address]. [place], [place]  [id number]  [phone] 06/08/2022 4:54 PM

## 2022-06-08 NOTE — ED Provider Triage Note (Signed)
Emergency Medicine Provider Triage Evaluation Note  Robert Chan , a 66 y.o. male  was evaluated in triage.  Pt complains of chest pain and lightheadedness.  Symptoms started about 3 days ago.  Reports constant central chest pressure and dizziness described as feeling lightheaded as though he may pass out.  Has not passed out.  Reports chronic shortness of breath due to COPD, slight worsening in shortness of breath when chest pressure worsens.  Review of Systems  Positive: Chest pain, shortness of breath, lightheadedness Negative: Fever, abdominal pain, vomiting  Physical Exam  BP 113/83 (BP Location: Right Arm)   Pulse 70   Temp (!) 97.5 F (36.4 C) (Oral)   Resp 16   SpO2 95%  Gen:   Awake, no distress   Resp:  Normal effort, decreased air movement bilaterally, no wheezes or rales MSK:   Moves extremities without difficulty  Other:    Medical Decision Making  Medically screening exam initiated at 10:20 AM.  Appropriate orders placed.  Mirl Hillery was informed that the remainder of the evaluation will be completed by another provider, this initial triage assessment does not replace that evaluation, and the importance of remaining in the ED until their evaluation is complete.     Jacqlyn Larsen, [place] 06/08/22 1021

## 2022-06-09 NOTE — Progress Notes (Deleted)
Office Visit    Patient Name: Robert Chan Date of Encounter: 06/09/2022  Primary Care Provider:  Elsie Stain, MD Primary Cardiologist:  [name], MD Primary Electrophysiologist: None  Chief Complaint    Robert Chan is a 66 y.o. male with PMH of CKD, COPD, asthma, HTN, NSTEMI, left lower extremity DVT, polysubstance abuse, coronary artery disease s/p NSTEMI with minimal disease in proximal LAD and mRCA who presents today for hospital follow-up of chest pain.  Past Medical History    Past Medical History:  Diagnosis Date   Acute bilateral low back pain without sciatica 02/27/2021   Asthma    CAD (coronary artery disease)    Cataract    CKD (chronic kidney disease)    Cocaine use disorder, mild, abuse (HCC)    COPD (chronic obstructive pulmonary disease) (HCC)    Coronary vasospasm ([place]) 10/07/2020   Depression    Elevated serum creatinine 08/28/2019   Homelessness 06/19/2020   Hypertension    NSTEMI (non-ST elevated myocardial infarction) [facility])    Status post incision and drainage 04/22/2021   Suicidal ideation 06/05/2020   Past Surgical History:  Procedure Laterality Date   APPENDECTOMY     EYE SURGERY     LEFT HEART CATH AND CORONARY ANGIOGRAPHY N/A 06/05/2020   Procedure: LEFT HEART CATH AND CORONARY ANGIOGRAPHY;  Surgeon: Nigel Mormon, MD;  Location: [facility];  Service: Cardiovascular;  Laterality: N/A;   PROSTATE SURGERY      Allergies  Allergies  Allergen Reactions   Shellfish Allergy Hives and Rash   Shellfish-Derived Products Rash    Other reaction(s): Other (See Comments)    History of Present Illness    Robert Chan is a 66 year old male with the above-mentioned past medical history who presents today for follow-up of recent ED visit concerning for chest pain.  Mr. Fralix was recently seen in 2021 for evaluation of chest pain.  He had recently presented to the ED in 06/2020 and found to have NSTEMI where he subsequently  underwent LHC revealing30% prox LAD and mid LAD myocardial bridge and 20% mid RCA with normal LVF and normal LVEDP.  2D echo was also completed with grade 1 DD, no valvular heart disease, and normal LV function.  It was felt that patient's NSTEMI was contributed to recent cocaine use. He established care with Dr. Radford Pax in 12/2020 with complaint of chronic dyspnea on exertion, lower extremity edema and complaints of intermittent chest pain.  Lexiscan stress test was performed 12/2020 showed no ischemia or infarct.  Recommended to have a 2D echo but this was never scheduled.  He was seen in follow-up 12/2021 with complaint of exertional chest pain that occurs with daily walks.  The pain was described as central and nonradiating, patient also endorsed extreme fatigue which he felt was also bothersome.  He was sent for coronary CTA that revealed calcium score 589 putting him at 93rd percent with mild nonobstructive CAD and CAD RADS score of 2.  2D echo was also completed showing EF of 55-60%, with no RWMA, mild hypokinesis of the mid inferoseptal wall, and no valvular abnormalities.  He was seen for 4-week follow-up and stated that pain had improved but was still somewhat bothersome with occasional discomfort.  He presented to the ED on 8/8 with complaints of central chest pain that was found to be typically exertional.  His urine drug screen was positive for cocaine and patient was subsequently discharged following second negative troponin.  Since last being seen in the office patient reports***.  Patient denies chest pain, palpitations, dyspnea, PND, orthopnea, nausea, vomiting, dizziness, syncope, edema, weight gain, or early satiety.     ***Notes: Chest pain and shortness of breath since discharge -2D echo completed 12/2021 with EF of 55-60%, with no RWMA, mild hypokinesis  -Cardiac CCT scan on 02/02/2022 that showed mild non-obstructive CADTA  -Will be started on isosorbide for chronic angina. -No  beta-blockers due to history of cocaine abuse Home Medications    Current Outpatient Medications  Medication Sig Dispense Refill   albuterol (VENTOLIN HFA) 108 (90 Base) MCG/ACT inhaler Inhale 1-2 puffs into the lungs every 6 (six) hours as needed for wheezing or shortness of breath. 90 g 2   amLODipine (NORVASC) 10 MG tablet Take 1 tablet (10 mg total) by mouth daily. 90 tablet 2   ARIPiprazole (ABILIFY) 10 MG tablet Take by mouth.     aspirin 81 MG EC tablet Take 1 tablet (81 mg total) by mouth daily. 30 tablet 6   atorvastatin (LIPITOR) 80 MG tablet Take 1 tablet (80 mg total) by mouth daily. 90 tablet 3   diltiazem (TIAZAC) 240 MG 24 hr capsule Take 1 capsule (240 mg total) by mouth daily. 90 capsule 2   latanoprost (XALATAN) 0.005 % ophthalmic solution Place 1 drop into both eyes nightly. 2.5 mL 3   SYMBICORT 160-4.5 MCG/ACT inhaler Inhale 2 puffs into the lungs in the morning and at bedtime. 10.2 g 11   valsartan (DIOVAN) 160 MG tablet Take 1 tablet (160 mg total) by mouth daily. 90 tablet 3   No current facility-administered medications for this visit.     Review of Systems  Please see the history of present illness.    (+)*** (+)***  All other systems reviewed and are otherwise negative except as noted above.  Physical Exam    Wt Readings from Last 3 Encounters:  05/17/22 283 lb (128.4 kg)  05/11/22 289 lb 3.2 oz (131.2 kg)  01/19/22 (!) 300 lb 8 oz (136.3 kg)   ZO:XWRUE were no vitals filed for this visit.,There is no height or weight on file to calculate BMI.  Constitutional:      Appearance: Healthy appearance. Not in distress.  Neck:     Vascular: JVD normal.  Pulmonary:     Effort: Pulmonary effort is normal.     Breath sounds: No wheezing. No rales. Diminished in the bases Cardiovascular:     Normal rate. Regular rhythm. Normal S1. Normal S2.      Murmurs: There is no murmur.  Edema:    Peripheral edema absent.  Abdominal:     Palpations: Abdomen is soft  non tender. There is no hepatomegaly.  Skin:    General: Skin is warm and dry.  Neurological:     General: No focal deficit present.     Mental Status: Alert and oriented to person, place and time.     Cranial Nerves: Cranial nerves are intact.  EKG/LABS/Other Studies Reviewed    ECG personally reviewed by me today - ***  Risk Assessment/Calculations:   {Does this patient have ATRIAL FIBRILLATION?[phone]}        Lab Results  Component Value Date   WBC 7.4 06/08/2022   HGB 13.2 06/08/2022   HCT 40.7 06/08/2022   MCV 93.6 06/08/2022   PLT 273 06/08/2022   Lab Results  Component Value Date   CREATININE 1.17 06/08/2022   BUN 10 06/08/2022   NA 138 06/08/2022  K 3.7 06/08/2022   CL 108 06/08/2022   CO2 21 (L) 06/08/2022   Lab Results  Component Value Date   ALT 35 05/17/2022   AST 21 05/17/2022   ALKPHOS 74 05/17/2022   BILITOT 0.6 05/17/2022   Lab Results  Component Value Date   CHOL 173 05/17/2022   HDL 53 05/17/2022   LDLCALC 104 (H) 05/17/2022   TRIG 89 05/17/2022   CHOLHDL 3.3 05/17/2022    Lab Results  Component Value Date   HGBA1C 4.8 09/07/2020    Assessment & Plan    1.  Coronary artery disease:   2.  Essential hypertension: -Blood pressure today was***  3.  Hyperlipidemia: -Last LDL cholesterol was 104 above goal of less than 70  4.  Precordial chest pain:  5.  COPD:        Disposition: Follow-up with [name], MD or APP in *** months {Are you ordering a CV Procedure (e.g. stress test, cath, DCCV, TEE, etc)?   Press F2        :[id number]}   Medication Adjustments/Labs and Tests Ordered: Current medicines are reviewed at length with the patient today.  Concerns regarding medicines are outlined above.   Signed, Mable Fill, Marissa Nestle, NP 06/09/2022, 12:23 PM [place]

## 2022-06-10 ENCOUNTER — Ambulatory Visit: Payer: Medicare HMO | Admitting: Nurse Practitioner

## 2022-06-21 NOTE — Progress Notes (Deleted)
Office Visit    Patient Name: Robert Chan Date of Encounter: 06/21/2022  Primary Care Provider:  Elsie Stain, MD Primary Cardiologist:  [name], MD Primary Electrophysiologist: None  Chief Complaint    Robert Chan is a 66 y.o. male with PMH of CKD, COPD, asthma, HTN, NSTEMI, left lower extremity DVT, polysubstance abuse, coronary artery disease s/p NSTEMI with minimal disease in proximal LAD and mRCA who presents today for hospital follow-up of chest pain.  Past Medical History    Past Medical History:  Diagnosis Date   Acute bilateral low back pain without sciatica 02/27/2021   Asthma    CAD (coronary artery disease)    Cataract    CKD (chronic kidney disease)    Cocaine use disorder, mild, abuse (HCC)    COPD (chronic obstructive pulmonary disease) (HCC)    Coronary vasospasm ([place]) 10/07/2020   Depression    Elevated serum creatinine 08/28/2019   Homelessness 06/19/2020   Hypertension    NSTEMI (non-ST elevated myocardial infarction) [facility])    Status post incision and drainage 04/22/2021   Suicidal ideation 06/05/2020   Past Surgical History:  Procedure Laterality Date   APPENDECTOMY     EYE SURGERY     LEFT HEART CATH AND CORONARY ANGIOGRAPHY N/A 06/05/2020   Procedure: LEFT HEART CATH AND CORONARY ANGIOGRAPHY;  Surgeon: Nigel Mormon, MD;  Location: [facility];  Service: Cardiovascular;  Laterality: N/A;   PROSTATE SURGERY      Allergies  Allergies  Allergen Reactions   Shellfish Allergy Hives and Rash   Shellfish-Derived Products Rash    Other reaction(s): Other (See Comments)    History of Present Illness    Robert Chan is a 66 year old male with the above-mentioned past medical history who presents today for follow-up of recent ED visit concerning for chest pain.  Robert Chan was recently seen in 2021 for evaluation of chest pain.  He had recently presented to the ED in 06/2020 and found to have NSTEMI where he subsequently  underwent LHC revealing30% prox LAD and mid LAD myocardial bridge and 20% mid RCA with normal LVF and normal LVEDP.  2D echo was also completed with grade 1 DD, no valvular heart disease, and normal LV function.  It was felt that patient's NSTEMI was contributed to recent cocaine use. He established care with Dr. Radford Pax in 12/2020 with complaint of chronic dyspnea on exertion, lower extremity edema and complaints of intermittent chest pain.  Lexiscan stress test was performed 12/2020 showed no ischemia or infarct.  Recommended to have a 2D echo but this was never scheduled.   He was seen in follow-up 12/2021 with complaint of exertional chest pain that occurs with daily walks.  The pain was described as central and nonradiating, patient also endorsed extreme fatigue which he felt was also bothersome.  He was sent for coronary CTA that revealed calcium score 589 putting him at 93rd percent with mild nonobstructive CAD and CAD RADS score of 2.  2D echo was also completed showing EF of 55-60%, with no RWMA, mild hypokinesis of the mid inferoseptal wall, and no valvular abnormalities.  He was seen for 4-week follow-up and stated that pain had improved but was still somewhat bothersome with occasional discomfort.  He presented to the ED on 8/8 with complaints of central chest pain that was found to be typically exertional.  His urine drug screen was positive for cocaine and patient was subsequently discharged following second negative troponin.  Since last being seen in the office patient reports***.  Patient denies chest pain, palpitations, dyspnea, PND, orthopnea, nausea, vomiting, dizziness, syncope, edema, weight gain, or early satiety.         ***Notes: Chest pain and shortness of breath since discharge -2D echo completed 12/2021 with EF of 55-60%, with no RWMA, mild hypokinesis  -Cardiac CCT scan on 02/02/2022 that showed mild non-obstructive CADTA  -Will be started on isosorbide for chronic angina. -No  beta-blockers due to history of cocaine abuse  Home Medications    Current Outpatient Medications  Medication Sig Dispense Refill   albuterol (VENTOLIN HFA) 108 (90 Base) MCG/ACT inhaler Inhale 1-2 puffs into the lungs every 6 (six) hours as needed for wheezing or shortness of breath. 90 g 2   amLODipine (NORVASC) 10 MG tablet Take 1 tablet (10 mg total) by mouth daily. 90 tablet 2   ARIPiprazole (ABILIFY) 10 MG tablet Take by mouth.     aspirin 81 MG EC tablet Take 1 tablet (81 mg total) by mouth daily. 30 tablet 6   atorvastatin (LIPITOR) 80 MG tablet Take 1 tablet (80 mg total) by mouth daily. 90 tablet 3   diltiazem (TIAZAC) 240 MG 24 hr capsule Take 1 capsule (240 mg total) by mouth daily. 90 capsule 2   latanoprost (XALATAN) 0.005 % ophthalmic solution Place 1 drop into both eyes nightly. 2.5 mL 3   SYMBICORT 160-4.5 MCG/ACT inhaler Inhale 2 puffs into the lungs in the morning and at bedtime. 10.2 g 11   valsartan (DIOVAN) 160 MG tablet Take 1 tablet (160 mg total) by mouth daily. 90 tablet 3   No current facility-administered medications for this visit.     Review of Systems  Please see the history of present illness.    (+)*** (+)***  All other systems reviewed and are otherwise negative except as noted above.  Physical Exam    Wt Readings from Last 3 Encounters:  05/17/22 283 lb (128.4 kg)  05/11/22 289 lb 3.2 oz (131.2 kg)  01/19/22 (!) 300 lb 8 oz (136.3 kg)   DG:UYQIH were no vitals filed for this visit.,There is no height or weight on file to calculate BMI.  Constitutional:      Appearance: Healthy appearance. Not in distress.  Neck:     Vascular: JVD normal.  Pulmonary:     Effort: Pulmonary effort is normal.     Breath sounds: No wheezing. No rales. Diminished in the bases Cardiovascular:     Normal rate. Regular rhythm. Normal S1. Normal S2.      Murmurs: There is no murmur.  Edema:    Peripheral edema absent.  Abdominal:     Palpations: Abdomen is soft  non tender. There is no hepatomegaly.  Skin:    General: Skin is warm and dry.  Neurological:     General: No focal deficit present.     Mental Status: Alert and oriented to person, place and time.     Cranial Nerves: Cranial nerves are intact.  EKG/LABS/Other Studies Reviewed    ECG personally reviewed by me today - ***  Risk Assessment/Calculations:   {Does this patient have ATRIAL FIBRILLATION?[phone]}        Lab Results  Component Value Date   WBC 7.4 06/08/2022   HGB 13.2 06/08/2022   HCT 40.7 06/08/2022   MCV 93.6 06/08/2022   PLT 273 06/08/2022   Lab Results  Component Value Date   CREATININE 1.17 06/08/2022   BUN 10 06/08/2022  NA 138 06/08/2022   K 3.7 06/08/2022   CL 108 06/08/2022   CO2 21 (L) 06/08/2022   Lab Results  Component Value Date   ALT 35 05/17/2022   AST 21 05/17/2022   ALKPHOS 74 05/17/2022   BILITOT 0.6 05/17/2022   Lab Results  Component Value Date   CHOL 173 05/17/2022   HDL 53 05/17/2022   LDLCALC 104 (H) 05/17/2022   TRIG 89 05/17/2022   CHOLHDL 3.3 05/17/2022    Lab Results  Component Value Date   HGBA1C 4.8 09/07/2020    Assessment & Plan    1.  Coronary artery disease: -LHC revealing 30% prox LAD and mid LAD myocardial bridge and 20% mid RCA with normal LVF and normal LVEDP -Patient had coronary CTA on 01/2022 with a calcium score of 589 and nonobstructive CAD noted -Patient reports today*** -Continue GDMT ASA 81 mg daily, Lipitor 80 mg daily and currently not on beta-blocker due to history of cocaine abuse.     2.  Essential hypertension: -Blood pressure today was*** -Continue Norvasc 10 mg daily, and   3.  Hyperlipidemia: -Last LDL cholesterol was 104 above goal of less than 70   4.  Precordial chest pain: -Patient reports today***    5.  COPD: -Currently followed by pulmonology continue current treatment plan         Disposition: Follow-up with [name], MD or APP in *** months {Are you ordering  a CV Procedure (e.g. stress test, cath, DCCV, TEE, etc)?   Press F2        :[id number]}   Medication Adjustments/Labs and Tests Ordered: Current medicines are reviewed at length with the patient today.  Concerns regarding medicines are outlined above.   Signed, Mable Fill, Marissa Nestle, NP 06/21/2022, 8:08 PM [place]

## 2022-06-22 ENCOUNTER — Ambulatory Visit: Payer: Medicare HMO | Admitting: Cardiology

## 2022-06-22 ENCOUNTER — Ambulatory Visit: Payer: Medicare HMO | Admitting: Nurse Practitioner

## 2022-06-23 ENCOUNTER — Telehealth: Payer: Self-pay

## 2022-06-23 NOTE — Telephone Encounter (Signed)
Completed PCS referral faxed to [facility] °

## 2022-07-02 ENCOUNTER — Ambulatory Visit: Payer: Medicare HMO | Admitting: Podiatry

## 2022-07-14 ENCOUNTER — Telehealth: Payer: Self-pay

## 2022-07-14 ENCOUNTER — Other Ambulatory Visit: Payer: Medicare HMO

## 2022-07-14 NOTE — Telephone Encounter (Signed)
I spoke to [employer] [facility] and she explained that the patient was assessed for PCS on 06/30/2022 and was not approved for services.

## 2022-07-15 ENCOUNTER — Encounter: Payer: Self-pay | Admitting: Internal Medicine

## 2022-07-21 ENCOUNTER — Telehealth: Payer: Self-pay | Admitting: *Deleted

## 2022-07-21 NOTE — Telephone Encounter (Signed)
Message left with call back # concerning upcoming appointment,pt. Need an OV scheduled see TE.

## 2022-07-21 NOTE — Telephone Encounter (Signed)
This is a direct screening.

## 2022-07-21 NOTE — Telephone Encounter (Signed)
Please review chart and advise if pt. Ok to proceed with direct colonoscopy for [place] ON 08/23/22 ?

## 2022-07-22 NOTE — Telephone Encounter (Signed)
Sheila,  This pt is cleared for anesthetic care at [facility].  Thanks,  Tiegan Terpstra 

## 2022-07-26 NOTE — Telephone Encounter (Signed)
Hi Dr. Lorenso Courier,  Does this patient still need an OV?

## 2022-07-27 NOTE — Telephone Encounter (Signed)
Dr. Lorenso Courier I just want to clarify does this pt. Need a OV ?

## 2022-07-27 NOTE — Telephone Encounter (Signed)
Call placed message left for pt. To call back and schedule OV FOR DrSalli Quarry back number left.

## 2022-07-28 NOTE — Telephone Encounter (Signed)
OV scheduled for 09/06/22,location and check in area given to pt. Instructed to arrive 15 minutes prior to appointment time,explained to pt. That pre-visit and procedure are cancelled until after ov,verbalized understanding.

## 2022-08-02 ENCOUNTER — Ambulatory Visit (INDEPENDENT_AMBULATORY_CARE_PROVIDER_SITE_OTHER): Payer: Medicare HMO | Admitting: Podiatry

## 2022-08-02 ENCOUNTER — Encounter: Payer: Self-pay | Admitting: Podiatry

## 2022-08-02 ENCOUNTER — Ambulatory Visit: Payer: Self-pay

## 2022-08-02 DIAGNOSIS — M79675 Pain in left toe(s): Secondary | ICD-10-CM | POA: Diagnosis not present

## 2022-08-02 DIAGNOSIS — B351 Tinea unguium: Secondary | ICD-10-CM | POA: Diagnosis not present

## 2022-08-02 DIAGNOSIS — I739 Peripheral vascular disease, unspecified: Secondary | ICD-10-CM | POA: Diagnosis not present

## 2022-08-02 DIAGNOSIS — M79674 Pain in right toe(s): Secondary | ICD-10-CM | POA: Diagnosis not present

## 2022-08-02 DIAGNOSIS — Q828 Other specified congenital malformations of skin: Secondary | ICD-10-CM | POA: Diagnosis not present

## 2022-08-02 MED ORDER — FLUTICASONE PROPIONATE 50 MCG/ACT NA SUSP: 2.0000 | Freq: Every day | NASAL | 6 refills | Status: DC

## 2022-08-02 MED ORDER — AZELASTINE HCL 0.1 % NA SOLN: 2.0000 | Freq: Two times a day (BID) | NASAL | 12 refills | Status: DC

## 2022-08-02 NOTE — Progress Notes (Unsigned)
  Subjective:  Patient ID: Robert Chan, male    DOB: 03/23/1956,  MRN: [id number]  Jerryl Holzhauer presents to clinic today for:  Chief Complaint  Patient presents with   Nail Problem    Routine foot care PCP-Patrick Joya Gaskins PCP [facility]- couple months ago    Patient presents for for at risk foot care. Patient has h/o PAD and painful porokeratotic lesion(s) b/l lower extremities and painful mycotic toenails that limit ambulation. Painful toenails interfere with ambulation. Aggravating factors include wearing enclosed shoe gear. Pain is relieved with periodic professional debridement. Painful porokeratotic lesions are aggravated when weightbearing with and without shoegear. Pain is relieved with periodic professional debridement.  PCP is Elsie Stain, MD , and last visit was  May 11, 2022.  Allergies  Allergen Reactions   Shellfish Allergy Hives and Rash   Shellfish-Derived Products Rash    Other reaction(s): Other (See Comments)    Review of Systems: Negative except as noted in the HPI.  Objective: No changes noted in today's physical examination.  Robert Chan is a pleasant 66 y.o. male morbidly obese in NAD. AAO x 3. Vascular Examination: CFT <3 seconds b/l LE. Palpable DP pulse(s) b/l LE. Nonpalpable PT pulse(s) b/l LE. Pedal hair absent. No pain with calf compression b/l. Trace edema noted BLE. No ischemia or gangrene noted b/l LE. No cyanosis or clubbing noted b/l LE.  Neurological Examination: Protective sensation intact 5/5 intact bilaterally with 10g monofilament b/l. Vibratory sensation intact b/l.  Dermatological Examination: Pedal skin is warm and supple b/l LE. No open wounds b/l LE. No interdigital macerations noted b/l LE. Toenails 1-5 bilaterally elongated, discolored, dystrophic, thickened, and crumbly with subungual debris and tenderness to dorsal palpation. Porokeratotic lesion(s) submet head 5 b/l. No erythema, no edema, no drainage, no  fluctuance.  Musculoskeletal Examination: Muscle strength 5/5 to all lower extremity muscle groups bilaterally. Plantarflexed metatarsal(s) 5th metatarsal head b/l lower extremities.  Radiographs: None Assessment/Plan: 1. Pain due to onychomycosis of toenails of both feet   2. Porokeratosis   3. PAD (peripheral artery disease) (HCC)     No orders of the defined types were placed in this encounter.   -Medicaid ABN on file for paring of corn(s)/callus(es)/porokeratos(es). Copy in patient chart. -Patient/POA to call should there be question/concern in the interim.   Return in about 3 months (around 11/02/2022).  Marzetta Board, DPM

## 2022-08-02 NOTE — Telephone Encounter (Signed)
Patient called, left VM to return the call to the office to discuss symptoms with a nurse.  Summary: Mold advice   Pt is calling ask advice - pt has been sneezing constantly and recently found out that mold is in his apt. Pt would like to know should he be checked out. Please advise

## 2022-08-02 NOTE — Telephone Encounter (Signed)
Summary: Mold advice   Pt is calling ask advice - pt has been sneezing constantly and recently found out that mold is in his apt. Pt would like to know should he be checked out. Please advise        Chief Complaint: constant sneezing , mold found in apartment. Requesting if medication needed.  Symptoms: constant sneezing. Hx COPD, asthma  Frequency: 1-2 weeks  Pertinent Negatives: Patient denies chest pain no difficutly breathing. No fever Disposition: '[]'$ ED /'[]'$ Urgent Care (no appt availability in office) / '[]'$ Appointment(In office/virtual)/ '[]'$  [facility]/ '[]'$ Home Care/ '[]'$ Refused Recommended Disposition /'[]'$ [facility]/ '[x]'$  Follow-up with PCP Additional Notes:   Requesting if medication needed. Exposed to mold in apartment and noted mold on leather coat and in apartment. Patient report he cleaned coat  and mold noted to grow back. No available appt. Please advise. Recommended if sx worsen go to ED.       Reason for Disposition  Causes of nasal allergies (allergens), questions about  Answer Assessment - Initial Assessment Questions 1. RESPIRATORY STATUS: "Describe your breathing?" (e.g., wheezing, shortness of breath, unable to speak, severe coughing)      Breathing is "ok". Constant sneezing  2. ONSET: "When did this asthma attack begin?"      *No Answer* 3. TRIGGER: "What do you think triggered this attack?" (e.g., URI, exposure to pollen or other allergen, tobacco smoke)      *No Answer* 4. PEAK EXPIRATORY FLOW RATE (PEFR): "Do you use a peak flow meter?" If Yes, ask: "What's the current peak flow? What's your personal best peak flow?"      *No Answer* 5. SEVERITY: "How bad is this attack?"    - MILD: No SOB at rest, mild SOB with walking, speaks normally in sentences, can lie down, no retractions, pulse < 100. (GREEN Zone: PEFR 80-100%)   - MODERATE: SOB at rest, SOB with minimal exertion and prefers to sit, cannot lie down flat, speaks in phrases, mild  retractions, audible wheezing, pulse 100-120. (YELLOW Zone: PEFR 50-79%)    - SEVERE: Struggling for each breath, speaks in single words, struggling to breathe, sitting hunched forward, retractions, usually loud wheezing, sometimes minimal wheezing because of decreased air movement, pulse > 120. (RED Zone: PEFR < 50%).      *No Answer* 6. ASTHMA MEDICINES:  "What treatments have you tried?"    - INHALED QUICK RELIEF (RESCUE): "What is your inhaled quick-relief medicine?" (e.g., albuterol, salbutamol) "Do you use an inhaler or a nebulizer?" "How frequently have you been using this medicine?"   - CONTROLLER (LONG-TERM-CONTROL): "Do you take an inhaled steroid? (e.g., Asmanex, Flovent, Pulmicort, Qvar)     *No Answer* 7. INHALED QUICK-RELIEF TREATMENTS FOR THIS ATTACK: "What treatments have you given yourself so far?" and "How many and how often?" If using an inhaler, ask, "How many puffs?" Note: Routine treatments are 2 puffs every 4 hours as needed. Rescue treatments are 4 puffs repeated every 20 minutes, up to three times as needed.      *No Answer* 8. OTHER SYMPTOMS: "Do you have any other symptoms? (e.g., chest pain, coughing up yellow sputum, fever, runny nose)     *No Answer* 9. O2 SATURATION MONITOR:  "Do you use an oxygen saturation monitor (pulse oximeter) at home?" If Yes, "What is your reading (oxygen level) today?" "What is your usual oxygen saturation reading?" (e.g., 95%)     na 10. PREGNANCY: "Is there any chance you are pregnant?" "When was your  last menstrual period?"       na  Answer Assessment - Initial Assessment Questions 1. SYMPTOM: "What's the main symptom you're concerned about?" (e.g., runny nose, stuffiness, sneezing, itching)     Constant sneezing  2. SEVERITY: "How bad is it?" "What does it keep you from doing?" (e.g., sleeping, working)      Constantly sneezing x 1-2 weeks  3. EYES: "Are the eyes also red, watery, and itchy?"      na 4. TRIGGER: "What pollen or other  allergic substance do you think is causing the symptoms?"      Mold found in apartment  5. TREATMENT: "What medicine are you using?" "What medicine worked best in the past?"     na 6. OTHER SYMPTOMS: "Do you have any other symptoms?" (e.g., coughing, difficulty breathing, wheezing)     Sneezing  hx COPD , asthma 7. PREGNANCY: "Is there any chance you are pregnant?" "When was your last menstrual period?"     na  Protocols used: Asthma Attack-A-AH, Nasal Allergies (Hay Fever)-A-AH

## 2022-08-02 NOTE — Telephone Encounter (Signed)
I sent two nasal sprays to pharmacy, call back if worse

## 2022-08-09 ENCOUNTER — Emergency Department (HOSPITAL_COMMUNITY)
Admission: EM | Admit: 2022-08-09 | Discharge: 2022-08-11 | Disposition: A | Discharge status: Other Acute Inpatient Hospital | Payer: Medicare HMO | Attending: Emergency Medicine | Admitting: Emergency Medicine

## 2022-08-09 ENCOUNTER — Emergency Department (HOSPITAL_COMMUNITY): Payer: Medicare HMO

## 2022-08-09 ENCOUNTER — Encounter (HOSPITAL_COMMUNITY): Payer: Self-pay | Admitting: Emergency Medicine

## 2022-08-09 DIAGNOSIS — F322 Major depressive disorder, single episode, severe without psychotic features: Secondary | ICD-10-CM | POA: Diagnosis not present

## 2022-08-09 DIAGNOSIS — R441 Visual hallucinations: Secondary | ICD-10-CM | POA: Insufficient documentation

## 2022-08-09 DIAGNOSIS — J449 Chronic obstructive pulmonary disease, unspecified: Secondary | ICD-10-CM | POA: Insufficient documentation

## 2022-08-09 DIAGNOSIS — Z79899 Other long term (current) drug therapy: Secondary | ICD-10-CM | POA: Insufficient documentation

## 2022-08-09 DIAGNOSIS — R058 Other specified cough: Secondary | ICD-10-CM | POA: Insufficient documentation

## 2022-08-09 DIAGNOSIS — I2510 Atherosclerotic heart disease of native coronary artery without angina pectoris: Secondary | ICD-10-CM | POA: Diagnosis not present

## 2022-08-09 DIAGNOSIS — R079 Chest pain, unspecified: Secondary | ICD-10-CM | POA: Insufficient documentation

## 2022-08-09 DIAGNOSIS — I10 Essential (primary) hypertension: Secondary | ICD-10-CM | POA: Insufficient documentation

## 2022-08-09 DIAGNOSIS — Z7982 Long term (current) use of aspirin: Secondary | ICD-10-CM | POA: Diagnosis not present

## 2022-08-09 DIAGNOSIS — R0789 Other chest pain: Secondary | ICD-10-CM | POA: Diagnosis not present

## 2022-08-09 DIAGNOSIS — Z1152 Encounter for screening for COVID-19: Secondary | ICD-10-CM | POA: Diagnosis not present

## 2022-08-09 DIAGNOSIS — J45909 Unspecified asthma, uncomplicated: Secondary | ICD-10-CM | POA: Insufficient documentation

## 2022-08-09 DIAGNOSIS — Y900 Blood alcohol level of less than 20 mg/100 ml: Secondary | ICD-10-CM | POA: Diagnosis not present

## 2022-08-09 DIAGNOSIS — R45851 Suicidal ideations: Secondary | ICD-10-CM | POA: Insufficient documentation

## 2022-08-09 DIAGNOSIS — R69 Illness, unspecified: Secondary | ICD-10-CM | POA: Diagnosis not present

## 2022-08-09 LAB — COMPREHENSIVE METABOLIC PANEL
ALT: 21 U/L (ref 0–44)
AST: 22 U/L (ref 15–41)
Albumin: 3.6 g/dL (ref 3.5–5.0)
Alkaline Phosphatase: 69 U/L (ref 38–126)
Anion gap: 10 (ref 5–15)
BUN: 10 mg/dL (ref 8–23)
CO2: 21 mmol/L — ABNORMAL LOW (ref 22–32)
Calcium: 9.2 mg/dL (ref 8.9–10.3)
Chloride: 104 mmol/L (ref 98–111)
Creatinine, Ser: 1.33 mg/dL — ABNORMAL HIGH (ref 0.61–1.24)
GFR, Estimated: 59 mL/min — ABNORMAL LOW (ref >60)
Glucose, Bld: 114 mg/dL — ABNORMAL HIGH (ref 70–99)
Potassium: 3.9 mmol/L (ref 3.5–5.1)
Sodium: 135 mmol/L (ref 135–145)
Total Bilirubin: 1.1 mg/dL (ref 0.3–1.2)
Total Protein: 7.3 g/dL (ref 6.5–8.1)

## 2022-08-09 LAB — CBC
HCT: 40.2 % (ref 39.0–52.0)
Hemoglobin: 13.5 g/dL (ref 13.0–17.0)
MCH: 31.7 pg (ref 26.0–34.0)
MCHC: 33.6 g/dL (ref 30.0–36.0)
MCV: 94.4 fL (ref 80.0–100.0)
Platelets: 289 10*3/uL (ref 150–400)
RBC: 4.26 MIL/uL (ref 4.22–5.81)
RDW: 12.5 % (ref 11.5–15.5)
WBC: 10.9 10*3/uL — ABNORMAL HIGH (ref 4.0–10.5)
nRBC: 0 % (ref 0.0–0.2)

## 2022-08-09 LAB — RAPID URINE DRUG SCREEN, HOSP PERFORMED
Amphetamines: NOT DETECTED
Barbiturates: NOT DETECTED
Benzodiazepines: NOT DETECTED
Cocaine: POSITIVE — AB
Opiates: NOT DETECTED
Tetrahydrocannabinol: NOT DETECTED

## 2022-08-09 LAB — SALICYLATE LEVEL: Salicylate Lvl: <7 mg/dL — ABNORMAL LOW (ref 7.0–30.0)

## 2022-08-09 LAB — ETHANOL: Alcohol, Ethyl (B): <10 mg/dL (ref <10)

## 2022-08-09 LAB — TROPONIN I (HIGH SENSITIVITY)
Troponin I (High Sensitivity): 23 ng/L — ABNORMAL HIGH (ref <18)
Troponin I (High Sensitivity): 20 ng/L — ABNORMAL HIGH (ref <18)

## 2022-08-09 LAB — ACETAMINOPHEN LEVEL: Acetaminophen (Tylenol), Serum: <10 ug/mL — ABNORMAL LOW (ref 10–30)

## 2022-08-09 NOTE — ED Notes (Signed)
Pt belongings inventoried and placed in locker 6. Valuables w/ security. Belongings in locker: shoes, socks, hat, sunglasses, inhaler, shirt, hoodie, pants, belt. Valuables w/ security: 55 cents, cell phone w/ cracked screen and keys.

## 2022-08-09 NOTE — ED Notes (Signed)
IVC paperwork complete and in orange zone

## 2022-08-09 NOTE — ED Provider Notes (Signed)
[facility] EMERGENCY DEPARTMENT Provider Note   CSN: [id number] Arrival date & time: 08/09/22  1810     History  Chief Complaint  Patient presents with   Chest Pain   Suicidal    Robert Chan is a 66 y.o. male with history of asthma, hypertension, COPD, glaucoma, NSTEMI, CAD, and cocaine use disorder who presents the emergency department complaining of chest pain and suicidal ideation.  Patient states has been having some chest pressure for the past few days, that he attributed to having mold in his house.  Is also been having some cough and congestion.  States that he has had "mental health problems", with thoughts of suicide.  When asked about a plan he states that he has "a couple bottle pills left that I can take to kill myself".  Reports intermittent homicidal ideation, with no active plan.  Also reports intermittent visual hallucinations in which he thinks he is seeing things that are not really there, but cannot specify further.  No auditory hallucinations.   Chest Pain Associated symptoms: cough   Associated symptoms: no abdominal pain, no fever, no nausea, no shortness of breath and no vomiting        Home Medications Prior to Admission medications   Medication Sig Start Date End Date Taking? Authorizing Provider  albuterol (VENTOLIN HFA) 108 (90 Base) MCG/ACT inhaler Inhale 1-2 puffs into the lungs every 6 (six) hours as needed for wheezing or shortness of breath. 01/11/22   Elsie Stain, MD  amLODipine (NORVASC) 10 MG tablet Take 1 tablet (10 mg total) by mouth daily. 01/11/22   Elsie Stain, MD  ARIPiprazole (ABILIFY) 10 MG tablet Take by mouth. 12/25/20   [provider]  aspirin 81 MG EC tablet Take 1 tablet (81 mg total) by mouth daily. 01/15/22   Charlott Rakes, MD  atorvastatin (LIPITOR) 80 MG tablet Take 1 tablet (80 mg total) by mouth daily. 05/21/22 05/22/23  Swinyer, Lanice Schwab, NP  azelastine (ASTELIN) 0.1 % nasal spray Place  2 sprays into both nostrils 2 (two) times daily. Use in each nostril as directed 08/02/22   Elsie Stain, MD  diltiazem [facility]) 240 MG 24 hr capsule Take 1 capsule (240 mg total) by mouth daily. 01/11/22   Elsie Stain, MD  fluticasone (FLONASE) 50 MCG/ACT nasal spray Place 2 sprays into both nostrils daily. 08/02/22   Elsie Stain, MD  latanoprost (XALATAN) 0.005 % ophthalmic solution Place 1 drop into both eyes nightly. 01/11/22   Elsie Stain, MD  SYMBICORT 160-4.5 MCG/ACT inhaler Inhale 2 puffs into the lungs in the morning and at bedtime. 09/28/21   Elsie Stain, MD  valsartan (DIOVAN) 160 MG tablet Take 1 tablet (160 mg total) by mouth daily. 01/11/22   Elsie Stain, MD      Allergies    Shellfish allergy and Shellfish-derived products    Review of Systems   Review of Systems  Constitutional:  Negative for chills and fever.  HENT:  Positive for congestion.   Respiratory:  Positive for cough. Negative for shortness of breath and wheezing.   Cardiovascular:  Positive for chest pain.  Gastrointestinal:  Negative for abdominal pain, nausea and vomiting.  Psychiatric/Behavioral:  Positive for hallucinations and suicidal ideas.   All other systems reviewed and are negative.   Physical Exam Updated Vital Signs BP (!) 137/98   Pulse 100   Temp 98 F (36.7 C)   Resp 18   SpO2  95%  Physical Exam Vitals and nursing note reviewed.  Constitutional:      Appearance: Normal appearance.  HENT:     Head: Normocephalic and atraumatic.  Eyes:     Conjunctiva/sclera: Conjunctivae normal.  Cardiovascular:     Rate and Rhythm: Normal rate and regular rhythm.  Pulmonary:     Effort: Pulmonary effort is normal. No respiratory distress.     Breath sounds: Normal breath sounds.  Abdominal:     General: There is no distension.     Palpations: Abdomen is soft.     Tenderness: There is no abdominal tenderness.  Skin:    General: Skin is warm and dry.  Neurological:      General: No focal deficit present.     Mental Status: He is alert.  Psychiatric:        Attention and Perception: He perceives visual hallucinations. He does not perceive auditory hallucinations.        Mood and Affect: Mood normal.        Speech: Speech normal.        Behavior: Behavior normal. Behavior is cooperative.        Thought Content: Thought content is not paranoid. Thought content includes homicidal and suicidal ideation. Thought content includes suicidal plan. Thought content does not include homicidal plan.        Cognition and Memory: Cognition normal.     ED Results / Procedures / Treatments   Labs (all labs ordered are listed, but only abnormal results are displayed) Labs Reviewed  COMPREHENSIVE METABOLIC PANEL - Abnormal; Notable for the following components:      Result Value   CO2 21 (*)    Glucose, Bld 114 (*)    Creatinine, Ser 1.33 (*)    GFR, Estimated 59 (*)    All other components within normal limits  SALICYLATE LEVEL - Abnormal; Notable for the following components:   Salicylate Lvl <6.9 (*)    All other components within normal limits  ACETAMINOPHEN LEVEL - Abnormal; Notable for the following components:   Acetaminophen (Tylenol), Serum <10 (*)    All other components within normal limits  CBC - Abnormal; Notable for the following components:   WBC 10.9 (*)    All other components within normal limits  RAPID URINE DRUG SCREEN, HOSP PERFORMED - Abnormal; Notable for the following components:   Cocaine POSITIVE (*)    All other components within normal limits  TROPONIN I (HIGH SENSITIVITY) - Abnormal; Notable for the following components:   Troponin I (High Sensitivity) 23 (*)    All other components within normal limits  RESP PANEL BY RT-PCR (FLU A&B, COVID) ARPGX2  ETHANOL  TROPONIN I (HIGH SENSITIVITY)    EKG None  Radiology DG Chest 2 View  Result Date: 08/09/2022 CLINICAL DATA:  chest pain EXAM: CHEST - 2 VIEW COMPARISON:  Chest  x-ray 06/08/2022. FINDINGS: The heart and mediastinal contours are unchanged. No focal consolidation. No pulmonary edema. No pleural effusion. No pneumothorax. No acute osseous abnormality. IMPRESSION: No active cardiopulmonary disease. Electronically Signed   By: Iven Finn M.D.   On: 08/09/2022 19:22    Procedures Procedures    Medications Ordered in ED Medications - No data to display  ED Course/ Medical Decision Making/ A&P                           Medical Decision Making Amount and/or Complexity of Data Reviewed Labs: ordered. Radiology:  ordered.  Patient is a 66 year old male who presents to the emergency department for psychiatric complaint.  Past Medical History: asthma, hypertension, COPD, glaucoma, NSTEMI, CAD, and cocaine use disorder  Physical Exam: Normal vital signs. In no acute distress. Active SI with plan to overdose.   Labs: Medical clearance labs ordered. Results are pending. Initial troponin elevated to 23. Hx of cocaine use disorder and coronary vasospasm. Delta troponin pending.   Disposition: Patient is otherwise medically cleared at this time pending medical clearance laboratory evaluation. Will consult TTS and appreciate their recommendations. Due to severity of suicidal ideation with plan, felt that patient met criteria for involuntary commitment.   I discussed this case with my attending physician Dr. Armandina Gemma who cosigned this note including patient's presenting symptoms, physical exam, and planned diagnostics and interventions. Attending physician stated agreement with plan or made changes to plan which were implemented.  Attending physician plans to follow up on delta troponin. May possibly need cardiology consult, but suspect elevated troponin due to vasospasm in the setting of cocaine use.   Final Clinical Impression(s) / ED Diagnoses Final diagnoses:  Suicidal ideation  Chest pain, unspecified type    Rx / DC Orders ED Discharge Orders      None      Portions of this report may have been transcribed using voice recognition software. Every effort was made to ensure accuracy; however, inadvertent computerized transcription errors may be present.    Estill Cotta 08/09/22 2209    Regan Lemming, MD 08/09/22 2311

## 2022-08-09 NOTE — ED Notes (Signed)
IVC paperwork complete and in green zone

## 2022-08-09 NOTE — ED Triage Notes (Signed)
Patient here with complaint of chest pressure that started a few days ago, also complains of having suicidal thoughts. Patient states "I had a couple of bottle of pills I thought I could take to kill myself."

## 2022-08-10 DIAGNOSIS — R45851 Suicidal ideations: Secondary | ICD-10-CM | POA: Diagnosis not present

## 2022-08-10 DIAGNOSIS — R69 Illness, unspecified: Secondary | ICD-10-CM | POA: Diagnosis not present

## 2022-08-10 DIAGNOSIS — F322 Major depressive disorder, single episode, severe without psychotic features: Secondary | ICD-10-CM | POA: Diagnosis not present

## 2022-08-10 HISTORY — DX: Major depressive disorder, single episode, severe without psychotic features: F32.2

## 2022-08-10 LAB — RESP PANEL BY RT-PCR (FLU A&B, COVID) ARPGX2
Influenza A by PCR: NEGATIVE
Influenza B by PCR: NEGATIVE
SARS Coronavirus 2 by RT PCR: NEGATIVE

## 2022-08-10 MED ORDER — MIRTAZAPINE 15 MG PO TABS
15.0000 mg | ORAL_TABLET | Freq: Every day | ORAL | Status: DC
  Administered 2022-08-11: 15 mg via ORAL
  Filled 2022-08-10 (×2): qty 1

## 2022-08-10 MED ORDER — ACETAMINOPHEN 500 MG PO TABS
1000.0000 mg | ORAL_TABLET | Freq: Four times a day (QID) | ORAL | Status: DC | PRN
  Administered 2022-08-10: 1000 mg via ORAL
  Filled 2022-08-10: qty 2

## 2022-08-10 NOTE — ED Notes (Signed)
PT moved to room 36 for TTS

## 2022-08-10 NOTE — ED Provider Notes (Signed)
Emergency Medicine Observation Re-evaluation Note  Robert Chan is a 67 y.o. male, seen on rounds today.  Pt initially presented to the ED for complaints of Chest Pain and Suicidal Currently, the patient is seen comfortably in the hallway.  Physical Exam  BP (!) 140/65 (BP Location: Left Arm)   Pulse 65   Temp (!) 97.5 F (36.4 C) (Oral)   Resp 18   SpO2 100%  Physical Exam General: Alert, calm cooperative  Psych: Endorses SI  ED Course / MDM  EKG:   I have reviewed the labs performed to date as well as medications administered while in observation.  Recent changes in the last 24 hours include patient evaluated for possible cardiac etiology and work-up was negative.  Patient describes his pain as sharp sharp and worse with movement and better with rest.  Not typical for ACS.  Troponins were negative.  Chest x-ray also negative.  EKG showed no acute ischemic changes..  Plan  Current plan is for patient is medically clear for psychiatric disposition.Lacretia Leigh, MD 08/10/22 1243

## 2022-08-10 NOTE — ED Notes (Signed)
PT back from TTS

## 2022-08-10 NOTE — Progress Notes (Signed)
Report called from [facility] on patient coming to room L26A.

## 2022-08-10 NOTE — ED Notes (Signed)
IVC Papers are in blue, left a message for sheriff tx

## 2022-08-10 NOTE — ED Notes (Signed)
IVC documentation retrieved from orange zone and placed in blue zone secretary desk in stand up. Informed RN of location. IVC'd 08/09/22, expires 08/16/22.
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
## 2022-08-10 NOTE — ED Notes (Signed)
Patient ambulatory to restroom. Patient calm and cooperative.

## 2022-08-10 NOTE — ED Notes (Signed)
Secretary to fax IVC paperwork to [facility] and call sheriff's office for transport
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
## 2022-08-10 NOTE — ED Notes (Signed)
PASSING OFF IVC PAPERWORK TO [facility]

## 2022-08-10 NOTE — ED Notes (Signed)
Report given to RN at [facility]

## 2022-08-10 NOTE — ED Notes (Signed)
Dinner tray delivered.

## 2022-08-10 NOTE — Progress Notes (Signed)
Pt was accepted to [facility] Unit: Bed Assignment L26A  Please have him sign a voluntary admission form and fax a copy to [phone]  Pt meets inpatient criteria per Vesta Mixer, NP  Attending Physician will be Alethia Berthold  Report can be called to: [phone].   Pt can arrive after: Transportation is coordinated and charge RN is notified  Care Team notified: Janalyn Harder, RN, Caroline Sauger, NP, Emerson Monte, RN, Alethia Berthold, MD, Everette Rank, Shimeles Kenney Houseman, RN, Leona Singleton, RN, Maxie Better, RN, Lurline Del, NP,Marie Autumn Messing, RN, Vesta Mixer, NP, Caren Griffins, [place], LCSWA 08/10/2022 @ 10:29 PM

## 2022-08-11 ENCOUNTER — Inpatient Hospital Stay
Admission: AD | Admit: 2022-08-11 | Discharge: 2022-08-18 | DRG: 885 | Disposition: A | Discharge status: Home / Self Care | Payer: Medicare HMO | Source: Intra-hospital | Attending: Psychiatry | Admitting: Psychiatry

## 2022-08-11 ENCOUNTER — Encounter: Payer: Self-pay | Admitting: Psychiatry

## 2022-08-11 ENCOUNTER — Other Ambulatory Visit: Payer: Self-pay

## 2022-08-11 DIAGNOSIS — J45909 Unspecified asthma, uncomplicated: Secondary | ICD-10-CM | POA: Diagnosis not present

## 2022-08-11 DIAGNOSIS — I10 Essential (primary) hypertension: Secondary | ICD-10-CM | POA: Diagnosis not present

## 2022-08-11 DIAGNOSIS — R45850 Homicidal ideations: Secondary | ICD-10-CM | POA: Diagnosis present

## 2022-08-11 DIAGNOSIS — H409 Unspecified glaucoma: Secondary | ICD-10-CM | POA: Diagnosis not present

## 2022-08-11 DIAGNOSIS — F332 Major depressive disorder, recurrent severe without psychotic features: Secondary | ICD-10-CM | POA: Diagnosis not present

## 2022-08-11 DIAGNOSIS — R441 Visual hallucinations: Secondary | ICD-10-CM | POA: Diagnosis not present

## 2022-08-11 DIAGNOSIS — Z1152 Encounter for screening for COVID-19: Secondary | ICD-10-CM | POA: Diagnosis not present

## 2022-08-11 DIAGNOSIS — G4700 Insomnia, unspecified: Secondary | ICD-10-CM | POA: Diagnosis not present

## 2022-08-11 DIAGNOSIS — R45851 Suicidal ideations: Secondary | ICD-10-CM | POA: Diagnosis not present

## 2022-08-11 DIAGNOSIS — J4489 Other specified chronic obstructive pulmonary disease: Secondary | ICD-10-CM | POA: Diagnosis not present

## 2022-08-11 DIAGNOSIS — R079 Chest pain, unspecified: Secondary | ICD-10-CM | POA: Diagnosis not present

## 2022-08-11 DIAGNOSIS — K5900 Constipation, unspecified: Secondary | ICD-10-CM | POA: Diagnosis present

## 2022-08-11 DIAGNOSIS — Z9151 Personal history of suicidal behavior: Secondary | ICD-10-CM

## 2022-08-11 DIAGNOSIS — J410 Simple chronic bronchitis: Secondary | ICD-10-CM

## 2022-08-11 DIAGNOSIS — I129 Hypertensive chronic kidney disease with stage 1 through stage 4 chronic kidney disease, or unspecified chronic kidney disease: Secondary | ICD-10-CM | POA: Diagnosis not present

## 2022-08-11 DIAGNOSIS — F333 Major depressive disorder, recurrent, severe with psychotic symptoms: Secondary | ICD-10-CM | POA: Diagnosis not present

## 2022-08-11 DIAGNOSIS — F322 Major depressive disorder, single episode, severe without psychotic features: Secondary | ICD-10-CM | POA: Diagnosis not present

## 2022-08-11 DIAGNOSIS — J449 Chronic obstructive pulmonary disease, unspecified: Secondary | ICD-10-CM | POA: Diagnosis not present

## 2022-08-11 DIAGNOSIS — R058 Other specified cough: Secondary | ICD-10-CM | POA: Diagnosis not present

## 2022-08-11 DIAGNOSIS — R69 Illness, unspecified: Secondary | ICD-10-CM | POA: Diagnosis not present

## 2022-08-11 DIAGNOSIS — Z87891 Personal history of nicotine dependence: Secondary | ICD-10-CM

## 2022-08-11 DIAGNOSIS — Z7982 Long term (current) use of aspirin: Secondary | ICD-10-CM | POA: Diagnosis not present

## 2022-08-11 DIAGNOSIS — I2510 Atherosclerotic heart disease of native coronary artery without angina pectoris: Secondary | ICD-10-CM | POA: Diagnosis not present

## 2022-08-11 DIAGNOSIS — I4891 Unspecified atrial fibrillation: Secondary | ICD-10-CM | POA: Diagnosis not present

## 2022-08-11 DIAGNOSIS — Z79899 Other long term (current) drug therapy: Secondary | ICD-10-CM | POA: Diagnosis not present

## 2022-08-11 HISTORY — DX: Major depressive disorder, recurrent severe without psychotic features: F33.2

## 2022-08-11 MED ORDER — ALUM & MAG HYDROXIDE-SIMETH 200-200-20 MG/5ML PO SUSP: 30.0000 mL | ORAL | Status: DC | PRN

## 2022-08-11 MED ORDER — LORAZEPAM 1 MG PO TABS: 1.0000 mg | ORAL_TABLET | ORAL | Status: DC | PRN

## 2022-08-11 MED ORDER — MAGNESIUM HYDROXIDE 400 MG/5ML PO SUSP
30.0000 mL | Freq: Every day | ORAL | Status: DC | PRN
  Administered 2022-08-12 – 2022-08-13 (×2): 30 mL via ORAL
  Filled 2022-08-11 (×2): qty 30

## 2022-08-11 MED ORDER — ATORVASTATIN CALCIUM 80 MG PO TABS
80.0000 mg | ORAL_TABLET | Freq: Every day | ORAL | Status: DC
  Administered 2022-08-11 – 2022-08-18 (×8): 80 mg via ORAL
  Filled 2022-08-11 (×8): qty 1

## 2022-08-11 MED ORDER — ALBUTEROL SULFATE HFA 108 (90 BASE) MCG/ACT IN AERS
1.0000 | INHALATION_SPRAY | RESPIRATORY_TRACT | Status: DC
  Filled 2022-08-11: qty 6.7

## 2022-08-11 MED ORDER — DILTIAZEM HCL ER COATED BEADS 240 MG PO CP24
240.0000 mg | ORAL_CAPSULE | Freq: Every day | ORAL | Status: DC
  Administered 2022-08-11 – 2022-08-18 (×8): 240 mg via ORAL
  Filled 2022-08-11 (×8): qty 1

## 2022-08-11 MED ORDER — ACETAMINOPHEN 325 MG PO TABS: 650.0000 mg | ORAL_TABLET | Freq: Four times a day (QID) | ORAL | Status: DC | PRN

## 2022-08-11 MED ORDER — OLANZAPINE 5 MG PO TABS: 10.0000 mg | ORAL_TABLET | Freq: Four times a day (QID) | ORAL | Status: DC | PRN

## 2022-08-11 MED ORDER — TRAZODONE HCL 50 MG PO TABS
150.0000 mg | ORAL_TABLET | Freq: Every evening | ORAL | Status: DC | PRN
  Administered 2022-08-11: 150 mg via ORAL
  Filled 2022-08-11: qty 1

## 2022-08-11 MED ORDER — LATANOPROST 0.005 % OP SOLN
1.0000 [drp] | Freq: Every day | OPHTHALMIC | Status: DC
  Administered 2022-08-11 – 2022-08-17 (×7): 1 [drp] via OPHTHALMIC
  Filled 2022-08-11: qty 2.5

## 2022-08-11 MED ORDER — ARIPIPRAZOLE 5 MG PO TABS
10.0000 mg | ORAL_TABLET | Freq: Every day | ORAL | Status: DC
  Administered 2022-08-11 – 2022-08-18 (×8): 10 mg via ORAL
  Filled 2022-08-11 (×8): qty 2

## 2022-08-11 MED ORDER — ALBUTEROL SULFATE HFA 108 (90 BASE) MCG/ACT IN AERS
1.0000 | INHALATION_SPRAY | Freq: Four times a day (QID) | RESPIRATORY_TRACT | Status: DC | PRN
  Administered 2022-08-13 – 2022-08-16 (×4): 2 via RESPIRATORY_TRACT

## 2022-08-11 MED ORDER — ALBUTEROL SULFATE (2.5 MG/3ML) 0.083% IN NEBU: 2.5000 mg | INHALATION_SOLUTION | Freq: Four times a day (QID) | RESPIRATORY_TRACT | Status: DC | PRN

## 2022-08-11 NOTE — Group Note (Signed)
[facility] LCSW Group Therapy Note   Group Date: 08/11/2022 Start Time: 9509 End Time: 1415   Type of Therapy/Topic:  Group Therapy:  Emotion Regulation  Participation Level:  Active   Mood:  Description of Group:    The purpose of this group is to assist patients in learning to regulate negative emotions and experience positive emotions. Patients will be guided to discuss ways in which they have been vulnerable to their negative emotions. These vulnerabilities will be juxtaposed with experiences of positive emotions or situations, and patients challenged to use positive emotions to combat negative ones. Special emphasis will be placed on coping with negative emotions in conflict situations, and patients will process healthy conflict resolution skills.  Therapeutic Goals: Patient will identify two positive emotions or experiences to reflect on in order to balance out negative emotions:  Patient will label two or more emotions that they find the most difficult to experience:  Patient will be able to demonstrate positive conflict resolution skills through discussion or role plays:   Summary of Patient Progress: Patient was the only patient in attendance.  Patient did not identify any concerns.   Therapeutic Modalities:   Cognitive Behavioral Therapy Feelings Identification Dialectical Behavioral Therapy   Rozann Lesches, LCSW

## 2022-08-11 NOTE — ED Notes (Signed)
Patient lying in bed in NAD. Respirations equal and unlabored
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
## 2022-08-11 NOTE — ED Notes (Signed)
Review of patient clipboard documentation currently located in [place] until Network engineer arrives in [place], indicates IVC'd 08/09/22, expires 08/16/22, and transfer pending to [facility] this morning.

## 2022-08-11 NOTE — ED Notes (Signed)
IVC paperwork given to [facility]

## 2022-08-11 NOTE — Progress Notes (Signed)
Pt has been accepted to [facility]. Pt unable to be transported via night shift. CSW to follow up with transfer to [facility].   Benjaman Kindler, MSW, LCSWA 08/11/2022 1:42 AM

## 2022-08-11 NOTE — ED Notes (Signed)
Secretary to call [employer] to inquire about transfer. Sheriffs office will not transport pt tonight and can begin transport at 8am tomorrow. [place] notified and said that is fine for pt to come in AM

## 2022-08-11 NOTE — Tx Team (Signed)
Initial Treatment Plan 08/11/2022 2:29 PM [name] KRC:[id number]    PATIENT STRESSORS: Medication change or noncompliance     PATIENT STRENGTHS: Motivation for treatment/growth  Supportive family/friends    PATIENT IDENTIFIED PROBLEMS:                      DISCHARGE CRITERIA:  Ability to meet basic life and health needs Adequate post-discharge living arrangements Improved stabilization in mood, thinking, and/or behavior  PRELIMINARY DISCHARGE PLAN: Return to previous living arrangement  PATIENT/FAMILY INVOLVEMENT: This treatment plan has been presented to and reviewed with the patient, Maxamillion Banas. The patient and family has been given the opportunity to ask questions and make suggestions.  Nolon Bussing, RN 08/11/2022, 2:29 PM

## 2022-08-11 NOTE — Progress Notes (Signed)
   08/11/22 1945  Psych Admission Type (Psych Patients Only)  Admission Status Involuntary  Psychosocial Assessment  Patient Complaints None  Eye Contact Fair  Facial Expression Anxious  Affect Appropriate to circumstance  Speech Logical/coherent  Interaction Assertive  Motor Activity Other (Comment) (wnl)  Appearance/Hygiene Unremarkable;In scrubs  Behavior Characteristics Cooperative;Calm  Mood Pleasant  Thought Process  Coherency WDL  Content WDL  Delusions None reported or observed  Perception WDL  Hallucination None reported or observed  Judgment WDL  Confusion None  Danger to Self  Current suicidal ideation? Denies  Agreement Not to Harm Self Yes  Description of Agreement verbal  Danger to Others  Danger to Others None reported or observed   Progress note   D: Pt seen in dayroom watching television. Pt denies SI, HI, AVH. Pt contracts for safety. Pt rates pain  0/10. Pt rates anxiety  0/10 and depression  0/10. Pt has sad affect but is pleasant. Says he is down about being here in a new place. Pt says he needs something for sleep. Medication has already been ordered. Pt says he has asthma and uses albuterol as rescue inhaler if needed. Pt also says he takes Symbicort for COPD. Pt BP and breathing are both normal at this time. No other concerns noted at this time.  A: Pt provided support and encouragement. Pt given scheduled medication as prescribed. PRNs as appropriate. Q15 min checks for safety.   R: Pt safe on the unit. Will continue to monitor.

## 2022-08-11 NOTE — ED Notes (Signed)
Patient belongings given to sheriff along with paperwork. Patient ambulatory out of department.

## 2022-08-11 NOTE — ED Notes (Signed)
Pt denies SI at this time. 

## 2022-08-12 DIAGNOSIS — F333 Major depressive disorder, recurrent, severe with psychotic symptoms: Secondary | ICD-10-CM

## 2022-08-12 MED ORDER — QUETIAPINE FUMARATE 25 MG PO TABS
50.0000 mg | ORAL_TABLET | Freq: Every evening | ORAL | Status: DC | PRN
  Administered 2022-08-12: 50 mg via ORAL
  Filled 2022-08-12: qty 2
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
## 2022-08-12 NOTE — Group Note (Signed)
[facility] LCSW Group Therapy Note   Group Date: 08/12/2022 Start Time: 1330 End Time: 1430   Type of Therapy/Topic:  Group Therapy:  Emotion Regulation  Participation Level:  Active   Mood:  Description of Group:    The purpose of this group is to assist patients in learning to regulate negative emotions and experience positive emotions. Patients will be guided to discuss ways in which they have been vulnerable to their negative emotions. These vulnerabilities will be juxtaposed with experiences of positive emotions or situations, and patients challenged to use positive emotions to combat negative ones. Special emphasis will be placed on coping with negative emotions in conflict situations, and patients will process healthy conflict resolution skills.  Therapeutic Goals: Patient will identify two positive emotions or experiences to reflect on in order to balance out negative emotions:  Patient will label two or more emotions that they find the most difficult to experience:  Patient will be able to demonstrate positive conflict resolution skills through discussion or role plays:   Summary of Patient Progress: Patient was present in group.  Patient was an active participant in group.  Patient was supportive of other group.  Patient was appropriate and able to follow along with group discussions.    Therapeutic Modalities:   Cognitive Behavioral Therapy Feelings Identification Dialectical Behavioral Therapy   Rozann Lesches, LCSW

## 2022-08-12 NOTE — Plan of Care (Signed)
D- Patient alert and oriented. Patient in a pleasant mood. Patient states he slept well but is groggy from PRN Trazodone. MD notified. Patient endorses constipation. Patient states he goes every day but has not been able to go in the past two days. Writer administered PRN Milk of Magnesia. Patient denies SI, HI, AVH, and pain.   A- Scheduled medications administered to patient, per MD orders. No PRN meds needed. Support and encouragement provided. Routine safety checks conducted every 15 minutes.  Patient informed to notify staff with problems or concerns.  R- No adverse drug reactions noted. Patient contracts for safety at this time. Patient compliant with medications and treatment plan. Patient receptive, calm, and cooperative. Patient interacts well with others on the unit.  Patient remains safe at this time.   Problem: Nutrition: Goal: Adequate nutrition will be maintained Outcome: Progressing   Problem: Coping: Goal: Level of anxiety will decrease Outcome: Progressing   Problem: Pain Managment: Goal: General experience of comfort will improve Outcome: Progressing   Problem: Health Behavior/Discharge Planning: Goal: Compliance with treatment plan for underlying cause of condition will improve Outcome: Progressing   Problem: Elimination: Goal: Will not experience complications related to bowel motility Outcome: Not Progressing

## 2022-08-12 NOTE — BHH Suicide Risk Assessment (Signed)
[place] INPATIENT:  Family/Significant Other Suicide Prevention Education  Suicide Prevention Education:  Contact Attempts: Jacques Earthly, adughter, [phone]?, (name of family member/significant other) has been identified by the patient as the family member/significant other with whom the patient will be residing, and identified as the person(s) who will aid the patient in the event of a mental health crisis.  With written consent from the patient, two attempts would be made to provide suicide prevention education, prior to and/or following the patient's discharge.  We were unsuccessful in providing suicide prevention education as the patient did not have adequate contact information.  Suicide education to be completed with the patient.  A suicide education pamphlet was given to the patient to share with family/significant other.  Date and time of first attempt:CSW contacted the number provided, however, number was not in working order.  Patient did not know the correct number for his daughter.    Rozann Lesches 08/12/2022, 11:33 AM

## 2022-08-12 NOTE — BHH Suicide Risk Assessment (Signed)
[facility] Admission Suicide Risk Assessment   Nursing information obtained from:  Patient Demographic factors:  Age 66 or older Current Mental Status:  NA Loss Factors:  NA Historical Factors:  NA Risk Reduction Factors:  Sense of responsibility to family  Total Time spent with patient: 1 hour Principal Problem: Major depressive disorder, recurrent episode, severe ([place]) Diagnosis:  Principal Problem:   Major depressive disorder, recurrent episode, severe ([place])  Subjective Data: Robert Chan is a 66 y.o. male with history of asthma, hypertension, COPD, glaucoma, NSTEMI, CAD, and cocaine use disorder who presents the emergency department complaining of chest pain and suicidal ideation.  Patient states has been having some chest pressure for the past few days, that he attributed to having mold in his house.  Is also been having some cough and congestion.  States that he has had "mental health problems", with thoughts of suicide.  When asked about a plan he states that he has "a couple bottle pills left that I can take to kill myself".  Reports intermittent homicidal ideation, with no active plan.  Also reports intermittent visual hallucinations in which he thinks he is seeing things that are not really there, but cannot specify further.  No auditory hallucinations.  Continued Clinical Symptoms:  Alcohol Use Disorder Identification Test Final Score (AUDIT): 0 The "Alcohol Use Disorders Identification Test", Guidelines for Use in Primary Care, Second Edition.  World Pharmacologist [facility]). Score between 0-7:  no or low risk or alcohol related problems. Score between 8-15:  moderate risk of alcohol related problems. Score between 16-19:  high risk of alcohol related problems. Score 20 or above:  warrants further diagnostic evaluation for alcohol dependence and treatment.   CLINICAL FACTORS:   Severe Anxiety and/or Agitation Depression:   Impulsivity Alcohol/Substance  Abuse/Dependencies Unstable or Poor Therapeutic Relationship   Musculoskeletal: Strength & Muscle Tone: within normal limits Gait & Station: normal Patient leans: N/A  Psychiatric Specialty Exam:  Presentation  General Appearance:  Appropriate for Environment  Eye Contact: Good  Speech: Clear and Coherent  Speech Volume: Normal  Handedness:No data recorded  Mood and Affect  Mood: Depressed; Hopeless  Affect: Congruent   Thought Process  Thought Processes: Coherent  Descriptions of Associations:Intact  Orientation:Full (Time, Place and Person)  Thought Content:Logical  History of Schizophrenia/Schizoaffective disorder:No  Duration of Psychotic Symptoms:N/A  Hallucinations:No data recorded Ideas of Reference:None  Suicidal Thoughts:No data recorded Homicidal Thoughts:No data recorded  Sensorium  Memory: Immediate Fair; Recent Fair  Judgment: Fair  Insight: Fair   Community education officer  Concentration: Good  Attention Span: Good  Recall: Good  Fund of Knowledge: Good  Language: Good   Psychomotor Activity  Psychomotor Activity:No data recorded  Assets  Assets: Communication Skills; Desire for Improvement; Leisure Time; Physical Health; Resilience; Social Support   Sleep  Sleep:No data recorded    Blood pressure 136/85, pulse 77, temperature 98.6 F (37 C), resp. rate 16, height '6\' 4"'$  (1.93 m), weight 118.4 kg, SpO2 100 %. Body mass index is 31.77 kg/m.   COGNITIVE FEATURES THAT CONTRIBUTE TO RISK:  None    SUICIDE RISK:   Minimal: No identifiable suicidal ideation.  Patients presenting with no risk factors but with morbid ruminations; may be classified as minimal risk based on the severity of the depressive symptoms  PLAN OF CARE: See orders  I certify that inpatient services furnished can reasonably be expected to improve the patient's condition.   Parks Ranger, DO 08/12/2022, 9:59 AM
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
## 2022-08-12 NOTE — Progress Notes (Signed)
   08/12/22 1930  Psych Admission Type (Psych Patients Only)  Admission Status Involuntary  Psychosocial Assessment  Patient Complaints Other (Comment) (tiredness from sleep medication last night)  Eye Contact Fair  Facial Expression Other (Comment) (appropriate)  Affect Appropriate to circumstance  Speech Logical/coherent  Interaction Assertive  Motor Activity Other (Comment) (wnl)  Appearance/Hygiene Unremarkable  Behavior Characteristics Cooperative;Appropriate to situation  Mood Pleasant  Thought Process  Coherency WDL  Content WDL  Delusions None reported or observed  Perception WDL  Hallucination None reported or observed  Judgment WDL  Confusion None  Danger to Self  Current suicidal ideation? Denies  Agreement Not to Harm Self Yes  Description of Agreement verbal  Danger to Others  Danger to Others None reported or observed   Progress note   D: Pt seen in milieu in dayroom. Pt denies SI, HI, AVH. Pt rates pain  0/10. Pt rates anxiety  0/10 and depression  0/10. Pt contracts for safety. Pt states that he had a "drunk feeling" this morning after taking PRN medication for sleep last night. "I asked the doctor for something else." Pt also c/o constipation. States he has not had a bowel movement in 3 days and his normal bowel habits are daily. Started before admission to unit. Pt given PRN medication today. Will see how he feels in the morning. Pt said he felt tired and dizzy after morning meds. Denies this now. Discussed possible side effects of new PRN med for sleep. Pt wants to know when he might be discharged from hospital. Encouraged to ask provider. No other concerns noted at this time.  A: Pt provided support and encouragement. Pt given scheduled medication as prescribed. PRNs as appropriate. Q15 min checks for safety.   R: Pt safe on the unit. Will continue to monitor.

## 2022-08-13 DIAGNOSIS — F333 Major depressive disorder, recurrent, severe with psychotic symptoms: Secondary | ICD-10-CM | POA: Diagnosis not present

## 2022-08-13 MED ORDER — SENNOSIDES-DOCUSATE SODIUM 8.6-50 MG PO TABS
2.0000 | ORAL_TABLET | Freq: Every day | ORAL | Status: DC
  Administered 2022-08-13 – 2022-08-17 (×5): 2 via ORAL
  Filled 2022-08-13 (×5): qty 2

## 2022-08-13 MED ORDER — DOXEPIN HCL 50 MG PO CAPS
50.0000 mg | ORAL_CAPSULE | Freq: Every day | ORAL | Status: DC
  Administered 2022-08-13 – 2022-08-17 (×5): 50 mg via ORAL
  Filled 2022-08-13 (×5): qty 1

## 2022-08-13 NOTE — Progress Notes (Signed)
Patient is alert and oriented times 4. Mood and affect appropriate. Patient rates pain as 0/10. He denies SI, HI, and AVH. Patient does endorse feelings of anxiety and depression at this time. Patient states he slept well last night. Evening medicines administered whole by mouth without difficulty. Patient ate snack in day room; appetite was fair. Patient remains on unit with Q15 minute checks in place.   

## 2022-08-13 NOTE — Progress Notes (Signed)
Patient requested his albuterol po prn due to SOB. Patient currently eating dinner. Breathing even and unlabored and in no current distress. Patient did state he would like his symbicort scheduled. MD notified.

## 2022-08-13 NOTE — Plan of Care (Signed)
  Problem: Safety: Goal: Ability to remain free from injury will improve Outcome: Progressing   Problem: Education: Goal: Emotional status will improve Outcome: Progressing   Problem: Activity: Goal: Sleeping patterns will improve Outcome: Progressing

## 2022-08-13 NOTE — Group Note (Signed)
[facility] LCSW Group Therapy Note   Group Date: 08/13/2022 Start Time: 1330 End Time: 1430  Type of Therapy and Topic:  Group Therapy:  Feelings around Relapse and Recovery  Participation Level:  Active   Mood:  Description of Group:    Patients in this group will discuss emotions they experience before and after a relapse. They will process how experiencing these feelings, or avoidance of experiencing them, relates to having a relapse. Facilitator will guide patients to explore emotions they have related to recovery. Patients will be encouraged to process which emotions are more powerful. They will be guided to discuss the emotional reaction significant others in their lives may have to patients' relapse or recovery. Patients will be assisted in exploring ways to respond to the emotions of others without this contributing to a relapse.  Therapeutic Goals: Patient will identify two or more emotions that lead to relapse for them:  Patient will identify two emotions that result when they relapse:  Patient will identify two emotions related to recovery:  Patient will demonstrate ability to communicate their needs through discussion and/or role plays.   Summary of Patient Progress: Patient was present in group and an active participant.  Patient shared how he has struggled with substance use. Patient was able to identify both resiliency factors as well as risk factors to relapse.  Patient was able to connect relapse as a part of recovery.    Therapeutic Modalities:   Cognitive Behavioral Therapy Solution-Focused Therapy Assertiveness Training Relapse Prevention Therapy   Rozann Lesches, LCSW

## 2022-08-13 NOTE — BH IP Treatment Plan (Signed)
Interdisciplinary Treatment and Diagnostic Plan Update  08/13/2022 Time of Session: 9:30AM Robert Chan MRN: [id number]  Principal Diagnosis: Major depressive disorder, recurrent episode, severe ([place])  Secondary Diagnoses: Principal Problem:   Major depressive disorder, recurrent episode, severe ([place])   Current Medications:  Current Facility-Administered Medications  Medication Dose Route Frequency Provider Last Rate Last Admin   acetaminophen (TYLENOL) tablet 650 mg  650 mg Oral Q6H PRN Parks Ranger, DO       albuterol (VENTOLIN HFA) 108 (90 Base) MCG/ACT inhaler 1-2 puff  1-2 puff Inhalation Q6H PRN Caroline Sauger, NP       alum & mag hydroxide-simeth (MAALOX/MYLANTA) 200-200-20 MG/5ML suspension 30 mL  30 mL Oral Q4H PRN Parks Ranger, DO       ARIPiprazole (ABILIFY) tablet 10 mg  10 mg Oral Daily Parks Ranger, DO   10 mg at 08/13/22 0920   atorvastatin (LIPITOR) tablet 80 mg  80 mg Oral Daily Parks Ranger, DO   80 mg at 08/13/22 0920   diltiazem (DILACOR XR) 24 hr capsule 240 mg  240 mg Oral Daily Parks Ranger, DO   240 mg at 08/12/22 1054   latanoprost (XALATAN) 0.005 % ophthalmic solution 1 drop  1 drop Both Eyes QHS Parks Ranger, DO   1 drop at 08/12/22 2101   LORazepam (ATIVAN) tablet 1 mg  1 mg Oral Q4H PRN Parks Ranger, DO       magnesium hydroxide (MILK OF MAGNESIA) suspension 30 mL  30 mL Oral Daily PRN Parks Ranger, DO   30 mL at 08/13/22 0920   OLANZapine (ZYPREXA) tablet 10 mg  10 mg Oral Q6H PRN Parks Ranger, DO       QUEtiapine (SEROQUEL) tablet 50 mg  50 mg Oral QHS PRN Parks Ranger, DO   50 mg at 08/12/22 2101   PTA Medications: Medications Prior to Admission  Medication Sig Dispense Refill Last Dose   albuterol (VENTOLIN HFA) 108 (90 Base) MCG/ACT inhaler Inhale 1-2 puffs into the lungs every 6 (six) hours as needed for wheezing or shortness of  breath. 90 g 2    amLODipine (NORVASC) 10 MG tablet Take 1 tablet (10 mg total) by mouth daily. 90 tablet 2    ARIPiprazole (ABILIFY) 10 MG tablet Take 10 mg by mouth daily.      atorvastatin (LIPITOR) 80 MG tablet Take 1 tablet (80 mg total) by mouth daily. (Patient not taking: Reported on 08/10/2022) 90 tablet 3    azelastine (ASTELIN) 0.1 % nasal spray Place 2 sprays into both nostrils 2 (two) times daily. Use in each nostril as directed (Patient not taking: Reported on 08/10/2022) 30 mL 12    diltiazem (TIAZAC) 240 MG 24 hr capsule Take 1 capsule (240 mg total) by mouth daily. 90 capsule 2    fluticasone (FLONASE) 50 MCG/ACT nasal spray Place 2 sprays into both nostrils daily. (Patient not taking: Reported on 08/10/2022) 16 g 6    latanoprost (XALATAN) 0.005 % ophthalmic solution Place 1 drop into both eyes nightly. 2.5 mL 3    SYMBICORT 160-4.5 MCG/ACT inhaler Inhale 2 puffs into the lungs in the morning and at bedtime. 10.2 g 11    valsartan (DIOVAN) 160 MG tablet Take 1 tablet (160 mg total) by mouth daily. 90 tablet 3     Patient Stressors: Medication change or noncompliance    Patient Strengths: Motivation for treatment/growth  Supportive family/friends   Treatment Modalities: Medication Management,  Group therapy, Case management,  1 to 1 session with clinician, Psychoeducation, Recreational therapy.   Physician Treatment Plan for Primary Diagnosis: Major depressive disorder, recurrent episode, severe ([place]) Long Term Goal(s): Improvement in symptoms so as ready for discharge   Short Term Goals: Ability to identify changes in lifestyle to reduce recurrence of condition will improve Ability to verbalize feelings will improve Ability to disclose and discuss suicidal ideas Ability to demonstrate self-control will improve Ability to identify and develop effective coping behaviors will improve Ability to maintain clinical measurements within normal limits will improve Compliance with  prescribed medications will improve Ability to identify triggers associated with substance abuse/mental health issues will improve  Medication Management: Evaluate patient's response, side effects, and tolerance of medication regimen.  Therapeutic Interventions: 1 to 1 sessions, Unit Group sessions and Medication administration.  Evaluation of Outcomes: Progressing  Physician Treatment Plan for Secondary Diagnosis: Principal Problem:   Major depressive disorder, recurrent episode, severe ([place])  Long Term Goal(s): Improvement in symptoms so as ready for discharge   Short Term Goals: Ability to identify changes in lifestyle to reduce recurrence of condition will improve Ability to verbalize feelings will improve Ability to disclose and discuss suicidal ideas Ability to demonstrate self-control will improve Ability to identify and develop effective coping behaviors will improve Ability to maintain clinical measurements within normal limits will improve Compliance with prescribed medications will improve Ability to identify triggers associated with substance abuse/mental health issues will improve     Medication Management: Evaluate patient's response, side effects, and tolerance of medication regimen.  Therapeutic Interventions: 1 to 1 sessions, Unit Group sessions and Medication administration.  Evaluation of Outcomes: Progressing   RN Treatment Plan for Primary Diagnosis: Major depressive disorder, recurrent episode, severe ([place]) Long Term Goal(s): Knowledge of disease and therapeutic regimen to maintain health will improve  Short Term Goals: Ability to verbalize frustration and anger appropriately will improve, Ability to demonstrate self-control, Ability to participate in decision making will improve, Ability to verbalize feelings will improve, Ability to disclose and discuss suicidal ideas, Ability to identify and develop effective coping behaviors will improve, and Compliance with  prescribed medications will improve  Medication Management: RN will administer medications as ordered by provider, will assess and evaluate patient's response and provide education to patient for prescribed medication. RN will report any adverse and/or side effects to prescribing provider.  Therapeutic Interventions: 1 on 1 counseling sessions, Psychoeducation, Medication administration, Evaluate responses to treatment, Monitor vital signs and CBGs as ordered, Perform/monitor CIWA, COWS, AIMS and Fall Risk screenings as ordered, Perform wound care treatments as ordered.  Evaluation of Outcomes: Progressing   LCSW Treatment Plan for Primary Diagnosis: Major depressive disorder, recurrent episode, severe ([place]) Long Term Goal(s): Safe transition to appropriate next level of care at discharge, Engage patient in therapeutic group addressing interpersonal concerns.  Short Term Goals: Engage patient in aftercare planning with referrals and resources, Increase social support, Increase ability to appropriately verbalize feelings, Increase emotional regulation, Facilitate acceptance of mental health diagnosis and concerns, Facilitate patient progression through stages of change regarding substance use diagnoses and concerns, Identify triggers associated with mental health/substance abuse issues, and Increase skills for wellness and recovery  Therapeutic Interventions: Assess for all discharge needs, 1 to 1 time with Social worker, Explore available resources and support systems, Assess for adequacy in community support network, Educate family and significant other(s) on suicide prevention, Complete Psychosocial Assessment, Interpersonal group therapy.  Evaluation of Outcomes: Progressing   Progress in Treatment: Attending  groups: Yes. Participating in groups: Yes. Taking medication as prescribed: Yes. Toleration medication: Yes. Family/Significant other contact made: Yes, individual(s) contacted:  SPE  completed with the patient.  Patient unsure of collateral contact information.  Patient understands diagnosis: Yes. Discussing patient identified problems/goals with staff: Yes. Medical problems stabilized or resolved: Yes. Denies suicidal/homicidal ideation: Yes. Issues/concerns per patient self-inventory: No. Other: none  New problem(s) identified: No, Describe:  none  New Short Term/Long Term Goal(s): detox, elimination of symptoms of psychosis, medication management for mood stabilization; elimination of SI thoughts; development of comprehensive mental wellness/sobriety plan.   Patient Goals:  "get back on my medication and get outpatient that's probably the most important"  Discharge Plan or Barriers: Patient reports plans to return to his home.  Patient requesting assistance with establishing and aftercare provider.   Reason for Continuation of Hospitalization: Anxiety Depression Medication stabilization Suicidal ideation  Estimated Length of Stay:  1-7 days  Last 3 [place] Suicide Severity Risk Score: Flowsheet Row Admission (Current) from 08/11/2022 in [facility] ED from 08/09/2022 in [facility] from 06/08/2022 in [place] Error: Question 6 not populated High Risk No Risk       Last PHQ 2/9 Scores:    05/11/2022    2:30 PM 03/31/2022    2:15 PM 01/14/2022    5:04 PM  Depression screen PHQ 2/9  Decreased Interest 1 0 1  Down, Depressed, Hopeless 1 0 0  PHQ - 2 Score 2 0 1  Altered sleeping 0    Tired, decreased energy 1    Change in appetite 1    Trouble concentrating 1    Moving slowly or fidgety/restless 0    Suicidal thoughts 0    PHQ-9 Score 5      Scribe for Treatment Team: Rozann Lesches, LCSW 08/13/2022 9:50 AM

## 2022-08-13 NOTE — Progress Notes (Addendum)
Patient A&Ox4. Awake early this morning but still "groggy." Sleeping medication discussed with MD. Patient denies any pain but still reports constipation. Milk of magnesia given po prn and docusate has also been newly scheduled and provided to patient. Last BM on 08/11/2022. Still awaiting for desired effect. Patient denies SI,HI, and A/V/H with no plan intent. Rates depression today a 4 out of 10. Patient med compliant and cooperative on unit.    1843: Medication effective. Patient reported having a bm today and relief of constipation.
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
## 2022-08-14 DIAGNOSIS — F333 Major depressive disorder, recurrent, severe with psychotic symptoms: Secondary | ICD-10-CM | POA: Diagnosis not present

## 2022-08-14 NOTE — Progress Notes (Signed)
Patient is alert and oriented times 4. Mood and affect appropriate. Patient rates pain as 0/10. He denies SI, HI, and AVH. Patient does endorse feelings of depression at this time. Patient states he slept well last night. Evening medicines administered whole by mouth without difficulty. Patient ate snack in day room; appetite was fair. Patient remains on unit with Q15 minute checks in place.
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
## 2022-08-14 NOTE — Progress Notes (Signed)
Patient is alert and oriented x 4.  Reported he slept well. Patient with depressed affect.  Pleasant on interaction.  Patient endorses some depression and anxiety.  Denies SI/HI and AVH.  Denies pain. Compliant with medications.  No adverse effects noted.  15 min checks in place. Patient is safe on the unit.  Patient is present in the milieu.  Appropriate interaction with peers.

## 2022-08-14 NOTE — Plan of Care (Signed)
  Problem: Education: Goal: Mental status will improve Outcome: Progressing   Problem: Coping: Goal: Ability to verbalize frustrations and anger appropriately will improve Outcome: Progressing Goal: Ability to demonstrate self-control will improve Outcome: Progressing   Problem: Safety: Goal: Periods of time without injury will increase Outcome: Progressing

## 2022-08-15 DIAGNOSIS — F333 Major depressive disorder, recurrent, severe with psychotic symptoms: Secondary | ICD-10-CM | POA: Diagnosis not present

## 2022-08-15 NOTE — Plan of Care (Signed)
  Problem: Coping: Goal: Level of anxiety will decrease Outcome: Progressing   Problem: Education: Goal: Emotional status will improve Outcome: Progressing   Problem: Activity: Goal: Interest or engagement in activities will improve Outcome: Progressing   Problem: Coping: Goal: Ability to verbalize frustrations and anger appropriately will improve Outcome: Progressing Goal: Ability to demonstrate self-control will improve Outcome: Progressing

## 2022-08-15 NOTE — Progress Notes (Signed)
Patient alert and oriented x4.  Flat affect.  Patient denies SI/HI, AVH, anxiety and depression.  Patient had no complaints this morning.  Denies pain.  Patient reported he slept well.  Compliant with scheduled medications.   15 min checks in place. Patient is safe on the unit. Isolated to room this morning after breakfast.  Appropriate interaction with peers when in milieu.   Patient did not attend group.

## 2022-08-15 NOTE — Progress Notes (Signed)
Patient did not attend music therapy group.  
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
## 2022-08-16 DIAGNOSIS — F333 Major depressive disorder, recurrent, severe with psychotic symptoms: Secondary | ICD-10-CM | POA: Diagnosis not present

## 2022-08-16 NOTE — Group Note (Signed)
[facility] LCSW Group Therapy Note    Group Date: 08/16/2022 Start Time: 1300 End Time: 1400  Type of Therapy and Topic:  Group Therapy:  Overcoming Obstacles  Participation Level:  [facility] PARTICIPATION LEVEL: Did Not Attend  Mood:  Description of Group:   In this group patients will be encouraged to explore what they see as obstacles to their own wellness and recovery. They will be guided to discuss their thoughts, feelings, and behaviors related to these obstacles. The group will process together ways to cope with barriers, with attention given to specific choices patients can make. Each patient will be challenged to identify changes they are motivated to make in order to overcome their obstacles. This group will be process-oriented, with patients participating in exploration of their own experiences as well as giving and receiving support and challenge from other group members.  Therapeutic Goals: 1. Patient will identify personal and current obstacles as they relate to admission. 2. Patient will identify barriers that currently interfere with their wellness or overcoming obstacles.  3. Patient will identify feelings, thought process and behaviors related to these barriers. 4. Patient will identify two changes they are willing to make to overcome these obstacles:    Summary of Patient Progress   X   Therapeutic Modalities:   Cognitive Behavioral Therapy Solution Focused Therapy Motivational Interviewing Relapse Prevention Therapy   Rozann Lesches, LCSW

## 2022-08-16 NOTE — Progress Notes (Signed)
Patient is alert and oriented times 4. Mood and affect appropriate. Patient rates pain as 0/10. He denies SI, HI, and AVH. Patient does endorse feelings of depression at this time. Patient states he slept well last night. Evening medicines administered whole by mouth without difficulty. Patient ate snack in day room; appetite was fair. Patient remains on unit with Q15 minute checks in place.

## 2022-08-16 NOTE — Progress Notes (Signed)
[facility] MD Progress Note  08/16/2022 6:24 PM Robert Chan  MRN:  [id number] Subjective: Follow-up for this patient with depression.  Patient seen and chart reviewed.  Patient tells me he is feeling much better.  He says he came into the hospital because he was feeling very bad and had a lot of stresses on his mind many deaths in his family and was just feeling like he needed to be back on medicine.  Since being here he says his mood is much improved.  He denies auditory hallucinations and denies suicidal thoughts.  Still a bit anxious. Principal Problem: Major depressive disorder, recurrent episode, severe (HCC) Diagnosis: Principal Problem:   Major depressive disorder, recurrent episode, severe (HCC)  Total Time spent with patient: 30 minutes  Past Psychiatric History: Past history of depression multiple medical problems previous cocaine use  Past Medical History:  Past Medical History:  Diagnosis Date   Acute bilateral low back pain without sciatica 02/27/2021   Asthma    CAD (coronary artery disease)    Cataract    CKD (chronic kidney disease)    Cocaine use disorder, mild, abuse (HCC)    COPD (chronic obstructive pulmonary disease) (HCC)    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   Depression    Elevated serum creatinine 08/28/2019   Homelessness 06/19/2020   Hypertension    NSTEMI (non-ST elevated myocardial infarction) [facility])    Status post incision and drainage 04/22/2021   Suicidal ideation 06/05/2020    Past Surgical History:  Procedure Laterality Date   APPENDECTOMY     EYE SURGERY     LEFT HEART CATH AND CORONARY ANGIOGRAPHY N/A 06/05/2020   Procedure: LEFT HEART CATH AND CORONARY ANGIOGRAPHY;  Surgeon: Nigel Mormon, MD;  Location: [facility];  Service: Cardiovascular;  Laterality: N/A;   PROSTATE SURGERY     Family History:  Family History  Problem Relation Age of Onset   Hypertension Mother    Diabetes Mother    Hypertension Father    Hypertension Sister     Family Psychiatric  History: See previous Social History:  Social History   Substance and Sexual Activity  Alcohol Use Yes   Comment: drinks 2x/wk, 2-3 40 oz drinks, last drink Friday     Social History   Substance and Sexual Activity  Drug Use Yes   Types: Cocaine   Comment: cocaine use once a month, last use Friday     Social History   Socioeconomic History   Marital status: Single    Spouse name: Not on file   Number of children: Not on file   Years of education: Not on file   Highest education level: Not on file  Occupational History   Not on file  Tobacco Use   Smoking status: Former    Types: Cigarettes    Quit date: 12/25/2020    Years since quitting: 1.6   Smokeless tobacco: Never   Tobacco comments:    smokes 3-4 cigarettes/day  Vaping Use   Vaping Use: Never used  Substance and Sexual Activity   Alcohol use: Yes    Comment: drinks 2x/wk, 2-3 40 oz drinks, last drink Friday   Drug use: Yes    Types: Cocaine    Comment: cocaine use once a month, last use Friday    Sexual activity: Not Currently  Other Topics Concern   Not on file  Social History Narrative   His mother passed at age 55 (when he was 66 years old) and  his father raised him and his siblings   His parents were pastors as a lot of his sisters and other family members are today   There was a total of 13 children in his family Had 55 sisters, one sister deceased , Had 3 brothers, all brothers deceased   He is the youngest of the 71 and was "always in trouble in my younger years"   Family still live in [place], [place], some in [place], [place]   Has a Daughter and son with a total of 10 grandchildren (81 grand daughters local and 13 grandsons)    Values family   Married twice, present wife incarcerated   Social Determinants of Health   Financial Resource Strain: Low Risk  (03/31/2022)   Overall Financial Resource Strain (CARDIA)    Difficulty of Paying Living Expenses: Not hard at all  Food  Insecurity: No Food Insecurity (08/11/2022)   Hunger Vital Sign    Worried About Running Out of Food in the Last Year: Never true    [place] in the Last Year: Never true  Transportation Needs: No Transportation Needs (08/11/2022)   PRAPARE - Hydrologist (Medical): No    Lack of Transportation (Non-Medical): No  Physical Activity: Not on file  Stress: No Stress Concern Present (03/31/2022)   [place]    Feeling of Stress : Only a little  Social Connections: Moderately Integrated (03/31/2022)   Social Connection and Isolation Panel [NHANES]    Frequency of Communication with Friends and Family: Three times a week    Frequency of Social Gatherings with Friends and Family: Three times a week    Attends Religious Services: 1 to 4 times per year    Active Member of Clubs or Organizations: Yes    Attends Archivist Meetings: 1 to 4 times per year    Marital Status: Never married   Additional Social History:                         Sleep: Fair  Appetite:  Fair  Current Medications: Current Facility-Administered Medications  Medication Dose Route Frequency Provider Last Rate Last Admin   acetaminophen (TYLENOL) tablet 650 mg  650 mg Oral Q6H PRN Parks Ranger, DO       albuterol (VENTOLIN HFA) 108 (90 Base) MCG/ACT inhaler 1-2 puff  1-2 puff Inhalation Q6H PRN Caroline Sauger, NP   2 puff at 08/15/22 2103   alum & mag hydroxide-simeth (MAALOX/MYLANTA) 200-200-20 MG/5ML suspension 30 mL  30 mL Oral Q4H PRN Parks Ranger, DO       ARIPiprazole (ABILIFY) tablet 10 mg  10 mg Oral Daily Parks Ranger, DO   10 mg at 08/16/22 1104   atorvastatin (LIPITOR) tablet 80 mg  80 mg Oral Daily Parks Ranger, DO   80 mg at 08/16/22 1104   diltiazem (CARDIZEM CD) 24 hr capsule 240 mg  240 mg Oral Daily Parks Ranger, DO   240 mg at  08/16/22 1104   doxepin (SINEQUAN) capsule 50 mg  50 mg Oral QHS Parks Ranger, DO   50 mg at 08/15/22 2101   latanoprost (XALATAN) 0.005 % ophthalmic solution 1 drop  1 drop Both Eyes QHS Parks Ranger, DO   1 drop at 08/15/22 2102   LORazepam (ATIVAN) tablet 1 mg  1 mg Oral Q4H PRN Louis Meckel,  Richard Percell Miller, DO       magnesium hydroxide (MILK OF MAGNESIA) suspension 30 mL  30 mL Oral Daily PRN Parks Ranger, DO   30 mL at 08/13/22 0920   OLANZapine (ZYPREXA) tablet 10 mg  10 mg Oral Q6H PRN Parks Ranger, DO       senna-docusate (Senokot-S) tablet 2 tablet  2 tablet Oral Q2000 Parks Ranger, DO   2 tablet at 08/15/22 2101    Lab Results: No results found for this or any previous visit (from the past 48 hour(s)).  Blood Alcohol level:  Lab Results  Component Value Date   ETH <10 08/09/2022   ETH <10 43/32/9518    Metabolic Disorder Labs: Lab Results  Component Value Date   HGBA1C 4.8 09/07/2020   MPG 91.06 09/07/2020   MPG 99.67 09/25/2019   No results found for: "PROLACTIN" Lab Results  Component Value Date   CHOL 173 05/17/2022   TRIG 89 05/17/2022   HDL 53 05/17/2022   CHOLHDL 3.3 05/17/2022   VLDL 15 09/09/2020   LDLCALC 104 (H) 05/17/2022   LDLCALC 80 09/28/2021    Physical Findings: AIMS:  , ,  ,  ,    CIWA:    COWS:     Musculoskeletal: Strength & Muscle Tone: within normal limits Gait & Station: normal Patient leans: N/A  Psychiatric Specialty Exam:  Presentation  General Appearance:  Appropriate for Environment  Eye Contact: Good  Speech: Clear and Coherent  Speech Volume: Normal  Handedness:No data recorded  Mood and Affect  Mood: Depressed; Hopeless  Affect: Congruent   Thought Process  Thought Processes: Coherent  Descriptions of Associations:Intact  Orientation:Full (Time, Place and Person)  Thought Content:Logical  History of Schizophrenia/Schizoaffective  disorder:No  Duration of Psychotic Symptoms:N/A  Hallucinations:No data recorded Ideas of Reference:None  Suicidal Thoughts:No data recorded Homicidal Thoughts:No data recorded  Sensorium  Memory: Immediate Fair; Recent Fair  Judgment: Fair  Insight: Fair   Community education officer  Concentration: Good  Attention Span: Good  Recall: Good  Fund of Knowledge: Good  Language: Good   Psychomotor Activity  Psychomotor Activity:No data recorded  Assets  Assets: Communication Skills; Desire for Improvement; Leisure Time; Physical Health; Resilience; Social Support   Sleep  Sleep:No data recorded   Physical Exam: Physical Exam Vitals and nursing note reviewed.  Constitutional:      Appearance: Normal appearance.  HENT:     Head: Normocephalic and atraumatic.     Mouth/Throat:     Pharynx: Oropharynx is clear.  Eyes:     Pupils: Pupils are equal, round, and reactive to light.  Cardiovascular:     Rate and Rhythm: Normal rate and regular rhythm.  Pulmonary:     Effort: Pulmonary effort is normal.     Breath sounds: Normal breath sounds.  Abdominal:     General: Abdomen is flat.     Palpations: Abdomen is soft.  Musculoskeletal:        General: Normal range of motion.  Skin:    General: Skin is warm and dry.  Neurological:     General: No focal deficit present.     Mental Status: He is alert. Mental status is at baseline.  Psychiatric:        Mood and Affect: Mood normal.        Thought Content: Thought content normal.    Review of Systems  Constitutional: Negative.   HENT: Negative.    Eyes: Negative.   Respiratory: Negative.  Cardiovascular: Negative.   Gastrointestinal: Negative.   Musculoskeletal: Negative.   Skin: Negative.   Neurological: Negative.   Psychiatric/Behavioral: Negative.     Blood pressure 133/83, pulse 81, temperature 97.9 F (36.6 C), resp. rate 18, height '6\' 4"'$  (1.93 m), weight 118.4 kg, SpO2 99 %. Body mass index  is 31.77 kg/m.   Treatment Plan Summary: Plan no change to current treatment plan.  Encourage patient to keep thinking positively.  No change to medicine.  He and I agreed that we may be able to plan for discharge by Wednesday  Alethia Berthold, MD 08/16/2022, 6:24 PM

## 2022-08-16 NOTE — Progress Notes (Signed)
Patient is A+O x 4. He denies SI/HI/AVH. He denies anxiety and depression. Pain 0/10. Mood appropriate. Appetite good. Patient will interact with peers and staff when a good tv show is on for example but mainly likes to isolate in his room. Patient agrees to contract for safety. Q15 minute unit checks in place.

## 2022-08-16 NOTE — Plan of Care (Signed)
  Problem: Education: Goal: Knowledge of General Education information will improve Description: Including pain rating scale, medication(s)/side effects and non-pharmacologic comfort measures Outcome: Progressing   Problem: Health Behavior/Discharge Planning: Goal: Ability to manage health-related needs will improve Outcome: Progressing   Problem: Nutrition: Goal: Adequate nutrition will be maintained Outcome: Progressing   Problem: Elimination: Goal: Will not experience complications related to bowel motility Outcome: Progressing Goal: Will not experience complications related to urinary retention Outcome: Progressing   

## 2022-08-16 NOTE — BH Assessment (Signed)
1900 Received patient sitting in the day room watching TV. He is alert and oriented x 4 having positive interactions with peers. Will continue to observe patient.  2100 Patient is currently denying SI/HI, A/V hallucinations, depression or anxiety.  He is medication compliant. Will continue to monitor patient for safety.  0000 Patient continue to rest quietly in bed.  0600 Patient has remained asleep in bed since he initially went to bed. Will continue to monitor patient for safety.

## 2022-08-17 DIAGNOSIS — F333 Major depressive disorder, recurrent, severe with psychotic symptoms: Secondary | ICD-10-CM | POA: Diagnosis not present

## 2022-08-17 MED ORDER — ATORVASTATIN CALCIUM 80 MG PO TABS: 80.0000 mg | ORAL_TABLET | Freq: Every day | ORAL | 1 refills | Status: DC

## 2022-08-17 MED ORDER — DOXEPIN HCL 50 MG PO CAPS: 50.0000 mg | ORAL_CAPSULE | Freq: Every day | ORAL | 1 refills | Status: DC

## 2022-08-17 MED ORDER — ARIPIPRAZOLE 10 MG PO TABS: 10.0000 mg | ORAL_TABLET | Freq: Every day | ORAL | 1 refills | Status: DC

## 2022-08-17 MED ORDER — SENNOSIDES-DOCUSATE SODIUM 8.6-50 MG PO TABS: 2.0000 | ORAL_TABLET | Freq: Every day | ORAL | 1 refills | Status: DC

## 2022-08-17 MED ORDER — DILTIAZEM HCL ER COATED BEADS 240 MG PO CP24: 240.0000 mg | ORAL_CAPSULE | Freq: Every day | ORAL | 1 refills | Status: DC

## 2022-08-17 MED ORDER — ALBUTEROL SULFATE HFA 108 (90 BASE) MCG/ACT IN AERS: 1.0000 | INHALATION_SPRAY | Freq: Four times a day (QID) | RESPIRATORY_TRACT | 2 refills | Status: DC | PRN

## 2022-08-17 MED ORDER — LATANOPROST 0.005 % OP SOLN: 1.0000 [drp] | Freq: Every day | OPHTHALMIC | 1 refills | Status: DC

## 2022-08-17 NOTE — Progress Notes (Signed)
[facility] MD Progress Note  08/17/2022 5:10 PM Robert Chan  MRN:  [id number] Subjective: Patient seen and chart reviewed.  Patient says he continues to feel better.  Depression is better.  No current voices.  No suicidal ideation.  Tolerating medicine well. Principal Problem: Major depressive disorder, recurrent episode, severe (HCC) Diagnosis: Principal Problem:   Major depressive disorder, recurrent episode, severe ([place])  Total Time spent with patient: 30 minutes  Past Psychiatric History: Past history of depression  Past Medical History:  Past Medical History:  Diagnosis Date   Acute bilateral low back pain without sciatica 02/27/2021   Asthma    CAD (coronary artery disease)    Cataract    CKD (chronic kidney disease)    Cocaine use disorder, mild, abuse (HCC)    COPD (chronic obstructive pulmonary disease) (HCC)    Coronary vasospasm ([place]) 10/07/2020   Depression    Elevated serum creatinine 08/28/2019   Homelessness 06/19/2020   Hypertension    NSTEMI (non-ST elevated myocardial infarction) [facility])    Status post incision and drainage 04/22/2021   Suicidal ideation 06/05/2020    Past Surgical History:  Procedure Laterality Date   APPENDECTOMY     EYE SURGERY     LEFT HEART CATH AND CORONARY ANGIOGRAPHY N/A 06/05/2020   Procedure: LEFT HEART CATH AND CORONARY ANGIOGRAPHY;  Surgeon: Nigel Mormon, MD;  Location: [facility];  Service: Cardiovascular;  Laterality: N/A;   PROSTATE SURGERY     Family History:  Family History  Problem Relation Age of Onset   Hypertension Mother    Diabetes Mother    Hypertension Father    Hypertension Sister    Family Psychiatric  History: See previous Social History:  Social History   Substance and Sexual Activity  Alcohol Use Yes   Comment: drinks 2x/wk, 2-3 40 oz drinks, last drink Friday     Social History   Substance and Sexual Activity  Drug Use Yes   Types: Cocaine   Comment: cocaine use once a month, last use  Friday     Social History   Socioeconomic History   Marital status: Single    Spouse name: Not on file   Number of children: Not on file   Years of education: Not on file   Highest education level: Not on file  Occupational History   Not on file  Tobacco Use   Smoking status: Former    Types: Cigarettes    Quit date: 12/25/2020    Years since quitting: 1.6   Smokeless tobacco: Never   Tobacco comments:    smokes 3-4 cigarettes/day  Vaping Use   Vaping Use: Never used  Substance and Sexual Activity   Alcohol use: Yes    Comment: drinks 2x/wk, 2-3 40 oz drinks, last drink Friday   Drug use: Yes    Types: Cocaine    Comment: cocaine use once a month, last use Friday    Sexual activity: Not Currently  Other Topics Concern   Not on file  Social History Narrative   His mother passed at age 40 (when he was 30 years old) and his father raised him and his siblings   His parents were pastors as a lot of his sisters and other family members are today   There was a total of 13 children in his family Had 60 sisters, one sister deceased , Had 3 brothers, all brothers deceased   He is the youngest of the 43 and was "always in  trouble in my younger years"   Family still live in [place], [place], some in [place], [place]   Has a Daughter and son with a total of 10 grandchildren (99 grand daughters local and 9 grandsons)    Values family   Married twice, present wife incarcerated   Social Determinants of Health   Financial Resource Strain: Low Risk  (03/31/2022)   Overall Financial Resource Strain (CARDIA)    Difficulty of Paying Living Expenses: Not hard at all  Food Insecurity: No Food Insecurity (08/11/2022)   Hunger Vital Sign    Worried About Running Out of Food in the Last Year: Never true    [place] in the Last Year: Never true  Transportation Needs: No Transportation Needs (08/11/2022)   PRAPARE - Hydrologist (Medical): No    Lack of  Transportation (Non-Medical): No  Physical Activity: Not on file  Stress: No Stress Concern Present (03/31/2022)   [place]    Feeling of Stress : Only a little  Social Connections: Moderately Integrated (03/31/2022)   Social Connection and Isolation Panel [NHANES]    Frequency of Communication with Friends and Family: Three times a week    Frequency of Social Gatherings with Friends and Family: Three times a week    Attends Religious Services: 1 to 4 times per year    Active Member of Clubs or Organizations: Yes    Attends Archivist Meetings: 1 to 4 times per year    Marital Status: Never married   Additional Social History:                         Sleep: Fair  Appetite:  Fair  Current Medications: Current Facility-Administered Medications  Medication Dose Route Frequency Provider Last Rate Last Admin   acetaminophen (TYLENOL) tablet 650 mg  650 mg Oral Q6H PRN Parks Ranger, DO       albuterol (VENTOLIN HFA) 108 (90 Base) MCG/ACT inhaler 1-2 puff  1-2 puff Inhalation Q6H PRN Caroline Sauger, NP   2 puff at 08/16/22 2224   alum & mag hydroxide-simeth (MAALOX/MYLANTA) 200-200-20 MG/5ML suspension 30 mL  30 mL Oral Q4H PRN Parks Ranger, DO       ARIPiprazole (ABILIFY) tablet 10 mg  10 mg Oral Daily Parks Ranger, DO   10 mg at 08/17/22 1007   atorvastatin (LIPITOR) tablet 80 mg  80 mg Oral Daily Parks Ranger, DO   80 mg at 08/17/22 1007   diltiazem (CARDIZEM CD) 24 hr capsule 240 mg  240 mg Oral Daily Parks Ranger, DO   240 mg at 08/17/22 1007   doxepin (SINEQUAN) capsule 50 mg  50 mg Oral QHS Parks Ranger, DO   50 mg at 08/16/22 2222   latanoprost (XALATAN) 0.005 % ophthalmic solution 1 drop  1 drop Both Eyes QHS Parks Ranger, DO   1 drop at 08/16/22 2225   LORazepam (ATIVAN) tablet 1 mg  1 mg Oral Q4H PRN Parks Ranger, DO       magnesium hydroxide (MILK OF MAGNESIA) suspension 30 mL  30 mL Oral Daily PRN Parks Ranger, DO   30 mL at 08/13/22 0920   OLANZapine (ZYPREXA) tablet 10 mg  10 mg Oral Q6H PRN Parks Ranger, DO       senna-docusate (Senokot-S) tablet 2 tablet  2 tablet Oral Q2000 Parks Ranger, DO   2 tablet at 08/16/22 2050    Lab Results: No results found for this or any previous visit (from the past 48 hour(s)).  Blood Alcohol level:  Lab Results  Component Value Date   ETH <10 08/09/2022   ETH <10 40/98/1191    Metabolic Disorder Labs: Lab Results  Component Value Date   HGBA1C 4.8 09/07/2020   MPG 91.06 09/07/2020   MPG 99.67 09/25/2019   No results found for: "PROLACTIN" Lab Results  Component Value Date   CHOL 173 05/17/2022   TRIG 89 05/17/2022   HDL 53 05/17/2022   CHOLHDL 3.3 05/17/2022   VLDL 15 09/09/2020   LDLCALC 104 (H) 05/17/2022   LDLCALC 80 09/28/2021    Physical Findings: AIMS:  , ,  ,  ,    CIWA:    COWS:     Musculoskeletal: Strength & Muscle Tone: within normal limits Gait & Station: normal Patient leans: N/A  Psychiatric Specialty Exam:  Presentation  General Appearance:  Appropriate for Environment  Eye Contact: Good  Speech: Clear and Coherent  Speech Volume: Normal  Handedness:No data recorded  Mood and Affect  Mood: Depressed; Hopeless  Affect: Congruent   Thought Process  Thought Processes: Coherent  Descriptions of Associations:Intact  Orientation:Full (Time, Place and Person)  Thought Content:Logical  History of Schizophrenia/Schizoaffective disorder:No  Duration of Psychotic Symptoms:N/A  Hallucinations:No data recorded Ideas of Reference:None  Suicidal Thoughts:No data recorded Homicidal Thoughts:No data recorded  Sensorium  Memory: Immediate Fair; Recent Fair  Judgment: Fair  Insight: Fair   Community education officer  Concentration: Good  Attention  Span: Good  Recall: Good  Fund of Knowledge: Good  Language: Good   Psychomotor Activity  Psychomotor Activity:No data recorded  Assets  Assets: Communication Skills; Desire for Improvement; Leisure Time; Physical Health; Resilience; Social Support   Sleep  Sleep:No data recorded   Physical Exam: Physical Exam Vitals and nursing note reviewed.  Constitutional:      Appearance: Normal appearance.  HENT:     Head: Normocephalic and atraumatic.     Mouth/Throat:     Pharynx: Oropharynx is clear.  Eyes:     Pupils: Pupils are equal, round, and reactive to light.  Cardiovascular:     Rate and Rhythm: Normal rate and regular rhythm.  Pulmonary:     Effort: Pulmonary effort is normal.     Breath sounds: Normal breath sounds.  Abdominal:     General: Abdomen is flat.     Palpations: Abdomen is soft.  Musculoskeletal:        General: Normal range of motion.  Skin:    General: Skin is warm and dry.  Neurological:     General: No focal deficit present.     Mental Status: He is alert. Mental status is at baseline.  Psychiatric:        Attention and Perception: Attention normal.        Mood and Affect: Mood normal.        Speech: Speech normal.        Behavior: Behavior normal.        Thought Content: Thought content normal.        Cognition and Memory: Cognition normal.        Judgment: Judgment normal.    Review of Systems  Constitutional: Negative.   HENT: Negative.    Eyes: Negative.   Respiratory: Negative.    Cardiovascular: Negative.   Gastrointestinal: Negative.  Musculoskeletal: Negative.   Skin: Negative.   Neurological: Negative.   Psychiatric/Behavioral: Negative.     Blood pressure 139/79, pulse 76, temperature 98.5 F (36.9 C), temperature source Oral, resp. rate 18, height '6\' 4"'$  (1.93 m), weight 118.4 kg, SpO2 99 %. Body mass index is 31.77 kg/m.   Treatment Plan Summary: Medication management and Plan continue current medication.   Supportive counseling.  Review plan which is for probable discharge tomorrow  Alethia Berthold, MD 08/17/2022, 5:10 PM

## 2022-08-17 NOTE — Group Note (Signed)
[facility] LCSW Group Therapy Note   Group Date: 08/17/2022 Start Time: 1300 End Time: 1400   Type of Therapy and Topic: Group Therapy: Avoiding Self-Sabotaging and Enabling Behaviors  Participation Level: Active  Description of Group:  In this group, patients will learn how to identify obstacles, self-sabotaging and enabling behaviors, as well as: what are they, why do we do them and what needs these behaviors meet. Discuss unhealthy relationships and how to have positive healthy boundaries with those that sabotage and enable. Explore aspects of self-sabotage and enabling in yourself and how to limit these self-destructive behaviors in everyday life.   Therapeutic Goals: 1. Patient will identify one obstacle that relates to self-sabotage and enabling behaviors 2. Patient will identify one personal self-sabotaging or enabling behavior they did prior to admission 3. Patient will state a plan to change the above identified behavior 4. Patient will demonstrate ability to communicate their needs through discussion and/or role play.    Summary of Patient Progress: Patient was present for the entirety of the group session. Patient was an active listener and participated in the topic of discussion, provided helpful advice to others, and added nuance to topic of conversation.    Therapeutic Modalities:  Cognitive Behavioral Therapy Person-Centered Therapy Motivational Interviewing    Durenda Hurt, [place]

## 2022-08-17 NOTE — BHH Suicide Risk Assessment (Signed)
[facility] Discharge Suicide Risk Assessment   Principal Problem: Major depressive disorder, recurrent episode, severe ([place]) Discharge Diagnoses: Principal Problem:   Major depressive disorder, recurrent episode, severe ([place])   Total Time spent with patient: 30 minutes  Musculoskeletal: Strength & Muscle Tone: within normal limits Gait & Station: normal Patient leans: N/A  Psychiatric Specialty Exam  Presentation  General Appearance:  Appropriate for Environment  Eye Contact: Good  Speech: Clear and Coherent  Speech Volume: Normal  Handedness:No data recorded  Mood and Affect  Mood: Depressed; Hopeless  Duration of Depression Symptoms: Greater than two weeks  Affect: Congruent   Thought Process  Thought Processes: Coherent  Descriptions of Associations:Intact  Orientation:Full (Time, Place and Person)  Thought Content:Logical  History of Schizophrenia/Schizoaffective disorder:No  Duration of Psychotic Symptoms:N/A  Hallucinations:No data recorded Ideas of Reference:None  Suicidal Thoughts:No data recorded Homicidal Thoughts:No data recorded  Sensorium  Memory: Immediate Fair; Recent Fair  Judgment: Fair  Insight: Fair   Community education officer  Concentration: Good  Attention Span: Good  Recall: Good  Fund of Knowledge: Good  Language: Good   Psychomotor Activity  Psychomotor Activity:No data recorded  Assets  Assets: Communication Skills; Desire for Improvement; Leisure Time; Physical Health; Resilience; Social Support   Sleep  Sleep:No data recorded  Physical Exam: Physical Exam Vitals reviewed.  Constitutional:      Appearance: Normal appearance.  HENT:     Head: Normocephalic and atraumatic.     Mouth/Throat:     Pharynx: Oropharynx is clear.  Eyes:     Pupils: Pupils are equal, round, and reactive to light.  Cardiovascular:     Rate and Rhythm: Normal rate and regular rhythm.  Pulmonary:     Effort: Pulmonary  effort is normal.     Breath sounds: Normal breath sounds.  Abdominal:     General: Abdomen is flat.     Palpations: Abdomen is soft.  Musculoskeletal:        General: Normal range of motion.  Skin:    General: Skin is warm and dry.  Neurological:     General: No focal deficit present.     Mental Status: He is alert. Mental status is at baseline.  Psychiatric:        Attention and Perception: Attention normal.        Mood and Affect: Mood normal.        Speech: Speech normal.        Behavior: Behavior normal.        Thought Content: Thought content normal.        Cognition and Memory: Cognition normal.    Review of Systems  Constitutional: Negative.   HENT: Negative.    Eyes: Negative.   Respiratory: Negative.    Cardiovascular: Negative.   Gastrointestinal: Negative.   Musculoskeletal: Negative.   Skin: Negative.   Neurological: Negative.   Psychiatric/Behavioral: Negative.     Blood pressure 139/79, pulse 76, temperature 98.5 F (36.9 C), temperature source Oral, resp. rate 18, height '6\' 4"'$  (1.93 m), weight 118.4 kg, SpO2 99 %. Body mass index is 31.77 kg/m.  Mental Status Per Nursing Assessment::   On Admission:  NA  Demographic Factors:  Male and Age 68 or older  Loss Factors: NA  Historical Factors: Prior suicide attempts  Risk Reduction Factors:   Positive therapeutic relationship  Continued Clinical Symptoms:  Depression:   Severe  Cognitive Features That Contribute To Risk:  None    Suicide Risk:  Minimal: No identifiable suicidal  ideation.  Patients presenting with no risk factors but with morbid ruminations; may be classified as minimal risk based on the severity of the depressive symptoms   Follow-up Information     [place]--Case Manager Follow up.   Why: She would like for you to call her at discharge [phone].        [facility] Follow up.   Why: Appointment is scheduled for 08/24/2022 at 12:30PM.  Please  ARRIVE at 12 noon to complete paperwork. Contact information: [place] [address] [phone]                 Plan Of Care/Follow-up recommendations:  Other:  Patient denies all suicidal ideation.  Behavior has been calm and appropriate and cooperative.  Tolerating medicine well.  Patient is stable and no longer seems to require inpatient treatment.  Plan is for follow-up mental health care after discharge  Alethia Berthold, MD 08/17/2022, 5:12 PM

## 2022-08-17 NOTE — Care Management Important Message (Signed)
Important Message  Patient Details  Name: Robert Chan MRN: [id number] Date of Birth: [birth year]   Medicare Important Message Given:  Yes     Rozann Lesches, LCSW 08/17/2022, 1:31 PM

## 2022-08-17 NOTE — Plan of Care (Signed)
  Problem: Education: Goal: Knowledge of General Education information will improve Description: Including pain rating scale, medication(s)/side effects and non-pharmacologic comfort measures Outcome: Progressing   Problem: Coping: Goal: Level of anxiety will decrease Outcome: Progressing   Problem: Education: Goal: Emotional status will improve Outcome: Progressing   Patient alert, pleasant and cooperative on rounds.  Patient is medication compliant.  Patient denies AVH, SI/HI, anxiety ,depression and pain.  Patient is visible on unit, in dayroom watching TV and interacting with other patients.  Patient was assisted to shave as per request.  Patient can contract for safety,.  Q 15 minute rounds in progress, will continue to monitor.

## 2022-08-18 MED ORDER — ATORVASTATIN CALCIUM 80 MG PO TABS: 80.0000 mg | ORAL_TABLET | Freq: Every day | ORAL | 1 refills | Status: DC

## 2022-08-18 MED ORDER — ARIPIPRAZOLE 10 MG PO TABS: 10.0000 mg | ORAL_TABLET | Freq: Every day | ORAL | 1 refills | Status: DC

## 2022-08-18 MED ORDER — ALBUTEROL SULFATE HFA 108 (90 BASE) MCG/ACT IN AERS: 1.0000 | INHALATION_SPRAY | Freq: Four times a day (QID) | RESPIRATORY_TRACT | 2 refills | Status: DC | PRN

## 2022-08-18 MED ORDER — SENNOSIDES-DOCUSATE SODIUM 8.6-50 MG PO TABS: 2.0000 | ORAL_TABLET | Freq: Every day | ORAL | 1 refills | Status: DC

## 2022-08-18 MED ORDER — DILTIAZEM HCL ER COATED BEADS 240 MG PO CP24: 240.0000 mg | ORAL_CAPSULE | Freq: Every day | ORAL | 1 refills | Status: DC

## 2022-08-18 MED ORDER — DOXEPIN HCL 50 MG PO CAPS: 50.0000 mg | ORAL_CAPSULE | Freq: Every day | ORAL | 1 refills | Status: DC

## 2022-08-18 MED ORDER — LATANOPROST 0.005 % OP SOLN: 1.0000 [drp] | Freq: Every day | OPHTHALMIC | 1 refills | Status: DC

## 2022-08-18 NOTE — Group Note (Signed)
[facility] LCSW Group Therapy Note   Group Date: 08/18/2022 Start Time: 4270 End Time: 1415   Type of Therapy/Topic:  Group Therapy:  Emotion Regulation  Participation Level:  Active   Mood:  Description of Group:    The purpose of this group is to assist patients in learning to regulate negative emotions and experience positive emotions. Patients will be guided to discuss ways in which they have been vulnerable to their negative emotions. These vulnerabilities will be juxtaposed with experiences of positive emotions or situations, and patients challenged to use positive emotions to combat negative ones. Special emphasis will be placed on coping with negative emotions in conflict situations, and patients will process healthy conflict resolution skills.  Therapeutic Goals: Patient will identify two positive emotions or experiences to reflect on in order to balance out negative emotions:  Patient will label two or more emotions that they find the most difficult to experience:  Patient will be able to demonstrate positive conflict resolution skills through discussion or role plays:   Summary of Patient Progress: Patient was present in group.  Patient was an active participant and supportive of others.  Patient shared that he has cared for since being in the hospital. Patient shared how he was apprehensive being the only person of color, however, he was happy that things were better.   Therapeutic Modalities:   Cognitive Behavioral Therapy Feelings Identification Dialectical Behavioral Therapy   Rozann Lesches, LCSW

## 2022-08-18 NOTE — Discharge Summary (Signed)
Physician Discharge Summary Note  Patient:  Robert Chan is an 66 y.o., male MRN:  [id number] DOB:  Jul 15, 1956 Patient phone:  [phone] (home)  Patient address:   [address] [place] [address],  Total Time spent with patient: 30 minutes  Date of Admission:  08/11/2022 Date of Discharge: 08/18/2022  Reason for Admission: Patient was admitted after presenting with symptoms of depression with suicidal and homicidal ideation in the context of substance use problems as well  Principal Problem: Major depressive disorder, recurrent episode, severe ([place]) Discharge Diagnoses: Principal Problem:   Major depressive disorder, recurrent episode, severe ([place])   Past Psychiatric History: Patient has a history of depression with a past history of previous suicide attempts chronic dysphoric mood and irritability and a history of substance use problems.  Multiple medical problems as well  Past Medical History:  Past Medical History:  Diagnosis Date   Acute bilateral low back pain without sciatica 02/27/2021   Asthma    CAD (coronary artery disease)    Cataract    CKD (chronic kidney disease)    Cocaine use disorder, mild, abuse (HCC)    COPD (chronic obstructive pulmonary disease) (HCC)    Coronary vasospasm ([place]) 10/07/2020   Depression    Elevated serum creatinine 08/28/2019   Homelessness 06/19/2020   Hypertension    NSTEMI (non-ST elevated myocardial infarction) [facility])    Status post incision and drainage 04/22/2021   Suicidal ideation 06/05/2020    Past Surgical History:  Procedure Laterality Date   APPENDECTOMY     EYE SURGERY     LEFT HEART CATH AND CORONARY ANGIOGRAPHY N/A 06/05/2020   Procedure: LEFT HEART CATH AND CORONARY ANGIOGRAPHY;  Surgeon: Nigel Mormon, MD;  Location: [place] [facility];  Service: Cardiovascular;  Laterality: N/A;   PROSTATE SURGERY     Family History:  Family History  Problem Relation Age of Onset   Hypertension Mother     Diabetes Mother    Hypertension Father    Hypertension Sister    Family Psychiatric  History: See previous Social History:  Social History   Substance and Sexual Activity  Alcohol Use Yes   Comment: drinks 2x/wk, 2-3 40 oz drinks, last drink Friday     Social History   Substance and Sexual Activity  Drug Use Yes   Types: Cocaine   Comment: cocaine use once a month, last use Friday     Social History   Socioeconomic History   Marital status: Single    Spouse name: Not on file   Number of children: Not on file   Years of education: Not on file   Highest education level: Not on file  Occupational History   Not on file  Tobacco Use   Smoking status: Former    Types: Cigarettes    Quit date: 12/25/2020    Years since quitting: 1.6   Smokeless tobacco: Never   Tobacco comments:    smokes 3-4 cigarettes/day  Vaping Use   Vaping Use: Never used  Substance and Sexual Activity   Alcohol use: Yes    Comment: drinks 2x/wk, 2-3 40 oz drinks, last drink Friday   Drug use: Yes    Types: Cocaine    Comment: cocaine use once a month, last use Friday    Sexual activity: Not Currently  Other Topics Concern   Not on file  Social History Narrative   His mother passed at age 65 (when he was 48 years old) and his  father raised him and his siblings   His parents were pastors as a lot of his sisters and other family members are today   There was a total of 13 children in his family Had 66 sisters, one sister deceased , Had 3 brothers, all brothers deceased   He is the youngest of the 25 and was "always in trouble in my younger years"   Family still live in [place], [place], some in [place], [place]   Has a Daughter and son with a total of 10 grandchildren (55 grand daughters local and 7 grandsons)    Values family   Married twice, present wife incarcerated   Social Determinants of Health   Financial Resource Strain: Low Risk  (03/31/2022)   Overall Financial Resource Strain (CARDIA)     Difficulty of Paying Living Expenses: Not hard at all  Food Insecurity: No Food Insecurity (08/11/2022)   Hunger Vital Sign    Worried About Running Out of Food in the Last Year: Never true    [place] in the Last Year: Never true  Transportation Needs: No Transportation Needs (08/11/2022)   PRAPARE - Hydrologist (Medical): No    Lack of Transportation (Non-Medical): No  Physical Activity: Not on file  Stress: No Stress Concern Present (03/31/2022)   [place]    Feeling of Stress : Only a little  Social Connections: Moderately Integrated (03/31/2022)   Social Connection and Isolation Panel [NHANES]    Frequency of Communication with Friends and Family: Three times a week    Frequency of Social Gatherings with Friends and Family: Three times a week    Attends Religious Services: 1 to 4 times per year    Active Member of Clubs or Organizations: Yes    Attends Archivist Meetings: 1 to 4 times per year    Marital Status: Never married    Hospital Course: Patient admitted to psychiatric hospital.  Medications adjusted with an aim towards improving depression symptoms irritability and mood instability and anxiety.  Patient was included in individual and group therapy and had daily reassessment of mood and anxiety symptoms.  By the time of discharge he was consistently denying suicidal ideation.  He appeared to be lucid and calm and with good insight and fully agreed to an outpatient plan and will be given prescriptions as well as referral for outpatient mental health and substance use problems.  Physical Findings: AIMS:  , ,  ,  ,    CIWA:    COWS:     Musculoskeletal: Strength & Muscle Tone: within normal limits Gait & Station: normal Patient leans: N/A   Psychiatric Specialty Exam:  Presentation  General Appearance:  Appropriate for Environment  Eye  Contact: Good  Speech: Clear and Coherent  Speech Volume: Normal  Handedness:No data recorded  Mood and Affect  Mood: Depressed; Hopeless  Affect: Congruent   Thought Process  Thought Processes: Coherent  Descriptions of Associations:Intact  Orientation:Full (Time, Place and Person)  Thought Content:Logical  History of Schizophrenia/Schizoaffective disorder:No  Duration of Psychotic Symptoms:N/A  Hallucinations:No data recorded Ideas of Reference:None  Suicidal Thoughts:No data recorded Homicidal Thoughts:No data recorded  Sensorium  Memory: Immediate Fair; Recent Fair  Judgment: Fair  Insight: Fair   Community education officer  Concentration: Good  Attention Span: Good  Recall: Good  Fund of Knowledge: Good  Language: Good   Psychomotor Activity  Psychomotor Activity:No data  recorded  Assets  Assets: Communication Skills; Desire for Improvement; Leisure Time; Physical Health; Resilience; Social Support   Sleep  Sleep:No data recorded   Physical Exam: Physical Exam Vitals and nursing note reviewed.  Constitutional:      Appearance: Normal appearance.  HENT:     Head: Normocephalic and atraumatic.     Mouth/Throat:     Pharynx: Oropharynx is clear.  Eyes:     Pupils: Pupils are equal, round, and reactive to light.  Cardiovascular:     Rate and Rhythm: Normal rate and regular rhythm.  Pulmonary:     Effort: Pulmonary effort is normal.     Breath sounds: Normal breath sounds.  Abdominal:     General: Abdomen is flat.     Palpations: Abdomen is soft.  Musculoskeletal:        General: Normal range of motion.  Skin:    General: Skin is warm and dry.  Neurological:     General: No focal deficit present.     Mental Status: He is alert. Mental status is at baseline.  Psychiatric:        Attention and Perception: Attention normal.        Mood and Affect: Mood normal.        Speech: Speech normal.        Behavior: Behavior  normal.        Thought Content: Thought content normal.        Cognition and Memory: Cognition normal.        Judgment: Judgment normal.    Review of Systems  Constitutional: Negative.   HENT: Negative.    Eyes: Negative.   Respiratory: Negative.    Cardiovascular: Negative.   Gastrointestinal: Negative.   Musculoskeletal: Negative.   Skin: Negative.   Neurological: Negative.   Psychiatric/Behavioral: Negative.     Blood pressure 135/89, pulse 74, temperature 97.8 F (36.6 C), resp. rate 18, height '6\' 4"'$  (1.93 m), weight 118.4 kg, SpO2 99 %. Body mass index is 31.77 kg/m.   Social History   Tobacco Use  Smoking Status Former   Types: Cigarettes   Quit date: 12/25/2020   Years since quitting: 1.6  Smokeless Tobacco Never  Tobacco Comments   smokes 3-4 cigarettes/day   Tobacco Cessation:  N/A, patient does not currently use tobacco products   Blood Alcohol level:  Lab Results  Component Value Date   ETH <10 08/09/2022   ETH <10 30/16/0109    Metabolic Disorder Labs:  Lab Results  Component Value Date   HGBA1C 4.8 09/07/2020   MPG 91.06 09/07/2020   MPG 99.67 09/25/2019   No results found for: "PROLACTIN" Lab Results  Component Value Date   CHOL 173 05/17/2022   TRIG 89 05/17/2022   HDL 53 05/17/2022   CHOLHDL 3.3 05/17/2022   VLDL 15 09/09/2020   LDLCALC 104 (H) 05/17/2022   [place] 80 09/28/2021    See Psychiatric Specialty Exam and Suicide Risk Assessment completed by Attending Physician prior to discharge.  Discharge destination:  Home  Is patient on multiple antipsychotic therapies at discharge:  No   Has Patient had three or more failed trials of antipsychotic monotherapy by history:  No  Recommended Plan for Multiple Antipsychotic Therapies: NA  Discharge Instructions     Diet - low sodium heart healthy   Complete by: As directed    Increase activity slowly   Complete by: As directed       Allergies as of 08/18/2022  Reactions   Shellfish Allergy Hives, Rash   Shellfish-derived Products Rash   Other reaction(s): Other (See Comments)        Medication List     STOP taking these medications    amLODipine 10 MG tablet Commonly known as: NORVASC   azelastine 0.1 % nasal spray Commonly known as: ASTELIN   diltiazem 240 MG 24 hr capsule Commonly known as: TIAZAC Replaced by: diltiazem 240 MG 24 hr capsule   fluticasone 50 MCG/ACT nasal spray Commonly known as: FLONASE   Symbicort 160-4.5 MCG/ACT inhaler Generic drug: budesonide-formoterol   valsartan 160 MG tablet Commonly known as: DIOVAN       TAKE these medications      Indication  albuterol 108 (90 Base) MCG/ACT inhaler Commonly known as: VENTOLIN HFA Inhale 1-2 puffs into the lungs every 6 (six) hours as needed for wheezing or shortness of breath.  Indication: Spasm of Lung Air Passages   ARIPiprazole 10 MG tablet Commonly known as: ABILIFY Take 1 tablet (10 mg total) by mouth daily.  Indication: Major Depressive Disorder   atorvastatin 80 MG tablet Commonly known as: LIPITOR Take 1 tablet (80 mg total) by mouth daily.  Indication: High Amount of Fats in the Blood   diltiazem 240 MG 24 hr capsule Commonly known as: CARDIZEM CD Take 1 capsule (240 mg total) by mouth daily. Replaces: diltiazem 240 MG 24 hr capsule  Indication: Atrial Fibrillation   doxepin 50 MG capsule Commonly known as: SINEQUAN Take 1 capsule (50 mg total) by mouth at bedtime.  Indication: Insomnia   latanoprost 0.005 % ophthalmic solution Commonly known as: XALATAN Place 1 drop into both eyes at bedtime. What changed:  how much to take how to take this when to take this additional instructions  Indication: Wide-Angle Glaucoma   senna-docusate 8.6-50 MG tablet Commonly known as: Senokot-S Take 2 tablets by mouth daily at 8 pm.  Indication: Constipation        Follow-up Information     [facility]--Case Manager Follow up.   Why: She  would like for you to call her at discharge [phone].        [facility] Follow up.   Why: Appointment is scheduled for 08/24/2022 at 12:30PM.  Please ARRIVE at 12 noon to complete paperwork. Contact information: [place] [address] [phone]                 Follow-up recommendations:  Other:  Follow-up will be made with outpatient providers in [place].  Continue follow-up with case management and mental health providers.  Do not relapse into cocaine use.  Comments: Prescriptions provided and have been sent electronically to his preferred pharmacy which is the [facility] in [place]  Signed: Alethia Berthold, MD 08/18/2022, 10:21 AM

## 2022-08-18 NOTE — Progress Notes (Signed)
Pt was educated on prescriptions, follow up care, when to call for help/suicide hotline. Pt questions were answered and pt verbalized understanding and did not voice any concerns. Pt's belongings were returned and. Pt was safely discharged to the [facility]. Patient denies SI, HI, and AVH and is not observed to be in any distress at time of discharge.

## 2022-08-18 NOTE — BH IP Treatment Plan (Signed)
Interdisciplinary Treatment and Diagnostic Plan Update  08/18/2022 Time of Session: 8:30AM Robert Chan MRN: [id number]  Principal Diagnosis: Major depressive disorder, recurrent episode, severe ([place])  Secondary Diagnoses: Principal Problem:   Major depressive disorder, recurrent episode, severe ([place])   Current Medications:  Current Facility-Administered Medications  Medication Dose Route Frequency Provider Last Rate Last Admin   acetaminophen (TYLENOL) tablet 650 mg  650 mg Oral Q6H PRN Parks Ranger, DO       albuterol (VENTOLIN HFA) 108 (90 Base) MCG/ACT inhaler 1-2 puff  1-2 puff Inhalation Q6H PRN Caroline Sauger, NP   2 puff at 08/16/22 2224   alum & mag hydroxide-simeth (MAALOX/MYLANTA) 200-200-20 MG/5ML suspension 30 mL  30 mL Oral Q4H PRN Parks Ranger, DO       ARIPiprazole (ABILIFY) tablet 10 mg  10 mg Oral Daily Parks Ranger, DO   10 mg at 08/18/22 0811   atorvastatin (LIPITOR) tablet 80 mg  80 mg Oral Daily Parks Ranger, DO   80 mg at 08/18/22 2202   diltiazem (CARDIZEM CD) 24 hr capsule 240 mg  240 mg Oral Daily Parks Ranger, DO   240 mg at 08/18/22 0811   doxepin (SINEQUAN) capsule 50 mg  50 mg Oral QHS Parks Ranger, DO   50 mg at 08/17/22 2201   latanoprost (XALATAN) 0.005 % ophthalmic solution 1 drop  1 drop Both Eyes QHS Parks Ranger, DO   1 drop at 08/17/22 2200   LORazepam (ATIVAN) tablet 1 mg  1 mg Oral Q4H PRN Parks Ranger, DO       magnesium hydroxide (MILK OF MAGNESIA) suspension 30 mL  30 mL Oral Daily PRN Parks Ranger, DO   30 mL at 08/13/22 0920   OLANZapine (ZYPREXA) tablet 10 mg  10 mg Oral Q6H PRN Parks Ranger, DO       senna-docusate (Senokot-S) tablet 2 tablet  2 tablet Oral Q2000 Parks Ranger, DO   2 tablet at 08/17/22 2051   PTA Medications: Medications Prior to Admission  Medication Sig Dispense Refill Last Dose   amLODipine  (NORVASC) 10 MG tablet Take 1 tablet (10 mg total) by mouth daily. 90 tablet 2    azelastine (ASTELIN) 0.1 % nasal spray Place 2 sprays into both nostrils 2 (two) times daily. Use in each nostril as directed (Patient not taking: Reported on 08/10/2022) 30 mL 12    diltiazem (TIAZAC) 240 MG 24 hr capsule Take 1 capsule (240 mg total) by mouth daily. 90 capsule 2    fluticasone (FLONASE) 50 MCG/ACT nasal spray Place 2 sprays into both nostrils daily. (Patient not taking: Reported on 08/10/2022) 16 g 6    latanoprost (XALATAN) 0.005 % ophthalmic solution Place 1 drop into both eyes nightly. 2.5 mL 3    SYMBICORT 160-4.5 MCG/ACT inhaler Inhale 2 puffs into the lungs in the morning and at bedtime. 10.2 g 11    valsartan (DIOVAN) 160 MG tablet Take 1 tablet (160 mg total) by mouth daily. 90 tablet 3    [DISCONTINUED] albuterol (VENTOLIN HFA) 108 (90 Base) MCG/ACT inhaler Inhale 1-2 puffs into the lungs every 6 (six) hours as needed for wheezing or shortness of breath. 90 g 2    [DISCONTINUED] ARIPiprazole (ABILIFY) 10 MG tablet Take 10 mg by mouth daily.      [DISCONTINUED] atorvastatin (LIPITOR) 80 MG tablet Take 1 tablet (80 mg total) by mouth daily. (Patient not taking: Reported on 08/10/2022) 90 tablet  3     Patient Stressors: Medication change or noncompliance    Patient Strengths: Motivation for treatment/growth  Supportive family/friends   Treatment Modalities: Medication Management, Group therapy, Case management,  1 to 1 session with clinician, Psychoeducation, Recreational therapy.   Physician Treatment Plan for Primary Diagnosis: Major depressive disorder, recurrent episode, severe ([place]) Long Term Goal(s): Improvement in symptoms so as ready for discharge   Short Term Goals: Ability to identify changes in lifestyle to reduce recurrence of condition will improve Ability to verbalize feelings will improve Ability to disclose and discuss suicidal ideas Ability to demonstrate self-control  will improve Ability to identify and develop effective coping behaviors will improve Ability to maintain clinical measurements within normal limits will improve Compliance with prescribed medications will improve Ability to identify triggers associated with substance abuse/mental health issues will improve  Medication Management: Evaluate patient's response, side effects, and tolerance of medication regimen.  Therapeutic Interventions: 1 to 1 sessions, Unit Group sessions and Medication administration.  Evaluation of Outcomes: Progressing  Physician Treatment Plan for Secondary Diagnosis: Principal Problem:   Major depressive disorder, recurrent episode, severe ([place])  Long Term Goal(s): Improvement in symptoms so as ready for discharge   Short Term Goals: Ability to identify changes in lifestyle to reduce recurrence of condition will improve Ability to verbalize feelings will improve Ability to disclose and discuss suicidal ideas Ability to demonstrate self-control will improve Ability to identify and develop effective coping behaviors will improve Ability to maintain clinical measurements within normal limits will improve Compliance with prescribed medications will improve Ability to identify triggers associated with substance abuse/mental health issues will improve     Medication Management: Evaluate patient's response, side effects, and tolerance of medication regimen.  Therapeutic Interventions: 1 to 1 sessions, Unit Group sessions and Medication administration.  Evaluation of Outcomes: Progressing   RN Treatment Plan for Primary Diagnosis: Major depressive disorder, recurrent episode, severe ([place]) Long Term Goal(s): Knowledge of disease and therapeutic regimen to maintain health will improve  Short Term Goals: Ability to demonstrate self-control, Ability to participate in decision making will improve, Ability to verbalize feelings will improve, Ability to disclose and discuss  suicidal ideas, and Ability to identify and develop effective coping behaviors will improve  Medication Management: RN will administer medications as ordered by provider, will assess and evaluate patient's response and provide education to patient for prescribed medication. RN will report any adverse and/or side effects to prescribing provider.  Therapeutic Interventions: 1 on 1 counseling sessions, Psychoeducation, Medication administration, Evaluate responses to treatment, Monitor vital signs and CBGs as ordered, Perform/monitor CIWA, COWS, AIMS and Fall Risk screenings as ordered, Perform wound care treatments as ordered.  Evaluation of Outcomes: Progressing   LCSW Treatment Plan for Primary Diagnosis: Major depressive disorder, recurrent episode, severe ([place]) Long Term Goal(s): Safe transition to appropriate next level of care at discharge, Engage patient in therapeutic group addressing interpersonal concerns.  Short Term Goals: Engage patient in aftercare planning with referrals and resources, Increase social support, Increase ability to appropriately verbalize feelings, Increase emotional regulation, Facilitate acceptance of mental health diagnosis and concerns, Facilitate patient progression through stages of change regarding substance use diagnoses and concerns, Identify triggers associated with mental health/substance abuse issues, and Increase skills for wellness and recovery  Therapeutic Interventions: Assess for all discharge needs, 1 to 1 time with Social worker, Explore available resources and support systems, Assess for adequacy in community support network, Educate family and significant other(s) on suicide prevention, Complete Psychosocial Assessment, Interpersonal  group therapy.  Evaluation of Outcomes: Progressing   Progress in Treatment: Attending groups: Yes. Participating in groups: Yes. Taking medication as prescribed: Yes. Toleration medication: Yes. Family/Significant  other contact made: Yes, individual(s) contacted:  SPE completed with the patient. Patient was unable to identify the contact information for any collateral contact.  Patient understands diagnosis: Yes. Discussing patient identified problems/goals with staff: Yes. Medical problems stabilized or resolved: Yes. Denies suicidal/homicidal ideation: Yes. Issues/concerns per patient self-inventory: No. Other: none  New problem(s) identified: No, Describe:  none   New Short Term/Long Term Goal(s): detox, elimination of symptoms of psychosis, medication management for mood stabilization; elimination of SI thoughts; development of comprehensive mental wellness/sobriety plan. Update 08/18/2022:  No changes at this time.    Patient Goals:  "get back on my medication and get outpatient that's probably the most important" Update 08/18/2022:  No changes at this time.    Discharge Plan or Barriers: Patient reports plans to return to his home.  Patient requesting assistance with establishing and aftercare provider. Update 08/18/2022:  No changes at this time.    Reason for Continuation of Hospitalization: Anxiety Depression Medication stabilization Suicidal ideation   Estimated Length of Stay:  1-7 days Update 08/18/2022:  TBD.   Last 3 [place] Suicide Severity Risk Score: Flowsheet Row Admission (Current) from 08/11/2022 in [facility] ED from 08/09/2022 in [facility] ED from 06/08/2022 in [facility] No Risk High Risk No Risk       Last PHQ 2/9 Scores:    05/11/2022    2:30 PM 03/31/2022    2:15 PM 01/14/2022    5:04 PM  Depression screen PHQ 2/9  Decreased Interest 1 0 1  Down, Depressed, Hopeless 1 0 0  PHQ - 2 Score 2 0 1  Altered sleeping 0    Tired, decreased energy 1    Change in appetite 1    Trouble concentrating 1    Moving slowly or fidgety/restless 0     Suicidal thoughts 0    PHQ-9 Score 5      Scribe for Treatment Team: Rozann Lesches, Marlinda Mike 08/18/2022 8:37 AM

## 2022-08-18 NOTE — Progress Notes (Signed)
  [facility] Adult Case Management Discharge Plan :  Will you be returning to the same living situation after discharge:  Yes,  pt reports that he is returning home.  At discharge, do you have transportation home?: Yes,  CSW to assist with transportation needs. Do you have the ability to pay for your medications: [employer] MEDICARE / AETNA MEDICARE HMO/PPO  Release of information consent forms completed and in the chart;  Patient's signature needed at discharge.  Patient to Follow up at:  Follow-up Information     [facility]Case Manager Follow up.   Why: She would like for you to call her at discharge [phone].        [facility] Follow up.   Why: Appointment is scheduled for 08/24/2022 at 12:30PM.  Please ARRIVE at 12 noon to complete paperwork. Contact information: [place] [address] [phone]                 Next level of care provider has access to Time and Suicide Prevention discussed: Yes,  SPE completed with the patient. Patient was unable to identify contact information for collateral contact.      Has patient been referred to the Quitline?: Patient refused referral  Patient has been referred for addiction treatment: Pt. refused referral  Rozann Lesches, LCSW 08/18/2022, 10:01 AM

## 2022-08-19 ENCOUNTER — Telehealth: Payer: Self-pay

## 2022-08-19 NOTE — Patient Outreach (Addendum)
  Care Coordination TOC Note Transition Care Management Unsuccessful Follow-up Telephone Call  Date of discharge and from where:  08/18/22-[facility]  Attempts:  1st Attempt  Reason for unsuccessful TCM follow-up call:  No answer/busy    Enzo Montgomery, RN,BSN,CCM [place] Management Telephonic Care Management Coordinator Direct Phone: [phone] Toll Free: [phone] Fax: [phone]

## 2022-08-19 NOTE — Patient Outreach (Signed)
  Care Coordination TOC Note Transition Care Management Unsuccessful Follow-up Telephone Call  Date of discharge and from where:  08/18/22-[facility]  Attempts:  2nd Attempt  Reason for unsuccessful TCM follow-up call:  Unable to reach patient   Hetty Blend Rafael Hernandez Management Telephonic Care Management Coordinator Direct Phone: [phone] Toll Free: [phone] Fax: [phone]

## 2022-08-20 ENCOUNTER — Telehealth: Payer: Self-pay

## 2022-08-20 NOTE — Patient Outreach (Signed)
  Care Coordination TOC Note Transition Care Management Unsuccessful Follow-up Telephone Call  Date of discharge and from where:  08/18/22-[facility]  Attempts:  3rd Attempt  Reason for unsuccessful TCM follow-up call:  Left voice message   Enzo Montgomery, RN,BSN,CCM [place] Management Telephonic Care Management Coordinator Direct Phone: [phone] Toll Free: [phone] Fax: [phone]

## 2022-08-23 ENCOUNTER — Encounter: Payer: Medicare HMO | Admitting: Internal Medicine

## 2022-08-23 ENCOUNTER — Telehealth: Payer: Self-pay | Admitting: Licensed Clinical Social Worker

## 2022-08-24 ENCOUNTER — Telehealth: Payer: Self-pay | Admitting: Licensed Clinical Social Worker

## 2022-08-24 NOTE — Patient Outreach (Addendum)
  Care Coordination   Initial Visit Note   08/24/2022 Name: Robert Chan MRN: [id number] DOB: Dec 18, 1955  Unknown Robert Chan is a 66 y.o. year old male who sees Robert Stain, MD for primary care. I spoke with  Robert Chan by phone today.  What matters to the patients health and wellness today?  Care Coordination/Medications    Goals Addressed             This Visit's Progress    Obtain Supportive Resources/Managment of MH Conditions   On track    Care Coordination Interventions: Active listening / Reflection utilized  Emotional Support Provided Participation in counseling encouraged  Verbalization of feelings encouraged  Pt reports he was recently discharged from hospital "I had a mental problem going on" Pt agreed to call pharmacy to inquire about cost to fill Pt lives alone at [facility]. He has a strained relationship with daughter; however, resides nearby granddaughters Pt utilizes [employer] LCSW reviewed discharge summary with pt. He reports that he has spoken to Robert Chan. Pt is unable to attend appt scheduled for 10/24. LCSW provided him contact # and he will reschedule          SDOH assessments and interventions completed:  Yes  SDOH Interventions Today    Flowsheet Row Most Recent Value  SDOH Interventions   Housing Interventions Intervention Not Indicated  Transportation Interventions Intervention Not Indicated        Care Coordination Interventions Activated:  Yes  Care Coordination Interventions:  Yes, provided   Follow up plan: Follow up call scheduled for 1-2 weeks    Encounter Outcome:  Pt. Visit Completed   Christa See, MSW, [email or web address] Phone [phone] 11:12 AM

## 2022-08-24 NOTE — Patient Instructions (Signed)
Visit Information  Thank you for taking time to visit with me today. Please don't hesitate to contact me if I can be of assistance to you.   Following are the goals we discussed today:   Goals Addressed             This Visit's Progress    Obtain Supportive Resources/Managment of MH Conditions       Care Coordination Interventions: Active listening / Reflection utilized  Emotional Support Provided Participation in counseling encouraged  Verbalization of feelings encouraged  Pt called [facility] to inquire about cost to fill. Meds will be approx $20 and he agreed to fill meds on 09/01/22, "I'll be alright for a week" LCSW discussed importance of medication compliance and how it correlates to management of mental and physical health Follow up appt scheduled          Our next appointment is by telephone on 09/01/22 at 9:30 AM  Please call the care guide team at [phone] if you need to cancel or reschedule your appointment.   If you are experiencing a Mental Health or [place] or need someone to talk to, please call the Suicide and Crisis Lifeline: 988 call 911   Patient verbalizes understanding of instructions and care plan provided today and agrees to view in [place]. Active MyChart status and patient understanding of how to access instructions and care plan via MyChart confirmed with patient.     Christa See, MSW, [email or web address] Phone [phone] 11:17 AM

## 2022-08-24 NOTE — Patient Outreach (Signed)
  Care Coordination   Follow Up Visit Note   08/24/2022 Name: Robert Chan MRN: [id number] DOB: 06-May-1956  Robert Chan is a 66 y.o. year old male who sees Elsie Stain, MD for primary care. I spoke with  [name] by phone today.  What matters to the patients health and wellness today?  Medication Fill    Goals Addressed             This Visit's Progress    Obtain Supportive Resources/Managment of MH Conditions       Care Coordination Interventions: Active listening / Reflection utilized  Emotional Support Provided Participation in counseling encouraged  Verbalization of feelings encouraged  Pt called [facility] to inquire about cost to fill. Meds will be approx $20 and he agreed to fill meds on 09/01/22, "I'll be alright for a week" LCSW discussed importance of medication compliance and how it correlates to management of mental and physical health Follow up appt scheduled          SDOH assessments and interventions completed:  No     Care Coordination Interventions Activated:  Yes  Care Coordination Interventions:  Yes, provided   Follow up plan: Follow up call scheduled for 09/01/22    Encounter Outcome:  Pt. Visit Completed   Christa See, MSW, [email or web address] Phone [phone] 11:16 AM

## 2022-08-24 NOTE — Patient Instructions (Signed)
Visit Information  Thank you for taking time to visit with me today. Please don't hesitate to contact me if I can be of assistance to you.   Following are the goals we discussed today:   Goals Addressed             This Visit's Progress    Obtain Supportive Resources/Managment of MH Conditions   On track    Care Coordination Interventions: Active listening / Reflection utilized  Emotional Support Provided Participation in counseling encouraged  Verbalization of feelings encouraged  Pt reports he was recently discharged from hospital "I had a mental problem going on" Pt agreed to call pharmacy to inquire about cost to fill Pt lives alone at [facility]. He has a strained relationship with daughter; however, resides nearby granddaughters Pt utilizes [employer] LCSW reviewed discharge summary with pt. He reports that he has spoken to [name]. Pt is unable to attend appt scheduled for 10/24. LCSW provided him contact # and he will reschedule          If you are experiencing a Mental Health or [place] or need someone to talk to, please call the Suicide and Crisis Lifeline: 988 call 911   Patient verbalizes understanding of instructions and care plan provided today and agrees to view in [place]. Active MyChart status and patient understanding of how to access instructions and care plan via MyChart confirmed with patient.     Telephone follow up appointment with care management team member scheduled for:1-2 weeks  Christa See, MSW, [email or web address] Phone [phone] 11:13 AM

## 2022-08-30 ENCOUNTER — Other Ambulatory Visit: Payer: Self-pay

## 2022-08-30 ENCOUNTER — Encounter (HOSPITAL_COMMUNITY): Payer: Self-pay

## 2022-08-30 ENCOUNTER — Emergency Department (HOSPITAL_COMMUNITY)
Admission: EM | Admit: 2022-08-30 | Discharge: 2022-08-30 | Discharge status: Against Medical Advice | Payer: Medicare HMO | Attending: Emergency Medicine | Admitting: Emergency Medicine

## 2022-08-30 DIAGNOSIS — Z8616 Personal history of COVID-19: Secondary | ICD-10-CM | POA: Insufficient documentation

## 2022-08-30 DIAGNOSIS — R42 Dizziness and giddiness: Secondary | ICD-10-CM | POA: Insufficient documentation

## 2022-08-30 DIAGNOSIS — R0789 Other chest pain: Secondary | ICD-10-CM | POA: Insufficient documentation

## 2022-08-30 DIAGNOSIS — Z5321 Procedure and treatment not carried out due to patient leaving prior to being seen by health care provider: Secondary | ICD-10-CM | POA: Insufficient documentation

## 2022-08-30 DIAGNOSIS — H53149 Visual discomfort, unspecified: Secondary | ICD-10-CM | POA: Diagnosis not present

## 2022-08-30 LAB — URINALYSIS, ROUTINE W REFLEX MICROSCOPIC
Bilirubin Urine: NEGATIVE
Glucose, UA: NEGATIVE mg/dL
Hgb urine dipstick: NEGATIVE
Ketones, ur: NEGATIVE mg/dL
Leukocytes,Ua: NEGATIVE
Nitrite: NEGATIVE
Protein, ur: NEGATIVE mg/dL
Specific Gravity, Urine: 1.025 (ref 1.005–1.030)
pH: 5 (ref 5.0–8.0)

## 2022-08-30 LAB — BASIC METABOLIC PANEL
Anion gap: 10 (ref 5–15)
BUN: 9 mg/dL (ref 8–23)
CO2: 23 mmol/L (ref 22–32)
Calcium: 8.8 mg/dL — ABNORMAL LOW (ref 8.9–10.3)
Chloride: 106 mmol/L (ref 98–111)
Creatinine, Ser: 1.19 mg/dL (ref 0.61–1.24)
GFR, Estimated: >60 mL/min (ref >60)
Glucose, Bld: 125 mg/dL — ABNORMAL HIGH (ref 70–99)
Potassium: 4 mmol/L (ref 3.5–5.1)
Sodium: 139 mmol/L (ref 135–145)

## 2022-08-30 LAB — TROPONIN I (HIGH SENSITIVITY)
Troponin I (High Sensitivity): 10 ng/L (ref <18)
Troponin I (High Sensitivity): 8 ng/L (ref <18)

## 2022-08-30 LAB — CBC
HCT: 37.7 % — ABNORMAL LOW (ref 39.0–52.0)
Hemoglobin: 12.4 g/dL — ABNORMAL LOW (ref 13.0–17.0)
MCH: 30.8 pg (ref 26.0–34.0)
MCHC: 32.9 g/dL (ref 30.0–36.0)
MCV: 93.8 fL (ref 80.0–100.0)
Platelets: 257 10*3/uL (ref 150–400)
RBC: 4.02 MIL/uL — ABNORMAL LOW (ref 4.22–5.81)
RDW: 12.4 % (ref 11.5–15.5)
WBC: 7.5 10*3/uL (ref 4.0–10.5)
nRBC: 0 % (ref 0.0–0.2)

## 2022-08-30 NOTE — ED Provider Triage Note (Signed)
Emergency Medicine Provider Triage Evaluation Note  Robert Chan , a 66 y.o. male  was evaluated in triage.  Patient complains of 3 days of dizziness.  Says he is never felt like this, the closest he has been to feeling like this has been when he had COVID a couple years ago.  No blurred vision but a little bit of photophobia.  No falls.  Does report that the dizziness is at rest as well when he ambulates.  Feels as though the room is spinning.  Also complaining of some chest pain Review of Systems  Positive:  Negative:   Physical Exam  BP 108/72 (BP Location: Right Arm)   Pulse 72   Temp 98.6 F (37 C) (Oral)   Resp 17   Ht '6\' 4"'$  (1.93 m)   Wt 127.9 kg   SpO2 98%   BMI 34.33 kg/m  Gen:   Awake, no distress   Resp:  Normal effort  MSK:   Moves extremities without difficulty  Other:  Cranial nerves II through XII grossly intact.  No nystagmus on physical exam.  No problem with finger-nose testing.  Mildly diaphoretic  Medical Decision Making  Medically screening exam initiated at 1:16 PM.  Appropriate orders placed.  [name] was informed that the remainder of the evaluation will be completed by another provider, this initial triage assessment does not replace that evaluation, and the importance of remaining in the ED until their evaluation is complete.     Rhae Hammock, PA-C 08/30/22 1318

## 2022-08-30 NOTE — ED Triage Notes (Addendum)
Reports having dizziness x 3 days.  Reports being very uncomfortable.  Reports he doesn't feel his body will relax.  Reports the room is spinning. Also reports mild chest tightness.

## 2022-08-30 NOTE — ED Notes (Signed)
Pt called multiple times by staff, no answer, not seen in the lobby

## 2022-09-01 ENCOUNTER — Encounter: Payer: Self-pay | Admitting: Licensed Clinical Social Worker

## 2022-09-01 ENCOUNTER — Telehealth: Payer: Self-pay | Admitting: Licensed Clinical Social Worker

## 2022-09-01 NOTE — Patient Outreach (Signed)
  Care Coordination   09/01/2022 Name: Robert Chan MRN: [id number] DOB: 1956-05-29   Care Coordination Outreach Attempts:  An unsuccessful telephone outreach was attempted for a scheduled appointment today.  Follow Up Plan:  Additional outreach attempts will be made to offer the patient care coordination information and services.   Encounter Outcome:  No Answer  Care Coordination Interventions Activated:  No   Care Coordination Interventions:  No, not indicated    Christa See, MSW, [email or web address] Phone [phone] 9:56 AM

## 2022-09-06 ENCOUNTER — Ambulatory Visit: Payer: Medicare HMO | Admitting: Internal Medicine

## 2022-09-07 ENCOUNTER — Other Ambulatory Visit: Payer: Self-pay

## 2022-09-07 ENCOUNTER — Encounter (HOSPITAL_COMMUNITY): Payer: Self-pay

## 2022-09-07 ENCOUNTER — Emergency Department (HOSPITAL_COMMUNITY)
Admission: EM | Admit: 2022-09-07 | Discharge: 2022-09-08 | Disposition: A | Discharge status: Home / Self Care | Payer: Medicare HMO | Attending: Emergency Medicine | Admitting: Emergency Medicine

## 2022-09-07 ENCOUNTER — Emergency Department (HOSPITAL_COMMUNITY): Payer: Medicare HMO

## 2022-09-07 DIAGNOSIS — X58XXXA Exposure to other specified factors, initial encounter: Secondary | ICD-10-CM | POA: Insufficient documentation

## 2022-09-07 DIAGNOSIS — D649 Anemia, unspecified: Secondary | ICD-10-CM | POA: Insufficient documentation

## 2022-09-07 DIAGNOSIS — I700 Atherosclerosis of aorta: Secondary | ICD-10-CM | POA: Diagnosis not present

## 2022-09-07 DIAGNOSIS — K5730 Diverticulosis of large intestine without perforation or abscess without bleeding: Secondary | ICD-10-CM | POA: Diagnosis not present

## 2022-09-07 DIAGNOSIS — M545 Low back pain, unspecified: Secondary | ICD-10-CM | POA: Diagnosis present

## 2022-09-07 DIAGNOSIS — N189 Chronic kidney disease, unspecified: Secondary | ICD-10-CM | POA: Diagnosis not present

## 2022-09-07 DIAGNOSIS — I2510 Atherosclerotic heart disease of native coronary artery without angina pectoris: Secondary | ICD-10-CM | POA: Insufficient documentation

## 2022-09-07 DIAGNOSIS — S39012A Strain of muscle, fascia and tendon of lower back, initial encounter: Secondary | ICD-10-CM | POA: Insufficient documentation

## 2022-09-07 DIAGNOSIS — J449 Chronic obstructive pulmonary disease, unspecified: Secondary | ICD-10-CM | POA: Diagnosis not present

## 2022-09-07 DIAGNOSIS — Z7951 Long term (current) use of inhaled steroids: Secondary | ICD-10-CM | POA: Insufficient documentation

## 2022-09-07 DIAGNOSIS — Z9049 Acquired absence of other specified parts of digestive tract: Secondary | ICD-10-CM | POA: Diagnosis not present

## 2022-09-07 DIAGNOSIS — Z79899 Other long term (current) drug therapy: Secondary | ICD-10-CM | POA: Diagnosis not present

## 2022-09-07 DIAGNOSIS — I129 Hypertensive chronic kidney disease with stage 1 through stage 4 chronic kidney disease, or unspecified chronic kidney disease: Secondary | ICD-10-CM | POA: Diagnosis not present

## 2022-09-07 LAB — BASIC METABOLIC PANEL
Anion gap: 5 (ref 5–15)
BUN: 7 mg/dL — ABNORMAL LOW (ref 8–23)
CO2: 22 mmol/L (ref 22–32)
Calcium: 8.7 mg/dL — ABNORMAL LOW (ref 8.9–10.3)
Chloride: 111 mmol/L (ref 98–111)
Creatinine, Ser: 1.11 mg/dL (ref 0.61–1.24)
GFR, Estimated: >60 mL/min (ref >60)
Glucose, Bld: 137 mg/dL — ABNORMAL HIGH (ref 70–99)
Potassium: 4 mmol/L (ref 3.5–5.1)
Sodium: 138 mmol/L (ref 135–145)

## 2022-09-07 LAB — CBC WITH DIFFERENTIAL/PLATELET
Abs Immature Granulocytes: 0.01 10*3/uL (ref 0.00–0.07)
Basophils Absolute: 0.1 10*3/uL (ref 0.0–0.1)
Basophils Relative: 1 %
Eosinophils Absolute: 0.4 10*3/uL (ref 0.0–0.5)
Eosinophils Relative: 5 %
HCT: 37.9 % — ABNORMAL LOW (ref 39.0–52.0)
Hemoglobin: 12.1 g/dL — ABNORMAL LOW (ref 13.0–17.0)
Immature Granulocytes: 0 %
Lymphocytes Relative: 31 %
Lymphs Abs: 2.3 10*3/uL (ref 0.7–4.0)
MCH: 30.6 pg (ref 26.0–34.0)
MCHC: 31.9 g/dL (ref 30.0–36.0)
MCV: 95.9 fL (ref 80.0–100.0)
Monocytes Absolute: 0.7 10*3/uL (ref 0.1–1.0)
Monocytes Relative: 9 %
Neutro Abs: 4.1 10*3/uL (ref 1.7–7.7)
Neutrophils Relative %: 54 %
Platelets: 239 10*3/uL (ref 150–400)
RBC: 3.95 MIL/uL — ABNORMAL LOW (ref 4.22–5.81)
RDW: 12.8 % (ref 11.5–15.5)
WBC: 7.6 10*3/uL (ref 4.0–10.5)
nRBC: 0 % (ref 0.0–0.2)

## 2022-09-07 LAB — URINALYSIS, ROUTINE W REFLEX MICROSCOPIC
Bilirubin Urine: NEGATIVE
Glucose, UA: NEGATIVE mg/dL
Hgb urine dipstick: NEGATIVE
Ketones, ur: NEGATIVE mg/dL
Leukocytes,Ua: NEGATIVE
Nitrite: NEGATIVE
Protein, ur: NEGATIVE mg/dL
Specific Gravity, Urine: 1.025 (ref 1.005–1.030)
pH: 5 (ref 5.0–8.0)

## 2022-09-07 NOTE — ED Triage Notes (Signed)
Patient complains of left flank pain x 2 days. Denies dysuria, no hx of stones. No trouble urinating

## 2022-09-07 NOTE — ED Provider Triage Note (Signed)
Emergency Medicine Provider Triage Evaluation Note  Robert Chan , a 66 y.o. male  was evaluated in triage.  Pt complains of Left flank pain for 2 days.  No hematuria, dysuria, cough, chest pain.  Worse with movement, feels sharp..  Review of Systems  Per HPI  Physical Exam  BP 124/88 (BP Location: Left Arm)   Pulse 63   Temp 97.9 F (36.6 C) (Oral)   Resp 16   SpO2 97%  Gen:   Awake, no distress   Resp:  Normal effort  MSK:   Moves extremities without difficulty  Other:  Left CVA tenderness  Medical Decision Making  Medically screening exam initiated at 2:12 PM.  Appropriate orders placed.  Robert Chan was informed that the remainder of the evaluation will be completed by another provider, this initial triage assessment does not replace that evaluation, and the importance of remaining in the ED until their evaluation is complete.     Sherrill Raring, PA-C 09/07/22 1413

## 2022-09-07 NOTE — ED Notes (Signed)
Patient requesting pain medicine. Triage RN Mortimer Fries notified

## 2022-09-08 DIAGNOSIS — S39012A Strain of muscle, fascia and tendon of lower back, initial encounter: Secondary | ICD-10-CM | POA: Diagnosis not present

## 2022-09-08 LAB — URINE CULTURE: Culture: NO GROWTH

## 2022-09-08 MED ORDER — NAPROXEN 375 MG PO TABS: 375.0000 mg | ORAL_TABLET | Freq: Two times a day (BID) | ORAL | 0 refills | Status: DC

## 2022-09-08 MED ORDER — CYCLOBENZAPRINE HCL 10 MG PO TABS
10.0000 mg | ORAL_TABLET | Freq: Once | ORAL | Status: AC
  Administered 2022-09-08: 10 mg via ORAL
  Filled 2022-09-08: qty 1

## 2022-09-08 MED ORDER — ACETAMINOPHEN 325 MG PO TABS
650.0000 mg | ORAL_TABLET | Freq: Once | ORAL | Status: AC
  Administered 2022-09-08: 650 mg via ORAL
  Filled 2022-09-08: qty 2

## 2022-09-08 MED ORDER — ORPHENADRINE CITRATE ER 100 MG PO TB12: 100.0000 mg | ORAL_TABLET | Freq: Two times a day (BID) | ORAL | 0 refills | Status: DC

## 2022-09-08 MED ORDER — NAPROXEN 250 MG PO TABS
375.0000 mg | ORAL_TABLET | Freq: Once | ORAL | Status: AC
  Administered 2022-09-08: 375 mg via ORAL
  Filled 2022-09-08: qty 2

## 2022-09-08 NOTE — Discharge Instructions (Signed)
Apply ice for 30 minutes at a time, 4 times a day.  Take the anti-inflammatory medication and muscle relaxer as prescribed.  If you need additional pain relief, you may add acetaminophen.

## 2022-09-08 NOTE — ED Provider Notes (Signed)
[facility] EMERGENCY DEPARTMENT Provider Note   CSN: [id number] Arrival date & time: 09/07/22  1212     History  Chief complaint: Left flank pain  Robert Chan is a 66 y.o. male.  The history is provided by the patient.  He has history of hypertension, COPD, chronic kidney disease, coronary artery disease and comes in complaining of pain in the left lumbar area for the last 3 days.  He thinks it is a kidney stone.  Pain does not radiate.  He denies difficulty urinating and denies nausea or vomiting.  He has taken acetaminophen for pain without relief.  He states that he does work out but does not recall if he could have injured himself.   Home Medications Prior to Admission medications   Medication Sig Start Date End Date Taking? Authorizing Provider  naproxen (NAPROSYN) 375 MG tablet Take 1 tablet (375 mg total) by mouth 2 (two) times daily. 35/3/29  Yes Delora Fuel, MD  orphenadrine (NORFLEX) 100 MG tablet Take 1 tablet (100 mg total) by mouth 2 (two) times daily. 92/4/26  Yes Delora Fuel, MD  albuterol (VENTOLIN HFA) 108 (90 Base) MCG/ACT inhaler Inhale 1-2 puffs into the lungs every 6 (six) hours as needed for wheezing or shortness of breath. 08/18/22   Clapacs, Madie Reno, MD  ARIPiprazole (ABILIFY) 10 MG tablet Take 1 tablet (10 mg total) by mouth daily. 08/18/22   Clapacs, Madie Reno, MD  atorvastatin (LIPITOR) 80 MG tablet Take 1 tablet (80 mg total) by mouth daily. 08/18/22 08/19/23  Clapacs, Madie Reno, MD  diltiazem (CARDIZEM CD) 240 MG 24 hr capsule Take 1 capsule (240 mg total) by mouth daily. 08/18/22   Clapacs, Madie Reno, MD  doxepin (SINEQUAN) 50 MG capsule Take 1 capsule (50 mg total) by mouth at bedtime. 08/18/22   Clapacs, Madie Reno, MD  latanoprost (XALATAN) 0.005 % ophthalmic solution Place 1 drop into both eyes at bedtime. 08/18/22   Clapacs, Madie Reno, MD  senna-docusate (SENOKOT-S) 8.6-50 MG tablet Take 2 tablets by mouth daily at 8 pm. 08/18/22   Clapacs, Madie Reno, MD      Allergies    Shellfish allergy and Shellfish-derived products    Review of Systems   Review of Systems  All other systems reviewed and are negative.   Physical Exam Updated Vital Signs BP (!) 154/92 (BP Location: Right Arm)   Pulse (!) 59   Temp (!) 97.5 F (36.4 C) (Oral)   Resp 20   SpO2 99%  Physical Exam Vitals and nursing note reviewed.   66 year old male, resting comfortably and in no acute distress. Vital signs are significant for mildly elevated blood pressure and borderline slow heart rate. Oxygen saturation is 99%, which is normal. Head is normocephalic and atraumatic. PERRLA, EOMI. Oropharynx is clear. Neck is nontender and supple without adenopathy or JVD. Back is mildly tender in the left paralumbar area with no midline tenderness.  There is moderate left paralumbar spasm.  Straight leg raise is positive on the left at 45 degrees, negative on the right.  There is no CVA tenderness. Lungs are clear without rales, wheezes, or rhonchi. Chest is nontender. Heart has regular rate and rhythm without murmur. Abdomen is soft, flat, nontender. Extremities have no cyanosis or edema, full range of motion is present. Skin is warm and dry without rash. Neurologic: Mental status is normal, cranial nerves are intact, moves all extremities equally.  ED Results / Procedures / Treatments  Labs (all labs ordered are listed, but only abnormal results are displayed) Labs Reviewed  CBC WITH DIFFERENTIAL/PLATELET - Abnormal; Notable for the following components:      Result Value   RBC 3.95 (*)    Hemoglobin 12.1 (*)    HCT 37.9 (*)    All other components within normal limits  BASIC METABOLIC PANEL - Abnormal; Notable for the following components:   Glucose, Bld 137 (*)    BUN 7 (*)    Calcium 8.7 (*)    All other components within normal limits  URINE CULTURE  URINALYSIS, ROUTINE W REFLEX MICROSCOPIC   Radiology CT Renal Stone Study  Result Date:  09/07/2022 CLINICAL DATA:  Flank pain.  Evaluate for kidney stone. EXAM: CT ABDOMEN AND PELVIS WITHOUT CONTRAST TECHNIQUE: Multidetector CT imaging of the abdomen and pelvis was performed following the standard protocol without IV contrast. RADIATION DOSE REDUCTION: This exam was performed according to the departmental dose-optimization program which includes automated exposure control, adjustment of the mA and/or kV according to patient size and/or use of iterative reconstruction technique. COMPARISON:  None Available. FINDINGS: Lower chest: No acute abnormality. Hepatobiliary: 2 cyst identified measuring up to 1.4 cm. No suspicious liver abnormality. Gallbladder appears normal. No bile duct dilatation. Pancreas: Unremarkable. No pancreatic ductal dilatation or surrounding inflammatory changes. Spleen: Normal in size without focal abnormality. Adrenals/Urinary Tract: Normal adrenal glands. No kidney stones identified bilaterally. No hydronephrosis or mass. No signs of hydroureter or ureteral lithiasis. Decompressed urinary bladder is unremarkable. Stomach/Bowel: Stomach appears normal. Status post appendectomy. Descending colonic diverticula noted without signs of acute diverticulitis. No bowel wall thickening, inflammation or distension. Vascular/Lymphatic: Aortic atherosclerosis. No aneurysm. No abdominopelvic adenopathy. Reproductive: Prostate gland appears surgically absent. Other: No free fluid or fluid collections. No signs of pneumoperitoneum. Musculoskeletal: No acute or significant osseous findings. IMPRESSION: 1. No acute findings within the abdomen or pelvis. 2. No kidney stones or hydronephrosis. 3. Descending colonic diverticulosis without signs of acute diverticulitis. 4. Status post appendectomy and prostatectomy. 5.  Aortic Atherosclerosis (ICD10-I70.0). Electronically Signed   By: Kerby Moors M.D.   On: 09/07/2022 15:11    Procedures Procedures    Medications Ordered in ED Medications   cyclobenzaprine (FLEXERIL) tablet 10 mg (has no administration in time range)  naproxen (NAPROSYN) tablet 375 mg (has no administration in time range)  acetaminophen (TYLENOL) tablet 650 mg (has no administration in time range)    ED Course/ Medical Decision Making/ A&P                           Medical Decision Making Risk OTC drugs. Prescription drug management.   Left lumbar pain which appears to be musculoskeletal based on exam.  Consider kidney stone, pyelonephritis.  Doubt lumbar radiculopathy.  I have reviewed and interpreted his laboratory test, my interpretation is mild elevation of random glucose similar to prior, mild anemia which is unchanged from prior, normal urinalysis.  CT shows no evidence of kidney stones or hydronephrosis, no acute process.  I have independently viewed the images, and agree with radiologist interpretation.  I have ordered a dose of naproxen, cyclobenzaprine, acetaminophen and I am discharging him with prescriptions for naproxen and orphenadrine.  Advised to apply ice, added acetaminophen as needed for additional pain relief.  Follow-up with PCP.  Final Clinical Impression(s) / ED Diagnoses Final diagnoses:  Acute myofascial strain of lumbar region, initial encounter  Normochromic normocytic anemia    Rx / DC  Orders ED Discharge Orders          Ordered    naproxen (NAPROSYN) 375 MG tablet  2 times daily        09/08/22 0138    orphenadrine (NORFLEX) 100 MG tablet  2 times daily        09/08/22 1610              Delora Fuel, MD 96[birth year]/54 [phone]

## 2022-09-10 ENCOUNTER — Telehealth: Payer: Self-pay | Admitting: Licensed Clinical Social Worker

## 2022-09-10 NOTE — Patient Outreach (Signed)
  Care Coordination   09/10/2022 Name: Robert Chan MRN: [id number] DOB: Jun 19, 1956   Care Coordination Outreach Attempts:  An unsuccessful telephone outreach was attempted today to offer the patient information about available care coordination services as a benefit of their health plan.   Follow Up Plan:  Additional outreach attempts will be made to offer the patient care coordination information and services.   Encounter Outcome:  No Answer  Care Coordination Interventions Activated:  No   Care Coordination Interventions:  No, not indicated    Christa See, MSW, [email or web address] Phone [phone] 4:19 PM

## 2022-09-13 ENCOUNTER — Telehealth: Payer: Self-pay | Admitting: Licensed Clinical Social Worker

## 2022-09-13 NOTE — Patient Outreach (Signed)
  Care Coordination   09/13/2022 Name: Robert Chan MRN: [id number] DOB: 11-13-1955   Care Coordination Outreach Attempts:  A second unsuccessful outreach was attempted today to offer the patient with information about available care coordination services as a benefit of their health plan.     Follow Up Plan:  Additional outreach attempts will be made to offer the patient care coordination information and services.   Encounter Outcome:  No Answer  Care Coordination Interventions Activated:  No   Care Coordination Interventions:  No, not indicated    Christa See, MSW, [email or web address] Phone [phone] 4:31 PM

## 2022-09-14 DIAGNOSIS — Z419 Encounter for procedure for purposes other than remedying health state, unspecified: Secondary | ICD-10-CM

## 2022-09-14 LAB — GLUCOSE, POCT (MANUAL RESULT ENTRY): POC Glucose: 133 mg/dl — AB (ref 70–99)

## 2022-09-14 NOTE — Telephone Encounter (Signed)
Pt returning call

## 2022-09-14 NOTE — Congregational Nurse Program (Signed)
  Dept: [phone]   Congregational Nurse Program Note  Date of Encounter: 09/14/2022  Clinic visit to have blood pressure and blood sugar levels checked.  States he went to emergency room and wanted to have recheck.  BP was elevated at ER visit, 115/70 today, blood glucose elevated at ER visit 133 today after lunch. He has not had any more episodes of the leg pain but has been more tired than usual and getting up at night to urinate.  Advised resident to make appointment with his PCP to follow-up on elevated blood sugar.  Educate regarding symptoms of high blood glucose levels, types of carbohydrates, benefits of increasing vegetables and limiting processed foods with lots of added sugars. 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 Yes    Student Assistance N/A    Location Patient [place]    Visit Setting with Client Organization    Patient Status Unknown   Has apartment at [facility]    Insurance/Financial Assistance Referral N/A    Medication N/A    Medical Provider Yes    Screening Referrals Made N/A    Medical Referrals Made [facility]CP[facility]    Medical Appointment Made N/A    Recently w/o PCP, now 1st time PCP visit completed due to CNs referral or appointment  made N/A    Food N/A    Transportation Need transportation assistance    Housing/Utilities N/A    Interpersonal Safety N/A    Interventions Advocate/Support;Counsel;Educate;Reviewed D/C Planning    Abnormal to Normal Screening Since Last CN Visit N/A    Screenings CN Performed Blood Pressure;Blood Glucose;Weight    Sent Client to Lab for: N/A    Did client attend any of the following based off CNs referral or appointments made? N/A    ED Visit Averted N/A    Life-Saving Intervention Made N/A

## 2022-09-14 NOTE — Telephone Encounter (Signed)
Patient returning call back. Please call back

## 2022-09-15 ENCOUNTER — Telehealth: Payer: Self-pay | Admitting: Licensed Clinical Social Worker

## 2022-09-15 NOTE — Patient Outreach (Addendum)
  Care Coordination   09/15/2022 Name: Robert Chan MRN: [id number] DOB: Apr 07, 1956   Care Coordination Outreach Attempts:  An unsuccessful telephone outreach was attempted today to offer the patient information about available care coordination services as a benefit of their health plan.   Follow Up Plan:  Patient has returned LCSW's call and left message. LCSW called patient and left a voicemail for a call back at his earliest convenience.  Encounter Outcome:  No Answer  Care Coordination Interventions Activated:  No   Care Coordination Interventions:  No, not indicated    Christa See, MSW, [email or web address] Phone [phone] 4:17 PM

## 2022-09-17 ENCOUNTER — Telehealth: Payer: Self-pay | Admitting: Licensed Clinical Social Worker

## 2022-09-17 NOTE — Patient Outreach (Signed)
  Care Coordination   09/17/2022 Name: Robert Chan MRN: [id number] DOB: Mar 04, 1956   Care Coordination Outreach Attempts:  A second unsuccessful outreach was attempted today to offer the patient with information about available care coordination services as a benefit of their health plan.     Follow Up Plan:  Additional outreach attempts will be made to offer the patient care coordination information and services.   Encounter Outcome:  No Answer  Care Coordination Interventions Activated:  No   Care Coordination Interventions:  No, not indicated    Christa See, MSW, [email or web address] Phone [phone] 4:25 PM

## 2022-09-21 DIAGNOSIS — Z419 Encounter for procedure for purposes other than remedying health state, unspecified: Secondary | ICD-10-CM

## 2022-09-21 LAB — GLUCOSE, POCT (MANUAL RESULT ENTRY): POC Glucose: 134 mg/dl — AB (ref 70–99)

## 2022-09-21 NOTE — Congregational Nurse Program (Signed)
  Dept: [phone]   Congregational Nurse Program Note  Date of Encounter: 09/21/2022  Clinic visit for blood pressure and blood glucose checks.  BP 128/75, pulse 70 and regular, O2 Sat 98%. Blood glucose 134 approximately 1.5 hours after lunch of rice and hot dogs.  Discussed normal pre and post meal blood glucose levels and benefits of increasing intake of vegetables.  States that he is eating less cookies and candy than in previous weeks.  Encouraged to continue the changes he has made and will have A1c checked when he goes to MD appointment. 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 Yes    Student Assistance N/A    Location Patient [place]    Visit Setting with Client Organization    Patient Status Unknown   Has apartment at [facility] [place]    Insurance/Financial Assistance Referral N/A    Medication N/A    Medical Provider Yes    Screening Referrals Made N/A    Medical Referrals Made [facility]CP[facility]    Medical Appointment Made N/A    Recently w/o PCP, now 1st time PCP visit completed due to CNs referral or appointment made N/A    Food N/A    Transportation Need transportation assistance    Housing/Utilities N/A    Interpersonal Safety N/A    Interventions  Advocate/Support;Counsel;Educate    Abnormal to Normal Screening Since Last CN Visit N/A    Screenings CN Performed Blood Pressure;Blood Glucose;ABI    Sent Client to Lab for: N/A    Did client attend any of the following based off CNs referral or appointments made? N/A    ED Visit Averted N/A    Life-Saving Intervention Made N/A

## 2022-09-28 DIAGNOSIS — Z419 Encounter for procedure for purposes other than remedying health state, unspecified: Secondary | ICD-10-CM

## 2022-09-28 NOTE — Congregational Nurse Program (Signed)
  Dept: [phone]   Congregational Nurse Program Note  Date of Encounter: 09/28/2022  Clinic visit to have blood glucose checked.  States he has been limiting candy and other sweets.  BP 123/74, pulse 80 and regular, O2 Sat 98%.  Blood glucose 102 PC lunch.  Educated regarding types of vegetables to help manage blood glucose levels. 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 Yes    Student Assistance N/A    Location Patient Robert [place]    Visit Setting with Client Organization    Patient Status Unknown   Has apartment at [facility]    Insurance/Financial Assistance Referral N/A    Medication N/A    Medical Provider Yes    Screening Referrals Made N/A    Medical Referrals Made N/A    Medical Appointment Made N/A    Recently w/o PCP, now 1st time PCP visit completed due to CNs referral or appointment made N/A    Food N/A    Transportation Need transportation assistance    Housing/Utilities N/A    Interpersonal Safety N/A    Interventions Advocate/Support;Counsel;Educate    Abnormal to Normal Screening Since Last CN Visit N/A    Screenings CN Performed Blood Pressure;Blood Glucose;ABI    Sent Client to Lab for: N/A    Did client attend any of the following based  off CNs referral or appointments made? N/A    ED Visit Averted N/A    Life-Saving Intervention Made N/A

## 2022-09-30 ENCOUNTER — Telehealth: Payer: Self-pay | Admitting: Licensed Clinical Social Worker

## 2022-10-01 NOTE — Patient Instructions (Signed)
Visit Information  Thank you for taking time to visit with me today. Please don't hesitate to contact me if I can be of assistance to you.   Following are the goals we discussed today:   Goals Addressed             This Visit's Progress    Obtain Supportive Resources/Managment of MH Conditions   On track    Care Coordination Interventions: Active listening / Reflection utilized  Emotional Support Provided Participation in counseling encouraged  Verbalization of feelings encouraged  LCSW reviewed hospital admissions/ED visits with patient. States he was concerned about chest pain in October and kidney pain in November. During visit, they cleared him for any kidney concerns and felt he may have pulled a muscle resulting in a prescription for muscle relaxer. Pt shared that his pain is managed and he has minimized lifting weights to prevent injury Patient reports recent inpatient admission in [facility] hospital for irritability and anger Patient was successful in identifying triggers to difficulty managing symptoms. He reports having a f/up appt with an outpatient mental health provider scheduled; however, was unable to provide details to LCSW LCSW provided crisis intervention resources, noting pt denies current SI/HI. Strongly encouraged pt to follow up with appts and comply with prescribed medications to manage symptoms Reviewed upcoming appts. Provided # for GI appt and encouraged him to call LCSW collaborated with [facility] [place], about pt hx. Strategies to strengthen support system discussed Encouraged Patient to schedule f/up appt with PCP          Our next appointment is by telephone on 10/08/22 at 11 AM  Please call the care guide team at [phone] if you need to cancel or reschedule your appointment.   If you are experiencing a Mental Health or [place] or need someone to talk to, please call the Suicide and Crisis Lifeline: 988 call 911   Patient  verbalizes understanding of instructions and care plan provided today and agrees to view in [place]. Active MyChart status and patient understanding of how to access instructions and care plan via MyChart confirmed with patient.     Christa See, MSW, [email or web address] Phone [phone] 8:05 AM

## 2022-10-01 NOTE — Patient Outreach (Signed)
  Care Coordination   Follow Up Visit Note   10/01/2022 Name: Robert Chan MRN: [id number] DOB: 1956-10-14  Robert Chan is a 66 y.o. year old male who sees Elsie Stain, MD for primary care. I spoke with  [name] by phone today.  What matters to the patients health and wellness today?  Management of depression and anxiety symptoms, crisis intervention resources    Goals Addressed             This Visit's Progress    Obtain Supportive Resources/Managment of MH Conditions   On track    Care Coordination Interventions: Active listening / Reflection utilized  Emotional Support Provided Participation in counseling encouraged  Verbalization of feelings encouraged  LCSW reviewed hospital admissions/ED visits with patient. States he was concerned about chest pain in October and kidney pain in November. During visit, they cleared him for any kidney concerns and felt he may have pulled a muscle resulting in a prescription for muscle relaxer. Pt shared that his pain is managed and he has minimized lifting weights to prevent injury Patient reports recent inpatient admission in [facility] hospital for irritability and anger Patient was successful in identifying triggers to difficulty managing symptoms. He reports having a f/up appt with an outpatient mental health provider scheduled; however, was unable to provide details to LCSW LCSW provided crisis intervention resources, noting pt denies current SI/HI. Strongly encouraged pt to follow up with appts and comply with prescribed medications to manage symptoms Reviewed upcoming appts. Provided # for GI appt and encouraged him to call LCSW collaborated with [facility], [place], about pt hx. Strategies to strengthen support system discussed Encouraged Patient to schedule f/up appt with PCP          SDOH assessments and interventions completed:  No     Care Coordination Interventions:  Yes, provided   Follow up plan:  Follow up call scheduled for 1-2 weeks    Encounter Outcome:  Pt. Visit Completed   Christa See, MSW, [email or web address] Phone [phone] 8:03 AM

## 2022-10-06 ENCOUNTER — Ambulatory Visit: Payer: Medicare HMO | Admitting: Internal Medicine

## 2022-10-08 ENCOUNTER — Encounter: Payer: Self-pay | Admitting: Licensed Clinical Social Worker

## 2022-10-08 ENCOUNTER — Telehealth: Payer: Self-pay | Admitting: Licensed Clinical Social Worker

## 2022-10-08 IMAGING — DX DG CHEST 2V
2 series · 2 of 2 positions shown · non-contrast
Comparison: 10/06/2020

CLINICAL DATA: Chest tightness and shortness of breath.

EXAM:
CHEST - 2 VIEW

[chest pa]
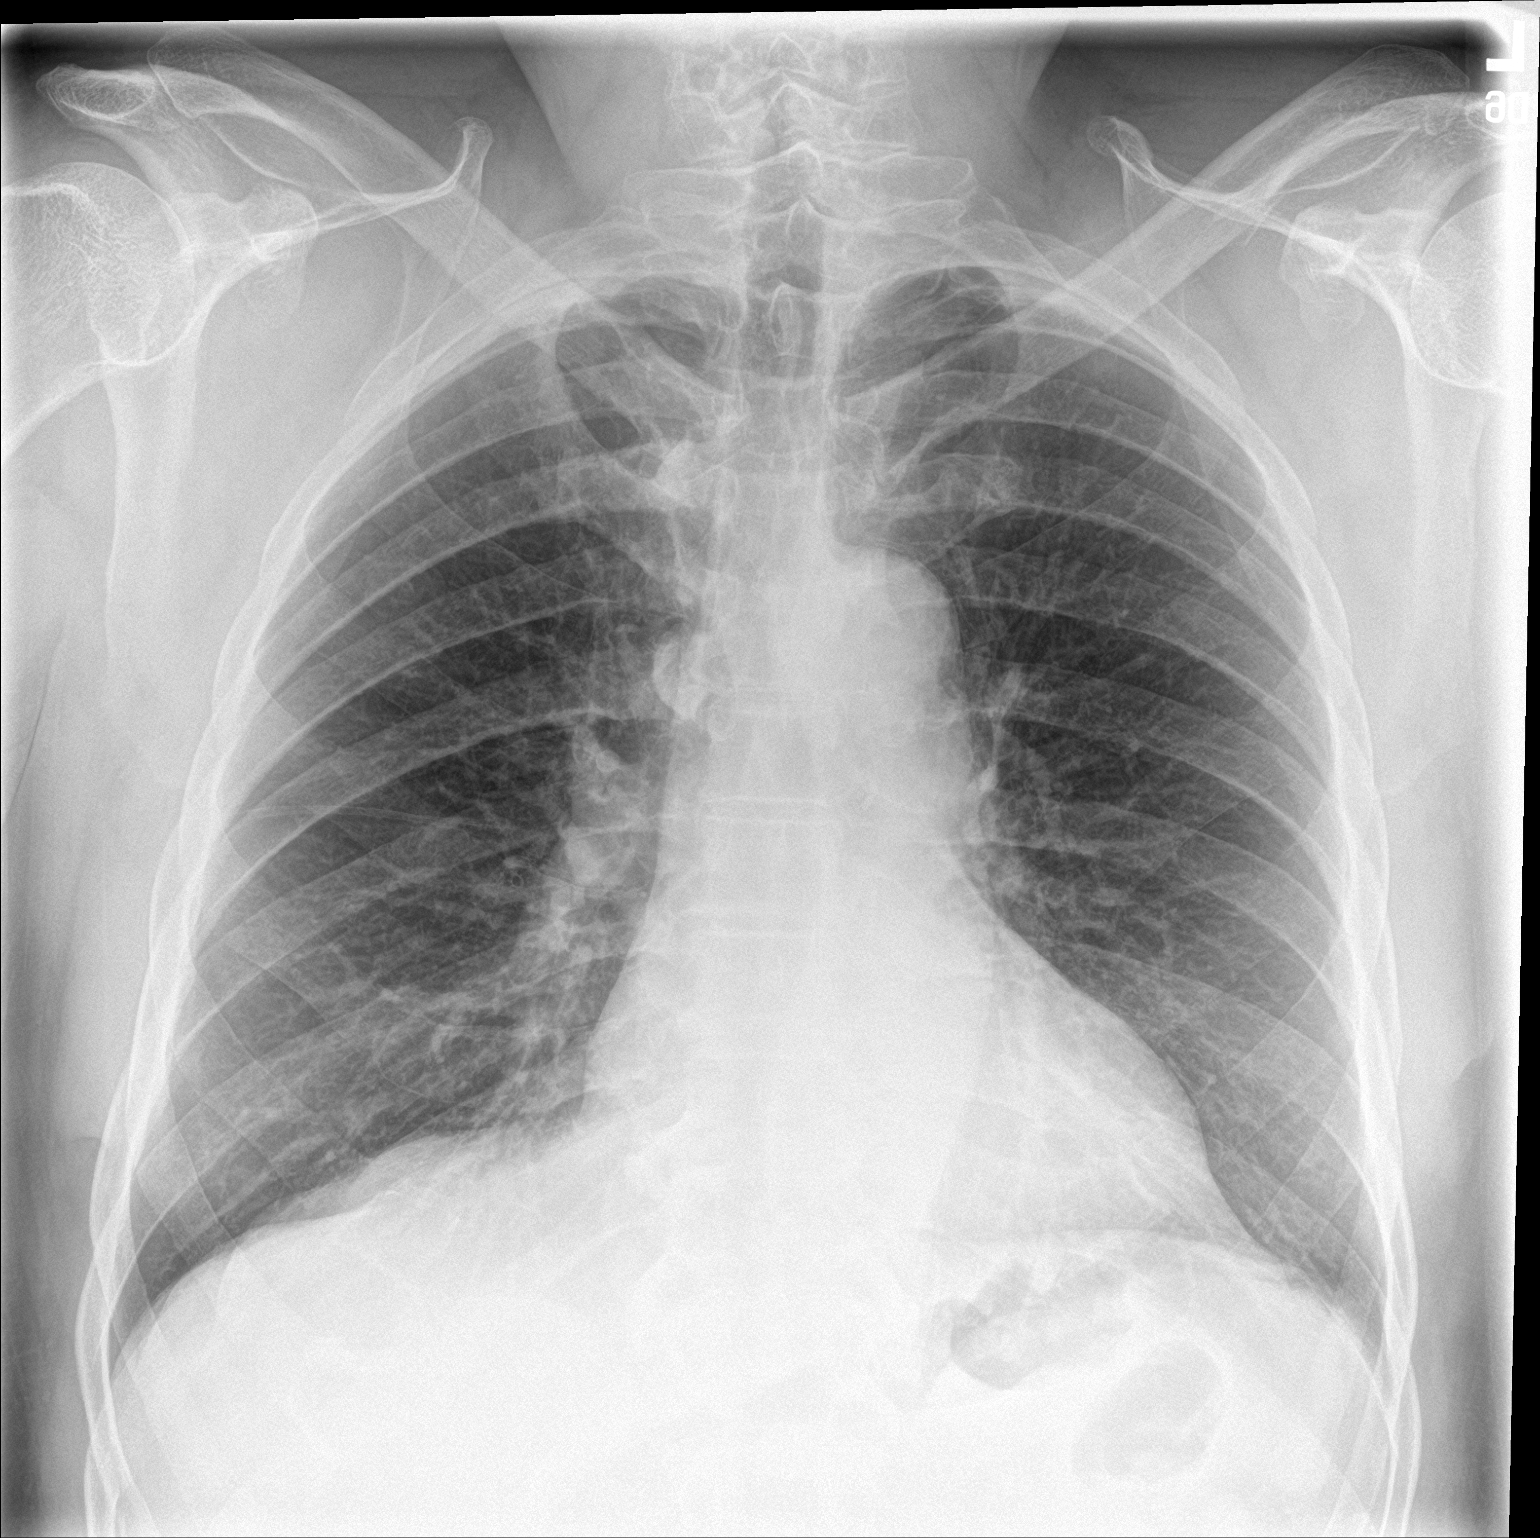

[chest lat]
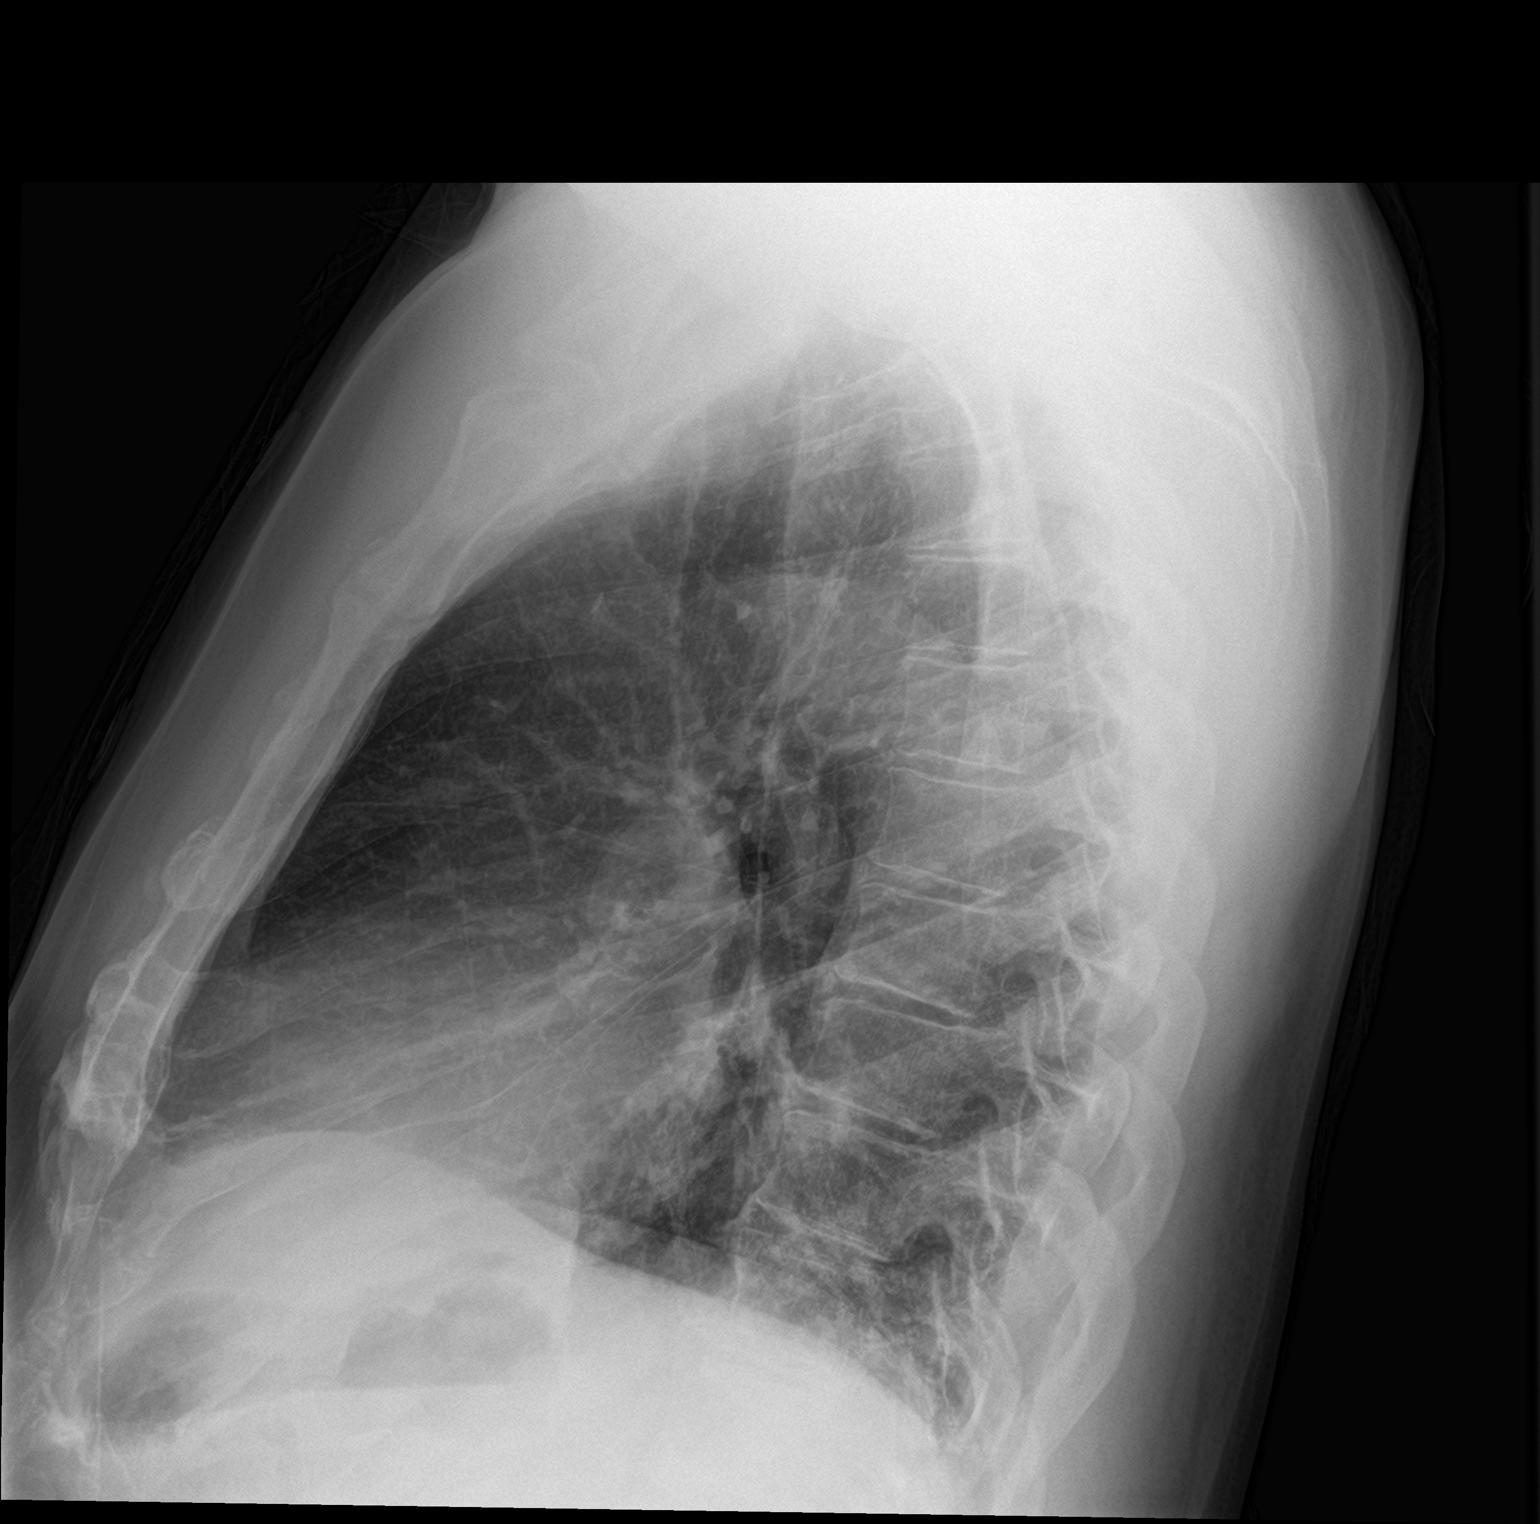

[2 of 2 positions shown; findings below may reference images not displayed]

FINDINGS: Normal heart size. There is no pleural effusion or edema identified.
No airspace densities identified. Review of the visualized osseous
structures is unremarkable.
IMPRESSION: No active cardiopulmonary abnormalities.

## 2022-10-08 NOTE — Patient Outreach (Signed)
  Care Coordination   10/08/2022 Name: Robert Chan MRN: [id number] DOB: 07-06-1956   Care Coordination Outreach Attempts:  An unsuccessful telephone outreach was attempted for a scheduled appointment today.  Follow Up Plan:  Additional outreach attempts will be made to offer the patient care coordination information and services.   Encounter Outcome:  No Answer   Care Coordination Interventions:  No, not indicated    Christa See, MSW, [email or web address] Phone [phone] 11:05 AM

## 2022-10-19 ENCOUNTER — Ambulatory Visit: Payer: Medicare HMO | Attending: Critical Care Medicine

## 2022-10-19 VITALS — Ht 76.0 in | Wt 250.0 lb

## 2022-10-19 DIAGNOSIS — Z0000 Encounter for general adult medical examination without abnormal findings: Secondary | ICD-10-CM

## 2022-10-19 NOTE — Progress Notes (Signed)
I connected with Robert Chan today by telephone and verified that I am speaking with the correct person using two identifiers. Location patient: home Location provider: work Persons participating in the virtual visit: Robert Chan, Robert Durand LPN.   I discussed the limitations, risks, security and privacy concerns of performing an evaluation and management service by telephone and the availability of in person appointments. I also discussed with the patient that there may be a patient responsible charge related to this service. The patient expressed understanding and verbally consented to this telephonic visit.    Interactive audio and video telecommunications were attempted between this provider and patient, however failed, due to patient having technical difficulties OR patient did not have access to video capability.  We continued and completed visit with audio only.     Vital signs may be patient reported or missing.  Subjective:   Robert Chan is a 65 y.o. male who presents for Medicare Annual/Subsequent preventive examination.  Review of Systems     Cardiac Risk Factors include: advanced age (>60mn, >>67women);hypertension;male gender;obesity (BMI >30kg/m2)     Objective:    Today's Vitals   10/19/22 1446  Weight: 250 lb (113.4 kg)  Height: '6\' 4"'$  (1.93 m)   Body mass index is 30.43 kg/m.     10/19/2022    2:52 PM 09/07/2022    2:09 PM 08/30/2022    1:01 PM 08/11/2022   11:30 AM 06/09/2021    3:57 PM 04/15/2021    6:14 PM 04/12/2021    2:02 PM  Advanced Directives  Does Patient Have a Medical Advance Directive? No No No  No No No  Would patient like information on creating a medical advance directive?  No - Patient declined No - Patient declined  No - Patient declined       Information is confidential and restricted. Go to Review Flowsheets to unlock data.    Current Medications (verified) Outpatient Encounter Medications as of 10/19/2022  Medication  Sig   albuterol (VENTOLIN HFA) 108 (90 Base) MCG/ACT inhaler Inhale 1-2 puffs into the lungs every 6 (six) hours as needed for wheezing or shortness of breath.   ARIPiprazole (ABILIFY) 10 MG tablet Take 1 tablet (10 mg total) by mouth daily.   atorvastatin (LIPITOR) 80 MG tablet Take 1 tablet (80 mg total) by mouth daily.   diltiazem (CARDIZEM CD) 240 MG 24 hr capsule Take 1 capsule (240 mg total) by mouth daily.   doxepin (SINEQUAN) 50 MG capsule Take 1 capsule (50 mg total) by mouth at bedtime.   latanoprost (XALATAN) 0.005 % ophthalmic solution Place 1 drop into both eyes at bedtime.   naproxen (NAPROSYN) 375 MG tablet Take 1 tablet (375 mg total) by mouth 2 (two) times daily. (Patient not taking: Reported on 10/19/2022)   orphenadrine (NORFLEX) 100 MG tablet Take 1 tablet (100 mg total) by mouth 2 (two) times daily. (Patient not taking: Reported on 10/19/2022)   senna-docusate (SENOKOT-S) 8.6-50 MG tablet Take 2 tablets by mouth daily at 8 pm. (Patient not taking: Reported on 10/19/2022)   No facility-administered encounter medications on file as of 10/19/2022.    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   Depression    Elevated serum creatinine 08/28/2019   Homelessness 06/19/2020   Hypertension    NSTEMI (non-ST elevated myocardial infarction) [facility])    Status post incision and drainage 04/22/2021   Suicidal ideation 06/05/2020   Past Surgical History:  Procedure Laterality Date   APPENDECTOMY     EYE SURGERY     LEFT HEART CATH AND CORONARY ANGIOGRAPHY N/A 06/05/2020   Procedure: LEFT HEART CATH AND CORONARY ANGIOGRAPHY;   Surgeon: Nigel Mormon, MD;  Location: [facility];  Service: Cardiovascular;  Laterality: N/A;   PROSTATE SURGERY     Family History  Problem Relation Age of Onset   Hypertension Mother    Diabetes Mother    Hypertension Father    Hypertension Sister    Social History   Socioeconomic History   Marital status: Single    Spouse name: Not on file   Number of children: Not on file   Years of education: Not on file   Highest education level: Not on file  Occupational History   Not on file  Tobacco Use   Smoking status: Former    Types: Cigarettes    Quit date: 12/25/2020    Years since quitting: 1.8   Smokeless tobacco: Never   Tobacco comments:    smokes 3-4 cigarettes/day  Vaping Use   Vaping Use: Never used  Substance and Sexual Activity   Alcohol use: Not Currently    Comment: drinks 2x/wk, 2-3 40 oz drinks, last drink Friday   Drug use: Not Currently    Types: Cocaine    Comment: cocaine use once a month, last use Friday    Sexual activity: Not Currently  Other Topics Concern   Not on file  Social History Narrative   His mother passed at age 18 (when he was 29 years old) and his father raised him and his siblings   His parents were pastors as a lot of his sisters and other family members are today   There was a total of 13 children in his family Had 40 sisters, one sister deceased , Had 3 brothers, all brothers deceased   He is the youngest of the 28 and was "always in trouble in my younger years"   Family still live in [place], [place], some in [place], [place]   Has a Daughter and son with a total of 10 grandchildren (88 grand daughters local and 67 grandsons)    Values family   Married twice, present wife incarcerated   Social Determinants of Health   Financial Resource Strain: [place]  (10/19/2022)   Overall Financial Resource Strain (CARDIA)    Difficulty of Paying Living Expenses: Not hard at all  Food Insecurity: No Food Insecurity  (10/19/2022)   Hunger Vital Sign    Worried About Running Out of Food in the Last Year: Never true    [place] in the Last Year: Never true  Transportation Needs: No Transportation Needs (10/19/2022)   PRAPARE - Hydrologist (Medical): No    Lack of Transportation (Non-Medical): No  Physical Activity: Inactive (10/19/2022)   Exercise Vital Sign    Days of Exercise per Week: 0 days    Minutes of Exercise per Session: 0 min  Stress: No Stress Concern Present (10/19/2022)   [place]    Feeling of  Stress : Not at all  Social Connections: Moderately Integrated (03/31/2022)   Social Connection and Isolation Panel [NHANES]    Frequency of Communication with Friends and Family: Three times a week    Frequency of Social Gatherings with Friends and Family: Three times a week    Attends Religious Services: 1 to 4 times per year    Active Member of Clubs or Organizations: Yes    Attends Archivist Meetings: 1 to 4 times per year    Marital Status: Never married    Tobacco Counseling Counseling given: Not Answered Tobacco comments: smokes 3-4 cigarettes/day   Clinical Intake:  Pre-visit preparation completed: Yes  Pain : No/denies pain     Nutritional Status: BMI > 30  Obese Nutritional Risks: None Diabetes: No  How often do you need to have someone help you when you read instructions, pamphlets, or other written materials from your doctor or pharmacy?: 1 - Never  Diabetic? no  Interpreter Needed?: No  Information entered by :: NAllen LPN   Activities of Daily Living    10/19/2022    2:53 PM 08/11/2022   11:30 AM  In your present state of health, do you have any difficulty performing the following activities:  Hearing? 0   Vision? 0   Difficulty concentrating or making decisions? 1   Walking or climbing stairs? 0   Dressing or bathing? 0   Doing errands,  shopping? 0   Preparing Food and eating ? N   Using the Toilet? N   In the past six months, have you accidently leaked urine? N   Do you have problems with loss of bowel control? N   Managing your Medications? N   Managing your Finances? N   Housekeeping or managing your Housekeeping? N      Information is confidential and restricted. Go to Review Flowsheets to unlock data.    Patient Care Team: Elsie Stain, MD as PCP - General (Pulmonary Disease) Sueanne Margarita, MD as PCP - Cardiology (Cardiology) Pa, [place], Dorathy Daft, DPM as Consulting Physician (Podiatry) System, Provider Not In as Physician Assistant (Dermatology) Marzetta Board, DPM as Consulting Physician (Podiatry)  Indicate any recent Medical Services you may have received from other than [facility] providers in the past year (date may be approximate).     Assessment:   This is a routine wellness examination for [place].  Hearing/Vision screen Vision Screening - Comments:: Regular eye exams,   Dietary issues and exercise activities discussed: Current Exercise Habits: The patient does not participate in regular exercise at present   Goals Addressed             This Visit's Progress    Patient Stated       10/19/2022, wants to go to the gym more       Depression Screen    10/19/2022    2:53 PM 05/11/2022    2:30 PM 03/31/2022    2:15 PM 01/14/2022    5:04 PM 01/11/2022   11:29 AM 09/28/2021    2:57 PM 09/28/2021    2:27 PM  PHQ 2/9 Scores  PHQ - 2 Score 0 2 0 '1 2 1 1  '$ PHQ- 9 Score  '5   8 11 12    '$ Fall Risk    10/19/2022    2:53 PM 05/11/2022    2:30 PM 03/31/2022    2:09 PM 09/28/2021    2:27 PM 09/07/2021    4:14  PM  [place] in the past year? 0 0 0 0 0  Number falls in past yr: 0 0 0 0 0  Injury with Fall? 0 0 0 0 0  Risk for fall due to : Medication side effect No Fall Risks Impaired mobility No Fall Risks   Follow up Falls prevention discussed;Education  provided;Falls evaluation completed  Falls evaluation completed  Falls evaluation completed    FALL RISK PREVENTION PERTAINING TO THE HOME:  Any stairs in or around the home? No  If so, are there any without handrails? N/a Home free of loose throw rugs in walkways, pet beds, electrical cords, etc? Yes  Adequate lighting in your home to reduce risk of falls? Yes   ASSISTIVE DEVICES UTILIZED TO PREVENT FALLS:  Life alert? No  Use of a cane, walker or w/c? No  Grab bars in the bathroom? Yes  Shower chair or bench in shower? No  Elevated toilet seat or a handicapped toilet? No   TIMED UP AND GO:  Was the test performed? No .      Cognitive Function:        10/19/2022    2:54 PM  6CIT Screen  What Year? 0 points  What month? 0 points  What time? 0 points  Count back from 20 0 points  Months in reverse 0 points  Repeat phrase 2 points  Total Score 2 points    Immunizations Immunization History  Administered Date(s) Administered   Fluad Quad(high Dose 65+) 09/28/2021   Alphonsa Overall (J&J) SARS-COV-2 Vaccination 08/13/2020   Moderna SARS-COV2 Booster Vaccination 10/23/2020   PNEUMOCOCCAL CONJUGATE-20 01/11/2022    TDAP status: Due, Education has been provided regarding the importance of this vaccine. Advised may receive this vaccine at local pharmacy or Health Dept. Aware to provide a copy of the vaccination record if obtained from local pharmacy or Health Dept. Verbalized acceptance and understanding.  Flu Vaccine status: Due, Education has been provided regarding the importance of this vaccine. Advised may receive this vaccine at local pharmacy or Health Dept. Aware to provide a copy of the vaccination record if obtained from local pharmacy or Health Dept. Verbalized acceptance and understanding.  Pneumococcal vaccine status: Up to date  Covid-19 vaccine status: Completed vaccines  Qualifies for Shingles Vaccine? Yes   Zostavax completed No   Shingrix Completed?: No.     Education has been provided regarding the importance of this vaccine. Patient has been advised to call insurance company to determine out of pocket expense if they have not yet received this vaccine. Advised may also receive vaccine at local pharmacy or Health Dept. Verbalized acceptance and understanding.  Screening Tests Health Maintenance  Topic Date Due   Medicare Annual Wellness (AWV)  Never done   DTaP/Tdap/Td (1 - Tdap) Never done   Fecal DNA (Cologuard)  Never done   Zoster Vaccines- Shingrix (1 of 2) Never done   INFLUENZA VACCINE  06/01/2022   Pneumonia Vaccine 17+ Years old  Completed   Hepatitis C Screening  Completed   HPV VACCINES  Aged Out   COLON CANCER SCREENING ANNUAL FOBT  Discontinued   COVID-19 Vaccine  Discontinued    Health Maintenance  Health Maintenance Due  Topic Date Due   Medicare Annual Wellness (AWV)  Never done   DTaP/Tdap/Td (1 - Tdap) Never done   Fecal DNA (Cologuard)  Never done   Zoster Vaccines- Shingrix (1 of 2) Never done   INFLUENZA VACCINE  06/01/2022  Colorectal cancer screening: due   Lung Cancer Screening: (Low Dose CT Chest recommended if Age 60-80 years, 30 pack-year currently smoking OR have quit w/in 15years.) does not qualify.   Lung Cancer Screening Referral: no  Additional Screening:  Hepatitis C Screening: does qualify; Completed 01/22/2020  Vision Screening: Recommended annual ophthalmology exams for early detection of glaucoma and other disorders of the eye. Is the patient up to date with their annual eye exam?  Yes  Who is the provider or what is the name of the office in which the patient attends annual eye exams? Can't remember name If pt is not established with a provider, would they like to be referred to a provider to establish care? No .   Dental Screening: Recommended annual dental exams for proper oral hygiene  Community Resource Referral / Chronic Care Management: CRR required this visit?  No   CCM  required this visit?  No      Plan:     I have personally reviewed and noted the following in the patient's chart:   Medical and social history Use of alcohol, tobacco or illicit drugs  Current medications and supplements including opioid prescriptions. Patient is not currently taking opioid prescriptions. Functional ability and status Nutritional status Physical activity Advanced directives List of other physicians Hospitalizations, surgeries, and ER visits in previous 12 months Vitals Screenings to include cognitive, depression, and falls Referrals and appointments  In addition, I have reviewed and discussed with patient certain preventive protocols, quality metrics, and best practice recommendations. A written personalized care plan for preventive services as well as general preventive health recommendations were provided to patient.     Kellie Simmering, LPN   [id number]/08/[id number]   Nurse Notes: none  Due to this being a virtual visit, the after visit summary with patients personalized plan was offered to patient via mail or my-chart.  to pick up at office at next visit

## 2022-10-19 NOTE — Patient Instructions (Signed)
Robert Chan , Thank you for taking time to come for your Medicare Wellness Visit. I appreciate your ongoing commitment to your health goals. Please review the following plan we discussed and let me know if I can assist you in the future.   These are the goals we discussed:  Goals       Obtain Supportive Resources/Managment of MH Conditions      Care Coordination Interventions: Active listening / Reflection utilized  Emotional Support Provided Participation in counseling encouraged  Verbalization of feelings encouraged  LCSW reviewed hospital admissions/ED visits with patient. States he was concerned about chest pain in October and kidney pain in November. During visit, they cleared him for any kidney concerns and felt he may have pulled a muscle resulting in a prescription for muscle relaxer. Pt shared that his pain is managed and he has minimized lifting weights to prevent injury Patient reports recent inpatient admission in [facility] hospital for irritability and anger Patient was successful in identifying triggers to difficulty managing symptoms. He reports having a f/up appt with an outpatient mental health provider scheduled; however, was unable to provide details to LCSW LCSW provided crisis intervention resources, noting pt denies current SI/HI. Strongly encouraged pt to follow up with appts and comply with prescribed medications to manage symptoms Reviewed upcoming appts. Provided # for GI appt and encouraged him to call LCSW collaborated with [facility], [place], about pt hx. Strategies to strengthen support system discussed Encouraged Patient to schedule f/up appt with PCP        Patient Stated      10/19/2022, wants to go to the gym more      Weight (lb) < 10 lb (4.5 kg) (pt-stated)      (THN) Pt wants to lose weight and would like to start with a goal of -10# over next 3 months. Current wt is 290#.        This is a list of the screening recommended for you and due dates:   Health Maintenance  Topic Date Due   DTaP/Tdap/Td vaccine (1 - Tdap) Never done   Cologuard (Stool DNA test)  Never done   Zoster (Shingles) Vaccine (1 of 2) Never done   Flu Shot  06/01/2022   Medicare Annual Wellness Visit  10/20/2023   Pneumonia Vaccine  Completed   Hepatitis C Screening: USPSTF Recommendation to screen - Ages 18-79 yo.  Completed   HPV Vaccine  Aged Out   Stool Blood Test  Discontinued   COVID-19 Vaccine  Discontinued    Advanced directives: Advance directive discussed with you today.  Conditions/risks identified: none  Next appointment: Follow up in one year for your annual wellness visit.   Preventive Care 12 Years and Older, Male  Preventive care refers to lifestyle choices and visits with your health care provider that can promote health and wellness. What does preventive care include? A yearly physical exam. This is also called an annual well check. Dental exams once or twice a year. Routine eye exams. Ask your health care provider how often you should have your eyes checked. Personal lifestyle choices, including: Daily care of your teeth and gums. Regular physical activity. Eating a healthy diet. Avoiding tobacco and drug use. Limiting alcohol use. Practicing safe sex. Taking low doses of aspirin every day. Taking vitamin and mineral supplements as recommended by your health care provider. What happens during an annual well check? The services and screenings done by your health care provider during your annual well  check will depend on your age, overall health, lifestyle risk factors, and family history of disease. Counseling  Your health care provider may ask you questions about your: Alcohol use. Tobacco use. Drug use. Emotional well-being. Chan and relationship well-being. Sexual activity. Eating habits. History of falls. Memory and ability to understand (cognition). Work and work Statistician. Screening  You may have the following  tests or measurements: Height, weight, and BMI. Blood pressure. Lipid and cholesterol levels. These may be checked every 5 years, or more frequently if you are over 61 years old. Skin check. Lung cancer screening. You may have this screening every year starting at age 48 if you have a 30-pack-year history of smoking and currently smoke or have quit within the past 15 years. Fecal occult blood test (FOBT) of the stool. You may have this test every year starting at age 45. Flexible sigmoidoscopy or colonoscopy. You may have a sigmoidoscopy every 5 years or a colonoscopy every 10 years starting at age 53. Prostate cancer screening. Recommendations will vary depending on your family history and other risks. Hepatitis C blood test. Hepatitis B blood test. Sexually transmitted disease (STD) testing. Diabetes screening. This is done by checking your blood sugar (glucose) after you have not eaten for a while (fasting). You may have this done every 1-3 years. Abdominal aortic aneurysm (AAA) screening. You may need this if you are a current or former smoker. Osteoporosis. You may be screened starting at age 79 if you are at high risk. Talk with your health care provider about your test results, treatment options, and if necessary, the need for more tests. Vaccines  Your health care provider may recommend certain vaccines, such as: Influenza vaccine. This is recommended every year. Tetanus, diphtheria, and acellular pertussis (Tdap, Td) vaccine. You may need a Td booster every 10 years. Zoster vaccine. You may need this after age 71. Pneumococcal 13-valent conjugate (PCV13) vaccine. One dose is recommended after age 98. Pneumococcal polysaccharide (PPSV23) vaccine. One dose is recommended after age 38. Talk to your health care provider about which screenings and vaccines you need and how often you need them. This information is not intended to replace advice given to you by your health care provider.  Make sure you discuss any questions you have with your health care provider. Document Released: 11/14/2015 Document Revised: 07/07/2016 Document Reviewed: 08/19/2015 Elsevier Interactive Patient Education  2017 Robert Chan Falls can cause injuries. They can happen to people of all ages. There are many things you can do to make your Chan safe and to help prevent falls. What can I do on the outside of my Chan? Regularly fix the edges of walkways and driveways and fix any cracks. Remove anything that might make you trip as you walk through a door, such as a raised step or threshold. Trim any bushes or trees on the path to your Chan. Use bright outdoor lighting. Clear any walking paths of anything that might make someone trip, such as rocks or tools. Regularly check to see if handrails are loose or broken. Make sure that both sides of any steps have handrails. Any raised decks and porches should have guardrails on the edges. Have any leaves, snow, or ice cleared regularly. Use sand or salt on walking paths during winter. Clean up any spills in your garage right away. This includes oil or grease spills. What can I do in the bathroom? Use night lights. Install grab bars by the toilet and  in the tub and shower. Do not use towel bars as grab bars. Use non-skid mats or decals in the tub or shower. If you need to sit down in the shower, use a plastic, non-slip stool. Keep the floor dry. Clean up any water that spills on the floor as soon as it happens. Remove soap buildup in the tub or shower regularly. Attach bath mats securely with double-sided non-slip rug tape. Do not have throw rugs and other things on the floor that can make you trip. What can I do in the bedroom? Use night lights. Make sure that you have a light by your bed that is easy to reach. Do not use any sheets or blankets that are too big for your bed. They should not hang down onto the floor. Have a  firm chair that has side arms. You can use this for support while you get dressed. Do not have throw rugs and other things on the floor that can make you trip. What can I do in the kitchen? Clean up any spills right away. Avoid walking on wet floors. Keep items that you use a lot in easy-to-reach places. If you need to reach something above you, use a strong step stool that has a grab bar. Keep electrical cords out of the way. Do not use floor polish or wax that makes floors slippery. If you must use wax, use non-skid floor wax. Do not have throw rugs and other things on the floor that can make you trip. What can I do with my stairs? Do not leave any items on the stairs. Make sure that there are handrails on both sides of the stairs and use them. Fix handrails that are broken or loose. Make sure that handrails are as long as the stairways. Check any carpeting to make sure that it is firmly attached to the stairs. Fix any carpet that is loose or worn. Avoid having throw rugs at the top or bottom of the stairs. If you do have throw rugs, attach them to the floor with carpet tape. Make sure that you have a light switch at the top of the stairs and the bottom of the stairs. If you do not have them, ask someone to add them for you. What else can I do to help prevent falls? Wear shoes that: Do not have high heels. Have rubber bottoms. Are comfortable and fit you well. Are closed at the toe. Do not wear sandals. If you use a stepladder: Make sure that it is fully opened. Do not climb a closed stepladder. Make sure that both sides of the stepladder are locked into place. Ask someone to hold it for you, if possible. Clearly mark and make sure that you can see: Any grab bars or handrails. First and last steps. Where the edge of each step is. Use tools that help you move around (mobility aids) if they are needed. These include: Canes. Walkers. Scooters. Crutches. Turn on the lights when you  go into a dark area. Replace any light bulbs as soon as they burn out. Set up your furniture so you have a clear path. Avoid moving your furniture around. If any of your floors are uneven, fix them. If there are any pets around you, be aware of where they are. Review your medicines with your doctor. Some medicines can make you feel dizzy. This can increase your chance of falling. Ask your doctor what other things that you can do to help prevent falls. This  information is not intended to replace advice given to you by your health care provider. Make sure you discuss any questions you have with your health care provider. Document Released: 08/14/2009 Document Revised: 03/25/2016 Document Reviewed: 11/22/2014 Elsevier Interactive Patient Education  2017 Robert Chan.

## 2022-10-20 ENCOUNTER — Telehealth: Payer: Self-pay | Admitting: Critical Care Medicine

## 2022-10-20 DIAGNOSIS — J410 Simple chronic bronchitis: Secondary | ICD-10-CM

## 2022-10-20 MED ORDER — LATANOPROST 0.005 % OP SOLN: 1.0000 [drp] | Freq: Every day | OPHTHALMIC | 1 refills | Status: DC

## 2022-10-20 MED ORDER — BUDESONIDE-FORMOTEROL FUMARATE 160-4.5 MCG/ACT IN AERO: 2.0000 | INHALATION_SPRAY | Freq: Two times a day (BID) | RESPIRATORY_TRACT | 3 refills | Status: DC

## 2022-10-20 MED ORDER — ALBUTEROL SULFATE HFA 108 (90 BASE) MCG/ACT IN AERS: 1.0000 | INHALATION_SPRAY | Freq: Four times a day (QID) | RESPIRATORY_TRACT | 2 refills | Status: DC | PRN

## 2022-10-20 NOTE — Telephone Encounter (Signed)
-----   Message from Kellie Simmering, LPN sent at 43/27/6147  3:01 PM EST ----- Regarding: refills Hello Dr. Joya Gaskins, Mr. Bolds states that he needs a refill of latanoprost and the albuterol inhaler. He is also requesting a refill of symbicort, but I do not see that on his list.

## 2022-10-20 NOTE — Congregational Nurse Program (Unsigned)
  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 Discussed blood glucose management. 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

## 2022-10-20 NOTE — Telephone Encounter (Signed)
Refill sent to his pharmacy.

## 2022-11-04 DIAGNOSIS — I2510 Atherosclerotic heart disease of native coronary artery without angina pectoris: Secondary | ICD-10-CM | POA: Diagnosis not present

## 2022-11-04 DIAGNOSIS — Z7951 Long term (current) use of inhaled steroids: Secondary | ICD-10-CM | POA: Diagnosis not present

## 2022-11-04 DIAGNOSIS — J4489 Other specified chronic obstructive pulmonary disease: Secondary | ICD-10-CM | POA: Diagnosis not present

## 2022-11-04 DIAGNOSIS — I252 Old myocardial infarction: Secondary | ICD-10-CM | POA: Diagnosis not present

## 2022-11-04 DIAGNOSIS — I10 Essential (primary) hypertension: Secondary | ICD-10-CM | POA: Diagnosis not present

## 2022-11-04 DIAGNOSIS — R69 Illness, unspecified: Secondary | ICD-10-CM | POA: Diagnosis not present

## 2022-11-04 DIAGNOSIS — H259 Unspecified age-related cataract: Secondary | ICD-10-CM | POA: Diagnosis not present

## 2022-11-04 DIAGNOSIS — F1421 Cocaine dependence, in remission: Secondary | ICD-10-CM | POA: Diagnosis not present

## 2022-11-04 DIAGNOSIS — F319 Bipolar disorder, unspecified: Secondary | ICD-10-CM | POA: Diagnosis not present

## 2022-11-04 DIAGNOSIS — F1221 Cannabis dependence, in remission: Secondary | ICD-10-CM | POA: Diagnosis not present

## 2022-11-04 DIAGNOSIS — E785 Hyperlipidemia, unspecified: Secondary | ICD-10-CM | POA: Diagnosis not present

## 2022-11-04 DIAGNOSIS — H409 Unspecified glaucoma: Secondary | ICD-10-CM | POA: Diagnosis not present

## 2022-11-04 DIAGNOSIS — F17200 Nicotine dependence, unspecified, uncomplicated: Secondary | ICD-10-CM | POA: Diagnosis not present

## 2022-11-04 DIAGNOSIS — Z008 Encounter for other general examination: Secondary | ICD-10-CM | POA: Diagnosis not present

## 2022-11-08 ENCOUNTER — Emergency Department (HOSPITAL_COMMUNITY)
Admission: EM | Admit: 2022-11-08 | Discharge: 2022-11-09 | Disposition: A | Discharge status: Other Acute Inpatient Hospital | Payer: Medicare HMO | Attending: Emergency Medicine | Admitting: Emergency Medicine

## 2022-11-08 ENCOUNTER — Emergency Department (HOSPITAL_COMMUNITY): Payer: Medicare HMO

## 2022-11-08 ENCOUNTER — Other Ambulatory Visit: Payer: Self-pay

## 2022-11-08 ENCOUNTER — Encounter (HOSPITAL_COMMUNITY): Payer: Self-pay

## 2022-11-08 DIAGNOSIS — I129 Hypertensive chronic kidney disease with stage 1 through stage 4 chronic kidney disease, or unspecified chronic kidney disease: Secondary | ICD-10-CM | POA: Insufficient documentation

## 2022-11-08 DIAGNOSIS — Z79899 Other long term (current) drug therapy: Secondary | ICD-10-CM | POA: Diagnosis not present

## 2022-11-08 DIAGNOSIS — R0789 Other chest pain: Secondary | ICD-10-CM | POA: Insufficient documentation

## 2022-11-08 DIAGNOSIS — F259 Schizoaffective disorder, unspecified: Secondary | ICD-10-CM | POA: Diagnosis not present

## 2022-11-08 DIAGNOSIS — F332 Major depressive disorder, recurrent severe without psychotic features: Secondary | ICD-10-CM | POA: Diagnosis present

## 2022-11-08 DIAGNOSIS — F17210 Nicotine dependence, cigarettes, uncomplicated: Secondary | ICD-10-CM | POA: Insufficient documentation

## 2022-11-08 DIAGNOSIS — R45851 Suicidal ideations: Secondary | ICD-10-CM | POA: Diagnosis not present

## 2022-11-08 DIAGNOSIS — Z20822 Contact with and (suspected) exposure to covid-19: Secondary | ICD-10-CM | POA: Diagnosis not present

## 2022-11-08 DIAGNOSIS — J45909 Unspecified asthma, uncomplicated: Secondary | ICD-10-CM | POA: Insufficient documentation

## 2022-11-08 DIAGNOSIS — I2510 Atherosclerotic heart disease of native coronary artery without angina pectoris: Secondary | ICD-10-CM | POA: Insufficient documentation

## 2022-11-08 DIAGNOSIS — N189 Chronic kidney disease, unspecified: Secondary | ICD-10-CM | POA: Diagnosis not present

## 2022-11-08 DIAGNOSIS — Z8659 Personal history of other mental and behavioral disorders: Secondary | ICD-10-CM | POA: Diagnosis not present

## 2022-11-08 DIAGNOSIS — F1411 Cocaine abuse, in remission: Secondary | ICD-10-CM

## 2022-11-08 DIAGNOSIS — F32A Depression, unspecified: Secondary | ICD-10-CM

## 2022-11-08 DIAGNOSIS — J449 Chronic obstructive pulmonary disease, unspecified: Secondary | ICD-10-CM | POA: Insufficient documentation

## 2022-11-08 DIAGNOSIS — R079 Chest pain, unspecified: Secondary | ICD-10-CM

## 2022-11-08 DIAGNOSIS — R69 Illness, unspecified: Secondary | ICD-10-CM | POA: Diagnosis not present

## 2022-11-08 DIAGNOSIS — Z5900 Homelessness unspecified: Secondary | ICD-10-CM | POA: Diagnosis not present

## 2022-11-08 DIAGNOSIS — F1410 Cocaine abuse, uncomplicated: Secondary | ICD-10-CM | POA: Diagnosis not present

## 2022-11-08 HISTORY — DX: Schizoaffective disorder, unspecified: F25.9

## 2022-11-08 LAB — CBC
HCT: 43.3 % (ref 39.0–52.0)
Hemoglobin: 13.4 g/dL (ref 13.0–17.0)
MCH: 30.3 pg (ref 26.0–34.0)
MCHC: 30.9 g/dL (ref 30.0–36.0)
MCV: 98 fL (ref 80.0–100.0)
Platelets: 260 10*3/uL (ref 150–400)
RBC: 4.42 MIL/uL (ref 4.22–5.81)
RDW: 12.3 % (ref 11.5–15.5)
WBC: 8.5 10*3/uL (ref 4.0–10.5)
nRBC: 0 % (ref 0.0–0.2)

## 2022-11-08 LAB — COMPREHENSIVE METABOLIC PANEL
ALT: 21 U/L (ref 0–44)
AST: 29 U/L (ref 15–41)
Albumin: 3.7 g/dL (ref 3.5–5.0)
Alkaline Phosphatase: 66 U/L (ref 38–126)
Anion gap: 7 (ref 5–15)
BUN: 12 mg/dL (ref 8–23)
CO2: 26 mmol/L (ref 22–32)
Calcium: 9.2 mg/dL (ref 8.9–10.3)
Chloride: 108 mmol/L (ref 98–111)
Creatinine, Ser: 1.46 mg/dL — ABNORMAL HIGH (ref 0.61–1.24)
GFR, Estimated: 52 mL/min — ABNORMAL LOW (ref >60)
Glucose, Bld: 100 mg/dL — ABNORMAL HIGH (ref 70–99)
Potassium: 4 mmol/L (ref 3.5–5.1)
Sodium: 141 mmol/L (ref 135–145)
Total Bilirubin: 0.3 mg/dL (ref 0.3–1.2)
Total Protein: 7.4 g/dL (ref 6.5–8.1)

## 2022-11-08 LAB — ETHANOL: Alcohol, Ethyl (B): <10 mg/dL (ref <10)

## 2022-11-08 LAB — RAPID URINE DRUG SCREEN, HOSP PERFORMED
Amphetamines: NOT DETECTED
Barbiturates: NOT DETECTED
Benzodiazepines: NOT DETECTED
Cocaine: POSITIVE — AB
Opiates: NOT DETECTED
Tetrahydrocannabinol: NOT DETECTED

## 2022-11-08 LAB — TROPONIN I (HIGH SENSITIVITY)
Troponin I (High Sensitivity): 8 ng/L (ref <18)
Troponin I (High Sensitivity): 6 ng/L (ref <18)

## 2022-11-08 LAB — ACETAMINOPHEN LEVEL: Acetaminophen (Tylenol), Serum: <10 ug/mL — ABNORMAL LOW (ref 10–30)

## 2022-11-08 LAB — RESP PANEL BY RT-PCR (RSV, FLU A&B, COVID)  RVPGX2
Influenza A by PCR: NEGATIVE
Influenza B by PCR: NEGATIVE
Resp Syncytial Virus by PCR: NEGATIVE
SARS Coronavirus 2 by RT PCR: NEGATIVE

## 2022-11-08 LAB — SALICYLATE LEVEL: Salicylate Lvl: <7 mg/dL — ABNORMAL LOW (ref 7.0–30.0)

## 2022-11-08 LAB — D-DIMER, QUANTITATIVE: D-Dimer, Quant: 0.43 ug/mL-FEU (ref 0.00–0.50)

## 2022-11-08 MED ORDER — DILTIAZEM HCL ER COATED BEADS 240 MG PO CP24
240.0000 mg | ORAL_CAPSULE | Freq: Every day | ORAL | Status: DC
  Administered 2022-11-09: 240 mg via ORAL
  Filled 2022-11-08: qty 1

## 2022-11-08 MED ORDER — ATORVASTATIN CALCIUM 40 MG PO TABS
80.0000 mg | ORAL_TABLET | Freq: Every day | ORAL | Status: DC
  Administered 2022-11-09: 80 mg via ORAL
  Filled 2022-11-08: qty 2

## 2022-11-08 MED ORDER — ARIPIPRAZOLE 10 MG PO TABS
10.0000 mg | ORAL_TABLET | Freq: Every day | ORAL | Status: DC
  Administered 2022-11-09: 10 mg via ORAL
  Filled 2022-11-08: qty 1

## 2022-11-08 MED ORDER — ALBUTEROL SULFATE (2.5 MG/3ML) 0.083% IN NEBU: 3.0000 mL | INHALATION_SOLUTION | Freq: Four times a day (QID) | RESPIRATORY_TRACT | Status: DC | PRN

## 2022-11-08 MED ORDER — MOMETASONE FURO-FORMOTEROL FUM 200-5 MCG/ACT IN AERO
2.0000 | INHALATION_SPRAY | Freq: Two times a day (BID) | RESPIRATORY_TRACT | Status: DC
  Administered 2022-11-09: 2 via RESPIRATORY_TRACT
  Filled 2022-11-08: qty 8.8

## 2022-11-08 MED ORDER — LATANOPROST 0.005 % OP SOLN
1.0000 [drp] | Freq: Every day | OPHTHALMIC | Status: DC
  Administered 2022-11-09: 1 [drp] via OPHTHALMIC
  Filled 2022-11-08: qty 2.5

## 2022-11-08 MED ORDER — ALBUTEROL SULFATE HFA 108 (90 BASE) MCG/ACT IN AERS
2.0000 | INHALATION_SPRAY | Freq: Once | RESPIRATORY_TRACT | Status: AC
  Administered 2022-11-08: 2 via RESPIRATORY_TRACT
  Filled 2022-11-08: qty 6.7

## 2022-11-08 MED ORDER — DOXEPIN HCL 50 MG PO CAPS
50.0000 mg | ORAL_CAPSULE | Freq: Every day | ORAL | Status: DC
  Administered 2022-11-08: 50 mg via ORAL
  Filled 2022-11-08 (×2): qty 1

## 2022-11-08 NOTE — Progress Notes (Signed)
Patient has been denied by [facility] due to no appropriate beds available. Patient meets [facility] inpatient criteria per Merlyn Lot, NP. Patient has been faxed out to the following facilities:   [facility]  [address], [place] [address] [phone]  [place], [place] [address] [phone]  [place]  [place], [place] [address] [place]  [facility]  [address], [place] [address] [phone] [facility]  [address] [place], [place] [address] [phone] [facility]  [place] [address] [phone] [facility]  [address] [place] [address] [phone] [facility]  [address], [place] [address] [phone]  [facility]  [address], [place] [address] [phone] [place]  [address] [place] [address] [phone] [place]  [address] [place] [address] [phone] [facility]  [place], [place] [address] [phone]  [facility]  [address], [place] [address] [place], [place] [address] [phone] [facility]  [address] [place] [address] [phone]    Mariea Clonts, MSW, LCSW-A  11:33 PM 11/08/2022
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
## 2022-11-08 NOTE — ED Notes (Signed)
Pt has safety sitter at bedside

## 2022-11-08 NOTE — ED Notes (Signed)
Pt is voluntary for now consent form attached to the clipboard in orange zone

## 2022-11-08 NOTE — ED Provider Triage Note (Signed)
Emergency Medicine Provider Triage Evaluation Note  Robert Chan , a 67 y.o. male  was evaluated in triage.  Pt complains of suicidal thoughts and chest pain.  Pt feels like he needs inpatient treatment   Review of Systems  Positive: Chest pain  Negative: fever  Physical Exam  BP 125/83   Pulse 69   Temp 98.3 F (36.8 C)   Resp (!) 21   Ht '6\' 4"'$  (1.93 m)   Wt 113.4 kg   SpO2 98%   BMI 30.43 kg/m  Gen:   Awake, no distress   Resp:  Normal effort  MSK:   Moves extremities without difficulty  Other:    Medical Decision Making  Medically screening exam initiated at 4:29 PM.  Appropriate orders placed.  Emmit Oriley was informed that the remainder of the evaluation will be completed by another provider, this initial triage assessment does not replace that evaluation, and the importance of remaining in the ED until their evaluation is complete.     Fransico Meadow, [place] 11/08/22 8250

## 2022-11-08 NOTE — ED Provider Notes (Signed)
[facility] EMERGENCY DEPARTMENT Provider Note   CSN: [id number] Arrival date & time: 11/08/22  1441     History Chief Complaint  Patient presents with   Psychiatric Evaluation   Chest Pain    Robert Chan is a 67 y.o. male.  67 year old male with a history of cocaine abuse and coronary vasospasm with nonobstructive CAD, CKD, COPD, depression, hypertension, and homelessness who presents to the emergency department with 2 days of shortness of breath, cough, and chest pain.  Patient reports for the past 2 days she has had congestion as well as a cough.  Says he has been taking his inhaler but has not been improving his symptoms as it typically does.  Says that he also started experiencing diffuse chest pressure that is difficult to characterize.  Reports that it is exertional.  Not pleuritic.  No lower extremity swelling.  Denies any recent recreational drug use or alcohol use.  No diaphoresis or vomiting.  Also states that prior to coming he was having thoughts of killing himself by taking too many pills because of family issues that he has been having at home.  No firearms at the house.  Did not want to further elaborate on this.       Home Medications Prior to Admission medications   Medication Sig Start Date End Date Taking? Authorizing Provider  albuterol (VENTOLIN HFA) 108 (90 Base) MCG/ACT inhaler Inhale 1-2 puffs into the lungs every 6 (six) hours as needed for wheezing or shortness of breath. 10/20/22  Yes Elsie Stain, MD  atorvastatin (LIPITOR) 80 MG tablet Take 1 tablet (80 mg total) by mouth daily. 08/18/22 08/19/23 Yes Clapacs, Madie Reno, MD  latanoprost (XALATAN) 0.005 % ophthalmic solution Place 1 drop into both eyes at bedtime. 10/20/22  Yes Elsie Stain, MD  ARIPiprazole (ABILIFY) 10 MG tablet Take 1 tablet (10 mg total) by mouth daily. Patient not taking: Reported on 11/08/2022 08/18/22   Clapacs, Madie Reno, MD  budesonide-formoterol [facility])  160-4.5 MCG/ACT inhaler Inhale 2 puffs into the lungs 2 (two) times daily. Patient not taking: Reported on 11/08/2022 10/20/22   Elsie Stain, MD  diltiazem (CARDIZEM CD) 240 MG 24 hr capsule Take 1 capsule (240 mg total) by mouth daily. Patient not taking: Reported on 11/08/2022 08/18/22   Clapacs, Madie Reno, MD  doxepin (SINEQUAN) 50 MG capsule Take 1 capsule (50 mg total) by mouth at bedtime. Patient not taking: Reported on 11/08/2022 08/18/22   Clapacs, Madie Reno, MD  orphenadrine (NORFLEX) 100 MG tablet Take 1 tablet (100 mg total) by mouth 2 (two) times daily. Patient not taking: Reported on 46/65/9935 70/1/77   Delora Fuel, MD  senna-docusate (SENOKOT-S) 8.6-50 MG tablet Take 2 tablets by mouth daily at 8 pm. Patient not taking: Reported on 10/19/2022 08/18/22   Clapacs, Madie Reno, MD      Allergies    Shellfish allergy and Shellfish-derived products    Review of Systems   Review of Systems  Physical Exam Updated Vital Signs BP 134/66 (BP Location: Left Arm)   Pulse 62   Temp 97.8 F (36.6 C) (Oral)   Resp 18   Ht '6\' 4"'$  (1.93 m)   Wt 113.4 kg   SpO2 98%   BMI 30.43 kg/m  Physical Exam Vitals and nursing note reviewed.  Constitutional:      General: He is not in acute distress.    Appearance: He is well-developed.     Comments: Wearing paper scrub bottoms  and scrub top from [place]:     Head: Normocephalic and atraumatic.     Right Ear: External ear normal.     Left Ear: External ear normal.     Nose: Nose normal.  Eyes:     Extraocular Movements: Extraocular movements intact.     Conjunctiva/sclera: Conjunctivae normal.     Pupils: Pupils are equal, round, and reactive to light.  Cardiovascular:     Rate and Rhythm: Normal rate and regular rhythm.     Heart sounds: Normal heart sounds.  Pulmonary:     Effort: Pulmonary effort is normal. No respiratory distress.     Breath sounds: Normal breath sounds.  Musculoskeletal:     Cervical back:  Normal range of motion and neck supple.     Right lower leg: No edema.     Left lower leg: No edema.  Skin:    General: Skin is warm and dry.  Neurological:     Mental Status: He is alert. Mental status is at baseline.  Psychiatric:        Mood and Affect: Mood normal.        Behavior: Behavior normal.     ED Results / Procedures / Treatments   Labs (all labs ordered are listed, but only abnormal results are displayed) Labs Reviewed  COMPREHENSIVE METABOLIC PANEL - Abnormal; Notable for the following components:      Result Value   Glucose, Bld 100 (*)    Creatinine, Ser 1.46 (*)    GFR, Estimated 52 (*)    All other components within normal limits  SALICYLATE LEVEL - Abnormal; Notable for the following components:   Salicylate Lvl <6.2 (*)    All other components within normal limits  ACETAMINOPHEN LEVEL - Abnormal; Notable for the following components:   Acetaminophen (Tylenol), Serum <10 (*)    All other components within normal limits  RAPID URINE DRUG SCREEN, HOSP PERFORMED - Abnormal; Notable for the following components:   Cocaine POSITIVE (*)    All other components within normal limits  RESP PANEL BY RT-PCR (RSV, FLU A&B, COVID)  RVPGX2  CBC  ETHANOL  D-DIMER, QUANTITATIVE  TROPONIN I (HIGH SENSITIVITY)  TROPONIN I (HIGH SENSITIVITY)    EKG EKG Interpretation  Date/Time:  Monday November 08 2022 15:48:46 EST Ventricular Rate:  70 PR Interval:  188 QRS Duration: 86 QT Interval:  420 QTC Calculation: 453 R Axis:   87 Text Interpretation: Sinus rhythm with occasional Premature ventricular complexes Low voltage QRS No significant change since last tracing When compared with ECG of 30-Aug-2022 12:47, PREVIOUS ECG IS PRESENT Confirmed by Blanchie Dessert [phone]) on 11/08/2022 4:39:21 PM  Radiology DG Chest 2 View  Result Date: 11/08/2022 CLINICAL DATA:  Chest pain EXAM: CHEST - 2 VIEW COMPARISON:  Chest x-ray 08/09/2022 FINDINGS: The heart size and mediastinal  contours are within normal limits. Both lungs are clear. The visualized skeletal structures are unremarkable. IMPRESSION: No active cardiopulmonary disease. Electronically Signed   By: Ronney Asters M.D.   On: 11/08/2022 17:05    Procedures Procedures   Medications Ordered in ED Medications  albuterol (PROVENTIL) (2.5 MG/3ML) 0.083% nebulizer solution 3 mL (has no administration in time range)  ARIPiprazole (ABILIFY) tablet 10 mg (has no administration in time range)  atorvastatin (LIPITOR) tablet 80 mg (has no administration in time range)  diltiazem (CARDIZEM CD) 24 hr capsule 240 mg (has no administration in time range)  doxepin (SINEQUAN) capsule 50  mg (50 mg Oral Given 11/08/22 2316)  latanoprost (XALATAN) 0.005 % ophthalmic solution 1 drop (has no administration in time range)  mometasone-formoterol (DULERA) 200-5 MCG/ACT inhaler 2 puff (has no administration in time range)  albuterol (VENTOLIN HFA) 108 (90 Base) MCG/ACT inhaler 2 puff (2 puffs Inhalation Given 11/08/22 1718)    ED Course/ Medical Decision Making/ A&P Clinical Course as of 11/09/22 0040  Mon Nov 08, 2022  1753 COCAINE(!): POSITIVE [RP]  Tue Nov 09, 2022  0037 TTS searching for placement for the patient and feels that he requires admission.  No current beds available at [facility].  Patient has been faxed out and will be evaluated for placement at [facility] in the morning. [RP]    Clinical Course User Index [RP] Fransico Meadow, MD                           Medical Decision Making Amount and/or Complexity of Data Reviewed Labs: ordered. Decision-making details documented in ED Course. Radiology: ordered.  Risk Prescription drug management.   Rahim Astorga is a 68 y.o. male with comorbidities that complicate the patient evaluation including coronary vasospasm with nonobstructive CAD, CKD, COPD, depression, hypertension, and homelessness who presents to the emergency department with 2 days of shortness of breath, cough,  and chest pain and SI   Initial Ddx:  COPD exacerbation, URI, MI, PE, SI  MDM:  Unclear what is causing the patient's symptoms but feel that it is likely a URI.  No significant wheezing to suggest COPD exacerbation but will trial albuterol and see if this improves the patient's symptoms.  Considered MI but patient denies any recent substance abuse that would predispose him to this.  PE also on the differential given his shortness of breath and cough so will obtain D-dimer at this time.  Will consult TTS regarding the patient's suicidal ideations.    Plan:  Labs Urinalysis Urine drug screen Troponin D-dimer COVID and flu EKG Chest x-ray  ED Summary/Re-evaluation:  Patient underwent the above workup which showed urinalysis that reflects that he may have likely been using cocaine recently. This may have been the cause of his chest pain. Serial troponins and EKG were unremarkable.  COVID and flu and chest x-ray did not show any evidence of infection.  Patient remained stable while in the emergency department.  Was evaluated by psychiatry who feels that he should be admitted for his SI.  They are looking for placement now and patient is currently medically cleared.  This patient presents to the ED for concern of complaints listed in HPI, this involves an extensive number of treatment options, and is a complaint that carries with it a high risk of complications and morbidity. Disposition including potential need for admission considered.   Dispo: Per psychiatry  Records reviewed Outpatient Clinic Notes The following labs were independently interpreted: Serial Troponins and show no acute abnormality I independently reviewed the following imaging with scope of interpretation limited to determining acute life threatening conditions related to emergency care: Chest x-ray and agree with the radiologist interpretation with the following exceptions: none I personally reviewed and interpreted the pt's  EKG: see above for interpretation  I have reviewed the patients home medications and made adjustments as needed Consults: TTS Social Determinants of health:  Homelessness and cocaine abuse  Final Clinical Impression(s) / ED Diagnoses Final diagnoses:  Chest pain, unspecified type  Suicidal ideation  Depression, unspecified depression type  History of cocaine  abuse [facility])    Rx / DC Orders ED Discharge Orders     None         Fransico Meadow, MD 11/09/22 0040

## 2022-11-08 NOTE — ED Notes (Signed)
This RN attempted to get Dimer and troponin with no success.  Phlebotomy to come to bedside and collect blood.  No new orders at this time.

## 2022-11-08 NOTE — ED Triage Notes (Signed)
  Pt c/o chest pain x 2 days that started while walking to the store. Pt describes the pain as a pressure and is located to the middle of the chest without radiation. Pt reports some worsening in his ShOB. Denies nausea or diaphoresis.    Pt also stating SI with the intent of "taking pills." Pt stating he is wanting to talk to someone about this.

## 2022-11-09 DIAGNOSIS — Z5900 Homelessness unspecified: Secondary | ICD-10-CM | POA: Diagnosis not present

## 2022-11-09 DIAGNOSIS — F259 Schizoaffective disorder, unspecified: Secondary | ICD-10-CM | POA: Diagnosis not present

## 2022-11-09 DIAGNOSIS — Z20822 Contact with and (suspected) exposure to covid-19: Secondary | ICD-10-CM | POA: Diagnosis not present

## 2022-11-09 DIAGNOSIS — J45909 Unspecified asthma, uncomplicated: Secondary | ICD-10-CM | POA: Diagnosis not present

## 2022-11-09 DIAGNOSIS — R69 Illness, unspecified: Secondary | ICD-10-CM | POA: Diagnosis not present

## 2022-11-09 DIAGNOSIS — F17210 Nicotine dependence, cigarettes, uncomplicated: Secondary | ICD-10-CM | POA: Diagnosis not present

## 2022-11-09 DIAGNOSIS — R45851 Suicidal ideations: Secondary | ICD-10-CM | POA: Diagnosis not present

## 2022-11-09 DIAGNOSIS — I10 Essential (primary) hypertension: Secondary | ICD-10-CM | POA: Diagnosis not present

## 2022-11-09 DIAGNOSIS — Z8659 Personal history of other mental and behavioral disorders: Secondary | ICD-10-CM | POA: Diagnosis not present

## 2022-11-09 DIAGNOSIS — I2510 Atherosclerotic heart disease of native coronary artery without angina pectoris: Secondary | ICD-10-CM | POA: Diagnosis not present

## 2022-11-09 DIAGNOSIS — F411 Generalized anxiety disorder: Secondary | ICD-10-CM | POA: Diagnosis not present

## 2022-11-09 DIAGNOSIS — Z79899 Other long term (current) drug therapy: Secondary | ICD-10-CM | POA: Diagnosis not present

## 2022-11-09 DIAGNOSIS — H409 Unspecified glaucoma: Secondary | ICD-10-CM | POA: Diagnosis not present

## 2022-11-09 DIAGNOSIS — I129 Hypertensive chronic kidney disease with stage 1 through stage 4 chronic kidney disease, or unspecified chronic kidney disease: Secondary | ICD-10-CM | POA: Diagnosis not present

## 2022-11-09 DIAGNOSIS — Z91013 Allergy to seafood: Secondary | ICD-10-CM | POA: Diagnosis not present

## 2022-11-09 DIAGNOSIS — N189 Chronic kidney disease, unspecified: Secondary | ICD-10-CM | POA: Diagnosis not present

## 2022-11-09 DIAGNOSIS — R0789 Other chest pain: Secondary | ICD-10-CM | POA: Diagnosis not present

## 2022-11-09 DIAGNOSIS — G4700 Insomnia, unspecified: Secondary | ICD-10-CM | POA: Diagnosis not present

## 2022-11-09 DIAGNOSIS — J449 Chronic obstructive pulmonary disease, unspecified: Secondary | ICD-10-CM | POA: Diagnosis not present

## 2022-11-09 NOTE — Progress Notes (Signed)
LCSW Progress Note  [id number]   Robert Chan  11/09/2022  9:35 AM  Description:   Inpatient Psychiatric Referral  Patient was recommended inpatient per Merlyn Lot, NP. There are no available beds at [facility] unit. Patient was referred to the following facilities:   Destination  Service Provider Address Phone Fax  [facility]  [address] [place] [address] [phone]  [facility]  [address], [place] [address] [phone]  [facility]  [address], [place] [address] [phone] [facility]  [address] [place], [place] [address] [phone] [facility]  [place], [place] [address] [phone] [facility]  [address] [place] [address] [phone]  [facility]  [address] [place] [address] [phone] [place]  [address] [place] [address] [phone] [facility]  [place], [place] [address] [facility]  [facility]  [address], [place] [address] [phone] [facility]  [address], [place] [address] [phone] [facility]  [address], [place] [address] [phone]  [facility]  [address] [place], [place] [address] [place]  [facility]  [address], [place] [address] [phone]  [facility]  [place], [place] [address] [phone]  [facility]  [address] [place] [address] [phone] [facility]  [address] [place] [address] [phone] [address] [place] [address] [phone]      Situation ongoing, CSW to continue following and update chart as more information becomes available.      Denna Haggard, Latanya Presser  11/09/2022 9:35 AM

## 2022-11-09 NOTE — Progress Notes (Signed)
CSW received phone call from [place] [facility] requesting additional nursing and provider notes be faxed over. CSW faxed additional notes to [facility] admissions department. Pt is currently under review at [facility] for inpatient treatment. CSW will continue to assist and follow with placement.  Denna Haggard, [place]  11/09/2022 9:24 AM

## 2022-11-09 NOTE — Progress Notes (Signed)
Pt was accepted to [facility] 11/09/2022  Pt meets inpatient criteria per Merlyn Lot, NP  Attending Physician will be Vear Clock, MD  Report can be called to: [phone]  Bed is ready now  Care Team Notified: Merlyn Lot, NP, Gwenith Daily, RN, Carron Curie, RN, Raymar Patsey Berthold, RN, and Margaretmary Eddy, MD  Denna Haggard, LCSWA  11/09/2022 10:09 AM

## 2022-11-09 NOTE — ED Provider Notes (Signed)
Emergency Medicine Observation Re-evaluation Note  Robert Chan is a 67 y.o. male, seen on rounds today.  Pt initially presented to the ED for complaints of Psychiatric Evaluation and Chest Pain Currently, the patient is resting on bed.  Physical Exam  BP (!) 144/85 (BP Location: Left Arm)   Pulse 61   Temp 97.7 F (36.5 C) (Oral)   Resp 18   Ht '6\' 4"'$  (1.93 m)   Wt 113.4 kg   SpO2 97%   BMI 30.43 kg/m  Physical Exam General: Awake, alert, nondistressed Cardiac: Extremities well-perfused Lungs: Breathing is unlabored Psych: No agitation  ED Course / MDM  EKG:EKG Interpretation  Date/Time:  Monday November 08 2022 15:48:46 EST Ventricular Rate:  70 PR Interval:  188 QRS Duration: 86 QT Interval:  420 QTC Calculation: 453 R Axis:   87 Text Interpretation: Sinus rhythm with occasional Premature ventricular complexes Low voltage QRS No significant change since last tracing When compared with ECG of 30-Aug-2022 12:47, PREVIOUS ECG IS PRESENT Confirmed by Blanchie Dessert [phone]) on 11/08/2022 4:39:21 PM  I have reviewed the labs performed to date as well as medications administered while in observation.  Recent changes in the last 24 hours include presentation to the ED yesterday for shortness of breath, cough, chest pain.  He also endorsed suicidal ideation with plan.  Patient was medically cleared and evaluated by TTS.  He may criteria for inpatient admission.  He was accepted to [facility].  Plan  Current plan is for [facility] inpatient psychiatric care.    Godfrey Pick, MD 11/09/22 1159

## 2022-11-10 DIAGNOSIS — H409 Unspecified glaucoma: Secondary | ICD-10-CM | POA: Diagnosis not present

## 2022-11-10 DIAGNOSIS — J449 Chronic obstructive pulmonary disease, unspecified: Secondary | ICD-10-CM | POA: Diagnosis not present

## 2022-11-11 DIAGNOSIS — F329 Major depressive disorder, single episode, unspecified: Secondary | ICD-10-CM | POA: Diagnosis not present

## 2022-11-11 DIAGNOSIS — F259 Schizoaffective disorder, unspecified: Secondary | ICD-10-CM | POA: Diagnosis not present

## 2022-11-11 DIAGNOSIS — F411 Generalized anxiety disorder: Secondary | ICD-10-CM | POA: Diagnosis not present

## 2022-11-11 DIAGNOSIS — F1490 Cocaine use, unspecified, uncomplicated: Secondary | ICD-10-CM | POA: Diagnosis not present

## 2022-11-11 DIAGNOSIS — R69 Illness, unspecified: Secondary | ICD-10-CM | POA: Diagnosis not present

## 2022-11-12 DIAGNOSIS — F259 Schizoaffective disorder, unspecified: Secondary | ICD-10-CM | POA: Diagnosis not present

## 2022-11-12 DIAGNOSIS — F329 Major depressive disorder, single episode, unspecified: Secondary | ICD-10-CM | POA: Diagnosis not present

## 2022-11-12 DIAGNOSIS — R69 Illness, unspecified: Secondary | ICD-10-CM | POA: Diagnosis not present

## 2022-11-12 DIAGNOSIS — F411 Generalized anxiety disorder: Secondary | ICD-10-CM | POA: Diagnosis not present

## 2022-11-12 DIAGNOSIS — F1490 Cocaine use, unspecified, uncomplicated: Secondary | ICD-10-CM | POA: Diagnosis not present

## 2022-11-13 DIAGNOSIS — F411 Generalized anxiety disorder: Secondary | ICD-10-CM | POA: Diagnosis not present

## 2022-11-13 DIAGNOSIS — F329 Major depressive disorder, single episode, unspecified: Secondary | ICD-10-CM | POA: Diagnosis not present

## 2022-11-13 DIAGNOSIS — F259 Schizoaffective disorder, unspecified: Secondary | ICD-10-CM | POA: Diagnosis not present

## 2022-11-13 DIAGNOSIS — R69 Illness, unspecified: Secondary | ICD-10-CM | POA: Diagnosis not present

## 2022-11-13 DIAGNOSIS — F1490 Cocaine use, unspecified, uncomplicated: Secondary | ICD-10-CM | POA: Diagnosis not present

## 2022-11-14 DIAGNOSIS — F411 Generalized anxiety disorder: Secondary | ICD-10-CM | POA: Diagnosis not present

## 2022-11-14 DIAGNOSIS — F329 Major depressive disorder, single episode, unspecified: Secondary | ICD-10-CM | POA: Diagnosis not present

## 2022-11-14 DIAGNOSIS — F1490 Cocaine use, unspecified, uncomplicated: Secondary | ICD-10-CM | POA: Diagnosis not present

## 2022-11-14 DIAGNOSIS — R69 Illness, unspecified: Secondary | ICD-10-CM | POA: Diagnosis not present

## 2022-11-14 DIAGNOSIS — F259 Schizoaffective disorder, unspecified: Secondary | ICD-10-CM | POA: Diagnosis not present

## 2022-11-17 DIAGNOSIS — Z743 Need for continuous supervision: Secondary | ICD-10-CM | POA: Diagnosis not present

## 2022-11-17 DIAGNOSIS — R69 Illness, unspecified: Secondary | ICD-10-CM | POA: Diagnosis not present

## 2022-11-17 DIAGNOSIS — R0602 Shortness of breath: Secondary | ICD-10-CM | POA: Diagnosis not present

## 2022-11-17 DIAGNOSIS — R531 Weakness: Secondary | ICD-10-CM | POA: Diagnosis not present

## 2022-11-23 ENCOUNTER — Ambulatory Visit (INDEPENDENT_AMBULATORY_CARE_PROVIDER_SITE_OTHER): Payer: Medicare HMO | Admitting: Podiatry

## 2022-11-23 VITALS — BP 159/86

## 2022-11-23 DIAGNOSIS — Q828 Other specified congenital malformations of skin: Secondary | ICD-10-CM

## 2022-11-23 DIAGNOSIS — I739 Peripheral vascular disease, unspecified: Secondary | ICD-10-CM | POA: Diagnosis not present

## 2022-11-23 DIAGNOSIS — M79674 Pain in right toe(s): Secondary | ICD-10-CM | POA: Diagnosis not present

## 2022-11-23 DIAGNOSIS — M79675 Pain in left toe(s): Secondary | ICD-10-CM | POA: Diagnosis not present

## 2022-11-23 DIAGNOSIS — B351 Tinea unguium: Secondary | ICD-10-CM

## 2022-11-23 NOTE — Progress Notes (Unsigned)
  Subjective:  Patient ID: Robert Chan, male    DOB: 1955/12/04,  MRN: [id number]  Robert Chan presents to clinic today for {jgcomplaint:23593} No chief complaint on file.  New problem(s): None. {jgcomplaint:23593}  PCP is Elsie Stain, MD.  Allergies  Allergen Reactions   Shellfish Allergy Hives and Rash   Shellfish-Derived Products Rash    Other reaction(s): Other (See Comments)    Review of Systems: Negative except as noted in the HPI.  Objective: No changes noted in today's physical examination. There were no vitals filed for this visit.  Robert Chan is a pleasant 67 y.o. male {jgbodyhabitus:24098} AAO x 3.  Vascular Examination: CFT <3 seconds b/l LE. Palpable DP pulse(s) b/l LE. Nonpalpable PT pulse(s) b/l LE. Pedal hair absent. No pain with calf compression b/l. Trace edema noted BLE. No ischemia or gangrene noted b/l LE. No cyanosis or clubbing noted b/l LE.  Neurological Examination: Protective sensation intact 5/5 intact bilaterally with 10g monofilament b/l. Vibratory sensation intact b/l.  Dermatological Examination: Pedal skin is warm and supple b/l LE. No open wounds b/l LE. No interdigital macerations noted b/l LE. Toenails 1-5 bilaterally elongated, discolored, dystrophic, thickened, and crumbly with subungual debris and tenderness to dorsal palpation.   Porokeratotic lesion(s) submet head 5 b/l. No erythema, no edema, no drainage, no fluctuance.  Musculoskeletal Examination: Muscle strength 5/5 to all lower extremity muscle groups bilaterally. Plantarflexed metatarsal(s) 5th metatarsal head b/l lower extremities.  Radiographs: None Assessment/Plan: 1. Pain due to onychomycosis of toenails of both feet   2. Porokeratosis   3. PAD (peripheral artery disease) ([place])     No orders of the defined types were placed in this encounter.   None {Jgplan:23602::"-Patient/POA to call should there be question/concern in the interim."}   No follow-ups  on file.  Marzetta Board, DPM

## 2022-11-25 ENCOUNTER — Encounter: Payer: Self-pay | Admitting: Podiatry

## 2022-12-09 ENCOUNTER — Encounter: Payer: Self-pay | Admitting: Critical Care Medicine

## 2022-12-09 NOTE — Progress Notes (Signed)
Updated BP from recent home visit  128/74

## 2022-12-16 ENCOUNTER — Inpatient Hospital Stay: Payer: Medicare HMO | Admitting: Critical Care Medicine

## 2022-12-16 NOTE — Progress Notes (Deleted)
Established Patient Office Visit  Subjective:  Patient ID: Robert Chan, male    DOB: February 07, 1956  Age: 67 y.o. MRN: [id number]  CC:  No chief complaint on file.   HPI 12/2021 Robert Chan presents for primary care follow-up.  The patient has been compliant with the valsartan and amlodipine but ran out of the blood pressure medicine about a week ago.  On arrival blood pressure is actually quite good 138/80.  Patient has no other real complaints at this time.  He did not process his Cologuard kit was sent another when he has yet to reprocess it.  The initial kit did not have sufficient samples.  Patient does agree to receive a Prevnar 20 vaccine at this visit. The patient continues with following a smoke-free habit.  His diet still is variable.  He has no other real complaints at this visit.  05/2022 Patient is seen in return follow-up he has undergone cardiac evaluations and found to have mild coronary artery disease on coronary CT scanning but segmental wall motion abnormalities on echocardiogram.  He is still having active chest pain at times.  The pain goes to the left upper quadrant area as well as to the parasternal areas.  On arrival blood pressure is good 129/81.  He was not able to process the Cologuard he agrees to get a colonoscopy.  He does not have an established follow-up with cardiology after the imaging studies were done and April and March of this year.  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   Depression    Elevated serum creatinine 08/28/2019   Homelessness 06/19/2020   Hypertension    NSTEMI (non-ST elevated myocardial infarction) [facility])    Status post incision and drainage  04/22/2021   Suicidal ideation 06/05/2020    Past Surgical History:  Procedure Laterality Date   APPENDECTOMY     EYE SURGERY     LEFT HEART CATH AND CORONARY ANGIOGRAPHY N/A 06/05/2020   Procedure: LEFT HEART CATH AND CORONARY ANGIOGRAPHY;  Surgeon: Nigel Mormon, MD;  Location: [facility];  Service: Cardiovascular;  Laterality: N/A;   PROSTATE SURGERY      Family History  Problem Relation Age of Onset   Hypertension Mother    Diabetes Mother    Hypertension Father    Hypertension Sister     Social History   Socioeconomic History   Marital status: Single    Spouse name: Not on file   Number of children: Not on file   Years of education: Not on file   Highest education level: Not on file  Occupational History   Not on file  Tobacco Use   Smoking status: Some Days    Types: Cigarettes    Last attempt to quit: 12/25/2020    Years since quitting: 1.9   Smokeless tobacco: Never   Tobacco comments:    smokes 3-4 cigarettes/day  Vaping Use   Vaping Use: Never used  Substance and Sexual Activity   Alcohol use: Yes    Comment: drinks 2x/wk, 2-3 40 oz drinks, last drink Friday   Drug use: Not Currently    Types: Cocaine  Comment: cocaine use once a month, last use Friday    Sexual activity: Not Currently  Other Topics Concern   Not on file  Social History Narrative   His mother passed at age 1 (when he was 1 years old) and his father raised him and his siblings   His parents were pastors as a lot of his sisters and other family members are today   There was a total of 61 children in his family Had 53 sisters, one sister deceased , Had 3 brothers, all brothers deceased   He is the youngest of the 36 and was "always in trouble in my younger years"   Family still live in [place], [place], some in [place], [place]   Has a Daughter and son with a total of 10 grandchildren (43 grand daughters local and 70 grandsons)    Values family   Married twice, present wife  incarcerated   Social Determinants of Health   Financial Resource Strain: [place]  (10/19/2022)   Overall Financial Resource Strain (CARDIA)    Difficulty of Paying Living Expenses: Not hard at all  Food Insecurity: No Food Insecurity (10/19/2022)   Hunger Vital Sign    Worried About Running Out of Food in the Last Year: Never true    Micro in the Last Year: Never true  Transportation Needs: No Transportation Needs (10/19/2022)   PRAPARE - Hydrologist (Medical): No    Lack of Transportation (Non-Medical): No  Physical Activity: Inactive (10/19/2022)   Exercise Vital Sign    Days of Exercise per Week: 0 days    Minutes of Exercise per Session: 0 min  Stress: No Stress Concern Present (10/19/2022)   [place]    Feeling of Stress : Not at all  Social Connections: Moderately Integrated (03/31/2022)   Social Connection and Isolation Panel [NHANES]    Frequency of Communication with Friends and Family: Three times a week    Frequency of Social Gatherings with Friends and Family: Three times a week    Attends Religious Services: 1 to 4 times per year    Active Member of Clubs or Organizations: Yes    Attends Archivist Meetings: 1 to 4 times per year    Marital Status: Never married  Intimate Partner Violence: Not At Risk (08/11/2022)   Humiliation, Afraid, Rape, and Kick questionnaire    Fear of Current or Ex-Partner: No    Emotionally Abused: No    Physically Abused: No    Sexually Abused: No    Outpatient Medications Prior to Visit  Medication Sig Dispense Refill   albuterol (VENTOLIN HFA) 108 (90 Base) MCG/ACT inhaler Inhale 1-2 puffs into the lungs every 6 (six) hours as needed for wheezing or shortness of breath. 90 g 2   ARIPiprazole (ABILIFY) 10 MG tablet Take 1 tablet (10 mg total) by mouth daily. (Patient not taking: Reported on 11/08/2022) 30 tablet 1    atorvastatin (LIPITOR) 80 MG tablet Take 1 tablet (80 mg total) by mouth daily. 30 tablet 1   budesonide-formoterol (SYMBICORT) 160-4.5 MCG/ACT inhaler Inhale 2 puffs into the lungs 2 (two) times daily. (Patient not taking: Reported on 11/08/2022) 1 each 3   diltiazem (CARDIZEM CD) 240 MG 24 hr capsule Take 1 capsule (240 mg total) by mouth daily. (Patient not taking: Reported on 11/08/2022) 30 capsule 1   doxepin (SINEQUAN) 50 MG capsule Take 1  capsule (50 mg total) by mouth at bedtime. (Patient not taking: Reported on 11/08/2022) 30 capsule 1   latanoprost (XALATAN) 0.005 % ophthalmic solution Place 1 drop into both eyes at bedtime. 2.5 mL 1   orphenadrine (NORFLEX) 100 MG tablet Take 1 tablet (100 mg total) by mouth 2 (two) times daily. (Patient not taking: Reported on 10/19/2022) 30 tablet 0   senna-docusate (SENOKOT-S) 8.6-50 MG tablet Take 2 tablets by mouth daily at 8 pm. (Patient not taking: Reported on 10/19/2022) 60 tablet 1   No facility-administered medications prior to visit.    Allergies  Allergen Reactions   Shellfish Allergy Hives and Rash   Shellfish-Derived Products Rash    Other reaction(s): Other (See Comments)    ROS Review of Systems  Constitutional: Negative.   HENT: Negative.  Negative for ear pain, postnasal drip, rhinorrhea, sinus pressure, sore throat, trouble swallowing and voice change.   Eyes: Negative.   Respiratory: Negative.  Negative for apnea, cough, choking, chest tightness, shortness of breath, wheezing and stridor.   Cardiovascular:  Positive for chest pain. Negative for palpitations and leg swelling.  Gastrointestinal: Negative.  Negative for abdominal distention, abdominal pain, nausea and vomiting.  Genitourinary: Negative.   Musculoskeletal: Negative.  Negative for arthralgias and myalgias.  Skin: Negative.  Negative for rash.  Allergic/Immunologic: Negative.  Negative for environmental allergies and food allergies.  Neurological: Negative.  Negative  for dizziness, syncope, weakness and headaches.  Hematological: Negative.  Negative for adenopathy. Does not bruise/bleed easily.  Psychiatric/Behavioral: Negative.  Negative for agitation, sleep disturbance and suicidal ideas. The patient is not nervous/anxious.       Objective:    Physical Exam Vitals reviewed.  Constitutional:      Appearance: Normal appearance. He is well-developed. He is obese. He is not diaphoretic.  HENT:     Head: Normocephalic and atraumatic.     Nose: No nasal deformity, septal deviation, mucosal edema or rhinorrhea.     Right Sinus: No maxillary sinus tenderness or frontal sinus tenderness.     Left Sinus: No maxillary sinus tenderness or frontal sinus tenderness.     Mouth/Throat:     Pharynx: No oropharyngeal exudate.  Eyes:     General: No scleral icterus.    Conjunctiva/sclera: Conjunctivae normal.     Pupils: Pupils are equal, round, and reactive to light.  Neck:     Thyroid: No thyromegaly.     Vascular: No carotid bruit or JVD.     Trachea: Trachea normal. No tracheal tenderness or tracheal deviation.  Cardiovascular:     Rate and Rhythm: Normal rate and regular rhythm.     Chest Wall: PMI is not displaced.     Pulses: Normal pulses. No decreased pulses.     Heart sounds: Normal heart sounds, S1 normal and S2 normal. Heart sounds not distant. No murmur heard.    No systolic murmur is present.     No diastolic murmur is present.     No friction rub. No gallop. No S3 or S4 sounds.  Pulmonary:     Effort: Pulmonary effort is normal. No tachypnea, accessory muscle usage or respiratory distress.     Breath sounds: Normal breath sounds. No stridor. No decreased breath sounds, wheezing, rhonchi or rales.  Chest:     Chest wall: No tenderness.  Abdominal:     General: Bowel sounds are normal. There is no distension.     Palpations: Abdomen is soft. Abdomen is not rigid.  Tenderness: There is no abdominal tenderness. There is no guarding or  rebound.  Musculoskeletal:        General: Normal range of motion.     Cervical back: Normal range of motion and neck supple. No edema, erythema or rigidity. No muscular tenderness. Normal range of motion.  Lymphadenopathy:     Head:     Right side of head: No submental or submandibular adenopathy.     Left side of head: No submental or submandibular adenopathy.     Cervical: No cervical adenopathy.  Skin:    General: Skin is warm and dry.     Coloration: Skin is not pale.     Findings: No rash.     Nails: There is no clubbing.  Neurological:     Mental Status: He is alert and oriented to person, place, and time.     Sensory: No sensory deficit.  Psychiatric:        Mood and Affect: Mood normal.        Speech: Speech normal.        Behavior: Behavior normal.     There were no vitals taken for this visit. Wt Readings from Last 3 Encounters:  11/08/22 250 lb (113.4 kg)  10/19/22 250 lb (113.4 kg)  09/14/22 285 lb (129.3 kg)     Health Maintenance Due  Topic Date Due   DTaP/Tdap/Td (1 - Tdap) Never done   Fecal DNA (Cologuard)  Never done   Zoster Vaccines- Shingrix (1 of 2) Never done   INFLUENZA VACCINE  06/01/2022    There are no preventive care reminders to display for this patient.  Lab Results  Component Value Date   TSH 2.640 09/09/2020   Lab Results  Component Value Date   WBC 8.5 11/08/2022   HGB 13.4 11/08/2022   HCT 43.3 11/08/2022   MCV 98.0 11/08/2022   PLT 260 11/08/2022   Lab Results  Component Value Date   NA 141 11/08/2022   K 4.0 11/08/2022   CO2 26 11/08/2022   GLUCOSE 100 (H) 11/08/2022   BUN 12 11/08/2022   CREATININE 1.46 (H) 11/08/2022   BILITOT 0.3 11/08/2022   ALKPHOS 66 11/08/2022   AST 29 11/08/2022   ALT 21 11/08/2022   PROT 7.4 11/08/2022   ALBUMIN 3.7 11/08/2022   CALCIUM 9.2 11/08/2022   ANIONGAP 7 11/08/2022   EGFR 70 01/19/2022   Lab Results  Component Value Date   CHOL 173 05/17/2022   Lab Results  Component  Value Date   HDL 53 05/17/2022   Lab Results  Component Value Date   LDLCALC 104 (H) 05/17/2022   Lab Results  Component Value Date   TRIG 89 05/17/2022   Lab Results  Component Value Date   CHOLHDL 3.3 05/17/2022   Lab Results  Component Value Date   HGBA1C 4.8 09/07/2020      Assessment & Plan:   Problem List Items Addressed This Visit   None No orders of the defined types were placed in this encounter.   Follow-up: No follow-ups on file.    Robert Noble, MD

## 2023-01-10 ENCOUNTER — Inpatient Hospital Stay
Admission: AD | Admit: 2023-01-10 | Discharge: 2023-01-21 | DRG: 885 | Disposition: A | Discharge status: Home / Self Care | Payer: Medicare HMO | Source: Intra-hospital | Attending: Psychiatry | Admitting: Psychiatry

## 2023-01-10 ENCOUNTER — Emergency Department (HOSPITAL_COMMUNITY): Payer: Medicare HMO

## 2023-01-10 ENCOUNTER — Encounter (HOSPITAL_COMMUNITY): Payer: Self-pay | Admitting: Emergency Medicine

## 2023-01-10 ENCOUNTER — Other Ambulatory Visit: Payer: Self-pay

## 2023-01-10 ENCOUNTER — Emergency Department (HOSPITAL_COMMUNITY)
Admission: EM | Admit: 2023-01-10 | Discharge: 2023-01-10 | Disposition: A | Discharge status: Other Acute Inpatient Hospital | Payer: Medicare HMO | Attending: Emergency Medicine | Admitting: Emergency Medicine

## 2023-01-10 DIAGNOSIS — I130 Hypertensive heart and chronic kidney disease with heart failure and stage 1 through stage 4 chronic kidney disease, or unspecified chronic kidney disease: Secondary | ICD-10-CM | POA: Insufficient documentation

## 2023-01-10 DIAGNOSIS — F332 Major depressive disorder, recurrent severe without psychotic features: Secondary | ICD-10-CM | POA: Diagnosis not present

## 2023-01-10 DIAGNOSIS — Z833 Family history of diabetes mellitus: Secondary | ICD-10-CM | POA: Diagnosis not present

## 2023-01-10 DIAGNOSIS — Z23 Encounter for immunization: Secondary | ICD-10-CM | POA: Diagnosis not present

## 2023-01-10 DIAGNOSIS — F17210 Nicotine dependence, cigarettes, uncomplicated: Secondary | ICD-10-CM | POA: Diagnosis present

## 2023-01-10 DIAGNOSIS — E876 Hypokalemia: Secondary | ICD-10-CM | POA: Diagnosis not present

## 2023-01-10 DIAGNOSIS — Z1152 Encounter for screening for COVID-19: Secondary | ICD-10-CM | POA: Insufficient documentation

## 2023-01-10 DIAGNOSIS — R0789 Other chest pain: Secondary | ICD-10-CM | POA: Diagnosis not present

## 2023-01-10 DIAGNOSIS — G4700 Insomnia, unspecified: Secondary | ICD-10-CM | POA: Diagnosis not present

## 2023-01-10 DIAGNOSIS — R69 Illness, unspecified: Secondary | ICD-10-CM | POA: Diagnosis not present

## 2023-01-10 DIAGNOSIS — R072 Precordial pain: Secondary | ICD-10-CM | POA: Diagnosis not present

## 2023-01-10 DIAGNOSIS — Z7951 Long term (current) use of inhaled steroids: Secondary | ICD-10-CM | POA: Insufficient documentation

## 2023-01-10 DIAGNOSIS — I2510 Atherosclerotic heart disease of native coronary artery without angina pectoris: Secondary | ICD-10-CM | POA: Diagnosis present

## 2023-01-10 DIAGNOSIS — Z8249 Family history of ischemic heart disease and other diseases of the circulatory system: Secondary | ICD-10-CM | POA: Diagnosis not present

## 2023-01-10 DIAGNOSIS — R45851 Suicidal ideations: Secondary | ICD-10-CM | POA: Diagnosis not present

## 2023-01-10 DIAGNOSIS — R059 Cough, unspecified: Secondary | ICD-10-CM | POA: Insufficient documentation

## 2023-01-10 DIAGNOSIS — I509 Heart failure, unspecified: Secondary | ICD-10-CM | POA: Diagnosis not present

## 2023-01-10 DIAGNOSIS — F419 Anxiety disorder, unspecified: Secondary | ICD-10-CM | POA: Diagnosis present

## 2023-01-10 DIAGNOSIS — J4489 Other specified chronic obstructive pulmonary disease: Secondary | ICD-10-CM | POA: Diagnosis not present

## 2023-01-10 DIAGNOSIS — I252 Old myocardial infarction: Secondary | ICD-10-CM

## 2023-01-10 DIAGNOSIS — I129 Hypertensive chronic kidney disease with stage 1 through stage 4 chronic kidney disease, or unspecified chronic kidney disease: Secondary | ICD-10-CM | POA: Diagnosis not present

## 2023-01-10 DIAGNOSIS — J449 Chronic obstructive pulmonary disease, unspecified: Secondary | ICD-10-CM | POA: Diagnosis not present

## 2023-01-10 DIAGNOSIS — Z79899 Other long term (current) drug therapy: Secondary | ICD-10-CM

## 2023-01-10 DIAGNOSIS — J45909 Unspecified asthma, uncomplicated: Secondary | ICD-10-CM | POA: Diagnosis not present

## 2023-01-10 DIAGNOSIS — N189 Chronic kidney disease, unspecified: Secondary | ICD-10-CM | POA: Diagnosis present

## 2023-01-10 DIAGNOSIS — Z91013 Allergy to seafood: Secondary | ICD-10-CM | POA: Diagnosis not present

## 2023-01-10 DIAGNOSIS — Z91148 Patient's other noncompliance with medication regimen for other reason: Secondary | ICD-10-CM

## 2023-01-10 DIAGNOSIS — R079 Chest pain, unspecified: Secondary | ICD-10-CM

## 2023-01-10 HISTORY — DX: Major depressive disorder, recurrent severe without psychotic features: F33.2

## 2023-01-10 LAB — COMPREHENSIVE METABOLIC PANEL
ALT: 15 U/L (ref 0–44)
AST: 18 U/L (ref 15–41)
Albumin: 3.5 g/dL (ref 3.5–5.0)
Alkaline Phosphatase: 65 U/L (ref 38–126)
Anion gap: 11 (ref 5–15)
BUN: 10 mg/dL (ref 8–23)
CO2: 20 mmol/L — ABNORMAL LOW (ref 22–32)
Calcium: 8.8 mg/dL — ABNORMAL LOW (ref 8.9–10.3)
Chloride: 105 mmol/L (ref 98–111)
Creatinine, Ser: 1.13 mg/dL (ref 0.61–1.24)
GFR, Estimated: >60 mL/min (ref >60)
Glucose, Bld: 117 mg/dL — ABNORMAL HIGH (ref 70–99)
Potassium: 3.4 mmol/L — ABNORMAL LOW (ref 3.5–5.1)
Sodium: 136 mmol/L (ref 135–145)
Total Bilirubin: 0.6 mg/dL (ref 0.3–1.2)
Total Protein: 6.7 g/dL (ref 6.5–8.1)

## 2023-01-10 LAB — CBC
HCT: 39 % (ref 39.0–52.0)
Hemoglobin: 12.5 g/dL — ABNORMAL LOW (ref 13.0–17.0)
MCH: 30 pg (ref 26.0–34.0)
MCHC: 32.1 g/dL (ref 30.0–36.0)
MCV: 93.8 fL (ref 80.0–100.0)
Platelets: 266 10*3/uL (ref 150–400)
RBC: 4.16 MIL/uL — ABNORMAL LOW (ref 4.22–5.81)
RDW: 12.5 % (ref 11.5–15.5)
WBC: 8.1 10*3/uL (ref 4.0–10.5)
nRBC: 0 % (ref 0.0–0.2)

## 2023-01-10 LAB — TROPONIN I (HIGH SENSITIVITY)
Troponin I (High Sensitivity): 14 ng/L (ref <18)
Troponin I (High Sensitivity): 12 ng/L (ref <18)

## 2023-01-10 LAB — ACETAMINOPHEN LEVEL: Acetaminophen (Tylenol), Serum: <10 ug/mL — ABNORMAL LOW (ref 10–30)

## 2023-01-10 LAB — RESP PANEL BY RT-PCR (RSV, FLU A&B, COVID)  RVPGX2
Influenza A by PCR: NEGATIVE
Influenza B by PCR: NEGATIVE
Resp Syncytial Virus by PCR: NEGATIVE
SARS Coronavirus 2 by RT PCR: NEGATIVE

## 2023-01-10 LAB — ETHANOL: Alcohol, Ethyl (B): <10 mg/dL (ref <10)

## 2023-01-10 LAB — SALICYLATE LEVEL: Salicylate Lvl: <7 mg/dL — ABNORMAL LOW (ref 7.0–30.0)

## 2023-01-10 MED ORDER — OLANZAPINE 5 MG PO TABS: 10.0000 mg | ORAL_TABLET | Freq: Four times a day (QID) | ORAL | Status: DC | PRN

## 2023-01-10 MED ORDER — DOXEPIN HCL 50 MG PO CAPS
50.0000 mg | ORAL_CAPSULE | Freq: Every day | ORAL | Status: DC
  Administered 2023-01-11 – 2023-01-20 (×11): 50 mg via ORAL
  Filled 2023-01-10 (×12): qty 1

## 2023-01-10 MED ORDER — MIRTAZAPINE 15 MG PO TABS: 7.5000 mg | ORAL_TABLET | Freq: Every day | ORAL | Status: DC

## 2023-01-10 MED ORDER — ACETAMINOPHEN 325 MG PO TABS
650.0000 mg | ORAL_TABLET | Freq: Four times a day (QID) | ORAL | Status: DC | PRN
  Administered 2023-01-20: 650 mg via ORAL
  Filled 2023-01-10: qty 2

## 2023-01-10 MED ORDER — LORAZEPAM 1 MG PO TABS: 1.0000 mg | ORAL_TABLET | ORAL | Status: DC | PRN

## 2023-01-10 MED ORDER — ATORVASTATIN CALCIUM 80 MG PO TABS
80.0000 mg | ORAL_TABLET | Freq: Every day | ORAL | Status: DC
  Administered 2023-01-11 – 2023-01-21 (×11): 80 mg via ORAL
  Filled 2023-01-10 (×11): qty 1

## 2023-01-10 MED ORDER — SENNOSIDES-DOCUSATE SODIUM 8.6-50 MG PO TABS
2.0000 | ORAL_TABLET | Freq: Every day | ORAL | Status: DC
  Administered 2023-01-11 (×2): 2 via ORAL
  Filled 2023-01-10 (×2): qty 2

## 2023-01-10 MED ORDER — LATANOPROST 0.005 % OP SOLN
1.0000 [drp] | Freq: Every day | OPHTHALMIC | Status: DC
  Administered 2023-01-11 – 2023-01-20 (×11): 1 [drp] via OPHTHALMIC
  Filled 2023-01-10 (×2): qty 2.5

## 2023-01-10 MED ORDER — QUETIAPINE FUMARATE 50 MG PO TABS: 50.0000 mg | ORAL_TABLET | Freq: Every day | ORAL | Status: DC

## 2023-01-10 MED ORDER — MOMETASONE FURO-FORMOTEROL FUM 200-5 MCG/ACT IN AERO
2.0000 | INHALATION_SPRAY | Freq: Two times a day (BID) | RESPIRATORY_TRACT | Status: DC
  Administered 2023-01-11 – 2023-01-21 (×21): 2 via RESPIRATORY_TRACT
  Filled 2023-01-10 (×2): qty 8.8

## 2023-01-10 MED ORDER — TRAZODONE HCL 100 MG PO TABS
100.0000 mg | ORAL_TABLET | Freq: Every evening | ORAL | Status: DC | PRN
  Administered 2023-01-11: 100 mg via ORAL
  Filled 2023-01-10: qty 1

## 2023-01-10 MED ORDER — POTASSIUM CHLORIDE CRYS ER 20 MEQ PO TBCR
40.0000 meq | EXTENDED_RELEASE_TABLET | Freq: Once | ORAL | Status: AC
  Administered 2023-01-10: 40 meq via ORAL
  Filled 2023-01-10: qty 2

## 2023-01-10 MED ORDER — MAGNESIUM HYDROXIDE 400 MG/5ML PO SUSP
30.0000 mL | Freq: Every day | ORAL | Status: DC | PRN
  Administered 2023-01-11: 30 mL via ORAL
  Filled 2023-01-10: qty 30

## 2023-01-10 MED ORDER — DILTIAZEM HCL ER COATED BEADS 240 MG PO CP24
240.0000 mg | ORAL_CAPSULE | Freq: Every day | ORAL | Status: DC
  Administered 2023-01-11 – 2023-01-14 (×4): 240 mg via ORAL
  Filled 2023-01-10 (×4): qty 1

## 2023-01-10 MED ORDER — OLANZAPINE 10 MG PO TABS: 5.0000 mg | ORAL_TABLET | Freq: Every day | ORAL | Status: DC

## 2023-01-10 MED ORDER — ALBUTEROL SULFATE (2.5 MG/3ML) 0.083% IN NEBU: 2.5000 mg | INHALATION_SOLUTION | Freq: Four times a day (QID) | RESPIRATORY_TRACT | Status: DC | PRN

## 2023-01-10 MED ORDER — ALUM & MAG HYDROXIDE-SIMETH 200-200-20 MG/5ML PO SUSP
30.0000 mL | ORAL | Status: DC | PRN
  Filled 2023-01-10: qty 30

## 2023-01-10 MED ORDER — QUETIAPINE FUMARATE 25 MG PO TABS: 50.0000 mg | ORAL_TABLET | Freq: Every day | ORAL | Status: DC

## 2023-01-10 NOTE — ED Triage Notes (Signed)
Pt reports mid sternal CP beginning last night. Pt also reports mental health problems. Pt reports he has a plan to OD on pills. Reports 3 of his brothers have committed suicide. Pt reports increased stress recently. Pt reports hearing voices, having hallucinations, sleeping sometimes. Asking for help before he does something. Pt calm, cooperative at present.

## 2023-01-10 NOTE — ED Notes (Signed)
Psychiatry talking to pt 

## 2023-01-10 NOTE — [facility] Notes (Deleted)
[facility] Notes:  (Nursing/MHT/Case Management/Adjunct)   Date:  01/10/2023  Time:  8:15 PM   Type of Therapy:   Wrap Up    Participation Level:  Did not attend      Ashley-Marie Windell Musson  01/10/2023 8:15 PM

## 2023-01-10 NOTE — Progress Notes (Addendum)
Pt was accepted to [facility] 01/10/23;Bed Assignment 27  Per [facility] nursing please fax his voluntary form to [phone] when you get a chance.   Pt meets inpatient criteria per Vesta Mixer, NP   Attending Physician will be Dr. Autumn Messing  Report can be called to: [phone]  Pt can arrive after: Nursing staff will coordinate.   Care Team notified: Night Carlton Scharlene Gloss, RN, Emerson Monte, RN, Orland Dec, RN, Vesta Mixer, NP, Sammuel Bailiff, RN, Amy Boisvert,RN, [place], [place], Maxie Better, RN, [place], RN, Day [facility] Lynnda Shields, RN  [place], [place] 01/10/2023 @ 8:38 PM room 33. Call [phone] after 7 to give report.

## 2023-01-10 NOTE — ED Notes (Signed)
Pt is sitting in triage until they can be roomed

## 2023-01-10 NOTE — ED Notes (Signed)
Pt stating they are hungry, Probation officer provided pt with graham crackers, peanut butter and a cranberry juice

## 2023-01-10 NOTE — ED Notes (Signed)
Percell Miller PA notified this RN that EKG wasn't obtained. This RN notified PA that staff will get the EKG.

## 2023-01-10 NOTE — ED Notes (Signed)
ED Provider at bedside. 

## 2023-01-10 NOTE — Consult Note (Signed)
[place] ED ASSESSMENT   Reason for Consult:  SI Referring Physician:  Percell Miller, [place] Patient Identification: Robert Chan MRN:  [id number] ED Chief Complaint: Major depressive disorder, recurrent episode, severe ([place])  Diagnosis:  Principal Problem:   Major depressive disorder, recurrent episode, severe ([place]) Active Problems:   Suicidal ideation   ED Assessment Time Calculation: Start Time: 1600 Stop Time: 1700 Total Time in Minutes (Assessment Completion): 60   HPI:   Robert Chan is a 67 y.o. male patient who voluntarily presented to [facility] due to worsening chest pain and depressive symptoms. Pt requesting to speak with psychiatry to restart psychotropic medications. He expresses suicidal ideations with active plan to overdose. Pt reports all three of his brothers committed suicide, and he is the last sibling alive. He reports he does not want to kill himself like his brothers did. Pt has had no psychiatric OP follow up since discharge from IP admission at [facility] in October of 2023.   Subjective:   Pt seen today at [facility] for face to face evaluation. Pt is pleasant, calm, and willing to engage in assessment. Pt tells me since his discharge from [facility] IP in October, he continued to take his Abilify until he ran out. He never followed up with psychiatric OP follow up, and never got his Abilify refilled. He has been off psychotropic medications since around November of 2023. Pt states he was doing well until the past few weeks, he feels as if his depression has been worsening with no apparent triggers. Pt stated he has been thinking about a lot of his deceased family members recently, and has been feeling isolated/lonely. He has had some difficulty staying asleep at night. No changes in appetite. He does endorse suicidal ideations x1 week. Today he thought about overdosing on pills/drugs which is why he presented to hospital for evaluation. He endorses intermittent HI towards his brother in law. He reports  he actually thought about killing him a few weeks ago. He denies current AH or VH. However patient feels like occasionally he sees shadows. He states he will hear voices at times but unable to elaborate if they say anything specific/command. States it is just "noise."   He does endorse occasional THC and alcohol use. Denies any other illicit substances. He receives disability and SS, he still lives alone in an apartment. He does have a relationship with his daughter, they speak occasionally. He states he didn't follow up with OP psychiatry due to feeling better and feeling like he didn't need any refills.   We spoke about his previous medication, Abilify. Pt stated it was "okay" but felt like it did not help him much. He was impartial to starting this again, and willing to try other medications recommended. Will start Remeron 7.5 mg Qhs to target reoccurring depressive symptoms. Will also start Seroquel 50 mg Qhs to help with further mood stabilization and hallucinations. Will recommend for inpatient psychiatric treatment once patient is medically cleared.   Past Psychiatric History:  MDD, substance use  Risk to Self or Others: Is the patient at risk to self? Yes Has the patient been a risk to self in the past 6 months? Yes Has the patient been a risk to self within the distant past? No Is the patient a risk to others? No Has the patient been a risk to others in the past 6 months? No Has the patient been a risk to others within the distant past? No  [place] Scale:  [place] ED from  01/10/2023 in [facility] Emergency Department at [facility] ED from 11/08/2022 in [facility] Emergency Department at [facility] ED from 09/07/2022 in [facility] Emergency Department at [place] CATEGORY High Risk High Risk Error: Q3, 4, or 5 should not be populated when Q2 is No       Past Medical History:  Past Medical History:  Diagnosis Date   Acute bilateral low  back pain without sciatica 02/27/2021   Asthma    CAD (coronary artery disease)    Cataract    CKD (chronic kidney disease)    Cocaine use disorder, mild, abuse (HCC)    COPD (chronic obstructive pulmonary disease) (HCC)    Coronary vasospasm ([place]) 10/07/2020   Depression    Elevated serum creatinine 08/28/2019   Homelessness 06/19/2020   Hypertension    NSTEMI (non-ST elevated myocardial infarction) [facility])    Status post incision and drainage 04/22/2021   Suicidal ideation 06/05/2020    Past Surgical History:  Procedure Laterality Date   APPENDECTOMY     EYE SURGERY     LEFT HEART CATH AND CORONARY ANGIOGRAPHY N/A 06/05/2020   Procedure: LEFT HEART CATH AND CORONARY ANGIOGRAPHY;  Surgeon: Nigel Mormon, MD;  Location: [place];  Service: Cardiovascular;  Laterality: N/A;   PROSTATE SURGERY     Family History:  Family History  Problem Relation Age of Onset   Hypertension Mother    Diabetes Mother    Hypertension Father    Hypertension Sister    Social History:  Social History   Substance and Sexual Activity  Alcohol Use Yes   Comment: drinks 2x/wk, 2-3 40 oz drinks, last drink Friday     Social History   Substance and Sexual Activity  Drug Use Not Currently   Types: Cocaine   Comment: cocaine use once a month, last use Friday     Social History   Socioeconomic History   Marital status: Single    Spouse name: Not on file   Number of children: Not on file   Years of education: Not on file   Highest education level: Not on file  Occupational History   Not on file  Tobacco Use   Smoking status: Some Days    Types: Cigarettes    Last attempt to quit: 12/25/2020    Years since quitting: 2.0   Smokeless tobacco: Never   Tobacco comments:    smokes 3-4 cigarettes/day  Vaping Use   Vaping Use: Never used  Substance and Sexual Activity   Alcohol use: Yes    Comment: drinks 2x/wk, 2-3 40 oz drinks, last drink Friday   Drug use: Not Currently    Types:  Cocaine    Comment: cocaine use once a month, last use Friday    Sexual activity: Not Currently  Other Topics Concern   Not on file  Social History Narrative   His mother passed at age 52 (when he was 28 years old) and his father raised him and his siblings   His parents were pastors as a lot of his sisters and other family members are today   There was a total of 13 children in his family Had 13 sisters, one sister deceased , Had 3 brothers, all brothers deceased   He is the youngest of the 50 and was "always in trouble in my younger years"   Family still live in [place], [place], some in [place], [place]  Has a Daughter and son with a total of 10 grandchildren (34 grand daughters local and 5 grandsons)    Values family   Married twice, present wife incarcerated   Social Determinants of Health   Financial Resource Strain: Low Risk  (10/19/2022)   Overall Financial Resource Strain (CARDIA)    Difficulty of Paying Living Expenses: Not hard at all  Food Insecurity: No Food Insecurity (10/19/2022)   Hunger Vital Sign    Worried About Running Out of Food in the Last Year: Never true    Ran Out of Food in the Last Year: Never true  Transportation Needs: No Transportation Needs (10/19/2022)   PRAPARE - Hydrologist (Medical): No    Lack of Transportation (Non-Medical): No  Physical Activity: Inactive (10/19/2022)   Exercise Vital Sign    Days of Exercise per Week: 0 days    Minutes of Exercise per Session: 0 min  Stress: No Stress Concern Present (10/19/2022)   [place]    Feeling of Stress : Not at all  Social Connections: Moderately Integrated (03/31/2022)   Social Connection and Isolation Panel [NHANES]    Frequency of Communication with Friends and Family: Three times a week    Frequency of Social Gatherings with Friends and Family: Three times a week    Attends Religious Services: 1 to  4 times per year    Active Member of Clubs or Organizations: Yes    Attends Archivist Meetings: 1 to 4 times per year    Marital Status: Never married     Allergies:   Allergies  Allergen Reactions   Shellfish Allergy Hives and Rash   Shellfish-Derived Products Rash    Other reaction(s): Other (See Comments)    Labs:  Results for orders placed or performed during the hospital encounter of 01/10/23 (from the past 48 hour(s))  CBC     Status: Abnormal   Collection Time: 01/10/23  1:30 PM  Result Value Ref Range   WBC 8.1 4.0 - 10.5 K/uL   RBC 4.16 (L) 4.22 - 5.81 MIL/uL   Hemoglobin 12.5 (L) 13.0 - 17.0 g/dL   HCT 39.0 39.0 - 52.0 %   MCV 93.8 80.0 - 100.0 fL   MCH 30.0 26.0 - 34.0 pg   MCHC 32.1 30.0 - 36.0 g/dL   RDW 12.5 11.5 - 15.5 %   Platelets 266 150 - 400 K/uL   nRBC 0.0 0.0 - 0.2 %    Comment: Performed at [facility], [place] [address], [place], [place] [address]  Comprehensive metabolic panel     Status: Abnormal   Collection Time: 01/10/23  1:30 PM  Result Value Ref Range   Sodium 136 135 - 145 mmol/L   Potassium 3.4 (L) 3.5 - 5.1 mmol/L   Chloride 105 98 - 111 mmol/L   CO2 20 (L) 22 - 32 mmol/L   Glucose, Bld 117 (H) 70 - 99 mg/dL    Comment: Glucose reference range applies only to samples taken after fasting for at least 8 hours.   BUN 10 8 - 23 mg/dL   Creatinine, Ser 1.13 0.61 - 1.24 mg/dL   Calcium 8.8 (L) 8.9 - 10.3 mg/dL   Total Protein 6.7 6.5 - 8.1 g/dL   Albumin 3.5 3.5 - 5.0 g/dL   AST 18 15 - 41 U/L   ALT 15 0 - 44 U/L   Alkaline Phosphatase 65  38 - 126 U/L   Total Bilirubin 0.6 0.3 - 1.2 mg/dL   GFR, Estimated >60 >60 mL/min    Comment: (NOTE) Calculated using the CKD-EPI Creatinine Equation (2021)    Anion gap 11 5 - 15    Comment: Performed at [facility] [address], [place], [place] [address]  Ethanol     Status: None   Collection Time: 01/10/23  1:30 PM  Result Value Ref Range   Alcohol, Ethyl (B) <10 <10  mg/dL    Comment: (NOTE) Lowest detectable limit for serum alcohol is 10 mg/dL.  For medical purposes only. Performed at [facility], [place] [address], [place], [place] [id number]   Salicylate level     Status: Abnormal   Collection Time: 01/10/23  1:30 PM  Result Value Ref Range   Salicylate Lvl Q000111Q (L) 7.0 - 30.0 mg/dL    Comment: Performed at [facility] [address] [place], [place] [address]  Acetaminophen level     Status: Abnormal   Collection Time: 01/10/23  1:30 PM  Result Value Ref Range   Acetaminophen (Tylenol), Serum <10 (L) 10 - 30 ug/mL    Comment: (NOTE) Therapeutic concentrations vary significantly. A range of 10-30 ug/mL  may be an effective concentration for many patients. However, some  are best treated at concentrations outside of this range. Acetaminophen concentrations >150 ug/mL at 4 hours after ingestion  and >50 ug/mL at 12 hours after ingestion are often associated with  toxic reactions.  Performed at [facility], [place] [address], [place], [place] [address]   Troponin I (High Sensitivity)     Status: None   Collection Time: 01/10/23  1:30 PM  Result Value Ref Range   Troponin I (High Sensitivity) 14 <18 ng/L    Comment: (NOTE) Elevated high sensitivity troponin I (hsTnI) values and significant  changes across serial measurements may suggest ACS but many other  chronic and acute conditions are known to elevate hsTnI results.  Refer to the "Links" section for chest pain algorithms and additional  guidance. Performed at [facility], [place] [address], [place], [place] [address]     Current Facility-Administered Medications  Medication Dose Route Frequency Provider Last Rate Last Admin   mirtazapine (REMERON) tablet 7.5 mg  7.5 mg Oral QHS Vesta Mixer, NP       potassium chloride SA (KLOR-CON M) CR tablet 40 mEq  40 mEq Oral Once Suella Broad A, PA-C       QUEtiapine (SEROQUEL) tablet 50 mg  50 mg Oral QHS Vesta Mixer, NP       Current Outpatient Medications  Medication Sig Dispense Refill   albuterol (VENTOLIN HFA) 108 (90 Base) MCG/ACT inhaler Inhale 1-2 puffs into the lungs every 6 (six) hours as needed for wheezing or shortness of breath. 90 g 2   ARIPiprazole (ABILIFY) 10 MG tablet Take 1 tablet (10 mg total) by mouth daily. (Patient not taking: Reported on 11/08/2022) 30 tablet 1   atorvastatin (LIPITOR) 80 MG tablet Take 1 tablet (80 mg total) by mouth daily. 30 tablet 1   budesonide-formoterol (SYMBICORT) 160-4.5 MCG/ACT inhaler Inhale 2 puffs into the lungs 2 (two) times daily. (Patient not taking: Reported on 11/08/2022) 1 each 3   diltiazem (CARDIZEM CD) 240 MG 24 hr capsule Take 1 capsule (240 mg total) by mouth daily. (Patient not taking: Reported on 11/08/2022) 30 capsule 1   doxepin (SINEQUAN) 50 MG capsule  Take 1 capsule (50 mg total) by mouth at bedtime. (Patient not taking: Reported on 11/08/2022) 30 capsule 1   latanoprost (XALATAN) 0.005 % ophthalmic solution Place 1 drop into both eyes at bedtime. 2.5 mL 1   orphenadrine (NORFLEX) 100 MG tablet Take 1 tablet (100 mg total) by mouth 2 (two) times daily. (Patient not taking: Reported on 10/19/2022) 30 tablet 0   senna-docusate (SENOKOT-S) 8.6-50 MG tablet Take 2 tablets by mouth daily at 8 pm. (Patient not taking: Reported on 10/19/2022) 60 tablet 1   Psychiatric Specialty Exam: Presentation  General Appearance:  Appropriate for Environment  Eye Contact: Good  Speech: Clear and Coherent  Speech Volume: Normal  Handedness: Right   Mood and Affect  Mood: Depressed  Affect: Congruent; Flat   Thought Process  Thought Processes: Coherent  Descriptions of Associations:Intact  Orientation:Full (Time, Place and Person)  Thought Content:Logical  History of Schizophrenia/Schizoaffective disorder:No  Duration of Psychotic Symptoms:N/A  Hallucinations:Hallucinations: Visual Description of Visual Hallucinations:  "shadows"  Ideas of Reference:None  Suicidal Thoughts:Suicidal Thoughts: Yes, Active SI Active Intent and/or Plan: With Intent; With Plan  Homicidal Thoughts:Homicidal Thoughts: No   Sensorium  Memory: Immediate Fair; Recent Fair  Judgment: Fair  Insight: Fair   Materials engineer: Fair  Attention Span: Fair  Recall: [employer] of Knowledge: Fair  Language: Fair   Psychomotor Activity  Psychomotor Activity: Psychomotor Activity: Normal   Assets  Assets: Desire for Improvement; Leisure Time; Physical Health; Resilience    Sleep  Sleep: Sleep: Fair   Physical Exam: Physical Exam Neurological:     Mental Status: He is alert and oriented to person, place, and time.  Psychiatric:        Attention and Perception: Attention normal.        Mood and Affect: Mood is depressed. Affect is flat.        Speech: Speech normal.        Behavior: Behavior is cooperative.        Thought Content: Thought content includes suicidal ideation. Thought content includes suicidal plan.    Review of Systems  Psychiatric/Behavioral:  Positive for depression, hallucinations, substance abuse and suicidal ideas. The patient has insomnia.   All other systems reviewed and are negative.  Blood pressure 129/74, pulse 76, temperature 97.9 F (36.6 C), temperature source Oral, resp. rate 16, height '6\' 4"'$  (1.93 m), weight 117.9 kg, SpO2 93 %. Body mass index is 31.65 kg/m.  Medical Decision Making: Pt case reviewed and discussed with Dr. Dwyane Dee. Will recommend inpatient psychiatric admission once medically cleared. If no current availability at [facility], will advise CSW to fax out.    Disposition: Recommend psychiatric Inpatient admission when medically cleared.  Vesta Mixer, NP 01/10/2023 4:22 PM

## 2023-01-10 NOTE — Progress Notes (Signed)
Just received report from [place], [place] at [facility] on pt coming to room 27.

## 2023-01-10 NOTE — ED Notes (Signed)
Pt's belongings placed in the purple zone in locker #3. Pt had 2 pt belongings bags, pt changed into burgundy scrubs

## 2023-01-10 NOTE — ED Notes (Signed)
Pt's dinner has arrived, pt sitting up and eating his dinner

## 2023-01-10 NOTE — ED Provider Notes (Signed)
Patient accepted to [facility]. Accepting physician is Dr. Ivan Anchors, MD 01/10/23 2209

## 2023-01-10 NOTE — ED Provider Notes (Signed)
[facility] Provider Note   CSN: [id number] Arrival date & time: 01/10/23  1237     History  Chief Complaint  Patient presents with   Chest Pain   Cough   Suicidal    Robert Chan is a 67 y.o. male.  67 year old male with past medical history of asthma, COPD, hypertension, depression, CAD, CKD, NSTEMI, cocaine use disorder (denies current use) presents with complaint of chest pain and suicidal ideation. States that he has a discomfort in his chest that has been present since yesterday, constant, nothing makes his pain any better or worse.  States that he has a history of asthma and COPD but this feels different.  Denies associated nausea, diaphoresis. Reports feeling depressed with plan to overdose on pills.  States that he has 3 brothers who committed suicide this way and it seems nice to be able to go to sleep.  States that he is out of his depression medication for the past few months, unsure what he used to take, it was 1 pill daily.  Sees his PCP but does not get this medication from them, does not see mental health.  States he has daughters and granddaughters and it would "tear them up if they knew I was feeling this way, I want to start my medication again so that I can be better."       Home Medications Prior to Admission medications   Medication Sig Start Date End Date Taking? Authorizing Provider  albuterol (VENTOLIN HFA) 108 (90 Base) MCG/ACT inhaler Inhale 1-2 puffs into the lungs every 6 (six) hours as needed for wheezing or shortness of breath. 10/20/22   Elsie Stain, MD  ARIPiprazole (ABILIFY) 10 MG tablet Take 1 tablet (10 mg total) by mouth daily. Patient not taking: Reported on 11/08/2022 08/18/22   Clapacs, Madie Reno, MD  atorvastatin (LIPITOR) 80 MG tablet Take 1 tablet (80 mg total) by mouth daily. 08/18/22 08/19/23  Clapacs, Madie Reno, MD  budesonide-formoterol (SYMBICORT) 160-4.5 MCG/ACT inhaler Inhale 2 puffs into  the lungs 2 (two) times daily. Patient not taking: Reported on 11/08/2022 10/20/22   Elsie Stain, MD  diltiazem (CARDIZEM CD) 240 MG 24 hr capsule Take 1 capsule (240 mg total) by mouth daily. Patient not taking: Reported on 11/08/2022 08/18/22   Clapacs, Madie Reno, MD  doxepin (SINEQUAN) 50 MG capsule Take 1 capsule (50 mg total) by mouth at bedtime. Patient not taking: Reported on 11/08/2022 08/18/22   Clapacs, Madie Reno, MD  latanoprost (XALATAN) 0.005 % ophthalmic solution Place 1 drop into both eyes at bedtime. 10/20/22   Elsie Stain, MD  orphenadrine (NORFLEX) 100 MG tablet Take 1 tablet (100 mg total) by mouth 2 (two) times daily. Patient not taking: Reported on [id number]   Delora Fuel, MD  senna-docusate (SENOKOT-S) 8.6-50 MG tablet Take 2 tablets by mouth daily at 8 pm. Patient not taking: Reported on 10/19/2022 08/18/22   Clapacs, Madie Reno, MD      Allergies    Shellfish allergy and Shellfish-derived products    Review of Systems   Review of Systems Negative except as per HPI Physical Exam Updated Vital Signs BP 129/74   Pulse 76   Temp 97.9 F (36.6 C) (Oral)   Resp 16   Ht '6\' 4"'$  (1.93 m)   Wt 116.6 kg   SpO2 93%   BMI 31.28 kg/m  Physical Exam Vitals and nursing note reviewed.  Constitutional:  General: He is not in acute distress.    Appearance: He is well-developed. He is not diaphoretic.  HENT:     Head: Normocephalic and atraumatic.  Cardiovascular:     Rate and Rhythm: Normal rate and regular rhythm.     Heart sounds: Normal heart sounds.  Pulmonary:     Effort: Pulmonary effort is normal.     Breath sounds: Normal breath sounds.  Chest:     Chest wall: No tenderness.  Abdominal:     Palpations: Abdomen is soft.     Tenderness: There is no abdominal tenderness.  Musculoskeletal:     Right lower leg: No tenderness. No edema.     Left lower leg: No tenderness. No edema.  Skin:    General: Skin is warm and dry.     Findings: No erythema  or rash.  Neurological:     Mental Status: He is alert and oriented to person, place, and time.  Psychiatric:        Behavior: Behavior normal.     ED Results / Procedures / Treatments   Labs (all labs ordered are listed, but only abnormal results are displayed) Labs Reviewed  CBC - Abnormal; Notable for the following components:      Result Value   RBC 4.16 (*)    Hemoglobin 12.5 (*)    All other components within normal limits  COMPREHENSIVE METABOLIC PANEL - Abnormal; Notable for the following components:   Potassium 3.4 (*)    CO2 20 (*)    Glucose, Bld 117 (*)    Calcium 8.8 (*)    All other components within normal limits  SALICYLATE LEVEL - Abnormal; Notable for the following components:   Salicylate Lvl [id number] (*)    All other components within normal limits  ACETAMINOPHEN LEVEL - Abnormal; Notable for the following components:   Acetaminophen (Tylenol), Serum <10 (*)    All other components within normal limits  RESP PANEL BY RT-PCR (RSV, FLU A&B, COVID)  [id number]  ETHANOL  RAPID URINE DRUG SCREEN, HOSP PERFORMED  TROPONIN I (HIGH SENSITIVITY)  TROPONIN I (HIGH SENSITIVITY)    EKG EKG Interpretation  Date/Time:  Monday January 10 2023 18:29:15 EDT Ventricular Rate:  75 PR Interval:  236 QRS Duration: 94 QT Interval:  422 QTC Calculation: 471 R Axis:   102 Text Interpretation: Sinus rhythm with 1st degree A-V block Rightward axis Nonspecific T wave abnormality Prolonged QT no significant change since Jan 2024 Confirmed by Sherwood Gambler [phone]) on 01/10/2023 6:32:16 PM  Radiology DG Chest 2 View  Result Date: 01/10/2023 CLINICAL DATA:  Chest pain, midsternal chest pain since last night EXAM: CHEST - 2 VIEW COMPARISON:  11/08/2022 FINDINGS: Normal heart size, mediastinal contours, and pulmonary vascularity. Lungs clear. No pulmonary infiltrate, pleural effusion, or pneumothorax. Mild degenerative changes thoracic spine. IMPRESSION: No acute abnormalities.  Electronically Signed   By: Lavonia Dana M.D.   On: 01/10/2023 13:52    Procedures Procedures    Medications Ordered in ED Medications  mirtazapine (REMERON) tablet 7.5 mg (has no administration in time range)  QUEtiapine (SEROQUEL) tablet 50 mg (has no administration in time range)  potassium chloride SA (KLOR-CON M) CR tablet 40 mEq (40 mEq Oral Given 01/10/23 1740)    ED Course/ Medical Decision Making/ A&P                             Medical Decision Making Amount and/or Complexity  of Data Reviewed Labs: ordered. Radiology: ordered.  Risk Prescription drug management.   This patient presents to the ED for concern of CP, depression, this involves an extensive number of treatment options, and is a complaint that carries with it a high risk of complications and morbidity.  The differential diagnosis includes but not limited to ACS, depression, SI, psychosis, substance abuse    Co morbidities that complicate the patient evaluation  Depression, asthma, COPD, CAD, CKD, NSTEMI   Additional history obtained:  External records from outside source obtained and reviewed including prior lab on file for comparison   Lab Tests:  I Ordered, and personally interpreted labs.  The pertinent results include: CMP with mild hypokalemia with potassium 3.4.  Salicylate and acetaminophen levels negative.  Alcohol negative.  CBC without significant findings.  Troponins 14 and 12, no significant change.  COVID/flu/RSV negative.   Imaging Studies ordered:  I ordered imaging studies including CXR  I independently visualized and interpreted imaging which showed no acute abnormality  I agree with the radiologist interpretation   Cardiac Monitoring: / EKG:  The patient was maintained on a cardiac monitor.  I personally viewed and interpreted the cardiac monitored which showed an underlying rhythm of: Sinus rhythm, rate 75   Consultations Obtained:  I requested consultation with the  behavioral health team,  and discussed lab and imaging findings as well as pertinent plan - they recommend: Inpatient treatment   Problem List / ED Course / Critical interventions / Medication management  67 year old male presents with concern for chest pain as well as suicidal ideation with plan to overdose on pills as above.  Workup complete, EKG without ischemic changes, troponins unchanged.  Patient is medically cleared for behavioral health evaluation and disposition. I ordered medication including potassium for mild hypokalemia Reevaluation of the patient after these medicines showed that the patient stayed the same I have reviewed the patients home medicines and have made adjustments as needed   Social Determinants of Health:  Has PCP   Test / Admission - Considered:  Admit to behavioral health         Final Clinical Impression(s) / ED Diagnoses Final diagnoses:  Suicidal ideations  Chest pain, unspecified type  Hypokalemia    Rx / DC Orders ED Discharge Orders     None         Tacy Learn, PA-C 01/10/23 1837    Sherwood Gambler, MD 01/13/23 1123

## 2023-01-10 NOTE — ED Notes (Signed)
Pt denies any CP at this time.

## 2023-01-11 ENCOUNTER — Encounter: Payer: Self-pay | Admitting: Psychiatry

## 2023-01-11 DIAGNOSIS — F332 Major depressive disorder, recurrent severe without psychotic features: Principal | ICD-10-CM

## 2023-01-11 MED ORDER — ARIPIPRAZOLE 5 MG PO TABS
5.0000 mg | ORAL_TABLET | Freq: Every day | ORAL | Status: DC
  Administered 2023-01-11 – 2023-01-13 (×3): 5 mg via ORAL
  Filled 2023-01-11 (×3): qty 1

## 2023-01-11 MED ORDER — INFLUENZA VAC A&B SA ADJ QUAD 0.5 ML IM PRSY
0.5000 mL | PREFILLED_SYRINGE | INTRAMUSCULAR | Status: AC
  Administered 2023-01-12: 0.5 mL via INTRAMUSCULAR
  Filled 2023-01-11: qty 0.5

## 2023-01-11 MED ORDER — MIRTAZAPINE 15 MG PO TABS
15.0000 mg | ORAL_TABLET | Freq: Every day | ORAL | Status: DC
  Administered 2023-01-11 – 2023-01-14 (×4): 15 mg via ORAL
  Filled 2023-01-11 (×4): qty 1

## 2023-01-11 MED ORDER — QUETIAPINE FUMARATE 100 MG PO TABS
100.0000 mg | ORAL_TABLET | Freq: Every evening | ORAL | Status: DC | PRN
  Administered 2023-01-11: 100 mg via ORAL
  Filled 2023-01-11: qty 1

## 2023-01-11 NOTE — Progress Notes (Signed)
Patient is A+O x 4. He denies SI/HI/AVH. He denies anxiety and depression. Patient is calm and cooperative. Appetite good. Medication compliant. Pain 0/10.  Patient has not had a bowel movement in a few days and states that he feels 'backed up'. Milk of Mag adm at 1749. New med education provided.  Q15 minute unit checks in place.

## 2023-01-11 NOTE — Plan of Care (Signed)
  Problem: Health Behavior/Discharge Planning: Goal: Ability to manage health-related needs will improve Outcome: Progressing   Problem: Nutrition: Goal: Adequate nutrition will be maintained Outcome: Progressing   Problem: Safety: Goal: Ability to remain free from injury will improve Outcome: Progressing   
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
## 2023-01-11 NOTE — Tx Team (Signed)
Initial Treatment Plan 01/11/2023 1:55 AM [name] [id number]    PATIENT STRESSORS: Health problems   Loss of 3 brothers to suicide   Medication change or noncompliance     PATIENT STRENGTHS: Average or above average intelligence  Capable of independent living  Motivation for treatment/growth    PATIENT IDENTIFIED PROBLEMS: SI (plan to OD on pills)  Depression  Medication non-compliance  HI (non-specific)  ("Wants to get back on his medications")             DISCHARGE CRITERIA:  Improved stabilization in mood, thinking, and/or behavior Motivation to continue treatment in a less acute level of care Verbal commitment to aftercare and medication compliance  PRELIMINARY DISCHARGE PLAN: Outpatient therapy Return to previous living arrangement  PATIENT/FAMILY INVOLVEMENT: This treatment plan has been presented to and reviewed with the patient, Donna Decoteau, and/or family member.  The patient and family have been given the opportunity to ask questions and make suggestions.  Lajoyce Corners, RN 01/11/2023, 1:55 AM

## 2023-01-11 NOTE — Group Note (Signed)
Recreation Therapy Group Note   Group Topic:Communication  Group Date: 01/11/2023 Start Time: 1400 End Time: 1455 Facilitators: Vilma Prader, LRT, CTRS Location: Courtyard  Group Description: [employer]. LRT brought pts outside to the courtyard to get fresh air and sunlight. During the time outside, we tossed around a beach ball that has many different prompts and questions on it while listening to music. After playing, pts and LRT played blackjack with a standard deck of cards.   Affect/Mood: N/A   Participation Level: Did not attend    Clinical Observations/Individualized Feedback: [place] did not attend group due to resting in his room.  Plan: Continue to engage patient in RT group sessions 2-3x/week.   Vilma Prader, LRT, CTRS 01/11/2023 3:11 PM

## 2023-01-11 NOTE — H&P (Signed)
Psychiatric Admission Assessment Adult  Patient Identification: Robert Chan MRN:  [name] Date of Evaluation:  01/11/2023 Chief Complaint:  MDD (major depressive disorder), recurrent severe, without psychosis ([place]) [F33.2] Principal Diagnosis: MDD (major depressive disorder), recurrent severe, without psychosis ([place]) Diagnosis:  Principal Problem:   MDD (major depressive disorder), recurrent severe, without psychosis ([place])  History of Present Illness: Camdin is a 67 year old African-American male who was discharged from [facility] back in October who presents today with depression and suicidal ideation.  He is currently voluntary.  He tells me he has not been taking his medications since discharge.  He did not have any outpatient follow-up.  Has been noncompliant.  He does have a history of substance abuse but currently denies any recent use.  He endorses anhedonia, difficulty sleeping, depressed mood, hopelessness and helplessness.  States that his nephew overdosed and that his brother was killed in a car wreck.  He has multiple medical problems including hypertension, COPD, and cardiac disease.  He sees Dr. Joya Gaskins at [place].  He has also been admitted to [facility] behavioral health in the past year.  Per psychiatric nurse practitioner evaluation: Brylee Walquist is a 67 y.o. male patient who voluntarily presented to [facility] due to worsening chest pain and depressive symptoms. Pt requesting to speak with psychiatry to restart psychotropic medications. He expresses suicidal ideations with active plan to overdose. Pt reports all three of his brothers committed suicide, and he is the last sibling alive. He reports he does not want to kill himself like his brothers did.   Pt has had no psychiatric OP follow up since discharge from IP admission at [facility] in October of 2023. Since his discharge from [facility] IP in October, he continued to take his Abilify until he ran out. He never followed up with psychiatric OP  follow up, and never got his Abilify refilled. He has been off psychotropic medications since around November of 2023. Pt states he was doing well until the past few weeks, he feels as if his depression has been worsening with no apparent triggers. Pt stated he has been thinking about a lot of his deceased family members recently, and has been feeling isolated/lonely. He has had some difficulty staying asleep at night. No changes in appetite. He does endorse suicidal ideations x1 week. Today he thought about overdosing on pills/drugs which is why he presented to hospital for evaluation. He endorses intermittent HI towards his brother in law. He reports he actually thought about killing him a few weeks ago. He denies current AH or VH. However patient feels like occasionally he sees shadows. He states he will hear voices at times but unable to elaborate if they say anything specific/command. States it is just "noise."    He does endorse occasional THC and alcohol use. Denies any other illicit substances. He receives disability and SS, he still lives alone in an apartment. He does have a relationship with his daughter, they speak occasionally. He states he didn't follow up with OP psychiatry due to feeling better and feeling like he didn't need any refills.   Associated Signs/Symptoms: Depression Symptoms:  depressed mood, anhedonia, insomnia, hopelessness, (Hypo) Manic Symptoms:  Impulsivity, Anxiety Symptoms:  Excessive Worry, Psychotic Symptoms:   Denied PTSD Symptoms: NA Total Time spent with patient: 1 hour  Past Psychiatric History: Extensive history of depression and polysubstance abuse.  Is the patient at risk to self? Yes.    Has the patient been a risk to self in the past 6 months?  Yes.    Has the patient been a risk to self within the distant past? Yes.    Is the patient a risk to others? No.  Has the patient been a risk to others in the past 6 months? No.  Has the patient been a risk  to others within the distant past? No.   [place] Scale:  [place] Admission (Current) from 01/10/2023 in [facility] Most recent reading at 01/10/2023 11:39 PM ED from 01/10/2023 in [facility] Emergency Department at [facility] Most recent reading at 01/10/2023  1:13 PM ED from 11/08/2022 in [facility] Emergency Department at [facility] Most recent reading at 11/08/2022  4:06 PM  C-SSRS RISK CATEGORY High Risk High Risk High Risk        Prior Inpatient Therapy: Yes.   If yes, describe [facility] and [facility]. Prior Outpatient Therapy: Yes.   If yes, describe: [facility]   Alcohol Screening: 1. How often do you have a drink containing alcohol?: 2 to 3 times a week 2. How many drinks containing alcohol do you have on a typical day when you are drinking?: 5 or 6 3. How often do you have six or more drinks on one occasion?: Weekly AUDIT-C Score: 8 4. How often during the last year have you found that you were not able to stop drinking once you had started?: Never 5. How often during the last year have you failed to do what was normally expected from you because of drinking?: Less than monthly 6. How often during the last year have you needed a first drink in the morning to get yourself going after a heavy drinking session?: Never 7. How often during the last year have you had a feeling of guilt of remorse after drinking?: Never 8. How often during the last year have you been unable to remember what happened the night before because you had been drinking?: Never 9. Have you or someone else been injured as a result of your drinking?: No 10. Has a relative or friend or a doctor or another health worker been concerned about your drinking or suggested you cut down?: Yes, during the last year Alcohol Use Disorder Identification Test Final Score (AUDIT): 13 Alcohol Brief Interventions/Follow-up: Patient Refused Substance Abuse History in the last 12 months:  Yes.    Consequences of Substance Abuse: Medical Consequences:  See chart Previous Psychotropic Medications: Yes  Psychological Evaluations: Yes  Past Medical History:  Past Medical History:  Diagnosis Date   Acute bilateral low back pain without sciatica 02/27/2021   Asthma    CAD (coronary artery disease)    Cataract    CKD (chronic kidney disease)    Cocaine use disorder, mild, abuse (HCC)    COPD (chronic obstructive pulmonary disease) (HCC)    Coronary vasospasm ([place]) 10/07/2020   Depression    Elevated serum creatinine 08/28/2019   Homelessness 06/19/2020   Hypertension    NSTEMI (non-ST elevated myocardial infarction) [facility])    Status post incision and drainage 04/22/2021   Suicidal ideation 06/05/2020    Past Surgical History:  Procedure Laterality Date   APPENDECTOMY     EYE SURGERY     LEFT HEART CATH AND CORONARY ANGIOGRAPHY N/A 06/05/2020   Procedure: LEFT HEART CATH AND CORONARY ANGIOGRAPHY;  Surgeon: Nigel Mormon, MD;  Location: [facility];  Service: Cardiovascular;  Laterality: N/A;   PROSTATE SURGERY     Family History:  Family History  Problem  Relation Age of Onset   Hypertension Mother    Diabetes Mother    Hypertension Father    Hypertension Sister    Family Psychiatric  History: Unremarkable Tobacco Screening:  Social History   Tobacco Use  Smoking Status Some Days   Types: Cigarettes   Last attempt to quit: 12/25/2020   Years since quitting: 2.0  Smokeless Tobacco Never  Tobacco Comments   Smokes a couple cigarettes every now and then    [place]     Are you interested in Tobacco Cessation Medications?  No, patient refused Counseled patient on smoking cessation:  Refused/Declined practical counseling Reason Tobacco Screening Not Completed: No value filed.       Social History:  Social History   Substance and Sexual Activity  Alcohol Use Yes   Comment: drinks 2x/wk, 2-3 40 oz drinks, last drink 2 weeks ago     Social  History   Substance and Sexual Activity  Drug Use Not Currently   Types: Cocaine   Comment: cocaine use once a month, last use 6 mos ago    Additional Social History:                           Allergies:   Allergies  Allergen Reactions   Shellfish Allergy Hives and Rash   Shellfish-Derived Products Rash    Other reaction(s): Other (See Comments)   Lab Results:  Results for orders placed or performed during the hospital encounter of 01/10/23 (from the past 48 hour(s))  CBC     Status: Abnormal   Collection Time: 01/10/23  1:30 PM  Result Value Ref Range   WBC 8.1 4.0 - 10.5 K/uL   RBC 4.16 (L) 4.22 - 5.81 MIL/uL   Hemoglobin 12.5 (L) 13.0 - 17.0 g/dL   HCT 39.0 39.0 - 52.0 %   MCV 93.8 80.0 - 100.0 fL   MCH 30.0 26.0 - 34.0 pg   MCHC 32.1 30.0 - 36.0 g/dL   RDW 12.5 11.5 - 15.5 %   Platelets 266 150 - 400 K/uL   nRBC 0.0 0.0 - 0.2 %    Comment: Performed at [facility], [place] [address] [place], [place] [address]  Comprehensive metabolic panel     Status: Abnormal   Collection Time: 01/10/23  1:30 PM  Result Value Ref Range   Sodium 136 135 - 145 mmol/L   Potassium 3.4 (L) 3.5 - 5.1 mmol/L   Chloride 105 98 - 111 mmol/L   CO2 20 (L) 22 - 32 mmol/L   Glucose, Bld 117 (H) 70 - 99 mg/dL    Comment: Glucose reference range applies only to samples taken after fasting for at least 8 hours.   BUN 10 8 - 23 mg/dL   Creatinine, Ser 1.13 0.61 - 1.24 mg/dL   Calcium 8.8 (L) 8.9 - 10.3 mg/dL   Total Protein 6.7 6.5 - 8.1 g/dL   Albumin 3.5 3.5 - 5.0 g/dL   AST 18 15 - 41 U/L   ALT 15 0 - 44 U/L   Alkaline Phosphatase 65 38 - 126 U/L   Total Bilirubin 0.6 0.3 - 1.2 mg/dL   GFR, Estimated >60 >60 mL/min    Comment: (NOTE) Calculated using the CKD-EPI Creatinine Equation (2021)    Anion gap 11 5 - 15    Comment: Performed at [facility] [address], [place], [place] [address]  Ethanol  Status: None   Collection Time: 01/10/23  1:30 PM  Result  Value Ref Range   Alcohol, Ethyl (B) <10 <10 mg/dL    Comment: (NOTE) Lowest detectable limit for serum alcohol is 10 mg/dL.  For medical purposes only. Performed at [facility], [place] [address] [place], [place] [id number]   Salicylate level     Status: Abnormal   Collection Time: 01/10/23  1:30 PM  Result Value Ref Range   Salicylate Lvl [id number] (L) 7.0 - 30.0 mg/dL    Comment: Performed at [facility] [address] [place], [place] [address]  Acetaminophen level     Status: Abnormal   Collection Time: 01/10/23  1:30 PM  Result Value Ref Range   Acetaminophen (Tylenol), Serum <10 (L) 10 - 30 ug/mL    Comment: (NOTE) Therapeutic concentrations vary significantly. A range of 10-30 ug/mL  may be an effective concentration for many patients. However, some  are best treated at concentrations outside of this range. Acetaminophen concentrations >150 ug/mL at 4 hours after ingestion  and >50 ug/mL at 12 hours after ingestion are often associated with  toxic reactions.  Performed at [facility], [place] [address] [place], [place] [address]   Troponin I (High Sensitivity)     Status: None   Collection Time: 01/10/23  1:30 PM  Result Value Ref Range   Troponin I (High Sensitivity) 14 <18 ng/L    Comment: (NOTE) Elevated high sensitivity troponin I (hsTnI) values and significant  changes across serial measurements may suggest ACS but many other  chronic and acute conditions are known to elevate hsTnI results.  Refer to the "Links" section for chest pain algorithms and additional  guidance. Performed at [facility], [place] [address], [place], [place] [address]   Troponin I (High Sensitivity)     Status: None   Collection Time: 01/10/23  4:05 PM  Result Value Ref Range   Troponin I (High Sensitivity) 12 <18 ng/L    Comment: (NOTE) Elevated high sensitivity troponin I (hsTnI) values and significant  changes across serial measurements may suggest ACS but many  other  chronic and acute conditions are known to elevate hsTnI results.  Refer to the "Links" section for chest pain algorithms and additional  guidance. Performed at [facility], [place] [address], [place], [place] [address]   Resp panel by RT-PCR (RSV, Flu A&B, Covid) Anterior Nasal Swab     Status: None   Collection Time: 01/10/23  4:54 PM   Specimen: Anterior Nasal Swab  Result Value Ref Range   SARS Coronavirus 2 by RT PCR NEGATIVE NEGATIVE   Influenza A by PCR NEGATIVE NEGATIVE   Influenza B by PCR NEGATIVE NEGATIVE    Comment: (NOTE) The Xpert Xpress SARS-CoV-2/FLU/RSV plus assay is intended as an aid in the diagnosis of influenza from Nasopharyngeal swab specimens and should not be used as a sole basis for treatment. Nasal washings and aspirates are unacceptable for Xpert Xpress SARS-CoV-2/FLU/RSV testing.  Fact Sheet for Patients: EntrepreneurPulse.com.au  Fact Sheet for Healthcare Providers: IncredibleEmployment.be  This test is not yet approved or cleared by the [place] FDA and has been authorized for detection and/or diagnosis of SARS-CoV-2 by FDA under an Emergency Use Authorization (EUA). This EUA will remain in effect (meaning this test can be used) for the duration of the COVID-19 declaration under Section 564(b)(1) of the Act, 21 U.S.C. section 360bbb-3(b)(1), unless the authorization is terminated or revoked.  Resp Syncytial Virus by PCR NEGATIVE NEGATIVE    Comment: (NOTE) Fact Sheet for Patients: EntrepreneurPulse.com.au  Fact Sheet for Healthcare Providers: IncredibleEmployment.be  This test is not yet approved or cleared by the [place] FDA and has been authorized for detection and/or diagnosis of SARS-CoV-2 by FDA under an Emergency Use Authorization (EUA). This EUA will remain in effect (meaning this test can be used) for the duration of the COVID-19  declaration under Section 564(b)(1) of the Act, 21 U.S.C. section 360bbb-3(b)(1), unless the authorization is terminated or revoked.  Performed at [facility], [place] [address] [place], [place] [address]     Blood Alcohol level:  Lab Results  Component Value Date   ETH <10 01/10/2023   ETH <10 [id number]    Metabolic Disorder Labs:  Lab Results  Component Value Date   HGBA1C 4.8 09/07/2020   MPG 91.06 09/07/2020   MPG 99.67 09/25/2019   No results found for: "PROLACTIN" Lab Results  Component Value Date   CHOL 173 05/17/2022   TRIG 89 05/17/2022   HDL 53 05/17/2022   CHOLHDL 3.3 05/17/2022   VLDL 15 09/09/2020   LDLCALC 104 (H) 05/17/2022   LDLCALC 80 09/28/2021    Current Medications: Current Facility-Administered Medications  Medication Dose Route Frequency Provider Last Rate Last Admin   acetaminophen (TYLENOL) tablet 650 mg  650 mg Oral Q6H PRN Parks Ranger, DO       albuterol (PROVENTIL) (2.5 MG/3ML) 0.083% nebulizer solution 2.5 mg  2.5 mg Inhalation Q6H PRN Parks Ranger, DO       alum & mag hydroxide-simeth (MAALOX/MYLANTA) 200-200-20 MG/5ML suspension 30 mL  30 mL Oral Q4H PRN Parks Ranger, DO       atorvastatin (LIPITOR) tablet 80 mg  80 mg Oral Daily Parks Ranger, DO   80 mg at 01/11/23 1053   diltiazem (CARDIZEM CD) 24 hr capsule 240 mg  240 mg Oral Daily Parks Ranger, DO   240 mg at 01/11/23 1053   doxepin (SINEQUAN) capsule 50 mg  50 mg Oral QHS Parks Ranger, DO   50 mg at 01/11/23 0105   [START ON 01/12/2023] influenza vaccine adjuvanted (FLUAD) injection 0.5 mL  0.5 mL Intramuscular Tomorrow-1000 Parks Ranger, DO       latanoprost (XALATAN) 0.005 % ophthalmic solution 1 drop  1 drop Both Eyes QHS Parks Ranger, DO   1 drop at 01/11/23 0111   LORazepam (ATIVAN) tablet 1 mg  1 mg Oral Q4H PRN Parks Ranger, DO       magnesium hydroxide (MILK OF MAGNESIA)  suspension 30 mL  30 mL Oral Daily PRN Parks Ranger, DO       mirtazapine (REMERON) tablet 15 mg  15 mg Oral QHS Joleen Stuckert Edward, DO       mometasone-formoterol [place]) 200-5 MCG/ACT inhaler 2 puff  2 puff Inhalation BID Parks Ranger, DO   2 puff at 01/11/23 0805   OLANZapine (ZYPREXA) tablet 10 mg  10 mg Oral Q6H PRN Parks Ranger, DO       QUEtiapine (SEROQUEL) tablet 50 mg  50 mg Oral QHS Shevelle Smither Percell Miller, DO       senna-docusate (Senokot-S) tablet 2 tablet  2 tablet Oral Q2000 Parks Ranger, DO   2 tablet at 01/11/23 0105   traZODone (DESYREL) tablet 100 mg  100 mg Oral QHS PRN Parks Ranger, DO   100 mg at 01/11/23  0105   PTA Medications: Medications Prior to Admission  Medication Sig Dispense Refill Last Dose   albuterol (VENTOLIN HFA) 108 (90 Base) MCG/ACT inhaler Inhale 1-2 puffs into the lungs every 6 (six) hours as needed for wheezing or shortness of breath. 90 g 2    budesonide-formoterol (SYMBICORT) 160-4.5 MCG/ACT inhaler Inhale 2 puffs into the lungs 2 (two) times daily. 1 each 3    ARIPiprazole (ABILIFY) 10 MG tablet Take 1 tablet (10 mg total) by mouth daily. (Patient not taking: Reported on 11/08/2022) 30 tablet 1    atorvastatin (LIPITOR) 80 MG tablet Take 1 tablet (80 mg total) by mouth daily. 30 tablet 1    diltiazem (CARDIZEM CD) 240 MG 24 hr capsule Take 1 capsule (240 mg total) by mouth daily. (Patient not taking: Reported on 11/08/2022) 30 capsule 1    doxepin (SINEQUAN) 50 MG capsule Take 1 capsule (50 mg total) by mouth at bedtime. (Patient not taking: Reported on 11/08/2022) 30 capsule 1    latanoprost (XALATAN) 0.005 % ophthalmic solution Place 1 drop into both eyes at bedtime. 2.5 mL 1    orphenadrine (NORFLEX) 100 MG tablet Take 1 tablet (100 mg total) by mouth 2 (two) times daily. (Patient not taking: Reported on 10/19/2022) 30 tablet 0    senna-docusate (SENOKOT-S) 8.6-50 MG tablet Take 2 tablets by mouth  daily at 8 pm. (Patient not taking: Reported on 10/19/2022) 60 tablet 1     Musculoskeletal: Strength & Muscle Tone: within normal limits Gait & Station: normal Patient leans: N/A            Psychiatric Specialty Exam:  Presentation  General Appearance:  Appropriate for Environment  Eye Contact: Good  Speech: Clear and Coherent  Speech Volume: Normal  Handedness: Right   Mood and Affect  Mood: Depressed  Affect: Congruent; Flat   Thought Process  Thought Processes: Coherent  Duration of Psychotic Symptoms: Last 2 months Past Diagnosis of Schizophrenia or Psychoactive disorder: No  Descriptions of Associations:Intact  Orientation:Full (Time, Place and Person)  Thought Content:Logical  Hallucinations:Hallucinations: Visual Description of Visual Hallucinations: "shadows"  Ideas of Reference:None  Suicidal Thoughts:Suicidal Thoughts: Yes, Active SI Active Intent and/or Plan: With Intent; With Plan  Homicidal Thoughts:Homicidal Thoughts: No   Sensorium  Memory: Immediate Fair; Recent Fair  Judgment: Fair  Insight: Fair   Materials engineer: Fair  Attention Span: Fair  Recall: [employer] of Knowledge: Fair  Language: Fair   Psychomotor Activity  Psychomotor Activity: Psychomotor Activity: Normal   Assets  Assets: Desire for Improvement; Leisure Time; Physical Health; Resilience   Sleep  Sleep: Sleep: Fair    Physical Exam: Physical Exam Vitals and nursing note reviewed.  Constitutional:      Appearance: Normal appearance. He is normal weight.  HENT:     Head: Normocephalic and atraumatic.     Nose: Nose normal.     Mouth/Throat:     Pharynx: Oropharynx is clear.  Eyes:     Extraocular Movements: Extraocular movements intact.     Pupils: Pupils are equal, round, and reactive to light.  Cardiovascular:     Rate and Rhythm: Normal rate and regular rhythm.     Pulses: Normal pulses.      Heart sounds: Normal heart sounds.  Pulmonary:     Effort: Pulmonary effort is normal.     Breath sounds: Normal breath sounds.  Abdominal:     General: Abdomen is flat. Bowel sounds are normal.  Palpations: Abdomen is soft.  Musculoskeletal:        General: Normal range of motion.     Cervical back: Normal range of motion and neck supple.  Skin:    General: Skin is warm and dry.  Neurological:     General: No focal deficit present.     Mental Status: He is alert and oriented to person, place, and time.  Psychiatric:        Attention and Perception: Attention and perception normal.        Mood and Affect: Mood is depressed. Affect is flat.        Speech: Speech normal.        Behavior: Behavior normal. Behavior is cooperative.        Thought Content: Thought content includes suicidal ideation. Thought content includes suicidal plan.        Cognition and Memory: Cognition and memory normal.        Judgment: Judgment is impulsive.    Review of Systems  Constitutional: Negative.   HENT: Negative.    Eyes: Negative.   Respiratory: Negative.    Cardiovascular: Negative.   Gastrointestinal: Negative.   Genitourinary: Negative.   Musculoskeletal: Negative.   Skin: Negative.   Neurological: Negative.   Endo/Heme/Allergies: Negative.   Psychiatric/Behavioral:  Positive for depression, substance abuse and suicidal ideas. The patient is nervous/anxious and has insomnia.    Blood pressure 133/77, pulse 67, temperature 97.6 F (36.4 C), temperature source Oral, height '6\' 4"'$  (1.93 m), weight 116.6 kg, SpO2 100 %. Body mass index is 31.29 kg/m.  Treatment Plan Summary: Daily contact with patient to assess and evaluate symptoms and progress in treatment, Medication management, and Plan increase Remeron to 15 mg at bedtime.  Continue doxepin at bedtime and change Seroquel to as needed.  Start Abilify 5 mg daily.  Observation Level/Precautions:  15 min  Laboratory:   CBC Chemistry Profile  Psychotherapy:    Medications:    Consultations:    Discharge Concerns:    Estimated LOS:  Other:     Physician Treatment Plan for Primary Diagnosis: MDD (major depressive disorder), recurrent severe, without psychosis ([place]) Long Term Goal(s): Improvement in symptoms so as ready for discharge  Short Term Goals: Ability to identify changes in lifestyle to reduce recurrence of condition will improve, Ability to verbalize feelings will improve, Ability to disclose and discuss suicidal ideas, Ability to demonstrate self-control will improve, Ability to identify and develop effective coping behaviors will improve, Ability to maintain clinical measurements within normal limits will improve, Compliance with prescribed medications will improve, and Ability to identify triggers associated with substance abuse/mental health issues will improve  Physician Treatment Plan for Secondary Diagnosis: Principal Problem:   MDD (major depressive disorder), recurrent severe, without psychosis ([place])    I certify that inpatient services furnished can reasonably be expected to improve the patient's condition.    Parks Ranger, DO 3/12/202411:01 AM

## 2023-01-11 NOTE — [facility] Counselor (Signed)
CSW made first attempt to complete pt PSA.   Assessment was unable to be completed.   CSW will follow up at another more opportune time for assessment to be completed.   Kalyse Meharg [place], MSW, LCSW-A 3/12/20244:18 PM

## 2023-01-11 NOTE — Progress Notes (Signed)
   01/11/23 0123  Psych Admission Type (Psych Patients Only)  Admission Status Voluntary  Psychosocial Assessment  Patient Complaints Depression;Hyperactivity;Other (Comment);Self-harm thoughts (says being off his meds has made him aggressive)  Eye Contact Fair  Facial Expression Sad  Affect Depressed  Speech Logical/coherent  Interaction Assertive  Motor Activity Slow  Appearance/Hygiene In scrubs  Behavior Characteristics Cooperative;Appropriate to situation  Mood Depressed  Thought Process  Coherency WDL  Content WDL  Delusions None reported or observed  Perception WDL  Hallucination Auditory;Visual (last hallucinations were a couple days ago - heard echoes and saw shadows)  Judgment Poor  Confusion None  Danger to Others  Danger to Others None reported or observed

## 2023-01-11 NOTE — Group Note (Signed)
Date:  01/11/2023 Time:  11:05 AM  Group Topic/Focus:  Managing Feelings:   The focus of this group is to identify what feelings patients have difficulty handling and develop a plan to handle them in a healthier way upon discharge.    Participation Level:  Did Not Attend  Participation Quality:  Did not Attend  Affect:    Cognitive:  Alert  Insight: None  Engagement in Group:  None  Modes of Intervention:    Additional Comments:    Charmian Forbis l Leigh Kaeding 01/11/2023, 11:05 AM

## 2023-01-11 NOTE — Group Note (Signed)
Date:  01/11/2023 Time:  8:49 PM  Group Topic/Focus:  Goals Group:   The focus of this group is to help patients establish daily goals to achieve during treatment and discuss how the patient can incorporate goal setting into their daily lives to aide in recovery. Wrap-Up Group:   The focus of this group is to help patients review their daily goal of treatment and discuss progress on daily workbooks.    Participation Level:  Minimal  Participation Quality:  Inattentive  Affect:  Flat  Cognitive:  Oriented  Insight: Good  Engagement in Group:  Engaged  Modes of Intervention:  Discussion  Additional Comments:  Adult Unit Workbook pg.[id number]  [place] Route 01/11/2023, 8:49 PM

## 2023-01-11 NOTE — BHH Suicide Risk Assessment (Signed)
[facility] Admission Suicide Risk Assessment   Nursing information obtained from:  Patient Demographic factors:  Male, Living alone, Low socioeconomic status Current Mental Status:  Suicidal ideation indicated by patient Loss Factors:  Loss of significant relationship Historical Factors:  Prior suicide attempts, Family history of suicide Risk Reduction Factors:  Positive social support  Total Time spent with patient: 1 hour Principal Problem: MDD (major depressive disorder), recurrent severe, without psychosis ([place]) Diagnosis:  Principal Problem:   MDD (major depressive disorder), recurrent severe, without psychosis ([place])  Subjective Data: Robert Chan is a 67 y.o. male patient who voluntarily presented to [facility] due to worsening chest pain and depressive symptoms. Pt requesting to speak with psychiatry to restart psychotropic medications. He expresses suicidal ideations with active plan to overdose. Pt reports all three of his brothers committed suicide, and he is the last sibling alive. He reports he does not want to kill himself like his brothers did. Pt has had no psychiatric OP follow up since discharge from IP admission at [facility] in October of 2023.   Continued Clinical Symptoms:  Alcohol Use Disorder Identification Test Final Score (AUDIT): 13 The "Alcohol Use Disorders Identification Test", Guidelines for Use in Primary Care, Second Edition.  World Pharmacologist [facility]). Score between 0-7:  no or low risk or alcohol related problems. Score between 8-15:  moderate risk of alcohol related problems. Score between 16-19:  high risk of alcohol related problems. Score 20 or above:  warrants further diagnostic evaluation for alcohol dependence and treatment.   CLINICAL FACTORS:   Depression:   Hopelessness Alcohol/Substance Abuse/Dependencies Previous Psychiatric Diagnoses and Treatments Medical Diagnoses and Treatments/Surgeries   Musculoskeletal: Strength & Muscle Tone: within normal  limits Gait & Station: normal Patient leans: N/A  Psychiatric Specialty Exam:  Presentation  General Appearance:  Appropriate for Environment  Eye Contact: Good  Speech: Clear and Coherent  Speech Volume: Normal  Handedness: Right   Mood and Affect  Mood: Depressed  Affect: Congruent; Flat   Thought Process  Thought Processes: Coherent  Descriptions of Associations:Intact  Orientation:Full (Time, Place and Person)  Thought Content:Logical  History of Schizophrenia/Schizoaffective disorder:No  Duration of Psychotic Symptoms:N/A  Hallucinations:Hallucinations: Visual Description of Visual Hallucinations: "shadows"  Ideas of Reference:None  Suicidal Thoughts:Suicidal Thoughts: Yes, Active SI Active Intent and/or Plan: With Intent; With Plan  Homicidal Thoughts:Homicidal Thoughts: No   Sensorium  Memory: Immediate Fair; Recent Fair  Judgment: Fair  Insight: Fair   Materials engineer: Fair  Attention Span: Fair  Recall: [employer] of Knowledge: Fair  Language: Fair   Psychomotor Activity  Psychomotor Activity: Psychomotor Activity: Normal   Assets  Assets: Desire for Improvement; Leisure Time; Physical Health; Resilience   Sleep  Sleep: Sleep: Fair     Blood pressure 133/77, pulse 67, temperature 97.6 F (36.4 C), temperature source Oral, height '6\' 4"'$  (1.93 m), weight 116.6 kg, SpO2 100 %. Body mass index is 31.29 kg/m.   COGNITIVE FEATURES THAT CONTRIBUTE TO RISK:  None    SUICIDE RISK:   Minimal: No identifiable suicidal ideation.  Patients presenting with no risk factors but with morbid ruminations; may be classified as minimal risk based on the severity of the depressive symptoms  PLAN OF CARE: See orders  I certify that inpatient services furnished can reasonably be expected to improve the patient's condition.   [place], DO 01/11/2023, 11:00 AM

## 2023-01-11 NOTE — Group Note (Signed)
LCSW Group Therapy Note  Group Date: 01/11/2023 Start Time: 1305 End Time: 1400   Type of Therapy and Topic:  Group Therapy - How To Cope with Nervousness about Discharge   Participation Level:  Did Not Attend   Description of Group This process group involved identification of patients' feelings about discharge. Some of them are scheduled to be discharged soon, while others are new admissions, but each of them was asked to share thoughts and feelings surrounding discharge from the hospital. One common theme was that they are excited at the prospect of going home, while another was that many of them are apprehensive about sharing why they were hospitalized. Patients were given the opportunity to discuss these feelings with their peers in preparation for discharge.  Therapeutic Goals  Patient will identify their overall feelings about pending discharge. Patient will think about how they might proactively address issues that they believe will once again arise once they get home (i.e. with parents). Patients will participate in discussion about having hope for change.   Summary of Patient Progress:   X   Therapeutic Modalities Cognitive Behavioral Therapy   [place] A [place], LCSWA 01/11/2023  2:20 PM

## 2023-01-12 ENCOUNTER — Inpatient Hospital Stay: Payer: Medicare HMO | Admitting: Critical Care Medicine

## 2023-01-12 DIAGNOSIS — F332 Major depressive disorder, recurrent severe without psychotic features: Secondary | ICD-10-CM | POA: Diagnosis not present

## 2023-01-12 MED ORDER — SENNOSIDES-DOCUSATE SODIUM 8.6-50 MG PO TABS
2.0000 | ORAL_TABLET | Freq: Every day | ORAL | Status: DC
  Administered 2023-01-12 – 2023-01-21 (×10): 2 via ORAL
  Filled 2023-01-12 (×10): qty 2

## 2023-01-12 NOTE — Progress Notes (Signed)
Influenza vaccine administered IM in L deltoid at 1315. Patient tolerated well. Patient education performed and informative print out provided.

## 2023-01-12 NOTE — BHH Suicide Risk Assessment (Signed)
[place] INPATIENT:  Family/Significant Other Suicide Prevention Education  Suicide Prevention Education:  Patient Refusal for Family/Significant Other Suicide Prevention Education: The patient Robert Chan has refused to provide written consent for family/significant other to be provided Family/Significant Other Suicide Prevention Education during admission and/or prior to discharge.  Physician notified.  SPE completed with pt, as pt refused to consent to family contact. SPI pamphlet provided to pt and pt was encouraged to share information with support network, ask questions, and talk about any concerns relating to SPE. Pt denies access to guns/firearms and verbalized understanding of information provided. Mobile Crisis information also provided to pt.   Stachia Slutsky A [place] 01/12/2023, 1:54 PM

## 2023-01-12 NOTE — Plan of Care (Signed)
  Problem: Education: Goal: Knowledge of General Education information will improve Description: Including pain rating scale, medication(s)/side effects and non-pharmacologic comfort measures Outcome: Progressing   Problem: Elimination: Goal: Will not experience complications related to bowel motility Outcome: Not Progressing   Problem: Education: Goal: Mental status will improve Outcome: Not Progressing

## 2023-01-12 NOTE — Plan of Care (Signed)
Goal set as of 01/12/23   Problem: Group Participation Goal: STG - Patient will engage in groups without prompting or encouragement from LRT x3 group sessions within 5 recreation therapy group sessions Description: STG - Patient will engage in groups without prompting or encouragement from LRT x3 group sessions within 5 recreation therapy group sessions Outcome: Not Progressing   Rosene Pilling LRT, CTRS

## 2023-01-12 NOTE — Group Note (Signed)
Recreation Therapy Group Note   Group Topic:Coping Skills  Group Date: 01/12/2023 Start Time: 1400 End Time: 1455 Facilitators: Vilma Prader, LRT, CTRS Location: Courtyard  Group Description: Journaling. Patient was given a packet with 20 different journaling prompts on it. Pt was encouraged to pick 3-5 different prompts to complete on lined paper provided. LRT and pt discussed the benefits of journaling and how it can be implemented post discharge. After journaling, patients chose to listen to music and play cards while enjoying fresh air and sunlight.   Affect/Mood: Appropriate, Blunted, and Flat   Participation Level: Minimal   Participation Quality: Minimal Cues   Behavior: Appropriate and Disinterested   Speech/Thought Process: Coherent   Insight: Limited   Judgement: Moderate   Modes of Intervention: Activity and Education   Patient Response to Interventions:  Disengaged   Education Outcome:  In group clarification offered    Clinical Observations/Individualized Feedback: Robert Chan was minimally active in their participation of session activities and group discussion. Pt chose not to journal or play cards with peers. Pt sat in the back alone and listened to the music playing aloud, despite LRT and peer encouragement to join. Pt was respectful and appropriate while in group.    Plan: Continue to engage patient in RT group sessions 2-3x/week.   Vilma Prader, LRT, CTRS 01/12/2023 3:22 PM

## 2023-01-12 NOTE — Group Note (Signed)
Date:  01/12/2023 Time:  9:19 PM  Group Topic/Focus:  Goals Group:   The focus of this group is to help patients establish daily goals to achieve during treatment and discuss how the patient can incorporate goal setting into their daily lives to aide in recovery.    Participation Level:  Active  Participation Quality:  Appropriate and Attentive  Affect:  Appropriate and Excited  Cognitive:  Alert, Appropriate, and Oriented  Insight: Appropriate and Good  Engagement in Group:  Engaged  Modes of Intervention:  Activity, Problem-solving, Socialization, and Support  Additional Comments:    Bradd Canary 01/12/2023, 9:19 PM

## 2023-01-12 NOTE — Progress Notes (Signed)
   01/11/23 2100  Psych Admission Type (Psych Patients Only)  Admission Status Voluntary  Psychosocial Assessment  Patient Complaints Anxiety;Depression  Eye Contact Fair  Facial Expression Flat  Affect Depressed;Sad  Speech Logical/coherent  Interaction Minimal;Assertive  Motor Activity Slow  Appearance/Hygiene In scrubs  Behavior Characteristics Cooperative  Mood Depressed  Thought Process  Coherency WDL  Content WDL  Delusions None reported or observed  Perception WDL  Hallucination None reported or observed  Judgment Poor  Confusion None  Danger to Self  Current suicidal ideation? Denies  Agreement Not to Harm Self Yes  Description of Agreement Verbal  Danger to Others  Danger to Others None reported or observed

## 2023-01-12 NOTE — Plan of Care (Signed)
  Problem: Activity: Goal: Risk for activity intolerance will decrease Outcome: Progressing   Problem: Coping: Goal: Level of anxiety will decrease Outcome: Progressing   Problem: Elimination: Goal: Will not experience complications related to bowel motility Outcome: Progressing   Problem: Safety: Goal: Ability to remain free from injury will improve Outcome: Progressing   Problem: Education: Goal: Emotional status will improve Outcome: Progressing Goal: Mental status will improve Outcome: Progressing

## 2023-01-12 NOTE — BH IP Treatment Plan (Signed)
Interdisciplinary Treatment and Diagnostic Plan Update  01/12/2023 Time of Session: 9:30AM Robert Chan MRN: [name]  Principal Diagnosis: MDD (major depressive disorder), recurrent severe, without psychosis ([place])  Secondary Diagnoses: Principal Problem:   MDD (major depressive disorder), recurrent severe, without psychosis ([place])   Current Medications:  Current Facility-Administered Medications  Medication Dose Route Frequency Provider Last Rate Last Admin   acetaminophen (TYLENOL) tablet 650 mg  650 mg Oral Q6H PRN Parks Ranger, DO       albuterol (PROVENTIL) (2.5 MG/3ML) 0.083% nebulizer solution 2.5 mg  2.5 mg Inhalation Q6H PRN Parks Ranger, DO       alum & mag hydroxide-simeth (MAALOX/MYLANTA) 200-200-20 MG/5ML suspension 30 mL  30 mL Oral Q4H PRN Parks Ranger, DO       ARIPiprazole (ABILIFY) tablet 5 mg  5 mg Oral Daily Parks Ranger, DO   5 mg at 01/12/23 1102   atorvastatin (LIPITOR) tablet 80 mg  80 mg Oral Daily Parks Ranger, DO   80 mg at 01/12/23 1102   diltiazem (CARDIZEM CD) 24 hr capsule 240 mg  240 mg Oral Daily Parks Ranger, DO   240 mg at 01/12/23 1102   doxepin (SINEQUAN) capsule 50 mg  50 mg Oral QHS Parks Ranger, DO   50 mg at 01/11/23 2106   latanoprost (XALATAN) 0.005 % ophthalmic solution 1 drop  1 drop Both Eyes QHS Parks Ranger, DO   1 drop at 01/11/23 2106   LORazepam (ATIVAN) tablet 1 mg  1 mg Oral Q4H PRN Parks Ranger, DO       magnesium hydroxide (MILK OF MAGNESIA) suspension 30 mL  30 mL Oral Daily PRN Parks Ranger, DO   30 mL at 01/11/23 1749   mirtazapine (REMERON) tablet 15 mg  15 mg Oral QHS Parks Ranger, DO   15 mg at 01/11/23 2106   mometasone-formoterol (DULERA) 200-5 MCG/ACT inhaler 2 puff  2 puff Inhalation BID Parks Ranger, DO   2 puff at 01/12/23 0749   OLANZapine (ZYPREXA) tablet 10 mg  10 mg Oral Q6H PRN Parks Ranger, DO       QUEtiapine (SEROQUEL) tablet 100 mg  100 mg Oral QHS PRN Parks Ranger, DO   100 mg at 01/11/23 2106   senna-docusate (Senokot-S) tablet 2 tablet  2 tablet Oral Daily Parks Ranger, DO   2 tablet at 01/12/23 1102   PTA Medications: Medications Prior to Admission  Medication Sig Dispense Refill Last Dose   albuterol (VENTOLIN HFA) 108 (90 Base) MCG/ACT inhaler Inhale 1-2 puffs into the lungs every 6 (six) hours as needed for wheezing or shortness of breath. 90 g 2    budesonide-formoterol (SYMBICORT) 160-4.5 MCG/ACT inhaler Inhale 2 puffs into the lungs 2 (two) times daily. 1 each 3    ARIPiprazole (ABILIFY) 10 MG tablet Take 1 tablet (10 mg total) by mouth daily. (Patient not taking: Reported on 11/08/2022) 30 tablet 1    atorvastatin (LIPITOR) 80 MG tablet Take 1 tablet (80 mg total) by mouth daily. 30 tablet 1    diltiazem (CARDIZEM CD) 240 MG 24 hr capsule Take 1 capsule (240 mg total) by mouth daily. (Patient not taking: Reported on 11/08/2022) 30 capsule 1    doxepin (SINEQUAN) 50 MG capsule Take 1 capsule (50 mg total) by mouth at bedtime. (Patient not taking: Reported on 11/08/2022) 30 capsule 1    latanoprost (XALATAN) 0.005 % ophthalmic solution Place  1 drop into both eyes at bedtime. 2.5 mL 1    orphenadrine (NORFLEX) 100 MG tablet Take 1 tablet (100 mg total) by mouth 2 (two) times daily. (Patient not taking: Reported on 10/19/2022) 30 tablet 0    senna-docusate (SENOKOT-S) 8.6-50 MG tablet Take 2 tablets by mouth daily at 8 pm. (Patient not taking: Reported on 10/19/2022) 60 tablet 1     Patient Stressors: Health problems   Loss of 3 brothers to suicide   Medication change or noncompliance    Patient Strengths: Average or above average intelligence  Capable of independent living  Motivation for treatment/growth   Treatment Modalities: Medication Management, Group therapy, Case management,  1 to 1 session with clinician, Psychoeducation,  Recreational therapy.   Physician Treatment Plan for Primary Diagnosis: MDD (major depressive disorder), recurrent severe, without psychosis ([place]) Long Term Goal(s): Improvement in symptoms so as ready for discharge   Short Term Goals: Ability to identify changes in lifestyle to reduce recurrence of condition will improve Ability to verbalize feelings will improve Ability to disclose and discuss suicidal ideas Ability to demonstrate self-control will improve Ability to identify and develop effective coping behaviors will improve Ability to maintain clinical measurements within normal limits will improve Compliance with prescribed medications will improve Ability to identify triggers associated with substance abuse/mental health issues will improve  Medication Management: Evaluate patient's response, side effects, and tolerance of medication regimen.  Therapeutic Interventions: 1 to 1 sessions, Unit Group sessions and Medication administration.  Evaluation of Outcomes: Not Met  Physician Treatment Plan for Secondary Diagnosis: Principal Problem:   MDD (major depressive disorder), recurrent severe, without psychosis ([place])  Long Term Goal(s): Improvement in symptoms so as ready for discharge   Short Term Goals: Ability to identify changes in lifestyle to reduce recurrence of condition will improve Ability to verbalize feelings will improve Ability to disclose and discuss suicidal ideas Ability to demonstrate self-control will improve Ability to identify and develop effective coping behaviors will improve Ability to maintain clinical measurements within normal limits will improve Compliance with prescribed medications will improve Ability to identify triggers associated with substance abuse/mental health issues will improve     Medication Management: Evaluate patient's response, side effects, and tolerance of medication regimen.  Therapeutic Interventions: 1 to 1 sessions, Unit Group  sessions and Medication administration.  Evaluation of Outcomes: Not Met   RN Treatment Plan for Primary Diagnosis: MDD (major depressive disorder), recurrent severe, without psychosis ([place]) Long Term Goal(s): Knowledge of disease and therapeutic regimen to maintain health will improve  Short Term Goals: Ability to remain free from injury will improve, Ability to verbalize frustration and anger appropriately will improve, Ability to demonstrate self-control, Ability to participate in decision making will improve, Ability to verbalize feelings will improve, Ability to disclose and discuss suicidal ideas, Ability to identify and develop effective coping behaviors will improve, and Compliance with prescribed medications will improve  Medication Management: RN will administer medications as ordered by provider, will assess and evaluate patient's response and provide education to patient for prescribed medication. RN will report any adverse and/or side effects to prescribing provider.  Therapeutic Interventions: 1 on 1 counseling sessions, Psychoeducation, Medication administration, Evaluate responses to treatment, Monitor vital signs and CBGs as ordered, Perform/monitor CIWA, COWS, AIMS and Fall Risk screenings as ordered, Perform wound care treatments as ordered.  Evaluation of Outcomes: Not Met   LCSW Treatment Plan for Primary Diagnosis: MDD (major depressive disorder), recurrent severe, without psychosis ([place]) Long Term Goal(s): Safe  transition to appropriate next level of care at discharge, Engage patient in therapeutic group addressing interpersonal concerns.  Short Term Goals: Engage patient in aftercare planning with referrals and resources, Increase social support, Increase ability to appropriately verbalize feelings, Increase emotional regulation, Facilitate acceptance of mental health diagnosis and concerns, Identify triggers associated with mental health/substance abuse issues, and  Increase skills for wellness and recovery  Therapeutic Interventions: Assess for all discharge needs, 1 to 1 time with Social worker, Explore available resources and support systems, Assess for adequacy in community support network, Educate family and significant other(s) on suicide prevention, Complete Psychosocial Assessment, Interpersonal group therapy.  Evaluation of Outcomes: Not Met   Progress in Treatment: Attending groups: No. Participating in groups: No. Taking medication as prescribed: Yes. Toleration medication: Yes. Family/Significant other contact made: Yes, individual(s) contacted:  Pt declined collateral contact. SPE completed with pt Patient understands diagnosis: Yes. Discussing patient identified problems/goals with staff: Yes. Medical problems stabilized or resolved: Yes. Denies suicidal/homicidal ideation: No. Issues/concerns per patient self-inventory: No. Other: None  New problem(s) identified: No, Describe:  None  New Short Term/Long Term Goal(s): Patient to work towards detox, elimination of symptoms of psychosis, medication management for mood stabilization; elimination of SI thoughts; development of comprehensive mental wellness/sobriety plan.   Patient Goals:  "wanna get my medications right, so my mind will be ok"  Discharge Plan or Barriers: CSW will assist pt with development of appropriate discharge/aftercare plan.   Reason for Continuation of Hospitalization: Depression Medication stabilization Suicidal ideation  Estimated Length of Stay: 1-7 days  Last 3 [place] Suicide Severity Risk Score: [place] Admission (Current) from 01/10/2023 in [place] Most recent reading at 01/10/2023 11:39 PM ED from 01/10/2023 in [facility] Department at [facility] Most recent reading at 01/10/2023  1:13 PM ED from 11/08/2022 in [facility] at [facility] Most recent reading at 11/08/2022  4:06  PM  C-SSRS RISK CATEGORY High Risk High Risk High Risk       Last PHQ 2/9 Scores:    10/19/2022    2:53 PM 05/11/2022    2:30 PM 03/31/2022    2:15 PM  Depression screen PHQ 2/9  Decreased Interest 0 1 0  Down, Depressed, Hopeless 0 1 0  PHQ - 2 Score 0 2 0  Altered sleeping  0   Tired, decreased energy  1   Change in appetite  1   Trouble concentrating  1   Moving slowly or fidgety/restless  0   Suicidal thoughts  0   PHQ-9 Score  5     Scribe for Treatment Team: Naheim Burgen A [place], Latanya Presser 01/12/2023 1:56 PM
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
## 2023-01-12 NOTE — BH Assessment (Signed)
Recreation Therapy Notes  INPATIENT RECREATION THERAPY ASSESSMENT  Patient Details Name: Robert Chan MRN: [name] DOB: Oct 19, 1956 Today's Date: 01/12/2023       Information Obtained From: Patient (In addition to chart review.)  Able to Participate in Assessment/Interview: Yes  Patient Presentation: Withdrawn, Resistant, Anxious  Reason for Admission (Per Patient): Med Non-Compliance, Active Symptoms ("I just need to get back on my medicine. I was feeling depressed.")  Patient Stressors: Death ("I lost a lot of people I should not have".)  Coping Skills:    ("I don't know...meds help a little".)  Leisure Interests (2+):  Exercise - Journalist, newspaper, Individual - Other (Comment), Social - Family, Community - Other (Comment) ("I go to [employer] and do the machines there. I also have a daughter and grandbabies I like to spend time with. I just sit in the house, too".)  Frequency of Recreation/Participation: Weekly  Awareness of Community Resources:  Yes  Community Resources:  [employer]  Current Use: No  If no, Barriers?:    Expressed Interest in [employer] Information: No  County of Residence:  "I like in [place]".  Patient Main Form of Transportation: [employer] ("I walk or take the bus".)  Patient Strengths:  "I take care of my grandkids like I am suppose to. I play ball and go to the gym sometimes".  Patient Identified Areas of Improvement:  "My behavior. Stay on meds and not have down spots." (LRT prompted further explanation and pt shared that he does not always stay on his medication due to how they make him feel and some of the side effects from them.)  Patient Goal for Hospitalization:  "Get new medidicne that makes me feel better."  Current SI (including self-harm):  No  Current HI:  No  Current AVH: No  Staff Intervention Plan: Group Attendance, Collaborate with Interdisciplinary Treatment Team  Consent to Intern  Participation: N/A   Juline Patch LRT, [place] 01/12/2023, 3:50 PM

## 2023-01-12 NOTE — Progress Notes (Signed)
Patient is A+O x 4. He denies anxiety but endorses depression. He denies SI/HI/AVH. Appetite good. Medication compliant.  Senna added to scheduled medications. Patient reported that he has not had a bowel movement in 4 days.  Hemoglobin A1c and lipid panel scheduled for tomorrow morning 03.14.23.  Q15 minute unit checks in place.

## 2023-01-13 DIAGNOSIS — F332 Major depressive disorder, recurrent severe without psychotic features: Secondary | ICD-10-CM | POA: Diagnosis not present

## 2023-01-13 LAB — LIPID PANEL
Cholesterol: 168 mg/dL (ref 0–200)
HDL: 57 mg/dL (ref >40)
LDL Cholesterol: 99 mg/dL (ref 0–99)
Total CHOL/HDL Ratio: 2.9 RATIO
Triglycerides: 62 mg/dL (ref <150)
VLDL: 12 mg/dL (ref 0–40)

## 2023-01-13 MED ORDER — ARIPIPRAZOLE 5 MG PO TABS
10.0000 mg | ORAL_TABLET | Freq: Every day | ORAL | Status: DC
  Administered 2023-01-14 – 2023-01-17 (×4): 10 mg via ORAL
  Filled 2023-01-13 (×4): qty 2

## 2023-01-13 NOTE — Progress Notes (Signed)
[place] [facility] MD Progress Note  01/13/2023 1:04 PM Robert Chan  MRN:  [id number] Subjective: Robert Chan is seen on rounds.  He does not have any complaints.  Nurses report no issues.  He complains of depressed mood.  He also has some anxiety.  He has a history of substance abuse but not recently.  We discussed going up on his Abilify.  His hemoglobin A1c and lipid panel were within normal limits.  Principal Problem: MDD (major depressive disorder), recurrent severe, without psychosis ([place]) Diagnosis: Principal Problem:   MDD (major depressive disorder), recurrent severe, without psychosis ([place])  Total Time spent with patient: 15 minutes  Past Psychiatric History: History of depression and polysubstance abuse.  Past Medical History:  Past Medical History:  Diagnosis Date   Acute bilateral low back pain without sciatica 02/27/2021   Asthma    CAD (coronary artery disease)    Cataract    CKD (chronic kidney disease)    Cocaine use disorder, mild, abuse (HCC)    COPD (chronic obstructive pulmonary disease) (HCC)    Coronary vasospasm ([place]) 10/07/2020   Depression    Elevated serum creatinine 08/28/2019   Homelessness 06/19/2020   Hypertension    NSTEMI (non-ST elevated myocardial infarction) [facility])    Status post incision and drainage 04/22/2021   Suicidal ideation 06/05/2020    Past Surgical History:  Procedure Laterality Date   APPENDECTOMY     EYE SURGERY     LEFT HEART CATH AND CORONARY ANGIOGRAPHY N/A 06/05/2020   Procedure: LEFT HEART CATH AND CORONARY ANGIOGRAPHY;  Surgeon: Nigel Mormon, MD;  Location: [facility];  Service: Cardiovascular;  Laterality: N/A;   PROSTATE SURGERY     Family History:  Family History  Problem Relation Age of Onset   Hypertension Mother    Diabetes Mother    Hypertension Father    Hypertension Sister    Family Psychiatric  History: Unremarkable Social History:  Social History   Substance and Sexual Activity  Alcohol Use Yes   Comment:  drinks 2x/wk, 2-3 40 oz drinks, last drink 2 weeks ago     Social History   Substance and Sexual Activity  Drug Use Not Currently   Types: Cocaine   Comment: cocaine use once a month, last use 6 mos ago    Social History   Socioeconomic History   Marital status: Single    Spouse name: Not on file   Number of children: Not on file   Years of education: Not on file   Highest education level: Not on file  Occupational History   Not on file  Tobacco Use   Smoking status: Some Days    Types: Cigarettes    Last attempt to quit: 12/25/2020    Years since quitting: 2.0   Smokeless tobacco: Never   Tobacco comments:    Smokes a couple cigarettes every now and then  Vaping Use   Vaping Use: Never used  Substance and Sexual Activity   Alcohol use: Yes    Comment: drinks 2x/wk, 2-3 40 oz drinks, last drink 2 weeks ago   Drug use: Not Currently    Types: Cocaine    Comment: cocaine use once a month, last use 6 mos ago   Sexual activity: Not Currently  Other Topics Concern   Not on file  Social History Narrative   His mother passed at age 57 (when he was 17 years old) and his father raised him and his siblings   His parents  were pastors as a lot of his sisters and other family members are today   There was a total of 13 children in his family Had 5 sisters, one sister deceased , Had 3 brothers, all brothers deceased   He is the youngest of the 72 and was "always in trouble in my younger years"   Family still live in [place], [place], some in [place], [place]   Has a Daughter and son with a total of 10 grandchildren (62 grand daughters local and 42 grandsons)    Values family   Married twice, present wife incarcerated   Social Determinants of Health   Financial Resource Strain: [place]  (10/19/2022)   Overall Financial Resource Strain (CARDIA)    Difficulty of Paying Living Expenses: Not hard at all  Food Insecurity: No Food Insecurity (01/11/2023)   Hunger Vital Sign    Worried  About Running Out of Food in the Last Year: Never true    [place] in the Last Year: Never true  Transportation Needs: No Transportation Needs (01/11/2023)   PRAPARE - Hydrologist (Medical): No    Lack of Transportation (Non-Medical): No  Physical Activity: Inactive (10/19/2022)   Exercise Vital Sign    Days of Exercise per Week: 0 days    Minutes of Exercise per Session: 0 min  Stress: No Stress Concern Present (10/19/2022)   [place]    Feeling of Stress : Not at all  Social Connections: Moderately Integrated (03/31/2022)   Social Connection and Isolation Panel [NHANES]    Frequency of Communication with Friends and Family: Three times a week    Frequency of Social Gatherings with Friends and Family: Three times a week    Attends Religious Services: 1 to 4 times per year    Active Member of Clubs or Organizations: Yes    Attends Archivist Meetings: 1 to 4 times per year    Marital Status: Never married   Additional Social History:                         Sleep: Good  Appetite:  Good  Current Medications: Current Facility-Administered Medications  Medication Dose Route Frequency Provider Last Rate Last Admin   acetaminophen (TYLENOL) tablet 650 mg  650 mg Oral Q6H PRN Parks Ranger, DO       albuterol (PROVENTIL) (2.5 MG/3ML) 0.083% nebulizer solution 2.5 mg  2.5 mg Inhalation Q6H PRN Parks Ranger, DO       alum & mag hydroxide-simeth (MAALOX/MYLANTA) 200-200-20 MG/5ML suspension 30 mL  30 mL Oral Q4H PRN Parks Ranger, DO       ARIPiprazole (ABILIFY) tablet 5 mg  5 mg Oral Daily Parks Ranger, DO   5 mg at 01/13/23 1034   atorvastatin (LIPITOR) tablet 80 mg  80 mg Oral Daily Parks Ranger, DO   80 mg at 01/13/23 1034   diltiazem (CARDIZEM CD) 24 hr capsule 240 mg  240 mg Oral Daily Parks Ranger,  DO   240 mg at 01/13/23 1034   doxepin (SINEQUAN) capsule 50 mg  50 mg Oral QHS Parks Ranger, DO   50 mg at 01/12/23 2108   latanoprost (XALATAN) 0.005 % ophthalmic solution 1 drop  1 drop Both Eyes QHS Parks Ranger, DO   1 drop at 01/12/23 2107   LORazepam (ATIVAN)  tablet 1 mg  1 mg Oral Q4H PRN Parks Ranger, DO       magnesium hydroxide (MILK OF MAGNESIA) suspension 30 mL  30 mL Oral Daily PRN Parks Ranger, DO   30 mL at 01/11/23 1749   mirtazapine (REMERON) tablet 15 mg  15 mg Oral QHS Parks Ranger, DO   15 mg at 01/12/23 2108   mometasone-formoterol (DULERA) 200-5 MCG/ACT inhaler 2 puff  2 puff Inhalation BID Parks Ranger, DO   2 puff at 01/13/23 0800   OLANZapine (ZYPREXA) tablet 10 mg  10 mg Oral Q6H PRN Parks Ranger, DO       QUEtiapine (SEROQUEL) tablet 100 mg  100 mg Oral QHS PRN Parks Ranger, DO   100 mg at 01/11/23 2106   senna-docusate (Senokot-S) tablet 2 tablet  2 tablet Oral Daily Parks Ranger, DO   2 tablet at 01/13/23 1034    Lab Results:  Results for orders placed or performed during the hospital encounter of 01/10/23 (from the past 48 hour(s))  Lipid panel     Status: None   Collection Time: 01/13/23  8:21 AM  Result Value Ref Range   Cholesterol 168 0 - 200 mg/dL   Triglycerides 62 <150 mg/dL   HDL 57 >40 mg/dL   Total CHOL/HDL Ratio 2.9 RATIO   VLDL 12 0 - 40 mg/dL   LDL Cholesterol 99 0 - 99 mg/dL    Comment:        Total Cholesterol/HDL:CHD Risk Coronary Heart Disease Risk Table                     Men   Women  1/2 Average Risk   3.4   3.3  Average Risk       5.0   4.4  2 X Average Risk   9.6   7.1  3 X Average Risk  23.4   11.0        Use the calculated Patient Ratio above and the CHD Risk Table to determine the patient's CHD Risk.        ATP III CLASSIFICATION (LDL):  <100     mg/dL   Optimal  100-129  mg/dL   Near or Above                    Optimal   130-159  mg/dL   Borderline  160-189  mg/dL   High  >190     mg/dL   Very High Performed at [facility], [address] [place], [place] [address]     Blood Alcohol level:  Lab Results  Component Value Date   [facility] <10 01/10/2023   ETH <10 [id number]    Metabolic Disorder Labs: Lab Results  Component Value Date   HGBA1C 4.8 09/07/2020   MPG 91.06 09/07/2020   MPG 99.67 09/25/2019   No results found for: "PROLACTIN" Lab Results  Component Value Date   CHOL 168 01/13/2023   TRIG 62 01/13/2023   HDL 57 01/13/2023   CHOLHDL 2.9 01/13/2023   VLDL 12 01/13/2023   LDLCALC 99 01/13/2023   LDLCALC 104 (H) 05/17/2022    Physical Findings: AIMS:  , ,  ,  ,    CIWA:    COWS:     Musculoskeletal: Strength & Muscle Tone: within normal limits Gait & Station: normal Patient leans: N/A  Psychiatric Specialty Exam:  Presentation  General Appearance:  Appropriate for  Environment  Eye Contact: Good  Speech: Clear and Coherent  Speech Volume: Normal  Handedness: Right   Mood and Affect  Mood: Depressed  Affect: Congruent; Flat   Thought Process  Thought Processes: Coherent  Descriptions of Associations:Intact  Orientation:Full (Time, Place and Person)  Thought Content:Logical  History of Schizophrenia/Schizoaffective disorder:No  Duration of Psychotic Symptoms:N/A  Hallucinations:No data recorded Ideas of Reference:None  Suicidal Thoughts:No data recorded Homicidal Thoughts:No data recorded  Sensorium  Memory: Immediate Fair; Recent Fair  Judgment: Fair  Insight: Fair   Community education officer  Concentration: Fair  Attention Span: Fair  Recall: [employer] of Knowledge: Fair  Language: Fair   Psychomotor Activity  Psychomotor Activity:No data recorded  Assets  Assets: Desire for Improvement; Leisure Time; Physical Health; Resilience   Sleep  Sleep:No data recorded   Physical Exam: Physical Exam Vitals  and nursing note reviewed.  Constitutional:      Appearance: Normal appearance. He is normal weight.  Neurological:     General: No focal deficit present.     Mental Status: He is alert and oriented to person, place, and time.  Psychiatric:        Attention and Perception: Attention and perception normal.        Mood and Affect: Mood is depressed. Affect is flat.        Speech: Speech normal.        Behavior: Behavior normal. Behavior is cooperative.        Thought Content: Thought content normal.        Cognition and Memory: Cognition and memory normal.        Judgment: Judgment is impulsive.    Review of Systems  Constitutional: Negative.   HENT: Negative.    Eyes: Negative.   Respiratory: Negative.    Cardiovascular: Negative.   Gastrointestinal: Negative.   Genitourinary: Negative.   Musculoskeletal: Negative.   Skin: Negative.   Neurological: Negative.   Endo/Heme/Allergies: Negative.   Psychiatric/Behavioral:  Positive for depression and substance abuse. The patient is nervous/anxious.    Blood pressure (!) 148/86, pulse 70, temperature 97.7 F (36.5 C), temperature source Oral, resp. rate 16, height '6\' 4"'$  (1.93 m), weight 116.6 kg, SpO2 98 %. Body mass index is 31.29 kg/m.   Treatment Plan Summary: Daily contact with patient to assess and evaluate symptoms and progress in treatment, Medication management, and Plan increase Abilify to 10 mg/day.  Parks Ranger, DO 01/13/2023, 1:04 PM

## 2023-01-13 NOTE — Progress Notes (Signed)
Patient is A+O x 4. He denies SI/HI/AVH. He endorses 2/10 anxiety and 8/10 depression. Affect sad. Mood depressed. Denies pain. Good appetite. Patient is medication compliant.  Patient stated that he had a small bowel movement after not passing stool since March 9th.  Q15 minute unit checks in place.

## 2023-01-13 NOTE — Plan of Care (Signed)
  Problem: Nutrition: Goal: Adequate nutrition will be maintained Outcome: Progressing   Problem: Coping: Goal: Level of anxiety will decrease Outcome: Progressing   Problem: Elimination: Goal: Will not experience complications related to bowel motility Outcome: Not Progressing   

## 2023-01-13 NOTE — Group Note (Signed)
Date:  01/13/2023 Time:  11:29 PM  Group Topic/Focus:  Goals Group:   The focus of this group is to help patients establish daily goals to achieve during treatment and discuss how the patient can incorporate goal setting into their daily lives to aide in recovery.    Participation Level:  Active  Participation Quality:  Attentive  Affect:  Appropriate  Cognitive:  Alert  Insight: Good  Engagement in Group:  Supportive  Modes of Intervention:  Support  Additional Comments:    Bradd Canary 01/13/2023, 11:29 PM

## 2023-01-13 NOTE — Progress Notes (Signed)
Pt was in the dayroom watching TV at the start of the shift. He is alert and oriented x4, is calm and cooperative, quiet and keeps mostly to himself, doesn't interact with the other patients or with staff. He denied any issues this shift, was compliant with his medications. He has appeared to be sleeping with no distress noted.

## 2023-01-13 NOTE — Group Note (Signed)
Recreation Therapy Group Note   Group Topic:Relaxation  Group Date: 01/13/2023 Start Time: 1400 End Time: 1450 Facilitators: Vilma Prader, LRT, CTRS Location: Courtyard  Group Description: Meditation. LRT and patients discussed what they know about meditation and mindfulness. LRT played a Deep Breathing Meditation exercise script for patients to follow along to. LRT and patients discussed how meditation and deep breathing can be used as a coping skill post--discharge. Afterwards, patients chose to listen to music and enjoy the fresh air and sunshine.   Affect/Mood: Appropriate   Participation Level: Active and Engaged   Participation Quality: Independent   Behavior: Alert, Appropriate, Calm, and Cooperative   Speech/Thought Process: Coherent   Insight: Fair   Judgement: Good   Modes of Intervention: Activity and Guided Discussion   Patient Response to Interventions:  Engaged and Receptive   Education Outcome:  Acknowledges education   Clinical Observations/Individualized Feedback: Robert Chan was active in their participation of session activities and group discussion. Pt identified that he has some meditation before and found this session to be "calming". Pt required much encouragement from LRT to come outside and join group. Pt was pleasant and interacted well with LRT and peers duration of group.    Plan: Continue to engage patient in RT group sessions 2-3x/week.   Vilma Prader, LRT, CTRS 01/13/2023 2:59 PM

## 2023-01-13 NOTE — Group Note (Signed)
[facility] LCSW Group Therapy Note   Group Date: 01/13/2023 Start Time: [id number] End Time: 1405   Type of Therapy/Topic:  Group Therapy:  Balance in Life  Participation Level:  Did Not Attend   Description of Group:    This group will address the concept of balance and how it feels and looks when one is unbalanced. Patients will be encouraged to process areas in their lives that are out of balance, and identify reasons for remaining unbalanced. Facilitators will guide patients utilizing problem- solving interventions to address and correct the stressor making their life unbalanced. Understanding and applying boundaries will be explored and addressed for obtaining  and maintaining a balanced life. Patients will be encouraged to explore ways to assertively make their unbalanced needs known to significant others in their lives, using other group members and facilitator for support and feedback.  Therapeutic Goals: Patient will identify two or more emotions or situations they have that consume much of in their lives. Patient will identify signs/triggers that life has become out of balance:  Patient will identify two ways to set boundaries in order to achieve balance in their lives:  Patient will demonstrate ability to communicate their needs through discussion and/or role plays  Summary of Patient Progress:    X    Therapeutic Modalities:   Cognitive Behavioral Therapy Solution-Focused Therapy Assertiveness Training   [place], LCSWA

## 2023-01-14 DIAGNOSIS — F332 Major depressive disorder, recurrent severe without psychotic features: Secondary | ICD-10-CM | POA: Diagnosis not present

## 2023-01-14 LAB — HEMOGLOBIN A1C
Hgb A1c MFr Bld: 5.2 % (ref 4.8–5.6)
Mean Plasma Glucose: 103 mg/dL

## 2023-01-14 MED ORDER — DILTIAZEM HCL ER COATED BEADS 300 MG PO CP24
300.0000 mg | ORAL_CAPSULE | Freq: Every day | ORAL | Status: DC
  Administered 2023-01-15 – 2023-01-21 (×7): 300 mg via ORAL
  Filled 2023-01-14 (×8): qty 1

## 2023-01-14 NOTE — Group Note (Signed)
Recreation Therapy Group Note   Group Topic:Health and Wellness  Group Date: 01/14/2023 Start Time: 1400 End Time: 1445 Facilitators: Vilma Prader, LRT, CTRS Location:  Dayroom  Group Description: Seated Exercise. LRT discussed the mental and physical benefits of exercise. LRT and group discussed how physical activity can be used as a coping skill. Pt's and LRT followed along to an exercise video on the TV screen that provided a visual representation and audio description of every exercise performed. Pt's encouraged to listen to their bodies and stop at any time if they experience feelings of discomfort or pain. LRT passed out water after session was over and encouraged pts do drink and stay hydrated.  Affect/Mood: Calm, Appropriate    Participation Level: Active and Engaged   Participation Quality: Independent   Behavior: Alert, Appropriate, Calm, and Cooperative   Speech/Thought Process: [place]   Insight: Good   Judgement: Good   Modes of Intervention: Activity and Education   Patient Response to Interventions:  Attentive, Engaged, Interested , and Receptive   Education Outcome:  Acknowledges education   Clinical Observations/Individualized Feedback: Nephtali was active in their participation of session activities and group discussion. Pt identified "exercise helps to get your blood flowing and everything moving in your body." Pt interacted appropriately with peers and LRT. Pt completed all prompted exercises and received a cup of water after session.   Plan: Continue to engage patient in RT group sessions 2-3x/week.   Vilma Prader, LRT, CTRS 01/14/2023 2:53 PM

## 2023-01-14 NOTE — Progress Notes (Signed)
D- Patient alert and oriented x 4. Patient presents sad and tearful during morning assessment. Patient pleasant. Patient denies SI, HI, AVH, and pain. Patient endorses depression but is unable to rate his anxiety level to Probation officer. Encouragement provided by Probation officer and patient informed to notify writer if depression worsens,  he would like to talk further, or just wants someone to listen.   A- Scheduled medications administered to patient, per MD orders. Support and encouragement provided.  Routine safety checks conducted every 15 minutes.  Patient informed to notify staff with problems or concerns.  R- No adverse drug reactions noted. Patient contracts for safety at this time. Patient compliant with medications and treatment plan. Patient receptive, calm, and cooperative. Patient interacts well with others on the unit.  Patient remains safe at this time.   01/14/23 0952  Charting Type  Charting Type Shift assessment  Safety Check Verification  Has the RN verified the 15 minute safety check completion? Yes  Neurological  Neuro (WDL) WDL  Orientation Level Oriented X4  Cognition Appropriate at baseline  Speech Clear  Neuro Symptoms Depression  Neuro symptoms relieved by Other (Comment) Regulatory affairs officer provided supportive listening)  HEENT  HEENT (WDL) WDL  Respiratory  Respiratory (WDL) WDL  Cough None  Respiratory Pattern Regular;Unlabored  Chest Assessment Chest expansion symmetrical  Cardiac  Cardiac (WDL) WDL  Vascular  Vascular (WDL) WDL  Integumentary  Integumentary (WDL) WDL  Braden Scale (Ages 8 and up)  Sensory Perceptions 4  Moisture 4  Activity 3  Mobility 4  Nutrition 4  Friction and Shear 3  Braden Scale Score 22  Musculoskeletal  Musculoskeletal (WDL) WDL  Assistive Device None  Gastrointestinal  Gastrointestinal (WDL) WDL  GU Assessment  Genitourinary (WDL) WDL  Neurological  Level of Consciousness Alert

## 2023-01-14 NOTE — Plan of Care (Signed)
  Problem: Education: Goal: Knowledge of General Education information will improve Description: Including pain rating scale, medication(s)/side effects and non-pharmacologic comfort measures Outcome: Progressing   Problem: Health Behavior/Discharge Planning: Goal: Ability to manage health-related needs will improve Outcome: Progressing   Problem: Clinical Measurements: Goal: Ability to maintain clinical measurements within normal limits will improve Outcome: Progressing Goal: Will remain free from infection Outcome: Progressing Goal: Diagnostic test results will improve Outcome: Progressing Goal: Respiratory complications will improve Outcome: Progressing Goal: Cardiovascular complication will be avoided Outcome: Progressing   Problem: Activity: Goal: Risk for activity intolerance will decrease Outcome: Progressing   Problem: Nutrition: Goal: Adequate nutrition will be maintained Outcome: Progressing   Problem: Pain Managment: Goal: General experience of comfort will improve Outcome: Progressing   Problem: Safety: Goal: Ability to remain free from injury will improve Outcome: Progressing   Problem: Education: Goal: Emotional status will improve Outcome: Progressing Goal: Mental status will improve Outcome: Progressing Goal: Verbalization of understanding the information provided will improve Outcome: Progressing

## 2023-01-14 NOTE — Progress Notes (Signed)
[facility] MD Progress Note  01/14/2023 2:14 PM Robert Chan  MRN:  [name] Subjective: Robert Chan is seen on rounds.  His blood pressure continues to be a problem somewhat to go up on his Cardizem.  Yesterday increased his Abilify to 10 mg/day.  He has been compliant with medications and denies any side effects.  Nurses report no issues.  He has not required as needed sleep medication.  His mood is slightly improved.  Principal Problem: MDD (major depressive disorder), recurrent severe, without psychosis ([place]) Diagnosis: Principal Problem:   MDD (major depressive disorder), recurrent severe, without psychosis ([place])  Total Time spent with patient: 15 minutes  Past Psychiatric History: History of depression and polysubstance abuse.  Past Medical History:  Past Medical History:  Diagnosis Date   Acute bilateral low back pain without sciatica 02/27/2021   Asthma    CAD (coronary artery disease)    Cataract    CKD (chronic kidney disease)    Cocaine use disorder, mild, abuse (HCC)    COPD (chronic obstructive pulmonary disease) (HCC)    Coronary vasospasm ([place]) 10/07/2020   Depression    Elevated serum creatinine 08/28/2019   Homelessness 06/19/2020   Hypertension    NSTEMI (non-ST elevated myocardial infarction) [facility])    Status post incision and drainage 04/22/2021   Suicidal ideation 06/05/2020    Past Surgical History:  Procedure Laterality Date   APPENDECTOMY     EYE SURGERY     LEFT HEART CATH AND CORONARY ANGIOGRAPHY N/A 06/05/2020   Procedure: LEFT HEART CATH AND CORONARY ANGIOGRAPHY;  Surgeon: Nigel Mormon, MD;  Location: [facility];  Service: Cardiovascular;  Laterality: N/A;   PROSTATE SURGERY     Family History:  Family History  Problem Relation Age of Onset   Hypertension Mother    Diabetes Mother    Hypertension Father    Hypertension Sister    Family Psychiatric  History: Unremarkable Social History:  Social History   Substance and Sexual Activity   Alcohol Use Yes   Comment: drinks 2x/wk, 2-3 40 oz drinks, last drink 2 weeks ago     Social History   Substance and Sexual Activity  Drug Use Not Currently   Types: Cocaine   Comment: cocaine use once a month, last use 6 mos ago    Social History   Socioeconomic History   Marital status: Single    Spouse name: Not on file   Number of children: Not on file   Years of education: Not on file   Highest education level: Not on file  Occupational History   Not on file  Tobacco Use   Smoking status: Some Days    Types: Cigarettes    Last attempt to quit: 12/25/2020    Years since quitting: 2.0   Smokeless tobacco: Never   Tobacco comments:    Smokes a couple cigarettes every now and then  Vaping Use   Vaping Use: Never used  Substance and Sexual Activity   Alcohol use: Yes    Comment: drinks 2x/wk, 2-3 40 oz drinks, last drink 2 weeks ago   Drug use: Not Currently    Types: Cocaine    Comment: cocaine use once a month, last use 6 mos ago   Sexual activity: Not Currently  Other Topics Concern   Not on file  Social History Narrative   His mother passed at age 63 (when he was 82 years old) and his father raised him and his siblings  His parents were pastors as a lot of his sisters and other family members are today   There was a total of 13 children in his family Had 72 sisters, one sister deceased , Had 3 brothers, all brothers deceased   He is the youngest of the 3 and was "always in trouble in my younger years"   Family still live in [place], [place], some in [place], [place]   Has a Daughter and son with a total of 10 grandchildren (88 grand daughters local and 8 grandsons)    Values family   Married twice, present wife incarcerated   Social Determinants of Health   Financial Resource Strain: Low Risk  (10/19/2022)   Overall Financial Resource Strain (CARDIA)    Difficulty of Paying Living Expenses: Not hard at all  Food Insecurity: No Food Insecurity (01/11/2023)    Hunger Vital Sign    Worried About Running Out of Food in the Last Year: Never true    [place] in the Last Year: Never true  Transportation Needs: No Transportation Needs (01/11/2023)   PRAPARE - Hydrologist (Medical): No    Lack of Transportation (Non-Medical): No  Physical Activity: Inactive (10/19/2022)   Exercise Vital Sign    Days of Exercise per Week: 0 days    Minutes of Exercise per Session: 0 min  Stress: No Stress Concern Present (10/19/2022)   [place]    Feeling of Stress : Not at all  Social Connections: Moderately Integrated (03/31/2022)   Social Connection and Isolation Panel [NHANES]    Frequency of Communication with Friends and Family: Three times a week    Frequency of Social Gatherings with Friends and Family: Three times a week    Attends Religious Services: 1 to 4 times per year    Active Member of Clubs or Organizations: Yes    Attends Archivist Meetings: 1 to 4 times per year    Marital Status: Never married   Additional Social History:                         Sleep: Good  Appetite:  Good  Current Medications: Current Facility-Administered Medications  Medication Dose Route Frequency Provider Last Rate Last Admin   acetaminophen (TYLENOL) tablet 650 mg  650 mg Oral Q6H PRN Parks Ranger, DO       albuterol (PROVENTIL) (2.5 MG/3ML) 0.083% nebulizer solution 2.5 mg  2.5 mg Inhalation Q6H PRN Parks Ranger, DO       alum & mag hydroxide-simeth (MAALOX/MYLANTA) 200-200-20 MG/5ML suspension 30 mL  30 mL Oral Q4H PRN Parks Ranger, DO       ARIPiprazole (ABILIFY) tablet 10 mg  10 mg Oral Daily Parks Ranger, DO   10 mg at 01/14/23 0900   atorvastatin (LIPITOR) tablet 80 mg  80 mg Oral Daily Parks Ranger, DO   80 mg at 01/14/23 0900   [START ON 01/15/2023] diltiazem (CARDIZEM CD) 24 hr  capsule 300 mg  300 mg Oral Daily Parks Ranger, DO       doxepin (SINEQUAN) capsule 50 mg  50 mg Oral QHS Parks Ranger, DO   50 mg at 01/13/23 2112   latanoprost (XALATAN) 0.005 % ophthalmic solution 1 drop  1 drop Both Eyes QHS Parks Ranger, DO   1 drop at 01/13/23 2113  LORazepam (ATIVAN) tablet 1 mg  1 mg Oral Q4H PRN Parks Ranger, DO       magnesium hydroxide (MILK OF MAGNESIA) suspension 30 mL  30 mL Oral Daily PRN Parks Ranger, DO   30 mL at 01/11/23 1749   mirtazapine (REMERON) tablet 15 mg  15 mg Oral QHS Parks Ranger, DO   15 mg at 01/13/23 2112   mometasone-formoterol (DULERA) 200-5 MCG/ACT inhaler 2 puff  2 puff Inhalation BID Parks Ranger, DO   2 puff at 01/14/23 0857   OLANZapine (ZYPREXA) tablet 10 mg  10 mg Oral Q6H PRN Parks Ranger, DO       QUEtiapine (SEROQUEL) tablet 100 mg  100 mg Oral QHS PRN Parks Ranger, DO   100 mg at 01/11/23 2106   senna-docusate (Senokot-S) tablet 2 tablet  2 tablet Oral Daily Parks Ranger, DO   2 tablet at 01/14/23 0900    Lab Results:  Results for orders placed or performed during the hospital encounter of 01/10/23 (from the past 48 hour(s))  Hemoglobin A1c     Status: None   Collection Time: 01/13/23  8:21 AM  Result Value Ref Range   Hgb A1c MFr Bld 5.2 4.8 - 5.6 %    Comment: (NOTE)         Prediabetes: 5.7 - 6.4         Diabetes: >6.4         Glycemic control for adults with diabetes: <7.0    Mean Plasma Glucose 103 mg/dL    Comment: (NOTE) Performed At: [facility] [place], [place] [id number] Rush Farmer MD [id number]   Lipid panel     Status: None   Collection Time: 01/13/23  8:21 AM  Result Value Ref Range   Cholesterol 168 0 - 200 mg/dL   Triglycerides 62 <150 mg/dL   HDL 57 >40 mg/dL   Total CHOL/HDL Ratio 2.9 RATIO   VLDL 12 0 - 40 mg/dL   LDL Cholesterol 99 0 - 99 mg/dL    Comment:         Total Cholesterol/HDL:CHD Risk Coronary Heart Disease Risk Table                     Men   Women  1/2 Average Risk   3.4   3.3  Average Risk       5.0   4.4  2 X Average Risk   9.6   7.1  3 X Average Risk  23.4   11.0        Use the calculated Patient Ratio above and the CHD Risk Table to determine the patient's CHD Risk.        ATP III CLASSIFICATION (LDL):  <100     mg/dL   Optimal  100-129  mg/dL   Near or Above                    Optimal  130-159  mg/dL   Borderline  160-189  mg/dL   High  >190     mg/dL   Very High Performed at [facility], [address], [place], [place] [address]     Blood Alcohol level:  Lab Results  Component Value Date   [facility] <10 01/10/2023   ETH <10 [id number]    Metabolic Disorder Labs: Lab Results  Component Value Date   HGBA1C 5.2 01/13/2023   MPG 103 01/13/2023  MPG 91.06 09/07/2020   No results found for: "PROLACTIN" Lab Results  Component Value Date   CHOL 168 01/13/2023   TRIG 62 01/13/2023   HDL 57 01/13/2023   CHOLHDL 2.9 01/13/2023   VLDL 12 01/13/2023   LDLCALC 99 01/13/2023   LDLCALC 104 (H) 05/17/2022    Physical Findings: AIMS:  , ,  ,  ,    CIWA:    COWS:     Musculoskeletal: Strength & Muscle Tone: within normal limits Gait & Station: normal Patient leans: N/A  Psychiatric Specialty Exam:  Presentation  General Appearance:  Appropriate for Environment  Eye Contact: Good  Speech: Clear and Coherent  Speech Volume: Normal  Handedness: Right   Mood and Affect  Mood: Depressed  Affect: Congruent; Flat   Thought Process  Thought Processes: Coherent  Descriptions of Associations:Intact  Orientation:Full (Time, Place and Person)  Thought Content:Logical  History of Schizophrenia/Schizoaffective disorder:No  Duration of Psychotic Symptoms:N/A  Hallucinations:No data recorded Ideas of Reference:None  Suicidal Thoughts:No data recorded Homicidal Thoughts:No data  recorded  Sensorium  Memory: Immediate Fair; Recent Fair  Judgment: Fair  Insight: Fair   Community education officer  Concentration: Fair  Attention Span: Fair  Recall: [employer] of Knowledge: Fair  Language: Fair   Psychomotor Activity  Psychomotor Activity:No data recorded  Assets  Assets: Desire for Improvement; Leisure Time; Physical Health; Resilience   Sleep  Sleep:No data recorded   Physical Exam: Physical Exam Vitals and nursing note reviewed.  Constitutional:      Appearance: Normal appearance. He is normal weight.  Neurological:     General: No focal deficit present.     Mental Status: He is alert and oriented to person, place, and time.  Psychiatric:        Attention and Perception: Attention and perception normal.        Mood and Affect: Mood is depressed. Affect is flat.        Speech: Speech normal.        Behavior: Behavior normal. Behavior is cooperative.        Thought Content: Thought content normal.        Cognition and Memory: Cognition and memory normal.        Judgment: Judgment normal.    Review of Systems  Constitutional: Negative.   HENT: Negative.    Eyes: Negative.   Respiratory: Negative.    Cardiovascular: Negative.   Gastrointestinal: Negative.   Genitourinary: Negative.   Musculoskeletal: Negative.   Skin: Negative.   Neurological: Negative.   Endo/Heme/Allergies: Negative.   Psychiatric/Behavioral:  Positive for depression.    Blood pressure (!) 141/91, pulse 77, temperature 97.9 F (36.6 C), temperature source Oral, resp. rate 18, height 6\' 4"  (1.93 m), weight 116.6 kg, SpO2 97 %. Body mass index is 31.29 kg/m.   Treatment Plan Summary: Daily contact with patient to assess and evaluate symptoms and progress in treatment, Medication management, and Plan increase Cardizem CD to 300 mg/day.  Parks Ranger, DO 01/14/2023, 2:14 PM

## 2023-01-14 NOTE — Group Note (Unsigned)
Date:  01/14/2023 Time:  4:06 PM  Group Topic/Focus:  Building Self Esteem:   The Focus of this group is helping patients become aware of the effects of self-esteem on their lives, the things they and others do that enhance or undermine their self-esteem, seeing the relationship between their level of self-esteem and the choices they make and learning ways to enhance self-esteem.     Participation Level:  {BHH PARTICIPATION WO:[id number]  Participation Quality:  {BHH PARTICIPATION QUALITY:22265}  Affect:  {BHH AFFECT:22266}  Cognitive:  {BHH COGNITIVE:22267}  Insight: {BHH Insight2:20797}  Engagement in Group:  {BHH ENGAGEMENT IN BP:[id number]  Modes of Intervention:  {BHH MODES OF INTERVENTION:22269}  Additional Comments:  ***  Ladona Mow 01/14/2023, 4:06 PM

## 2023-01-14 NOTE — BH Assessment (Signed)
1910 Received patient sitting in the day room watching TV, he is alert and oriented x4. Patient is interacting with others. He is currently pleasant and cooperative. Will continue to monitor patient for safety.  2250 Patient is currently denying SI/HI, A/V hallucinations, depression and anxiety. His mood is good, affect is bright.  Will continue to monitor patient for safety.  0210 Patient continues to rest quietly in bed and has not verbalized any concerns. Will continue to monitor patient for safety.   0530 Continue to rest some coughing noted, but no acute respiratory. Willcontinue to monitor patient for safety.

## 2023-01-15 DIAGNOSIS — F332 Major depressive disorder, recurrent severe without psychotic features: Secondary | ICD-10-CM | POA: Diagnosis not present

## 2023-01-15 MED ORDER — MIRTAZAPINE 15 MG PO TABS
30.0000 mg | ORAL_TABLET | Freq: Every day | ORAL | Status: DC
  Administered 2023-01-15 – 2023-01-20 (×6): 30 mg via ORAL
  Filled 2023-01-15 (×6): qty 2

## 2023-01-15 NOTE — Plan of Care (Signed)
Pt denies anxiety/depression at this time. Pt denies SI/HI/AVH or pain at this time. Pt is calm and cooperative. Pt is medication compliant. Pt provided with support and encouragement. Pt monitored q15 minutes for safety per unit policy. Plan of care ongoing.   Pt observed visible and active in the milieu. Pt also went outside to MHT group.    Problem: Education: Goal: Knowledge of General Education information will improve Description: Including pain rating scale, medication(s)/side effects and non-pharmacologic comfort measures Outcome: Progressing   Problem: Nutrition: Goal: Adequate nutrition will be maintained Outcome: Progressing

## 2023-01-15 NOTE — Progress Notes (Signed)
[facility] MD Progress Note  01/15/2023 2:31 PM Robert Chan  MRN:  [name] Subjective: Follow-up for patient with depression.  Patient tells me he came to the hospital because his mood had been bouncing up and down causing him to have thoughts about being violent to his daughter's boyfriends.  He says he is still feeling anxious and down although he has no suicidal or homicidal ideation currently.  Denies psychotic symptoms.  Shows good insight but says he is still not feeling right.  Physically stable although he feels a bit tired. Principal Problem: MDD (major depressive disorder), recurrent severe, without psychosis ([place]) Diagnosis: Principal Problem:   MDD (major depressive disorder), recurrent severe, without psychosis ([place])  Total Time spent with patient: 30 minutes  Past Psychiatric History: Past history of depression  Past Medical History:  Past Medical History:  Diagnosis Date   Acute bilateral low back pain without sciatica 02/27/2021   Asthma    CAD (coronary artery disease)    Cataract    CKD (chronic kidney disease)    Cocaine use disorder, mild, abuse (HCC)    COPD (chronic obstructive pulmonary disease) (HCC)    Coronary vasospasm ([place]) 10/07/2020   Depression    Elevated serum creatinine 08/28/2019   Homelessness 06/19/2020   Hypertension    NSTEMI (non-ST elevated myocardial infarction) [facility])    Status post incision and drainage 04/22/2021   Suicidal ideation 06/05/2020    Past Surgical History:  Procedure Laterality Date   APPENDECTOMY     EYE SURGERY     LEFT HEART CATH AND CORONARY ANGIOGRAPHY N/A 06/05/2020   Procedure: LEFT HEART CATH AND CORONARY ANGIOGRAPHY;  Surgeon: Nigel Mormon, MD;  Location: [facility];  Service: Cardiovascular;  Laterality: N/A;   PROSTATE SURGERY     Family History:  Family History  Problem Relation Age of Onset   Hypertension Mother    Diabetes Mother    Hypertension Father    Hypertension Sister    Family  Psychiatric  History: See previous Social History:  Social History   Substance and Sexual Activity  Alcohol Use Yes   Comment: drinks 2x/wk, 2-3 40 oz drinks, last drink 2 weeks ago     Social History   Substance and Sexual Activity  Drug Use Not Currently   Types: Cocaine   Comment: cocaine use once a month, last use 6 mos ago    Social History   Socioeconomic History   Marital status: Single    Spouse name: Not on file   Number of children: Not on file   Years of education: Not on file   Highest education level: Not on file  Occupational History   Not on file  Tobacco Use   Smoking status: Some Days    Types: Cigarettes    Last attempt to quit: 12/25/2020    Years since quitting: 2.0   Smokeless tobacco: Never   Tobacco comments:    Smokes a couple cigarettes every now and then  Vaping Use   Vaping Use: Never used  Substance and Sexual Activity   Alcohol use: Yes    Comment: drinks 2x/wk, 2-3 40 oz drinks, last drink 2 weeks ago   Drug use: Not Currently    Types: Cocaine    Comment: cocaine use once a month, last use 6 mos ago   Sexual activity: Not Currently  Other Topics Concern   Not on file  Social History Narrative   His mother passed at age 24 (  when he was 82 years old) and his father raised him and his siblings   His parents were pastors as a lot of his sisters and other family members are today   There was a total of 13 children in his family Had 74 sisters, one sister deceased , Had 3 brothers, all brothers deceased   He is the youngest of the 57 and was "always in trouble in my younger years"   Family still live in [place], [place], some in [place], [place]   Has a Daughter and son with a total of 10 grandchildren (40 grand daughters local and 19 grandsons)    Values family   Married twice, present wife incarcerated   Social Determinants of Health   Financial Resource Strain: [place]  (10/19/2022)   Overall Financial Resource Strain (CARDIA)     Difficulty of Paying Living Expenses: Not hard at all  Food Insecurity: No Food Insecurity (01/11/2023)   Hunger Vital Sign    Worried About Running Out of Food in the Last Year: Never true    [place] in the Last Year: Never true  Transportation Needs: No Transportation Needs (01/11/2023)   PRAPARE - Hydrologist (Medical): No    Lack of Transportation (Non-Medical): No  Physical Activity: Inactive (10/19/2022)   Exercise Vital Sign    Days of Exercise per Week: 0 days    Minutes of Exercise per Session: 0 min  Stress: No Stress Concern Present (10/19/2022)   [place]    Feeling of Stress : Not at all  Social Connections: Moderately Integrated (03/31/2022)   Social Connection and Isolation Panel [NHANES]    Frequency of Communication with Friends and Family: Three times a week    Frequency of Social Gatherings with Friends and Family: Three times a week    Attends Religious Services: 1 to 4 times per year    Active Member of Clubs or Organizations: Yes    Attends Archivist Meetings: 1 to 4 times per year    Marital Status: Never married   Additional Social History:                         Sleep: Fair  Appetite:  Fair  Current Medications: Current Facility-Administered Medications  Medication Dose Route Frequency Provider Last Rate Last Admin   acetaminophen (TYLENOL) tablet 650 mg  650 mg Oral Q6H PRN Parks Ranger, DO       albuterol (PROVENTIL) (2.5 MG/3ML) 0.083% nebulizer solution 2.5 mg  2.5 mg Inhalation Q6H PRN Parks Ranger, DO       alum & mag hydroxide-simeth (MAALOX/MYLANTA) 200-200-20 MG/5ML suspension 30 mL  30 mL Oral Q4H PRN Parks Ranger, DO       ARIPiprazole (ABILIFY) tablet 10 mg  10 mg Oral Daily Parks Ranger, DO   10 mg at 01/15/23 0909   atorvastatin (LIPITOR) tablet 80 mg  80 mg Oral Daily  Parks Ranger, DO   80 mg at 01/15/23 [id number]   diltiazem (CARDIZEM CD) 24 hr capsule 300 mg  300 mg Oral Daily Parks Ranger, DO   300 mg at 01/15/23 0834   doxepin (SINEQUAN) capsule 50 mg  50 mg Oral QHS Parks Ranger, DO   50 mg at 01/14/23 2050   latanoprost (XALATAN) 0.005 % ophthalmic solution 1 drop  1 drop  Both Eyes QHS Parks Ranger, DO   1 drop at 01/14/23 2111   LORazepam (ATIVAN) tablet 1 mg  1 mg Oral Q4H PRN Parks Ranger, DO       magnesium hydroxide (MILK OF MAGNESIA) suspension 30 mL  30 mL Oral Daily PRN Parks Ranger, DO   30 mL at 01/11/23 1749   mirtazapine (REMERON) tablet 30 mg  30 mg Oral QHS Coltin Casher, Madie Reno, MD       mometasone-formoterol [facility]) 200-5 MCG/ACT inhaler 2 puff  2 puff Inhalation BID Parks Ranger, DO   2 puff at 01/15/23 0834   OLANZapine (ZYPREXA) tablet 10 mg  10 mg Oral Q6H PRN Parks Ranger, DO       QUEtiapine (SEROQUEL) tablet 100 mg  100 mg Oral QHS PRN Parks Ranger, DO   100 mg at 01/11/23 2106   senna-docusate (Senokot-S) tablet 2 tablet  2 tablet Oral Daily Parks Ranger, DO   2 tablet at 01/15/23 [id number]    Lab Results: No results found for this or any previous visit (from the past 48 hour(s)).  Blood Alcohol level:  Lab Results  Component Value Date   ETH <10 01/10/2023   ETH <10 [id number]    Metabolic Disorder Labs: Lab Results  Component Value Date   HGBA1C 5.2 01/13/2023   MPG 103 01/13/2023   MPG 91.06 09/07/2020   No results found for: "PROLACTIN" Lab Results  Component Value Date   CHOL 168 01/13/2023   TRIG 62 01/13/2023   HDL 57 01/13/2023   CHOLHDL 2.9 01/13/2023   VLDL 12 01/13/2023   LDLCALC 99 01/13/2023   LDLCALC 104 (H) 05/17/2022    Physical Findings: AIMS:  , ,  ,  ,    CIWA:    COWS:     Musculoskeletal: Strength & Muscle Tone: within normal limits Gait & Station: normal Patient leans: N/A  Psychiatric  Specialty Exam:  Presentation  General Appearance:  Appropriate for Environment  Eye Contact: Good  Speech: Clear and Coherent  Speech Volume: Normal  Handedness: Right   Mood and Affect  Mood: Depressed  Affect: Congruent; Flat   Thought Process  Thought Processes: Coherent  Descriptions of Associations:Intact  Orientation:Full (Time, Place and Person)  Thought Content:Logical  History of Schizophrenia/Schizoaffective disorder:No  Duration of Psychotic Symptoms:N/A  Hallucinations:No data recorded Ideas of Reference:None  Suicidal Thoughts:No data recorded Homicidal Thoughts:No data recorded  Sensorium  Memory: Immediate Fair; Recent Fair  Judgment: Fair  Insight: Fair   Community education officer  Concentration: Fair  Attention Span: Fair  Recall: [employer] of Knowledge: Fair  Language: Fair   Psychomotor Activity  Psychomotor Activity:No data recorded  Assets  Assets: Desire for Improvement; Leisure Time; Physical Health; Resilience   Sleep  Sleep:No data recorded   Physical Exam: Physical Exam Vitals and nursing note reviewed.  Constitutional:      Appearance: Normal appearance.  HENT:     Head: Normocephalic and atraumatic.     Mouth/Throat:     Pharynx: Oropharynx is clear.  Eyes:     Pupils: Pupils are equal, round, and reactive to light.  Cardiovascular:     Rate and Rhythm: Normal rate and regular rhythm.  Pulmonary:     Effort: Pulmonary effort is normal.     Breath sounds: Normal breath sounds.  Abdominal:     General: Abdomen is flat.     Palpations: Abdomen is soft.  Musculoskeletal:  General: Normal range of motion.  Skin:    General: Skin is warm and dry.  Neurological:     General: No focal deficit present.     Mental Status: He is alert. Mental status is at baseline.  Psychiatric:        Attention and Perception: Attention normal.        Mood and Affect: Mood is depressed. Affect is  blunt.        Speech: Speech is delayed.        Behavior: Behavior is slowed.        Thought Content: Thought content normal.        Cognition and Memory: Cognition normal.        Judgment: Judgment normal.    Review of Systems  Constitutional: Negative.   HENT: Negative.    Eyes: Negative.   Respiratory: Negative.    Cardiovascular: Negative.   Gastrointestinal: Negative.   Musculoskeletal: Negative.   Skin: Negative.   Neurological: Negative.   Psychiatric/Behavioral:  Positive for depression. Negative for suicidal ideas.    Blood pressure 137/73, pulse 70, temperature 98.6 F (37 C), temperature source Oral, resp. rate 18, height 6\' 4"  (1.93 m), weight 116.6 kg, SpO2 99 %. Body mass index is 31.29 kg/m.   Treatment Plan Summary: Plan continues with depressive symptoms.  Increased dose of Remeron to 30 mg at night.  Supportive counseling.  Review of labs.  No other change to treatment plan for now.  Alethia Berthold, MD 01/15/2023, 2:31 PM

## 2023-01-15 NOTE — BH Assessment (Signed)
1905 Received patient sitting the dayroom alert and oriented x 4, he is currently denying SI/HI, A/V hallucinations, depression, anxiety, and pain. He is in a good mood stating that he is cold because his room was very cold. Patient was moved to room LL027. Will continue to monitor patient for safety.  2130 Patient received his HS medications and he was able to swallow them with problem. He did go to bed after watching TV.He has made no further complaints about being cold.  2230 Patient continues to be medication compliant. He has made no reference regarding how many pills he has to take. Will continue to monitor patient for safety.  0130 Patient remain safe sleeping in his bed. Will continue to monitor for safety.  0300 Continue to rest quietly in bed with eyes closed. Will continue to monitor patient for safety.  4403 Continue to rest quietly in bed. Will continue to monitor patient for safety.

## 2023-01-15 NOTE — Plan of Care (Signed)
  Problem: Nutrition: Goal: Adequate nutrition will be maintained Outcome: Progressing   Problem: Coping: Goal: Level of anxiety will decrease Outcome: Progressing   Problem: Education: Goal: Mental status will improve Outcome: Progressing  Patient visible in Milieu this shift interacting well with Peers and Staff. Denies any SI/HI/A/VH and verbally contracted for safety. Compliant with medications. No adverse effects noted.  Q 15 minutes safety checks ongoing Patient remains safe. Support and encouragement provided.

## 2023-01-15 NOTE — Group Note (Signed)
[facility] LCSW Group Therapy Note    Group Date: 01/15/2023 Start Time: [id number] End Time: 1330  Type of Therapy and Topic:  Group Therapy:  Overcoming Obstacles  Participation Level:  [facility] PARTICIPATION LEVEL: Did Not Attend  Mood:  Description of Group:   In this group patients will be encouraged to explore what they see as obstacles to their own wellness and recovery. They will be guided to discuss their thoughts, feelings, and behaviors related to these obstacles. The group will process together ways to cope with barriers, with attention given to specific choices patients can make. Each patient will be challenged to identify changes they are motivated to make in order to overcome their obstacles. This group will be process-oriented, with patients participating in exploration of their own experiences as well as giving and receiving support and challenge from other group members.  Therapeutic Goals: 1. Patient will identify personal and current obstacles as they relate to admission. 2. Patient will identify barriers that currently interfere with their wellness or overcoming obstacles.  3. Patient will identify feelings, thought process and behaviors related to these barriers. 4. Patient will identify two changes they are willing to make to overcome these obstacles:    Summary of Patient Progress Patient declined to attend group, though encouraged to do so by this clinician.  CSW assessed for any additional support needed and patient declined.    Therapeutic Modalities:   Cognitive Behavioral Therapy Solution Focused Therapy Motivational Interviewing Relapse Prevention Therapy   Rozann Lesches, LCSW

## 2023-01-16 DIAGNOSIS — F332 Major depressive disorder, recurrent severe without psychotic features: Secondary | ICD-10-CM | POA: Diagnosis not present

## 2023-01-16 NOTE — B[facility] Group Notes (Signed)
[facility] Group Notes:  (Nursing/MHT/Case Management/Adjunct)  Date:  01/16/2023  Time:  2:50 PM  Type of Therapy:   Trivia  Participation Level:  Active  Participation Quality:  Appropriate and Attentive  Affect:  Appropriate  Cognitive:  Alert and Appropriate  Insight:  Appropriate  Engagement in Group:  Engaged  Modes of Intervention:  Activity and Discussion  Summary of Progress/Problems: We went outside and done "[employer] [place]'s Day Trivia" along with a generational trivia.  Robert Chan 01/16/2023, 2:50 PM

## 2023-01-16 NOTE — Group Note (Signed)
Date:  01/16/2023 Time:  8:48 PM  Group Topic/Focus:  Goals Group:   The focus of this group is to help patients establish daily goals to achieve during treatment and discuss how the patient can incorporate goal setting into their daily lives to aide in recovery. Healthy Communication:   The focus of this group is to discuss communication, barriers to communication, as well as healthy ways to communicate with others.    Participation Level:  Active  Participation Quality:  Supportive  Affect:  Appropriate  Cognitive:  Appropriate  Insight: Appropriate  Engagement in Group:  Engaged  Modes of Intervention:  Support  Additional Comments:    Bradd Canary 01/16/2023, 8:48 PM

## 2023-01-16 NOTE — Plan of Care (Signed)
°  Problem: Nutrition: °Goal: Adequate nutrition will be maintained °Outcome: Progressing °  °Problem: Coping: °Goal: Level of anxiety will decrease °Outcome: Progressing °  °Problem: Safety: °Goal: Ability to remain free from injury will improve °Outcome: Progressing °  °

## 2023-01-16 NOTE — Progress Notes (Signed)
[facility] MD Progress Note  01/16/2023 1:46 PM Robert Chan  MRN:  [name] Subjective: Follow-up patient with depression.  Patient seen and chart reviewed.  Vitals normal.  Patient says he is feeling better today.  Does not talk about assaulting anybody does not talk about suicidal ideation.  He says he feels in general that his nerves are getting better. Principal Problem: MDD (major depressive disorder), recurrent severe, without psychosis ([place]) Diagnosis: Principal Problem:   MDD (major depressive disorder), recurrent severe, without psychosis ([place])  Total Time spent with patient: 30 minutes  Past Psychiatric History: History of recurrent depression  Past Medical History:  Past Medical History:  Diagnosis Date   Acute bilateral low back pain without sciatica 02/27/2021   Asthma    CAD (coronary artery disease)    Cataract    CKD (chronic kidney disease)    Cocaine use disorder, mild, abuse (HCC)    COPD (chronic obstructive pulmonary disease) (HCC)    Coronary vasospasm ([place]) 10/07/2020   Depression    Elevated serum creatinine 08/28/2019   Homelessness 06/19/2020   Hypertension    NSTEMI (non-ST elevated myocardial infarction) [facility])    Status post incision and drainage 04/22/2021   Suicidal ideation 06/05/2020    Past Surgical History:  Procedure Laterality Date   APPENDECTOMY     EYE SURGERY     LEFT HEART CATH AND CORONARY ANGIOGRAPHY N/A 06/05/2020   Procedure: LEFT HEART CATH AND CORONARY ANGIOGRAPHY;  Surgeon: Nigel Mormon, MD;  Location: [facility];  Service: Cardiovascular;  Laterality: N/A;   PROSTATE SURGERY     Family History:  Family History  Problem Relation Age of Onset   Hypertension Mother    Diabetes Mother    Hypertension Father    Hypertension Sister    Family Psychiatric  History: See previous Social History:  Social History   Substance and Sexual Activity  Alcohol Use Yes   Comment: drinks 2x/wk, 2-3 40 oz drinks, last drink 2 weeks  ago     Social History   Substance and Sexual Activity  Drug Use Not Currently   Types: Cocaine   Comment: cocaine use once a month, last use 6 mos ago    Social History   Socioeconomic History   Marital status: Single    Spouse name: Not on file   Number of children: Not on file   Years of education: Not on file   Highest education level: Not on file  Occupational History   Not on file  Tobacco Use   Smoking status: Some Days    Types: Cigarettes    Last attempt to quit: 12/25/2020    Years since quitting: 2.0   Smokeless tobacco: Never   Tobacco comments:    Smokes a couple cigarettes every now and then  Vaping Use   Vaping Use: Never used  Substance and Sexual Activity   Alcohol use: Yes    Comment: drinks 2x/wk, 2-3 40 oz drinks, last drink 2 weeks ago   Drug use: Not Currently    Types: Cocaine    Comment: cocaine use once a month, last use 6 mos ago   Sexual activity: Not Currently  Other Topics Concern   Not on file  Social History Narrative   His mother passed at age 84 (when he was 57 years old) and his father raised him and his siblings   His parents were pastors as a lot of his sisters and other family members are today  There was a total of 13 children in his family Had 51 sisters, one sister deceased , Had 3 brothers, all brothers deceased   He is the youngest of the 45 and was "always in trouble in my younger years"   Family still live in [place], [place], some in [place], [place]   Has a Daughter and son with a total of 10 grandchildren (52 grand daughters local and 74 grandsons)    Values family   Married twice, present wife incarcerated   Social Determinants of Health   Financial Resource Strain: Low Risk  (10/19/2022)   Overall Financial Resource Strain (CARDIA)    Difficulty of Paying Living Expenses: Not hard at all  Food Insecurity: No Food Insecurity (01/11/2023)   Hunger Vital Sign    Worried About Running Out of Food in the Last Year: Never  true    [place] in the Last Year: Never true  Transportation Needs: No Transportation Needs (01/11/2023)   PRAPARE - Hydrologist (Medical): No    Lack of Transportation (Non-Medical): No  Physical Activity: Inactive (10/19/2022)   Exercise Vital Sign    Days of Exercise per Week: 0 days    Minutes of Exercise per Session: 0 min  Stress: No Stress Concern Present (10/19/2022)   [place]    Feeling of Stress : Not at all  Social Connections: Moderately Integrated (03/31/2022)   Social Connection and Isolation Panel [NHANES]    Frequency of Communication with Friends and Family: Three times a week    Frequency of Social Gatherings with Friends and Family: Three times a week    Attends Religious Services: 1 to 4 times per year    Active Member of Clubs or Organizations: Yes    Attends Archivist Meetings: 1 to 4 times per year    Marital Status: Never married   Additional Social History:                         Sleep: Fair  Appetite:  Fair  Current Medications: Current Facility-Administered Medications  Medication Dose Route Frequency Provider Last Rate Last Admin   acetaminophen (TYLENOL) tablet 650 mg  650 mg Oral Q6H PRN Parks Ranger, DO       albuterol (PROVENTIL) (2.5 MG/3ML) 0.083% nebulizer solution 2.5 mg  2.5 mg Inhalation Q6H PRN Parks Ranger, DO       alum & mag hydroxide-simeth (MAALOX/MYLANTA) 200-200-20 MG/5ML suspension 30 mL  30 mL Oral Q4H PRN Parks Ranger, DO       ARIPiprazole (ABILIFY) tablet 10 mg  10 mg Oral Daily Parks Ranger, DO   10 mg at 01/16/23 0909   atorvastatin (LIPITOR) tablet 80 mg  80 mg Oral Daily Parks Ranger, DO   80 mg at 01/16/23 0910   diltiazem (CARDIZEM CD) 24 hr capsule 300 mg  300 mg Oral Daily Parks Ranger, DO   300 mg at 01/16/23 0910   doxepin  (SINEQUAN) capsule 50 mg  50 mg Oral QHS Parks Ranger, DO   50 mg at 01/15/23 2109   latanoprost (XALATAN) 0.005 % ophthalmic solution 1 drop  1 drop Both Eyes QHS Parks Ranger, DO   1 drop at 01/15/23 2224   LORazepam (ATIVAN) tablet 1 mg  1 mg Oral Q4H PRN Parks Ranger, DO  magnesium hydroxide (MILK OF MAGNESIA) suspension 30 mL  30 mL Oral Daily PRN Parks Ranger, DO   30 mL at 01/11/23 1749   mirtazapine (REMERON) tablet 30 mg  30 mg Oral QHS Mckaylin Bastien T, MD   30 mg at 01/15/23 2108   mometasone-formoterol (DULERA) 200-5 MCG/ACT inhaler 2 puff  2 puff Inhalation BID Parks Ranger, DO   2 puff at 01/16/23 0910   OLANZapine (ZYPREXA) tablet 10 mg  10 mg Oral Q6H PRN Parks Ranger, DO       QUEtiapine (SEROQUEL) tablet 100 mg  100 mg Oral QHS PRN Parks Ranger, DO   100 mg at 01/11/23 2106   senna-docusate (Senokot-S) tablet 2 tablet  2 tablet Oral Daily Parks Ranger, DO   2 tablet at 01/16/23 [id number]    Lab Results: No results found for this or any previous visit (from the past 48 hour(s)).  Blood Alcohol level:  Lab Results  Component Value Date   ETH <10 01/10/2023   ETH <10 [id number]    Metabolic Disorder Labs: Lab Results  Component Value Date   HGBA1C 5.2 01/13/2023   MPG 103 01/13/2023   MPG 91.06 09/07/2020   No results found for: "PROLACTIN" Lab Results  Component Value Date   CHOL 168 01/13/2023   TRIG 62 01/13/2023   HDL 57 01/13/2023   CHOLHDL 2.9 01/13/2023   VLDL 12 01/13/2023   LDLCALC 99 01/13/2023   LDLCALC 104 (H) 05/17/2022    Physical Findings: AIMS:  , ,  ,  ,    CIWA:    COWS:     Musculoskeletal: Strength & Muscle Tone: within normal limits Gait & Station: normal Patient leans: N/A  Psychiatric Specialty Exam:  Presentation  General Appearance:  Appropriate for Environment  Eye Contact: Good  Speech: Clear and Coherent  Speech  Volume: Normal  Handedness: Right   Mood and Affect  Mood: Depressed  Affect: Congruent; Flat   Thought Process  Thought Processes: Coherent  Descriptions of Associations:Intact  Orientation:Full (Time, Place and Person)  Thought Content:Logical  History of Schizophrenia/Schizoaffective disorder:No  Duration of Psychotic Symptoms:N/A  Hallucinations:No data recorded Ideas of Reference:None  Suicidal Thoughts:No data recorded Homicidal Thoughts:No data recorded  Sensorium  Memory: Immediate Fair; Recent Fair  Judgment: Fair  Insight: Fair   Community education officer  Concentration: Fair  Attention Span: Fair  Recall: [employer] of Knowledge: Fair  Language: Fair   Psychomotor Activity  Psychomotor Activity:No data recorded  Assets  Assets: Desire for Improvement; Leisure Time; Physical Health; Resilience   Sleep  Sleep:No data recorded   Physical Exam: Physical Exam Vitals and nursing note reviewed.  Constitutional:      Appearance: Normal appearance.  HENT:     Head: Normocephalic and atraumatic.     Mouth/Throat:     Pharynx: Oropharynx is clear.  Eyes:     Pupils: Pupils are equal, round, and reactive to light.  Cardiovascular:     Rate and Rhythm: Normal rate and regular rhythm.  Pulmonary:     Effort: Pulmonary effort is normal.     Breath sounds: Normal breath sounds.  Abdominal:     General: Abdomen is flat.     Palpations: Abdomen is soft.  Musculoskeletal:        General: Normal range of motion.  Skin:    General: Skin is warm and dry.  Neurological:     General: No focal deficit present.  Mental Status: He is alert. Mental status is at baseline.  Psychiatric:        Attention and Perception: Attention normal.        Mood and Affect: Mood normal.        Speech: Speech normal.        Behavior: Behavior normal.        Thought Content: Thought content normal.        Cognition and Memory: Cognition normal.     Review of Systems  Constitutional: Negative.   HENT: Negative.    Eyes: Negative.   Respiratory: Negative.    Cardiovascular: Negative.   Gastrointestinal: Negative.   Musculoskeletal: Negative.   Skin: Negative.   Neurological: Negative.   Psychiatric/Behavioral: Negative.     Blood pressure 135/68, pulse (!) 57, temperature 98 F (36.7 C), resp. rate 18, height 6\' 4"  (1.93 m), weight 116.6 kg, SpO2 98 %. Body mass index is 31.29 kg/m.   Treatment Plan Summary: Medication management and Plan no change to medication management.  Supportive therapy completed.  Alethia Berthold, MD 01/16/2023, 1:46 PM

## 2023-01-16 NOTE — Progress Notes (Signed)
Patient in dayroom upon approach.  Patient reports he slept okay. Good appetite.  Denies feelings of anxiety or depression. Denies SI/HI and AVH.  Denies pain.  15 min checks in place for patient safety.  Compliant with scheduled medications.  Patient is calm and cooperative with staff.   Patient went outside to courtyard for MHT group.

## 2023-01-17 DIAGNOSIS — F332 Major depressive disorder, recurrent severe without psychotic features: Secondary | ICD-10-CM | POA: Diagnosis not present

## 2023-01-17 MED ORDER — ARIPIPRAZOLE 5 MG PO TABS
15.0000 mg | ORAL_TABLET | Freq: Every day | ORAL | Status: DC
  Administered 2023-01-18 – 2023-01-21 (×4): 15 mg via ORAL
  Filled 2023-01-17 (×4): qty 3

## 2023-01-17 MED ORDER — ARIPIPRAZOLE 5 MG PO TABS
5.0000 mg | ORAL_TABLET | Freq: Once | ORAL | Status: AC
  Administered 2023-01-17: 5 mg via ORAL
  Filled 2023-01-17: qty 1

## 2023-01-17 NOTE — Progress Notes (Signed)
   01/17/23 2100  Psych Admission Type (Psych Patients Only)  Admission Status Voluntary  Psychosocial Assessment  Patient Complaints Other (Comment) (VH)  Eye Contact Fair  Facial Expression Flat  Affect Flat  Speech Logical/coherent  Interaction Assertive  Motor Activity Slow  Appearance/Hygiene Unremarkable  Behavior Characteristics Cooperative;Appropriate to situation  Mood Pleasant  Thought Process  Coherency WDL  Content WDL  Delusions None reported or observed  Perception WDL;Hallucinations  Hallucination Visual (seeing spots and bugs crawling on the walls)  Judgment WDL  Confusion None  Danger to Self  Current suicidal ideation? Denies  Agreement Not to Harm Self Yes  Description of Agreement verbal  Danger to Others  Danger to Others None reported or observed   Progress note   D: Pt seen at nurse's station. Pt denies SI, HI, AH. Pt was vague about his self-harm thoughts but contracts for safety at [facility]. Pt endorsed VH saying that he was seeing spots and bugs crawling on the walls. Pt rates pain  0/10. Pt rates anxiety  0/10 and depression  0/10. "I feel pretty good. I've had a pretty good day today." No other concerns noted at this time.  A: Pt provided support and encouragement. Pt given scheduled medication as prescribed. PRNs as appropriate. Q15 min checks for safety.   R: Pt safe on the unit. Will continue to monitor.

## 2023-01-17 NOTE — Group Note (Signed)
[facility] LCSW Group Therapy Note    Group Date: 01/17/2023 Start Time: 1300 End Time: 1400  Type of Therapy and Topic:  Group Therapy:  Overcoming Obstacles  Participation Level:  [facility] PARTICIPATION LEVEL: Did Not Attend  Mood:  Description of Group:   In this group patients will be encouraged to explore what they see as obstacles to their own wellness and recovery. They will be guided to discuss their thoughts, feelings, and behaviors related to these obstacles. The group will process together ways to cope with barriers, with attention given to specific choices patients can make. Each patient will be challenged to identify changes they are motivated to make in order to overcome their obstacles. This group will be process-oriented, with patients participating in exploration of their own experiences as well as giving and receiving support and challenge from other group members.  Therapeutic Goals: 1. Patient will identify personal and current obstacles as they relate to admission. 2. Patient will identify barriers that currently interfere with their wellness or overcoming obstacles.  3. Patient will identify feelings, thought process and behaviors related to these barriers. 4. Patient will identify two changes they are willing to make to overcome these obstacles:    Summary of Patient Progress   X   Therapeutic Modalities:   Cognitive Behavioral Therapy Solution Focused Therapy Motivational Interviewing Relapse Prevention Therapy   Rozann Lesches, LCSW

## 2023-01-17 NOTE — Progress Notes (Signed)
Patient denies SI, HI & AVH. He was compliant with medication on the shift. He had no new behavioral issues to report on shift at this time. He seemed to sleep well through out the night.

## 2023-01-17 NOTE — Progress Notes (Signed)
D: Patient alert and oriented times 4. Pt denies HI, AVH. He denies pain. Pt endorses anxiety and depression. Also endorses SI with no plan- contracts for safety. No other concerns noted at this time.   A: Pt provided support and encouragement throughout the day. Scheduled medications given as prescribed. Takes medications whole without issue.    R: Pt remains safe on the unit with Q15 min safety checks in place. Will continue to monitor for changes.

## 2023-01-17 NOTE — Progress Notes (Signed)
   01/17/23 0739  Psych Admission Type (Psych Patients Only)  Admission Status Voluntary  Psychosocial Assessment  Patient Complaints Depression;Anxiety  Eye Contact Fair  Facial Expression Flat  Affect Flat  Speech Logical/coherent  Interaction Assertive  Motor Activity Slow  Appearance/Hygiene Unremarkable  Behavior Characteristics Cooperative  Mood Pleasant  Thought Process  Coherency WDL  Content WDL  Delusions None reported or observed  Perception WDL  Hallucination None reported or observed  Judgment WDL  Confusion None  Danger to Self  Current suicidal ideation? Denies  Danger to Others  Danger to Others None reported or observed

## 2023-01-17 NOTE — BH IP Treatment Plan (Signed)
Interdisciplinary Treatment and Diagnostic Plan Update  01/17/2023 Time of Session: 10:00AM Dameyon Wixson MRN: [name]  Principal Diagnosis: MDD (major depressive disorder), recurrent severe, without psychosis ([place])  Secondary Diagnoses: Principal Problem:   MDD (major depressive disorder), recurrent severe, without psychosis ([place])   Current Medications:  Current Facility-Administered Medications  Medication Dose Route Frequency Provider Last Rate Last Admin   acetaminophen (TYLENOL) tablet 650 mg  650 mg Oral Q6H PRN Parks Ranger, DO       albuterol (PROVENTIL) (2.5 MG/3ML) 0.083% nebulizer solution 2.5 mg  2.5 mg Inhalation Q6H PRN Parks Ranger, DO       alum & mag hydroxide-simeth (MAALOX/MYLANTA) 200-200-20 MG/5ML suspension 30 mL  30 mL Oral Q4H PRN Parks Ranger, DO       [START ON 01/18/2023] ARIPiprazole (ABILIFY) tablet 15 mg  15 mg Oral Daily Parks Ranger, DO       atorvastatin (LIPITOR) tablet 80 mg  80 mg Oral Daily Parks Ranger, DO   80 mg at 01/17/23 0950   diltiazem (CARDIZEM CD) 24 hr capsule 300 mg  300 mg Oral Daily Parks Ranger, DO   300 mg at 01/17/23 0950   doxepin (SINEQUAN) capsule 50 mg  50 mg Oral QHS Parks Ranger, DO   50 mg at 01/16/23 2114   latanoprost (XALATAN) 0.005 % ophthalmic solution 1 drop  1 drop Both Eyes QHS Parks Ranger, DO   1 drop at 01/16/23 2115   LORazepam (ATIVAN) tablet 1 mg  1 mg Oral Q4H PRN Parks Ranger, DO       magnesium hydroxide (MILK OF MAGNESIA) suspension 30 mL  30 mL Oral Daily PRN Parks Ranger, DO   30 mL at 01/11/23 1749   mirtazapine (REMERON) tablet 30 mg  30 mg Oral QHS Clapacs, John T, MD   30 mg at 01/16/23 2114   mometasone-formoterol (DULERA) 200-5 MCG/ACT inhaler 2 puff  2 puff Inhalation BID Parks Ranger, DO   2 puff at 01/17/23 0954   OLANZapine (ZYPREXA) tablet 10 mg  10 mg Oral Q6H PRN Parks Ranger, DO       QUEtiapine (SEROQUEL) tablet 100 mg  100 mg Oral QHS PRN Parks Ranger, DO   100 mg at 01/11/23 2106   senna-docusate (Senokot-S) tablet 2 tablet  2 tablet Oral Daily Parks Ranger, DO   2 tablet at 01/17/23 [id number]   PTA Medications: Medications Prior to Admission  Medication Sig Dispense Refill Last Dose   albuterol (VENTOLIN HFA) 108 (90 Base) MCG/ACT inhaler Inhale 1-2 puffs into the lungs every 6 (six) hours as needed for wheezing or shortness of breath. 90 g 2    budesonide-formoterol (SYMBICORT) 160-4.5 MCG/ACT inhaler Inhale 2 puffs into the lungs 2 (two) times daily. 1 each 3    ARIPiprazole (ABILIFY) 10 MG tablet Take 1 tablet (10 mg total) by mouth daily. (Patient not taking: Reported on 11/08/2022) 30 tablet 1    atorvastatin (LIPITOR) 80 MG tablet Take 1 tablet (80 mg total) by mouth daily. 30 tablet 1    diltiazem (CARDIZEM CD) 240 MG 24 hr capsule Take 1 capsule (240 mg total) by mouth daily. (Patient not taking: Reported on 11/08/2022) 30 capsule 1    doxepin (SINEQUAN) 50 MG capsule Take 1 capsule (50 mg total) by mouth at bedtime. (Patient not taking: Reported on 11/08/2022) 30 capsule 1    latanoprost (XALATAN) 0.005 % ophthalmic solution Place  1 drop into both eyes at bedtime. 2.5 mL 1    orphenadrine (NORFLEX) 100 MG tablet Take 1 tablet (100 mg total) by mouth 2 (two) times daily. (Patient not taking: Reported on 10/19/2022) 30 tablet 0    senna-docusate (SENOKOT-S) 8.6-50 MG tablet Take 2 tablets by mouth daily at 8 pm. (Patient not taking: Reported on 10/19/2022) 60 tablet 1     Patient Stressors: Health problems   Loss of 3 brothers to suicide   Medication change or noncompliance    Patient Strengths: Average or above average intelligence  Capable of independent living  Motivation for treatment/growth   Treatment Modalities: Medication Management, Group therapy, Case management,  1 to 1 session with clinician, Psychoeducation,  Recreational therapy.   Physician Treatment Plan for Primary Diagnosis: MDD (major depressive disorder), recurrent severe, without psychosis ([place]) Long Term Goal(s): Improvement in symptoms so as ready for discharge   Short Term Goals: Ability to identify changes in lifestyle to reduce recurrence of condition will improve Ability to verbalize feelings will improve Ability to disclose and discuss suicidal ideas Ability to demonstrate self-control will improve Ability to identify and develop effective coping behaviors will improve Ability to maintain clinical measurements within normal limits will improve Compliance with prescribed medications will improve Ability to identify triggers associated with substance abuse/mental health issues will improve  Medication Management: Evaluate patient's response, side effects, and tolerance of medication regimen.  Therapeutic Interventions: 1 to 1 sessions, Unit Group sessions and Medication administration.  Evaluation of Outcomes: Progressing  Physician Treatment Plan for Secondary Diagnosis: Principal Problem:   MDD (major depressive disorder), recurrent severe, without psychosis ([place])  Long Term Goal(s): Improvement in symptoms so as ready for discharge   Short Term Goals: Ability to identify changes in lifestyle to reduce recurrence of condition will improve Ability to verbalize feelings will improve Ability to disclose and discuss suicidal ideas Ability to demonstrate self-control will improve Ability to identify and develop effective coping behaviors will improve Ability to maintain clinical measurements within normal limits will improve Compliance with prescribed medications will improve Ability to identify triggers associated with substance abuse/mental health issues will improve     Medication Management: Evaluate patient's response, side effects, and tolerance of medication regimen.  Therapeutic Interventions: 1 to 1 sessions, Unit  Group sessions and Medication administration.  Evaluation of Outcomes: Progressing   RN Treatment Plan for Primary Diagnosis: MDD (major depressive disorder), recurrent severe, without psychosis ([place]) Long Term Goal(s): Knowledge of disease and therapeutic regimen to maintain health will improve  Short Term Goals: Ability to demonstrate self-control, Ability to participate in decision making will improve, Ability to verbalize feelings will improve, Ability to disclose and discuss suicidal ideas, Ability to identify and develop effective coping behaviors will improve, and Compliance with prescribed medications will improve  Medication Management: RN will administer medications as ordered by provider, will assess and evaluate patient's response and provide education to patient for prescribed medication. RN will report any adverse and/or side effects to prescribing provider.  Therapeutic Interventions: 1 on 1 counseling sessions, Psychoeducation, Medication administration, Evaluate responses to treatment, Monitor vital signs and CBGs as ordered, Perform/monitor CIWA, COWS, AIMS and Fall Risk screenings as ordered, Perform wound care treatments as ordered.  Evaluation of Outcomes: Progressing   LCSW Treatment Plan for Primary Diagnosis: MDD (major depressive disorder), recurrent severe, without psychosis ([place]) Long Term Goal(s): Safe transition to appropriate next level of care at discharge, Engage patient in therapeutic group addressing interpersonal concerns.  Short Term  Goals: Engage patient in aftercare planning with referrals and resources, Increase social support, Increase ability to appropriately verbalize feelings, Increase emotional regulation, Facilitate acceptance of mental health diagnosis and concerns, and Increase skills for wellness and recovery  Therapeutic Interventions: Assess for all discharge needs, 1 to 1 time with Social worker, Explore available resources and support systems,  Assess for adequacy in community support network, Educate family and significant other(s) on suicide prevention, Complete Psychosocial Assessment, Interpersonal group therapy.  Evaluation of Outcomes: Progressing   Progress in Treatment: Attending groups: No. Participating in groups: No. Taking medication as prescribed: Yes. Toleration medication: Yes. Family/Significant other contact made: No, will contact:  once permission is given. Patient understands diagnosis: Yes. Discussing patient identified problems/goals with staff: Yes. Medical problems stabilized or resolved: Yes. Denies suicidal/homicidal ideation: Yes. Issues/concerns per patient self-inventory: No. Other: none  New problem(s) identified: No, Describe:  None  Update 01/17/2023:  No changes at this time.    New Short Term/Long Term Goal(s): Patient to work towards detox, elimination of symptoms of psychosis, medication management for mood stabilization; elimination of SI thoughts; development of comprehensive mental wellness/sobriety plan. Update 01/17/2023:  No changes at this time.    Patient Goals:  "wanna get my medications right, so my mind will be ok" Update 01/17/2023:  No changes at this time.    Discharge Plan or Barriers: CSW will assist pt with development of appropriate discharge/aftercare plan. Update 01/17/2023:  No changes at this time.    Reason for Continuation of Hospitalization: Depression Medication stabilization Suicidal ideation   Estimated Length of Stay: 1-7 days Update 01/17/2023:  TBD.   Last 3 [place] Suicide Severity Risk Score: Flowsheet Row Admission (Current) from 01/10/2023 in [place] Most recent reading at 01/10/2023 11:39 PM ED from 01/10/2023 in [facility] Emergency Department at [facility] Most recent reading at 01/10/2023  1:13 PM ED from 11/08/2022 in [facility] Department at [facility] Most recent reading at 11/08/2022  4:06 PM   C-SSRS RISK CATEGORY High Risk High Risk High Risk       Last PHQ 2/9 Scores:    10/19/2022    2:53 PM 05/11/2022    2:30 PM 03/31/2022    2:15 PM  Depression screen PHQ 2/9  Decreased Interest 0 1 0  Down, Depressed, Hopeless 0 1 0  PHQ - 2 Score 0 2 0  Altered sleeping  0   Tired, decreased energy  1   Change in appetite  1   Trouble concentrating  1   Moving slowly or fidgety/restless  0   Suicidal thoughts  0   PHQ-9 Score  5     Scribe for Treatment Team: Rozann Lesches, LCSW 01/17/2023 2:16 PM

## 2023-01-17 NOTE — [facility] Group Notes (Signed)
[place] Group Notes:  (Nursing/MHT/Case Management/Adjunct)  Date:  01/17/2023  Time:  3:45 PM  Type of Therapy:  Movement Therapy  Participation Level:  Active  Participation Quality:  Appropriate and Attentive  Affect:  Appropriate  Cognitive:  Alert and Appropriate  Insight:  Appropriate  Engagement in Group:  Engaged  Modes of Intervention:  Exploration  Summary of Progress/Problems: We went outside and done some stretching with music.  Hosie Spangle 01/17/2023, 3:45 PM

## 2023-01-17 NOTE — Group Note (Signed)
Date:  01/17/2023 Time:  8:52 AM  Group Topic/Focus:  Community Meeting    Participation Level:  Active  Participation Quality:  Appropriate  Affect:  Appropriate  Cognitive:  Alert and Appropriate  Insight: Appropriate  Engagement in Group:  Engaged  Modes of Intervention:  Clarification  Additional Comments:  Community meeting to refresh patients on the unit expectations and participation of treatment  Luis Abed 01/17/2023, 8:52 AM

## 2023-01-17 NOTE — Progress Notes (Signed)
[facility] MD Progress Note  01/17/2023 10:51 AM Robert Chan  MRN:  [id number] Subjective: Robert Chan is seen on rounds.  Dr. Weber Cooks started him on Remeron this past weekend.  He thinks been helpful but he still depressed.  He is a very large man I talked about going up on his Abilify and he thought that was a good idea.  His blood pressure has been better since going up on his Cardizem. Principal Problem: MDD (major depressive disorder), recurrent severe, without psychosis ([place]) Diagnosis: Principal Problem:   MDD (major depressive disorder), recurrent severe, without psychosis ([place])  Total Time spent with patient: 15 minutes  Past Psychiatric History: History of polysubstance abuse and depression.  Past Medical History:  Past Medical History:  Diagnosis Date   Acute bilateral low back pain without sciatica 02/27/2021   Asthma    CAD (coronary artery disease)    Cataract    CKD (chronic kidney disease)    Cocaine use disorder, mild, abuse (HCC)    COPD (chronic obstructive pulmonary disease) (HCC)    Coronary vasospasm ([place]) 10/07/2020   Depression    Elevated serum creatinine 08/28/2019   Homelessness 06/19/2020   Hypertension    NSTEMI (non-ST elevated myocardial infarction) [facility])    Status post incision and drainage 04/22/2021   Suicidal ideation 06/05/2020    Past Surgical History:  Procedure Laterality Date   APPENDECTOMY     EYE SURGERY     LEFT HEART CATH AND CORONARY ANGIOGRAPHY N/A 06/05/2020   Procedure: LEFT HEART CATH AND CORONARY ANGIOGRAPHY;  Surgeon: Nigel Mormon, MD;  Location: [place] CV LAB;  Service: Cardiovascular;  Laterality: N/A;   PROSTATE SURGERY     Family History:  Family History  Problem Relation Age of Onset   Hypertension Mother    Diabetes Mother    Hypertension Father    Hypertension Sister    Family Psychiatric  History: Unremarkable Social History:  Social History   Substance and Sexual Activity  Alcohol Use Yes   Comment: drinks  2x/wk, 2-3 40 oz drinks, last drink 2 weeks ago     Social History   Substance and Sexual Activity  Drug Use Not Currently   Types: Cocaine   Comment: cocaine use once a month, last use 6 mos ago    Social History   Socioeconomic History   Marital status: Single    Spouse name: Not on file   Number of children: Not on file   Years of education: Not on file   Highest education level: Not on file  Occupational History   Not on file  Tobacco Use   Smoking status: Some Days    Types: Cigarettes    Last attempt to quit: 12/25/2020    Years since quitting: 2.0   Smokeless tobacco: Never   Tobacco comments:    Smokes a couple cigarettes every now and then  Vaping Use   Vaping Use: Never used  Substance and Sexual Activity   Alcohol use: Yes    Comment: drinks 2x/wk, 2-3 40 oz drinks, last drink 2 weeks ago   Drug use: Not Currently    Types: Cocaine    Comment: cocaine use once a month, last use 6 mos ago   Sexual activity: Not Currently  Other Topics Concern   Not on file  Social History Narrative   His mother passed at age 30 (when he was 17 years old) and his father raised him and his siblings   His parents  were pastors as a lot of his sisters and other family members are today   There was a total of 13 children in his family Had 75 sisters, one sister deceased , Had 3 brothers, all brothers deceased   He is the youngest of the 28 and was "always in trouble in my younger years"   Family still live in [place], [place], some in [place], [place]   Has a Daughter and son with a total of 10 grandchildren (49 grand daughters local and 21 grandsons)    Values family   Married twice, present wife incarcerated   Social Determinants of Health   Financial Resource Strain: [place]  (10/19/2022)   Overall Financial Resource Strain (CARDIA)    Difficulty of Paying Living Expenses: Not hard at all  Food Insecurity: No Food Insecurity (01/11/2023)   Hunger Vital Sign    Worried About  Running Out of Food in the Last Year: Never true    [place] in the Last Year: Never true  Transportation Needs: No Transportation Needs (01/11/2023)   PRAPARE - Hydrologist (Medical): No    Lack of Transportation (Non-Medical): No  Physical Activity: Inactive (10/19/2022)   Exercise Vital Sign    Days of Exercise per Week: 0 days    Minutes of Exercise per Session: 0 min  Stress: No Stress Concern Present (10/19/2022)   [place]    Feeling of Stress : Not at all  Social Connections: Moderately Integrated (03/31/2022)   Social Connection and Isolation Panel [NHANES]    Frequency of Communication with Friends and Family: Three times a week    Frequency of Social Gatherings with Friends and Family: Three times a week    Attends Religious Services: 1 to 4 times per year    Active Member of Clubs or Organizations: Yes    Attends Archivist Meetings: 1 to 4 times per year    Marital Status: Never married   Additional Social History:                         Sleep: Fair  Appetite:  Good  Current Medications: Current Facility-Administered Medications  Medication Dose Route Frequency Provider Last Rate Last Admin   acetaminophen (TYLENOL) tablet 650 mg  650 mg Oral Q6H PRN Parks Ranger, DO       albuterol (PROVENTIL) (2.5 MG/3ML) 0.083% nebulizer solution 2.5 mg  2.5 mg Inhalation Q6H PRN Parks Ranger, DO       alum & mag hydroxide-simeth (MAALOX/MYLANTA) 200-200-20 MG/5ML suspension 30 mL  30 mL Oral Q4H PRN Parks Ranger, DO       [START ON 01/18/2023] ARIPiprazole (ABILIFY) tablet 15 mg  15 mg Oral Daily Parks Ranger, DO       ARIPiprazole (ABILIFY) tablet 5 mg  5 mg Oral Once Parks Ranger, DO       atorvastatin (LIPITOR) tablet 80 mg  80 mg Oral Daily Parks Ranger, DO   80 mg at 01/17/23 0950    diltiazem (CARDIZEM CD) 24 hr capsule 300 mg  300 mg Oral Daily Parks Ranger, DO   300 mg at 01/17/23 0950   doxepin (SINEQUAN) capsule 50 mg  50 mg Oral QHS Parks Ranger, DO   50 mg at 01/16/23 2114   latanoprost (XALATAN) 0.005 % ophthalmic solution 1 drop  1 drop Both Eyes QHS Parks Ranger, DO   1 drop at 01/16/23 2115   LORazepam (ATIVAN) tablet 1 mg  1 mg Oral Q4H PRN Parks Ranger, DO       magnesium hydroxide (MILK OF MAGNESIA) suspension 30 mL  30 mL Oral Daily PRN Parks Ranger, DO   30 mL at 01/11/23 1749   mirtazapine (REMERON) tablet 30 mg  30 mg Oral QHS Clapacs, John T, MD   30 mg at 01/16/23 2114   mometasone-formoterol (DULERA) 200-5 MCG/ACT inhaler 2 puff  2 puff Inhalation BID Parks Ranger, DO   2 puff at 01/17/23 0954   OLANZapine (ZYPREXA) tablet 10 mg  10 mg Oral Q6H PRN Parks Ranger, DO       QUEtiapine (SEROQUEL) tablet 100 mg  100 mg Oral QHS PRN Parks Ranger, DO   100 mg at 01/11/23 2106   senna-docusate (Senokot-S) tablet 2 tablet  2 tablet Oral Daily Parks Ranger, DO   2 tablet at 01/17/23 [id number]    Lab Results: No results found for this or any previous visit (from the past 48 hour(s)).  Blood Alcohol level:  Lab Results  Component Value Date   ETH <10 01/10/2023   ETH <10 [id number]    Metabolic Disorder Labs: Lab Results  Component Value Date   HGBA1C 5.2 01/13/2023   MPG 103 01/13/2023   MPG 91.06 09/07/2020   No results found for: "PROLACTIN" Lab Results  Component Value Date   CHOL 168 01/13/2023   TRIG 62 01/13/2023   HDL 57 01/13/2023   CHOLHDL 2.9 01/13/2023   VLDL 12 01/13/2023   LDLCALC 99 01/13/2023   LDLCALC 104 (H) 05/17/2022    Physical Findings: AIMS:  , ,  ,  ,    CIWA:    COWS:     Musculoskeletal: Strength & Muscle Tone: within normal limits Gait & Station: normal Patient leans: N/A  Psychiatric Specialty Exam:  Presentation   General Appearance:  Appropriate for Environment  Eye Contact: Good  Speech: Clear and Coherent  Speech Volume: Normal  Handedness: Right   Mood and Affect  Mood: Depressed  Affect: Congruent; Flat   Thought Process  Thought Processes: Coherent  Descriptions of Associations:Intact  Orientation:Full (Time, Place and Person)  Thought Content:Logical  History of Schizophrenia/Schizoaffective disorder:No  Duration of Psychotic Symptoms:N/A  Hallucinations:No data recorded Ideas of Reference:None  Suicidal Thoughts:No data recorded Homicidal Thoughts:No data recorded  Sensorium  Memory: Immediate Fair; Recent Fair  Judgment: Fair  Insight: Fair   Community education officer  Concentration: Fair  Attention Span: Fair  Recall: [employer] of Knowledge: Fair  Language: Fair   Psychomotor Activity  Psychomotor Activity:No data recorded  Assets  Assets: Desire for Improvement; Leisure Time; Physical Health; Resilience   Sleep  Sleep:No data recorded   Physical Exam: Physical Exam Vitals and nursing note reviewed.  Constitutional:      Appearance: Normal appearance. He is normal weight.  Neurological:     General: No focal deficit present.     Mental Status: He is alert and oriented to person, place, and time.  Psychiatric:        Attention and Perception: Attention and perception normal.        Mood and Affect: Mood is depressed.        Speech: Speech normal.        Behavior: Behavior normal. Behavior is cooperative.  Thought Content: Thought content is paranoid.        Cognition and Memory: Cognition and memory normal.        Judgment: Judgment normal.    Review of Systems  Constitutional: Negative.   HENT: Negative.    Eyes: Negative.   Respiratory: Negative.    Cardiovascular: Negative.   Gastrointestinal: Negative.   Genitourinary: Negative.   Musculoskeletal: Negative.   Skin: Negative.   Neurological: Negative.    Endo/Heme/Allergies: Negative.   Psychiatric/Behavioral:  Positive for depression. The patient is nervous/anxious and has insomnia.    Blood pressure (!) 143/76, pulse 68, temperature 98.2 F (36.8 C), temperature source Oral, resp. rate 18, height 6\' 4"  (1.93 m), weight 116.6 kg, SpO2 99 %. Body mass index is 31.29 kg/m.   Treatment Plan Summary: Daily contact with patient to assess and evaluate symptoms and progress in treatment, Medication management, and Plan increase Abilify to 15 mg daily.  Parks Ranger, DO 01/17/2023, 10:51 AM

## 2023-01-17 NOTE — Group Note (Signed)
Recreation Therapy Group Note   Group Topic:Self-Esteem  Group Date: 01/17/2023 Start Time: 1400 End Time: 1440 Facilitators: Vilma Prader, LRT, CTRS Location:  Dayroom  Group Description: Positive Focus. Patients are given a handout that has 9 different boxes on it. Each box has a different prompt on it that requires you to identify and add something positive about themselves. LRT encourages pts to share two of their boxes to the group. LRT and pts discuss the importance of "thinking positive", self-esteem and how they can apply it to their everyday life post-discharge.  Affect/Mood: Appropriate and Happy   Participation Level: Active and Engaged   Participation Quality: Independent   Behavior: Alert, Appropriate, and Eager   Speech/Thought Process: Coherent   Insight: Good and Improved   Judgement: Good and Improved   Modes of Intervention: Guided Discussion and Worksheet   Patient Response to Interventions:  Attentive, Engaged, Interested , and Receptive   Education Outcome:  Acknowledges education   Clinical Observations/Individualized Feedback: Robert Chan was active  in their participation of session activities and group discussion. Pt identified "One positive responsibility I have is my grandkids, one way I cope with stress is taking my medications, and a time I felt proud of myself was in the gym". Pt was noticeably more interactive with LRT than in previous sessions. Pt shared that his grandkids are wanting him to take them to [facility] and that he is looking forward to that.    Plan: Continue to engage patient in RT group sessions 2-3x/week.   Vilma Prader, LRT, CTRS 01/17/2023 2:55 PM

## 2023-01-17 NOTE — Group Note (Signed)
Date:  01/17/2023 Time:  11:24 AM  Group Topic/Focus:  Overcoming Stress:   The focus of this group is to define stress and help patients assess their triggers.    Participation Level:  Active  Participation Quality:  Appropriate and Sharing  Affect:  Appropriate  Cognitive:  Alert and Appropriate  Insight: Appropriate  Engagement in Group:  Engaged  Modes of Intervention:  Activity and Discussion  Additional Comments:  Stress management: Discussion on stress management tools and how the body responds to stress. Relaxation and breathing exercise education provided to group.   Shinika Estelle C Eliora Nienhuis 01/17/2023, 11:24 AM

## 2023-01-18 DIAGNOSIS — F332 Major depressive disorder, recurrent severe without psychotic features: Secondary | ICD-10-CM | POA: Diagnosis not present

## 2023-01-18 NOTE — Group Note (Signed)
Recreation Therapy Group Note   Group Topic:Healthy Support Systems  Group Date: 01/18/2023 Start Time: 1400 End Time: 1455 Facilitators: Vilma Prader, LRT, CTRS Location:  Dayroom  Group Description: Straw Bridge. Patients were given 10 plastic drinking straws and an equal length of masking tape. Using the materials provided, patients were instructed to build a free-standing bridge-like structure to suspend an everyday item (ex: deck of cards) off the floor or table surface. All materials were required to be used in Conservation officer, nature. LRT facilitated post-activity discussion reviewing the importance of having strong and healthy support systems in our lives. LRT discussed how the people in our lives serve as the tape and the book we placed on top of our straw structure are the stressors we face in daily life. LRT and pts discussed what happens in our life when things get too heavy for [place], and we don't have strong supports outside of the hospital. Pt shared 2 of their healthy supports aloud in the group.   Affect/Mood: Appropriate   Participation Level: Active and Engaged   Participation Quality: Independent   Behavior: Alert, Appropriate, and Calm   Speech/Thought Process: Coherent   Insight: Good and Improved   Judgement: Good   Modes of Intervention: Activity and Guided Discussion   Patient Response to Interventions:  Attentive, Engaged, Interested , and Receptive   Education Outcome:  Acknowledges education   Clinical Observations/Individualized Feedback: Savir was active in their participation of session activities and group discussion. Pt identified "a outpatient therapist" as one of his healthy support systems outside of the hospital. Pt completed activity and spontaneously contributed to group discussion. Pt interacted well with LRT and peers duration of session.    Plan: Continue to engage patient in RT group sessions 2-3x/week.   Vilma Prader, LRT, CTRS 01/18/2023  3:34 PM

## 2023-01-18 NOTE — [facility] Group Notes (Signed)
[place] Group Notes:  (Nursing/MHT/Case Management/Adjunct)  Date:  01/18/2023  Time:  4:14 PM  Type of Therapy:   community meeting  Participation Level:  Active  Participation Quality:  Appropriate  Affect:  Appropriate  Cognitive:  Appropriate  Insight:  Appropriate  Engagement in Group:  Engaged  Modes of Intervention:  Activity  Summary of Progress/Problems:  Antonieta Pert 01/18/2023, 4:14 PM

## 2023-01-18 NOTE — Progress Notes (Signed)
D: Patient alert and oriented times 4. Pt denies SI, HI, AVH. He denies pain. Pt endorses anxiety and depression. No other concerns noted at this time.   A: Pt provided support and encouragement throughout the day. Scheduled medications given as prescribed. Takes medications whole without issue.    R: Pt remains safe on the unit with Q15 min safety checks in place. Will continue to monitor for changes.

## 2023-01-18 NOTE — Progress Notes (Signed)
[facility] MD Progress Note  01/18/2023 1:48 PM Robert Chan  MRN:  [id number] Subjective: Robert Chan is seen on rounds.  He has been compliant with medications.  He denies any side effects.  Nurses report no issues.  He has been sleeping well and eating well.  No side effects from his medicine.  Apparently, he was seeing spiders crawling on the walls last night.  Today, he states he is feeling better.  He denies any suicidal ideation. Principal Problem: MDD (major depressive disorder), recurrent severe, without psychosis ([place]) Diagnosis: Principal Problem:   MDD (major depressive disorder), recurrent severe, without psychosis ([place])  Total Time spent with patient: 15 minutes  Past Psychiatric History: Extensive history of depression and polysubstance abuse.  Also, noncompliance with medications.  Past Medical History:  Past Medical History:  Diagnosis Date   Acute bilateral low back pain without sciatica 02/27/2021   Asthma    CAD (coronary artery disease)    Cataract    CKD (chronic kidney disease)    Cocaine use disorder, mild, abuse (HCC)    COPD (chronic obstructive pulmonary disease) (HCC)    Coronary vasospasm ([place]) 10/07/2020   Depression    Elevated serum creatinine 08/28/2019   Homelessness 06/19/2020   Hypertension    NSTEMI (non-ST elevated myocardial infarction) [facility])    Status post incision and drainage 04/22/2021   Suicidal ideation 06/05/2020    Past Surgical History:  Procedure Laterality Date   APPENDECTOMY     EYE SURGERY     LEFT HEART CATH AND CORONARY ANGIOGRAPHY N/A 06/05/2020   Procedure: LEFT HEART CATH AND CORONARY ANGIOGRAPHY;  Surgeon: Nigel Mormon, MD;  Location: [facility];  Service: Cardiovascular;  Laterality: N/A;   PROSTATE SURGERY     Family History:  Family History  Problem Relation Age of Onset   Hypertension Mother    Diabetes Mother    Hypertension Father    Hypertension Sister    Family Psychiatric  History: Unremarkable Social  History:  Social History   Substance and Sexual Activity  Alcohol Use Yes   Comment: drinks 2x/wk, 2-3 40 oz drinks, last drink 2 weeks ago     Social History   Substance and Sexual Activity  Drug Use Not Currently   Types: Cocaine   Comment: cocaine use once a month, last use 6 mos ago    Social History   Socioeconomic History   Marital status: Single    Spouse name: Not on file   Number of children: Not on file   Years of education: Not on file   Highest education level: Not on file  Occupational History   Not on file  Tobacco Use   Smoking status: Some Days    Types: Cigarettes    Last attempt to quit: 12/25/2020    Years since quitting: 2.0   Smokeless tobacco: Never   Tobacco comments:    Smokes a couple cigarettes every now and then  Vaping Use   Vaping Use: Never used  Substance and Sexual Activity   Alcohol use: Yes    Comment: drinks 2x/wk, 2-3 40 oz drinks, last drink 2 weeks ago   Drug use: Not Currently    Types: Cocaine    Comment: cocaine use once a month, last use 6 mos ago   Sexual activity: Not Currently  Other Topics Concern   Not on file  Social History Narrative   His mother passed at age 3 (when he was 89 years old) and  his father raised him and his siblings   His parents were pastors as a lot of his sisters and other family members are today   There was a total of 13 children in his family Had 55 sisters, one sister deceased , Had 3 brothers, all brothers deceased   He is the youngest of the 10 and was "always in trouble in my younger years"   Family still live in [place], [place], some in [place], [place]   Has a Daughter and son with a total of 10 grandchildren (23 grand daughters local and 70 grandsons)    Values family   Married twice, present wife incarcerated   Social Determinants of Health   Financial Resource Strain: [place]  (10/19/2022)   Overall Financial Resource Strain (CARDIA)    Difficulty of Paying Living Expenses: Not hard  at all  Food Insecurity: No Food Insecurity (01/11/2023)   Hunger Vital Sign    Worried About Running Out of Food in the Last Year: Never true    [place] in the Last Year: Never true  Transportation Needs: No Transportation Needs (01/11/2023)   PRAPARE - Hydrologist (Medical): No    Lack of Transportation (Non-Medical): No  Physical Activity: Inactive (10/19/2022)   Exercise Vital Sign    Days of Exercise per Week: 0 days    Minutes of Exercise per Session: 0 min  Stress: No Stress Concern Present (10/19/2022)   [place]    Feeling of Stress : Not at all  Social Connections: Moderately Integrated (03/31/2022)   Social Connection and Isolation Panel [NHANES]    Frequency of Communication with Friends and Family: Three times a week    Frequency of Social Gatherings with Friends and Family: Three times a week    Attends Religious Services: 1 to 4 times per year    Active Member of Clubs or Organizations: Yes    Attends Archivist Meetings: 1 to 4 times per year    Marital Status: Never married   Additional Social History:                         Sleep: Good  Appetite:  Good  Current Medications: Current Facility-Administered Medications  Medication Dose Route Frequency Provider Last Rate Last Admin   acetaminophen (TYLENOL) tablet 650 mg  650 mg Oral Q6H PRN Parks Ranger, DO       albuterol (PROVENTIL) (2.5 MG/3ML) 0.083% nebulizer solution 2.5 mg  2.5 mg Inhalation Q6H PRN Parks Ranger, DO       alum & mag hydroxide-simeth (MAALOX/MYLANTA) 200-200-20 MG/5ML suspension 30 mL  30 mL Oral Q4H PRN Parks Ranger, DO       ARIPiprazole (ABILIFY) tablet 15 mg  15 mg Oral Daily Parks Ranger, DO   15 mg at 01/18/23 [id number]   atorvastatin (LIPITOR) tablet 80 mg  80 mg Oral Daily Parks Ranger, DO   80 mg at 01/18/23 [id number]    diltiazem (CARDIZEM CD) 24 hr capsule 300 mg  300 mg Oral Daily Parks Ranger, DO   300 mg at 01/18/23 [id number]   doxepin (SINEQUAN) capsule 50 mg  50 mg Oral QHS Parks Ranger, DO   50 mg at 01/17/23 2117   latanoprost (XALATAN) 0.005 % ophthalmic solution 1 drop  1 drop Both Eyes QHS Parks Ranger, DO  1 drop at 01/17/23 2117   LORazepam (ATIVAN) tablet 1 mg  1 mg Oral Q4H PRN Parks Ranger, DO       magnesium hydroxide (MILK OF MAGNESIA) suspension 30 mL  30 mL Oral Daily PRN Parks Ranger, DO   30 mL at 01/11/23 1749   mirtazapine (REMERON) tablet 30 mg  30 mg Oral QHS Clapacs, John T, MD   30 mg at 01/17/23 2117   mometasone-formoterol (DULERA) 200-5 MCG/ACT inhaler 2 puff  2 puff Inhalation BID Parks Ranger, DO   2 puff at 01/18/23 0927   OLANZapine (ZYPREXA) tablet 10 mg  10 mg Oral Q6H PRN Parks Ranger, DO       QUEtiapine (SEROQUEL) tablet 100 mg  100 mg Oral QHS PRN Parks Ranger, DO   100 mg at 01/11/23 2106   senna-docusate (Senokot-S) tablet 2 tablet  2 tablet Oral Daily Parks Ranger, DO   2 tablet at 01/18/23 [id number]    Lab Results: No results found for this or any previous visit (from the past 48 hour(s)).  Blood Alcohol level:  Lab Results  Component Value Date   ETH <10 01/10/2023   ETH <10 [id number]    Metabolic Disorder Labs: Lab Results  Component Value Date   HGBA1C 5.2 01/13/2023   MPG 103 01/13/2023   MPG 91.06 09/07/2020   No results found for: "PROLACTIN" Lab Results  Component Value Date   CHOL 168 01/13/2023   TRIG 62 01/13/2023   HDL 57 01/13/2023   CHOLHDL 2.9 01/13/2023   VLDL 12 01/13/2023   LDLCALC 99 01/13/2023   LDLCALC 104 (H) 05/17/2022    Physical Findings: AIMS:  , ,  ,  ,    CIWA:    COWS:     Musculoskeletal: Strength & Muscle Tone: within normal limits Gait & Station: normal Patient leans: N/A  Psychiatric Specialty Exam:  Presentation   General Appearance:  Appropriate for Environment  Eye Contact: Good  Speech: Clear and Coherent  Speech Volume: Normal  Handedness: Right   Mood and Affect  Mood: Depressed  Affect: Congruent; Flat   Thought Process  Thought Processes: Coherent  Descriptions of Associations:Intact  Orientation:Full (Time, Place and Person)  Thought Content:Logical  History of Schizophrenia/Schizoaffective disorder:No  Duration of Psychotic Symptoms:N/A  Hallucinations:No data recorded Ideas of Reference:None  Suicidal Thoughts:No data recorded Homicidal Thoughts:No data recorded  Sensorium  Memory: Immediate Fair; Recent Fair  Judgment: Fair  Insight: Fair   Community education officer  Concentration: Fair  Attention Span: Fair  Recall: [employer] of Knowledge: Fair  Language: Fair   Psychomotor Activity  Psychomotor Activity:No data recorded  Assets  Assets: Desire for Improvement; Leisure Time; Physical Health; Resilience   Sleep  Sleep:No data recorded   Physical Exam: Physical Exam Vitals and nursing note reviewed.  Constitutional:      Appearance: Normal appearance. He is normal weight.  Neurological:     General: No focal deficit present.     Mental Status: He is alert and oriented to person, place, and time.  Psychiatric:        Attention and Perception: Attention and perception normal.        Mood and Affect: Mood is depressed. Affect is flat.        Speech: Speech normal.        Behavior: Behavior normal. Behavior is cooperative.        Thought Content: Thought content normal.  Cognition and Memory: Cognition and memory normal.        Judgment: Judgment normal.    Review of Systems  Constitutional: Negative.   HENT: Negative.    Eyes: Negative.   Respiratory: Negative.    Cardiovascular: Negative.   Gastrointestinal: Negative.   Genitourinary: Negative.   Musculoskeletal: Negative.   Skin: Negative.    Neurological: Negative.   Endo/Heme/Allergies: Negative.   Psychiatric/Behavioral: Negative.     Blood pressure (!) 129/106, pulse 73, temperature 97.6 F (36.4 C), temperature source Oral, resp. rate 18, height 6\' 4"  (1.93 m), weight 116.6 kg, SpO2 99 %. Body mass index is 31.29 kg/m.   Treatment Plan Summary: Daily contact with patient to assess and evaluate symptoms and progress in treatment, Medication management, and Plan continue current medications.  [place], DO 01/18/2023, 1:48 PM

## 2023-01-18 NOTE — Group Note (Signed)
[facility] LCSW Group Therapy Note   Group Date: 01/18/2023 Start Time: 1300 End Time: 1400   Type of Therapy/Topic:  Group Therapy:  Emotion Regulation  Participation Level:  Active    Description of Group:    The purpose of this group is to assist patients in learning to regulate negative emotions and experience positive emotions. Patients will be guided to discuss ways in which they have been vulnerable to their negative emotions. These vulnerabilities will be juxtaposed with experiences of positive emotions or situations, and patients challenged to use positive emotions to combat negative ones. Special emphasis will be placed on coping with negative emotions in conflict situations, and patients will process healthy conflict resolution skills.  Therapeutic Goals: Patient will identify two positive emotions or experiences to reflect on in order to balance out negative emotions:  Patient will label two or more emotions that they find the most difficult to experience:  Patient will be able to demonstrate positive conflict resolution skills through discussion or role plays:   Summary of Patient Progress:   Patient was present for the entirety of the group session. Patient was an active listener and participated in the topic of discussion,was of pleasant mood, provided helpful advice to others, and added nuance to topic of conversation. He said that he is "feeling much better" since the provider upped his dosage on his medication. He said when he gets home he wants to include his family in helping maintain his mental recovery.     Therapeutic Modalities:   Cognitive Behavioral Therapy Feelings Identification Dialectical Behavioral Therapy   Savannha Welle A [place], LCSWA

## 2023-01-18 NOTE — Progress Notes (Signed)
   01/18/23 0805  Psych Admission Type (Psych Patients Only)  Admission Status Voluntary  Psychosocial Assessment  Patient Complaints None  Eye Contact Fair  Facial Expression Flat  Affect Sullen  Speech Logical/coherent  Interaction Assertive  Motor Activity Slow  Appearance/Hygiene In scrubs  Behavior Characteristics Cooperative  Mood Pleasant  Thought Process  Coherency WDL  Content WDL  Delusions None reported or observed  Perception WDL  Hallucination None reported or observed  Judgment WDL  Confusion None  Danger to Self  Current suicidal ideation? Denies  Danger to Others  Danger to Others None reported or observed

## 2023-01-18 NOTE — Group Note (Signed)
Date:  01/18/2023 Time:  4:44 AM  Group Topic/Focus:  Developing a Wellness Toolbox:   The focus of this group is to help patients develop a "wellness toolbox" with skills and strategies to promote recovery upon discharge.    Participation Level:  Active  Participation Quality:  Appropriate  Affect:  Appropriate  Cognitive:  Alert  Insight: Appropriate  Engagement in Group:  Engaged  Modes of Intervention:  Discussion  Additional Comments:    Bradd Canary 01/18/2023, 4:44 AM

## 2023-01-18 NOTE — Plan of Care (Signed)
  Problem: Education: Goal: Knowledge of General Education information will improve Description: Including pain rating scale, medication(s)/side effects and non-pharmacologic comfort measures Outcome: Progressing   Problem: Health Behavior/Discharge Planning: Goal: Ability to manage health-related needs will improve Outcome: Progressing   Problem: Clinical Measurements: Goal: Ability to maintain clinical measurements within normal limits will improve Outcome: Progressing Goal: Will remain free from infection Outcome: Progressing Goal: Diagnostic test results will improve Outcome: Progressing Goal: Respiratory complications will improve Outcome: Progressing Goal: Cardiovascular complication will be avoided Outcome: Progressing   Problem: Coping: Goal: Level of anxiety will decrease Outcome: Progressing   Problem: Nutrition: Goal: Adequate nutrition will be maintained Outcome: Progressing   Problem: Elimination: Goal: Will not experience complications related to bowel motility Outcome: Progressing Goal: Will not experience complications related to urinary retention Outcome: Progressing   Problem: Pain Managment: Goal: General experience of comfort will improve Outcome: Progressing   Problem: Safety: Goal: Ability to remain free from injury will improve Outcome: Progressing   Problem: Skin Integrity: Goal: Risk for impaired skin integrity will decrease Outcome: Progressing   Problem: Education: Goal: Knowledge of [place] General Education information/materials will improve Outcome: Progressing Goal: Emotional status will improve Outcome: Progressing Goal: Mental status will improve Outcome: Progressing Goal: Verbalization of understanding the information provided will improve Outcome: Progressing   Problem: Activity: Goal: Interest or engagement in activities will improve Outcome: Progressing Goal: Sleeping patterns will improve Outcome: Progressing    Problem: Coping: Goal: Ability to verbalize frustrations and anger appropriately will improve Outcome: Progressing Goal: Ability to demonstrate self-control will improve Outcome: Progressing   Problem: Self-Concept: Goal: Will verbalize positive feelings about self Outcome: Progressing Goal: Level of anxiety will decrease Outcome: Progressing   Problem: Safety: Goal: Ability to disclose and discuss suicidal ideas will improve Outcome: Progressing Goal: Ability to identify and utilize support systems that promote safety will improve Outcome: Progressing

## 2023-01-18 NOTE — Progress Notes (Addendum)
   01/18/23 1930  Psych Admission Type (Psych Patients Only)  Admission Status Voluntary  Psychosocial Assessment  Patient Complaints Anxiety;Depression  Eye Contact Fair  Facial Expression Flat  Affect Flat  Speech Logical/coherent  Interaction Assertive  Motor Activity Slow  Appearance/Hygiene In scrubs  Behavior Characteristics Cooperative;Appropriate to situation  Mood Pleasant  Thought Process  Coherency WDL  Content WDL  Delusions None reported or observed  Perception WDL  Hallucination None reported or observed  Judgment WDL  Confusion None  Danger to Self  Current suicidal ideation? Denies  Agreement Not to Harm Self Yes  Description of Agreement verbal  Danger to Others  Danger to Others None reported or observed   Progress note   D: Pt seen in dayroom. Pt denies SI, HI, AVH. Contracts for safety. Pt rates pain  0/10. Pt rates anxiety  5/10 and depression  5/10. Pt states that he may be discharged on Friday. Pt states he is ready to start again. "I don't want to go back to that life I was in before." Pt agrees that he will let his daughter and his grandchildren help him. "I need to be around for them."  Pt did complain of some blurry vision. "It happens sometimes. That's why I was taking the drops. I'm gonna have to see the eye doctor when I get out of here." Pt denies pressure behind the eyes and pain. No other concerns noted at this time.  A: Pt provided support and encouragement. Pt given scheduled medication as prescribed. PRNs as appropriate. Q15 min checks for safety.   R: Pt safe on the unit. Will continue to monitor.

## 2023-01-19 DIAGNOSIS — F332 Major depressive disorder, recurrent severe without psychotic features: Secondary | ICD-10-CM | POA: Diagnosis not present

## 2023-01-19 NOTE — Group Note (Signed)
LCSW Group Therapy Note  Group Date: 01/19/2023 Start Time: 1300 End Time: 1400   Type of Therapy and Topic:  Group Therapy - How To Cope with Nervousness about Discharge   Participation Level:  Active   Description of Group This process group involved identification of patients' feelings about discharge. Some of them are scheduled to be discharged soon, while others are new admissions, but each of them was asked to share thoughts and feelings surrounding discharge from the hospital. One common theme was that they are excited at the prospect of going home, while another was that many of them are apprehensive about sharing why they were hospitalized. Patients were given the opportunity to discuss these feelings with their peers in preparation for discharge.  Therapeutic Goals  Patient will identify their overall feelings about pending discharge. Patient will think about how they might proactively address issues that they believe will once again arise once they get home (i.e. with parents). Patients will participate in discussion about having hope for change.   Summary of Patient Progress:   He was very active throughout the session. He demonstrated fair insight into the subject matter, and proved open to input from peers and feedback from [place]. He was respectful of peers and participated throughout the entire session. He stated "he couldn't wait to see his "grandkids" when he returns home. He said he was not anxious about anything but is looking forward to celebrating Easter with his family.    Therapeutic Modalities Cognitive Behavioral Therapy   Yosef Krogh A [place], LCSWA 01/19/2023  2:06 PM

## 2023-01-19 NOTE — Plan of Care (Signed)
  Problem: Education: Goal: Knowledge of General Education information will improve Description: Including pain rating scale, medication(s)/side effects and non-pharmacologic comfort measures Outcome: Progressing   Problem: Health Behavior/Discharge Planning: Goal: Ability to manage health-related needs will improve Outcome: Progressing   Problem: Clinical Measurements: Goal: Ability to maintain clinical measurements within normal limits will improve Outcome: Progressing Goal: Will remain free from infection Outcome: Progressing Goal: Diagnostic test results will improve Outcome: Progressing Goal: Respiratory complications will improve Outcome: Progressing Goal: Cardiovascular complication will be avoided Outcome: Progressing   Problem: Activity: Goal: Risk for activity intolerance will decrease Outcome: Progressing   Problem: Nutrition: Goal: Adequate nutrition will be maintained Outcome: Progressing   Problem: Coping: Goal: Level of anxiety will decrease Outcome: Progressing   Problem: Elimination: Goal: Will not experience complications related to bowel motility Outcome: Progressing Goal: Will not experience complications related to urinary retention Outcome: Progressing   Problem: Pain Managment: Goal: General experience of comfort will improve Outcome: Progressing   Problem: Safety: Goal: Ability to remain free from injury will improve Outcome: Progressing   Problem: Self-Concept: Goal: Will verbalize positive feelings about self Outcome: Progressing Goal: Level of anxiety will decrease Outcome: Progressing   Problem: Safety: Goal: Ability to disclose and discuss suicidal ideas will improve Outcome: Progressing Goal: Ability to identify and utilize support systems that promote safety will improve Outcome: Progressing   Problem: Role Relationship: Goal: Will demonstrate positive changes in social behaviors and relationships Outcome: Progressing   

## 2023-01-19 NOTE — Progress Notes (Signed)
   01/19/23 0900  Psych Admission Type (Psych Patients Only)  Admission Status Voluntary  Psychosocial Assessment  Eye Contact Fair  Facial Expression Flat  Affect Sullen  Speech Logical/coherent  Interaction Assertive  Motor Activity Slow  Appearance/Hygiene In scrubs  Thought Process  Coherency WDL  Content WDL  Delusions None reported or observed  Perception WDL  Hallucination None reported or observed  Judgment WDL  Confusion None  Danger to Self  Current suicidal ideation? Denies  Danger to Others  Danger to Others None reported or observed

## 2023-01-19 NOTE — Progress Notes (Signed)
   01/19/23 2000  Psych Admission Type (Psych Patients Only)  Admission Status Voluntary  Psychosocial Assessment  Patient Complaints None  Eye Contact Fair  Facial Expression Flat  Affect Flat  Speech Logical/coherent  Interaction Assertive  Motor Activity Other (Comment) (wnl)  Appearance/Hygiene In scrubs  Behavior Characteristics Cooperative;Appropriate to situation  Mood Pleasant  Thought Process  Coherency WDL  Content WDL  Delusions None reported or observed  Perception WDL  Hallucination None reported or observed  Judgment WDL  Confusion None  Danger to Self  Current suicidal ideation? Denies  Agreement Not to Harm Self Yes  Description of Agreement verbal  Danger to Others  Danger to Others None reported or observed   Progress note   D: Pt seen in dayroom. Pt denies SI, HI, AVH. Pt rates pain  0/10. Pt rates anxiety  0/10 and depression  0/10. Pt states he is looking forward to discharge. Pt wants to spend time with his daughter and grandchildren. Pt still having some blurry vision but says this was going on before admission and that he needs to see an eye doctor once discharged. Eating and sleeping well. Attending groups. No other concerns noted at this time.  A: Pt provided support and encouragement. Pt given scheduled medication as prescribed. PRNs as appropriate. Q15 min checks for safety.   R: Pt safe on the unit. Will continue to monitor.

## 2023-01-19 NOTE — Progress Notes (Signed)
[facility] MD Progress Note  01/19/2023 12:10 PM Robert Chan  MRN:  [name] Subjective: Robert Chan is seen on rounds.  He states that he is feeling better.  We talked about discharge on Friday.  He says that he thinks that he will be ready at that time.  Social work is working on his follow-up appointments.  Has been compliant with medications.  He feels like they have been helping.  He denies any side effects.  He is able to contract for safety. Principal Problem: MDD (major depressive disorder), recurrent severe, without psychosis ([place]) Diagnosis: Principal Problem:   MDD (major depressive disorder), recurrent severe, without psychosis ([place])  Total Time spent with patient: 15 minutes  Past Psychiatric History: History of depression and polysubstance abuse.  Past Medical History:  Past Medical History:  Diagnosis Date   Acute bilateral low back pain without sciatica 02/27/2021   Asthma    CAD (coronary artery disease)    Cataract    CKD (chronic kidney disease)    Cocaine use disorder, mild, abuse (HCC)    COPD (chronic obstructive pulmonary disease) (HCC)    Coronary vasospasm ([place]) 10/07/2020   Depression    Elevated serum creatinine 08/28/2019   Homelessness 06/19/2020   Hypertension    NSTEMI (non-ST elevated myocardial infarction) [facility])    Status post incision and drainage 04/22/2021   Suicidal ideation 06/05/2020    Past Surgical History:  Procedure Laterality Date   APPENDECTOMY     EYE SURGERY     LEFT HEART CATH AND CORONARY ANGIOGRAPHY N/A 06/05/2020   Procedure: LEFT HEART CATH AND CORONARY ANGIOGRAPHY;  Surgeon: Nigel Mormon, MD;  Location: [place] [facility];  Service: Cardiovascular;  Laterality: N/A;   PROSTATE SURGERY     Family History:  Family History  Problem Relation Age of Onset   Hypertension Mother    Diabetes Mother    Hypertension Father    Hypertension Sister    Family Psychiatric  History: Unremarkable Social History:  Social History    Substance and Sexual Activity  Alcohol Use Yes   Comment: drinks 2x/wk, 2-3 40 oz drinks, last drink 2 weeks ago     Social History   Substance and Sexual Activity  Drug Use Not Currently   Types: Cocaine   Comment: cocaine use once a month, last use 6 mos ago    Social History   Socioeconomic History   Marital status: Single    Spouse name: Not on file   Number of children: Not on file   Years of education: Not on file   Highest education level: Not on file  Occupational History   Not on file  Tobacco Use   Smoking status: Some Days    Types: Cigarettes    Last attempt to quit: 12/25/2020    Years since quitting: 2.0   Smokeless tobacco: Never   Tobacco comments:    Smokes a couple cigarettes every now and then  Vaping Use   Vaping Use: Never used  Substance and Sexual Activity   Alcohol use: Yes    Comment: drinks 2x/wk, 2-3 40 oz drinks, last drink 2 weeks ago   Drug use: Not Currently    Types: Cocaine    Comment: cocaine use once a month, last use 6 mos ago   Sexual activity: Not Currently  Other Topics Concern   Not on file  Social History Narrative   His mother passed at age 42 (when he was 80 years old) and  his father raised him and his siblings   His parents were pastors as a lot of his sisters and other family members are today   There was a total of 13 children in his family Had 28 sisters, one sister deceased , Had 3 brothers, all brothers deceased   He is the youngest of the 41 and was "always in trouble in my younger years"   Family still live in [place], [place], some in [place], [place]   Has a Daughter and son with a total of 10 grandchildren (62 grand daughters local and 57 grandsons)    Values family   Married twice, present wife incarcerated   Social Determinants of Health   Financial Resource Strain: [place]  (10/19/2022)   Overall Financial Resource Strain (CARDIA)    Difficulty of Paying Living Expenses: Not hard at all  Food Insecurity:  No Food Insecurity (01/11/2023)   Hunger Vital Sign    Worried About Running Out of Food in the Last Year: Never true    [place] in the Last Year: Never true  Transportation Needs: No Transportation Needs (01/11/2023)   PRAPARE - Hydrologist (Medical): No    Lack of Transportation (Non-Medical): No  Physical Activity: Inactive (10/19/2022)   Exercise Vital Sign    Days of Exercise per Week: 0 days    Minutes of Exercise per Session: 0 min  Stress: No Stress Concern Present (10/19/2022)   [place]    Feeling of Stress : Not at all  Social Connections: Moderately Integrated (03/31/2022)   Social Connection and Isolation Panel [NHANES]    Frequency of Communication with Friends and Family: Three times a week    Frequency of Social Gatherings with Friends and Family: Three times a week    Attends Religious Services: 1 to 4 times per year    Active Member of Clubs or Organizations: Yes    Attends Archivist Meetings: 1 to 4 times per year    Marital Status: Never married   Additional Social History:                         Sleep: Good  Appetite:  Good  Current Medications: Current Facility-Administered Medications  Medication Dose Route Frequency Provider Last Rate Last Admin   acetaminophen (TYLENOL) tablet 650 mg  650 mg Oral Q6H PRN Parks Ranger, DO       albuterol (PROVENTIL) (2.5 MG/3ML) 0.083% nebulizer solution 2.5 mg  2.5 mg Inhalation Q6H PRN Parks Ranger, DO       alum & mag hydroxide-simeth (MAALOX/MYLANTA) 200-200-20 MG/5ML suspension 30 mL  30 mL Oral Q4H PRN Parks Ranger, DO       ARIPiprazole (ABILIFY) tablet 15 mg  15 mg Oral Daily Parks Ranger, DO   15 mg at 01/19/23 0851   atorvastatin (LIPITOR) tablet 80 mg  80 mg Oral Daily Parks Ranger, DO   80 mg at 01/19/23 0850   diltiazem (CARDIZEM  CD) 24 hr capsule 300 mg  300 mg Oral Daily Parks Ranger, DO   300 mg at 01/19/23 0851   doxepin (SINEQUAN) capsule 50 mg  50 mg Oral QHS Parks Ranger, DO   50 mg at 01/18/23 2134   latanoprost (XALATAN) 0.005 % ophthalmic solution 1 drop  1 drop Both Eyes QHS Parks Ranger, DO  1 drop at 01/18/23 2133   LORazepam (ATIVAN) tablet 1 mg  1 mg Oral Q4H PRN Parks Ranger, DO       magnesium hydroxide (MILK OF MAGNESIA) suspension 30 mL  30 mL Oral Daily PRN Parks Ranger, DO   30 mL at 01/11/23 1749   mirtazapine (REMERON) tablet 30 mg  30 mg Oral QHS Clapacs, John T, MD   30 mg at 01/18/23 2133   mometasone-formoterol (DULERA) 200-5 MCG/ACT inhaler 2 puff  2 puff Inhalation BID Parks Ranger, DO   2 puff at 01/19/23 0852   OLANZapine (ZYPREXA) tablet 10 mg  10 mg Oral Q6H PRN Parks Ranger, DO       QUEtiapine (SEROQUEL) tablet 100 mg  100 mg Oral QHS PRN Parks Ranger, DO   100 mg at 01/11/23 2106   senna-docusate (Senokot-S) tablet 2 tablet  2 tablet Oral Daily Parks Ranger, DO   2 tablet at 01/19/23 [id number]    Lab Results: No results found for this or any previous visit (from the past 48 hour(s)).  Blood Alcohol level:  Lab Results  Component Value Date   ETH <10 01/10/2023   ETH <10 [id number]    Metabolic Disorder Labs: Lab Results  Component Value Date   HGBA1C 5.2 01/13/2023   MPG 103 01/13/2023   MPG 91.06 09/07/2020   No results found for: "PROLACTIN" Lab Results  Component Value Date   CHOL 168 01/13/2023   TRIG 62 01/13/2023   HDL 57 01/13/2023   CHOLHDL 2.9 01/13/2023   VLDL 12 01/13/2023   LDLCALC 99 01/13/2023   LDLCALC 104 (H) 05/17/2022    Physical Findings: AIMS:  , ,  ,  ,    CIWA:    COWS:     Musculoskeletal: Strength & Muscle Tone: within normal limits Gait & Station: normal Patient leans: N/A  Psychiatric Specialty Exam:  Presentation  General Appearance:   Appropriate for Environment  Eye Contact: Good  Speech: Clear and Coherent  Speech Volume: Normal  Handedness: Right   Mood and Affect  Mood: Depressed  Affect: Congruent; Flat   Thought Process  Thought Processes: Coherent  Descriptions of Associations:Intact  Orientation:Full (Time, Place and Person)  Thought Content:Logical  History of Schizophrenia/Schizoaffective disorder:No  Duration of Psychotic Symptoms:N/A  Hallucinations:No data recorded Ideas of Reference:None  Suicidal Thoughts:No data recorded Homicidal Thoughts:No data recorded  Sensorium  Memory: Immediate Fair; Recent Fair  Judgment: Fair  Insight: Fair   Community education officer  Concentration: Fair  Attention Span: Fair  Recall: [employer] of Knowledge: Fair  Language: Fair   Psychomotor Activity  Psychomotor Activity:No data recorded  Assets  Assets: Desire for Improvement; Leisure Time; Physical Health; Resilience   Sleep  Sleep:No data recorded   Physical Exam: Physical Exam Vitals and nursing note reviewed.  Constitutional:      Appearance: Normal appearance. He is normal weight.  Neurological:     General: No focal deficit present.     Mental Status: He is alert and oriented to person, place, and time.  Psychiatric:        Attention and Perception: Attention and perception normal.        Mood and Affect: Mood is depressed. Affect is flat.        Speech: Speech normal.        Behavior: Behavior is withdrawn.        Thought Content: Thought content normal.  Cognition and Memory: Cognition and memory normal.        Judgment: Judgment normal.    Review of Systems  Constitutional: Negative.   HENT: Negative.    Eyes: Negative.   Respiratory: Negative.    Cardiovascular: Negative.   Gastrointestinal: Negative.   Genitourinary: Negative.   Musculoskeletal: Negative.   Skin: Negative.   Neurological: Negative.   Endo/Heme/Allergies:  Negative.   Psychiatric/Behavioral:  Positive for depression.    Blood pressure 117/64, pulse 72, temperature 97.7 F (36.5 C), temperature source Oral, resp. rate 18, height 6\' 4"  (1.93 m), weight 116.6 kg, SpO2 100 %. Body mass index is 31.29 kg/m.   Treatment Plan Summary: Daily contact with patient to assess and evaluate symptoms and progress in treatment, Medication management, and Plan continue current medications.  I did discharge Friday.  Parks Ranger, DO 01/19/2023, 12:10 PM

## 2023-01-19 NOTE — Progress Notes (Signed)
D: Patient alert and oriented times 4. Pt denies SI, HI, AVH. He denies pain. Pt denies anxiety and depression. No other concerns noted at this time.   A: Pt provided support and encouragement throughout the day. Scheduled medications given as prescribed. Takes medications whole without issue.    R: Pt remains safe on the unit with Q15 min safety checks in place. Will continue to monitor for changes.

## 2023-01-19 NOTE — Group Note (Signed)
Recreation Therapy Group Note   Group Topic:General Recreation  Group Date: 01/19/2023 Start Time: 1400 End Time: 1450 Facilitators: Vilma Prader, LRT, CTRS Location: Dayroom  Group Description: Rummy. LRT and patients played the card game "Rummy" with music playing in the background. LRT and pts discussed how they have or have not ever played card games, where they use to play, and who they use to play with. LRT an pts discussed how this could be a leisure interest post-discharge.   Affect/Mood: Appropriate   Participation Level: Active and Engaged   Participation Quality: Independent   Behavior: Appropriate, Attentive , and Calm   Speech/Thought Process: Coherent   Insight: Good   Judgement: Good   Modes of Intervention: Competitive Play   Patient Response to Interventions:  Attentive, Engaged, Interested , and Receptive   Education Outcome:  Acknowledges education   Clinical Observations/Individualized Feedback: Robert Chan was active in their participation of session activities and group discussion. Pt shared that he use to play card games with his "buddies for money back in the day". Pt interacted well with peers and LRT duration of session.   Plan: Continue to engage patient in RT group sessions 2-3x/week.   Vilma Prader, LRT, [place] 01/19/2023 2:56 PM

## 2023-01-20 DIAGNOSIS — F332 Major depressive disorder, recurrent severe without psychotic features: Secondary | ICD-10-CM | POA: Diagnosis not present

## 2023-01-20 NOTE — Group Note (Signed)
Date:  01/20/2023 Time:  3:18 PM  Group Topic/Focus:  Mediation/meditation, gratitude group     Participation Level:  Active  Participation Quality:  Appropriate and Attentive  Affect:  Appropriate  Cognitive:  Alert and Appropriate  Insight: Appropriate and Good  Engagement in Group:  Engaged  Modes of Intervention:  Activity and Discussion  Additional Comments:    Ladona Mow 01/20/2023, 3:18 PM

## 2023-01-20 NOTE — Progress Notes (Signed)
Patient present in the  dayroom for breakfast.  Good appetite.  Patient reports he slept pretty good.  Denies SI/HI and AVH.  Denies feelings of anxiety or depression.  Compliant with scheduled medications.  15 min checks in place for patent safety.  Patient is calm and cooperative with staff.  Attended MHT, SW and RT group.  Actively participated. Appropriate interaction with peers.

## 2023-01-20 NOTE — BH Assessment (Signed)
1905 Received patient sitting in the day room at the table. He is alert and engaged in conversation with his peers. Will continue to monitor patient for safety.  2030 Patient at the nurse's station requesting water. He remains alert and is oriented x4. He is denying SI/HI, depression, anxiety, and rate his mood as "Good." He has a bright affect and is making jokes and sharing some positive events in his growing up. Will continue to monitor patient for safety.  2200 Patient received all medications and is now in bed. Will continue to monitor patient for safety.  0200 Patient continues to rest quietly in bed with eyes closed.  0530 Patient remains asleep. Will continue to monitor for safety.  [id number] Remains asleep.

## 2023-01-20 NOTE — Group Note (Signed)
[facility] LCSW Group Therapy Note   Group Date: 01/20/2023 Start Time: 1300 End Time: 1400   Type of Therapy/Topic:  Group Therapy:  Balance in Life  Participation Level:  Active   Description of Group:    This group will address the concept of balance and how it feels and looks when one is unbalanced. Patients will be encouraged to process areas in their lives that are out of balance, and identify reasons for remaining unbalanced. Facilitators will guide patients utilizing problem- solving interventions to address and correct the stressor making their life unbalanced. Understanding and applying boundaries will be explored and addressed for obtaining  and maintaining a balanced life. Patients will be encouraged to explore ways to assertively make their unbalanced needs known to significant others in their lives, using other group members and facilitator for support and feedback.  Therapeutic Goals: Patient will identify two or more emotions or situations they have that consume much of in their lives. Patient will identify signs/triggers that life has become out of balance:  Patient will identify two ways to set boundaries in order to achieve balance in their lives:  Patient will demonstrate ability to communicate their needs through discussion and/or role plays  Summary of Patient Progress:    Patient was present for the entirety of the group session. Patient was an active listener and participated in the topic of discussion, provided helpful advice to others, and added nuance to topic of conversation. He said that he "always wants to do things on his own" but doing the activity helped him see he wants to have more balance by including his family.     Therapeutic Modalities:   Cognitive Behavioral Therapy Solution-Focused Therapy Assertiveness Training   [place], LCSWA

## 2023-01-20 NOTE — Progress Notes (Signed)
[facility] MD Progress Note  01/20/2023 1:11 PM Robert Chan  MRN:  [name] Subjective: Robert Chan is seen on rounds.  He states he slept well.  His mood is improving.  He has been compliant with medications.  He denies any side effects.  He denies any suicidal ideation.  He states that he is ready to go tomorrow.  Social work is working on his follow-up.  Principal Problem: MDD (major depressive disorder), recurrent severe, without psychosis ([place]) Diagnosis: Principal Problem:   MDD (major depressive disorder), recurrent severe, without psychosis ([place])  Total Time spent with patient: 15 minutes  Past Psychiatric History: History of depression and polysubstance abuse.  Past Medical History:  Past Medical History:  Diagnosis Date   Acute bilateral low back pain without sciatica 02/27/2021   Asthma    CAD (coronary artery disease)    Cataract    CKD (chronic kidney disease)    Cocaine use disorder, mild, abuse (HCC)    COPD (chronic obstructive pulmonary disease) (HCC)    Coronary vasospasm ([place]) 10/07/2020   Depression    Elevated serum creatinine 08/28/2019   Homelessness 06/19/2020   Hypertension    NSTEMI (non-ST elevated myocardial infarction) [facility])    Status post incision and drainage 04/22/2021   Suicidal ideation 06/05/2020    Past Surgical History:  Procedure Laterality Date   APPENDECTOMY     EYE SURGERY     LEFT HEART CATH AND CORONARY ANGIOGRAPHY N/A 06/05/2020   Procedure: LEFT HEART CATH AND CORONARY ANGIOGRAPHY;  Surgeon: Nigel Mormon, MD;  Location: [facility];  Service: Cardiovascular;  Laterality: N/A;   PROSTATE SURGERY     Family History:  Family History  Problem Relation Age of Onset   Hypertension Mother    Diabetes Mother    Hypertension Father    Hypertension Sister    Family Psychiatric  History: Unremarkable Social History:  Social History   Substance and Sexual Activity  Alcohol Use Yes   Comment: drinks 2x/wk, 2-3 40 oz drinks, last  drink 2 weeks ago     Social History   Substance and Sexual Activity  Drug Use Not Currently   Types: Cocaine   Comment: cocaine use once a month, last use 6 mos ago    Social History   Socioeconomic History   Marital status: Single    Spouse name: Not on file   Number of children: Not on file   Years of education: Not on file   Highest education level: Not on file  Occupational History   Not on file  Tobacco Use   Smoking status: Some Days    Types: Cigarettes    Last attempt to quit: 12/25/2020    Years since quitting: 2.0   Smokeless tobacco: Never   Tobacco comments:    Smokes a couple cigarettes every now and then  Vaping Use   Vaping Use: Never used  Substance and Sexual Activity   Alcohol use: Yes    Comment: drinks 2x/wk, 2-3 40 oz drinks, last drink 2 weeks ago   Drug use: Not Currently    Types: Cocaine    Comment: cocaine use once a month, last use 6 mos ago   Sexual activity: Not Currently  Other Topics Concern   Not on file  Social History Narrative   His mother passed at age 58 (when he was 69 years old) and his father raised him and his siblings   His parents were pastors as a lot of  his sisters and other family members are today   There was a total of 13 children in his family Had 21 sisters, one sister deceased , Had 3 brothers, all brothers deceased   He is the youngest of the 84 and was "always in trouble in my younger years"   Family still live in [place], [place], some in [place], [place]   Has a Daughter and son with a total of 10 grandchildren (39 grand daughters local and 29 grandsons)    Values family   Married twice, present wife incarcerated   Social Determinants of Health   Financial Resource Strain: Low Risk  (10/19/2022)   Overall Financial Resource Strain (CARDIA)    Difficulty of Paying Living Expenses: Not hard at all  Food Insecurity: No Food Insecurity (01/11/2023)   Hunger Vital Sign    Worried About Running Out of Food in the  Last Year: Never true    [place] in the Last Year: Never true  Transportation Needs: No Transportation Needs (01/11/2023)   PRAPARE - Hydrologist (Medical): No    Lack of Transportation (Non-Medical): No  Physical Activity: Inactive (10/19/2022)   Exercise Vital Sign    Days of Exercise per Week: 0 days    Minutes of Exercise per Session: 0 min  Stress: No Stress Concern Present (10/19/2022)   [place]    Feeling of Stress : Not at all  Social Connections: Moderately Integrated (03/31/2022)   Social Connection and Isolation Panel [NHANES]    Frequency of Communication with Friends and Family: Three times a week    Frequency of Social Gatherings with Friends and Family: Three times a week    Attends Religious Services: 1 to 4 times per year    Active Member of Clubs or Organizations: Yes    Attends Archivist Meetings: 1 to 4 times per year    Marital Status: Never married   Additional Social History:                         Sleep: Good  Appetite:  Good  Current Medications: Current Facility-Administered Medications  Medication Dose Route Frequency Provider Last Rate Last Admin   acetaminophen (TYLENOL) tablet 650 mg  650 mg Oral Q6H PRN Parks Ranger, DO   650 mg at 01/20/23 1111   albuterol (PROVENTIL) (2.5 MG/3ML) 0.083% nebulizer solution 2.5 mg  2.5 mg Inhalation Q6H PRN Parks Ranger, DO       alum & mag hydroxide-simeth (MAALOX/MYLANTA) 200-200-20 MG/5ML suspension 30 mL  30 mL Oral Q4H PRN Parks Ranger, DO       ARIPiprazole (ABILIFY) tablet 15 mg  15 mg Oral Daily Parks Ranger, DO   15 mg at 01/20/23 [id number]   atorvastatin (LIPITOR) tablet 80 mg  80 mg Oral Daily Parks Ranger, DO   80 mg at 01/20/23 [id number]   diltiazem (CARDIZEM CD) 24 hr capsule 300 mg  300 mg Oral Daily Parks Ranger, DO   300  mg at 01/20/23 [id number]   doxepin (SINEQUAN) capsule 50 mg  50 mg Oral QHS Parks Ranger, DO   50 mg at 01/19/23 2113   latanoprost (XALATAN) 0.005 % ophthalmic solution 1 drop  1 drop Both Eyes QHS Parks Ranger, DO   1 drop at 01/19/23 2113   LORazepam (ATIVAN) tablet 1 mg  1 mg Oral Q4H PRN Parks Ranger, DO       magnesium hydroxide (MILK OF MAGNESIA) suspension 30 mL  30 mL Oral Daily PRN Parks Ranger, DO   30 mL at 01/11/23 1749   mirtazapine (REMERON) tablet 30 mg  30 mg Oral QHS Clapacs, John T, MD   30 mg at 01/19/23 2113   mometasone-formoterol (DULERA) 200-5 MCG/ACT inhaler 2 puff  2 puff Inhalation BID Parks Ranger, DO   2 puff at 01/20/23 0851   OLANZapine (ZYPREXA) tablet 10 mg  10 mg Oral Q6H PRN Parks Ranger, DO       QUEtiapine (SEROQUEL) tablet 100 mg  100 mg Oral QHS PRN Parks Ranger, DO   100 mg at 01/11/23 2106   senna-docusate (Senokot-S) tablet 2 tablet  2 tablet Oral Daily Parks Ranger, DO   2 tablet at 01/20/23 [id number]    Lab Results: No results found for this or any previous visit (from the past 48 hour(s)).  Blood Alcohol level:  Lab Results  Component Value Date   ETH <10 01/10/2023   ETH <10 [id number]    Metabolic Disorder Labs: Lab Results  Component Value Date   HGBA1C 5.2 01/13/2023   MPG 103 01/13/2023   MPG 91.06 09/07/2020   No results found for: "PROLACTIN" Lab Results  Component Value Date   CHOL 168 01/13/2023   TRIG 62 01/13/2023   HDL 57 01/13/2023   CHOLHDL 2.9 01/13/2023   VLDL 12 01/13/2023   LDLCALC 99 01/13/2023   LDLCALC 104 (H) 05/17/2022    Physical Findings: AIMS:  , ,  ,  ,    CIWA:    COWS:     Musculoskeletal: Strength & Muscle Tone: within normal limits Gait & Station: normal Patient leans: N/A  Psychiatric Specialty Exam:  Presentation  General Appearance:  Appropriate for Environment  Eye Contact: Good  Speech: Clear and  Coherent  Speech Volume: Normal  Handedness: Right   Mood and Affect  Mood: Depressed  Affect: Congruent; Flat   Thought Process  Thought Processes: Coherent  Descriptions of Associations:Intact  Orientation:Full (Time, Place and Person)  Thought Content:Logical  History of Schizophrenia/Schizoaffective disorder:No  Duration of Psychotic Symptoms:N/A  Hallucinations:No data recorded Ideas of Reference:None  Suicidal Thoughts:No data recorded Homicidal Thoughts:No data recorded  Sensorium  Memory: Immediate Fair; Recent Fair  Judgment: Fair  Insight: Fair   Community education officer  Concentration: Fair  Attention Span: Fair  Recall: [employer] of Knowledge: Fair  Language: Fair   Psychomotor Activity  Psychomotor Activity:No data recorded  Assets  Assets: Desire for Improvement; Leisure Time; Physical Health; Resilience   Sleep  Sleep:No data recorded   Physical Exam: Physical Exam Vitals and nursing note reviewed.  Constitutional:      Appearance: Normal appearance. He is normal weight.  Neurological:     General: No focal deficit present.     Mental Status: He is alert and oriented to person, place, and time.  Psychiatric:        Attention and Perception: Attention and perception normal.        Mood and Affect: Mood and affect normal.        Speech: Speech normal.        Behavior: Behavior normal. Behavior is cooperative.        Thought Content: Thought content normal.        Cognition and Memory: Cognition and memory normal.  Judgment: Judgment normal.    Review of Systems  Constitutional: Negative.   HENT: Negative.    Eyes: Negative.   Respiratory: Negative.    Cardiovascular: Negative.   Gastrointestinal: Negative.   Genitourinary: Negative.   Musculoskeletal: Negative.   Skin: Negative.   Neurological: Negative.   Endo/Heme/Allergies: Negative.   Psychiatric/Behavioral: Negative.     Blood pressure  131/79, pulse 69, temperature 97.8 F (36.6 C), temperature source Oral, resp. rate 18, height 6\' 4"  (1.93 m), weight 116.6 kg, SpO2 100 %. Body mass index is 31.29 kg/m.   Treatment Plan Summary: Daily contact with patient to assess and evaluate symptoms and progress in treatment, Medication management, and Plan continue current medications.  Plan on discharge tomorrow.  Parks Ranger, DO 01/20/2023, 1:11 PM

## 2023-01-20 NOTE — Plan of Care (Signed)
  Problem: Nutrition: Goal: Adequate nutrition will be maintained Outcome: Progressing   Problem: Coping: Goal: Level of anxiety will decrease Outcome: Progressing   Problem: Pain Managment: Goal: General experience of comfort will improve Outcome: Progressing   
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
## 2023-01-21 DIAGNOSIS — F332 Major depressive disorder, recurrent severe without psychotic features: Secondary | ICD-10-CM | POA: Diagnosis not present

## 2023-01-21 MED ORDER — ARIPIPRAZOLE 15 MG PO TABS: 15.0000 mg | ORAL_TABLET | Freq: Every day | ORAL | 3 refills | Status: DC

## 2023-01-21 MED ORDER — ATORVASTATIN CALCIUM 80 MG PO TABS: 80.0000 mg | ORAL_TABLET | Freq: Every day | ORAL | 1 refills | Status: DC

## 2023-01-21 MED ORDER — DILTIAZEM HCL ER COATED BEADS 300 MG PO CP24: 300.0000 mg | ORAL_CAPSULE | Freq: Every day | ORAL | 3 refills | Status: DC

## 2023-01-21 MED ORDER — MIRTAZAPINE 30 MG PO TABS: 30.0000 mg | ORAL_TABLET | Freq: Every day | ORAL | 3 refills | Status: DC

## 2023-01-21 MED ORDER — DOXEPIN HCL 50 MG PO CAPS: 50.0000 mg | ORAL_CAPSULE | Freq: Every day | ORAL | 1 refills | Status: DC

## 2023-01-21 NOTE — Care Management Important Message (Signed)
Important Message  Patient Details  Name: Robert Chan MRN: [name] Date of Birth: 08/29/1956   Medicare Important Message Given:  Yes     Evon Lopezperez A [place], [place] 01/21/2023, 9:09 AM

## 2023-01-21 NOTE — Discharge Summary (Signed)
Physician Discharge Summary Note  Patient:  Robert Chan is an 67 y.o., male MRN:  [name] DOB:  14-Oct-1956 Patient phone:  [phone] (home)  Patient address:   [address] [place] [address],  Total Time spent with patient: 1 hour  Date of Admission:  01/10/2023 Date of Discharge: 01/21/2023  Reason for Admission:  Robert Chan is a 67 year old African-American male who was discharged from [facility] back in October who presents today with depression and suicidal ideation. He is currently voluntary. He tells me he has not been taking his medications since discharge. He did not have any outpatient follow-up. Has been noncompliant. He does have a history of substance abuse but currently denies any recent use. He endorses anhedonia, difficulty sleeping, depressed mood, hopelessness and helplessness. States that his nephew overdosed and that his brother was killed in a car wreck. He has multiple medical problems including hypertension, COPD, and cardiac disease. He sees Dr. Joya Gaskins at [facility]. He has also been admitted to [facility] behavioral health in the past year.   Principal Problem: MDD (major depressive disorder), recurrent severe, without psychosis ([place]) Discharge Diagnoses: Principal Problem:   MDD (major depressive disorder), recurrent severe, without psychosis ([place])   Past Psychiatric History: Extensive history of pression and polysubstance abuse.  Past Medical History:  Past Medical History:  Diagnosis Date   Acute bilateral low back pain without sciatica 02/27/2021   Asthma    CAD (coronary artery disease)    Cataract    CKD (chronic kidney disease)    Cocaine use disorder, mild, abuse (HCC)    COPD (chronic obstructive pulmonary disease) (HCC)    Coronary vasospasm ([place]) 10/07/2020   Depression    Elevated serum creatinine 08/28/2019   Homelessness 06/19/2020   Hypertension    NSTEMI (non-ST elevated myocardial infarction) [facility])    Status post incision and  drainage 04/22/2021   Suicidal ideation 06/05/2020    Past Surgical History:  Procedure Laterality Date   APPENDECTOMY     EYE SURGERY     LEFT HEART CATH AND CORONARY ANGIOGRAPHY N/A 06/05/2020   Procedure: LEFT HEART CATH AND CORONARY ANGIOGRAPHY;  Surgeon: Nigel Mormon, MD;  Location: [place] [facility];  Service: Cardiovascular;  Laterality: N/A;   PROSTATE SURGERY     Family History:  Family History  Problem Relation Age of Onset   Hypertension Mother    Diabetes Mother    Hypertension Father    Hypertension Sister    Family Psychiatric  History: Unremarkable Social History:  Social History   Substance and Sexual Activity  Alcohol Use Yes   Comment: drinks 2x/wk, 2-3 40 oz drinks, last drink 2 weeks ago     Social History   Substance and Sexual Activity  Drug Use Not Currently   Types: Cocaine   Comment: cocaine use once a month, last use 6 mos ago    Social History   Socioeconomic History   Marital status: Single    Spouse name: Not on file   Number of children: Not on file   Years of education: Not on file   Highest education level: Not on file  Occupational History   Not on file  Tobacco Use   Smoking status: Some Days    Types: Cigarettes    Last attempt to quit: 12/25/2020    Years since quitting: 2.0   Smokeless tobacco: Never   Tobacco comments:    Smokes a couple cigarettes every now and then  Vaping Use  Vaping Use: Never used  Substance and Sexual Activity   Alcohol use: Yes    Comment: drinks 2x/wk, 2-3 40 oz drinks, last drink 2 weeks ago   Drug use: Not Currently    Types: Cocaine    Comment: cocaine use once a month, last use 6 mos ago   Sexual activity: Not Currently  Other Topics Concern   Not on file  Social History Narrative   His mother passed at age 76 (when he was 62 years old) and his father raised him and his siblings   His parents were pastors as a lot of his sisters and other family members are today   There was a total  of 13 children in his family Had 55 sisters, one sister deceased , Had 3 brothers, all brothers deceased   He is the youngest of the 53 and was "always in trouble in my younger years"   Family still live in [place], [place], some in [place], [place]   Has a Daughter and son with a total of 10 grandchildren (55 grand daughters local and 34 grandsons)    Values family   Married twice, present wife incarcerated   Social Determinants of Health   Financial Resource Strain: Low Risk  (10/19/2022)   Overall Financial Resource Strain (CARDIA)    Difficulty of Paying Living Expenses: Not hard at all  Food Insecurity: No Food Insecurity (01/11/2023)   Hunger Vital Sign    Worried About Running Out of Food in the Last Year: Never true    Ran Out of Food in the Last Year: Never true  Transportation Needs: No Transportation Needs (01/11/2023)   PRAPARE - Hydrologist (Medical): No    Lack of Transportation (Non-Medical): No  Physical Activity: Inactive (10/19/2022)   Exercise Vital Sign    Days of Exercise per Week: 0 days    Minutes of Exercise per Session: 0 min  Stress: No Stress Concern Present (10/19/2022)   [place]    Feeling of Stress : Not at all  Social Connections: Moderately Integrated (03/31/2022)   Social Connection and Isolation Panel [NHANES]    Frequency of Communication with Friends and Family: Three times a week    Frequency of Social Gatherings with Friends and Family: Three times a week    Attends Religious Services: 1 to 4 times per year    Active Member of Clubs or Organizations: Yes    Attends Archivist Meetings: 1 to 4 times per year    Marital Status: Never married    Hospital Course: Robert Chan is a 67 year old African-American male who was voluntarily admitted to geriatric psychiatry for worsening depression and suicidal ideation.  He has been noncompliant with his  medication and his follow-up appointments.  He had not been using substances prior to admission.  He was feeling hopeless and helpless.  His medications were restarted including Abilify which was titrated up to 15 mg/day.  Doxepin was added and titrated up to 50 mg at bedtime.  He has done well on Remeron which was added and titrated up to 30 mg at bedtime.  His mood and affect improved.  He denied any suicidal thoughts.  His blood pressure was well-controlled after his Cardizem was increased to 300 mg/day.  He was pleasant and cooperative throughout his hospitalization.  He participated in groups.  He tolerated the medications and denies any side effects.  It was  felt that he maximize hospitalization he was discharged home.  On the day of discharge he denied suicidal ideation, homicidal ideation, auditory or visual hallucinations.  Judgment and insight were good.  Physical Findings: AIMS:  , ,  ,  ,    CIWA:    COWS:     Musculoskeletal: Strength & Muscle Tone: within normal limits Gait & Station: normal Patient leans: N/A   Psychiatric Specialty Exam:  Presentation  General Appearance:  Appropriate for Environment  Eye Contact: Good  Speech: Clear and Coherent  Speech Volume: Normal  Handedness: Right   Mood and Affect  Mood: Depressed  Affect: Congruent; Flat   Thought Process  Thought Processes: Coherent  Descriptions of Associations:Intact  Orientation:Full (Time, Place and Person)  Thought Content:Logical  History of Schizophrenia/Schizoaffective disorder:No  Duration of Psychotic Symptoms:N/A  Hallucinations:No data recorded Ideas of Reference:None  Suicidal Thoughts:No data recorded Homicidal Thoughts:No data recorded  Sensorium  Memory: Immediate Fair; Recent Fair  Judgment: Fair  Insight: Fair   Community education officer  Concentration: Fair  Attention Span: Fair  Recall: [employer] of  Knowledge: Fair  Language: Fair   Psychomotor Activity  Psychomotor Activity:No data recorded  Assets  Assets: Desire for Improvement; Leisure Time; Physical Health; Resilience   Sleep  Sleep:No data recorded   Physical Exam: Physical Exam Vitals and nursing note reviewed.  Constitutional:      Appearance: Normal appearance. He is normal weight.  Neurological:     General: No focal deficit present.     Mental Status: He is alert and oriented to person, place, and time.  Psychiatric:        Attention and Perception: Attention and perception normal.        Mood and Affect: Mood and affect normal.        Speech: Speech normal.        Behavior: Behavior normal. Behavior is cooperative.        Thought Content: Thought content normal.        Cognition and Memory: Cognition and memory normal.        Judgment: Judgment normal.    Review of Systems  Constitutional: Negative.   HENT: Negative.    Eyes: Negative.   Respiratory: Negative.    Cardiovascular: Negative.   Gastrointestinal: Negative.   Genitourinary: Negative.   Musculoskeletal: Negative.   Skin: Negative.   Neurological: Negative.   Endo/Heme/Allergies: Negative.   Psychiatric/Behavioral: Negative.     Blood pressure 131/78, pulse 68, temperature 98.3 F (36.8 C), resp. rate 18, height 6\' 4"  (1.93 m), weight 116.6 kg, SpO2 97 %. Body mass index is 31.29 kg/m.   Social History   Tobacco Use  Smoking Status Some Days   Types: Cigarettes   Last attempt to quit: 12/25/2020   Years since quitting: 2.0  Smokeless Tobacco Never  Tobacco Comments   Smokes a couple cigarettes every now and then   Tobacco Cessation:  A prescription for an FDA-approved tobacco cessation medication was offered at discharge and the patient refused   Blood Alcohol level:  Lab Results  Component Value Date   [facility] <10 01/10/2023   ETH <10 19/14/7829    Metabolic Disorder Labs:  Lab Results  Component Value Date   HGBA1C  5.2 01/13/2023   MPG 103 01/13/2023   MPG 91.06 09/07/2020   No results found for: "PROLACTIN" Lab Results  Component Value Date   CHOL 168 01/13/2023   TRIG 62 01/13/2023   HDL 57 01/13/2023  CHOLHDL 2.9 01/13/2023   VLDL 12 01/13/2023   LDLCALC 99 01/13/2023   LDLCALC 104 (H) 05/17/2022    See Psychiatric Specialty Exam and Suicide Risk Assessment completed by Attending Physician prior to discharge.  Discharge destination:  Home  Is patient on multiple antipsychotic therapies at discharge:  No   Has Patient had three or more failed trials of antipsychotic monotherapy by history:  No  Recommended Plan for Multiple Antipsychotic Therapies: NA   Allergies as of 01/21/2023       Reactions   Shellfish Allergy Hives, Rash   Shellfish-derived Products Rash   Other reaction(s): Other (See Comments)        Medication List     STOP taking these medications    orphenadrine 100 MG tablet Commonly known as: NORFLEX   senna-docusate 8.6-50 MG tablet Commonly known as: Senokot-S       TAKE these medications      Indication  albuterol 108 (90 Base) MCG/ACT inhaler Commonly known as: VENTOLIN HFA Inhale 1-2 puffs into the lungs every 6 (six) hours as needed for wheezing or shortness of breath.  Indication: Spasm of Lung Air Passages   ARIPiprazole 15 MG tablet Commonly known as: ABILIFY Take 1 tablet (15 mg total) by mouth daily. Start taking on: January 22, 2023 What changed:  medication strength how much to take  Indication: Schizophrenia   atorvastatin 80 MG tablet Commonly known as: LIPITOR Take 1 tablet (80 mg total) by mouth daily.  Indication: High Amount of Fats in the Blood   budesonide-formoterol 160-4.5 MCG/ACT inhaler Commonly known as: SYMBICORT Inhale 2 puffs into the lungs 2 (two) times daily.    diltiazem 300 MG 24 hr capsule Commonly known as: CARDIZEM CD Take 1 capsule (300 mg total) by mouth daily. Start taking on: January 22, 2023 What  changed:  medication strength how much to take  Indication: Atrial Fibrillation, High Blood Pressure Disorder   doxepin 50 MG capsule Commonly known as: SINEQUAN Take 1 capsule (50 mg total) by mouth at bedtime.  Indication: Depression, Insomnia   latanoprost 0.005 % ophthalmic solution Commonly known as: XALATAN Place 1 drop into both eyes at bedtime.  Indication: Wide-Angle Glaucoma   mirtazapine 30 MG tablet Commonly known as: REMERON Take 1 tablet (30 mg total) by mouth at bedtime.  Indication: Major Depressive Disorder        Follow-up Information     [facility] Follow up on 02/08/2023.   Why: You have a follow up scheduled for Tuesday April 9th at 9:30am with your provider. Please contact their office to reschedule if needed. Thanks! Contact information: [place] [address] [phone]                 Follow-up recommendations: [facility]    Signed: [place], DO 01/21/2023, 12:03 PM

## 2023-01-21 NOTE — BHH Suicide Risk Assessment (Signed)
[facility] Discharge Suicide Risk Assessment   Principal Problem: MDD (major depressive disorder), recurrent severe, without psychosis ([place]) Discharge Diagnoses: Principal Problem:   MDD (major depressive disorder), recurrent severe, without psychosis ([place])   Total Time spent with patient: 1 hour  Musculoskeletal: Strength & Muscle Tone: within normal limits Gait & Station: normal Patient leans: N/A  Psychiatric Specialty Exam  Presentation  General Appearance:  Appropriate for Environment  Eye Contact: Good  Speech: Clear and Coherent  Speech Volume: Normal  Handedness: Right   Mood and Affect  Mood: Depressed  Duration of Depression Symptoms: Greater than two weeks  Affect: Congruent; Flat   Thought Process  Thought Processes: Coherent  Descriptions of Associations:Intact  Orientation:Full (Time, Place and Person)  Thought Content:Logical  History of Schizophrenia/Schizoaffective disorder:No  Duration of Psychotic Symptoms:N/A  Hallucinations:No data recorded Ideas of Reference:None  Suicidal Thoughts:No data recorded Homicidal Thoughts:No data recorded  Sensorium  Memory: Immediate Fair; Recent Fair  Judgment: Fair  Insight: Fair   Community education officer  Concentration: Fair  Attention Span: Fair  Recall: [employer] of Knowledge: Fair  Language: Fair   Psychomotor Activity  Psychomotor Activity:No data recorded  Assets  Assets: Desire for Improvement; Leisure Time; Physical Health; Resilience   Sleep  Sleep:No data recorded  Physical Exam: Physical Exam Vitals and nursing note reviewed.  Constitutional:      Appearance: Normal appearance. He is normal weight.  Neurological:     General: No focal deficit present.     Mental Status: He is alert and oriented to person, place, and time.  Psychiatric:        Mood and Affect: Mood normal.        Behavior: Behavior normal.    Review of Systems  Constitutional:  Negative.   HENT: Negative.    Eyes: Negative.   Respiratory: Negative.    Cardiovascular: Negative.   Gastrointestinal: Negative.   Genitourinary: Negative.   Musculoskeletal: Negative.   Skin: Negative.   Neurological: Negative.   Endo/Heme/Allergies: Negative.   Psychiatric/Behavioral: Negative.     Blood pressure 131/78, pulse 68, temperature 98.3 F (36.8 C), resp. rate 18, height 6\' 4"  (1.93 m), weight 116.6 kg, SpO2 97 %. Body mass index is 31.29 kg/m.  Mental Status Per Nursing Assessment::   On Admission:  Suicidal ideation indicated by patient  Demographic Factors:  Male and Age 21 or older  Loss Factors: NA  Historical Factors: NA  Risk Reduction Factors:   NA  Continued Clinical Symptoms:  Depression:   Anhedonia Alcohol/Substance Abuse/Dependencies  Cognitive Features That Contribute To Risk:  None    Suicide Risk:  Minimal: No identifiable suicidal ideation.  Patients presenting with no risk factors but with morbid ruminations; may be classified as minimal risk based on the severity of the depressive symptoms   Follow-up Information     [facility] Follow up on 02/08/2023.   Why: You have a follow up scheduled for Tuesday April 9th at 9:30am with your provider. Please contact their office to reschedule if needed. Thanks! Contact information: [place] [address] [phone]                 Plan Of Care/Follow-up recommendations: [place], DO 01/21/2023, 11:44 AM

## 2023-01-21 NOTE — Plan of Care (Signed)
Pt denies anxiety/depression at this time. Pt denies SI/HI/AVH or pain at this time. Pt is calm and cooperative. Pt is medication compliant. Pt provided with support and encouragement. Pt monitored q15 minutes for safety per unit policy. Plan of care ongoing.   Problem: Education: Goal: Knowledge of General Education information will improve Description: Including pain rating scale, medication(s)/side effects and non-pharmacologic comfort measures Outcome: Progressing   Problem: Coping: Goal: Level of anxiety will decrease Outcome: Progressing   

## 2023-01-21 NOTE — Progress Notes (Signed)
  [facility] Adult Case Management Discharge Plan :  Will you be returning to the same living situation after discharge:  Yes,  pt will be returning back to his home At discharge, do you have transportation home?: Yes,  CSW is assisting pt with obtaining transportation home Do you have the ability to pay for your medications: Yes,  pt has [facility]  Release of information consent forms completed and in the chart;  Patient's signature needed at discharge.  Patient to Follow up at:  Follow-up Information     [facility] Follow up on 02/08/2023.   Why: You have a follow up scheduled for Tuesday April 9th at 9:30am with your provider. Please contact their office to reschedule if needed. Thanks! Contact information: [place] [address] [phone]                 Next level of care provider has access to [place] and Suicide Prevention discussed: Yes,  SPE completed with pt  Have you used any form of tobacco in the last 30 days? (Cigarettes, Smokeless Tobacco, Cigars, and/or Pipes): Yes  Has patient been referred to the Quitline?: Patient refused referral  Patient has been referred for addiction treatment: N/A  Aitanna Haubner A [place], [place] 01/21/2023, 9:30 AM

## 2023-01-21 NOTE — Progress Notes (Signed)
D: Pt alert and oriented. Pt denies experiencing any pain, SI/HI, or AVH at this time. Pt reports he will be able to keep himself safe when he return home. Pt has completed a suicide safety plan and was given a survey to fill out. Transition record was reviewed and given to pt. Pt does not wish to receive a follow up call from the manager.  A: Pt received discharge and medication education/information. Pt belongings were returned and signed for at this time.   R: Pt verbalized understanding of discharge and medication education/information.  Pt escorted by staff to medical mall front lobby where pt was picked up by safe transport.

## 2023-01-24 ENCOUNTER — Telehealth: Payer: Self-pay

## 2023-01-24 NOTE — Transitions of Care (Post Inpatient/ED Visit) (Signed)
   01/24/2023  Name: Robert Chan MRN: [id number] DOB: 07-01-1956  Today's TOC FU Call Status: Today's TOC FU Call Status:: Unsuccessul Call (1st Attempt) Unsuccessful Call (1st Attempt) Date: 01/24/23  Attempted to reach the patient regarding the most recent Inpatient/ED visit.  Follow Up Plan: Additional outreach attempts will be made to reach the patient to complete the Transitions of Care (Post Inpatient/ED visit) call.     Enzo Montgomery, RN,BSN,CCM [facility] Care Management Care Management Community Coordinator Direct Phone: [phone] Toll Free: [phone] Fax: [phone]

## 2023-01-24 NOTE — Transitions of Care (Post Inpatient/ED Visit) (Signed)
   01/24/2023  Name: Robert Chan MRN: [id number] DOB: [birth year]  Today's TOC FU Call Status: Today's TOC FU Call Status:: Unsuccessful Call (2nd Attempt) Unsuccessful Call (2nd Attempt) Date: 01/24/23  Attempted to reach the patient regarding the most recent Inpatient/ED visit.  Follow Up Plan: Additional outreach attempts will be made to reach the patient to complete the Transitions of Care (Post Inpatient/ED visit) call.     Enzo Montgomery, RN,BSN,CCM [facility] Care Management Care Management Community Coordinator Direct Phone: [phone] Toll Free: [phone] Fax: [phone]

## 2023-01-25 ENCOUNTER — Telehealth: Payer: Self-pay

## 2023-01-25 NOTE — Transitions of Care (Post Inpatient/ED Visit) (Signed)
   01/25/2023  Name: Olee Diclemente MRN: [name] DOB: [birth year]  Today's TOC FU Call Status: Today's TOC FU Call Status:: Unsuccessful Call (3rd Attempt) Unsuccessful Call (3rd Attempt) Date: 01/25/23  Attempted to reach the patient regarding the most recent Inpatient/ED visit.  Follow Up Plan: No further outreach attempts will be made at this time. We have been unable to contact the patient.    Enzo Montgomery, RN,BSN,CCM [facility] Care Management Care Management Community Coordinator Direct Phone: [phone] Toll Free: [phone] Fax: [phone]

## 2023-02-01 DIAGNOSIS — Z419 Encounter for procedure for purposes other than remedying health state, unspecified: Secondary | ICD-10-CM

## 2023-02-01 LAB — GLUCOSE, POCT (MANUAL RESULT ENTRY): POC Glucose: 106 mg/dl — AB (ref 70–99)

## 2023-02-01 NOTE — Congregational Nurse Program (Signed)
  Dept: [phone]   Congregational Nurse Program Note  Date of Encounter: 02/01/2023  Clinic visit with complaint of Gi distress with loose stools due to use of laxatives.  Had taken Ex-lax then Epsom salt when he felt the first laxative was not effective.  Recommended drinking water and carbohydrate foods until symptoms begin to resolve then begin daily stool softener as listed in his medication list.  Educated regarding the benefits of using the stool softener to prevent constipation along with eating high fiber foods such as green leafy vegetables and fruit such as apple with the peel.  BP 134/84, pulse 84, weight 265 Lbs.  Has taken BP meds as prescribed most days but not daily, reinforced need to take medication daily.  Blood glucose 106 ac dinner, reviewed A!c results with him from hospital stay. Past Medical History: Past Medical History:  Diagnosis Date   Acute bilateral low back pain without sciatica 02/27/2021   Asthma    CAD (coronary artery disease)    Cataract    CKD (chronic kidney disease)    Cocaine use disorder, mild, abuse (HCC)    COPD (chronic obstructive pulmonary disease) (HCC)    Coronary vasospasm ([place]) 10/07/2020   Depression    Elevated serum creatinine 08/28/2019   Homelessness 06/19/2020   Hypertension    NSTEMI (non-ST elevated myocardial infarction) [facility])    Status post incision and drainage 04/22/2021   Suicidal ideation 06/05/2020    Encounter Details:  CNP Questionnaire - 02/01/23 1620       Questionnaire   Ask client: Do you give verbal consent for me to treat you today? Yes    Student Assistance N/A    Location Patient [place]    Visit Setting with Client Organization    Patient Status Unknown   Has apartment at [facility]    Insurance/Financial Assistance Referral N/A    Medication N/A    Medical Provider Yes    Screening Referrals Made N/A    Medical Referrals Made N/A    Medical  Appointment Made N/A    Recently w/o PCP, now 1st time PCP visit completed due to CNs referral or appointment made N/A    Food N/A    Transportation Need transportation assistance    Housing/Utilities N/A    Interpersonal Safety N/A    Interventions Advocate/Support;Counsel;Educate;Reviewed Medications;Case Management    Abnormal to Normal Screening Since Last CN Visit N/A    Screenings CN Performed Blood Pressure;Blood Glucose;Pulse Ox    Sent Client to Lab for: N/A    Did client attend any of the following based off CNs referral or appointments made? N/A    ED Visit Averted N/A    Life-Saving Intervention Made N/A

## 2023-02-02 ENCOUNTER — Ambulatory Visit (INDEPENDENT_AMBULATORY_CARE_PROVIDER_SITE_OTHER): Payer: Medicare HMO | Admitting: Podiatry

## 2023-02-02 ENCOUNTER — Encounter: Payer: Self-pay | Admitting: Podiatry

## 2023-02-02 DIAGNOSIS — I739 Peripheral vascular disease, unspecified: Secondary | ICD-10-CM | POA: Diagnosis not present

## 2023-02-02 DIAGNOSIS — M79674 Pain in right toe(s): Secondary | ICD-10-CM

## 2023-02-02 DIAGNOSIS — B351 Tinea unguium: Secondary | ICD-10-CM

## 2023-02-02 DIAGNOSIS — M79675 Pain in left toe(s): Secondary | ICD-10-CM | POA: Diagnosis not present

## 2023-02-02 DIAGNOSIS — L84 Corns and callosities: Secondary | ICD-10-CM

## 2023-02-02 NOTE — Progress Notes (Signed)
This patient returns to my office for at risk foot care.  This patient requires this care by a professional since this patient will be at risk due to having PAD and CKD..    This patient is unable to cut nails himself since the patient cannot reach his nails.These nails are painful walking and wearing shoes. Patient also has painful callus on the outside on bottom of both feet. This patient presents for at risk foot care today.  General Appearance  Alert, conversant and in no acute stress.  Vascular  Dorsalis pedis and posterior tibial  pulses are palpable  bilaterally.  Capillary return is within normal limits  bilaterally. Temperature is within normal limits  bilaterally.  Neurologic  Senn-Weinstein monofilament wire test within normal limits  bilaterally. Muscle power within normal limits bilaterally.  Nails Thick disfigured discolored nails with subungual debris  from hallux to fifth toes bilaterally. No evidence of bacterial infection or drainage bilaterally.  Orthopedic  No limitations of motion  feet .  No crepitus or effusions noted.  No bony pathology or digital deformities noted. Plantar flexed fifth metatarsals  B/L.  Skin  normotropic skin with no porokeratosis noted bilaterally.  No signs of infections or ulcers noted.   Callus sub 5th met both feet.  Onychomycosis  Pain in right toes  Pain in left toes  Callus  B/L.  Consent was obtained for treatment procedures.   Mechanical debridement of nails 1-5  bilaterally performed with a nail nipper.  Filed with dremel without incident. Debride callus with # 15 blade.   Return office visit   10 weeks                  Told patient to return for periodic foot care and evaluation due to potential at risk complications.   Gardiner Barefoot DPM

## 2023-02-15 DIAGNOSIS — Z419 Encounter for procedure for purposes other than remedying health state, unspecified: Secondary | ICD-10-CM

## 2023-02-15 LAB — GLUCOSE, POCT (MANUAL RESULT ENTRY): POC Glucose: 92 mg/dl (ref 70–99)

## 2023-02-15 NOTE — Congregational Nurse Program (Signed)
  Dept: [phone]   Congregational Nurse Program Note  Date of Encounter: 02/15/2023  Clinic visit to check blood pressure and blood glucose, BP 132/77, pulse 67, O2 Sat 96%.  States he is taking his blood pressure medication but is not taking the Abilify or the other medications prescribed for depression or sleeping.  Denies depression, anxiety, difficulty sleeping or any thought of suicidal thoughts or harm to self or others.  Requested blood glucose check due to family history and weight of 260 Lbs, possibly prediabetes. AC lunch blood glucose 92, educated regarding normal glucose levels and benefit small amount of weight loss to prevent chronic conditions.  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 Yes    Student Assistance N/A    Location Patient [place]    Visit Setting with Client Organization    Patient Status Unknown   Has apartment at [facility]    Insurance/Financial Assistance Referral N/A    Medication N/A    Medical Provider Yes    Screening Referrals Made N/A    Medical Referrals Made N/A    Medical Appointment Made N/A    Recently w/o PCP, now 1st time PCP visit completed due to CNs referral or appointment made N/A    Food N/A    Transportation Need transportation  assistance    Housing/Utilities N/A    Interpersonal Safety N/A    Interventions Advocate/Support;Counsel;Educate;Reviewed Medications;Case Management    Abnormal to Normal Screening Since Last CN Visit N/A    Screenings CN Performed Blood Pressure;Blood Glucose;Pulse Ox    Sent Client to Lab for: N/A    Did client attend any of the following based off CNs referral or appointments made? N/A    ED Visit Averted N/A    Life-Saving Intervention Made N/A

## 2023-02-28 DIAGNOSIS — F1020 Alcohol dependence, uncomplicated: Secondary | ICD-10-CM | POA: Diagnosis not present

## 2023-02-28 DIAGNOSIS — F331 Major depressive disorder, recurrent, moderate: Secondary | ICD-10-CM | POA: Diagnosis not present

## 2023-03-01 ENCOUNTER — Telehealth: Payer: Self-pay

## 2023-03-01 NOTE — Telephone Encounter (Signed)
received a email from [email or web address] and a prior auth is needed for the doxepin 50mg .

## 2023-03-01 NOTE — Telephone Encounter (Signed)
went online to [email or web address] and submitted the prior auth for the doxepin hcl 50mg  and it was approved until 11-01-23

## 2023-03-22 DIAGNOSIS — Z419 Encounter for procedure for purposes other than remedying health state, unspecified: Secondary | ICD-10-CM

## 2023-03-22 LAB — GLUCOSE, POCT (MANUAL RESULT ENTRY): POC Glucose: 140 mg/dl — AB (ref 70–99)

## 2023-03-22 NOTE — Congregational Nurse Program (Signed)
  Dept: [phone]   Congregational Nurse Program Note  Date of Encounter: 03/22/2023  Clinic visit for weight and to check blood glucose.  BP 142/89, pulse 65, states he is taking blood pressure medicine as prescribed.  Weight 251 Lbs, blood glucose 140 approximately 1.5 hours after meal.  Ate dinner early to attend a class at the apartment complex office.  Recommended that have blood pressure checked again and notify MD if still above 140/80. Past Medical History: 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   Depression    Elevated serum creatinine 08/28/2019   Homelessness 06/19/2020   Hypertension    NSTEMI (non-ST elevated myocardial infarction) [facility])    Status post incision and drainage 04/22/2021   Suicidal ideation 06/05/2020    Encounter Details:  CNP Questionnaire - 03/22/23 1730       Questionnaire   Ask client: Do you give verbal consent for me to treat you today? Yes    Student Assistance N/A    Location Patient [place]    Visit Setting with Client Organization    Patient Status Unknown   Has apartment at [facility]    Insurance/Financial Assistance Referral N/A    Medication N/A    Medical Provider Yes    Screening Referrals Made N/A    Medical Referrals Made [facility] PCP/Clinic    Medical Appointment Made N/A    Recently w/o PCP, now 1st time PCP visit completed due to CNs referral or appointment made N/A    Food N/A    Transportation Need transportation assistance    Housing/Utilities N/A    Interpersonal Safety N/A    Interventions Advocate/Support;Counsel;Educate;Reviewed Medications;Case Management    Abnormal to Normal Screening Since  Last CN Visit N/A    Screenings CN Performed Blood Pressure;Blood Glucose;Pulse Ox    Sent Client to Lab for: N/A    Did client attend any of the following based off CNs referral or appointments made? N/A    ED Visit Averted N/A    Life-Saving Intervention Made N/A

## 2023-04-05 ENCOUNTER — Emergency Department (HOSPITAL_COMMUNITY): Payer: Medicare HMO

## 2023-04-05 ENCOUNTER — Other Ambulatory Visit: Payer: Self-pay

## 2023-04-05 ENCOUNTER — Emergency Department (HOSPITAL_COMMUNITY)
Admission: EM | Admit: 2023-04-05 | Discharge: 2023-04-06 | Payer: Medicare HMO | Attending: Emergency Medicine | Admitting: Emergency Medicine

## 2023-04-05 DIAGNOSIS — R45851 Suicidal ideations: Secondary | ICD-10-CM | POA: Diagnosis not present

## 2023-04-05 DIAGNOSIS — R079 Chest pain, unspecified: Secondary | ICD-10-CM | POA: Diagnosis not present

## 2023-04-05 DIAGNOSIS — Z7951 Long term (current) use of inhaled steroids: Secondary | ICD-10-CM | POA: Diagnosis not present

## 2023-04-05 DIAGNOSIS — J449 Chronic obstructive pulmonary disease, unspecified: Secondary | ICD-10-CM | POA: Diagnosis not present

## 2023-04-05 DIAGNOSIS — R0789 Other chest pain: Secondary | ICD-10-CM | POA: Diagnosis not present

## 2023-04-05 DIAGNOSIS — Z1152 Encounter for screening for COVID-19: Secondary | ICD-10-CM | POA: Diagnosis not present

## 2023-04-05 LAB — COMPREHENSIVE METABOLIC PANEL
ALT: 14 U/L (ref 0–44)
AST: 22 U/L (ref 15–41)
Albumin: 3.5 g/dL (ref 3.5–5.0)
Alkaline Phosphatase: 83 U/L (ref 38–126)
Anion gap: 9 (ref 5–15)
BUN: 15 mg/dL (ref 8–23)
CO2: 25 mmol/L (ref 22–32)
Calcium: 9 mg/dL (ref 8.9–10.3)
Chloride: 106 mmol/L (ref 98–111)
Creatinine, Ser: 1.59 mg/dL — ABNORMAL HIGH (ref 0.61–1.24)
GFR, Estimated: 47 mL/min — ABNORMAL LOW (ref >60)
Glucose, Bld: 104 mg/dL — ABNORMAL HIGH (ref 70–99)
Potassium: 4.1 mmol/L (ref 3.5–5.1)
Sodium: 140 mmol/L (ref 135–145)
Total Bilirubin: 0.6 mg/dL (ref 0.3–1.2)
Total Protein: 7.2 g/dL (ref 6.5–8.1)

## 2023-04-05 LAB — CBC WITH DIFFERENTIAL/PLATELET
Abs Immature Granulocytes: 0.03 10*3/uL (ref 0.00–0.07)
Basophils Absolute: 0.1 10*3/uL (ref 0.0–0.1)
Basophils Relative: 1 %
Eosinophils Absolute: 0.3 10*3/uL (ref 0.0–0.5)
Eosinophils Relative: 3 %
HCT: 42.6 % (ref 39.0–52.0)
Hemoglobin: 13.3 g/dL (ref 13.0–17.0)
Immature Granulocytes: 0 %
Lymphocytes Relative: 26 %
Lymphs Abs: 2.9 10*3/uL (ref 0.7–4.0)
MCH: 30.3 pg (ref 26.0–34.0)
MCHC: 31.2 g/dL (ref 30.0–36.0)
MCV: 97 fL (ref 80.0–100.0)
Monocytes Absolute: 1 10*3/uL (ref 0.1–1.0)
Monocytes Relative: 9 %
Neutro Abs: 6.9 10*3/uL (ref 1.7–7.7)
Neutrophils Relative %: 61 %
Platelets: 276 10*3/uL (ref 150–400)
RBC: 4.39 MIL/uL (ref 4.22–5.81)
RDW: 13 % (ref 11.5–15.5)
WBC: 11.2 10*3/uL — ABNORMAL HIGH (ref 4.0–10.5)
nRBC: 0 % (ref 0.0–0.2)

## 2023-04-05 LAB — ACETAMINOPHEN LEVEL: Acetaminophen (Tylenol), Serum: <10 ug/mL — ABNORMAL LOW (ref 10–30)

## 2023-04-05 LAB — SALICYLATE LEVEL: Salicylate Lvl: <7 mg/dL — ABNORMAL LOW (ref 7.0–30.0)

## 2023-04-05 LAB — TROPONIN I (HIGH SENSITIVITY)
Troponin I (High Sensitivity): 12 ng/L (ref <18)
Troponin I (High Sensitivity): 12 ng/L (ref <18)

## 2023-04-05 LAB — ETHANOL: Alcohol, Ethyl (B): <10 mg/dL (ref <10)

## 2023-04-05 LAB — SARS CORONAVIRUS 2 BY RT PCR: SARS Coronavirus 2 by RT PCR: NEGATIVE

## 2023-04-05 MED ORDER — ALBUTEROL SULFATE (2.5 MG/3ML) 0.083% IN NEBU: 2.5000 mg | INHALATION_SOLUTION | Freq: Four times a day (QID) | RESPIRATORY_TRACT | Status: DC | PRN

## 2023-04-05 MED ORDER — ALBUTEROL SULFATE HFA 108 (90 BASE) MCG/ACT IN AERS: 1.0000 | INHALATION_SPRAY | Freq: Four times a day (QID) | RESPIRATORY_TRACT | Status: DC | PRN

## 2023-04-05 MED ORDER — DILTIAZEM HCL ER COATED BEADS 300 MG PO CP24: 300.0000 mg | ORAL_CAPSULE | Freq: Every day | ORAL | Status: DC

## 2023-04-05 MED ORDER — ATORVASTATIN CALCIUM 80 MG PO TABS
80.0000 mg | ORAL_TABLET | Freq: Every day | ORAL | Status: DC
  Administered 2023-04-05: 80 mg via ORAL
  Filled 2023-04-05: qty 2

## 2023-04-05 MED ORDER — MOMETASONE FURO-FORMOTEROL FUM 200-5 MCG/ACT IN AERO
2.0000 | INHALATION_SPRAY | Freq: Two times a day (BID) | RESPIRATORY_TRACT | Status: DC
  Filled 2023-04-05: qty 8.8
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
## 2023-04-05 NOTE — Progress Notes (Signed)
Mancel Bale, NP recommends gero-psychiatric inpatient admission. CSW request Night [facility] AC to review.   Maryjean Ka, MSW, [facility] 04/05/2023 10:43 PM

## 2023-04-05 NOTE — ED Notes (Signed)
Patient belongings placed in locker 1 in purple zone
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
## 2023-04-06 ENCOUNTER — Encounter: Payer: Self-pay | Admitting: Nurse Practitioner

## 2023-04-06 ENCOUNTER — Inpatient Hospital Stay
Admission: AD | Admit: 2023-04-06 | Discharge: 2023-04-19 | DRG: 885 | Disposition: A | Discharge status: Home / Self Care | Payer: Medicare HMO | Source: Intra-hospital | Attending: Psychiatry | Admitting: Psychiatry

## 2023-04-06 DIAGNOSIS — Z91148 Patient's other noncompliance with medication regimen for other reason: Secondary | ICD-10-CM | POA: Diagnosis not present

## 2023-04-06 DIAGNOSIS — N189 Chronic kidney disease, unspecified: Secondary | ICD-10-CM | POA: Diagnosis not present

## 2023-04-06 DIAGNOSIS — I129 Hypertensive chronic kidney disease with stage 1 through stage 4 chronic kidney disease, or unspecified chronic kidney disease: Secondary | ICD-10-CM | POA: Diagnosis present

## 2023-04-06 DIAGNOSIS — I2510 Atherosclerotic heart disease of native coronary artery without angina pectoris: Secondary | ICD-10-CM | POA: Diagnosis present

## 2023-04-06 DIAGNOSIS — J4489 Other specified chronic obstructive pulmonary disease: Secondary | ICD-10-CM | POA: Diagnosis present

## 2023-04-06 DIAGNOSIS — R45851 Suicidal ideations: Secondary | ICD-10-CM | POA: Diagnosis present

## 2023-04-06 DIAGNOSIS — F1490 Cocaine use, unspecified, uncomplicated: Secondary | ICD-10-CM | POA: Diagnosis not present

## 2023-04-06 DIAGNOSIS — Z79899 Other long term (current) drug therapy: Secondary | ICD-10-CM

## 2023-04-06 DIAGNOSIS — I252 Old myocardial infarction: Secondary | ICD-10-CM

## 2023-04-06 DIAGNOSIS — Z8249 Family history of ischemic heart disease and other diseases of the circulatory system: Secondary | ICD-10-CM

## 2023-04-06 DIAGNOSIS — Z833 Family history of diabetes mellitus: Secondary | ICD-10-CM

## 2023-04-06 DIAGNOSIS — F332 Major depressive disorder, recurrent severe without psychotic features: Principal | ICD-10-CM | POA: Diagnosis present

## 2023-04-06 DIAGNOSIS — Z7951 Long term (current) use of inhaled steroids: Secondary | ICD-10-CM

## 2023-04-06 DIAGNOSIS — F419 Anxiety disorder, unspecified: Secondary | ICD-10-CM | POA: Diagnosis present

## 2023-04-06 DIAGNOSIS — F17210 Nicotine dependence, cigarettes, uncomplicated: Secondary | ICD-10-CM | POA: Diagnosis not present

## 2023-04-06 DIAGNOSIS — Z62810 Personal history of physical and sexual abuse in childhood: Secondary | ICD-10-CM | POA: Diagnosis not present

## 2023-04-06 LAB — RAPID URINE DRUG SCREEN, HOSP PERFORMED
Amphetamines: NOT DETECTED
Barbiturates: NOT DETECTED
Benzodiazepines: NOT DETECTED
Cocaine: POSITIVE — AB
Opiates: NOT DETECTED
Tetrahydrocannabinol: NOT DETECTED

## 2023-04-06 MED ORDER — ACETAMINOPHEN 325 MG PO TABS
650.0000 mg | ORAL_TABLET | Freq: Four times a day (QID) | ORAL | Status: DC | PRN
  Administered 2023-04-18 (×2): 650 mg via ORAL
  Filled 2023-04-06 (×2): qty 2

## 2023-04-06 MED ORDER — TRAZODONE HCL 50 MG PO TABS
50.0000 mg | ORAL_TABLET | Freq: Every evening | ORAL | Status: DC | PRN
  Administered 2023-04-06 – 2023-04-15 (×6): 50 mg via ORAL
  Filled 2023-04-06 (×6): qty 1

## 2023-04-06 MED ORDER — LORAZEPAM 1 MG PO TABS: 1.0000 mg | ORAL_TABLET | ORAL | Status: DC | PRN

## 2023-04-06 MED ORDER — LORAZEPAM 1 MG PO TABS: 2.0000 mg | ORAL_TABLET | Freq: Three times a day (TID) | ORAL | Status: DC | PRN

## 2023-04-06 MED ORDER — ALUM & MAG HYDROXIDE-SIMETH 200-200-20 MG/5ML PO SUSP: 30.0000 mL | ORAL | Status: DC | PRN

## 2023-04-06 MED ORDER — DOXEPIN HCL 50 MG PO CAPS
50.0000 mg | ORAL_CAPSULE | Freq: Every day | ORAL | Status: DC
  Administered 2023-04-06 – 2023-04-15 (×10): 50 mg via ORAL
  Filled 2023-04-06 (×10): qty 1

## 2023-04-06 MED ORDER — MAGNESIUM HYDROXIDE 400 MG/5ML PO SUSP: 30.0000 mL | Freq: Every day | ORAL | Status: DC | PRN

## 2023-04-06 MED ORDER — OLANZAPINE 5 MG PO TABS: 10.0000 mg | ORAL_TABLET | Freq: Four times a day (QID) | ORAL | Status: DC | PRN

## 2023-04-06 MED ORDER — LORAZEPAM 2 MG/ML IJ SOLN: 2.0000 mg | Freq: Three times a day (TID) | INTRAMUSCULAR | Status: DC | PRN

## 2023-04-06 MED ORDER — ATORVASTATIN CALCIUM 80 MG PO TABS
80.0000 mg | ORAL_TABLET | Freq: Every day | ORAL | Status: DC
  Administered 2023-04-06 – 2023-04-19 (×14): 80 mg via ORAL
  Filled 2023-04-06 (×14): qty 1

## 2023-04-06 MED ORDER — ARIPIPRAZOLE 5 MG PO TABS
10.0000 mg | ORAL_TABLET | Freq: Every day | ORAL | Status: DC
  Administered 2023-04-06 – 2023-04-19 (×14): 10 mg via ORAL
  Filled 2023-04-06 (×14): qty 2

## 2023-04-06 MED ORDER — DILTIAZEM HCL ER COATED BEADS 300 MG PO CP24
300.0000 mg | ORAL_CAPSULE | Freq: Every day | ORAL | Status: DC
  Administered 2023-04-06 – 2023-04-15 (×10): 300 mg via ORAL
  Filled 2023-04-06 (×11): qty 1

## 2023-04-06 MED ORDER — HYDROXYZINE HCL 25 MG PO TABS: 25.0000 mg | ORAL_TABLET | Freq: Three times a day (TID) | ORAL | Status: DC | PRN

## 2023-04-06 MED ORDER — MIRTAZAPINE 15 MG PO TABS
30.0000 mg | ORAL_TABLET | Freq: Every day | ORAL | Status: DC
  Administered 2023-04-06 – 2023-04-11 (×6): 30 mg via ORAL
  Filled 2023-04-06 (×7): qty 2

## 2023-04-06 NOTE — Group Note (Signed)
Date:  04/06/2023 Time:  10:21 PM  Group Topic/Focus:  Coping With Mental Health Crisis:   The purpose of this group is to help patients identify strategies for coping with mental health crisis.  Group discusses possible causes of crisis and ways to manage them effectively.    Participation Level:  Minimal  Participation Quality:  Attentive  Affect:  Appropriate  Cognitive:  Alert  Insight: Lacking  Engagement in Group:  Engaged  Modes of Intervention:  Discussion  Additional Comments:    Garry Heater 04/06/2023, 10:21 PM

## 2023-04-06 NOTE — Progress Notes (Signed)
Pt a 67 year old male with psychiatric history of MDD (major depressive disorder), recurrent severe, without psychosis (HCC), cocaine uses and SI. Patient endorses suicidal ideation with a plan to use a gun. Patient reports "I've has been feeling this way off and on for the past month or so".  Patient denies HI, denies AVH.  Patient reports history of depression and endorsed relapse on track cocaine as his main triggers. Pt calm and cooperative during admission assessment. Patient is alert and oriented x 4. Denies SI/HI/AVH at this time. Pt denies intent or plan to harm self or others. Routine 15 minutes checks conducted according to facility protocol. Nurse encourage patient to notify staff with any needs or concerns. Patient verbalized agreement and understanding. Will continue to monitor for safety.

## 2023-04-06 NOTE — Progress Notes (Addendum)
Pt was accepted to [facility] TODAY      04/06/2023; Bed Assignment L33 PENDING signed vol consent faxed to [phone] and notify [facility].  DX:mdd recurrent severe; suicide attempt  Pt meets inpatient criteria per Beverly Milch V,NP  Attending Physician will be Dr. Elane Fritz, DO  Report can be called to: [phone]  Pt can arrive after: [facility] and [facility] Charge RN to coordinate arrival time with care team.  Per [facility],Please pre admit this patient.  Care Team notified:Night [facility] Fransico Michael, RN, CSW Disposition [address], [address], [place], [address], [place] V,NP, [place], RN, [place], RN, Foster [place], RN, [address]   [place], LCSWA 04/06/2023 @ 12:37 AM
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
## 2023-04-06 NOTE — Plan of Care (Signed)
  Problem: Elimination: Goal: Will not experience complications related to bowel motility Outcome: Progressing Goal: Will not experience complications related to urinary retention Outcome: Progressing   Problem: Pain Managment: Goal: General experience of comfort will improve Outcome: Progressing   Problem: Safety: Goal: Ability to remain free from injury will improve Outcome: Progressing   

## 2023-04-06 NOTE — Tx Team (Signed)
Initial Treatment Plan 04/06/2023 4:39 AM Robert Chan [id number]    PATIENT STRESSORS: Medication change or noncompliance   Substance abuse   Other: depression     PATIENT STRENGTHS: Ability for insight  Capable of independent living  Physical Health    PATIENT IDENTIFIED PROBLEMS: Depression                     DISCHARGE CRITERIA:  Ability to meet basic life and health needs Adequate post-discharge living arrangements Improved stabilization in mood, thinking, and/or behavior Medical problems require only outpatient monitoring Motivation to continue treatment in a less acute level of care Need for constant or close observation no longer present Reduction of life-threatening or endangering symptoms to within safe limits Verbal commitment to aftercare and medication compliance  PRELIMINARY DISCHARGE PLAN: Return to previous living arrangement  PATIENT/FAMILY INVOLVEMENT: This treatment plan has been presented to and reviewed with the patient, Robert Chan.  The patient and family has been given the opportunity to ask questions and make suggestions.  Lahoma Crocker, RN 04/06/2023, 4:39 AM

## 2023-04-06 NOTE — BH IP Treatment Plan (Signed)
Interdisciplinary Treatment and Diagnostic Plan Update  04/06/2023 Time of Session: 10:25 AM Leiden Purple MRN: [id number]  Principal Diagnosis: MDD (major depressive disorder), recurrent severe, without psychosis (HCC)  Secondary Diagnoses: Principal Problem:   MDD (major depressive disorder), recurrent severe, without psychosis (HCC)   Current Medications:  Current Facility-Administered Medications  Medication Dose Route Frequency Provider Last Rate Last Admin   acetaminophen (TYLENOL) tablet 650 mg  650 mg Oral Q6H PRN [name], [name], NP       alum & mag hydroxide-simeth (MAALOX/MYLANTA) 200-200-20 MG/5ML suspension 30 mL  30 mL Oral Q4H PRN [name], [name], NP       hydrOXYzine (ATARAX) tablet 25 mg  25 mg Oral TID PRN [name], [name], NP       LORazepam (ATIVAN) tablet 2 mg  2 mg Oral TID PRN [name], [name], NP       Or   LORazepam (ATIVAN) injection 2 mg  2 mg Intramuscular TID PRN [name], [name], NP       magnesium hydroxide (MILK OF MAGNESIA) suspension 30 mL  30 mL Oral Daily PRN [name], [name], NP       traZODone (DESYREL) tablet 50 mg  50 mg Oral QHS PRN [name], [name], NP       PTA Medications: Medications Prior to Admission  Medication Sig Dispense Refill Last Dose   albuterol (VENTOLIN HFA) 108 (90 Base) MCG/ACT inhaler Inhale 1-2 puffs into the lungs every 6 (six) hours as needed for wheezing or shortness of breath. 90 g 2    ARIPiprazole (ABILIFY) 15 MG tablet Take 1 tablet (15 mg total) by mouth daily. 30 tablet 3    atorvastatin (LIPITOR) 80 MG tablet Take 1 tablet (80 mg total) by mouth daily. 30 tablet 1    budesonide-formoterol (SYMBICORT) 160-4.5 MCG/ACT inhaler Inhale 2 puffs into the lungs 2 (two) times daily. 1 each 3    diltiazem (CARDIZEM CD) 300 MG 24 hr capsule Take 1 capsule (300 mg total) by mouth daily. 30 capsule 3    doxepin (SINEQUAN) 50 MG capsule Take 1 capsule (50 mg total) by mouth at bedtime. 30 capsule 1     latanoprost (XALATAN) 0.005 % ophthalmic solution Place 1 drop into both eyes at bedtime. 2.5 mL 1    mirtazapine (REMERON) 30 MG tablet Take 1 tablet (30 mg total) by mouth at bedtime. 30 tablet 3     Patient Stressors: Medication change or noncompliance   Substance abuse   Other: depression    Patient Strengths: Ability for insight  Capable of independent living  Physical Health   Treatment Modalities: Medication Management, Group therapy, Case management,  1 to 1 session with clinician, Psychoeducation, Recreational therapy.   Physician Treatment Plan for Primary Diagnosis: MDD (major depressive disorder), recurrent severe, without psychosis (HCC) Long Term Goal(s):     Short Term Goals:    Medication Management: Evaluate patient's response, side effects, and tolerance of medication regimen.  Therapeutic Interventions: 1 to 1 sessions, Unit Group sessions and Medication administration.  Evaluation of Outcomes: Not Met  Physician Treatment Plan for Secondary Diagnosis: Principal Problem:   MDD (major depressive disorder), recurrent severe, without psychosis (HCC)  Long Term Goal(s):     Short Term Goals:       Medication Management: Evaluate patient's response, side effects, and tolerance of medication regimen.  Therapeutic Interventions: 1 to 1 sessions, Unit Group sessions and Medication administration.  Evaluation of Outcomes: Not Met   RN  Treatment Plan for Primary Diagnosis: MDD (major depressive disorder), recurrent severe, without psychosis (HCC) Long Term Goal(s): Knowledge of disease and therapeutic regimen to maintain health will improve  Short Term Goals: Ability to demonstrate self-control, Ability to participate in decision making will improve, Ability to verbalize feelings will improve, Ability to disclose and discuss suicidal ideas, Ability to identify and develop effective coping behaviors will improve, and Compliance with prescribed medications will  improve  Medication Management: RN will administer medications as ordered by provider, will assess and evaluate patient's response and provide education to patient for prescribed medication. RN will report any adverse and/or side effects to prescribing provider.  Therapeutic Interventions: 1 on 1 counseling sessions, Psychoeducation, Medication administration, Evaluate responses to treatment, Monitor vital signs and CBGs as ordered, Perform/monitor CIWA, COWS, AIMS and Fall Risk screenings as ordered, Perform wound care treatments as ordered.  Evaluation of Outcomes: Not Met   LCSW Treatment Plan for Primary Diagnosis: MDD (major depressive disorder), recurrent severe, without psychosis (HCC) Long Term Goal(s): Safe transition to appropriate next level of care at discharge, Engage patient in therapeutic group addressing interpersonal concerns.  Short Term Goals: Engage patient in aftercare planning with referrals and resources, Increase social support, Increase ability to appropriately verbalize feelings, Increase emotional regulation, Facilitate acceptance of mental health diagnosis and concerns, Facilitate patient progression through stages of change regarding substance use diagnoses and concerns, Identify triggers associated with mental health/substance abuse issues, and Increase skills for wellness and recovery  Therapeutic Interventions: Assess for all discharge needs, 1 to 1 time with Social worker, Explore available resources and support systems, Assess for adequacy in community support network, Educate family and significant other(s) on suicide prevention, Complete Psychosocial Assessment, Interpersonal group therapy.  Evaluation of Outcomes: Not Met   Progress in Treatment: Attending groups: Yes. Participating in groups: Yes. Taking medication as prescribed: Yes. Toleration medication: Yes. Family/Significant other contact made: No, will contact:  once permission is given.  Patient  understands diagnosis: Yes. Discussing patient identified problems/goals with staff: Yes. Medical problems stabilized or resolved: Yes. Denies suicidal/homicidal ideation: Yes. Issues/concerns per patient self-inventory: No. Other: none  New problem(s) identified: No, Describe:  none  New Short Term/Long Term Goal(s): detox, medication management for mood stabilization; elimination of SI thoughts; development of comprehensive mental wellness/sobriety plan.   Patient Goals:  "get better"  Discharge Plan or Barriers: CSW to assist in development of appropriate discharge plans.   Reason for Continuation of Hospitalization: Anxiety Depression Medication stabilization Suicidal ideation  Estimated Length of Stay:  1-7 days  Last 3 [place] Suicide Severity Risk Score: Flowsheet Row Admission (Current) from 04/06/2023 in [facility] BE[facility] ED from 04[facility]05/2023 in [facility] Emergency Department at [facility] Admission (Discharged) from 01/10/2023 in [facility] BEHAVIORAL MEDICINE  C-SSRS RISK CATEGORY High Risk High Risk High Risk       Last PHQ 2/9 Scores:    10/19/2022    2:53 PM 05/11/2022    2:30 PM 03/31/2022    2:15 PM  Depression screen PHQ 2/9  Decreased Interest 0 1 0  Down, Depressed, Hopeless 0 1 0  PHQ - 2 Score 0 2 0  Altered sleeping  0   Tired, decreased energy  1   Change in appetite  1   Trouble concentrating  1   Moving slowly or fidgety/restless  0   Suicidal thoughts  0   PHQ-9 Score  5     Scribe for Treatment Team: Harden Mo, LCSW  04/06/2023 11:15 AM

## 2023-04-06 NOTE — Progress Notes (Signed)
Patient safe on unit, currently in dayroom participating in group. Denies SI, HI and AVH. Will continue to monitor and update as needed  04/06/23 2000  Psych Admission Type (Psych Patients Only)  Admission Status Voluntary  Psychosocial Assessment  Patient Complaints None  Eye Contact Fair  Facial Expression Animated  Affect Appropriate to circumstance  Speech Logical/coherent  Interaction Assertive  Motor Activity Other (Comment) (WDL)  Appearance/Hygiene Unremarkable  Behavior Characteristics Cooperative;Appropriate to situation;Calm  Mood Pleasant  Thought Process  Coherency WDL  Content WDL  Delusions None reported or observed  Perception WDL  Hallucination None reported or observed  Judgment WDL  Confusion None  Danger to Self  Current suicidal ideation? Denies  Agreement Not to Harm Self Yes  Description of Agreement verbal  Danger to Others  Danger to Others None reported or observed

## 2023-04-06 NOTE — Group Note (Signed)
[facility] LCSW Group Therapy Note   Group Date: 04/06/2023 Start Time: 1315 End Time: 1400   Type of Therapy/Topic:  Group Therapy:  Emotion Regulation  Participation Level:  Active   Mood:  Description of Group:    The purpose of this group is to assist patients in learning to regulate negative emotions and experience positive emotions. Patients will be guided to discuss ways in which they have been vulnerable to their negative emotions. These vulnerabilities will be juxtaposed with experiences of positive emotions or situations, and patients challenged to use positive emotions to combat negative ones. Special emphasis will be placed on coping with negative emotions in conflict situations, and patients will process healthy conflict resolution skills.  Therapeutic Goals: Patient will identify two positive emotions or experiences to reflect on in order to balance out negative emotions:  Patient will label two or more emotions that they find the most difficult to experience:  Patient will be able to demonstrate positive conflict resolution skills through discussion or role plays:   Summary of Patient Progress: Patient was present in group.  Patient was engaged and supportive of others.  Patient was able to identify how he can use the skills discussed.  He was appropriate and showed fair insight.    Therapeutic Modalities:   Cognitive Behavioral Therapy Feelings Identification Dialectical Behavioral Therapy   Harden Mo, LCSW

## 2023-04-06 NOTE — ED Notes (Signed)
Report called to receiving facility [facility], report given to Foster Simpson RN. Safe transport called.
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
## 2023-04-06 NOTE — Progress Notes (Signed)
Patient is alert and oriented. He currently denies SI/HI/AVH. Affect appropriate. Mood pleasant. Patient is calm, quiet, and cooperate and communicates well with staff and other patients. Appetite good.   No PRN's given. No distress noted or voiced.   Q15 minute unit checks in place.
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
## 2023-04-06 NOTE — [facility] Suicide Risk Assessment (Signed)
[facility] INPATIENT:  Family/Significant Other Suicide Prevention Education  Suicide Prevention Education:  Patient Refusal for Family/Significant Other Suicide Prevention Education: The patient Robert Chan has refused to provide written consent for family/significant other to be provided Family/Significant Other Suicide Prevention Education during admission and/or prior to discharge.  Physician notified.  Harden Mo 04/06/2023, 11:18 AM
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
## 2023-04-06 NOTE — ED Notes (Signed)
[facility] called 

## 2023-04-06 NOTE — Group Note (Signed)
Date:  04/06/2023 Time:  6:44 PM  Group Topic/Focus:  Personal Choices and Values:   The focus of this group is to help patients assess and explore the importance of values in their lives, how their values affect their decisions, how they express their values and what opposes their expression.    Participation Level:  Active  Participation Quality:  Appropriate  Affect:  Appropriate  Cognitive:  Alert and Appropriate  Insight: Appropriate  Engagement in Group:  Engaged  Modes of Intervention:  Activity and Discussion  Additional Comments:    Doug Sou 04/06/2023, 6:44 PM

## 2023-04-07 DIAGNOSIS — F332 Major depressive disorder, recurrent severe without psychotic features: Secondary | ICD-10-CM | POA: Diagnosis not present

## 2023-04-07 NOTE — Progress Notes (Signed)
   04/07/23 2100  Psych Admission Type (Psych Patients Only)  Admission Status Voluntary  Psychosocial Assessment  Patient Complaints None  Eye Contact Fair  Facial Expression Animated  Affect Appropriate to circumstance  Speech Logical/coherent  Interaction Assertive  Motor Activity Other (Comment) (WDL)  Appearance/Hygiene Unremarkable  Behavior Characteristics Cooperative  Mood Pleasant  Thought Process  Coherency WDL  Content WDL  Delusions None reported or observed  Perception WDL  Hallucination None reported or observed  Judgment WDL  Confusion None  Danger to Self  Current suicidal ideation? Denies  Agreement Not to Harm Self Yes  Description of Agreement verbal  Danger to Others  Danger to Others None reported or observed

## 2023-04-07 NOTE — Plan of Care (Signed)
  Problem: Nutrition: Goal: Adequate nutrition will be maintained Outcome: Progressing   Problem: Safety: Goal: Ability to remain free from injury will improve Outcome: Progressing   Problem: Skin Integrity: Goal: Risk for impaired skin integrity will decrease Outcome: Progressing   

## 2023-04-07 NOTE — Progress Notes (Signed)
Patient is alert and oriented. He denies SI/HI/AVH. Denies depression and anxiety. He is adherent to medication administration. Patient prefers to isolate in his room but will appear for meals and snacks.   No issues to report.  Q15 minute unit checks in place.

## 2023-04-07 NOTE — [facility] Group Notes (Signed)
[facility] Group Notes:  (Nursing/MHT/Case Management/Adjunct)  Date:  04/07/2023  Time:  10:11 AM  Type of Therapy:  Music Therapy  Participation Level:  Did Not Attend    [name] 04/07/2023, 10:11 AM

## 2023-04-07 NOTE — Group Note (Signed)
[facility] LCSW Group Therapy Note   Group Date: 04/07/2023 Start Time: 1300 End Time: 1400   Type of Therapy/Topic:  Group Therapy:  Balance in Life  Participation Level:  Did Not Attend   Description of Group:    This group will address the concept of balance and how it feels and looks when one is unbalanced. Patients will be encouraged to process areas in their lives that are out of balance, and identify reasons for remaining unbalanced. Facilitators will guide patients utilizing problem- solving interventions to address and correct the stressor making their life unbalanced. Understanding and applying boundaries will be explored and addressed for obtaining  and maintaining a balanced life. Patients will be encouraged to explore ways to assertively make their unbalanced needs known to significant others in their lives, using other group members and facilitator for support and feedback.  Therapeutic Goals: Patient will identify two or more emotions or situations they have that consume much of in their lives. Patient will identify signs/triggers that life has become out of balance:  Patient will identify two ways to set boundaries in order to achieve balance in their lives:  Patient will demonstrate ability to communicate their needs through discussion and/or role plays  Summary of Patient Progress: Patient declined to attend group despite encouragement from this clinician.  Therapeutic Modalities:   Cognitive Behavioral Therapy Solution-Focused Therapy Assertiveness Training   Latoy Labriola J Bralon Antkowiak, LCSW 

## 2023-04-07 NOTE — Progress Notes (Signed)
[facility] MD Progress Note  04/07/2023 11:53 AM Robert Chan  MRN:  [id number] Subjective: Robert Chan is seen on rounds.  Nurses report no issues.  He states that he slept well last night.  Been compliant with medications and denies any side effects. Principal Problem: MDD (major depressive disorder), recurrent severe, without psychosis (HCC) Diagnosis: Principal Problem:   MDD (major depressive disorder), recurrent severe, without psychosis (HCC)  Total Time spent with patient: 15 minutes  Past Psychiatric History: History of depression and polysubstance abuse  Past Medical History:  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   Depression    Elevated serum creatinine 08/28/2019   Homelessness 06/19/2020   Hypertension    NSTEMI (non-ST elevated myocardial infarction) [facility])    Status post incision and drainage 04/22/2021   Suicidal ideation 06/05/2020    Past Surgical History:  Procedure Laterality Date   APPENDECTOMY     EYE SURGERY     LEFT HEART CATH AND CORONARY ANGIOGRAPHY N/A 06/05/2020   Procedure: LEFT HEART CATH AND CORONARY ANGIOGRAPHY;  Surgeon: Elder Negus, MD;  Location: [facility] INVASIVE CV LAB;  Service: Cardiovascular;  Laterality: N/A;   PROSTATE SURGERY     Family History:  Family History  Problem Relation Age of Onset   Hypertension Mother    Diabetes Mother    Hypertension Father    Hypertension Sister    Family Psychiatric  History: Unremarkable Social History:  Social History   Substance and Sexual Activity  Alcohol Use Yes   Comment: drinks 2x/wk, 2-3 40 oz drinks, last drink 2 weeks ago     Social History   Substance and Sexual Activity  Drug Use Not Currently   Types:  Cocaine   Comment: cocaine use once a month, last use 6 mos ago    Social History   Socioeconomic History   Marital status: Single    Spouse name: Not on file   Number of children: Not on file   Years of education: Not on file   Highest education level: Not on file  Occupational History   Not on file  Tobacco Use   Smoking status: Some Days    Types: Cigarettes    Last attempt to quit: 12/25/2020    Years since quitting: 2.2   Smokeless tobacco: Never   Tobacco comments:    Smokes a couple cigarettes every now and then  Vaping Use   Vaping Use: Never used  Substance and Sexual Activity   Alcohol use: Yes    Comment: drinks 2x/wk, 2-3 40 oz drinks, last drink 2 weeks ago   Drug use: Not Currently    Types: Cocaine    Comment: cocaine use once a month, last use 6 mos ago   Sexual activity: Not Currently  Other Topics Concern   Not on file  Social History Narrative   His mother passed at age 16 (when he was 17 years old) and his father raised him and his siblings   His parents were pastors as a lot of his sisters and other family members are today   There was a total of 13 children in his family Had 3 sisters, one sister  deceased , Had 3 brothers, all brothers deceased   He is the youngest of the 4 and was "always in trouble in my younger years"   Family still live in [place], [place], some in [place], [place]   Has a Daughter and son with a total of 10 grandchildren (6 grand daughters local and 4 grandsons)    Values family   Married twice, present wife incarcerated   Social Determinants of Health   Financial Resource Strain: Low Risk  (10/19/2022)   Overall Financial Resource Strain (CARDIA)    Difficulty of Paying Living Expenses: Not hard at all  Food Insecurity: No Food Insecurity (04/06/2023)   Hunger Vital Sign    Worried About Running Out of Food in the Last Year: Never true    Ran Out of Food in the Last Year: Never true  Transportation Needs: No Transportation  Needs (04/06/2023)   PRAPARE - Administrator, Civil Service (Medical): No    Lack of Transportation (Non-Medical): No  Physical Activity: Inactive (10/19/2022)   Exercise Vital Sign    Days of Exercise per Week: 0 days    Minutes of Exercise per Session: 0 min  Stress: No Stress Concern Present (10/19/2022)   [name] of Occupational Health - Occupational Stress Questionnaire    Feeling of Stress : Not at all  Social Connections: Moderately Integrated (03/31/2022)   Social Connection and Isolation Panel [NHANES]    Frequency of Communication with Friends and Family: Three times a week    Frequency of Social Gatherings with Friends and Family: Three times a week    Attends Religious Services: 1 to 4 times per year    Active Member of Clubs or Organizations: Yes    Attends Banker Meetings: 1 to 4 times per year    Marital Status: Never married   Additional Social History:                         Sleep: Good  Appetite:  Good  Current Medications: Current Facility-Administered Medications  Medication Dose Route Frequency Provider Last Rate Last Admin   acetaminophen (TYLENOL) tablet 650 mg  650 mg Oral Q6H PRN [name], [name], NP       alum & mag hydroxide-simeth (MAALOX/MYLANTA) 200-200-20 MG/5ML suspension 30 mL  30 mL Oral Q4H PRN [name], [name], NP       ARIPiprazole (ABILIFY) tablet 10 mg  10 mg Oral QPC breakfast [name], DO   10 mg at 04/07/23 0931   atorvastatin (LIPITOR) tablet 80 mg  80 mg Oral Daily [name], DO   80 mg at 04/07/23 0931   diltiazem (CARDIZEM CD) 24 hr capsule 300 mg  300 mg Oral Daily [name], DO   300 mg at 04/07/23 0931   doxepin (SINEQUAN) capsule 50 mg  50 mg Oral QHS [name], DO   50 mg at 04/06/23 2121   hydrOXYzine (ATARAX) tablet 25 mg  25 mg Oral TID PRN [name], [name], NP       LORazepam (ATIVAN) injection 2 mg  2 mg  Intramuscular TID PRN [name], [name], NP       LORazepam (ATIVAN) tablet 1 mg  1 mg Oral Q4H PRN [name], DO       magnesium hydroxide (MILK OF MAGNESIA) suspension 30 mL  30 mL Oral Daily PRN [name], [name], NP  mirtazapine (REMERON) tablet 30 mg  30 mg Oral QHS [name], DO   30 mg at 04/06/23 2121   OLANZapine (ZYPREXA) tablet 10 mg  10 mg Oral Q6H PRN [name], DO       traZODone (DESYREL) tablet 50 mg  50 mg Oral QHS PRN [name], [name], NP   50 mg at 04/06/23 2121    Lab Results:  Results for orders placed or performed during the hospital encounter of 04/05/23 (from the past 48 hour(s))  Acetaminophen level     Status: Abnormal   Collection Time: 04/05/23  6:51 PM  Result Value Ref Range   Acetaminophen (Tylenol), Serum <10 (L) 10 - 30 ug/mL    Comment: (NOTE) Therapeutic concentrations vary significantly. A range of 10-30 ug/mL  may be an effective concentration for many patients. However, some  are best treated at concentrations outside of this range. Acetaminophen concentrations >150 ug/mL at 4 hours after ingestion  and >50 ug/mL at 12 hours after ingestion are often associated with  toxic reactions.  Performed at [facility], [address], [place], [place] [address]   CBC with Differential/Platelet     Status: Abnormal   Collection Time: 04/05/23  6:51 PM  Result Value Ref Range   WBC 11.2 (H) 4.0 - 10.5 K/uL   RBC 4.39 4.22 - 5.81 MIL/uL   Hemoglobin 13.3 13.0 - 17.0 g/dL   HCT 60.4 54.0 - 98.1 %   MCV 97.0 80.0 - 100.0 fL   MCH 30.3 26.0 - 34.0 pg   MCHC 31.2 30.0 - 36.0 g/dL   RDW 19.1 47.8 - 29.5 %   Platelets 276 150 - 400 K/uL   nRBC 0.0 0.0 - 0.2 %   Neutrophils Relative % 61 %   Neutro Abs 6.9 1.7 - 7.7 K/uL   Lymphocytes Relative 26 %   Lymphs Abs 2.9 0.7 - 4.0 K/uL   Monocytes Relative 9 %   Monocytes Absolute 1.0 0.1 - 1.0 K/uL   Eosinophils Relative 3 %   Eosinophils Absolute  0.3 0.0 - 0.5 K/uL   Basophils Relative 1 %   Basophils Absolute 0.1 0.0 - 0.1 K/uL   Immature Granulocytes 0 %   Abs Immature Granulocytes 0.03 0.00 - 0.07 K/uL    Comment: Performed at [facility], [address], [place], [place] [address]  Comprehensive metabolic panel     Status: Abnormal   Collection Time: 04/05/23  6:51 PM  Result Value Ref Range   Sodium 140 135 - 145 mmol/L   Potassium 4.1 3.5 - 5.1 mmol/L   Chloride 106 98 - 111 mmol/L   CO2 25 22 - 32 mmol/L   Glucose, Bld 104 (H) 70 - 99 mg/dL    Comment: Glucose reference range applies only to samples taken after fasting for at least 8 hours.   BUN 15 8 - 23 mg/dL   Creatinine, Ser 8.65 (H) 0.61 - 1.24 mg/dL   Calcium 9.0 8.9 - 78.4 mg/dL   Total Protein 7.2 6.5 - 8.1 g/dL   Albumin 3.5 3.5 - 5.0 g/dL   AST 22 15 - 41 U/L   ALT 14 0 - 44 U/L   Alkaline Phosphatase 83 38 - 126 U/L   Total Bilirubin 0.6 0.3 - 1.2 mg/dL   GFR, Estimated 47 (L) >60 mL/min    Comment: (NOTE) Calculated using the CKD-EPI Creatinine Equation (2021)    Anion gap 9 5 -  15    Comment: Performed at [facility], [address] [place], [place] [address]  Ethanol     Status: None   Collection Time: 04/05/23  6:51 PM  Result Value Ref Range   Alcohol, Ethyl (B) <10 <10 mg/dL    Comment: (NOTE) Lowest detectable limit for serum alcohol is 10 mg/dL.  For medical purposes only. Performed at [facility], [address], [place], [place] [address]   Salicylate level     Status: Abnormal   Collection Time: 04/05/23  6:51 PM  Result Value Ref Range   Salicylate Lvl <7.0 (L) 7.0 - 30.0 mg/dL    Comment: Performed at [facility], [address], [place], [place] [address]  Troponin I (High Sensitivity)     Status: None   Collection Time: 04/05/23  6:51 PM  Result Value Ref Range   Troponin I (High Sensitivity) 12 <18 ng/L    Comment: (NOTE) Elevated high sensitivity troponin I (hsTnI) values and significant   changes across serial measurements may suggest ACS but many other  chronic and acute conditions are known to elevate hsTnI results.  Refer to the "Links" section for chest pain algorithms and additional  guidance. Performed at [facility], [address] [place], [place] [address]   Troponin I (High Sensitivity)     Status: None   Collection Time: 04/05/23  7:38 PM  Result Value Ref Range   Troponin I (High Sensitivity) 12 <18 ng/L    Comment: (NOTE) Elevated high sensitivity troponin I (hsTnI) values and significant  changes across serial measurements may suggest ACS but many other  chronic and acute conditions are known to elevate hsTnI results.  Refer to the "Links" section for chest pain algorithms and additional  guidance. Performed at [facility], [address], [place], [place] [address]   SARS Coronavirus 2 by RT PCR (hospital order, performed in [facility] hospital lab) *cepheid single result test* Anterior Nasal Swab     Status: None   Collection Time: 04/05/23  8:14 PM   Specimen: Anterior Nasal Swab  Result Value Ref Range   SARS Coronavirus 2 by RT PCR NEGATIVE NEGATIVE    Comment: Performed at [facility], [address], [place], [place] [address]  Rapid urine drug screen (hospital performed)     Status: Abnormal   Collection Time: 04/05/23 10:15 PM  Result Value Ref Range   Opiates NONE DETECTED NONE DETECTED   Cocaine POSITIVE (A) NONE DETECTED   Benzodiazepines NONE DETECTED NONE DETECTED   Amphetamines NONE DETECTED NONE DETECTED   Tetrahydrocannabinol NONE DETECTED NONE DETECTED   Barbiturates NONE DETECTED NONE DETECTED    Comment: (NOTE) DRUG SCREEN FOR MEDICAL PURPOSES ONLY.  IF CONFIRMATION IS NEEDED FOR ANY PURPOSE, NOTIFY LAB WITHIN 5 DAYS.  LOWEST DETECTABLE LIMITS FOR URINE DRUG SCREEN Drug Class                     Cutoff (ng/mL) Amphetamine and metabolites    1000 Barbiturate and metabolites    200 Benzodiazepine                  200 Opiates and metabolites        300 Cocaine and metabolites        300 THC  50 Performed at [facility], [address] [place], [place] [address]     Blood Alcohol level:  Lab Results  Component Value Date   ETH <10 04/05/2023   ETH <10 01/10/2023    Metabolic Disorder Labs: Lab Results  Component Value Date   HGBA1C 5.2 01/13/2023   MPG 103 01/13/2023   MPG 91.06 09/07/2020   No results found for: "PROLACTIN" Lab Results  Component Value Date   CHOL 168 01/13/2023   TRIG 62 01/13/2023   HDL 57 01/13/2023   CHOLHDL 2.9 01/13/2023   VLDL 12 01/13/2023   LDLCALC 99 01/13/2023   LDLCALC 104 (H) 05/17/2022    Physical Findings: AIMS:  , ,  ,  ,    CIWA:    COWS:     Musculoskeletal: Strength & Muscle Tone: within normal limits Gait & Station: normal Patient leans: N/A  Psychiatric Specialty Exam:  Presentation  General Appearance:  Appropriate for Environment  Eye Contact: Good  Speech: Clear and Coherent  Speech Volume: Normal  Handedness: Right   Mood and Affect  Mood: Depressed  Affect: Congruent; Flat   Thought Process  Thought Processes: Coherent  Descriptions of Associations:Intact  Orientation:Full (Time, Place and Person)  Thought Content:Logical  History of Schizophrenia/Schizoaffective disorder:No  Duration of Psychotic Symptoms:Less than six months  Hallucinations:No data recorded Ideas of Reference:None  Suicidal Thoughts:No data recorded Homicidal Thoughts:No data recorded  Sensorium  Memory: Immediate Fair; Recent Fair  Judgment: Fair  Insight: Fair   Art therapist  Concentration: Fair  Attention Span: Fair  Recall: [employer] of Knowledge: Fair  Language: Fair   Psychomotor Activity  Psychomotor Activity:No data recorded  Assets  Assets: Desire for Improvement; Leisure Time; Physical Health; Resilience   Sleep  Sleep:No  data recorded   Physical Exam: Physical Exam Vitals and nursing note reviewed.  Constitutional:      Appearance: Normal appearance. He is normal weight.  Neurological:     General: No focal deficit present.     Mental Status: He is alert and oriented to person, place, and time.  Psychiatric:        Attention and Perception: Attention and perception normal.        Mood and Affect: Mood is depressed. Affect is flat.        Speech: Speech normal.        Behavior: Behavior normal. Behavior is cooperative.        Thought Content: Thought content normal.        Cognition and Memory: Cognition normal.        Judgment: Judgment is inappropriate.    Review of Systems  Constitutional: Negative.   HENT: Negative.    Eyes: Negative.   Respiratory: Negative.    Cardiovascular: Negative.   Gastrointestinal: Negative.   Genitourinary: Negative.   Musculoskeletal: Negative.   Skin: Negative.   Neurological: Negative.   Endo/Heme/Allergies: Negative.   Psychiatric/Behavioral:  Positive for depression.    Blood pressure 133/81, pulse 71, temperature 97.8 F (36.6 C), resp. rate 18, height 6\' 4"  (1.93 m), weight 113.8 kg, SpO2 97 %. Body mass index is 30.54 kg/m.   Treatment Plan Summary: Daily contact with patient to assess and evaluate symptoms and progress in treatment, Medication management, and Plan continue current medications.  [name], DO 04/07/2023, 11:53 AM

## 2023-04-08 DIAGNOSIS — F332 Major depressive disorder, recurrent severe without psychotic features: Secondary | ICD-10-CM | POA: Diagnosis not present

## 2023-04-08 NOTE — Progress Notes (Signed)
Patient is A+O x 4. He denies SI/HI/AVH. Affect flat. Mood pleasant. Appetite good. Adherent to taking medications. Pain 0/10.  No new orders. No PRN's given. No distress noted or voiced.  Q15 minute unit checks in place.

## 2023-04-08 NOTE — Group Note (Signed)
Date:  04/08/2023 Time:  9:59 PM  Group Topic/Focus:  Building Self Esteem:   The Focus of this group is helping patients become aware of the effects of self-esteem on their lives, the things they and others do that enhance or undermine their self-esteem, seeing the relationship between their level of self-esteem and the choices they make and learning ways to enhance self-esteem. Coping With Mental Health Crisis:   The purpose of this group is to help patients identify strategies for coping with mental health crisis.  Group discusses possible causes of crisis and ways to manage them effectively. Developing a Wellness Toolbox:   The focus of this group is to help patients develop a "wellness toolbox" with skills and strategies to promote recovery upon discharge. Early Warning Signs:   The focus of this group is to help patients identify signs or symptoms they exhibit before slipping into an unhealthy state or crisis. Emotional Education:   The focus of this group is to discuss what feelings/emotions are, and how they are experienced. Goals Group:   The focus of this group is to help patients establish daily goals to achieve during treatment and discuss how the patient can incorporate goal setting into their daily lives to aide in recovery. Identifying Needs:   The focus of this group is to help patients identify their personal needs that have been historically problematic and identify healthy behaviors to address their needs. Making Healthy Choices:   The focus of this group is to help patients identify negative/unhealthy choices they were using prior to admission and identify positive/healthier coping strategies to replace them upon discharge. Managing Feelings:   The focus of this group is to identify what feelings patients have difficulty handling and develop a plan to handle them in a healthier way upon discharge. Personal Choices and Values:   The focus of this group is to help patients assess and  explore the importance of values in their lives, how their values affect their decisions, how they express their values and what opposes their expression.    Participation Level:  Active  Participation Quality:  Appropriate  Affect:  Appropriate  Cognitive:  Alert and Appropriate  Insight: Appropriate, Good, and Improving  Engagement in Group:  Engaged  Modes of Intervention:  Activity and Support  Additional Comments:    Robert Chan 04/08/2023, 9:59 PM

## 2023-04-08 NOTE — Group Note (Signed)
Recreation Therapy Group Note   Group Topic:Healthy Support Systems  Group Date: 04/08/2023 Start Time: 1400 End Time: 1440 Facilitators: Rosina Lowenstein, LRT, CTRS Location:  Dayroom  Group Description: Straw Bridge. In groups of 3, patients were given 10 plastic drinking straws and an equal length of masking tape. Using the materials provided, in pairs patients were instructed to build a free-standing bridge-like structure to suspend an everyday item (ex: deck of cards) off the floor or table surface. All materials were required to be used in Secondary school teacher. LRT facilitated post-activity discussion reviewing the importance of having strong and healthy support systems in our lives. LRT discussed how the people in our lives serve as the tape and the book we placed on top of our straw structure are the stressors we face in daily life. LRT and pts discussed what happens in our life when things get too heavy for [place], and we don't have strong supports outside of the hospital. Pt shared 2 of their healthy supports aloud in the group.   Goal Area(s) Addressed:  Patient will identify 2 healthy supports in their life. Patient will identify skills to successfully complete activity. Patient will identify correlation of this activity to life post-discharge.   Affect/Mood: Appropriate and Flat   Participation Level: Active and Engaged   Participation Quality: Independent   Behavior: Calm and Cooperative   Speech/Thought Process: Coherent   Insight: Fair   Judgement: Fair    Modes of Intervention: Activity   Patient Response to Interventions:  Receptive   Education Outcome:  Acknowledges education   Clinical Observations/Individualized Feedback: Elza was active in their participation of session activities and group discussion. Pt identified "my friends and working out" as his heathy supports. Pt appropriately used materials while interacting well with LRT and peers.   Plan: Continue to  engage patient in RT group sessions 2-3x/week.   Rosina Lowenstein, LRT, CTRS 04/08/2023 2:52 PM

## 2023-04-08 NOTE — Progress Notes (Signed)
[name] MD Progress Note  04/08/2023 12:09 PM Robert Chan  MRN:  [id number] Subjective: Robert Chan is seen on rounds.  He says that he slept well last night.  He states that his mood is doing better.  He has been compliant with medications and denies any side effects.  Been pleasant and cooperative on the unit.  Nurses report no issues. Principal Problem: MDD (major depressive disorder), recurrent severe, without psychosis (HCC) Diagnosis: Principal Problem:   MDD (major depressive disorder), recurrent severe, without psychosis (HCC)  Total Time spent with patient: 15 minutes  Past Psychiatric History: History of depression and polysubstance abuse.  Past Medical History:  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   Depression    Elevated serum creatinine 08/28/2019   Homelessness 06/19/2020   Hypertension    NSTEMI (non-ST elevated myocardial infarction) [facility])    Status post incision and drainage 04/22/2021   Suicidal ideation 06/05/2020    Past Surgical History:  Procedure Laterality Date   APPENDECTOMY     EYE SURGERY     LEFT HEART CATH AND CORONARY ANGIOGRAPHY N/A 06/05/2020   Procedure: LEFT HEART CATH AND CORONARY ANGIOGRAPHY;  Surgeon: Elder Negus, MD;  Location: [facility] INVASIVE CV LAB;  Service: Cardiovascular;  Laterality: N/A;   PROSTATE SURGERY     Family History:  Family History  Problem Relation Age of Onset   Hypertension Mother    Diabetes Mother    Hypertension Father    Hypertension Sister    Family Psychiatric  History: Unremarkable Social History:  Social History   Substance and Sexual Activity  Alcohol Use Yes   Comment: drinks 2x/wk, 2-3 40 oz drinks, last drink 2 weeks ago      Social History   Substance and Sexual Activity  Drug Use Not Currently   Types: Cocaine   Comment: cocaine use once a month, last use 6 mos ago    Social History   Socioeconomic History   Marital status: Single    Spouse name: Not on file   Number of children: Not on file   Years of education: Not on file   Highest education level: Not on file  Occupational History   Not on file  Tobacco Use   Smoking status: Some Days    Types: Cigarettes    Last attempt to quit: 12/25/2020    Years since quitting: 2.2   Smokeless tobacco: Never   Tobacco comments:    Smokes a couple cigarettes every now and then  Vaping Use   Vaping Use: Never used  Substance and Sexual Activity   Alcohol use: Yes    Comment: drinks 2x/wk, 2-3 40 oz drinks, last drink 2 weeks ago   Drug use: Not Currently    Types: Cocaine    Comment: cocaine use once a month, last use 6 mos ago   Sexual activity: Not Currently  Other Topics Concern   Not on file  Social History Narrative   His mother passed at age 80 (when he was 26 years old) and his father raised him and his siblings   His parents were pastors as a lot of his sisters and other family members  are today   There was a total of 13 children in his family Had 70 sisters, one sister deceased , Had 3 brothers, all brothers deceased   He is the youngest of the 15 and was "always in trouble in my younger years"   Family still live in [place], [place], some in [place], [place]   Has a Daughter and son with a total of 10 grandchildren (6 grand daughters local and 4 grandsons)    Values family   Married twice, present wife incarcerated   Social Determinants of Health   Financial Resource Strain: Low Risk  (10/19/2022)   Overall Financial Resource Strain (CARDIA)    Difficulty of Paying Living Expenses: Not hard at all  Food Insecurity: No Food Insecurity (04/06/2023)   Hunger Vital Sign    Worried About Running Out of Food in the Last Year: Never true     Ran Out of Food in the Last Year: Never true  Transportation Needs: No Transportation Needs (04/06/2023)   PRAPARE - Administrator, Civil Service (Medical): No    Lack of Transportation (Non-Medical): No  Physical Activity: Inactive (10/19/2022)   Exercise Vital Sign    Days of Exercise per Week: 0 days    Minutes of Exercise per Session: 0 min  Stress: No Stress Concern Present (10/19/2022)   [name] of Occupational Health - Occupational Stress Questionnaire    Feeling of Stress : Not at all  Social Connections: Moderately Integrated (03/31/2022)   Social Connection and Isolation Panel [NHANES]    Frequency of Communication with Friends and Family: Three times a week    Frequency of Social Gatherings with Friends and Family: Three times a week    Attends Religious Services: 1 to 4 times per year    Active Member of Clubs or Organizations: Yes    Attends Banker Meetings: 1 to 4 times per year    Marital Status: Never married   Additional Social History:                         Sleep: Good  Appetite:  Good  Current Medications: Current Facility-Administered Medications  Medication Dose Route Frequency Provider Last Rate Last Admin   acetaminophen (TYLENOL) tablet 650 mg  650 mg Oral Q6H PRN [name], [name], NP       alum & mag hydroxide-simeth (MAALOX/MYLANTA) 200-200-20 MG/5ML suspension 30 mL  30 mL Oral Q4H PRN [name], [name], NP       ARIPiprazole (ABILIFY) tablet 10 mg  10 mg Oral QPC breakfast [name], DO   10 mg at 04/08/23 0930   atorvastatin (LIPITOR) tablet 80 mg  80 mg Oral Daily [name], DO   80 mg at 04/08/23 0929   diltiazem (CARDIZEM CD) 24 hr capsule 300 mg  300 mg Oral Daily [name], DO   300 mg at 04/08/23 0930   doxepin (SINEQUAN) capsule 50 mg  50 mg Oral QHS [name], DO   50 mg at 04/07/23 2124   hydrOXYzine (ATARAX) tablet 25 mg  25 mg Oral TID  PRN [name], [name], NP       LORazepam (ATIVAN) injection 2 mg  2 mg Intramuscular TID PRN [name], [name], NP       LORazepam (ATIVAN) tablet 1 mg  1 mg Oral Q4H PRN [name], DO       magnesium hydroxide (  MILK OF MAGNESIA) suspension 30 mL  30 mL Oral Daily PRN [name], [name], NP       mirtazapine (REMERON) tablet 30 mg  30 mg Oral QHS [name], DO   30 mg at 04/07/23 2124   OLANZapine (ZYPREXA) tablet 10 mg  10 mg Oral Q6H PRN [name], DO       traZODone (DESYREL) tablet 50 mg  50 mg Oral QHS PRN [name], [name], NP   50 mg at 04/07/23 2124    Lab Results: No results found for this or any previous visit (from the past 48 hour(s)).  Blood Alcohol level:  Lab Results  Component Value Date   ETH <10 04/05/2023   ETH <10 01/10/2023    Metabolic Disorder Labs: Lab Results  Component Value Date   HGBA1C 5.2 01/13/2023   MPG 103 01/13/2023   MPG 91.06 09/07/2020   No results found for: "PROLACTIN" Lab Results  Component Value Date   CHOL 168 01/13/2023   TRIG 62 01/13/2023   HDL 57 01/13/2023   CHOLHDL 2.9 01/13/2023   VLDL 12 01/13/2023   LDLCALC 99 01/13/2023   LDLCALC 104 (H) 05/17/2022    Physical Findings: AIMS:  , ,  ,  ,    CIWA:    COWS:     Musculoskeletal: Strength & Muscle Tone: within normal limits Gait & Station: normal Patient leans: N/A  Psychiatric Specialty Exam:  Presentation  General Appearance:  Appropriate for Environment  Eye Contact: Good  Speech: Clear and Coherent  Speech Volume: Normal  Handedness: Right   Mood and Affect  Mood: Depressed  Affect: Congruent; Flat   Thought Process  Thought Processes: Coherent  Descriptions of Associations:Intact  Orientation:Full (Time, Place and Person)  Thought Content:Logical  History of Schizophrenia/Schizoaffective disorder:No  Duration of Psychotic Symptoms:Less than six months  Hallucinations:No data  recorded Ideas of Reference:None  Suicidal Thoughts:No data recorded Homicidal Thoughts:No data recorded  Sensorium  Memory: Immediate Fair; Recent Fair  Judgment: Fair  Insight: Fair   Art therapist  Concentration: Fair  Attention Span: Fair  Recall: [employer] of Knowledge: Fair  Language: Fair   Psychomotor Activity  Psychomotor Activity:No data recorded  Assets  Assets: Desire for Improvement; Leisure Time; Physical Health; Resilience   Sleep  Sleep:No data recorded   Physical Exam: Physical Exam Vitals and nursing note reviewed.  Constitutional:      Appearance: Normal appearance. He is normal weight.  Neurological:     General: No focal deficit present.     Mental Status: He is alert and oriented to person, place, and time.  Psychiatric:        Attention and Perception: Attention and perception normal.        Mood and Affect: Mood is depressed. Affect is flat.        Speech: Speech normal.        Behavior: Behavior normal. Behavior is cooperative.        Thought Content: Thought content normal.        Cognition and Memory: Cognition and memory normal.        Judgment: Judgment normal.    Review of Systems  Constitutional: Negative.   HENT: Negative.    Eyes: Negative.   Respiratory: Negative.    Cardiovascular: Negative.   Gastrointestinal: Negative.   Genitourinary: Negative.   Musculoskeletal: Negative.   Skin: Negative.   Neurological: Negative.   Endo/Heme/Allergies: Negative.   Psychiatric/Behavioral:  Positive for  depression.    Blood pressure 116/72, pulse 61, temperature (!) 97.5 F (36.4 C), resp. rate 15, height 6\' 4"  (1.93 m), weight 113.8 kg, SpO2 100 %. Body mass index is 30.54 kg/m.   Treatment Plan Summary: Daily contact with patient to assess and evaluate symptoms and progress in treatment, Medication management, and Plan continue current medications.  [name], DO 04/08/2023, 12:09 PM

## 2023-04-08 NOTE — Plan of Care (Signed)
°  Problem: Education: °Goal: Knowledge of General Education information will improve °Description: Including pain rating scale, medication(s)/side effects and non-pharmacologic comfort measures °Outcome: Progressing °  °Problem: Health Behavior/Discharge Planning: °Goal: Ability to manage health-related needs will improve °Outcome: Progressing °  °Problem: Clinical Measurements: °Goal: Cardiovascular complication will be avoided °Outcome: Progressing °  °

## 2023-04-08 NOTE — [facility] Group Notes (Signed)
[facility] Group Notes:  (Nursing/MHT/Case Management/Adjunct)  Date:  04/08/2023  Time:  10:16 AM  Type of Therapy:  Movement Therapy  Participation Level:  Did Not Attend     Robert Chan 04/08/2023, 10:16 AM

## 2023-04-08 NOTE — Progress Notes (Signed)
Patient A&Ox4. Patient denies SI/HI and AVH. Denies any physical complaints when asked. Patient was observed participating in group. No acute distress noted. Support and encouragement provided. Routine safety checks conducted according to facility protocol. Encouraged patient to notify staff if thoughts of harm toward self or others arise. Patient verbalize understanding and agreement. Will continue to monitor for safety.

## 2023-04-09 NOTE — Progress Notes (Signed)
   04/08/23 2200  Psych Admission Type (Psych Patients Only)  Admission Status Voluntary  Psychosocial Assessment  Patient Complaints None  Eye Contact Fair  Facial Expression Flat  Affect Appropriate to circumstance  Speech Logical/coherent  Interaction Assertive  Motor Activity Other (Comment) (WDL)  Appearance/Hygiene Unremarkable  Behavior Characteristics Cooperative  Mood Pleasant  Thought Process  Coherency WDL  Content WDL  Delusions None reported or observed  Perception WDL  Hallucination None reported or observed  Judgment WDL  Confusion None  Danger to Self  Current suicidal ideation? Denies  Agreement Not to Harm Self Yes  Description of Agreement Verbal  Danger to Others  Danger to Others None reported or observed

## 2023-04-09 NOTE — Group Note (Signed)
Date:  04/09/2023 Time:  8:56 PM  Group Topic/Focus:  Healthy Communication:   The focus of this group is to discuss communication, barriers to communication, as well as healthy ways to communicate with others.    Participation Level:  Active  Participation Quality:  Appropriate  Affect:  Appropriate  Cognitive:  Alert  Insight: Good  Engagement in Group:  Engaged  Modes of Intervention:  Education  Additional Comments:    Garry Heater 04/09/2023, 8:56 PM

## 2023-04-09 NOTE — Progress Notes (Signed)
   04/09/23 0900  Psych Admission Type (Psych Patients Only)  Admission Status Voluntary  Psychosocial Assessment  Patient Complaints None  Eye Contact Fair  Facial Expression Flat  Affect Flat  Speech Logical/coherent  Interaction Assertive  Appearance/Hygiene Unremarkable  Behavior Characteristics Cooperative;Calm  Mood Pleasant  Thought Process  Coherency WDL  Content WDL  Delusions None reported or observed  Perception WDL  Hallucination None reported or observed  Judgment WDL  Confusion None  Danger to Self  Current suicidal ideation? Denies  Agreement Not to Harm Self Yes  Danger to Others  Danger to Others None reported or observed   Patient appears calm and cooperative. No PRNs given. Tolerated all meals and medications today. Spent most time in the room.

## 2023-04-09 NOTE — [facility] Group Notes (Signed)
[facility] Group Notes:  (Nursing/MHT/Case Management/Adjunct)  Date:  04/09/2023  Time:  2:51 PM  Type of Therapy:  Music Therapy  Participation Level:  Active  Participation Quality:  Appropriate  Affect:  Appropriate  Cognitive:  Appropriate  Insight:  Appropriate  Engagement in Group:  Engaged  Modes of Intervention:  Activity  Summary of Progress/Problems:  Robert Chan 04/09/2023, 2:51 PM

## 2023-04-09 NOTE — Group Note (Signed)
Date:  04/09/2023 Time:  3:41 PM  Group Topic/Focus:  Overcoming Stress:   The focus of this group is to define stress and help patients assess their triggers.    Participation Level:  Active  Participation Quality:  Appropriate  Affect:  Depressed  Cognitive:  Alert, Appropriate, and Oriented  Insight: Good  Engagement in Group:  Engaged  Modes of Intervention:  Activity and Discussion  Additional Comments:    Robert Chan 04/09/2023, 3:41 PM

## 2023-04-09 NOTE — Progress Notes (Signed)
[facility] MD Progress Note  04/09/2023 1:33 PM Byren Pankow  MRN:  [id number] Subjective:  Patient is A+O x 4. He denies SI/HI/AVH. Affect flat. Mood pleasant. Appetite good. Adherent to taking medications. Patient has been calm, is compliant with his medicines and somewhat isolative on the floor. He reports feeling a bit drowsy today due to meds last night. His mood is depressed with some anxiety. He is denying current SI/HI or any AVH. He has fair appetite and is visible on the floor.  Principal Problem: MDD (major depressive disorder), recurrent severe, without psychosis (HCC) Diagnosis: Principal Problem:   MDD (major depressive disorder), recurrent severe, without psychosis (HCC)  Total Time spent with patient: 20 minutes  Past Psychiatric History: see H&P  Past Medical History:  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   Depression    Elevated serum creatinine 08/28/2019   Homelessness 06/19/2020   Hypertension    NSTEMI (non-ST elevated myocardial infarction) [facility])    Status post incision and drainage 04/22/2021   Suicidal ideation 06/05/2020    Past Surgical History:  Procedure Laterality Date   APPENDECTOMY     EYE SURGERY     LEFT HEART CATH AND CORONARY ANGIOGRAPHY N/A 06/05/2020   Procedure: LEFT HEART CATH AND CORONARY ANGIOGRAPHY;  Surgeon: Elder Negus, MD;  Location: [facility] INVASIVE CV LAB;  Service: Cardiovascular;  Laterality: N/A;   PROSTATE SURGERY     Family History:  Family History  Problem Relation Age of Onset   Hypertension Mother    Diabetes Mother    Hypertension Father    Hypertension Sister    Family Psychiatric  History: see H&P Social History:  Social History    Substance and Sexual Activity  Alcohol Use Yes   Comment: drinks 2x/wk, 2-3 40 oz drinks, last drink 2 weeks ago     Social History   Substance and Sexual Activity  Drug Use Not Currently   Types: Cocaine   Comment: cocaine use once a month, last use 6 mos ago    Social History   Socioeconomic History   Marital status: Single    Spouse name: Not on file   Number of children: Not on file   Years of education: Not on file   Highest education level: Not on file  Occupational History   Not on file  Tobacco Use   Smoking status: Some Days    Types: Cigarettes    Last attempt to quit: 12/25/2020    Years since quitting: 2.2   Smokeless tobacco: Never   Tobacco comments:    Smokes a couple cigarettes every now and then  Vaping Use   Vaping Use: Never used  Substance and Sexual Activity   Alcohol use: Yes    Comment: drinks 2x/wk, 2-3 40 oz drinks, last drink 2 weeks ago   Drug use: Not Currently    Types: Cocaine    Comment: cocaine use once a month, last use 6 mos ago   Sexual activity: Not Currently  Other Topics Concern   Not on file  Social History Narrative   His mother passed at age 63 (when he was 58 years old) and his father  raised him and his siblings   His parents were pastors as a lot of his sisters and other family members are today   There was a total of 13 children in his family Had 45 sisters, one sister deceased , Had 3 brothers, all brothers deceased   He is the youngest of the 70 and was "always in trouble in my younger years"   Family still live in [place], [place], some in [place], [place]   Has a Daughter and son with a total of 10 grandchildren (6 grand daughters local and 4 grandsons)    Values family   Married twice, present wife incarcerated   Social Determinants of Health   Financial Resource Strain: Low Risk  (10/19/2022)   Overall Financial Resource Strain (CARDIA)    Difficulty of Paying Living Expenses: Not hard at all  Food Insecurity:  No Food Insecurity (04/06/2023)   Hunger Vital Sign    Worried About Running Out of Food in the Last Year: Never true    Ran Out of Food in the Last Year: Never true  Transportation Needs: No Transportation Needs (04/06/2023)   PRAPARE - Administrator, Civil Service (Medical): No    Lack of Transportation (Non-Medical): No  Physical Activity: Inactive (10/19/2022)   Exercise Vital Sign    Days of Exercise per Week: 0 days    Minutes of Exercise per Session: 0 min  Stress: No Stress Concern Present (10/19/2022)   [name] of Occupational Health - Occupational Stress Questionnaire    Feeling of Stress : Not at all  Social Connections: Moderately Integrated (03/31/2022)   Social Connection and Isolation Panel [NHANES]    Frequency of Communication with Friends and Family: Three times a week    Frequency of Social Gatherings with Friends and Family: Three times a week    Attends Religious Services: 1 to 4 times per year    Active Member of Clubs or Organizations: Yes    Attends Banker Meetings: 1 to 4 times per year    Marital Status: Never married   Additional Social History:                         Sleep: Good  Appetite:  Good  Current Medications: Current Facility-Administered Medications  Medication Dose Route Frequency Provider Last Rate Last Admin   acetaminophen (TYLENOL) tablet 650 mg  650 mg Oral Q6H PRN [name], [name], NP       alum & mag hydroxide-simeth (MAALOX/MYLANTA) 200-200-20 MG/5ML suspension 30 mL  30 mL Oral Q4H PRN [name], [name], NP       ARIPiprazole (ABILIFY) tablet 10 mg  10 mg Oral QPC breakfast [name], DO   10 mg at 04/09/23 1610   atorvastatin (LIPITOR) tablet 80 mg  80 mg Oral Daily [name], DO   80 mg at 04/09/23 9604   diltiazem (CARDIZEM CD) 24 hr capsule 300 mg  300 mg Oral Daily [name], DO   300 mg at 04/09/23 5409   doxepin (SINEQUAN) capsule 50 mg   50 mg Oral QHS [name], DO   50 mg at 04/08/23 2122   hydrOXYzine (ATARAX) tablet 25 mg  25 mg Oral TID PRN [name], [name], NP       LORazepam (ATIVAN) injection 2 mg  2 mg Intramuscular TID PRN [name], [name], NP       LORazepam (ATIVAN)  tablet 1 mg  1 mg Oral Q4H PRN [name], DO       magnesium hydroxide (MILK OF MAGNESIA) suspension 30 mL  30 mL Oral Daily PRN [name], [name], NP       mirtazapine (REMERON) tablet 30 mg  30 mg Oral QHS [name], DO   30 mg at 04/08/23 2122   OLANZapine (ZYPREXA) tablet 10 mg  10 mg Oral Q6H PRN [name], DO       traZODone (DESYREL) tablet 50 mg  50 mg Oral QHS PRN [name], [name], NP   50 mg at 04/08/23 2121    Lab Results: No results found for this or any previous visit (from the past 48 hour(s)).  Blood Alcohol level:  Lab Results  Component Value Date   ETH <10 04/05/2023   ETH <10 01/10/2023    Metabolic Disorder Labs: Lab Results  Component Value Date   HGBA1C 5.2 01/13/2023   MPG 103 01/13/2023   MPG 91.06 09/07/2020   No results found for: "PROLACTIN" Lab Results  Component Value Date   CHOL 168 01/13/2023   TRIG 62 01/13/2023   HDL 57 01/13/2023   CHOLHDL 2.9 01/13/2023   VLDL 12 01/13/2023   LDLCALC 99 01/13/2023   LDLCALC 104 (H) 05/17/2022    Physical Findings: AIMS:  , ,  ,  ,    CIWA:    COWS:     Musculoskeletal: Strength & Muscle Tone: within normal limits Gait & Station: unsteady Patient leans: N/A  Psychiatric Specialty Exam:  Presentation  General Appearance:  Appropriate for Environment; Casual  Eye Contact: Fair  Speech: Normal Rate  Speech Volume: Decreased  Handedness: Right   Mood and Affect  Mood: Depressed  Affect: Appropriate; Congruent   Thought Process  Thought Processes: Goal Directed; Coherent  Descriptions of Associations:Intact  Orientation:Full (Time, Place and Person)  Thought  Content:Logical  History of Schizophrenia/Schizoaffective disorder:No  Duration of Psychotic Symptoms:Less than six months  Hallucinations:Hallucinations: None  Ideas of Reference:None  Suicidal Thoughts:Suicidal Thoughts: No  Homicidal Thoughts:Homicidal Thoughts: No   Sensorium  Memory: Immediate Fair; Remote Fair  Judgment: Fair  Insight: Fair   Art therapist  Concentration: Fair  Attention Span: Fair  Recall: Fair  Fund of Knowledge: Fair  Language: Fair   Psychomotor Activity  Psychomotor Activity:Psychomotor Activity: Psychomotor Retardation   Assets  Assets: Communication Skills; Desire for Improvement; Financial Resources/Insurance; Housing   Sleep  Sleep:Sleep: Fair    Physical Exam: Physical Exam ROS Blood pressure 137/83, pulse 69, temperature 98.6 F (37 C), temperature source Oral, resp. rate 20, height 6\' 4"  (1.93 m), weight 113.8 kg, SpO2 100 %. Body mass index is 30.54 kg/m.   Treatment Plan Summary: Daily contact with patient to assess and evaluate symptoms and progress in treatment, Medication management, and Plan : Continue current meds and doses.   Leo Rod 04/09/2023, 1:33 PM

## 2023-04-10 NOTE — Progress Notes (Signed)
[facility] MD Progress Note  04/10/2023 11:19 AM Terrall Hinzman  MRN:  [id number] Subjective:  AAOX3. Calm and cooperative. Flat affect. States feeling so/so. Visible in dayroom. Limited interaction with peers and staff. Attends group. Po med compliant. Tol well. No behavior issues noted. Denies SI/HI/AV/HI.   Patient has been calm, is compliant with his meds and had some disturbed sleep last night. He reports mild depression and occasional AH. He has fair appetite, is taking care of himself and is attending groups.  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   Depression    Elevated serum creatinine 08/28/2019   Homelessness 06/19/2020   Hypertension    NSTEMI (non-ST elevated myocardial infarction) [facility])    Status post incision and drainage 04/22/2021   Suicidal ideation 06/05/2020    Past Surgical History:  Procedure Laterality Date   APPENDECTOMY     EYE SURGERY     LEFT HEART CATH AND CORONARY ANGIOGRAPHY N/A 06/05/2020   Procedure: LEFT HEART CATH AND CORONARY ANGIOGRAPHY;  Surgeon: Elder Negus, MD;  Location: [facility] INVASIVE CV LAB;  Service: Cardiovascular;  Laterality: N/A;   PROSTATE SURGERY     Family History:  Family History  Problem Relation Age of Onset   Hypertension Mother    Diabetes Mother    Hypertension Father    Hypertension Sister    Family Psychiatric  History:  Social History:  Social  History   Substance and Sexual Activity  Alcohol Use Yes   Comment: drinks 2x/wk, 2-3 40 oz drinks, last drink 2 weeks ago     Social History   Substance and Sexual Activity  Drug Use Not Currently   Types: Cocaine   Comment: cocaine use once a month, last use 6 mos ago    Social History   Socioeconomic History   Marital status: Single    Spouse name: Not on file   Number of children: Not on file   Years of education: Not on file   Highest education level: Not on file  Occupational History   Not on file  Tobacco Use   Smoking status: Some Days    Types: Cigarettes    Last attempt to quit: 12/25/2020    Years since quitting: 2.2   Smokeless tobacco: Never   Tobacco comments:    Smokes a couple cigarettes every now and then  Vaping Use   Vaping Use: Never used  Substance and Sexual Activity   Alcohol use: Yes    Comment: drinks 2x/wk, 2-3 40 oz drinks, last drink 2 weeks ago   Drug use: Not Currently    Types: Cocaine    Comment: cocaine use once a month, last use 6 mos ago   Sexual activity: Not Currently  Other Topics Concern   Not on file  Social History Narrative   His mother passed at age 41 (when he was 64 years old) and his father raised him and  his siblings   His parents were pastors as a lot of his sisters and other family members are today   There was a total of 13 children in his family Had 68 sisters, one sister deceased , Had 3 brothers, all brothers deceased   He is the youngest of the 38 and was "always in trouble in my younger years"   Family still live in [place], [place], some in [place], [place]   Has a Daughter and son with a total of 10 grandchildren (6 grand daughters local and 4 grandsons)    Values family   Married twice, present wife incarcerated   Social Determinants of Health   Financial Resource Strain: Low Risk  (10/19/2022)   Overall Financial Resource Strain (CARDIA)    Difficulty of Paying Living Expenses: Not hard at all  Food  Insecurity: No Food Insecurity (04/06/2023)   Hunger Vital Sign    Worried About Running Out of Food in the Last Year: Never true    Ran Out of Food in the Last Year: Never true  Transportation Needs: No Transportation Needs (04/06/2023)   PRAPARE - Administrator, Civil Service (Medical): No    Lack of Transportation (Non-Medical): No  Physical Activity: Inactive (10/19/2022)   Exercise Vital Sign    Days of Exercise per Week: 0 days    Minutes of Exercise per Session: 0 min  Stress: No Stress Concern Present (10/19/2022)   [name] of Occupational Health - Occupational Stress Questionnaire    Feeling of Stress : Not at all  Social Connections: Moderately Integrated (03/31/2022)   Social Connection and Isolation Panel [NHANES]    Frequency of Communication with Friends and Family: Three times a week    Frequency of Social Gatherings with Friends and Family: Three times a week    Attends Religious Services: 1 to 4 times per year    Active Member of Clubs or Organizations: Yes    Attends Banker Meetings: 1 to 4 times per year    Marital Status: Never married   Additional Social History:                         Sleep: Fair  Appetite:  Fair  Current Medications: Current Facility-Administered Medications  Medication Dose Route Frequency Provider Last Rate Last Admin   acetaminophen (TYLENOL) tablet 650 mg  650 mg Oral Q6H PRN [name], [name], NP       alum & mag hydroxide-simeth (MAALOX/MYLANTA) 200-200-20 MG/5ML suspension 30 mL  30 mL Oral Q4H PRN [name], [name], NP       ARIPiprazole (ABILIFY) tablet 10 mg  10 mg Oral QPC breakfast [name], DO   10 mg at 04/10/23 0930   atorvastatin (LIPITOR) tablet 80 mg  80 mg Oral Daily [name], DO   80 mg at 04/10/23 1610   diltiazem (CARDIZEM CD) 24 hr capsule 300 mg  300 mg Oral Daily [name], DO   300 mg at 04/10/23 0934   doxepin (SINEQUAN)  capsule 50 mg  50 mg Oral QHS [name], DO   50 mg at 04/09/23 2127   hydrOXYzine (ATARAX) tablet 25 mg  25 mg Oral TID PRN [name], [name], NP       LORazepam (ATIVAN) injection 2 mg  2 mg Intramuscular TID PRN [name], [name], NP       LORazepam (ATIVAN) tablet 1 mg  1 mg Oral Q4H PRN [name], DO       magnesium hydroxide (MILK OF MAGNESIA) suspension 30 mL  30 mL Oral Daily PRN [name], [name], NP       mirtazapine (REMERON) tablet 30 mg  30 mg Oral QHS [name], DO   30 mg at 04/09/23 2127   OLANZapine (ZYPREXA) tablet 10 mg  10 mg Oral Q6H PRN [name], DO       traZODone (DESYREL) tablet 50 mg  50 mg Oral QHS PRN [name], [name], NP   50 mg at 04/08/23 2121    Lab Results: No results found for this or any previous visit (from the past 48 hour(s)).  Blood Alcohol level:  Lab Results  Component Value Date   ETH <10 04/05/2023   ETH <10 01/10/2023    Metabolic Disorder Labs: Lab Results  Component Value Date   HGBA1C 5.2 01/13/2023   MPG 103 01/13/2023   MPG 91.06 09/07/2020   No results found for: "PROLACTIN" Lab Results  Component Value Date   CHOL 168 01/13/2023   TRIG 62 01/13/2023   HDL 57 01/13/2023   CHOLHDL 2.9 01/13/2023   VLDL 12 01/13/2023   LDLCALC 99 01/13/2023   LDLCALC 104 (H) 05/17/2022    Physical Findings: AIMS:  , ,  ,  ,    CIWA:    COWS:     Musculoskeletal: Strength & Muscle Tone: within normal limits Gait & Station: normal Patient leans: N/A  Psychiatric Specialty Exam:  Presentation  General Appearance:  Appropriate for Environment; Casual  Eye Contact: Fair  Speech: Normal Rate  Speech Volume: Decreased  Handedness: Right   Mood and Affect  Mood: Depressed  Affect: Appropriate; Congruent   Thought Process  Thought Processes: Goal Directed; Coherent  Descriptions of Associations:Intact  Orientation:Full (Time, Place and  Person)  Thought Content:Logical  History of Schizophrenia/Schizoaffective disorder:No  Duration of Psychotic Symptoms:Less than six months  Hallucinations:Hallucinations: None  Ideas of Reference:None  Suicidal Thoughts:Suicidal Thoughts: No  Homicidal Thoughts:Homicidal Thoughts: No   Sensorium  Memory: Immediate Fair; Remote Fair  Judgment: Fair  Insight: Fair   Art therapist  Concentration: Fair  Attention Span: Fair  Recall: [employer] of Knowledge: Fair  Language: Fair   Psychomotor Activity  Psychomotor Activity: Psychomotor Activity: Psychomotor Retardation   Assets  Assets: Communication Skills; Desire for Improvement; Financial Resources/Insurance; Housing   Sleep  Sleep: Sleep: Fair    Physical Exam: Physical Exam Constitutional:      Appearance: Normal appearance.  HENT:     Head: Normocephalic and atraumatic.     Right Ear: External ear normal.     Left Ear: External ear normal.     Nose: Nose normal.     Mouth/Throat:     Mouth: Mucous membranes are moist.  Eyes:     Extraocular Movements: Extraocular movements intact.     Conjunctiva/sclera: Conjunctivae normal.     Pupils: Pupils are equal, round, and reactive to light.  Cardiovascular:     Rate and Rhythm: Normal rate and regular rhythm.     Pulses: Normal pulses.     Heart sounds: Normal heart sounds.  Abdominal:     General: Abdomen is flat. Bowel sounds are normal.     Palpations: Abdomen is soft.  Musculoskeletal:        General: Normal range of motion.     Cervical back: Normal range of motion and neck supple.  Skin:    General: Skin is warm and dry.  Neurological:     General: No focal deficit present.     Mental Status: He is alert and oriented to person, place, and time. Mental status is at baseline.    Review of Systems  Constitutional: Negative.   HENT: Negative.    Eyes: Negative.   Respiratory: Negative.    Cardiovascular: Negative.    Gastrointestinal: Negative.   Genitourinary: Negative.   Musculoskeletal: Negative.   Skin: Negative.   Neurological: Negative.   Endo/Heme/Allergies: Negative.   Psychiatric/Behavioral:  The patient is nervous/anxious.    Blood pressure (!) 156/93, pulse 73, temperature 98.2 F (36.8 C), resp. rate 18, height 6\' 4"  (1.93 m), weight 113.8 kg, SpO2 100 %. Body mass index is 30.54 kg/m.   Treatment Plan Summary: Daily contact with patient to assess and evaluate symptoms and progress in treatment, Medication management, and Plan : Continue current meds and doses.   Alyss Granato 04/10/2023, 11:19 AM

## 2023-04-10 NOTE — Progress Notes (Signed)
Patient is alert and oriented X4.  Patient denies SI/HI/AVH.  Patient denies pain.  Patient spent sometime in the dayroom.  Patient is pleasant and cooperative.  Patient is compliant with taking his medications.  Patient remains on Q15 minute checks.

## 2023-04-10 NOTE — Group Note (Signed)
LCSW Group Therapy Note  Group Date: 04/10/2023 Start Time: 1318 End Time: 1348   Type of Therapy and Topic:  Group Therapy - Coping Skills  Participation Level:  Minimal   Description of Group The focus of this group was to determine what unhealthy coping techniques typically are used by group members and what healthy coping techniques would be helpful in coping with various problems. Patients were guided in becoming aware of the differences between healthy and unhealthy coping techniques. Patients were asked to identify 2-3 healthy coping skills they would like to learn to use more effectively.  Therapeutic Goals Patients learned that coping is what human beings do all day long to deal with various situations in their lives Patients defined and discussed healthy vs unhealthy coping techniques Patients identified their preferred coping techniques and identified whether these were healthy or unhealthy Patients determined 2-3 healthy coping skills they would like to become more familiar with and use more often. Patients provided support and ideas to each other   Summary of Patient Progress:  The patient attended group. The patient played the Coping Skills bingo provided by the LCSWA but did not participate in the discussion.    Marshell Levan, LCSWA 04/10/2023  1:52 PM

## 2023-04-10 NOTE — Group Note (Signed)
Date:  04/10/2023 Time:  9:43 PM  Group Topic/Focus:  Building Self Esteem:   The Focus of this group is helping patients become aware of the effects of self-esteem on their lives, the things they and others do that enhance or undermine their self-esteem, seeing the relationship between their level of self-esteem and the choices they make and learning ways to enhance self-esteem. Coping With Mental Health Crisis:   The purpose of this group is to help patients identify strategies for coping with mental health crisis.  Group discusses possible causes of crisis and ways to manage them effectively. Conflict Resolution:   The focus of this group is to discuss the conflict resolution process and how it may be used upon discharge. Developing a Wellness Toolbox:   The focus of this group is to help patients develop a "wellness toolbox" with skills and strategies to promote recovery upon discharge. Early Warning Signs:   The focus of this group is to help patients identify signs or symptoms they exhibit before slipping into an unhealthy state or crisis. Emotional Education:   The focus of this group is to discuss what feelings/emotions are, and how they are experienced. Goals Group:   The focus of this group is to help patients establish daily goals to achieve during treatment and discuss how the patient can incorporate goal setting into their daily lives to aide in recovery. Healthy Communication:   The focus of this group is to discuss communication, barriers to communication, as well as healthy ways to communicate with others. Identifying Needs:   The focus of this group is to help patients identify their personal needs that have been historically problematic and identify healthy behaviors to address their needs. Making Healthy Choices:   The focus of this group is to help patients identify negative/unhealthy choices they were using prior to admission and identify positive/healthier coping strategies to  replace them upon discharge. Managing Feelings:   The focus of this group is to identify what feelings patients have difficulty handling and develop a plan to handle them in a healthier way upon discharge. Overcoming Stress:   The focus of this group is to define stress and help patients assess their triggers. Personal Choices and Values:   The focus of this group is to help patients assess and explore the importance of values in their lives, how their values affect their decisions, how they express their values and what opposes their expression.    Participation Level:  Active  Participation Quality:  Appropriate  Affect:  Appropriate  Cognitive:  Alert and Appropriate  Insight: Appropriate, Good, and Improving  Engagement in Group:  Engaged, Improving, and Supportive  Modes of Intervention:  Discussion and Support  Additional Comments:    Robert Chan 04/10/2023, 9:43 PM

## 2023-04-10 NOTE — [facility] Group Notes (Signed)
Time: 10:00am-11:30   Group Topic/Focus:  Healthy Communication: Support systems communication  Love Languages      Participation Level:  Active   Participation Quality:  Appropriate   Affect:  Appropriate   Cognitive:  Appropriate   Insight: Good   Engagement in Group:  Engaged   Modes of Intervention:  Education   Additional Comments:     

## 2023-04-10 NOTE — Plan of Care (Signed)
  Problem: Education: Goal: Knowledge of General Education information will improve Description: Including pain rating scale, medication(s)/side effects and non-pharmacologic comfort measures Outcome: Progressing   Problem: Pain Managment: Goal: General experience of comfort will improve Outcome: Progressing   Problem: Safety: Goal: Ability to remain free from injury will improve Outcome: Progressing   

## 2023-04-10 NOTE — Plan of Care (Signed)
AAOX3. Calm and cooperative. Flat affect. States feeling so/so. Visible in dayroom. Limited interaction with peers and staff. Attends group. Po med compliant. Tol well. No behavior issues noted. Denies SI/HI/AV/H. Q 15 min checks maintained for safety. Plan of care continued.   Problem: Activity: Goal: Risk for activity intolerance will decrease Outcome: Progressing   Problem: Coping: Goal: Level of anxiety will decrease Outcome: Progressing   Problem: Elimination: Goal: Will not experience complications related to bowel motility Outcome: Progressing Goal: Will not experience complications related to urinary retention Outcome: Progressing   Problem: Pain Managment: Goal: General experience of comfort will improve Outcome: Progressing   Problem: Safety: Goal: Ability to remain free from injury will improve Outcome: Progressing   Problem: Skin Integrity: Goal: Risk for impaired skin integrity will decrease Outcome: Progressing

## 2023-04-11 DIAGNOSIS — F332 Major depressive disorder, recurrent severe without psychotic features: Secondary | ICD-10-CM | POA: Diagnosis not present

## 2023-04-11 MED ORDER — LATANOPROST 0.005 % OP SOLN
1.0000 [drp] | Freq: Every day | OPHTHALMIC | Status: DC
  Administered 2023-04-11 – 2023-04-18 (×8): 1 [drp] via OPHTHALMIC
  Filled 2023-04-11: qty 2.5

## 2023-04-11 MED ORDER — ALBUTEROL SULFATE HFA 108 (90 BASE) MCG/ACT IN AERS: 2.0000 | INHALATION_SPRAY | Freq: Four times a day (QID) | RESPIRATORY_TRACT | Status: DC | PRN

## 2023-04-11 MED ORDER — MOMETASONE FURO-FORMOTEROL FUM 200-5 MCG/ACT IN AERO
2.0000 | INHALATION_SPRAY | Freq: Two times a day (BID) | RESPIRATORY_TRACT | Status: DC
  Administered 2023-04-11 – 2023-04-19 (×17): 2 via RESPIRATORY_TRACT
  Filled 2023-04-11: qty 8.8

## 2023-04-11 NOTE — Plan of Care (Signed)
Calm and cooperative. Visible in dayroom. Watching tv. Attends group. Po med compliant. No behavior issues noted. Denies SI/HI/AV/H. No c/o pain/discomfort noted. Q 15 min  Checks maintained for safety. Plan of care continued.   Problem: Activity: Goal: Risk for activity intolerance will decrease Outcome: Progressing   Problem: Nutrition: Goal: Adequate nutrition will be maintained Outcome: Progressing   Problem: Coping: Goal: Level of anxiety will decrease Outcome: Progressing   Problem: Elimination: Goal: Will not experience complications related to bowel motility Outcome: Progressing Goal: Will not experience complications related to urinary retention Outcome: Progressing   Problem: Pain Managment: Goal: General experience of comfort will improve Outcome: Progressing   Problem: Safety: Goal: Ability to remain free from injury will improve Outcome: Progressing

## 2023-04-11 NOTE — Group Note (Signed)
LCSW Group Therapy Note  Group Date: 04/11/2023 Start Time: 1305 End Time: 1400   Type of Therapy and Topic:  Group Therapy - Healthy vs Unhealthy Coping Skills  Participation Level:  Minimal   Description of Group The focus of this group was to determine what unhealthy coping techniques typically are used by group members and what healthy coping techniques would be helpful in coping with various problems. Patients were guided in becoming aware of the differences between healthy and unhealthy coping techniques. Patients were asked to identify 2-3 healthy coping skills they would like to learn to use more effectively.  Therapeutic Goals Patients learned that coping is what human beings do all day long to deal with various situations in their lives Patients defined and discussed healthy vs unhealthy coping techniques Patients identified their preferred coping techniques and identified whether these were healthy or unhealthy Patients determined 2-3 healthy coping skills they would like to become more familiar with and use more often. Patients provided support and ideas to each other   Summary of Patient Progress:   Patient was present in group.  Patient was alert and attentive, though did not engage in group discussions.  Patient proved open to input from peers and feedback from CSW. Unable to ascertain patient's insight due to limited discussions in group.    Therapeutic Modalities Cognitive Behavioral Therapy Motivational Interviewing  Claudie Fisherman 04/11/2023  1:57 PM

## 2023-04-11 NOTE — Progress Notes (Signed)
[facility] MD Progress Note  04/11/2023 4:33 PM Robert Chan  MRN:  [id number] Subjective: Follow-up patient with depression.  Patient says his mood is a little better but still feels depressed.  Also says that at times he still will "see things".  Mostly "shadows".  He has complaints that he is not getting his eyedrops and does not have both of his inhalers prescribed Principal Problem: MDD (major depressive disorder), recurrent severe, without psychosis (HCC) Diagnosis: Principal Problem:   MDD (major depressive disorder), recurrent severe, without psychosis (HCC)  Total Time spent with patient: 30 minutes  Past Psychiatric History: Past history of depression and substance use multiple medical problems  Past Medical History:  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   Depression    Elevated serum creatinine 08/28/2019   Homelessness 06/19/2020   Hypertension    NSTEMI (non-ST elevated myocardial infarction) (HCC)    Status post incision and drainage 04/22/2021   Suicidal ideation 06/05/2020    Past Surgical History:  Procedure Laterality Date   APPENDECTOMY     EYE SURGERY     LEFT HEART CATH AND CORONARY ANGIOGRAPHY N/A 06/05/2020   Procedure: LEFT HEART CATH AND CORONARY ANGIOGRAPHY;  Surgeon: Elder Negus, MD;  Location: [facility];  Service: Cardiovascular;  Laterality: N/A;   PROSTATE SURGERY     Family History:  Family History  Problem Relation Age of Onset   Hypertension Mother    Diabetes Mother    Hypertension Father    Hypertension Sister    Family Psychiatric  History: See previous Social History:  Social History   Substance and Sexual Activity  Alcohol Use Yes   Comment:  drinks 2x/wk, 2-3 40 oz drinks, last drink 2 weeks ago     Social History   Substance and Sexual Activity  Drug Use Not Currently   Types: Cocaine   Comment: cocaine use once a month, last use 6 mos ago    Social History   Socioeconomic History   Marital status: Single    Spouse name: Not on file   Number of children: Not on file   Years of education: Not on file   Highest education level: Not on file  Occupational History   Not on file  Tobacco Use   Smoking status: Some Days    Types: Cigarettes    Last attempt to quit: 12/25/2020    Years since quitting: 2.2   Smokeless tobacco: Never   Tobacco comments:    Smokes a couple cigarettes every now and then  Vaping Use   Vaping Use: Never used  Substance and Sexual Activity   Alcohol use: Yes    Comment: drinks 2x/wk, 2-3 40 oz drinks, last drink 2 weeks ago   Drug use: Not Currently    Types: Cocaine    Comment: cocaine use once a month, last use 6 mos ago   Sexual activity: Not Currently  Other Topics Concern   Not on file  Social History Narrative   His mother passed at age 1 (when he was 55 years old) and his father raised him and his siblings   His parents were pastors as a  lot of his sisters and other family members are today   There was a total of 13 children in his family Had 16 sisters, one sister deceased , Had 3 brothers, all brothers deceased   He is the youngest of the 41 and was "always in trouble in my younger years"   Family still live in [place], [place], some in [place], [place]   Has a Daughter and son with a total of 10 grandchildren (6 grand daughters local and 4 grandsons)    Values family   Married twice, present wife incarcerated   Social Determinants of Health   Financial Resource Strain: Low Risk  (10/19/2022)   Overall Financial Resource Strain (CARDIA)    Difficulty of Paying Living Expenses: Not hard at all  Food Insecurity: No Food Insecurity (04/06/2023)   Hunger Vital Sign    Worried  About Running Out of Food in the Last Year: Never true    Ran Out of Food in the Last Year: Never true  Transportation Needs: No Transportation Needs (04/06/2023)   PRAPARE - Administrator, Civil Service (Medical): No    Lack of Transportation (Non-Medical): No  Physical Activity: Inactive (10/19/2022)   Exercise Vital Sign    Days of Exercise per Week: 0 days    Minutes of Exercise per Session: 0 min  Stress: No Stress Concern Present (10/19/2022)   [name] of Occupational Health - Occupational Stress Questionnaire    Feeling of Stress : Not at all  Social Connections: Moderately Integrated (03/31/2022)   Social Connection and Isolation Panel [NHANES]    Frequency of Communication with Friends and Family: Three times a week    Frequency of Social Gatherings with Friends and Family: Three times a week    Attends Religious Services: 1 to 4 times per year    Active Member of Clubs or Organizations: Yes    Attends Banker Meetings: 1 to 4 times per year    Marital Status: Never married   Additional Social History:                         Sleep: Fair  Appetite:  Fair  Current Medications: Current Facility-Administered Medications  Medication Dose Route Frequency Provider Last Rate Last Admin   acetaminophen (TYLENOL) tablet 650 mg  650 mg Oral Q6H PRN [name], [name], NP       albuterol (VENTOLIN HFA) 108 (90 Base) MCG/ACT inhaler 2 puff  2 puff Inhalation Q6H PRN Jameshia Hayashida T, MD       alum & mag hydroxide-simeth (MAALOX/MYLANTA) 200-200-20 MG/5ML suspension 30 mL  30 mL Oral Q4H PRN [name], [name], NP       ARIPiprazole (ABILIFY) tablet 10 mg  10 mg Oral QPC breakfast [name], DO   10 mg at 04/11/23 0829   atorvastatin (LIPITOR) tablet 80 mg  80 mg Oral Daily [name], DO   80 mg at 04/11/23 0829   diltiazem (CARDIZEM CD) 24 hr capsule 300 mg  300 mg Oral Daily [name], DO   300 mg  at 04/11/23 0829   doxepin (SINEQUAN) capsule 50 mg  50 mg Oral QHS [name], DO   50 mg at 04/10/23 2126   hydrOXYzine (ATARAX) tablet 25 mg  25 mg Oral TID PRN [name], [name], NP       latanoprost (XALATAN) 0.005 % ophthalmic solution 1 drop  1  drop Both Eyes QHS Margie Urbanowicz T, MD       LORazepam (ATIVAN) injection 2 mg  2 mg Intramuscular TID PRN [name], [name], NP       LORazepam (ATIVAN) tablet 1 mg  1 mg Oral Q4H PRN [name], DO       magnesium hydroxide (MILK OF MAGNESIA) suspension 30 mL  30 mL Oral Daily PRN [name], [name], NP       mirtazapine (REMERON) tablet 30 mg  30 mg Oral QHS [name], DO   30 mg at 04/10/23 2126   mometasone-formoterol (DULERA) 200-5 MCG/ACT inhaler 2 puff  2 puff Inhalation BID [name], Kashif   2 puff at 04/11/23 0830   OLANZapine (ZYPREXA) tablet 10 mg  10 mg Oral Q6H PRN [name], DO       traZODone (DESYREL) tablet 50 mg  50 mg Oral QHS PRN [name], [name], NP   50 mg at 04/08/23 2121    Lab Results: No results found for this or any previous visit (from the past 48 hour(s)).  Blood Alcohol level:  Lab Results  Component Value Date   ETH <10 04/05/2023   ETH <10 01/10/2023    Metabolic Disorder Labs: Lab Results  Component Value Date   HGBA1C 5.2 01/13/2023   MPG 103 01/13/2023   MPG 91.06 09/07/2020   No results found for: "PROLACTIN" Lab Results  Component Value Date   CHOL 168 01/13/2023   TRIG 62 01/13/2023   HDL 57 01/13/2023   CHOLHDL 2.9 01/13/2023   VLDL 12 01/13/2023   LDLCALC 99 01/13/2023   LDLCALC 104 (H) 05/17/2022    Physical Findings: AIMS:  , ,  ,  ,    CIWA:    COWS:     Musculoskeletal: Strength & Muscle Tone: within normal limits Gait & Station: normal Patient leans: N/A  Psychiatric Specialty Exam:  Presentation  General Appearance:  Appropriate for Environment; Casual  Eye Contact: Fair  Speech: Normal Rate  Speech  Volume: Decreased  Handedness: Right   Mood and Affect  Mood: Depressed  Affect: Appropriate; Congruent   Thought Process  Thought Processes: Goal Directed; Coherent  Descriptions of Associations:Intact  Orientation:Full (Time, Place and Person)  Thought Content:Logical  History of Schizophrenia/Schizoaffective disorder:No  Duration of Psychotic Symptoms:Less than six months  Hallucinations:No data recorded Ideas of Reference:None  Suicidal Thoughts:No data recorded Homicidal Thoughts:No data recorded  Sensorium  Memory: Immediate Fair; Remote Fair  Judgment: Fair  Insight: Fair   Art therapist  Concentration: Fair  Attention Span: Fair  Recall: [employer] of Knowledge: Fair  Language: Fair   Psychomotor Activity  Psychomotor Activity:No data recorded  Assets  Assets: Communication Skills; Desire for Improvement; Financial Resources/Insurance; Housing   Sleep  Sleep:No data recorded   Physical Exam: Physical Exam Vitals and nursing note reviewed.  Constitutional:      Appearance: Normal appearance.  HENT:     Head: Normocephalic and atraumatic.     Mouth/Throat:     Pharynx: Oropharynx is clear.  Eyes:     Pupils: Pupils are equal, round, and reactive to light.  Cardiovascular:     Rate and Rhythm: Normal rate and regular rhythm.  Pulmonary:     Effort: Pulmonary effort is normal.     Breath sounds: Normal breath sounds.  Abdominal:     General: Abdomen is flat.     Palpations: Abdomen is soft.  Musculoskeletal:  General: Normal range of motion.  Skin:    General: Skin is warm and dry.  Neurological:     General: No focal deficit present.     Mental Status: He is alert. Mental status is at baseline.  Psychiatric:        Attention and Perception: Attention normal.        Mood and Affect: Mood normal. Affect is blunt.        Speech: Speech normal.        Behavior: Behavior is cooperative.         Thought Content: Thought content normal.        Cognition and Memory: Cognition normal.    Review of Systems  Constitutional: Negative.   HENT: Negative.    Eyes: Negative.   Respiratory: Negative.    Cardiovascular: Negative.   Gastrointestinal: Negative.   Musculoskeletal: Negative.   Skin: Negative.   Neurological: Negative.   Psychiatric/Behavioral:  Positive for depression and hallucinations. Negative for suicidal ideas.    Blood pressure (!) 143/78, pulse 70, temperature (!) 97.2 F (36.2 C), resp. rate 15, height 6\' 4"  (1.93 m), weight 113.8 kg, SpO2 99 %. Body mass index is 30.54 kg/m.   Treatment Plan Summary: Plan medicine reviewed.  Reordered his albuterol inhaler as needed also ordered his Xalatan eyedrops.  Encourage group attendance.  Reassured patient that the shadows do not sound like a severe psychotic symptom.  Start working with treatment team on discharge planning  Mordecai Rasmussen, MD 04/11/2023, 4:33 PM

## 2023-04-11 NOTE — Group Note (Signed)
Date:  04/11/2023 Time:  9:23 PM  Group Topic/Focus:  Building Self Esteem:   The Focus of this group is helping patients become aware of the effects of self-esteem on their lives, the things they and others do that enhance or undermine their self-esteem, seeing the relationship between their level of self-esteem and the choices they make and learning ways to enhance self-esteem. Coping With Mental Health Crisis:   The purpose of this group is to help patients identify strategies for coping with mental health crisis.  Group discusses possible causes of crisis and ways to manage them effectively. Developing a Wellness Toolbox:   The focus of this group is to help patients develop a "wellness toolbox" with skills and strategies to promote recovery upon discharge. Dimensions of Wellness:   The focus of this group is to introduce the topic of wellness and discuss the role each dimension of wellness plays in total health. Goals Group:   The focus of this group is to help patients establish daily goals to achieve during treatment and discuss how the patient can incorporate goal setting into their daily lives to aide in recovery. Healthy Communication:   The focus of this group is to discuss communication, barriers to communication, as well as healthy ways to communicate with others. Making Healthy Choices:   The focus of this group is to help patients identify negative/unhealthy choices they were using prior to admission and identify positive/healthier coping strategies to replace them upon discharge. Managing Feelings:   The focus of this group is to identify what feelings patients have difficulty handling and develop a plan to handle them in a healthier way upon discharge. Overcoming Stress:   The focus of this group is to define stress and help patients assess their triggers. Personal Choices and Values:   The focus of this group is to help patients assess and explore the importance of values in their  lives, how their values affect their decisions, how they express their values and what opposes their expression.    Participation Level:  Active  Participation Quality:  Appropriate  Affect:  Appropriate  Cognitive:  Alert and Appropriate  Insight: Appropriate and Good  Engagement in Group:  Engaged  Modes of Intervention:  Discussion  Additional Comments:    Robert Chan 04/11/2023, 9:23 PM

## 2023-04-11 NOTE — Plan of Care (Signed)

## 2023-04-11 NOTE — BH IP Treatment Plan (Signed)
Interdisciplinary Treatment and Diagnostic Plan Update  04/11/2023 Time of Session: 10:00AM Robert Chan MRN: [id number]  Principal Diagnosis: MDD (major depressive disorder), recurrent severe, without psychosis (HCC)  Secondary Diagnoses: Principal Problem:   MDD (major depressive disorder), recurrent severe, without psychosis (HCC)   Current Medications:  Current Facility-Administered Medications  Medication Dose Route Frequency Provider Last Rate Last Admin   acetaminophen (TYLENOL) tablet 650 mg  650 mg Oral Q6H PRN [name], [name], NP       alum & mag hydroxide-simeth (MAALOX/MYLANTA) 200-200-20 MG/5ML suspension 30 mL  30 mL Oral Q4H PRN [name], [name], NP       ARIPiprazole (ABILIFY) tablet 10 mg  10 mg Oral QPC breakfast [name], DO   10 mg at 04/11/23 0829   atorvastatin (LIPITOR) tablet 80 mg  80 mg Oral Daily [name], DO   80 mg at 04/11/23 4098   diltiazem (CARDIZEM CD) 24 hr capsule 300 mg  300 mg Oral Daily [name], DO   300 mg at 04/11/23 1191   doxepin (SINEQUAN) capsule 50 mg  50 mg Oral QHS [name], DO   50 mg at 04/10/23 2126   hydrOXYzine (ATARAX) tablet 25 mg  25 mg Oral TID PRN [name], [name], NP       LORazepam (ATIVAN) injection 2 mg  2 mg Intramuscular TID PRN [name], [name], NP       LORazepam (ATIVAN) tablet 1 mg  1 mg Oral Q4H PRN [name], DO       magnesium hydroxide (MILK OF MAGNESIA) suspension 30 mL  30 mL Oral Daily PRN [name], [name], NP       mirtazapine (REMERON) tablet 30 mg  30 mg Oral QHS [name], DO   30 mg at 04/10/23 2126   mometasone-formoterol (DULERA) 200-5 MCG/ACT inhaler 2 puff  2 puff Inhalation BID [name], Kashif   2 puff at 04/11/23 0830   OLANZapine (ZYPREXA) tablet 10 mg  10 mg Oral Q6H PRN [name], DO       traZODone (DESYREL) tablet 50 mg  50 mg Oral QHS PRN [name], [name], NP   50 mg at 04/08/23  2121   PTA Medications: Medications Prior to Admission  Medication Sig Dispense Refill Last Dose   albuterol (VENTOLIN HFA) 108 (90 Base) MCG/ACT inhaler Inhale 1-2 puffs into the lungs every 6 (six) hours as needed for wheezing or shortness of breath. 90 g 2    ARIPiprazole (ABILIFY) 15 MG tablet Take 1 tablet (15 mg total) by mouth daily. 30 tablet 3    atorvastatin (LIPITOR) 80 MG tablet Take 1 tablet (80 mg total) by mouth daily. 30 tablet 1    budesonide-formoterol (SYMBICORT) 160-4.5 MCG/ACT inhaler Inhale 2 puffs into the lungs 2 (two) times daily. 1 each 3    diltiazem (CARDIZEM CD) 300 MG 24 hr capsule Take 1 capsule (300 mg total) by mouth daily. 30 capsule 3    doxepin (SINEQUAN) 50 MG capsule Take 1 capsule (50 mg total) by mouth at bedtime. 30 capsule 1    latanoprost (XALATAN) 0.005 % ophthalmic solution Place 1 drop into both eyes at bedtime. 2.5 mL 1    mirtazapine (REMERON) 30 MG tablet Take 1 tablet (30 mg total) by mouth at bedtime. 30 tablet 3     Patient Stressors: Medication change or noncompliance   Substance abuse   Other: depression    Patient Strengths: Ability  for insight  Capable of independent living  Physical Health   Treatment Modalities: Medication Management, Group therapy, Case management,  1 to 1 session with clinician, Psychoeducation, Recreational therapy.   Physician Treatment Plan for Primary Diagnosis: MDD (major depressive disorder), recurrent severe, without psychosis (HCC) Long Term Goal(s): Improvement in symptoms so as ready for discharge   Short Term Goals: Ability to identify changes in lifestyle to reduce recurrence of condition will improve Ability to verbalize feelings will improve Ability to disclose and discuss suicidal ideas Ability to demonstrate self-control will improve Ability to identify and develop effective coping behaviors will improve Ability to maintain clinical measurements within normal limits will improve Compliance  with prescribed medications will improve Ability to identify triggers associated with substance abuse/mental health issues will improve  Medication Management: Evaluate patient's response, side effects, and tolerance of medication regimen.  Therapeutic Interventions: 1 to 1 sessions, Unit Group sessions and Medication administration.  Evaluation of Outcomes: Progressing  Physician Treatment Plan for Secondary Diagnosis: Principal Problem:   MDD (major depressive disorder), recurrent severe, without psychosis (HCC)  Long Term Goal(s): Improvement in symptoms so as ready for discharge   Short Term Goals: Ability to identify changes in lifestyle to reduce recurrence of condition will improve Ability to verbalize feelings will improve Ability to disclose and discuss suicidal ideas Ability to demonstrate self-control will improve Ability to identify and develop effective coping behaviors will improve Ability to maintain clinical measurements within normal limits will improve Compliance with prescribed medications will improve Ability to identify triggers associated with substance abuse/mental health issues will improve     Medication Management: Evaluate patient's response, side effects, and tolerance of medication regimen.  Therapeutic Interventions: 1 to 1 sessions, Unit Group sessions and Medication administration.  Evaluation of Outcomes: Progressing   RN Treatment Plan for Primary Diagnosis: MDD (major depressive disorder), recurrent severe, without psychosis (HCC) Long Term Goal(s): Knowledge of disease and therapeutic regimen to maintain health will improve  Short Term Goals: Ability to demonstrate self-control, Ability to participate in decision making will improve, Ability to verbalize feelings will improve, Ability to disclose and discuss suicidal ideas, Ability to identify and develop effective coping behaviors will improve, and Compliance with prescribed medications will  improve  Medication Management: RN will administer medications as ordered by provider, will assess and evaluate patient's response and provide education to patient for prescribed medication. RN will report any adverse and/or side effects to prescribing provider.  Therapeutic Interventions: 1 on 1 counseling sessions, Psychoeducation, Medication administration, Evaluate responses to treatment, Monitor vital signs and CBGs as ordered, Perform/monitor CIWA, COWS, AIMS and Fall Risk screenings as ordered, Perform wound care treatments as ordered.  Evaluation of Outcomes: Progressing   LCSW Treatment Plan for Primary Diagnosis: MDD (major depressive disorder), recurrent severe, without psychosis (HCC) Long Term Goal(s): Safe transition to appropriate next level of care at discharge, Engage patient in therapeutic group addressing interpersonal concerns.  Short Term Goals: Engage patient in aftercare planning with referrals and resources, Increase social support, Increase ability to appropriately verbalize feelings, Increase emotional regulation, Facilitate acceptance of mental health diagnosis and concerns, Facilitate patient progression through stages of change regarding substance use diagnoses and concerns, Identify triggers associated with mental health/substance abuse issues, and Increase skills for wellness and recovery  Therapeutic Interventions: Assess for all discharge needs, 1 to 1 time with Social worker, Explore available resources and support systems, Assess for adequacy in community support network, Educate family and significant other(s) on suicide prevention, Complete Psychosocial  Assessment, Interpersonal group therapy.  Evaluation of Outcomes: Progressing   Progress in Treatment: Attending groups: Yes. Participating in groups: Yes. Taking medication as prescribed: Yes. Toleration medication: Yes. Family/Significant other contact made: No, will contact:  once permission is  given. Patient understands diagnosis: Yes. Discussing patient identified problems/goals with staff: Yes. Medical problems stabilized or resolved: Yes. Denies suicidal/homicidal ideation: Yes. Issues/concerns per patient self-inventory: No. Other: none  New problem(s) identified: No, Describe:  none  Update 04/11/2023:  No changes at this time.    New Short Term/Long Term Goal(s): detox, medication management for mood stabilization; elimination of SI thoughts; development of comprehensive mental wellness/sobriety plan. Update 04/11/2023:  No changes at this time.    Patient Goals:  "get better" Update 04/11/2023:  No changes at this time.    Discharge Plan or Barriers: CSW to assist in development of appropriate discharge plans. Update 04/11/2023:  Patient remains on the unit and safe at this time.  Patient reports plans to return home and begin with treatment services.    Reason for Continuation of Hospitalization: Anxiety Depression Medication stabilization Suicidal ideation   Estimated Length of Stay:  1-7 days Update 04/11/2023:  No changes at this time.   Last 3 [place] Suicide Severity Risk Score: Flowsheet Row Admission (Current) from 04/06/2023 in [facility] ED from 04/05/2023 in [facility] Emergency Department at [facility] Admission (Discharged) from 01/10/2023 in [facility] BE[facility]  C-SSRS RISK CATEGORY High Risk High Risk High Risk       Last PHQ 2/9 Scores:    10/19/2022    2:53 PM 05/11/2022    2:30 PM 03/31/2022    2:15 PM  Depression screen PHQ 2/9  Decreased Interest 0 1 0  Down, Depressed, Hopeless 0 1 0  PHQ - 2 Score 0 2 0  Altered sleeping  0   Tired, decreased energy  1   Change in appetite  1   Trouble concentrating  1   Moving slowly or fidgety/restless  0   Suicidal thoughts  0   PHQ-9 Score  5     Scribe for Treatment Team: Harden Mo, LCSW 04/11/2023 11:30 AM

## 2023-04-12 DIAGNOSIS — F332 Major depressive disorder, recurrent severe without psychotic features: Secondary | ICD-10-CM | POA: Diagnosis not present

## 2023-04-12 MED ORDER — MIRTAZAPINE 15 MG PO TABS
45.0000 mg | ORAL_TABLET | Freq: Every day | ORAL | Status: DC
  Administered 2023-04-12 – 2023-04-17 (×6): 45 mg via ORAL
  Filled 2023-04-12 (×7): qty 3

## 2023-04-12 NOTE — Progress Notes (Signed)
Patient denies SI, HI, and AVH. Affect is flat and depressed. He denies pain and other physical concerns. He is compliant with scheduled medications and remains safe on the unit at this time.

## 2023-04-12 NOTE — Progress Notes (Signed)
[facility] MD Progress Note   04/12/2023 4:00 PM Robert Chan  MRN:  [id number] Subjective: Patient seen.  Still feeling depressed but perhaps slightly better.  Stays isolated some of the time but at other times is out interacting with others.  No new complaints today. Principal Problem: MDD (major depressive disorder), recurrent severe, without psychosis (HCC) Diagnosis: Principal Problem:   MDD (major depressive disorder), recurrent severe, without psychosis (HCC)  Total Time spent with patient: 30 minutes  Past Psychiatric History: Past history of depression multiple medical issues  Past Medical History:  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   Depression    Elevated serum creatinine 08/28/2019   Homelessness 06/19/2020   Hypertension    NSTEMI (non-ST elevated myocardial infarction) (HCC)    Status post incision and drainage 04/22/2021   Suicidal ideation 06/05/2020    Past Surgical History:  Procedure Laterality Date   APPENDECTOMY     EYE SURGERY     LEFT HEART CATH AND CORONARY ANGIOGRAPHY N/A 06/05/2020   Procedure: LEFT HEART CATH AND CORONARY ANGIOGRAPHY;  Surgeon: Elder Negus, MD;  Location: [facility];  Service: Cardiovascular;  Laterality: N/A;   PROSTATE SURGERY     Family History:  Family History  Problem Relation Age of Onset   Hypertension Mother    Diabetes Mother    Hypertension Father    Hypertension Sister    Family Psychiatric  History: See previous Social History:  Social History   Substance and Sexual Activity  Alcohol Use Yes   Comment: drinks 2x/wk, 2-3 40 oz drinks, last drink 2 weeks ago     Social History   Substance and Sexual Activity  Drug Use  Not Currently   Types: Cocaine   Comment: cocaine use once a month, last use 6 mos ago    Social History   Socioeconomic History   Marital status: Single    Spouse name: Not on file   Number of children: Not on file   Years of education: Not on file   Highest education level: Not on file  Occupational History   Not on file  Tobacco Use   Smoking status: Some Days    Types: Cigarettes    Last attempt to quit: 12/25/2020    Years since quitting: 2.2   Smokeless tobacco: Never   Tobacco comments:    Smokes a couple cigarettes every now and then  Vaping Use   Vaping Use: Never used  Substance and Sexual Activity   Alcohol use: Yes    Comment: drinks 2x/wk, 2-3 40 oz drinks, last drink 2 weeks ago   Drug use: Not Currently    Types: Cocaine    Comment: cocaine use once a month, last use 6 mos ago   Sexual activity: Not Currently  Other Topics Concern   Not on file  Social History Narrative   His mother passed at age 22 (when he was 78 years old) and his father raised him and his siblings   His parents were pastors as a lot of his sisters and other family members are today   There was a total of 13 children in his family  Had 9 sisters, one sister deceased , Had 3 brothers, all brothers deceased   He is the youngest of the 47 and was "always in trouble in my younger years"   Family still live in [place], [place], some in [place], [place]   Has a Daughter and son with a total of 10 grandchildren (6 grand daughters local and 4 grandsons)    Values family   Married twice, present wife incarcerated   Social Determinants of Health   Financial Resource Strain: Low Risk  (10/19/2022)   Overall Financial Resource Strain (CARDIA)    Difficulty of Paying Living Expenses: Not hard at all  Food Insecurity: No Food Insecurity (04/06/2023)   Hunger Vital Sign    Worried About Running Out of Food in the Last Year: Never true    Ran Out of Food in the Last Year: Never true  Transportation  Needs: No Transportation Needs (04/06/2023)   PRAPARE - Administrator, Civil Service (Medical): No    Lack of Transportation (Non-Medical): No  Physical Activity: Inactive (10/19/2022)   Exercise Vital Sign    Days of Exercise per Week: 0 days    Minutes of Exercise per Session: 0 min  Stress: No Stress Concern Present (10/19/2022)   [name] of Occupational Health - Occupational Stress Questionnaire    Feeling of Stress : Not at all  Social Connections: Moderately Integrated (03/31/2022)   Social Connection and Isolation Panel [NHANES]    Frequency of Communication with Friends and Family: Three times a week    Frequency of Social Gatherings with Friends and Family: Three times a week    Attends Religious Services: 1 to 4 times per year    Active Member of Clubs or Organizations: Yes    Attends Banker Meetings: 1 to 4 times per year    Marital Status: Never married   Additional Social History:                         Sleep: Fair  Appetite:  Fair  Current Medications: Current Facility-Administered Medications  Medication Dose Route Frequency Provider Last Rate Last Admin   acetaminophen (TYLENOL) tablet 650 mg  650 mg Oral Q6H PRN [name], [name], NP       albuterol (VENTOLIN HFA) 108 (90 Base) MCG/ACT inhaler 2 puff  2 puff Inhalation Q6H PRN Philip Eckersley T, MD       alum & mag hydroxide-simeth (MAALOX/MYLANTA) 200-200-20 MG/5ML suspension 30 mL  30 mL Oral Q4H PRN [name], [name], NP       ARIPiprazole (ABILIFY) tablet 10 mg  10 mg Oral QPC breakfast [name], DO   10 mg at 04/12/23 0915   atorvastatin (LIPITOR) tablet 80 mg  80 mg Oral Daily [name], DO   80 mg at 04/12/23 0915   diltiazem (CARDIZEM CD) 24 hr capsule 300 mg  300 mg Oral Daily [name], DO   300 mg at 04/12/23 0915   doxepin (SINEQUAN) capsule 50 mg  50 mg Oral QHS [name], DO   50 mg at 04/11/23 2121    hydrOXYzine (ATARAX) tablet 25 mg  25 mg Oral TID PRN [name], [name], NP       latanoprost (XALATAN) 0.005 % ophthalmic solution 1 drop  1 drop Both Eyes QHS Marielena Harvell T, MD   1 drop at 04/11/23 2124   LORazepam (ATIVAN) injection 2 mg  2 mg Intramuscular TID PRN [name], [name], NP       LORazepam (ATIVAN) tablet 1 mg  1 mg Oral Q4H PRN [name], DO       magnesium hydroxide (MILK OF MAGNESIA) suspension 30 mL  30 mL Oral Daily PRN [name], [name], NP       mirtazapine (REMERON) tablet 45 mg  45 mg Oral QHS Kaylum Shrum T, MD       mometasone-formoterol [facility]) 200-5 MCG/ACT inhaler 2 puff  2 puff Inhalation BID Malik, Kashif   2 puff at 04/12/23 0915   OLANZapine (ZYPREXA) tablet 10 mg  10 mg Oral Q6H PRN [name], DO       traZODone (DESYREL) tablet 50 mg  50 mg Oral QHS PRN [name], [name], NP   50 mg at 04/08/23 2121    Lab Results: No results found for this or any previous visit (from the past 48 hour(s)).  Blood Alcohol level:  Lab Results  Component Value Date   ETH <10 04/05/2023   ETH <10 01/10/2023    Metabolic Disorder Labs: Lab Results  Component Value Date   HGBA1C 5.2 01/13/2023   MPG 103 01/13/2023   MPG 91.06 09/07/2020   No results found for: "PROLACTIN" Lab Results  Component Value Date   CHOL 168 01/13/2023   TRIG 62 01/13/2023   HDL 57 01/13/2023   CHOLHDL 2.9 01/13/2023   VLDL 12 01/13/2023   LDLCALC 99 01/13/2023   LDLCALC 104 (H) 05/17/2022    Physical Findings: AIMS:  , ,  ,  ,    CIWA:    COWS:     Musculoskeletal: Strength & Muscle Tone: within normal limits Gait & Station: normal Patient leans: N/A  Psychiatric Specialty Exam:  Presentation  General Appearance:  Appropriate for Environment; Casual  Eye Contact: Fair  Speech: Normal Rate  Speech Volume: Decreased  Handedness: Right   Mood and Affect  Mood: Depressed  Affect: Appropriate; Congruent   Thought  Process  Thought Processes: Goal Directed; Coherent  Descriptions of Associations:Intact  Orientation:Full (Time, Place and Person)  Thought Content:Logical  History of Schizophrenia/Schizoaffective disorder:No  Duration of Psychotic Symptoms:Less than six months  Hallucinations:No data recorded Ideas of Reference:None  Suicidal Thoughts:No data recorded Homicidal Thoughts:No data recorded  Sensorium  Memory: Immediate Fair; Remote Fair  Judgment: Fair  Insight: Fair   Art therapist  Concentration: Fair  Attention Span: Fair  Recall: [employer] of Knowledge: Fair  Language: Fair   Psychomotor Activity  Psychomotor Activity:No data recorded  Assets  Assets: Communication Skills; Desire for Improvement; Financial Resources/Insurance; Housing   Sleep  Sleep:No data recorded   Physical Exam: Physical Exam Vitals and nursing note reviewed.  Constitutional:      Appearance: Normal appearance.  HENT:     Head: Normocephalic and atraumatic.     Mouth/Throat:     Pharynx: Oropharynx is clear.  Eyes:     Pupils: Pupils are equal, round, and reactive to light.  Cardiovascular:     Rate and Rhythm: Normal rate and regular rhythm.  Pulmonary:     Effort: Pulmonary effort is normal.     Breath sounds: Normal breath sounds.  Abdominal:     General: Abdomen is flat.     Palpations: Abdomen is soft.  Musculoskeletal:        General: Normal range of motion.  Skin:    General: Skin is warm and dry.  Neurological:  General: No focal deficit present.     Mental Status: He is alert. Mental status is at baseline.  Psychiatric:        Mood and Affect: Mood normal.        Thought Content: Thought content normal.    Review of Systems  Constitutional: Negative.   HENT: Negative.    Eyes: Negative.   Respiratory: Negative.    Cardiovascular: Negative.   Gastrointestinal: Negative.   Musculoskeletal: Negative.   Skin: Negative.    Neurological: Negative.   Psychiatric/Behavioral:  Positive for depression.    Blood pressure 139/70, pulse 72, temperature 97.9 F (36.6 C), resp. rate 17, height 6\' 4"  (1.93 m), weight 113.8 kg, SpO2 97 %. Body mass index is 30.54 kg/m.   Treatment Plan Summary: Medication management and Plan made a change in some of his medicine yesterday.  Seems to be tolerating it okay.  No change to medicine today encourage patient to be more active on the ward.  Mordecai Rasmussen, MD 04/12/2023, 4:00 PM

## 2023-04-12 NOTE — Plan of Care (Signed)
Calm and cooperative. Reports a little depression but doing much better. Visible on the unit. Attends group. Po med compliant. No behavior issues noted. Q 15 min checks maintained for safety. Denies SI/HI/AV/H. Plan of care continued.   Problem: Activity: Goal: Risk for activity intolerance will decrease Outcome: Progressing   Problem: Nutrition: Goal: Adequate nutrition will be maintained Outcome: Progressing   Problem: Coping: Goal: Level of anxiety will decrease Outcome: Progressing   Problem: Elimination: Goal: Will not experience complications related to bowel motility Outcome: Progressing Goal: Will not experience complications related to urinary retention Outcome: Progressing   Problem: Pain Managment: Goal: General experience of comfort will improve Outcome: Progressing   Problem: Safety: Goal: Ability to remain free from injury will improve Outcome: Progressing

## 2023-04-12 NOTE — [facility] Group Notes (Signed)
[facility] Group Notes:  (Nursing/MHT/Case Management/Adjunct)  Date:  04/12/2023  Time:  8:30 AM  Type of Therapy:  Music Therapy  Participation Level:  Active  Participation Quality:  Appropriate  Affect:  Appropriate  Cognitive:  Appropriate  Insight:  Appropriate  Engagement in Group:  Engaged  Modes of Intervention:  Activity  Summary of Progress/Problems:  Robert Chan 04/12/2023, 8:30 AM

## 2023-04-12 NOTE — [facility] Group Notes (Signed)
[facility] Group Notes:  (Nursing/MHT/Case Management/Adjunct)  Date:  04/12/2023  Time:  11:05 AM  Type of Therapy: Outside Recreation   Participation Level:  Active  Participation Quality:  Appropriate  Affect:  Appropriate  Cognitive:  Alert and Appropriate  Insight:  Appropriate  Engagement in Group:  Engaged  Modes of Intervention:  Activity  Summary of Progress/Problems:09:25-10:30  Robert Chan Lurlean Nanny 04/12/2023, 11:05 AM

## 2023-04-12 NOTE — Group Note (Signed)
Date:  04/12/2023 Time:  8:46 PM  Group Topic/Focus:  Emotional Education:   The focus of this group is to discuss what feelings/emotions are, and how they are experienced.    Participation Level:  Active  Participation Quality:  Supportive  Affect:  Appropriate  Cognitive:  Alert  Insight: Appropriate  Engagement in Group:  Engaged  Modes of Intervention:  Discussion  Additional Comments:    Burt Ek 04/12/2023, 8:46 PM

## 2023-04-13 DIAGNOSIS — F332 Major depressive disorder, recurrent severe without psychotic features: Secondary | ICD-10-CM | POA: Diagnosis not present

## 2023-04-13 NOTE — Group Note (Signed)
Date:  04/13/2023 Time:  9:04 PM  Group Topic/Focus:  Goals Group:   The focus of this group is to help patients establish daily goals to achieve during treatment and discuss how the patient can incorporate goal setting into their daily lives to aide in recovery.    Participation Level:  Active  Participation Quality:  Appropriate  Affect:  Appropriate  Cognitive:  Appropriate  Insight: Appropriate  Engagement in Group:  Engaged  Modes of Intervention:  Clarification  Additional Comments:    Jeannette How 04/13/2023, 9:04 PM

## 2023-04-13 NOTE — Plan of Care (Signed)
Pleasant and cooperative. States he's ok. Interacting with peers. Attends group. Po med compliant. No PRNs given. No behavior issues noted. Denies SI/HI/AV/H. No c/o pain/discomfort noted. Q 15 min checks maintained for safety.   Problem: Activity: Goal: Risk for activity intolerance will decrease Outcome: Progressing   Problem: Nutrition: Goal: Adequate nutrition will be maintained Outcome: Progressing   Problem: Coping: Goal: Level of anxiety will decrease Outcome: Progressing   Problem: Elimination: Goal: Will not experience complications related to bowel motility Outcome: Progressing Goal: Will not experience complications related to urinary retention Outcome: Progressing   Problem: Pain Managment: Goal: General experience of comfort will improve Outcome: Progressing   Problem: Safety: Goal: Ability to remain free from injury will improve Outcome: Progressing

## 2023-04-13 NOTE — [facility] Group Notes (Signed)
[facility] Group Notes:  (Nursing/MHT/Case Management/Adjunct)  Date:  04/13/2023  Time:  10:50 AM  Type of Therapy: Outside Recreation   Participation Level:  Active  Participation Quality:  Appropriate  Affect:  Appropriate  Cognitive:  Alert and Appropriate  Insight:  Appropriate  Engagement in Group:  Engaged  Modes of Intervention:  Activity  Summary of Progress/Problems:  Marta Antu 04/13/2023, 10:50 AM

## 2023-04-13 NOTE — Progress Notes (Signed)
[facility] MD Progress Note  04/13/2023 6:05 PM Robert Chan  MRN:  [id number] Subjective: Follow-up 67 year old man with depression.  Reports mood is better.  No active suicidal thoughts.  Participating more getting out of his room more. Principal Problem: MDD (major depressive disorder), recurrent severe, without psychosis (HCC) Diagnosis: Principal Problem:   MDD (major depressive disorder), recurrent severe, without psychosis (HCC)  Total Time spent with patient: 30 minutes  Past Psychiatric History: Past history of depression  Past Medical History:  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   Depression    Elevated serum creatinine 08/28/2019   Homelessness 06/19/2020   Hypertension    NSTEMI (non-ST elevated myocardial infarction) [facility])    Status post incision and drainage 04/22/2021   Suicidal ideation 06/05/2020    Past Surgical History:  Procedure Laterality Date   APPENDECTOMY     EYE SURGERY     LEFT HEART CATH AND CORONARY ANGIOGRAPHY N/A 06/05/2020   Procedure: LEFT HEART CATH AND CORONARY ANGIOGRAPHY;  Surgeon: Elder Negus, MD;  Location: [facility] INVASIVE CV LAB;  Service: Cardiovascular;  Laterality: N/A;   PROSTATE SURGERY     Family History:  Family History  Problem Relation Age of Onset   Hypertension Mother    Diabetes Mother    Hypertension Father    Hypertension Sister    Family Psychiatric  History: See previous Social History:  Social History   Substance and Sexual Activity  Alcohol Use Yes   Comment: drinks 2x/wk, 2-3 40 oz drinks, last drink 2 weeks ago     Social History   Substance and Sexual Activity  Drug Use Not Currently   Types: Cocaine   Comment: cocaine use  once a month, last use 6 mos ago    Social History   Socioeconomic History   Marital status: Single    Spouse name: Not on file   Number of children: Not on file   Years of education: Not on file   Highest education level: Not on file  Occupational History   Not on file  Tobacco Use   Smoking status: Some Days    Types: Cigarettes    Last attempt to quit: 12/25/2020    Years since quitting: 2.2   Smokeless tobacco: Never   Tobacco comments:    Smokes a couple cigarettes every now and then  Vaping Use   Vaping Use: Never used  Substance and Sexual Activity   Alcohol use: Yes    Comment: drinks 2x/wk, 2-3 40 oz drinks, last drink 2 weeks ago   Drug use: Not Currently    Types: Cocaine    Comment: cocaine use once a month, last use 6 mos ago   Sexual activity: Not Currently  Other Topics Concern   Not on file  Social History Narrative   His mother passed at age 37 (when he was 2 years old) and his father raised him and his siblings   His parents were pastors as a lot of his sisters and other family members are today   There was a total of 13 children in his family Had 71 sisters, one sister deceased , Had 3 brothers, all  brothers deceased   He is the youngest of the 53 and was "always in trouble in my younger years"   Family still live in [place], [place], some in [place], [place]   Has a Daughter and son with a total of 10 grandchildren (6 grand daughters local and 4 grandsons)    Values family   Married twice, present wife incarcerated   Social Determinants of Health   Financial Resource Strain: Low Risk  (10/19/2022)   Overall Financial Resource Strain (CARDIA)    Difficulty of Paying Living Expenses: Not hard at all  Food Insecurity: No Food Insecurity (04/06/2023)   Hunger Vital Sign    Worried About Running Out of Food in the Last Year: Never true    Ran Out of Food in the Last Year: Never true  Transportation Needs: No Transportation Needs (04/06/2023)   PRAPARE -  Administrator, Civil Service (Medical): No    Lack of Transportation (Non-Medical): No  Physical Activity: Inactive (10/19/2022)   Exercise Vital Sign    Days of Exercise per Week: 0 days    Minutes of Exercise per Session: 0 min  Stress: No Stress Concern Present (10/19/2022)   [name] of Occupational Health - Occupational Stress Questionnaire    Feeling of Stress : Not at all  Social Connections: Moderately Integrated (03/31/2022)   Social Connection and Isolation Panel [NHANES]    Frequency of Communication with Friends and Family: Three times a week    Frequency of Social Gatherings with Friends and Family: Three times a week    Attends Religious Services: 1 to 4 times per year    Active Member of Clubs or Organizations: Yes    Attends Banker Meetings: 1 to 4 times per year    Marital Status: Never married   Additional Social History:                         Sleep: Fair  Appetite:  Fair  Current Medications: Current Facility-Administered Medications  Medication Dose Route Frequency Provider Last Rate Last Admin   acetaminophen (TYLENOL) tablet 650 mg  650 mg Oral Q6H PRN [name], [name], NP       albuterol (VENTOLIN HFA) 108 (90 Base) MCG/ACT inhaler 2 puff  2 puff Inhalation Q6H PRN Adynn Caseres T, MD       alum & mag hydroxide-simeth (MAALOX/MYLANTA) 200-200-20 MG/5ML suspension 30 mL  30 mL Oral Q4H PRN [name], [name], NP       ARIPiprazole (ABILIFY) tablet 10 mg  10 mg Oral QPC breakfast [name], DO   10 mg at 04/13/23 0928   atorvastatin (LIPITOR) tablet 80 mg  80 mg Oral Daily [name], DO   80 mg at 04/13/23 1610   diltiazem (CARDIZEM CD) 24 hr capsule 300 mg  300 mg Oral Daily [name], DO   300 mg at 04/13/23 9604   doxepin (SINEQUAN) capsule 50 mg  50 mg Oral QHS [name], DO   50 mg at 04/12/23 2109   hydrOXYzine (ATARAX) tablet 25 mg  25 mg Oral TID  PRN [name], [name], NP       latanoprost (XALATAN) 0.005 % ophthalmic solution 1 drop  1 drop Both Eyes QHS Scottie Stanish T, MD   1 drop at 04/12/23 2111   LORazepam (ATIVAN) injection 2 mg  2 mg Intramuscular TID PRN Mancel Bale, NP  LORazepam (ATIVAN) tablet 1 mg  1 mg Oral Q4H PRN [name], DO       magnesium hydroxide (MILK OF MAGNESIA) suspension 30 mL  30 mL Oral Daily PRN [name], [name], NP       mirtazapine (REMERON) tablet 45 mg  45 mg Oral QHS Lilas Diefendorf T, MD   45 mg at 04/12/23 2110   mometasone-formoterol (DULERA) 200-5 MCG/ACT inhaler 2 puff  2 puff Inhalation BID Malik, Kashif   2 puff at 04/13/23 0928   OLANZapine (ZYPREXA) tablet 10 mg  10 mg Oral Q6H PRN [name], DO       traZODone (DESYREL) tablet 50 mg  50 mg Oral QHS PRN [name], [name], NP   50 mg at 04/08/23 2121    Lab Results: No results found for this or any previous visit (from the past 48 hour(s)).  Blood Alcohol level:  Lab Results  Component Value Date   ETH <10 04/05/2023   ETH <10 01/10/2023    Metabolic Disorder Labs: Lab Results  Component Value Date   HGBA1C 5.2 01/13/2023   MPG 103 01/13/2023   MPG 91.06 09/07/2020   No results found for: "PROLACTIN" Lab Results  Component Value Date   CHOL 168 01/13/2023   TRIG 62 01/13/2023   HDL 57 01/13/2023   CHOLHDL 2.9 01/13/2023   VLDL 12 01/13/2023   LDLCALC 99 01/13/2023   LDLCALC 104 (H) 05/17/2022    Physical Findings: AIMS:  , ,  ,  ,    CIWA:    COWS:     Musculoskeletal: Strength & Muscle Tone: within normal limits Gait & Station: normal Patient leans: N/A  Psychiatric Specialty Exam:  Presentation  General Appearance:  Appropriate for Environment; Casual  Eye Contact: Fair  Speech: Normal Rate  Speech Volume: Decreased  Handedness: Right   Mood and Affect  Mood: Depressed  Affect: Appropriate; Congruent   Thought Process  Thought Processes: Goal  Directed; Coherent  Descriptions of Associations:Intact  Orientation:Full (Time, Place and Person)  Thought Content:Logical  History of Schizophrenia/Schizoaffective disorder:No  Duration of Psychotic Symptoms:Less than six months  Hallucinations:No data recorded Ideas of Reference:None  Suicidal Thoughts:No data recorded Homicidal Thoughts:No data recorded  Sensorium  Memory: Immediate Fair; Remote Fair  Judgment: Fair  Insight: Fair   Art therapist  Concentration: Fair  Attention Span: Fair  Recall: [employer] of Knowledge: Fair  Language: Fair   Psychomotor Activity  Psychomotor Activity:No data recorded  Assets  Assets: Communication Skills; Desire for Improvement; Financial Resources/Insurance; Housing   Sleep  Sleep:No data recorded   Physical Exam: Physical Exam Vitals reviewed.  Constitutional:      Appearance: Normal appearance.  HENT:     Head: Normocephalic and atraumatic.     Mouth/Throat:     Pharynx: Oropharynx is clear.  Eyes:     Pupils: Pupils are equal, round, and reactive to light.  Cardiovascular:     Rate and Rhythm: Normal rate and regular rhythm.  Pulmonary:     Effort: Pulmonary effort is normal.     Breath sounds: Normal breath sounds.  Abdominal:     General: Abdomen is flat.     Palpations: Abdomen is soft.  Musculoskeletal:        General: Normal range of motion.  Skin:    General: Skin is warm and dry.  Neurological:     General: No focal deficit present.     Mental Status: He is  alert. Mental status is at baseline.  Psychiatric:        Attention and Perception: Attention normal.        Mood and Affect: Mood normal.        Speech: Speech normal.        Behavior: Behavior normal.        Thought Content: Thought content normal.        Cognition and Memory: Cognition normal.    Review of Systems  Constitutional: Negative.   HENT: Negative.    Eyes: Negative.   Respiratory: Negative.     Cardiovascular: Negative.   Gastrointestinal: Negative.   Musculoskeletal: Negative.   Skin: Negative.   Neurological: Negative.   Psychiatric/Behavioral: Negative.     Blood pressure 119/71, pulse 69, temperature 98.2 F (36.8 C), resp. rate 15, height 6\' 4"  (1.93 m), weight 113.8 kg, SpO2 98 %. Body mass index is 30.54 kg/m.   Treatment Plan Summary: Plan no change to medication or treatment plan.  Hope the patient continues to improve look towards discharge in the future.  Mordecai Rasmussen, MD 04/13/2023, 6:06 PM

## 2023-04-13 NOTE — Progress Notes (Signed)
Patient denies SI, HI, and AVH. He denies pain and other physical concerns. He interacts minimally with others, but did attend group outside this morning and comes out for meals and snacks. Patient is compliant with scheduled medications and remains safe on the unit at this time.

## 2023-04-14 DIAGNOSIS — F332 Major depressive disorder, recurrent severe without psychotic features: Secondary | ICD-10-CM | POA: Diagnosis not present

## 2023-04-14 NOTE — Progress Notes (Signed)
Patient pleasant and cooperative.  Present in the dayroom for breakfast.  Patient reports he did not sleep well.  Good appetite this morning.  Sad affect. Endorses depression.  Denies SI and HI.   Denies pain.  Compliant with scheduled mediations.  15 min checks in place for safety.  Patient did not attend NT group - isolated to room.  Appropriate interaction with peers when in milieu.

## 2023-04-14 NOTE — Progress Notes (Signed)
[facility] MD Progress Note  04/14/2023 2:39 PM Robert Chan  MRN:  [id number] Subjective: Follow-up patient with depression.  Continues to feel depressed and down no acute suicidal threats but still somewhat hopeless.  Feeling sedated from medicine but no falls reported and he is up out of bed neatly dressed interacting during the day. Principal Problem: MDD (major depressive disorder), recurrent severe, without psychosis (HCC) Diagnosis: Principal Problem:   MDD (major depressive disorder), recurrent severe, without psychosis (HCC)  Total Time spent with patient: 30 minutes  Past Psychiatric History: Past history of depression  Past Medical History:  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   Depression    Elevated serum creatinine 08/28/2019   Homelessness 06/19/2020   Hypertension    NSTEMI (non-ST elevated myocardial infarction) [facility])    Status post incision and drainage 04/22/2021   Suicidal ideation 06/05/2020    Past Surgical History:  Procedure Laterality Date   APPENDECTOMY     EYE SURGERY     LEFT HEART CATH AND CORONARY ANGIOGRAPHY N/A 06/05/2020   Procedure: LEFT HEART CATH AND CORONARY ANGIOGRAPHY;  Surgeon: Elder Negus, MD;  Location: [facility] INVASIVE CV LAB;  Service: Cardiovascular;  Laterality: N/A;   PROSTATE SURGERY     Family History:  Family History  Problem Relation Age of Onset   Hypertension Mother    Diabetes Mother    Hypertension Father    Hypertension Sister    Family Psychiatric  History: See previous Social History:  Social History   Substance and Sexual Activity  Alcohol Use Yes   Comment: drinks 2x/wk, 2-3 40 oz drinks, last drink 2 weeks ago     Social History    Substance and Sexual Activity  Drug Use Not Currently   Types: Cocaine   Comment: cocaine use once a month, last use 6 mos ago    Social History   Socioeconomic History   Marital status: Single    Spouse name: Not on file   Number of children: Not on file   Years of education: Not on file   Highest education level: Not on file  Occupational History   Not on file  Tobacco Use   Smoking status: Some Days    Types: Cigarettes    Last attempt to quit: 12/25/2020    Years since quitting: 2.3   Smokeless tobacco: Never   Tobacco comments:    Smokes a couple cigarettes every now and then  Vaping Use   Vaping Use: Never used  Substance and Sexual Activity   Alcohol use: Yes    Comment: drinks 2x/wk, 2-3 40 oz drinks, last drink 2 weeks ago   Drug use: Not Currently    Types: Cocaine    Comment: cocaine use once a month, last use 6 mos ago   Sexual activity: Not Currently  Other Topics Concern   Not on file  Social History Narrative   His mother passed at age 71 (when he was 57 years old) and his father raised him and his siblings   His parents were pastors as a lot of his sisters and other family members are today   There was a  total of 13 children in his family Had 69 sisters, one sister deceased , Had 3 brothers, all brothers deceased   He is the youngest of the 36 and was "always in trouble in my younger years"   Family still live in [place], [place], some in [place], [place]   Has a Daughter and son with a total of 10 grandchildren (6 grand daughters local and 4 grandsons)    Values family   Married twice, present wife incarcerated   Social Determinants of Health   Financial Resource Strain: Low Risk  (10/19/2022)   Overall Financial Resource Strain (CARDIA)    Difficulty of Paying Living Expenses: Not hard at all  Food Insecurity: No Food Insecurity (04/06/2023)   Hunger Vital Sign    Worried About Running Out of Food in the Last Year: Never true    Ran Out of Food  in the Last Year: Never true  Transportation Needs: No Transportation Needs (04/06/2023)   PRAPARE - Administrator, Civil Service (Medical): No    Lack of Transportation (Non-Medical): No  Physical Activity: Inactive (10/19/2022)   Exercise Vital Sign    Days of Exercise per Week: 0 days    Minutes of Exercise per Session: 0 min  Stress: No Stress Concern Present (10/19/2022)   [name] of Occupational Health - Occupational Stress Questionnaire    Feeling of Stress : Not at all  Social Connections: Moderately Integrated (03/31/2022)   Social Connection and Isolation Panel [NHANES]    Frequency of Communication with Friends and Family: Three times a week    Frequency of Social Gatherings with Friends and Family: Three times a week    Attends Religious Services: 1 to 4 times per year    Active Member of Clubs or Organizations: Yes    Attends Banker Meetings: 1 to 4 times per year    Marital Status: Never married   Additional Social History:                         Sleep: Fair  Appetite:  Fair  Current Medications: Current Facility-Administered Medications  Medication Dose Route Frequency Provider Last Rate Last Admin   acetaminophen (TYLENOL) tablet 650 mg  650 mg Oral Q6H PRN [name], [name], NP       albuterol (VENTOLIN HFA) 108 (90 Base) MCG/ACT inhaler 2 puff  2 puff Inhalation Q6H PRN Lajuan Godbee T, MD       alum & mag hydroxide-simeth (MAALOX/MYLANTA) 200-200-20 MG/5ML suspension 30 mL  30 mL Oral Q4H PRN [name], [name], NP       ARIPiprazole (ABILIFY) tablet 10 mg  10 mg Oral QPC breakfast [name], DO   10 mg at 04/14/23 0910   atorvastatin (LIPITOR) tablet 80 mg  80 mg Oral Daily [name], DO   80 mg at 04/14/23 0910   diltiazem (CARDIZEM CD) 24 hr capsule 300 mg  300 mg Oral Daily [name], DO   300 mg at 04/14/23 0911   doxepin (SINEQUAN) capsule 50 mg  50 mg Oral QHS  [name], DO   50 mg at 04/13/23 2152   hydrOXYzine (ATARAX) tablet 25 mg  25 mg Oral TID PRN [name], [name], NP       latanoprost (XALATAN) 0.005 % ophthalmic solution 1 drop  1 drop Both Eyes QHS Matthewjames Petrasek, Jackquline Denmark, MD   1 drop at 04/13/23 2154  LORazepam (ATIVAN) injection 2 mg  2 mg Intramuscular TID PRN [name], [name], NP       LORazepam (ATIVAN) tablet 1 mg  1 mg Oral Q4H PRN [name], DO       magnesium hydroxide (MILK OF MAGNESIA) suspension 30 mL  30 mL Oral Daily PRN [name], [name], NP       mirtazapine (REMERON) tablet 45 mg  45 mg Oral QHS Aerie Donica T, MD   45 mg at 04/13/23 2152   mometasone-formoterol (DULERA) 200-5 MCG/ACT inhaler 2 puff  2 puff Inhalation BID Malik, Kashif   2 puff at 04/14/23 0910   OLANZapine (ZYPREXA) tablet 10 mg  10 mg Oral Q6H PRN [name], DO       traZODone (DESYREL) tablet 50 mg  50 mg Oral QHS PRN [name], [name], NP   50 mg at 04/13/23 2152    Lab Results: No results found for this or any previous visit (from the past 48 hour(s)).  Blood Alcohol level:  Lab Results  Component Value Date   ETH <10 04/05/2023   ETH <10 01/10/2023    Metabolic Disorder Labs: Lab Results  Component Value Date   HGBA1C 5.2 01/13/2023   MPG 103 01/13/2023   MPG 91.06 09/07/2020   No results found for: "PROLACTIN" Lab Results  Component Value Date   CHOL 168 01/13/2023   TRIG 62 01/13/2023   HDL 57 01/13/2023   CHOLHDL 2.9 01/13/2023   VLDL 12 01/13/2023   LDLCALC 99 01/13/2023   LDLCALC 104 (H) 05/17/2022    Physical Findings: AIMS:  , ,  ,  ,    CIWA:    COWS:     Musculoskeletal: Strength & Muscle Tone: within normal limits Gait & Station: normal Patient leans: N/A  Psychiatric Specialty Exam:  Presentation  General Appearance:  Appropriate for Environment; Casual  Eye Contact: Fair  Speech: Normal Rate  Speech Volume: Decreased  Handedness: Right   Mood and  Affect  Mood: Depressed  Affect: Appropriate; Congruent   Thought Process  Thought Processes: Goal Directed; Coherent  Descriptions of Associations:Intact  Orientation:Full (Time, Place and Person)  Thought Content:Logical  History of Schizophrenia/Schizoaffective disorder:No  Duration of Psychotic Symptoms:Less than six months  Hallucinations:No data recorded Ideas of Reference:None  Suicidal Thoughts:No data recorded Homicidal Thoughts:No data recorded  Sensorium  Memory: Immediate Fair; Remote Fair  Judgment: Fair  Insight: Fair   Art therapist  Concentration: Fair  Attention Span: Fair  Recall: [employer] of Knowledge: Fair  Language: Fair   Psychomotor Activity  Psychomotor Activity:No data recorded  Assets  Assets: Communication Skills; Desire for Improvement; Financial Resources/Insurance; Housing   Sleep  Sleep:No data recorded   Physical Exam: Physical Exam Constitutional:      Appearance: Normal appearance.  HENT:     Head: Normocephalic and atraumatic.     Mouth/Throat:     Pharynx: Oropharynx is clear.  Eyes:     Pupils: Pupils are equal, round, and reactive to light.  Cardiovascular:     Rate and Rhythm: Normal rate and regular rhythm.  Pulmonary:     Effort: Pulmonary effort is normal.     Breath sounds: Normal breath sounds.  Abdominal:     General: Abdomen is flat.     Palpations: Abdomen is soft.  Musculoskeletal:        General: Normal range of motion.  Skin:    General: Skin is warm and dry.  Neurological:     General: No focal deficit present.     Mental Status: He is alert. Mental status is at baseline.  Psychiatric:        Mood and Affect: Mood is depressed.        Thought Content: Thought content normal.    Review of Systems  Constitutional: Negative.   HENT: Negative.    Eyes: Negative.   Respiratory: Negative.    Cardiovascular: Negative.   Gastrointestinal: Negative.    Musculoskeletal: Negative.   Skin: Negative.   Neurological: Negative.   Psychiatric/Behavioral:  Positive for depression.    Blood pressure 137/76, pulse 67, temperature 98 F (36.7 C), temperature source Oral, resp. rate 18, height 6\' 4"  (1.93 m), weight 113.8 kg, SpO2 100 %. Body mass index is 30.54 kg/m.   Treatment Plan Summary: Plan explained that the sedation from Remeron should improved.  No change to medicine.  Hope that we can be looking towards discharge soon  Mordecai Rasmussen, MD 04/14/2023, 2:39 PM

## 2023-04-14 NOTE — Progress Notes (Signed)
Patient resting quietly in bed with eyes closed, Respirations equal and unlabored, skin warm and dry, NAD. Routine safety checks conducted according to facility protocol. Will continue to monitor for safety. 

## 2023-04-14 NOTE — Group Note (Signed)
[facility] LCSW Group Therapy Note   Group Date: 04/14/2023 Start Time: 1300 End Time: 1400   Type of Therapy/Topic:  Group Therapy:  Balance in Life  Participation Level:  Active   Description of Group:    This group will address the concept of balance and how it feels and looks when one is unbalanced. Patients will be encouraged to process areas in their lives that are out of balance, and identify reasons for remaining unbalanced. Facilitators will guide patients utilizing problem- solving interventions to address and correct the stressor making their life unbalanced. Understanding and applying boundaries will be explored and addressed for obtaining  and maintaining a balanced life. Patients will be encouraged to explore ways to assertively make their unbalanced needs known to significant others in their lives, using other group members and facilitator for support and feedback.  Therapeutic Goals: Patient will identify two or more emotions or situations they have that consume much of in their lives. Patient will identify signs/triggers that life has become out of balance:  Patient will identify two ways to set boundaries in order to achieve balance in their lives:  Patient will demonstrate ability to communicate their needs through discussion and/or role plays  Summary of Patient Progress: Patient was present in group.  Patient arrived late to group, however, upon arrival patient was engaged and supportive of other group members. Patient discussed how his grandchildren are his joy.  Patient was clear.  Patient was appropriate.  Patient showed fair insight.   Therapeutic Modalities:   Cognitive Behavioral Therapy Solution-Focused Therapy Assertiveness Training   Harden Mo, LCSW

## 2023-04-14 NOTE — Group Note (Deleted)
[facility] LCSW Group Therapy Note   Group Date: 04/14/2023 Start Time: 1300 End Time: 1400   Type of Therapy/Topic:  Group Therapy:  Balance in Life  Participation Level:  {B[facility] PARTICIPATION [id number]}   Description of Group:    This group will address the concept of balance and how it feels and looks when one is unbalanced. Patients will be encouraged to process areas in their lives that are out of balance, and identify reasons for remaining unbalanced. Facilitators will guide patients utilizing problem- solving interventions to address and correct the stressor making their life unbalanced. Understanding and applying boundaries will be explored and addressed for obtaining  and maintaining a balanced life. Patients will be encouraged to explore ways to assertively make their unbalanced needs known to significant others in their lives, using other group members and facilitator for support and feedback.  Therapeutic Goals: 1. Patient will identify two or more emotions or situations they have that consume much of in their lives. 2. Patient will identify signs/triggers that life has become out of balance:  3. Patient will identify two ways to set boundaries in order to achieve balance in their lives:  4. Patient will demonstrate ability to communicate their needs through discussion and/or role plays  Summary of Patient Progress:    ***    Therapeutic Modalities:   Cognitive Behavioral Therapy Solution-Focused Therapy Assertiveness Training   Harden Mo, LCSW

## 2023-04-14 NOTE — Plan of Care (Signed)

## 2023-04-15 DIAGNOSIS — F332 Major depressive disorder, recurrent severe without psychotic features: Secondary | ICD-10-CM | POA: Diagnosis not present

## 2023-04-15 NOTE — Group Note (Signed)
Date:  04/15/2023 Time:  2:31 AM  Group Topic/Focus:  Wrap-Up Group:   The focus of this group is to help patients review their daily goal of treatment and discuss progress on daily workbooks.    Participation Level:  Active  Participation Quality:  Appropriate  Affect:  Appropriate  Cognitive:  Appropriate  Insight: Good  Engagement in Group:  Engaged  Modes of Intervention:  Discussion  Additional Comments:  Discussed how patients day was.  Robert Chan 04/15/2023, 2:31 AM

## 2023-04-15 NOTE — Plan of Care (Signed)
  Problem: Coping: Goal: Level of anxiety will decrease Outcome: Progressing   Problem: Pain Managment: Goal: General experience of comfort will improve Outcome: Progressing   Problem: Safety: Goal: Ability to remain free from injury will improve Outcome: Progressing  Patient compliant  with Treatment denies SI/HI/A/VH and verbally contracts for safety. Patient visible in Milieu most of the shift. Q 15 minutes safety checks ongoing Patient remains safe.

## 2023-04-15 NOTE — Group Note (Signed)
Date:  04/15/2023 Time:  6:07 PM  Group Topic/Focus:  Music therapy/ outside therapeutic games. Teamwork, social skills     Participation Level:  Did Not Attend  P Doug Sou 04/15/2023, 6:07 PM

## 2023-04-15 NOTE — Plan of Care (Signed)
  Problem: Activity: Goal: Risk for activity intolerance will decrease Outcome: Progressing   Problem: Nutrition: Goal: Adequate nutrition will be maintained Outcome: Progressing   Problem: Coping: Goal: Level of anxiety will decrease Outcome: Progressing   

## 2023-04-15 NOTE — Group Note (Signed)
Date:  04/15/2023 Time:  8:52 PM  Group Topic/Focus:  Identifying Needs:   The focus of this group is to help patients identify their personal needs that have been historically problematic and identify healthy behaviors to address their needs.    Participation Level:  Active  Participation Quality:  Appropriate  Affect:  Appropriate  Cognitive:  Alert  Insight: Good  Engagement in Group:  Engaged  Modes of Intervention:  Education  Additional Comments:    Garry Heater 04/15/2023, 8:52 PM

## 2023-04-15 NOTE — Progress Notes (Signed)
Patient is alert and oriented. He denies anxiety and depression. He denies SI/HI/AVH. Affect and mood sad.   No distress noted. No concerns noted or voiced.   Q15 minute unit checks in place.

## 2023-04-15 NOTE — Progress Notes (Signed)
[facility] MD Progress Note  04/15/2023 6:02 PM Robert Chan  MRN:  [id number] Subjective: Follow-up patient with depression.  He acknowledges that he is feeling better.  Denies suicidal thoughts.  Tiredness less pronounced than yesterday Principal Problem: MDD (major depressive disorder), recurrent severe, without psychosis (HCC) Diagnosis: Principal Problem:   MDD (major depressive disorder), recurrent severe, without psychosis (HCC)  Total Time spent with patient: 30 minutes  Past Psychiatric History: Past history of depression  Past Medical History:  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   Depression    Elevated serum creatinine 08/28/2019   Homelessness 06/19/2020   Hypertension    NSTEMI (non-ST elevated myocardial infarction) [facility])    Status post incision and drainage 04/22/2021   Suicidal ideation 06/05/2020    Past Surgical History:  Procedure Laterality Date   APPENDECTOMY     EYE SURGERY     LEFT HEART CATH AND CORONARY ANGIOGRAPHY N/A 06/05/2020   Procedure: LEFT HEART CATH AND CORONARY ANGIOGRAPHY;  Surgeon: Elder Negus, MD;  Location: [facility] INVASIVE CV LAB;  Service: Cardiovascular;  Laterality: N/A;   PROSTATE SURGERY     Family History:  Family History  Problem Relation Age of Onset   Hypertension Mother    Diabetes Mother    Hypertension Father    Hypertension Sister    Family Psychiatric  History: See previous Social History:  Social History   Substance and Sexual Activity  Alcohol Use Yes   Comment: drinks 2x/wk, 2-3 40 oz drinks, last drink 2 weeks ago     Social History   Substance and Sexual Activity  Drug Use Not Currently   Types: Cocaine   Comment: cocaine use  once a month, last use 6 mos ago    Social History   Socioeconomic History   Marital status: Single    Spouse name: Not on file   Number of children: Not on file   Years of education: Not on file   Highest education level: Not on file  Occupational History   Not on file  Tobacco Use   Smoking status: Some Days    Types: Cigarettes    Last attempt to quit: 12/25/2020    Years since quitting: 2.3   Smokeless tobacco: Never   Tobacco comments:    Smokes a couple cigarettes every now and then  Vaping Use   Vaping Use: Never used  Substance and Sexual Activity   Alcohol use: Yes    Comment: drinks 2x/wk, 2-3 40 oz drinks, last drink 2 weeks ago   Drug use: Not Currently    Types: Cocaine    Comment: cocaine use once a month, last use 6 mos ago   Sexual activity: Not Currently  Other Topics Concern   Not on file  Social History Narrative   His mother passed at age 49 (when he was 27 years old) and his father raised him and his siblings   His parents were pastors as a lot of his sisters and other family members are today   There was a total of 13 children in his family Had 34 sisters, one sister deceased , Had 3 brothers, all brothers deceased  He is the youngest of the 38 and was "always in trouble in my younger years"   Family still live in [place], [place], some in [place], [place]   Has a Daughter and son with a total of 10 grandchildren (6 grand daughters local and 4 grandsons)    Values family   Married twice, present wife incarcerated   Social Determinants of Health   Financial Resource Strain: Low Risk  (10/19/2022)   Overall Financial Resource Strain (CARDIA)    Difficulty of Paying Living Expenses: Not hard at all  Food Insecurity: No Food Insecurity (04/06/2023)   Hunger Vital Sign    Worried About Running Out of Food in the Last Year: Never true    Ran Out of Food in the Last Year: Never true  Transportation Needs: No Transportation Needs (04/06/2023)   PRAPARE -  Administrator, Civil Service (Medical): No    Lack of Transportation (Non-Medical): No  Physical Activity: Inactive (10/19/2022)   Exercise Vital Sign    Days of Exercise per Week: 0 days    Minutes of Exercise per Session: 0 min  Stress: No Stress Concern Present (10/19/2022)   [name] of Occupational Health - Occupational Stress Questionnaire    Feeling of Stress : Not at all  Social Connections: Moderately Integrated (03/31/2022)   Social Connection and Isolation Panel [NHANES]    Frequency of Communication with Friends and Family: Three times a week    Frequency of Social Gatherings with Friends and Family: Three times a week    Attends Religious Services: 1 to 4 times per year    Active Member of Clubs or Organizations: Yes    Attends Banker Meetings: 1 to 4 times per year    Marital Status: Never married   Additional Social History:                         Sleep: Fair  Appetite:  Fair  Current Medications: Current Facility-Administered Medications  Medication Dose Route Frequency Provider Last Rate Last Admin   acetaminophen (TYLENOL) tablet 650 mg  650 mg Oral Q6H PRN [name], [name], NP       albuterol (VENTOLIN HFA) 108 (90 Base) MCG/ACT inhaler 2 puff  2 puff Inhalation Q6H PRN Boyd Litaker T, MD       alum & mag hydroxide-simeth (MAALOX/MYLANTA) 200-200-20 MG/5ML suspension 30 mL  30 mL Oral Q4H PRN [name], [name], NP       ARIPiprazole (ABILIFY) tablet 10 mg  10 mg Oral QPC breakfast [name], DO   10 mg at 04/15/23 1022   atorvastatin (LIPITOR) tablet 80 mg  80 mg Oral Daily [name], DO   80 mg at 04/15/23 1022   diltiazem (CARDIZEM CD) 24 hr capsule 300 mg  300 mg Oral Daily [name], DO   300 mg at 04/15/23 1023   doxepin (SINEQUAN) capsule 50 mg  50 mg Oral QHS [name], DO   50 mg at 04/14/23 2203   hydrOXYzine (ATARAX) tablet 25 mg  25 mg Oral TID  PRN [name], [name], NP       latanoprost (XALATAN) 0.005 % ophthalmic solution 1 drop  1 drop Both Eyes QHS Paislee Szatkowski T, MD   1 drop at 04/14/23 2204   LORazepam (ATIVAN) injection 2 mg  2 mg Intramuscular TID PRN Mancel Bale, NP  LORazepam (ATIVAN) tablet 1 mg  1 mg Oral Q4H PRN [name], DO       magnesium hydroxide (MILK OF MAGNESIA) suspension 30 mL  30 mL Oral Daily PRN [name], [name], NP       mirtazapine (REMERON) tablet 45 mg  45 mg Oral QHS Addalynn Kumari T, MD   45 mg at 04/14/23 2203   mometasone-formoterol (DULERA) 200-5 MCG/ACT inhaler 2 puff  2 puff Inhalation BID Malik, Kashif   2 puff at 04/15/23 1022   OLANZapine (ZYPREXA) tablet 10 mg  10 mg Oral Q6H PRN [name], DO       traZODone (DESYREL) tablet 50 mg  50 mg Oral QHS PRN [name], [name], NP   50 mg at 04/14/23 2204    Lab Results: No results found for this or any previous visit (from the past 48 hour(s)).  Blood Alcohol level:  Lab Results  Component Value Date   ETH <10 04/05/2023   ETH <10 01/10/2023    Metabolic Disorder Labs: Lab Results  Component Value Date   HGBA1C 5.2 01/13/2023   MPG 103 01/13/2023   MPG 91.06 09/07/2020   No results found for: "PROLACTIN" Lab Results  Component Value Date   CHOL 168 01/13/2023   TRIG 62 01/13/2023   HDL 57 01/13/2023   CHOLHDL 2.9 01/13/2023   VLDL 12 01/13/2023   LDLCALC 99 01/13/2023   LDLCALC 104 (H) 05/17/2022    Physical Findings: AIMS:  , ,  ,  ,    CIWA:    COWS:     Musculoskeletal: Strength & Muscle Tone: within normal limits Gait & Station: normal Patient leans: N/A  Psychiatric Specialty Exam:  Presentation  General Appearance:  Appropriate for Environment; Casual  Eye Contact: Fair  Speech: Normal Rate  Speech Volume: Decreased  Handedness: Right   Mood and Affect  Mood: Depressed  Affect: Appropriate; Congruent   Thought Process  Thought Processes: Goal  Directed; Coherent  Descriptions of Associations:Intact  Orientation:Full (Time, Place and Person)  Thought Content:Logical  History of Schizophrenia/Schizoaffective disorder:No  Duration of Psychotic Symptoms:Less than six months  Hallucinations:No data recorded Ideas of Reference:None  Suicidal Thoughts:No data recorded Homicidal Thoughts:No data recorded  Sensorium  Memory: Immediate Fair; Remote Fair  Judgment: Fair  Insight: Fair   Art therapist  Concentration: Fair  Attention Span: Fair  Recall: [employer] of Knowledge: Fair  Language: Fair   Psychomotor Activity  Psychomotor Activity:No data recorded  Assets  Assets: Communication Skills; Desire for Improvement; Financial Resources/Insurance; Housing   Sleep  Sleep:No data recorded   Physical Exam: Physical Exam Vitals and nursing note reviewed.  Constitutional:      Appearance: Normal appearance.  HENT:     Head: Normocephalic and atraumatic.     Mouth/Throat:     Pharynx: Oropharynx is clear.  Eyes:     Pupils: Pupils are equal, round, and reactive to light.  Cardiovascular:     Rate and Rhythm: Normal rate and regular rhythm.  Pulmonary:     Effort: Pulmonary effort is normal.     Breath sounds: Normal breath sounds.  Abdominal:     General: Abdomen is flat.     Palpations: Abdomen is soft.  Musculoskeletal:        General: Normal range of motion.  Skin:    General: Skin is warm and dry.  Neurological:     General: No focal deficit present.     Mental  Status: He is alert. Mental status is at baseline.  Psychiatric:        Attention and Perception: Attention normal.        Mood and Affect: Mood normal.        Speech: Speech normal.        Behavior: Behavior normal.        Thought Content: Thought content normal.        Cognition and Memory: Cognition normal.        Judgment: Judgment normal.    Review of Systems  Constitutional: Negative.   HENT: Negative.     Eyes: Negative.   Respiratory: Negative.    Cardiovascular: Negative.   Gastrointestinal: Negative.   Musculoskeletal: Negative.   Skin: Negative.   Neurological: Negative.   Psychiatric/Behavioral: Negative.     Blood pressure (!) 142/87, pulse 71, temperature 99.1 F (37.3 C), resp. rate 20, height 6\' 4"  (1.93 m), weight 113.8 kg, SpO2 97 %. Body mass index is 30.54 kg/m.   Treatment Plan Summary: Plan no change to medication.  Encourage patient to be positive.  Possibly could be discharged after the weekend  Mordecai Rasmussen, MD 04/15/2023, 6:02 PM

## 2023-04-15 NOTE — Progress Notes (Signed)
Patient A&Ox4. Patient denies SI/HI and AVH. Patient observed in dayroom interacting with peers appropriately and participated in group. No acute distress noted. Support and encouragement provided. Routine safety checks conducted according to facility protocol. Will continue to monitor for safety.

## 2023-04-16 DIAGNOSIS — F332 Major depressive disorder, recurrent severe without psychotic features: Secondary | ICD-10-CM | POA: Diagnosis not present

## 2023-04-16 MED ORDER — DILTIAZEM HCL ER COATED BEADS 180 MG PO CP24
180.0000 mg | ORAL_CAPSULE | Freq: Every day | ORAL | Status: DC
  Administered 2023-04-17 – 2023-04-19 (×3): 180 mg via ORAL
  Filled 2023-04-16 (×3): qty 1

## 2023-04-16 NOTE — Group Note (Signed)
Date:  04/16/2023 Time:  9:37 PM  Group Topic/Focus:  Recovery Goals:   The focus of this group is to identify appropriate goals for recovery and establish a plan to achieve them.    Participation Level:  Active  Participation Quality:  Appropriate  Affect:  Appropriate  Cognitive:  Appropriate  Insight: Good  Engagement in Group:  Engaged  Modes of Intervention:  Support  Additional Comments:    Garry Heater 04/16/2023, 9:37 PM

## 2023-04-16 NOTE — Progress Notes (Signed)
   04/16/23 2100  Psych Admission Type (Psych Patients Only)  Admission Status Voluntary  Psychosocial Assessment  Patient Complaints Worrying  Eye Contact Fair  Facial Expression Pensive  Affect Sad  Speech Logical/coherent  Interaction Minimal  Motor Activity Other (Comment)  Appearance/Hygiene Unremarkable  Behavior Characteristics Cooperative;Appropriate to situation  Mood Pleasant  Thought Process  Coherency WDL  Content WDL  Delusions None reported or observed  Perception WDL  Hallucination None reported or observed  Judgment WDL  Confusion None  Danger to Self  Current suicidal ideation? Denies (Denies)  Agreement Not to Harm Self Yes  Description of Agreement verbal  Danger to Others  Danger to Others None reported or observed   Patient compliant with medications denies SI/HI/A/VH and verbally contracts for safety interacting well with Peers and Staff. Complaint with treatment plan. Q 15 minutes safety checks ongoing without self harm gestures.

## 2023-04-16 NOTE — Progress Notes (Signed)
[facility] MD Progress Note  04/16/2023 10:56 AM Robert Chan  MRN:  [id number] Subjective: Patient seen and chart reviewed.  Patient is complaining that he feels too tired during the day.  He thinks his medicines at night are leaving him excessively fatigued.  He says his mood today is not quite as good as yesterday.  Still feeling depressed.  No active suicidal intent but still nervous and somewhat hopeless.  Blood pressure today was low for what ever reason.  Patient claims that he has been eating and drinking adequately. Principal Problem: MDD (major depressive disorder), recurrent severe, without psychosis (HCC) Diagnosis: Principal Problem:   MDD (major depressive disorder), recurrent severe, without psychosis (HCC)  Total Time spent with patient: 30 minutes  Past Psychiatric History: Past history of recurrent severe depression with recent suicidal behavior  Past Medical History:  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   Depression    Elevated serum creatinine 08/28/2019   Homelessness 06/19/2020   Hypertension    NSTEMI (non-ST elevated myocardial infarction) [facility])    Status post incision and drainage 04/22/2021   Suicidal ideation 06/05/2020    Past Surgical History:  Procedure Laterality Date   APPENDECTOMY     EYE SURGERY     LEFT HEART CATH AND CORONARY ANGIOGRAPHY N/A 06/05/2020   Procedure: LEFT HEART CATH AND CORONARY ANGIOGRAPHY;  Surgeon: Elder Negus, MD;  Location: [facility] INVASIVE CV LAB;  Service: Cardiovascular;  Laterality: N/A;   PROSTATE SURGERY     Family History:  Family History  Problem Relation Age of Onset   Hypertension Mother    Diabetes Mother    Hypertension Father     Hypertension Sister    Family Psychiatric  History: See previous Social History:  Social History   Substance and Sexual Activity  Alcohol Use Yes   Comment: drinks 2x/wk, 2-3 40 oz drinks, last drink 2 weeks ago     Social History   Substance and Sexual Activity  Drug Use Not Currently   Types: Cocaine   Comment: cocaine use once a month, last use 6 mos ago    Social History   Socioeconomic History   Marital status: Single    Spouse name: Not on file   Number of children: Not on file   Years of education: Not on file   Highest education level: Not on file  Occupational History   Not on file  Tobacco Use   Smoking status: Some Days    Types: Cigarettes    Last attempt to quit: 12/25/2020    Years since quitting: 2.3   Smokeless tobacco: Never   Tobacco comments:    Smokes a couple cigarettes every now and then  Vaping Use   Vaping Use: Never used  Substance and Sexual Activity   Alcohol use: Yes    Comment: drinks 2x/wk, 2-3 40 oz drinks, last drink 2 weeks ago   Drug use: Not Currently    Types: Cocaine    Comment: cocaine use once a month, last use 6 mos ago   Sexual activity: Not Currently  Other Topics Concern   Not on file  Social History Narrative   His  mother passed at age 40 (when he was 68 years old) and his father raised him and his siblings   His parents were pastors as a lot of his sisters and other family members are today   There was a total of 13 children in his family Had 33 sisters, one sister deceased , Had 3 brothers, all brothers deceased   He is the youngest of the 75 and was "always in trouble in my younger years"   Family still live in [place], [place], some in [place], [place]   Has a Daughter and son with a total of 10 grandchildren (6 grand daughters local and 4 grandsons)    Values family   Married twice, present wife incarcerated   Social Determinants of Health   Financial Resource Strain: Low Risk  (10/19/2022)   Overall Financial  Resource Strain (CARDIA)    Difficulty of Paying Living Expenses: Not hard at all  Food Insecurity: No Food Insecurity (04/06/2023)   Hunger Vital Sign    Worried About Running Out of Food in the Last Year: Never true    Ran Out of Food in the Last Year: Never true  Transportation Needs: No Transportation Needs (04/06/2023)   PRAPARE - Administrator, Civil Service (Medical): No    Lack of Transportation (Non-Medical): No  Physical Activity: Inactive (10/19/2022)   Exercise Vital Sign    Days of Exercise per Week: 0 days    Minutes of Exercise per Session: 0 min  Stress: No Stress Concern Present (10/19/2022)   [name] of Occupational Health - Occupational Stress Questionnaire    Feeling of Stress : Not at all  Social Connections: Moderately Integrated (03/31/2022)   Social Connection and Isolation Panel [NHANES]    Frequency of Communication with Friends and Family: Three times a week    Frequency of Social Gatherings with Friends and Family: Three times a week    Attends Religious Services: 1 to 4 times per year    Active Member of Clubs or Organizations: Yes    Attends Banker Meetings: 1 to 4 times per year    Marital Status: Never married   Additional Social History:                         Sleep: Fair  Appetite:  Fair  Current Medications: Current Facility-Administered Medications  Medication Dose Route Frequency Provider Last Rate Last Admin   acetaminophen (TYLENOL) tablet 650 mg  650 mg Oral Q6H PRN [name], [name], NP       albuterol (VENTOLIN HFA) 108 (90 Base) MCG/ACT inhaler 2 puff  2 puff Inhalation Q6H PRN Alexandrea Westergard T, MD       alum & mag hydroxide-simeth (MAALOX/MYLANTA) 200-200-20 MG/5ML suspension 30 mL  30 mL Oral Q4H PRN [name], [name], NP       ARIPiprazole (ABILIFY) tablet 10 mg  10 mg Oral QPC breakfast [name], DO   10 mg at 04/16/23 0933   atorvastatin (LIPITOR) tablet 80 mg  80 mg  Oral Daily [name], DO   80 mg at 04/16/23 0933   [START ON 04/17/2023] diltiazem (CARDIZEM CD) 24 hr capsule 180 mg  180 mg Oral Daily Tahtiana Rozier, Jackquline Denmark, MD       hydrOXYzine (ATARAX) tablet 25 mg  25 mg Oral TID PRN [name], [name], NP       latanoprost (XALATAN) 0.005 % ophthalmic solution  1 drop  1 drop Both Eyes QHS Teghan Philbin T, MD   1 drop at 04/15/23 2121   LORazepam (ATIVAN) injection 2 mg  2 mg Intramuscular TID PRN [name], [name], NP       LORazepam (ATIVAN) tablet 1 mg  1 mg Oral Q4H PRN [name], DO       magnesium hydroxide (MILK OF MAGNESIA) suspension 30 mL  30 mL Oral Daily PRN [name], [name], NP       mirtazapine (REMERON) tablet 45 mg  45 mg Oral QHS Weslie Pretlow T, MD   45 mg at 04/15/23 2124   mometasone-formoterol (DULERA) 200-5 MCG/ACT inhaler 2 puff  2 puff Inhalation BID Malik, Kashif   2 puff at 04/16/23 0933   OLANZapine (ZYPREXA) tablet 10 mg  10 mg Oral Q6H PRN [name], DO       traZODone (DESYREL) tablet 50 mg  50 mg Oral QHS PRN [name], [name], NP   50 mg at 04/15/23 2124    Lab Results: No results found for this or any previous visit (from the past 48 hour(s)).  Blood Alcohol level:  Lab Results  Component Value Date   ETH <10 04/05/2023   ETH <10 01/10/2023    Metabolic Disorder Labs: Lab Results  Component Value Date   HGBA1C 5.2 01/13/2023   MPG 103 01/13/2023   MPG 91.06 09/07/2020   No results found for: "PROLACTIN" Lab Results  Component Value Date   CHOL 168 01/13/2023   TRIG 62 01/13/2023   HDL 57 01/13/2023   CHOLHDL 2.9 01/13/2023   VLDL 12 01/13/2023   LDLCALC 99 01/13/2023   LDLCALC 104 (H) 05/17/2022    Physical Findings: AIMS:  , ,  ,  ,    CIWA:    COWS:     Musculoskeletal: Strength & Muscle Tone: within normal limits Gait & Station: normal Patient leans: N/A  Psychiatric Specialty Exam:  Presentation  General Appearance:  Appropriate for  Environment; Casual  Eye Contact: Fair  Speech: Normal Rate  Speech Volume: Decreased  Handedness: Right   Mood and Affect  Mood: Depressed  Affect: Appropriate; Congruent   Thought Process  Thought Processes: Goal Directed; Coherent  Descriptions of Associations:Intact  Orientation:Full (Time, Place and Person)  Thought Content:Logical  History of Schizophrenia/Schizoaffective disorder:No  Duration of Psychotic Symptoms:Less than six months  Hallucinations:No data recorded Ideas of Reference:None  Suicidal Thoughts:No data recorded Homicidal Thoughts:No data recorded  Sensorium  Memory: Immediate Fair; Remote Fair  Judgment: Fair  Insight: Fair   Art therapist  Concentration: Fair  Attention Span: Fair  Recall: [employer] of Knowledge: Fair  Language: Fair   Psychomotor Activity  Psychomotor Activity:No data recorded  Assets  Assets: Communication Skills; Desire for Improvement; Financial Resources/Insurance; Housing   Sleep  Sleep:No data recorded   Physical Exam: Physical Exam Vitals and nursing note reviewed.  Constitutional:      Appearance: Normal appearance.  HENT:     Head: Normocephalic and atraumatic.     Mouth/Throat:     Pharynx: Oropharynx is clear.  Eyes:     Pupils: Pupils are equal, round, and reactive to light.  Cardiovascular:     Rate and Rhythm: Normal rate and regular rhythm.  Pulmonary:     Effort: Pulmonary effort is normal.     Breath sounds: Normal breath sounds.  Abdominal:     General: Abdomen is flat.     Palpations: Abdomen is  soft.  Musculoskeletal:        General: Normal range of motion.  Skin:    General: Skin is warm and dry.  Neurological:     General: No focal deficit present.     Mental Status: He is alert. Mental status is at baseline.  Psychiatric:        Attention and Perception: Attention normal.        Mood and Affect: Mood is depressed. Affect is blunt.         Speech: Speech is delayed.        Behavior: Behavior is slowed.        Thought Content: Thought content normal.    Review of Systems  Constitutional:  Positive for malaise/fatigue.  HENT: Negative.    Eyes: Negative.   Respiratory: Negative.    Cardiovascular: Negative.   Gastrointestinal: Negative.   Musculoskeletal: Negative.   Skin: Negative.   Neurological: Negative.   Psychiatric/Behavioral:  Positive for depression. Negative for hallucinations and suicidal ideas.    Blood pressure 111/69, pulse 72, temperature 98.6 F (37 C), resp. rate 20, height 6\' 4"  (1.93 m), weight 113.8 kg, SpO2 100 %. Body mass index is 30.54 kg/m.   Treatment Plan Summary: Plan reviewed medicine.  I think we can discontinue the Sinequan at night which could be 1 source of being too sleepy.  I would rather not cut back on the Remeron as we had been using that as his primary antidepressant.  I reviewed it and it looked like he had been on the Abilify before and that has been chronic so I am not going to touch that yet.  Not sure why his blood pressure was low today.  Nursing held his blood pressure medicine.  I will cut the dosage in half going forward.  Mordecai Rasmussen, MD 04/16/2023, 10:56 AM

## 2023-04-16 NOTE — [facility] Group Notes (Signed)
[facility] Group Notes:  (Nursing/MHT/Case Management/Adjunct)  Date:  04/16/2023  Time:  9:53 AM  Type of Therapy:  Movement Therapy  Participation Level:  Did Not Attend   [name] 04/16/2023, 9:53 AM

## 2023-04-16 NOTE — Progress Notes (Signed)
Patient pleasant and cooperative.  Patient reports he is feeling groggy and tired after his nighttime medications.  Plans to ask psychiatrist to adjust these medications.  Denies SI,HI and AVH.  Denies pain.  BP readings lower than the patient's normal - Cardizem held.  Dr. Toni Amend made aware.  Fluids encouraged. Compliant with scheduled medications.  15 min checks in place for safety.  Patient present in the milieu this afternoon.  Appropriate interaction with peers and staff.

## 2023-04-16 NOTE — Plan of Care (Signed)
  Problem: Education: Goal: Knowledge of General Education information will improve Description: Including pain rating scale, medication(s)/side effects and non-pharmacologic comfort measures Outcome: Progressing   Problem: Activity: Goal: Risk for activity intolerance will decrease Outcome: Progressing   Problem: Nutrition: Goal: Adequate nutrition will be maintained Outcome: Progressing   Problem: Coping: Goal: Level of anxiety will decrease Outcome: Progressing   

## 2023-04-16 NOTE — BH IP Treatment Plan (Signed)
Interdisciplinary Treatment and Diagnostic Plan Update  04/16/2023 Time of Session: 9:36 am Robert Chan MRN: [id number]  Principal Diagnosis: MDD (major depressive disorder), recurrent severe, without psychosis (HCC)  Secondary Diagnoses: Principal Problem:   MDD (major depressive disorder), recurrent severe, without psychosis (HCC)   Current Medications:  Current Facility-Administered Medications  Medication Dose Route Frequency Provider Last Rate Last Admin   acetaminophen (TYLENOL) tablet 650 mg  650 mg Oral Q6H PRN [name], [name], NP       albuterol (VENTOLIN HFA) 108 (90 Base) MCG/ACT inhaler 2 puff  2 puff Inhalation Q6H PRN Clapacs, Jackquline Denmark, MD       alum & mag hydroxide-simeth (MAALOX/MYLANTA) 200-200-20 MG/5ML suspension 30 mL  30 mL Oral Q4H PRN [name], [name], NP       ARIPiprazole (ABILIFY) tablet 10 mg  10 mg Oral QPC breakfast [name], DO   10 mg at 04/16/23 0933   atorvastatin (LIPITOR) tablet 80 mg  80 mg Oral Daily [name], DO   80 mg at 04/16/23 0933   diltiazem (CARDIZEM CD) 24 hr capsule 300 mg  300 mg Oral Daily [name], DO   300 mg at 04/15/23 1023   doxepin (SINEQUAN) capsule 50 mg  50 mg Oral QHS [name], DO   50 mg at 04/15/23 2124   hydrOXYzine (ATARAX) tablet 25 mg  25 mg Oral TID PRN [name], [name], NP       latanoprost (XALATAN) 0.005 % ophthalmic solution 1 drop  1 drop Both Eyes QHS Clapacs, John T, MD   1 drop at 04/15/23 2121   LORazepam (ATIVAN) injection 2 mg  2 mg Intramuscular TID PRN [name], [name], NP       LORazepam (ATIVAN) tablet 1 mg  1 mg Oral Q4H PRN [name], DO       magnesium hydroxide (MILK OF MAGNESIA) suspension 30 mL  30 mL Oral Daily PRN [name], [name], NP       mirtazapine (REMERON) tablet 45 mg  45 mg Oral QHS Clapacs, John T, MD   45 mg at 04/15/23 2124   mometasone-formoterol (DULERA) 200-5 MCG/ACT inhaler 2 puff  2 puff  Inhalation BID Malik, Kashif   2 puff at 04/16/23 0933   OLANZapine (ZYPREXA) tablet 10 mg  10 mg Oral Q6H PRN [name], DO       traZODone (DESYREL) tablet 50 mg  50 mg Oral QHS PRN [name], [name], NP   50 mg at 04/15/23 2124   PTA Medications: Medications Prior to Admission  Medication Sig Dispense Refill Last Dose   albuterol (VENTOLIN HFA) 108 (90 Base) MCG/ACT inhaler Inhale 1-2 puffs into the lungs every 6 (six) hours as needed for wheezing or shortness of breath. 90 g 2    ARIPiprazole (ABILIFY) 15 MG tablet Take 1 tablet (15 mg total) by mouth daily. 30 tablet 3    atorvastatin (LIPITOR) 80 MG tablet Take 1 tablet (80 mg total) by mouth daily. 30 tablet 1    budesonide-formoterol (SYMBICORT) 160-4.5 MCG/ACT inhaler Inhale 2 puffs into the lungs 2 (two) times daily. 1 each 3    diltiazem (CARDIZEM CD) 300 MG 24 hr capsule Take 1 capsule (300 mg total) by mouth daily. 30 capsule 3    doxepin (SINEQUAN) 50 MG capsule Take 1 capsule (50 mg total) by mouth at bedtime. 30 capsule 1    latanoprost (XALATAN) 0.005 % ophthalmic solution Place 1  drop into both eyes at bedtime. 2.5 mL 1    mirtazapine (REMERON) 30 MG tablet Take 1 tablet (30 mg total) by mouth at bedtime. 30 tablet 3     Patient Stressors: Medication change or noncompliance   Substance abuse   Other: depression    Patient Strengths: Ability for insight  Capable of independent living  Physical Health   Treatment Modalities: Medication Management, Group therapy, Case management,  1 to 1 session with clinician, Psychoeducation, Recreational therapy.   Physician Treatment Plan for Primary Diagnosis: MDD (major depressive disorder), recurrent severe, without psychosis (HCC) Long Term Goal(s): Improvement in symptoms so as ready for discharge   Short Term Goals: Ability to identify changes in lifestyle to reduce recurrence of condition will improve Ability to verbalize feelings will improve Ability to  disclose and discuss suicidal ideas Ability to demonstrate self-control will improve Ability to identify and develop effective coping behaviors will improve Ability to maintain clinical measurements within normal limits will improve Compliance with prescribed medications will improve Ability to identify triggers associated with substance abuse/mental health issues will improve  Medication Management: Evaluate patient's response, side effects, and tolerance of medication regimen.  Therapeutic Interventions: 1 to 1 sessions, Unit Group sessions and Medication administration.  Evaluation of Outcomes: Progressing  Physician Treatment Plan for Secondary Diagnosis: Principal Problem:   MDD (major depressive disorder), recurrent severe, without psychosis (HCC)  Long Term Goal(s): Improvement in symptoms so as ready for discharge   Short Term Goals: Ability to identify changes in lifestyle to reduce recurrence of condition will improve Ability to verbalize feelings will improve Ability to disclose and discuss suicidal ideas Ability to demonstrate self-control will improve Ability to identify and develop effective coping behaviors will improve Ability to maintain clinical measurements within normal limits will improve Compliance with prescribed medications will improve Ability to identify triggers associated with substance abuse/mental health issues will improve     Medication Management: Evaluate patient's response, side effects, and tolerance of medication regimen.  Therapeutic Interventions: 1 to 1 sessions, Unit Group sessions and Medication administration.  Evaluation of Outcomes: Progressing   RN Treatment Plan for Primary Diagnosis: MDD (major depressive disorder), recurrent severe, without psychosis (HCC) Long Term Goal(s): Knowledge of disease and therapeutic regimen to maintain health will improve  Short Term Goals: Ability to remain free from injury will improve, Ability to  verbalize frustration and anger appropriately will improve, Ability to demonstrate self-control, Ability to participate in decision making will improve, Ability to verbalize feelings will improve, Ability to disclose and discuss suicidal ideas, Ability to identify and develop effective coping behaviors will improve, and Compliance with prescribed medications will improve  Medication Management: RN will administer medications as ordered by provider, will assess and evaluate patient's response and provide education to patient for prescribed medication. RN will report any adverse and/or side effects to prescribing provider.  Therapeutic Interventions: 1 on 1 counseling sessions, Psychoeducation, Medication administration, Evaluate responses to treatment, Monitor vital signs and CBGs as ordered, Perform/monitor CIWA, COWS, AIMS and Fall Risk screenings as ordered, Perform wound care treatments as ordered.  Evaluation of Outcomes: Progressing   LCSW Treatment Plan for Primary Diagnosis: MDD (major depressive disorder), recurrent severe, without psychosis (HCC) Long Term Goal(s): Safe transition to appropriate next level of care at discharge, Engage patient in therapeutic group addressing interpersonal concerns.  Short Term Goals: Engage patient in aftercare planning with referrals and resources, Increase social support, Increase ability to appropriately verbalize feelings, Increase emotional regulation, Facilitate acceptance of  mental health diagnosis and concerns, Facilitate patient progression through stages of change regarding substance use diagnoses and concerns, Identify triggers associated with mental health/substance abuse issues, and Increase skills for wellness and recovery  Therapeutic Interventions: Assess for all discharge needs, 1 to 1 time with Social worker, Explore available resources and support systems, Assess for adequacy in community support network, Educate family and significant  other(s) on suicide prevention, Complete Psychosocial Assessment, Interpersonal group therapy.  Evaluation of Outcomes: Progressing   Progress in Treatment: Attending groups: Yes. Participating in groups: Yes. Taking medication as prescribed: Yes. Toleration medication: Yes. Family/Significant other contact made: No, will contact:  Patient declined Patient understands diagnosis: Yes. Discussing patient identified problems/goals with staff: Yes. Medical problems stabilized or resolved: Yes. Denies suicidal/homicidal ideation: Yes. and No. Issues/concerns per patient self-inventory: No. Other: none  New problem(s) identified: No, Describe:  none  New Short Term/Long Term Goal(s):  detox, medication management for mood stabilization; elimination of SI thoughts; development of comprehensive mental wellness/sobriety plan. Update 04/11/2023:  No changes at this time. Update 04/16/23: No changes at this time.  Patient Goals:  No changes at this time. Update 04/16/23: No changes at this time.  Discharge Plan or Barriers: CSW to assist in development of appropriate discharge plans. Update 04/11/2023:  Patient remains on the unit and safe at this time.  Patient reports plans to return home and begin with treatment services. Update 04/16/23: No changes at this time.  Reason for Continuation of Hospitalization: Depression Medication stabilization Suicidal ideation  Estimated Length of Stay:  1-7 days Update 04/11/2023:  No changes at this time. Update 04/16/23: TBD  Last 3 [place] Suicide Severity Risk Score: Flowsheet Row Admission (Current) from [facility]06/20[facility] ED from 04/05/2023 in [facility] at [facility] Admission (Discharged) from 01/10/2023 in [facility] BE[facility]AL MED[facility]INE  C-SSRS RISK CATEGORY High Risk High Risk High Risk       Last PHQ 2/9 Scores:    10/19/2022    2:53 PM 05/11/2022    2:30 PM 03/31/2022    2:15 PM   Depression screen PHQ 2/9  Decreased Interest 0 1 0  Down, Depressed, Hopeless 0 1 0  PHQ - 2 Score 0 2 0  Altered sleeping  0   Tired, decreased energy  1   Change in appetite  1   Trouble concentrating  1   Moving slowly or fidgety/restless  0   Suicidal thoughts  0   PHQ-9 Score  5     Scribe for Treatment Team: Marshell Levan, [place] 04/16/2023 9:36 AM

## 2023-04-17 DIAGNOSIS — F332 Major depressive disorder, recurrent severe without psychotic features: Secondary | ICD-10-CM | POA: Diagnosis not present

## 2023-04-17 NOTE — Group Note (Signed)
Date:  04/17/2023 Time:  11:26 PM  Group Topic/Focus:  Building Self Esteem:   The Focus of this group is helping patients become aware of the effects of self-esteem on their lives, the things they and others do that enhance or undermine their self-esteem, seeing the relationship between their level of self-esteem and the choices they make and learning ways to enhance self-esteem. Coping With Mental Health Crisis:   The purpose of this group is to help patients identify strategies for coping with mental health crisis.  Group discusses possible causes of crisis and ways to manage them effectively. Developing a Wellness Toolbox:   The focus of this group is to help patients develop a "wellness toolbox" with skills and strategies to promote recovery upon discharge. Goals Group:   The focus of this group is to help patients establish daily goals to achieve during treatment and discuss how the patient can incorporate goal setting into their daily lives to aide in recovery. Healthy Communication:   The focus of this group is to discuss communication, barriers to communication, as well as healthy ways to communicate with others. Making Healthy Choices:   The focus of this group is to help patients identify negative/unhealthy choices they were using prior to admission and identify positive/healthier coping strategies to replace them upon discharge. Managing Feelings:   The focus of this group is to identify what feelings patients have difficulty handling and develop a plan to handle them in a healthier way upon discharge. Overcoming Stress:   The focus of this group is to define stress and help patients assess their triggers. Personal Choices and Values:   The focus of this group is to help patients assess and explore the importance of values in their lives, how their values affect their decisions, how they express their values and what opposes their expression.    Participation Level:   Active  Participation Quality:  Appropriate  Affect:  Appropriate  Cognitive:  Appropriate  Insight: Appropriate  Engagement in Group:  Engaged  Modes of Intervention:  Discussion  Additional Comments:    Robert Chan 04/17/2023, 11:26 PM

## 2023-04-17 NOTE — Group Note (Signed)
LCSW Group Therapy Note   Group Date: 04/17/2023 Start Time: 1315 End Time: 1355   Type of Therapy and Topic:  Group Therapy: Geriatric Depression  Participation Level:  Active    Summary of Patient Progress:  The patient attended group. The patient answered the icebreaker question stating that his daughter is a gift that he would never forget. The patient stated that his signs of depression include loss of interest in things. Stating that he use to play basketball on the regular but he lost interest. The patient stated that his granddaughters motivates him to do things.      Marshell Levan, LCSWA 04/17/2023  2:05 PM

## 2023-04-17 NOTE — Progress Notes (Signed)
Patient is alert.  Confused to location.  Pleasant and cooperative.  Chatted about places he has lives.  Patient denies SI/HI/AVH.  States he feels good.  He denies feeling sad.  Taking meds as ordered.  Patient spent time in the milieu.  Continues on Q15 minute checks.

## 2023-04-17 NOTE — Plan of Care (Signed)
  Problem: Education: Goal: Knowledge of General Education information will improve Description: Including pain rating scale, medication(s)/side effects and non-pharmacologic comfort measures Outcome: Progressing   Problem: Coping: Goal: Level of anxiety will decrease Outcome: Progressing   Problem: Safety: Goal: Ability to remain free from injury will improve Outcome: Progressing   

## 2023-04-17 NOTE — Progress Notes (Signed)
[facility] MD Progress Note  04/17/2023 3:03 PM Robert Chan  MRN:  [id number] Subjective: Feeling better.  Less depressed.  Less sleepy this morning Principal Problem: MDD (major depressive disorder), recurrent severe, without psychosis (HCC) Diagnosis: Principal Problem:   MDD (major depressive disorder), recurrent severe, without psychosis (HCC)  Total Time spent with patient: 30 minutes  Past Psychiatric History: Past history of depression  Past Medical History:  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   Depression    Elevated serum creatinine 08/28/2019   Homelessness 06/19/2020   Hypertension    NSTEMI (non-ST elevated myocardial infarction) [facility])    Status post incision and drainage 04/22/2021   Suicidal ideation 06/05/2020    Past Surgical History:  Procedure Laterality Date   APPENDECTOMY     EYE SURGERY     LEFT HEART CATH AND CORONARY ANGIOGRAPHY N/A 06/05/2020   Procedure: LEFT HEART CATH AND CORONARY ANGIOGRAPHY;  Surgeon: Elder Negus, MD;  Location: [facility] INVASIVE CV [facility];  Service: Cardiovascular;  Laterality: N/A;   PROSTATE SURGERY     Family History:  Family History  Problem Relation Age of Onset   Hypertension Mother    Diabetes Mother    Hypertension Father    Hypertension Sister    Family Psychiatric  History: See previous Social History:  Social History   Substance and Sexual Activity  Alcohol Use Yes   Comment: drinks 2x/wk, 2-3 40 oz drinks, last drink 2 weeks ago     Social History   Substance and Sexual Activity  Drug Use Not Currently   Types: Cocaine   Comment: cocaine use once a month, last use 6 mos ago    Social History   Socioeconomic History   Marital  status: Single    Spouse name: Not on file   Number of children: Not on file   Years of education: Not on file   Highest education level: Not on file  Occupational History   Not on file  Tobacco Use   Smoking status: Some Days    Types: Cigarettes    Last attempt to quit: 12/25/2020    Years since quitting: 2.3   Smokeless tobacco: Never   Tobacco comments:    Smokes a couple cigarettes every now and then  Vaping Use   Vaping Use: Never used  Substance and Sexual Activity   Alcohol use: Yes    Comment: drinks 2x/wk, 2-3 40 oz drinks, last drink 2 weeks ago   Drug use: Not Currently    Types: Cocaine    Comment: cocaine use once a month, last use 6 mos ago   Sexual activity: Not Currently  Other Topics Concern   Not on file  Social History Narrative   His mother passed at age 27 (when he was 70 years old) and his father raised him and his siblings   His parents were pastors as a lot of his sisters and other family members are today   There was a total of 13 children in his family Had 59 sisters, one sister deceased , Had 3 brothers, all brothers deceased   He is the youngest of the 81 and was "always  in trouble in my younger years"   Family still live in [place], [place], some in [place], [place]   Has a Daughter and son with a total of 10 grandchildren (6 grand daughters local and 4 grandsons)    Values family   Married twice, present wife incarcerated   Social Determinants of Health   Financial Resource Strain: Low Risk  (10/19/2022)   Overall Financial Resource Strain (CARDIA)    Difficulty of Paying Living Expenses: Not hard at all  Food Insecurity: No Food Insecurity (04/06/2023)   Hunger Vital Sign    Worried About Running Out of Food in the Last Year: Never true    Ran Out of Food in the Last Year: Never true  Transportation Needs: No Transportation Needs (04/06/2023)   PRAPARE - Administrator, Civil Service (Medical): No    Lack of Transportation  (Non-Medical): No  Physical Activity: Inactive (10/19/2022)   Exercise Vital Sign    Days of Exercise per Week: 0 days    Minutes of Exercise per Session: 0 min  Stress: No Stress Concern Present (10/19/2022)   [name] of Occupational Health - Occupational Stress Questionnaire    Feeling of Stress : Not at all  Social Connections: Moderately Integrated (03/31/2022)   Social Connection and Isolation Panel [NHANES]    Frequency of Communication with Friends and Family: Three times a week    Frequency of Social Gatherings with Friends and Family: Three times a week    Attends Religious Services: 1 to 4 times per year    Active Member of Clubs or Organizations: Yes    Attends Banker Meetings: 1 to 4 times per year    Marital Status: Never married   Additional Social History:                         Sleep: Fair  Appetite:  Fair  Current Medications: Current Facility-Administered Medications  Medication Dose Route Frequency Provider Last Rate Last Admin   acetaminophen (TYLENOL) tablet 650 mg  650 mg Oral Q6H PRN [name], [name], NP       albuterol (VENTOLIN HFA) 108 (90 Base) MCG/ACT inhaler 2 puff  2 puff Inhalation Q6H PRN Eythan Jayne T, MD       alum & mag hydroxide-simeth (MAALOX/MYLANTA) 200-200-20 MG/5ML suspension 30 mL  30 mL Oral Q4H PRN [name], [name], NP       ARIPiprazole (ABILIFY) tablet 10 mg  10 mg Oral QPC breakfast [name], DO   10 mg at 04/17/23 0830   atorvastatin (LIPITOR) tablet 80 mg  80 mg Oral Daily [name], DO   80 mg at 04/17/23 1032   diltiazem (CARDIZEM CD) 24 hr capsule 180 mg  180 mg Oral Daily Jermey Closs T, MD   180 mg at 04/17/23 1031   hydrOXYzine (ATARAX) tablet 25 mg  25 mg Oral TID PRN [name], [name], NP       latanoprost (XALATAN) 0.005 % ophthalmic solution 1 drop  1 drop Both Eyes QHS Christen Bedoya T, MD   1 drop at 04/16/23 2113   LORazepam (ATIVAN) injection 2 mg   2 mg Intramuscular TID PRN [name], [name], NP       LORazepam (ATIVAN) tablet 1 mg  1 mg Oral Q4H PRN [name], DO       magnesium hydroxide (MILK OF MAGNESIA) suspension 30 mL  30 mL  Oral Daily PRN [name], [name], NP       mirtazapine (REMERON) tablet 45 mg  45 mg Oral QHS Sabah Zucco T, MD   45 mg at 04/16/23 2115   mometasone-formoterol (DULERA) 200-5 MCG/ACT inhaler 2 puff  2 puff Inhalation BID [name], Kashif   2 puff at 04/17/23 0830   OLANZapine (ZYPREXA) tablet 10 mg  10 mg Oral Q6H PRN [name], DO       traZODone (DESYREL) tablet 50 mg  50 mg Oral QHS PRN [name], [name], NP   50 mg at 04/15/23 2124    Lab Results: No results found for this or any previous visit (from the past 48 hour(s)).  Blood Alcohol level:  Lab Results  Component Value Date   ETH <10 04/05/2023   ETH <10 01/10/2023    Metabolic Disorder Labs: Lab Results  Component Value Date   HGBA1C 5.2 01/13/2023   MPG 103 01/13/2023   MPG 91.06 09/07/2020   No results found for: "PROLACTIN" Lab Results  Component Value Date   CHOL 168 01/13/2023   TRIG 62 01/13/2023   HDL 57 01/13/2023   CHOLHDL 2.9 01/13/2023   VLDL 12 01/13/2023   LDLCALC 99 01/13/2023   LDLCALC 104 (H) 05/17/2022    Physical Findings: AIMS:  , ,  ,  ,    CIWA:    COWS:     Musculoskeletal: Strength & Muscle Tone: within normal limits Gait & Station: normal Patient leans: N/A  Psychiatric Specialty Exam:  Presentation  General Appearance:  Appropriate for Environment; Casual  Eye Contact: Fair  Speech: Normal Rate  Speech Volume: Decreased  Handedness: Right   Mood and Affect  Mood: Depressed  Affect: Appropriate; Congruent   Thought Process  Thought Processes: Goal Directed; Coherent  Descriptions of Associations:Intact  Orientation:Full (Time, Place and Person)  Thought Content:Logical  History of Schizophrenia/Schizoaffective disorder:No  Duration  of Psychotic Symptoms:Less than six months  Hallucinations:No data recorded Ideas of Reference:None  Suicidal Thoughts:No data recorded Homicidal Thoughts:No data recorded  Sensorium  Memory: Immediate Fair; Remote Fair  Judgment: Fair  Insight: Fair   Art therapist  Concentration: Fair  Attention Span: Fair  Recall: [employer] of Knowledge: Fair  Language: Fair   Psychomotor Activity  Psychomotor Activity:No data recorded  Assets  Assets: Communication Skills; Desire for Improvement; Financial Resources/Insurance; Housing   Sleep  Sleep:No data recorded   Physical Exam: Physical Exam Constitutional:      Appearance: Normal appearance.  HENT:     Head: Normocephalic and atraumatic.     Mouth/Throat:     Pharynx: Oropharynx is clear.  Eyes:     Pupils: Pupils are equal, round, and reactive to light.  Cardiovascular:     Rate and Rhythm: Normal rate and regular rhythm.  Pulmonary:     Effort: Pulmonary effort is normal.     Breath sounds: Normal breath sounds.  Abdominal:     General: Abdomen is flat.     Palpations: Abdomen is soft.  Musculoskeletal:        General: Normal range of motion.  Skin:    General: Skin is warm and dry.  Neurological:     General: No focal deficit present.     Mental Status: He is alert. Mental status is at baseline.  Psychiatric:        Mood and Affect: Mood normal.        Thought Content: Thought content normal.  Review of Systems  Constitutional: Negative.   HENT: Negative.    Eyes: Negative.   Respiratory: Negative.    Cardiovascular: Negative.   Gastrointestinal: Negative.   Musculoskeletal: Negative.   Skin: Negative.   Neurological: Negative.   Psychiatric/Behavioral: Negative.     Blood pressure 133/79, pulse 70, temperature (!) 97.4 F (36.3 C), resp. rate 16, height 6\' 4"  (1.93 m), weight 113.8 kg, SpO2 99 %. Body mass index is 30.54 kg/m.   Treatment Plan Summary: Plan attended  therapy group today.  Says he is feeling better.  Hopes to anticipate discharge about Tuesday  Mordecai Rasmussen, MD 04/17/2023, 3:03 PM

## 2023-04-18 ENCOUNTER — Ambulatory Visit: Payer: Medicare HMO | Admitting: Podiatry

## 2023-04-18 NOTE — Plan of Care (Signed)
  Problem: Education: Goal: Knowledge of General Education information will improve Description: Including pain rating scale, medication(s)/side effects and non-pharmacologic comfort measures Outcome: Progressing   Problem: Activity: Goal: Risk for activity intolerance will decrease Outcome: Progressing   Problem: Nutrition: Goal: Adequate nutrition will be maintained Outcome: Progressing   Problem: Coping: Goal: Level of anxiety will decrease Outcome: Progressing   

## 2023-04-18 NOTE — Progress Notes (Signed)
Patient pleasant and cooperative.  Reports he still still feels groggy this morning, but not as much as yesterday.  Denies SI, HI and AVH.  Sad, flat affect.  Compliant with scheduled medications.  15 min checks in place for safety.  Patient present in the milieu, participating in MHT group.  Appropriate interaction with peers. Patient states he is ready for discharge in the next couple of days.

## 2023-04-18 NOTE — Group Note (Signed)
Date:  04/18/2023 Time:  10:33 PM  Group Topic/Focus:  Building Self Esteem:   The Focus of this group is helping patients become aware of the effects of self-esteem on their lives, the things they and others do that enhance or undermine their self-esteem, seeing the relationship between their level of self-esteem and the choices they make and learning ways to enhance self-esteem. Coping With Mental Health Crisis:   The purpose of this group is to help patients identify strategies for coping with mental health crisis.  Group discusses possible causes of crisis and ways to manage them effectively. Developing a Wellness Toolbox:   The focus of this group is to help patients develop a "wellness toolbox" with skills and strategies to promote recovery upon discharge. Goals Group:   The focus of this group is to help patients establish daily goals to achieve during treatment and discuss how the patient can incorporate goal setting into their daily lives to aide in recovery. Healthy Communication:   The focus of this group is to discuss communication, barriers to communication, as well as healthy ways to communicate with others. Identifying Needs:   The focus of this group is to help patients identify their personal needs that have been historically problematic and identify healthy behaviors to address their needs. Making Healthy Choices:   The focus of this group is to help patients identify negative/unhealthy choices they were using prior to admission and identify positive/healthier coping strategies to replace them upon discharge. Managing Feelings:   The focus of this group is to identify what feelings patients have difficulty handling and develop a plan to handle them in a healthier way upon discharge. Personal Choices and Values:   The focus of this group is to help patients assess and explore the importance of values in their lives, how their values affect their decisions, how they express their values  and what opposes their expression. Rediscovering Joy:   The focus of this group is to explore various ways to relieve stress in a positive manner.    Participation Level:  Active  Participation Quality:  Appropriate  Affect:  Appropriate  Cognitive:  Appropriate  Insight: Appropriate  Engagement in Group:  Developing/Improving, Engaged, Improving, and Supportive  Modes of Intervention:  Discussion and Support  Additional Comments:    Robert Chan 04/18/2023, 10:33 PM

## 2023-04-18 NOTE — Progress Notes (Signed)
[facility] MD Progress Note  04/18/2023 5:05 PM Robert Chan  MRN:  [id number] Subjective: No change to treatment plan.  Probable discharge tomorrow.  Feeling okay. Principal Problem: MDD (major depressive disorder), recurrent severe, without psychosis (HCC) Diagnosis: Principal Problem:   MDD (major depressive disorder), recurrent severe, without psychosis (HCC)  Total Time spent with patient: 30 minutes  Past Psychiatric History: Depression  Past Medical History:  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   Depression    Elevated serum creatinine 08/28/2019   Homelessness 06/19/2020   Hypertension    NSTEMI (non-ST elevated myocardial infarction) [facility])    Status post incision and drainage 04/22/2021   Suicidal ideation 06/05/2020    Past Surgical History:  Procedure Laterality Date   APPENDECTOMY     EYE SURGERY     LEFT HEART CATH AND CORONARY ANGIOGRAPHY N/A 06/05/2020   Procedure: LEFT HEART CATH AND CORONARY ANGIOGRAPHY;  Surgeon: Elder Negus, MD;  Location: [facility];  Service: Cardiovascular;  Laterality: N/A;   PROSTATE SURGERY     Family History:  Family History  Problem Relation Age of Onset   Hypertension Mother    Diabetes Mother    Hypertension Father    Hypertension Sister    Family Psychiatric  History: See above Social History:  Social History   Substance and Sexual Activity  Alcohol Use Yes   Comment: drinks 2x/wk, 2-3 40 oz drinks, last drink 2 weeks ago     Social History   Substance and Sexual Activity  Drug Use Not Currently   Types: Cocaine   Comment: cocaine use once a month, last use 6 mos ago    Social History   Socioeconomic History   Marital  status: Single    Spouse name: Not on file   Number of children: Not on file   Years of education: Not on file   Highest education level: Not on file  Occupational History   Not on file  Tobacco Use   Smoking status: Some Days    Types: Cigarettes    Last attempt to quit: 12/25/2020    Years since quitting: 2.3   Smokeless tobacco: Never   Tobacco comments:    Smokes a couple cigarettes every now and then  Vaping Use   Vaping Use: Never used  Substance and Sexual Activity   Alcohol use: Yes    Comment: drinks 2x/wk, 2-3 40 oz drinks, last drink 2 weeks ago   Drug use: Not Currently    Types: Cocaine    Comment: cocaine use once a month, last use 6 mos ago   Sexual activity: Not Currently  Other Topics Concern   Not on file  Social History Narrative   His mother passed at age 23 (when he was 31 years old) and his father raised him and his siblings   His parents were pastors as a lot of his sisters and other family members are today   There was a total of 13 children in his family Had 18 sisters, one sister deceased , Had 3 brothers, all brothers deceased   He is the youngest of the 13 and was "always in  trouble in my younger years"   Family still live in [place], [place], some in [place], [place]   Has a Daughter and son with a total of 10 grandchildren (6 grand daughters local and 4 grandsons)    Values family   Married twice, present wife incarcerated   Social Determinants of Health   Financial Resource Strain: Low Risk  (10/19/2022)   Overall Financial Resource Strain (CARDIA)    Difficulty of Paying Living Expenses: Not hard at all  Food Insecurity: No Food Insecurity (04/06/2023)   Hunger Vital Sign    Worried About Running Out of Food in the Last Year: Never true    Ran Out of Food in the Last Year: Never true  Transportation Needs: No Transportation Needs (04/06/2023)   PRAPARE - Administrator, Civil Service (Medical): No    Lack of Transportation  (Non-Medical): No  Physical Activity: Inactive (10/19/2022)   Exercise Vital Sign    Days of Exercise per Week: 0 days    Minutes of Exercise per Session: 0 min  Stress: No Stress Concern Present (10/19/2022)   [name] of Occupational Health - Occupational Stress Questionnaire    Feeling of Stress : Not at all  Social Connections: Moderately Integrated (03/31/2022)   Social Connection and Isolation Panel [NHANES]    Frequency of Communication with Friends and Family: Three times a week    Frequency of Social Gatherings with Friends and Family: Three times a week    Attends Religious Services: 1 to 4 times per year    Active Member of Clubs or Organizations: Yes    Attends Banker Meetings: 1 to 4 times per year    Marital Status: Never married   Additional Social History:                         Sleep: Fair  Appetite:  Fair  Current Medications: Current Facility-Administered Medications  Medication Dose Route Frequency Provider Last Rate Last Admin   acetaminophen (TYLENOL) tablet 650 mg  650 mg Oral Q6H PRN [name], [name], NP   650 mg at 04/18/23 1115   albuterol (VENTOLIN HFA) 108 (90 Base) MCG/ACT inhaler 2 puff  2 puff Inhalation Q6H PRN Gurpreet Mariani T, MD       alum & mag hydroxide-simeth (MAALOX/MYLANTA) 200-200-20 MG/5ML suspension 30 mL  30 mL Oral Q4H PRN [name], [name], NP       ARIPiprazole (ABILIFY) tablet 10 mg  10 mg Oral QPC breakfast [name], DO   10 mg at 04/18/23 0902   atorvastatin (LIPITOR) tablet 80 mg  80 mg Oral Daily [name], DO   80 mg at 04/18/23 0902   diltiazem (CARDIZEM CD) 24 hr capsule 180 mg  180 mg Oral Daily Shahira Fiske T, MD   180 mg at 04/18/23 1610   hydrOXYzine (ATARAX) tablet 25 mg  25 mg Oral TID PRN [name], [name], NP       latanoprost (XALATAN) 0.005 % ophthalmic solution 1 drop  1 drop Both Eyes QHS Akilah Cureton T, MD   1 drop at 04/17/23 2122   LORazepam  (ATIVAN) injection 2 mg  2 mg Intramuscular TID PRN [name], [name], NP       LORazepam (ATIVAN) tablet 1 mg  1 mg Oral Q4H PRN [name], DO       magnesium hydroxide (MILK OF MAGNESIA) suspension 30 mL  30 mL Oral Daily PRN [name], [name], NP       mirtazapine (REMERON) tablet 45 mg  45 mg Oral QHS Makylah Bossard T, MD   45 mg at 04/17/23 2121   mometasone-formoterol (DULERA) 200-5 MCG/ACT inhaler 2 puff  2 puff Inhalation BID Malik, Kashif   2 puff at 04/18/23 0902   OLANZapine (ZYPREXA) tablet 10 mg  10 mg Oral Q6H PRN [name], DO       traZODone (DESYREL) tablet 50 mg  50 mg Oral QHS PRN [name], [name], NP   50 mg at 04/15/23 2124    Lab Results: No results found for this or any previous visit (from the past 48 hour(s)).  Blood Alcohol level:  Lab Results  Component Value Date   ETH <10 04/05/2023   ETH <10 01/10/2023    Metabolic Disorder Labs: Lab Results  Component Value Date   HGBA1C 5.2 01/13/2023   MPG 103 01/13/2023   MPG 91.06 09/07/2020   No results found for: "PROLACTIN" Lab Results  Component Value Date   CHOL 168 01/13/2023   TRIG 62 01/13/2023   HDL 57 01/13/2023   CHOLHDL 2.9 01/13/2023   VLDL 12 01/13/2023   LDLCALC 99 01/13/2023   LDLCALC 104 (H) 05/17/2022    Physical Findings: AIMS:  , ,  ,  ,    CIWA:    COWS:     Musculoskeletal: Strength & Muscle Tone: within normal limits Gait & Station: normal Patient leans: N/A  Psychiatric Specialty Exam:  Presentation  General Appearance:  Appropriate for Environment; Casual  Eye Contact: Fair  Speech: Normal Rate  Speech Volume: Decreased  Handedness: Right   Mood and Affect  Mood: Depressed  Affect: Appropriate; Congruent   Thought Process  Thought Processes: Goal Directed; Coherent  Descriptions of Associations:Intact  Orientation:Full (Time, Place and Person)  Thought Content:Logical  History of  Schizophrenia/Schizoaffective disorder:No  Duration of Psychotic Symptoms:Less than six months  Hallucinations:No data recorded Ideas of Reference:None  Suicidal Thoughts:No data recorded Homicidal Thoughts:No data recorded  Sensorium  Memory: Immediate Fair; Remote Fair  Judgment: Fair  Insight: Fair   Art therapist  Concentration: Fair  Attention Span: Fair  Recall: [employer] of Knowledge: Fair  Language: Fair   Psychomotor Activity  Psychomotor Activity:No data recorded  Assets  Assets: Communication Skills; Desire for Improvement; Financial Resources/Insurance; Housing   Sleep  Sleep:No data recorded   Physical Exam: Physical Exam Constitutional:      Appearance: Normal appearance.  HENT:     Head: Normocephalic and atraumatic.     Mouth/Throat:     Pharynx: Oropharynx is clear.  Eyes:     Pupils: Pupils are equal, round, and reactive to light.  Cardiovascular:     Rate and Rhythm: Normal rate and regular rhythm.  Pulmonary:     Effort: Pulmonary effort is normal.     Breath sounds: Normal breath sounds.  Abdominal:     General: Abdomen is flat.     Palpations: Abdomen is soft.  Musculoskeletal:        General: Normal range of motion.  Skin:    General: Skin is warm and dry.  Neurological:     General: No focal deficit present.     Mental Status: He is alert. Mental status is at baseline.  Psychiatric:        Mood and Affect: Mood normal.        Thought Content: Thought content normal.  Review of Systems  Constitutional: Negative.   HENT: Negative.    Eyes: Negative.   Respiratory: Negative.    Cardiovascular: Negative.   Gastrointestinal: Negative.   Musculoskeletal: Negative.   Skin: Negative.   Neurological: Negative.   Psychiatric/Behavioral: Negative.     Blood pressure (!) 147/91, pulse 69, temperature 97.9 F (36.6 C), resp. rate 18, height 6\' 4"  (1.93 m), weight 123.4 kg, SpO2 97 %. Body mass index is  33.11 kg/m.   Treatment Plan Summary: Plan discharge tomorrow  Mordecai Rasmussen, MD 04/18/2023, 5:05 PM

## 2023-04-18 NOTE — Group Note (Signed)
Recreation Therapy Group Note   Group Topic:Stress Management  Group Date: 04/18/2023 Start Time: 1400 End Time: 1500 Facilitators: Rosina Lowenstein, LRT, CTRS Location: Dayroom  Group Description: Stress Charades. Patients and LRT discuss the emotion "stress" and things that are associated with it. LRT encourages pts to make their own list of things that stress them out on a piece of paper provided. LRT encourages pts to identify their top 5 stressors from original list and has pts write them on smaller cut slips of paper. LRT folds and places slips into a container. Pt pulls a random slip out of the container and acts it out. LRT and peers try to guess what the peer is acting out. After all slips of paper are acted out, LRT and pts will discuss different coping skills that help them when they're feeling stressed.   Goal Area(s) Addressed: Patient will identify things that make them feel stressed.  Patient will visualize stressors by acting them out.  Patient will recognize that others experience stress. Patient will become more self-aware of stressors. Patient will discuss and learn alternative coping strategies for handling stress.  Affect/Mood: Appropriate   Participation Level: Active and Engaged   Participation Quality: Independent   Behavior: Appropriate, Calm, and Cooperative   Speech/Thought Process: Coherent   Insight: Moderate   Judgement: Good   Modes of Intervention: Activity   Patient Response to Interventions:  Attentive, Engaged, Interested , and Receptive   Education Outcome:  Acknowledges education   Clinical Observations/Individualized Feedback: Jhovany was active in their participation of session activities and group discussion. Pt identified "alcohol, rude kids and pot head kids" as things that stress him out. Pt appropriately played multiple rounds of charades with peers. Pt interacted well with LRT and peers duration of session.    Plan: Continue to  engage patient in RT group sessions 2-3x/week.   Rosina Lowenstein, LRT, CTRS 04/18/2023 3:09 PM

## 2023-04-19 DIAGNOSIS — F332 Major depressive disorder, recurrent severe without psychotic features: Secondary | ICD-10-CM | POA: Diagnosis not present

## 2023-04-19 MED ORDER — ARIPIPRAZOLE 10 MG PO TABS: 10.0000 mg | ORAL_TABLET | Freq: Every day | ORAL | 1 refills | Status: DC

## 2023-04-19 MED ORDER — HYDROXYZINE HCL 25 MG PO TABS: 25.0000 mg | ORAL_TABLET | Freq: Three times a day (TID) | ORAL | 1 refills | Status: DC | PRN

## 2023-04-19 MED ORDER — MIRTAZAPINE 45 MG PO TABS: 45.0000 mg | ORAL_TABLET | Freq: Every day | ORAL | 1 refills | Status: DC

## 2023-04-19 MED ORDER — MOMETASONE FURO-FORMOTEROL FUM 200-5 MCG/ACT IN AERO: 2.0000 | INHALATION_SPRAY | Freq: Two times a day (BID) | RESPIRATORY_TRACT | 1 refills | Status: DC

## 2023-04-19 MED ORDER — ALBUTEROL SULFATE HFA 108 (90 BASE) MCG/ACT IN AERS: 2.0000 | INHALATION_SPRAY | Freq: Four times a day (QID) | RESPIRATORY_TRACT | 1 refills | Status: DC | PRN

## 2023-04-19 MED ORDER — TRAZODONE HCL 50 MG PO TABS: 50.0000 mg | ORAL_TABLET | Freq: Every evening | ORAL | 1 refills | Status: DC | PRN

## 2023-04-19 MED ORDER — DILTIAZEM HCL ER COATED BEADS 180 MG PO CP24: 180.0000 mg | ORAL_CAPSULE | Freq: Every day | ORAL | 1 refills | Status: DC

## 2023-04-19 MED ORDER — LATANOPROST 0.005 % OP SOLN: 1.0000 [drp] | Freq: Every day | OPHTHALMIC | 1 refills | Status: DC

## 2023-04-19 MED ORDER — ATORVASTATIN CALCIUM 80 MG PO TABS: 80.0000 mg | ORAL_TABLET | Freq: Every day | ORAL | 1 refills | Status: DC

## 2023-04-19 NOTE — Plan of Care (Signed)

## 2023-04-19 NOTE — [facility] Group Notes (Signed)
[facility] Group Notes:  (Nursing/MHT/Case Management/Adjunct)  Date:  04/19/2023  Time:  10:34 AM  Type of Therapy:   outside rec  Participation Level:  Did Not Attend    [name] 04/19/2023, 10:34 AM

## 2023-04-19 NOTE — Progress Notes (Signed)
Patient with sad affect.  Pleasant and cooperative with staff.  Patient states he is ready to discharge today.  Denies SI, HI and AVH.  Denies pain. Patient ate breakfast and then returned to his room to lay down.  Compliant with scheduled medications.  15 min checks in place for safety.

## 2023-04-19 NOTE — Progress Notes (Signed)
Patient was discharged @1240 , he was in good spirits, denied SI/HI, plan or intent and was escorted to the lobby by staff to be transported by [facility] and without incident.

## 2023-04-19 NOTE — Progress Notes (Signed)
  [facility] Adult Case Management Discharge Plan :  Will you be returning to the same living situation after discharge:  Yes,  pt plans to return home upon discharge. At discharge, do you have transportation home?: Yes,  CSW to coordinated transportation. Do you have the ability to pay for your medications: Yes,  [employer] Medicare.  Release of information consent forms completed and in the chart;  Patient's signature needed at discharge.  Patient to Follow up at:  Follow-up Information     [facility] Follow up.   Why: Your appointment is scheudled for 05/27/23 at 9AM. This is a virtual visit but you will need to come to the office to complete paperwork prior to appointment. You can walk-in to complete paperwork Monday-Thursday 8AM-12PM and 1pm-4pm (they are closed from 12-1PM for lunch). Thanks! Contact information: [address] [place] [address]                Next level of care provider has access to [facility] Link:no  Safety Planning and Suicide Prevention discussed: Yes,  SPE completed with pt.     Has patient been referred to the Quitline?: Patient refused referral for treatment  Patient has been referred for addiction treatment: Patient refused referral for treatment.  Glenis Smoker, LCSW 04/19/2023, 11:22 AM

## 2023-04-19 NOTE — Progress Notes (Signed)
Pt is calm and cooperative, denies SI HI AVH.  Pt is independent with self care and appropriately social in the milieu.  Pt declined mirtazapine even though he has had the same dose for several days.  He stated he only takes one, not three.  Despite explanation of MD order Pt did not want entire dose resulting in a refusal.  Pt said the medication makes him too sleepy and hard to wake up.  Continued monitoring for safety.   04/19/23 0000  Psych Admission Type (Psych Patients Only)  Admission Status Voluntary  Psychosocial Assessment  Patient Complaints None  Eye Contact Fair  Facial Expression Flat;Sad  Affect Sad  Speech Logical/coherent  Interaction Assertive  Motor Activity Slow  Appearance/Hygiene Unremarkable  Behavior Characteristics Cooperative;Appropriate to situation  Mood Pleasant  Thought Process  Coherency WDL  Content WDL  Delusions None reported or observed  Perception WDL  Hallucination None reported or observed  Judgment WDL  Confusion None  Danger to Self  Current suicidal ideation? Denies  Danger to Others  Danger to Others None reported or observed

## 2023-04-19 NOTE — [facility] Suicide Risk Assessment (Signed)
[facility] Discharge Suicide Risk Assessment   Principal Problem: MDD (major depressive disorder), recurrent severe, without psychosis (HCC) Discharge Diagnoses: Principal Problem:   MDD (major depressive disorder), recurrent severe, without psychosis (HCC)   Total Time spent with patient: 30 minutes  Musculoskeletal: Strength & Muscle Tone: within normal limits Gait & Station: normal Patient leans: N/A  Psychiatric Specialty Exam  Presentation  General Appearance:  Appropriate for Environment; Casual  Eye Contact: Fair  Speech: Normal Rate  Speech Volume: Decreased  Handedness: Right   Mood and Affect  Mood: Depressed  Duration of Depression Symptoms: Less than two weeks  Affect: Appropriate; Congruent   Thought Process  Thought Processes: Goal Directed; Coherent  Descriptions of Associations:Intact  Orientation:Full (Time, Place and Person)  Thought Content:Logical  History of Schizophrenia/Schizoaffective disorder:No  Duration of Psychotic Symptoms:Less than six months  Hallucinations:No data recorded Ideas of Reference:None  Suicidal Thoughts:No data recorded Homicidal Thoughts:No data recorded  Sensorium  Memory: Immediate Fair; Remote Fair  Judgment: Fair  Insight: Fair   Art therapist  Concentration: Fair  Attention Span: Fair  Recall: [employer] of Knowledge: Fair  Language: Fair   Psychomotor Activity  Psychomotor Activity:No data recorded  Assets  Assets: Communication Skills; Desire for Improvement; Financial Resources/Insurance; Housing   Sleep  Sleep:No data recorded  Physical Exam: Physical Exam Vitals reviewed.  Constitutional:      Appearance: Normal appearance.  HENT:     Head: Normocephalic and atraumatic.     Mouth/Throat:     Pharynx: Oropharynx is clear.  Eyes:     Pupils: Pupils are equal, round, and reactive to light.  Cardiovascular:     Rate and Rhythm: Normal rate and regular  rhythm.  Pulmonary:     Effort: Pulmonary effort is normal.     Breath sounds: Normal breath sounds.  Abdominal:     General: Abdomen is flat.     Palpations: Abdomen is soft.  Musculoskeletal:        General: Normal range of motion.  Skin:    General: Skin is warm and dry.  Neurological:     General: No focal deficit present.     Mental Status: He is alert. Mental status is at baseline.  Psychiatric:        Attention and Perception: Attention normal.        Mood and Affect: Mood normal.        Speech: Speech normal.        Behavior: Behavior normal.        Thought Content: Thought content normal.        Cognition and Memory: Cognition normal.        Judgment: Judgment normal.    Review of Systems  Constitutional: Negative.   HENT: Negative.    Eyes: Negative.   Respiratory: Negative.    Cardiovascular: Negative.   Gastrointestinal: Negative.   Musculoskeletal: Negative.   Skin: Negative.   Neurological: Negative.   Psychiatric/Behavioral: Negative.     Blood pressure 122/70, pulse 72, temperature 98.6 F (37 C), temperature source Oral, resp. rate 17, height 6\' 4"  (1.93 m), weight 123.4 kg, SpO2 99 %. Body mass index is 33.11 kg/m.  Mental Status Per Nursing Assessment::   On Admission:  Suicidal ideation indicated by patient  Demographic Factors:  Male and Age 69 or older  Loss Factors: NA  Historical Factors: NA  Risk Reduction Factors:   Positive therapeutic relationship  Continued Clinical Symptoms:  Depression:   Anhedonia  Cognitive  Features That Contribute To Risk:  None    Suicide Risk:  Minimal: No identifiable suicidal ideation.  Patients presenting with no risk factors but with morbid ruminations; may be classified as minimal risk based on the severity of the depressive symptoms    Plan Of Care/Follow-up recommendations:  Other:  Patient not currently reporting any suicidal ideation.  Affect euthymic.  Sleep is stabilized.  Medically  stabilized.  Agrees to outpatient follow-up discharge plan.  No sign of acute dangerousness at discharge.  Mordecai Rasmussen, MD 04/19/2023, 9:40 AM

## 2023-04-19 NOTE — Discharge Summary (Signed)
Physician Discharge Summary Note  Patient:  Robert Chan is an 67 y.o., male MRN:  [id number] DOB:  07-09-1956 Patient phone:  [phone] (home)  Patient address:   [address] [place] [address],  Total Time spent with patient: 30 minutes  Date of Admission:  04/06/2023 Date of Discharge: 04/19/2023  Reason for Admission: Admitted with depression multiple medical problems some suicidal ideation.  Principal Problem: MDD (major depressive disorder), recurrent severe, without psychosis (HCC) Discharge Diagnoses: Principal Problem:   MDD (major depressive disorder), recurrent severe, without psychosis (HCC)   Past Psychiatric History: Past history of depression and cocaine use multiple chronic medical problems  Past Medical History:  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   Depression    Elevated serum creatinine 08/28/2019   Homelessness 06/19/2020   Hypertension    NSTEMI (non-ST elevated myocardial infarction) [facility])    Status post incision and drainage 04/22/2021   Suicidal ideation 06/05/2020    Past Surgical History:  Procedure Laterality Date   APPENDECTOMY     EYE SURGERY     LEFT HEART CATH AND CORONARY ANGIOGRAPHY N/A 06/05/2020   Procedure: LEFT HEART CATH AND CORONARY ANGIOGRAPHY;  Surgeon: Elder Negus, MD;  Location: [facility];  Service: Cardiovascular;  Laterality: N/A;   PROSTATE SURGERY     Family History:  Family History  Problem Relation Age of Onset   Hypertension Mother    Diabetes Mother    Hypertension Father    Hypertension Sister    Family Psychiatric  History: See previous Social History:  Social History   Substance and Sexual  Activity  Alcohol Use Yes   Comment: drinks 2x/wk, 2-3 40 oz drinks, last drink 2 weeks ago     Social History   Substance and Sexual Activity  Drug Use Not Currently   Types: Cocaine   Comment: cocaine use once a month, last use 6 mos ago    Social History   Socioeconomic History   Marital status: Single    Spouse name: Not on file   Number of children: Not on file   Years of education: Not on file   Highest education level: Not on file  Occupational History   Not on file  Tobacco Use   Smoking status: Some Days    Types: Cigarettes    Last attempt to quit: 12/25/2020    Years since quitting: 2.3   Smokeless tobacco: Never   Tobacco comments:    Smokes a couple cigarettes every now and then  Vaping Use   Vaping Use: Never used  Substance and Sexual Activity   Alcohol use: Yes    Comment: drinks 2x/wk, 2-3 40 oz drinks, last drink 2 weeks ago   Drug use: Not Currently    Types: Cocaine    Comment: cocaine use once a month, last use 6 mos ago   Sexual activity: Not Currently  Other Topics Concern   Not on file  Social History Narrative   His mother passed at age 74 (when he was 41 years old) and his father raised him and his siblings   His parents were pastors as a lot  of his sisters and other family members are today   There was a total of 13 children in his family Had 16 sisters, one sister deceased , Had 3 brothers, all brothers deceased   He is the youngest of the 40 and was "always in trouble in my younger years"   Family still live in [place], [place], some in [place], [place]   Has a Daughter and son with a total of 10 grandchildren (6 grand daughters local and 4 grandsons)    Values family   Married twice, present wife incarcerated   Social Determinants of Health   Financial Resource Strain: Low Risk  (10/19/2022)   Overall Financial Resource Strain (CARDIA)    Difficulty of Paying Living Expenses: Not hard at all  Food Insecurity: No Food Insecurity  (04/06/2023)   Hunger Vital Sign    Worried About Running Out of Food in the Last Year: Never true    Ran Out of Food in the Last Year: Never true  Transportation Needs: No Transportation Needs (04/06/2023)   PRAPARE - Administrator, Civil Service (Medical): No    Lack of Transportation (Non-Medical): No  Physical Activity: Inactive (10/19/2022)   Exercise Vital Sign    Days of Exercise per Week: 0 days    Minutes of Exercise per Session: 0 min  Stress: No Stress Concern Present (10/19/2022)   [name] of Occupational Health - Occupational Stress Questionnaire    Feeling of Stress : Not at all  Social Connections: Moderately Integrated (03/31/2022)   Social Connection and Isolation Panel [NHANES]    Frequency of Communication with Friends and Family: Three times a week    Frequency of Social Gatherings with Friends and Family: Three times a week    Attends Religious Services: 1 to 4 times per year    Active Member of Clubs or Organizations: Yes    Attends Banker Meetings: 1 to 4 times per year    Marital Status: Never married    Hospital Course: Admitted to psychiatric hospital geriatric unit.  15-minute checks continued.  Patient did not display any dangerous behavior on the unit.  No violence no suicidal ideation expressed other than passive thoughts.  Patient's medicines were adjusted.  Started on antidepressants.  Medical issues were managed by adjustment of antihypertensives.  Patient engaged regularly in therapeutic activities.  At the time of discharge he reports that his mood is much improved.  Denies any suicidal ideation.  Able to articulate positive upbeat plans for the future.  Counseled to stay away from drugs and alcohol and to engage with outpatient mental health follow-up.  Prescriptions provided.  Physical Findings: AIMS:  , ,  ,  ,    CIWA:    COWS:     Musculoskeletal: Strength & Muscle Tone: within normal limits Gait & Station:  normal Patient leans: N/A   Psychiatric Specialty Exam:  Presentation  General Appearance:  Appropriate for Environment; Casual  Eye Contact: Fair  Speech: Normal Rate  Speech Volume: Decreased  Handedness: Right   Mood and Affect  Mood: Depressed  Affect: Appropriate; Congruent   Thought Process  Thought Processes: Goal Directed; Coherent  Descriptions of Associations:Intact  Orientation:Full (Time, Place and Person)  Thought Content:Logical  History of Schizophrenia/Schizoaffective disorder:No  Duration of Psychotic Symptoms:Less than six months  Hallucinations:No data recorded Ideas of Reference:None  Suicidal Thoughts:No data recorded Homicidal Thoughts:No data recorded  Sensorium  Memory: Immediate Fair; Remote Fair  Judgment: Fair  Insight: Fair   Chartered certified accountant: Fair  Attention Span: Fair  Recall: Jennelle Human of Knowledge: Fair  Language: Fair   Psychomotor Activity  Psychomotor Activity:No data recorded  Assets  Assets: Manufacturing systems engineer; Desire for Improvement; Financial Resources/Insurance; Housing   Sleep  Sleep:No data recorded   Physical Exam: Physical Exam Vitals and nursing note reviewed.  Constitutional:      Appearance: Normal appearance.  HENT:     Head: Normocephalic and atraumatic.     Mouth/Throat:     Pharynx: Oropharynx is clear.  Eyes:     Pupils: Pupils are equal, round, and reactive to light.  Cardiovascular:     Rate and Rhythm: Normal rate and regular rhythm.  Pulmonary:     Effort: Pulmonary effort is normal.     Breath sounds: Normal breath sounds.  Abdominal:     General: Abdomen is flat.     Palpations: Abdomen is soft.  Musculoskeletal:        General: Normal range of motion.  Skin:    General: Skin is warm and dry.  Neurological:     General: No focal deficit present.     Mental Status: He is alert. Mental status is at baseline.  Psychiatric:         Attention and Perception: Attention normal.        Mood and Affect: Mood normal.        Speech: Speech normal.        Behavior: Behavior normal.        Thought Content: Thought content normal.        Cognition and Memory: Cognition normal.    Review of Systems  Constitutional: Negative.   HENT: Negative.    Eyes: Negative.   Respiratory: Negative.    Cardiovascular: Negative.   Gastrointestinal: Negative.   Musculoskeletal: Negative.   Skin: Negative.   Neurological: Negative.   Psychiatric/Behavioral: Negative.     Blood pressure 122/70, pulse 72, temperature 98.6 F (37 C), temperature source Oral, resp. rate 17, height 6\' 4"  (1.93 m), weight 123.4 kg, SpO2 99 %. Body mass index is 33.11 kg/m.   Social History   Tobacco Use  Smoking Status Some Days   Types: Cigarettes   Last attempt to quit: 12/25/2020   Years since quitting: 2.3  Smokeless Tobacco Never  Tobacco Comments   Smokes a couple cigarettes every now and then   Tobacco Cessation:  A prescription for an FDA-approved tobacco cessation medication was offered at discharge and the patient refused   Blood Alcohol level:  Lab Results  Component Value Date   ETH <10 04/05/2023   ETH <10 01/10/2023    Metabolic Disorder Labs:  Lab Results  Component Value Date   HGBA1C 5.2 01/13/2023   MPG 103 01/13/2023   MPG 91.06 09/07/2020   No results found for: "PROLACTIN" Lab Results  Component Value Date   CHOL 168 01/13/2023   TRIG 62 01/13/2023   HDL 57 01/13/2023   CHOLHDL 2.9 01/13/2023   VLDL 12 01/13/2023   LDLCALC 99 01/13/2023   LDLCALC 104 (H) 05/17/2022    See Psychiatric Specialty Exam and Suicide Risk Assessment completed by Attending Physician prior to discharge.  Discharge destination:  Home  Is patient on multiple antipsychotic therapies at discharge:  No   Has Patient had three or more failed trials of antipsychotic monotherapy by history:  No  Recommended Plan for Multiple  Antipsychotic Therapies: NA  Discharge Instructions  Diet - low sodium heart healthy   Complete by: As directed    Increase activity slowly   Complete by: As directed       Allergies as of 04/19/2023       Reactions   Shellfish Allergy Hives, Rash   Shellfish-derived Products Rash   Other reaction(s): Other (See Comments)        Medication List     STOP taking these medications    budesonide-formoterol 160-4.5 MCG/ACT inhaler Commonly known as: SYMBICORT   doxepin 50 MG capsule Commonly known as: SINEQUAN       TAKE these medications      Indication  albuterol 108 (90 Base) MCG/ACT inhaler Commonly known as: VENTOLIN HFA Inhale 2 puffs into the lungs every 6 (six) hours as needed for shortness of breath. What changed:  how much to take reasons to take this  Indication: Asthma   ARIPiprazole 10 MG tablet Commonly known as: ABILIFY Take 1 tablet (10 mg total) by mouth daily after breakfast. Start taking on: April 20, 2023 What changed:  medication strength how much to take when to take this  Indication: Major Depressive Disorder   atorvastatin 80 MG tablet Commonly known as: LIPITOR Take 1 tablet (80 mg total) by mouth daily.  Indication: High Amount of Fats in the Blood   diltiazem 180 MG 24 hr capsule Commonly known as: CARDIZEM CD Take 1 capsule (180 mg total) by mouth daily. Start taking on: April 20, 2023 What changed:  medication strength how much to take  Indication: High Blood Pressure Disorder   hydrOXYzine 25 MG tablet Commonly known as: ATARAX Take 1 tablet (25 mg total) by mouth 3 (three) times daily as needed for anxiety.  Indication: Feeling Anxious   latanoprost 0.005 % ophthalmic solution Commonly known as: XALATAN Place 1 drop into both eyes at bedtime.  Indication: Glaucoma   mirtazapine 45 MG tablet Commonly known as: REMERON Take 1 tablet (45 mg total) by mouth at bedtime. What changed:  medication strength how  much to take  Indication: Major Depressive Disorder   mometasone-formoterol 200-5 MCG/ACT Aero Commonly known as: DULERA Inhale 2 puffs into the lungs 2 (two) times daily.  Indication: Chronic Obstructive Lung Disease   traZODone 50 MG tablet Commonly known as: DESYREL Take 1 tablet (50 mg total) by mouth at bedtime as needed for sleep.  Indication: Trouble Sleeping         Follow-up recommendations:  Other:  Recommend follow-up with local mental health agencies while continuing current medicine and following up with primary care doctor  Comments: See above  Signed: Mordecai Rasmussen, MD 04/19/2023, 9:43 AM

## 2023-04-19 NOTE — Care Management Important Message (Signed)
Important Message  Patient Details  Name: Robert Chan MRN: [id number] Date of Birth: 1956-05-03   Medicare Important Message Given:  Yes  IM reviewed with pt and he declined interest in appealing discharge.   Glenis Smoker, LCSW 04/19/2023, 9:49 AM

## 2023-04-20 ENCOUNTER — Telehealth: Payer: Self-pay

## 2023-04-20 NOTE — Transitions of Care (Post Inpatient/ED Visit) (Signed)
   04/20/2023  Name: Jode Lay MRN: [id number] DOB: Nov 08, 1955  Today's TOC FU Call Status: Today's TOC FU Call Status:: Unsuccessul Call (1st Attempt) Unsuccessful Call (1st Attempt) Date: 04/20/23  Attempted to reach the patient regarding the most recent Inpatient/ED visit.  Follow Up Plan: Additional outreach attempts will be made to reach the patient to complete the Transitions of Care (Post Inpatient/ED visit) call.     Antionette Fairy, RN,BSN,CCM [facility] Care Management Care Management Community Coordinator Direct Phone: [phone] Toll Free: [phone] Fax: [phone]

## 2023-04-21 ENCOUNTER — Telehealth: Payer: Self-pay

## 2023-04-21 NOTE — Transitions of Care (Post Inpatient/ED Visit) (Signed)
   04/21/2023  Name: Robert Chan MRN: [id number] DOB: May 21, 1956  Today's TOC FU Call Status:    Attempted to reach the patient regarding the most recent Inpatient/ED visit.  Follow Up Plan: Additional outreach attempts will be made to reach the patient to complete the Transitions of Care (Post Inpatient/ED visit) call.      Antionette Fairy, RN,BSN,CCM [facility] Care Management Care Management Community Coordinator Direct Phone: [phone] Toll Free: [phone] Fax: [phone]

## 2023-04-21 NOTE — Transitions of Care (Post Inpatient/ED Visit) (Signed)
   04/21/2023  Name: Robert Chan MRN: [id number] DOB: [birth year]  Today's TOC FU Call Status: Today's TOC FU Call Status:: Unsuccessful Call (3rd Attempt) Unsuccessful Call (3rd Attempt) Date: 04/21/23  Attempted to reach the patient regarding the most recent Inpatient/ED visit.  Follow Up Plan: No further outreach attempts will be made at this time. We have been unable to contact the patient.    Antionette Fairy, RN,BSN,CCM [facility] Care Management Care Management Community Coordinator Direct Phone: [phone] Toll Free: [phone] Fax: [phone]

## 2023-05-02 ENCOUNTER — Encounter: Payer: Self-pay | Admitting: Podiatry

## 2023-05-02 ENCOUNTER — Ambulatory Visit (INDEPENDENT_AMBULATORY_CARE_PROVIDER_SITE_OTHER): Payer: Medicare HMO | Admitting: Podiatry

## 2023-05-02 DIAGNOSIS — M79675 Pain in left toe(s): Secondary | ICD-10-CM

## 2023-05-02 DIAGNOSIS — L989 Disorder of the skin and subcutaneous tissue, unspecified: Secondary | ICD-10-CM

## 2023-05-02 DIAGNOSIS — B351 Tinea unguium: Secondary | ICD-10-CM | POA: Diagnosis not present

## 2023-05-02 DIAGNOSIS — M79674 Pain in right toe(s): Secondary | ICD-10-CM

## 2023-05-02 NOTE — Progress Notes (Signed)
This patient returns to my office for at risk foot care.  This patient requires this care by a professional since this patient will be at risk due to having PAD and CKD..    This patient is unable to cut nails himself since the patient cannot reach his nails.These nails are painful walking and wearing shoes. Patient also has painful callus on the outside on bottom of both feet. This patient presents for at risk foot care today.  General Appearance  Alert, conversant and in no acute stress.  Vascular  Dorsalis pedis and posterior tibial  pulses are palpable  bilaterally.  Capillary return is within normal limits  bilaterally. Temperature is within normal limits  bilaterally.  Neurologic  Senn-Weinstein monofilament wire test within normal limits  bilaterally. Muscle power within normal limits bilaterally.  Nails Thick disfigured discolored nails with subungual debris  from hallux to fifth toes bilaterally. No evidence of bacterial infection or drainage bilaterally.  Orthopedic  No limitations of motion  feet .  No crepitus or effusions noted.  No bony pathology or digital deformities noted. Plantar flexed fifth metatarsals  B/L.  Skin  normotropic skin with no porokeratosis noted bilaterally.  No signs of infections or ulcers noted.   Callus sub 5th met both feet.  Onychomycosis  Pain in right toes  Pain in left toes  Callus  B/L.  Consent was obtained for treatment procedures.   Mechanical debridement of nails 1-5  bilaterally performed with a nail nipper.  Filed with dremel without incident. Debride callus with # 15 blade and dremel tool.   Return office visit   10 weeks                  Told patient to return for periodic foot care and evaluation due to potential at risk complications.   Helane Gunther DPM

## 2023-05-24 ENCOUNTER — Emergency Department (HOSPITAL_COMMUNITY)
Admission: EM | Admit: 2023-05-24 | Discharge: 2023-05-25 | Disposition: A | Discharge status: Home / Self Care | Payer: Medicare HMO | Attending: Emergency Medicine | Admitting: Emergency Medicine

## 2023-05-24 ENCOUNTER — Other Ambulatory Visit: Payer: Self-pay

## 2023-05-24 ENCOUNTER — Emergency Department (HOSPITAL_COMMUNITY): Payer: Medicare HMO

## 2023-05-24 DIAGNOSIS — I129 Hypertensive chronic kidney disease with stage 1 through stage 4 chronic kidney disease, or unspecified chronic kidney disease: Secondary | ICD-10-CM | POA: Insufficient documentation

## 2023-05-24 DIAGNOSIS — J449 Chronic obstructive pulmonary disease, unspecified: Secondary | ICD-10-CM | POA: Insufficient documentation

## 2023-05-24 DIAGNOSIS — F1410 Cocaine abuse, uncomplicated: Secondary | ICD-10-CM | POA: Diagnosis not present

## 2023-05-24 DIAGNOSIS — Z79899 Other long term (current) drug therapy: Secondary | ICD-10-CM | POA: Insufficient documentation

## 2023-05-24 DIAGNOSIS — Z5900 Homelessness unspecified: Secondary | ICD-10-CM | POA: Diagnosis not present

## 2023-05-24 DIAGNOSIS — R0789 Other chest pain: Secondary | ICD-10-CM | POA: Diagnosis not present

## 2023-05-24 DIAGNOSIS — F1490 Cocaine use, unspecified, uncomplicated: Secondary | ICD-10-CM

## 2023-05-24 DIAGNOSIS — I2510 Atherosclerotic heart disease of native coronary artery without angina pectoris: Secondary | ICD-10-CM | POA: Diagnosis not present

## 2023-05-24 DIAGNOSIS — N189 Chronic kidney disease, unspecified: Secondary | ICD-10-CM | POA: Diagnosis not present

## 2023-05-24 DIAGNOSIS — R45851 Suicidal ideations: Secondary | ICD-10-CM | POA: Diagnosis not present

## 2023-05-24 DIAGNOSIS — R45850 Homicidal ideations: Secondary | ICD-10-CM | POA: Diagnosis not present

## 2023-05-24 DIAGNOSIS — F323 Major depressive disorder, single episode, severe with psychotic features: Secondary | ICD-10-CM | POA: Diagnosis not present

## 2023-05-24 DIAGNOSIS — F17210 Nicotine dependence, cigarettes, uncomplicated: Secondary | ICD-10-CM | POA: Diagnosis not present

## 2023-05-24 DIAGNOSIS — F333 Major depressive disorder, recurrent, severe with psychotic symptoms: Secondary | ICD-10-CM | POA: Diagnosis not present

## 2023-05-24 DIAGNOSIS — R079 Chest pain, unspecified: Secondary | ICD-10-CM | POA: Diagnosis not present

## 2023-05-24 LAB — COMPREHENSIVE METABOLIC PANEL
ALT: 14 U/L (ref 0–44)
AST: 19 U/L (ref 15–41)
Albumin: 3.6 g/dL (ref 3.5–5.0)
Alkaline Phosphatase: 62 U/L (ref 38–126)
Anion gap: 13 (ref 5–15)
BUN: 7 mg/dL — ABNORMAL LOW (ref 8–23)
CO2: 21 mmol/L — ABNORMAL LOW (ref 22–32)
Calcium: 8.7 mg/dL — ABNORMAL LOW (ref 8.9–10.3)
Chloride: 105 mmol/L (ref 98–111)
Creatinine, Ser: 1.1 mg/dL (ref 0.61–1.24)
GFR, Estimated: >60 mL/min (ref >60)
Glucose, Bld: 117 mg/dL — ABNORMAL HIGH (ref 70–99)
Potassium: 3.5 mmol/L (ref 3.5–5.1)
Sodium: 139 mmol/L (ref 135–145)
Total Bilirubin: 0.7 mg/dL (ref 0.3–1.2)
Total Protein: 6.9 g/dL (ref 6.5–8.1)

## 2023-05-24 LAB — CBC
HCT: 38.3 % — ABNORMAL LOW (ref 39.0–52.0)
Hemoglobin: 12.5 g/dL — ABNORMAL LOW (ref 13.0–17.0)
MCH: 31.2 pg (ref 26.0–34.0)
MCHC: 32.6 g/dL (ref 30.0–36.0)
MCV: 95.5 fL (ref 80.0–100.0)
Platelets: 255 10*3/uL (ref 150–400)
RBC: 4.01 MIL/uL — ABNORMAL LOW (ref 4.22–5.81)
RDW: 12.7 % (ref 11.5–15.5)
WBC: 7.7 10*3/uL (ref 4.0–10.5)
nRBC: 0 % (ref 0.0–0.2)

## 2023-05-24 LAB — ETHANOL: Alcohol, Ethyl (B): 18 mg/dL — ABNORMAL HIGH (ref <10)

## 2023-05-24 LAB — SALICYLATE LEVEL: Salicylate Lvl: <7 mg/dL — ABNORMAL LOW (ref 7.0–30.0)

## 2023-05-24 LAB — TROPONIN I (HIGH SENSITIVITY)
Troponin I (High Sensitivity): 14 ng/L (ref <18)
Troponin I (High Sensitivity): 13 ng/L (ref <18)

## 2023-05-24 LAB — RAPID URINE DRUG SCREEN, HOSP PERFORMED
Amphetamines: NOT DETECTED
Barbiturates: NOT DETECTED
Benzodiazepines: NOT DETECTED
Cocaine: POSITIVE — AB
Opiates: NOT DETECTED
Tetrahydrocannabinol: NOT DETECTED

## 2023-05-24 LAB — ACETAMINOPHEN LEVEL: Acetaminophen (Tylenol), Serum: <10 ug/mL — ABNORMAL LOW (ref 10–30)

## 2023-05-24 NOTE — ED Provider Notes (Signed)
[place] EMERGENCY DEPARTMENT AT [facility] Provider Note   CSN: [id number] Arrival date & time: 05/24/23  1909     History  Chief Complaint  Patient presents with   Suicidal   Chest Pain    Nycere Presley is a 67 y.o. male.  Patient with history of COPD, hypertension, chronic kidney disease, cocaine use, history of housing instability, nonobstructive coronary artery disease noted on catheterization in 2021--presents for evaluation of chest pain as well as suicidal ideations.  Patient tells me he has had a "discomfort" described as a pressure on his chest that occurs intermittently over the past several days.  No exertional pain or radiation of pain.  No associated vomiting or diaphoresis.  Eating and drinking does not make it worse.  He also reports continued suicidal ideations.  He does not report a specific plan.  He states that he feels his medications, are not currently working well for him.       Home Medications Prior to Admission medications   Medication Sig Start Date End Date Taking? Authorizing Provider  albuterol (VENTOLIN HFA) 108 (90 Base) MCG/ACT inhaler Inhale 2 puffs into the lungs every 6 (six) hours as needed for shortness of breath. 04/19/23   Clapacs, Jackquline Denmark, MD  ARIPiprazole (ABILIFY) 10 MG tablet Take 1 tablet (10 mg total) by mouth daily after breakfast. 04/20/23   Clapacs, Jackquline Denmark, MD  atorvastatin (LIPITOR) 80 MG tablet Take 1 tablet (80 mg total) by mouth daily. 04/19/23 04/19/24  Clapacs, Jackquline Denmark, MD  diltiazem (CARDIZEM CD) 180 MG 24 hr capsule Take 1 capsule (180 mg total) by mouth daily. 04/20/23   Clapacs, Jackquline Denmark, MD  hydrOXYzine (ATARAX) 25 MG tablet Take 1 tablet (25 mg total) by mouth 3 (three) times daily as needed for anxiety. 04/19/23   Clapacs, Jackquline Denmark, MD  latanoprost (XALATAN) 0.005 % ophthalmic solution Place 1 drop into both eyes at bedtime. 04/19/23   Clapacs, Jackquline Denmark, MD  mirtazapine (REMERON) 45 MG tablet Take 1 tablet (45 mg total) by  mouth at bedtime. 04/19/23   Clapacs, Jackquline Denmark, MD  mometasone-formoterol (DULERA) 200-5 MCG/ACT AERO Inhale 2 puffs into the lungs 2 (two) times daily. 04/19/23   Clapacs, Jackquline Denmark, MD  traZODone (DESYREL) 50 MG tablet Take 1 tablet (50 mg total) by mouth at bedtime as needed for sleep. 04/19/23   Clapacs, Jackquline Denmark, MD      Allergies    Shellfish allergy and Shellfish-derived products    Review of Systems   Review of Systems  Physical Exam Updated Vital Signs BP 120/78   Pulse 81   Temp 98.2 F (36.8 C) (Oral)   Resp 18   SpO2 98%   Physical Exam Vitals and nursing note reviewed.  Constitutional:      General: He is not in acute distress.    Appearance: He is well-developed.  HENT:     Head: Normocephalic and atraumatic.  Eyes:     General:        Right eye: No discharge.        Left eye: No discharge.     Conjunctiva/sclera: Conjunctivae normal.  Cardiovascular:     Rate and Rhythm: Normal rate and regular rhythm.     Heart sounds: Normal heart sounds.  Pulmonary:     Effort: Pulmonary effort is normal.     Breath sounds: Normal breath sounds.  Abdominal:     Palpations: Abdomen is soft.     Tenderness:  There is no abdominal tenderness.  Musculoskeletal:     Cervical back: Normal range of motion and neck supple.  Skin:    General: Skin is warm and dry.  Neurological:     Mental Status: He is alert.  Psychiatric:        Attention and Perception: Attention normal.        Mood and Affect: Mood is depressed.        Speech: Speech normal.        Behavior: Behavior normal.        Thought Content: Thought content includes suicidal ideation. Thought content does not include homicidal ideation. Thought content does not include homicidal or suicidal plan.     ED Results / Procedures / Treatments   Labs (all labs ordered are listed, but only abnormal results are displayed) Labs Reviewed  COMPREHENSIVE METABOLIC PANEL - Abnormal; Notable for the following components:       Result Value   CO2 21 (*)    Glucose, Bld 117 (*)    BUN 7 (*)    Calcium 8.7 (*)    All other components within normal limits  ETHANOL - Abnormal; Notable for the following components:   Alcohol, Ethyl (B) 18 (*)    All other components within normal limits  SALICYLATE LEVEL - Abnormal; Notable for the following components:   Salicylate Lvl <7.0 (*)    All other components within normal limits  ACETAMINOPHEN LEVEL - Abnormal; Notable for the following components:   Acetaminophen (Tylenol), Serum <10 (*)    All other components within normal limits  CBC - Abnormal; Notable for the following components:   RBC 4.01 (*)    Hemoglobin 12.5 (*)    HCT 38.3 (*)    All other components within normal limits  RAPID URINE DRUG SCREEN, HOSP PERFORMED - Abnormal; Notable for the following components:   Cocaine POSITIVE (*)    All other components within normal limits  TROPONIN I (HIGH SENSITIVITY)  TROPONIN I (HIGH SENSITIVITY)    ED ECG REPORT   Date: 05/24/2023  Rate: 79  Rhythm: normal sinus rhythm  QRS Axis: normal  Intervals: normal  ST/T Wave abnormalities: nonspecific ST/T changes  Conduction Disutrbances:none  Narrative Interpretation:   Old EKG Reviewed: unchanged, t-waves more prominent tonight compared to 6/24, more similar today to 3/24  I have personally reviewed the EKG tracing and agree with the computerized printout as noted.   Radiology DG Chest 2 View  Result Date: 05/24/2023 CLINICAL DATA:  Chest pain. EXAM: CHEST - 2 VIEW COMPARISON:  Chest radiograph dated 04/05/2023. FINDINGS: No focal consolidation, pleural effusion, or pneumothorax. The cardiac silhouette is within normal limits. No acute osseous pathology. IMPRESSION: No active cardiopulmonary disease. Electronically Signed   By: Elgie Collard M.D.   On: 05/24/2023 20:37    Procedures Procedures    Medications Ordered in ED Medications - No data to display  ED Course/ Medical Decision Making/  A&P    Patient seen and examined. History obtained directly from patient. Work-up including labs, imaging, EKG ordered in triage, if performed, were reviewed.    Labs/EKG: Independently reviewed and interpreted.  This included: CBC shows mild anemia otherwise unremarkable; CMP largely unremarkable; ethanol minimally elevated at 18; UDS positive for cocaine otherwise negative; troponin normal at 14; acetaminophen and salicylate levels are negative.  Imaging: Independently visualized and interpreted.  This included: Chest x-ray agree negative.  Medications/Fluids: Ordered: None ordered   Most recent vital signs reviewed and  are as follows: BP 120/78   Pulse 81   Temp 98.2 F (36.8 C) (Oral)   Resp 18   SpO2 98%   Initial impression: Vague nonspecific chest pain, suicidal ideation.  Will await second troponin and repeat EKG to ensure stability, otherwise medically cleared from a chest pain standpoint.  Patient had reassuring cath several years ago without left main or proximal LAD disease.  10:41 PM   Labs personally reviewed and interpreted including: Troponin 14 >> 13.   ED ECG REPORT   Date: 05/24/2023  Rate: 60  Rhythm: normal sinus rhythm  QRS Axis: normal  Intervals: normal  ST/T Wave abnormalities: nonspecific ST/T changes  Conduction Disutrbances:none  Narrative Interpretation:   Old EKG Reviewed: unchanged  I have personally reviewed the EKG tracing and agree with the computerized printout as noted.  Most current vital signs reviewed and are as follows: BP 120/78   Pulse 75   Temp 98.4 F (36.9 C) (Oral)   Resp 18   SpO2 98%   Plan: TTS consult, patient is medically cleared at this time from a chest pain standpoint.                             Medical Decision Making Amount and/or Complexity of Data Reviewed Labs: ordered. Radiology: ordered.   For this patient's complaint of chest pain, the following emergent conditions were considered on the  differential diagnosis: acute coronary syndrome, pulmonary embolism, pneumothorax, myocarditis, pericardial tamponade, aortic dissection, thoracic aortic aneurysm complication, esophageal perforation.   Other causes were also considered including: gastroesophageal reflux disease, musculoskeletal pain including costochondritis, pneumonia/pleurisy, herpes zoster, pericarditis.  In regards to possibility of ACS, patient has atypical features of pain, unchanged EKG and negative troponins. Heart score was calculated to be 4. Previous work-ups included mild non-obstructive CAD (25-49%) on CT coronary in 01/2022 and LHC 8/21 with LM: Normal LAD: Prox 30% stenosis. Mid LAD myocardial bridge Slow flow improved with IC NTG LCx: Normal RCA: Mid 20% disease  In regards to possibility of PE, symptoms are atypical for PE and risk profile is low, making PE low likelihood.          Final Clinical Impression(s) / ED Diagnoses Final diagnoses:  Suicidal ideation  Atypical chest pain    Rx / DC Orders ED Discharge Orders     None         Renne Crigler, Cordelia Poche 05/24/23 2249    Ernie Avena, MD 05/24/23 2329

## 2023-05-24 NOTE — ED Notes (Signed)
Pt has been wanded by security. 

## 2023-05-24 NOTE — ED Triage Notes (Signed)
Patient reports suicidal ideation also reported intermittent chronic central chest pain for several months , no hallucinations , he did not disclose his plan of suicide .

## 2023-05-25 DIAGNOSIS — F1490 Cocaine use, unspecified, uncomplicated: Secondary | ICD-10-CM

## 2023-05-25 DIAGNOSIS — F332 Major depressive disorder, recurrent severe without psychotic features: Secondary | ICD-10-CM | POA: Diagnosis not present

## 2023-05-25 DIAGNOSIS — F430 Acute stress reaction: Secondary | ICD-10-CM | POA: Diagnosis not present

## 2023-05-25 DIAGNOSIS — R45850 Homicidal ideations: Secondary | ICD-10-CM

## 2023-05-25 DIAGNOSIS — J449 Chronic obstructive pulmonary disease, unspecified: Secondary | ICD-10-CM | POA: Diagnosis not present

## 2023-05-25 DIAGNOSIS — R45851 Suicidal ideations: Secondary | ICD-10-CM

## 2023-05-25 DIAGNOSIS — F329 Major depressive disorder, single episode, unspecified: Secondary | ICD-10-CM | POA: Diagnosis not present

## 2023-05-25 DIAGNOSIS — F1410 Cocaine abuse, uncomplicated: Secondary | ICD-10-CM | POA: Diagnosis not present

## 2023-05-25 DIAGNOSIS — F1010 Alcohol abuse, uncomplicated: Secondary | ICD-10-CM | POA: Diagnosis not present

## 2023-05-25 DIAGNOSIS — Z91013 Allergy to seafood: Secondary | ICD-10-CM | POA: Diagnosis not present

## 2023-05-25 DIAGNOSIS — F333 Major depressive disorder, recurrent, severe with psychotic symptoms: Secondary | ICD-10-CM

## 2023-05-25 DIAGNOSIS — Z951 Presence of aortocoronary bypass graft: Secondary | ICD-10-CM | POA: Diagnosis not present

## 2023-05-25 DIAGNOSIS — F17210 Nicotine dependence, cigarettes, uncomplicated: Secondary | ICD-10-CM | POA: Diagnosis not present

## 2023-05-25 DIAGNOSIS — I10 Essential (primary) hypertension: Secondary | ICD-10-CM | POA: Diagnosis not present

## 2023-05-25 DIAGNOSIS — I2510 Atherosclerotic heart disease of native coronary artery without angina pectoris: Secondary | ICD-10-CM | POA: Diagnosis not present

## 2023-05-25 HISTORY — DX: Homicidal ideations: R45.850

## 2023-05-25 MED ORDER — MIRTAZAPINE 15 MG PO TABS: 15.0000 mg | ORAL_TABLET | Freq: Every day | ORAL | Status: DC

## 2023-05-25 MED ORDER — ARIPIPRAZOLE 5 MG PO TABS: 5.0000 mg | ORAL_TABLET | Freq: Every day | ORAL | Status: DC

## 2023-05-25 NOTE — ED Notes (Signed)
All belongings and paperwork given to safe transport. Pt ambulatory w/ steady gait and VSS at time of departure.

## 2023-05-25 NOTE — ED Notes (Signed)
Vol/Inpt tx

## 2023-05-25 NOTE — ED Notes (Addendum)
This RN was notified by sitter that pt stated, "I'm good now and can go home now." Notified Andria Meuse NP. Clarifying if pt will be IVC'd or not. Per Andria Meuse NP, [facility] recommended inpatient tx and pt would need IVC if not willing to go voluntary.

## 2023-05-25 NOTE — ED Notes (Signed)
Notified Diplomatic Services operational officer of need for safe transport to be called.

## 2023-05-25 NOTE — Progress Notes (Signed)
LCSW Progress Note  [id number]   Robert Chan  05/25/2023  10:58 AM  Description:   Inpatient Psychiatric Referral  Patient was recommended inpatient per Arsenio Loader, NP. There are no available beds at [facility] or [facility], per [facility] Rona Ravens, RN. Patient was referred to the following out of network facilities:   Destination  Service Provider Address Phone Fax  [facility]  [address] [place] [address] [phone]  [facility]  [address] [place], [place] [address] [phone]  [facility]  [address] [place] [address] [phone]  [facility]  [address], [place] [address] [phone]  [facility]  [address] [place] [address] [phone]  [facility]  [address] [place] [address] [phone]  [facility]  [address] [place] [address] [phone]  [facility]  [address] [place] [address] [phone]  [facility]  [address] [place] [address] [phone]  [facility]  [address] [place] [address] [phone]  [facility]  [address], [place] [address] [phone]  [facility]  [address] [place] [address] [phone]  [facility]  [address] [place] [address] [phone]  [facility] [address]  [address] [place] [address] [phone]  [facility]  [address] [place] [address] [phone]  [facility]  [address] [place] [address] [phone]  [facility]  [address] [place] [address] [phone]  [facility]  [address] [place] [address] [phone]    Situation ongoing, CSW to continue following and update chart as more information becomes available.      Cathie Beams, [place]  05/25/2023 10:58 AM

## 2023-05-25 NOTE — ED Notes (Signed)
TTS in process 

## 2023-05-25 NOTE — Consult Note (Signed)
[facility] Consult Note  Patient Name: Robert Chan MRN: [id number] DOB: 14-Jun-1956 DATE OF Consult: 05/25/2023  PRIMARY PSYCHIATRIC DIAGNOSES  1.  Suicidal ideations 2. Homicidal ideations 3.  MDD with psychosis 4.  Cocaine abuse  RECOMMENDATIONS  Recommendations: Medication recommendations: Abilify 5mg  PO at bedtime for mood stabilization and hallucinations. Mirtazapine 15mg  PO at bedtime for depression. Non-Medication/therapeutic recommendations: Patient would benefit from substance abuse treatment Is inpatient psychiatric hospitalization recommended for this patient? Yes (Explain why): patient is having suicidal and homicidal ideations Communication: Treatment team members (and family members if applicable) who were involved in treatment/care discussions and planning, and with whom we spoke or engaged with via secure text/chat, include the following: Patient's treatment team.  Thank you for involving [place] in the care of this patient. If you have any additional questions or concerns, please call [phone] and ask for me or the provider on-call.  TELEPSYCHIATRY ATTESTATION & CONSENT  As the provider for this telehealth consult, I attest that I verified the patient's identity using two separate identifiers, introduced myself to the patient, provided Robert credentials, disclosed Robert location, and performed this encounter via a HIPAA-compliant, real-time, face-to-face, two-way, interactive audio and video platform and with the full consent and agreement of the patient (or guardian as applicable.)  Patient physical location: [facility]. Telehealth provider physical location: home office in state of [place].  Video start time: 0418 (Central Time) Video end time: 0437 (Central Time)  IDENTIFYING DATA  Robert Chan is a 67 y.o. year-old male for whom a psychiatric consultation has been ordered by the primary provider. The patient was identified using two separate identifiers.   CHIEF COMPLAINT/REASON FOR CONSULT  Suicidal ideation, Homicidal ideations and depression  HISTORY OF PRESENT ILLNESS (HPI)  Patient is a 67 year old male with a history of COPD, Chronic kidney disease, cocaine abuse and CAD. The patient presents with ongoing suicidal ideations with plan to overdose on pills or cut himself and having homicidal thoughts towards his daughter's boyfriend because he doesn't like him. He reports that he has also being hearing voices but unable to make out what the voices are saying.  Patient reports that he was discharged from an inpatient mental health hospital on 04/19/23 for suicidal ideations. He reports that he was prescribed medications but has not taken any medications since he was discharged. The patient reports that he is currently experiencing depression and feelings of hopelessness. He reports having feelings that people are trying to harm him. The patient reports that he lives alone in an apartment and is supported by disability benefits. He denies smoking cigarettes but admit to use of cocaine. The patient has a family history of mental health illness, with relatives who have passed away due to alcohol and drug-related issues. Chart review shows patient had been prescribed Abilify, Trazodone, Mirtazapine and Hydroxyzine. The patient is agreeable to being admitted to the hospital and would like help with his depression, suicidal and homicidal thoughts thoughts.  PAST PSYCHIATRIC HISTORY  Depression Suicidal ideations Substance abuse Otherwise as per HPI above.  PAST MEDICAL HISTORY  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   Depression    Elevated serum creatinine 08/28/2019  Homelessness 06/19/2020   Hypertension    NSTEMI (non-ST elevated myocardial infarction) [facility])    Status post incision and drainage 04/22/2021   Suicidal ideation 06/05/2020     HOME MEDICATIONS  Facility Ordered Medications  Medication   ARIPiprazole (ABILIFY) tablet 5 mg   mirtazapine (REMERON) tablet 15 mg   PTA Medications  Medication Sig   atorvastatin (LIPITOR) 80 MG tablet Take 1 tablet (80 mg total) by mouth daily.   diltiazem (CARDIZEM CD) 180 MG 24 hr capsule Take 1 capsule (180 mg total) by mouth daily.   ARIPiprazole (ABILIFY) 10 MG tablet Take 1 tablet (10 mg total) by mouth daily after breakfast.   hydrOXYzine (ATARAX) 25 MG tablet Take 1 tablet (25 mg total) by mouth 3 (three) times daily as needed for anxiety.   mirtazapine (REMERON) 45 MG tablet Take 1 tablet (45 mg total) by mouth at bedtime.   traZODone (DESYREL) 50 MG tablet Take 1 tablet (50 mg total) by mouth at bedtime as needed for sleep.   albuterol (VENTOLIN HFA) 108 (90 Base) MCG/ACT inhaler Inhale 2 puffs into the lungs every 6 (six) hours as needed for shortness of breath.   mometasone-formoterol (DULERA) 200-5 MCG/ACT AERO Inhale 2 puffs into the lungs 2 (two) times daily.   latanoprost (XALATAN) 0.005 % ophthalmic solution Place 1 drop into both eyes at bedtime.     ALLERGIES  Allergies  Allergen Reactions   Shellfish Allergy Hives and Rash   Shellfish-Derived Products Rash    Other reaction(s): Other (See Comments)    SOCIAL & SUBSTANCE USE HISTORY  Social History   Socioeconomic History   Marital status: Single    Spouse name: Not on file   Number of children: Not on file   Years of education: Not on file   Highest education level: Not on file  Occupational History   Not on file  Tobacco Use   Smoking status: Some Days    Current packs/day: 0.00    Types: Cigarettes    Last attempt to quit: 12/25/2020    Years since quitting: 2.4   Smokeless tobacco: Never   Tobacco comments:    Smokes a  couple cigarettes every now and then  Vaping Use   Vaping status: Never Used  Substance and Sexual Activity   Alcohol use: Yes    Comment: drinks 2x/wk, 2-3 40 oz drinks, last drink 2 weeks ago   Drug use: Not Currently    Types: Cocaine    Comment: cocaine use once a month, last use 6 mos ago   Sexual activity: Not Currently  Other Topics Concern   Not on file  Social History Narrative   His mother passed at age 54 (when he was 72 years old) and his father raised him and his siblings   His parents were pastors as a lot of his sisters and other family members are today   There was a total of 13 children in his family Had 23 sisters, one sister deceased , Had 3 brothers, all brothers deceased   He is the youngest of the 61 and was "always in trouble in Robert younger years"   Family still live in [place], [place], some in [place], [place]   Has a Daughter and son with a total of 10 grandchildren (6 grand daughters local and 4 grandsons)    Values family   Married twice, present wife incarcerated   Social Determinants of Health   Financial Resource Strain: Low Risk  (  10/19/2022)   Overall Financial Resource Strain (CARDIA)    Difficulty of Paying Living Expenses: Not hard at all  Food Insecurity: No Food Insecurity (04/06/2023)   Hunger Vital Sign    Worried About Running Out of Food in the Last Year: Never true    Ran Out of Food in the Last Year: Never true  Transportation Needs: No Transportation Needs (04/06/2023)   PRAPARE - Administrator, Civil Service (Medical): No    Lack of Transportation (Non-Medical): No  Physical Activity: Inactive (10/19/2022)   Exercise Vital Sign    Days of Exercise per Week: 0 days    Minutes of Exercise per Session: 0 min  Stress: No Stress Concern Present (10/19/2022)   [name] of Occupational Health - Occupational Stress Questionnaire    Feeling of Stress : Not at all  Social Connections: Moderately Integrated (03/31/2022)    Social Connection and Isolation Panel [NHANES]    Frequency of Communication with Friends and Family: Three times a week    Frequency of Social Gatherings with Friends and Family: Three times a week    Attends Religious Services: 1 to 4 times per year    Active Member of Clubs or Organizations: Yes    Attends Banker Meetings: 1 to 4 times per year    Marital Status: Never married   Social History   Tobacco Use  Smoking Status Some Days   Current packs/day: 0.00   Types: Cigarettes   Last attempt to quit: 12/25/2020   Years since quitting: 2.4  Smokeless Tobacco Never  Tobacco Comments   Smokes a couple cigarettes every now and then   Social History   Substance and Sexual Activity  Alcohol Use Yes   Comment: drinks 2x/wk, 2-3 40 oz drinks, last drink 2 weeks ago   Social History   Substance and Sexual Activity  Drug Use Not Currently   Types: Cocaine   Comment: cocaine use once a month, last use 6 mos ago    Additional pertinent information .  FAMILY HISTORY  Family History  Problem Relation Age of Onset   Hypertension Mother    Diabetes Mother    Hypertension Father    Hypertension Sister    Family Psychiatric History (if known):  Patient reports that his 3 brothers died from alcohol and illicit drug use  MENTAL STATUS EXAM (MSE)  Presentation  General Appearance:  Appropriate for Environment  Eye Contact: Fair  Speech: Normal Rate  Speech Volume: Decreased  Handedness: Right   Mood and Affect  Mood: Depressed; Hopeless; Worthless  Affect: Solicitor Processes: Coherent  Descriptions of Associations: Intact  Orientation: Full (Time, Place and Person)  Thought Content: Logical  History of Schizophrenia/Schizoaffective disorder: -- (unknown)  Duration of Psychotic Symptoms: N/A  Hallucinations:Hallucinations: Auditory Description of Auditory Hallucinations: "I hear people talking but cant  understand them"  Ideas of Reference: Paranoia  Suicidal Thoughts:Suicidal Thoughts: Yes, Active SI Active Intent and/or Plan: With Plan  Homicidal Thoughts:Homicidal Thoughts: Yes, Passive HI Passive Intent and/or Plan: Without Intent; Without Plan   Sensorium  Memory: Immediate Fair; Remote Fair  Judgment: Fair  Insight: Fair   Chartered certified accountant: Fair  Attention Span: Fair  Recall: [employer] of Knowledge: Fair  Language: Fair   Psychomotor Activity  Psychomotor Activity:Psychomotor Activity: Normal  Assets  Assets: Manufacturing systems engineer; Housing; Other (comment) (SSDI)   Sleep  Sleep:Sleep: Fair   VITALS  Blood  pressure 120/78, pulse 75, temperature 98.4 F (36.9 C), temperature source Oral, resp. rate 18, SpO2 98%.  LABS  Admission on 05/24/2023  Component Date Value Ref Range Status   Sodium 05/24/2023 139  135 - 145 mmol/L Final   Potassium 05/24/2023 3.5  3.5 - 5.1 mmol/L Final   Chloride 05/24/2023 105  98 - 111 mmol/L Final   CO2 05/24/2023 21 (L)  22 - 32 mmol/L Final   Glucose, Bld 05/24/2023 117 (H)  70 - 99 mg/dL Final   Glucose reference range applies only to samples taken after fasting for at least 8 hours.   BUN 05/24/2023 7 (L)  8 - 23 mg/dL Final   Creatinine, Ser 05/24/2023 1.10  0.61 - 1.24 mg/dL Final   Calcium 65/78/4696 8.7 (L)  8.9 - 10.3 mg/dL Final   Total Protein 29/52/8413 6.9  6.5 - 8.1 g/dL Final   Albumin 24/40/1027 3.6  3.5 - 5.0 g/dL Final   AST 25/36/6440 19  15 - 41 U/L Final   ALT 05/24/2023 14  0 - 44 U/L Final   Alkaline Phosphatase 05/24/2023 62  38 - 126 U/L Final   Total Bilirubin 05/24/2023 0.7  0.3 - 1.2 mg/dL Final   GFR, Estimated 05/24/2023 >60  >60 mL/min Final   Comment: (NOTE) Calculated using the CKD-EPI Creatinine Equation (2021)    Anion gap 05/24/2023 13  5 - 15 Final   Performed at [facility], [address] [place], [place] [address]   Alcohol, Ethyl (B)  05/24/2023 18 (H)  <10 mg/dL Final   Comment: (NOTE) Lowest detectable limit for serum alcohol is 10 mg/dL.  For medical purposes only. Performed at [facility], [address] [place], [place] [address]    Salicylate Lvl 05/24/2023 <7.0 (L)  7.0 - 30.0 mg/dL Final   Performed at [facility], [address], [place], [place] [address]   Acetaminophen (Tylenol), Serum 05/24/2023 <10 (L)  10 - 30 ug/mL Final   Comment: (NOTE) Therapeutic concentrations vary significantly. A range of 10-30 ug/mL  may be an effective concentration for many patients. However, some  are best treated at concentrations outside of this range. Acetaminophen concentrations >150 ug/mL at 4 hours after ingestion  and >50 ug/mL at 12 hours after ingestion are often associated with  toxic reactions.  Performed at [facility], [address], [place], [place] [address]    WBC 05/24/2023 7.7  4.0 - 10.5 K/uL Final   RBC 05/24/2023 4.01 (L)  4.22 - 5.81 MIL/uL Final   Hemoglobin 05/24/2023 12.5 (L)  13.0 - 17.0 g/dL Final   HCT 18/84/1660 38.3 (L)  39.0 - 52.0 % Final   MCV 05/24/2023 95.5  80.0 - 100.0 fL Final   MCH 05/24/2023 31.2  26.0 - 34.0 pg Final   MCHC 05/24/2023 32.6  30.0 - 36.0 g/dL Final   RDW 63/11/6008 12.7  11.5 - 15.5 % Final   Platelets 05/24/2023 255  150 - 400 K/uL Final   nRBC 05/24/2023 0.0  0.0 - 0.2 % Final   Performed at [facility], [address] [place], [place] [address]   Opiates 05/24/2023 NONE DETECTED  NONE DETECTED Final   Cocaine 05/24/2023 POSITIVE (A)  NONE DETECTED Final   Benzodiazepines 05/24/2023 NONE DETECTED  NONE DETECTED Final   Amphetamines 05/24/2023 NONE DETECTED  NONE DETECTED Final   Tetrahydrocannabinol 05/24/2023 NONE DETECTED  NONE DETECTED Final   Barbiturates  05/24/2023 NONE DETECTED  NONE DETECTED Final   Comment: (NOTE) DRUG SCREEN FOR MEDICAL PURPOSES ONLY.  IF CONFIRMATION IS NEEDED FOR ANY PURPOSE, NOTIFY LAB WITHIN 5  DAYS.  LOWEST DETECTABLE LIMITS FOR URINE DRUG SCREEN Drug Class                     Cutoff (ng/mL) Amphetamine and metabolites    1000 Barbiturate and metabolites    200 Benzodiazepine                 200 Opiates and metabolites        300 Cocaine and metabolites        300 THC                            50 Performed at [facility], [address], [place], [place] [address]    Troponin I (High Sensitivity) 05/24/2023 14  <18 ng/L Final   Comment: (NOTE) Elevated high sensitivity troponin I (hsTnI) values and significant  changes across serial measurements may suggest ACS but many other  chronic and acute conditions are known to elevate hsTnI results.  Refer to the "Links" section for chest pain algorithms and additional  guidance. Performed at [facility], [address] [place], [place] [address]    Troponin I (High Sensitivity) 05/24/2023 13  <18 ng/L Final   Comment: (NOTE) Elevated high sensitivity troponin I (hsTnI) values and significant  changes across serial measurements may suggest ACS but many other  chronic and acute conditions are known to elevate hsTnI results.  Refer to the "Links" section for chest pain algorithms and additional  guidance. Performed at [facility], [address], [place], [place] [address]     PSYCHIATRIC REVIEW OF SYSTEMS (ROS)  - Patient presents with ongoing suicidal and homicidal ideations. - Expressed feelings of depression and hopelessness. - Hears voices that are difficulty to understand. - Exhibited forgetfulness regarding details of recent hospitalization. - Feels others are trying to harm him. - Has been using cocaine Receptive to inpatient admission  Additional findings:      Musculoskeletal: No abnormal movements observed      Gait & Station: Laying/Sitting      Pain Screening: Denies      Nutrition & Dental Concerns: denies  RISK FORMULATION/ASSESSMENT  Is the patient experiencing any suicidal or  homicidal ideations: Yes       Explain if yes: - Patient presents with ongoing suicidal and homicidal ideations. Protective factors considered for safety management: access to appropriate clinical intervention.  Risk factors/concerns considered for safety management:  Prior attempt Depression Substance abuse/dependence Physical illness/chronic pain Age over 3 Hopelessness Male gender Unmarried  Is there a safety management plan with the patient and treatment team to minimize risk factors and promote protective factors: Yes           Explain: admit to psych Is crisis care placement or psychiatric hospitalization recommended: Yes     Based on Robert current evaluation and risk assessment, patient is determined at this time to be at:  Moderate Risk  *RISK ASSESSMENT Risk assessment is a dynamic process; it is possible that this patient's condition, and risk level, may change. This should be re-evaluated and managed over time as appropriate. Please re-consult psychiatric consult services if additional assistance is needed in terms of risk assessment and management. If your team decides to discharge this patient,  please advise the patient how to best access emergency psychiatric services, or to call 911, if their condition worsens or they feel unsafe in any way.   Norval Morton, NP Telepsychiatry Consult Services

## 2023-05-25 NOTE — ED Provider Notes (Signed)
Emergency Medicine Observation Re-evaluation Note  Robert Chan is a 67 y.o. male, seen on rounds today.  Pt initially presented to the ED for complaints of Suicidal and Chest Pain Currently, the patient is resting comfortably watching TV. Chest pain has resolved. No physical complaints this morning.   Physical Exam  BP 120/78   Pulse 75   Temp 98.4 F (36.9 C) (Oral)   Resp 18   SpO2 98%  Physical Exam General: Not in distress  Cardiac: Normal rate  Lungs: equal chest rise  Psych: Calm and cooperative.   ED Course / MDM  EKG:   I have reviewed the labs performed to date as well as medications administered while in observation.    Plan  Current plan is for Psych/TTS assessment.     Coral Spikes, DO 05/25/23 [phone]

## 2023-05-25 NOTE — ED Notes (Signed)
Spoke w/ pt who agrees to stay voluntary at this time.

## 2023-05-25 NOTE — Progress Notes (Signed)
Pt was accepted to [facility] TODAY 05/25/2023. Bed assignment: [facility] unit  Pt meets inpatient criteria per Arsenio Loader, NP  Attending Physician will be Joylene Igo, AGPCNP  Report can be called to: [phone]  Pt can arrive anytime today; bed is ready now  Care Team Notified: Arsenio Loader, NP and Denton Ar, RN  Cathie Beams, LCSW  05/25/2023 2:22 PM

## 2023-05-26 DIAGNOSIS — F332 Major depressive disorder, recurrent severe without psychotic features: Secondary | ICD-10-CM | POA: Diagnosis not present

## 2023-05-26 DIAGNOSIS — F1410 Cocaine abuse, uncomplicated: Secondary | ICD-10-CM | POA: Diagnosis not present

## 2023-05-26 DIAGNOSIS — F430 Acute stress reaction: Secondary | ICD-10-CM | POA: Diagnosis not present

## 2023-05-27 ENCOUNTER — Ambulatory Visit (HOSPITAL_COMMUNITY): Payer: Medicare HMO | Admitting: Psychiatry

## 2023-06-01 ENCOUNTER — Emergency Department (HOSPITAL_COMMUNITY)
Admission: EM | Admit: 2023-06-01 | Discharge: 2023-06-01 | Disposition: A | Discharge status: Home / Self Care | Payer: Medicare HMO | Attending: Emergency Medicine | Admitting: Emergency Medicine

## 2023-06-01 ENCOUNTER — Encounter (HOSPITAL_COMMUNITY): Payer: Self-pay

## 2023-06-01 ENCOUNTER — Other Ambulatory Visit: Payer: Self-pay

## 2023-06-01 ENCOUNTER — Emergency Department (HOSPITAL_COMMUNITY): Payer: Medicare HMO

## 2023-06-01 DIAGNOSIS — J449 Chronic obstructive pulmonary disease, unspecified: Secondary | ICD-10-CM | POA: Diagnosis not present

## 2023-06-01 DIAGNOSIS — N189 Chronic kidney disease, unspecified: Secondary | ICD-10-CM | POA: Insufficient documentation

## 2023-06-01 DIAGNOSIS — R197 Diarrhea, unspecified: Secondary | ICD-10-CM | POA: Diagnosis not present

## 2023-06-01 DIAGNOSIS — R1030 Lower abdominal pain, unspecified: Secondary | ICD-10-CM | POA: Diagnosis not present

## 2023-06-01 DIAGNOSIS — K7689 Other specified diseases of liver: Secondary | ICD-10-CM | POA: Diagnosis not present

## 2023-06-01 DIAGNOSIS — I129 Hypertensive chronic kidney disease with stage 1 through stage 4 chronic kidney disease, or unspecified chronic kidney disease: Secondary | ICD-10-CM | POA: Diagnosis not present

## 2023-06-01 DIAGNOSIS — I10 Essential (primary) hypertension: Secondary | ICD-10-CM | POA: Diagnosis not present

## 2023-06-01 DIAGNOSIS — K6389 Other specified diseases of intestine: Secondary | ICD-10-CM | POA: Diagnosis not present

## 2023-06-01 DIAGNOSIS — D72829 Elevated white blood cell count, unspecified: Secondary | ICD-10-CM | POA: Insufficient documentation

## 2023-06-01 DIAGNOSIS — K625 Hemorrhage of anus and rectum: Secondary | ICD-10-CM | POA: Diagnosis not present

## 2023-06-01 DIAGNOSIS — K4020 Bilateral inguinal hernia, without obstruction or gangrene, not specified as recurrent: Secondary | ICD-10-CM | POA: Diagnosis not present

## 2023-06-01 DIAGNOSIS — J45909 Unspecified asthma, uncomplicated: Secondary | ICD-10-CM | POA: Diagnosis not present

## 2023-06-01 DIAGNOSIS — R58 Hemorrhage, not elsewhere classified: Secondary | ICD-10-CM | POA: Diagnosis not present

## 2023-06-01 LAB — COMPREHENSIVE METABOLIC PANEL
ALT: 18 U/L (ref 0–44)
AST: 26 U/L (ref 15–41)
Albumin: 3.5 g/dL (ref 3.5–5.0)
Alkaline Phosphatase: 65 U/L (ref 38–126)
Anion gap: 11 (ref 5–15)
BUN: 13 mg/dL (ref 8–23)
CO2: 22 mmol/L (ref 22–32)
Calcium: 8.6 mg/dL — ABNORMAL LOW (ref 8.9–10.3)
Chloride: 102 mmol/L (ref 98–111)
Creatinine, Ser: 1.35 mg/dL — ABNORMAL HIGH (ref 0.61–1.24)
GFR, Estimated: 57 mL/min — ABNORMAL LOW (ref >60)
Glucose, Bld: 100 mg/dL — ABNORMAL HIGH (ref 70–99)
Potassium: 4 mmol/L (ref 3.5–5.1)
Sodium: 135 mmol/L (ref 135–145)
Total Bilirubin: 0.8 mg/dL (ref 0.3–1.2)
Total Protein: 6.8 g/dL (ref 6.5–8.1)

## 2023-06-01 LAB — PROTIME-INR
INR: 1 (ref 0.8–1.2)
Prothrombin Time: 13.7 seconds (ref 11.4–15.2)

## 2023-06-01 LAB — ABO/RH: ABO/RH(D): O POS

## 2023-06-01 LAB — CBC WITH DIFFERENTIAL/PLATELET
Abs Immature Granulocytes: 0.1 10*3/uL — ABNORMAL HIGH (ref 0.00–0.07)
Basophils Absolute: 0.1 10*3/uL (ref 0.0–0.1)
Basophils Relative: 1 %
Eosinophils Absolute: 0 10*3/uL (ref 0.0–0.5)
Eosinophils Relative: 0 %
HCT: 37.8 % — ABNORMAL LOW (ref 39.0–52.0)
Hemoglobin: 12.2 g/dL — ABNORMAL LOW (ref 13.0–17.0)
Immature Granulocytes: 1 %
Lymphocytes Relative: 16 %
Lymphs Abs: 1.9 10*3/uL (ref 0.7–4.0)
MCH: 31 pg (ref 26.0–34.0)
MCHC: 32.3 g/dL (ref 30.0–36.0)
MCV: 95.9 fL (ref 80.0–100.0)
Monocytes Absolute: 0.9 10*3/uL (ref 0.1–1.0)
Monocytes Relative: 7 %
Neutro Abs: 8.7 10*3/uL — ABNORMAL HIGH (ref 1.7–7.7)
Neutrophils Relative %: 75 %
Platelets: 238 10*3/uL (ref 150–400)
RBC: 3.94 MIL/uL — ABNORMAL LOW (ref 4.22–5.81)
RDW: 12.4 % (ref 11.5–15.5)
WBC: 11.7 10*3/uL — ABNORMAL HIGH (ref 4.0–10.5)
nRBC: 0 % (ref 0.0–0.2)

## 2023-06-01 LAB — TYPE AND SCREEN
ABO/RH(D): O POS
Antibody Screen: NEGATIVE

## 2023-06-01 LAB — POC OCCULT BLOOD, ED: Fecal Occult Bld: POSITIVE — AB

## 2023-06-01 MED ORDER — POLYETHYLENE GLYCOL 3350 17 GM/SCOOP PO POWD: 1.0000 | Freq: Once | ORAL | 0 refills | Status: AC

## 2023-06-01 MED ORDER — PANTOPRAZOLE SODIUM 20 MG PO TBEC: 20.0000 mg | DELAYED_RELEASE_TABLET | Freq: Every day | ORAL | 0 refills | Status: DC

## 2023-06-01 MED ORDER — IOHEXOL 350 MG/ML SOLN
75.0000 mL | Freq: Once | INTRAVENOUS | Status: AC | PRN
  Administered 2023-06-01: 75 mL via INTRAVENOUS

## 2023-06-01 MED ORDER — PANTOPRAZOLE SODIUM 40 MG IV SOLR
40.0000 mg | Freq: Once | INTRAVENOUS | Status: AC
  Administered 2023-06-01: 40 mg via INTRAVENOUS
  Filled 2023-06-01: qty 10

## 2023-06-01 NOTE — ED Notes (Signed)
Patient transported to CT 

## 2023-06-01 NOTE — ED Provider Notes (Signed)
Accepted handoff at shift change from [facility]. Please see prior provider note for full HPI.  Briefly: Patient is a 67 y.o. male who presents to the ER for lower abdominal pain and rectal bleeding. Not on blood thinners. Suspect bleeding from internal hemorrhoid. Hemoglobin stable, hemodynamically stable.   DDX/Plan: Plan to follow up on CMP and CT scan. Anticipate d/c to home if unremarkable.   Physical Exam  BP (!) 174/84 (BP Location: Right Arm)   Pulse 99   Temp 98.9 F (37.2 C) (Oral)   Resp 16   Ht 6\' 4"  (1.93 m)   Wt 117.9 kg   SpO2 97%   BMI 31.65 kg/m   Physical Exam Vitals and nursing note reviewed.  Constitutional:      Appearance: Normal appearance.  HENT:     Head: Normocephalic and atraumatic.  Eyes:     Conjunctiva/sclera: Conjunctivae normal.  Pulmonary:     Effort: Pulmonary effort is normal. No respiratory distress.  Abdominal:     Palpations: Abdomen is soft.     Tenderness: There is no abdominal tenderness.  Skin:    General: Skin is warm and dry.  Neurological:     Mental Status: He is alert.  Psychiatric:        Mood and Affect: Mood normal.        Behavior: Behavior normal.    Results   Results for orders placed or performed during the hospital encounter of 06/01/23  Comprehensive metabolic panel  Result Value Ref Range   Sodium 135 135 - 145 mmol/L   Potassium 4.0 3.5 - 5.1 mmol/L   Chloride 102 98 - 111 mmol/L   CO2 22 22 - 32 mmol/L   Glucose, Bld 100 (H) 70 - 99 mg/dL   BUN 13 8 - 23 mg/dL   Creatinine, Ser 1.61 (H) 0.61 - 1.24 mg/dL   Calcium 8.6 (L) 8.9 - 10.3 mg/dL   Total Protein 6.8 6.5 - 8.1 g/dL   Albumin 3.5 3.5 - 5.0 g/dL   AST 26 15 - 41 U/L   ALT 18 0 - 44 U/L   Alkaline Phosphatase 65 38 - 126 U/L   Total Bilirubin 0.8 0.3 - 1.2 mg/dL   GFR, Estimated 57 (L) >60 mL/min   Anion gap 11 5 - 15   CT ABDOMEN PELVIS W CONTRAST  Result Date: 06/01/2023 CLINICAL DATA:  Abdominal pain, acute, nonlocalized EXAM: CT  ABDOMEN AND PELVIS WITH CONTRAST TECHNIQUE: Multidetector CT imaging of the abdomen and pelvis was performed using the standard protocol following bolus administration of intravenous contrast. RADIATION DOSE REDUCTION: This exam was performed according to the departmental dose-optimization program which includes automated exposure control, adjustment of the mA and/or kV according to patient size and/or use of iterative reconstruction technique. CONTRAST:  75mL OMNIPAQUE IOHEXOL 350 MG/ML SOLN COMPARISON:  CT scan renal stone protocol from 09/07/2022. FINDINGS: Lower chest: There are subpleural atelectatic changes in the visualized lung bases. No overt consolidation. No pleural effusion. The heart is normal in size. No pericardial effusion. Hepatobiliary: The liver is normal in size. Non-cirrhotic configuration. No suspicious mass. There is a 1.3 x 1.6 cm simple cyst in the right hepatic lobe, segment 7. There is an additional smaller simple cyst in the left hepatic lobe, segment 2. No intrahepatic or extrahepatic bile duct dilation. No calcified gallstones. Normal gallbladder wall thickness. No pericholecystic inflammatory changes. Pancreas: Unremarkable. No pancreatic ductal dilatation or surrounding inflammatory changes. Spleen: Within normal limits. No focal lesion.  Adrenals/Urinary Tract: Adrenal glands are unremarkable. No suspicious renal mass. No hydronephrosis. No renal or ureteric calculi. Unremarkable urinary bladder. Stomach/Bowel: There is an ill-defined approximately 1.5 x 3.1 cm fat attenuation structure in the fundus of the stomach, which in retrospect was present on the prior examination as well. This is favored to represent gastric submucosal lipoma. No disproportionate dilation of the small or large bowel loops. No evidence of abnormal bowel wall thickening or inflammatory changes. The appendix is surgically absent. Vascular/Lymphatic: No ascites or pneumoperitoneum. No abdominal or pelvic  lymphadenopathy, by size criteria. No aneurysmal dilation of the major abdominal arteries. There are moderate peripheral atherosclerotic vascular calcifications of the aorta and its major branches. Reproductive: Patient is status post surgical removal of prostate. Symmetric bilateral seminal vesicles. Other: There are tiny bilateral small fat containing inguinal hernias. The soft tissues and abdominal wall are otherwise unremarkable. Musculoskeletal: No suspicious osseous lesions. There are mild multilevel degenerative changes in the visualized spine. IMPRESSION: 1. No acute abnormality in the abdomen or pelvis. 2. 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 Consider endoscopy for further evaluation. Electronically Signed   By: Jules Schick M.D.   On: 06/01/2023 09:07   ED Course / MDM    Medical Decision Making Amount and/or Complexity of Data Reviewed Labs: ordered. Radiology: ordered.  Risk Prescription drug management.  CMP stable, unremarkable. CT with no acute abnormalities, possible lipoma in fundus of stomach.   Upon reevaluation, patient states he is feeling better and ready to go home. Has been able to use the restroom and reports bleeding has decreased. He does admit to periods of constipation and straining recently. Does not believe he has ever had a colonoscopy before.  Will d/c to home with protonix and miralax. Will give GI referral and recommend following up with his PCP as well. Patient agreeable to the plan, discharged in stable condition and all questions answered.     Jeanella Flattery 06/01/23 [id number]    Gerhard Munch, MD 06/01/23 [phone]

## 2023-06-01 NOTE — Discharge Instructions (Addendum)
You were seen in the ER for abdominal pain and rectal bleeding.  As we discussed, your blood work and CT scan was reassuring today. Your bleeding could have come from an internal hemorrhoid, which can present as bright red blood, especially after a period of constipation.   I am prescribing you miralax and an acid-reducing medicine for your stomach. These in combination can help treat the likely underlying problem. I'd also like you to follow up with the gastroenterologist, especially if your symptoms continue. I think you likely need a colonoscopy. This would help determine your risk for colon cancer. Either call the number attached, or call your primary doctor to ask about scheduling this.  Continue to monitor how you're doing and return to the ER for new or worsening symptoms.

## 2023-06-01 NOTE — ED Provider Notes (Signed)
[facility] Provider Note   CSN: [id number] Arrival date & time: 06/01/23  0458     History  Chief Complaint  Patient presents with   Rectal Bleeding    Robert Chan is a 67 y.o. male.  HPI   Patient with medical history including asthma, COPD, CKD, polysubstance use, hypertension, presenting with rectal bleeding.  Patient states that rectal bleeding started early this morning, states he went to the bathroom and had bloody stool, denies any dark tarry stools, denies any rectal pain, or abdominal pain, no associated nausea vomiting, denies any bloody emesis coffee-ground emesis.  He denies any history of GI bleeds, denies excessive alcohol use, NSAID use, states he never had this in the past.  He denies any lightheadedness or near syncope any chest pain or shortness of breath, he denies any antiplatelet or anticoag use.  States that he is concerned which brought him in here for further evaluation.    Home Medications Prior to Admission medications   Medication Sig Start Date End Date Taking? Authorizing Provider  albuterol (VENTOLIN HFA) 108 (90 Base) MCG/ACT inhaler Inhale 2 puffs into the lungs every 6 (six) hours as needed for shortness of breath. 04/19/23   Clapacs, Jackquline Denmark, MD  ARIPiprazole (ABILIFY) 10 MG tablet Take 1 tablet (10 mg total) by mouth daily after breakfast. Patient not taking: Reported on 05/25/2023 04/20/23   Clapacs, Jackquline Denmark, MD  atorvastatin (LIPITOR) 80 MG tablet Take 1 tablet (80 mg total) by mouth daily. Patient not taking: Reported on 05/25/2023 04/19/23 04/19/24  Clapacs, Jackquline Denmark, MD  diltiazem (CARDIZEM CD) 180 MG 24 hr capsule Take 1 capsule (180 mg total) by mouth daily. Patient not taking: Reported on 05/25/2023 04/20/23   Clapacs, Jackquline Denmark, MD  hydrOXYzine (ATARAX) 25 MG tablet Take 1 tablet (25 mg total) by mouth 3 (three) times daily as needed for anxiety. Patient not taking: Reported on 05/25/2023 04/19/23    Clapacs, Jackquline Denmark, MD  latanoprost (XALATAN) 0.005 % ophthalmic solution Place 1 drop into both eyes at bedtime. 04/19/23   Clapacs, Jackquline Denmark, MD  mirtazapine (REMERON) 45 MG tablet Take 1 tablet (45 mg total) by mouth at bedtime. Patient not taking: Reported on 05/25/2023 04/19/23   Clapacs, Jackquline Denmark, MD  mometasone-formoterol (DULERA) 200-5 MCG/ACT AERO Inhale 2 puffs into the lungs 2 (two) times daily. 04/19/23   Clapacs, Jackquline Denmark, MD  traZODone (DESYREL) 50 MG tablet Take 1 tablet (50 mg total) by mouth at bedtime as needed for sleep. Patient not taking: Reported on 05/25/2023 04/19/23   Clapacs, Jackquline Denmark, MD      Allergies    Shellfish allergy and Shellfish-derived products    Review of Systems   Review of Systems  Constitutional:  Negative for chills and fever.  Respiratory:  Negative for shortness of breath.   Cardiovascular:  Negative for chest pain.  Gastrointestinal:  Positive for blood in stool. Negative for abdominal pain.  Neurological:  Negative for headaches.    Physical Exam Updated Vital Signs BP (!) 174/84 (BP Location: Right Arm)   Pulse 99   Temp 98.9 F (37.2 C) (Oral)   Resp 16   Ht 6\' 4"  (1.93 m)   Wt 117.9 kg   SpO2 97%   BMI 31.65 kg/m  Physical Exam Vitals and nursing note reviewed. Exam conducted with a chaperone present.  Constitutional:      General: He is not in acute distress.  Appearance: He is not ill-appearing.  HENT:     Head: Normocephalic and atraumatic.     Nose: No congestion.  Eyes:     Conjunctiva/sclera: Conjunctivae normal.  Cardiovascular:     Rate and Rhythm: Normal rate and regular rhythm.     Pulses: Normal pulses.     Heart sounds: No murmur heard.    No friction rub. No gallop.  Pulmonary:     Effort: No respiratory distress.     Breath sounds: No wheezing, rhonchi or rales.  Abdominal:     Palpations: Abdomen is soft.     Tenderness: There is abdominal tenderness. There is no right CVA tenderness or left CVA tenderness.      Comments: Abdomen nondistended, soft, slight tenderness noted in the lower abdominal region without guarding or rebound has peritoneal sign negative flank tenderness or CVA tenderness.  Genitourinary:    Comments: With chaperone present rectal exam was performed, there was minimal blood around the anus, there is no noted external hemorrhoids, digital rectal exam was performed, no palpable mass, no fistulas present, patient did have some bright red blood, Hemoccult is positive. Skin:    General: Skin is warm and dry.  Neurological:     Mental Status: He is alert.  Psychiatric:        Mood and Affect: Mood normal.     ED Results / Procedures / Treatments   Labs (all labs ordered are listed, but only abnormal results are displayed) Labs Reviewed  COMPREHENSIVE METABOLIC PANEL - Abnormal; Notable for the following components:      Result Value   Glucose, Bld 100 (*)    Creatinine, Ser 1.35 (*)    Calcium 8.6 (*)    GFR, Estimated 57 (*)    All other components within normal limits  CBC WITH DIFFERENTIAL/PLATELET - Abnormal; Notable for the following components:   WBC 11.7 (*)    RBC 3.94 (*)    Hemoglobin 12.2 (*)    HCT 37.8 (*)    Neutro Abs 8.7 (*)    Abs Immature Granulocytes 0.10 (*)    All other components within normal limits  PROTIME-INR  POC OCCULT BLOOD, ED  TYPE AND SCREEN  ABO/RH    EKG None  Radiology No results found.  Procedures Procedures    Medications Ordered in ED Medications  pantoprazole (PROTONIX) injection 40 mg (40 mg Intravenous Given 06/01/23 4098)    ED Course/ Medical Decision Making/ A&P                                 Medical Decision Making Amount and/or Complexity of Data Reviewed Labs: ordered. Radiology: ordered.  Risk Prescription drug management.   This patient presents to the ED for concern of rectal bleeding, this involves an extensive number of treatment options, and is a complaint that carries with it a high risk of  complications and morbidity.  The differential diagnosis includes upper GI bleed, lower GI bleed, diverticulitis, internal hemorrhoids, external hemorrhoids, fistula    Additional history obtained:  Additional history obtained from N/A External records from outside source obtained and reviewed including behavioral health notes   Co morbidities that complicate the patient evaluation  COPD, hypertension,  Social Determinants of Health:  Geriatric    Lab Tests:  I Ordered, and personally interpreted labs.  The pertinent results include: CBC shows slight leukocytosis 11.7, hemoglobin 12.2, CMP reveals glucose 100, creatinine 1.35,  calcium 8.6, prothrombin time/INR unremarkable,   Imaging Studies ordered:  I ordered imaging studies including CT abdomen pelvis pending I independently visualized and interpreted imaging which showed pending I agree with the radiologist interpretation   Cardiac Monitoring:  The patient was maintained on a cardiac monitor.  I personally viewed and interpreted the cardiac monitored which showed an underlying rhythm of: N/A   Medicines ordered and prescription drug management:  I ordered medication including  Protonix I have reviewed the patients home medicines and have made adjustments as needed  Critical Interventions:  N/A   Reevaluation:  Presents with rectal bleeding, had some bright red blood around the anus, he is not actively bleeding, hemodynamically stable, will obtain screening lab workup for further evaluation.  Will start on Protonix due to GI bleed.  Consultations Obtained:  N/a    Test Considered:  N/a    Rule out  I have low suspicion for liver or gallbladder abnormality as she has no right upper quadrant tenderness, liver enzymes, alk phos, T bili all within normal limits.  Low suspicion for pancreatitis as lipase is within normal limits.  Low suspicion for ruptured stomach ulcer as she has no peritoneal sign  present on exam.  Low suspicion for bowel obstruction as abdomen is nondistended normal bowel sounds, so passing gas and having normal bowel movements.  Suspicion for upper GI bleed is low at this time presentation atypical, patient has bright red blood per his rectum.  Suspicion patient wills to be admitted for GI bleed is low as he is hemodynamically stable, not anticoagulate platelets, is not actively bleeding at this time.   Dispostion and problem list  Due to shift change patient will be handed off to Lorin Roemhildt PAC  Follow-up on CT scan, if unremarkable, patient does have any continued rectal bleeding, patient can be discharged home, would start him on a PPI, have him start taking stool softeners, and follow-up with GI.            Final Clinical Impression(s) / ED Diagnoses Final diagnoses:  Rectal bleeding    Rx / DC Orders ED Discharge Orders     None         Carroll Sage, PA-C 06/01/23 0710    Tilden Fossa, MD 06/02/23 [phone]

## 2023-06-01 NOTE — ED Notes (Signed)
IV removed from rt. Arm, and 2x2 applied with tape.

## 2023-06-01 NOTE — ED Triage Notes (Signed)
Pt BIBA for blood in his stool. Per pt he awoke to use the restroom and noticed blood coming from his rectum. No associated symtpoms

## 2023-06-01 NOTE — ED Notes (Signed)
Pt ambulated to the bathroom and states bleeding has decreased.

## 2023-06-02 ENCOUNTER — Telehealth: Payer: Self-pay

## 2023-06-02 NOTE — Telephone Encounter (Signed)
LVM for patient to call back [phone], or to call PCP office to schedule follow up apt. AS, CMA  

## 2023-07-12 ENCOUNTER — Emergency Department (HOSPITAL_COMMUNITY)
Admission: EM | Admit: 2023-07-12 | Discharge: 2023-07-12 | Disposition: A | Discharge status: Home / Self Care | Payer: 59 | Attending: Emergency Medicine | Admitting: Emergency Medicine

## 2023-07-12 ENCOUNTER — Other Ambulatory Visit: Payer: Self-pay

## 2023-07-12 ENCOUNTER — Encounter (HOSPITAL_COMMUNITY): Payer: Self-pay | Admitting: Emergency Medicine

## 2023-07-12 ENCOUNTER — Emergency Department (HOSPITAL_COMMUNITY): Payer: 59

## 2023-07-12 DIAGNOSIS — N189 Chronic kidney disease, unspecified: Secondary | ICD-10-CM | POA: Insufficient documentation

## 2023-07-12 DIAGNOSIS — J45909 Unspecified asthma, uncomplicated: Secondary | ICD-10-CM | POA: Insufficient documentation

## 2023-07-12 DIAGNOSIS — R0789 Other chest pain: Secondary | ICD-10-CM | POA: Insufficient documentation

## 2023-07-12 DIAGNOSIS — Z79899 Other long term (current) drug therapy: Secondary | ICD-10-CM | POA: Diagnosis not present

## 2023-07-12 DIAGNOSIS — I2510 Atherosclerotic heart disease of native coronary artery without angina pectoris: Secondary | ICD-10-CM | POA: Diagnosis not present

## 2023-07-12 DIAGNOSIS — J449 Chronic obstructive pulmonary disease, unspecified: Secondary | ICD-10-CM | POA: Diagnosis not present

## 2023-07-12 DIAGNOSIS — R0602 Shortness of breath: Secondary | ICD-10-CM | POA: Diagnosis not present

## 2023-07-12 DIAGNOSIS — F330 Major depressive disorder, recurrent, mild: Secondary | ICD-10-CM | POA: Diagnosis present

## 2023-07-12 DIAGNOSIS — R45851 Suicidal ideations: Secondary | ICD-10-CM | POA: Diagnosis not present

## 2023-07-12 DIAGNOSIS — I129 Hypertensive chronic kidney disease with stage 1 through stage 4 chronic kidney disease, or unspecified chronic kidney disease: Secondary | ICD-10-CM | POA: Insufficient documentation

## 2023-07-12 LAB — COMPREHENSIVE METABOLIC PANEL
ALT: 16 U/L (ref 0–44)
AST: 22 U/L (ref 15–41)
Albumin: 3.3 g/dL — ABNORMAL LOW (ref 3.5–5.0)
Alkaline Phosphatase: 62 U/L (ref 38–126)
Anion gap: 11 (ref 5–15)
BUN: 10 mg/dL (ref 8–23)
CO2: 19 mmol/L — ABNORMAL LOW (ref 22–32)
Calcium: 8.2 mg/dL — ABNORMAL LOW (ref 8.9–10.3)
Chloride: 106 mmol/L (ref 98–111)
Creatinine, Ser: 1.17 mg/dL (ref 0.61–1.24)
GFR, Estimated: >60 mL/min (ref >60)
Glucose, Bld: 117 mg/dL — ABNORMAL HIGH (ref 70–99)
Potassium: 4.1 mmol/L (ref 3.5–5.1)
Sodium: 136 mmol/L (ref 135–145)
Total Bilirubin: 0.6 mg/dL (ref 0.3–1.2)
Total Protein: 6.6 g/dL (ref 6.5–8.1)

## 2023-07-12 LAB — CBC
HCT: 41.7 % (ref 39.0–52.0)
Hemoglobin: 13.3 g/dL (ref 13.0–17.0)
MCH: 30 pg (ref 26.0–34.0)
MCHC: 31.9 g/dL (ref 30.0–36.0)
MCV: 94.1 fL (ref 80.0–100.0)
Platelets: 255 10*3/uL (ref 150–400)
RBC: 4.43 MIL/uL (ref 4.22–5.81)
RDW: 12.5 % (ref 11.5–15.5)
WBC: 7.9 10*3/uL (ref 4.0–10.5)
nRBC: 0 % (ref 0.0–0.2)

## 2023-07-12 LAB — RAPID URINE DRUG SCREEN, HOSP PERFORMED
Amphetamines: NOT DETECTED
Barbiturates: NOT DETECTED
Benzodiazepines: NOT DETECTED
Cocaine: POSITIVE — AB
Opiates: NOT DETECTED
Tetrahydrocannabinol: POSITIVE — AB

## 2023-07-12 LAB — ETHANOL: Alcohol, Ethyl (B): <10 mg/dL (ref <10)

## 2023-07-12 LAB — TROPONIN I (HIGH SENSITIVITY)
Troponin I (High Sensitivity): 7 ng/L (ref <18)
Troponin I (High Sensitivity): 7 ng/L (ref <18)

## 2023-07-12 LAB — SALICYLATE LEVEL: Salicylate Lvl: <7 mg/dL — ABNORMAL LOW (ref 7.0–30.0)

## 2023-07-12 LAB — ACETAMINOPHEN LEVEL: Acetaminophen (Tylenol), Serum: <10 ug/mL — ABNORMAL LOW (ref 10–30)

## 2023-07-12 MED ORDER — MIRTAZAPINE 15 MG PO TABS
15.0000 mg | ORAL_TABLET | Freq: Every day | ORAL | 0 refills | Status: DC
Start: 2023-07-12 — End: 2023-08-08

## 2023-07-12 MED ORDER — ARIPIPRAZOLE 5 MG PO TABS
5.0000 mg | ORAL_TABLET | Freq: Every day | ORAL | 0 refills | Status: DC
Start: 2023-07-12 — End: 2023-08-08

## 2023-07-12 NOTE — ED Notes (Signed)
Phlebotomy to collect troponin

## 2023-07-12 NOTE — ED Notes (Signed)
Pt belongings returned. Pt verbalized understanding of discharge paperwork, prescriptions and follow-up care. Pt given bus pass.

## 2023-07-12 NOTE — Discharge Instructions (Addendum)
COUNSELING AGENCIES in [facility])  [facility] [address] [place].  [phone]  [facility] [address] [place].    [phone]  Individual and [facility]rapists [address] [place].    [phone]   Habla Espaol/Interprete  [facility] [place] [address].    [phone]  [facility] [address]  "[place]"    [phone]   [facility] [address] [place].     [phone] [facility] (SEL) [address] [place].  [phone]  Psychiatric services/servicios psiquiatricos  [facility] [address] [place]   [phone] [facility] [address]     [phone]  [facility] [address]      [phone]   Habla Espaol/Interprete & Psychiatric services/servicios psiquiatricos  [facility] [address]  [phone]  [facility] [address] [place].     [phone]  [facility] [address]    [phone] Psychotherapeutic Services [address] (69yo & over only)     [phone]    [phone]  Provides information on mental health, intellectual/developmental disabilities & substance abuse services in [facility] Health Resources:  Intensive Outpatient Programs: [facility]      [address] [place], [place] [phone] Both a day and evening program       [facility]     [address]        [place], [place] [address] [phone]         ADS: [facility] & Drug Sv[facility] [address] [place] [phone]  [facility] ACCESS LINE: [phone] or [phone] [address] [place], [place] [address] [email or web address]   Substance Abuse  Resources: Alcohol and Drug Services  [phone] [facility] [phone] [facility] [phone] Floydene Flock [phone] Residential & Outpatient Substance Abuse Program  [phone]  Psychological Services: [facility]  [phone] [facility]  [phone] [facility], [phone] [place]. [address] [place], ACCESS LINE: [phone] or [phone], [email or web address]  Mobile Crisis Teams:                                        Therapeutic Alternatives         Mobile Crisis Care Unit [phone]             [facility] [address] Ginette Otto [phone]                                         Interventionist [address] [place] [address] [place] [phone]  Self-Help/Support Groups: Mental Health Assoc[facility] of [employer] of support groups [phone] (call for more info)  Narcotics Anonymous (NA) [facility] [address] [place] - 2 meetings at this location  Residential Treatment Programs:  [facility]      [address]        [place]       [phone]         [facility] [address] [place] [address] [place], [place]  [address] [phone]  [facility]  [address] [place], [place] [address] [phone] Admissions: 8am-3pm M-F  [facility]     [address]        [place], [place] [address]       [phone]         [facility] [address] [place], [place] [phone]  [facility] [address] [place], [place] [phone]  [facility] - Intensive Outpatient      [address]     [place], [place]       [phone]         [facility] (Add[facility] Recovery [facility]     [address] [place], [place] [phone] or [phone]  Residential Treatment  Services (RTS), Medicaid [address] [place], [place] [phone]  [facility] [address]                                               [address] [place] [phone]  [facility] Resources: [phone]               General Therapy                                                Angie Fava, PhD        [address] [place], [place] [address]         [phone]   Insurance  [facility] Behavioral   [address] [place], [place] [address] [phone]  [facility] [address] [place], [place] [address] [phone] Insurance/Medicaid/sponsorship through [facility] and Families                                              [address]                                        [place], [place] [address]    Therapy/tele-psych/case         [phone]          [facility] [address], [place]  [address]  Adolescent/group home/case management [phone]                                           Creola Corn PhD       General therapy       Insurance   [phone]         Dr. Lolly Mustache, [place], M-F [phone]  [facility]  [employer] [facility] Dept. [address]  [address]         [address] [place] [address]  Blondell Reveal Phone:  [phone]                                  Phone:  [phone]                   Phone:  [phone]  [facility], [phone] [facility]- [phone]       -     [facility] in [place], [address],             [phone], Insurance
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
## 2023-07-12 NOTE — ED Notes (Signed)
Pt to xray

## 2023-07-12 NOTE — Consult Note (Signed)
BH ED ASSESSMENT   Reason for Consult:  SI Referring Physician:  Tegeler Patient Identification: Robert Chan MRN:  [id number] ED Chief Complaint: MDD (major depressive disorder), recurrent episode, mild (HCC)  Diagnosis:  Principal Problem:   MDD (major depressive disorder), recurrent episode, mild (HCC)   ED Assessment Time Calculation: Start Time: 1600 Stop Time: 1645 Total Time in Minutes (Assessment Completion): 45   HPI:   Robert Chan is a 67 y.o. male patient admitted who presented to [facility] for chest irritation x3 days. Pt requesting tos peak with psychiatry about negative thoughts and up/down mood. Pt reports passive suicidal ideations and wanting to get back on home medications. Pt noncompliant with meds around x1 month.   Patient known to [facility] service line. Pt last inpatient admission at [facility] 04/06/23-04/19/23.   Subjective:   Patient seen at [facility], ED for face-to-face psychiatric evaluation.  Patient is pleasant and engages well in conversation.  Patient stated since he was discharged from [facility] in June he did not take any of his psychiatric medications.  He represented a month later in July presenting with suicidal ideations and medication noncompliance and was then sent to [facility] for inpatient treatment.  Patient states since discharge in early August he has not been compliant with medications again.  Patient states it has been around a month since he last took medications, he was still taking mirtazapine and Abilify.  He feels like they work well, but then when he gets home he forgets to take them or just does not want to take them.  He has no explanation for why he does not take them  He does report an adverse side effect of feeling dizzy in the morning sometimes but thinks it is because he took the mirtazapine too late at night.  Today he requested to speak to psychiatry for medication refill and to talk about his up-and-down mood.  Patient denies any suicidal  ideations.  He does report some passive suicidal ideations over the past week congruent with his up-and-down moods.  Patient did mention his mood feels more unstable when he is not taking medications.  Again we went over the importance of medication compliance and explained his mirtazapine is targeting depression and the Abilify is for additional support and mood stabilization.  Encouraged him to be compliant with medications and to follow-up with outpatient services.  He denies homicidal ideations.  Denies any auditory visual hallucinations.  He denies any access to weapons or firearms in his home. He does endorse daily alcohol use of 1-3 beers per day. Denies illicit substance use. He does have good familial support. Pt reports he does not want to stay in the hospital, and does feel safe to return home tonight.   Pt is able to contract for safety at this time, and is only requesting his psychiatric medication be restarted at home. He assured me he will follow through with establishing outpatient therapy and follow up. Assured me he has no current SI/HI/AVH. He has no concerns or questions at this time. Will send Remeron 15 mg at bedtime and Abilify 5 mg daily to preferred pharmacy. Pt is psych cleared for discharge.   Past Psychiatric History:  MDD, medication noncompliance, substance abuse  Risk to Self or Others: Is the patient at risk to self? No Has the patient been a risk to self in the past 6 months? Yes Has the patient been a risk to self within the distant past? Yes Is the patient a risk  to others? No Has the patient been a risk to others in the past 6 months? No Has the patient been a risk to others within the distant past? No  [place] Scale:  Flowsheet Row ED from 07/12/2023 in [facility] Emergency Department at [facility] ED from 06/01/2023 in [facility] Emergency Department at [facility] ED from 05/24/2023 in [facility] Emergency Department at [facility]   C-SSRS RISK CATEGORY High Risk Error: Q3, 4, or 5 should not be populated when Q2 is No High Risk       Substance Abuse:   alcohol  Past Medical History:  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   Depression    Elevated serum creatinine 08/28/2019   Homelessness 06/19/2020   Hypertension    NSTEMI (non-ST elevated myocardial infarction) [facility])    Status post incision and drainage 04/22/2021   Suicidal ideation 06/05/2020    Past Surgical History:  Procedure Laterality Date   APPENDECTOMY     EYE SURGERY     LEFT HEART CATH AND CORONARY ANGIOGRAPHY N/A 06/05/2020   Procedure: LEFT HEART CATH AND CORONARY ANGIOGRAPHY;  Surgeon: Elder Negus, MD;  Location: [facility] INVASIVE CV LAB;  Service: Cardiovascular;  Laterality: N/A;   PROSTATE SURGERY     Family History:  Family History  Problem Relation Age of Onset   Hypertension Mother    Diabetes Mother    Hypertension Father    Hypertension Sister    Social History:  Social History   Substance and Sexual Activity  Alcohol Use Yes   Comment: drinks 2x/wk, 2-3 40 oz drinks, last drink 2 weeks ago     Social History   Substance and Sexual Activity  Drug Use Not Currently   Types: Cocaine   Comment: cocaine use once a month, last use 6 mos ago    Social History   Socioeconomic History   Marital status: Single    Spouse name: Not on file   Number of children: Not on file   Years of education: Not on file   Highest education level: Not on file  Occupational History   Not on file  Tobacco Use   Smoking status: Some Days    Current packs/day: 0.00    Types: Cigarettes    Last attempt to quit: 12/25/2020    Years since quitting: 2.5    Smokeless tobacco: Never   Tobacco comments:    Smokes a couple cigarettes every now and then  Vaping Use   Vaping status: Never Used  Substance and Sexual Activity   Alcohol use: Yes    Comment: drinks 2x/wk, 2-3 40 oz drinks, last drink 2 weeks ago   Drug use: Not Currently    Types: Cocaine    Comment: cocaine use once a month, last use 6 mos ago   Sexual activity: Not Currently  Other Topics Concern   Not on file  Social History Narrative   His mother passed at age 56 (when he was 24 years old) and his father raised him and his siblings   His parents were pastors as a lot of his sisters and other family members  are today   There was a total of 13 children in his family Had 36 sisters, one sister deceased , Had 3 brothers, all brothers deceased   He is the youngest of the 96 and was "always in trouble in my younger years"   Family still live in [place], [place], some in [place], [place]   Has a Daughter and son with a total of 10 grandchildren (6 grand daughters local and 4 grandsons)    Values family   Married twice, present wife incarcerated   Social Determinants of Health   Financial Resource Strain: Low Risk  (10/19/2022)   Overall Financial Resource Strain (CARDIA)    Difficulty of Paying Living Expenses: Not hard at all  Food Insecurity: No Food Insecurity (04/06/2023)   Hunger Vital Sign    Worried About Running Out of Food in the Last Year: Never true    Ran Out of Food in the Last Year: Never true  Transportation Needs: No Transportation Needs (04/06/2023)   PRAPARE - Administrator, Civil Service (Medical): No    Lack of Transportation (Non-Medical): No  Physical Activity: Inactive (10/19/2022)   Exercise Vital Sign    Days of Exercise per Week: 0 days    Minutes of Exercise per Session: 0 min  Stress: No Stress Concern Present (10/19/2022)   [name] of Occupational Health - Occupational Stress Questionnaire    Feeling of Stress : Not at all   Social Connections: Moderately Integrated (03/31/2022)   Social Connection and Isolation Panel [NHANES]    Frequency of Communication with Friends and Family: Three times a week    Frequency of Social Gatherings with Friends and Family: Three times a week    Attends Religious Services: 1 to 4 times per year    Active Member of Clubs or Organizations: Yes    Attends Banker Meetings: 1 to 4 times per year    Marital Status: Never married     Allergies:   Allergies  Allergen Reactions   Shellfish Allergy Hives and Rash   Shellfish-Derived Products Rash    Other reaction(s): Other (See Comments)    Labs:  Results for orders placed or performed during the hospital encounter of 07/12/23 (from the past 48 hour(s))  Troponin I (High Sensitivity)     Status: None   Collection Time: 07/12/23 12:11 PM  Result Value Ref Range   Troponin I (High Sensitivity) 7 <18 ng/L    Comment: (NOTE) Elevated high sensitivity troponin I (hsTnI) values and significant  changes across serial measurements may suggest ACS but many other  chronic and acute conditions are known to elevate hsTnI results.  Refer to the "Links" section for chest pain algorithms and additional  guidance. Performed at [facility], [address] [place], [place] [address]   Comprehensive metabolic panel     Status: Abnormal   Collection Time: 07/12/23 12:11 PM  Result Value Ref Range   Sodium 136 135 - 145 mmol/L   Potassium 4.1 3.5 - 5.1 mmol/L   Chloride 106 98 - 111 mmol/L   CO2 19 (L) 22 - 32 mmol/L   Glucose, Bld 117 (H) 70 - 99 mg/dL    Comment: Glucose reference range applies only to samples taken after fasting for at least 8 hours.   BUN 10 8 - 23 mg/dL   Creatinine, Ser 6.04 0.61 - 1.24 mg/dL   Calcium 8.2 (L) 8.9 - 10.3 mg/dL   Total  Protein 6.6 6.5 - 8.1 g/dL   Albumin 3.3 (L) 3.5 - 5.0 g/dL   AST 22 15 - 41 U/L   ALT 16 0 - 44 U/L   Alkaline Phosphatase 62 38 - 126 U/L   Total  Bilirubin 0.6 0.3 - 1.2 mg/dL   GFR, Estimated >27 >03 mL/min    Comment: (NOTE) Calculated using the CKD-EPI Creatinine Equation (2021)    Anion gap 11 5 - 15    Comment: Performed at [facility], [address], [place], [place] [address]  cbc     Status: None   Collection Time: 07/12/23 12:11 PM  Result Value Ref Range   WBC 7.9 4.0 - 10.5 K/uL   RBC 4.43 4.22 - 5.81 MIL/uL   Hemoglobin 13.3 13.0 - 17.0 g/dL   HCT 81.8 29.9 - 37.1 %   MCV 94.1 80.0 - 100.0 fL   MCH 30.0 26.0 - 34.0 pg   MCHC 31.9 30.0 - 36.0 g/dL   RDW 69.6 78.9 - 38.1 %   Platelets 255 150 - 400 K/uL   nRBC 0.0 0.0 - 0.2 %    Comment: Performed at [facility], [address], [place], [place] [address]  Rapid urine drug screen (hospital performed)     Status: Abnormal   Collection Time: 07/12/23 12:11 PM  Result Value Ref Range   Opiates NONE DETECTED NONE DETECTED   Cocaine POSITIVE (A) NONE DETECTED   Benzodiazepines NONE DETECTED NONE DETECTED   Amphetamines NONE DETECTED NONE DETECTED   Tetrahydrocannabinol POSITIVE (A) NONE DETECTED   Barbiturates NONE DETECTED NONE DETECTED    Comment: (NOTE) DRUG SCREEN FOR MEDICAL PURPOSES ONLY.  IF CONFIRMATION IS NEEDED FOR ANY PURPOSE, NOTIFY LAB WITHIN 5 DAYS.  LOWEST DETECTABLE LIMITS FOR URINE DRUG SCREEN Drug Class                     Cutoff (ng/mL) Amphetamine and metabolites    1000 Barbiturate and metabolites    200 Benzodiazepine                 200 Opiates and metabolites        300 Cocaine and metabolites        300 THC                            50 Performed at [facility], [address] [place], [place] [address]   Ethanol     Status: None   Collection Time: 07/12/23 12:29 PM  Result Value Ref Range   Alcohol, Ethyl (B) <10 <10 mg/dL    Comment: (NOTE) Lowest detectable limit for serum alcohol is 10 mg/dL.  For medical purposes only. Performed at [facility], [address], [place], [place] [address]    Salicylate level     Status: Abnormal   Collection Time: 07/12/23 12:29 PM  Result Value Ref Range   Salicylate Lvl <7.0 (L) 7.0 - 30.0 mg/dL    Comment: Performed at [facility], [address] [place], [place] [address]  Acetaminophen level     Status: Abnormal   Collection Time: 07/12/23 12:29 PM  Result Value Ref Range   Acetaminophen (Tylenol), Serum <10 (L) 10 - 30 ug/mL    Comment: (NOTE) Therapeutic concentrations vary significantly. A range of 10-30 ug/mL  may be an effective concentration for many patients. However, some  are best treated at concentrations outside of this range. Acetaminophen concentrations >150 ug/mL at 4 hours after ingestion  and >50 ug/mL at 12 hours after ingestion are often associated with  toxic reactions.  Performed at [facility], [address] [place], [place] [address]     No current facility-administered medications for this encounter.   Current Outpatient Medications  Medication Sig Dispense Refill   ARIPiprazole (ABILIFY) 5 MG tablet Take 1 tablet (5 mg total) by mouth daily for 14 days. 14 tablet 0   mirtazapine (REMERON) 15 MG tablet Take 1 tablet (15 mg total) by mouth at bedtime for 14 days. 14 tablet 0   albuterol (VENTOLIN HFA) 108 (90 Base) MCG/ACT inhaler Inhale 2 puffs into the lungs every 6 (six) hours as needed for shortness of breath. 18 g 1   atorvastatin (LIPITOR) 80 MG tablet Take 1 tablet (80 mg total) by mouth daily. (Patient not taking: Reported on 05/25/2023) 30 tablet 1   diltiazem (CARDIZEM CD) 180 MG 24 hr capsule Take 1 capsule (180 mg total) by mouth daily. (Patient not taking: Reported on 05/25/2023) 30 capsule 1   hydrOXYzine (ATARAX) 25 MG tablet Take 1 tablet (25 mg total) by mouth 3 (three) times daily as needed for anxiety. (Patient not taking: Reported on 05/25/2023) 30 tablet 1   latanoprost (XALATAN) 0.005 % ophthalmic solution Place 1 drop into both eyes at bedtime. 2.5 mL 1    mometasone-formoterol (DULERA) 200-5 MCG/ACT AERO Inhale 2 puffs into the lungs 2 (two) times daily. 1 each 1   pantoprazole (PROTONIX) 20 MG tablet Take 1 tablet (20 mg total) by mouth daily. 30 tablet 0   traZODone (DESYREL) 50 MG tablet Take 1 tablet (50 mg total) by mouth at bedtime as needed for sleep. (Patient not taking: Reported on 05/25/2023) 30 tablet 1    Psychiatric Specialty Exam: Presentation  General Appearance:  Appropriate for Environment  Eye Contact: Fair  Speech: Clear and Coherent  Speech Volume: Normal  Handedness: Right   Mood and Affect  Mood: Euthymic  Affect: Congruent   Thought Process  Thought Processes: Coherent  Descriptions of Associations:Intact  Orientation:Full (Time, Place and Person)  Thought Content:WDL  History of Schizophrenia/Schizoaffective disorder:No  Duration of Psychotic Symptoms:N/A  Hallucinations:Hallucinations: None  Ideas of Reference:None  Suicidal Thoughts:Suicidal Thoughts: No  Homicidal Thoughts:Homicidal Thoughts: No   Sensorium  Memory: Immediate Fair; Remote Fair  Judgment: Fair  Insight: Fair   Art therapist  Concentration: Fair  Attention Span: Fair  Recall: Fair  Fund of Knowledge: Fair  Language: Fair   Psychomotor Activity  Psychomotor Activity: Psychomotor Activity: Normal   Assets  Assets: Desire for Improvement; Resilience; Physical Health; Social Support    Sleep  Sleep:No data recorded  Physical Exam: Physical Exam Neurological:     Mental Status: He is alert and oriented to person, place, and time.  Psychiatric:        Attention and Perception: Attention normal.        Mood and Affect: Mood normal.        Speech: Speech normal.        Behavior: Behavior is cooperative.        Thought Content: Thought content normal.    Review of Systems  Psychiatric/Behavioral:  Positive for depression and substance abuse.   All other systems reviewed  and are negative.  Blood pressure 126/80, pulse 65, temperature 97.9 F (36.6 C), resp. rate 17, height 6\' 4"  (1.93 m), weight  113.4 kg, SpO2 100%. Body mass index is 30.43 kg/m.  Medical Decision Making: Pt case reviewed and discussed with Dr. Viviano Simas. Pt does not meet criteria for IVC or inpatient psychiatric treatment.   Problem 1: MDD - Remeron 15 mg at bedtime - Abilify 5 mg daily  Additional resources provided in AVS to establish OP follow up and therapy  Disposition: No evidence of imminent risk to self or others at present.   Patient does not meet criteria for psychiatric inpatient admission. Supportive therapy provided about ongoing stressors. Discussed crisis plan, support from social network, calling 911, coming to the Emergency Department, and calling Suicide Hotline.  Eligha Bridegroom, NP 07/12/2023 4:05 PM

## 2023-07-12 NOTE — ED Triage Notes (Signed)
Patient arrives ambulatory by POV c/o chest irritation x 3 days. Patient requesting to speak with psychiatry about negative thoughts and having ups and downs. Patient reports suicidal ideations x 1 week.

## 2023-07-12 NOTE — ED Notes (Signed)
Pt belongings placed into locker number 8.

## 2023-07-12 NOTE — ED Notes (Signed)
Pt changed into scrubs and wanded by security  

## 2023-07-12 NOTE — ED Provider Notes (Signed)
  Physical Exam  BP 126/80 (BP Location: Left Arm)   Pulse 65   Temp 97.9 F (36.6 C)   Resp 17   Ht 6\' 4"  (1.93 m)   Wt 113.4 kg   SpO2 100%   BMI 30.43 kg/m   Physical Exam  Procedures  Procedures  ED Course / MDM    Medical Decision Making Amount and/or Complexity of Data Reviewed Labs: ordered. Radiology: ordered.   89M, presenting with chest pain and SI with plan. Waiting on delta trop for medical clearance. Not under IVC, voluntary.  Delta troponin was negative.  Per Derwood Kaplan, NP, the patient is psychiatrically cleared.  Patient overall with reassuring workup at this time, stable for discharge.       Ernie Avena, MD 07/12/23 [phone]

## 2023-07-12 NOTE — ED Notes (Signed)
Sitter at bedside.

## 2023-07-15 ENCOUNTER — Encounter (HOSPITAL_COMMUNITY): Payer: Self-pay | Admitting: *Deleted

## 2023-07-15 ENCOUNTER — Other Ambulatory Visit: Payer: Self-pay

## 2023-07-15 ENCOUNTER — Emergency Department (HOSPITAL_COMMUNITY)
Admission: EM | Admit: 2023-07-15 | Discharge: 2023-07-17 | Disposition: A | Discharge status: Home / Self Care | Payer: 59 | Attending: Emergency Medicine | Admitting: Emergency Medicine

## 2023-07-15 DIAGNOSIS — F209 Schizophrenia, unspecified: Secondary | ICD-10-CM | POA: Insufficient documentation

## 2023-07-15 DIAGNOSIS — Z1152 Encounter for screening for COVID-19: Secondary | ICD-10-CM | POA: Diagnosis not present

## 2023-07-15 DIAGNOSIS — R45851 Suicidal ideations: Secondary | ICD-10-CM | POA: Diagnosis not present

## 2023-07-15 DIAGNOSIS — R45850 Homicidal ideations: Secondary | ICD-10-CM | POA: Diagnosis not present

## 2023-07-15 DIAGNOSIS — I2510 Atherosclerotic heart disease of native coronary artery without angina pectoris: Secondary | ICD-10-CM | POA: Insufficient documentation

## 2023-07-15 DIAGNOSIS — J449 Chronic obstructive pulmonary disease, unspecified: Secondary | ICD-10-CM | POA: Insufficient documentation

## 2023-07-15 DIAGNOSIS — Z79899 Other long term (current) drug therapy: Secondary | ICD-10-CM | POA: Insufficient documentation

## 2023-07-15 DIAGNOSIS — I10 Essential (primary) hypertension: Secondary | ICD-10-CM | POA: Diagnosis not present

## 2023-07-15 LAB — COMPREHENSIVE METABOLIC PANEL
ALT: 17 U/L (ref 0–44)
AST: 21 U/L (ref 15–41)
Albumin: 3.6 g/dL (ref 3.5–5.0)
Alkaline Phosphatase: 64 U/L (ref 38–126)
Anion gap: 9 (ref 5–15)
BUN: 11 mg/dL (ref 8–23)
CO2: 19 mmol/L — ABNORMAL LOW (ref 22–32)
Calcium: 8.4 mg/dL — ABNORMAL LOW (ref 8.9–10.3)
Chloride: 107 mmol/L (ref 98–111)
Creatinine, Ser: 1.39 mg/dL — ABNORMAL HIGH (ref 0.61–1.24)
GFR, Estimated: 55 mL/min — ABNORMAL LOW (ref >60)
Glucose, Bld: 129 mg/dL — ABNORMAL HIGH (ref 70–99)
Potassium: 3.7 mmol/L (ref 3.5–5.1)
Sodium: 135 mmol/L (ref 135–145)
Total Bilirubin: 0.5 mg/dL (ref 0.3–1.2)
Total Protein: 6.9 g/dL (ref 6.5–8.1)

## 2023-07-15 LAB — RAPID URINE DRUG SCREEN, HOSP PERFORMED
Amphetamines: NOT DETECTED
Barbiturates: NOT DETECTED
Benzodiazepines: NOT DETECTED
Cocaine: POSITIVE — AB
Opiates: NOT DETECTED
Tetrahydrocannabinol: POSITIVE — AB

## 2023-07-15 LAB — CBC
HCT: 40.5 % (ref 39.0–52.0)
Hemoglobin: 13 g/dL (ref 13.0–17.0)
MCH: 31 pg (ref 26.0–34.0)
MCHC: 32.1 g/dL (ref 30.0–36.0)
MCV: 96.4 fL (ref 80.0–100.0)
Platelets: 267 10*3/uL (ref 150–400)
RBC: 4.2 MIL/uL — ABNORMAL LOW (ref 4.22–5.81)
RDW: 12.6 % (ref 11.5–15.5)
WBC: 9.1 10*3/uL (ref 4.0–10.5)
nRBC: 0 % (ref 0.0–0.2)

## 2023-07-15 LAB — SALICYLATE LEVEL: Salicylate Lvl: <7 mg/dL — ABNORMAL LOW (ref 7.0–30.0)

## 2023-07-15 LAB — ETHANOL: Alcohol, Ethyl (B): <10 mg/dL (ref <10)

## 2023-07-15 LAB — ACETAMINOPHEN LEVEL: Acetaminophen (Tylenol), Serum: <10 ug/mL — ABNORMAL LOW (ref 10–30)

## 2023-07-15 NOTE — ED Notes (Addendum)
Pts belongings placed in locker number 1. Un-inventoried

## 2023-07-15 NOTE — ED Provider Notes (Signed)
[place] EMERGENCY DEPARTMENT AT [facility] Provider Note   CSN: [id number] Arrival date & time: 07/15/23  1744     History  Chief Complaint  Patient presents with   Psychiatric Evaluation    Robert Chan is a 67 y.o. male.  67 year old male with past medical history significant for CAD, COPD, schizoaffective disorder who presents today for concern of SI with plan, also endorses homicidal ideation towards his daughter's boyfriend, and reports worsening audiovisual hallucinations.  He states he was seen in the emergency department on Monday and they talked him out of his suicidal ideation however he went home and has not felt better so he decided to come back.  He states he had a shotgun in his hand but could not "get it right" so he came in.  He has not taken his previously prescribed psychiatric medications in some time.  The history is provided by the patient. No language interpreter was used.       Home Medications Prior to Admission medications   Medication Sig Start Date End Date Taking? Authorizing Provider  albuterol (VENTOLIN HFA) 108 (90 Base) MCG/ACT inhaler Inhale 2 puffs into the lungs every 6 (six) hours as needed for shortness of breath. 04/19/23   Clapacs, Jackquline Denmark, MD  ARIPiprazole (ABILIFY) 5 MG tablet Take 1 tablet (5 mg total) by mouth daily for 14 days. 07/12/23 07/26/23  Eligha Bridegroom, NP  atorvastatin (LIPITOR) 80 MG tablet Take 1 tablet (80 mg total) by mouth daily. Patient not taking: Reported on 05/25/2023 04/19/23 04/19/24  Clapacs, Jackquline Denmark, MD  diltiazem (CARDIZEM CD) 180 MG 24 hr capsule Take 1 capsule (180 mg total) by mouth daily. Patient not taking: Reported on 05/25/2023 04/20/23   Clapacs, Jackquline Denmark, MD  hydrOXYzine (ATARAX) 25 MG tablet Take 1 tablet (25 mg total) by mouth 3 (three) times daily as needed for anxiety. Patient not taking: Reported on 05/25/2023 04/19/23   Clapacs, Jackquline Denmark, MD  latanoprost (XALATAN) 0.005 % ophthalmic solution Place 1  drop into both eyes at bedtime. 04/19/23   Clapacs, Jackquline Denmark, MD  mirtazapine (REMERON) 15 MG tablet Take 1 tablet (15 mg total) by mouth at bedtime for 14 days. 07/12/23 07/26/23  Eligha Bridegroom, NP  mometasone-formoterol (DULERA) 200-5 MCG/ACT AERO Inhale 2 puffs into the lungs 2 (two) times daily. 04/19/23   Clapacs, Jackquline Denmark, MD  pantoprazole (PROTONIX) 20 MG tablet Take 1 tablet (20 mg total) by mouth daily. 06/01/23   Roemhildt, Lorin T, PA-C  traZODone (DESYREL) 50 MG tablet Take 1 tablet (50 mg total) by mouth at bedtime as needed for sleep. Patient not taking: Reported on 05/25/2023 04/19/23   Clapacs, Jackquline Denmark, MD      Allergies    Shellfish allergy and Shellfish-derived products    Review of Systems   Review of Systems  Constitutional:  Negative for chills and fever.  Respiratory:  Negative for shortness of breath.   Cardiovascular:  Negative for chest pain.  Psychiatric/Behavioral:  Positive for hallucinations and suicidal ideas. Negative for self-injury and sleep disturbance.   All other systems reviewed and are negative.   Physical Exam Updated Vital Signs BP 118/84 (BP Location: Right Arm)   Pulse 87   Temp 98.7 F (37.1 C) (Oral)   Resp 18   Ht 6\' 4"  (1.93 m)   Wt 113.4 kg   SpO2 99%   BMI 30.43 kg/m  Physical Exam Vitals and nursing note reviewed.  Constitutional:  General: He is not in acute distress.    Appearance: Normal appearance. He is not ill-appearing.  HENT:     Head: Normocephalic and atraumatic.     Nose: Nose normal.  Eyes:     Conjunctiva/sclera: Conjunctivae normal.  Pulmonary:     Effort: Pulmonary effort is normal. No respiratory distress.  Musculoskeletal:        General: No deformity.  Skin:    Findings: No rash.  Neurological:     Mental Status: He is alert.  Psychiatric:        Attention and Perception: Attention normal.        Mood and Affect: Mood is depressed.        Thought Content: Thought content includes homicidal and suicidal  ideation. Thought content includes suicidal plan. Thought content does not include homicidal plan.     ED Results / Procedures / Treatments   Labs (all labs ordered are listed, but only abnormal results are displayed) Labs Reviewed  COMPREHENSIVE METABOLIC PANEL - Abnormal; Notable for the following components:      Result Value   CO2 19 (*)    Glucose, Bld 129 (*)    Creatinine, Ser 1.39 (*)    Calcium 8.4 (*)    GFR, Estimated 55 (*)    All other components within normal limits  SALICYLATE LEVEL - Abnormal; Notable for the following components:   Salicylate Lvl <7.0 (*)    All other components within normal limits  ACETAMINOPHEN LEVEL - Abnormal; Notable for the following components:   Acetaminophen (Tylenol), Serum <10 (*)    All other components within normal limits  CBC - Abnormal; Notable for the following components:   RBC 4.20 (*)    All other components within normal limits  ETHANOL  RAPID URINE DRUG SCREEN, HOSP PERFORMED    EKG None  Radiology No results found.  Procedures Procedures    Medications Ordered in ED Medications - No data to display  ED Course/ Medical Decision Making/ A&P                                 Medical Decision Making  67 year old male with schizoaffective disorder presents today for concern of suicidal ideation, homicidal ideation, and worsening audiovisual hallucinations.  He is not compliant with his home medications.  CBC without leukocytosis or anemia.  CMP shows creatinine 1.39 but no other acute concerns.  Acetaminophen, salicylate, ethanol level within normal.  EKG without acute ischemic changes.  Patient cleared for psych eval.  Will place patient in IVC given SI with plan as well as HI.   Final Clinical Impression(s) / ED Diagnoses Final diagnoses:  Suicidal ideation    Rx / DC Orders ED Discharge Orders     None         Marita [place], PA-C 07/15/23 2228    Tanda Rockers A, DO 07/16/23 0002

## 2023-07-15 NOTE — ED Triage Notes (Signed)
Sitter at the bedside.

## 2023-07-15 NOTE — ED Notes (Signed)
Pt changing into scrubs now.

## 2023-07-15 NOTE — ED Triage Notes (Signed)
The pt reports that he is suicidal he was here earlier in the week for the same thing

## 2023-07-15 NOTE — ED Notes (Signed)
Pt care taken, went to the rest room, no complaints at this time.

## 2023-07-15 NOTE — ED Notes (Signed)
Security called and pt wanded  ?

## 2023-07-16 LAB — URINALYSIS, ROUTINE W REFLEX MICROSCOPIC
Bilirubin Urine: NEGATIVE
Glucose, UA: NEGATIVE mg/dL
Hgb urine dipstick: NEGATIVE
Ketones, ur: NEGATIVE mg/dL
Leukocytes,Ua: NEGATIVE
Nitrite: NEGATIVE
Protein, ur: NEGATIVE mg/dL
Specific Gravity, Urine: >1.03 — ABNORMAL HIGH (ref 1.005–1.030)
pH: 6 (ref 5.0–8.0)

## 2023-07-16 LAB — SARS CORONAVIRUS 2 BY RT PCR: SARS Coronavirus 2 by RT PCR: NEGATIVE

## 2023-07-16 MED ORDER — ATORVASTATIN CALCIUM 80 MG PO TABS: 80.0000 mg | ORAL_TABLET | Freq: Every day | ORAL | Status: DC

## 2023-07-16 MED ORDER — FLUOXETINE HCL 20 MG PO CAPS: 20.0000 mg | ORAL_CAPSULE | Freq: Every day | ORAL | Status: DC

## 2023-07-16 MED ORDER — FLUOXETINE HCL 20 MG PO CAPS
20.0000 mg | ORAL_CAPSULE | Freq: Every day | ORAL | Status: DC
  Administered 2023-07-16 – 2023-07-17 (×2): 20 mg via ORAL
  Filled 2023-07-16 (×2): qty 1

## 2023-07-16 MED ORDER — ATORVASTATIN CALCIUM 80 MG PO TABS
80.0000 mg | ORAL_TABLET | Freq: Every day | ORAL | Status: DC
  Administered 2023-07-16 – 2023-07-17 (×2): 80 mg via ORAL
  Filled 2023-07-16 (×2): qty 1

## 2023-07-16 MED ORDER — PANTOPRAZOLE SODIUM 20 MG PO TBEC
20.0000 mg | DELAYED_RELEASE_TABLET | Freq: Every day | ORAL | Status: DC
  Administered 2023-07-16 – 2023-07-17 (×2): 20 mg via ORAL
  Filled 2023-07-16 (×2): qty 1

## 2023-07-16 MED ORDER — ALBUTEROL SULFATE (2.5 MG/3ML) 0.083% IN NEBU: 2.5000 mg | INHALATION_SOLUTION | Freq: Four times a day (QID) | RESPIRATORY_TRACT | Status: DC | PRN

## 2023-07-16 MED ORDER — PANTOPRAZOLE SODIUM 20 MG PO TBEC: 20.0000 mg | DELAYED_RELEASE_TABLET | Freq: Every day | ORAL | Status: DC

## 2023-07-16 MED ORDER — DILTIAZEM HCL ER COATED BEADS 180 MG PO CP24
180.0000 mg | ORAL_CAPSULE | Freq: Every day | ORAL | Status: DC
  Administered 2023-07-16 – 2023-07-17 (×2): 180 mg via ORAL
  Filled 2023-07-16 (×3): qty 1

## 2023-07-16 MED ORDER — ALBUTEROL SULFATE HFA 108 (90 BASE) MCG/ACT IN AERS: 2.0000 | INHALATION_SPRAY | Freq: Four times a day (QID) | RESPIRATORY_TRACT | Status: DC | PRN

## 2023-07-16 MED ORDER — ARIPIPRAZOLE 5 MG PO TABS
5.0000 mg | ORAL_TABLET | Freq: Every day | ORAL | Status: DC
  Administered 2023-07-16 – 2023-07-17 (×2): 5 mg via ORAL
  Filled 2023-07-16 (×2): qty 1

## 2023-07-16 MED ORDER — ARIPIPRAZOLE 5 MG PO TABS: 5.0000 mg | ORAL_TABLET | Freq: Every day | ORAL | Status: DC

## 2023-07-16 MED ORDER — MOMETASONE FURO-FORMOTEROL FUM 200-5 MCG/ACT IN AERO
2.0000 | INHALATION_SPRAY | Freq: Two times a day (BID) | RESPIRATORY_TRACT | Status: DC
  Administered 2023-07-16 – 2023-07-17 (×2): 2 via RESPIRATORY_TRACT
  Filled 2023-07-16: qty 8.8

## 2023-07-16 NOTE — ED Provider Notes (Signed)
Emergency Medicine Observation Re-evaluation Note  Robert Chan is a 67 y.o. male, seen on rounds today.  Pt initially presented to the ED for complaints of Psychiatric Evaluation Currently, the patient is sleeping.  Physical Exam  BP (!) 151/94 (BP Location: Left Arm)   Pulse 62   Temp 97.9 F (36.6 C) (Oral)   Resp 16   Ht 6\' 4"  (1.93 m)   Wt 113.4 kg   SpO2 100%   BMI 30.43 kg/m  Physical Exam General: Sleeping Cardiac: Extremities well-perfused Lungs: Breathing is unlabored Psych: Deferred  ED Course / MDM  EKG:   I have reviewed the labs performed to date as well as medications administered while in observation.  Recent changes in the last 24 hours include presentation to the emergency department yesterday for suicidal ideation and homicidal ideation.  Also reported worsening AVH.  He has been medically cleared.  He was evaluated by TTS, who recommends inpatient hospitalization.  He has been accepted to [facility].  Accepting is Dr. Renato Gails.  He is scheduled for transport today.  Plan  Current plan is for admission to [facility].    Gloris Manchester, MD 07/16/23 1225

## 2023-07-16 NOTE — ED Notes (Signed)
Ivc paperwork brought over and placed on patients chart.

## 2023-07-16 NOTE — ED Notes (Signed)
Spoke to Eve ,Charity fundraiser at [place] around 1625, that patient will not be leaving today, because the sheriff does not pick up patient after 1 pm on the weekend. Patient will be leaving in the morning. IVC and the Covid paper was fax to facility.

## 2023-07-16 NOTE — ED Notes (Signed)
An attempt to schedule transport with the Sheriff's Department was made for the patient to be transported to [facility].  Transportation will need to be scheduled tomorrow for the patient as it is the weekend and after hours.

## 2023-07-16 NOTE — ED Notes (Signed)
Pt care taken, would like his inhaler, told him that it is due at 10 and patient is satisfied with that. No complaints at this time.

## 2023-07-16 NOTE — Progress Notes (Signed)
BHH/BMU LCSW Progress Note   07/16/2023    9:53 AM  Robert Chan   [id number]   Type of Contact and Topic:  Psychiatric Bed Placement   Pt accepted to [facility] UNIT    Patient meets inpatient criteria per Roselyn Bering, NP  The attending provider will be Dr. Luberta Mutter   Call report to [phone]  [name], RN @ [facility] ED notified.     Pt scheduled  to arrive at [facility] AFTER 0900.    [name], MSW, LCSW-A  9:54 AM 07/16/2023

## 2023-07-16 NOTE — Progress Notes (Signed)
Per psych provider Roselyn Bering, NP pt meets inpatient behavioral health placement. This CSW has requested [facility] Kenisha Herbin,RN to review pt.   Maryjean Ka, MSW, [facility] 07/16/2023 2:48 AM

## 2023-07-16 NOTE — ED Notes (Signed)
Pt is recommended for inpatient treatment per TTS
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
## 2023-07-16 NOTE — Progress Notes (Signed)
LCSW Progress Note  [id number]   Robert Chan  07/16/2023  3:14 AM    Inpatient Behavioral Health Placement  Pt meets inpatient criteria per .Roselyn Bering, NP,  There are no available beds within [facility] system per [facility] Kenisha Herbin,RN. Referral was sent to the following facilities;   Destination  Service Provider Address Phone [facility]  [address] [place], [place] [address] [phone]  [facility]  [address] [place] [address] [phone]  [facility]  [address], [place] [address] [phone]  [facility]  [address] [facility] Regino Bellow [place] [address] [phone]  [facility]  [address] [place], [place] [address] [phone]  [facility]  [address] [place] [address] [phone]  [facility]  [address] [place] [address] [phone]  [facility]  [address] [place] [address] [phone]  [facility]  [address] [place] [address] [phone]  [facility]  [address], [place] [address] [phone]  [facility]  [address], [place] [address] [phone]  [facility]  [place] [phone]  [facility]  [address] [place] [address] [phone]  [facility]  [address] [name] [place] [phone]  [facility]  [address] [place] [address] [phone]  [facility]  [address], [place] [address] [phone]  [facility]  [address] [place] [address] [phone]  [facility]  [address], [place] [address] [phone]  [facility]  [address] [place], [place] [address] [phone]  [facility]  [address], [place] [address] [phone]  [facility]  [place] [phone]  [facility]  [address] [place] [address] [phone]  [facility]  [address] [place] [address] [phone]  [facility] [place] [address] [phone]  [facility] Patient Placement  [facility], [place] [phone]  [facility]  [address] [place] [address] [phone]  [facility] [address]  [address], [place] [address] [phone]  [facility]  [address], [place] [address] [phone]    Situation ongoing,  CSW will follow up.    Maryjean Ka, MSW, [facility] 07/16/2023 3:14 AM

## 2023-07-17 NOTE — ED Notes (Signed)
Rn called [facility] at [phone] and spoke to [place], [place] that the patient will not be coming today, because the sheriff is going in the opposite direction. If anything change the sheriff will let us know and I will update them.

## 2023-07-17 NOTE — ED Notes (Signed)
Report was given Asher Muir, Charity fundraiser at [facility]

## 2023-07-17 NOTE — ED Notes (Signed)
I spoke with the nurse on duty to relay the message concerning the patient's transportation request.  The nurse Gloriajean Dell) informed me that the Sheriff's office called her directly and informed her that transportation has been arranged and will be here at or near 10:40 a.m.

## 2023-07-17 NOTE — ED Notes (Signed)
A voicemail was left with the Sheriff's Department to schedule transportation to [facility].

## 2023-07-17 NOTE — ED Notes (Signed)
Call [facility] to let them know the patient is on the way

## 2023-07-26 NOTE — Congregational Nurse Program (Signed)
Dept: [phone]   Congregational Nurse Program Note  Date of Encounter: 07/26/2023  Clinic visit to check blood pressure and blood glucose.  BP 123/83, pulse 80 and regular.  Has had non-productive cough, temperature 98.4, respirations 16, no wheezing or rales with lung auscultation. Past Medical History: 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   Depression    Elevated serum creatinine 08/28/2019   Homelessness 06/19/2020   Homicidal ideations 05/25/2023   Hypertension    Major depressive disorder, recurrent episode, 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/11/2022   Major depressive disorder, single episode, 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/10/2022   MDD (major depressive disorder), recurrent severe, 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 Yes    Student Assistance N/A    Location Patient [place]    Visit Setting with Client Organization    Patient Status Unknown   Has apartment at [facility]    Insurance/Financial Assistance Referral N/A    Medication N/A    Medical Provider Yes    Screening Referrals Made N/A    Medical Referrals Made N/A    Medical Appointment Made N/A    Recently w/o PCP, now 1st time PCP visit completed due to CNs referral or appointment made N/A    Food N/A    Transportation Need transportation assistance     Housing/Utilities N/A    Interpersonal Safety N/A    Interventions Advocate/Support;Counsel;Educate;Case Management    Abnormal to Normal Screening Since Last CN Visit N/A    Screenings CN Performed Blood Pressure;Blood Glucose;Pulse Ox;Temperature    Sent Client to Lab for: N/A    Did client attend any of the following based off CNs referral or appointments made? N/A    ED Visit Averted Yes    Life-Saving Intervention Made N/A               Dept: [phone]   Congregational Nurse Program Note  Date of Encounter: 07/26/2023  Past Medical History: 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   Depression    Elevated serum creatinine 08/28/2019   Homelessness 06/19/2020   Homicidal ideations 05/25/2023   Hypertension    Major depressive disorder, recurrent episode, 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 Yes    Student Assistance N/A    Location Patient [facility]    Visit Setting with Client Organization    Patient Status Unknown   Has apartment at [facility]    Insurance/Financial Assistance Referral N/A    Medication N/A    Medical Provider Yes     Screening Referrals Made N/A    Medical Referrals Made N/A    Medical Appointment Made N/A    Recently w/o PCP, now 1st time PCP visit completed due to CNs referral or appointment made N/A    Food N/A    Transportation Need transportation assistance    Housing/Utilities N/A    Interpersonal Safety N/A    Interventions Advocate/Support;Counsel;Educate;Case Management    Abnormal to Normal Screening Since Last CN Visit N/A    Screenings CN Performed Blood Pressure;Blood Glucose;Pulse Ox;Temperature    Sent Client to Lab for: N/A    Did client attend any of the following based off CNs referral or appointments made? N/A    ED Visit Averted Yes    Life-Saving Intervention Made N/A               Dept: [phone]   Congregational Nurse Program Note  Date of Encounter: 07/26/2023  Clinic visit for complaint of fatigue, no specific pain or discomfort, no dizziness or chest pain.  States that he had eaten lunch.  BP 123/83, pulse 80 and regular, respirations 16, O2 Sat 97%, temperature 98.4.  No cough or congestion noted, has used inhaler for his asthma as prescribed. 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 Yes    Student Assistance N/A    Location Patient [place]    Visit Setting with Client Organization    Patient Status Unknown   Has apartment at [facility]    Insurance/Financial Assistance Referral N/A    Medication N/A    Medical Provider Yes    Screening Referrals Made N/A    Medical Referrals Made N/A    Medical Appointment Made N/A    Recently w/o PCP, now 1st time PCP visit completed due to CNs referral or appointment made N/A  Food N/A    Transportation Need transportation assistance    Housing/Utilities N/A    Interpersonal Safety N/A    Interventions Advocate/Support;Counsel;Educate;Case Management    Abnormal to Normal Screening Since Last CN Visit N/A    Screenings CN Performed Blood Pressure;Blood Glucose;Pulse Ox;Temperature    Sent Client to Lab for: N/A    Did client attend any of the following based off CNs referral or appointments made? N/A    ED Visit Averted Yes    Life-Saving Intervention Made N/A

## 2023-08-08 ENCOUNTER — Other Ambulatory Visit: Payer: Self-pay

## 2023-08-08 ENCOUNTER — Inpatient Hospital Stay
Admission: AD | Admit: 2023-08-08 | Discharge: 2023-08-22 | DRG: 885 | Disposition: A | Discharge status: Home / Self Care | Payer: 59 | Source: Intra-hospital | Attending: Psychiatry | Admitting: Psychiatry

## 2023-08-08 ENCOUNTER — Encounter (HOSPITAL_COMMUNITY): Payer: Self-pay

## 2023-08-08 ENCOUNTER — Emergency Department (HOSPITAL_COMMUNITY)
Admission: EM | Admit: 2023-08-08 | Discharge: 2023-08-08 | Disposition: A | Discharge status: Other Acute Inpatient Hospital | Payer: 59 | Attending: Emergency Medicine | Admitting: Emergency Medicine

## 2023-08-08 DIAGNOSIS — Z91013 Allergy to seafood: Secondary | ICD-10-CM | POA: Diagnosis not present

## 2023-08-08 DIAGNOSIS — R4587 Impulsiveness: Secondary | ICD-10-CM | POA: Diagnosis present

## 2023-08-08 DIAGNOSIS — F1490 Cocaine use, unspecified, uncomplicated: Secondary | ICD-10-CM | POA: Diagnosis present

## 2023-08-08 DIAGNOSIS — F332 Major depressive disorder, recurrent severe without psychotic features: Secondary | ICD-10-CM

## 2023-08-08 DIAGNOSIS — Z79899 Other long term (current) drug therapy: Secondary | ICD-10-CM

## 2023-08-08 DIAGNOSIS — F1910 Other psychoactive substance abuse, uncomplicated: Secondary | ICD-10-CM | POA: Diagnosis not present

## 2023-08-08 DIAGNOSIS — Z91199 Patient's noncompliance with other medical treatment and regimen due to unspecified reason: Secondary | ICD-10-CM

## 2023-08-08 DIAGNOSIS — Z1152 Encounter for screening for COVID-19: Secondary | ICD-10-CM

## 2023-08-08 DIAGNOSIS — F259 Schizoaffective disorder, unspecified: Secondary | ICD-10-CM | POA: Diagnosis present

## 2023-08-08 DIAGNOSIS — Z833 Family history of diabetes mellitus: Secondary | ICD-10-CM | POA: Diagnosis not present

## 2023-08-08 DIAGNOSIS — N189 Chronic kidney disease, unspecified: Secondary | ICD-10-CM | POA: Diagnosis present

## 2023-08-08 DIAGNOSIS — G479 Sleep disorder, unspecified: Secondary | ICD-10-CM | POA: Diagnosis present

## 2023-08-08 DIAGNOSIS — I129 Hypertensive chronic kidney disease with stage 1 through stage 4 chronic kidney disease, or unspecified chronic kidney disease: Secondary | ICD-10-CM | POA: Insufficient documentation

## 2023-08-08 DIAGNOSIS — J449 Chronic obstructive pulmonary disease, unspecified: Secondary | ICD-10-CM | POA: Insufficient documentation

## 2023-08-08 DIAGNOSIS — I2510 Atherosclerotic heart disease of native coronary artery without angina pectoris: Secondary | ICD-10-CM | POA: Diagnosis present

## 2023-08-08 DIAGNOSIS — J4489 Other specified chronic obstructive pulmonary disease: Secondary | ICD-10-CM | POA: Diagnosis present

## 2023-08-08 DIAGNOSIS — J45909 Unspecified asthma, uncomplicated: Secondary | ICD-10-CM | POA: Diagnosis not present

## 2023-08-08 DIAGNOSIS — Z7951 Long term (current) use of inhaled steroids: Secondary | ICD-10-CM

## 2023-08-08 DIAGNOSIS — Z5900 Homelessness unspecified: Secondary | ICD-10-CM

## 2023-08-08 DIAGNOSIS — I252 Old myocardial infarction: Secondary | ICD-10-CM

## 2023-08-08 DIAGNOSIS — F17210 Nicotine dependence, cigarettes, uncomplicated: Secondary | ICD-10-CM | POA: Insufficient documentation

## 2023-08-08 DIAGNOSIS — R4589 Other symptoms and signs involving emotional state: Secondary | ICD-10-CM | POA: Diagnosis present

## 2023-08-08 DIAGNOSIS — R45851 Suicidal ideations: Secondary | ICD-10-CM | POA: Diagnosis present

## 2023-08-08 DIAGNOSIS — Z5982 Transportation insecurity: Secondary | ICD-10-CM | POA: Diagnosis not present

## 2023-08-08 DIAGNOSIS — Z8249 Family history of ischemic heart disease and other diseases of the circulatory system: Secondary | ICD-10-CM

## 2023-08-08 DIAGNOSIS — Z23 Encounter for immunization: Secondary | ICD-10-CM | POA: Diagnosis present

## 2023-08-08 DIAGNOSIS — Z20822 Contact with and (suspected) exposure to covid-19: Secondary | ICD-10-CM | POA: Diagnosis not present

## 2023-08-08 DIAGNOSIS — F419 Anxiety disorder, unspecified: Secondary | ICD-10-CM | POA: Diagnosis present

## 2023-08-08 DIAGNOSIS — R45850 Homicidal ideations: Secondary | ICD-10-CM | POA: Diagnosis present

## 2023-08-08 LAB — ACETAMINOPHEN LEVEL: Acetaminophen (Tylenol), Serum: <10 ug/mL — ABNORMAL LOW (ref 10–30)

## 2023-08-08 LAB — CBC
HCT: 44 % (ref 39.0–52.0)
Hemoglobin: 14 g/dL (ref 13.0–17.0)
MCH: 30.6 pg (ref 26.0–34.0)
MCHC: 31.8 g/dL (ref 30.0–36.0)
MCV: 96.1 fL (ref 80.0–100.0)
Platelets: 249 10*3/uL (ref 150–400)
RBC: 4.58 MIL/uL (ref 4.22–5.81)
RDW: 12.6 % (ref 11.5–15.5)
WBC: 9.9 10*3/uL (ref 4.0–10.5)
nRBC: 0 % (ref 0.0–0.2)

## 2023-08-08 LAB — COMPREHENSIVE METABOLIC PANEL
ALT: 18 U/L (ref 0–44)
AST: 25 U/L (ref 15–41)
Albumin: 3.4 g/dL — ABNORMAL LOW (ref 3.5–5.0)
Alkaline Phosphatase: 76 U/L (ref 38–126)
Anion gap: 10 (ref 5–15)
BUN: 9 mg/dL (ref 8–23)
CO2: 20 mmol/L — ABNORMAL LOW (ref 22–32)
Calcium: 8.5 mg/dL — ABNORMAL LOW (ref 8.9–10.3)
Chloride: 107 mmol/L (ref 98–111)
Creatinine, Ser: 1.43 mg/dL — ABNORMAL HIGH (ref 0.61–1.24)
GFR, Estimated: 53 mL/min — ABNORMAL LOW (ref >60)
Glucose, Bld: 148 mg/dL — ABNORMAL HIGH (ref 70–99)
Potassium: 3.9 mmol/L (ref 3.5–5.1)
Sodium: 137 mmol/L (ref 135–145)
Total Bilirubin: 0.7 mg/dL (ref 0.3–1.2)
Total Protein: 7.1 g/dL (ref 6.5–8.1)

## 2023-08-08 LAB — RAPID URINE DRUG SCREEN, HOSP PERFORMED
Amphetamines: NOT DETECTED
Barbiturates: NOT DETECTED
Benzodiazepines: NOT DETECTED
Cocaine: POSITIVE — AB
Opiates: NOT DETECTED
Tetrahydrocannabinol: NOT DETECTED

## 2023-08-08 LAB — ETHANOL: Alcohol, Ethyl (B): <10 mg/dL (ref <10)

## 2023-08-08 LAB — SARS CORONAVIRUS 2 BY RT PCR: SARS Coronavirus 2 by RT PCR: NEGATIVE

## 2023-08-08 LAB — SALICYLATE LEVEL: Salicylate Lvl: <7 mg/dL — ABNORMAL LOW (ref 7.0–30.0)

## 2023-08-08 MED ORDER — ALUM & MAG HYDROXIDE-SIMETH 200-200-20 MG/5ML PO SUSP: 30.0000 mL | ORAL | Status: DC | PRN

## 2023-08-08 MED ORDER — MAGNESIUM HYDROXIDE 400 MG/5ML PO SUSP: 30.0000 mL | Freq: Every day | ORAL | Status: DC | PRN

## 2023-08-08 MED ORDER — MIRTAZAPINE 15 MG PO TBDP: 45.0000 mg | ORAL_TABLET | Freq: Every day | ORAL | Status: DC

## 2023-08-08 MED ORDER — ACETAMINOPHEN 325 MG PO TABS
650.0000 mg | ORAL_TABLET | Freq: Four times a day (QID) | ORAL | Status: DC | PRN
  Administered 2023-08-12 – 2023-08-18 (×4): 650 mg via ORAL
  Filled 2023-08-08 (×4): qty 2

## 2023-08-08 MED ORDER — ARIPIPRAZOLE 10 MG PO TABS: 10.0000 mg | ORAL_TABLET | Freq: Every day | ORAL | Status: DC

## 2023-08-08 MED ORDER — TRAZODONE HCL 50 MG PO TABS: 50.0000 mg | ORAL_TABLET | Freq: Every evening | ORAL | Status: DC | PRN

## 2023-08-08 MED ORDER — ARIPIPRAZOLE 5 MG PO TABS
10.0000 mg | ORAL_TABLET | Freq: Every day | ORAL | Status: DC
  Administered 2023-08-09 – 2023-08-15 (×7): 10 mg via ORAL
  Filled 2023-08-08 (×7): qty 2

## 2023-08-08 MED ORDER — HALOPERIDOL 5 MG PO TABS: 5.0000 mg | ORAL_TABLET | Freq: Three times a day (TID) | ORAL | Status: DC | PRN

## 2023-08-08 MED ORDER — LORAZEPAM 2 MG/ML IJ SOLN: 2.0000 mg | Freq: Three times a day (TID) | INTRAMUSCULAR | Status: DC | PRN

## 2023-08-08 MED ORDER — MOMETASONE FURO-FORMOTEROL FUM 200-5 MCG/ACT IN AERO
2.0000 | INHALATION_SPRAY | Freq: Two times a day (BID) | RESPIRATORY_TRACT | Status: DC
  Administered 2023-08-09 – 2023-08-22 (×27): 2 via RESPIRATORY_TRACT
  Filled 2023-08-08 (×2): qty 8.8

## 2023-08-08 MED ORDER — HYDROXYZINE HCL 25 MG PO TABS: 25.0000 mg | ORAL_TABLET | Freq: Three times a day (TID) | ORAL | Status: DC | PRN

## 2023-08-08 MED ORDER — DIPHENHYDRAMINE HCL 50 MG/ML IJ SOLN: 50.0000 mg | Freq: Three times a day (TID) | INTRAMUSCULAR | Status: DC | PRN

## 2023-08-08 MED ORDER — MIRTAZAPINE 30 MG PO TBDP
45.0000 mg | ORAL_TABLET | Freq: Every day | ORAL | Status: DC
  Administered 2023-08-08: 45 mg via ORAL
  Filled 2023-08-08: qty 1

## 2023-08-08 MED ORDER — DIPHENHYDRAMINE HCL 25 MG PO CAPS
50.0000 mg | ORAL_CAPSULE | Freq: Three times a day (TID) | ORAL | Status: DC | PRN
  Filled 2023-08-08: qty 2

## 2023-08-08 MED ORDER — HALOPERIDOL LACTATE 5 MG/ML IJ SOLN: 5.0000 mg | Freq: Three times a day (TID) | INTRAMUSCULAR | Status: DC | PRN

## 2023-08-08 MED ORDER — LORAZEPAM 1 MG PO TABS: 2.0000 mg | ORAL_TABLET | Freq: Three times a day (TID) | ORAL | Status: DC | PRN

## 2023-08-08 MED ORDER — ALBUTEROL SULFATE HFA 108 (90 BASE) MCG/ACT IN AERS: 2.0000 | INHALATION_SPRAY | Freq: Four times a day (QID) | RESPIRATORY_TRACT | Status: DC | PRN
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
## 2023-08-08 NOTE — ED Triage Notes (Addendum)
Pt endorses SI, pt states will overdose on pill, use a gun or jump in traffic

## 2023-08-08 NOTE — Group Note (Signed)
Date:  08/08/2023 Time:  10:31 PM  Group Topic/Focus:  Wellness Toolbox:   The focus of this group is to discuss various aspects of wellness, balancing those aspects and exploring ways to increase the ability to experience wellness.  Patients will create a wellness toolbox for use upon discharge.    Participation Level:  Did Not Attend  Participation Quality:      Affect:      Cognitive:      Insight: None  Engagement in Group:  None  Modes of Intervention:      Additional Comments:    [name] 08/08/2023, 10:31 PM

## 2023-08-08 NOTE — ED Provider Notes (Signed)
[place] EMERGENCY DEPARTMENT AT [facility] Provider Note   CSN: [id number] Arrival date & time: 08/08/23  1113     History  No chief complaint on file.   Robert Chan is a 67 y.o. male.  HPI Patient presents with concern of worsening depression and ongoing suicidal ideation.  To nursing he described plan, to me he has none, states that he has been feeling increasingly despondent over the past few days.  No physical pain, no accidents, no obvious trigger.  He notes that in spite of taking his medications he has similar episodes periodically, last several months ago. He has been compliant with his medications. He denies any other concerns.    Home Medications Prior to Admission medications   Medication Sig Start Date End Date Taking? Authorizing Provider  albuterol (VENTOLIN HFA) 108 (90 Base) MCG/ACT inhaler Inhale 2 puffs into the lungs every 6 (six) hours as needed for shortness of breath. 04/19/23   Clapacs, Jackquline Denmark, MD  ARIPiprazole (ABILIFY) 5 MG tablet Take 1 tablet (5 mg total) by mouth daily for 14 days. Patient not taking: Reported on 07/15/2023 07/12/23 07/26/23  Eligha Bridegroom, NP  atorvastatin (LIPITOR) 80 MG tablet Take 1 tablet (80 mg total) by mouth daily. Patient not taking: Reported on 07/15/2023 04/19/23 04/19/24  Clapacs, Jackquline Denmark, MD  diltiazem (CARDIZEM CD) 180 MG 24 hr capsule Take 1 capsule (180 mg total) by mouth daily. Patient not taking: Reported on 05/25/2023 04/20/23   Clapacs, Jackquline Denmark, MD  FLUoxetine (PROZAC) 20 MG capsule Take 20 mg by mouth daily. Patient not taking: Reported on 07/15/2023 05/31/23   [provider]  hydrOXYzine (ATARAX) 25 MG tablet Take 1 tablet (25 mg total) by mouth 3 (three) times daily as needed for anxiety. Patient not taking: Reported on 05/25/2023 04/19/23   Clapacs, Jackquline Denmark, MD  latanoprost (XALATAN) 0.005 % ophthalmic solution Place 1 drop into both eyes at bedtime. Patient not taking: Reported on 07/15/2023  04/19/23   Clapacs, Jackquline Denmark, MD  mirtazapine (REMERON) 15 MG tablet Take 1 tablet (15 mg total) by mouth at bedtime for 14 days. Patient not taking: Reported on 07/15/2023 07/12/23 07/26/23  Eligha Bridegroom, NP  mometasone-formoterol (DULERA) 200-5 MCG/ACT AERO Inhale 2 puffs into the lungs 2 (two) times daily. 04/19/23   Clapacs, Jackquline Denmark, MD  pantoprazole (PROTONIX) 20 MG tablet Take 1 tablet (20 mg total) by mouth daily. Patient not taking: Reported on 07/15/2023 06/01/23   Roemhildt, Lorin T, PA-C  traZODone (DESYREL) 50 MG tablet Take 1 tablet (50 mg total) by mouth at bedtime as needed for sleep. Patient not taking: Reported on 05/25/2023 04/19/23   Clapacs, Jackquline Denmark, MD      Allergies    Shellfish allergy and Shellfish-derived products    Review of Systems   Review of Systems  All other systems reviewed and are negative.   Physical Exam Updated Vital Signs BP 120/74   Pulse 86   Temp (!) 97.5 F (36.4 C) (Oral)   Resp 17   Ht 6\' 4"  (1.93 m)   Wt 113.4 kg   SpO2 97%   BMI 30.43 kg/m  Physical Exam Vitals and nursing note reviewed.  Constitutional:      General: He is not in acute distress.    Appearance: He is well-developed.  HENT:     Head: Normocephalic and atraumatic.  Eyes:     Conjunctiva/sclera: Conjunctivae normal.  Pulmonary:     Effort: Pulmonary effort is  normal. No respiratory distress.     Breath sounds: No stridor.  Abdominal:     General: There is no distension.  Skin:    General: Skin is warm and dry.  Neurological:     Mental Status: He is alert and oriented to person, place, and time.  Psychiatric:        Mood and Affect: Mood normal.        Thought Content: Thought content includes suicidal ideation.     ED Results / Procedures / Treatments   Labs (all labs ordered are listed, but only abnormal results are displayed) Labs Reviewed  COMPREHENSIVE METABOLIC PANEL - Abnormal; Notable for the following components:      Result Value   CO2 20 (*)     Glucose, Bld 148 (*)    Creatinine, Ser 1.43 (*)    Calcium 8.5 (*)    Albumin 3.4 (*)    GFR, Estimated 53 (*)    All other components within normal limits  SALICYLATE LEVEL - Abnormal; Notable for the following components:   Salicylate Lvl <7.0 (*)    All other components within normal limits  ACETAMINOPHEN LEVEL - Abnormal; Notable for the following components:   Acetaminophen (Tylenol), Serum <10 (*)    All other components within normal limits  ETHANOL  CBC  RAPID URINE DRUG SCREEN, HOSP PERFORMED    EKG None  Radiology No results found.  Procedures Procedures    Medications Ordered in ED Medications - No data to display  ED Course/ Medical Decision Making/ A&P                                 Medical Decision Making Patient with multiple medical including depression, prior hospitalizations, COPD now presents with suicidal ideation.  Given his age, history of pression, elevated risk profile, patient had medical clearance with labs, physical exam.  Absent physical complaints with unremarkable vital signs and reassuring labs, patient appropriate for behavioral health evaluation for ongoing behavioral health management.  Amount and/or Complexity of Data Reviewed External Data Reviewed: notes.    Details: Prior psych notes reviewed Labs: ordered. Decision-making details documented in ED Course.  Risk Prescription drug management. Decision regarding hospitalization. Diagnosis or treatment significantly limited by social determinants of health.  UA pending on transition to psych hold.  Final Clinical Impression(s) / ED Diagnoses Final diagnoses:  Suicidal ideation    Rx / DC Orders ED Discharge Orders     None         Gerhard Munch, MD 08/08/23 1539

## 2023-08-08 NOTE — ED Notes (Signed)
Report given to Converse H RN at [facility] via phone at this time. [phone] Awaiting bed at [facility] to be ready at this time.

## 2023-08-08 NOTE — ED Notes (Signed)
Pt just arrived to unit in hallway bed. Assumed care of pt at this time.

## 2023-08-08 NOTE — ED Notes (Signed)
Pt belonging was placed in locker #4

## 2023-08-08 NOTE — ED Notes (Signed)
ED Provider at bedside. 

## 2023-08-09 ENCOUNTER — Encounter: Payer: Self-pay | Admitting: Psychiatry

## 2023-08-09 DIAGNOSIS — F332 Major depressive disorder, recurrent severe without psychotic features: Secondary | ICD-10-CM | POA: Diagnosis not present

## 2023-08-09 MED ORDER — LATANOPROST 0.005 % OP SOLN
1.0000 [drp] | Freq: Every day | OPHTHALMIC | Status: DC
  Administered 2023-08-09 – 2023-08-21 (×13): 1 [drp] via OPHTHALMIC
  Filled 2023-08-09: qty 2.5

## 2023-08-09 MED ORDER — DILTIAZEM HCL ER COATED BEADS 180 MG PO CP24
180.0000 mg | ORAL_CAPSULE | Freq: Every day | ORAL | Status: DC
  Administered 2023-08-09 – 2023-08-22 (×14): 180 mg via ORAL
  Filled 2023-08-09 (×15): qty 1

## 2023-08-09 MED ORDER — TRAZODONE HCL 100 MG PO TABS
100.0000 mg | ORAL_TABLET | Freq: Every evening | ORAL | Status: DC | PRN
  Administered 2023-08-09: 100 mg via ORAL
  Filled 2023-08-09: qty 1

## 2023-08-09 MED ORDER — DOXEPIN HCL 50 MG PO CAPS
50.0000 mg | ORAL_CAPSULE | Freq: Every day | ORAL | Status: DC
  Administered 2023-08-09 – 2023-08-18 (×10): 50 mg via ORAL
  Filled 2023-08-09 (×10): qty 1

## 2023-08-09 MED ORDER — ATORVASTATIN CALCIUM 80 MG PO TABS
80.0000 mg | ORAL_TABLET | Freq: Every day | ORAL | Status: DC
  Administered 2023-08-09 – 2023-08-22 (×14): 80 mg via ORAL
  Filled 2023-08-09 (×14): qty 1

## 2023-08-09 MED ORDER — HYDROXYZINE HCL 25 MG PO TABS
50.0000 mg | ORAL_TABLET | Freq: Three times a day (TID) | ORAL | Status: DC | PRN
  Administered 2023-08-09 – 2023-08-21 (×8): 50 mg via ORAL
  Filled 2023-08-09 (×8): qty 2

## 2023-08-09 NOTE — Progress Notes (Signed)
Patient with sad affect.  Endorses SI, depression and hopelessness. Denies anxiety and AVH. Contracts for safety on the unit.    Compliant with scheduled medications. 15 min checks in place for safety.  Patient is present in the milieu.  Appropriate interaction with peers and staff.

## 2023-08-09 NOTE — Plan of Care (Signed)
  Problem: Education: Goal: Knowledge of General Education information will improve Description: Including pain rating scale, medication(s)/side effects and non-pharmacologic comfort measures Outcome: Progressing   Problem: Clinical Measurements: Goal: Will remain free from infection Outcome: Progressing   Problem: Safety: Goal: Ability to remain free from injury will improve Outcome: Progressing   

## 2023-08-09 NOTE — Progress Notes (Signed)
   08/09/23 0601  15 Minute Checks  Location Bedroom  Visual Appearance Calm  Behavior Sleeping  Sleep (Behavioral Health Patients Only)  Calculate sleep? (Click Yes once per 24 hr at 0600 safety check) Yes  Documented sleep last 24 hours 5.5

## 2023-08-09 NOTE — Progress Notes (Signed)
   Initial Treatment Plan 08/09/23 12:12 AM Robert Chan  MRN: [id number]       PATIENT STRESSORS: Suicidal  Depression Substance abuse   Medication noncompliant   Grieving of family members      PATIENT STRENGTHS: Ability for insight  Communication skills  Ability of awareness and acknowledgement      PATIENT IDENTIFIED PROBLEMS: Poor coping skills  Substance use   Depression  Anxiety   Suicidal ideations   Suicidal attempt   3 of his brother committed suicide             DISCHARGE CRITERIA:  Ability to meet basic life and health needs Improved stabilization in mood, thinking, and/or behavior Verbal commitment to aftercare and medication compliance   PRELIMINARY DISCHARGE PLAN: Outpatient therapy Return to previous living arrangement   PATIENT/FAMILY INVOLVEMENT: This treatment plan has been presented to and reviewed with the patient, Robert Chan, The patient has been given the opportunity to ask questions and make suggestions.   Robert Chan, [place] 08/09/23 12:29 AM

## 2023-08-09 NOTE — Group Note (Signed)
Recreation Therapy Group Note   Group Topic:Relaxation  Group Date: 08/09/2023 Start Time: 1400 End Time: 1455 Facilitators: Rosina Lowenstein, LRT, CTRS Location: Courtyard  Group Description: Meditation. LRT and patients discussed what they know about meditation and mindfulness. LRT played a Deep Breathing Meditation exercise script for patients to follow along to. LRT and patients discussed how meditation and deep breathing can be used as a coping skill post--discharge. After the meditation, LRT took song requests from pts to listen to while getting fresh air and sunlight.  Goal Area(s) Addressed: Patient will practice using relaxation technique. Patient will identify a new coping skill.  Patient will follow multistep directions to reduce anxiety and stress.   Affect/Mood: Appropriate   Participation Level: Active and Engaged   Participation Quality: Independent   Behavior: Calm and Cooperative   Speech/Thought Process: Coherent   Insight: Good   Judgement: Good   Modes of Intervention: Activity   Patient Response to Interventions:  Attentive and Receptive   Education Outcome:  Acknowledges education   Clinical Observations/Individualized Feedback: Jovaun was active in their participation of session activities and group discussion. Pt followed along with the prompt appropriately. Pt interacted well with LRT and peers duration of session.   Plan: Continue to engage patient in RT group sessions 2-3x/week.   Rosina Lowenstein, LRT, CTRS 08/09/2023 2:59 PM

## 2023-08-09 NOTE — Group Note (Signed)
Date:  08/09/2023 Time:  10:50 PM  Group Topic/Focus:  Personal Choices and Values:   The focus of this group is to help patients assess and explore the importance of values in their lives, how their values affect their decisions, how they express their values and what opposes their expression.    Participation Level:  Active  Participation Quality:  Appropriate  Affect:  Appropriate  Cognitive:  Alert  Insight: Appropriate  Engagement in Group:  Engaged  Modes of Intervention:  Discussion  Additional Comments:    Robert Chan 08/09/2023, 10:50 PM
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
## 2023-08-09 NOTE — Progress Notes (Signed)
   08/09/23 0000  Psych Admission Type (Psych Patients Only)  Admission Status Voluntary  Psychosocial Assessment  Patient Complaints Depression;Hopelessness;Self-harm thoughts  Eye Contact Fair  Facial Expression Flat  Affect Depressed;Sad  Speech Logical/coherent  Interaction Assertive  Motor Activity Other (Comment) (WNL)  Appearance/Hygiene In scrubs  Behavior Characteristics Cooperative  Mood Depressed;Sad;Pleasant  Thought Process  Coherency WDL  Content WDL  Delusions None reported or observed  Perception WDL  Hallucination None reported or observed  Judgment WDL  Confusion None  Danger to Self  Current suicidal ideation? Passive  Agreement Not to Harm Self Yes  Description of Agreement Verbal  Danger to Others  Danger to Others None reported or observed
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
## 2023-08-09 NOTE — Group Note (Signed)
Date:  08/09/2023 Time:  11:03 AM  Group Topic/Focus:  Outside Rec /Music Therapy This group is to allow patients to get fresh air while doing outside activities while also listening to music     Participation Level:  Active  Participation Quality:  Appropriate  Affect:  Appropriate  Cognitive:  Alert and Appropriate  Insight: Appropriate  Engagement in Group:  Engaged  Modes of Intervention:  Activity and Confrontation  Additional Comments:    Marta Antu 08/09/2023, 11:03 AM

## 2023-08-10 DIAGNOSIS — F332 Major depressive disorder, recurrent severe without psychotic features: Secondary | ICD-10-CM | POA: Diagnosis not present

## 2023-08-10 MED ORDER — POLYETHYLENE GLYCOL 3350 17 G PO PACK
17.0000 g | PACK | Freq: Every day | ORAL | Status: DC
  Administered 2023-08-10 – 2023-08-22 (×13): 17 g via ORAL
  Filled 2023-08-10 (×13): qty 1

## 2023-08-10 MED ORDER — TRAZODONE HCL 50 MG PO TABS
50.0000 mg | ORAL_TABLET | Freq: Every evening | ORAL | Status: DC | PRN
  Administered 2023-08-10 – 2023-08-21 (×11): 50 mg via ORAL
  Filled 2023-08-10 (×12): qty 1

## 2023-08-10 NOTE — Progress Notes (Signed)
   08/10/23 2100  Psych Admission Type (Psych Patients Only)  Admission Status Voluntary  Psychosocial Assessment  Patient Complaints Depression  Eye Contact Fair  Facial Expression Flat  Affect Sad  Speech Logical/coherent  Interaction Assertive  Motor Activity Other (Comment) (WNL)  Appearance/Hygiene In scrubs  Behavior Characteristics Cooperative  Mood Sad;Pleasant  Thought Process  Coherency WDL  Content WDL  Delusions None reported or observed  Perception WDL  Hallucination None reported or observed  Judgment Impaired  Confusion None  Danger to Self  Current suicidal ideation? Passive  Agreement Not to Harm Self Yes  Description of Agreement verbal  Danger to Others  Danger to Others None reported or observed

## 2023-08-10 NOTE — Progress Notes (Signed)
   08/09/23 2300  Psych Admission Type (Psych Patients Only)  Admission Status Voluntary  Psychosocial Assessment  Patient Complaints Depression;Hopelessness  Eye Contact Fair  Facial Expression Sad  Affect Sad  Speech Logical/coherent  Interaction Assertive  Motor Activity  (WNL)  Appearance/Hygiene Unremarkable  Behavior Characteristics Cooperative;Appropriate to situation  Mood Sad;Pleasant  Thought Process  Coherency WDL  Content WDL  Delusions None reported or observed  Perception WDL  Hallucination None reported or observed  Judgment Impaired  Confusion None  Danger to Self  Current suicidal ideation? Passive  Agreement Not to Harm Self Yes  Description of Agreement Verbal  Danger to Others  Danger to Others None reported or observed

## 2023-08-10 NOTE — Progress Notes (Signed)
Patient denies HI and AVH. When asked about SI, patient states, "I'm too sleepy to be able to hurt myself." Patient states that he is extremely drowsy this morning and believes the medication last night was too strong. Patient denies pain and other physical problems. Support and encouragement provided.

## 2023-08-10 NOTE — Progress Notes (Signed)
   08/10/23 0554  15 Minute Checks  Location Bedroom  Visual Appearance Calm  Behavior Sleeping  Sleep (Behavioral Health Patients Only)  Calculate sleep? (Click Yes once per 24 hr at 0600 safety check) Yes  Documented sleep last 24 hours 9.25

## 2023-08-10 NOTE — Group Note (Signed)
Date:  08/10/2023 Time:  8:38 PM  Group Topic/Focus:  Goals Group:   The focus of this group is to help patients establish daily goals to achieve during treatment and discuss how the patient can incorporate goal setting into their daily lives to aide in recovery. Self Esteem Action Plan:   The focus of this group is to help patients create a plan to continue to build self-esteem after discharge.    Participation Level:  Active  Participation Quality:  Appropriate  Affect:  Appropriate  Cognitive:  Appropriate  Insight: Appropriate  Engagement in Group:  Engaged  Modes of Intervention:  Discussion  Additional Comments:    Robert Chan 08/10/2023, 8:38 PM

## 2023-08-10 NOTE — Group Note (Signed)
[facility] LCSW Group Therapy Note   Group Date: 08/10/2023 Start Time: 1330 End Time: 1415   Type of Therapy/Topic:  Group Therapy:  Emotion Regulation  Participation Level:  Minimal   Mood:  Description of Group:    The purpose of this group is to assist patients in learning to regulate negative emotions and experience positive emotions. Patients will be guided to discuss ways in which they have been vulnerable to their negative emotions. These vulnerabilities will be juxtaposed with experiences of positive emotions or situations, and patients challenged to use positive emotions to combat negative ones. Special emphasis will be placed on coping with negative emotions in conflict situations, and patients will process healthy conflict resolution skills.  Therapeutic Goals: Patient will identify two positive emotions or experiences to reflect on in order to balance out negative emotions:  Patient will label two or more emotions that they find the most difficult to experience:  Patient will be able to demonstrate positive conflict resolution skills through discussion or role plays:   Summary of Patient Progress: Patient was present in group.  Patient participated minimally in group. Patient appeared to fall asleep at times.  Patient did engage in discussion on what emotions were felt prior to admission. Patient reported "wanted to be around other people".   Therapeutic Modalities:   Cognitive Behavioral Therapy Feelings Identification Dialectical Behavioral Therapy   Harden Mo, LCSW
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
## 2023-08-10 NOTE — Group Note (Signed)
Date:  08/10/2023 Time:  10:20 AM  Group Topic/Focus:  Managing Feelings:   The focus of this group is to identify what feelings patients have difficulty handling and develop a plan to handle them in a healthier way upon discharge.    Participation Level:  Did Not Attend    [name] 08/10/2023, 10:20 AM

## 2023-08-10 NOTE — Group Note (Signed)
Recreation Therapy Group Note   Group Topic:General Recreation  Group Date: 08/10/2023 Start Time: 1410 End Time: 1455 Facilitators: Rosina Lowenstein, LRT, CTRS Location: Courtyard  Group Description: Outdoor Recreation. Patients had the option to play the card game rummy, ring toss, and listen to music while outside in the courtyard getting fresh air and sunlight. LRT and pts discussed things that they enjoy doing in their free time outside of the hospital.   Goal Area(s) Addressed: Patient will identify leisure interests.  Patient will practice healthy decision making. Patient will engage in recreation activity.   Affect/Mood: Appropriate   Participation Level: Active and Engaged   Participation Quality: Independent   Behavior: Appropriate, Calm, and Cooperative   Speech/Thought Process: Coherent   Insight: Good   Judgement: Good   Modes of Intervention: Cooperative Play   Patient Response to Interventions:  Attentive, Engaged, and Receptive   Education Outcome:  Acknowledges education   Clinical Observations/Individualized Feedback: Jayvion was active in their participation of session activities and group discussion. Pt won a Astronomer. Pt interacted well with LRT and peers duration of session.   Plan: Continue to engage patient in RT group sessions 2-3x/week.   Rosina Lowenstein, LRT, CTRS 08/10/2023 3:10 PM
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
## 2023-08-10 NOTE — BH IP Treatment Plan (Signed)
Interdisciplinary Treatment and Diagnostic Plan Update  08/10/2023 Time of Session: 10:45AM [facility] MRN: [id number]  Principal Diagnosis: MDD (major depressive disorder), recurrent severe, without psychosis (HCC)  Secondary Diagnoses: Principal Problem:   MDD (major depressive disorder), recurrent severe, without psychosis (HCC)   Current Medications:  Current Facility-Administered Medications  Medication Dose Route Frequency Provider Last Rate Last Admin   acetaminophen (TYLENOL) tablet 650 mg  650 mg Oral Q6H PRN Motley-Mangrum, Jadeka A, PMHNP       albuterol (VENTOLIN HFA) 108 (90 Base) MCG/ACT inhaler 2 puff  2 puff Inhalation Q6H PRN Motley-Mangrum, Jadeka A, PMHNP       alum & mag hydroxide-simeth (MAALOX/MYLANTA) 200-200-20 MG/5ML suspension 30 mL  30 mL Oral Q4H PRN Motley-Mangrum, Jadeka A, PMHNP       ARIPiprazole (ABILIFY) tablet 10 mg  10 mg Oral Daily Motley-Mangrum, Jadeka A, PMHNP   10 mg at 08/10/23 0951   atorvastatin (LIPITOR) tablet 80 mg  80 mg Oral Daily [name], DO   80 mg at 08/10/23 0951   diltiazem (CARDIZEM CD) 24 hr capsule 180 mg  180 mg Oral Daily [name], DO   180 mg at 08/10/23 0951   diphenhydrAMINE (BENADRYL) capsule 50 mg  50 mg Oral TID PRN Motley-Mangrum, Geralynn Ochs A, PMHNP       Or   diphenhydrAMINE (BENADRYL) injection 50 mg  50 mg Intramuscular TID PRN Motley-Mangrum, Jadeka A, PMHNP       doxepin (SINEQUAN) capsule 50 mg  50 mg Oral QHS [name], DO   50 mg at 08/09/23 2119   haloperidol (HALDOL) tablet 5 mg  5 mg Oral TID PRN Motley-Mangrum, Jadeka A, PMHNP       Or   haloperidol lactate (HALDOL) injection 5 mg  5 mg Intramuscular TID PRN Motley-Mangrum, Geralynn Ochs A, PMHNP       hydrOXYzine (ATARAX) tablet 50 mg  50 mg Oral TID PRN [name], DO   50 mg at 08/09/23 2120   latanoprost (XALATAN) 0.005 % ophthalmic solution 1 drop  1 drop Both Eyes QHS [name], DO   1 drop  at 08/09/23 2123   magnesium hydroxide (MILK OF MAGNESIA) suspension 30 mL  30 mL Oral Daily PRN Motley-Mangrum, Jadeka A, PMHNP       mometasone-formoterol (DULERA) 200-5 MCG/ACT inhaler 2 puff  2 puff Inhalation BID Motley-Mangrum, Jadeka A, PMHNP   2 puff at 08/10/23 0952   traZODone (DESYREL) tablet 50 mg  50 mg Oral QHS PRN [name], DO       PTA Medications: Medications Prior to Admission  Medication Sig Dispense Refill Last Dose   albuterol (VENTOLIN HFA) 108 (90 Base) MCG/ACT inhaler Inhale 2 puffs into the lungs every 6 (six) hours as needed for shortness of breath. 18 g 1    mometasone-formoterol (DULERA) 200-5 MCG/ACT AERO Inhale 2 puffs into the lungs 2 (two) times daily. 1 each 1     Patient Stressors:    Patient Strengths:    Treatment Modalities: Medication Management, Group therapy, Case management,  1 to 1 session with clinician, Psychoeducation, Recreational therapy.   Physician Treatment Plan for Primary Diagnosis: MDD (major depressive disorder), recurrent severe, without psychosis (HCC) Long Term Goal(s): Improvement in symptoms so as ready for discharge   Short Term Goals: Ability to identify changes in lifestyle to reduce recurrence of condition will improve Ability to verbalize feelings will improve Ability to disclose and discuss suicidal ideas Ability  to demonstrate self-control will improve Ability to identify and develop effective coping behaviors will improve Ability to maintain clinical measurements within normal limits will improve Compliance with prescribed medications will improve Ability to identify triggers associated with substance abuse/mental health issues will improve  Medication Management: Evaluate patient's response, side effects, and tolerance of medication regimen.  Therapeutic Interventions: 1 to 1 sessions, Unit Group sessions and Medication administration.  Evaluation of Outcomes: Progressing  Physician Treatment Plan  for Secondary Diagnosis: Principal Problem:   MDD (major depressive disorder), recurrent severe, without psychosis (HCC)  Long Term Goal(s): Improvement in symptoms so as ready for discharge   Short Term Goals: Ability to identify changes in lifestyle to reduce recurrence of condition will improve Ability to verbalize feelings will improve Ability to disclose and discuss suicidal ideas Ability to demonstrate self-control will improve Ability to identify and develop effective coping behaviors will improve Ability to maintain clinical measurements within normal limits will improve Compliance with prescribed medications will improve Ability to identify triggers associated with substance abuse/mental health issues will improve     Medication Management: Evaluate patient's response, side effects, and tolerance of medication regimen.  Therapeutic Interventions: 1 to 1 sessions, Unit Group sessions and Medication administration.  Evaluation of Outcomes: Progressing   RN Treatment Plan for Primary Diagnosis: MDD (major depressive disorder), recurrent severe, without psychosis (HCC) Long Term Goal(s): Knowledge of disease and therapeutic regimen to maintain health will improve  Short Term Goals: Ability to demonstrate self-control, Ability to participate in decision making will improve, Ability to verbalize feelings will improve, Ability to disclose and discuss suicidal ideas, Ability to identify and develop effective coping behaviors will improve, and Compliance with prescribed medications will improve  Medication Management: RN will administer medications as ordered by provider, will assess and evaluate patient's response and provide education to patient for prescribed medication. RN will report any adverse and/or side effects to prescribing provider.  Therapeutic Interventions: 1 on 1 counseling sessions, Psychoeducation, Medication administration, Evaluate responses to treatment, Monitor vital  signs and CBGs as ordered, Perform/monitor CIWA, COWS, AIMS and Fall Risk screenings as ordered, Perform wound care treatments as ordered.  Evaluation of Outcomes: Progressing   LCSW Treatment Plan for Primary Diagnosis: MDD (major depressive disorder), recurrent severe, without psychosis (HCC) Long Term Goal(s): Safe transition to appropriate next level of care at discharge, Engage patient in therapeutic group addressing interpersonal concerns.  Short Term Goals: Engage patient in aftercare planning with referrals and resources, Increase social support, Increase ability to appropriately verbalize feelings, Increase emotional regulation, Facilitate acceptance of mental health diagnosis and concerns, Facilitate patient progression through stages of change regarding substance use diagnoses and concerns, Identify triggers associated with mental health/substance abuse issues, and Increase skills for wellness and recovery  Therapeutic Interventions: Assess for all discharge needs, 1 to 1 time with Social worker, Explore available resources and support systems, Assess for adequacy in community support network, Educate family and significant other(s) on suicide prevention, Complete Psychosocial Assessment, Interpersonal group therapy.  Evaluation of Outcomes: Progressing   Progress in Treatment: Attending groups: Yes. Participating in groups: Yes. Taking medication as prescribed: Yes. Toleration medication: Yes. Family/Significant other contact made: No, will contact:  once permission has been given. Patient understands diagnosis: Yes. Discussing patient identified problems/goals with staff: Yes. Medical problems stabilized or resolved: Yes. Denies suicidal/homicidal ideation: Yes. Issues/concerns per patient self-inventory: No. Other: none  New problem(s) identified: No, Describe:  none  New Short Term/Long Term Goal(s): detox, elimination of symptoms of psychosis,  medication management for  mood stabilization; elimination of SI thoughts; development of comprehensive mental wellness/sobriety plan.   Patient Goals:  "try to get the right medicine to keep me going and get better"  Discharge Plan or Barriers: Patient reports that he would like to join a day program or support group in the local area.  He reports plans to return to his home.  CSW team to assist pt in development of appropriate discharge plans.    Reason for Continuation of Hospitalization: Anxiety Depression Medication stabilization Suicidal ideation Withdrawal symptoms  Estimated Length of Stay:  1-7 days  Last 3 [place] Suicide Severity Risk Score: Flowsheet Row Admission (Current) from 08/08/2023 in [facility] BEHAVIORAL MEDICINE Most recent reading at 08/09/2023 12:00 AM ED from 08/08/2023 in [facility] Emergency Department at [facility] Most recent reading at 08/08/2023 12:06 PM ED from 07/15/2023 in [facility] Emergency Department at [facility] Most recent reading at 07/16/2023 12:04 AM  C-SSRS RISK CATEGORY High Risk High Risk Moderate Risk       Last PHQ 2/9 Scores:    10/19/2022    2:53 PM 05/11/2022    2:30 PM 03/31/2022    2:15 PM  Depression screen PHQ 2/9  Decreased Interest 0 1 0  Down, Depressed, Hopeless 0 1 0  PHQ - 2 Score 0 2 0  Altered sleeping  0   Tired, decreased energy  1   Change in appetite  1   Trouble concentrating  1   Moving slowly or fidgety/restless  0   Suicidal thoughts  0   PHQ-9 Score  5     Scribe for Treatment Team: Harden Mo, Alexander Mt 08/10/2023 12:33 PM

## 2023-08-11 DIAGNOSIS — F332 Major depressive disorder, recurrent severe without psychotic features: Secondary | ICD-10-CM | POA: Diagnosis not present

## 2023-08-11 NOTE — Progress Notes (Signed)
Patient is a voluntary admission to [facility] for MDD, hx SAD and polysubstance. He endorses passive SI d/t his loneliness living at home by himself in his apartment.  Today he was mostly in the dayroom watching tv, and participating in group. Was calm, cooperative but a little withdrawn from staff and peers. Denies HI, AVH, Anxiety. Will continue to monitor.

## 2023-08-11 NOTE — Progress Notes (Signed)
[facility] MD Progress Note  08/11/2023 1:33 PM Robert Chan  MRN:  [id number] Subjective: [facility] is seen on rounds.  I talked to him about rehab and he would rather do a psychiatric day treatment program.  He has been compliant with medications and no side effects.  Nurses report no issues. Principal Problem: MDD (major depressive disorder), recurrent severe, without psychosis (HCC) Diagnosis: Principal Problem:   MDD (major depressive disorder), recurrent severe, without psychosis (HCC)  Total Time spent with patient: 15 minutes  Past Psychiatric History: Depression and polysubstance abuse  Past Medical History:  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   Depression    Elevated serum creatinine 08/28/2019   Homelessness 06/19/2020   Homicidal ideations 05/25/2023   Hypertension    Major depressive disorder, recurrent episode, 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/11/2022   Major depressive disorder, single episode, 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/10/2022   MDD (major depressive disorder), recurrent severe, 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   NSTEMI (non-ST elevated myocardial infarction) (HCC)    Schizoaffective disorder (HCC) 11/08/2022   Status post incision and drainage 04/22/2021   Suicidal ideation 06/05/2020   Suicidal ideation 06/05/2020    Past Surgical History:  Procedure Laterality Date   APPENDECTOMY     EYE SURGERY     LEFT HEART CATH AND CORONARY ANGIOGRAPHY N/A 06/05/2020   Procedure: LEFT HEART CATH AND CORONARY ANGIOGRAPHY;  Surgeon: Elder Negus, MD;  Location: [facility] INVASIVE CV LAB;  Service: Cardiovascular;  Laterality: N/A;   PROSTATE SURGERY     Family History:  Family History  Problem Relation Age of Onset    Hypertension Mother    Diabetes Mother    Hypertension Father    Hypertension Sister    Family Psychiatric  History: Unremarkable Social History:  Social History   Substance and Sexual Activity  Alcohol Use Yes   Comment: 4 40oz beer a week     Social History   Substance and Sexual Activity  Drug Use Not Currently   Types: Cocaine   Comment: cocaine use once a month, last use 6 mos ago    Social History   Socioeconomic History   Marital status: Single    Spouse name: Not on file   Number of children: Not on file   Years of education: Not on file   Highest education level: Not on file  Occupational History   Not on file  Tobacco Use   Smoking status: Some Days    Current packs/day: 0.00    Types: Cigarettes    Last attempt to quit: 12/25/2020    Years since quitting: 2.6   Smokeless tobacco: Never   Tobacco comments:    Smokes a couple cigarettes every now and then  Vaping Use   Vaping status: Never Used  Substance and Sexual Activity   Alcohol use: Yes    Comment: 4 40oz beer a week   Drug use: Not Currently    Types: Cocaine    Comment: cocaine use once a month, last use 6 mos ago   Sexual activity: Not Currently  Other Topics Concern   Not on file  Social History Narrative   His mother passed at age 37 (  when he was 30 years old) and his father raised him and his siblings   His parents were pastors as a lot of his sisters and other family members are today   There was a total of 13 children in his family Had 82 sisters, one sister deceased , Had 3 brothers, all brothers deceased   He is the youngest of the 71 and was "always in trouble in my younger years"   Family still live in [place], [place], some in [place], [place]   Has a Daughter and son with a total of 10 grandchildren (6 grand daughters local and 4 grandsons)    Values family   Married twice, present wife incarcerated   Social Determinants of Health   Financial Resource Strain: Low Risk   (10/19/2022)   Overall Financial Resource Strain (CARDIA)    Difficulty of Paying Living Expenses: Not hard at all  Food Insecurity: No Food Insecurity (08/09/2023)   Hunger Vital Sign    Worried About Running Out of Food in the Last Year: Never true    Ran Out of Food in the Last Year: Never true  Transportation Needs: Unmet Transportation Needs (08/09/2023)   PRAPARE - Administrator, Civil Service (Medical): Yes    Lack of Transportation (Non-Medical): Yes  Physical Activity: Inactive (10/19/2022)   Exercise Vital Sign    Days of Exercise per Week: 0 days    Minutes of Exercise per Session: 0 min  Stress: No Stress Concern Present (10/19/2022)   [name] of Occupational Health - Occupational Stress Questionnaire    Feeling of Stress : Not at all  Social Connections: Moderately Integrated (03/31/2022)   Social Connection and Isolation Panel [NHANES]    Frequency of Communication with Friends and Family: Three times a week    Frequency of Social Gatherings with Friends and Family: Three times a week    Attends Religious Services: 1 to 4 times per year    Active Member of Clubs or Organizations: Yes    Attends Banker Meetings: 1 to 4 times per year    Marital Status: Never married   Additional Social History:                         Sleep: Good  Appetite:  Good  Current Medications: Current Facility-Administered Medications  Medication Dose Route Frequency Provider Last Rate Last Admin   acetaminophen (TYLENOL) tablet 650 mg  650 mg Oral Q6H PRN Motley-Mangrum, Jadeka A, PMHNP       albuterol (VENTOLIN HFA) 108 (90 Base) MCG/ACT inhaler 2 puff  2 puff Inhalation Q6H PRN Motley-Mangrum, Jadeka A, PMHNP       alum & mag hydroxide-simeth (MAALOX/MYLANTA) 200-200-20 MG/5ML suspension 30 mL  30 mL Oral Q4H PRN Motley-Mangrum, Jadeka A, PMHNP       ARIPiprazole (ABILIFY) tablet 10 mg  10 mg Oral Daily Motley-Mangrum, Jadeka A, PMHNP   10 mg  at 08/11/23 1009   atorvastatin (LIPITOR) tablet 80 mg  80 mg Oral Daily [name], DO   80 mg at 08/11/23 1008   diltiazem (CARDIZEM CD) 24 hr capsule 180 mg  180 mg Oral Daily [name], DO   180 mg at 08/11/23 1009   diphenhydrAMINE (BENADRYL) capsule 50 mg  50 mg Oral TID PRN Motley-Mangrum, Jadeka A, PMHNP       Or   diphenhydrAMINE (BENADRYL) injection 50 mg  50 mg Intramuscular  TID PRN Motley-Mangrum, Geralynn Ochs A, PMHNP       doxepin (SINEQUAN) capsule 50 mg  50 mg Oral QHS [name], DO   50 mg at 08/10/23 2114   haloperidol (HALDOL) tablet 5 mg  5 mg Oral TID PRN Motley-Mangrum, Geralynn Ochs A, PMHNP       Or   haloperidol lactate (HALDOL) injection 5 mg  5 mg Intramuscular TID PRN Motley-Mangrum, Jadeka A, PMHNP       hydrOXYzine (ATARAX) tablet 50 mg  50 mg Oral TID PRN [name], DO   50 mg at 08/10/23 2115   latanoprost (XALATAN) 0.005 % ophthalmic solution 1 drop  1 drop Both Eyes QHS [name], DO   1 drop at 08/10/23 2115   magnesium hydroxide (MILK OF MAGNESIA) suspension 30 mL  30 mL Oral Daily PRN Motley-Mangrum, Jadeka A, PMHNP       mometasone-formoterol (DULERA) 200-5 MCG/ACT inhaler 2 puff  2 puff Inhalation BID Motley-Mangrum, Jadeka A, PMHNP   2 puff at 08/11/23 1008   polyethylene glycol (MIRALAX / GLYCOLAX) packet 17 g  17 g Oral Daily [name], DO   17 g at 08/11/23 1011   traZODone (DESYREL) tablet 50 mg  50 mg Oral QHS PRN [name], DO   50 mg at 08/10/23 2114    Lab Results: No results found for this or any previous visit (from the past 48 hour(s)).  Blood Alcohol level:  Lab Results  Component Value Date   ETH <10 08/08/2023   ETH <10 07/15/2023    Metabolic Disorder Labs: Lab Results  Component Value Date   HGBA1C 5.2 01/13/2023   MPG 103 01/13/2023   MPG 91.06 09/07/2020   No results found for: "PROLACTIN" Lab Results  Component Value Date   CHOL 168  01/13/2023   TRIG 62 01/13/2023   HDL 57 01/13/2023   CHOLHDL 2.9 01/13/2023   VLDL 12 01/13/2023   LDLCALC 99 01/13/2023   LDLCALC 104 (H) 05/17/2022    Physical Findings: AIMS:  , ,  ,  ,    CIWA:    COWS:     Musculoskeletal: Strength & Muscle Tone: within normal limits Gait & Station: normal Patient leans: N/A  Psychiatric Specialty Exam:  Presentation  General Appearance:  Appropriate for Environment  Eye Contact: Good  Speech: Clear and Coherent  Speech Volume: Normal  Handedness: Right   Mood and Affect  Mood: Hopeless; Depressed  Affect: Appropriate   Thought Process  Thought Processes: Coherent  Descriptions of Associations:Intact  Orientation:Full (Time, Place and Person)  Thought Content:WDL  History of Schizophrenia/Schizoaffective disorder:Yes  Duration of Psychotic Symptoms:Greater than six months  Hallucinations:No data recorded Ideas of Reference:None  Suicidal Thoughts:No data recorded Homicidal Thoughts:No data recorded  Sensorium  Memory: Recent Fair; Immediate Good  Judgment: Poor  Insight: Fair   Chartered certified accountant: Fair  Attention Span: Fair  Recall: [employer] of Knowledge: Fair  Language: Fair   Psychomotor Activity  Psychomotor Activity:No data recorded  Assets  Assets: Manufacturing systems engineer; Desire for Improvement; Housing; Social Support   Sleep  Sleep:No data recorded    Blood pressure 136/76, pulse 71, temperature (!) 97.5 F (36.4 C), resp. rate 18, height 6\' 4"  (1.93 m), weight 97.8 kg, SpO2 99%. Body mass index is 26.23 kg/m.   Treatment Plan Summary: Daily contact with patient to assess and evaluate symptoms and progress in treatment, Medication management, and Plan continue current medication.  [name], DO 08/11/2023, 1:33 PM

## 2023-08-11 NOTE — Progress Notes (Signed)
   08/11/23 0600  15 Minute Checks  Location Bedroom  Visual Appearance Calm  Behavior Sleeping  Sleep (Behavioral Health Patients Only)  Calculate sleep? (Click Yes once per 24 hr at 0600 safety check) Yes  Documented sleep last 24 hours 10

## 2023-08-11 NOTE — Group Note (Signed)
Recreation Therapy Group Note   Group Topic:Leisure Education  Group Date: 08/11/2023 Start Time: 1400 End Time: 1500 Facilitators: Rosina Lowenstein, LRT, CTRS Location:  Day Room  Group Description: Bingo. LRT and patients played multiple games of Bingo with music playing in the background. LRT and pts discussed how this could be a leisure interest and the importance of doing things they enjoy post-discharge.   Goal Area(s) Addressed: Patient will identify leisure interests.  Patient will practice healthy decision making. Patient will engage in recreation activity.    Affect/Mood: Appropriate   Participation Level: Active and Engaged   Participation Quality: Independent   Behavior: Appropriate, Calm, and Cooperative   Speech/Thought Process: Coherent   Insight: Good   Judgement: Good   Modes of Intervention: Cooperative Play   Patient Response to Interventions:  Attentive, Engaged, Interested , and Receptive   Education Outcome:  Acknowledges education   Clinical Observations/Individualized Feedback: [facility] was active in their participation of session activities and group discussion. Pt won games of bingo and chose a stress ball as a prize. Pt interacted well with LRT and peers duration of session.   Plan: Continue to engage patient in RT group sessions 2-3x/week.   Rosina Lowenstein, LRT, CTRS 08/11/2023 3:08 PM

## 2023-08-12 DIAGNOSIS — F332 Major depressive disorder, recurrent severe without psychotic features: Secondary | ICD-10-CM | POA: Diagnosis not present

## 2023-08-12 MED ORDER — GUAIFENESIN ER 600 MG PO TB12
600.0000 mg | ORAL_TABLET | Freq: Four times a day (QID) | ORAL | Status: DC | PRN
  Administered 2023-08-12 – 2023-08-13 (×2): 600 mg via ORAL
  Filled 2023-08-12 (×2): qty 1

## 2023-08-12 NOTE — Group Note (Signed)
Date:  08/12/2023 Time:  9:23 PM  Group Topic/Focus:  Making Healthy Choices:   The focus of this group is to help patients identify negative/unhealthy choices they were using prior to admission and identify positive/healthier coping strategies to replace them upon discharge.    Participation Level:  Active  Participation Quality:  Appropriate  Affect:  Appropriate  Cognitive:  Appropriate  Insight: Appropriate  Engagement in Group:  Engaged  Modes of Intervention:  Education  Additional Comments:    Garry Heater 08/12/2023, 9:23 PM

## 2023-08-12 NOTE — Progress Notes (Signed)
[facility] MD Progress Note  08/12/2023 3:15 PM Robert Chan  MRN:  [id number] Subjective: Robert Chan is seen on rounds.  He has no complaints.  His mood and affect have improved since restarting medication and being off drugs and alcohol.  He has no complaints.  No side effects. Principal Problem: MDD (major depressive disorder), recurrent severe, without psychosis (HCC) Diagnosis: Principal Problem:   MDD (major depressive disorder), recurrent severe, without psychosis (HCC)  Total Time spent with patient: 15 minutes  Past Psychiatric History: Polysubstance abuse and depression  Past Medical History:  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   Depression    Elevated serum creatinine 08/28/2019   Homelessness 06/19/2020   Homicidal ideations 05/25/2023   Hypertension    Major depressive disorder, recurrent episode, 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/11/2022   Major depressive disorder, single episode, 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/10/2022   MDD (major depressive disorder), recurrent severe, 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   NSTEMI (non-ST elevated myocardial infarction) (HCC)    Schizoaffective disorder (HCC) 11/08/2022   Status post incision and drainage 04/22/2021   Suicidal ideation 06/05/2020   Suicidal ideation 06/05/2020    Past Surgical History:  Procedure Laterality Date   APPENDECTOMY     EYE SURGERY     LEFT HEART CATH AND CORONARY ANGIOGRAPHY N/A 06/05/2020   Procedure: LEFT HEART CATH AND CORONARY ANGIOGRAPHY;  Surgeon: Elder Negus, MD;  Location: [facility];  Service: Cardiovascular;  Laterality: N/A;   PROSTATE SURGERY     Family History:  Family History  Problem Relation Age of Onset   Hypertension  Mother    Diabetes Mother    Hypertension Father    Hypertension Sister    Family Psychiatric  History: Unremarkable Social History:  Social History   Substance and Sexual Activity  Alcohol Use Yes   Comment: 4 40oz beer a week     Social History   Substance and Sexual Activity  Drug Use Not Currently   Types: Cocaine   Comment: cocaine use once a month, last use 6 mos ago    Social History   Socioeconomic History   Marital status: Single    Spouse name: Not on file   Number of children: Not on file   Years of education: Not on file   Highest education level: Not on file  Occupational History   Not on file  Tobacco Use   Smoking status: Some Days    Current packs/day: 0.00    Types: Cigarettes    Last attempt to quit: 12/25/2020    Years since quitting: 2.6   Smokeless tobacco: Never   Tobacco comments:    Smokes a couple cigarettes every now and then  Vaping Use   Vaping status: Never Used  Substance and Sexual Activity   Alcohol use: Yes    Comment: 4 40oz beer a week   Drug use: Not Currently    Types: Cocaine    Comment: cocaine use once a month, last use 6 mos ago   Sexual activity: Not Currently  Other Topics Concern   Not on file  Social History Narrative   His mother passed at age 6 (when he was  26 years old) and his father raised him and his siblings   His parents were pastors as a lot of his sisters and other family members are today   There was a total of 13 children in his family Had 23 sisters, one sister deceased , Had 3 brothers, all brothers deceased   He is the youngest of the 26 and was "always in trouble in my younger years"   Family still live in [place], [place], some in [place], [place]   Has a Daughter and son with a total of 10 grandchildren (6 grand daughters local and 4 grandsons)    Values family   Married twice, present wife incarcerated   Social Determinants of Health   Financial Resource Strain: Low Risk  (10/19/2022)    Overall Financial Resource Strain (CARDIA)    Difficulty of Paying Living Expenses: Not hard at all  Food Insecurity: No Food Insecurity (08/09/2023)   Hunger Vital Sign    Worried About Running Out of Food in the Last Year: Never true    Ran Out of Food in the Last Year: Never true  Transportation Needs: Unmet Transportation Needs (08/09/2023)   PRAPARE - Administrator, Civil Service (Medical): Yes    Lack of Transportation (Non-Medical): Yes  Physical Activity: Inactive (10/19/2022)   Exercise Vital Sign    Days of Exercise per Week: 0 days    Minutes of Exercise per Session: 0 min  Stress: No Stress Concern Present (10/19/2022)   [name] of Occupational Health - Occupational Stress Questionnaire    Feeling of Stress : Not at all  Social Connections: Moderately Integrated (03/31/2022)   Social Connection and Isolation Panel [NHANES]    Frequency of Communication with Friends and Family: Three times a week    Frequency of Social Gatherings with Friends and Family: Three times a week    Attends Religious Services: 1 to 4 times per year    Active Member of Clubs or Organizations: Yes    Attends Banker Meetings: 1 to 4 times per year    Marital Status: Never married   Additional Social History:                         Sleep: Good  Appetite:  Good  Current Medications: Current Facility-Administered Medications  Medication Dose Route Frequency Provider Last Rate Last Admin   acetaminophen (TYLENOL) tablet 650 mg  650 mg Oral Q6H PRN Motley-Mangrum, Jadeka A, PMHNP       albuterol (VENTOLIN HFA) 108 (90 Base) MCG/ACT inhaler 2 puff  2 puff Inhalation Q6H PRN Motley-Mangrum, Jadeka A, PMHNP       alum & mag hydroxide-simeth (MAALOX/MYLANTA) 200-200-20 MG/5ML suspension 30 mL  30 mL Oral Q4H PRN Motley-Mangrum, Jadeka A, PMHNP       ARIPiprazole (ABILIFY) tablet 10 mg  10 mg Oral Daily Motley-Mangrum, Jadeka A, PMHNP   10 mg at 08/12/23 0956    atorvastatin (LIPITOR) tablet 80 mg  80 mg Oral Daily [name], DO   80 mg at 08/12/23 0956   diltiazem (CARDIZEM CD) 24 hr capsule 180 mg  180 mg Oral Daily [name], DO   180 mg at 08/12/23 0956   diphenhydrAMINE (BENADRYL) capsule 50 mg  50 mg Oral TID PRN Motley-Mangrum, Jadeka A, PMHNP       Or   diphenhydrAMINE (BENADRYL) injection 50 mg  50 mg Intramuscular TID PRN Motley-Mangrum,  Ezra Sites, PMHNP       doxepin (SINEQUAN) capsule 50 mg  50 mg Oral QHS [name], DO   50 mg at 08/11/23 2113   haloperidol (HALDOL) tablet 5 mg  5 mg Oral TID PRN Motley-Mangrum, Geralynn Ochs A, PMHNP       Or   haloperidol lactate (HALDOL) injection 5 mg  5 mg Intramuscular TID PRN Motley-Mangrum, Jadeka A, PMHNP       hydrOXYzine (ATARAX) tablet 50 mg  50 mg Oral TID PRN [name], DO   50 mg at 08/10/23 2115   latanoprost (XALATAN) 0.005 % ophthalmic solution 1 drop  1 drop Both Eyes QHS [name], DO   1 drop at 08/11/23 2114   magnesium hydroxide (MILK OF MAGNESIA) suspension 30 mL  30 mL Oral Daily PRN Motley-Mangrum, Jadeka A, PMHNP       mometasone-formoterol (DULERA) 200-5 MCG/ACT inhaler 2 puff  2 puff Inhalation BID Motley-Mangrum, Jadeka A, PMHNP   2 puff at 08/12/23 0957   polyethylene glycol (MIRALAX / GLYCOLAX) packet 17 g  17 g Oral Daily [name], DO   17 g at 08/12/23 0956   traZODone (DESYREL) tablet 50 mg  50 mg Oral QHS PRN [name], DO   50 mg at 08/11/23 2158    Lab Results: No results found for this or any previous visit (from the past 48 hour(s)).  Blood Alcohol level:  Lab Results  Component Value Date   ETH <10 08/08/2023   ETH <10 07/15/2023    Metabolic Disorder Labs: Lab Results  Component Value Date   HGBA1C 5.2 01/13/2023   MPG 103 01/13/2023   MPG 91.06 09/07/2020   No results found for: "PROLACTIN" Lab Results  Component Value Date   CHOL 168 01/13/2023   TRIG 62  01/13/2023   HDL 57 01/13/2023   CHOLHDL 2.9 01/13/2023   VLDL 12 01/13/2023   LDLCALC 99 01/13/2023   LDLCALC 104 (H) 05/17/2022    Physical Findings: AIMS:  , ,  ,  ,    CIWA:    COWS:     Musculoskeletal: Strength & Muscle Tone: within normal limits Gait & Station: normal Patient leans: N/A  Psychiatric Specialty Exam:  Presentation  General Appearance:  Appropriate for Environment  Eye Contact: Good  Speech: Clear and Coherent  Speech Volume: Normal  Handedness: Right   Mood and Affect  Mood: Hopeless; Depressed  Affect: Appropriate   Thought Process  Thought Processes: Coherent  Descriptions of Associations:Intact  Orientation:Full (Time, Place and Person)  Thought Content:WDL  History of Schizophrenia/Schizoaffective disorder:Yes  Duration of Psychotic Symptoms:Greater than six months  Hallucinations:No data recorded Ideas of Reference:None  Suicidal Thoughts:No data recorded Homicidal Thoughts:No data recorded  Sensorium  Memory: Recent Fair; Immediate Good  Judgment: Poor  Insight: Fair   Chartered certified accountant: Fair  Attention Span: Fair  Recall: [employer] of Knowledge: Fair  Language: Fair   Psychomotor Activity  Psychomotor Activity:No data recorded  Assets  Assets: Manufacturing systems engineer; Desire for Improvement; Housing; Social Support   Sleep  Sleep:No data recorded    Blood pressure 129/85, pulse 91, temperature 97.7 F (36.5 C), resp. rate 16, height 6\' 4"  (1.93 m), weight 97.8 kg, SpO2 100%. Body mass index is 26.23 kg/m.   Treatment Plan Summary: Daily contact with patient to assess and evaluate symptoms and progress in treatment, Medication management, and Plan continue current medication.  Izzabella Besse Tresea Mall, DO  08/12/2023, 3:15 PM

## 2023-08-12 NOTE — Plan of Care (Signed)
 D: Pt alert and oriented. Pt denies experiencing any anxiety/depression at this time. Pt denies experiencing any pain at this time. Pt denies experiencing any SI/HI, or AVH at this time.   A: Scheduled medications administered to pt, per MD orders. Support and encouragement provided. Frequent verbal contact made. Routine safety checks conducted q15 minutes.   R: No adverse drug reactions noted. Pt verbally contracts for safety at this time. Pt compliant with medications and treatment plan. Pt interacts well with others on the unit. Pt remains safe at this time. Plan of care ongoing.  Problem: Nutrition: Goal: Adequate nutrition will be maintained Outcome: Progressing   Problem: Coping: Goal: Level of anxiety will decrease Outcome: Progressing

## 2023-08-12 NOTE — Plan of Care (Signed)

## 2023-08-12 NOTE — Group Note (Signed)
Recreation Therapy Group Note   Group Topic:Other  Group Date: 08/12/2023 Start Time: 1400 End Time: 1450 Facilitators: Rosina Lowenstein, LRT, CTRS Location: Courtyard  Group Description: Outdoor Recreation. Patients had the option to play corn hole, ring toss, bowling or listening to music while outside in the courtyard getting fresh air and sunlight. Popular dance hit songs, like the Cha Cha Slide, were then played and patients were encouraged to join in. LRT and patients discussed things that they enjoy doing in their free time outside of the hospital. LRT and techs encouraged patients to drink water after being active and getting their heart rate up.   Goal Area(s) Addressed: Patient will identify leisure interests.  Patient will practice healthy decision making. Patient will engage in recreation activity.   Affect/Mood: Appropriate   Participation Level: Active and Engaged   Participation Quality: Independent   Behavior: Calm and Cooperative   Speech/Thought Process: Coherent   Insight: Good   Judgement: Good   Modes of Intervention: Activity   Patient Response to Interventions:  Attentive, Engaged, Interested , and Receptive   Education Outcome:  Acknowledges education   Clinical Observations/Individualized Feedback: Darryel was active in their participation of session activities and group discussion. Pt interacted well with LRT and peers duration of session.   Plan: Continue to engage patient in RT group sessions 2-3x/week.   Rosina Lowenstein, LRT, CTRS 08/12/2023 3:11 PM

## 2023-08-12 NOTE — BHH Counselor (Signed)
CSW contacted Onyx Schirmer at [phone] to complete SPE, operator states call cannot be completed at this time, CSW unable to leave VM.   CSW contacted Kevontay Burks at [phone] to complete SPE, operator states call cannot be completed at this time, CSW unable to leave VM.   Reynaldo Minium, MSW, [place] 08/12/2023 1:03 PM

## 2023-08-12 NOTE — Progress Notes (Signed)
D- Patient alert and oriented. Flat affect. Guarded. Visible in the dayroom. Watching tv.  Rates depression 8/10. Reports feeling sad and hopeless. Passive SI w/o plan. Denies HI, AVHl. A- Scheduled medications administered to patient, per MD orders. PRN given for insomnia. Tol well. Support and encouragement provided.  Routine safety checks conducted every 15 minutes.  Patient informed to notify staff with problems or concerns. R- No adverse drug reactions noted. Patient contracts for safety at this time. Patient compliant with medications and treatment plan. Patient receptive, calm, and cooperative. Attends group. Limited interaction with peers and staff.  Patient remains safe at this time.

## 2023-08-12 NOTE — Group Note (Signed)
Date:  08/12/2023 Time:  8:44 PM  Group Topic/Focus:  Personal Choices and Values:   The focus of this group is to help patients assess and explore the importance of values in their lives, how their values affect their decisions, how they express their values and what opposes their expression.    Participation Level:  Active  Participation Quality:  Appropriate  Affect:  Appropriate  Cognitive:  Appropriate  Insight: Good  Engagement in Group:  Engaged  Modes of Intervention:  Discussion  Additional Comments:    Burt Ek 08/12/2023, 8:44 PM

## 2023-08-12 NOTE — Group Note (Signed)
Date:  08/12/2023 Time:  11:08 AM  Group Topic/Focus:  Recovery Goals:   The focus of this group is to identify appropriate goals for recovery and establish a plan to achieve them.    Participation Level:  Active  Participation Quality:  Appropriate  Affect:  Appropriate  Cognitive:  Alert  Insight: Appropriate  Engagement in Group:  Engaged  Modes of Intervention:  Education  Additional Comments:    [name] 08/12/2023, 11:08 AM

## 2023-08-13 DIAGNOSIS — F332 Major depressive disorder, recurrent severe without psychotic features: Secondary | ICD-10-CM | POA: Diagnosis not present

## 2023-08-13 LAB — RESPIRATORY PANEL BY PCR

## 2023-08-13 NOTE — Progress Notes (Signed)
Pt received in bed with eyes open. RR even and unlabored. C/o lightheadedness and dizziness. [id number]. States "I'm not feeling well. I have a headache that starts from the front of my head to the back". Reports non-productive cough, runny nose and chills. Tylenol 650 mg po given as ordered PRN for pain/discomfort. Rashaun, NP notified. N.O. Mucinex 600 mg tab po q 6 hours PRN for cough. Denies sore throat. No s/s of resp distress noted. Plan of care continued.

## 2023-08-13 NOTE — Plan of Care (Signed)

## 2023-08-13 NOTE — Progress Notes (Signed)
[facility] MD Progress Note  08/13/2023 1:05 PM Robert Chan  MRN:  [id number] Subjective: Robert Chan is seen on rounds.  He has no complaints.  Nurses report that he has had a fever so we will check COVID. Principal Problem: MDD (major depressive disorder), recurrent severe, without psychosis (HCC) Diagnosis: Principal Problem:   MDD (major depressive disorder), recurrent severe, without psychosis (HCC)  Total Time spent with patient: 15 minutes  Past Psychiatric History: Alcohol and depression  Past Medical History:  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   Depression    Elevated serum creatinine 08/28/2019   Homelessness 06/19/2020   Homicidal ideations 05/25/2023   Hypertension    Major depressive disorder, recurrent episode, 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/11/2022   Major depressive disorder, single episode, 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/10/2022   MDD (major depressive disorder), recurrent severe, 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   NSTEMI (non-ST elevated myocardial infarction) (HCC)    Schizoaffective disorder (HCC) 11/08/2022   Status post incision and drainage 04/22/2021   Suicidal ideation 06/05/2020   Suicidal ideation 06/05/2020    Past Surgical History:  Procedure Laterality Date   APPENDECTOMY     EYE SURGERY     LEFT HEART CATH AND CORONARY ANGIOGRAPHY N/A 06/05/2020   Procedure: LEFT HEART CATH AND CORONARY ANGIOGRAPHY;  Surgeon: Elder Negus, MD;  Location: [facility];  Service: Cardiovascular;  Laterality: N/A;   PROSTATE SURGERY     Family History:  Family History  Problem Relation Age of Onset   Hypertension Mother    Diabetes Mother    Hypertension Father    Hypertension Sister    Family  Psychiatric  History: Unremarkable Social History:  Social History   Substance and Sexual Activity  Alcohol Use Yes   Comment: 4 40oz beer a week     Social History   Substance and Sexual Activity  Drug Use Not Currently   Types: Cocaine   Comment: cocaine use once a month, last use 6 mos ago    Social History   Socioeconomic History   Marital status: Single    Spouse name: Not on file   Number of children: Not on file   Years of education: Not on file   Highest education level: Not on file  Occupational History   Not on file  Tobacco Use   Smoking status: Some Days    Current packs/day: 0.00    Types: Cigarettes    Last attempt to quit: 12/25/2020    Years since quitting: 2.6   Smokeless tobacco: Never   Tobacco comments:    Smokes a couple cigarettes every now and then  Vaping Use   Vaping status: Never Used  Substance and Sexual Activity   Alcohol use: Yes    Comment: 4 40oz beer a week   Drug use: Not Currently    Types: Cocaine    Comment: cocaine use once a month, last use 6 mos ago   Sexual activity: Not Currently  Other Topics Concern   Not on file  Social History Narrative   His mother passed at age 32 (when he was 40 years old) and his father raised him and his siblings  His parents were pastors as a lot of his sisters and other family members are today   There was a total of 13 children in his family Had 34 sisters, one sister deceased , Had 3 brothers, all brothers deceased   He is the youngest of the 23 and was "always in trouble in my younger years"   Family still live in [place], [place], some in [place], [place]   Has a Daughter and son with a total of 10 grandchildren (6 grand daughters local and 4 grandsons)    Values family   Married twice, present wife incarcerated   Social Determinants of Health   Financial Resource Strain: Low Risk  (10/19/2022)   Overall Financial Resource Strain (CARDIA)    Difficulty of Paying Living Expenses: Not  hard at all  Food Insecurity: No Food Insecurity (08/09/2023)   Hunger Vital Sign    Worried About Running Out of Food in the Last Year: Never true    Ran Out of Food in the Last Year: Never true  Transportation Needs: Unmet Transportation Needs (08/09/2023)   PRAPARE - Administrator, Civil Service (Medical): Yes    Lack of Transportation (Non-Medical): Yes  Physical Activity: Inactive (10/19/2022)   Exercise Vital Sign    Days of Exercise per Week: 0 days    Minutes of Exercise per Session: 0 min  Stress: No Stress Concern Present (10/19/2022)   Robert Chan of Occupational Health - Occupational Stress Questionnaire    Feeling of Stress : Not at all  Social Connections: Moderately Integrated (03/31/2022)   Social Connection and Isolation Panel [NHANES]    Frequency of Communication with Friends and Family: Three times a week    Frequency of Social Gatherings with Friends and Family: Three times a week    Attends Religious Services: 1 to 4 times per year    Active Member of Clubs or Organizations: Yes    Attends Banker Meetings: 1 to 4 times per year    Marital Status: Never married   Additional Social History:                         Sleep: Good  Appetite:  Good  Current Medications: Current Facility-Administered Medications  Medication Dose Route Frequency Provider Last Rate Last Admin   acetaminophen (TYLENOL) tablet 650 mg  650 mg Oral Q6H PRN Motley-Mangrum, Jadeka A, PMHNP   650 mg at 08/13/23 0737   albuterol (VENTOLIN HFA) 108 (90 Base) MCG/ACT inhaler 2 puff  2 puff Inhalation Q6H PRN Motley-Mangrum, Jadeka A, PMHNP       alum & mag hydroxide-simeth (MAALOX/MYLANTA) 200-200-20 MG/5ML suspension 30 mL  30 mL Oral Q4H PRN Motley-Mangrum, Jadeka A, PMHNP       ARIPiprazole (ABILIFY) tablet 10 mg  10 mg Oral Daily Motley-Mangrum, Jadeka A, PMHNP   10 mg at 08/13/23 1023   atorvastatin (LIPITOR) tablet 80 mg  80 mg Oral Daily [name], DO   80 mg at 08/13/23 1023   diltiazem (CARDIZEM CD) 24 hr capsule 180 mg  180 mg Oral Daily [name], DO   180 mg at 08/13/23 1023   diphenhydrAMINE (BENADRYL) capsule 50 mg  50 mg Oral TID PRN Motley-Mangrum, Jadeka A, PMHNP       Or   diphenhydrAMINE (BENADRYL) injection 50 mg  50 mg Intramuscular TID PRN Motley-Mangrum, Geralynn Ochs A, PMHNP       doxepin (  SINEQUAN) capsule 50 mg  50 mg Oral QHS [name], DO   50 mg at 08/12/23 2126   guaiFENesin (MUCINEX) 12 hr tablet 600 mg  600 mg Oral Q6H PRN Jearld Lesch, NP   600 mg at 08/13/23 0737   haloperidol (HALDOL) tablet 5 mg  5 mg Oral TID PRN Motley-Mangrum, Geralynn Ochs A, PMHNP       Or   haloperidol lactate (HALDOL) injection 5 mg  5 mg Intramuscular TID PRN Motley-Mangrum, Jadeka A, PMHNP       hydrOXYzine (ATARAX) tablet 50 mg  50 mg Oral TID PRN [name], DO   50 mg at 08/10/23 2115   latanoprost (XALATAN) 0.005 % ophthalmic solution 1 drop  1 drop Both Eyes QHS [name], DO   1 drop at 08/12/23 2125   magnesium hydroxide (MILK OF MAGNESIA) suspension 30 mL  30 mL Oral Daily PRN Motley-Mangrum, Jadeka A, PMHNP       mometasone-formoterol (DULERA) 200-5 MCG/ACT inhaler 2 puff  2 puff Inhalation BID Motley-Mangrum, Jadeka A, PMHNP   2 puff at 08/13/23 1023   polyethylene glycol (MIRALAX / GLYCOLAX) packet 17 g  17 g Oral Daily [name], DO   17 g at 08/13/23 1023   traZODone (DESYREL) tablet 50 mg  50 mg Oral QHS PRN [name], DO   50 mg at 08/11/23 2158    Lab Results: No results found for this or any previous visit (from the past 48 hour(s)).  Blood Alcohol level:  Lab Results  Component Value Date   ETH <10 08/08/2023   ETH <10 07/15/2023    Metabolic Disorder Labs: Lab Results  Component Value Date   HGBA1C 5.2 01/13/2023   MPG 103 01/13/2023   MPG 91.06 09/07/2020   No results found for: "PROLACTIN" Lab Results  Component  Value Date   CHOL 168 01/13/2023   TRIG 62 01/13/2023   HDL 57 01/13/2023   CHOLHDL 2.9 01/13/2023   VLDL 12 01/13/2023   LDLCALC 99 01/13/2023   LDLCALC 104 (H) 05/17/2022    Physical Findings: AIMS:  , ,  ,  ,    CIWA:    COWS:     Musculoskeletal: Strength & Muscle Tone: within normal limits Gait & Station: normal Patient leans: N/A  Psychiatric Specialty Exam:  Presentation  General Appearance:  Appropriate for Environment  Eye Contact: Good  Speech: Clear and Coherent  Speech Volume: Normal  Handedness: Right   Mood and Affect  Mood: Hopeless; Depressed  Affect: Appropriate   Thought Process  Thought Processes: Coherent  Descriptions of Associations:Intact  Orientation:Full (Time, Place and Person)  Thought Content:WDL  History of Schizophrenia/Schizoaffective disorder:Yes  Duration of Psychotic Symptoms:Greater than six months  Hallucinations:No data recorded Ideas of Reference:None  Suicidal Thoughts:No data recorded Homicidal Thoughts:No data recorded  Sensorium  Memory: Recent Fair; Immediate Good  Judgment: Poor  Insight: Fair   Chartered certified accountant: Fair  Attention Span: Fair  Recall: [employer] of Knowledge: Fair  Language: Fair   Psychomotor Activity  Psychomotor Activity:No data recorded  Assets  Assets: Manufacturing systems engineer; Desire for Improvement; Housing; Social Support   Sleep  Sleep:No data recorded    Blood pressure 130/80, pulse 84, temperature 97.9 F (36.6 C), temperature source Temporal, resp. rate 16, height 6\' 4"  (1.93 m), weight 97.8 kg, SpO2 100%. Body mass index is 26.23 kg/m.   Treatment Plan Summary: Daily contact with patient to assess  and evaluate symptoms and progress in treatment, Medication management, and Plan continue current medications.  Lyall Faciane Tresea Mall, DO 08/13/2023, 1:05 PM

## 2023-08-13 NOTE — Group Note (Signed)
[facility] LCSW Group Therapy Note    Group Date: 08/13/2023 Start Time: 1400 End Time: 1430  Type of Therapy and Topic:  Group Therapy:  Overcoming Obstacles  Participation Level:  [facility] PARTICIPATION LEVEL: Did Not Attend  Mood:  Description of Group:   In this group patients will be encouraged to explore what they see as obstacles to their own wellness and recovery. They will be guided to discuss their thoughts, feelings, and behaviors related to these obstacles. The group will process together ways to cope with barriers, with attention given to specific choices patients can make. Each patient will be challenged to identify changes they are motivated to make in order to overcome their obstacles. This group will be process-oriented, with patients participating in exploration of their own experiences as well as giving and receiving support and challenge from other group members.  Therapeutic Goals: 1. Patient will identify personal and current obstacles as they relate to admission. 2. Patient will identify barriers that currently interfere with their wellness or overcoming obstacles.  3. Patient will identify feelings, thought process and behaviors related to these barriers. 4. Patient will identify two changes they are willing to make to overcome these obstacles:    Summary of Patient Progress   X   Therapeutic Modalities:   Cognitive Behavioral Therapy Solution Focused Therapy Motivational Interviewing Relapse Prevention Therapy   Elza Rafter, LCSWA

## 2023-08-13 NOTE — Progress Notes (Signed)
   08/13/23 4098  15 Minute Checks  Location Bedroom  Visual Appearance Calm  Behavior Sleeping  Sleep (Behavioral Health Patients Only)  Calculate sleep? (Click Yes once per 24 hr at 0600 safety check) Yes  Documented sleep last 24 hours 10

## 2023-08-13 NOTE — Plan of Care (Signed)

## 2023-08-13 NOTE — Group Note (Signed)
Date:  08/13/2023 Time:  9:21 PM  Group Topic/Focus:  Building Self Esteem:   The Focus of this group is helping patients become aware of the effects of self-esteem on their lives, the things they and others do that enhance or undermine their self-esteem, seeing the relationship between their level of self-esteem and the choices they make and learning ways to enhance self-esteem. Crisis Planning:   The purpose of this group is to help patients create a crisis plan for use upon discharge or in the future, as needed.    Participation Level:  Did Not Attend   Additional Comments:    Osker Mason 08/13/2023, 9:21 PM

## 2023-08-13 NOTE — Plan of Care (Signed)
D: Pt alert and oriented. Pt reports experiencing anxiety/depression at this time. Pt denies experiencing any pain at this time. Pt denies experiencing any SI/HI, or AVH at this time.   A: Scheduled medications administered to pt, per MD orders. Support and encouragement provided. Frequent verbal contact made. Routine safety checks conducted q15 minutes.   R: No adverse drug reactions noted. Pt verbally contracts for safety at this time. Pt compliant with medications and treatment plan. Pt interacts well but minimally with others on the unit. Pt remains safe at this time. Plan of care ongoing.  Problem: Education: Goal: Knowledge of General Education information will improve Description: Including pain rating scale, medication(s)/side effects and non-pharmacologic comfort measures Outcome: Progressing   Problem: Nutrition: Goal: Adequate nutrition will be maintained Outcome: Progressing

## 2023-08-14 DIAGNOSIS — F332 Major depressive disorder, recurrent severe without psychotic features: Secondary | ICD-10-CM | POA: Diagnosis not present

## 2023-08-14 LAB — SARS CORONAVIRUS 2 BY RT PCR: SARS Coronavirus 2 by RT PCR: NEGATIVE

## 2023-08-14 NOTE — Progress Notes (Signed)
   08/14/23 0981  15 Minute Checks  Location Bedroom  Visual Appearance Calm  Behavior Sleeping  Sleep (Behavioral Health Patients Only)  Calculate sleep? (Click Yes once per 24 hr at 0600 safety check) Yes  Documented sleep last 24 hours 15.75

## 2023-08-14 NOTE — Progress Notes (Signed)
[facility] MD Progress Note  08/14/2023 1:47 PM Robert Chan  MRN:  [id number] Subjective: Robert Chan is seen on rounds.  He has been compliant with medications and denies any side effects.  We talked about rehab and he is not interested.  He wants to go and do some geriatric day programs in [place].  He has been in good controls.  He has no complaints. Principal Problem: MDD (major depressive disorder), recurrent severe, without psychosis (HCC) Diagnosis: Principal Problem:   MDD (major depressive disorder), recurrent severe, without psychosis (HCC)  Total Time spent with patient: 15 minutes  Past Psychiatric History: Polysubstance abuse and depression  Past Medical History:  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   Depression    Elevated serum creatinine 08/28/2019   Homelessness 06/19/2020   Homicidal ideations 05/25/2023   Hypertension    Major depressive disorder, recurrent episode, 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/11/2022   Major depressive disorder, single episode, 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/10/2022   MDD (major depressive disorder), recurrent severe, 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   NSTEMI (non-ST elevated myocardial infarction) (HCC)    Schizoaffective disorder (HCC) 11/08/2022   Status post incision and drainage 04/22/2021   Suicidal ideation 06/05/2020   Suicidal ideation 06/05/2020    Past Surgical History:  Procedure Laterality Date   APPENDECTOMY     EYE SURGERY     LEFT HEART CATH AND CORONARY ANGIOGRAPHY N/A 06/05/2020   Procedure: LEFT HEART CATH AND CORONARY ANGIOGRAPHY;  Surgeon: Elder Negus, MD;  Location: [facility] INVASIVE CV [facility];  Service: Cardiovascular;  Laterality: N/A;   PROSTATE SURGERY      Family History:  Family History  Problem Relation Age of Onset   Hypertension Mother    Diabetes Mother    Hypertension Father    Hypertension Sister    Family Psychiatric  History: Unremarkable Social History:  Social History   Substance and Sexual Activity  Alcohol Use Yes   Comment: 4 40oz beer a week     Social History   Substance and Sexual Activity  Drug Use Not Currently   Types: Cocaine   Comment: cocaine use once a month, last use 6 mos ago    Social History   Socioeconomic History   Marital status: Single    Spouse name: Not on file   Number of children: Not on file   Years of education: Not on file   Highest education level: Not on file  Occupational History   Not on file  Tobacco Use   Smoking status: Some Days    Current packs/day: 0.00    Types: Cigarettes    Last attempt to quit: 12/25/2020    Years since quitting: 2.6   Smokeless tobacco: Never   Tobacco comments:    Smokes a couple cigarettes every now and then  Vaping Use   Vaping status: Never Used  Substance and Sexual Activity   Alcohol use: Yes    Comment: 4 40oz beer a week   Drug use: Not Currently    Types: Cocaine    Comment: cocaine use once a month, last use 6 mos ago   Sexual activity: Not Currently  Other Topics Concern   Not  on file  Social History Narrative   His mother passed at age 82 (when he was 1 years old) and his father raised him and his siblings   His parents were pastors as a lot of his sisters and other family members are today   There was a total of 13 children in his family Had 57 sisters, one sister deceased , Had 3 brothers, all brothers deceased   He is the youngest of the 68 and was "always in trouble in my younger years"   Family still live in [place], [place], some in [place], [place]   Has a Daughter and son with a total of 10 grandchildren (6 grand daughters local and 4 grandsons)    Values family   Married twice, present wife incarcerated   Social  Determinants of Health   Financial Resource Strain: Low Risk  (10/19/2022)   Overall Financial Resource Strain (CARDIA)    Difficulty of Paying Living Expenses: Not hard at all  Food Insecurity: No Food Insecurity (08/09/2023)   Hunger Vital Sign    Worried About Running Out of Food in the Last Year: Never true    Ran Out of Food in the Last Year: Never true  Transportation Needs: Unmet Transportation Needs (08/09/2023)   PRAPARE - Administrator, Civil Service (Medical): Yes    Lack of Transportation (Non-Medical): Yes  Physical Activity: Inactive (10/19/2022)   Exercise Vital Sign    Days of Exercise per Week: 0 days    Minutes of Exercise per Session: 0 min  Stress: No Stress Concern Present (10/19/2022)   [name] of Occupational Health - Occupational Stress Questionnaire    Feeling of Stress : Not at all  Social Connections: Moderately Integrated (03/31/2022)   Social Connection and Isolation Panel [NHANES]    Frequency of Communication with Friends and Family: Three times a week    Frequency of Social Gatherings with Friends and Family: Three times a week    Attends Religious Services: 1 to 4 times per year    Active Member of Clubs or Organizations: Yes    Attends Banker Meetings: 1 to 4 times per year    Marital Status: Never married   Additional Social History:                         Sleep: Good  Appetite:  Good  Current Medications: Current Facility-Administered Medications  Medication Dose Route Frequency Provider Last Rate Last Admin   acetaminophen (TYLENOL) tablet 650 mg  650 mg Oral Q6H PRN Motley-Mangrum, Jadeka A, PMHNP   650 mg at 08/13/23 0737   albuterol (VENTOLIN HFA) 108 (90 Base) MCG/ACT inhaler 2 puff  2 puff Inhalation Q6H PRN Motley-Mangrum, Jadeka A, PMHNP       alum & mag hydroxide-simeth (MAALOX/MYLANTA) 200-200-20 MG/5ML suspension 30 mL  30 mL Oral Q4H PRN Motley-Mangrum, Jadeka A, PMHNP        ARIPiprazole (ABILIFY) tablet 10 mg  10 mg Oral Daily Motley-Mangrum, Jadeka A, PMHNP   10 mg at 08/14/23 0850   atorvastatin (LIPITOR) tablet 80 mg  80 mg Oral Daily [name], DO   80 mg at 08/14/23 0850   diltiazem (CARDIZEM CD) 24 hr capsule 180 mg  180 mg Oral Daily [name], DO   180 mg at 08/14/23 0850   diphenhydrAMINE (BENADRYL) capsule 50 mg  50 mg Oral TID PRN Motley-Mangrum, Ezra Sites, PMHNP  Or   diphenhydrAMINE (BENADRYL) injection 50 mg  50 mg Intramuscular TID PRN Motley-Mangrum, Geralynn Ochs A, PMHNP       doxepin (SINEQUAN) capsule 50 mg  50 mg Oral QHS [name], DO   50 mg at 08/13/23 2139   guaiFENesin (MUCINEX) 12 hr tablet 600 mg  600 mg Oral Q6H PRN Jearld Lesch, NP   600 mg at 08/13/23 0737   haloperidol (HALDOL) tablet 5 mg  5 mg Oral TID PRN Motley-Mangrum, Geralynn Ochs A, PMHNP       Or   haloperidol lactate (HALDOL) injection 5 mg  5 mg Intramuscular TID PRN Motley-Mangrum, Geralynn Ochs A, PMHNP       hydrOXYzine (ATARAX) tablet 50 mg  50 mg Oral TID PRN [name], DO   50 mg at 08/10/23 2115   latanoprost (XALATAN) 0.005 % ophthalmic solution 1 drop  1 drop Both Eyes QHS [name], DO   1 drop at 08/13/23 2139   magnesium hydroxide (MILK OF MAGNESIA) suspension 30 mL  30 mL Oral Daily PRN Motley-Mangrum, Jadeka A, PMHNP       mometasone-formoterol (DULERA) 200-5 MCG/ACT inhaler 2 puff  2 puff Inhalation BID Motley-Mangrum, Jadeka A, PMHNP   2 puff at 08/14/23 0852   polyethylene glycol (MIRALAX / GLYCOLAX) packet 17 g  17 g Oral Daily [name], DO   17 g at 08/14/23 1610   traZODone (DESYREL) tablet 50 mg  50 mg Oral QHS PRN [name], DO   50 mg at 08/13/23 2142    Lab Results:  Results for orders placed or performed during the hospital encounter of 08/08/23 (from the past 48 hour(s))  Respiratory (~20 pathogens) panel by PCR     Status: None   Collection Time: 08/13/23  3:22  PM   Specimen: Nasopharyngeal Swab; Respiratory  Result Value Ref Range   Adenovirus NOT DETECTED NOT DETECTED   Coronavirus 229E NOT DETECTED NOT DETECTED    Comment: (NOTE) The Coronavirus on the Respiratory Panel, DOES NOT test for the novel  Coronavirus (2019 nCoV)    Coronavirus HKU1 NOT DETECTED NOT DETECTED   Coronavirus NL63 NOT DETECTED NOT DETECTED   Coronavirus OC43 NOT DETECTED NOT DETECTED   Metapneumovirus NOT DETECTED NOT DETECTED   Rhinovirus / Enterovirus NOT DETECTED NOT DETECTED   Influenza A NOT DETECTED NOT DETECTED   Influenza B NOT DETECTED NOT DETECTED   Parainfluenza Virus 1 NOT DETECTED NOT DETECTED   Parainfluenza Virus 2 NOT DETECTED NOT DETECTED   Parainfluenza Virus 3 NOT DETECTED NOT DETECTED   Parainfluenza Virus 4 NOT DETECTED NOT DETECTED   Respiratory Syncytial Virus NOT DETECTED NOT DETECTED   Bordetella pertussis NOT DETECTED NOT DETECTED   Bordetella Parapertussis NOT DETECTED NOT DETECTED   Chlamydophila pneumoniae NOT DETECTED NOT DETECTED   Mycoplasma pneumoniae NOT DETECTED NOT DETECTED    Comment: Performed at [facility], [address] [place], [place] [address]  SARS Coronavirus 2 by RT PCR (hospital order, performed in [facility] hospital lab) *cepheid single result test* Anterior Nasal Swab     Status: None   Collection Time: 08/14/23  8:00 AM   Specimen: Anterior Nasal Swab  Result Value Ref Range   SARS Coronavirus 2 by RT PCR NEGATIVE NEGATIVE    Comment: (NOTE) SARS-CoV-2 target nucleic acids are NOT DETECTED.  The SARS-CoV-2 RNA is generally detectable in upper and lower respiratory specimens during the acute phase of infection. The lowest  concentration of SARS-CoV-2 viral copies this assay can detect is 250 copies / mL. A negative result does not preclude SARS-CoV-2 infection and should not be used as the sole basis for treatment or other patient management decisions.  A negative result may occur with improper  specimen collection / handling, submission of specimen other than nasopharyngeal swab, presence of viral mutation(s) within the areas targeted by this assay, and inadequate number of viral copies (<250 copies / mL). A negative result must be combined with clinical observations, patient history, and epidemiological information.  Fact Sheet for Patients:   RoadLapTop.co.za  Fact Sheet for Healthcare Providers: http://kim-miller.com/  This test is not yet approved or  cleared by the [place] FDA and has been authorized for detection and/or diagnosis of SARS-CoV-2 by FDA under an Emergency Use Authorization (EUA).  This EUA will remain in effect (meaning this test can be used) for the duration of the COVID-19 declaration under Section 564(b)(1) of the Act, 21 U.S.C. section 360bbb-3(b)(1), unless the authorization is terminated or revoked sooner.  Performed at [facility], [address] [place], [place] [address]     Blood Alcohol level:  Lab Results  Component Value Date   [facility] <10 08/08/2023   ETH <10 07/15/2023    Metabolic Disorder Labs: Lab Results  Component Value Date   HGBA1C 5.2 01/13/2023   MPG 103 01/13/2023   MPG 91.06 09/07/2020   No results found for: "PROLACTIN" Lab Results  Component Value Date   CHOL 168 01/13/2023   TRIG 62 01/13/2023   HDL 57 01/13/2023   CHOLHDL 2.9 01/13/2023   VLDL 12 01/13/2023   LDLCALC 99 01/13/2023   LDLCALC 104 (H) 05/17/2022    Physical Findings: AIMS:  , ,  ,  ,    CIWA:    COWS:     Musculoskeletal: Strength & Muscle Tone: within normal limits Gait & Station: normal Patient leans: N/A  Psychiatric Specialty Exam:  Presentation  General Appearance:  Appropriate for Environment  Eye Contact: Good  Speech: Clear and Coherent  Speech Volume: Normal  Handedness: Right   Mood and Affect  Mood: Hopeless;  Depressed  Affect: Appropriate   Thought Process  Thought Processes: Coherent  Descriptions of Associations:Intact  Orientation:Full (Time, Place and Person)  Thought Content:WDL  History of Schizophrenia/Schizoaffective disorder:Yes  Duration of Psychotic Symptoms:Greater than six months  Hallucinations:No data recorded Ideas of Reference:None  Suicidal Thoughts:No data recorded Homicidal Thoughts:No data recorded  Sensorium  Memory: Recent Fair; Immediate Good  Judgment: Poor  Insight: Fair   Chartered certified accountant: Fair  Attention Span: Fair  Recall: [employer] of Knowledge: Fair  Language: Fair   Psychomotor Activity  Psychomotor Activity:No data recorded  Assets  Assets: Communication Skills; Desire for Improvement; Housing; Social Support   Sleep  Sleep:No data recorded    Blood pressure (!) 137/123, pulse 88, temperature 97.9 F (36.6 C), resp. rate 16, height 6\' 4"  (1.93 m), weight 97.8 kg, SpO2 91%. Body mass index is 26.23 kg/m.   Treatment Plan Summary: Daily contact with patient to assess and evaluate symptoms and progress in treatment, Medication management, and Plan continue current medications.  [name], DO 08/14/2023, 1:48 PM

## 2023-08-14 NOTE — Progress Notes (Signed)
Patient denies SI, HI, and AVH. He states he is feeling better and does not have any chills, cough, or cold symptoms. COVID-19 PCR test was ordered and came back negative. Patient was informed he could come out of his room for meals and groups. He denies pain. He is compliant with scheduled medications.

## 2023-08-14 NOTE — Progress Notes (Signed)
D- Patient alert and oriented. Received in bed with eyes open. RR even and unlabored. Denies anxiety and depression.  Denies SI, HI, AVH, and pain.Marland Kitchen A- Scheduled medications administered to patient, per MD orders. PRN given for insomnia. Support and encouragement provided.  Routine safety checks conducted every 15 minutes.  Patient informed to notify staff with problems or concerns. R- No adverse drug reactions noted. Patient contracts for safety at this time. Patient compliant with medications and treatment plan. Patient receptive, calm, and cooperative. Patient remains safe at this time.

## 2023-08-15 DIAGNOSIS — F332 Major depressive disorder, recurrent severe without psychotic features: Secondary | ICD-10-CM | POA: Diagnosis not present

## 2023-08-15 MED ORDER — ARIPIPRAZOLE 5 MG PO TABS
5.0000 mg | ORAL_TABLET | Freq: Every day | ORAL | Status: DC
  Administered 2023-08-16 – 2023-08-22 (×7): 5 mg via ORAL
  Filled 2023-08-15 (×7): qty 1

## 2023-08-15 NOTE — Progress Notes (Signed)
[facility] MD Progress Note  08/15/2023 11:50 AM Robert Chan  MRN:  [id number] Subjective: Robert Chan is seen on rounds.  He says that the Abilify is too strong I would like it decreased.  Nurses report no issues.  He has been pleasant and cooperative and compliant with medication.  He would like a day treatment program and not rehab. Principal Problem: MDD (major depressive disorder), recurrent severe, without psychosis (HCC) Diagnosis: Principal Problem:   MDD (major depressive disorder), recurrent severe, without psychosis (HCC)  Total Time spent with patient: 15 minutes  Past Psychiatric History: Long history of depression and polysubstance abuse  Past Medical History:  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   Depression    Elevated serum creatinine 08/28/2019   Homelessness 06/19/2020   Homicidal ideations 05/25/2023   Hypertension    Major depressive disorder, recurrent episode, 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/11/2022   Major depressive disorder, single episode, 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/10/2022   MDD (major depressive disorder), recurrent severe, 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   NSTEMI (non-ST elevated myocardial infarction) (HCC)    Schizoaffective disorder (HCC) 11/08/2022   Status post incision and drainage 04/22/2021   Suicidal ideation 06/05/2020   Suicidal ideation 06/05/2020    Past Surgical History:  Procedure Laterality Date   APPENDECTOMY     EYE SURGERY     LEFT HEART CATH AND CORONARY ANGIOGRAPHY N/A 06/05/2020   Procedure: LEFT HEART CATH AND CORONARY ANGIOGRAPHY;  Surgeon: Elder Negus, MD;  Location: [facility];  Service: Cardiovascular;  Laterality: N/A;   PROSTATE SURGERY     Family  History:  Family History  Problem Relation Age of Onset   Hypertension Mother    Diabetes Mother    Hypertension Father    Hypertension Sister    Family Psychiatric  History: Unremarkable Social History:  Social History   Substance and Sexual Activity  Alcohol Use Yes   Comment: 4 40oz beer a week     Social History   Substance and Sexual Activity  Drug Use Not Currently   Types: Cocaine   Comment: cocaine use once a month, last use 6 mos ago    Social History   Socioeconomic History   Marital status: Single    Spouse name: Not on file   Number of children: Not on file   Years of education: Not on file   Highest education level: Not on file  Occupational History   Not on file  Tobacco Use   Smoking status: Some Days    Current packs/day: 0.00    Types: Cigarettes    Last attempt to quit: 12/25/2020    Years since quitting: 2.6   Smokeless tobacco: Never   Tobacco comments:    Smokes a couple cigarettes every now and then  Vaping Use   Vaping status: Never Used  Substance and Sexual Activity   Alcohol use: Yes    Comment: 4 40oz beer a week   Drug use: Not Currently    Types: Cocaine    Comment: cocaine use once a month, last use 6 mos ago   Sexual activity: Not Currently  Other Topics Concern   Not on file  Social History Narrative   His mother passed at age 84 (when he was 36 years old) and his father raised him and his siblings   His parents were pastors as a lot of his sisters and other family members are today   There was a total of 13 children in his family Had 77 sisters, one sister deceased , Had 3 brothers, all brothers deceased   He is the youngest of the 30 and was "always in trouble in my younger years"   Family still live in [place], [place], some in [place], [place]   Has a Daughter and son with a total of 10 grandchildren (6 grand daughters local and 4 grandsons)    Values family   Married twice, present wife incarcerated   Social  Determinants of Health   Financial Resource Strain: Low Risk  (10/19/2022)   Overall Financial Resource Strain (CARDIA)    Difficulty of Paying Living Expenses: Not hard at all  Food Insecurity: No Food Insecurity (08/09/2023)   Hunger Vital Sign    Worried About Running Out of Food in the Last Year: Never true    Ran Out of Food in the Last Year: Never true  Transportation Needs: Unmet Transportation Needs (08/09/2023)   PRAPARE - Administrator, Civil Service (Medical): Yes    Lack of Transportation (Non-Medical): Yes  Physical Activity: Inactive (10/19/2022)   Exercise Vital Sign    Days of Exercise per Week: 0 days    Minutes of Exercise per Session: 0 min  Stress: No Stress Concern Present (10/19/2022)   [name] of Occupational Health - Occupational Stress Questionnaire    Feeling of Stress : Not at all  Social Connections: Moderately Integrated (03/31/2022)   Social Connection and Isolation Panel [NHANES]    Frequency of Communication with Friends and Family: Three times a week    Frequency of Social Gatherings with Friends and Family: Three times a week    Attends Religious Services: 1 to 4 times per year    Active Member of Clubs or Organizations: Yes    Attends Banker Meetings: 1 to 4 times per year    Marital Status: Never married   Additional Social History:                         Sleep: Good  Appetite:  Good  Current Medications: Current Facility-Administered Medications  Medication Dose Route Frequency Provider Last Rate Last Admin   acetaminophen (TYLENOL) tablet 650 mg  650 mg Oral Q6H PRN Motley-Mangrum, Jadeka A, PMHNP   650 mg at 08/14/23 1508   albuterol (VENTOLIN HFA) 108 (90 Base) MCG/ACT inhaler 2 puff  2 puff Inhalation Q6H PRN Motley-Mangrum, Jadeka A, PMHNP       alum & mag hydroxide-simeth (MAALOX/MYLANTA) 200-200-20 MG/5ML suspension 30 mL  30 mL Oral Q4H PRN Motley-Mangrum, Jadeka A, PMHNP        ARIPiprazole (ABILIFY) tablet 10 mg  10 mg Oral Daily Motley-Mangrum, Jadeka A, PMHNP   10 mg at 08/15/23 0945   atorvastatin (LIPITOR) tablet 80 mg  80 mg Oral Daily [name], DO   80 mg at 08/15/23 0945   diltiazem (CARDIZEM CD) 24 hr capsule 180 mg  180 mg Oral Daily [name], DO   180 mg at 08/15/23 0945   diphenhydrAMINE (BENADRYL) capsule 50 mg  50 mg Oral TID PRN Motley-Mangrum, Ezra Sites, PMHNP  Or   diphenhydrAMINE (BENADRYL) injection 50 mg  50 mg Intramuscular TID PRN Motley-Mangrum, Geralynn Ochs A, PMHNP       doxepin (SINEQUAN) capsule 50 mg  50 mg Oral QHS [name], DO   50 mg at 08/14/23 2109   guaiFENesin (MUCINEX) 12 hr tablet 600 mg  600 mg Oral Q6H PRN Jearld Lesch, NP   600 mg at 08/13/23 0737   haloperidol (HALDOL) tablet 5 mg  5 mg Oral TID PRN Motley-Mangrum, Geralynn Ochs A, PMHNP       Or   haloperidol lactate (HALDOL) injection 5 mg  5 mg Intramuscular TID PRN Motley-Mangrum, Geralynn Ochs A, PMHNP       hydrOXYzine (ATARAX) tablet 50 mg  50 mg Oral TID PRN [name], DO   50 mg at 08/14/23 2109   latanoprost (XALATAN) 0.005 % ophthalmic solution 1 drop  1 drop Both Eyes QHS [name], DO   1 drop at 08/14/23 2109   magnesium hydroxide (MILK OF MAGNESIA) suspension 30 mL  30 mL Oral Daily PRN Motley-Mangrum, Jadeka A, PMHNP       mometasone-formoterol (DULERA) 200-5 MCG/ACT inhaler 2 puff  2 puff Inhalation BID Motley-Mangrum, Jadeka A, PMHNP   2 puff at 08/15/23 0945   polyethylene glycol (MIRALAX / GLYCOLAX) packet 17 g  17 g Oral Daily [name], DO   17 g at 08/15/23 2130   traZODone (DESYREL) tablet 50 mg  50 mg Oral QHS PRN [name], DO   50 mg at 08/14/23 2109    Lab Results:  Results for orders placed or performed during the hospital encounter of 08/08/23 (from the past 48 hour(s))  Respiratory (~20 pathogens) panel by PCR     Status: None   Collection Time: 08/13/23  3:22  PM   Specimen: Nasopharyngeal Swab; Respiratory  Result Value Ref Range   Adenovirus NOT DETECTED NOT DETECTED   Coronavirus 229E NOT DETECTED NOT DETECTED    Comment: (NOTE) The Coronavirus on the Respiratory Panel, DOES NOT test for the novel  Coronavirus (2019 nCoV)    Coronavirus HKU1 NOT DETECTED NOT DETECTED   Coronavirus NL63 NOT DETECTED NOT DETECTED   Coronavirus OC43 NOT DETECTED NOT DETECTED   Metapneumovirus NOT DETECTED NOT DETECTED   Rhinovirus / Enterovirus NOT DETECTED NOT DETECTED   Influenza A NOT DETECTED NOT DETECTED   Influenza B NOT DETECTED NOT DETECTED   Parainfluenza Virus 1 NOT DETECTED NOT DETECTED   Parainfluenza Virus 2 NOT DETECTED NOT DETECTED   Parainfluenza Virus 3 NOT DETECTED NOT DETECTED   Parainfluenza Virus 4 NOT DETECTED NOT DETECTED   Respiratory Syncytial Virus NOT DETECTED NOT DETECTED   Bordetella pertussis NOT DETECTED NOT DETECTED   Bordetella Parapertussis NOT DETECTED NOT DETECTED   Chlamydophila pneumoniae NOT DETECTED NOT DETECTED   Mycoplasma pneumoniae NOT DETECTED NOT DETECTED    Comment: Performed at [facility], [address] [place], [place] [address]  SARS Coronavirus 2 by RT PCR (hospital order, performed in [facility] hospital lab) *cepheid single result test* Anterior Nasal Swab     Status: None   Collection Time: 08/14/23  8:00 AM   Specimen: Anterior Nasal Swab  Result Value Ref Range   SARS Coronavirus 2 by RT PCR NEGATIVE NEGATIVE    Comment: (NOTE) SARS-CoV-2 target nucleic acids are NOT DETECTED.  The SARS-CoV-2 RNA is generally detectable in upper and lower respiratory specimens during the acute phase of infection. The lowest  concentration of SARS-CoV-2 viral copies this assay can detect is 250 copies / mL. A negative result does not preclude SARS-CoV-2 infection and should not be used as the sole basis for treatment or other patient management decisions.  A negative result may occur with improper  specimen collection / handling, submission of specimen other than nasopharyngeal swab, presence of viral mutation(s) within the areas targeted by this assay, and inadequate number of viral copies (<250 copies / mL). A negative result must be combined with clinical observations, patient history, and epidemiological information.  Fact Sheet for Patients:   RoadLapTop.co.za  Fact Sheet for Healthcare Providers: http://kim-miller.com/  This test is not yet approved or  cleared by the [place] FDA and has been authorized for detection and/or diagnosis of SARS-CoV-2 by FDA under an Emergency Use Authorization (EUA).  This EUA will remain in effect (meaning this test can be used) for the duration of the COVID-19 declaration under Section 564(b)(1) of the Act, 21 U.S.C. section 360bbb-3(b)(1), unless the authorization is terminated or revoked sooner.  Performed at [facility], [address] [place], [place] [address]     Blood Alcohol level:  Lab Results  Component Value Date   [facility] <10 08/08/2023   ETH <10 07/15/2023    Metabolic Disorder Labs: Lab Results  Component Value Date   HGBA1C 5.2 01/13/2023   MPG 103 01/13/2023   MPG 91.06 09/07/2020   No results found for: "PROLACTIN" Lab Results  Component Value Date   CHOL 168 01/13/2023   TRIG 62 01/13/2023   HDL 57 01/13/2023   CHOLHDL 2.9 01/13/2023   VLDL 12 01/13/2023   LDLCALC 99 01/13/2023   LDLCALC 104 (H) 05/17/2022    Physical Findings: AIMS:  , ,  ,  ,    CIWA:    COWS:     Musculoskeletal: Strength & Muscle Tone: within normal limits Gait & Station: normal Patient leans: N/A  Psychiatric Specialty Exam:  Presentation  General Appearance:  Appropriate for Environment  Eye Contact: Good  Speech: Clear and Coherent  Speech Volume: Normal  Handedness: Right   Mood and Affect  Mood: Hopeless;  Depressed  Affect: Appropriate   Thought Process  Thought Processes: Coherent  Descriptions of Associations:Intact  Orientation:Full (Time, Place and Person)  Thought Content:WDL  History of Schizophrenia/Schizoaffective disorder:Yes  Duration of Psychotic Symptoms:Greater than six months  Hallucinations:No data recorded Ideas of Reference:None  Suicidal Thoughts:No data recorded Homicidal Thoughts:No data recorded  Sensorium  Memory: Recent Fair; Immediate Good  Judgment: Poor  Insight: Fair   Chartered certified accountant: Fair  Attention Span: Fair  Recall: [employer] of Knowledge: Fair  Language: Fair   Psychomotor Activity  Psychomotor Activity:No data recorded  Assets  Assets: Communication Skills; Desire for Improvement; Housing; Social Support   Sleep  Sleep:No data recorded   Physical Exam: Physical Exam Vitals and nursing note reviewed.  Constitutional:      Appearance: Normal appearance. He is normal weight.  Neurological:     General: No focal deficit present.     Mental Status: He is alert and oriented to person, place, and time.  Psychiatric:        Attention and Perception: Attention normal.        Mood and Affect: Mood is depressed. Affect is flat.        Behavior: Behavior normal. Behavior is cooperative.        Thought Content: Thought content is paranoid.  Cognition and Memory: Cognition normal.        Judgment: Judgment normal.    Review of Systems  Constitutional: Negative.   HENT: Negative.    Eyes: Negative.   Respiratory: Negative.    Cardiovascular: Negative.   Gastrointestinal: Negative.   Genitourinary: Negative.   Musculoskeletal: Negative.   Skin: Negative.   Neurological: Negative.   Endo/Heme/Allergies: Negative.   Psychiatric/Behavioral:  Positive for depression.    Blood pressure 116/74, pulse 81, temperature 98.2 F (36.8 C), resp. rate 16, height 6\' 4"  (1.93 m), weight 97.8  kg, SpO2 99%. Body mass index is 26.23 kg/m.   Treatment Plan Summary: Daily contact with patient to assess and evaluate symptoms and progress in treatment, Medication management, and Plan decrease Abilify to 5 mg/day.  [name], DO 08/15/2023, 11:50 AM

## 2023-08-15 NOTE — Group Note (Signed)
Recreation Therapy Group Note   Group Topic:Coping Skills  Group Date: 08/15/2023 Start Time: 1415 End Time: 1500 Facilitators: Rosina Lowenstein, LRT, CTRS Location: Courtyard  Group Description: Music Reminisce. LRT encouraged patients to think of their favorite song(s) that reminded them of a positive memory or time in their life. LRT encouraged patient to talk about that memory aloud to the group. LRT played the song through a speaker for all to hear. LRT and patients discussed how thinking of a positive memory or time in their life can be used as a coping skill in everyday life post discharge.    Goal Area(s) Addressed: Patient will increase verbal communication by conversing with peers. Patient will contribute to group discussion with minimal prompting. Patient will reminisce a positive memory or moment in their life.    Affect/Mood: Appropriate   Participation Level: Active and Engaged   Participation Quality: Independent   Behavior: Appropriate, Calm, and Cooperative   Speech/Thought Process: Coherent   Insight: Good   Judgement: Good   Modes of Intervention: Activity   Patient Response to Interventions:  Attentive, Engaged, Interested , and Receptive   Education Outcome:  Acknowledges education   Clinical Observations/Individualized Feedback: Robert Chan was active in their participation of session activities and group discussion. Pt identified "70's and 90's music is my favorite" and shared that it makes him think of when he was younger. Pt interacted well with LRT and peers duration of session.   Plan: Continue to engage patient in RT group sessions 2-3x/week.   Rosina Lowenstein, LRT, CTRS 08/15/2023 3:03 PM

## 2023-08-15 NOTE — [facility] Counselor (Signed)
CSW attempted to call [name] [phone]) to complete SPE, unable to reach, operator states person is temporarily unavailable, CSW unable to leave VM.    CSW contacted [name] at [phone] to complete SPE, operator states the subscriber is not in service, CSW unable to leave VM.   Reynaldo Minium, MSW, [place] 08/15/2023 2:38 PM

## 2023-08-15 NOTE — [facility] Suicide Risk Assessment (Signed)
[facility] INPATIENT:  Family/Significant Other Suicide Prevention Education  Suicide Prevention Education:  Contact Attempts: Jones Viviani, daughter [phone] and [phone])   has been identified by the patient as the family member/significant other with whom the patient will be residing, and identified as the person(s) who will aid the patient in the event of a mental health crisis.  With written consent from the patient, two attempts were made to provide suicide prevention education, prior to and/or following the patient's discharge.  We were unsuccessful in providing suicide prevention education.  A suicide education pamphlet was given to the patient to share with family/significant other.  Date and time of first attempt:08/12/2023 Date and time of second attempt:08/15/2023  Elza Rafter 08/15/2023, 2:40 PM

## 2023-08-15 NOTE — Progress Notes (Signed)
   08/15/23 0558  15 Minute Checks  Location Bedroom  Visual Appearance Calm  Behavior Sleeping  Sleep (Behavioral Health Patients Only)  Calculate sleep? (Click Yes once per 24 hr at 0600 safety check) Yes  Documented sleep last 24 hours 12.25

## 2023-08-15 NOTE — Progress Notes (Signed)
Patient is a voluntary admission to [facility] for MDD. Patient complained to night shift that his abilify was too strong so message passed on to Dr and dose was decreased. States the miralax did help him with BM yesterday but still wants miralax today as well.  Patient is calm, cooperative, friendly with staff and peers. Participated in group activities. Denies SI, HI, Anxiety, AVH, and Depression at this time, but states he has passive SI thoughts at times. Will continue to monitor.

## 2023-08-15 NOTE — Group Note (Signed)
Date:  08/15/2023 Time:  11:37 AM  Group Topic/Focus:  Coping With Mental Health Crisis:   The purpose of this group is to help patients identify strategies for coping with mental health crisis.  Group discusses possible causes of crisis and ways to manage them effectively.    Participation Level:  Did Not Attend  Participation Quality:    Affect:    Cognitive:    Insight:   Engagement in Group:    Modes of Intervention:    Additional Comments:    Robert Chan 08/15/2023, 11:37 AM

## 2023-08-15 NOTE — Group Note (Signed)
Date:  08/15/2023 Time:  1:06 AM  Group Topic/Focus:  Making Healthy Choices:   The focus of this group is to help patients identify negative/unhealthy choices they were using prior to admission and identify positive/healthier coping strategies to replace them upon discharge.    Participation Level:  Active  Participation Quality:  Appropriate  Affect:  Appropriate  Cognitive:  Appropriate  Insight: Appropriate  Engagement in Group:  Engaged  Modes of Intervention:  Discussion  Additional Comments:    [name] 08/15/2023, 1:06 AM

## 2023-08-15 NOTE — Progress Notes (Signed)
   08/14/23 1950  Psych Admission Type (Psych Patients Only)  Admission Status Voluntary  Psychosocial Assessment  Patient Complaints Anxiety;Depression  Eye Contact Fair  Facial Expression Flat  Affect Depressed  Speech Logical/coherent  Interaction Assertive  Motor Activity Slow  Appearance/Hygiene Unremarkable  Behavior Characteristics Cooperative;Appropriate to situation  Mood Depressed;Pleasant  Thought Process  Coherency WDL  Content WDL  Delusions None reported or observed  Perception Hallucinations  Hallucination Auditory;Visual  Judgment Limited  Confusion None  Danger to Self  Current suicidal ideation? Passive  Self-Injurious Behavior Some self-injurious ideation observed or expressed.  No lethal plan expressed   Agreement Not to Harm Self Yes  Description of Agreement verbal  Danger to Others  Danger to Others None reported or observed   Progress note   D: Pt seen in dayroom. Pt denies HI. Endorses passive SI without a plan and contracts for safety. Pt also endorses AVH.  Pt rates pain  0/10. Pt rates anxiety  8/10 and depression  low/10. Pt's main concern is that the medications he takes during the day are making him fatigued. "When I go home, I've gotta get moving during the day. I can't be sleepy." Pt reports good appetite and sleep at night. States the hallucinations are still there. Says he was more depressed earlier when he had to stay in his room until the COVID test resulted d/t his symptoms. "It made me worry. I had had a test at the other place and it was negative so I didn't know what was going on." No other concerns noted at this time.  A: Pt provided support and encouragement. Pt given scheduled medication as prescribed. PRNs as appropriate. Q15 min checks for safety.   R: Pt safe on the unit. Will continue to monitor.

## 2023-08-15 NOTE — Plan of Care (Signed)
Problem: Health Behavior/Discharge Planning: Goal: Ability to manage health-related needs will improve Outcome: Progressing   Problem: Clinical Measurements: Goal: Ability to maintain clinical measurements within normal limits will improve Outcome: Progressing Goal: Will remain free from infection Outcome: Progressing Goal: Respiratory complications will improve Outcome: Progressing Goal: Cardiovascular complication will be avoided Outcome: Progressing   Problem: Activity: Goal: Risk for activity intolerance will decrease Outcome: Progressing   Problem: Nutrition: Goal: Adequate nutrition will be maintained Outcome: Progressing   Problem: Coping: Goal: Level of anxiety will decrease Outcome: Progressing

## 2023-08-15 NOTE — BH IP Treatment Plan (Signed)
Interdisciplinary Treatment and Diagnostic Plan Update  08/15/2023 Time of Session: 10:30 AM  Robert Chan MRN: [id number]  Principal Diagnosis: MDD (major depressive disorder), recurrent severe, without psychosis (HCC)  Secondary Diagnoses: Principal Problem:   MDD (major depressive disorder), recurrent severe, without psychosis (HCC)   Current Medications:  Current Facility-Administered Medications  Medication Dose Route Frequency Provider Last Rate Last Admin   acetaminophen (TYLENOL) tablet 650 mg  650 mg Oral Q6H PRN Motley-Mangrum, Jadeka A, PMHNP   650 mg at 08/14/23 1508   albuterol (VENTOLIN HFA) 108 (90 Base) MCG/ACT inhaler 2 puff  2 puff Inhalation Q6H PRN Motley-Mangrum, Jadeka A, PMHNP       alum & mag hydroxide-simeth (MAALOX/MYLANTA) 200-200-20 MG/5ML suspension 30 mL  30 mL Oral Q4H PRN Motley-Mangrum, Jadeka A, PMHNP       [START ON 08/16/2023] ARIPiprazole (ABILIFY) tablet 5 mg  5 mg Oral Daily [name], DO       atorvastatin (LIPITOR) tablet 80 mg  80 mg Oral Daily [name], DO   80 mg at 08/15/23 0945   diltiazem (CARDIZEM CD) 24 hr capsule 180 mg  180 mg Oral Daily [name], DO   180 mg at 08/15/23 0945   diphenhydrAMINE (BENADRYL) capsule 50 mg  50 mg Oral TID PRN Motley-Mangrum, Geralynn Ochs A, PMHNP       Or   diphenhydrAMINE (BENADRYL) injection 50 mg  50 mg Intramuscular TID PRN Motley-Mangrum, Jadeka A, PMHNP       doxepin (SINEQUAN) capsule 50 mg  50 mg Oral QHS [name], DO   50 mg at 08/14/23 2109   guaiFENesin (MUCINEX) 12 hr tablet 600 mg  600 mg Oral Q6H PRN Jearld Lesch, NP   600 mg at 08/13/23 0737   haloperidol (HALDOL) tablet 5 mg  5 mg Oral TID PRN Motley-Mangrum, Jadeka A, PMHNP       Or   haloperidol lactate (HALDOL) injection 5 mg  5 mg Intramuscular TID PRN Motley-Mangrum, Jadeka A, PMHNP       hydrOXYzine (ATARAX) tablet 50 mg  50 mg Oral TID PRN [name], DO   50 mg at  08/14/23 2109   latanoprost (XALATAN) 0.005 % ophthalmic solution 1 drop  1 drop Both Eyes QHS [name], DO   1 drop at 08/14/23 2109   magnesium hydroxide (MILK OF MAGNESIA) suspension 30 mL  30 mL Oral Daily PRN Motley-Mangrum, Jadeka A, PMHNP       mometasone-formoterol (DULERA) 200-5 MCG/ACT inhaler 2 puff  2 puff Inhalation BID Motley-Mangrum, Jadeka A, PMHNP   2 puff at 08/15/23 0945   polyethylene glycol (MIRALAX / GLYCOLAX) packet 17 g  17 g Oral Daily [name], DO   17 g at 08/15/23 0949   traZODone (DESYREL) tablet 50 mg  50 mg Oral QHS PRN [name], DO   50 mg at 08/14/23 2109   PTA Medications: Medications Prior to Admission  Medication Sig Dispense Refill Last Dose   albuterol (VENTOLIN HFA) 108 (90 Base) MCG/ACT inhaler Inhale 2 puffs into the lungs every 6 (six) hours as needed for shortness of breath. 18 g 1    mometasone-formoterol (DULERA) 200-5 MCG/ACT AERO Inhale 2 puffs into the lungs 2 (two) times daily. 1 each 1     Patient Stressors:    Patient Strengths:    Treatment Modalities: Medication Management, Group therapy, Case management,  1 to 1 session with clinician, Psychoeducation, Recreational therapy.  Physician Treatment Plan for Primary Diagnosis: MDD (major depressive disorder), recurrent severe, without psychosis (HCC) Long Term Goal(s): Improvement in symptoms so as ready for discharge   Short Term Goals: Ability to identify changes in lifestyle to reduce recurrence of condition will improve Ability to verbalize feelings will improve Ability to disclose and discuss suicidal ideas Ability to demonstrate self-control will improve Ability to identify and develop effective coping behaviors will improve Ability to maintain clinical measurements within normal limits will improve Compliance with prescribed medications will improve Ability to identify triggers associated with substance abuse/mental health issues will  improve  Medication Management: Evaluate patient's response, side effects, and tolerance of medication regimen.  Therapeutic Interventions: 1 to 1 sessions, Unit Group sessions and Medication administration.  Evaluation of Outcomes: Progressing  Physician Treatment Plan for Secondary Diagnosis: Principal Problem:   MDD (major depressive disorder), recurrent severe, without psychosis (HCC)  Long Term Goal(s): Improvement in symptoms so as ready for discharge   Short Term Goals: Ability to identify changes in lifestyle to reduce recurrence of condition will improve Ability to verbalize feelings will improve Ability to disclose and discuss suicidal ideas Ability to demonstrate self-control will improve Ability to identify and develop effective coping behaviors will improve Ability to maintain clinical measurements within normal limits will improve Compliance with prescribed medications will improve Ability to identify triggers associated with substance abuse/mental health issues will improve     Medication Management: Evaluate patient's response, side effects, and tolerance of medication regimen.  Therapeutic Interventions: 1 to 1 sessions, Unit Group sessions and Medication administration.  Evaluation of Outcomes: Progressing   RN Treatment Plan for Primary Diagnosis: MDD (major depressive disorder), recurrent severe, without psychosis (HCC) Long Term Goal(s): Knowledge of disease and therapeutic regimen to maintain health will improve  Short Term Goals: Ability to remain free from injury will improve, Ability to verbalize frustration and anger appropriately will improve, Ability to demonstrate self-control, Ability to participate in decision making will improve, Ability to verbalize feelings will improve, Ability to disclose and discuss suicidal ideas, Ability to identify and develop effective coping behaviors will improve, and Compliance with prescribed medications will  improve  Medication Management: RN will administer medications as ordered by provider, will assess and evaluate patient's response and provide education to patient for prescribed medication. RN will report any adverse and/or side effects to prescribing provider.  Therapeutic Interventions: 1 on 1 counseling sessions, Psychoeducation, Medication administration, Evaluate responses to treatment, Monitor vital signs and CBGs as ordered, Perform/monitor CIWA, COWS, AIMS and Fall Risk screenings as ordered, Perform wound care treatments as ordered.  Evaluation of Outcomes: Progressing   LCSW Treatment Plan for Primary Diagnosis: MDD (major depressive disorder), recurrent severe, without psychosis (HCC) Long Term Goal(s): Safe transition to appropriate next level of care at discharge, Engage patient in therapeutic group addressing interpersonal concerns.  Short Term Goals: Engage patient in aftercare planning with referrals and resources, Increase social support, Increase ability to appropriately verbalize feelings, Increase emotional regulation, Facilitate acceptance of mental health diagnosis and concerns, Facilitate patient progression through stages of change regarding substance use diagnoses and concerns, Identify triggers associated with mental health/substance abuse issues, and Increase skills for wellness and recovery  Therapeutic Interventions: Assess for all discharge needs, 1 to 1 time with Social worker, Explore available resources and support systems, Assess for adequacy in community support network, Educate family and significant other(s) on suicide prevention, Complete Psychosocial Assessment, Interpersonal group therapy.  Evaluation of Outcomes: Progressing   Progress in Treatment: Attending  groups: Yes. and No. Participating in groups: Yes. and No. Taking medication as prescribed: Yes. Toleration medication: Yes. Family/Significant other contact made: No, will contact:  CSW will  contact if given permission  Patient understands diagnosis: Yes. Discussing patient identified problems/goals with staff: Yes. Medical problems stabilized or resolved: Yes. Denies suicidal/homicidal ideation: Yes. Issues/concerns per patient self-inventory: No. Other: None   New problem(s) identified: No, Describe:  none Update 08/15/23 : No changes at this time    New Short Term/Long Term Goal(s): detox, elimination of symptoms of psychosis, medication management for mood stabilization; elimination of SI thoughts; development of comprehensive mental wellness/sobriety plan. Update 08/15/23 : No changes at this time    Patient Goals:  "try to get the right medicine to keep me going and get better" Update 08/15/23 : No changes at this time    Discharge Plan or Barriers: Patient reports that he would like to join a day program or support group in the local area.  He reports plans to return to his home.  CSW team to assist pt in development of appropriate discharge plans.   Update 08/15/23 : No changes at this time    Reason for Continuation of Hospitalization: Anxiety Depression Medication stabilization Suicidal ideation Withdrawal symptoms   Estimated Length of Stay:  1-7 days Update 08/15/23 : No changes at this time   Last 3 [place] Suicide Severity Risk Score: Flowsheet Row Admission (Current) from 08/08/2023 in [facility] Most recent reading at 08/09/2023 12:00 AM ED from 08/08/2023 in [facility] Emergency Department at [facility] Most recent reading at 08/08/2023 12:06 PM ED from 07/15/2023 in [facility] Emergency Department at [facility] Most recent reading at 07/16/2023 12:04 AM  C-SSRS RISK CATEGORY High Risk High Risk Moderate Risk       Last PHQ 2/9 Scores:    10/19/2022    2:53 PM 05/11/2022    2:30 PM 03/31/2022    2:15 PM  Depression screen PHQ 2/9  Decreased Interest 0 1 0  Down, Depressed, Hopeless 0 1 0  PHQ - 2 Score 0 2 0   Altered sleeping  0   Tired, decreased energy  1   Change in appetite  1   Trouble concentrating  1   Moving slowly or fidgety/restless  0   Suicidal thoughts  0   PHQ-9 Score  5     Scribe for Treatment Team: Elza Rafter, [place] 08/15/2023 2:19 PM

## 2023-08-16 DIAGNOSIS — F332 Major depressive disorder, recurrent severe without psychotic features: Secondary | ICD-10-CM | POA: Diagnosis not present

## 2023-08-16 MED ORDER — INFLUENZA VAC A&B SURF ANT ADJ 0.5 ML IM SUSY
0.5000 mL | PREFILLED_SYRINGE | INTRAMUSCULAR | Status: AC
  Administered 2023-08-17: 0.5 mL via INTRAMUSCULAR
  Filled 2023-08-16 (×2): qty 0.5

## 2023-08-16 NOTE — Progress Notes (Signed)
Patient request flu vaccine. Order placed for tomorrow.

## 2023-08-16 NOTE — Group Note (Signed)
Date:  08/16/2023 Time:  9:34 PM  Group Topic/Focus:  Building Self Esteem:   The Focus of this group is helping patients become aware of the effects of self-esteem on their lives, the things they and others do that enhance or undermine their self-esteem, seeing the relationship between their level of self-esteem and the choices they make and learning ways to enhance self-esteem.    Participation Level:  Active  Participation Quality:  Appropriate and Attentive  Affect:  Appropriate  Cognitive:  Alert and Appropriate  Insight: Appropriate and Good  Engagement in Group:  Engaged  Modes of Intervention:  Support  Additional Comments:    Jeannette How 08/16/2023, 9:34 PM

## 2023-08-16 NOTE — Plan of Care (Signed)

## 2023-08-16 NOTE — Group Note (Signed)
Recreation Therapy Group Note   Group Topic:Relaxation  Group Date: 08/16/2023 Start Time: 1400 End Time: 1445 Facilitators: Rosina Lowenstein, LRT, CTRS Location:  Day Room  Group Description: Meditation. LRT and patients discussed what they know about meditation and mindfulness. LRT played a Deep Breathing Meditation exercise script for patients to follow along to. LRT and patients discussed how meditation and deep breathing can be used as a coping skill post--discharge.   Goal Area(s) Addressed: Patient will practice using relaxation technique. Patient will identify a new coping skill.  Patient will follow multistep directions to reduce anxiety and stress.   Affect/Mood: Appropriate   Participation Level: Active and Engaged   Participation Quality: Independent   Behavior: Appropriate, Calm, and Cooperative   Speech/Thought Process: Coherent   Insight: Good   Judgement: Good   Modes of Intervention: Activity   Patient Response to Interventions:  Attentive and Receptive   Education Outcome:  Acknowledges education   Clinical Observations/Individualized Feedback: Robert Chan was active in their participation of session activities and group discussion. Pt identified "stuff like this helps a little bit". Pt interacted well with LRT and peers duration of session.   Plan: Continue to engage patient in RT group sessions 2-3x/week.   Rosina Lowenstein, LRT, CTRS 08/16/2023 2:53 PM

## 2023-08-16 NOTE — Progress Notes (Signed)
Pt. Observed sitting in day room interacting with peers appropriately, watching tv. Pt. Participated in shift assessment, denies SI/HI/AVH/Anxiety and pain. Pt. Received PRN Trazodone for sleep, medication effective. No acute distress noted, routine safety observations to continue.

## 2023-08-16 NOTE — Plan of Care (Signed)

## 2023-08-16 NOTE — Group Note (Signed)
Date:  08/16/2023 Time:  10:42 AM  Group Topic/Focus:  Making Healthy Choices:   The focus of this group is to help patients identify negative/unhealthy choices they were using prior to admission and identify positive/healthier coping strategies to replace them upon discharge.    Participation Level:  Active  Participation Quality:  Attentive  Affect:  Appropriate  Cognitive:  Appropriate  Insight: Appropriate  Engagement in Group:  Engaged  Modes of Intervention:  Education  Additional Comments:   [name] 08/16/2023, 10:42 AM

## 2023-08-16 NOTE — Group Note (Signed)
LCSW Group Therapy Note  Group Date: 08/16/2023 Start Time: 1335 End Time: 1405   Type of Therapy and Topic:  Group Therapy - Healthy vs Unhealthy Coping Skills  Participation Level:  Active   Description of Group The focus of this group was to determine what unhealthy coping techniques typically are used by group members and what healthy coping techniques would be helpful in coping with various problems. Patients were guided in becoming aware of the differences between healthy and unhealthy coping techniques. Patients were asked to identify 2-3 healthy coping skills they would like to learn to use more effectively.  Therapeutic Goals Patients learned that coping is what human beings do all day long to deal with various situations in their lives Patients defined and discussed healthy vs unhealthy coping techniques Patients identified their preferred coping techniques and identified whether these were healthy or unhealthy Patients determined 2-3 healthy coping skills they would like to become more familiar with and use more often. Patients provided support and ideas to each other   Summary of Patient Progress:  During group, Ladon expressed positive thoughts as a method of coping. Patient proved open to input from peers and feedback from CSW. Patient demonstrated significant insight into the subject matter, was respectful of peers, and participated throughout the entire session.   Therapeutic Modalities Cognitive Behavioral Therapy Motivational Interviewing  Whitney Post, Theresia Majors 08/16/2023  3:49 PM

## 2023-08-16 NOTE — Progress Notes (Signed)
   08/16/23 0725  Psych Admission Type (Psych Patients Only)  Admission Status Voluntary  Psychosocial Assessment  Patient Complaints None  Eye Contact Fair  Facial Expression Animated  Affect Sullen  Speech Logical/coherent  Interaction Guarded  Motor Activity Other (Comment) (WNL)  Appearance/Hygiene Unremarkable  Behavior Characteristics Cooperative  Mood Pleasant  Thought Process  Coherency WDL  Content WDL  Delusions None reported or observed  Perception WDL  Hallucination None reported or observed  Judgment WDL  Confusion None  Danger to Self  Current suicidal ideation? Denies  Danger to Others  Danger to Others None reported or observed

## 2023-08-16 NOTE — Progress Notes (Signed)
[facility] MD Progress Note  08/16/2023  Robert Chan  MRN:  [id number]   Robert Chan is a 67 year old African-American male who was voluntarily admitted to geriatric psychiatry for worsening depression and SI.    Subjective: Chart reviewed, case discussed in multidisciplinary meeting, patient seen during rounds today..  Patient continues to report depressed mood with suicide thoughts, denies any intention to harm himself on the unit.  Patient shared that he has lost few friends to suicide.  Patient said" I am not going to give up, I want to try and keep going".  Patient was provided with support and reassurance.  Patient was encouraged to attend group and work on coping strategies.  Patient reports that his sleep and appetite have improved.  He denies psychotic or manic symptoms.   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   Depression    Elevated serum creatinine 08/28/2019   Homelessness 06/19/2020   Homicidal ideations 05/25/2023   Hypertension    Major depressive disorder, recurrent episode, 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/11/2022   Major depressive disorder, single episode, 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/10/2022   MDD (major depressive disorder), recurrent severe, 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   NSTEMI (non-ST elevated myocardial infarction) (HCC)    Schizoaffective disorder (HCC) 11/08/2022   Status post incision and drainage  04/22/2021   Suicidal ideation 06/05/2020   Suicidal ideation 06/05/2020    Past Surgical History:  Procedure Laterality Date   APPENDECTOMY     EYE SURGERY     LEFT HEART CATH AND CORONARY ANGIOGRAPHY N/A 06/05/2020   Procedure: LEFT HEART CATH AND CORONARY ANGIOGRAPHY;  Surgeon: Elder Negus, MD;  Location: [facility] INVASIVE CV LAB;  Service: Cardiovascular;  Laterality: N/A;   PROSTATE SURGERY     Family History:  Family History  Problem Relation Age of Onset   Hypertension Mother    Diabetes Mother    Hypertension Father    Hypertension Sister     Social History:  Social History   Substance and Sexual Activity  Alcohol Use Yes   Comment: 4 40oz beer a week     Social History   Substance and Sexual Activity  Drug Use Not Currently   Types: Cocaine   Comment: cocaine use once a month, last use 6 mos ago    Social History   Socioeconomic History   Marital status: Single    Spouse name: Not on file   Number of children: Not on file   Years of education: Not on file   Highest education level: Not on file  Occupational History   Not on file  Tobacco Use   Smoking status: Some Days    Current packs/day: 0.00    Types: Cigarettes    Last attempt to quit: 12/25/2020    Years since quitting: 2.6   Smokeless tobacco: Never   Tobacco comments:    Smokes a couple cigarettes every now and then  Vaping Use   Vaping  status: Never Used  Substance and Sexual Activity   Alcohol use: Yes    Comment: 4 40oz beer a week   Drug use: Not Currently    Types: Cocaine    Comment: cocaine use once a month, last use 6 mos ago   Sexual activity: Not Currently  Other Topics Concern   Not on file  Social History Narrative   His mother passed at age 75 (when he was 42 years old) and his father raised him and his siblings   His parents were pastors as a lot of his sisters and other family members are today   There was a total of 13 children in his family Had 48 sisters, one sister  deceased , Had 3 brothers, all brothers deceased   He is the youngest of the 39 and was "always in trouble in my younger years"   Family still live in [place], [place], some in [place], [place]   Has a Daughter and son with a total of 10 grandchildren (6 grand daughters local and 4 grandsons)    Values family   Married twice, present wife incarcerated   Social Determinants of Health   Financial Resource Strain: Low Risk  (10/19/2022)   Overall Financial Resource Strain (CARDIA)    Difficulty of Paying Living Expenses: Not hard at all  Food Insecurity: No Food Insecurity (08/09/2023)   Hunger Vital Sign    Worried About Running Out of Food in the Last Year: Never true    Ran Out of Food in the Last Year: Never true  Transportation Needs: Unmet Transportation Needs (08/09/2023)   PRAPARE - Transportation    Lack of Transportation (Medical): Yes    Lack of Transportation (Non-Medical): Yes  Physical Activity: Inactive (10/19/2022)   Exercise Vital Sign    Days of Exercise per Week: 0 days    Minutes of Exercise per Session: 0 min  Stress: No Stress Concern Present (10/19/2022)   [name] of Occupational Health - Occupational Stress Questionnaire    Feeling of Stress : Not at all  Social Connections: Moderately Integrated (03/31/2022)   Social Connection and Isolation Panel [NHANES]    Frequency of Communication with Friends and Family: Three times a week    Frequency of Social Gatherings with Friends and Family: Three times a week    Attends Religious Services: 1 to 4 times per year    Active Member of Clubs or Organizations: Yes    Attends Banker Meetings: 1 to 4 times per year    Marital Status: Never married   Additional Social History:                         Sleep: Fair  Appetite:  Good  Current Medications: Current Facility-Administered Medications  Medication Dose Route Frequency Provider Last Rate Last Admin   acetaminophen (TYLENOL)  tablet 650 mg  650 mg Oral Q6H PRN Motley-Mangrum, Jadeka A, PMHNP   650 mg at 08/14/23 1508   albuterol (VENTOLIN HFA) 108 (90 Base) MCG/ACT inhaler 2 puff  2 puff Inhalation Q6H PRN Motley-Mangrum, Jadeka A, PMHNP       alum & mag hydroxide-simeth (MAALOX/MYLANTA) 200-200-20 MG/5ML suspension 30 mL  30 mL Oral Q4H PRN Motley-Mangrum, Jadeka A, PMHNP       ARIPiprazole (ABILIFY) tablet 5 mg  5 mg Oral Daily [name], DO   5 mg at 08/16/23 0919   atorvastatin (LIPITOR) tablet 80 mg  80 mg Oral Daily [name], DO   80 mg at 08/16/23 0919   diltiazem (CARDIZEM CD) 24 hr capsule 180 mg  180 mg Oral Daily [name], DO   180 mg at 08/16/23 0919   diphenhydrAMINE (BENADRYL) capsule 50 mg  50 mg Oral TID PRN Motley-Mangrum, Geralynn Ochs A, PMHNP       Or   diphenhydrAMINE (BENADRYL) injection 50 mg  50 mg Intramuscular TID PRN Motley-Mangrum, Geralynn Ochs A, PMHNP       doxepin (SINEQUAN) capsule 50 mg  50 mg Oral QHS [name], DO   50 mg at 08/15/23 2153   guaiFENesin (MUCINEX) 12 hr tablet 600 mg  600 mg Oral Q6H PRN Jearld Lesch, NP   600 mg at 08/13/23 0737   haloperidol (HALDOL) tablet 5 mg  5 mg Oral TID PRN Motley-Mangrum, Geralynn Ochs A, PMHNP       Or   haloperidol lactate (HALDOL) injection 5 mg  5 mg Intramuscular TID PRN Motley-Mangrum, Jadeka A, PMHNP       hydrOXYzine (ATARAX) tablet 50 mg  50 mg Oral TID PRN [name], DO   50 mg at 08/14/23 2109   [START ON 08/17/2023] influenza vaccine adjuvanted (FLUAD) injection 0.5 mL  0.5 mL Intramuscular Tomorrow-1000 Lewanda Rife, MD       latanoprost (XALATAN) 0.005 % ophthalmic solution 1 drop  1 drop Both Eyes QHS [name], DO   1 drop at 08/15/23 2154   magnesium hydroxide (MILK OF MAGNESIA) suspension 30 mL  30 mL Oral Daily PRN Motley-Mangrum, Jadeka A, PMHNP       mometasone-formoterol (DULERA) 200-5 MCG/ACT inhaler 2 puff  2 puff Inhalation BID Motley-Mangrum,  Jadeka A, PMHNP   2 puff at 08/16/23 0919   polyethylene glycol (MIRALAX / GLYCOLAX) packet 17 g  17 g Oral Daily [name], DO   17 g at 08/16/23 0919   traZODone (DESYREL) tablet 50 mg  50 mg Oral QHS PRN [name], DO   50 mg at 08/15/23 2153    Lab Results: No results found for this or any previous visit (from the past 48 hour(s)).  Blood Alcohol level:  Lab Results  Component Value Date   ETH <10 08/08/2023   ETH <10 07/15/2023    Metabolic Disorder Labs: Lab Results  Component Value Date   HGBA1C 5.2 01/13/2023   MPG 103 01/13/2023   MPG 91.06 09/07/2020   No results found for: "PROLACTIN" Lab Results  Component Value Date   CHOL 168 01/13/2023   TRIG 62 01/13/2023   HDL 57 01/13/2023   CHOLHDL 2.9 01/13/2023   VLDL 12 01/13/2023   LDLCALC 99 01/13/2023   LDLCALC 104 (H) 05/17/2022    Physical Findings: AIMS:  , ,  ,  ,    CIWA:    COWS:     Musculoskeletal: Strength & Muscle Tone: within normal limits Gait & Station: normal Patient leans: N/A  Psychiatric Specialty Exam:  Presentation  General Appearance:  Appropriate for Environment  Eye Contact: Good  Speech: Clear and Coherent  Speech Volume: Normal  Handedness: Right   Mood and Affect  Mood: Depressed  Affect: Constricted   Thought Process  Thought Processes: Coherent  Descriptions of Associations:Intact  Orientation:Full (Time, Place and Person)  Thought Content:WDL  History of Schizophrenia/Schizoaffective disorder:Yes  Duration of Psychotic Symptoms:Greater than six months  Hallucinations:Denies Ideas of Reference:None  Suicidal Thoughts:Positive Homicidal Thoughts:Denies  Sensorium  Memory:  Recent Fair; Immediate Good  Judgment: Limited  Insight: Corporate treasurer: Fair  Attention Span: Fair  Recall: Jennelle Human of Knowledge: Fair  Language: Fair   Psychomotor Activity  Psychomotor  Activity:Decreased  Assets  Assets: Manufacturing systems engineer; Desire for Improvement; Housing; Social Support   Sleep  Sleep:Improved   Physical Exam: Physical Exam Constitutional:      Appearance: Normal appearance.  HENT:     Head: Normocephalic and atraumatic.     Nose: Nose normal.  Eyes:     Pupils: Pupils are equal, round, and reactive to light.  Cardiovascular:     Rate and Rhythm: Regular rhythm.     Pulses: Normal pulses.  Pulmonary:     Effort: Pulmonary effort is normal.  Skin:    General: Skin is warm.  Neurological:     General: No focal deficit present.     Mental Status: He is alert and oriented to person, place, and time.    Review of Systems  Constitutional:  Negative for chills and fever.  HENT:  Negative for hearing loss, sinus pain and sore throat.   Eyes:  Negative for blurred vision and double vision.  Respiratory:  Negative for cough and shortness of breath.   Cardiovascular:  Negative for chest pain and palpitations.  Gastrointestinal:  Negative for nausea and vomiting.  Neurological:  Negative for dizziness and speech change.  Psychiatric/Behavioral:  Positive for depression and suicidal ideas. The patient is nervous/anxious.    Blood pressure 134/65, pulse 75, temperature 98.4 F (36.9 C), resp. rate 16, height 6\' 4"  (1.93 m), weight 97.8 kg, SpO2 98%. Body mass index is 26.23 kg/m.   Treatment Plan Summary: Daily contact with patient to assess and evaluate symptoms and progress in treatment and Medication management  Patient is admitted to locked unit under safety precaution Continue on doxepin 50 mg and Abilify 5 mg to target depressive symptoms Patient encouraged to attend group and work on coping strategies Social worker consulted to help with a safe discharge plan   Lewanda Rife, MD

## 2023-08-17 DIAGNOSIS — F332 Major depressive disorder, recurrent severe without psychotic features: Secondary | ICD-10-CM | POA: Diagnosis not present

## 2023-08-17 NOTE — Progress Notes (Signed)
   08/17/23 2200  Psych Admission Type (Psych Patients Only)  Admission Status Voluntary  Psychosocial Assessment  Patient Complaints None  Eye Contact Fair  Facial Expression Animated  Affect Appropriate to circumstance  Speech Logical/coherent  Interaction Assertive  Motor Activity Other (Comment) (WNL)  Appearance/Hygiene In scrubs  Behavior Characteristics Cooperative  Mood Pleasant  Thought Process  Coherency WDL  Content WDL  Delusions None reported or observed  Perception WDL  Hallucination None reported or observed  Judgment WDL  Confusion None  Danger to Self  Current suicidal ideation? Denies  Agreement Not to Harm Self Yes  Description of Agreement verbal  Danger to Others  Danger to Others None reported or observed

## 2023-08-17 NOTE — Group Note (Signed)
Recreation Therapy Group Note   Group Topic:Goal Setting  Group Date: 08/17/2023 Start Time: 1400 End Time: 1500 Facilitators: Rosina Lowenstein, LRT, CTRS Location:  Dayroom  Group Description: Scientist, research (physical sciences). Patients were given many different magazines, a glue stick, markers, and a piece of cardstock paper. LRT and pts discussed the importance of having goals in life. LRT and pts discussed the difference between short-term and long-term goals, as well as what a SMART goal is. LRT encouraged pts to create a vision board, with images they picked and then cut out with safety scissors from the magazine, for themselves, that capture their short and long-term goals. LRT encouraged pts to show and explain their vision board to the group.   Goal Area(s) Addressed:  Patient will gain knowledge of short vs. long term goals.  Patient will identify goals for themselves. Patient will practice setting SMART goals. Patient will verbalize their goals to LRT and peers.   Affect/Mood: Appropriate   Participation Level: Active and Engaged   Participation Quality: Independent   Behavior: Calm and Cooperative   Speech/Thought Process: Coherent   Insight: Good   Judgement: Good   Modes of Intervention: Art   Patient Response to Interventions:  Attentive, Engaged, Interested , and Receptive   Education Outcome:  Acknowledges education   Clinical Observations/Individualized Feedback: Demontrez was active in their participation of session activities and group discussion. Pt identified "buy some land for all my grandkids since theres a lot" as his goal. Pt interacted well with LRT and peers duration of session.   Plan: Continue to engage patient in RT group sessions 2-3x/week.   Rosina Lowenstein, LRT, CTRS 08/17/2023 3:29 PM

## 2023-08-17 NOTE — Group Note (Signed)
Date:  08/17/2023 Time:  5:54 PM  Group Topic/Focus:  Making Healthy Choices:   The focus of this group is to help patients identify negative/unhealthy choices they were using prior to admission and identify positive/healthier coping strategies to replace them upon discharge.    Participation Level:  Active  Participation Quality:  Appropriate  Affect:  Appropriate  Cognitive:  Appropriate  Insight: Appropriate  Engagement in Group:  Engaged  Modes of Intervention:  Education  Additional Comments:    [name] 08/17/2023, 5:54 PM

## 2023-08-17 NOTE — Progress Notes (Signed)
Patient observed sitting in day room, interacting with staff and peers appropriately. Patient participated in shift assessment, denies SI/HI/AVH/Pain and Anxiety. Patient med compliant, received PRN Trazodone 50 mg PO for insomnia. Pt. participated in group, ate a snack and went to be without incident. No acute distress noted. VSS, routine safety obs to continue.

## 2023-08-17 NOTE — Progress Notes (Signed)
[facility] MD Progress Note  08/17/2023  Robert Chan  MRN:  [id number]   Robert Chan is a 67 year old African-American male who was voluntarily admitted to geriatric psychiatry for worsening depression and SI.    Subjective: Chart reviewed, case discussed in multidisciplinary meeting, patient seen during rounds today.  Patient continues to report depressed mood with suicide thoughts, denies any intention to harm himself on the unit.  Patient was provided with support and reassurance.  Patient has been attending groups on the unit, he was encouraged to work on coping strategies.  Patient reports that his sleep and appetite have improved.  He denies psychotic or manic symptoms.   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   Depression    Elevated serum creatinine 08/28/2019   Homelessness 06/19/2020   Homicidal ideations 05/25/2023   Hypertension    Major depressive disorder, recurrent episode, 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/11/2022   Major depressive disorder, single episode, 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/10/2022   MDD (major depressive disorder), recurrent severe, 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   NSTEMI (non-ST elevated myocardial infarction) (HCC)    Schizoaffective disorder (HCC) 11/08/2022   Status post incision and drainage 04/22/2021   Suicidal ideation 06/05/2020   Suicidal ideation 06/05/2020    Past Surgical History:   Procedure Laterality Date   APPENDECTOMY     EYE SURGERY     LEFT HEART CATH AND CORONARY ANGIOGRAPHY N/A 06/05/2020   Procedure: LEFT HEART CATH AND CORONARY ANGIOGRAPHY;  Surgeon: Elder Negus, MD;  Location: [facility] INVASIVE CV LAB;  Service: Cardiovascular;  Laterality: N/A;   PROSTATE SURGERY     Family History:  Family History  Problem Relation Age of Onset   Hypertension Mother    Diabetes Mother    Hypertension Father    Hypertension Sister     Social History:  Social History   Substance and Sexual Activity  Alcohol Use Yes   Comment: 4 40oz beer a week     Social History   Substance and Sexual Activity  Drug Use Not Currently   Types: Cocaine   Comment: cocaine use once a month, last use 6 mos ago    Social History   Socioeconomic History   Marital status: Single    Spouse name: Not on file   Number of children: Not on file   Years of education: Not on file   Highest education level: Not on file  Occupational History   Not on file  Tobacco Use   Smoking status: Some Days    Current packs/day: 0.00    Types: Cigarettes    Last attempt to quit: 12/25/2020    Years since quitting: 2.6   Smokeless tobacco: Never   Tobacco comments:    Smokes a couple cigarettes every now and then  Vaping Use   Vaping status: Never Used  Substance and Sexual Activity   Alcohol use: Yes    Comment: 4 40oz beer a week  Drug use: Not Currently    Types: Cocaine    Comment: cocaine use once a month, last use 6 mos ago   Sexual activity: Not Currently  Other Topics Concern   Not on file  Social History Narrative   His mother passed at age 36 (when he was 67 years old) and his father raised him and his siblings   His parents were pastors as a lot of his sisters and other family members are today   There was a total of 13 children in his family Had 15 sisters, one sister deceased , Had 3 brothers, all brothers deceased   He is the youngest of the 82 and was "always in  trouble in my younger years"   Family still live in [place], [place], some in [place], [place]   Has a Daughter and son with a total of 10 grandchildren (6 grand daughters local and 4 grandsons)    Values family   Married twice, present wife incarcerated   Social Determinants of Health   Financial Resource Strain: Low Risk  (10/19/2022)   Overall Financial Resource Strain (CARDIA)    Difficulty of Paying Living Expenses: Not hard at all  Food Insecurity: No Food Insecurity (08/09/2023)   Hunger Vital Sign    Worried About Running Out of Food in the Last Year: Never true    Ran Out of Food in the Last Year: Never true  Transportation Needs: Unmet Transportation Needs (08/09/2023)   PRAPARE - Transportation    Lack of Transportation (Medical): Yes    Lack of Transportation (Non-Medical): Yes  Physical Activity: Inactive (10/19/2022)   Exercise Vital Sign    Days of Exercise per Week: 0 days    Minutes of Exercise per Session: 0 min  Stress: No Stress Concern Present (10/19/2022)   [name] of Occupational Health - Occupational Stress Questionnaire    Feeling of Stress : Not at all  Social Connections: Moderately Integrated (03/31/2022)   Social Connection and Isolation Panel [NHANES]    Frequency of Communication with Friends and Family: Three times a week    Frequency of Social Gatherings with Friends and Family: Three times a week    Attends Religious Services: 1 to 4 times per year    Active Member of Clubs or Organizations: Yes    Attends Banker Meetings: 1 to 4 times per year    Marital Status: Never married   Additional Social History:                         Sleep: Fair  Appetite:  Good  Current Medications: Current Facility-Administered Medications  Medication Dose Route Frequency Provider Last Rate Last Admin   acetaminophen (TYLENOL) tablet 650 mg  650 mg Oral Q6H PRN Motley-Mangrum, Jadeka A, PMHNP   650 mg at 08/14/23 1508    albuterol (VENTOLIN HFA) 108 (90 Base) MCG/ACT inhaler 2 puff  2 puff Inhalation Q6H PRN Motley-Mangrum, Jadeka A, PMHNP       alum & mag hydroxide-simeth (MAALOX/MYLANTA) 200-200-20 MG/5ML suspension 30 mL  30 mL Oral Q4H PRN Motley-Mangrum, Jadeka A, PMHNP       ARIPiprazole (ABILIFY) tablet 5 mg  5 mg Oral Daily [name], DO   5 mg at 08/17/23 0911   atorvastatin (LIPITOR) tablet 80 mg  80 mg Oral Daily [name], DO   80 mg at 08/17/23 0911   diltiazem (CARDIZEM CD) 24 hr capsule  180 mg  180 mg Oral Daily [name], DO   180 mg at 08/17/23 1610   diphenhydrAMINE (BENADRYL) capsule 50 mg  50 mg Oral TID PRN Motley-Mangrum, Geralynn Ochs A, PMHNP       Or   diphenhydrAMINE (BENADRYL) injection 50 mg  50 mg Intramuscular TID PRN Motley-Mangrum, Jadeka A, PMHNP       doxepin (SINEQUAN) capsule 50 mg  50 mg Oral QHS [name], DO   50 mg at 08/16/23 2111   guaiFENesin (MUCINEX) 12 hr tablet 600 mg  600 mg Oral Q6H PRN Jearld Lesch, NP   600 mg at 08/13/23 0737   haloperidol (HALDOL) tablet 5 mg  5 mg Oral TID PRN Motley-Mangrum, Geralynn Ochs A, PMHNP       Or   haloperidol lactate (HALDOL) injection 5 mg  5 mg Intramuscular TID PRN Motley-Mangrum, Jadeka A, PMHNP       hydrOXYzine (ATARAX) tablet 50 mg  50 mg Oral TID PRN [name], DO   50 mg at 08/14/23 2109   latanoprost (XALATAN) 0.005 % ophthalmic solution 1 drop  1 drop Both Eyes QHS [name], DO   1 drop at 08/16/23 2112   magnesium hydroxide (MILK OF MAGNESIA) suspension 30 mL  30 mL Oral Daily PRN Motley-Mangrum, Jadeka A, PMHNP       mometasone-formoterol (DULERA) 200-5 MCG/ACT inhaler 2 puff  2 puff Inhalation BID Motley-Mangrum, Jadeka A, PMHNP   2 puff at 08/17/23 0912   polyethylene glycol (MIRALAX / GLYCOLAX) packet 17 g  17 g Oral Daily [name], DO   17 g at 08/17/23 1212   traZODone (DESYREL) tablet 50 mg  50 mg Oral QHS PRN [name], DO   50 mg at 08/16/23 2112    Lab Results: No results found for this or any previous visit (from the past 48 hour(s)).  Blood Alcohol level:  Lab Results  Component Value Date   ETH <10 08/08/2023   ETH <10 07/15/2023    Metabolic Disorder Labs: Lab Results  Component Value Date   HGBA1C 5.2 01/13/2023   MPG 103 01/13/2023   MPG 91.06 09/07/2020   No results found for: "PROLACTIN" Lab Results  Component Value Date   CHOL 168 01/13/2023   TRIG 62 01/13/2023   HDL 57 01/13/2023   CHOLHDL 2.9 01/13/2023   VLDL 12 01/13/2023   LDLCALC 99 01/13/2023   LDLCALC 104 (H) 05/17/2022    Physical Findings: AIMS:  , ,  ,  ,    CIWA:    COWS:     Musculoskeletal: Strength & Muscle Tone: within normal limits Gait & Station: normal Patient leans: N/A  Psychiatric Specialty Exam:  Presentation  General Appearance:  Appropriate for Environment  Eye Contact: Good  Speech: Clear and Coherent  Speech Volume: Normal  Handedness: Right   Mood and Affect  Mood: Depressed  Affect: Constricted   Thought Process  Thought Processes: Coherent  Descriptions of Associations:Intact  Orientation:Full (Time, Place and Person)  Thought Content:WDL  History of Schizophrenia/Schizoaffective disorder:Yes  Duration of Psychotic Symptoms:Greater than six months  Hallucinations:Denies Ideas of Reference:None  Suicidal Thoughts:Positive Homicidal Thoughts:Denies  Sensorium  Memory: Recent Fair; Immediate Good  Judgment: Limited  Insight: Fair   Chartered certified accountant: Fair  Attention Span: Fair  Recall: [employer] of Knowledge: Fair  Language: Fair   Psychomotor Activity  Psychomotor Activity:Decreased  Assets  Assets: Manufacturing systems engineer; Desire for  Improvement; Housing; Social Support   Sleep  Sleep:Improved   Physical Exam: Physical Exam Constitutional:      Appearance: Normal appearance.  HENT:      Head: Normocephalic and atraumatic.     Nose: Nose normal.  Eyes:     Pupils: Pupils are equal, round, and reactive to light.  Cardiovascular:     Rate and Rhythm: Regular rhythm.     Pulses: Normal pulses.  Pulmonary:     Effort: Pulmonary effort is normal.  Skin:    General: Skin is warm.  Neurological:     General: No focal deficit present.     Mental Status: He is alert and oriented to person, place, and time.    Review of Systems  Constitutional:  Negative for chills and fever.  HENT:  Negative for hearing loss, sinus pain and sore throat.   Eyes:  Negative for blurred vision and double vision.  Respiratory:  Negative for cough and shortness of breath.   Cardiovascular:  Negative for chest pain and palpitations.  Gastrointestinal:  Negative for nausea and vomiting.  Neurological:  Negative for dizziness and speech change.  Psychiatric/Behavioral:  Positive for depression and suicidal ideas. The patient is nervous/anxious.    Blood pressure 128/79, pulse 67, temperature 97.9 F (36.6 C), resp. rate 18, height 6\' 4"  (1.93 m), weight 97.8 kg, SpO2 99%. Body mass index is 26.23 kg/m.   Treatment Plan Summary: Daily contact with patient to assess and evaluate symptoms and progress in treatment and Medication management  Patient is admitted to locked unit under safety precaution Continue on doxepin 50 mg and Abilify 5 mg to target depressive symptoms Patient encouraged to attend group and work on coping strategies Social worker consulted to help with a safe discharge plan   Lewanda Rife, MD

## 2023-08-17 NOTE — Progress Notes (Signed)
   08/17/23 1131  Psych Admission Type (Psych Patients Only)  Admission Status Voluntary  Psychosocial Assessment  Patient Complaints None  Eye Contact Fair  Facial Expression Worried  Affect Flat  Speech Logical/coherent  Interaction Minimal;Assertive  Motor Activity Slow  Appearance/Hygiene In scrubs  Behavior Characteristics Cooperative  Mood Pleasant  Thought Process  Coherency WDL  Content WDL  Delusions None reported or observed  Perception WDL  Hallucination None reported or observed  Judgment WDL  Confusion WDL  Danger to Self  Current suicidal ideation? Denies  Danger to Others  Danger to Others None reported or observed   Received influenza vaccine today and in left deltoid and tolerated it well.

## 2023-08-17 NOTE — Plan of Care (Signed)

## 2023-08-18 DIAGNOSIS — F332 Major depressive disorder, recurrent severe without psychotic features: Secondary | ICD-10-CM | POA: Diagnosis not present

## 2023-08-18 NOTE — Group Note (Signed)
Date:  08/18/2023 Time:  4:46 AM  Group Topic/Focus:  Coping With Mental Health Crisis:   The purpose of this group is to help patients identify strategies for coping with mental health crisis.  Group discusses possible causes of crisis and ways to manage them effectively. Wrap-Up Group:   The focus of this group is to help patients review their daily goal of treatment and discuss progress on daily workbooks.    Participation Level:  Active  Participation Quality:  Appropriate  Affect:  Appropriate  Cognitive:  Appropriate  Insight: Appropriate  Engagement in Group:  Engaged  Modes of Intervention:  Discussion  Additional Comments:    Osker Mason 08/18/2023, 4:46 AM

## 2023-08-18 NOTE — Plan of Care (Signed)
D: Pt alert and oriented. Pt denies experiencing any anxiety/depression at this time. Pt denies experiencing any pain at this time. Pt denies experiencing any SI/HI, or AVH at this time.   A: Scheduled medications administered to pt, per MD orders. Support and encouragement provided. Frequent verbal contact made. Routine safety checks conducted q15 minutes.   R: No adverse drug reactions noted. Pt verbally contracts for safety at this time. Pt compliant with medications and treatment plan. Pt interacts well with others on the unit. Pt remains safe at this time. Plan of care ongoing.  Pt has attended groups today.  Problem: Education: Goal: Knowledge of General Education information will improve Description: Including pain rating scale, medication(s)/side effects and non-pharmacologic comfort measures Outcome: Progressing   Problem: Coping: Goal: Level of anxiety will decrease Outcome: Progressing

## 2023-08-18 NOTE — Progress Notes (Signed)
   08/18/23 0600  15 Minute Checks  Location Bedroom  Visual Appearance Calm  Behavior Sleeping  Sleep (Behavioral Health Patients Only)  Calculate sleep? (Click Yes once per 24 hr at 0600 safety check) Yes  Documented sleep last 24 hours 8.25

## 2023-08-18 NOTE — Progress Notes (Signed)
[facility] MD Progress Note  08/18/2023  Robert Chan  MRN:  [id number]   Robert Chan is a 67 year old African-American male who was voluntarily admitted to geriatric psychiatry for worsening depression and SI.    Subjective: Chart reviewed, case discussed in multidisciplinary meeting, patient seen during rounds today.  Patient reports he does not feel at his baseline yet.  Patient said he needs few more days, he wants to make sure he gets the help he needs before his discharge.  Patient continues to report depressed mood with suicide thoughts, denies any intention to harm himself on the unit.  Patient was provided with support and reassurance.  Patient has been attending groups on the unit, he was encouraged to work on coping strategies.  Patient reports that his sleep and appetite have improved.  He denies psychotic or manic symptoms.   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   Depression    Elevated serum creatinine 08/28/2019   Homelessness 06/19/2020   Homicidal ideations 05/25/2023   Hypertension    Major depressive disorder, recurrent episode, 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/11/2022   Major depressive disorder, single episode, 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/10/2022   MDD (major depressive disorder), recurrent severe, 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   NSTEMI (non-ST elevated myocardial infarction) (HCC)    Schizoaffective  disorder (HCC) 11/08/2022   Status post incision and drainage 04/22/2021   Suicidal ideation 06/05/2020   Suicidal ideation 06/05/2020    Past Surgical History:  Procedure Laterality Date   APPENDECTOMY     EYE SURGERY     LEFT HEART CATH AND CORONARY ANGIOGRAPHY N/A 06/05/2020   Procedure: LEFT HEART CATH AND CORONARY ANGIOGRAPHY;  Surgeon: Elder Negus, MD;  Location: [facility];  Service: Cardiovascular;  Laterality: N/A;   PROSTATE SURGERY     Family History:  Family History  Problem Relation Age of Onset   Hypertension Mother    Diabetes Mother    Hypertension Father    Hypertension Sister     Social History:  Social History   Substance and Sexual Activity  Alcohol Use Yes   Comment: 4 40oz beer a week     Social History   Substance and Sexual Activity  Drug Use Not Currently   Types: Cocaine   Comment: cocaine use once a month, last use 6 mos ago    Social History   Socioeconomic History   Marital status: Single    Spouse name: Not on file   Number of children: Not on file   Years of education: Not on file   Highest education level: Not on file  Occupational History   Not on file  Tobacco Use   Smoking status: Some Days    Current packs/day: 0.00    Types: Cigarettes    Last attempt to quit: 12/25/2020    Years since quitting: 2.6   Smokeless tobacco: Never   Tobacco comments:    Smokes a couple cigarettes  every now and then  Vaping Use   Vaping status: Never Used  Substance and Sexual Activity   Alcohol use: Yes    Comment: 4 40oz beer a week   Drug use: Not Currently    Types: Cocaine    Comment: cocaine use once a month, last use 6 mos ago   Sexual activity: Not Currently  Other Topics Concern   Not on file  Social History Narrative   His mother passed at age 63 (when he was 57 years old) and his father raised him and his siblings   His parents were pastors as a lot of his sisters and other family members are today   There was  a total of 13 children in his family Had 72 sisters, one sister deceased , Had 3 brothers, all brothers deceased   He is the youngest of the 53 and was "always in trouble in my younger years"   Family still live in [place], [place], some in [place], [place]   Has a Daughter and son with a total of 10 grandchildren (6 grand daughters local and 4 grandsons)    Values family   Married twice, present wife incarcerated   Social Determinants of Health   Financial Resource Strain: Low Risk  (10/19/2022)   Overall Financial Resource Strain (CARDIA)    Difficulty of Paying Living Expenses: Not hard at all  Food Insecurity: No Food Insecurity (08/09/2023)   Hunger Vital Sign    Worried About Running Out of Food in the Last Year: Never true    Ran Out of Food in the Last Year: Never true  Transportation Needs: Unmet Transportation Needs (08/09/2023)   PRAPARE - Transportation    Lack of Transportation (Medical): Yes    Lack of Transportation (Non-Medical): Yes  Physical Activity: Inactive (10/19/2022)   Exercise Vital Sign    Days of Exercise per Week: 0 days    Minutes of Exercise per Session: 0 min  Stress: No Stress Concern Present (10/19/2022)   [name] of Occupational Health - Occupational Stress Questionnaire    Feeling of Stress : Not at all  Social Connections: Moderately Integrated (03/31/2022)   Social Connection and Isolation Panel [NHANES]    Frequency of Communication with Friends and Family: Three times a week    Frequency of Social Gatherings with Friends and Family: Three times a week    Attends Religious Services: 1 to 4 times per year    Active Member of Clubs or Organizations: Yes    Attends Banker Meetings: 1 to 4 times per year    Marital Status: Never married   Additional Social History:   Patient is homeless, he is working with Child psychotherapist on housing options                      Sleep: Fair  Appetite:  Good  Current  Medications: Current Facility-Administered Medications  Medication Dose Route Frequency Provider Last Rate Last Admin   acetaminophen (TYLENOL) tablet 650 mg  650 mg Oral Q6H PRN Motley-Mangrum, Jadeka A, PMHNP   650 mg at 08/18/23 1453   albuterol (VENTOLIN HFA) 108 (90 Base) MCG/ACT inhaler 2 puff  2 puff Inhalation Q6H PRN Motley-Mangrum, Jadeka A, PMHNP       alum & mag hydroxide-simeth (MAALOX/MYLANTA) 200-200-20 MG/5ML suspension 30 mL  30 mL Oral Q4H PRN Motley-Mangrum, Jadeka A, PMHNP       ARIPiprazole (ABILIFY) tablet 5 mg  5  mg Oral Daily [name], DO   5 mg at 08/18/23 1610   atorvastatin (LIPITOR) tablet 80 mg  80 mg Oral Daily [name], DO   80 mg at 08/18/23 9604   diltiazem (CARDIZEM CD) 24 hr capsule 180 mg  180 mg Oral Daily [name], DO   180 mg at 08/18/23 5409   diphenhydrAMINE (BENADRYL) capsule 50 mg  50 mg Oral TID PRN Motley-Mangrum, Ezra Sites, PMHNP       Or   diphenhydrAMINE (BENADRYL) injection 50 mg  50 mg Intramuscular TID PRN Motley-Mangrum, Geralynn Ochs A, PMHNP       doxepin (SINEQUAN) capsule 50 mg  50 mg Oral QHS [name], DO   50 mg at 08/17/23 2106   guaiFENesin (MUCINEX) 12 hr tablet 600 mg  600 mg Oral Q6H PRN Jearld Lesch, NP   600 mg at 08/13/23 0737   haloperidol (HALDOL) tablet 5 mg  5 mg Oral TID PRN Motley-Mangrum, Geralynn Ochs A, PMHNP       Or   haloperidol lactate (HALDOL) injection 5 mg  5 mg Intramuscular TID PRN Motley-Mangrum, Jadeka A, PMHNP       hydrOXYzine (ATARAX) tablet 50 mg  50 mg Oral TID PRN [name], DO   50 mg at 08/17/23 2106   latanoprost (XALATAN) 0.005 % ophthalmic solution 1 drop  1 drop Both Eyes QHS [name], DO   1 drop at 08/17/23 2105   magnesium hydroxide (MILK OF MAGNESIA) suspension 30 mL  30 mL Oral Daily PRN Motley-Mangrum, Jadeka A, PMHNP       mometasone-formoterol (DULERA) 200-5 MCG/ACT inhaler 2 puff  2 puff Inhalation BID  Motley-Mangrum, Jadeka A, PMHNP   2 puff at 08/18/23 0938   polyethylene glycol (MIRALAX / GLYCOLAX) packet 17 g  17 g Oral Daily [name], DO   17 g at 08/18/23 8119   traZODone (DESYREL) tablet 50 mg  50 mg Oral QHS PRN [name], DO   50 mg at 08/17/23 2106    Lab Results: No results found for this or any previous visit (from the past 48 hour(s)).  Blood Alcohol level:  Lab Results  Component Value Date   ETH <10 08/08/2023   ETH <10 07/15/2023    Metabolic Disorder Labs: Lab Results  Component Value Date   HGBA1C 5.2 01/13/2023   MPG 103 01/13/2023   MPG 91.06 09/07/2020   No results found for: "PROLACTIN" Lab Results  Component Value Date   CHOL 168 01/13/2023   TRIG 62 01/13/2023   HDL 57 01/13/2023   CHOLHDL 2.9 01/13/2023   VLDL 12 01/13/2023   LDLCALC 99 01/13/2023   LDLCALC 104 (H) 05/17/2022     Musculoskeletal: Strength & Muscle Tone: within normal limits Gait & Station: normal Patient leans: N/A  Psychiatric Specialty Exam:  Presentation  General Appearance:  Appropriate for Environment  Eye Contact: Good  Speech: Clear and Coherent  Speech Volume: Normal  Handedness: Right   Mood and Affect  Mood: Depressed  Affect: Constricted   Thought Process  Thought Processes: Coherent  Descriptions of Associations:Intact  Orientation:Full (Time, Place and Person)  Thought Content:WDL  History of Schizophrenia/Schizoaffective disorder:Yes  Duration of Psychotic Symptoms:Greater than six months  Hallucinations:Denies Ideas of Reference:None  Suicidal Thoughts:Positive Homicidal Thoughts:Denies  Sensorium  Memory: Recent Fair; Immediate Good  Judgment: Limited  Insight: Fair   Chartered certified accountant: Fair  Attention Span: Fair  Recall:  Fair  Fund of Knowledge: Fair  Language: Fair   Psychomotor Activity  Psychomotor Activity:Decreased  Assets   Assets: Manufacturing systems engineer; Desire for Improvement; Housing; Social Support   Sleep  Sleep:Improved   Physical Exam: Physical Exam Constitutional:      Appearance: Normal appearance.  HENT:     Head: Normocephalic and atraumatic.     Nose: Nose normal.  Eyes:     Pupils: Pupils are equal, round, and reactive to light.  Cardiovascular:     Rate and Rhythm: Regular rhythm.     Pulses: Normal pulses.  Pulmonary:     Effort: Pulmonary effort is normal.  Skin:    General: Skin is warm.  Neurological:     General: No focal deficit present.     Mental Status: He is alert and oriented to person, place, and time.    Review of Systems  Constitutional:  Negative for chills and fever.  HENT:  Negative for hearing loss, sinus pain and sore throat.   Eyes:  Negative for blurred vision and double vision.  Respiratory:  Negative for cough and shortness of breath.   Cardiovascular:  Negative for chest pain and palpitations.  Gastrointestinal:  Negative for nausea and vomiting.  Neurological:  Negative for dizziness and speech change.  Psychiatric/Behavioral:  Positive for depression and suicidal ideas. The patient is nervous/anxious.    Blood pressure (!) 146/83, pulse 75, temperature 98.1 F (36.7 C), resp. rate 18, height 6\' 4"  (1.93 m), weight 97.8 kg, SpO2 98%. Body mass index is 26.23 kg/m.   Treatment Plan Summary: Daily contact with patient to assess and evaluate symptoms and progress in treatment and Medication management  Patient is admitted to locked unit under safety precaution Continue on doxepin 50 mg and Abilify 5 mg to target depressive symptoms Patient encouraged to attend group and work on coping strategies Social worker consulted to help with a safe discharge plan   Lewanda Rife, MD

## 2023-08-18 NOTE — Group Note (Signed)
LCSW Group Therapy Note  Group Date: 08/18/2023 Start Time: 1400 End Time: 1500   Type of Therapy and Topic:  Group Therapy - Healthy vs Unhealthy Coping Skills  Participation Level:  Active   Description of Group The focus of this group was to determine what unhealthy coping techniques typically are used by group members and what healthy coping techniques would be helpful in coping with various problems. Patients were guided in becoming aware of the differences between healthy and unhealthy coping techniques. Patients were asked to identify 2-3 healthy coping skills they would like to learn to use more effectively.  Therapeutic Goals Patients learned that coping is what human beings do all day long to deal with various situations in their lives Patients defined and discussed healthy vs unhealthy coping techniques Patients identified their preferred coping techniques and identified whether these were healthy or unhealthy Patients determined 2-3 healthy coping skills they would like to become more familiar with and use more often. Patients provided support and ideas to each other   Summary of Patient Progress:  During group, Tristin expressed that movement was a positive coping skill. Patient proved open to input from peers and feedback from CSW. Patient demonstrated fair insight into the subject matter, was respectful of peers, and participated throughout the entire session.   Therapeutic Modalities Cognitive Behavioral Therapy Motivational Interviewing  Elza Rafter, [place] 08/18/2023  4:40 PM

## 2023-08-18 NOTE — Group Note (Signed)
Date:  08/18/2023 Time:  9:01 PM  Group Topic/Focus:  Developing a Wellness Toolbox:   The focus of this group is to help patients develop a "wellness toolbox" with skills and strategies to promote recovery upon discharge.    Participation Level:  Active  Participation Quality:  Appropriate  Affect:  Appropriate  Cognitive:  Appropriate  Insight: Appropriate  Engagement in Group:  Engaged  Modes of Intervention:  Education  Additional Comments:    Garry Heater 08/18/2023, 9:01 PM

## 2023-08-18 NOTE — Group Note (Signed)
Date:  08/18/2023 Time:  3:43 PM  Group Topic/Focus:  Wellness Toolbox:   The focus of this group is to discuss various aspects of wellness, balancing those aspects and exploring ways to increase the ability to experience wellness.  Patients will create a wellness toolbox for use upon discharge.    Participation Level:  Active  Participation Quality:  Appropriate  Affect:  Appropriate  Cognitive:  Appropriate  Insight: Improving  Engagement in Group:  Engaged  Modes of Intervention:  Discussion  Additional Comments:    [name] 08/18/2023, 3:43 PM

## 2023-08-19 DIAGNOSIS — F332 Major depressive disorder, recurrent severe without psychotic features: Secondary | ICD-10-CM | POA: Diagnosis not present

## 2023-08-19 MED ORDER — DOXEPIN HCL 50 MG PO CAPS
75.0000 mg | ORAL_CAPSULE | Freq: Every day | ORAL | Status: DC
  Administered 2023-08-19 – 2023-08-21 (×3): 75 mg via ORAL
  Filled 2023-08-19 (×3): qty 1

## 2023-08-19 NOTE — Plan of Care (Signed)
[facility] Tonsillectomy/Adenoidectomy, Postoperative PEDS care plan entered in error.

## 2023-08-19 NOTE — Group Note (Signed)
Date:  08/19/2023 Time:  9:02 AM  Group Topic/Focus:  Healthy Communication:   The focus of this group is to discuss communication, barriers to communication, as well as healthy ways to communicate with others.       Participation Level:  Active  Participation Quality:  Appropriate, Attentive, Sharing, and Supportive  Affect:  Appropriate  Cognitive:  Alert  Insight: Appropriate and Improving  Engagement in Group:  Engaged and Supportive  Modes of Intervention:  Activity  Additional Comments:     Alexis Frock 08/19/2023, 9:02 AM

## 2023-08-19 NOTE — Progress Notes (Signed)
   08/19/23 2000  Psych Admission Type (Psych Patients Only)  Admission Status Voluntary  Psychosocial Assessment  Patient Complaints None  Eye Contact Fair  Facial Expression Animated  Affect Appropriate to circumstance  Speech Logical/coherent  Interaction Assertive  Motor Activity Other (Comment) (WNL)  Appearance/Hygiene Unremarkable;In scrubs  Behavior Characteristics Cooperative;Appropriate to situation  Mood Pleasant  Thought Process  Coherency WDL  Content WDL  Delusions None reported or observed  Perception WDL  Hallucination None reported or observed  Judgment WDL  Confusion None  Danger to Self  Current suicidal ideation? Denies  Agreement Not to Harm Self Yes  Description of Agreement verbal  Danger to Others  Danger to Others None reported or observed

## 2023-08-19 NOTE — Group Note (Signed)
Date:  08/19/2023 Time:  9:07 AM  Group Topic/Focus:  Self Care:   The focus of this group is to help patients understand the importance of self-care in order to improve or restore emotional, physical, spiritual, interpersonal, and financial health.    Participation Level:  Active  Participation Quality:  Appropriate, Attentive, and Supportive  Affect:  Appropriate  Cognitive:  Alert, Appropriate, and Oriented  Insight: Appropriate, Good, and Improving  Engagement in Group:  Engaged and Supportive  Modes of Intervention:  Discussion  Additional Comments:     Robert Chan 08/19/2023, 9:07 AM

## 2023-08-19 NOTE — Progress Notes (Signed)
   08/19/23 0602  15 Minute Checks  Location Bedroom  Visual Appearance Calm  Behavior Sleeping  Sleep (Behavioral Health Patients Only)  Calculate sleep? (Click Yes once per 24 hr at 0600 safety check) Yes  Documented sleep last 24 hours 8.5

## 2023-08-19 NOTE — Group Note (Signed)
Date:  08/19/2023 Time:  9:12 PM  Group Topic/Focus:  Overcoming Stress:   The focus of this group is to define stress and help patients assess their triggers.    Participation Level:  Active  Participation Quality:  Appropriate  Affect:  Appropriate  Cognitive:  Appropriate  Insight: Appropriate  Engagement in Group:  Engaged  Modes of Intervention:  Exploration  Additional Comments:    Garry Heater 08/19/2023, 9:12 PM

## 2023-08-19 NOTE — Plan of Care (Signed)
D: Pt alert and oriented. Pt denies experiencing any anxiety/depression at this time. Pt denies experiencing any pain at this time. Pt denies experiencing any SI/HI, or AVH at this time.   A: Scheduled medications administered to pt, per MD orders. Support and encouragement provided. Frequent verbal contact made. Routine safety checks conducted q15 minutes.   R: No adverse drug reactions noted. Pt verbally contracts for safety at this time. Pt compliant with medications and treatment plan. Pt interacts well with others on the unit. Pt remains safe at this time. Plan of care ongoing.  Problem: Education: Goal: Knowledge of General Education information will improve Description: Including pain rating scale, medication(s)/side effects and non-pharmacologic comfort measures Outcome: Progressing   Problem: Nutrition: Goal: Adequate nutrition will be maintained Outcome: Progressing   Problem: Coping: Goal: Level of anxiety will decrease Outcome: Progressing

## 2023-08-19 NOTE — Progress Notes (Signed)
[facility] MD Progress Note  08/19/2023  Caelen Nyborg  MRN:  [id number]   Dominick is a 67 year old African-American male who was voluntarily admitted to geriatric psychiatry for worsening depression and SI.    Subjective: Chart reviewed, case discussed in multidisciplinary meeting, patient seen during rounds today.  Not much change in mental status since yesterday.  Although staff reports that patient has been denying suicidal thoughts during their assessment.  Patient reports he does not feel at his baseline yet.  Patient said he needs few more days, he wants to make sure he gets the help he needs before his discharge.  Patient continues to report depressed mood with suicide thoughts, denies any intention to harm himself on the unit.  Patient was provided with support and reassurance.  Patient has been attending groups on the unit, he was encouraged to work on coping strategies.  Patient reports that his sleep and appetite have improved.  He denies psychotic or manic symptoms.   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   Depression    Elevated serum creatinine 08/28/2019   Homelessness 06/19/2020   Homicidal ideations 05/25/2023   Hypertension    Major depressive disorder, recurrent episode, 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/11/2022   Major depressive disorder, single episode, 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/10/2022   MDD (major  depressive disorder), recurrent severe, 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   NSTEMI (non-ST elevated myocardial infarction) (HCC)    Schizoaffective disorder (HCC) 11/08/2022   Status post incision and drainage 04/22/2021   Suicidal ideation 06/05/2020   Suicidal ideation 06/05/2020    Past Surgical History:  Procedure Laterality Date   APPENDECTOMY     EYE SURGERY     LEFT HEART CATH AND CORONARY ANGIOGRAPHY N/A 06/05/2020   Procedure: LEFT HEART CATH AND CORONARY ANGIOGRAPHY;  Surgeon: Elder Negus, MD;  Location: [facility];  Service: Cardiovascular;  Laterality: N/A;   PROSTATE SURGERY     Family History:  Family History  Problem Relation Age of Onset   Hypertension Mother    Diabetes Mother    Hypertension Father    Hypertension Sister     Social History:  Social History   Substance and Sexual Activity  Alcohol Use Yes   Comment: 4 40oz beer a week     Social History   Substance and Sexual Activity  Drug Use Not Currently   Types: Cocaine   Comment: cocaine use once a month, last use 6 mos ago    Social History   Socioeconomic History   Marital status: Single    Spouse name: Not on file   Number of children: Not on file   Years of education: Not on file   Highest education level: Not on file  Occupational History   Not on file  Tobacco Use   Smoking status: Some Days    Current packs/day: 0.00    Types: Cigarettes    Last attempt to quit: 12/25/2020  Years since quitting: 2.6   Smokeless tobacco: Never   Tobacco comments:    Smokes a couple cigarettes every now and then  Vaping Use   Vaping status: Never Used  Substance and Sexual Activity   Alcohol use: Yes    Comment: 4 40oz beer a week   Drug use: Not Currently    Types: Cocaine    Comment: cocaine use once a month, last use 6 mos ago   Sexual activity: Not Currently  Other Topics Concern   Not on file  Social History Narrative   His mother passed at age 80 (when he was 67  years old) and his father raised him and his siblings   His parents were pastors as a lot of his sisters and other family members are today   There was a total of 13 children in his family Had 86 sisters, one sister deceased , Had 3 brothers, all brothers deceased   He is the youngest of the 61 and was "always in trouble in my younger years"   Family still live in [place], [place], some in [place], [place]   Has a Daughter and son with a total of 10 grandchildren (6 grand daughters local and 4 grandsons)    Values family   Married twice, present wife incarcerated   Social Determinants of Health   Financial Resource Strain: Low Risk  (10/19/2022)   Overall Financial Resource Strain (CARDIA)    Difficulty of Paying Living Expenses: Not hard at all  Food Insecurity: No Food Insecurity (08/09/2023)   Hunger Vital Sign    Worried About Running Out of Food in the Last Year: Never true    Ran Out of Food in the Last Year: Never true  Transportation Needs: Unmet Transportation Needs (08/09/2023)   PRAPARE - Administrator, Civil Service (Medical): Yes    Lack of Transportation (Non-Medical): Yes  Physical Activity: Inactive (10/19/2022)   Exercise Vital Sign    Days of Exercise per Week: 0 days    Minutes of Exercise per Session: 0 min  Stress: No Stress Concern Present (10/19/2022)   [name] of Occupational Health - Occupational Stress Questionnaire    Feeling of Stress : Not at all  Social Connections: Moderately Integrated (03/31/2022)   Social Connection and Isolation Panel [NHANES]    Frequency of Communication with Friends and Family: Three times a week    Frequency of Social Gatherings with Friends and Family: Three times a week    Attends Religious Services: 1 to 4 times per year    Active Member of Clubs or Organizations: Yes    Attends Banker Meetings: 1 to 4 times per year    Marital Status: Never married   Additional Social History:    Patient is homeless, he is working with Child psychotherapist on housing options                      Sleep: Fair  Appetite:  Good  Current Medications: Current Facility-Administered Medications  Medication Dose Route Frequency Provider Last Rate Last Admin   acetaminophen (TYLENOL) tablet 650 mg  650 mg Oral Q6H PRN Motley-Mangrum, Jadeka A, PMHNP   650 mg at 08/18/23 1453   albuterol (VENTOLIN HFA) 108 (90 Base) MCG/ACT inhaler 2 puff  2 puff Inhalation Q6H PRN Motley-Mangrum, Jadeka A, PMHNP       alum & mag hydroxide-simeth (MAALOX/MYLANTA) 200-200-20 MG/5ML suspension 30 mL  30 mL  Oral Q4H PRN Motley-Mangrum, Jadeka A, PMHNP       ARIPiprazole (ABILIFY) tablet 5 mg  5 mg Oral Daily [name], DO   5 mg at 08/19/23 3235   atorvastatin (LIPITOR) tablet 80 mg  80 mg Oral Daily [name], DO   80 mg at 08/19/23 5732   diltiazem (CARDIZEM CD) 24 hr capsule 180 mg  180 mg Oral Daily [name], DO   180 mg at 08/19/23 2025   diphenhydrAMINE (BENADRYL) capsule 50 mg  50 mg Oral TID PRN Motley-Mangrum, Ezra Sites, PMHNP       Or   diphenhydrAMINE (BENADRYL) injection 50 mg  50 mg Intramuscular TID PRN Motley-Mangrum, Geralynn Ochs A, PMHNP       doxepin (SINEQUAN) capsule 50 mg  50 mg Oral QHS [name], DO   50 mg at 08/18/23 2124   guaiFENesin (MUCINEX) 12 hr tablet 600 mg  600 mg Oral Q6H PRN Jearld Lesch, NP   600 mg at 08/13/23 0737   haloperidol (HALDOL) tablet 5 mg  5 mg Oral TID PRN Motley-Mangrum, Geralynn Ochs A, PMHNP       Or   haloperidol lactate (HALDOL) injection 5 mg  5 mg Intramuscular TID PRN Motley-Mangrum, Jadeka A, PMHNP       hydrOXYzine (ATARAX) tablet 50 mg  50 mg Oral TID PRN [name], DO   50 mg at 08/18/23 2123   latanoprost (XALATAN) 0.005 % ophthalmic solution 1 drop  1 drop Both Eyes QHS [name], DO   1 drop at 08/18/23 2123   magnesium hydroxide (MILK OF MAGNESIA) suspension 30 mL  30 mL  Oral Daily PRN Motley-Mangrum, Jadeka A, PMHNP       mometasone-formoterol (DULERA) 200-5 MCG/ACT inhaler 2 puff  2 puff Inhalation BID Motley-Mangrum, Jadeka A, PMHNP   2 puff at 08/19/23 0814   polyethylene glycol (MIRALAX / GLYCOLAX) packet 17 g  17 g Oral Daily [name], DO   17 g at 08/19/23 4270   traZODone (DESYREL) tablet 50 mg  50 mg Oral QHS PRN [name], DO   50 mg at 08/18/23 2123    Lab Results: No results found for this or any previous visit (from the past 48 hour(s)).  Blood Alcohol level:  Lab Results  Component Value Date   ETH <10 08/08/2023   ETH <10 07/15/2023    Metabolic Disorder Labs: Lab Results  Component Value Date   HGBA1C 5.2 01/13/2023   MPG 103 01/13/2023   MPG 91.06 09/07/2020   No results found for: "PROLACTIN" Lab Results  Component Value Date   CHOL 168 01/13/2023   TRIG 62 01/13/2023   HDL 57 01/13/2023   CHOLHDL 2.9 01/13/2023   VLDL 12 01/13/2023   LDLCALC 99 01/13/2023   LDLCALC 104 (H) 05/17/2022     Musculoskeletal: Strength & Muscle Tone: within normal limits Gait & Station: normal Patient leans: N/A  Psychiatric Specialty Exam:  Presentation  General Appearance:  Appropriate for Environment  Eye Contact: Good  Speech: Clear and Coherent  Speech Volume: Normal  Handedness: Right   Mood and Affect  Mood: Depressed  Affect: Constricted   Thought Process  Thought Processes: Coherent  Descriptions of Associations:Intact  Orientation:Full (Time, Place and Person)  Thought Content:WDL  History of Schizophrenia/Schizoaffective disorder:Yes  Duration of Psychotic Symptoms:Greater than six months  Hallucinations:Denies Ideas of Reference:None  Suicidal Thoughts:Positive Homicidal Thoughts:Denies  Sensorium  Memory: Recent Fair; Immediate  Good  Judgment: Limited  Insight: Fair   Chartered certified accountant: Fair  Attention  Span: Fair  Recall: Jennelle Human of Knowledge: Fair  Language: Fair   Psychomotor Activity  Psychomotor Activity:Decreased  Assets  Assets: Manufacturing systems engineer; Desire for Improvement; Housing; Social Support   Sleep  Sleep:Improved   Physical Exam: Physical Exam Constitutional:      Appearance: Normal appearance.  HENT:     Head: Normocephalic and atraumatic.     Nose: Nose normal.  Eyes:     Pupils: Pupils are equal, round, and reactive to light.  Cardiovascular:     Rate and Rhythm: Regular rhythm.     Pulses: Normal pulses.  Pulmonary:     Effort: Pulmonary effort is normal.  Skin:    General: Skin is warm.  Neurological:     General: No focal deficit present.     Mental Status: He is alert and oriented to person, place, and time.    Review of Systems  Constitutional:  Negative for chills and fever.  HENT:  Negative for hearing loss, sinus pain and sore throat.   Eyes:  Negative for blurred vision and double vision.  Respiratory:  Negative for cough and shortness of breath.   Cardiovascular:  Negative for chest pain and palpitations.  Gastrointestinal:  Negative for nausea and vomiting.  Neurological:  Negative for dizziness and speech change.  Psychiatric/Behavioral:  Positive for depression and suicidal ideas. The patient is nervous/anxious.    Blood pressure 129/80, pulse 73, temperature (!) 97 F (36.1 C), resp. rate 18, height 6\' 4"  (1.93 m), weight 97.8 kg, SpO2 99%. Body mass index is 26.23 kg/m.   Treatment Plan Summary: Daily contact with patient to assess and evaluate symptoms and progress in treatment and Medication management  Patient is admitted to locked unit under safety precaution Will increase the dose of doxepin to 75 mg , 08/19/23 Abilify 5 mg to target depressive symptoms Patient encouraged to attend group and work on coping strategies Social worker consulted to help with a safe discharge plan   Lewanda Rife, MD

## 2023-08-20 DIAGNOSIS — F332 Major depressive disorder, recurrent severe without psychotic features: Secondary | ICD-10-CM | POA: Diagnosis not present

## 2023-08-20 NOTE — Group Note (Signed)
Date:  08/20/2023 Time:  11:45 PM  Group Topic/Focus:  Developing a Wellness Toolbox:   The focus of this group is to help patients develop a "wellness toolbox" with skills and strategies to promote recovery upon discharge.    Participation Level:  Active  Participation Quality:  Appropriate  Affect:  Appropriate  Cognitive:  Appropriate  Insight: Appropriate  Engagement in Group:  Engaged  Modes of Intervention:  Education  Additional Comments:    Garry Heater 08/20/2023, 11:45 PM

## 2023-08-20 NOTE — Group Note (Signed)
Date:  08/20/2023 Time:  6:30 PM  Group Topic/Focus:  Coping With Mental Health Crisis:   The purpose of this group is to help patients identify strategies for coping with mental health crisis.  Group discusses possible causes of crisis and ways to manage them effectively.    Participation Level:  Active  Participation Quality:  Appropriate  Affect:  Appropriate  Cognitive:  Appropriate  Insight: Appropriate  Engagement in Group:  Engaged  Modes of Intervention:  Activity  Additional Comments:    Robert Chan 08/20/2023, 6:30 PM

## 2023-08-20 NOTE — Progress Notes (Signed)
[facility] MD Progress Note  08/20/2023  Miriam Biondo  MRN:  [id number]   Robert Chan is a 67 year old African-American male who was voluntarily admitted to geriatric psychiatry for worsening depression and SI.    Subjective: Chart reviewed, case discussed with staff, patient seen during rounds today.  Patient reports he is doing fine, but still has suicidal thoughts.  Patient said he needs few more days, he wants to make sure he gets the help he needs before his discharge.  He denies any intention to harm himself on the unit.  Patient was provided with support and reassurance.  Patient has been attending groups on the unit, he was encouraged to work on coping strategies.  Patient is observed watching TV and interacting with peers in the area.  Patient reports that his sleep and appetite have improved.  He denies psychotic or manic symptoms.  Patient denies side effects from increase in the dose of doxepin.   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   Depression    Elevated serum creatinine 08/28/2019   Homelessness 06/19/2020   Homicidal ideations 05/25/2023   Hypertension    Major depressive disorder, recurrent episode, 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/11/2022   Major depressive disorder, single episode, 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/10/2022   MDD (major depressive disorder), recurrent severe, 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   NSTEMI  (non-ST elevated myocardial infarction) (HCC)    Schizoaffective disorder (HCC) 11/08/2022   Status post incision and drainage 04/22/2021   Suicidal ideation 06/05/2020   Suicidal ideation 06/05/2020    Past Surgical History:  Procedure Laterality Date   APPENDECTOMY     EYE SURGERY     LEFT HEART CATH AND CORONARY ANGIOGRAPHY N/A 06/05/2020   Procedure: LEFT HEART CATH AND CORONARY ANGIOGRAPHY;  Surgeon: Elder Negus, MD;  Location: [facility];  Service: Cardiovascular;  Laterality: N/A;   PROSTATE SURGERY     Family History:  Family History  Problem Relation Age of Onset   Hypertension Mother    Diabetes Mother    Hypertension Father    Hypertension Sister     Social History:  Social History   Substance and Sexual Activity  Alcohol Use Yes   Comment: 4 40oz beer a week     Social History   Substance and Sexual Activity  Drug Use Not Currently   Types: Cocaine   Comment: cocaine use once a month, last use 6 mos ago    Social History   Socioeconomic History   Marital status: Single    Spouse name: Not on file   Number of children: Not on file   Years of education: Not on file   Highest education level: Not on file  Occupational History   Not on file  Tobacco Use   Smoking status: Some Days    Current packs/day: 0.00    Types: Cigarettes    Last attempt to quit: 12/25/2020    Years since quitting:  2.6   Smokeless tobacco: Never   Tobacco comments:    Smokes a couple cigarettes every now and then  Vaping Use   Vaping status: Never Used  Substance and Sexual Activity   Alcohol use: Yes    Comment: 4 40oz beer a week   Drug use: Not Currently    Types: Cocaine    Comment: cocaine use once a month, last use 6 mos ago   Sexual activity: Not Currently  Other Topics Concern   Not on file  Social History Narrative   His mother passed at age 53 (when he was 66 years old) and his father raised him and his siblings   His parents were pastors as a  lot of his sisters and other family members are today   There was a total of 13 children in his family Had 34 sisters, one sister deceased , Had 3 brothers, all brothers deceased   He is the youngest of the 40 and was "always in trouble in my younger years"   Family still live in [place], [place], some in [place], [place]   Has a Daughter and son with a total of 10 grandchildren (6 grand daughters local and 4 grandsons)    Values family   Married twice, present wife incarcerated   Social Determinants of Health   Financial Resource Strain: Low Risk  (10/19/2022)   Overall Financial Resource Strain (CARDIA)    Difficulty of Paying Living Expenses: Not hard at all  Food Insecurity: No Food Insecurity (08/09/2023)   Hunger Vital Sign    Worried About Running Out of Food in the Last Year: Never true    Ran Out of Food in the Last Year: Never true  Transportation Needs: Unmet Transportation Needs (08/09/2023)   PRAPARE - Administrator, Civil Service (Medical): Yes    Lack of Transportation (Non-Medical): Yes  Physical Activity: Inactive (10/19/2022)   Exercise Vital Sign    Days of Exercise per Week: 0 days    Minutes of Exercise per Session: 0 min  Stress: No Stress Concern Present (10/19/2022)   [name] of Occupational Health - Occupational Stress Questionnaire    Feeling of Stress : Not at all  Social Connections: Moderately Integrated (03/31/2022)   Social Connection and Isolation Panel [NHANES]    Frequency of Communication with Friends and Family: Three times a week    Frequency of Social Gatherings with Friends and Family: Three times a week    Attends Religious Services: 1 to 4 times per year    Active Member of Clubs or Organizations: Yes    Attends Banker Meetings: 1 to 4 times per year    Marital Status: Never married   Additional Social History:   Patient is homeless, he is working with Child psychotherapist on housing options                       Sleep: Fair  Appetite:  Good  Current Medications: Current Facility-Administered Medications  Medication Dose Route Frequency Provider Last Rate Last Admin   acetaminophen (TYLENOL) tablet 650 mg  650 mg Oral Q6H PRN Motley-Mangrum, Jadeka A, PMHNP   650 mg at 08/18/23 1453   albuterol (VENTOLIN HFA) 108 (90 Base) MCG/ACT inhaler 2 puff  2 puff Inhalation Q6H PRN Motley-Mangrum, Jadeka A, PMHNP       alum & mag hydroxide-simeth (MAALOX/MYLANTA) 200-200-20 MG/5ML suspension 30 mL  30 mL Oral Q4H PRN  Motley-Mangrum, Jadeka A, PMHNP       ARIPiprazole (ABILIFY) tablet 5 mg  5 mg Oral Daily [name], DO   5 mg at 08/20/23 0959   atorvastatin (LIPITOR) tablet 80 mg  80 mg Oral Daily [name], DO   80 mg at 08/20/23 4742   diltiazem (CARDIZEM CD) 24 hr capsule 180 mg  180 mg Oral Daily [name], DO   180 mg at 08/20/23 5956   diphenhydrAMINE (BENADRYL) capsule 50 mg  50 mg Oral TID PRN Motley-Mangrum, Geralynn Ochs A, PMHNP       Or   diphenhydrAMINE (BENADRYL) injection 50 mg  50 mg Intramuscular TID PRN Motley-Mangrum, Geralynn Ochs A, PMHNP       doxepin (SINEQUAN) capsule 75 mg  75 mg Oral QHS Lewanda Rife, MD   75 mg at 08/19/23 2112   guaiFENesin (MUCINEX) 12 hr tablet 600 mg  600 mg Oral Q6H PRN Jearld Lesch, NP   600 mg at 08/13/23 0737   haloperidol (HALDOL) tablet 5 mg  5 mg Oral TID PRN Motley-Mangrum, Geralynn Ochs A, PMHNP       Or   haloperidol lactate (HALDOL) injection 5 mg  5 mg Intramuscular TID PRN Motley-Mangrum, Jadeka A, PMHNP       hydrOXYzine (ATARAX) tablet 50 mg  50 mg Oral TID PRN [name], DO   50 mg at 08/19/23 2113   latanoprost (XALATAN) 0.005 % ophthalmic solution 1 drop  1 drop Both Eyes QHS [name], DO   1 drop at 08/19/23 2110   magnesium hydroxide (MILK OF MAGNESIA) suspension 30 mL  30 mL Oral Daily PRN Motley-Mangrum, Jadeka A, PMHNP       mometasone-formoterol (DULERA) 200-5 MCG/ACT  inhaler 2 puff  2 puff Inhalation BID Motley-Mangrum, Jadeka A, PMHNP   2 puff at 08/20/23 0959   polyethylene glycol (MIRALAX / GLYCOLAX) packet 17 g  17 g Oral Daily [name], DO   17 g at 08/20/23 0959   traZODone (DESYREL) tablet 50 mg  50 mg Oral QHS PRN [name], DO   50 mg at 08/19/23 2226    Lab Results: No results found for this or any previous visit (from the past 48 hour(s)).  Blood Alcohol level:  Lab Results  Component Value Date   ETH <10 08/08/2023   ETH <10 07/15/2023    Metabolic Disorder Labs: Lab Results  Component Value Date   HGBA1C 5.2 01/13/2023   MPG 103 01/13/2023   MPG 91.06 09/07/2020   No results found for: "PROLACTIN" Lab Results  Component Value Date   CHOL 168 01/13/2023   TRIG 62 01/13/2023   HDL 57 01/13/2023   CHOLHDL 2.9 01/13/2023   VLDL 12 01/13/2023   LDLCALC 99 01/13/2023   LDLCALC 104 (H) 05/17/2022     Musculoskeletal: Strength & Muscle Tone: within normal limits Gait & Station: normal Patient leans: N/A  Psychiatric Specialty Exam:  Presentation  General Appearance:  Appropriate for Environment  Eye Contact: Good  Speech: Clear and Coherent  Speech Volume: Normal  Handedness: Right   Mood and Affect  Mood: Depressed  Affect: Constricted   Thought Process  Thought Processes: Coherent  Descriptions of Associations:Intact  Orientation:Full (Time, Place and Person)  Thought Content:WDL  History of Schizophrenia/Schizoaffective disorder:Yes  Duration of Psychotic Symptoms:Greater than six months  Hallucinations:Denies Ideas of Reference:None  Suicidal Thoughts:Passive Positive Homicidal Thoughts:Denies  Sensorium  Memory: Recent Fair; Immediate Good  Judgment:  Limited  Insight: Corporate treasurer: Fair  Attention Span: Fair  Recall: Jennelle Human of Knowledge: Fair  Language: Fair   Psychomotor Activity  Psychomotor  Activity:Decreased  Assets  Assets: Manufacturing systems engineer; Desire for Improvement; Housing; Social Support   Sleep  Sleep:Improved   Physical Exam: Physical Exam Constitutional:      Appearance: Normal appearance.  HENT:     Head: Normocephalic and atraumatic.     Nose: Nose normal.  Eyes:     Pupils: Pupils are equal, round, and reactive to light.  Cardiovascular:     Rate and Rhythm: Regular rhythm.     Pulses: Normal pulses.  Pulmonary:     Effort: Pulmonary effort is normal.  Skin:    General: Skin is warm.  Neurological:     General: No focal deficit present.     Mental Status: He is alert and oriented to person, place, and time.    Review of Systems  Constitutional:  Negative for chills and fever.  HENT:  Negative for hearing loss, sinus pain and sore throat.   Eyes:  Negative for blurred vision and double vision.  Respiratory:  Negative for cough and shortness of breath.   Cardiovascular:  Negative for chest pain and palpitations.  Gastrointestinal:  Negative for nausea and vomiting.  Neurological:  Negative for dizziness and speech change.  Psychiatric/Behavioral:  Positive for depression and suicidal ideas.    Blood pressure 132/72, pulse 69, temperature (!) 97.5 F (36.4 C), resp. rate 18, height 6\' 4"  (1.93 m), weight 97.8 kg, SpO2 100%. Body mass index is 26.23 kg/m.   Treatment Plan Summary: Daily contact with patient to assess and evaluate symptoms and progress in treatment and Medication management  Patient is admitted to locked unit under safety precaution Increased the dose of doxepin to 75 mg , 08/19/23 Abilify 5 mg to target depressive symptoms Patient encouraged to attend group and work on coping strategies Social worker consulted to help with a safe discharge plan   Lewanda Rife, MD

## 2023-08-20 NOTE — Progress Notes (Signed)
   08/20/23 0557  15 Minute Checks  Location Bedroom  Visual Appearance Calm  Behavior Sleeping  Sleep (Behavioral Health Patients Only)  Calculate sleep? (Click Yes once per 24 hr at 0600 safety check) Yes  Documented sleep last 24 hours 9.25

## 2023-08-20 NOTE — Progress Notes (Signed)
   08/20/23 2000  Psych Admission Type (Psych Patients Only)  Admission Status Voluntary  Psychosocial Assessment  Patient Complaints Anxiety;Depression  Eye Contact Fair  Facial Expression Flat  Affect Appropriate to circumstance  Speech Logical/coherent  Interaction Assertive  Motor Activity Pacing  Appearance/Hygiene Unremarkable  Behavior Characteristics Cooperative;Appropriate to situation  Mood Pleasant  Thought Process  Coherency WDL  Content WDL  Delusions None reported or observed  Perception WDL  Hallucination None reported or observed  Judgment WDL  Confusion None  Danger to Self  Current suicidal ideation? Denies (Denies)  Agreement Not to Harm Self Yes  Description of Agreement verbal  Danger to Others  Danger to Others None reported or observed

## 2023-08-20 NOTE — Plan of Care (Signed)
D: Pt alert and oriented. Pt reports experiencing anxiety/depression at this time. Pt denies experiencing any pain at this time. Pt denies experiencing any SI/HI, or AVH at this time.   A: Scheduled medications administered to pt, per MD orders. Support and encouragement provided. Frequent verbal contact made. Routine safety checks conducted q15 minutes.   R: No adverse drug reactions noted. Pt verbally contracts for safety at this time. Pt compliant with medications and treatment plan. Pt interacts well with others on the unit. Pt remains safe at this time. Plan of care ongoing.  Pt observed pacing the unit today. Pt attended all groups today. Pt observed active in the milieu.   Problem: Education: Goal: Knowledge of General Education information will improve Description: Including pain rating scale, medication(s)/side effects and non-pharmacologic comfort measures Outcome: Progressing   Problem: Coping: Goal: Level of anxiety will decrease Outcome: Not Progressing

## 2023-08-20 NOTE — Plan of Care (Signed)
  Problem: Nutrition: Goal: Adequate nutrition will be maintained Outcome: Progressing   Problem: Coping: Goal: Level of anxiety will decrease Outcome: Progressing   

## 2023-08-20 NOTE — Group Note (Signed)
Date:  08/20/2023 Time:  4:29 PM  Group Topic/Focus:  Activity Group:  The focus of the group is to encourage patients to step outside in the courtyard to get some fresh air and some exercise for the benefit of their mental health.    Participation Level:  Active  Participation Quality:  Appropriate  Affect:  Appropriate  Cognitive:  Appropriate  Insight: Appropriate  Engagement in Group:  Engaged  Modes of Intervention:  Activity  Additional Comments:    Robert Chan Robert Chan 08/20/2023, 4:29 PM

## 2023-08-20 NOTE — BH IP Treatment Plan (Signed)
Interdisciplinary Treatment and Diagnostic Plan Update  08/20/2023 Time of Session: 11:10 am Robert Chan MRN: [id number]  Principal Diagnosis: MDD (major depressive disorder), recurrent severe, without psychosis (HCC)  Secondary Diagnoses: Principal Problem:   MDD (major depressive disorder), recurrent severe, without psychosis (HCC)   Current Medications:  Current Facility-Administered Medications  Medication Dose Route Frequency Provider Last Rate Last Admin   acetaminophen (TYLENOL) tablet 650 mg  650 mg Oral Q6H PRN Motley-Mangrum, Jadeka A, PMHNP   650 mg at 08/18/23 1453   albuterol (VENTOLIN HFA) 108 (90 Base) MCG/ACT inhaler 2 puff  2 puff Inhalation Q6H PRN Motley-Mangrum, Jadeka A, PMHNP       alum & mag hydroxide-simeth (MAALOX/MYLANTA) 200-200-20 MG/5ML suspension 30 mL  30 mL Oral Q4H PRN Motley-Mangrum, Jadeka A, PMHNP       ARIPiprazole (ABILIFY) tablet 5 mg  5 mg Oral Daily [name], DO   5 mg at 08/20/23 0959   atorvastatin (LIPITOR) tablet 80 mg  80 mg Oral Daily [name], DO   80 mg at 08/20/23 0959   diltiazem (CARDIZEM CD) 24 hr capsule 180 mg  180 mg Oral Daily [name], DO   180 mg at 08/20/23 0959   diphenhydrAMINE (BENADRYL) capsule 50 mg  50 mg Oral TID PRN Motley-Mangrum, Geralynn Ochs A, PMHNP       Or   diphenhydrAMINE (BENADRYL) injection 50 mg  50 mg Intramuscular TID PRN Motley-Mangrum, Jadeka A, PMHNP       doxepin (SINEQUAN) capsule 75 mg  75 mg Oral QHS Lewanda Rife, MD   75 mg at 08/19/23 2112   guaiFENesin (MUCINEX) 12 hr tablet 600 mg  600 mg Oral Q6H PRN Jearld Lesch, NP   600 mg at 08/13/23 0737   haloperidol (HALDOL) tablet 5 mg  5 mg Oral TID PRN Motley-Mangrum, Jadeka A, PMHNP       Or   haloperidol lactate (HALDOL) injection 5 mg  5 mg Intramuscular TID PRN Motley-Mangrum, Jadeka A, PMHNP       hydrOXYzine (ATARAX) tablet 50 mg  50 mg Oral TID PRN [name], DO   50 mg at 08/19/23  2113   latanoprost (XALATAN) 0.005 % ophthalmic solution 1 drop  1 drop Both Eyes QHS [name], DO   1 drop at 08/19/23 2110   magnesium hydroxide (MILK OF MAGNESIA) suspension 30 mL  30 mL Oral Daily PRN Motley-Mangrum, Jadeka A, PMHNP       mometasone-formoterol (DULERA) 200-5 MCG/ACT inhaler 2 puff  2 puff Inhalation BID Motley-Mangrum, Jadeka A, PMHNP   2 puff at 08/20/23 0959   polyethylene glycol (MIRALAX / GLYCOLAX) packet 17 g  17 g Oral Daily [name], DO   17 g at 08/20/23 0959   traZODone (DESYREL) tablet 50 mg  50 mg Oral QHS PRN [name], DO   50 mg at 08/19/23 2226   PTA Medications: Medications Prior to Admission  Medication Sig Dispense Refill Last Dose   albuterol (VENTOLIN HFA) 108 (90 Base) MCG/ACT inhaler Inhale 2 puffs into the lungs every 6 (six) hours as needed for shortness of breath. 18 g 1    mometasone-formoterol (DULERA) 200-5 MCG/ACT AERO Inhale 2 puffs into the lungs 2 (two) times daily. 1 each 1     Patient Stressors:    Patient Strengths:    Treatment Modalities: Medication Management, Group therapy, Case management,  1 to 1 session with clinician, Psychoeducation, Recreational therapy.  Physician Treatment Plan for Primary Diagnosis: MDD (major depressive disorder), recurrent severe, without psychosis (HCC) Long Term Goal(s): Improvement in symptoms so as ready for discharge   Short Term Goals: Ability to identify changes in lifestyle to reduce recurrence of condition will improve Ability to verbalize feelings will improve Ability to disclose and discuss suicidal ideas Ability to demonstrate self-control will improve Ability to identify and develop effective coping behaviors will improve Ability to maintain clinical measurements within normal limits will improve Compliance with prescribed medications will improve Ability to identify triggers associated with substance abuse/mental health issues will  improve  Medication Management: Evaluate patient's response, side effects, and tolerance of medication regimen.  Therapeutic Interventions: 1 to 1 sessions, Unit Group sessions and Medication administration.  Evaluation of Outcomes: Progressing  Physician Treatment Plan for Secondary Diagnosis: Principal Problem:   MDD (major depressive disorder), recurrent severe, without psychosis (HCC)  Long Term Goal(s): Improvement in symptoms so as ready for discharge   Short Term Goals: Ability to identify changes in lifestyle to reduce recurrence of condition will improve Ability to verbalize feelings will improve Ability to disclose and discuss suicidal ideas Ability to demonstrate self-control will improve Ability to identify and develop effective coping behaviors will improve Ability to maintain clinical measurements within normal limits will improve Compliance with prescribed medications will improve Ability to identify triggers associated with substance abuse/mental health issues will improve     Medication Management: Evaluate patient's response, side effects, and tolerance of medication regimen.  Therapeutic Interventions: 1 to 1 sessions, Unit Group sessions and Medication administration.  Evaluation of Outcomes: Progressing   RN Treatment Plan for Primary Diagnosis: MDD (major depressive disorder), recurrent severe, without psychosis (HCC) Long Term Goal(s): Knowledge of disease and therapeutic regimen to maintain health will improve  Short Term Goals: Ability to remain free from injury will improve, Ability to verbalize frustration and anger appropriately will improve, Ability to demonstrate self-control, Ability to participate in decision making will improve, Ability to verbalize feelings will improve, Ability to disclose and discuss suicidal ideas, Ability to identify and develop effective coping behaviors will improve, and Compliance with prescribed medications will  improve  Medication Management: RN will administer medications as ordered by provider, will assess and evaluate patient's response and provide education to patient for prescribed medication. RN will report any adverse and/or side effects to prescribing provider.  Therapeutic Interventions: 1 on 1 counseling sessions, Psychoeducation, Medication administration, Evaluate responses to treatment, Monitor vital signs and CBGs as ordered, Perform/monitor CIWA, COWS, AIMS and Fall Risk screenings as ordered, Perform wound care treatments as ordered.  Evaluation of Outcomes: Progressing   LCSW Treatment Plan for Primary Diagnosis: MDD (major depressive disorder), recurrent severe, without psychosis (HCC) Long Term Goal(s): Safe transition to appropriate next level of care at discharge, Engage patient in therapeutic group addressing interpersonal concerns.  Short Term Goals: Engage patient in aftercare planning with referrals and resources, Increase social support, Increase ability to appropriately verbalize feelings, Increase emotional regulation, Facilitate acceptance of mental health diagnosis and concerns, Facilitate patient progression through stages of change regarding substance use diagnoses and concerns, and Identify triggers associated with mental health/substance abuse issues  Therapeutic Interventions: Assess for all discharge needs, 1 to 1 time with Social worker, Explore available resources and support systems, Assess for adequacy in community support network, Educate family and significant other(s) on suicide prevention, Complete Psychosocial Assessment, Interpersonal group therapy.  Evaluation of Outcomes: Progressing   Progress in Treatment: Attending groups: Yes. Participating in groups: Yes.  Taking medication as prescribed: Yes. Toleration medication: Yes. Family/Significant other contact made: Jermarius Kosa, daughter [phone] and [phone])  Patient understands diagnosis:  Yes. Discussing patient identified problems/goals with staff: Yes. Medical problems stabilized or resolved: Yes. Denies suicidal/homicidal ideation: Yes. Issues/concerns per patient self-inventory: No. Other: none  New problem(s) identified: No, Describe:  none  New Short Term/Long Term Goal(s): detox, elimination of symptoms of psychosis, medication management for mood stabilization; elimination of SI thoughts; development of comprehensive mental wellness/sobriety plan. Update 08/15/23 : No changes at this time   Update 08/20/23 : No changes at this time   Patient Goals:  "try to get the right medicine to keep me going and get better" Update 08/15/23 : No changes at this time  Update 08/20/23 : No changes at this time   Discharge Plan or Barriers: Patient reports that he would like to join a day program or support group in the local area.  He reports plans to return to his home.  CSW team to assist pt in development of appropriate discharge plans.  Update 08/15/23 : No changes at this time Update 08/20/23 : No changes at this time   Reason for Continuation of Hospitalization: Anxiety Depression Medication stabilization Suicidal ideation Withdrawal symptoms   Estimated Length of Stay:  1-7 days Update 08/15/23 : No changes at this time Update 08/20/23 : anticipated discharge date 08/22/23  Last 3 [place] Suicide Severity Risk Score: Flowsheet Row Admission (Current) from 08/08/2023 in [facility] BE[facility]AVIORAL [facility] Most recent reading at 08/09/2023 12:00 AM ED from 08/08/2023 in [facility] Emergency Department at [facility] Most recent reading at 08/08/2023 12:06 PM ED from 07/15/2023 in [facility] Emergency Department at [facility] Most recent reading at 07/16/2023 12:04 AM  C-SSRS RISK CATEGORY High Risk High Risk Moderate Risk       Last PHQ 2/9 Scores:    10/19/2022    2:53 PM 05/11/2022    2:30 PM 03/31/2022    2:15 PM  Depression screen PHQ 2/9   Decreased Interest 0 1 0  Down, Depressed, Hopeless 0 1 0  PHQ - 2 Score 0 2 0  Altered sleeping  0   Tired, decreased energy  1   Change in appetite  1   Trouble concentrating  1   Moving slowly or fidgety/restless  0   Suicidal thoughts  0   PHQ-9 Score  5     Scribe for Treatment Team: Marshell Levan, LCSW 08/20/2023 11:09 AM

## 2023-08-21 DIAGNOSIS — F332 Major depressive disorder, recurrent severe without psychotic features: Secondary | ICD-10-CM | POA: Diagnosis not present

## 2023-08-21 NOTE — Progress Notes (Signed)
[facility] MD Progress Note  08/21/2023  Robert Chan  MRN:  [id number]   Robert Chan is a 67 year old African-American male who was voluntarily admitted to geriatric psychiatry for worsening depression and SI.    Subjective: Chart reviewed, case discussed with staff, patient seen during rounds today.  Patient reports he is doing "better" today.  We discussed discharge planning.  Patient feels ready to be discharged tomorrow.  He said that he will need ride to [place].  Patient has been attending groups on the unit, he was encouraged to work on coping strategies.  Patient is observed watching TV and interacting with peers in the day area.  Patient reports that his sleep and appetite have improved.  He denies psychotic or manic symptoms.  He denies suicidal or homicidal ideations.  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   Depression    Elevated serum creatinine 08/28/2019   Homelessness 06/19/2020   Homicidal ideations 05/25/2023   Hypertension    Major depressive disorder, recurrent episode, 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/11/2022   Major depressive disorder, single episode, 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/10/2022   MDD (major depressive disorder), recurrent severe, 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   NSTEMI (non-ST elevated myocardial infarction) (HCC)    Schizoaffective disorder (HCC) 11/08/2022   Status post incision and drainage  04/22/2021   Suicidal ideation 06/05/2020   Suicidal ideation 06/05/2020    Past Surgical History:  Procedure Laterality Date   APPENDECTOMY     EYE SURGERY     LEFT HEART CATH AND CORONARY ANGIOGRAPHY N/A 06/05/2020   Procedure: LEFT HEART CATH AND CORONARY ANGIOGRAPHY;  Surgeon: Elder Negus, MD;  Location: [facility] INVASIVE CV LAB;  Service: Cardiovascular;  Laterality: N/A;   PROSTATE SURGERY     Family History:  Family History  Problem Relation Age of Onset   Hypertension Mother    Diabetes Mother    Hypertension Father    Hypertension Sister     Social History:  Social History   Substance and Sexual Activity  Alcohol Use Yes   Comment: 4 40oz beer a week     Social History   Substance and Sexual Activity  Drug Use Not Currently   Types: Cocaine   Comment: cocaine use once a month, last use 6 mos ago    Social History   Socioeconomic History   Marital status: Single    Spouse name: Not on file   Number of children: Not on file   Years of education: Not on file   Highest education level: Not on file  Occupational History   Not on file  Tobacco Use   Smoking status: Some Days    Current packs/day: 0.00    Types: Cigarettes    Last attempt to quit: 12/25/2020    Years since quitting: 2.6   Smokeless tobacco: Never   Tobacco comments:    Smokes a couple cigarettes every now and then  Vaping Use   Vaping  status: Never Used  Substance and Sexual Activity   Alcohol use: Yes    Comment: 4 40oz beer a week   Drug use: Not Currently    Types: Cocaine    Comment: cocaine use once a month, last use 6 mos ago   Sexual activity: Not Currently  Other Topics Concern   Not on file  Social History Narrative   His mother passed at age 75 (when he was 62 years old) and his father raised him and his siblings   His parents were pastors as a lot of his sisters and other family members are today   There was a total of 13 children in his family Had 61 sisters, one sister  deceased , Had 3 brothers, all brothers deceased   He is the youngest of the 50 and was "always in trouble in my younger years"   Family still live in [place], [place], some in [place], [place]   Has a Daughter and son with a total of 10 grandchildren (6 grand daughters local and 4 grandsons)    Values family   Married twice, present wife incarcerated   Social Determinants of Health   Financial Resource Strain: Low Risk  (10/19/2022)   Overall Financial Resource Strain (CARDIA)    Difficulty of Paying Living Expenses: Not hard at all  Food Insecurity: No Food Insecurity (08/09/2023)   Hunger Vital Sign    Worried About Running Out of Food in the Last Year: Never true    Ran Out of Food in the Last Year: Never true  Transportation Needs: Unmet Transportation Needs (08/09/2023)   PRAPARE - Transportation    Lack of Transportation (Medical): Yes    Lack of Transportation (Non-Medical): Yes  Physical Activity: Inactive (10/19/2022)   Exercise Vital Sign    Days of Exercise per Week: 0 days    Minutes of Exercise per Session: 0 min  Stress: No Stress Concern Present (10/19/2022)   [name] of Occupational Health - Occupational Stress Questionnaire    Feeling of Stress : Not at all  Social Connections: Moderately Integrated (03/31/2022)   Social Connection and Isolation Panel [NHANES]    Frequency of Communication with Friends and Family: Three times a week    Frequency of Social Gatherings with Friends and Family: Three times a week    Attends Religious Services: 1 to 4 times per year    Active Member of Clubs or Organizations: Yes    Attends Banker Meetings: 1 to 4 times per year    Marital Status: Never married                         Sleep: Fair  Appetite:  Good  Current Medications: Current Facility-Administered Medications  Medication Dose Route Frequency Provider Last Rate Last Admin   acetaminophen (TYLENOL) tablet 650 mg  650 mg Oral Q6H  PRN Motley-Mangrum, Jadeka A, PMHNP   650 mg at 08/18/23 1453   albuterol (VENTOLIN HFA) 108 (90 Base) MCG/ACT inhaler 2 puff  2 puff Inhalation Q6H PRN Motley-Mangrum, Jadeka A, PMHNP       alum & mag hydroxide-simeth (MAALOX/MYLANTA) 200-200-20 MG/5ML suspension 30 mL  30 mL Oral Q4H PRN Motley-Mangrum, Jadeka A, PMHNP       ARIPiprazole (ABILIFY) tablet 5 mg  5 mg Oral Daily [name], DO   5 mg at 08/21/23 0838   atorvastatin (LIPITOR) tablet 80 mg  80 mg Oral Daily  [name], DO   80 mg at 08/21/23 8295   diltiazem (CARDIZEM CD) 24 hr capsule 180 mg  180 mg Oral Daily [name], DO   180 mg at 08/21/23 6213   diphenhydrAMINE (BENADRYL) capsule 50 mg  50 mg Oral TID PRN Motley-Mangrum, Geralynn Ochs A, PMHNP       Or   diphenhydrAMINE (BENADRYL) injection 50 mg  50 mg Intramuscular TID PRN Motley-Mangrum, Geralynn Ochs A, PMHNP       doxepin (SINEQUAN) capsule 75 mg  75 mg Oral QHS Lewanda Rife, MD   75 mg at 08/20/23 2117   guaiFENesin (MUCINEX) 12 hr tablet 600 mg  600 mg Oral Q6H PRN Jearld Lesch, NP   600 mg at 08/13/23 0737   haloperidol (HALDOL) tablet 5 mg  5 mg Oral TID PRN Motley-Mangrum, Geralynn Ochs A, PMHNP       Or   haloperidol lactate (HALDOL) injection 5 mg  5 mg Intramuscular TID PRN Motley-Mangrum, Jadeka A, PMHNP       hydrOXYzine (ATARAX) tablet 50 mg  50 mg Oral TID PRN [name], DO   50 mg at 08/20/23 2117   latanoprost (XALATAN) 0.005 % ophthalmic solution 1 drop  1 drop Both Eyes QHS [name], DO   1 drop at 08/20/23 2116   magnesium hydroxide (MILK OF MAGNESIA) suspension 30 mL  30 mL Oral Daily PRN Motley-Mangrum, Jadeka A, PMHNP       mometasone-formoterol (DULERA) 200-5 MCG/ACT inhaler 2 puff  2 puff Inhalation BID Motley-Mangrum, Jadeka A, PMHNP   2 puff at 08/21/23 0839   polyethylene glycol (MIRALAX / GLYCOLAX) packet 17 g  17 g Oral Daily [name], DO   17 g at 08/21/23 0865   traZODone  (DESYREL) tablet 50 mg  50 mg Oral QHS PRN [name], DO   50 mg at 08/20/23 2117    Lab Results: No results found for this or any previous visit (from the past 48 hour(s)).  Blood Alcohol level:  Lab Results  Component Value Date   ETH <10 08/08/2023   ETH <10 07/15/2023    Metabolic Disorder Labs: Lab Results  Component Value Date   HGBA1C 5.2 01/13/2023   MPG 103 01/13/2023   MPG 91.06 09/07/2020   No results found for: "PROLACTIN" Lab Results  Component Value Date   CHOL 168 01/13/2023   TRIG 62 01/13/2023   HDL 57 01/13/2023   CHOLHDL 2.9 01/13/2023   VLDL 12 01/13/2023   LDLCALC 99 01/13/2023   LDLCALC 104 (H) 05/17/2022     Musculoskeletal: Strength & Muscle Tone: within normal limits Gait & Station: normal Patient leans: N/A  Psychiatric Specialty Exam:  Presentation  General Appearance:  Appropriate for Environment  Eye Contact: Good  Speech: Clear and Coherent  Speech Volume: Normal  Handedness: Right   Mood and Affect  Mood: In "better"  Affect: Less constricted   Thought Process  Thought Processes: Coherent  Descriptions of Associations:Intact  Orientation:Full (Time, Place and Person)  Thought Content:WDL  History of Schizophrenia/Schizoaffective disorder:Yes  Duration of Psychotic Symptoms:Greater than six months  Hallucinations:Denies Ideas of Reference:None  Suicidal Thoughts: Denies suicide thoughts Homicidal Thoughts:Denies  Sensorium  Memory: Recent Fair; Immediate Good  Judgment: Improving  Insight: Fair   Chartered certified accountant: Fair  Attention Span: Fair  Recall: [employer] of Knowledge: Fair  Language: Fair   Psychomotor Activity  Psychomotor Activity: Normal Assets  Assets: Manufacturing systems engineer;  Desire for Improvement; Housing; Social Support   Sleep  Sleep:Improved   Physical Exam: Physical Exam Constitutional:      Appearance: Normal  appearance.  HENT:     Head: Normocephalic and atraumatic.     Nose: Nose normal.  Eyes:     Pupils: Pupils are equal, round, and reactive to light.  Cardiovascular:     Rate and Rhythm: Regular rhythm.     Pulses: Normal pulses.  Pulmonary:     Effort: Pulmonary effort is normal.  Skin:    General: Skin is warm.  Neurological:     General: No focal deficit present.     Mental Status: He is alert and oriented to person, place, and time.    Review of Systems  Constitutional:  Negative for chills and fever.  HENT:  Negative for hearing loss, sinus pain and sore throat.   Eyes:  Negative for blurred vision and double vision.  Respiratory:  Negative for cough and shortness of breath.   Cardiovascular:  Negative for chest pain and palpitations.  Gastrointestinal:  Negative for nausea and vomiting.  Neurological:  Negative for dizziness and speech change.   Blood pressure 130/84, pulse 65, temperature (!) 97.2 F (36.2 C), resp. rate 18, height 6\' 4"  (1.93 m), weight 97.8 kg, SpO2 100%. Body mass index is 26.23 kg/m.   Treatment Plan Summary: Daily contact with patient to assess and evaluate symptoms and progress in treatment and Medication management  Patient is admitted to locked unit under safety precaution Increased the dose of doxepin to 75 mg , 08/19/23 Abilify 5 mg to target depressive symptoms Patient encouraged to attend group and work on coping strategies Social worker consulted to help with a safe discharge plan   Lewanda Rife, MD

## 2023-08-21 NOTE — Progress Notes (Signed)
   08/21/23 0731  Psych Admission Type (Psych Patients Only)  Admission Status Voluntary  Psychosocial Assessment  Patient Complaints None  Eye Contact Fair  Facial Expression Masked  Affect Appropriate to circumstance  Speech Logical/coherent  Interaction Assertive  Motor Activity Other (Comment) (wdl)  Appearance/Hygiene Unremarkable  Behavior Characteristics Cooperative;Calm  Mood Pleasant  Thought Process  Coherency WDL  Content WDL  Delusions None reported or observed  Perception WDL  Hallucination None reported or observed  Judgment WDL  Confusion None  Danger to Self  Current suicidal ideation? Denies  Danger to Others  Danger to Others None reported or observed

## 2023-08-21 NOTE — Progress Notes (Signed)
   08/21/23 2300  Psych Admission Type (Psych Patients Only)  Admission Status Voluntary  Psychosocial Assessment  Patient Complaints None  Eye Contact Fair  Facial Expression Flat  Affect Appropriate to circumstance  Speech Logical/coherent  Interaction Assertive  Motor Activity Other (Comment) (WNL)  Appearance/Hygiene Unremarkable  Behavior Characteristics Cooperative;Calm  Mood Pleasant  Thought Process  Coherency WDL  Content WDL  Delusions None reported or observed  Perception WDL  Hallucination None reported or observed  Judgment WDL  Confusion None  Danger to Self  Current suicidal ideation? Denies  Agreement Not to Harm Self Yes  Description of Agreement verbal  Danger to Others  Danger to Others None reported or observed

## 2023-08-21 NOTE — Plan of Care (Signed)

## 2023-08-21 NOTE — Group Note (Signed)
LCSW Group Therapy Note   Group Date: 08/21/2023 Start Time: 1240 End Time: 1330   Type of Therapy and Topic:  Group Therapy: Self Care  Participation Level:  Active      Summary of Patient Progress:  The patient attended group. Patient proved open to input from peers and feedback from [facility]. The patient was respectful of peers and participated throughout the entire session. The patient participated during today's icebreaker questions. The patient stated that he loves anything that has to do with sports. Stating that he practices self-care by going for walks and exercising.     Marshell Levan, LCSWA 08/21/2023  3:17 PM

## 2023-08-21 NOTE — Group Note (Signed)
Date:  08/21/2023 Time:  4:55 PM  Group Topic/Focus:  Activity Group:  The focus of the group is to promote activity for the patients and encourage them to go outside to the courtyard and get some fresh air and some exercise.    Participation Level:  Active  Participation Quality:  Appropriate  Affect:  Appropriate  Cognitive:  Appropriate  Insight: Appropriate  Engagement in Group:  Engaged  Modes of Intervention:  Activity  Additional Comments:    Robert Chan 08/21/2023, 4:55 PM

## 2023-08-22 DIAGNOSIS — F332 Major depressive disorder, recurrent severe without psychotic features: Secondary | ICD-10-CM | POA: Diagnosis not present

## 2023-08-22 MED ORDER — DOXEPIN HCL 75 MG PO CAPS: 75.0000 mg | ORAL_CAPSULE | Freq: Every day | ORAL | 0 refills | Status: DC

## 2023-08-22 MED ORDER — HYDROXYZINE HCL 50 MG PO TABS: 50.0000 mg | ORAL_TABLET | Freq: Three times a day (TID) | ORAL | 0 refills | Status: DC | PRN

## 2023-08-22 MED ORDER — MOMETASONE FURO-FORMOTEROL FUM 200-5 MCG/ACT IN AERO: 2.0000 | INHALATION_SPRAY | Freq: Two times a day (BID) | RESPIRATORY_TRACT | 1 refills | Status: DC

## 2023-08-22 MED ORDER — ALBUTEROL SULFATE HFA 108 (90 BASE) MCG/ACT IN AERS: 2.0000 | INHALATION_SPRAY | Freq: Four times a day (QID) | RESPIRATORY_TRACT | 1 refills | Status: DC | PRN

## 2023-08-22 MED ORDER — TRAZODONE HCL 50 MG PO TABS: 50.0000 mg | ORAL_TABLET | Freq: Every evening | ORAL | 0 refills | Status: DC | PRN

## 2023-08-22 MED ORDER — ATORVASTATIN CALCIUM 80 MG PO TABS: 80.0000 mg | ORAL_TABLET | Freq: Every day | ORAL | 0 refills | Status: DC

## 2023-08-22 MED ORDER — DILTIAZEM HCL ER COATED BEADS 180 MG PO CP24: 180.0000 mg | ORAL_CAPSULE | Freq: Every day | ORAL | 0 refills | Status: DC

## 2023-08-22 MED ORDER — ARIPIPRAZOLE 5 MG PO TABS: 5.0000 mg | ORAL_TABLET | Freq: Every day | ORAL | 0 refills | Status: DC

## 2023-08-22 MED ORDER — LATANOPROST 0.005 % OP SOLN: 1.0000 [drp] | Freq: Every day | OPHTHALMIC | 12 refills | Status: DC

## 2023-08-22 NOTE — Progress Notes (Signed)
   08/22/23 0900  Psych Admission Type (Psych Patients Only)  Admission Status Voluntary  Psychosocial Assessment  Patient Complaints None  Eye Contact Fair  Facial Expression Masked  Affect Appropriate to circumstance  Speech Logical/coherent  Interaction Assertive  Motor Activity Other (Comment) (wdl)  Appearance/Hygiene Unremarkable  Behavior Characteristics Cooperative;Calm  Mood Pleasant  Thought Process  Coherency WDL  Content WDL  Delusions None reported or observed  Perception WDL  Hallucination None reported or observed  Judgment WDL  Confusion None  Danger to Self  Current suicidal ideation? Denies  Danger to Others  Danger to Others None reported or observed

## 2023-08-22 NOTE — Plan of Care (Signed)

## 2023-08-22 NOTE — [facility] Suicide Risk Assessment (Signed)
[facility] Discharge Suicide Risk Assessment   Principal Problem: MDD (major depressive disorder), recurrent severe, without psychosis (HCC) Discharge Diagnoses: Principal Problem:   MDD (major depressive disorder), recurrent severe, without psychosis (HCC)   Musculoskeletal: Strength & Muscle Tone: within normal limits Gait & Station: normal Patient leans: N/A   Psychiatric Specialty Exam:   Presentation  General Appearance:  Appropriate for Environment   Eye Contact: Good   Speech: Clear and Coherent   Speech Volume: Normal   Handedness: Right     Mood and Affect  Mood: In "better"   Affect: Stable and animated   Thought Process  Thought Processes: Coherent   Descriptions of Associations:Intact   Orientation:Full (Time, Place and Person)   Thought Content: Within normal limits   History of Schizophrenia/Schizoaffective disorder:Yes   Duration of Psychotic Symptoms:Greater than six months   Hallucinations:Denies Ideas of Reference:None   Suicidal Thoughts: Denies suicide thoughts, no intention or plan Homicidal Thoughts:Denies, no intentions or plan   Sensorium  Memory: Recent Fair; Immediate Good   Judgment: Improved   Insight: Fair     Chartered certified accountant: Fair   Attention Span: Fair   Recall: [employer] of Knowledge: Fair   Language: Fair     Psychomotor Activity  Psychomotor Activity: Normal Assets  Assets: Manufacturing systems engineer; Desire for Improvement; Housing; Social Support     Sleep  Sleep:Improved     Physical Exam: Physical Exam Constitutional:      Appearance: Normal appearance.  HENT:     Head: Normocephalic and atraumatic.     Nose: Nose normal.  Eyes:     Pupils: Pupils are equal, round, and reactive to light.  Cardiovascular:     Rate and Rhythm: Regular rhythm.     Pulses: Normal pulses.  Pulmonary:     Effort: Pulmonary effort is normal.  Skin:    General: Skin is warm.   Neurological:     General: No focal deficit present.     Mental Status: He is alert and oriented to person, place, and time.      Review of Systems  Constitutional:  Negative for chills and fever.  HENT:  Negative for hearing loss, sinus pain and sore throat.   Eyes:  Negative for blurred vision and double vision.  Respiratory:  Negative for cough and shortness of breath.   Cardiovascular:  Negative for chest pain and palpitations.  Gastrointestinal:  Negative for nausea and vomiting.  Neurological:  Negative for dizziness and speech change.   Blood pressure (!) 124/94, pulse 75, temperature 97.6 F (36.4 C), resp. rate 18, height 6\' 4"  (1.93 m), weight 97.8 kg, SpO2 100%. Body mass index is 26.23 kg/m.  Mental Status Per Nursing Assessment::   On Admission:  Suicidal ideation indicated by patient, Suicide plan, Self-harm thoughts, Intention to act on suicide plan, Belief that plan would result in death  Demographic Factors:  Male, Age 67 or older, and Living alone  Loss Factors: Financial problems/change in socioeconomic status  Historical Factors: Prior suicide attempts and Impulsivity  Risk Reduction Factors:   Religious beliefs about death, Positive therapeutic relationship, and Positive coping skills or problem solving skills  Continued Clinical Symptoms:  Previous Psychiatric Diagnoses and Treatments  Cognitive Features That Contribute To Risk:  Thought constriction (tunnel vision)    Suicide Risk:  Minimal: No identifiable suicidal ideation.    Plan Of Care/Follow-up recommendations:  Per Discharge Summary  Robert Rife, MD

## 2023-08-22 NOTE — Progress Notes (Signed)

## 2023-08-22 NOTE — Discharge Summary (Signed)
Physician Discharge Summary Note  Patient:  Robert Chan is an 67 y.o., male MRN:  [id number] DOB:  [birth year] Patient phone:  [phone] (home)  Patient address:   [address] [place] [address],   Date of Admission:  08/08/2023 Date of Discharge: 08/22/2023   Reason for Admission:  Robert Chan is a 67 year old African-American male who was voluntarily admitted to geriatric psychiatry for worsening depression and suicide thoughts.  Principal Problem: MDD (major depressive disorder), recurrent severe, without psychosis (HCC) Discharge Diagnoses: Principal Problem:   MDD (major depressive disorder), recurrent severe, without psychosis (HCC)   Past Psychiatric History: Patient has a long history of depression with multiple psychiatric hospitalizations  Past Medical History:  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   Depression    Elevated serum creatinine 08/28/2019   Homelessness 06/19/2020   Homicidal ideations 05/25/2023   Hypertension    Major depressive disorder, recurrent episode, 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/11/2022   Major depressive disorder, single episode, 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/10/2022   MDD (major depressive disorder), recurrent severe, 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   NSTEMI (non-ST elevated myocardial infarction) (HCC)    Schizoaffective disorder (HCC) 11/08/2022   Status post incision and drainage 04/22/2021   Suicidal ideation 06/05/2020   Suicidal ideation 06/05/2020    Past Surgical History:  Procedure Laterality Date   APPENDECTOMY     EYE SURGERY     LEFT HEART CATH AND CORONARY ANGIOGRAPHY N/A 06/05/2020   Procedure: LEFT HEART CATH AND CORONARY ANGIOGRAPHY;  Surgeon:  Elder Negus, MD;  Location: [facility] INVASIVE CV LAB;  Service: Cardiovascular;  Laterality: N/A;   PROSTATE SURGERY     Family History:  Family History  Problem Relation Age of Onset   Hypertension Mother    Diabetes Mother    Hypertension Father    Hypertension Sister    Social History:  Social History   Substance and Sexual Activity  Alcohol Use Yes   Comment: 4 40oz beer a week     Social History   Substance and Sexual Activity  Drug Use Not Currently   Types: Cocaine   Comment: cocaine use once a month, last use 6 mos ago    Social History   Socioeconomic History   Marital status: Single    Spouse name: Not on file   Number of children: Not on file   Years of education: Not on file   Highest education level: Not on file  Occupational History   Not on file  Tobacco Use   Smoking status: Some Days    Current packs/day: 0.00    Types: Cigarettes    Last attempt to quit: 12/25/2020    Years since quitting: 2.6   Smokeless tobacco: Never   Tobacco comments:    Smokes a couple cigarettes every now and then  Vaping Use   Vaping status: Never Used  Substance and Sexual Activity   Alcohol use: Yes    Comment: 4 40oz beer a week   Drug use: Not Currently    Types: Cocaine    Comment: cocaine use once a month, last use 6 mos ago   Sexual activity: Not Currently  Other Topics  Concern   Not on file  Social History Narrative   His mother passed at age 1 (when he was 72 years old) and his father raised him and his siblings   His parents were pastors as a lot of his sisters and other family members are today   There was a total of 13 children in his family Had 37 sisters, one sister deceased , Had 3 brothers, all brothers deceased   He is the youngest of the 47 and was "always in trouble in my younger years"   Family still live in [place], [place], some in [place], [place]   Has a Daughter and son with a total of 10 grandchildren (6 grand daughters local and 4  grandsons)    Values family   Married twice, present wife incarcerated   Social Determinants of Health   Financial Resource Strain: Low Risk  (10/19/2022)   Overall Financial Resource Strain (CARDIA)    Difficulty of Paying Living Expenses: Not hard at all  Food Insecurity: No Food Insecurity (08/09/2023)   Hunger Vital Sign    Worried About Running Out of Food in the Last Year: Never true    Ran Out of Food in the Last Year: Never true  Transportation Needs: Unmet Transportation Needs (08/09/2023)   PRAPARE - Administrator, Civil Service (Medical): Yes    Lack of Transportation (Non-Medical): Yes  Physical Activity: Inactive (10/19/2022)   Exercise Vital Sign    Days of Exercise per Week: 0 days    Minutes of Exercise per Session: 0 min  Stress: No Stress Concern Present (10/19/2022)   [name] of Occupational Health - Occupational Stress Questionnaire    Feeling of Stress : Not at all  Social Connections: Moderately Integrated (03/31/2022)   Social Connection and Isolation Panel [NHANES]    Frequency of Communication with Friends and Family: Three times a week    Frequency of Social Gatherings with Friends and Family: Three times a week    Attends Religious Services: 1 to 4 times per year    Active Member of Clubs or Organizations: Yes    Attends Banker Meetings: 1 to 4 times per year    Marital Status: Never married    Hospital Course:  The patient was admitted to Inpatient psychiatric treatment for stabilization of depression and suicide thoughts. Patient was placed on suicidal precautions. The patient was evaluated and treated by the multidisciplinary treatment team including physicians, nurses, social workers and therapists. All medications were presented to the patient and the Patient gave consent to all the medications that they were given, as well as was explained the risks, benefits, side effects and alternatives of all medication therapies.  The patient was integrated into the general milieu on the ward and encouraged to attend to his ADLs and participate in all groups and activities. During hospital course the Patient attended coping skill groups, music therapy and activity therapy groups. Patient was counseled on cognitive techniques/skills by multiple staff members and given support care by the staff.  Patient's medication regimen was evaluated and titrated to therapeutic levels to better Patient's overall daily functioning.  Patient was initially followed by Dr. Marlou Porch.  Specifically, the patient was started on Abilify and doxepin.  The patient tolerated the medication well with no significant side effects.  Patient was assessed prior to discharge in presence of social worker  Ms. Reynaldo Minium, and RN [employer].  Patient denied access to guns.  Patient  said "it was  someone else gun and they took it away".  Patient confirmed he does not own guns and there are no guns in his apartment.  During the hospitalization, the patient demonstrated a stabilization of mood with decreased racing thoughts, decreased impulsivity, improved sleep and decreased irritability. At the time of discharge, the patient denied any suicidal ideation/homicidal ideation and was not overtly depressed, manic or psychotic. The Patient was interacting well in groups and on the unit with their peers. Patient was able to identify a safety plan to include speaking with family, contacting outpatient provider or calling 911 if hallucinations/delusions returned or worsened or thoughts of self-harm or suicide return. Patient was counselled on outpatient follow-up that was arranged prior to discharge.  Musculoskeletal: Strength & Muscle Tone: within normal limits Gait & Station: normal Patient leans: N/A   Psychiatric Specialty Exam:   Presentation  General Appearance:  Appropriate for Environment   Eye Contact: Good   Speech: Clear and Coherent   Speech  Volume: Normal   Handedness: Right     Mood and Affect  Mood:  "better"   Affect: Stable and animated   Thought Process  Thought Processes: Coherent   Descriptions of Associations:Intact   Orientation:Full (Time, Place and Person)   Thought Content: Within normal limits   History of Schizophrenia/Schizoaffective disorder:Yes   Duration of Psychotic Symptoms:Greater than six months   Hallucinations:Denies Ideas of Reference:None   Suicidal Thoughts: Denies suicide thoughts, no intention or plan Homicidal Thoughts:Denies, no intentions or plan   Sensorium  Memory: Recent Fair; Immediate Good   Judgment: Improved   Insight: Fair     Chartered certified accountant: Fair   Attention Span: Fair   Recall: [employer] of Knowledge: Fair   Language: Fair     Psychomotor Activity  Psychomotor Activity: Normal Assets  Assets: Manufacturing systems engineer; Desire for Improvement; Housing; Social Support     Sleep  Sleep:Improved     Physical Exam: Physical Exam Constitutional:      Appearance: Normal appearance.  HENT:     Head: Normocephalic and atraumatic.     Nose: Nose normal.  Eyes:     Pupils: Pupils are equal, round, and reactive to light.  Cardiovascular:     Rate and Rhythm: Regular rhythm.     Pulses: Normal pulses.  Pulmonary:     Effort: Pulmonary effort is normal.  Skin:    General: Skin is warm.  Neurological:     General: No focal deficit present.     Mental Status: He is alert and oriented to person, place, and time.      Review of Systems  Constitutional:  Negative for chills and fever.  HENT:  Negative for hearing loss, sinus pain and sore throat.   Eyes:  Negative for blurred vision and double vision.  Respiratory:  Negative for cough and shortness of breath.   Cardiovascular:  Negative for chest pain and palpitations.  Gastrointestinal:  Negative for nausea and vomiting.  Neurological:  Negative for dizziness and  speech change.   Blood pressure (!) 124/94, pulse 75, temperature 97.6 F (36.4 C), resp. rate 18, height 6\' 4"  (1.93 m), weight 97.8 kg, SpO2 100%. Body mass index is 26.23 kg/m.   Social History   Tobacco Use  Smoking Status Some Days   Current packs/day: 0.00   Types: Cigarettes   Last attempt to quit: 12/25/2020   Years since quitting: 2.6  Smokeless Tobacco Never  Tobacco Comments  Smokes a couple cigarettes every now and then   Tobacco Cessation:  N/A, patient does not currently use tobacco products   Blood Alcohol level:  Lab Results  Component Value Date   ETH <10 08/08/2023   ETH <10 07/15/2023    Metabolic Disorder Labs:  Lab Results  Component Value Date   HGBA1C 5.2 01/13/2023   MPG 103 01/13/2023   MPG 91.06 09/07/2020   No results found for: "PROLACTIN" Lab Results  Component Value Date   CHOL 168 01/13/2023   TRIG 62 01/13/2023   HDL 57 01/13/2023   CHOLHDL 2.9 01/13/2023   VLDL 12 01/13/2023   LDLCALC 99 01/13/2023   LDLCALC 104 (H) 05/17/2022     Discharge destination:  Home  Is patient on multiple antipsychotic therapies at discharge:  No    Recommended Plan for Multiple Antipsychotic Therapies: NA   Allergies as of 08/22/2023       Reactions   Shellfish Allergy Hives, Rash   Shellfish-derived Products Rash   Other reaction(s): Other (See Comments)        Medication List     TAKE these medications      Indication  albuterol 108 (90 Base) MCG/ACT inhaler Commonly known as: VENTOLIN HFA Inhale 2 puffs into the lungs every 6 (six) hours as needed for shortness of breath.  Indication: Asthma   ARIPiprazole 5 MG tablet Commonly known as: ABILIFY Take 1 tablet (5 mg total) by mouth daily.  Indication: Major Depressive Disorder   atorvastatin 80 MG tablet Commonly known as: LIPITOR Take 1 tablet (80 mg total) by mouth daily.    diltiazem 180 MG 24 hr capsule Commonly known as: CARDIZEM CD Take 1 capsule (180 mg total)  by mouth daily.    doxepin 75 MG capsule Commonly known as: SINEQUAN Take 1 capsule (75 mg total) by mouth at bedtime.    hydrOXYzine 50 MG tablet Commonly known as: ATARAX Take 1 tablet (50 mg total) by mouth 3 (three) times daily as needed for anxiety.    latanoprost 0.005 % ophthalmic solution Commonly known as: XALATAN Place 1 drop into both eyes at bedtime.    mometasone-formoterol 200-5 MCG/ACT Aero Commonly known as: DULERA Inhale 2 puffs into the lungs 2 (two) times daily.  Indication: Chronic Obstructive Lung Disease   traZODone 50 MG tablet Commonly known as: DESYREL Take 1 tablet (50 mg total) by mouth at bedtime as needed for sleep.         PATIENTS CONDITION AT DISCHARGE:  Stable  TOBACCO CESSATION SCREENING  Patient was screened and counselled on smoking cessation at time of discharge.   PRESCRIPTION ARE LOCATED:  On Chart  DISCHARGE INSTRUCTIONS:  1. Diet: Cardiac healthy  2. Activity: As tolerated  3. Take medications as prescribed and not to make any changes without first consulting with the outpatient provider.  4. Patient was advised to avoid any illicit drugs or alcohol due to negative impact on physical and mental health.  5. Patient should keep all follow up appointments.  TIME SPENT ON DISCHARGE: Over 35 minutes were spent on this patients discharge including a face to face encounter, patient counseling and preparation of discharge materials.    Signed: Lewanda Rife, MD

## 2023-08-22 NOTE — Care Management Important Message (Signed)
Important Message  Patient Details  Name: Robert Chan MRN: [id number] Date of Birth: 30-Jan-1956   Important Message Given:  Yes - Medicare IM     Elza Rafter, LCSWA 08/22/2023, 11:33 AM

## 2023-08-22 NOTE — BHH Counselor (Signed)
CSW and provider discussed pt's access to weapons with pt due to note reporting pt has access to a gun.   Pt reports that he does not own a gun and the gun he used to try and commit suicide belonged to someone else.   Pt reports that he does not have access to that gun anymore.   Provider confirmed with pt that he does not have access to any other weapons. Pt states he does not have access to any weapons   Robert Chan, MSW, [facility] 08/22/2023 10:34 AM

## 2023-08-22 NOTE — Plan of Care (Signed)
  Problem: Education: Goal: Knowledge of General Education information will improve Description: Including pain rating scale, medication(s)/side effects and non-pharmacologic comfort measures 08/22/2023 1255 by Doneen Poisson, RN Outcome: Adequate for Discharge 08/22/2023 1120 by Doneen Poisson, RN Outcome: Progressing   Problem: Health Behavior/Discharge Planning: Goal: Ability to manage health-related needs will improve 08/22/2023 1255 by Doneen Poisson, RN Outcome: Adequate for Discharge 08/22/2023 1120 by Doneen Poisson, RN Outcome: Progressing   Problem: Clinical Measurements: Goal: Ability to maintain clinical measurements within normal limits will improve 08/22/2023 1255 by Doneen Poisson, RN Outcome: Adequate for Discharge 08/22/2023 1120 by Doneen Poisson, RN Outcome: Progressing Goal: Will remain free from infection 08/22/2023 1255 by Doneen Poisson, RN Outcome: Adequate for Discharge 08/22/2023 1120 by Doneen Poisson, RN Outcome: Progressing Goal: Diagnostic test results will improve 08/22/2023 1255 by Doneen Poisson, RN Outcome: Adequate for Discharge 08/22/2023 1120 by Doneen Poisson, RN Outcome: Progressing Goal: Respiratory complications will improve 08/22/2023 1255 by Doneen Poisson, RN Outcome: Adequate for Discharge 08/22/2023 1120 by Doneen Poisson, RN Outcome: Progressing Goal: Cardiovascular complication will be avoided 08/22/2023 1255 by Doneen Poisson, RN Outcome: Adequate for Discharge 08/22/2023 1120 by Doneen Poisson, RN Outcome: Progressing   Problem: Activity: Goal: Risk for activity intolerance will decrease 08/22/2023 1255 by Doneen Poisson, RN Outcome: Adequate for Discharge 08/22/2023 1120 by Doneen Poisson, RN Outcome: Progressing   Problem: Nutrition: Goal: Adequate nutrition will be maintained 08/22/2023 1255 by Doneen Poisson, RN Outcome: Adequate for  Discharge 08/22/2023 1120 by Doneen Poisson, RN Outcome: Progressing   Problem: Coping: Goal: Level of anxiety will decrease 08/22/2023 1255 by Doneen Poisson, RN Outcome: Adequate for Discharge 08/22/2023 1120 by Doneen Poisson, RN Outcome: Progressing   Problem: Elimination: Goal: Will not experience complications related to bowel motility 08/22/2023 1255 by Doneen Poisson, RN Outcome: Adequate for Discharge 08/22/2023 1120 by Doneen Poisson, RN Outcome: Progressing Goal: Will not experience complications related to urinary retention 08/22/2023 1255 by Doneen Poisson, RN Outcome: Adequate for Discharge 08/22/2023 1120 by Doneen Poisson, RN Outcome: Progressing   Problem: Pain Managment: Goal: General experience of comfort will improve 08/22/2023 1255 by Doneen Poisson, RN Outcome: Adequate for Discharge 08/22/2023 1120 by Doneen Poisson, RN Outcome: Progressing   Problem: Safety: Goal: Ability to remain free from injury will improve 08/22/2023 1255 by Doneen Poisson, RN Outcome: Adequate for Discharge 08/22/2023 1120 by Doneen Poisson, RN Outcome: Progressing   Problem: Skin Integrity: Goal: Risk for impaired skin integrity will decrease 08/22/2023 1255 by Doneen Poisson, RN Outcome: Adequate for Discharge 08/22/2023 1120 by Doneen Poisson, RN Outcome: Progressing

## 2023-08-22 NOTE — Progress Notes (Signed)
  [facility] Adult Case Management Discharge Plan :  Will you be returning to the same living situation after discharge:  Yes,  pt will return home  At discharge, do you have transportation home?: Yes,  CSW will assist with transportation  Do you have the ability to pay for your medications: Yes,  [employer] MEDICARE / Suan Halter DUAL COMPLETE  Release of information consent forms completed and in the chart;  Patient's signature needed at discharge.  Patient to Follow up at:  Follow-up Information     [facility] Follow up on 08/29/2023.   Why: Your appointment is scheduled for 08/29/23 at 2:30 PM. The appointment is VIRTUAL. Contact information: [address]  [address] [place] [address] [phone]                 Next level of care provider has access to [facility] Link:no  Safety Planning and Suicide Prevention discussed: Yes,  CSW unable to reach SPE but SPE gone over with pt and brochure given      Has patient been referred to the Quitline?: Patient refused referral for treatment  Patient has been referred for addiction treatment: No known substance use disorder.  [address], LCSWA 08/22/2023, 11:39 AM

## 2023-08-22 NOTE — Group Note (Signed)
Date:  08/22/2023 Time:  3:35 AM  Group Topic/Focus:  Recovery Goals:   The focus of this group is to identify appropriate goals for recovery and establish a plan to achieve them.    Participation Level:  Active  Participation Quality:  Appropriate  Affect:  Appropriate  Cognitive:  Appropriate  Insight: Appropriate  Engagement in Group:  Engaged  Modes of Intervention:  Discussion  Additional Comments:    [name] 08/22/2023, 3:35 AM

## 2023-08-22 NOTE — Progress Notes (Signed)
   08/22/23 0559  15 Minute Checks  Location Bedroom  Visual Appearance Calm  Behavior Composed  Sleep (Behavioral Health Patients Only)  Calculate sleep? (Click Yes once per 24 hr at 0600 safety check) Yes  Documented sleep last 24 hours 9

## 2023-08-22 NOTE — [facility] Suicide Risk Assessment (Signed)
[facility] INPATIENT:  Family/Significant Other Suicide Prevention Education  Suicide Prevention Education:  Contact Attempts: Noell Fresch [phone], pt's daughter, has been identified by the patient as the family member/significant other with whom the patient will be residing, and identified as the person(s) who will aid the patient in the event of a mental health crisis.  With written consent from the patient, two attempts were made to provide suicide prevention education, prior to and/or following the patient's discharge.  We were unsuccessful in providing suicide prevention education.  A suicide education pamphlet was given to the patient to share with family/significant other.  Date and time of first attempt:10/11 Date and time of second attempt:10/14  Elza Rafter 08/22/2023, 11:31 AM

## 2023-08-23 ENCOUNTER — Telehealth: Payer: Self-pay

## 2023-08-23 NOTE — Transitions of Care (Post Inpatient/ED Visit) (Signed)
08/23/2023  Name: Robert Chan MRN: [id number] DOB: 09-06-1956  Today's TOC FU Call Status: Today's TOC FU Call Status:: Unsuccessful Call (1st Attempt) Unsuccessful Call (1st Attempt) Date: 08/23/23  Attempted to reach the patient regarding the most recent Inpatient/ED visit.  Follow Up Plan: Additional outreach attempts will be made to reach the patient to complete the Transitions of Care (Post Inpatient/ED visit) call.   Alyse Low, RN, BA, [facility], CRRN [facility] Coordinator, Transition of Care Ph # [phone]

## 2023-08-24 ENCOUNTER — Telehealth: Payer: Self-pay

## 2023-08-24 NOTE — Transitions of Care (Post Inpatient/ED Visit) (Signed)
08/24/2023  Name: Robert Chan MRN: [id number] DOB: [birth year]  Today's TOC FU Call Status: Today's TOC FU Call Status:: Unsuccessful Call (2nd Attempt) Unsuccessful Call (2nd Attempt) Date: 08/24/23  Attempted to reach the patient regarding the most recent Inpatient/ED visit.  Follow Up Plan: Additional outreach attempts will be made to reach the patient to complete the Transitions of Care (Post Inpatient/ED visit) call.   Alyse Low, RN, BA, [facility], CRRN [facility] Coordinator, Transition of Care Ph # [phone]

## 2023-08-25 ENCOUNTER — Telehealth: Payer: Self-pay

## 2023-08-25 NOTE — Transitions of Care (Post Inpatient/ED Visit) (Signed)
08/25/2023  Name: Robert Chan MRN: [id number] DOB: 06-14-1956  Today's TOC FU Call Status: Today's TOC FU Call Status:: Unsuccessful Call (3rd Attempt) Unsuccessful Call (3rd Attempt) Date: 08/25/23  Attempted to reach the patient regarding the most recent Inpatient/ED visit.  Follow Up Plan: No further outreach attempts will be made at this time. We have been unable to contact the patient.  Alyse Low, RN, BA, [facility], CRRN [facility] Coordinator, Transition of Care Ph # [phone]

## 2023-09-15 ENCOUNTER — Ambulatory Visit (HOSPITAL_COMMUNITY)
Admission: EM | Admit: 2023-09-15 | Discharge: 2023-09-16 | Disposition: A | Discharge status: Other Acute Inpatient Hospital | Payer: 59 | Attending: Psychiatry | Admitting: Psychiatry

## 2023-09-15 DIAGNOSIS — I739 Peripheral vascular disease, unspecified: Secondary | ICD-10-CM | POA: Diagnosis not present

## 2023-09-15 DIAGNOSIS — I10 Essential (primary) hypertension: Secondary | ICD-10-CM

## 2023-09-15 DIAGNOSIS — R45851 Suicidal ideations: Secondary | ICD-10-CM | POA: Diagnosis not present

## 2023-09-15 DIAGNOSIS — F1094 Alcohol use, unspecified with alcohol-induced mood disorder: Secondary | ICD-10-CM

## 2023-09-15 DIAGNOSIS — F1410 Cocaine abuse, uncomplicated: Secondary | ICD-10-CM

## 2023-09-15 DIAGNOSIS — F17200 Nicotine dependence, unspecified, uncomplicated: Secondary | ICD-10-CM

## 2023-09-15 DIAGNOSIS — J4489 Other specified chronic obstructive pulmonary disease: Secondary | ICD-10-CM | POA: Diagnosis not present

## 2023-09-15 DIAGNOSIS — Z79899 Other long term (current) drug therapy: Secondary | ICD-10-CM | POA: Insufficient documentation

## 2023-09-15 DIAGNOSIS — F17210 Nicotine dependence, cigarettes, uncomplicated: Secondary | ICD-10-CM | POA: Diagnosis not present

## 2023-09-15 DIAGNOSIS — F331 Major depressive disorder, recurrent, moderate: Secondary | ICD-10-CM

## 2023-09-15 LAB — POCT URINE DRUG SCREEN - MANUAL ENTRY (I-SCREEN)
POC Amphetamine UR: NOT DETECTED
POC Buprenorphine (BUP): NOT DETECTED
POC Cocaine UR: POSITIVE — AB
POC Marijuana UR: NOT DETECTED
POC Methadone UR: NOT DETECTED
POC Methamphetamine UR: NOT DETECTED
POC Morphine: NOT DETECTED
POC Oxazepam (BZO): NOT DETECTED
POC Oxycodone UR: NOT DETECTED
POC Secobarbital (BAR): NOT DETECTED

## 2023-09-15 LAB — CBC WITH DIFFERENTIAL/PLATELET
Abs Immature Granulocytes: 0.03 10*3/uL (ref 0.00–0.07)
Basophils Absolute: 0.1 10*3/uL (ref 0.0–0.1)
Basophils Relative: 1 %
Eosinophils Absolute: 0.3 10*3/uL (ref 0.0–0.5)
Eosinophils Relative: 4 %
HCT: 40.7 % (ref 39.0–52.0)
Hemoglobin: 13 g/dL (ref 13.0–17.0)
Immature Granulocytes: 0 %
Lymphocytes Relative: 36 %
Lymphs Abs: 3.2 10*3/uL (ref 0.7–4.0)
MCH: 29.9 pg (ref 26.0–34.0)
MCHC: 31.9 g/dL (ref 30.0–36.0)
MCV: 93.6 fL (ref 80.0–100.0)
Monocytes Absolute: 0.7 10*3/uL (ref 0.1–1.0)
Monocytes Relative: 7 %
Neutro Abs: 4.6 10*3/uL (ref 1.7–7.7)
Neutrophils Relative %: 52 %
Platelets: 194 10*3/uL (ref 150–400)
RBC: 4.35 MIL/uL (ref 4.22–5.81)
RDW: 12.6 % (ref 11.5–15.5)
WBC: 8.9 10*3/uL (ref 4.0–10.5)
nRBC: 0 % (ref 0.0–0.2)

## 2023-09-15 LAB — LIPID PANEL
Cholesterol: 159 mg/dL (ref 0–200)
HDL: 61 mg/dL (ref >40)
LDL Cholesterol: 87 mg/dL (ref 0–99)
Total CHOL/HDL Ratio: 2.6 {ratio}
Triglycerides: 53 mg/dL (ref <150)
VLDL: 11 mg/dL (ref 0–40)

## 2023-09-15 LAB — COMPREHENSIVE METABOLIC PANEL
ALT: 15 U/L (ref 0–44)
AST: 19 U/L (ref 15–41)
Albumin: 3.5 g/dL (ref 3.5–5.0)
Alkaline Phosphatase: 70 U/L (ref 38–126)
Anion gap: 9 (ref 5–15)
BUN: 6 mg/dL — ABNORMAL LOW (ref 8–23)
CO2: 24 mmol/L (ref 22–32)
Calcium: 8.9 mg/dL (ref 8.9–10.3)
Chloride: 105 mmol/L (ref 98–111)
Creatinine, Ser: 1.17 mg/dL (ref 0.61–1.24)
GFR, Estimated: >60 mL/min (ref >60)
Glucose, Bld: 90 mg/dL (ref 70–99)
Potassium: 4 mmol/L (ref 3.5–5.1)
Sodium: 138 mmol/L (ref 135–145)
Total Bilirubin: 0.8 mg/dL (ref <1.2)
Total Protein: 7.1 g/dL (ref 6.5–8.1)

## 2023-09-15 LAB — HEMOGLOBIN A1C
Hgb A1c MFr Bld: 5.1 % (ref 4.8–5.6)
Mean Plasma Glucose: 99.67 mg/dL

## 2023-09-15 LAB — ETHANOL: Alcohol, Ethyl (B): <10 mg/dL (ref <10)

## 2023-09-15 LAB — TSH: TSH: 3.515 u[IU]/mL (ref 0.350–4.500)

## 2023-09-15 MED ORDER — ALUM & MAG HYDROXIDE-SIMETH 200-200-20 MG/5ML PO SUSP: 30.0000 mL | ORAL | Status: DC | PRN

## 2023-09-15 MED ORDER — ONDANSETRON 4 MG PO TBDP: 4.0000 mg | ORAL_TABLET | Freq: Four times a day (QID) | ORAL | Status: DC | PRN

## 2023-09-15 MED ORDER — TRAZODONE HCL 50 MG PO TABS: 50.0000 mg | ORAL_TABLET | Freq: Every evening | ORAL | Status: DC | PRN

## 2023-09-15 MED ORDER — MAGNESIUM HYDROXIDE 400 MG/5ML PO SUSP: 30.0000 mL | Freq: Every day | ORAL | Status: DC | PRN

## 2023-09-15 MED ORDER — LOPERAMIDE HCL 2 MG PO CAPS: 2.0000 mg | ORAL_CAPSULE | ORAL | Status: DC | PRN

## 2023-09-15 MED ORDER — LORAZEPAM 1 MG PO TABS: 1.0000 mg | ORAL_TABLET | Freq: Three times a day (TID) | ORAL | Status: DC

## 2023-09-15 MED ORDER — DILTIAZEM HCL ER COATED BEADS 180 MG PO CP24
180.0000 mg | ORAL_CAPSULE | Freq: Every day | ORAL | Status: DC
  Administered 2023-09-15 – 2023-09-16 (×2): 180 mg via ORAL
  Filled 2023-09-15 (×2): qty 1

## 2023-09-15 MED ORDER — THIAMINE HCL 100 MG/ML IJ SOLN
100.0000 mg | Freq: Once | INTRAMUSCULAR | Status: AC
  Administered 2023-09-15: 100 mg via INTRAMUSCULAR
  Filled 2023-09-15: qty 2

## 2023-09-15 MED ORDER — CLONIDINE HCL 0.1 MG PO TABS: 0.1000 mg | ORAL_TABLET | Freq: Two times a day (BID) | ORAL | Status: DC | PRN

## 2023-09-15 MED ORDER — LORAZEPAM 1 MG PO TABS
1.0000 mg | ORAL_TABLET | Freq: Four times a day (QID) | ORAL | Status: DC
  Administered 2023-09-15 – 2023-09-16 (×3): 1 mg via ORAL
  Filled 2023-09-15 (×3): qty 1

## 2023-09-15 MED ORDER — NICOTINE 21 MG/24HR TD PT24
21.0000 mg | MEDICATED_PATCH | Freq: Every day | TRANSDERMAL | Status: DC
  Filled 2023-09-15: qty 1

## 2023-09-15 MED ORDER — LORAZEPAM 1 MG PO TABS: 1.0000 mg | ORAL_TABLET | Freq: Four times a day (QID) | ORAL | Status: DC | PRN

## 2023-09-15 MED ORDER — ARIPIPRAZOLE 5 MG PO TABS
5.0000 mg | ORAL_TABLET | Freq: Every day | ORAL | Status: DC
  Administered 2023-09-15 – 2023-09-16 (×2): 5 mg via ORAL
  Filled 2023-09-15 (×2): qty 1

## 2023-09-15 MED ORDER — ADULT MULTIVITAMIN W/MINERALS CH
1.0000 | ORAL_TABLET | Freq: Every day | ORAL | Status: DC
  Administered 2023-09-15 – 2023-09-16 (×2): 1 via ORAL
  Filled 2023-09-15 (×2): qty 1

## 2023-09-15 MED ORDER — HYDROXYZINE HCL 25 MG PO TABS: 50.0000 mg | ORAL_TABLET | Freq: Three times a day (TID) | ORAL | Status: DC | PRN

## 2023-09-15 MED ORDER — LORAZEPAM 1 MG PO TABS: 1.0000 mg | ORAL_TABLET | Freq: Two times a day (BID) | ORAL | Status: DC

## 2023-09-15 MED ORDER — ATORVASTATIN CALCIUM 40 MG PO TABS
80.0000 mg | ORAL_TABLET | Freq: Every day | ORAL | Status: DC
  Administered 2023-09-15 – 2023-09-16 (×2): 80 mg via ORAL
  Filled 2023-09-15 (×2): qty 2

## 2023-09-15 MED ORDER — MOMETASONE FURO-FORMOTEROL FUM 200-5 MCG/ACT IN AERO
2.0000 | INHALATION_SPRAY | Freq: Two times a day (BID) | RESPIRATORY_TRACT | Status: DC
  Administered 2023-09-15 – 2023-09-16 (×2): 2 via RESPIRATORY_TRACT
  Filled 2023-09-15 (×2): qty 8.8

## 2023-09-15 MED ORDER — ACETAMINOPHEN 325 MG PO TABS: 650.0000 mg | ORAL_TABLET | Freq: Four times a day (QID) | ORAL | Status: DC | PRN

## 2023-09-15 MED ORDER — LORAZEPAM 1 MG PO TABS: 1.0000 mg | ORAL_TABLET | Freq: Every day | ORAL | Status: DC

## 2023-09-15 MED ORDER — THIAMINE MONONITRATE 100 MG PO TABS
100.0000 mg | ORAL_TABLET | Freq: Every day | ORAL | Status: DC
  Administered 2023-09-16: 100 mg via ORAL
  Filled 2023-09-15: qty 1

## 2023-09-15 MED ORDER — ALBUTEROL SULFATE HFA 108 (90 BASE) MCG/ACT IN AERS: 2.0000 | INHALATION_SPRAY | Freq: Four times a day (QID) | RESPIRATORY_TRACT | Status: DC | PRN
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
## 2023-09-15 NOTE — Discharge Instructions (Addendum)
transfer to [facility] 09/16/23

## 2023-09-15 NOTE — ED Notes (Addendum)
Patient brought to unit, oriented and offered meal. Patient reports mood okay but anxious due to being in a new place and not knowing what to expect. Patient alert & oriented x4. Denies intent to harm self or others when asked. Denies A/VH. Patient denies any physical complaints when asked. No acute distress noted. Support and encouragement provided. Routine safety checks conducted per facility protocol. Encouraged patient to notify staff if any thoughts of harm towards self or others arise. Patient verbalizes understanding and agreement.

## 2023-09-15 NOTE — ED Provider Notes (Signed)
[facility] Continuous Assessment Admission H&P  Date: 09/15/23 Patient Name: Robert Chan MRN: [id number] Chief Complaint:   Diagnoses:  Final diagnoses:  Alcohol-induced mood disorder (HCC)  Cocaine use disorder (HCC)  Moderate episode of recurrent major depressive disorder (HCC)  Essential hypertension  Tobacco use disorder  COPD with chronic bronchitis (HCC)  PAD (peripheral artery disease) (HCC)    HPI: Robert Chan 66 y.o., male with a history of major depressive disorder, alcohol abuse, cocaine use, hypertension, patient presented to [facility] as a walk in voluntary unaccompanied with a desire to detox from alcohol and cocaine.  Patient also has a history of recurrent major depressive disorder with suicidal ideations and was hospitalized at [facility] inpatient behavioral health unit on 08/08/2023.   Robert Chan, 67 y.o., male patient seen face to face by this provider, consulted with Dr. Lucianne Muss; and chart reviewed on 09/15/23.    On evaluation Robert Chan reports a lifelong history of alcohol dependence and cocaine use.  Patient reports that his most prolonged period of sobriety was approximately 2 years ago.  He reports that he started use of alcohol in his early teens.  He also is tobacco dependent and reports that when using cocaine he smokes an increased quantity of cigarettes.  Patient endorses that he has trouble with depression and has recently been admitted to inpatient behavioral health due to suicidal ideations.  Patient reports that he occasionally has fleeting thoughts of suicide however does not have a plan to end his life.  Reports on average he drinks approximately 2 to 3 ounce bottles of alcohol per day.  Denies any symptoms of seizures or severe withdrawal symptoms during the periods of time he has attempted to quit use of alcohol previously.  During evaluation Robert Chan is sitting upright in no acute distress. He is alert, oriented x 4, calm, cooperative  and attentive.  His mood is euthymic with congruent affect.  He has normal speech, and behavior.  Objectively there is no evidence of psychosis/mania or delusional thinking.  Patient is able to converse coherently, goal directed thoughts, no distractibility, or pre-occupation.  He also denies suicidal/self-harm/homicidal ideation, psychosis, and paranoia.  Patient answered question appropriately.    Patient is being admitted to the continuous assessment unit while awaiting placement in [facility] for detox. Total Time spent with patient: 30 minutes  Musculoskeletal  Strength & Muscle Tone: within normal limits Gait & Station: normal Patient leans: N/A  Psychiatric Specialty Exam  Presentation General Appearance:  Appropriate for Environment  Eye Contact: Good  Speech: Clear and Coherent  Speech Volume: Normal  Handedness: Right   Mood and Affect  Mood: Dysphoric  Affect: Congruent   Thought Process  Thought Processes: Coherent  Descriptions of Associations:Intact  Orientation:Full (Time, Place and Person)  Thought Content:Logical  Diagnosis of Schizophrenia or Schizoaffective disorder in past: No   Hallucinations:Hallucinations: None  Ideas of Reference:None  Suicidal Thoughts:Suicidal Thoughts: No (None at present endorses periodic thoughts that things would be better if he were not here)  Homicidal Thoughts:Homicidal Thoughts: No   Sensorium  Memory: Immediate Good; Recent Good; Remote Good  Judgment: Fair  Insight: Fair   Art therapist  Concentration: Good  Attention Span: Good  Recall: Good  Fund of Knowledge: Good  Language: Good   Psychomotor Activity  Psychomotor Activity:None   Assets  Assets: Communication Skills; Desire for Improvement; Physical Health; Financial Resources/Insurance   Sleep  Sleep: Fair   Unable to quantify sleep varies   Physical Exam  Constitutional:      Appearance: He is obese.  HENT:      Head: Normocephalic and atraumatic.  Eyes:     Extraocular Movements: Extraocular movements intact.     Pupils: Pupils are equal, round, and reactive to light.  Cardiovascular:     Rate and Rhythm: Normal rate and regular rhythm.  Pulmonary:     Effort: Pulmonary effort is normal.  Musculoskeletal:     Cervical back: Normal range of motion and neck supple.  Skin:    General: Skin is warm.  Neurological:     General: No focal deficit present.     Mental Status: He is alert.      Review of Systems  Psychiatric/Behavioral:  Positive for depression, substance abuse and suicidal ideas.   Passive SI and no active SI at present  Blood pressure 130/82, pulse 73, temperature 98.4 F (36.9 C), temperature source Oral, resp. rate 17, SpO2 100%. There is no height or weight on file to calculate BMI.  Past Psychiatric History:   Is the patient at risk to self? No  Has the patient been a risk to self in the past 6 months? Yes .    Has the patient been a risk to self within the distant past? No   Is the patient a risk to others? No   Has the patient been a risk to others in the past 6 months? No   Has the patient been a risk to others within the distant past? No   Past Medical History: Hypertension, not currently prescribed medication   Family History: No pertient family history provided during this encounter   Social History: Lives alone, [facility]   Last Labs:  Admission on 09/15/2023  Component Date Value Ref Range Status   WBC 09/15/2023 8.9  4.0 - 10.5 K/uL Final   RBC 09/15/2023 4.35  4.22 - 5.81 MIL/uL Final   Hemoglobin 09/15/2023 13.0  13.0 - 17.0 g/dL Final   HCT 84/13/2440 40.7  39.0 - 52.0 % Final   MCV 09/15/2023 93.6  80.0 - 100.0 fL Final   MCH 09/15/2023 29.9  26.0 - 34.0 pg Final   MCHC 09/15/2023 31.9  30.0 - 36.0 g/dL Final   RDW 08/28/2535 12.6  11.5 - 15.5 % Final   Platelets 09/15/2023 194  150 - 400 K/uL Final   nRBC  09/15/2023 0.0  0.0 - 0.2 % Final   Neutrophils Relative % 09/15/2023 52  % Final   Neutro Abs 09/15/2023 4.6  1.7 - 7.7 K/uL Final   Lymphocytes Relative 09/15/2023 36  % Final   Lymphs Abs 09/15/2023 3.2  0.7 - 4.0 K/uL Final   Monocytes Relative 09/15/2023 7  % Final   Monocytes Absolute 09/15/2023 0.7  0.1 - 1.0 K/uL Final   Eosinophils Relative 09/15/2023 4  % Final   Eosinophils Absolute 09/15/2023 0.3  0.0 - 0.5 K/uL Final   Basophils Relative 09/15/2023 1  % Final   Basophils Absolute 09/15/2023 0.1  0.0 - 0.1 K/uL Final   Immature Granulocytes 09/15/2023 0  % Final   Abs Immature Granulocytes 09/15/2023 0.03  0.00 - 0.07 K/uL Final   Performed at [facility], [address], [place], [place] [address]   Sodium 09/15/2023 138  135 - 145 mmol/L Final   Potassium 09/15/2023 4.0  3.5 - 5.1 mmol/L Final   Chloride 09/15/2023 105  98 - 111 mmol/L Final   CO2 09/15/2023  24  22 - 32 mmol/L Final   Glucose, Bld 09/15/2023 90  70 - 99 mg/dL Final   Glucose reference range applies only to samples taken after fasting for at least 8 hours.   BUN 09/15/2023 6 (L)  8 - 23 mg/dL Final   Creatinine, Ser 09/15/2023 1.17  0.61 - 1.24 mg/dL Final   Calcium 16/08/9603 8.9  8.9 - 10.3 mg/dL Final   Total Protein 54/07/8118 7.1  6.5 - 8.1 g/dL Final   Albumin 14/78/2956 3.5  3.5 - 5.0 g/dL Final   AST 21/30/8657 19  15 - 41 U/L Final   ALT 09/15/2023 15  0 - 44 U/L Final   Alkaline Phosphatase 09/15/2023 70  38 - 126 U/L Final   Total Bilirubin 09/15/2023 0.8  <1.2 mg/dL Final   GFR, Estimated 09/15/2023 >60  >60 mL/min Final   Comment: (NOTE) Calculated using the CKD-EPI Creatinine Equation (2021)    Anion gap 09/15/2023 9  5 - 15 Final   Performed at [facility], [address], [place], [place] [address]   Hgb A1c MFr Bld 09/15/2023 5.1  4.8 - 5.6 % Final   Comment: (NOTE) Pre diabetes:          5.7%-6.4%  Diabetes:              >6.4%  Glycemic control for   <7.0% adults  with diabetes    Mean Plasma Glucose 09/15/2023 99.67  mg/dL Final   Performed at [facility], [address], [place], [place] [address]   Alcohol, Ethyl (B) 09/15/2023 <10  <10 mg/dL Final   Comment: (NOTE) Lowest detectable limit for serum alcohol is 10 mg/dL.  For medical purposes only. Performed at [facility], [address], [place], [place] [address]    POC Amphetamine UR 09/15/2023 None Detected  NONE DETECTED (Cut Off Level 1000 ng/mL) Final   POC Secobarbital (BAR) 09/15/2023 None Detected  NONE DETECTED (Cut Off Level 300 ng/mL) Final   POC Buprenorphine (BUP) 09/15/2023 None Detected  NONE DETECTED (Cut Off Level 10 ng/mL) Final   POC Oxazepam (BZO) 09/15/2023 None Detected  NONE DETECTED (Cut Off Level 300 ng/mL) Final   POC Cocaine UR 09/15/2023 Positive (A)  NONE DETECTED (Cut Off Level 300 ng/mL) Final   POC Methamphetamine UR 09/15/2023 None Detected  NONE DETECTED (Cut Off Level 1000 ng/mL) Final   POC Morphine 09/15/2023 None Detected  NONE DETECTED (Cut Off Level 300 ng/mL) Final   POC Methadone UR 09/15/2023 None Detected  NONE DETECTED (Cut Off Level 300 ng/mL) Final   POC Oxycodone UR 09/15/2023 None Detected  NONE DETECTED (Cut Off Level 100 ng/mL) Final   POC Marijuana UR 09/15/2023 None Detected  NONE DETECTED (Cut Off Level 50 ng/mL) Final  Admission on 08/08/2023, Discharged on 08/22/2023  Component Date Value Ref Range Status   Adenovirus 08/13/2023 NOT DETECTED  NOT DETECTED Final   Coronavirus 229E 08/13/2023 NOT DETECTED  NOT DETECTED Final   Comment: (NOTE) The Coronavirus on the Respiratory Panel, DOES NOT test for the novel  Coronavirus (2019 nCoV)    Coronavirus HKU1 08/13/2023 NOT DETECTED  NOT DETECTED Final   Coronavirus NL63 08/13/2023 NOT DETECTED  NOT DETECTED Final   Coronavirus OC43 08/13/2023 NOT DETECTED  NOT DETECTED Final   Metapneumovirus 08/13/2023 NOT DETECTED  NOT DETECTED Final   Rhinovirus / Enterovirus 08/13/2023  NOT DETECTED  NOT DETECTED Final   Influenza A 08/13/2023 NOT DETECTED  NOT DETECTED Final   Influenza B 08/13/2023 NOT DETECTED  NOT DETECTED Final   Parainfluenza Virus 1 08/13/2023 NOT DETECTED  NOT DETECTED Final   Parainfluenza Virus 2 08/13/2023 NOT DETECTED  NOT DETECTED Final   Parainfluenza Virus 3 08/13/2023 NOT DETECTED  NOT DETECTED Final   Parainfluenza Virus 4 08/13/2023 NOT DETECTED  NOT DETECTED Final   Respiratory Syncytial Virus 08/13/2023 NOT DETECTED  NOT DETECTED Final   Bordetella pertussis 08/13/2023 NOT DETECTED  NOT DETECTED Final   Bordetella Parapertussis 08/13/2023 NOT DETECTED  NOT DETECTED Final   Chlamydophila pneumoniae 08/13/2023 NOT DETECTED  NOT DETECTED Final   Mycoplasma pneumoniae 08/13/2023 NOT DETECTED  NOT DETECTED Final   Performed at [facility], [address] [place], [place] [address]   SARS Coronavirus 2 by RT PCR 08/14/2023 NEGATIVE  NEGATIVE Final   Comment: (NOTE) SARS-CoV-2 target nucleic acids are NOT DETECTED.  The SARS-CoV-2 RNA is generally detectable in upper and lower respiratory specimens during the acute phase of infection. The lowest concentration of SARS-CoV-2 viral copies this assay can detect is 250 copies / mL. A negative result does not preclude SARS-CoV-2 infection and should not be used as the sole basis for treatment or other patient management decisions.  A negative result may occur with improper specimen collection / handling, submission of specimen other than nasopharyngeal swab, presence of viral mutation(s) within the areas targeted by this assay, and inadequate number of viral copies (<250 copies / mL). A negative result must be combined with clinical observations, patient history, and epidemiological information.  Fact Sheet for Patients:   RoadLapTop.co.za  Fact Sheet for Healthcare Providers: http://kim-miller.com/  This test is not yet approved or                            cleared by the [place] FDA and has been authorized for detection and/or diagnosis of SARS-CoV-2 by FDA under an Emergency Use Authorization (EUA).  This EUA will remain in effect (meaning this test can be used) for the duration of the COVID-19 declaration under Section 564(b)(1) of the Act, 21 U.S.C. section 360bbb-3(b)(1), unless the authorization is terminated or revoked sooner.  Performed at [facility], [address] [place], [place] [address]   Admission on 08/08/2023, Discharged on 08/08/2023  Component Date Value Ref Range Status   Sodium 08/08/2023 137  135 - 145 mmol/L Final   Potassium 08/08/2023 3.9  3.5 - 5.1 mmol/L Final   Chloride 08/08/2023 107  98 - 111 mmol/L Final   CO2 08/08/2023 20 (L)  22 - 32 mmol/L Final   Glucose, Bld 08/08/2023 148 (H)  70 - 99 mg/dL Final   Glucose reference range applies only to samples taken after fasting for at least 8 hours.   BUN 08/08/2023 9  8 - 23 mg/dL Final   Creatinine, Ser 08/08/2023 1.43 (H)  0.61 - 1.24 mg/dL Final   Calcium 95/28/4132 8.5 (L)  8.9 - 10.3 mg/dL Final   Total Protein 44/11/270 7.1  6.5 - 8.1 g/dL Final   Albumin 53/66/4403 3.4 (L)  3.5 - 5.0 g/dL Final   AST 47/42/5956 25  15 - 41 U/L Final   ALT 08/08/2023 18  0 - 44 U/L Final   Alkaline Phosphatase 08/08/2023 76  38 - 126 U/L Final   Total Bilirubin 08/08/2023 0.7  0.3 - 1.2 mg/dL Final   GFR, Estimated 08/08/2023 53 (L)  >60  mL/min Final   Comment: (NOTE) Calculated using the CKD-EPI Creatinine Equation (2021)    Anion gap 08/08/2023 10  5 - 15 Final   Performed at [facility], [address] [place], [place] [address]   Alcohol, Ethyl (B) 08/08/2023 <10  <10 mg/dL Final   Comment: (NOTE) Lowest detectable limit for serum alcohol is 10 mg/dL.  For medical purposes only. Performed at [facility], [address] [place], [place] [address]    Salicylate Lvl 08/08/2023 <7.0 (L)  7.0 - 30.0 mg/dL  Final   Performed at [facility], [address], [place], [place] [address]   Acetaminophen (Tylenol), Serum 08/08/2023 <10 (L)  10 - 30 ug/mL Final   Comment: (NOTE) Therapeutic concentrations vary significantly. A range of 10-30 ug/mL  may be an effective concentration for many patients. However, some  are best treated at concentrations outside of this range. Acetaminophen concentrations >150 ug/mL at 4 hours after ingestion  and >50 ug/mL at 12 hours after ingestion are often associated with  toxic reactions.  Performed at [facility], [address], [place], [place] [address]    WBC 08/08/2023 9.9  4.0 - 10.5 K/uL Final   RBC 08/08/2023 4.58  4.22 - 5.81 MIL/uL Final   Hemoglobin 08/08/2023 14.0  13.0 - 17.0 g/dL Final   HCT 02/72/5366 44.0  39.0 - 52.0 % Final   MCV 08/08/2023 96.1  80.0 - 100.0 fL Final   MCH 08/08/2023 30.6  26.0 - 34.0 pg Final   MCHC 08/08/2023 31.8  30.0 - 36.0 g/dL Final   RDW 44/12/4740 12.6  11.5 - 15.5 % Final   Platelets 08/08/2023 249  150 - 400 K/uL Final   nRBC 08/08/2023 0.0  0.0 - 0.2 % Final   Performed at [facility], [address], [place], [place] [address]   Opiates 08/08/2023 NONE DETECTED  NONE DETECTED Final   Cocaine 08/08/2023 POSITIVE (A)  NONE DETECTED Final   Benzodiazepines 08/08/2023 NONE DETECTED  NONE DETECTED Final   Amphetamines 08/08/2023 NONE DETECTED  NONE DETECTED Final   Tetrahydrocannabinol 08/08/2023 NONE DETECTED  NONE DETECTED Final   Barbiturates 08/08/2023 NONE DETECTED  NONE DETECTED Final   Comment: (NOTE) DRUG SCREEN FOR MEDICAL PURPOSES ONLY.  IF CONFIRMATION IS NEEDED FOR ANY PURPOSE, NOTIFY LAB WITHIN 5 DAYS.  LOWEST DETECTABLE LIMITS FOR URINE DRUG SCREEN Drug Class                     Cutoff (ng/mL) Amphetamine and metabolites    1000 Barbiturate and metabolites    200 Benzodiazepine                 200 Opiates and metabolites        300 Cocaine and metabolites         300 THC                            50 Performed at [facility], [address], [place], [place] [address]    SARS Coronavirus 2 by RT PCR 08/08/2023 NEGATIVE  NEGATIVE Final   Performed at [facility], [address], [place], [place] [address]  Admission on 07/15/2023, Discharged on 07/17/2023  Component Date Value Ref Range Status   Sodium 07/15/2023 135  135 - 145 mmol/L Final   Potassium 07/15/2023 3.7  3.5 - 5.1  mmol/L Final   Chloride 07/15/2023 107  98 - 111 mmol/L Final   CO2 07/15/2023 19 (L)  22 - 32 mmol/L Final   Glucose, Bld 07/15/2023 129 (H)  70 - 99 mg/dL Final   Glucose reference range applies only to samples taken after fasting for at least 8 hours.   BUN 07/15/2023 11  8 - 23 mg/dL Final   Creatinine, Ser 07/15/2023 1.39 (H)  0.61 - 1.24 mg/dL Final   Calcium 40/98/1191 8.4 (L)  8.9 - 10.3 mg/dL Final   Total Protein 47/82/9562 6.9  6.5 - 8.1 g/dL Final   Albumin 13/06/6577 3.6  3.5 - 5.0 g/dL Final   AST 46/96/2952 21  15 - 41 U/L Final   ALT 07/15/2023 17  0 - 44 U/L Final   Alkaline Phosphatase 07/15/2023 64  38 - 126 U/L Final   Total Bilirubin 07/15/2023 0.5  0.3 - 1.2 mg/dL Final   GFR, Estimated 07/15/2023 55 (L)  >60 mL/min Final   Comment: (NOTE) Calculated using the CKD-EPI Creatinine Equation (2021)    Anion gap 07/15/2023 9  5 - 15 Final   Performed at [facility], [address] [place], [place] [address]   Alcohol, Ethyl (B) 07/15/2023 <10  <10 mg/dL Final   Comment: (NOTE) Lowest detectable limit for serum alcohol is 10 mg/dL.  For medical purposes only. Performed at [facility], [address], [place], [place] [address]    Salicylate Lvl 07/15/2023 <7.0 (L)  7.0 - 30.0 mg/dL Final   Performed at [facility], [address], [place], [place] [address]   Acetaminophen (Tylenol), Serum 07/15/2023 <10 (L)  10 - 30 ug/mL Final   Comment: (NOTE) Therapeutic concentrations vary significantly. A range of 10-30  ug/mL  may be an effective concentration for many patients. However, some  are best treated at concentrations outside of this range. Acetaminophen concentrations >150 ug/mL at 4 hours after ingestion  and >50 ug/mL at 12 hours after ingestion are often associated with  toxic reactions.  Performed at [facility], [address] [place], [place] [address]    WBC 07/15/2023 9.1  4.0 - 10.5 K/uL Final   RBC 07/15/2023 4.20 (L)  4.22 - 5.81 MIL/uL Final   Hemoglobin 07/15/2023 13.0  13.0 - 17.0 g/dL Final   HCT 34/74/2595 40.5  39.0 - 52.0 % Final   MCV 07/15/2023 96.4  80.0 - 100.0 fL Final   MCH 07/15/2023 31.0  26.0 - 34.0 pg Final   MCHC 07/15/2023 32.1  30.0 - 36.0 g/dL Final   RDW 63/87/5643 12.6  11.5 - 15.5 % Final   Platelets 07/15/2023 267  150 - 400 K/uL Final   nRBC 07/15/2023 0.0  0.0 - 0.2 % Final   Performed at [facility], [address], [place], [place] [address]   Opiates 07/15/2023 NONE DETECTED  NONE DETECTED Final   Cocaine 07/15/2023 POSITIVE (A)  NONE DETECTED Final   Benzodiazepines 07/15/2023 NONE DETECTED  NONE DETECTED Final   Amphetamines 07/15/2023 NONE DETECTED  NONE DETECTED Final   Tetrahydrocannabinol 07/15/2023 POSITIVE (A)  NONE DETECTED Final   Barbiturates 07/15/2023 NONE DETECTED  NONE DETECTED Final   Comment: (NOTE) DRUG SCREEN FOR MEDICAL PURPOSES ONLY.  IF CONFIRMATION IS NEEDED FOR ANY PURPOSE, NOTIFY LAB WITHIN 5 DAYS.  LOWEST DETECTABLE LIMITS FOR URINE DRUG SCREEN Drug Class  Cutoff (ng/mL) Amphetamine and metabolites    1000 Barbiturate and metabolites    200 Benzodiazepine                 200 Opiates and metabolites        300 Cocaine and metabolites        300 THC                            50 Performed at [facility], [address], [place], [place] [address]    Color, Urine 07/15/2023 YELLOW  YELLOW Final   APPearance 07/15/2023 CLEAR  CLEAR Final   Specific Gravity, Urine  07/15/2023 >1.030 (H)  1.005 - 1.030 Final   pH 07/15/2023 6.0  5.0 - 8.0 Final   Glucose, UA 07/15/2023 NEGATIVE  NEGATIVE mg/dL Final   Hgb urine dipstick 07/15/2023 NEGATIVE  NEGATIVE Final   Bilirubin Urine 07/15/2023 NEGATIVE  NEGATIVE Final   Ketones, ur 07/15/2023 NEGATIVE  NEGATIVE mg/dL Final   Protein, ur 60/45/4098 NEGATIVE  NEGATIVE mg/dL Final   Nitrite 11/91/4782 NEGATIVE  NEGATIVE Final   Leukocytes,Ua 07/15/2023 NEGATIVE  NEGATIVE Final   Comment: Microscopic not done on urines with negative protein, blood, leukocytes, nitrite, or glucose < 500 mg/dL. Performed at [facility], [address] [place], [place] [address]    SARS Coronavirus 2 by RT PCR 07/16/2023 NEGATIVE  NEGATIVE Final   Performed at [facility], [address] [place], [place] [address]  Admission on 07/12/2023, Discharged on 07/12/2023  Component Date Value Ref Range Status   Troponin I (High Sensitivity) 07/12/2023 7  <18 ng/L Final   Comment: (NOTE) Elevated high sensitivity troponin I (hsTnI) values and significant  changes across serial measurements may suggest ACS but many other  chronic and acute conditions are known to elevate hsTnI results.  Refer to the "Links" section for chest pain algorithms and additional  guidance. Performed at [facility], [address], [place], [place] [address]    Sodium 07/12/2023 136  135 - 145 mmol/L Final   Potassium 07/12/2023 4.1  3.5 - 5.1 mmol/L Final   Chloride 07/12/2023 106  98 - 111 mmol/L Final   CO2 07/12/2023 19 (L)  22 - 32 mmol/L Final   Glucose, Bld 07/12/2023 117 (H)  70 - 99 mg/dL Final   Glucose reference range applies only to samples taken after fasting for at least 8 hours.   BUN 07/12/2023 10  8 - 23 mg/dL Final   Creatinine, Ser 07/12/2023 1.17  0.61 - 1.24 mg/dL Final   Calcium 62/95/2841 8.2 (L)  8.9 - 10.3 mg/dL Final   Total Protein 32/44/0102 6.6  6.5 - 8.1 g/dL Final   Albumin 72/53/6644 3.3 (L)  3.5 - 5.0  g/dL Final   AST 03/47/4259 22  15 - 41 U/L Final   ALT 07/12/2023 16  0 - 44 U/L Final   Alkaline Phosphatase 07/12/2023 62  38 - 126 U/L Final   Total Bilirubin 07/12/2023 0.6  0.3 - 1.2 mg/dL Final   GFR, Estimated 07/12/2023 >60  >60 mL/min Final   Comment: (NOTE) Calculated using the CKD-EPI Creatinine Equation (2021)    Anion gap 07/12/2023 11  5 - 15 Final   Performed at [facility], [address] [place], [place] [address]   Alcohol, Ethyl (B) 07/12/2023 <10  <10 mg/dL Final   Comment: (NOTE) Lowest  detectable limit for serum alcohol is 10 mg/dL.  For medical purposes only. Performed at [facility], [address], [place], [place] [address]    Salicylate Lvl 07/12/2023 <7.0 (L)  7.0 - 30.0 mg/dL Final   Performed at [facility], [address], [place], [place] [address]   Acetaminophen (Tylenol), Serum 07/12/2023 <10 (L)  10 - 30 ug/mL Final   Comment: (NOTE) Therapeutic concentrations vary significantly. A range of 10-30 ug/mL  may be an effective concentration for many patients. However, some  are best treated at concentrations outside of this range. Acetaminophen concentrations >150 ug/mL at 4 hours after ingestion  and >50 ug/mL at 12 hours after ingestion are often associated with  toxic reactions.  Performed at [facility], [address], [place], [place] [address]    WBC 07/12/2023 7.9  4.0 - 10.5 K/uL Final   RBC 07/12/2023 4.43  4.22 - 5.81 MIL/uL Final   Hemoglobin 07/12/2023 13.3  13.0 - 17.0 g/dL Final   HCT 56/21/3086 41.7  39.0 - 52.0 % Final   MCV 07/12/2023 94.1  80.0 - 100.0 fL Final   MCH 07/12/2023 30.0  26.0 - 34.0 pg Final   MCHC 07/12/2023 31.9  30.0 - 36.0 g/dL Final   RDW 57/84/6962 12.5  11.5 - 15.5 % Final   Platelets 07/12/2023 255  150 - 400 K/uL Final   nRBC 07/12/2023 0.0  0.0 - 0.2 % Final   Performed at [facility], [address], [place], [place] [address]   Opiates 07/12/2023 NONE DETECTED   NONE DETECTED Final   Cocaine 07/12/2023 POSITIVE (A)  NONE DETECTED Final   Benzodiazepines 07/12/2023 NONE DETECTED  NONE DETECTED Final   Amphetamines 07/12/2023 NONE DETECTED  NONE DETECTED Final   Tetrahydrocannabinol 07/12/2023 POSITIVE (A)  NONE DETECTED Final   Barbiturates 07/12/2023 NONE DETECTED  NONE DETECTED Final   Comment: (NOTE) DRUG SCREEN FOR MEDICAL PURPOSES ONLY.  IF CONFIRMATION IS NEEDED FOR ANY PURPOSE, NOTIFY LAB WITHIN 5 DAYS.  LOWEST DETECTABLE LIMITS FOR URINE DRUG SCREEN Drug Class                     Cutoff (ng/mL) Amphetamine and metabolites    1000 Barbiturate and metabolites    200 Benzodiazepine                 200 Opiates and metabolites        300 Cocaine and metabolites        300 THC                            50 Performed at [facility], [address], [place], [place] [address]    Troponin I (High Sensitivity) 07/12/2023 7  <18 ng/L Final   Comment: (NOTE) Elevated high sensitivity troponin I (hsTnI) values and significant  changes across serial measurements may suggest ACS but many other  chronic and acute conditions are known to elevate hsTnI results.  Refer to the "Links" section for chest pain algorithms and additional  guidance. Performed at [facility], [address] [place], [place] [address]   Admission on 06/01/2023, Discharged on 06/01/2023  Component Date Value Ref Range Status   Fecal Occult Bld 06/01/2023 POSITIVE (A)  NEGATIVE Final   Sodium 06/01/2023 135  135 - 145 mmol/L Final   Potassium 06/01/2023 4.0  3.5 -  5.1 mmol/L Final   Chloride 06/01/2023 102  98 - 111 mmol/L Final   CO2 06/01/2023 22  22 - 32 mmol/L Final   Glucose, Bld 06/01/2023 100 (H)  70 - 99 mg/dL Final   Glucose reference range applies only to samples taken after fasting for at least 8 hours.   BUN 06/01/2023 13  8 - 23 mg/dL Final   Creatinine, Ser 06/01/2023 1.35 (H)  0.61 - 1.24 mg/dL Final   Calcium 93/23/5573 8.6 (L)  8.9 -  10.3 mg/dL Final   Total Protein 22/12/5425 6.8  6.5 - 8.1 g/dL Final   Albumin 04/24/7627 3.5  3.5 - 5.0 g/dL Final   AST 31/51/7616 26  15 - 41 U/L Final   ALT 06/01/2023 18  0 - 44 U/L Final   Alkaline Phosphatase 06/01/2023 65  38 - 126 U/L Final   Total Bilirubin 06/01/2023 0.8  0.3 - 1.2 mg/dL Final   GFR, Estimated 06/01/2023 57 (L)  >60 mL/min Final   Comment: (NOTE) Calculated using the CKD-EPI Creatinine Equation (2021)    Anion gap 06/01/2023 11  5 - 15 Final   Performed at [facility], [address], [place], [place] [address]   WBC 06/01/2023 11.7 (H)  4.0 - 10.5 K/uL Final   RBC 06/01/2023 3.94 (L)  4.22 - 5.81 MIL/uL Final   Hemoglobin 06/01/2023 12.2 (L)  13.0 - 17.0 g/dL Final   HCT 04/26/9484 37.8 (L)  39.0 - 52.0 % Final   MCV 06/01/2023 95.9  80.0 - 100.0 fL Final   MCH 06/01/2023 31.0  26.0 - 34.0 pg Final   MCHC 06/01/2023 32.3  30.0 - 36.0 g/dL Final   RDW 46/27/0350 12.4  11.5 - 15.5 % Final   Platelets 06/01/2023 238  150 - 400 K/uL Final   nRBC 06/01/2023 0.0  0.0 - 0.2 % Final   Neutrophils Relative % 06/01/2023 75  % Final   Neutro Abs 06/01/2023 8.7 (H)  1.7 - 7.7 K/uL Final   Lymphocytes Relative 06/01/2023 16  % Final   Lymphs Abs 06/01/2023 1.9  0.7 - 4.0 K/uL Final   Monocytes Relative 06/01/2023 7  % Final   Monocytes Absolute 06/01/2023 0.9  0.1 - 1.0 K/uL Final   Eosinophils Relative 06/01/2023 0  % Final   Eosinophils Absolute 06/01/2023 0.0  0.0 - 0.5 K/uL Final   Basophils Relative 06/01/2023 1  % Final   Basophils Absolute 06/01/2023 0.1  0.0 - 0.1 K/uL Final   Immature Granulocytes 06/01/2023 1  % Final   Abs Immature Granulocytes 06/01/2023 0.10 (H)  0.00 - 0.07 K/uL Final   Performed at [facility], [address], [place], [place] [address]   Prothrombin Time 06/01/2023 13.7  11.4 - 15.2 seconds Final   INR 06/01/2023 1.0  0.8 - 1.2 Final   Comment: (NOTE) INR goal varies based on device and disease states. Performed  at [facility], [address], [place], [place] [address]    ABO/RH(D) 06/01/2023 O POS   Final   Antibody Screen 06/01/2023 NEG   Final   Sample Expiration 06/01/2023    Final                   Value:06/04/2023,2359 Performed at [facility], [address] [place], [place] [address]    ABO/RH(D) 06/01/2023    Final  Value:O POS Performed at [facility], [address], [place], [place] [address]   Admission on 05/24/2023, Discharged on 05/25/2023  Component Date Value Ref Range Status   Sodium 05/24/2023 139  135 - 145 mmol/L Final   Potassium 05/24/2023 3.5  3.5 - 5.1 mmol/L Final   Chloride 05/24/2023 105  98 - 111 mmol/L Final   CO2 05/24/2023 21 (L)  22 - 32 mmol/L Final   Glucose, Bld 05/24/2023 117 (H)  70 - 99 mg/dL Final   Glucose reference range applies only to samples taken after fasting for at least 8 hours.   BUN 05/24/2023 7 (L)  8 - 23 mg/dL Final   Creatinine, Ser 05/24/2023 1.10  0.61 - 1.24 mg/dL Final   Calcium 60/45/4098 8.7 (L)  8.9 - 10.3 mg/dL Final   Total Protein 11/91/4782 6.9  6.5 - 8.1 g/dL Final   Albumin 95/62/1308 3.6  3.5 - 5.0 g/dL Final   AST 65/78/4696 19  15 - 41 U/L Final   ALT 05/24/2023 14  0 - 44 U/L Final   Alkaline Phosphatase 05/24/2023 62  38 - 126 U/L Final   Total Bilirubin 05/24/2023 0.7  0.3 - 1.2 mg/dL Final   GFR, Estimated 05/24/2023 >60  >60 mL/min Final   Comment: (NOTE) Calculated using the CKD-EPI Creatinine Equation (2021)    Anion gap 05/24/2023 13  5 - 15 Final   Performed at [facility], [address] [place], [place] [address]   Alcohol, Ethyl (B) 05/24/2023 18 (H)  <10 mg/dL Final   Comment: (NOTE) Lowest detectable limit for serum alcohol is 10 mg/dL.  For medical purposes only. Performed at [facility], [address] [place], [place] [address]    Salicylate Lvl 05/24/2023 <7.0 (L)  7.0 - 30.0 mg/dL Final   Performed at [facility], [address] [place], [place] [address]   Acetaminophen (Tylenol), Serum 05/24/2023 <10 (L)  10 - 30 ug/mL Final   Comment: (NOTE) Therapeutic concentrations vary significantly. A range of 10-30 ug/mL  may be an effective concentration for many patients. However, some  are best treated at concentrations outside of this range. Acetaminophen concentrations >150 ug/mL at 4 hours after ingestion  and >50 ug/mL at 12 hours after ingestion are often associated with  toxic reactions.  Performed at [facility], [address] [place], [place] [address]    WBC 05/24/2023 7.7  4.0 - 10.5 K/uL Final   RBC 05/24/2023 4.01 (L)  4.22 - 5.81 MIL/uL Final   Hemoglobin 05/24/2023 12.5 (L)  13.0 - 17.0 g/dL Final   HCT 64/40/3474 38.3 (L)  39.0 - 52.0 % Final   MCV 05/24/2023 95.5  80.0 - 100.0 fL Final   MCH 05/24/2023 31.2  26.0 - 34.0 pg Final   MCHC 05/24/2023 32.6  30.0 - 36.0 g/dL Final   RDW 25/95/6387 12.7  11.5 - 15.5 % Final   Platelets 05/24/2023 255  150 - 400 K/uL Final   nRBC 05/24/2023 0.0  0.0 - 0.2 % Final   Performed at [facility], [address] [place], [place] [address]   Opiates 05/24/2023 NONE DETECTED  NONE DETECTED Final   Cocaine 05/24/2023 POSITIVE (A)  NONE DETECTED Final   Benzodiazepines 05/24/2023 NONE DETECTED  NONE DETECTED Final   Amphetamines 05/24/2023 NONE DETECTED  NONE DETECTED Final   Tetrahydrocannabinol 05/24/2023 NONE DETECTED  NONE DETECTED Final   Barbiturates  05/24/2023 NONE DETECTED  NONE DETECTED Final   Comment: (NOTE) DRUG SCREEN FOR MEDICAL PURPOSES ONLY.  IF CONFIRMATION IS NEEDED FOR ANY PURPOSE, NOTIFY LAB WITHIN 5 DAYS.  LOWEST DETECTABLE LIMITS FOR URINE DRUG SCREEN Drug Class                     Cutoff (ng/mL) Amphetamine and metabolites    1000 Barbiturate and metabolites    200 Benzodiazepine                 200 Opiates and metabolites        300 Cocaine and metabolites        300 THC                            50 Performed  at [facility], [address], [place], [place] [address]    Troponin I (High Sensitivity) 05/24/2023 14  <18 ng/L Final   Comment: (NOTE) Elevated high sensitivity troponin I (hsTnI) values and significant  changes across serial measurements may suggest ACS but many other  chronic and acute conditions are known to elevate hsTnI results.  Refer to the "Links" section for chest pain algorithms and additional  guidance. Performed at [facility], [address] [place], [place] [address]    Troponin I (High Sensitivity) 05/24/2023 13  <18 ng/L Final   Comment: (NOTE) Elevated high sensitivity troponin I (hsTnI) values and significant  changes across serial measurements may suggest ACS but many other  chronic and acute conditions are known to elevate hsTnI results.  Refer to the "Links" section for chest pain algorithms and additional  guidance. Performed at [facility], [address] [place], [place] [address]   Admission on 04/05/2023, Discharged on 04/06/2023  Component Date Value Ref Range Status   Acetaminophen (Tylenol), Serum 04/05/2023 <10 (L)  10 - 30 ug/mL Final   Comment: (NOTE) Therapeutic concentrations vary significantly. A range of 10-30 ug/mL  may be an effective concentration for many patients. However, some  are best treated at concentrations outside of this range. Acetaminophen concentrations >150 ug/mL at 4 hours after ingestion  and >50 ug/mL at 12 hours after ingestion are often associated with  toxic reactions.  Performed at [facility], [address] [place], [place] [address]    WBC 04/05/2023 11.2 (H)  4.0 - 10.5 K/uL Final   RBC 04/05/2023 4.39  4.22 - 5.81 MIL/uL Final   Hemoglobin 04/05/2023 13.3  13.0 - 17.0 g/dL Final   HCT 29/56/2130 42.6  39.0 - 52.0 % Final   MCV 04/05/2023 97.0  80.0 - 100.0 fL Final   MCH 04/05/2023 30.3  26.0 - 34.0 pg Final   MCHC 04/05/2023 31.2  30.0 - 36.0 g/dL Final   RDW 86/57/8469  13.0  11.5 - 15.5 % Final   Platelets 04/05/2023 276  150 - 400 K/uL Final   nRBC 04/05/2023 0.0  0.0 - 0.2 % Final   Neutrophils Relative % 04/05/2023 61  % Final   Neutro Abs 04/05/2023 6.9  1.7 - 7.7 K/uL Final   Lymphocytes Relative 04/05/2023 26  % Final   Lymphs Abs 04/05/2023 2.9  0.7 - 4.0 K/uL Final   Monocytes Relative 04/05/2023 9  % Final   Monocytes Absolute 04/05/2023 1.0  0.1 - 1.0 K/uL Final   Eosinophils Relative 04/05/2023 3  % Final   Eosinophils  Absolute 04/05/2023 0.3  0.0 - 0.5 K/uL Final   Basophils Relative 04/05/2023 1  % Final   Basophils Absolute 04/05/2023 0.1  0.0 - 0.1 K/uL Final   Immature Granulocytes 04/05/2023 0  % Final   Abs Immature Granulocytes 04/05/2023 0.03  0.00 - 0.07 K/uL Final   Performed at [facility], [address], [place], [place] [address]   Sodium 04/05/2023 140  135 - 145 mmol/L Final   Potassium 04/05/2023 4.1  3.5 - 5.1 mmol/L Final   Chloride 04/05/2023 106  98 - 111 mmol/L Final   CO2 04/05/2023 25  22 - 32 mmol/L Final   Glucose, Bld 04/05/2023 104 (H)  70 - 99 mg/dL Final   Glucose reference range applies only to samples taken after fasting for at least 8 hours.   BUN 04/05/2023 15  8 - 23 mg/dL Final   Creatinine, Ser 04/05/2023 1.59 (H)  0.61 - 1.24 mg/dL Final   Calcium 19/14/7829 9.0  8.9 - 10.3 mg/dL Final   Total Protein 56/21/3086 7.2  6.5 - 8.1 g/dL Final   Albumin 57/84/6962 3.5  3.5 - 5.0 g/dL Final   AST 95/28/4132 22  15 - 41 U/L Final   ALT 04/05/2023 14  0 - 44 U/L Final   Alkaline Phosphatase 04/05/2023 83  38 - 126 U/L Final   Total Bilirubin 04/05/2023 0.6  0.3 - 1.2 mg/dL Final   GFR, Estimated 04/05/2023 47 (L)  >60 mL/min Final   Comment: (NOTE) Calculated using the CKD-EPI Creatinine Equation (2021)    Anion gap 04/05/2023 9  5 - 15 Final   Performed at [facility], [address] [place], [place] [address]   Alcohol, Ethyl (B) 04/05/2023 <10  <10 mg/dL Final   Comment: (NOTE) Lowest  detectable limit for serum alcohol is 10 mg/dL.  For medical purposes only. Performed at [facility], [address] [place], [place] [address]    Opiates 04/05/2023 NONE DETECTED  NONE DETECTED Final   Cocaine 04/05/2023 POSITIVE (A)  NONE DETECTED Final   Benzodiazepines 04/05/2023 NONE DETECTED  NONE DETECTED Final   Amphetamines 04/05/2023 NONE DETECTED  NONE DETECTED Final   Tetrahydrocannabinol 04/05/2023 NONE DETECTED  NONE DETECTED Final   Barbiturates 04/05/2023 NONE DETECTED  NONE DETECTED Final   Comment: (NOTE) DRUG SCREEN FOR MEDICAL PURPOSES ONLY.  IF CONFIRMATION IS NEEDED FOR ANY PURPOSE, NOTIFY LAB WITHIN 5 DAYS.  LOWEST DETECTABLE LIMITS FOR URINE DRUG SCREEN Drug Class                     Cutoff (ng/mL) Amphetamine and metabolites    1000 Barbiturate and metabolites    200 Benzodiazepine                 200 Opiates and metabolites        300 Cocaine and metabolites        300 THC                            50 Performed at [facility], [address], [place], [place] [address]    Salicylate Lvl 04/05/2023 <7.0 (L)  7.0 - 30.0 mg/dL Final   Performed at [facility], [address], [place], [place] [address]   Troponin I (High Sensitivity) 04/05/2023 12  <18 ng/L Final   Comment: (NOTE) Elevated high sensitivity troponin I (hsTnI)  values and significant  changes across serial measurements may suggest ACS but many other  chronic and acute conditions are known to elevate hsTnI results.  Refer to the "Links" section for chest pain algorithms and additional  guidance. Performed at [facility], [address] [place], [place] [address]    Troponin I (High Sensitivity) 04/05/2023 12  <18 ng/L Final   Comment: (NOTE) Elevated high sensitivity troponin I (hsTnI) values and significant  changes across serial measurements may suggest ACS but many other  chronic and acute conditions are known to elevate hsTnI results.  Refer to the  "Links" section for chest pain algorithms and additional  guidance. Performed at [facility], [address], [place], [place] [address]    SARS Coronavirus 2 by RT PCR 04/05/2023 NEGATIVE  NEGATIVE Final   Performed at [facility], [address] [place], [place] [address]  [facility] on 03/22/2023  Component Date Value Ref Range Status   POC Glucose 03/22/2023 140 (A)  70 - 99 mg/dl Final    Allergies: Shellfish allergy and Shellfish-derived products  Medications:  Facility Ordered Medications  Medication   acetaminophen (TYLENOL) tablet 650 mg   alum & mag hydroxide-simeth (MAALOX/MYLANTA) 200-200-20 MG/5ML suspension 30 mL   magnesium hydroxide (MILK OF MAGNESIA) suspension 30 mL   hydrOXYzine (ATARAX) tablet 50 mg   traZODone (DESYREL) tablet 50 mg   [START ON 09/16/2023] nicotine (NICODERM CQ - dosed in mg/24 hours) patch 21 mg   [COMPLETED] thiamine (VITAMIN B1) injection 100 mg   [START ON 09/16/2023] thiamine (VITAMIN B1) tablet 100 mg   multivitamin with minerals tablet 1 tablet   LORazepam (ATIVAN) tablet 1 mg   loperamide (IMODIUM) capsule 2-4 mg   ondansetron (ZOFRAN-ODT) disintegrating tablet 4 mg   LORazepam (ATIVAN) tablet 1 mg   Followed by   Melene Muller ON 09/17/2023] LORazepam (ATIVAN) tablet 1 mg   Followed by   Melene Muller ON 09/18/2023] LORazepam (ATIVAN) tablet 1 mg   Followed by   Melene Muller ON 09/19/2023] LORazepam (ATIVAN) tablet 1 mg   cloNIDine (CATAPRES) tablet 0.1 mg   albuterol (VENTOLIN HFA) 108 (90 Base) MCG/ACT inhaler 2 puff   diltiazem (CARDIZEM CD) 24 hr capsule 180 mg   atorvastatin (LIPITOR) tablet 80 mg   mometasone-formoterol (DULERA) 200-5 MCG/ACT inhaler 2 puff   ARIPiprazole (ABILIFY) tablet 5 mg   PTA Medications  Medication Sig   atorvastatin (LIPITOR) 80 MG tablet Take 1 tablet (80 mg total) by mouth daily.   diltiazem (CARDIZEM CD) 180 MG 24 hr capsule Take 1 capsule (180 mg total) by mouth daily.    ARIPiprazole (ABILIFY) 5 MG tablet Take 1 tablet (5 mg total) by mouth daily.   doxepin (SINEQUAN) 75 MG capsule Take 1 capsule (75 mg total) by mouth at bedtime.   hydrOXYzine (ATARAX) 50 MG tablet Take 1 tablet (50 mg total) by mouth 3 (three) times daily as needed for anxiety.   traZODone (DESYREL) 50 MG tablet Take 1 tablet (50 mg total) by mouth at bedtime as needed for sleep.   albuterol (VENTOLIN HFA) 108 (90 Base) MCG/ACT inhaler Inhale 2 puffs into the lungs every 6 (six) hours as needed for shortness of breath.   mometasone-formoterol (DULERA) 200-5 MCG/ACT AERO Inhale 2 puffs into the lungs 2 (two) times daily.   latanoprost (XALATAN) 0.005 % ophthalmic solution Place 1 drop into both eyes at bedtime.    Screenings    Flowsheet Row Most Recent  Value  CIWA-Ar Total 2       Medical Decision Making  1. Alcohol-induced mood disorder (HCC) - Full code; Standing - Diet regular Room service appropriate? Yes; Fluid consistency: Thin; Standing - acetaminophen (TYLENOL) tablet 650 mg - alum & mag hydroxide-simeth (MAALOX/MYLANTA) 200-200-20 MG/5ML suspension 30 mL - magnesium hydroxide (MILK OF MAGNESIA) suspension 30 mL - Place in continuous assessment [facility]; Standing - Voluntary Treatment; Standing - Vital signs; Standing - Activity as tolerated; Standing - CBC with Differential/Platelet; Standing - Comprehensive metabolic panel; Standing - Ethanol; Standing - POCT Urine Drug Screen - (I-Screen); Standing - EKG 12-Lead; Standing - hydrOXYzine (ATARAX) tablet 50 mg - Full code - Diet regular Room service appropriate? Yes; Fluid consistency: Thin - Place in continuous assessment [facility] - Voluntary Treatment - Vital signs - Activity as tolerated - CBC with Differential/Platelet - Comprehensive metabolic panel - Ethanol - POCT Urine Drug Screen - (I-Screen) - EKG 12-Lead - Vital signs while awake x 48 hours, then BID x 48 hours, then per unit protocol; Standing - Clinical  institute withdrawal assessment while awake x 48 hours, then BID x 48 hours, then Daily x 48 hours then discontinue; Standing - thiamine (VITAMIN B1) injection 100 mg - thiamine (VITAMIN B1) tablet 100 mg - multivitamin with minerals tablet 1 tablet - LORazepam (ATIVAN) tablet 1 mg - loperamide (IMODIUM) capsule 2-4 mg - ondansetron (ZOFRAN-ODT) disintegrating tablet 4 mg - LORazepam (ATIVAN) tablet 1 mg - LORazepam (ATIVAN) tablet 1 mg - LORazepam (ATIVAN) tablet 1 mg - LORazepam (ATIVAN) tablet 1 mg - Vital signs while awake x 48 hours, then BID x 48 hours, then per unit protocol - Clinical institute withdrawal assessment while awake x 48 hours, then BID x 48 hours, then Daily x 48 hours then discontinue - cloNIDine (CATAPRES) tablet 0.1 mg  2. Cocaine use disorder (HCC) - CBC with Differential/Platelet; Standing - Comprehensive metabolic panel; Standing - POCT Urine Drug Screen - (I-Screen); Standing - EKG 12-Lead; Standing - hydrOXYzine (ATARAX) tablet 50 mg - CBC with Differential/Platelet - Comprehensive metabolic panel - POCT Urine Drug Screen - (I-Screen) - EKG 12-Lead  3. Moderate episode of recurrent major depressive disorder (HCC) - EKG 12-Lead; Standing - hydrOXYzine (ATARAX) tablet 50 mg - EKG 12-Lead - ARIPiprazole (ABILIFY) tablet 5 mg  4. Essential hypertension - CBC with Differential/Platelet; Standing - Comprehensive metabolic panel; Standing - Hemoglobin A1c; Standing - Ethanol; Standing - Lipid panel; Standing - TSH; Standing - EKG 12-Lead; Standing - CBC with Differential/Platelet - Comprehensive metabolic panel - Hemoglobin A1c - Ethanol - Lipid panel - TSH - EKG 12-Lead - diltiazem (CARDIZEM CD) 24 hr capsule 180 mg  5. Tobacco use disorder - nicotine (NICODERM CQ - dosed in mg/24 hours) patch 21 mg  6. COPD with chronic bronchitis (HCC) - albuterol (VENTOLIN HFA) 108 (90 Base) MCG/ACT inhaler 2 puff - mometasone-formoterol (DULERA) 200-5  MCG/ACT inhaler 2 puff  7. PAD (peripheral artery disease) (HCC) - atorvastatin (LIPITOR) tablet 80 mg      Recommendations  Based on my evaluation the patient does not appear to have an emergency medical condition. Patient will transfer to facility based crisis unit once bed availability within the next 24 to 48 hours. Joaquin Courts, NP 09/15/23  6:27 PM

## 2023-09-15 NOTE — Progress Notes (Signed)
   09/15/23 1326  [facility] Triage Screening (Walk-ins at [facility] only)  How Did You Hear About [place]? Self  What Is the Reason for Your Visit/Call Today? Pt presents to [facility] voluntarily unaccompanied. Pt states that he needs help with drugs and alcohol. Pt states he stopped for a year, but have been back on it for the last 2 years. Pt endorses SI a couple days ago without a plan, but denies SI at this present time. Pt states he had HI thoughts a couple months ago, but not currently. Pt denies AVH at this present time. Pt states he has a couple 40 oz beers and a gram of crack cocaine on yesterday.  How Long Has This Been Causing You Problems? > than 6 months  Have You Recently Had Any Thoughts About Hurting Yourself? Yes  How long ago did you have thoughts about hurting yourself? a couple days - no plan  Are You Planning to Commit Suicide/Harm Yourself At This time? No  Have you Recently Had Thoughts About Hurting Someone [name]? Yes  How long ago did you have thoughts of harming others? a couple months  Are You Planning To Harm Someone At This Time? No  Are you currently experiencing any auditory, visual or other hallucinations? No  Have You Used Any Alcohol or Drugs in the Past 24 Hours? Yes  How long ago did you use Drugs or Alcohol? yesterday  What Did You Use and How Much? beer - couple 40oz & a gram of crack cocaine  Do you have any current medical co-morbidities that require immediate attention? No  Clinician description of patient physical appearance/behavior: casually dressed, calm, cooperative  What Do You Feel Would Help You the Most Today? Alcohol or Drug Use Treatment  If access to [facility] was not available, would you have sought care in the Emergency Department? No  Determination of Need Routine (7 days)  Options For Referral Medication Management;Chemical Dependency Intensive Outpatient Therapy (CDIOP);Outpatient Therapy;Facility-Based Crisis

## 2023-09-16 ENCOUNTER — Other Ambulatory Visit (HOSPITAL_COMMUNITY)
Admission: EM | Admit: 2023-09-16 | Discharge: 2023-09-21 | Disposition: A | Discharge status: Home / Self Care | Payer: 59 | Attending: Psychiatry | Admitting: Psychiatry

## 2023-09-16 DIAGNOSIS — I739 Peripheral vascular disease, unspecified: Secondary | ICD-10-CM | POA: Insufficient documentation

## 2023-09-16 DIAGNOSIS — F331 Major depressive disorder, recurrent, moderate: Secondary | ICD-10-CM | POA: Diagnosis not present

## 2023-09-16 DIAGNOSIS — F1490 Cocaine use, unspecified, uncomplicated: Secondary | ICD-10-CM | POA: Insufficient documentation

## 2023-09-16 DIAGNOSIS — H269 Unspecified cataract: Secondary | ICD-10-CM | POA: Diagnosis not present

## 2023-09-16 DIAGNOSIS — F17200 Nicotine dependence, unspecified, uncomplicated: Secondary | ICD-10-CM

## 2023-09-16 DIAGNOSIS — I10 Essential (primary) hypertension: Secondary | ICD-10-CM | POA: Insufficient documentation

## 2023-09-16 DIAGNOSIS — F1094 Alcohol use, unspecified with alcohol-induced mood disorder: Secondary | ICD-10-CM

## 2023-09-16 DIAGNOSIS — F1014 Alcohol abuse with alcohol-induced mood disorder: Secondary | ICD-10-CM | POA: Insufficient documentation

## 2023-09-16 DIAGNOSIS — I2510 Atherosclerotic heart disease of native coronary artery without angina pectoris: Secondary | ICD-10-CM | POA: Insufficient documentation

## 2023-09-16 DIAGNOSIS — F1410 Cocaine abuse, uncomplicated: Secondary | ICD-10-CM

## 2023-09-16 DIAGNOSIS — I252 Old myocardial infarction: Secondary | ICD-10-CM | POA: Diagnosis not present

## 2023-09-16 DIAGNOSIS — T43206A Underdosing of unspecified antidepressants, initial encounter: Secondary | ICD-10-CM | POA: Insufficient documentation

## 2023-09-16 DIAGNOSIS — J4489 Other specified chronic obstructive pulmonary disease: Secondary | ICD-10-CM | POA: Diagnosis not present

## 2023-09-16 DIAGNOSIS — Z91128 Patient's intentional underdosing of medication regimen for other reason: Secondary | ICD-10-CM | POA: Insufficient documentation

## 2023-09-16 DIAGNOSIS — F17210 Nicotine dependence, cigarettes, uncomplicated: Secondary | ICD-10-CM | POA: Insufficient documentation

## 2023-09-16 DIAGNOSIS — E785 Hyperlipidemia, unspecified: Secondary | ICD-10-CM | POA: Diagnosis not present

## 2023-09-16 DIAGNOSIS — F1994 Other psychoactive substance use, unspecified with psychoactive substance-induced mood disorder: Secondary | ICD-10-CM

## 2023-09-16 DIAGNOSIS — F1090 Alcohol use, unspecified, uncomplicated: Secondary | ICD-10-CM

## 2023-09-16 MED ORDER — ADULT MULTIVITAMIN W/MINERALS CH
1.0000 | ORAL_TABLET | Freq: Every day | ORAL | Status: DC
  Administered 2023-09-17 – 2023-09-21 (×5): 1 via ORAL
  Filled 2023-09-16 (×5): qty 1

## 2023-09-16 MED ORDER — TRAZODONE HCL 50 MG PO TABS
50.0000 mg | ORAL_TABLET | Freq: Every evening | ORAL | Status: DC | PRN
  Administered 2023-09-16 – 2023-09-20 (×5): 50 mg via ORAL
  Filled 2023-09-16 (×5): qty 1

## 2023-09-16 MED ORDER — ONDANSETRON 4 MG PO TBDP: 4.0000 mg | ORAL_TABLET | Freq: Four times a day (QID) | ORAL | Status: AC | PRN

## 2023-09-16 MED ORDER — ACETAMINOPHEN 325 MG PO TABS
650.0000 mg | ORAL_TABLET | Freq: Four times a day (QID) | ORAL | Status: DC | PRN
  Administered 2023-09-18 – 2023-09-20 (×3): 650 mg via ORAL
  Filled 2023-09-16 (×3): qty 2

## 2023-09-16 MED ORDER — LORAZEPAM 1 MG PO TABS: 1.0000 mg | ORAL_TABLET | Freq: Four times a day (QID) | ORAL | Status: AC | PRN

## 2023-09-16 MED ORDER — ARIPIPRAZOLE 5 MG PO TABS
5.0000 mg | ORAL_TABLET | Freq: Every day | ORAL | Status: DC
  Administered 2023-09-17 – 2023-09-21 (×5): 5 mg via ORAL
  Filled 2023-09-16 (×5): qty 1

## 2023-09-16 MED ORDER — MOMETASONE FURO-FORMOTEROL FUM 200-5 MCG/ACT IN AERO
2.0000 | INHALATION_SPRAY | Freq: Two times a day (BID) | RESPIRATORY_TRACT | Status: DC
  Administered 2023-09-16 – 2023-09-21 (×10): 2 via RESPIRATORY_TRACT

## 2023-09-16 MED ORDER — DILTIAZEM HCL ER COATED BEADS 180 MG PO CP24
180.0000 mg | ORAL_CAPSULE | Freq: Every day | ORAL | Status: DC
  Administered 2023-09-17 – 2023-09-21 (×5): 180 mg via ORAL
  Filled 2023-09-16 (×5): qty 1

## 2023-09-16 MED ORDER — MAGNESIUM HYDROXIDE 400 MG/5ML PO SUSP
30.0000 mL | Freq: Every day | ORAL | Status: DC | PRN
  Administered 2023-09-18: 30 mL via ORAL
  Filled 2023-09-16: qty 30

## 2023-09-16 MED ORDER — THIAMINE MONONITRATE 100 MG PO TABS
100.0000 mg | ORAL_TABLET | Freq: Every day | ORAL | Status: DC
  Administered 2023-09-17 – 2023-09-21 (×5): 100 mg via ORAL
  Filled 2023-09-16 (×5): qty 1

## 2023-09-16 MED ORDER — NICOTINE 21 MG/24HR TD PT24
21.0000 mg | MEDICATED_PATCH | Freq: Every day | TRANSDERMAL | Status: DC
  Filled 2023-09-16 (×4): qty 1

## 2023-09-16 MED ORDER — ATORVASTATIN CALCIUM 40 MG PO TABS
80.0000 mg | ORAL_TABLET | Freq: Every day | ORAL | Status: DC
  Administered 2023-09-17 – 2023-09-21 (×5): 80 mg via ORAL
  Filled 2023-09-16 (×5): qty 2

## 2023-09-16 MED ORDER — LORAZEPAM 1 MG PO TABS: 1.0000 mg | ORAL_TABLET | Freq: Every day | ORAL | Status: DC

## 2023-09-16 MED ORDER — CLONIDINE HCL 0.1 MG PO TABS: 0.1000 mg | ORAL_TABLET | Freq: Two times a day (BID) | ORAL | Status: DC | PRN

## 2023-09-16 MED ORDER — LORAZEPAM 1 MG PO TABS: 1.0000 mg | ORAL_TABLET | Freq: Two times a day (BID) | ORAL | Status: DC

## 2023-09-16 MED ORDER — ALUM & MAG HYDROXIDE-SIMETH 200-200-20 MG/5ML PO SUSP: 30.0000 mL | ORAL | Status: DC | PRN

## 2023-09-16 MED ORDER — HYDROXYZINE HCL 25 MG PO TABS: 50.0000 mg | ORAL_TABLET | Freq: Three times a day (TID) | ORAL | Status: DC | PRN

## 2023-09-16 MED ORDER — LOPERAMIDE HCL 2 MG PO CAPS: 2.0000 mg | ORAL_CAPSULE | ORAL | Status: AC | PRN

## 2023-09-16 MED ORDER — ALBUTEROL SULFATE HFA 108 (90 BASE) MCG/ACT IN AERS
2.0000 | INHALATION_SPRAY | Freq: Four times a day (QID) | RESPIRATORY_TRACT | Status: DC | PRN
  Filled 2023-09-16: qty 6.7

## 2023-09-16 MED ORDER — LORAZEPAM 1 MG PO TABS: 1.0000 mg | ORAL_TABLET | Freq: Four times a day (QID) | ORAL | Status: DC

## 2023-09-16 MED ORDER — LORAZEPAM 1 MG PO TABS: 1.0000 mg | ORAL_TABLET | Freq: Three times a day (TID) | ORAL | Status: DC

## 2023-09-16 NOTE — ED Provider Notes (Signed)
Facility Based Crisis Admission H&P  Date: 09/16/23 Patient Name: Robert Chan MRN: [id number] Chief Complaint: "Drinking too much alcohol"  Diagnoses:  Final diagnoses:  Alcohol-induced mood disorder (HCC)  COPD with chronic bronchitis (HCC)  Moderate episode of recurrent major depressive disorder (HCC)  PAD (peripheral artery disease) (HCC)  Essential hypertension  Cocaine use disorder (HCC)  Tobacco use disorder   HPI:  Robert Chan is a 67 year old male with a past psychiatric history history of multiple inpatient hospitalizations (x3 2024, most recent in October), major depressive disorder, cocaine use disorder, alcohol use disorder and past medical history of COPD, hypertension, NSTEMI, CAD who presented to the behavioral health urgent care unaccompanied requesting detox from alcohol and cocaine.  Patient reported using alcohol in his early teens and drinking approximately 2 to 3 ounce bottles of alcohol per day.  He denied any history of withdrawal symptoms.  He reports that he has been drinking since his 23s.  He reports that his longest period of sobriety was for 1 year and reports that this is due to the area that he was living in.  He denies any legal consequences from his alcohol use. He also reported cocaine use.  He did not specify quantity of cocaine.  He denies other substance use.  He reports that he did rehab in [place] 8 to 9 years ago.  He reports that he came to the [facility] because he had a counselor who called him yesterday and advised him to come in.  Unable to identify which counselor patient was reporting based on patient's description.  With regards to past psychiatric history, patient reports a history of depression.  He denies any current depressive symptoms.  He reports a past psychiatric hospitalization in October 2020 for and a suicide attempt "a while ago."  Per chart review patient has had multiple inpatient hospitalizations for major depressive disorder  with suicidal ideations, he has had 3 within the past year.  He has had medication trials of Abilify, doxepin, Remeron, Wellbutrin.  He has been noncompliant with medications after previous discharges and with outpatient follow-up.  Patient reports that he lives by himself.  He reports that he supports himself with disability check from [employer].  He reports that he lives in an apartment in [place].  He reports that he has been separated from his wife for a while.  He reports that he has a daughter in [place] and 6 grandchildren.  He reports that his family is in [place].  PHQ 2-9:  [employer] Visit from 05/11/2022 in [facility] - [facility] Of [place]. [facility] Office Visit from 01/11/2022 in [facility] [place] - A Dept Of [facility] Bridegroom. [facility] Office Visit from 09/28/2021 in [facility] [place] - A Dept Of [place] Bridegroom. [facility]  Thoughts that you would be better off dead, or of hurting yourself in some way Not at all Not at all Not at all  PHQ-9 Total Score 5 8 11        Flowsheet Row ED from 09/16/2023 in [facility] ED from 09/15/2023 in [facility] Admission (Discharged) from 08/08/2023 in [facility]  C-SSRS RISK CATEGORY No Risk No Risk High Risk         Musculoskeletal  Strength & Muscle Tone: within normal limits Gait & Station: normal Patient leans: N/A  Psychiatric Specialty Exam  Presentation General Appearance:  Appropriate for Environment  Eye Contact: Good  Speech: Clear and Coherent  Speech Volume: Normal  Handedness: Right   Mood and Affect  Mood: Hopeless  Affect: Congruent   Thought Process  Thought Processes: Coherent  Descriptions of Associations:Intact  Orientation:Full (Time, Place and Person)  Thought Content:Logical  Diagnosis of Schizophrenia or  Schizoaffective disorder in past: No   Hallucinations:Hallucinations: None  Ideas of Reference:None  Suicidal Thoughts:Suicidal Thoughts: No  Homicidal Thoughts:Homicidal Thoughts: No   Sensorium  Memory: Immediate Good; Recent Good; Remote Good  Judgment: Fair  Insight: Fair   Chartered certified accountant: Fair  Attention Span: Fair  Recall: [employer] of Knowledge: Fair  Language: Fair   Psychomotor Activity  Psychomotor Activity: Psychomotor Activity: Normal   Assets  Assets: Communication Skills; Desire for Improvement; Housing   Sleep  Sleep: Sleep: Fair Number of Hours of Sleep: 6   Nutritional Assessment (For OBS and FBC admissions only) Has the patient had a weight loss or gain of 10 pounds or more in the last 3 months?: No Has the patient had a decrease in food intake/or appetite?: No Does the patient have dental problems?: No Does the patient have eating habits or behaviors that may be indicators of an eating disorder including binging or inducing vomiting?: No Has the patient recently lost weight without trying?: 0 Has the patient been eating poorly because of a decreased appetite?: 0 Malnutrition Screening Tool Score: 0    Physical Exam HENT:     Head: Normocephalic and atraumatic.  Pulmonary:     Effort: Pulmonary effort is normal.  Neurological:     General: No focal deficit present.     Mental Status: He is alert.    Review of Systems  Constitutional: Negative.   Respiratory:  Negative for shortness of breath.   Cardiovascular:  Negative for chest pain.  Gastrointestinal: Negative.   Skin: Negative.   Neurological: Negative.     Blood pressure 121/85, pulse 76, temperature 97.9 F (36.6 C), temperature source Oral, resp. rate 20, SpO2 99%. There is no height or weight on file to calculate BMI.  Past Psychiatric History: Patient reports a history of depression. He reports a past psychiatric hospitalization in  October 2020 for and a suicide attempt "a while ago."  Per chart review patient has had multiple inpatient hospitalizations for major depressive disorder with suicidal ideations, he has had 3 within the past year.  He has had medication trials of Abilify, doxepin, Remeron, Wellbutrin.  He has been noncompliant with medications after previous discharges and with outpatient follow-up.   Is the patient at risk to self? No  Has the patient been a risk to self in the past 6 months? Yes .    Has the patient been a risk to self within the distant past? Yes   Is the patient a risk to others? No   Has the patient been a risk to others in the past 6 months? No   Has the patient been a risk to others within the distant past? No   Past Medical History: COPD, hypertension, NSTEMI, CAD Family History: Patient states that his 2 brothers died from alcohol and drug use.  Social History: he lives by himself.  He reports that he supports himself with disability check from [employer].  He reports that he lives in an apartment in [place].  He reports that he has been separated from his wife for a while.  He reports that he has a  daughter in [place] and 6 grandchildren.  He reports that his family is in [place]  Last Labs:  Admission on 09/15/2023, Discharged on 09/16/2023  Component Date Value Ref Range Status   WBC 09/15/2023 8.9  4.0 - 10.5 K/uL Final   RBC 09/15/2023 4.35  4.22 - 5.81 MIL/uL Final   Hemoglobin 09/15/2023 13.0  13.0 - 17.0 g/dL Final   HCT 82/95/6213 40.7  39.0 - 52.0 % Final   MCV 09/15/2023 93.6  80.0 - 100.0 fL Final   MCH 09/15/2023 29.9  26.0 - 34.0 pg Final   MCHC 09/15/2023 31.9  30.0 - 36.0 g/dL Final   RDW 08/65/7846 12.6  11.5 - 15.5 % Final   Platelets 09/15/2023 194  150 - 400 K/uL Final   nRBC 09/15/2023 0.0  0.0 - 0.2 % Final   Neutrophils Relative % 09/15/2023 52  % Final   Neutro Abs 09/15/2023 4.6  1.7 - 7.7 K/uL Final   Lymphocytes Relative 09/15/2023 36  %  Final   Lymphs Abs 09/15/2023 3.2  0.7 - 4.0 K/uL Final   Monocytes Relative 09/15/2023 7  % Final   Monocytes Absolute 09/15/2023 0.7  0.1 - 1.0 K/uL Final   Eosinophils Relative 09/15/2023 4  % Final   Eosinophils Absolute 09/15/2023 0.3  0.0 - 0.5 K/uL Final   Basophils Relative 09/15/2023 1  % Final   Basophils Absolute 09/15/2023 0.1  0.0 - 0.1 K/uL Final   Immature Granulocytes 09/15/2023 0  % Final   Abs Immature Granulocytes 09/15/2023 0.03  0.00 - 0.07 K/uL Final   Performed at [facility] Lab, [address] [place], [place] [address]   Sodium 09/15/2023 138  135 - 145 mmol/L Final   Potassium 09/15/2023 4.0  3.5 - 5.1 mmol/L Final   Chloride 09/15/2023 105  98 - 111 mmol/L Final   CO2 09/15/2023 24  22 - 32 mmol/L Final   Glucose, Bld 09/15/2023 90  70 - 99 mg/dL Final   Glucose reference range applies only to samples taken after fasting for at least 8 hours.   BUN 09/15/2023 6 (L)  8 - 23 mg/dL Final   Creatinine, Ser 09/15/2023 1.17  0.61 - 1.24 mg/dL Final   Calcium 28/41/3244 8.9  8.9 - 10.3 mg/dL Final   Total Protein 11/03/7251 7.1  6.5 - 8.1 g/dL Final   Albumin 66/44/0347 3.5  3.5 - 5.0 g/dL Final   AST 42/59/5638 19  15 - 41 U/L Final   ALT 09/15/2023 15  0 - 44 U/L Final   Alkaline Phosphatase 09/15/2023 70  38 - 126 U/L Final   Total Bilirubin 09/15/2023 0.8  <1.2 mg/dL Final   GFR, Estimated 09/15/2023 >60  >60 mL/min Final   Comment: (NOTE) Calculated using the CKD-EPI Creatinine Equation (2021)    Anion gap 09/15/2023 9  5 - 15 Final   Performed at [facility], [address], [place], [place] [address]   Hgb A1c MFr Bld 09/15/2023 5.1  4.8 - 5.6 % Final   Comment: (NOTE) Pre diabetes:          5.7%-6.4%  Diabetes:              >6.4%  Glycemic control for   <7.0% adults with diabetes    Mean Plasma Glucose 09/15/2023 99.67  mg/dL Final   Performed at [facility] Lab, [address] [place], [place] [address]   Alcohol, Ethyl (B)  09/15/2023 <10  <  10 mg/dL Final   Comment: (NOTE) Lowest detectable limit for serum alcohol is 10 mg/dL.  For medical purposes only. Performed at [facility], [address], [place], [place] [address]    Cholesterol 09/15/2023 159  0 - 200 mg/dL Final   Triglycerides 19/14/7829 53  <150 mg/dL Final   HDL 56/21/3086 61  >40 mg/dL Final   Total CHOL/HDL Ratio 09/15/2023 2.6  RATIO Final   VLDL 09/15/2023 11  0 - 40 mg/dL Final   LDL Cholesterol 09/15/2023 87  0 - 99 mg/dL Final   Comment:        Total Cholesterol/HDL:CHD Risk Coronary Heart Disease Risk Table                     Men   Women  1/2 Average Risk   3.4   3.3  Average Risk       5.0   4.4  2 X Average Risk   9.6   7.1  3 X Average Risk  23.4   11.0        Use the calculated Patient Ratio above and the CHD Risk Table to determine the patient's CHD Risk.        ATP III CLASSIFICATION (LDL):  <100     mg/dL   Optimal  578-469  mg/dL   Near or Above                    Optimal  130-159  mg/dL   Borderline  629-528  mg/dL   High  >413     mg/dL   Very High Performed at [facility], [address] [place], [place] [address]    TSH 09/15/2023 3.515  0.350 - 4.500 uIU/mL Final   Comment: Performed by a 3rd Generation assay with a functional sensitivity of <=0.01 uIU/mL. Performed at [facility], [address] [place], [place] [address]    POC Amphetamine UR 09/15/2023 None Detected  NONE DETECTED (Cut Off Level 1000 ng/mL) Final   POC Secobarbital (BAR) 09/15/2023 None Detected  NONE DETECTED (Cut Off Level 300 ng/mL) Final   POC Buprenorphine (BUP) 09/15/2023 None Detected  NONE DETECTED (Cut Off Level 10 ng/mL) Final   POC Oxazepam (BZO) 09/15/2023 None Detected  NONE DETECTED (Cut Off Level 300 ng/mL) Final   POC Cocaine UR 09/15/2023 Positive (A)  NONE DETECTED (Cut Off Level 300 ng/mL) Final   POC Methamphetamine UR 09/15/2023 None Detected  NONE DETECTED (Cut Off Level 1000 ng/mL) Final    POC Morphine 09/15/2023 None Detected  NONE DETECTED (Cut Off Level 300 ng/mL) Final   POC Methadone UR 09/15/2023 None Detected  NONE DETECTED (Cut Off Level 300 ng/mL) Final   POC Oxycodone UR 09/15/2023 None Detected  NONE DETECTED (Cut Off Level 100 ng/mL) Final   POC Marijuana UR 09/15/2023 None Detected  NONE DETECTED (Cut Off Level 50 ng/mL) Final  Admission on 08/08/2023, Discharged on 08/22/2023  Component Date Value Ref Range Status   Adenovirus 08/13/2023 NOT DETECTED  NOT DETECTED Final   Coronavirus 229E 08/13/2023 NOT DETECTED  NOT DETECTED Final   Comment: (NOTE) The Coronavirus on the Respiratory Panel, DOES NOT test for the novel  Coronavirus (2019 nCoV)    Coronavirus HKU1 08/13/2023 NOT DETECTED  NOT DETECTED Final   Coronavirus NL63 08/13/2023 NOT DETECTED  NOT DETECTED Final   Coronavirus OC43 08/13/2023 NOT DETECTED  NOT DETECTED Final   Metapneumovirus 08/13/2023  NOT DETECTED  NOT DETECTED Final   Rhinovirus / Enterovirus 08/13/2023 NOT DETECTED  NOT DETECTED Final   Influenza A 08/13/2023 NOT DETECTED  NOT DETECTED Final   Influenza B 08/13/2023 NOT DETECTED  NOT DETECTED Final   Parainfluenza Virus 1 08/13/2023 NOT DETECTED  NOT DETECTED Final   Parainfluenza Virus 2 08/13/2023 NOT DETECTED  NOT DETECTED Final   Parainfluenza Virus 3 08/13/2023 NOT DETECTED  NOT DETECTED Final   Parainfluenza Virus 4 08/13/2023 NOT DETECTED  NOT DETECTED Final   Respiratory Syncytial Virus 08/13/2023 NOT DETECTED  NOT DETECTED Final   Bordetella pertussis 08/13/2023 NOT DETECTED  NOT DETECTED Final   Bordetella Parapertussis 08/13/2023 NOT DETECTED  NOT DETECTED Final   Chlamydophila pneumoniae 08/13/2023 NOT DETECTED  NOT DETECTED Final   Mycoplasma pneumoniae 08/13/2023 NOT DETECTED  NOT DETECTED Final   Performed at [facility], [address] [place], [place] [address]   SARS Coronavirus 2 by RT PCR 08/14/2023 NEGATIVE  NEGATIVE Final   Comment: (NOTE) SARS-CoV-2  target nucleic acids are NOT DETECTED.  The SARS-CoV-2 RNA is generally detectable in upper and lower respiratory specimens during the acute phase of infection. The lowest concentration of SARS-CoV-2 viral copies this assay can detect is 250 copies / mL. A negative result does not preclude SARS-CoV-2 infection and should not be used as the sole basis for treatment or other patient management decisions.  A negative result may occur with improper specimen collection / handling, submission of specimen other than nasopharyngeal swab, presence of viral mutation(s) within the areas targeted by this assay, and inadequate number of viral copies (<250 copies / mL). A negative result must be combined with clinical observations, patient history, and epidemiological information.  Fact Sheet for Patients:   RoadLapTop.co.za  Fact Sheet for Healthcare Providers: http://kim-miller.com/  This test is not yet approved or                           cleared by the [place] FDA and has been authorized for detection and/or diagnosis of SARS-CoV-2 by FDA under an Emergency Use Authorization (EUA).  This EUA will remain in effect (meaning this test can be used) for the duration of the COVID-19 declaration under Section 564(b)(1) of the Act, 21 U.S.C. section 360bbb-3(b)(1), unless the authorization is terminated or revoked sooner.  Performed at [facility], [address] [place], [place] [address]   Admission on 08/08/2023, Discharged on 08/08/2023  Component Date Value Ref Range Status   Sodium 08/08/2023 137  135 - 145 mmol/L Final   Potassium 08/08/2023 3.9  3.5 - 5.1 mmol/L Final   Chloride 08/08/2023 107  98 - 111 mmol/L Final   CO2 08/08/2023 20 (L)  22 - 32 mmol/L Final   Glucose, Bld 08/08/2023 148 (H)  70 - 99 mg/dL Final   Glucose reference range applies only to samples taken after fasting for at least 8 hours.   BUN 08/08/2023 9   8 - 23 mg/dL Final   Creatinine, Ser 08/08/2023 1.43 (H)  0.61 - 1.24 mg/dL Final   Calcium 09/81/1914 8.5 (L)  8.9 - 10.3 mg/dL Final   Total Protein 78/29/5621 7.1  6.5 - 8.1 g/dL Final   Albumin 30/86/5784 3.4 (L)  3.5 - 5.0 g/dL Final   AST 69/62/9528 25  15 - 41 U/L Final   ALT 08/08/2023 18  0 - 44 U/L Final   Alkaline Phosphatase 08/08/2023 76  38 - 126 U/L Final   Total Bilirubin 08/08/2023 0.7  0.3 - 1.2 mg/dL Final   GFR, Estimated 08/08/2023 53 (L)  >60 mL/min Final   Comment: (NOTE) Calculated using the CKD-EPI Creatinine Equation (2021)    Anion gap 08/08/2023 10  5 - 15 Final   Performed at [facility], [address] [place], [place] [address]   Alcohol, Ethyl (B) 08/08/2023 <10  <10 mg/dL Final   Comment: (NOTE) Lowest detectable limit for serum alcohol is 10 mg/dL.  For medical purposes only. Performed at [facility], [address], [place], [place] [address]    Salicylate Lvl 08/08/2023 <7.0 (L)  7.0 - 30.0 mg/dL Final   Performed at [facility], [address] [place], [place] [address]   Acetaminophen (Tylenol), Serum 08/08/2023 <10 (L)  10 - 30 ug/mL Final   Comment: (NOTE) Therapeutic concentrations vary significantly. A range of 10-30 ug/mL  may be an effective concentration for many patients. However, some  are best treated at concentrations outside of this range. Acetaminophen concentrations >150 ug/mL at 4 hours after ingestion  and >50 ug/mL at 12 hours after ingestion are often associated with  toxic reactions.  Performed at [facility], [address], [place], [place] [address]    WBC 08/08/2023 9.9  4.0 - 10.5 K/uL Final   RBC 08/08/2023 4.58  4.22 - 5.81 MIL/uL Final   Hemoglobin 08/08/2023 14.0  13.0 - 17.0 g/dL Final   HCT 38/75/6433 44.0  39.0 - 52.0 % Final   MCV 08/08/2023 96.1  80.0 - 100.0 fL Final   MCH 08/08/2023 30.6  26.0 - 34.0 pg Final   MCHC 08/08/2023 31.8  30.0 - 36.0 g/dL Final   RDW  29/51/8841 12.6  11.5 - 15.5 % Final   Platelets 08/08/2023 249  150 - 400 K/uL Final   nRBC 08/08/2023 0.0  0.0 - 0.2 % Final   Performed at [facility], [address], [place], [place] [address]   Opiates 08/08/2023 NONE DETECTED  NONE DETECTED Final   Cocaine 08/08/2023 POSITIVE (A)  NONE DETECTED Final   Benzodiazepines 08/08/2023 NONE DETECTED  NONE DETECTED Final   Amphetamines 08/08/2023 NONE DETECTED  NONE DETECTED Final   Tetrahydrocannabinol 08/08/2023 NONE DETECTED  NONE DETECTED Final   Barbiturates 08/08/2023 NONE DETECTED  NONE DETECTED Final   Comment: (NOTE) DRUG SCREEN FOR MEDICAL PURPOSES ONLY.  IF CONFIRMATION IS NEEDED FOR ANY PURPOSE, NOTIFY LAB WITHIN 5 DAYS.  LOWEST DETECTABLE LIMITS FOR URINE DRUG SCREEN Drug Class                     Cutoff (ng/mL) Amphetamine and metabolites    1000 Barbiturate and metabolites    200 Benzodiazepine                 200 Opiates and metabolites        300 Cocaine and metabolites        300 THC                            50 Performed at [facility], [address] [place], [place] [address]    SARS Coronavirus 2 by RT PCR 08/08/2023 NEGATIVE  NEGATIVE Final   Performed at [facility], [address] [place], [place] [address]  Admission on 07/15/2023, Discharged on 07/17/2023  Component  Date Value Ref Range Status   Sodium 07/15/2023 135  135 - 145 mmol/L Final   Potassium 07/15/2023 3.7  3.5 - 5.1 mmol/L Final   Chloride 07/15/2023 107  98 - 111 mmol/L Final   CO2 07/15/2023 19 (L)  22 - 32 mmol/L Final   Glucose, Bld 07/15/2023 129 (H)  70 - 99 mg/dL Final   Glucose reference range applies only to samples taken after fasting for at least 8 hours.   BUN 07/15/2023 11  8 - 23 mg/dL Final   Creatinine, Ser 07/15/2023 1.39 (H)  0.61 - 1.24 mg/dL Final   Calcium 16/08/9603 8.4 (L)  8.9 - 10.3 mg/dL Final   Total Protein 54/07/8118 6.9  6.5 - 8.1 g/dL Final   Albumin 14/78/2956 3.6  3.5 - 5.0 g/dL  Final   AST 21/30/8657 21  15 - 41 U/L Final   ALT 07/15/2023 17  0 - 44 U/L Final   Alkaline Phosphatase 07/15/2023 64  38 - 126 U/L Final   Total Bilirubin 07/15/2023 0.5  0.3 - 1.2 mg/dL Final   GFR, Estimated 07/15/2023 55 (L)  >60 mL/min Final   Comment: (NOTE) Calculated using the CKD-EPI Creatinine Equation (2021)    Anion gap 07/15/2023 9  5 - 15 Final   Performed at [facility], [address] [place], [place] [address]   Alcohol, Ethyl (B) 07/15/2023 <10  <10 mg/dL Final   Comment: (NOTE) Lowest detectable limit for serum alcohol is 10 mg/dL.  For medical purposes only. Performed at [facility], [address], [place], [place] [address]    Salicylate Lvl 07/15/2023 <7.0 (L)  7.0 - 30.0 mg/dL Final   Performed at [facility], [address] [place], [place] [address]   Acetaminophen (Tylenol), Serum 07/15/2023 <10 (L)  10 - 30 ug/mL Final   Comment: (NOTE) Therapeutic concentrations vary significantly. A range of 10-30 ug/mL  may be an effective concentration for many patients. However, some  are best treated at concentrations outside of this range. Acetaminophen concentrations >150 ug/mL at 4 hours after ingestion  and >50 ug/mL at 12 hours after ingestion are often associated with  toxic reactions.  Performed at [facility], [address], [place], [place] [address]    WBC 07/15/2023 9.1  4.0 - 10.5 K/uL Final   RBC 07/15/2023 4.20 (L)  4.22 - 5.81 MIL/uL Final   Hemoglobin 07/15/2023 13.0  13.0 - 17.0 g/dL Final   HCT 72/53/6644 40.5  39.0 - 52.0 % Final   MCV 07/15/2023 96.4  80.0 - 100.0 fL Final   MCH 07/15/2023 31.0  26.0 - 34.0 pg Final   MCHC 07/15/2023 32.1  30.0 - 36.0 g/dL Final   RDW 03/47/4259 12.6  11.5 - 15.5 % Final   Platelets 07/15/2023 267  150 - 400 K/uL Final   nRBC 07/15/2023 0.0  0.0 - 0.2 % Final   Performed at [facility], [address], [place], [place] [address]   Opiates 07/15/2023 NONE DETECTED   NONE DETECTED Final   Cocaine 07/15/2023 POSITIVE (A)  NONE DETECTED Final   Benzodiazepines 07/15/2023 NONE DETECTED  NONE DETECTED Final   Amphetamines 07/15/2023 NONE DETECTED  NONE DETECTED Final   Tetrahydrocannabinol 07/15/2023 POSITIVE (A)  NONE DETECTED Final   Barbiturates 07/15/2023 NONE DETECTED  NONE DETECTED Final   Comment: (NOTE) DRUG SCREEN FOR MEDICAL PURPOSES ONLY.  IF CONFIRMATION IS NEEDED FOR ANY PURPOSE, NOTIFY  LAB WITHIN 5 DAYS.  LOWEST DETECTABLE LIMITS FOR URINE DRUG SCREEN Drug Class                     Cutoff (ng/mL) Amphetamine and metabolites    1000 Barbiturate and metabolites    200 Benzodiazepine                 200 Opiates and metabolites        300 Cocaine and metabolites        300 THC                            50 Performed at [facility], [address], [place], [place] [address]    Color, Urine 07/15/2023 YELLOW  YELLOW Final   APPearance 07/15/2023 CLEAR  CLEAR Final   Specific Gravity, Urine 07/15/2023 >1.030 (H)  1.005 - 1.030 Final   pH 07/15/2023 6.0  5.0 - 8.0 Final   Glucose, UA 07/15/2023 NEGATIVE  NEGATIVE mg/dL Final   Hgb urine dipstick 07/15/2023 NEGATIVE  NEGATIVE Final   Bilirubin Urine 07/15/2023 NEGATIVE  NEGATIVE Final   Ketones, ur 07/15/2023 NEGATIVE  NEGATIVE mg/dL Final   Protein, ur 56/38/7564 NEGATIVE  NEGATIVE mg/dL Final   Nitrite 33/29/5188 NEGATIVE  NEGATIVE Final   Leukocytes,Ua 07/15/2023 NEGATIVE  NEGATIVE Final   Comment: Microscopic not done on urines with negative protein, blood, leukocytes, nitrite, or glucose < 500 mg/dL. Performed at [facility], [address] [place], [place] [address]    SARS Coronavirus 2 by RT PCR 07/16/2023 NEGATIVE  NEGATIVE Final   Performed at [facility], [address] [place], [place] [address]  Admission on 07/12/2023, Discharged on 07/12/2023  Component Date Value Ref Range Status   Troponin I (High Sensitivity) 07/12/2023 7  <18 ng/L Final    Comment: (NOTE) Elevated high sensitivity troponin I (hsTnI) values and significant  changes across serial measurements may suggest ACS but many other  chronic and acute conditions are known to elevate hsTnI results.  Refer to the "Links" section for chest pain algorithms and additional  guidance. Performed at [facility], [address] [place], [place] [address]    Sodium 07/12/2023 136  135 - 145 mmol/L Final   Potassium 07/12/2023 4.1  3.5 - 5.1 mmol/L Final   Chloride 07/12/2023 106  98 - 111 mmol/L Final   CO2 07/12/2023 19 (L)  22 - 32 mmol/L Final   Glucose, Bld 07/12/2023 117 (H)  70 - 99 mg/dL Final   Glucose reference range applies only to samples taken after fasting for at least 8 hours.   BUN 07/12/2023 10  8 - 23 mg/dL Final   Creatinine, Ser 07/12/2023 1.17  0.61 - 1.24 mg/dL Final   Calcium 23/55/7322 8.2 (L)  8.9 - 10.3 mg/dL Final   Total Protein 02/54/2706 6.6  6.5 - 8.1 g/dL Final   Albumin 23/76/2831 3.3 (L)  3.5 - 5.0 g/dL Final   AST 51/76/1607 22  15 - 41 U/L Final   ALT 07/12/2023 16  0 - 44 U/L Final   Alkaline Phosphatase 07/12/2023 62  38 - 126 U/L Final   Total Bilirubin 07/12/2023 0.6  0.3 - 1.2 mg/dL Final   GFR, Estimated 07/12/2023 >60  >60 mL/min Final   Comment: (NOTE) Calculated using the CKD-EPI Creatinine Equation (2021)    Anion gap 07/12/2023 11  5 -  15 Final   Performed at [facility], [address], [place], [place] [address]   Alcohol, Ethyl (B) 07/12/2023 <10  <10 mg/dL Final   Comment: (NOTE) Lowest detectable limit for serum alcohol is 10 mg/dL.  For medical purposes only. Performed at [facility], [address] [place], [place] [address]    Salicylate Lvl 07/12/2023 <7.0 (L)  7.0 - 30.0 mg/dL Final   Performed at [facility], [address] [place], [place] [address]   Acetaminophen (Tylenol), Serum 07/12/2023 <10 (L)  10 - 30 ug/mL Final   Comment: (NOTE) Therapeutic concentrations vary  significantly. A range of 10-30 ug/mL  may be an effective concentration for many patients. However, some  are best treated at concentrations outside of this range. Acetaminophen concentrations >150 ug/mL at 4 hours after ingestion  and >50 ug/mL at 12 hours after ingestion are often associated with  toxic reactions.  Performed at [facility], [address] [place], [place] [address]    WBC 07/12/2023 7.9  4.0 - 10.5 K/uL Final   RBC 07/12/2023 4.43  4.22 - 5.81 MIL/uL Final   Hemoglobin 07/12/2023 13.3  13.0 - 17.0 g/dL Final   HCT 25/36/6440 41.7  39.0 - 52.0 % Final   MCV 07/12/2023 94.1  80.0 - 100.0 fL Final   MCH 07/12/2023 30.0  26.0 - 34.0 pg Final   MCHC 07/12/2023 31.9  30.0 - 36.0 g/dL Final   RDW 34/74/2595 12.5  11.5 - 15.5 % Final   Platelets 07/12/2023 255  150 - 400 K/uL Final   nRBC 07/12/2023 0.0  0.0 - 0.2 % Final   Performed at [facility], [address], [place], [place] [address]   Opiates 07/12/2023 NONE DETECTED  NONE DETECTED Final   Cocaine 07/12/2023 POSITIVE (A)  NONE DETECTED Final   Benzodiazepines 07/12/2023 NONE DETECTED  NONE DETECTED Final   Amphetamines 07/12/2023 NONE DETECTED  NONE DETECTED Final   Tetrahydrocannabinol 07/12/2023 POSITIVE (A)  NONE DETECTED Final   Barbiturates 07/12/2023 NONE DETECTED  NONE DETECTED Final   Comment: (NOTE) DRUG SCREEN FOR MEDICAL PURPOSES ONLY.  IF CONFIRMATION IS NEEDED FOR ANY PURPOSE, NOTIFY LAB WITHIN 5 DAYS.  LOWEST DETECTABLE LIMITS FOR URINE DRUG SCREEN Drug Class                     Cutoff (ng/mL) Amphetamine and metabolites    1000 Barbiturate and metabolites    200 Benzodiazepine                 200 Opiates and metabolites        300 Cocaine and metabolites        300 THC                            50 Performed at [facility], [address] [place], [place] [address]    Troponin I (High Sensitivity) 07/12/2023 7  <18 ng/L Final   Comment: (NOTE) Elevated high  sensitivity troponin I (hsTnI) values and significant  changes across serial measurements may suggest ACS but many other  chronic and acute conditions are known to elevate hsTnI results.  Refer to the "Links" section for chest pain algorithms and additional  guidance. Performed at [facility], [address] [place], [place] [address]   Admission on 06/01/2023, Discharged on 06/01/2023  Component Date  Value Ref Range Status   Fecal Occult Bld 06/01/2023 POSITIVE (A)  NEGATIVE Final   Sodium 06/01/2023 135  135 - 145 mmol/L Final   Potassium 06/01/2023 4.0  3.5 - 5.1 mmol/L Final   Chloride 06/01/2023 102  98 - 111 mmol/L Final   CO2 06/01/2023 22  22 - 32 mmol/L Final   Glucose, Bld 06/01/2023 100 (H)  70 - 99 mg/dL Final   Glucose reference range applies only to samples taken after fasting for at least 8 hours.   BUN 06/01/2023 13  8 - 23 mg/dL Final   Creatinine, Ser 06/01/2023 1.35 (H)  0.61 - 1.24 mg/dL Final   Calcium 40/08/2724 8.6 (L)  8.9 - 10.3 mg/dL Final   Total Protein 36/64/4034 6.8  6.5 - 8.1 g/dL Final   Albumin 74/25/9563 3.5  3.5 - 5.0 g/dL Final   AST 87/56/4332 26  15 - 41 U/L Final   ALT 06/01/2023 18  0 - 44 U/L Final   Alkaline Phosphatase 06/01/2023 65  38 - 126 U/L Final   Total Bilirubin 06/01/2023 0.8  0.3 - 1.2 mg/dL Final   GFR, Estimated 06/01/2023 57 (L)  >60 mL/min Final   Comment: (NOTE) Calculated using the CKD-EPI Creatinine Equation (2021)    Anion gap 06/01/2023 11  5 - 15 Final   Performed at [facility], [address] [place], [place] [address]   WBC 06/01/2023 11.7 (H)  4.0 - 10.5 K/uL Final   RBC 06/01/2023 3.94 (L)  4.22 - 5.81 MIL/uL Final   Hemoglobin 06/01/2023 12.2 (L)  13.0 - 17.0 g/dL Final   HCT 41/66/0630 37.8 (L)  39.0 - 52.0 % Final   MCV 06/01/2023 95.9  80.0 - 100.0 fL Final   MCH 06/01/2023 31.0  26.0 - 34.0 pg Final   MCHC 06/01/2023 32.3  30.0 - 36.0 g/dL Final   RDW 16/11/930 12.4  11.5 - 15.5 % Final    Platelets 06/01/2023 238  150 - 400 K/uL Final   nRBC 06/01/2023 0.0  0.0 - 0.2 % Final   Neutrophils Relative % 06/01/2023 75  % Final   Neutro Abs 06/01/2023 8.7 (H)  1.7 - 7.7 K/uL Final   Lymphocytes Relative 06/01/2023 16  % Final   Lymphs Abs 06/01/2023 1.9  0.7 - 4.0 K/uL Final   Monocytes Relative 06/01/2023 7  % Final   Monocytes Absolute 06/01/2023 0.9  0.1 - 1.0 K/uL Final   Eosinophils Relative 06/01/2023 0  % Final   Eosinophils Absolute 06/01/2023 0.0  0.0 - 0.5 K/uL Final   Basophils Relative 06/01/2023 1  % Final   Basophils Absolute 06/01/2023 0.1  0.0 - 0.1 K/uL Final   Immature Granulocytes 06/01/2023 1  % Final   Abs Immature Granulocytes 06/01/2023 0.10 (H)  0.00 - 0.07 K/uL Final   Performed at [facility], [address], [place], [place] [address]   Prothrombin Time 06/01/2023 13.7  11.4 - 15.2 seconds Final   INR 06/01/2023 1.0  0.8 - 1.2 Final   Comment: (NOTE) INR goal varies based on device and disease states. Performed at [facility], [address] [place], [place] [address]    ABO/RH(D) 06/01/2023 O POS   Final   Antibody Screen 06/01/2023 NEG   Final   Sample Expiration 06/01/2023    Final                   Value:06/04/2023,2359 Performed at  [facility], [address], [place], [place] [address]    ABO/RH(D) 06/01/2023    Final                   Value:O POS Performed at [facility], [address] [place], [place] [address]   Admission on 05/24/2023, Discharged on 05/25/2023  Component Date Value Ref Range Status   Sodium 05/24/2023 139  135 - 145 mmol/L Final   Potassium 05/24/2023 3.5  3.5 - 5.1 mmol/L Final   Chloride 05/24/2023 105  98 - 111 mmol/L Final   CO2 05/24/2023 21 (L)  22 - 32 mmol/L Final   Glucose, Bld 05/24/2023 117 (H)  70 - 99 mg/dL Final   Glucose reference range applies only to samples taken after fasting for at least 8 hours.   BUN 05/24/2023 7 (L)  8 - 23 mg/dL Final   Creatinine, Ser  05/24/2023 1.10  0.61 - 1.24 mg/dL Final   Calcium 59/56/3875 8.7 (L)  8.9 - 10.3 mg/dL Final   Total Protein 64/33/2951 6.9  6.5 - 8.1 g/dL Final   Albumin 88/41/6606 3.6  3.5 - 5.0 g/dL Final   AST 30/16/0109 19  15 - 41 U/L Final   ALT 05/24/2023 14  0 - 44 U/L Final   Alkaline Phosphatase 05/24/2023 62  38 - 126 U/L Final   Total Bilirubin 05/24/2023 0.7  0.3 - 1.2 mg/dL Final   GFR, Estimated 05/24/2023 >60  >60 mL/min Final   Comment: (NOTE) Calculated using the CKD-EPI Creatinine Equation (2021)    Anion gap 05/24/2023 13  5 - 15 Final   Performed at [facility], [address] [place], [place] [address]   Alcohol, Ethyl (B) 05/24/2023 18 (H)  <10 mg/dL Final   Comment: (NOTE) Lowest detectable limit for serum alcohol is 10 mg/dL.  For medical purposes only. Performed at [facility], [address], [place], [place] [address]    Salicylate Lvl 05/24/2023 <7.0 (L)  7.0 - 30.0 mg/dL Final   Performed at [facility], [address], [place], [place] [address]   Acetaminophen (Tylenol), Serum 05/24/2023 <10 (L)  10 - 30 ug/mL Final   Comment: (NOTE) Therapeutic concentrations vary significantly. A range of 10-30 ug/mL  may be an effective concentration for many patients. However, some  are best treated at concentrations outside of this range. Acetaminophen concentrations >150 ug/mL at 4 hours after ingestion  and >50 ug/mL at 12 hours after ingestion are often associated with  toxic reactions.  Performed at [facility], [address] [place], [place] [address]    WBC 05/24/2023 7.7  4.0 - 10.5 K/uL Final   RBC 05/24/2023 4.01 (L)  4.22 - 5.81 MIL/uL Final   Hemoglobin 05/24/2023 12.5 (L)  13.0 - 17.0 g/dL Final   HCT 62/83/1517 38.3 (L)  39.0 - 52.0 % Final   MCV 05/24/2023 95.5  80.0 - 100.0 fL Final   MCH 05/24/2023 31.2  26.0 - 34.0 pg Final   MCHC 05/24/2023 32.6  30.0 - 36.0 g/dL Final   RDW 61/60/7371 12.7  11.5 - 15.5 % Final    Platelets 05/24/2023 255  150 - 400 K/uL Final   nRBC 05/24/2023 0.0  0.0 - 0.2 % Final   Performed at [facility], [address] [place], [place] [address]   Opiates 05/24/2023 NONE DETECTED  NONE DETECTED Final   Cocaine 05/24/2023  POSITIVE (A)  NONE DETECTED Final   Benzodiazepines 05/24/2023 NONE DETECTED  NONE DETECTED Final   Amphetamines 05/24/2023 NONE DETECTED  NONE DETECTED Final   Tetrahydrocannabinol 05/24/2023 NONE DETECTED  NONE DETECTED Final   Barbiturates 05/24/2023 NONE DETECTED  NONE DETECTED Final   Comment: (NOTE) DRUG SCREEN FOR MEDICAL PURPOSES ONLY.  IF CONFIRMATION IS NEEDED FOR ANY PURPOSE, NOTIFY LAB WITHIN 5 DAYS.  LOWEST DETECTABLE LIMITS FOR URINE DRUG SCREEN Drug Class                     Cutoff (ng/mL) Amphetamine and metabolites    1000 Barbiturate and metabolites    200 Benzodiazepine                 200 Opiates and metabolites        300 Cocaine and metabolites        300 THC                            50 Performed at [facility], [address], [place], [place] [address]    Troponin I (High Sensitivity) 05/24/2023 14  <18 ng/L Final   Comment: (NOTE) Elevated high sensitivity troponin I (hsTnI) values and significant  changes across serial measurements may suggest ACS but many other  chronic and acute conditions are known to elevate hsTnI results.  Refer to the "Links" section for chest pain algorithms and additional  guidance. Performed at [facility], [address], [place], [place] [address]    Troponin I (High Sensitivity) 05/24/2023 13  <18 ng/L Final   Comment: (NOTE) Elevated high sensitivity troponin I (hsTnI) values and significant  changes across serial measurements may suggest ACS but many other  chronic and acute conditions are known to elevate hsTnI results.  Refer to the "Links" section for chest pain algorithms and additional  guidance. Performed at [facility], [address],  [place], [place] [address]   Admission on 04/05/2023, Discharged on 04/06/2023  Component Date Value Ref Range Status   Acetaminophen (Tylenol), Serum 04/05/2023 <10 (L)  10 - 30 ug/mL Final   Comment: (NOTE) Therapeutic concentrations vary significantly. A range of 10-30 ug/mL  may be an effective concentration for many patients. However, some  are best treated at concentrations outside of this range. Acetaminophen concentrations >150 ug/mL at 4 hours after ingestion  and >50 ug/mL at 12 hours after ingestion are often associated with  toxic reactions.  Performed at [facility], [address] [place], [place] [address]    WBC 04/05/2023 11.2 (H)  4.0 - 10.5 K/uL Final   RBC 04/05/2023 4.39  4.22 - 5.81 MIL/uL Final   Hemoglobin 04/05/2023 13.3  13.0 - 17.0 g/dL Final   HCT 13/24/4010 42.6  39.0 - 52.0 % Final   MCV 04/05/2023 97.0  80.0 - 100.0 fL Final   MCH 04/05/2023 30.3  26.0 - 34.0 pg Final   MCHC 04/05/2023 31.2  30.0 - 36.0 g/dL Final   RDW 27/25/3664 13.0  11.5 - 15.5 % Final   Platelets 04/05/2023 276  150 - 400 K/uL Final   nRBC 04/05/2023 0.0  0.0 - 0.2 % Final   Neutrophils Relative % 04/05/2023 61  % Final   Neutro Abs 04/05/2023 6.9  1.7 - 7.7 K/uL Final   Lymphocytes Relative 04/05/2023 26  % Final   Lymphs Abs 04/05/2023 2.9  0.7 - 4.0 K/uL Final   Monocytes Relative 04/05/2023 9  % Final   Monocytes Absolute 04/05/2023 1.0  0.1 - 1.0 K/uL Final   Eosinophils Relative 04/05/2023 3  % Final   Eosinophils Absolute 04/05/2023 0.3  0.0 - 0.5 K/uL Final   Basophils Relative 04/05/2023 1  % Final   Basophils Absolute 04/05/2023 0.1  0.0 - 0.1 K/uL Final   Immature Granulocytes 04/05/2023 0  % Final   Abs Immature Granulocytes 04/05/2023 0.03  0.00 - 0.07 K/uL Final   Performed at [facility], [address] [place], [place] [address]   Sodium 04/05/2023 140  135 - 145 mmol/L Final   Potassium 04/05/2023 4.1  3.5 - 5.1 mmol/L Final   Chloride 04/05/2023  106  98 - 111 mmol/L Final   CO2 04/05/2023 25  22 - 32 mmol/L Final   Glucose, Bld 04/05/2023 104 (H)  70 - 99 mg/dL Final   Glucose reference range applies only to samples taken after fasting for at least 8 hours.   BUN 04/05/2023 15  8 - 23 mg/dL Final   Creatinine, Ser 04/05/2023 1.59 (H)  0.61 - 1.24 mg/dL Final   Calcium 30/86/5784 9.0  8.9 - 10.3 mg/dL Final   Total Protein 69/62/9528 7.2  6.5 - 8.1 g/dL Final   Albumin 41/32/4401 3.5  3.5 - 5.0 g/dL Final   AST 02/72/5366 22  15 - 41 U/L Final   ALT 04/05/2023 14  0 - 44 U/L Final   Alkaline Phosphatase 04/05/2023 83  38 - 126 U/L Final   Total Bilirubin 04/05/2023 0.6  0.3 - 1.2 mg/dL Final   GFR, Estimated 04/05/2023 47 (L)  >60 mL/min Final   Comment: (NOTE) Calculated using the CKD-EPI Creatinine Equation (2021)    Anion gap 04/05/2023 9  5 - 15 Final   Performed at [facility], [address] [place], [place] [address]   Alcohol, Ethyl (B) 04/05/2023 <10  <10 mg/dL Final   Comment: (NOTE) Lowest detectable limit for serum alcohol is 10 mg/dL.  For medical purposes only. Performed at [facility], [address], [place], [place] [address]    Opiates 04/05/2023 NONE DETECTED  NONE DETECTED Final   Cocaine 04/05/2023 POSITIVE (A)  NONE DETECTED Final   Benzodiazepines 04/05/2023 NONE DETECTED  NONE DETECTED Final   Amphetamines 04/05/2023 NONE DETECTED  NONE DETECTED Final   Tetrahydrocannabinol 04/05/2023 NONE DETECTED  NONE DETECTED Final   Barbiturates 04/05/2023 NONE DETECTED  NONE DETECTED Final   Comment: (NOTE) DRUG SCREEN FOR MEDICAL PURPOSES ONLY.  IF CONFIRMATION IS NEEDED FOR ANY PURPOSE, NOTIFY LAB WITHIN 5 DAYS.  LOWEST DETECTABLE LIMITS FOR URINE DRUG SCREEN Drug Class                     Cutoff (ng/mL) Amphetamine and metabolites    1000 Barbiturate and metabolites    200 Benzodiazepine                 200 Opiates and metabolites        300 Cocaine and metabolites        300 THC                             50 Performed at [facility], [address], [place], [place] [address]    Salicylate Lvl 04/05/2023 <7.0 (L)  7.0 - 30.0  mg/dL Final   Performed at [facility], [address], [place], [place] [address]   Troponin I (High Sensitivity) 04/05/2023 12  <18 ng/L Final   Comment: (NOTE) Elevated high sensitivity troponin I (hsTnI) values and significant  changes across serial measurements may suggest ACS but many other  chronic and acute conditions are known to elevate hsTnI results.  Refer to the "Links" section for chest pain algorithms and additional  guidance. Performed at [facility], [address] [place], [place] [address]    Troponin I (High Sensitivity) 04/05/2023 12  <18 ng/L Final   Comment: (NOTE) Elevated high sensitivity troponin I (hsTnI) values and significant  changes across serial measurements may suggest ACS but many other  chronic and acute conditions are known to elevate hsTnI results.  Refer to the "Links" section for chest pain algorithms and additional  guidance. Performed at [facility], [address], [place], [place] [address]    SARS Coronavirus 2 by RT PCR 04/05/2023 NEGATIVE  NEGATIVE Final   Performed at [facility], [address], [place], [place] [address]  [facility]regational Nurse Program on 03/22/2023  Component Date Value Ref Range Status   POC Glucose 03/22/2023 140 (A)  70 - 99 mg/dl Final    Allergies: Shellfish allergy  Medications:  Facility Ordered Medications  Medication   acetaminophen (TYLENOL) tablet 650 mg   albuterol (VENTOLIN HFA) 108 (90 Base) MCG/ACT inhaler 2 puff   alum & mag hydroxide-simeth (MAALOX/MYLANTA) 200-200-20 MG/5ML suspension 30 mL   [START ON 09/17/2023] ARIPiprazole (ABILIFY) tablet 5 mg   [START ON 09/17/2023] atorvastatin (LIPITOR) tablet 80 mg   cloNIDine (CATAPRES) tablet 0.1 mg   [START ON 09/17/2023] diltiazem (CARDIZEM CD) 24 hr capsule 180 mg    hydrOXYzine (ATARAX) tablet 50 mg   loperamide (IMODIUM) capsule 2-4 mg   LORazepam (ATIVAN) tablet 1 mg   magnesium hydroxide (MILK OF MAGNESIA) suspension 30 mL   mometasone-formoterol (DULERA) 200-5 MCG/ACT inhaler 2 puff   [START ON 09/17/2023] multivitamin with minerals tablet 1 tablet   [START ON 09/17/2023] nicotine (NICODERM CQ - dosed in mg/24 hours) patch 21 mg   ondansetron (ZOFRAN-ODT) disintegrating tablet 4 mg   [START ON 09/17/2023] thiamine (VITAMIN B1) tablet 100 mg   traZODone (DESYREL) tablet 50 mg   PTA Medications  Medication Sig   atorvastatin (LIPITOR) 80 MG tablet Take 1 tablet (80 mg total) by mouth daily.   diltiazem (CARDIZEM CD) 180 MG 24 hr capsule Take 1 capsule (180 mg total) by mouth daily.   ARIPiprazole (ABILIFY) 5 MG tablet Take 1 tablet (5 mg total) by mouth daily.   doxepin (SINEQUAN) 75 MG capsule Take 1 capsule (75 mg total) by mouth at bedtime.   hydrOXYzine (ATARAX) 50 MG tablet Take 1 tablet (50 mg total) by mouth 3 (three) times daily as needed for anxiety.   traZODone (DESYREL) 50 MG tablet Take 1 tablet (50 mg total) by mouth at bedtime as needed for sleep.   albuterol (VENTOLIN HFA) 108 (90 Base) MCG/ACT inhaler Inhale 2 puffs into the lungs every 6 (six) hours as needed for shortness of breath.   mometasone-formoterol (DULERA) 200-5 MCG/ACT AERO Inhale 2 puffs into the lungs 2 (two) times daily.   latanoprost (XALATAN) 0.005 % ophthalmic solution Place 1 drop into both eyes at bedtime.    Long Term Goals: Improvement in symptoms so as ready for discharge  Short Term Goals: Patient will verbalize feelings  in meetings with treatment team members., Patient will attend at least of 50% of the groups daily., Pt will complete the PHQ9 on admission, day 3 and discharge., and Patient will take medications as prescribed daily.  Medical Decision Making  Mr. Issachar Hawryluk is a 67 year old male with a past psychiatric history history of multiple  inpatient hospitalizations (x3 2024, most recent in October), major depressive disorder, cocaine use disorder, alcohol use disorder and past medical history of COPD, hypertension, NSTEMI, CAD who presented to the behavioral health urgent care unaccompanied requesting detox from alcohol and cocaine. Patient is vague in his responses and has long history of non-compliance with medications and is not currently reporting increased depressive symptoms. Goal is to help him get through this detox period. While inpatient rehab is recommended for him, he requests intensive outpatient services.   Labs notable for Cr 1.17, AST 19, ALT 15. Lipid panel wnl. Alc <10. UDS+cocaine. QTc 437.   #Alcohol use disorder #Cocaine use disorder  -d/c ativan taper -CIWA -PRN ativan for CIWA >10  -PRN: atarax, tylenol, maalox, imodium, milk of mg, zofran -daily MV, thiamine  -recommend abstinence from cocaine   #History of MDD Has been noncompliant with medications -restart home abilify 5   #HTN -restart home cardizem 180 daily  #HLD -restart home lipitor 80  #COPD -restart home dulera, PRN albuterol   #Tobacco use disorder -declines NRT  Dispo: Patient set up with [facility] appointment on Wednesday 1pm     Recommendations  Based on my evaluation the patient does not appear to have an emergency medical condition.  Karie Fetch, MD, PGY-2 09/16/23  12:49 PM

## 2023-09-16 NOTE — ED Notes (Signed)
Patient in the dayroom with other patients watching TV. Has no complaints at the moment. NAD, Respirations even and unlabored. Will continue to monitor for safety.

## 2023-09-16 NOTE — ED Provider Notes (Signed)
FBC/OBS ASAP Discharge Summary  Date and Time: 09/16/2023 8:57 AM  Name: Robert Chan  MRN:  [id number]   Discharge Diagnoses:  Final diagnoses:  Alcohol-induced mood disorder (HCC)  Cocaine use disorder (HCC)  Moderate episode of recurrent major depressive disorder (HCC)  Essential hypertension  Tobacco use disorder  COPD with chronic bronchitis (HCC)  PAD (peripheral artery disease) (HCC)    Subjective:   Stay Summary: Khiree Newman is a 67 year old male patient with a psychiatric history significant for MDD, alcohol abuse, cocaine use disorder and a medical history of hypertension who presented to the [facility] behavioral health urgent care on 09/15/2023 requesting detox from alcohol and cocaine use.  Per chart review, patient reported drinking 2 to 3 ounces bottles of alcohol per day.  He denied a history of alcohol withdrawal seizures   Total Time spent with patient: 15 minutes  Past Psychiatric History:   Past Medical History:   Family History:   Family Psychiatric History:   Social History:   Tobacco Cessation:  A prescription for an FDA-approved tobacco cessation medication provided at discharge  Current Medications:  Current Facility-Administered Medications  Medication Dose Route Frequency Provider Last Rate Last Admin   acetaminophen (TYLENOL) tablet 650 mg  650 mg Oral Q6H PRN Bing Neighbors, NP       albuterol (VENTOLIN HFA) 108 (90 Base) MCG/ACT inhaler 2 puff  2 puff Inhalation Q6H PRN Bing Neighbors, NP       alum & mag hydroxide-simeth (MAALOX/MYLANTA) 200-200-20 MG/5ML suspension 30 mL  30 mL Oral Q4H PRN Bing Neighbors, NP       ARIPiprazole (ABILIFY) tablet 5 mg  5 mg Oral Daily Bing Neighbors, NP   5 mg at 09/15/23 2120   atorvastatin (LIPITOR) tablet 80 mg  80 mg Oral Daily Bing Neighbors, NP   80 mg at 09/15/23 2120   cloNIDine (CATAPRES) tablet 0.1 mg  0.1 mg Oral BID PRN Bing Neighbors, NP       diltiazem  (CARDIZEM CD) 24 hr capsule 180 mg  180 mg Oral Daily Bing Neighbors, NP   180 mg at 09/15/23 2119   hydrOXYzine (ATARAX) tablet 50 mg  50 mg Oral TID PRN Bing Neighbors, NP       loperamide (IMODIUM) capsule 2-4 mg  2-4 mg Oral PRN Bing Neighbors, NP       LORazepam (ATIVAN) tablet 1 mg  1 mg Oral Q6H PRN Bing Neighbors, NP       LORazepam (ATIVAN) tablet 1 mg  1 mg Oral QID Bing Neighbors, NP   1 mg at 09/15/23 2120   Followed by   Melene Muller ON 09/17/2023] LORazepam (ATIVAN) tablet 1 mg  1 mg Oral TID Bing Neighbors, NP       Followed by   Melene Muller ON 09/18/2023] LORazepam (ATIVAN) tablet 1 mg  1 mg Oral BID Bing Neighbors, NP       Followed by   Melene Muller ON 09/19/2023] LORazepam (ATIVAN) tablet 1 mg  1 mg Oral Daily Bing Neighbors, NP       magnesium hydroxide (MILK OF MAGNESIA) suspension 30 mL  30 mL Oral Daily PRN Bing Neighbors, NP       mometasone-formoterol (DULERA) 200-5 MCG/ACT inhaler 2 puff  2 puff Inhalation BID Bing Neighbors, NP   2 puff at 09/15/23 2121   multivitamin with minerals tablet 1 tablet  1 tablet Oral Daily  Bing Neighbors, NP   1 tablet at 09/15/23 1745   nicotine (NICODERM CQ - dosed in mg/24 hours) patch 21 mg  21 mg Transdermal Q0600 Bing Neighbors, NP       ondansetron (ZOFRAN-ODT) disintegrating tablet 4 mg  4 mg Oral Q6H PRN Bing Neighbors, NP       thiamine (VITAMIN B1) tablet 100 mg  100 mg Oral Daily Bing Neighbors, NP       traZODone (DESYREL) tablet 50 mg  50 mg Oral QHS PRN Bing Neighbors, NP       Current Outpatient Medications  Medication Sig Dispense Refill   albuterol (VENTOLIN HFA) 108 (90 Base) MCG/ACT inhaler Inhale 2 puffs into the lungs every 6 (six) hours as needed for shortness of breath. 18 g 1   ARIPiprazole (ABILIFY) 5 MG tablet Take 1 tablet (5 mg total) by mouth daily. 30 tablet 0   atorvastatin (LIPITOR) 80 MG tablet Take 1 tablet (80 mg total) by mouth daily. 30 tablet 0   diltiazem  (CARDIZEM CD) 180 MG 24 hr capsule Take 1 capsule (180 mg total) by mouth daily. 30 capsule 0   doxepin (SINEQUAN) 75 MG capsule Take 1 capsule (75 mg total) by mouth at bedtime. 30 capsule 0   hydrOXYzine (ATARAX) 50 MG tablet Take 1 tablet (50 mg total) by mouth 3 (three) times daily as needed for anxiety. 30 tablet 0   latanoprost (XALATAN) 0.005 % ophthalmic solution Place 1 drop into both eyes at bedtime. 2.5 mL 12   mometasone-formoterol (DULERA) 200-5 MCG/ACT AERO Inhale 2 puffs into the lungs 2 (two) times daily. 1 each 1   traZODone (DESYREL) 50 MG tablet Take 1 tablet (50 mg total) by mouth at bedtime as needed for sleep. 30 tablet 0    PTA Medications:  Facility Ordered Medications  Medication   acetaminophen (TYLENOL) tablet 650 mg   alum & mag hydroxide-simeth (MAALOX/MYLANTA) 200-200-20 MG/5ML suspension 30 mL   magnesium hydroxide (MILK OF MAGNESIA) suspension 30 mL   hydrOXYzine (ATARAX) tablet 50 mg   traZODone (DESYREL) tablet 50 mg   nicotine (NICODERM CQ - dosed in mg/24 hours) patch 21 mg   [COMPLETED] thiamine (VITAMIN B1) injection 100 mg   thiamine (VITAMIN B1) tablet 100 mg   multivitamin with minerals tablet 1 tablet   LORazepam (ATIVAN) tablet 1 mg   loperamide (IMODIUM) capsule 2-4 mg   ondansetron (ZOFRAN-ODT) disintegrating tablet 4 mg   LORazepam (ATIVAN) tablet 1 mg   Followed by   Melene Muller ON 09/17/2023] LORazepam (ATIVAN) tablet 1 mg   Followed by   Melene Muller ON 09/18/2023] LORazepam (ATIVAN) tablet 1 mg   Followed by   Melene Muller ON 09/19/2023] LORazepam (ATIVAN) tablet 1 mg   cloNIDine (CATAPRES) tablet 0.1 mg   albuterol (VENTOLIN HFA) 108 (90 Base) MCG/ACT inhaler 2 puff   diltiazem (CARDIZEM CD) 24 hr capsule 180 mg   atorvastatin (LIPITOR) tablet 80 mg   mometasone-formoterol (DULERA) 200-5 MCG/ACT inhaler 2 puff   ARIPiprazole (ABILIFY) tablet 5 mg   PTA Medications  Medication Sig   atorvastatin (LIPITOR) 80 MG tablet Take 1 tablet (80 mg total)  by mouth daily.   diltiazem (CARDIZEM CD) 180 MG 24 hr capsule Take 1 capsule (180 mg total) by mouth daily.   ARIPiprazole (ABILIFY) 5 MG tablet Take 1 tablet (5 mg total) by mouth daily.   doxepin (SINEQUAN) 75 MG capsule Take 1 capsule (75 mg total) by mouth at  bedtime.   hydrOXYzine (ATARAX) 50 MG tablet Take 1 tablet (50 mg total) by mouth 3 (three) times daily as needed for anxiety.   traZODone (DESYREL) 50 MG tablet Take 1 tablet (50 mg total) by mouth at bedtime as needed for sleep.   albuterol (VENTOLIN HFA) 108 (90 Base) MCG/ACT inhaler Inhale 2 puffs into the lungs every 6 (six) hours as needed for shortness of breath.   mometasone-formoterol (DULERA) 200-5 MCG/ACT AERO Inhale 2 puffs into the lungs 2 (two) times daily.   latanoprost (XALATAN) 0.005 % ophthalmic solution Place 1 drop into both eyes at bedtime.       10/19/2022    2:53 PM 05/11/2022    2:30 PM 03/31/2022    2:15 PM  Depression screen PHQ 2/9  Decreased Interest 0 1 0  Down, Depressed, Hopeless 0 1 0  PHQ - 2 Score 0 2 0  Altered sleeping  0   Tired, decreased energy  1   Change in appetite  1   Trouble concentrating  1   Moving slowly or fidgety/restless  0   Suicidal thoughts  0   PHQ-9 Score  5     Flowsheet Row ED from 09/15/2023 in [facility] Most recent reading at 09/15/2023  5:08 PM Admission (Discharged) from 08/08/2023 in [facility] Most recent reading at 08/09/2023 12:00 AM ED from 08/08/2023 in [facility] Emergency Department at [facility] Most recent reading at 08/08/2023 12:06 PM  C-SSRS RISK CATEGORY No Risk High Risk High Risk       Musculoskeletal  Strength & Muscle Tone: within normal limits Gait & Station: normal Patient leans: N/A  Psychiatric Specialty Exam  Presentation  General Appearance:  Appropriate for Environment  Eye Contact: Fair  Speech: Clear and Coherent  Speech  Volume: Normal  Handedness: Right   Mood and Affect  Mood: Dysphoric  Affect: Congruent   Thought Process  Thought Processes: Coherent  Descriptions of Associations:Intact  Orientation:Full (Time, Place and Person)  Thought Content:Logical  Diagnosis of Schizophrenia or Schizoaffective disorder in past: No    Hallucinations:Hallucinations: None  Ideas of Reference:None  Suicidal Thoughts:Suicidal Thoughts: No  Homicidal Thoughts:Homicidal Thoughts: No   Sensorium  Memory: Immediate Fair; Recent Fair; Remote Fair  Judgment: Fair  Insight: Fair   Chartered certified accountant: Fair  Attention Span: Fair  Recall: [employer] of Knowledge: Fair  Language: Fair   Psychomotor Activity  Psychomotor Activity: Psychomotor Activity: Normal   Assets  Assets: Communication Skills; Desire for Improvement; Financial Resources/Insurance; Leisure Time; Physical Health; Housing   Sleep  Sleep: Sleep: Fair Number of Hours of Sleep: 6   Nutritional Assessment (For OBS and FBC admissions only) Has the patient had a weight loss or gain of 10 pounds or more in the last 3 months?: No Has the patient had a decrease in food intake/or appetite?: No Does the patient have dental problems?: No Does the patient have eating habits or behaviors that may be indicators of an eating disorder including binging or inducing vomiting?: No Has the patient recently lost weight without trying?: 0 Has the patient been eating poorly because of a decreased appetite?: 0 Malnutrition Screening Tool Score: 0    Physical Exam  Physical Exam HENT:     Head: Normocephalic.     Nose: Nose normal.  Eyes:     Conjunctiva/sclera: Conjunctivae normal.  Cardiovascular:     Rate and Rhythm: Tachycardia present.  Pulmonary:  Effort: Pulmonary effort is normal.  Musculoskeletal:        General: Normal range of motion.     Cervical back: Normal range of motion.   Neurological:     Mental Status: He is alert and oriented to person, place, and time.    Review of Systems  Constitutional: Negative.   HENT: Negative.    Eyes: Negative.   Respiratory: Negative.    Cardiovascular: Negative.   Gastrointestinal: Negative.   Genitourinary: Negative.   Musculoskeletal: Negative.   Neurological: Negative.   Endo/Heme/Allergies: Negative.   Psychiatric/Behavioral:  Positive for depression and substance abuse.    Blood pressure 129/65, pulse 62, temperature 98.5 F (36.9 C), temperature source Oral, resp. rate 18, SpO2 100%. There is no height or weight on file to calculate BMI.   Plan Of Care/Follow-up recommendations:    Disposition: Transfer to [facility]  Layla Barter, NP 09/16/2023, 8:57 AM

## 2023-09-16 NOTE — ED Notes (Signed)
Pt asleep at this hour. No apparent distress. RR even and unlabored. Monitored for safety.  

## 2023-09-16 NOTE — ED Notes (Signed)
Patient alert & oriented x4. Denies intent to harm self or others when asked. Denies A/VH. Patient denies any physical complaints when asked. No acute distress noted. Support and encouragement provided. Patient plan to transfer to [facility] for detox treatment. Routine safety checks conducted per facility protocol. Encouraged patient to notify staff if any thoughts of harm towards self or others arise. Patient verbalizes understanding and agreement.

## 2023-09-16 NOTE — ED Notes (Signed)
Pt here for substance abuse detox. Pt UDS positive for cocaine. BAL <10. Pt states that he is anxious about being here. Last drink was last night and he had 2. Last cocaine use was last night. Pt denies withdrawal symptoms. Denies SI, HI, AVH and pain. Endorses anxiety and denies depression. Pt is in Adult Bed 2.

## 2023-09-16 NOTE — Group Note (Signed)
Group Topic: Wellness  Group Date: 09/16/2023 Start Time: 0800 End Time: 0830 Facilitators: Quinn Axe, NT  Department: [facility]  Number of Participants: 4  Group Focus: clarity of thought, communication, self-awareness, and social skills Treatment Modality:  Cognitive Behavioral Therapy and Spiritual Interventions utilized were clarification and patient education Purpose: enhance coping skills, express feelings, and increase insight  Name: Robert Chan Date of Birth: July 19, 1956  MR: [id number]    Level of Participation: pt was sleep did not participate  Quality of Participation: n/a Interactions with others: n/a Mood/Affect: n/a Triggers (if applicable): n/a Cognition: n/a Progress: Other Response: n/a Plan: patient will be encouraged to follow scheduled programming on the unit.  Patients Problems:  Patient Active Problem List   Diagnosis Date Noted   Alcohol use with alcohol-induced mood disorder (HCC) 09/16/2023   MDD (major depressive disorder), recurrent severe, without psychosis (HCC) 08/08/2023   MDD (major depressive disorder), recurrent episode, mild (HCC) 07/12/2023   Cocaine use 05/25/2023   PAD (peripheral artery disease) (HCC) 12/01/2021   Atopic dermatitis 04/22/2021   Former tobacco use    Polysubstance abuse (HCC) 09/06/2020   Vitamin D deficiency 06/23/2020   Coronary artery disease 06/19/2020   History of non-ST elevation myocardial infarction (NSTEMI)    Foot callus 12/12/2019   Age-related nuclear cataract of right eye 09/13/2016   Benign hypertension 07/06/2016   Glaucoma 07/06/2016   Male erectile disorder 07/06/2016   Asthma 10/18/2012   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

## 2023-09-16 NOTE — ED Notes (Signed)
SPIRITUALITY GROUP NOTE  Spirituality group facilitated by Wilkie Aye, MDiv, BCC.  Group Description: Group focused on topic of hope. Patients participated in facilitated discussion around topic, connecting with one another around experiences and definitions for hope. Group members engaged with visual explorer photos, reflecting on what hope looks like for them today. Group engaged in discussion around how their definitions of hope are present today in hospital.  Modalities: Psycho-social ed, Adlerian, Narrative, MI  Patient Progress: Robert Chan was present throughout group.  Engaged in group discussion appropriately.

## 2023-09-16 NOTE — ED Notes (Signed)
Patient in the bedroom calm and sleeping. NAD. Respirations even and and unlabored. Will continue to monitor for safety.

## 2023-09-16 NOTE — ED Notes (Signed)
 Patient resting in lounger with eyes closed, respirations even and unlabored. Patient in no apparent acute distress. Environment secured. Safety checks in place per facility protocol.

## 2023-09-16 NOTE — Group Note (Signed)
Group Topic: Wellness  Group Date: 09/16/2023 Start Time: 1340 End Time: 1400 Facilitators: Dickie La, RN  Department: [facility]  Number of Participants: 2  Group Focus: relapse prevention and self-awareness Treatment Modality:  Psychoeducation  Interventions utilized were patient education, problem solving, and support Purpose: increase insight  Name: Robert Chan Date of Birth: July 13, 1956  MR: [id number]    Level of Participation: Pt did not attend group. Quality of Participation:  Interactions with others:  Mood/Affect:  Triggers (if applicable):  Cognition:  Progress:  Response:  Plan: follow-up needed  Patients Problems:  Patient Active Problem List   Diagnosis Date Noted   Alcohol use with alcohol-induced mood disorder (HCC) 09/16/2023   MDD (major depressive disorder), recurrent severe, without psychosis (HCC) 08/08/2023   MDD (major depressive disorder), recurrent episode, mild (HCC) 07/12/2023   Cocaine use 05/25/2023   PAD (peripheral artery disease) (HCC) 12/01/2021   Atopic dermatitis 04/22/2021   Former tobacco use    Polysubstance abuse (HCC) 09/06/2020   Vitamin D deficiency 06/23/2020   Coronary artery disease 06/19/2020   History of non-ST elevation myocardial infarction (NSTEMI)    Foot callus 12/12/2019   Age-related nuclear cataract of right eye 09/13/2016   Benign hypertension 07/06/2016   Glaucoma 07/06/2016   Male erectile disorder 07/06/2016   Asthma 10/18/2012   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

## 2023-09-16 NOTE — Progress Notes (Addendum)
Pt was transferred from [facility] and admitted to [facility] due substance abuse and ETOH. Pt is alert and oriented X4. Pt is ambulatory and is oriented to staff/unit. Pt was cooperative with admission process and skin assessment. Scabs were noted on pt's bilateral shin. Pt denies pain and current SI/HI/AVH, plan or intent. 15 minutes safety checks initiated per order. Staff will monitor for pt's safety.

## 2023-09-16 NOTE — Progress Notes (Signed)
Pt is currently in the dayroom watching TV. No distress noted or concerns voiced. Staff will monitor for pt's safety.

## 2023-09-16 NOTE — Care Management (Addendum)
[facility] Care Management   Writer met with patient, Dr. Standley Brooking and Dr. Lucianne Muss.  Patient reports that he would like to receive outpatient group therapy.   Writer referred him to the CD-IOP program with [facility] and he has an intake appointment with Annette Stable on Wednesday, 09/21/23, at 1 p.m. at [facility]  [address]  [place]

## 2023-09-16 NOTE — ED Provider Notes (Signed)
FBC/OBS ASAP Discharge Summary  Date and Time: 09/16/2023 10:03 AM  Name: Robert Chan  MRN:  [id number]   Discharge Diagnoses:  Final diagnoses:  Alcohol-induced mood disorder (HCC)  COPD with chronic bronchitis (HCC)  Moderate episode of recurrent major depressive disorder (HCC)  PAD (peripheral artery disease) (HCC)  Essential hypertension  Cocaine use disorder (HCC)  Tobacco use disorder    Subjective: Patient seen and evaluated face-to-face by this provider, chart reviewed and case discussed with Dr. Lucianne Muss. On evaluation, patient is lying down in bed in no acute distress. He is alert and oriented x 4. His thought process is linear and speech is clear and coherent. His mood is dysphoric/anxious and affect is congruent. He denies SI/HI/AVH. There is no objective evidence that the patient is currently responding to internal or external stimuli. He reports fair sleep. He reports fair appetite. He depressive symptoms. He reports feeling anxious this morning and rates his anxiety at 8 out of 10 with 10 being the worst. He denies alcohol withdrawal symptoms. He denies physical complaints. He denies medication side effects. He states that he would like to continue alcohol detox. He expresses no interest in residential treatment and states that he is interested in intensive outpatient substance abuse treatment. Patient to transfer to the facility based crisis unit to continue treatment.  Stay Summary: Robert Chan is a 67 year old male patient with a psychiatric history significant for MDD, alcohol abuse, cocaine use disorder and a medical history of hypertension who presented to the [facility] behavioral health urgent care on 09/15/2023 requesting detox from alcohol and cocaine use.  Per chart review, patient reported drinking 2 to 3 ounces bottles of alcohol per day. UDS positive for cocaine on arrival.  Total Time spent with patient: 15 minutes  Past Psychiatric History: History of  MDD, alcohol use disorder, cocaine use disorder and SI.  Past Medical History: Per chart review, history of hypertension, PAD, asthma, CAD, COPD, CKD  Family Psychiatric History: Patient states that his 2 brothers died from alcohol and drug use.  Social History: Patient states that he lives alone. He is unemployed. Cocaine and alcohol use.  Tobacco Cessation:  A prescription for an FDA-approved tobacco cessation medication provided at discharge  Current Medications:  Current Facility-Administered Medications  Medication Dose Route Frequency Provider Last Rate Last Admin   acetaminophen (TYLENOL) tablet 650 mg  650 mg Oral Q6H PRN Seini Lannom L, NP       albuterol (VENTOLIN HFA) 108 (90 Base) MCG/ACT inhaler 2 puff  2 puff Inhalation Q6H PRN Keegan Bensch L, NP       alum & mag hydroxide-simeth (MAALOX/MYLANTA) 200-200-20 MG/5ML suspension 30 mL  30 mL Oral Q4H PRN Chea Malan L, NP       [START ON 09/17/2023] ARIPiprazole (ABILIFY) tablet 5 mg  5 mg Oral Daily Bayleigh Loflin L, NP       [START ON 09/17/2023] atorvastatin (LIPITOR) tablet 80 mg  80 mg Oral Daily Reyn Faivre L, NP       cloNIDine (CATAPRES) tablet 0.1 mg  0.1 mg Oral BID PRN Cannie Muckle L, NP       [START ON 09/17/2023] diltiazem (CARDIZEM CD) 24 hr capsule 180 mg  180 mg Oral Daily Lelani Garnett L, NP       hydrOXYzine (ATARAX) tablet 50 mg  50 mg Oral TID PRN Chevez Sambrano L, NP       loperamide (IMODIUM) capsule 2-4 mg  2-4 mg Oral PRN Liborio Nixon  L, NP       LORazepam (ATIVAN) tablet 1 mg  1 mg Oral Q6H PRN Nicholai Willette L, NP       LORazepam (ATIVAN) tablet 1 mg  1 mg Oral QID Maxwell Lemen L, NP       Followed by   Melene Muller ON 09/17/2023] LORazepam (ATIVAN) tablet 1 mg  1 mg Oral TID Nicosha Struve L, NP       Followed by   Melene Muller ON 09/18/2023] LORazepam (ATIVAN) tablet 1 mg  1 mg Oral BID Issaih Kaus L, NP       Followed by   Melene Muller ON 09/19/2023] LORazepam (ATIVAN) tablet 1 mg  1 mg Oral Daily  Smith Mcnicholas L, NP       magnesium hydroxide (MILK OF MAGNESIA) suspension 30 mL  30 mL Oral Daily PRN Georgianna Band L, NP       mometasone-formoterol (DULERA) 200-5 MCG/ACT inhaler 2 puff  2 puff Inhalation BID Navya Timmons L, NP       [START ON 09/17/2023] multivitamin with minerals tablet 1 tablet  1 tablet Oral Daily Sang Blount L, NP       [START ON 09/17/2023] nicotine (NICODERM CQ - dosed in mg/24 hours) patch 21 mg  21 mg Transdermal Q0600 Jahmeir Geisen L, NP       ondansetron (ZOFRAN-ODT) disintegrating tablet 4 mg  4 mg Oral Q6H PRN Roxana Lai L, NP       [START ON 09/17/2023] thiamine (VITAMIN B1) tablet 100 mg  100 mg Oral Daily Tanelle Lanzo L, NP       traZODone (DESYREL) tablet 50 mg  50 mg Oral QHS PRN Kathye Cipriani L, NP       Current Outpatient Medications  Medication Sig Dispense Refill   albuterol (VENTOLIN HFA) 108 (90 Base) MCG/ACT inhaler Inhale 2 puffs into the lungs every 6 (six) hours as needed for shortness of breath. 18 g 1   ARIPiprazole (ABILIFY) 5 MG tablet Take 1 tablet (5 mg total) by mouth daily. 30 tablet 0   atorvastatin (LIPITOR) 80 MG tablet Take 1 tablet (80 mg total) by mouth daily. 30 tablet 0   diltiazem (CARDIZEM CD) 180 MG 24 hr capsule Take 1 capsule (180 mg total) by mouth daily. 30 capsule 0   doxepin (SINEQUAN) 75 MG capsule Take 1 capsule (75 mg total) by mouth at bedtime. 30 capsule 0   hydrOXYzine (ATARAX) 50 MG tablet Take 1 tablet (50 mg total) by mouth 3 (three) times daily as needed for anxiety. 30 tablet 0   latanoprost (XALATAN) 0.005 % ophthalmic solution Place 1 drop into both eyes at bedtime. 2.5 mL 12   mometasone-formoterol (DULERA) 200-5 MCG/ACT AERO Inhale 2 puffs into the lungs 2 (two) times daily. 1 each 1   traZODone (DESYREL) 50 MG tablet Take 1 tablet (50 mg total) by mouth at bedtime as needed for sleep. 30 tablet 0    PTA Medications:  Facility Ordered Medications  Medication   acetaminophen (TYLENOL)  tablet 650 mg   albuterol (VENTOLIN HFA) 108 (90 Base) MCG/ACT inhaler 2 puff   alum & mag hydroxide-simeth (MAALOX/MYLANTA) 200-200-20 MG/5ML suspension 30 mL   [START ON 09/17/2023] ARIPiprazole (ABILIFY) tablet 5 mg   [START ON 09/17/2023] atorvastatin (LIPITOR) tablet 80 mg   cloNIDine (CATAPRES) tablet 0.1 mg   [START ON 09/17/2023] diltiazem (CARDIZEM CD) 24 hr capsule 180 mg   hydrOXYzine (ATARAX) tablet 50 mg   loperamide (IMODIUM) capsule 2-4 mg  LORazepam (ATIVAN) tablet 1 mg   LORazepam (ATIVAN) tablet 1 mg   Followed by   Melene Muller ON 09/17/2023] LORazepam (ATIVAN) tablet 1 mg   Followed by   Melene Muller ON 09/18/2023] LORazepam (ATIVAN) tablet 1 mg   Followed by   Melene Muller ON 09/19/2023] LORazepam (ATIVAN) tablet 1 mg   magnesium hydroxide (MILK OF MAGNESIA) suspension 30 mL   mometasone-formoterol (DULERA) 200-5 MCG/ACT inhaler 2 puff   [START ON 09/17/2023] multivitamin with minerals tablet 1 tablet   [START ON 09/17/2023] nicotine (NICODERM CQ - dosed in mg/24 hours) patch 21 mg   ondansetron (ZOFRAN-ODT) disintegrating tablet 4 mg   [START ON 09/17/2023] thiamine (VITAMIN B1) tablet 100 mg   traZODone (DESYREL) tablet 50 mg   PTA Medications  Medication Sig   atorvastatin (LIPITOR) 80 MG tablet Take 1 tablet (80 mg total) by mouth daily.   diltiazem (CARDIZEM CD) 180 MG 24 hr capsule Take 1 capsule (180 mg total) by mouth daily.   ARIPiprazole (ABILIFY) 5 MG tablet Take 1 tablet (5 mg total) by mouth daily.   doxepin (SINEQUAN) 75 MG capsule Take 1 capsule (75 mg total) by mouth at bedtime.   hydrOXYzine (ATARAX) 50 MG tablet Take 1 tablet (50 mg total) by mouth 3 (three) times daily as needed for anxiety.   traZODone (DESYREL) 50 MG tablet Take 1 tablet (50 mg total) by mouth at bedtime as needed for sleep.   albuterol (VENTOLIN HFA) 108 (90 Base) MCG/ACT inhaler Inhale 2 puffs into the lungs every 6 (six) hours as needed for shortness of breath.   mometasone-formoterol  (DULERA) 200-5 MCG/ACT AERO Inhale 2 puffs into the lungs 2 (two) times daily.   latanoprost (XALATAN) 0.005 % ophthalmic solution Place 1 drop into both eyes at bedtime.       10/19/2022    2:53 PM 05/11/2022    2:30 PM 03/31/2022    2:15 PM  Depression screen PHQ 2/9  Decreased Interest 0 1 0  Down, Depressed, Hopeless 0 1 0  PHQ - 2 Score 0 2 0  Altered sleeping  0   Tired, decreased energy  1   Change in appetite  1   Trouble concentrating  1   Moving slowly or fidgety/restless  0   Suicidal thoughts  0   PHQ-9 Score  5     Flowsheet Row ED from 09/15/2023 in [facility] Most recent reading at 09/15/2023  5:08 PM Admission (Discharged) from 08/08/2023 in [facility] Most recent reading at 08/09/2023 12:00 AM ED from 08/08/2023 in [facility] Emergency Department at [facility] Most recent reading at 08/08/2023 12:06 PM  C-SSRS RISK CATEGORY No Risk High Risk High Risk       Musculoskeletal  Strength & Muscle Tone: within normal limits Gait & Station: normal Patient leans: N/A  Psychiatric Specialty Exam  Presentation  General Appearance:  Appropriate for Environment  Eye Contact: Fair  Speech: Clear and Coherent  Speech Volume: Normal  Handedness: Right   Mood and Affect  Mood: Dysphoric  Affect: Congruent   Thought Process  Thought Processes: Coherent  Descriptions of Associations:Intact  Orientation:Full (Time, Place and Person)  Thought Content:Logical  Diagnosis of Schizophrenia or Schizoaffective disorder in past: No    Hallucinations:Hallucinations: None  Ideas of Reference:None  Suicidal Thoughts:Suicidal Thoughts: No  Homicidal Thoughts:Homicidal Thoughts: No   Sensorium  Memory: Immediate Fair; Recent Fair; Remote Fair  Judgment: Fair  Insight: Curator  Functions  Concentration: Fair  Attention Span: Fair  Recall: [employer] of  Knowledge: Fair  Language: Fair   Psychomotor Activity  Psychomotor Activity: Psychomotor Activity: Normal   Assets  Assets: Manufacturing systems engineer; Desire for Improvement; Financial Resources/Insurance; Leisure Time; Physical Health; Housing   Sleep  Sleep: Sleep: Fair Number of Hours of Sleep: 6   Nutritional Assessment (For OBS and FBC admissions only) Has the patient had a weight loss or gain of 10 pounds or more in the last 3 months?: No Has the patient had a decrease in food intake/or appetite?: No Does the patient have dental problems?: No Does the patient have eating habits or behaviors that may be indicators of an eating disorder including binging or inducing vomiting?: No Has the patient recently lost weight without trying?: 0 Has the patient been eating poorly because of a decreased appetite?: 0 Malnutrition Screening Tool Score: 0    Physical Exam  Physical Exam HENT:     Head: Normocephalic.     Nose: Nose normal.  Eyes:     Conjunctiva/sclera: Conjunctivae normal.  Cardiovascular:     Rate and Rhythm: Tachycardia present.  Pulmonary:     Effort: Pulmonary effort is normal.  Musculoskeletal:        General: Normal range of motion.     Cervical back: Normal range of motion.  Neurological:     Mental Status: He is alert and oriented to person, place, and time.    Review of Systems  Constitutional: Negative.   HENT: Negative.    Eyes: Negative.   Respiratory: Negative.    Cardiovascular: Negative.   Gastrointestinal: Negative.   Genitourinary: Negative.   Musculoskeletal: Negative.   Neurological: Negative.   Endo/Heme/Allergies: Negative.   Psychiatric/Behavioral:  Positive for depression and substance abuse.    There were no vitals taken for this visit. There is no height or weight on file to calculate BMI.   Plan Of Care/Follow-up recommendations:  Current medication regimen continued at the time of transfer to the  [facility]  Disposition: Transfer to [facility]  Bearett Porcaro L, NP 09/16/2023, 10:03 AM

## 2023-09-16 NOTE — ED Notes (Signed)
Patient transferring to [facility]

## 2023-09-17 DIAGNOSIS — J4489 Other specified chronic obstructive pulmonary disease: Secondary | ICD-10-CM | POA: Insufficient documentation

## 2023-09-17 DIAGNOSIS — I10 Essential (primary) hypertension: Secondary | ICD-10-CM | POA: Insufficient documentation

## 2023-09-17 DIAGNOSIS — F1094 Alcohol use, unspecified with alcohol-induced mood disorder: Secondary | ICD-10-CM | POA: Diagnosis not present

## 2023-09-17 DIAGNOSIS — F1014 Alcohol abuse with alcohol-induced mood disorder: Secondary | ICD-10-CM | POA: Insufficient documentation

## 2023-09-17 DIAGNOSIS — I2510 Atherosclerotic heart disease of native coronary artery without angina pectoris: Secondary | ICD-10-CM | POA: Insufficient documentation

## 2023-09-17 DIAGNOSIS — T43206A Underdosing of unspecified antidepressants, initial encounter: Secondary | ICD-10-CM | POA: Insufficient documentation

## 2023-09-17 DIAGNOSIS — F331 Major depressive disorder, recurrent, moderate: Secondary | ICD-10-CM | POA: Insufficient documentation

## 2023-09-17 DIAGNOSIS — F1490 Cocaine use, unspecified, uncomplicated: Secondary | ICD-10-CM | POA: Insufficient documentation

## 2023-09-17 DIAGNOSIS — Z91128 Patient's intentional underdosing of medication regimen for other reason: Secondary | ICD-10-CM | POA: Insufficient documentation

## 2023-09-17 DIAGNOSIS — I252 Old myocardial infarction: Secondary | ICD-10-CM | POA: Insufficient documentation

## 2023-09-17 DIAGNOSIS — F17210 Nicotine dependence, cigarettes, uncomplicated: Secondary | ICD-10-CM | POA: Insufficient documentation

## 2023-09-17 DIAGNOSIS — I739 Peripheral vascular disease, unspecified: Secondary | ICD-10-CM | POA: Insufficient documentation

## 2023-09-17 DIAGNOSIS — H269 Unspecified cataract: Secondary | ICD-10-CM | POA: Insufficient documentation

## 2023-09-17 DIAGNOSIS — E785 Hyperlipidemia, unspecified: Secondary | ICD-10-CM | POA: Insufficient documentation

## 2023-09-17 MED ORDER — LATANOPROST 0.005 % OP SOLN
1.0000 [drp] | Freq: Every day | OPHTHALMIC | Status: DC
  Administered 2023-09-18 – 2023-09-20 (×3): 1 [drp] via OPHTHALMIC
  Filled 2023-09-17 (×3): qty 2.5

## 2023-09-17 NOTE — Group Note (Signed)
Group Topic: Relapse and Recovery  Group Date: 09/17/2023 Start Time: 2000 End Time: 2100 Facilitators: Darin Engels  Department: [facility]  Number of Participants: 2  Group Focus: abuse issues, acceptance, and coping skills Treatment Modality:  Leisure Development Interventions utilized were leisure development and story telling Purpose: enhance coping skills, express feelings, and relapse prevention strategies  Name: Robert Chan Date of Birth: 1955-12-14  MR: [id number]    Level of Participation: active Quality of Participation: attentive, cooperative, and engaged Interactions with others: gave feedback Mood/Affect: appropriate and positive Triggers (if applicable): n/a Cognition: coherent/clear Progress: Gaining insight Response: Pt was very engaged in AA, he offered minimal input but listened intently. Pt interacts with other pts and staff appropriately.  Plan: patient will be encouraged to continue attending groups.   Patients Problems:  Patient Active Problem List   Diagnosis Date Noted   Alcohol use with alcohol-induced mood disorder (HCC) 09/16/2023   MDD (major depressive disorder), recurrent severe, without psychosis (HCC) 08/08/2023   MDD (major depressive disorder), recurrent episode, mild (HCC) 07/12/2023   Cocaine use 05/25/2023   PAD (peripheral artery disease) (HCC) 12/01/2021   Atopic dermatitis 04/22/2021   Former tobacco use    Polysubstance abuse (HCC) 09/06/2020   Vitamin D deficiency 06/23/2020   Coronary artery disease 06/19/2020   History of non-ST elevation myocardial infarction (NSTEMI)    Foot callus 12/12/2019   Age-related nuclear cataract of right eye 09/13/2016   Benign hypertension 07/06/2016   Glaucoma 07/06/2016   Male erectile disorder 07/06/2016   Asthma 10/18/2012   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

## 2023-09-17 NOTE — ED Notes (Signed)
Patient ate lunch and  attended group.  He is calm and quiet presently sitting in day room watching TV.  No complaints or distress.

## 2023-09-17 NOTE — ED Notes (Signed)
Patient is awake and alert sitting in dayroom at this time.  He ate breakfast and is now watching tv with peers.  No complaints or distress.  Will monitor and provide support as needed.

## 2023-09-17 NOTE — ED Notes (Signed)
Patient in the bedroom calm and sleeping. NAD. Respirations even and and unlabored. Will continue to monitor for safety.

## 2023-09-17 NOTE — ED Notes (Signed)
Patient is presently attending an AA meeting.  He is calm and cooperative with care.  Patient is quiet and pleasant on approach.  No evidence of withdrawal.  Will monitor.

## 2023-09-17 NOTE — Group Note (Signed)
Group Topic: Recovery Basics  Group Date: 09/17/2023 Start Time: 1200 End Time: 1240 Facilitators: Jenean Lindau, RN  Department: [facility]  Number of Participants: 4  Group Focus: chemical dependency education Treatment Modality:  Behavior Modification Therapy Interventions utilized were assignment, clarification, and patient education Purpose: enhance coping skills, explore maladaptive thinking, express feelings, express irrational fears, improve communication skills, increase insight, and relapse prevention strategies  Name: Robert Chan Date of Birth: 18-Nov-1955  MR: [id number]    Level of Participation: minimal Quality of Participation: attentive Interactions with others: gave feedback Mood/Affect: appropriate Triggers (if applicable):   Cognition: goal directed Progress: Gaining insight Response:   Plan: follow-up needed  Patients Problems:  Patient Active Problem List   Diagnosis Date Noted   Alcohol use with alcohol-induced mood disorder (HCC) 09/16/2023   MDD (major depressive disorder), recurrent severe, without psychosis (HCC) 08/08/2023   MDD (major depressive disorder), recurrent episode, mild (HCC) 07/12/2023   Cocaine use 05/25/2023   PAD (peripheral artery disease) (HCC) 12/01/2021   Atopic dermatitis 04/22/2021   Former tobacco use    Polysubstance abuse (HCC) 09/06/2020   Vitamin D deficiency 06/23/2020   Coronary artery disease 06/19/2020   History of non-ST elevation myocardial infarction (NSTEMI)    Foot callus 12/12/2019   Age-related nuclear cataract of right eye 09/13/2016   Benign hypertension 07/06/2016   Glaucoma 07/06/2016   Male erectile disorder 07/06/2016   Asthma 10/18/2012   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

## 2023-09-17 NOTE — ED Provider Notes (Signed)
[facility] Progress Note  Date & Time: 09/17/2023 11:08 AM Name: Robert Chan Age: 67 y.o.  DOB: 02/09/1956  MRN: [id number]  Diagnosis:  Final diagnoses:  Alcohol-induced mood disorder (HCC)  COPD with chronic bronchitis (HCC)  Moderate episode of recurrent major depressive disorder (HCC)  PAD (peripheral artery disease) (HCC)  Essential hypertension  Cocaine use disorder (HCC)  Tobacco use disorder    Reason for presentation: detox from alcohol and cocaine  Brief HPI  Robert Chan is a 67 y.o. male, with past psychiatric history history of multiple inpatient hospitalizations (x3 2024, most recent in October), major depressive disorder, cocaine use disorder, alcohol use disorder and past medical history of COPD, hypertension, NSTEMI, CAD, who presented Voluntary to [facility] (09/17/2023) then admitted to [facility] for detox from alcohol and cocaine. He is not interested in residential rehab.      Component Value Date/Time   ETH <10 09/15/2023 1652       Component Value Date/Time   LABOPIA NONE DETECTED 08/08/2023 1730   COCAINSCRNUR POSITIVE (A) 08/08/2023 1730   LABBENZ NONE DETECTED 08/08/2023 1730   AMPHETMU NONE DETECTED 08/08/2023 1730   THCU NONE DETECTED 08/08/2023 1730   LABBARB NONE DETECTED 08/08/2023 1730     Interval Hx   Patient Narrative:  On assessment today, patient reports that he is doing well overall.  He reports a good mood.  He is tired, but sleeping well and his appetite is intact.  He denies SI, HI, and AVH.  We spent time discussing his discharge plans, and he reiterates that at this time, he is only interested in intensive outpatient due to the desire to spend the holidays with family.  He does say that after the holidays, he intends to seek residential substance use treatment at day [facility].  He requests his eyedrops be added to his medication regimen but otherwise denies somatic nor other acute concerns or  complaints today.  ROS   Past History   Psychiatric History:  Patient reports a history of depression. He reports a past psychiatric hospitalization in October 2020 for and a suicide attempt "a while ago."  Per chart review patient has had multiple inpatient hospitalizations for major depressive disorder with suicidal ideations, he has had 3 within the past year.  He has had medication trials of Abilify, doxepin, Remeron, Wellbutrin.  He has been noncompliant with medications after previous discharges and with outpatient follow-up.  PCP: Robert Frisk, MD   Psychiatric Family History:  Patient states that his 2 brothers died from alcohol and drug use.   Social History:   he lives by himself. He reports that he supports himself with disability check from [employer]. He reports that he lives in an apartment in [place]. He reports that he has been separated from his wife for a while. He reports that he has a daughter in [place] and 6 grandchildren. He reports that his family is in [place]   EtOH:  reports current alcohol use. Began in early teens and drinking approximately 2 to 3 ounce bottles of alcohol per day.  Tobacco:  reports that he has been smoking cigarettes. He has never used smokeless tobacco.  Cannabis: Denies Opiates: Denies Stimulants: Cocaine- did not specify quantity BZO/hypnotics: Denies Seizure/DT: Denies  Treatments: rehab in [place] 8 to 9 years ago  IVDU: Denies  Past Medical History:  Past Medical History:  Diagnosis Date   Acute bilateral low back pain without sciatica 02/27/2021   Asthma  CAD (coronary artery disease)    Cataract    CKD (chronic kidney disease)    Cocaine use disorder, mild, abuse (HCC)    COPD (chronic obstructive pulmonary disease) (HCC)    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   Depression    Elevated serum creatinine 08/28/2019   Homelessness 06/19/2020   Homicidal ideations 05/25/2023   Hypertension    Major  depressive disorder, recurrent episode, 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/11/2022   Major depressive disorder, single episode, 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/10/2022   MDD (major depressive disorder), recurrent severe, 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   NSTEMI (non-ST elevated myocardial infarction) (HCC)    Schizoaffective disorder (HCC) 11/08/2022   Status post incision and drainage 04/22/2021   Suicidal ideation 06/05/2020   Suicidal ideation 06/05/2020    Past Surgical History:  Procedure Laterality Date   APPENDECTOMY     EYE SURGERY     LEFT HEART CATH AND CORONARY ANGIOGRAPHY N/A 06/05/2020   Procedure: LEFT HEART CATH AND CORONARY ANGIOGRAPHY;  Surgeon: Elder Negus, MD;  Location: [facility] INVASIVE [facility] LAB;  Service: Cardiovascular;  Laterality: N/A;   PROSTATE SURGERY     Family History:  Family History  Problem Relation Age of Onset   Hypertension Mother    Diabetes Mother    Hypertension Father    Hypertension Sister    Social History   Substance and Sexual Activity  Alcohol Use Yes   Comment: 4 40oz beer a week    Social History   Substance and Sexual Activity  Drug Use Not Currently   Types: Cocaine   Comment: cocaine use once a month, last use 6 mos ago    Social History   Socioeconomic History   Marital status: Single    Spouse name: Not on file   Number of children: Not on file   Years of education: Not on file   Highest education level: Not on file  Occupational History   Not on file  Tobacco Use   Smoking status: Some Days    Current packs/day: 0.00    Types: Cigarettes    Last attempt to quit: 12/25/2020    Years since quitting: 2.7   Smokeless tobacco: Never   Tobacco comments:    Smokes a couple cigarettes every now and then  Vaping Use   Vaping status: Never Used  Substance and Sexual Activity   Alcohol use: Yes    Comment: 4 40oz beer a week   Drug use: Not Currently    Types: Cocaine    Comment: cocaine use once a month, last use 6 mos ago   Sexual activity:  Not Currently  Other Topics Concern   Not on file  Social History Narrative   His mother passed at age 41 (when he was 32 years old) and his father raised him and his siblings   His parents were pastors as a lot of his sisters and other family members are today   There was a total of 13 children in his family Had 64 sisters, one sister deceased , Had 3 brothers, all brothers deceased   He is the youngest of the 32 and was "always in trouble in my younger years"   Family still live in [place], [place], some in [place], [place]   Has a Daughter and son with a total of 10 grandchildren (6 grand daughters local and 4 grandsons)    Values family   Married twice, present wife incarcerated   Social Determinants of Health  Financial Resource Strain: Low Risk  (10/19/2022)   Overall Financial Resource Strain (CARDIA)    Difficulty of Paying Living Expenses: Not hard at all  Food Insecurity: Food Insecurity Present (09/16/2023)   Hunger Vital Sign    Worried About Running Out of Food in the Last Year: Sometimes true    Ran Out of Food in the Last Year: Sometimes true  Transportation Needs: Unmet Transportation Needs (09/16/2023)   PRAPARE - Administrator, Civil Service (Medical): Yes    Lack of Transportation (Non-Medical): Yes  Physical Activity: Inactive (10/19/2022)   Exercise Vital Sign    Days of Exercise per Week: 0 days    Minutes of Exercise per Session: 0 min  Stress: No Stress Concern Present (10/19/2022)   [name] of Occupational Health - Occupational Stress Questionnaire    Feeling of Stress : Not at all  Social Connections: Moderately Integrated (03/31/2022)   Social Connection and Isolation Panel [NHANES]    Frequency of Communication with Friends and Family: Three times a week    Frequency of Social Gatherings with Friends and Family: Three times a week    Attends Religious Services: 1 to 4 times per year    Active Member of Clubs or Organizations: Yes     Attends Banker Meetings: 1 to 4 times per year    Marital Status: Never married   SDOH: SDOH Screenings   Food Insecurity: Food Insecurity Present (09/16/2023)  Housing: Patient Declined (09/16/2023)  Transportation Needs: Unmet Transportation Needs (09/16/2023)  Utilities: Not At Risk (09/16/2023)  Alcohol Screen: Low Risk  (09/16/2023)  Recent Concern: Alcohol Screen - High Risk (08/08/2023)  Depression (PHQ2-9): Low Risk  (09/16/2023)  Financial Resource Strain: Low Risk  (10/19/2022)  Physical Activity: Inactive (10/19/2022)  Social Connections: Moderately Integrated (03/31/2022)  Stress: No Stress Concern Present (10/19/2022)  Tobacco Use: High Risk (08/09/2023)   Additional Social History:   Current Medications   Current Facility-Administered Medications  Medication Dose Route Frequency Provider Last Rate Last Admin   acetaminophen (TYLENOL) tablet 650 mg  650 mg Oral Q6H PRN White, Patrice L, NP       albuterol (VENTOLIN HFA) 108 (90 Base) MCG/ACT inhaler 2 puff  2 puff Inhalation Q6H PRN White, Patrice L, NP       alum & mag hydroxide-simeth (MAALOX/MYLANTA) 200-200-20 MG/5ML suspension 30 mL  30 mL Oral Q4H PRN White, Patrice L, NP       ARIPiprazole (ABILIFY) tablet 5 mg  5 mg Oral Daily White, Patrice L, NP   5 mg at 09/17/23 0956   atorvastatin (LIPITOR) tablet 80 mg  80 mg Oral Daily White, Patrice L, NP   80 mg at 09/17/23 0956   cloNIDine (CATAPRES) tablet 0.1 mg  0.1 mg Oral BID PRN White, Patrice L, NP       diltiazem (CARDIZEM CD) 24 hr capsule 180 mg  180 mg Oral Daily White, Patrice L, NP   180 mg at 09/17/23 0956   hydrOXYzine (ATARAX) tablet 50 mg  50 mg Oral TID PRN White, Patrice L, NP       latanoprost (XALATAN) 0.005 % ophthalmic solution 1 drop  1 drop Both Eyes QHS Ovadia Lopp, MD       loperamide (IMODIUM) capsule 2-4 mg  2-4 mg Oral PRN White, Patrice L, NP       LORazepam (ATIVAN) tablet 1 mg  1 mg Oral Q6H PRN White, Chrystine Oiler, NP  magnesium hydroxide (MILK OF MAGNESIA) suspension 30 mL  30 mL Oral Daily PRN White, Patrice L, NP       mometasone-formoterol (DULERA) 200-5 MCG/ACT inhaler 2 puff  2 puff Inhalation BID White, Patrice L, NP   2 puff at 09/17/23 0955   multivitamin with minerals tablet 1 tablet  1 tablet Oral Daily White, Patrice L, NP   1 tablet at 09/17/23 0956   nicotine (NICODERM CQ - dosed in mg/24 hours) patch 21 mg  21 mg Transdermal Q0600 White, Patrice L, NP       ondansetron (ZOFRAN-ODT) disintegrating tablet 4 mg  4 mg Oral Q6H PRN White, Patrice L, NP       thiamine (VITAMIN B1) tablet 100 mg  100 mg Oral Daily White, Patrice L, NP   100 mg at 09/17/23 0956   traZODone (DESYREL) tablet 50 mg  50 mg Oral QHS PRN White, Patrice L, NP   50 mg at 09/16/23 2113   Current Outpatient Medications  Medication Sig Dispense Refill   albuterol (VENTOLIN HFA) 108 (90 Base) MCG/ACT inhaler Inhale 2 puffs into the lungs every 6 (six) hours as needed for shortness of breath. 18 g 1   ARIPiprazole (ABILIFY) 5 MG tablet Take 1 tablet (5 mg total) by mouth daily. 30 tablet 0   atorvastatin (LIPITOR) 80 MG tablet Take 1 tablet (80 mg total) by mouth daily. 30 tablet 0   diltiazem (CARDIZEM CD) 180 MG 24 hr capsule Take 1 capsule (180 mg total) by mouth daily. 30 capsule 0   doxepin (SINEQUAN) 75 MG capsule Take 1 capsule (75 mg total) by mouth at bedtime. 30 capsule 0   hydrOXYzine (ATARAX) 50 MG tablet Take 1 tablet (50 mg total) by mouth 3 (three) times daily as needed for anxiety. 30 tablet 0   latanoprost (XALATAN) 0.005 % ophthalmic solution Place 1 drop into both eyes at bedtime. 2.5 mL 12   mometasone-formoterol (DULERA) 200-5 MCG/ACT AERO Inhale 2 puffs into the lungs 2 (two) times daily. 1 each 1   traZODone (DESYREL) 50 MG tablet Take 1 tablet (50 mg total) by mouth at bedtime as needed for sleep. 30 tablet 0    Labs / Images  Lab Results:  Admission on 09/15/2023, Discharged on 09/16/2023  Component  Date Value Ref Range Status   WBC 09/15/2023 8.9  4.0 - 10.5 K/uL Final   RBC 09/15/2023 4.35  4.22 - 5.81 MIL/uL Final   Hemoglobin 09/15/2023 13.0  13.0 - 17.0 g/dL Final   HCT 16/08/9603 40.7  39.0 - 52.0 % Final   MCV 09/15/2023 93.6  80.0 - 100.0 fL Final   MCH 09/15/2023 29.9  26.0 - 34.0 pg Final   MCHC 09/15/2023 31.9  30.0 - 36.0 g/dL Final   RDW 54/07/8118 12.6  11.5 - 15.5 % Final   Platelets 09/15/2023 194  150 - 400 K/uL Final   nRBC 09/15/2023 0.0  0.0 - 0.2 % Final   Neutrophils Relative % 09/15/2023 52  % Final   Neutro Abs 09/15/2023 4.6  1.7 - 7.7 K/uL Final   Lymphocytes Relative 09/15/2023 36  % Final   Lymphs Abs 09/15/2023 3.2  0.7 - 4.0 K/uL Final   Monocytes Relative 09/15/2023 7  % Final   Monocytes Absolute 09/15/2023 0.7  0.1 - 1.0 K/uL Final   Eosinophils Relative 09/15/2023 4  % Final   Eosinophils Absolute 09/15/2023 0.3  0.0 - 0.5 K/uL Final   Basophils Relative 09/15/2023 1  %  Final   Basophils Absolute 09/15/2023 0.1  0.0 - 0.1 K/uL Final   Immature Granulocytes 09/15/2023 0  % Final   Abs Immature Granulocytes 09/15/2023 0.03  0.00 - 0.07 K/uL Final   Performed at [facility], [address], [place], [place] [address]   Sodium 09/15/2023 138  135 - 145 mmol/L Final   Potassium 09/15/2023 4.0  3.5 - 5.1 mmol/L Final   Chloride 09/15/2023 105  98 - 111 mmol/L Final   CO2 09/15/2023 24  22 - 32 mmol/L Final   Glucose, Bld 09/15/2023 90  70 - 99 mg/dL Final   Glucose reference range applies only to samples taken after fasting for at least 8 hours.   BUN 09/15/2023 6 (L)  8 - 23 mg/dL Final   Creatinine, Ser 09/15/2023 1.17  0.61 - 1.24 mg/dL Final   Calcium 19/14/7829 8.9  8.9 - 10.3 mg/dL Final   Total Protein 56/21/3086 7.1  6.5 - 8.1 g/dL Final   Albumin 57/84/6962 3.5  3.5 - 5.0 g/dL Final   AST 95/28/4132 19  15 - 41 U/L Final   ALT 09/15/2023 15  0 - 44 U/L Final   Alkaline Phosphatase 09/15/2023 70  38 - 126 U/L Final   Total  Bilirubin 09/15/2023 0.8  <1.2 mg/dL Final   GFR, Estimated 09/15/2023 >60  >60 mL/min Final   Comment: (NOTE) Calculated using the CKD-EPI Creatinine Equation (2021)    Anion gap 09/15/2023 9  5 - 15 Final   Performed at [facility], [address] [place], [place] [address]   Hgb A1c MFr Bld 09/15/2023 5.1  4.8 - 5.6 % Final   Comment: (NOTE) Pre diabetes:          5.7%-6.4%  Diabetes:              >6.4%  Glycemic control for   <7.0% adults with diabetes    Mean Plasma Glucose 09/15/2023 99.67  mg/dL Final   Performed at [facility], [address], [place], [place] [address]   Alcohol, Ethyl (B) 09/15/2023 <10  <10 mg/dL Final   Comment: (NOTE) Lowest detectable limit for serum alcohol is 10 mg/dL.  For medical purposes only. Performed at [facility], [address] [place], [place] [address]    Cholesterol 09/15/2023 159  0 - 200 mg/dL Final   Triglycerides 34/74/2595 53  <150 mg/dL Final   HDL 63/87/5643 61  >40 mg/dL Final   Total CHOL/HDL Ratio 09/15/2023 2.6  RATIO Final   VLDL 09/15/2023 11  0 - 40 mg/dL Final   LDL Cholesterol 09/15/2023 87  0 - 99 mg/dL Final   Comment:        Total Cholesterol/HDL:CHD Risk Coronary Heart Disease Risk Table                     Men   Women  1/2 Average Risk   3.4   3.3  Average Risk       5.0   4.4  2 X Average Risk   9.6   7.1  3 X Average Risk  23.4   11.0        Use the calculated Patient Ratio above and the CHD Risk Table to determine the patient's CHD Risk.        ATP III CLASSIFICATION (LDL):  <100     mg/dL   Optimal  329-518  mg/dL   Near or  Above                    Optimal  130-159  mg/dL   Borderline  161-096  mg/dL   High  >045     mg/dL   Very High Performed at [facility], [address], [place], [place] [address]    TSH 09/15/2023 3.515  0.350 - 4.500 uIU/mL Final   Comment: Performed by a 3rd Generation assay with a functional sensitivity of <=0.01 uIU/mL. Performed at  [facility], [address] [place], [place] [address]    POC Amphetamine UR 09/15/2023 None Detected  NONE DETECTED (Cut Off Level 1000 ng/mL) Final   POC Secobarbital (BAR) 09/15/2023 None Detected  NONE DETECTED (Cut Off Level 300 ng/mL) Final   POC Buprenorphine (BUP) 09/15/2023 None Detected  NONE DETECTED (Cut Off Level 10 ng/mL) Final   POC Oxazepam (BZO) 09/15/2023 None Detected  NONE DETECTED (Cut Off Level 300 ng/mL) Final   POC Cocaine UR 09/15/2023 Positive (A)  NONE DETECTED (Cut Off Level 300 ng/mL) Final   POC Methamphetamine UR 09/15/2023 None Detected  NONE DETECTED (Cut Off Level 1000 ng/mL) Final   POC Morphine 09/15/2023 None Detected  NONE DETECTED (Cut Off Level 300 ng/mL) Final   POC Methadone UR 09/15/2023 None Detected  NONE DETECTED (Cut Off Level 300 ng/mL) Final   POC Oxycodone UR 09/15/2023 None Detected  NONE DETECTED (Cut Off Level 100 ng/mL) Final   POC Marijuana UR 09/15/2023 None Detected  NONE DETECTED (Cut Off Level 50 ng/mL) Final  Admission on 08/08/2023, Discharged on 08/22/2023  Component Date Value Ref Range Status   Adenovirus 08/13/2023 NOT DETECTED  NOT DETECTED Final   Coronavirus 229E 08/13/2023 NOT DETECTED  NOT DETECTED Final   Comment: (NOTE) The Coronavirus on the Respiratory Panel, DOES NOT test for the novel  Coronavirus (2019 nCoV)    Coronavirus HKU1 08/13/2023 NOT DETECTED  NOT DETECTED Final   Coronavirus NL63 08/13/2023 NOT DETECTED  NOT DETECTED Final   Coronavirus OC43 08/13/2023 NOT DETECTED  NOT DETECTED Final   Metapneumovirus 08/13/2023 NOT DETECTED  NOT DETECTED Final   Rhinovirus / Enterovirus 08/13/2023 NOT DETECTED  NOT DETECTED Final   Influenza A 08/13/2023 NOT DETECTED  NOT DETECTED Final   Influenza B 08/13/2023 NOT DETECTED  NOT DETECTED Final   Parainfluenza Virus 1 08/13/2023 NOT DETECTED  NOT DETECTED Final   Parainfluenza Virus 2 08/13/2023 NOT DETECTED  NOT DETECTED Final   Parainfluenza Virus 3  08/13/2023 NOT DETECTED  NOT DETECTED Final   Parainfluenza Virus 4 08/13/2023 NOT DETECTED  NOT DETECTED Final   Respiratory Syncytial Virus 08/13/2023 NOT DETECTED  NOT DETECTED Final   Bordetella pertussis 08/13/2023 NOT DETECTED  NOT DETECTED Final   Bordetella Parapertussis 08/13/2023 NOT DETECTED  NOT DETECTED Final   Chlamydophila pneumoniae 08/13/2023 NOT DETECTED  NOT DETECTED Final   Mycoplasma pneumoniae 08/13/2023 NOT DETECTED  NOT DETECTED Final   Performed at [facility], [address], [place], [place] [address]   SARS Coronavirus 2 by RT PCR 08/14/2023 NEGATIVE  NEGATIVE Final   Comment: (NOTE) SARS-CoV-2 target nucleic acids are NOT DETECTED.  The SARS-CoV-2 RNA is generally detectable in upper and lower respiratory specimens during the acute phase of infection. The lowest concentration of SARS-CoV-2 viral copies this assay can detect is 250 copies / mL. A negative result does not preclude SARS-CoV-2 infection and should not be used as the sole basis  for treatment or other patient management decisions.  A negative result may occur with improper specimen collection / handling, submission of specimen other than nasopharyngeal swab, presence of viral mutation(s) within the areas targeted by this assay, and inadequate number of viral copies (<250 copies / mL). A negative result must be combined with clinical observations, patient history, and epidemiological information.  Fact Sheet for Patients:   RoadLapTop.co.za  Fact Sheet for Healthcare Providers: http://kim-miller.com/  This test is not yet approved or                           cleared by the [place] FDA and has been authorized for detection and/or diagnosis of SARS-CoV-2 by FDA under an Emergency Use Authorization (EUA).  This EUA will remain in effect (meaning this test can be used) for the duration of the COVID-19 declaration under Section 564(b)(1) of  the Act, 21 U.S.C. section 360bbb-3(b)(1), unless the authorization is terminated or revoked sooner.  Performed at [facility], [address] [place], [place] [address]   Admission on 08/08/2023, Discharged on 08/08/2023  Component Date Value Ref Range Status   Sodium 08/08/2023 137  135 - 145 mmol/L Final   Potassium 08/08/2023 3.9  3.5 - 5.1 mmol/L Final   Chloride 08/08/2023 107  98 - 111 mmol/L Final   CO2 08/08/2023 20 (L)  22 - 32 mmol/L Final   Glucose, Bld 08/08/2023 148 (H)  70 - 99 mg/dL Final   Glucose reference range applies only to samples taken after fasting for at least 8 hours.   BUN 08/08/2023 9  8 - 23 mg/dL Final   Creatinine, Ser 08/08/2023 1.43 (H)  0.61 - 1.24 mg/dL Final   Calcium 60/45/4098 8.5 (L)  8.9 - 10.3 mg/dL Final   Total Protein 11/91/4782 7.1  6.5 - 8.1 g/dL Final   Albumin 95/62/1308 3.4 (L)  3.5 - 5.0 g/dL Final   AST 65/78/4696 25  15 - 41 U/L Final   ALT 08/08/2023 18  0 - 44 U/L Final   Alkaline Phosphatase 08/08/2023 76  38 - 126 U/L Final   Total Bilirubin 08/08/2023 0.7  0.3 - 1.2 mg/dL Final   GFR, Estimated 08/08/2023 53 (L)  >60 mL/min Final   Comment: (NOTE) Calculated using the CKD-EPI Creatinine Equation (2021)    Anion gap 08/08/2023 10  5 - 15 Final   Performed at [facility], [address] [place], [place] [address]   Alcohol, Ethyl (B) 08/08/2023 <10  <10 mg/dL Final   Comment: (NOTE) Lowest detectable limit for serum alcohol is 10 mg/dL.  For medical purposes only. Performed at [facility], [address] [place], [place] [address]    Salicylate Lvl 08/08/2023 <7.0 (L)  7.0 - 30.0 mg/dL Final   Performed at [facility], [address], [place], [place] [address]   Acetaminophen (Tylenol), Serum 08/08/2023 <10 (L)  10 - 30 ug/mL Final   Comment: (NOTE) Therapeutic concentrations vary significantly. A range of 10-30 ug/mL  may be an effective concentration for many patients. However,  some  are best treated at concentrations outside of this range. Acetaminophen concentrations >150 ug/mL at 4 hours after ingestion  and >50 ug/mL at 12 hours after ingestion are often associated with  toxic reactions.  Performed at [facility], [address] [place], [place] [address]    WBC 08/08/2023 9.9  4.0 - 10.5  K/uL Final   RBC 08/08/2023 4.58  4.22 - 5.81 MIL/uL Final   Hemoglobin 08/08/2023 14.0  13.0 - 17.0 g/dL Final   HCT 40/98/1191 44.0  39.0 - 52.0 % Final   MCV 08/08/2023 96.1  80.0 - 100.0 fL Final   MCH 08/08/2023 30.6  26.0 - 34.0 pg Final   MCHC 08/08/2023 31.8  30.0 - 36.0 g/dL Final   RDW 47/82/9562 12.6  11.5 - 15.5 % Final   Platelets 08/08/2023 249  150 - 400 K/uL Final   nRBC 08/08/2023 0.0  0.0 - 0.2 % Final   Performed at [facility], [address], [place], [place] [address]   Opiates 08/08/2023 NONE DETECTED  NONE DETECTED Final   Cocaine 08/08/2023 POSITIVE (A)  NONE DETECTED Final   Benzodiazepines 08/08/2023 NONE DETECTED  NONE DETECTED Final   Amphetamines 08/08/2023 NONE DETECTED  NONE DETECTED Final   Tetrahydrocannabinol 08/08/2023 NONE DETECTED  NONE DETECTED Final   Barbiturates 08/08/2023 NONE DETECTED  NONE DETECTED Final   Comment: (NOTE) DRUG SCREEN FOR MEDICAL PURPOSES ONLY.  IF CONFIRMATION IS NEEDED FOR ANY PURPOSE, NOTIFY LAB WITHIN 5 DAYS.  LOWEST DETECTABLE LIMITS FOR URINE DRUG SCREEN Drug Class                     Cutoff (ng/mL) Amphetamine and metabolites    1000 Barbiturate and metabolites    200 Benzodiazepine                 200 Opiates and metabolites        300 Cocaine and metabolites        300 THC                            50 Performed at [facility], [address] [place], [place] [address]    SARS Coronavirus 2 by RT PCR 08/08/2023 NEGATIVE  NEGATIVE Final   Performed at [facility], [address] [place], [place] [address]  Admission on 07/15/2023, Discharged on 07/17/2023   Component Date Value Ref Range Status   Sodium 07/15/2023 135  135 - 145 mmol/L Final   Potassium 07/15/2023 3.7  3.5 - 5.1 mmol/L Final   Chloride 07/15/2023 107  98 - 111 mmol/L Final   CO2 07/15/2023 19 (L)  22 - 32 mmol/L Final   Glucose, Bld 07/15/2023 129 (H)  70 - 99 mg/dL Final   Glucose reference range applies only to samples taken after fasting for at least 8 hours.   BUN 07/15/2023 11  8 - 23 mg/dL Final   Creatinine, Ser 07/15/2023 1.39 (H)  0.61 - 1.24 mg/dL Final   Calcium 28/41/3244 8.4 (L)  8.9 - 10.3 mg/dL Final   Total Protein 11/03/7251 6.9  6.5 - 8.1 g/dL Final   Albumin 66/44/0347 3.6  3.5 - 5.0 g/dL Final   AST 42/59/5638 21  15 - 41 U/L Final   ALT 07/15/2023 17  0 - 44 U/L Final   Alkaline Phosphatase 07/15/2023 64  38 - 126 U/L Final   Total Bilirubin 07/15/2023 0.5  0.3 - 1.2 mg/dL Final   GFR, Estimated 07/15/2023 55 (L)  >60 mL/min Final   Comment: (NOTE) Calculated using the CKD-EPI Creatinine Equation (2021)    Anion gap 07/15/2023 9  5 - 15 Final   Performed at [facility], [address] [place], [place] [address]  Alcohol, Ethyl (B) 07/15/2023 <10  <10 mg/dL Final   Comment: (NOTE) Lowest detectable limit for serum alcohol is 10 mg/dL.  For medical purposes only. Performed at [facility], [address] [place], [place] [address]    Salicylate Lvl 07/15/2023 <7.0 (L)  7.0 - 30.0 mg/dL Final   Performed at [facility], [address] [place], [place] [address]   Acetaminophen (Tylenol), Serum 07/15/2023 <10 (L)  10 - 30 ug/mL Final   Comment: (NOTE) Therapeutic concentrations vary significantly. A range of 10-30 ug/mL  may be an effective concentration for many patients. However, some  are best treated at concentrations outside of this range. Acetaminophen concentrations >150 ug/mL at 4 hours after ingestion  and >50 ug/mL at 12 hours after ingestion are often associated with  toxic reactions.  Performed at [facility], [address], [place], [place] [address]    WBC 07/15/2023 9.1  4.0 - 10.5 K/uL Final   RBC 07/15/2023 4.20 (L)  4.22 - 5.81 MIL/uL Final   Hemoglobin 07/15/2023 13.0  13.0 - 17.0 g/dL Final   HCT 69/62/9528 40.5  39.0 - 52.0 % Final   MCV 07/15/2023 96.4  80.0 - 100.0 fL Final   MCH 07/15/2023 31.0  26.0 - 34.0 pg Final   MCHC 07/15/2023 32.1  30.0 - 36.0 g/dL Final   RDW 41/32/4401 12.6  11.5 - 15.5 % Final   Platelets 07/15/2023 267  150 - 400 K/uL Final   nRBC 07/15/2023 0.0  0.0 - 0.2 % Final   Performed at [facility], [address], [place], [place] [address]   Opiates 07/15/2023 NONE DETECTED  NONE DETECTED Final   Cocaine 07/15/2023 POSITIVE (A)  NONE DETECTED Final   Benzodiazepines 07/15/2023 NONE DETECTED  NONE DETECTED Final   Amphetamines 07/15/2023 NONE DETECTED  NONE DETECTED Final   Tetrahydrocannabinol 07/15/2023 POSITIVE (A)  NONE DETECTED Final   Barbiturates 07/15/2023 NONE DETECTED  NONE DETECTED Final   Comment: (NOTE) DRUG SCREEN FOR MEDICAL PURPOSES ONLY.  IF CONFIRMATION IS NEEDED FOR ANY PURPOSE, NOTIFY LAB WITHIN 5 DAYS.  LOWEST DETECTABLE LIMITS FOR URINE DRUG SCREEN Drug Class                     Cutoff (ng/mL) Amphetamine and metabolites    1000 Barbiturate and metabolites    200 Benzodiazepine                 200 Opiates and metabolites        300 Cocaine and metabolites        300 THC                            50 Performed at [facility], [address] [place], [place] [address]    Color, Urine 07/15/2023 YELLOW  YELLOW Final   APPearance 07/15/2023 CLEAR  CLEAR Final   Specific Gravity, Urine 07/15/2023 >1.030 (H)  1.005 - 1.030 Final   pH 07/15/2023 6.0  5.0 - 8.0 Final   Glucose, UA 07/15/2023 NEGATIVE  NEGATIVE mg/dL Final   Hgb urine dipstick 07/15/2023 NEGATIVE  NEGATIVE Final   Bilirubin Urine 07/15/2023 NEGATIVE  NEGATIVE Final   Ketones, ur 07/15/2023 NEGATIVE  NEGATIVE mg/dL Final   Protein, ur  03/47/4259 NEGATIVE  NEGATIVE mg/dL Final   Nitrite 56/38/7564 NEGATIVE  NEGATIVE Final   Leukocytes,Ua 07/15/2023 NEGATIVE  NEGATIVE Final   Comment: Microscopic not done on urines with negative protein, blood, leukocytes, nitrite, or glucose < 500 mg/dL. Performed at [facility], [address], [place], [place] [address]    SARS Coronavirus 2 by RT PCR 07/16/2023 NEGATIVE  NEGATIVE Final   Performed at [facility], [address], [place], [place] [address]  Admission on 07/12/2023, Discharged on 07/12/2023  Component Date Value Ref Range Status   Troponin I (High Sensitivity) 07/12/2023 7  <18 ng/L Final   Comment: (NOTE) Elevated high sensitivity troponin I (hsTnI) values and significant  changes across serial measurements may suggest ACS but many other  chronic and acute conditions are known to elevate hsTnI results.  Refer to the "Links" section for chest pain algorithms and additional  guidance. Performed at [facility], [address] [place], [place] [address]    Sodium 07/12/2023 136  135 - 145 mmol/L Final   Potassium 07/12/2023 4.1  3.5 - 5.1 mmol/L Final   Chloride 07/12/2023 106  98 - 111 mmol/L Final   CO2 07/12/2023 19 (L)  22 - 32 mmol/L Final   Glucose, Bld 07/12/2023 117 (H)  70 - 99 mg/dL Final   Glucose reference range applies only to samples taken after fasting for at least 8 hours.   BUN 07/12/2023 10  8 - 23 mg/dL Final   Creatinine, Ser 07/12/2023 1.17  0.61 - 1.24 mg/dL Final   Calcium 91/47/8295 8.2 (L)  8.9 - 10.3 mg/dL Final   Total Protein 62/13/0865 6.6  6.5 - 8.1 g/dL Final   Albumin 78/46/9629 3.3 (L)  3.5 - 5.0 g/dL Final   AST 52/84/1324 22  15 - 41 U/L Final   ALT 07/12/2023 16  0 - 44 U/L Final   Alkaline Phosphatase 07/12/2023 62  38 - 126 U/L Final   Total Bilirubin 07/12/2023 0.6  0.3 - 1.2 mg/dL Final   GFR, Estimated 07/12/2023 >60  >60 mL/min Final   Comment: (NOTE) Calculated using the CKD-EPI Creatinine Equation  (2021)    Anion gap 07/12/2023 11  5 - 15 Final   Performed at [facility], [address] [place], [place] [address]   Alcohol, Ethyl (B) 07/12/2023 <10  <10 mg/dL Final   Comment: (NOTE) Lowest detectable limit for serum alcohol is 10 mg/dL.  For medical purposes only. Performed at [facility], [address] [place], [place] [address]    Salicylate Lvl 07/12/2023 <7.0 (L)  7.0 - 30.0 mg/dL Final   Performed at [facility], [address] [place], [place] [address]   Acetaminophen (Tylenol), Serum 07/12/2023 <10 (L)  10 - 30 ug/mL Final   Comment: (NOTE) Therapeutic concentrations vary significantly. A range of 10-30 ug/mL  may be an effective concentration for many patients. However, some  are best treated at concentrations outside of this range. Acetaminophen concentrations >150 ug/mL at 4 hours after ingestion  and >50 ug/mL at 12 hours after ingestion are often associated with  toxic reactions.  Performed at [facility], [address], [place], [place] [address]    WBC 07/12/2023 7.9  4.0 - 10.5 K/uL Final   RBC 07/12/2023 4.43  4.22 - 5.81 MIL/uL Final   Hemoglobin 07/12/2023 13.3  13.0 - 17.0 g/dL Final   HCT 95/63/8756 41.7  39.0 - 52.0 % Final   MCV 07/12/2023 94.1  80.0 - 100.0 fL Final   MCH 07/12/2023 30.0  26.0 - 34.0 pg Final   MCHC 07/12/2023 31.9  30.0 - 36.0 g/dL Final   RDW 16/08/9603 12.5  11.5 - 15.5 % Final   Platelets 07/12/2023 255  150 - 400 K/uL Final   nRBC 07/12/2023 0.0  0.0 - 0.2 % Final   Performed at [facility], [address] [place], [place] [address]   Opiates 07/12/2023 NONE DETECTED  NONE DETECTED Final   Cocaine 07/12/2023 POSITIVE (A)  NONE DETECTED Final   Benzodiazepines 07/12/2023 NONE DETECTED  NONE DETECTED Final   Amphetamines 07/12/2023 NONE DETECTED  NONE DETECTED Final   Tetrahydrocannabinol 07/12/2023 POSITIVE (A)  NONE DETECTED Final   Barbiturates 07/12/2023 NONE DETECTED  NONE  DETECTED Final   Comment: (NOTE) DRUG SCREEN FOR MEDICAL PURPOSES ONLY.  IF CONFIRMATION IS NEEDED FOR ANY PURPOSE, NOTIFY LAB WITHIN 5 DAYS.  LOWEST DETECTABLE LIMITS FOR URINE DRUG SCREEN Drug Class                     Cutoff (ng/mL) Amphetamine and metabolites    1000 Barbiturate and metabolites    200 Benzodiazepine                 200 Opiates and metabolites        300 Cocaine and metabolites        300 THC                            50 Performed at [facility], [address], [place], [place] [address]    Troponin I (High Sensitivity) 07/12/2023 7  <18 ng/L Final   Comment: (NOTE) Elevated high sensitivity troponin I (hsTnI) values and significant  changes across serial measurements may suggest ACS but many other  chronic and acute conditions are known to elevate hsTnI results.  Refer to the "Links" section for chest pain algorithms and additional  guidance. Performed at [facility], [address] [place], [place] [address]   Admission on 06/01/2023, Discharged on 06/01/2023  Component Date Value Ref Range Status   Fecal Occult Bld 06/01/2023 POSITIVE (A)  NEGATIVE Final   Sodium 06/01/2023 135  135 - 145 mmol/L Final   Potassium 06/01/2023 4.0  3.5 - 5.1 mmol/L Final   Chloride 06/01/2023 102  98 - 111 mmol/L Final   CO2 06/01/2023 22  22 - 32 mmol/L Final   Glucose, Bld 06/01/2023 100 (H)  70 - 99 mg/dL Final   Glucose reference range applies only to samples taken after fasting for at least 8 hours.   BUN 06/01/2023 13  8 - 23 mg/dL Final   Creatinine, Ser 06/01/2023 1.35 (H)  0.61 - 1.24 mg/dL Final   Calcium 62/13/0865 8.6 (L)  8.9 - 10.3 mg/dL Final   Total Protein 78/46/9629 6.8  6.5 - 8.1 g/dL Final   Albumin 52/84/1324 3.5  3.5 - 5.0 g/dL Final   AST 40/08/2724 26  15 - 41 U/L Final   ALT 06/01/2023 18  0 - 44 U/L Final   Alkaline Phosphatase 06/01/2023 65  38 - 126 U/L Final   Total Bilirubin 06/01/2023 0.8  0.3 - 1.2 mg/dL Final   GFR,  Estimated 06/01/2023 57 (L)  >60 mL/min Final   Comment: (NOTE) Calculated using the CKD-EPI Creatinine Equation (2021)    Anion gap 06/01/2023 11  5 - 15 Final   Performed at [facility], [address] [place], [place] [address]   WBC 06/01/2023 11.7 (H)  4.0 - 10.5 K/uL Final   RBC 06/01/2023 3.94 (L)  4.22 - 5.81 MIL/uL Final   Hemoglobin 06/01/2023 12.2 (L)  13.0 - 17.0 g/dL Final   HCT 59/56/3875 37.8 (L)  39.0 - 52.0 % Final   MCV 06/01/2023 95.9  80.0 - 100.0 fL Final   MCH 06/01/2023 31.0  26.0 - 34.0 pg Final   MCHC 06/01/2023 32.3  30.0 - 36.0 g/dL Final   RDW 64/33/2951 12.4  11.5 - 15.5 % Final   Platelets 06/01/2023 238  150 - 400 K/uL Final   nRBC 06/01/2023 0.0  0.0 - 0.2 % Final   Neutrophils Relative % 06/01/2023 75  % Final   Neutro Abs 06/01/2023 8.7 (H)  1.7 - 7.7 K/uL Final   Lymphocytes Relative 06/01/2023 16  % Final   Lymphs Abs 06/01/2023 1.9  0.7 - 4.0 K/uL Final   Monocytes Relative 06/01/2023 7  % Final   Monocytes Absolute 06/01/2023 0.9  0.1 - 1.0 K/uL Final   Eosinophils Relative 06/01/2023 0  % Final   Eosinophils Absolute 06/01/2023 0.0  0.0 - 0.5 K/uL Final   Basophils Relative 06/01/2023 1  % Final   Basophils Absolute 06/01/2023 0.1  0.0 - 0.1 K/uL Final   Immature Granulocytes 06/01/2023 1  % Final   Abs Immature Granulocytes 06/01/2023 0.10 (H)  0.00 - 0.07 K/uL Final   Performed at [facility], [address], [place], [place] [address]   Prothrombin Time 06/01/2023 13.7  11.4 - 15.2 seconds Final   INR 06/01/2023 1.0  0.8 - 1.2 Final   Comment: (NOTE) INR goal varies based on device and disease states. Performed at [facility], [address] [place], [place] [address]    ABO/RH(D) 06/01/2023 O POS   Final   Antibody Screen 06/01/2023 NEG   Final   Sample Expiration 06/01/2023    Final                   Value:06/04/2023,2359 Performed at [facility], [address], [place], [place] [address]    ABO/RH(D)  06/01/2023    Final                   Value:O POS Performed at [facility], [address], [place], [place] [address]   Admission on 05/24/2023, Discharged on 05/25/2023  Component Date Value Ref Range Status   Sodium 05/24/2023 139  135 - 145 mmol/L Final   Potassium 05/24/2023 3.5  3.5 - 5.1 mmol/L Final   Chloride 05/24/2023 105  98 - 111 mmol/L Final   CO2 05/24/2023 21 (L)  22 - 32 mmol/L Final   Glucose, Bld 05/24/2023 117 (H)  70 - 99 mg/dL Final   Glucose reference range applies only to samples taken after fasting for at least 8 hours.   BUN 05/24/2023 7 (L)  8 - 23 mg/dL Final   Creatinine, Ser 05/24/2023 1.10  0.61 - 1.24 mg/dL Final   Calcium 57/32/2025 8.7 (L)  8.9 - 10.3 mg/dL Final   Total Protein 42/70/6237 6.9  6.5 - 8.1 g/dL Final   Albumin 62/83/1517 3.6  3.5 - 5.0 g/dL Final   AST 61/60/7371 19  15 - 41 U/L Final   ALT 05/24/2023 14  0 - 44 U/L Final   Alkaline Phosphatase 05/24/2023 62  38 - 126 U/L Final  Total Bilirubin 05/24/2023 0.7  0.3 - 1.2 mg/dL Final   GFR, Estimated 05/24/2023 >60  >60 mL/min Final   Comment: (NOTE) Calculated using the CKD-EPI Creatinine Equation (2021)    Anion gap 05/24/2023 13  5 - 15 Final   Performed at [facility], [address] [place], [place] [address]   Alcohol, Ethyl (B) 05/24/2023 18 (H)  <10 mg/dL Final   Comment: (NOTE) Lowest detectable limit for serum alcohol is 10 mg/dL.  For medical purposes only. Performed at [facility], [address], [place], [place] [address]    Salicylate Lvl 05/24/2023 <7.0 (L)  7.0 - 30.0 mg/dL Final   Performed at [facility], [address] [place], [place] [address]   Acetaminophen (Tylenol), Serum 05/24/2023 <10 (L)  10 - 30 ug/mL Final   Comment: (NOTE) Therapeutic concentrations vary significantly. A range of 10-30 ug/mL  may be an effective concentration for many patients. However, some  are best treated at concentrations outside of this  range. Acetaminophen concentrations >150 ug/mL at 4 hours after ingestion  and >50 ug/mL at 12 hours after ingestion are often associated with  toxic reactions.  Performed at [facility], [address], [place], [place] [address]    WBC 05/24/2023 7.7  4.0 - 10.5 K/uL Final   RBC 05/24/2023 4.01 (L)  4.22 - 5.81 MIL/uL Final   Hemoglobin 05/24/2023 12.5 (L)  13.0 - 17.0 g/dL Final   HCT 64/40/3474 38.3 (L)  39.0 - 52.0 % Final   MCV 05/24/2023 95.5  80.0 - 100.0 fL Final   MCH 05/24/2023 31.2  26.0 - 34.0 pg Final   MCHC 05/24/2023 32.6  30.0 - 36.0 g/dL Final   RDW 25/95/6387 12.7  11.5 - 15.5 % Final   Platelets 05/24/2023 255  150 - 400 K/uL Final   nRBC 05/24/2023 0.0  0.0 - 0.2 % Final   Performed at [facility], [address] [place], [place] [address]   Opiates 05/24/2023 NONE DETECTED  NONE DETECTED Final   Cocaine 05/24/2023 POSITIVE (A)  NONE DETECTED Final   Benzodiazepines 05/24/2023 NONE DETECTED  NONE DETECTED Final   Amphetamines 05/24/2023 NONE DETECTED  NONE DETECTED Final   Tetrahydrocannabinol 05/24/2023 NONE DETECTED  NONE DETECTED Final   Barbiturates 05/24/2023 NONE DETECTED  NONE DETECTED Final   Comment: (NOTE) DRUG SCREEN FOR MEDICAL PURPOSES ONLY.  IF CONFIRMATION IS NEEDED FOR ANY PURPOSE, NOTIFY LAB WITHIN 5 DAYS.  LOWEST DETECTABLE LIMITS FOR URINE DRUG SCREEN Drug Class                     Cutoff (ng/mL) Amphetamine and metabolites    1000 Barbiturate and metabolites    200 Benzodiazepine                 200 Opiates and metabolites        300 Cocaine and metabolites        300 THC                            50 Performed at [facility], [address] [place], [place] [address]    Troponin I (High Sensitivity) 05/24/2023 14  <18 ng/L Final   Comment: (NOTE) Elevated high sensitivity troponin I (hsTnI) values and significant  changes across serial measurements may suggest ACS but many other  chronic and acute  conditions are known to elevate hsTnI results.  Refer to the "Links" section for chest pain algorithms and additional  guidance. Performed at [facility], [address] [place], [place] [address]    Troponin I (High Sensitivity) 05/24/2023 13  <18 ng/L Final   Comment: (NOTE) Elevated high sensitivity troponin I (hsTnI) values and significant  changes across serial measurements may suggest ACS but many other  chronic and acute conditions are known to elevate hsTnI results.  Refer to the "Links" section for chest pain algorithms and additional  guidance. Performed at [facility], [address], [place], [place] [address]   Admission on 04/05/2023, Discharged on 04/06/2023  Component Date Value Ref Range Status   Acetaminophen (Tylenol), Serum 04/05/2023 <10 (L)  10 - 30 ug/mL Final   Comment: (NOTE) Therapeutic concentrations vary significantly. A range of 10-30 ug/mL  may be an effective concentration for many patients. However, some  are best treated at concentrations outside of this range. Acetaminophen concentrations >150 ug/mL at 4 hours after ingestion  and >50 ug/mL at 12 hours after ingestion are often associated with  toxic reactions.  Performed at [facility], [address], [place], [place] [address]    WBC 04/05/2023 11.2 (H)  4.0 - 10.5 K/uL Final   RBC 04/05/2023 4.39  4.22 - 5.81 MIL/uL Final   Hemoglobin 04/05/2023 13.3  13.0 - 17.0 g/dL Final   HCT 91/47/8295 42.6  39.0 - 52.0 % Final   MCV 04/05/2023 97.0  80.0 - 100.0 fL Final   MCH 04/05/2023 30.3  26.0 - 34.0 pg Final   MCHC 04/05/2023 31.2  30.0 - 36.0 g/dL Final   RDW 62/13/0865 13.0  11.5 - 15.5 % Final   Platelets 04/05/2023 276  150 - 400 K/uL Final   nRBC 04/05/2023 0.0  0.0 - 0.2 % Final   Neutrophils Relative % 04/05/2023 61  % Final   Neutro Abs 04/05/2023 6.9  1.7 - 7.7 K/uL Final   Lymphocytes Relative 04/05/2023 26  % Final   Lymphs Abs 04/05/2023 2.9  0.7 - 4.0 K/uL  Final   Monocytes Relative 04/05/2023 9  % Final   Monocytes Absolute 04/05/2023 1.0  0.1 - 1.0 K/uL Final   Eosinophils Relative 04/05/2023 3  % Final   Eosinophils Absolute 04/05/2023 0.3  0.0 - 0.5 K/uL Final   Basophils Relative 04/05/2023 1  % Final   Basophils Absolute 04/05/2023 0.1  0.0 - 0.1 K/uL Final   Immature Granulocytes 04/05/2023 0  % Final   Abs Immature Granulocytes 04/05/2023 0.03  0.00 - 0.07 K/uL Final   Performed at [facility], [address], [place], [place] [address]   Sodium 04/05/2023 140  135 - 145 mmol/L Final   Potassium 04/05/2023 4.1  3.5 - 5.1 mmol/L Final   Chloride 04/05/2023 106  98 - 111 mmol/L Final   CO2 04/05/2023 25  22 - 32 mmol/L Final   Glucose, Bld 04/05/2023 104 (H)  70 - 99 mg/dL Final   Glucose reference range applies only to samples taken after fasting for at least 8 hours.   BUN 04/05/2023 15  8 - 23 mg/dL Final   Creatinine, Ser 04/05/2023 1.59 (H)  0.61 - 1.24 mg/dL Final   Calcium 62/95/2841 9.0  8.9 - 10.3 mg/dL Final   Total Protein 32/44/0102 7.2  6.5 - 8.1 g/dL Final   Albumin 72/53/6644 3.5  3.5 - 5.0 g/dL Final  AST 04/05/2023 22  15 - 41 U/L Final   ALT 04/05/2023 14  0 - 44 U/L Final   Alkaline Phosphatase 04/05/2023 83  38 - 126 U/L Final   Total Bilirubin 04/05/2023 0.6  0.3 - 1.2 mg/dL Final   GFR, Estimated 04/05/2023 47 (L)  >60 mL/min Final   Comment: (NOTE) Calculated using the CKD-EPI Creatinine Equation (2021)    Anion gap 04/05/2023 9  5 - 15 Final   Performed at [facility] Lab, [address], [place], [place] [address]   Alcohol, Ethyl (B) 04/05/2023 <10  <10 mg/dL Final   Comment: (NOTE) Lowest detectable limit for serum alcohol is 10 mg/dL.  For medical purposes only. Performed at [facility], [address] [place], [place] [address]    Opiates 04/05/2023 NONE DETECTED  NONE DETECTED Final   Cocaine 04/05/2023 POSITIVE (A)  NONE DETECTED Final   Benzodiazepines 04/05/2023 NONE  DETECTED  NONE DETECTED Final   Amphetamines 04/05/2023 NONE DETECTED  NONE DETECTED Final   Tetrahydrocannabinol 04/05/2023 NONE DETECTED  NONE DETECTED Final   Barbiturates 04/05/2023 NONE DETECTED  NONE DETECTED Final   Comment: (NOTE) DRUG SCREEN FOR MEDICAL PURPOSES ONLY.  IF CONFIRMATION IS NEEDED FOR ANY PURPOSE, NOTIFY LAB WITHIN 5 DAYS.  LOWEST DETECTABLE LIMITS FOR URINE DRUG SCREEN Drug Class                     Cutoff (ng/mL) Amphetamine and metabolites    1000 Barbiturate and metabolites    200 Benzodiazepine                 200 Opiates and metabolites        300 Cocaine and metabolites        300 THC                            50 Performed at [facility] Lab, [address], [place], [place] [address]    Salicylate Lvl 04/05/2023 <7.0 (L)  7.0 - 30.0 mg/dL Final   Performed at [facility], [address], [place], [place] [address]   Troponin I (High Sensitivity) 04/05/2023 12  <18 ng/L Final   Comment: (NOTE) Elevated high sensitivity troponin I (hsTnI) values and significant  changes across serial measurements may suggest ACS but many other  chronic and acute conditions are known to elevate hsTnI results.  Refer to the "Links" section for chest pain algorithms and additional  guidance. Performed at [facility], [address] [place], [place] [address]    Troponin I (High Sensitivity) 04/05/2023 12  <18 ng/L Final   Comment: (NOTE) Elevated high sensitivity troponin I (hsTnI) values and significant  changes across serial measurements may suggest ACS but many other  chronic and acute conditions are known to elevate hsTnI results.  Refer to the "Links" section for chest pain algorithms and additional  guidance. Performed at [facility], [address] [place], [place] [address]    SARS Coronavirus 2 by RT PCR 04/05/2023 NEGATIVE  NEGATIVE Final   Performed at [facility], [address] [place], [place] [address]   Congregational Nurse Program on 03/22/2023  Component Date Value Ref Range Status   POC Glucose 03/22/2023 140 (A)  70 - 99 mg/dl Final   Blood Alcohol level:  Lab Results  Component Value Date   ETH <10 09/15/2023  ETH <10 08/08/2023   Metabolic Disorder Labs: Lab Results  Component Value Date   HGBA1C 5.1 09/15/2023   MPG 99.67 09/15/2023   MPG 103 01/13/2023   No results found for: "PROLACTIN" Lab Results  Component Value Date   CHOL 159 09/15/2023   TRIG 53 09/15/2023   HDL 61 09/15/2023   CHOLHDL 2.6 09/15/2023   VLDL 11 09/15/2023   LDLCALC 87 09/15/2023   LDLCALC 99 01/13/2023   Therapeutic Lab Levels: No results found for: "LITHIUM" No results found for: "VALPROATE" No results found for: "CBMZ" Physical Findings   AIMS    Flowsheet Row [facility]) [facility] Ad[facility] ([facility]) [facility]  AIMS Total Score 0 0      AUDIT    Flowsheet Row Admission (Discharged) from 08/08/2023 in [facility] Admission (Discharged) from 04/06/2023 in [facility] BE[facility] Admission (Discharged) from 01/10/2023 in [facility] Admission (Discharged) from 08/11/2022 in [facility] Admission (Discharged) [facility]  Alcohol Use Disorder Identification Test Final Score (AUDIT) 29 0 13 0 7      CAGE-AID    Flowsheet Row ED to Hosp-Admission (Discharged) from 06/04/2020 in [facility]  CAGE-AID Score 3      GAD-7    Flowsheet Row Office Visit from 01/11/2022 in [facility] [place] - A Dept Of [place]. [facility] Office Visit from 09/28/2021 in [facility] [place] - A Dept Of Eligha Bridegroom. [facility] Office Visit from 02/26/2021 in [facility] Office Visit from  12/25/2020 in [facility] Integrated [facility]al [facility] from 07/16/2020 in [facility] [place] - A Dept Of [place]. [facility]  Total GAD-7 Score 7 10 11 7 14       PHQ2-9    Flowsheet Row ED from 09/16/2023 in [facility] Clinical Support from 10/19/2022 in [facility] - A Dept Of [place]. [place] [facility] Office Visit from 05/11/2022 in [facility] [place] [facility] Dept Of Eligha Bridegroom. [facility] Patient Outreach Telephone from 03/31/2022 in [facility] Coordination Patient Outreach Telephone from 01/14/2022 in [facility] Community Care Coordination  PHQ-2 Total Score 1 0 2 0 1  PHQ-9 Total Score -- -- 5 -- --      Flowsheet Row ED from 09/16/2023 in [facility] ED from 09/15/2023 in [facility] Admission (Discharged) from 08/08/2023 in [facility] BEHAVIORAL MEDICINE  C-SSRS RISK CATEGORY No Risk No Risk High Risk       Musculoskeletal  Strength & Muscle Tone: within normal limits Gait & Station: normal Patient leans: N/A   Strength & Muscle Tone: within normal limits Gait & Station: unsteady, mildly unsteady Patient leans: Front  Psychiatric Specialty Exam   Presentation  General Appearance:Appropriate for Environment, Casual Eye Contact:Fair Speech:Clear and Coherent, Normal Rate Volume:Decreased Handedness:Right  Mood and Affect  Mood:Euthymic Affect:Appropriate, Congruent  Thought Process  Thought Process:Coherent, Goal Directed Descriptions of Associations:Intact  Thought Content Suicidal Thoughts:Suicidal Thoughts: No Homicidal Thoughts:Homicidal Thoughts: No Hallucinations:Hallucinations: None Ideas of Reference:None Thought Content:Logical, WDL  Sensorium  Memory:Immediate Good, Recent Good Judgment:Fair Insight:Fair  Executive Functions   Orientation:Full (Time, Place and Person) Language:Fair Concentration:Fair Attention:Good Recall:Fair Fund of  Knowledge:Fair  Psychomotor Activity  Psychomotor Activity:Psychomotor Activity: Normal  Assets  Assets:Communication Skills, Desire for Improvement, Social Support, Housing  Sleep  Quality:Good  Physical Exam  BP (!) 158/90   Pulse 74   Temp 98.1 F (36.7 C) (Oral)   Resp 20   SpO2 99%  Physical Exam Vitals reviewed.  Constitutional:      General: He is not in acute distress.    Appearance: He is not toxic-appearing.  HENT:     Head: Normocephalic and atraumatic.  Pulmonary:     Effort: Pulmonary effort is normal.  Skin:    General: Skin is warm and dry.  Neurological:     Mental Status: He is alert.     Gait: Gait abnormal.      Assessment / Plan  Total Time spent with patient: 30 minutes Treatment Plan Summary: Daily contact with patient to assess and evaluate symptoms and progress in treatment and Medication management  Principal Problem:   Alcohol use with alcohol-induced mood disorder [facility])   Mr. Acesyn Maia is a 67 year old male with a past psychiatric history history of multiple inpatient hospitalizations (x3 2024, most recent in October), major depressive disorder, cocaine use disorder, alcohol use disorder and past medical history of COPD, hypertension, NSTEMI, CAD who presented to the behavioral health urgent care unaccompanied requesting detox from alcohol and cocaine. Patient continues to deny depressive symptoms. Goal is to help him get through this detox period. While inpatient rehab is recommended for him, he requests intensive outpatient services.  He plans to pursue residential substance use treatment after the holidays.   Labs notable for Cr 1.17, AST 19, ALT 15. Lipid panel wnl. Alc <10. UDS+cocaine. QTc 437.    #Alcohol use disorder #Cocaine use disorder  -Previously discontinued ativan taper -Continue CIWA monitoring: 0 over  the past 24 hours; if remains less than 4, we will discontinue tomorrow. -PRN ativan for CIWA >10  -PRN: atarax, tylenol, maalox, imodium, milk of mg, zofran -daily MV, thiamine  -recommend abstinence from cocaine    #History of MDD #Alcohol-Induced Mood Disorder Has been noncompliant with medications -restart home abilify 5    #HTN -Continue cardizem 180 daily   #HLD -Continue lipitor 80   #COPD -Continue dulera, PRN albuterol    #Tobacco use disorder -declines NRT   Dispo: Patient set up with [facility] appointment on Wednesday 1pm   Signed: Lamar Sprinkles, MD Psychiatry Resident, PGY-3 09/17/2023, 11:08 AM   [facility] [address] [place], [place] [address] Dept: [phone] Dept Fax: [phone]

## 2023-09-18 DIAGNOSIS — F1014 Alcohol abuse with alcohol-induced mood disorder: Secondary | ICD-10-CM | POA: Diagnosis not present

## 2023-09-18 DIAGNOSIS — F1094 Alcohol use, unspecified with alcohol-induced mood disorder: Secondary | ICD-10-CM | POA: Diagnosis not present

## 2023-09-18 NOTE — Group Note (Signed)
Group Topic: Healthy Self Image and Positive Change  Group Date: 09/18/2023 Start Time: 1730 End Time: 1800 Facilitators: Olin Hauser, RN  Department: [facility]  Number of Participants: 5  Group Focus: acceptance, affirmation, clarity of thought, coping skills, healthy friendships, and relapse prevention Treatment Modality:  Behavior Modification Therapy and Cognitive Behavioral Therapy Interventions utilized were confrontation and exploration Purpose: enhance coping skills, explore maladaptive thinking, express feelings, increase insight, and regain self-worth  Name: Robert Chan Date of Birth: 11-14-1955  MR: [id number]    Level of Participation: active Quality of Participation: attentive and cooperative Interactions with others: gave feedback Mood/Affect: appropriate Triggers (if applicable): None Cognition: coherent/clear, insightful, and logical Progress: Gaining insight Response: Patient was actively engaging Plan: patient will be encouraged to think positively  Patients Problems:  Patient Active Problem List   Diagnosis Date Noted   Alcohol use with alcohol-induced mood disorder (HCC) 09/16/2023   MDD (major depressive disorder), recurrent severe, without psychosis (HCC) 08/08/2023   MDD (major depressive disorder), recurrent episode, mild (HCC) 07/12/2023   Cocaine use 05/25/2023   PAD (peripheral artery disease) (HCC) 12/01/2021   Atopic dermatitis 04/22/2021   Former tobacco use    Polysubstance abuse (HCC) 09/06/2020   Vitamin D deficiency 06/23/2020   Coronary artery disease 06/19/2020   History of non-ST elevation myocardial infarction (NSTEMI)    Foot callus 12/12/2019   Age-related nuclear cataract of right eye 09/13/2016   Benign hypertension 07/06/2016   Glaucoma 07/06/2016   Male erectile disorder 07/06/2016   Asthma 10/18/2012   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

## 2023-09-18 NOTE — ED Notes (Signed)
Patient observed resting quietly, eyes closed. Respirations equal and unlabored. Will continue to monitor for safety.  

## 2023-09-18 NOTE — ED Notes (Signed)
Patient alert & oriented X 4. He endorses passive SI, states he thinks about it all the time. When questioned about a plan, pt laughs and states "I'm alright" and refuses to answer. Patient is able to contract for safety. He denies HI or AVH. He is calm and cooperative. Patient interacts with peers appropriately. No additional needs noted at this time. Will continue to monitor for safety.

## 2023-09-18 NOTE — Group Note (Signed)
Group Topic: Communication  Group Date: 09/18/2023 Start Time: 2030 End Time: 2100 Facilitators: Rae Lips B  Department: [facility]  Number of Participants: 1  Group Focus: abuse issues, acceptance, activities of daily living skills, and chemical dependency education Treatment Modality:  Individual Therapy Interventions utilized were leisure development Purpose: express feelings  Name: Robert Chan Date of Birth: Dec 30, 1955  MR: [id number]    Level of Participation: PT DID NOT ATTEND GROUP Quality of Participation: cooperative Interactions with others: gave feedback Mood/Affect: appropriate Triggers (if applicable): NA Cognition: coherent/clear Progress: Gaining insight Response: NA Plan: patient will be encouraged to go to groups.   Patients Problems:  Patient Active Problem List   Diagnosis Date Noted   Alcohol use with alcohol-induced mood disorder (HCC) 09/16/2023   MDD (major depressive disorder), recurrent severe, without psychosis (HCC) 08/08/2023   MDD (major depressive disorder), recurrent episode, mild (HCC) 07/12/2023   Cocaine use 05/25/2023   PAD (peripheral artery disease) (HCC) 12/01/2021   Atopic dermatitis 04/22/2021   Former tobacco use    Polysubstance abuse (HCC) 09/06/2020   Vitamin D deficiency 06/23/2020   Coronary artery disease 06/19/2020   History of non-ST elevation myocardial infarction (NSTEMI)    Foot callus 12/12/2019   Age-related nuclear cataract of right eye 09/13/2016   Benign hypertension 07/06/2016   Glaucoma 07/06/2016   Male erectile disorder 07/06/2016   Asthma 10/18/2012   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

## 2023-09-18 NOTE — ED Notes (Signed)
Patient was provided breakfasr

## 2023-09-18 NOTE — Group Note (Signed)
Group Topic: Change and Accountability  Group Date: 09/18/2023 Start Time: 1420 End Time: 1500 Facilitators: Clodfelter-Simmons, Burnard Enis L, NT  Department: [facility]  Number of Participants: 3  Group Focus: relapse prevention Treatment Modality:  Solution-Focused Therapy Interventions utilized were problem solving Purpose: relapse prevention strategies  Name: Robert Chan Date of Birth: Nov 08, 1955  MR: [id number]    Level of Participation: minimal Quality of Participation: cooperative Interactions with others: gave feedback Mood/Affect: appropriate Triggers (if applicable): none Cognition: concrete Progress: Gaining insight Response: minimal Plan: patient will be encouraged to focus on self  Patients Problems:  Patient Active Problem List   Diagnosis Date Noted   Alcohol use with alcohol-induced mood disorder (HCC) 09/16/2023   MDD (major depressive disorder), recurrent severe, without psychosis (HCC) 08/08/2023   MDD (major depressive disorder), recurrent episode, mild (HCC) 07/12/2023   Cocaine use 05/25/2023   PAD (peripheral artery disease) (HCC) 12/01/2021   Atopic dermatitis 04/22/2021   Former tobacco use    Polysubstance abuse (HCC) 09/06/2020   Vitamin D deficiency 06/23/2020   Coronary artery disease 06/19/2020   History of non-ST elevation myocardial infarction (NSTEMI)    Foot callus 12/12/2019   Age-related nuclear cataract of right eye 09/13/2016   Benign hypertension 07/06/2016   Glaucoma 07/06/2016   Male erectile disorder 07/06/2016   Asthma 10/18/2012   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

## 2023-09-18 NOTE — ED Notes (Signed)
Patient resting quietly in bed with eyes closed, Respirations equal and unlabored, skin warm and dry, no apparent distress noted and no s/s of alcohol withdrawal noted. Routine safety checks conducted according to facility protocol. Will continue to monitor for safety.

## 2023-09-18 NOTE — ED Provider Notes (Signed)
[facility] Progress Note  Date & Time: 09/18/2023 8:52 AM Name: Robert Chan Age: 67 y.o.  DOB: 04/28/1956  MRN: [id number]  Diagnosis:  Final diagnoses:  Alcohol-induced mood disorder (HCC)  COPD with chronic bronchitis (HCC)  Moderate episode of recurrent major depressive disorder (HCC)  PAD (peripheral artery disease) (HCC)  Essential hypertension  Cocaine use disorder (HCC)  Tobacco use disorder    Reason for presentation: detox from alcohol and cocaine  Brief HPI  Robert Chan is a 67 y.o. male, with past psychiatric history history of multiple inpatient hospitalizations (x3 2024, most recent in October), major depressive disorder, cocaine use disorder, alcohol use disorder and past medical history of COPD, hypertension, NSTEMI, CAD, who presented Voluntary to [facility] (09/18/2023) then admitted to [facility] for detox from alcohol and cocaine. He is not interested in residential rehab.      Component Value Date/Time   ETH <10 09/15/2023 1652       Component Value Date/Time   LABOPIA NONE DETECTED 08/08/2023 1730   COCAINSCRNUR POSITIVE (A) 08/08/2023 1730   LABBENZ NONE DETECTED 08/08/2023 1730   AMPHETMU NONE DETECTED 08/08/2023 1730   THCU NONE DETECTED 08/08/2023 1730   LABBARB NONE DETECTED 08/08/2023 1730     Interval Hx   Patient Narrative:  On assessment today, patient reports that he is doing well overall.  He reports a good mood.  He slept well, and his appetite is intact.  He denies SI, HI, and AVH.  We spent time discussing his discharge plans advising that he has been accepted to [facility] on Wednesday.  He is informed that he will likely discharge that morning to attend programming, after which time he will be able to return home; he is in agreement with this plan.  He does maintain that after the holidays, he intends to seek residential substance use treatment at day [facility].  He requests his eyedrops be added to his  medication regimen, as they were unable to be obtained last night, but otherwise denies somatic nor other acute concerns or complaints today.  ROS   Past History   Psychiatric History:  Patient reports a history of depression. He reports a past psychiatric hospitalization in October 2020 for and a suicide attempt "a while ago."  Per chart review patient has had multiple inpatient hospitalizations for major depressive disorder with suicidal ideations, he has had 3 within the past year.  He has had medication trials of Abilify, doxepin, Remeron, Wellbutrin.  He has been noncompliant with medications after previous discharges and with outpatient follow-up.  PCP: Robert Frisk, MD   Psychiatric Family History:  Patient states that his 2 brothers died from alcohol and drug use.   Social History:   he lives by himself. He reports that he supports himself with disability check from [employer]. He reports that he lives in an apartment in [place]. He reports that he has been separated from his wife for a while. He reports that he has a daughter in [place] and 6 grandchildren. He reports that his family is in [place]   EtOH:  reports current alcohol use. Began in early teens and drinking approximately 2 to 3 ounce bottles of alcohol per day.  Tobacco:  reports that he has been smoking cigarettes. He has never used smokeless tobacco.  Cannabis: Denies Opiates: Denies Stimulants: Cocaine- did not specify quantity BZO/hypnotics: Denies Seizure/DT: Denies  Treatments: rehab in [place] 8 to 9 years ago  IVDU: Denies  Past Medical History:  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   Depression     Elevated serum creatinine 08/28/2019   Homelessness 06/19/2020   Homicidal ideations 05/25/2023   Hypertension    Major depressive disorder, recurrent episode, 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/11/2022   Major depressive disorder, single episode, 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/10/2022   MDD (major depressive disorder), recurrent severe, 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   NSTEMI (non-ST elevated myocardial infarction) (HCC)    Schizoaffective disorder (HCC) 11/08/2022   Status post incision and drainage 04/22/2021   Suicidal ideation 06/05/2020   Suicidal ideation 06/05/2020    Past Surgical History:  Procedure Laterality Date   APPENDECTOMY     EYE SURGERY     LEFT HEART CATH AND CORONARY ANGIOGRAPHY N/A 06/05/2020   Procedure: LEFT HEART CATH AND CORONARY ANGIOGRAPHY;  Surgeon: Elder Negus, MD;  Location: [facility];  Service: Cardiovascular;  Laterality: N/A;   PROSTATE SURGERY     Family History:  Family History  Problem Relation Age of Onset   Hypertension Mother    Diabetes Mother    Hypertension Father    Hypertension Sister    Social History   Substance and Sexual Activity  Alcohol Use Yes   Comment: 4 40oz beer a week    Social History   Substance and Sexual Activity  Drug Use Not Currently   Types: Cocaine   Comment: cocaine use once a month, last use 6 mos ago    Social History   Socioeconomic History   Marital status: Single    Spouse name: Not on file   Number of children: Not on file   Years of education: Not on file   Highest education level: Not on file  Occupational History   Not on file  Tobacco Use   Smoking status: Some Days    Current packs/day: 0.00    Types: Cigarettes    Last attempt to quit: 12/25/2020    Years since quitting: 2.7   Smokeless tobacco: Never   Tobacco comments:    Smokes a couple cigarettes every now and then  Vaping Use   Vaping status: Never Used  Substance and Sexual Activity   Alcohol use: Yes    Comment: 4 40oz beer a week    Drug use: Not Currently    Types: Cocaine    Comment: cocaine use once a month, last use 6 mos ago   Sexual activity: Not Currently  Other Topics Concern   Not on file  Social History Narrative   His mother passed at age 84 (when he was 40 years old) and his father raised him and his siblings   His parents were pastors as a lot of his sisters and other family members are today   There was a total of 13 children in his family Had 65 sisters, one sister deceased , Had 3 brothers, all brothers deceased   He is the youngest of the 80 and was "always in trouble in my younger years"   Family still live in [place], [place], some in [place], [place]   Has a Daughter and son with a total of 10 grandchildren (6  grand daughters local and 4 grandsons)    Values family   Married twice, present wife incarcerated   Social Determinants of Health   Financial Resource Strain: Low Risk  (10/19/2022)   Overall Financial Resource Strain (CARDIA)    Difficulty of Paying Living Expenses: Not hard at all  Food Insecurity: Food Insecurity Present (09/16/2023)   Hunger Vital Sign    Worried About Running Out of Food in the Last Year: Sometimes true    Ran Out of Food in the Last Year: Sometimes true  Transportation Needs: Unmet Transportation Needs (09/16/2023)   PRAPARE - Administrator, Civil Service (Medical): Yes    Lack of Transportation (Non-Medical): Yes  Physical Activity: Inactive (10/19/2022)   Exercise Vital Sign    Days of Exercise per Week: 0 days    Minutes of Exercise per Session: 0 min  Stress: No Stress Concern Present (10/19/2022)   [name] of Occupational Health - Occupational Stress Questionnaire    Feeling of Stress : Not at all  Social Connections: Moderately Integrated (03/31/2022)   Social Connection and Isolation Panel [NHANES]    Frequency of Communication with Friends and Family: Three times a week    Frequency of Social Gatherings with Friends and  Family: Three times a week    Attends Religious Services: 1 to 4 times per year    Active Member of Clubs or Organizations: Yes    Attends Banker Meetings: 1 to 4 times per year    Marital Status: Never married   SDOH: SDOH Screenings   Food Insecurity: Food Insecurity Present (09/16/2023)  Housing: Patient Declined (09/16/2023)  Transportation Needs: Unmet Transportation Needs (09/16/2023)  Utilities: Not At Risk (09/16/2023)  Alcohol Screen: Low Risk  (09/16/2023)  Recent Concern: Alcohol Screen - High Risk (08/08/2023)  Depression (PHQ2-9): Low Risk  (09/16/2023)  Financial Resource Strain: Low Risk  (10/19/2022)  Physical Activity: Inactive (10/19/2022)  Social Connections: Moderately Integrated (03/31/2022)  Stress: No Stress Concern Present (10/19/2022)  Tobacco Use: High Risk (08/09/2023)   Additional Social History:   Current Medications   Current Facility-Administered Medications  Medication Dose Route Frequency Provider Last Rate Last Admin   acetaminophen (TYLENOL) tablet 650 mg  650 mg Oral Q6H PRN White, Patrice L, NP       albuterol (VENTOLIN HFA) 108 (90 Base) MCG/ACT inhaler 2 puff  2 puff Inhalation Q6H PRN White, Patrice L, NP       alum & mag hydroxide-simeth (MAALOX/MYLANTA) 200-200-20 MG/5ML suspension 30 mL  30 mL Oral Q4H PRN White, Patrice L, NP       ARIPiprazole (ABILIFY) tablet 5 mg  5 mg Oral Daily White, Patrice L, NP   5 mg at 09/17/23 0956   atorvastatin (LIPITOR) tablet 80 mg  80 mg Oral Daily White, Patrice L, NP   80 mg at 09/17/23 0956   cloNIDine (CATAPRES) tablet 0.1 mg  0.1 mg Oral BID PRN White, Patrice L, NP       diltiazem (CARDIZEM CD) 24 hr capsule 180 mg  180 mg Oral Daily White, Patrice L, NP   180 mg at 09/17/23 0956   hydrOXYzine (ATARAX) tablet 50 mg  50 mg Oral TID PRN White, Patrice L, NP       latanoprost (XALATAN) 0.005 % ophthalmic solution 1 drop  1 drop Both Eyes QHS Tajae Maiolo, MD       loperamide (IMODIUM)  capsule 2-4 mg  2-4 mg Oral PRN Liborio Nixon  L, NP       LORazepam (ATIVAN) tablet 1 mg  1 mg Oral Q6H PRN White, Patrice L, NP       magnesium hydroxide (MILK OF MAGNESIA) suspension 30 mL  30 mL Oral Daily PRN White, Patrice L, NP       mometasone-formoterol (DULERA) 200-5 MCG/ACT inhaler 2 puff  2 puff Inhalation BID White, Patrice L, NP   2 puff at 09/17/23 2118   multivitamin with minerals tablet 1 tablet  1 tablet Oral Daily White, Patrice L, NP   1 tablet at 09/17/23 0956   nicotine (NICODERM CQ - dosed in mg/24 hours) patch 21 mg  21 mg Transdermal Q0600 White, Patrice L, NP       ondansetron (ZOFRAN-ODT) disintegrating tablet 4 mg  4 mg Oral Q6H PRN White, Patrice L, NP       thiamine (VITAMIN B1) tablet 100 mg  100 mg Oral Daily White, Patrice L, NP   100 mg at 09/17/23 0956   traZODone (DESYREL) tablet 50 mg  50 mg Oral QHS PRN White, Patrice L, NP   50 mg at 09/17/23 2118   Current Outpatient Medications  Medication Sig Dispense Refill   albuterol (VENTOLIN HFA) 108 (90 Base) MCG/ACT inhaler Inhale 2 puffs into the lungs every 6 (six) hours as needed for shortness of breath. 18 g 1   ARIPiprazole (ABILIFY) 5 MG tablet Take 1 tablet (5 mg total) by mouth daily. 30 tablet 0   atorvastatin (LIPITOR) 80 MG tablet Take 1 tablet (80 mg total) by mouth daily. 30 tablet 0   diltiazem (CARDIZEM CD) 180 MG 24 hr capsule Take 1 capsule (180 mg total) by mouth daily. 30 capsule 0   doxepin (SINEQUAN) 75 MG capsule Take 1 capsule (75 mg total) by mouth at bedtime. 30 capsule 0   hydrOXYzine (ATARAX) 50 MG tablet Take 1 tablet (50 mg total) by mouth 3 (three) times daily as needed for anxiety. 30 tablet 0   latanoprost (XALATAN) 0.005 % ophthalmic solution Place 1 drop into both eyes at bedtime. 2.5 mL 12   mometasone-formoterol (DULERA) 200-5 MCG/ACT AERO Inhale 2 puffs into the lungs 2 (two) times daily. 1 each 1   traZODone (DESYREL) 50 MG tablet Take 1 tablet (50 mg total) by mouth at bedtime  as needed for sleep. 30 tablet 0    Labs / Images  Lab Results:  Admission on 09/15/2023, Discharged on 09/16/2023  Component Date Value Ref Range Status   WBC 09/15/2023 8.9  4.0 - 10.5 K/uL Final   RBC 09/15/2023 4.35  4.22 - 5.81 MIL/uL Final   Hemoglobin 09/15/2023 13.0  13.0 - 17.0 g/dL Final   HCT 16/08/9603 40.7  39.0 - 52.0 % Final   MCV 09/15/2023 93.6  80.0 - 100.0 fL Final   MCH 09/15/2023 29.9  26.0 - 34.0 pg Final   MCHC 09/15/2023 31.9  30.0 - 36.0 g/dL Final   RDW 54/07/8118 12.6  11.5 - 15.5 % Final   Platelets 09/15/2023 194  150 - 400 K/uL Final   nRBC 09/15/2023 0.0  0.0 - 0.2 % Final   Neutrophils Relative % 09/15/2023 52  % Final   Neutro Abs 09/15/2023 4.6  1.7 - 7.7 K/uL Final   Lymphocytes Relative 09/15/2023 36  % Final   Lymphs Abs 09/15/2023 3.2  0.7 - 4.0 K/uL Final   Monocytes Relative 09/15/2023 7  % Final   Monocytes Absolute 09/15/2023 0.7  0.1 - 1.0  K/uL Final   Eosinophils Relative 09/15/2023 4  % Final   Eosinophils Absolute 09/15/2023 0.3  0.0 - 0.5 K/uL Final   Basophils Relative 09/15/2023 1  % Final   Basophils Absolute 09/15/2023 0.1  0.0 - 0.1 K/uL Final   Immature Granulocytes 09/15/2023 0  % Final   Abs Immature Granulocytes 09/15/2023 0.03  0.00 - 0.07 K/uL Final   Performed at [facility], [address], [place], [place] [address]   Sodium 09/15/2023 138  135 - 145 mmol/L Final   Potassium 09/15/2023 4.0  3.5 - 5.1 mmol/L Final   Chloride 09/15/2023 105  98 - 111 mmol/L Final   CO2 09/15/2023 24  22 - 32 mmol/L Final   Glucose, Bld 09/15/2023 90  70 - 99 mg/dL Final   Glucose reference range applies only to samples taken after fasting for at least 8 hours.   BUN 09/15/2023 6 (L)  8 - 23 mg/dL Final   Creatinine, Ser 09/15/2023 1.17  0.61 - 1.24 mg/dL Final   Calcium 84/69/6295 8.9  8.9 - 10.3 mg/dL Final   Total Protein 28/41/3244 7.1  6.5 - 8.1 g/dL Final   Albumin 11/03/7251 3.5  3.5 - 5.0 g/dL Final   AST 66/44/0347 19   15 - 41 U/L Final   ALT 09/15/2023 15  0 - 44 U/L Final   Alkaline Phosphatase 09/15/2023 70  38 - 126 U/L Final   Total Bilirubin 09/15/2023 0.8  <1.2 mg/dL Final   GFR, Estimated 09/15/2023 >60  >60 mL/min Final   Comment: (NOTE) Calculated using the CKD-EPI Creatinine Equation (2021)    Anion gap 09/15/2023 9  5 - 15 Final   Performed at [facility], [address] [place], [place] [address]   Hgb A1c MFr Bld 09/15/2023 5.1  4.8 - 5.6 % Final   Comment: (NOTE) Pre diabetes:          5.7%-6.4%  Diabetes:              >6.4%  Glycemic control for   <7.0% adults with diabetes    Mean Plasma Glucose 09/15/2023 99.67  mg/dL Final   Performed at [facility], [address] [place], [place] [address]   Alcohol, Ethyl (B) 09/15/2023 <10  <10 mg/dL Final   Comment: (NOTE) Lowest detectable limit for serum alcohol is 10 mg/dL.  For medical purposes only. Performed at [facility], [address], [place], [place] [address]    Cholesterol 09/15/2023 159  0 - 200 mg/dL Final   Triglycerides 95/18/8416 53  <150 mg/dL Final   HDL 60/63/0160 61  >40 mg/dL Final   Total CHOL/HDL Ratio 09/15/2023 2.6  RATIO Final   VLDL 09/15/2023 11  0 - 40 mg/dL Final   LDL Cholesterol 09/15/2023 87  0 - 99 mg/dL Final   Comment:        Total Cholesterol/HDL:CHD Risk Coronary Heart Disease Risk Table                     Men   Women  1/2 Average Risk   3.4   3.3  Average Risk       5.0   4.4  2 X Average Risk   9.6   7.1  3 X Average Risk  23.4   11.0        Use the calculated Patient Ratio above and the CHD Risk Table to determine the patient's  CHD Risk.        ATP III CLASSIFICATION (LDL):  <100     mg/dL   Optimal  161-096  mg/dL   Near or Above                    Optimal  130-159  mg/dL   Borderline  045-409  mg/dL   High  >811     mg/dL   Very High Performed at [facility], [address], [place], [place] [address]    TSH 09/15/2023 3.515  0.350 - 4.500  uIU/mL Final   Comment: Performed by a 3rd Generation assay with a functional sensitivity of <=0.01 uIU/mL. Performed at [facility], [address], [place], [place] [address]    POC Amphetamine UR 09/15/2023 None Detected  NONE DETECTED (Cut Off Level 1000 ng/mL) Final   POC Secobarbital (BAR) 09/15/2023 None Detected  NONE DETECTED (Cut Off Level 300 ng/mL) Final   POC Buprenorphine (BUP) 09/15/2023 None Detected  NONE DETECTED (Cut Off Level 10 ng/mL) Final   POC Oxazepam (BZO) 09/15/2023 None Detected  NONE DETECTED (Cut Off Level 300 ng/mL) Final   POC Cocaine UR 09/15/2023 Positive (A)  NONE DETECTED (Cut Off Level 300 ng/mL) Final   POC Methamphetamine UR 09/15/2023 None Detected  NONE DETECTED (Cut Off Level 1000 ng/mL) Final   POC Morphine 09/15/2023 None Detected  NONE DETECTED (Cut Off Level 300 ng/mL) Final   POC Methadone UR 09/15/2023 None Detected  NONE DETECTED (Cut Off Level 300 ng/mL) Final   POC Oxycodone UR 09/15/2023 None Detected  NONE DETECTED (Cut Off Level 100 ng/mL) Final   POC Marijuana UR 09/15/2023 None Detected  NONE DETECTED (Cut Off Level 50 ng/mL) Final  Admission on 08/08/2023, Discharged on 08/22/2023  Component Date Value Ref Range Status   Adenovirus 08/13/2023 NOT DETECTED  NOT DETECTED Final   Coronavirus 229E 08/13/2023 NOT DETECTED  NOT DETECTED Final   Comment: (NOTE) The Coronavirus on the Respiratory Panel, DOES NOT test for the novel  Coronavirus (2019 nCoV)    Coronavirus HKU1 08/13/2023 NOT DETECTED  NOT DETECTED Final   Coronavirus NL63 08/13/2023 NOT DETECTED  NOT DETECTED Final   Coronavirus OC43 08/13/2023 NOT DETECTED  NOT DETECTED Final   Metapneumovirus 08/13/2023 NOT DETECTED  NOT DETECTED Final   Rhinovirus / Enterovirus 08/13/2023 NOT DETECTED  NOT DETECTED Final   Influenza A 08/13/2023 NOT DETECTED  NOT DETECTED Final   Influenza B 08/13/2023 NOT DETECTED  NOT DETECTED Final   Parainfluenza Virus 1 08/13/2023 NOT DETECTED   NOT DETECTED Final   Parainfluenza Virus 2 08/13/2023 NOT DETECTED  NOT DETECTED Final   Parainfluenza Virus 3 08/13/2023 NOT DETECTED  NOT DETECTED Final   Parainfluenza Virus 4 08/13/2023 NOT DETECTED  NOT DETECTED Final   Respiratory Syncytial Virus 08/13/2023 NOT DETECTED  NOT DETECTED Final   Bordetella pertussis 08/13/2023 NOT DETECTED  NOT DETECTED Final   Bordetella Parapertussis 08/13/2023 NOT DETECTED  NOT DETECTED Final   Chlamydophila pneumoniae 08/13/2023 NOT DETECTED  NOT DETECTED Final   Mycoplasma pneumoniae 08/13/2023 NOT DETECTED  NOT DETECTED Final   Performed at [facility], [address], [place], [place] [address]   SARS Coronavirus 2 by RT PCR 08/14/2023 NEGATIVE  NEGATIVE Final   Comment: (NOTE) SARS-CoV-2 target nucleic acids are NOT DETECTED.  The SARS-CoV-2 RNA is generally detectable in upper and lower respiratory specimens during the acute phase of infection. The lowest  concentration of SARS-CoV-2 viral copies this assay can detect is 250 copies / mL. A negative result does not preclude SARS-CoV-2 infection and should not be used as the sole basis for treatment or other patient management decisions.  A negative result may occur with improper specimen collection / handling, submission of specimen other than nasopharyngeal swab, presence of viral mutation(s) within the areas targeted by this assay, and inadequate number of viral copies (<250 copies / mL). A negative result must be combined with clinical observations, patient history, and epidemiological information.  Fact Sheet for Patients:   RoadLapTop.co.za  Fact Sheet for Healthcare Providers: http://kim-miller.com/  This test is not yet approved or                           cleared by the [place] FDA and has been authorized for detection and/or diagnosis of SARS-CoV-2 by FDA under an Emergency Use Authorization (EUA).  This EUA will  remain in effect (meaning this test can be used) for the duration of the COVID-19 declaration under Section 564(b)(1) of the Act, 21 U.S.C. section 360bbb-3(b)(1), unless the authorization is terminated or revoked sooner.  Performed at [facility], [address] [place], [place] [address]   Admission on 08/08/2023, Discharged on 08/08/2023  Component Date Value Ref Range Status   Sodium 08/08/2023 137  135 - 145 mmol/L Final   Potassium 08/08/2023 3.9  3.5 - 5.1 mmol/L Final   Chloride 08/08/2023 107  98 - 111 mmol/L Final   CO2 08/08/2023 20 (L)  22 - 32 mmol/L Final   Glucose, Bld 08/08/2023 148 (H)  70 - 99 mg/dL Final   Glucose reference range applies only to samples taken after fasting for at least 8 hours.   BUN 08/08/2023 9  8 - 23 mg/dL Final   Creatinine, Ser 08/08/2023 1.43 (H)  0.61 - 1.24 mg/dL Final   Calcium 60/45/4098 8.5 (L)  8.9 - 10.3 mg/dL Final   Total Protein 11/91/4782 7.1  6.5 - 8.1 g/dL Final   Albumin 95/62/1308 3.4 (L)  3.5 - 5.0 g/dL Final   AST 65/78/4696 25  15 - 41 U/L Final   ALT 08/08/2023 18  0 - 44 U/L Final   Alkaline Phosphatase 08/08/2023 76  38 - 126 U/L Final   Total Bilirubin 08/08/2023 0.7  0.3 - 1.2 mg/dL Final   GFR, Estimated 08/08/2023 53 (L)  >60 mL/min Final   Comment: (NOTE) Calculated using the CKD-EPI Creatinine Equation (2021)    Anion gap 08/08/2023 10  5 - 15 Final   Performed at [facility], [address] [place], [place] [address]   Alcohol, Ethyl (B) 08/08/2023 <10  <10 mg/dL Final   Comment: (NOTE) Lowest detectable limit for serum alcohol is 10 mg/dL.  For medical purposes only. Performed at [facility], [address] [place], [place] [address]    Salicylate Lvl 08/08/2023 <7.0 (L)  7.0 - 30.0 mg/dL Final   Performed at [facility], [address] [place], [place] [address]   Acetaminophen (Tylenol), Serum 08/08/2023 <10 (L)  10 - 30 ug/mL Final   Comment: (NOTE) Therapeutic  concentrations vary significantly. A range of 10-30 ug/mL  may be an effective concentration for many patients. However, some  are best treated at concentrations outside of this range. Acetaminophen concentrations >150 ug/mL at 4 hours after ingestion  and >50 ug/mL at 12 hours after ingestion  are often associated with  toxic reactions.  Performed at [facility], [address], [place], [place] [address]    WBC 08/08/2023 9.9  4.0 - 10.5 K/uL Final   RBC 08/08/2023 4.58  4.22 - 5.81 MIL/uL Final   Hemoglobin 08/08/2023 14.0  13.0 - 17.0 g/dL Final   HCT 19/14/7829 44.0  39.0 - 52.0 % Final   MCV 08/08/2023 96.1  80.0 - 100.0 fL Final   MCH 08/08/2023 30.6  26.0 - 34.0 pg Final   MCHC 08/08/2023 31.8  30.0 - 36.0 g/dL Final   RDW 56/21/3086 12.6  11.5 - 15.5 % Final   Platelets 08/08/2023 249  150 - 400 K/uL Final   nRBC 08/08/2023 0.0  0.0 - 0.2 % Final   Performed at [facility], [address] [place], [place] [address]   Opiates 08/08/2023 NONE DETECTED  NONE DETECTED Final   Cocaine 08/08/2023 POSITIVE (A)  NONE DETECTED Final   Benzodiazepines 08/08/2023 NONE DETECTED  NONE DETECTED Final   Amphetamines 08/08/2023 NONE DETECTED  NONE DETECTED Final   Tetrahydrocannabinol 08/08/2023 NONE DETECTED  NONE DETECTED Final   Barbiturates 08/08/2023 NONE DETECTED  NONE DETECTED Final   Comment: (NOTE) DRUG SCREEN FOR MEDICAL PURPOSES ONLY.  IF CONFIRMATION IS NEEDED FOR ANY PURPOSE, NOTIFY LAB WITHIN 5 DAYS.  LOWEST DETECTABLE LIMITS FOR URINE DRUG SCREEN Drug Class                     Cutoff (ng/mL) Amphetamine and metabolites    1000 Barbiturate and metabolites    200 Benzodiazepine                 200 Opiates and metabolites        300 Cocaine and metabolites        300 THC                            50 Performed at [facility], [address] [place], [place] [address]    SARS Coronavirus 2 by RT PCR 08/08/2023 NEGATIVE  NEGATIVE Final    Performed at [facility], [address] [place], [place] [address]  Admission on 07/15/2023, Discharged on 07/17/2023  Component Date Value Ref Range Status   Sodium 07/15/2023 135  135 - 145 mmol/L Final   Potassium 07/15/2023 3.7  3.5 - 5.1 mmol/L Final   Chloride 07/15/2023 107  98 - 111 mmol/L Final   CO2 07/15/2023 19 (L)  22 - 32 mmol/L Final   Glucose, Bld 07/15/2023 129 (H)  70 - 99 mg/dL Final   Glucose reference range applies only to samples taken after fasting for at least 8 hours.   BUN 07/15/2023 11  8 - 23 mg/dL Final   Creatinine, Ser 07/15/2023 1.39 (H)  0.61 - 1.24 mg/dL Final   Calcium 24/40/1027 8.4 (L)  8.9 - 10.3 mg/dL Final   Total Protein 25/36/6440 6.9  6.5 - 8.1 g/dL Final   Albumin 34/74/2595 3.6  3.5 - 5.0 g/dL Final   AST 63/87/5643 21  15 - 41 U/L Final   ALT 07/15/2023 17  0 - 44 U/L Final   Alkaline Phosphatase 07/15/2023 64  38 - 126 U/L Final   Total Bilirubin 07/15/2023 0.5  0.3 - 1.2 mg/dL Final   GFR, Estimated 07/15/2023 55 (L)  >60 mL/min Final   Comment: (NOTE) Calculated using the  CKD-EPI Creatinine Equation (2021)    Anion gap 07/15/2023 9  5 - 15 Final   Performed at [facility], [address] [place], [place] [address]   Alcohol, Ethyl (B) 07/15/2023 <10  <10 mg/dL Final   Comment: (NOTE) Lowest detectable limit for serum alcohol is 10 mg/dL.  For medical purposes only. Performed at [facility], [address] [place], [place] [address]    Salicylate Lvl 07/15/2023 <7.0 (L)  7.0 - 30.0 mg/dL Final   Performed at [facility], [address] [place], [place] [address]   Acetaminophen (Tylenol), Serum 07/15/2023 <10 (L)  10 - 30 ug/mL Final   Comment: (NOTE) Therapeutic concentrations vary significantly. A range of 10-30 ug/mL  may be an effective concentration for many patients. However, some  are best treated at concentrations outside of this range. Acetaminophen concentrations >150 ug/mL at 4 hours  after ingestion  and >50 ug/mL at 12 hours after ingestion are often associated with  toxic reactions.  Performed at [facility], [address] [place], [place] [address]    WBC 07/15/2023 9.1  4.0 - 10.5 K/uL Final   RBC 07/15/2023 4.20 (L)  4.22 - 5.81 MIL/uL Final   Hemoglobin 07/15/2023 13.0  13.0 - 17.0 g/dL Final   HCT 93/23/5573 40.5  39.0 - 52.0 % Final   MCV 07/15/2023 96.4  80.0 - 100.0 fL Final   MCH 07/15/2023 31.0  26.0 - 34.0 pg Final   MCHC 07/15/2023 32.1  30.0 - 36.0 g/dL Final   RDW 22/12/5425 12.6  11.5 - 15.5 % Final   Platelets 07/15/2023 267  150 - 400 K/uL Final   nRBC 07/15/2023 0.0  0.0 - 0.2 % Final   Performed at [facility], [address], [place], [place] [address]   Opiates 07/15/2023 NONE DETECTED  NONE DETECTED Final   Cocaine 07/15/2023 POSITIVE (A)  NONE DETECTED Final   Benzodiazepines 07/15/2023 NONE DETECTED  NONE DETECTED Final   Amphetamines 07/15/2023 NONE DETECTED  NONE DETECTED Final   Tetrahydrocannabinol 07/15/2023 POSITIVE (A)  NONE DETECTED Final   Barbiturates 07/15/2023 NONE DETECTED  NONE DETECTED Final   Comment: (NOTE) DRUG SCREEN FOR MEDICAL PURPOSES ONLY.  IF CONFIRMATION IS NEEDED FOR ANY PURPOSE, NOTIFY LAB WITHIN 5 DAYS.  LOWEST DETECTABLE LIMITS FOR URINE DRUG SCREEN Drug Class                     Cutoff (ng/mL) Amphetamine and metabolites    1000 Barbiturate and metabolites    200 Benzodiazepine                 200 Opiates and metabolites        300 Cocaine and metabolites        300 THC                            50 Performed at [facility], [address], [place], [place] [address]    Color, Urine 07/15/2023 YELLOW  YELLOW Final   APPearance 07/15/2023 CLEAR  CLEAR Final   Specific Gravity, Urine 07/15/2023 >1.030 (H)  1.005 - 1.030 Final   pH 07/15/2023 6.0  5.0 - 8.0 Final   Glucose, UA 07/15/2023 NEGATIVE  NEGATIVE mg/dL Final   Hgb urine dipstick 07/15/2023 NEGATIVE  NEGATIVE Final    Bilirubin Urine 07/15/2023 NEGATIVE  NEGATIVE Final  Ketones, ur 07/15/2023 NEGATIVE  NEGATIVE mg/dL Final   Protein, ur 16/08/9603 NEGATIVE  NEGATIVE mg/dL Final   Nitrite 54/07/8118 NEGATIVE  NEGATIVE Final   Leukocytes,Ua 07/15/2023 NEGATIVE  NEGATIVE Final   Comment: Microscopic not done on urines with negative protein, blood, leukocytes, nitrite, or glucose < 500 mg/dL. Performed at [facility], [address], [place], [place] [address]    SARS Coronavirus 2 by RT PCR 07/16/2023 NEGATIVE  NEGATIVE Final   Performed at [facility], [address], [place], [place] [address]  Admission on 07/12/2023, Discharged on 07/12/2023  Component Date Value Ref Range Status   Troponin I (High Sensitivity) 07/12/2023 7  <18 ng/L Final   Comment: (NOTE) Elevated high sensitivity troponin I (hsTnI) values and significant  changes across serial measurements may suggest ACS but many other  chronic and acute conditions are known to elevate hsTnI results.  Refer to the "Links" section for chest pain algorithms and additional  guidance. Performed at [facility], [address], [place], [place] [address]    Sodium 07/12/2023 136  135 - 145 mmol/L Final   Potassium 07/12/2023 4.1  3.5 - 5.1 mmol/L Final   Chloride 07/12/2023 106  98 - 111 mmol/L Final   CO2 07/12/2023 19 (L)  22 - 32 mmol/L Final   Glucose, Bld 07/12/2023 117 (H)  70 - 99 mg/dL Final   Glucose reference range applies only to samples taken after fasting for at least 8 hours.   BUN 07/12/2023 10  8 - 23 mg/dL Final   Creatinine, Ser 07/12/2023 1.17  0.61 - 1.24 mg/dL Final   Calcium 78/46/9629 8.2 (L)  8.9 - 10.3 mg/dL Final   Total Protein 52/84/1324 6.6  6.5 - 8.1 g/dL Final   Albumin 40/08/2724 3.3 (L)  3.5 - 5.0 g/dL Final   AST 36/64/4034 22  15 - 41 U/L Final   ALT 07/12/2023 16  0 - 44 U/L Final   Alkaline Phosphatase 07/12/2023 62  38 - 126 U/L Final   Total Bilirubin 07/12/2023 0.6  0.3 - 1.2 mg/dL  Final   GFR, Estimated 07/12/2023 >60  >60 mL/min Final   Comment: (NOTE) Calculated using the CKD-EPI Creatinine Equation (2021)    Anion gap 07/12/2023 11  5 - 15 Final   Performed at [facility], [address] [place], [place] [address]   Alcohol, Ethyl (B) 07/12/2023 <10  <10 mg/dL Final   Comment: (NOTE) Lowest detectable limit for serum alcohol is 10 mg/dL.  For medical purposes only. Performed at [facility], [address], [place], [place] [address]    Salicylate Lvl 07/12/2023 <7.0 (L)  7.0 - 30.0 mg/dL Final   Performed at [facility], [address] [place], [place] [address]   Acetaminophen (Tylenol), Serum 07/12/2023 <10 (L)  10 - 30 ug/mL Final   Comment: (NOTE) Therapeutic concentrations vary significantly. A range of 10-30 ug/mL  may be an effective concentration for many patients. However, some  are best treated at concentrations outside of this range. Acetaminophen concentrations >150 ug/mL at 4 hours after ingestion  and >50 ug/mL at 12 hours after ingestion are often associated with  toxic reactions.  Performed at [facility], [address], [place], [place] [address]    WBC 07/12/2023 7.9  4.0 - 10.5 K/uL Final   RBC 07/12/2023 4.43  4.22 - 5.81 MIL/uL Final   Hemoglobin 07/12/2023 13.3  13.0 - 17.0 g/dL Final   HCT 16/08/9603 41.7  39.0 - 52.0 % Final   MCV 07/12/2023 94.1  80.0 - 100.0 fL Final   MCH 07/12/2023 30.0  26.0 - 34.0 pg Final   MCHC 07/12/2023 31.9  30.0 - 36.0 g/dL Final   RDW 54/07/8118 12.5  11.5 - 15.5 % Final   Platelets 07/12/2023 255  150 - 400 K/uL Final   nRBC 07/12/2023 0.0  0.0 - 0.2 % Final   Performed at [facility], [address] [place], [place] [address]   Opiates 07/12/2023 NONE DETECTED  NONE DETECTED Final   Cocaine 07/12/2023 POSITIVE (A)  NONE DETECTED Final   Benzodiazepines 07/12/2023 NONE DETECTED  NONE DETECTED Final   Amphetamines 07/12/2023 NONE DETECTED  NONE DETECTED  Final   Tetrahydrocannabinol 07/12/2023 POSITIVE (A)  NONE DETECTED Final   Barbiturates 07/12/2023 NONE DETECTED  NONE DETECTED Final   Comment: (NOTE) DRUG SCREEN FOR MEDICAL PURPOSES ONLY.  IF CONFIRMATION IS NEEDED FOR ANY PURPOSE, NOTIFY LAB WITHIN 5 DAYS.  LOWEST DETECTABLE LIMITS FOR URINE DRUG SCREEN Drug Class                     Cutoff (ng/mL) Amphetamine and metabolites    1000 Barbiturate and metabolites    200 Benzodiazepine                 200 Opiates and metabolites        300 Cocaine and metabolites        300 THC                            50 Performed at [facility], [address], [place], [place] [address]    Troponin I (High Sensitivity) 07/12/2023 7  <18 ng/L Final   Comment: (NOTE) Elevated high sensitivity troponin I (hsTnI) values and significant  changes across serial measurements may suggest ACS but many other  chronic and acute conditions are known to elevate hsTnI results.  Refer to the "Links" section for chest pain algorithms and additional  guidance. Performed at [facility], [address] [place], [place] [address]   Admission on 06/01/2023, Discharged on 06/01/2023  Component Date Value Ref Range Status   Fecal Occult Bld 06/01/2023 POSITIVE (A)  NEGATIVE Final   Sodium 06/01/2023 135  135 - 145 mmol/L Final   Potassium 06/01/2023 4.0  3.5 - 5.1 mmol/L Final   Chloride 06/01/2023 102  98 - 111 mmol/L Final   CO2 06/01/2023 22  22 - 32 mmol/L Final   Glucose, Bld 06/01/2023 100 (H)  70 - 99 mg/dL Final   Glucose reference range applies only to samples taken after fasting for at least 8 hours.   BUN 06/01/2023 13  8 - 23 mg/dL Final   Creatinine, Ser 06/01/2023 1.35 (H)  0.61 - 1.24 mg/dL Final   Calcium 78/46/9629 8.6 (L)  8.9 - 10.3 mg/dL Final   Total Protein 52/84/1324 6.8  6.5 - 8.1 g/dL Final   Albumin 40/08/2724 3.5  3.5 - 5.0 g/dL Final   AST 36/64/4034 26  15 - 41 U/L Final   ALT 06/01/2023 18  0 - 44 U/L Final    Alkaline Phosphatase 06/01/2023 65  38 - 126 U/L Final   Total Bilirubin 06/01/2023 0.8  0.3 - 1.2 mg/dL Final   GFR, Estimated 06/01/2023 57 (L)  >60 mL/min  Final   Comment: (NOTE) Calculated using the CKD-EPI Creatinine Equation (2021)    Anion gap 06/01/2023 11  5 - 15 Final   Performed at [facility], [address], [place], [place] [address]   WBC 06/01/2023 11.7 (H)  4.0 - 10.5 K/uL Final   RBC 06/01/2023 3.94 (L)  4.22 - 5.81 MIL/uL Final   Hemoglobin 06/01/2023 12.2 (L)  13.0 - 17.0 g/dL Final   HCT 40/98/1191 37.8 (L)  39.0 - 52.0 % Final   MCV 06/01/2023 95.9  80.0 - 100.0 fL Final   MCH 06/01/2023 31.0  26.0 - 34.0 pg Final   MCHC 06/01/2023 32.3  30.0 - 36.0 g/dL Final   RDW 47/82/9562 12.4  11.5 - 15.5 % Final   Platelets 06/01/2023 238  150 - 400 K/uL Final   nRBC 06/01/2023 0.0  0.0 - 0.2 % Final   Neutrophils Relative % 06/01/2023 75  % Final   Neutro Abs 06/01/2023 8.7 (H)  1.7 - 7.7 K/uL Final   Lymphocytes Relative 06/01/2023 16  % Final   Lymphs Abs 06/01/2023 1.9  0.7 - 4.0 K/uL Final   Monocytes Relative 06/01/2023 7  % Final   Monocytes Absolute 06/01/2023 0.9  0.1 - 1.0 K/uL Final   Eosinophils Relative 06/01/2023 0  % Final   Eosinophils Absolute 06/01/2023 0.0  0.0 - 0.5 K/uL Final   Basophils Relative 06/01/2023 1  % Final   Basophils Absolute 06/01/2023 0.1  0.0 - 0.1 K/uL Final   Immature Granulocytes 06/01/2023 1  % Final   Abs Immature Granulocytes 06/01/2023 0.10 (H)  0.00 - 0.07 K/uL Final   Performed at [facility], [address] [place], [place] [address]   Prothrombin Time 06/01/2023 13.7  11.4 - 15.2 seconds Final   INR 06/01/2023 1.0  0.8 - 1.2 Final   Comment: (NOTE) INR goal varies based on device and disease states. Performed at [facility], [address] [place], [place] [address]    ABO/RH(D) 06/01/2023 O POS   Final   Antibody Screen 06/01/2023 NEG   Final   Sample Expiration 06/01/2023    Final                    Value:06/04/2023,2359 Performed at [facility], [address], [place], [place] [address]    ABO/RH(D) 06/01/2023    Final                   Value:O POS Performed at [facility], [address], [place], [place] [address]   Admission on 05/24/2023, Discharged on 05/25/2023  Component Date Value Ref Range Status   Sodium 05/24/2023 139  135 - 145 mmol/L Final   Potassium 05/24/2023 3.5  3.5 - 5.1 mmol/L Final   Chloride 05/24/2023 105  98 - 111 mmol/L Final   CO2 05/24/2023 21 (L)  22 - 32 mmol/L Final   Glucose, Bld 05/24/2023 117 (H)  70 - 99 mg/dL Final   Glucose reference range applies only to samples taken after fasting for at least 8 hours.   BUN 05/24/2023 7 (L)  8 - 23 mg/dL Final   Creatinine, Ser 05/24/2023 1.10  0.61 - 1.24 mg/dL Final   Calcium 24/40/1027 8.7 (L)  8.9 - 10.3 mg/dL Final   Total Protein 25/36/6440 6.9  6.5 - 8.1 g/dL Final   Albumin 34/74/2595 3.6  3.5 - 5.0 g/dL Final  AST 05/24/2023 19  15 - 41 U/L Final   ALT 05/24/2023 14  0 - 44 U/L Final   Alkaline Phosphatase 05/24/2023 62  38 - 126 U/L Final   Total Bilirubin 05/24/2023 0.7  0.3 - 1.2 mg/dL Final   GFR, Estimated 05/24/2023 >60  >60 mL/min Final   Comment: (NOTE) Calculated using the CKD-EPI Creatinine Equation (2021)    Anion gap 05/24/2023 13  5 - 15 Final   Performed at [facility], [address] [place], [place] [address]   Alcohol, Ethyl (B) 05/24/2023 18 (H)  <10 mg/dL Final   Comment: (NOTE) Lowest detectable limit for serum alcohol is 10 mg/dL.  For medical purposes only. Performed at [facility], [address], [place], [place] [address]    Salicylate Lvl 05/24/2023 <7.0 (L)  7.0 - 30.0 mg/dL Final   Performed at [facility], [address] [place], [place] [address]   Acetaminophen (Tylenol), Serum 05/24/2023 <10 (L)  10 - 30 ug/mL Final   Comment: (NOTE) Therapeutic concentrations vary significantly. A range of 10-30 ug/mL  may be  an effective concentration for many patients. However, some  are best treated at concentrations outside of this range. Acetaminophen concentrations >150 ug/mL at 4 hours after ingestion  and >50 ug/mL at 12 hours after ingestion are often associated with  toxic reactions.  Performed at [facility], [address], [place], [place] [address]    WBC 05/24/2023 7.7  4.0 - 10.5 K/uL Final   RBC 05/24/2023 4.01 (L)  4.22 - 5.81 MIL/uL Final   Hemoglobin 05/24/2023 12.5 (L)  13.0 - 17.0 g/dL Final   HCT 66/44/0347 38.3 (L)  39.0 - 52.0 % Final   MCV 05/24/2023 95.5  80.0 - 100.0 fL Final   MCH 05/24/2023 31.2  26.0 - 34.0 pg Final   MCHC 05/24/2023 32.6  30.0 - 36.0 g/dL Final   RDW 42/59/5638 12.7  11.5 - 15.5 % Final   Platelets 05/24/2023 255  150 - 400 K/uL Final   nRBC 05/24/2023 0.0  0.0 - 0.2 % Final   Performed at [facility], [address] [place], [place] [address]   Opiates 05/24/2023 NONE DETECTED  NONE DETECTED Final   Cocaine 05/24/2023 POSITIVE (A)  NONE DETECTED Final   Benzodiazepines 05/24/2023 NONE DETECTED  NONE DETECTED Final   Amphetamines 05/24/2023 NONE DETECTED  NONE DETECTED Final   Tetrahydrocannabinol 05/24/2023 NONE DETECTED  NONE DETECTED Final   Barbiturates 05/24/2023 NONE DETECTED  NONE DETECTED Final   Comment: (NOTE) DRUG SCREEN FOR MEDICAL PURPOSES ONLY.  IF CONFIRMATION IS NEEDED FOR ANY PURPOSE, NOTIFY LAB WITHIN 5 DAYS.  LOWEST DETECTABLE LIMITS FOR URINE DRUG SCREEN Drug Class                     Cutoff (ng/mL) Amphetamine and metabolites    1000 Barbiturate and metabolites    200 Benzodiazepine                 200 Opiates and metabolites        300 Cocaine and metabolites        300 THC                            50 Performed at [facility], [address] [place], [place] [address]    Troponin I (High Sensitivity) 05/24/2023  14  <18 ng/L Final   Comment: (NOTE) Elevated high sensitivity troponin I (hsTnI) values  and significant  changes across serial measurements may suggest ACS but many other  chronic and acute conditions are known to elevate hsTnI results.  Refer to the "Links" section for chest pain algorithms and additional  guidance. Performed at [facility], [address], [place], [place] [address]    Troponin I (High Sensitivity) 05/24/2023 13  <18 ng/L Final   Comment: (NOTE) Elevated high sensitivity troponin I (hsTnI) values and significant  changes across serial measurements may suggest ACS but many other  chronic and acute conditions are known to elevate hsTnI results.  Refer to the "Links" section for chest pain algorithms and additional  guidance. Performed at [facility], [address] [place], [place] [address]   Admission on 04/05/2023, Discharged on 04/06/2023  Component Date Value Ref Range Status   Acetaminophen (Tylenol), Serum 04/05/2023 <10 (L)  10 - 30 ug/mL Final   Comment: (NOTE) Therapeutic concentrations vary significantly. A range of 10-30 ug/mL  may be an effective concentration for many patients. However, some  are best treated at concentrations outside of this range. Acetaminophen concentrations >150 ug/mL at 4 hours after ingestion  and >50 ug/mL at 12 hours after ingestion are often associated with  toxic reactions.  Performed at [facility], [address] [place], [place] [address]    WBC 04/05/2023 11.2 (H)  4.0 - 10.5 K/uL Final   RBC 04/05/2023 4.39  4.22 - 5.81 MIL/uL Final   Hemoglobin 04/05/2023 13.3  13.0 - 17.0 g/dL Final   HCT 91/47/8295 42.6  39.0 - 52.0 % Final   MCV 04/05/2023 97.0  80.0 - 100.0 fL Final   MCH 04/05/2023 30.3  26.0 - 34.0 pg Final   MCHC 04/05/2023 31.2  30.0 - 36.0 g/dL Final   RDW 62/13/0865 13.0  11.5 - 15.5 % Final   Platelets 04/05/2023 276  150 - 400 K/uL Final   nRBC 04/05/2023 0.0  0.0 - 0.2 % Final   Neutrophils Relative % 04/05/2023 61  % Final   Neutro Abs 04/05/2023 6.9  1.7 - 7.7  K/uL Final   Lymphocytes Relative 04/05/2023 26  % Final   Lymphs Abs 04/05/2023 2.9  0.7 - 4.0 K/uL Final   Monocytes Relative 04/05/2023 9  % Final   Monocytes Absolute 04/05/2023 1.0  0.1 - 1.0 K/uL Final   Eosinophils Relative 04/05/2023 3  % Final   Eosinophils Absolute 04/05/2023 0.3  0.0 - 0.5 K/uL Final   Basophils Relative 04/05/2023 1  % Final   Basophils Absolute 04/05/2023 0.1  0.0 - 0.1 K/uL Final   Immature Granulocytes 04/05/2023 0  % Final   Abs Immature Granulocytes 04/05/2023 0.03  0.00 - 0.07 K/uL Final   Performed at [facility], [address] [place], [place] [address]   Sodium 04/05/2023 140  135 - 145 mmol/L Final   Potassium 04/05/2023 4.1  3.5 - 5.1 mmol/L Final   Chloride 04/05/2023 106  98 - 111 mmol/L Final   CO2 04/05/2023 25  22 - 32 mmol/L Final   Glucose, Bld 04/05/2023 104 (H)  70 - 99 mg/dL Final   Glucose reference range applies only to samples taken after fasting for at least 8 hours.   BUN 04/05/2023 15  8 - 23 mg/dL Final   Creatinine, Ser 04/05/2023 1.59 (H)  0.61 - 1.24 mg/dL Final  Calcium 04/05/2023 9.0  8.9 - 10.3 mg/dL Final   Total Protein 40/98/1191 7.2  6.5 - 8.1 g/dL Final   Albumin 47/82/9562 3.5  3.5 - 5.0 g/dL Final   AST 13/06/6577 22  15 - 41 U/L Final   ALT 04/05/2023 14  0 - 44 U/L Final   Alkaline Phosphatase 04/05/2023 83  38 - 126 U/L Final   Total Bilirubin 04/05/2023 0.6  0.3 - 1.2 mg/dL Final   GFR, Estimated 04/05/2023 47 (L)  >60 mL/min Final   Comment: (NOTE) Calculated using the CKD-EPI Creatinine Equation (2021)    Anion gap 04/05/2023 9  5 - 15 Final   Performed at [facility], [address] [place], [place] [address]   Alcohol, Ethyl (B) 04/05/2023 <10  <10 mg/dL Final   Comment: (NOTE) Lowest detectable limit for serum alcohol is 10 mg/dL.  For medical purposes only. Performed at [facility], [address] [place], [place] [address]    Opiates 04/05/2023 NONE DETECTED  NONE  DETECTED Final   Cocaine 04/05/2023 POSITIVE (A)  NONE DETECTED Final   Benzodiazepines 04/05/2023 NONE DETECTED  NONE DETECTED Final   Amphetamines 04/05/2023 NONE DETECTED  NONE DETECTED Final   Tetrahydrocannabinol 04/05/2023 NONE DETECTED  NONE DETECTED Final   Barbiturates 04/05/2023 NONE DETECTED  NONE DETECTED Final   Comment: (NOTE) DRUG SCREEN FOR MEDICAL PURPOSES ONLY.  IF CONFIRMATION IS NEEDED FOR ANY PURPOSE, NOTIFY LAB WITHIN 5 DAYS.  LOWEST DETECTABLE LIMITS FOR URINE DRUG SCREEN Drug Class                     Cutoff (ng/mL) Amphetamine and metabolites    1000 Barbiturate and metabolites    200 Benzodiazepine                 200 Opiates and metabolites        300 Cocaine and metabolites        300 THC                            50 Performed at [facility], [address] [place], [place] [address]    Salicylate Lvl 04/05/2023 <7.0 (L)  7.0 - 30.0 mg/dL Final   Performed at [facility], [address], [place], [place] [address]   Troponin I (High Sensitivity) 04/05/2023 12  <18 ng/L Final   Comment: (NOTE) Elevated high sensitivity troponin I (hsTnI) values and significant  changes across serial measurements may suggest ACS but many other  chronic and acute conditions are known to elevate hsTnI results.  Refer to the "Links" section for chest pain algorithms and additional  guidance. Performed at [facility], [address], [place], [place] [address]    Troponin I (High Sensitivity) 04/05/2023 12  <18 ng/L Final   Comment: (NOTE) Elevated high sensitivity troponin I (hsTnI) values and significant  changes across serial measurements may suggest ACS but many other  chronic and acute conditions are known to elevate hsTnI results.  Refer to the "Links" section for chest pain algorithms and additional  guidance. Performed at [facility], [address], [place], [place] [address]    SARS Coronavirus 2 by RT PCR 04/05/2023 NEGATIVE   NEGATIVE Final   Performed at [facility] Lab, [address], [place], [place] [address]  [facility]regational Nurse Program on 03/22/2023  Component Date Value  Ref Range Status   POC Glucose 03/22/2023 140 (A)  70 - 99 mg/dl Final   Blood Alcohol level:  Lab Results  Component Value Date   ETH <10 09/15/2023   ETH <10 08/08/2023   Metabolic Disorder Labs: Lab Results  Component Value Date   HGBA1C 5.1 09/15/2023   MPG 99.67 09/15/2023   MPG 103 01/13/2023   No results found for: "PROLACTIN" Lab Results  Component Value Date   CHOL 159 09/15/2023   TRIG 53 09/15/2023   HDL 61 09/15/2023   CHOLHDL 2.6 09/15/2023   VLDL 11 09/15/2023   LDLCALC 87 09/15/2023   LDLCALC 99 01/13/2023   Therapeutic Lab Levels: No results found for: "LITHIUM" No results found for: "VALPROATE" No results found for: "CBMZ" Physical Findings   AIMS    Flowsheet Row [facility]) [facility] Ad[facility] ([facility]) [facility]  AIMS Total Score 0 0      AUDIT    Flowsheet Row Admission (Discharged) from 08/08/2023 in [facility] BE[facility] Admission (Discharged) from 04/06/2023 in [facility] BE[facility]IORAL [facility] Admission (Discharged) from 01/10/2023 in [facility] BE[facility]AVIORAL MEDICINE Admission (Discharged) from 08/11/2022 in [facility] Admission (Discharged) [facility]  Alcohol Use Disorder Identification Test Final Score (AUDIT) 29 0 13 0 7      CAGE-AID    Flowsheet Row ED to Hosp-Admission (Discharged) from 06/04/2020 in [place] [facility]  CAGE-AID Score 3      GAD-7    Flowsheet Row Office Visit from 01/11/2022 in [facility] [place] [facility] [place]. [facility] Office Visit from 09/28/2021 in [facility] [place] [facility] Dept Of [facility]a [facility].  [facility] Office Visit from 02/26/2021 in [facility] Office Visit from 12/25/2020 in [facility] Integrated Behavioral Health from 07/16/2020 in [facility] [place] [facility] Of [place]. [facility]  Total GAD-7 Score 7 10 11 7 14       PHQ2-9    Flowsheet Row ED from 09/16/2023 in [facility] Clinical Support from 10/19/2022 in [facility] - A Dept Of [place]. [facility] Office Visit from 05/11/2022 in [facility] - A Dept Of Eligha Bridegroom. [facility] Patient Outreach Telephone from 03/31/2022 in [facility] Patient Outreach Telephone from 01/14/2022 in [facility] Community Care Coordination  PHQ-2 Total Score 1 0 2 0 1  PHQ-9 Total Score -- -- 5 -- --      Flowsheet Row ED from 09/16/2023 in [facility] from 09/15/2023 in [facility] Admission (Discharged) from 08/08/2023 in [facility] BEHAVIORAL MEDICINE  C-SSRS RISK CATEGORY No Risk No Risk High Risk       Musculoskeletal  Strength & Muscle Tone: within normal limits Gait & Station: normal Patient leans: N/A   Strength & Muscle Tone: within normal limits Gait & Station: unsteady, mildly unsteady Patient leans: Front  Psychiatric Specialty Exam   Presentation  General Appearance:Appropriate for Environment, Casual Eye Contact:Fair Speech:Clear and Coherent, Normal Rate Volume:Decreased Handedness:Right  Mood and Affect  Mood:Euthymic Affect:Appropriate, Congruent  Thought Process  Thought Process:Coherent, Goal Directed Descriptions of Associations:Intact  Thought Content Suicidal Thoughts:Suicidal Thoughts: No Homicidal Thoughts:Homicidal  Thoughts: No Hallucinations:Hallucinations: None Ideas of Reference:None Thought Content:Logical, WDL  Sensorium   Memory:Immediate Good, Recent Good Judgment:Fair Insight:Fair  Executive Functions  Orientation:Full (Time, Place and Person) Language:Fair Concentration:Fair Attention:Good Recall:Fair Fund of Knowledge:Fair  Psychomotor Activity  Psychomotor Activity:Psychomotor Activity: Normal  Assets  Assets:Communication Skills, Desire for Improvement, Social Support, Housing  Sleep  Quality:Good  Physical Exam  BP 134/77 (BP Location: Right Arm)   Pulse 62   Temp 98.6 F (37 C) (Oral)   Resp 18   SpO2 100%  Physical Exam Vitals reviewed.  Constitutional:      General: He is not in acute distress.    Appearance: He is not toxic-appearing.  HENT:     Head: Normocephalic and atraumatic.  Pulmonary:     Effort: Pulmonary effort is normal.  Skin:    General: Skin is warm and dry.  Neurological:     Mental Status: He is alert.     Gait: Gait abnormal.      Assessment / Plan  Total Time spent with patient: 30 minutes Treatment Plan Summary: Daily contact with patient to assess and evaluate symptoms and progress in treatment and Medication management  Principal Problem:   Alcohol use with alcohol-induced mood disorder [facility])   Mr. Mell Thane is a 67 year old male with a past psychiatric history history of multiple inpatient hospitalizations (x3 2024, most recent in October), major depressive disorder, cocaine use disorder, alcohol use disorder and past medical history of COPD, hypertension, NSTEMI, CAD who presented to the behavioral health urgent care unaccompanied requesting detox from alcohol and cocaine. Patient continues to deny depressive symptoms. Goal is to help him get through this detox period. While inpatient rehab is recommended for him, he requests intensive outpatient services.  He plans to pursue residential substance use treatment after the holidays.   Labs notable for Cr 1.17, AST 19, ALT 15. Lipid panel wnl. Alc <10. UDS+cocaine. QTc 437.    #Alcohol use  disorder #Cocaine use disorder  -Previously discontinued ativan taper -Continue CIWA monitoring: 0 over the past 24 hours; if remains less than 4, we will discontinue tomorrow. -PRN ativan for CIWA >10  -PRN: atarax, tylenol, maalox, imodium, milk of mg, zofran -daily MV, thiamine  -recommend abstinence from cocaine    #History of MDD #Alcohol-Induced Mood Disorder Has been noncompliant with medications -restart home abilify 5    #HTN -Continue cardizem 180 daily   #HLD -Continue lipitor 80   #COPD -Continue dulera, PRN albuterol    #Tobacco use disorder -declines NRT  #Cataracts -Reached out to pharmacy to discuss alternative to Lantaprost ophthalmic solution.;  Awaiting answer and will put in order upon response.   Dispo: Patient set up with CDIOP appointment on Wednesday 1pm   Signed: Lamar Sprinkles, MD Psychiatry Resident, PGY-3 09/18/2023, 8:52 AM   [facility] [address] [place], [place] [address] Dept: [phone] Dept Fax: [phone]

## 2023-09-19 DIAGNOSIS — F1094 Alcohol use, unspecified with alcohol-induced mood disorder: Secondary | ICD-10-CM | POA: Diagnosis not present

## 2023-09-19 DIAGNOSIS — F1014 Alcohol abuse with alcohol-induced mood disorder: Secondary | ICD-10-CM | POA: Diagnosis not present

## 2023-09-19 NOTE — ED Notes (Signed)
 Patient alert & oriented x4. Denies intent to harm self or others when asked. Denies A/VH. Patient denies any physical complaints when asked. No acute distress noted. Support and encouragement provided. Routine safety checks conducted per facility protocol. Encouraged patient to notify staff if any thoughts of harm towards self or others arise. Patient verbalizes understanding and agreement.

## 2023-09-19 NOTE — ED Notes (Signed)
Patient in the bedroom calm and sleeping.   NAD.Respirations are even and unlabored. Will keep monitoring for safety.

## 2023-09-19 NOTE — ED Notes (Signed)
Patient resting with eyes closed in no apparent acute distress. Respirations even and unlabored. Environment secured. Safety checks in place according to facility policy.

## 2023-09-19 NOTE — ED Notes (Signed)
Patient has been resting comfortably this shift. NO distress noted. NO s/s of pain. Respirations even and non labored.

## 2023-09-19 NOTE — ED Notes (Signed)
Patient in the Dayroom calm watching TV. Denies SI/HI/AVH.No complaints at this time.  NAD.Respirations are even and unlabored. Will keep monitoring for safety.

## 2023-09-19 NOTE — Group Note (Signed)
Group Topic: Change and Accountability (AA Group Meeting) Group Date: 09/19/2023 Start Time: 1000 End Time: 1110 Facilitators: Vonzell Schlatter B  Department: [facility]  Number of Participants: 4  Group Focus: activities of daily living skills and daily focus Treatment Modality:  Psychoeducation Interventions utilized were problem solving and support Purpose: express feelings and regain self-worth  Name: Robert Chan Date of Birth: August 28, 1956  MR: [id number]    Level of Participation: active Quality of Participation: attentive and cooperative Interactions with others: gave feedback Mood/Affect: positive Triggers (if applicable): n/a Cognition: coherent/clear Progress: Moderate Response: told about his life and why he was here Plan: follow-up needed  Patients Problems:  Patient Active Problem List   Diagnosis Date Noted   Alcohol use with alcohol-induced mood disorder (HCC) 09/16/2023   MDD (major depressive disorder), recurrent severe, without psychosis (HCC) 08/08/2023   MDD (major depressive disorder), recurrent episode, mild (HCC) 07/12/2023   Cocaine use 05/25/2023   PAD (peripheral artery disease) (HCC) 12/01/2021   Atopic dermatitis 04/22/2021   Former tobacco use    Polysubstance abuse (HCC) 09/06/2020   Vitamin D deficiency 06/23/2020   Coronary artery disease 06/19/2020   History of non-ST elevation myocardial infarction (NSTEMI)    Foot callus 12/12/2019   Age-related nuclear cataract of right eye 09/13/2016   Benign hypertension 07/06/2016   Glaucoma 07/06/2016   Male erectile disorder 07/06/2016   Asthma 10/18/2012   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

## 2023-09-19 NOTE — Group Note (Signed)
Group Topic: Communication  Group Date: 09/19/2023 Start Time: 2000 End Time: 2100 Facilitators: Rae Lips B  Department: [facility]  Number of Participants: 1  Group Focus: abuse issues, acceptance, activities of daily living skills, communication, concentration, coping skills, daily focus, discharge education, and individual meeting Treatment Modality:  Leisure Counsellor and Patient-Centered Therapy Interventions utilized were leisure development, patient education, problem solving, reality testing, story telling, and support Purpose: enhance coping skills, express feelings, increase insight, reinforce self-care, and relapse prevention strategies  Name: Robert Chan Date of Birth: 1956/02/06  MR: [id number]    Level of Participation: PT DID NOT ATTEND GROUPS Quality of Participation: cooperative Interactions with others: gave feedback Mood/Affect: appropriate Triggers (if applicable): NA Cognition: coherent/clear Progress: None Response: NA Plan: patient will be encouraged to go to groups.   Patients Problems:  Patient Active Problem List   Diagnosis Date Noted   Alcohol use with alcohol-induced mood disorder (HCC) 09/16/2023   MDD (major depressive disorder), recurrent severe, without psychosis (HCC) 08/08/2023   MDD (major depressive disorder), recurrent episode, mild (HCC) 07/12/2023   Cocaine use 05/25/2023   PAD (peripheral artery disease) (HCC) 12/01/2021   Atopic dermatitis 04/22/2021   Former tobacco use    Polysubstance abuse (HCC) 09/06/2020   Vitamin D deficiency 06/23/2020   Coronary artery disease 06/19/2020   History of non-ST elevation myocardial infarction (NSTEMI)    Foot callus 12/12/2019   Age-related nuclear cataract of right eye 09/13/2016   Benign hypertension 07/06/2016   Glaucoma 07/06/2016   Male erectile disorder 07/06/2016   Asthma 10/18/2012   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

## 2023-09-19 NOTE — ED Provider Notes (Signed)
[facility] Progress Note  Date & Time: 09/19/2023 8:32 AM Name: Robert Chan Age: 67 y.o.  DOB: 1956-10-17  MRN: [id number]  Diagnosis:  Final diagnoses:  Alcohol-induced mood disorder (HCC)  COPD with chronic bronchitis (HCC)  Moderate episode of recurrent major depressive disorder (HCC)  PAD (peripheral artery disease) (HCC)  Essential hypertension  Cocaine use disorder (HCC)  Tobacco use disorder    Reason for presentation: detox from alcohol and cocaine  Brief HPI  Robert Chan is a 67 y.o. male, with past psychiatric history history of multiple inpatient hospitalizations (x3 2024, most recent in October), major depressive disorder, cocaine use disorder, alcohol use disorder and past medical history of COPD, hypertension, NSTEMI, CAD, who presented Voluntary to [facility] (09/19/2023) then admitted to [facility] for detox from alcohol and cocaine. He is not interested in residential rehab.      Component Value Date/Time   ETH <10 09/15/2023 1652       Component Value Date/Time   LABOPIA NONE DETECTED 08/08/2023 1730   COCAINSCRNUR POSITIVE (A) 08/08/2023 1730   LABBENZ NONE DETECTED 08/08/2023 1730   AMPHETMU NONE DETECTED 08/08/2023 1730   THCU NONE DETECTED 08/08/2023 1730   LABBARB NONE DETECTED 08/08/2023 1730     Interval Hx   Patient Narrative:  On assessment today, patient reports that he is doing well. He states that he is currently receiving treatment for crack cocaine and alcohol use. We discussed the patient's plan when he is discharged and he stated that he would like to do outpatient treatment. He was encouraged to consider residential rehab and he stated that he would consider that if he continued to use drugs and alcohol while in outpatient treatment. He denies any SI/HI/AVH.  ROS   Past History   Psychiatric History:  Patient reports a history of depression. He reports a past psychiatric hospitalization in  October 2020 for and a suicide attempt "a while ago."  Per chart review patient has had multiple inpatient hospitalizations for major depressive disorder with suicidal ideations, he has had 3 within the past year.  He has had medication trials of Abilify, doxepin, Remeron, Wellbutrin.  He has been noncompliant with medications after previous discharges and with outpatient follow-up.  PCP: Storm Frisk, MD   Psychiatric Family History:  Patient states that his 2 brothers died from alcohol and drug use.   Social History:   he lives by himself. He reports that he supports himself with disability check from [employer]. He reports that he lives in an apartment in [place]. He reports that he has been separated from his wife for a while. He reports that he has a daughter in [place] and 6 grandchildren. He reports that his family is in [place]   EtOH:  reports current alcohol use. Began in early teens and drinking approximately 2 to 3 ounce bottles of alcohol per day.  Tobacco:  reports that he has been smoking cigarettes. He has never used smokeless tobacco.  Cannabis: Denies Opiates: Denies Stimulants: Cocaine- did not specify quantity BZO/hypnotics: Denies Seizure/DT: Denies  Treatments: rehab in [place] 8 to 9 years ago  IVDU: Denies  Past Medical History:  Past Medical History:  Diagnosis Date   Acute bilateral low back pain without sciatica 02/27/2021   Asthma    CAD (coronary artery disease)    Cataract    CKD (chronic kidney disease)    Cocaine use disorder, mild, abuse (HCC)    COPD (chronic obstructive pulmonary  disease) (HCC)    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   Depression    Elevated serum creatinine 08/28/2019   Homelessness 06/19/2020   Homicidal ideations 05/25/2023   Hypertension    Major depressive disorder, recurrent episode, 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/11/2022   Major depressive disorder, single episode, 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/10/2022   MDD (major depressive  disorder), recurrent severe, 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   NSTEMI (non-ST elevated myocardial infarction) (HCC)    Schizoaffective disorder (HCC) 11/08/2022   Status post incision and drainage 04/22/2021   Suicidal ideation 06/05/2020   Suicidal ideation 06/05/2020    Past Surgical History:  Procedure Laterality Date   APPENDECTOMY     EYE SURGERY     LEFT HEART CATH AND CORONARY ANGIOGRAPHY N/A 06/05/2020   Procedure: LEFT HEART CATH AND CORONARY ANGIOGRAPHY;  Surgeon: Elder Negus, MD;  Location: [facility] INVASIVE CV LAB;  Service: Cardiovascular;  Laterality: N/A;   PROSTATE SURGERY     Family History:  Family History  Problem Relation Age of Onset   Hypertension Mother    Diabetes Mother    Hypertension Father    Hypertension Sister    Social History   Substance and Sexual Activity  Alcohol Use Yes   Comment: 4 40oz beer a week    Social History   Substance and Sexual Activity  Drug Use Not Currently   Types: Cocaine   Comment: cocaine use once a month, last use 6 mos ago    Social History   Socioeconomic History   Marital status: Single    Spouse name: Not on file   Number of children: Not on file   Years of education: Not on file   Highest education level: Not on file  Occupational History   Not on file  Tobacco Use   Smoking status: Some Days    Current packs/day: 0.00    Types: Cigarettes    Last attempt to quit: 12/25/2020    Years since quitting: 2.7   Smokeless tobacco: Never   Tobacco comments:    Smokes a couple cigarettes every now and then  Vaping Use   Vaping status: Never Used  Substance and Sexual Activity   Alcohol use: Yes    Comment: 4 40oz beer a week   Drug use: Not Currently    Types: Cocaine    Comment: cocaine use once a month, last use 6 mos ago   Sexual activity: Not Currently  Other Topics Concern   Not on file  Social History Narrative   His mother passed at age 30 (when he was 60 years old) and his father raised  him and his siblings   His parents were pastors as a lot of his sisters and other family members are today   There was a total of 13 children in his family Had 65 sisters, one sister deceased , Had 3 brothers, all brothers deceased   He is the youngest of the 2 and was "always in trouble in my younger years"   Family still live in [place], [place], some in [place], [place]   Has a Daughter and son with a total of 10 grandchildren (6 grand daughters local and 4 grandsons)    Values family   Married twice, present wife incarcerated   Social Determinants of Health   Financial Resource Strain: Low Risk  (10/19/2022)   Overall Financial Resource Strain (CARDIA)    Difficulty of Paying Living Expenses: Not hard at all  Food Insecurity: Food Insecurity  Present (09/16/2023)   Hunger Vital Sign    Worried About Running Out of Food in the Last Year: Sometimes true    Ran Out of Food in the Last Year: Sometimes true  Transportation Needs: Unmet Transportation Needs (09/16/2023)   PRAPARE - Administrator, Civil Service (Medical): Yes    Lack of Transportation (Non-Medical): Yes  Physical Activity: Inactive (10/19/2022)   Exercise Vital Sign    Days of Exercise per Week: 0 days    Minutes of Exercise per Session: 0 min  Stress: No Stress Concern Present (10/19/2022)   [name] of Occupational Health - Occupational Stress Questionnaire    Feeling of Stress : Not at all  Social Connections: Moderately Integrated (03/31/2022)   Social Connection and Isolation Panel [NHANES]    Frequency of Communication with Friends and Family: Three times a week    Frequency of Social Gatherings with Friends and Family: Three times a week    Attends Religious Services: 1 to 4 times per year    Active Member of Clubs or Organizations: Yes    Attends Banker Meetings: 1 to 4 times per year    Marital Status: Never married   SDOH: SDOH Screenings   Food Insecurity: Food  Insecurity Present (09/16/2023)  Housing: Patient Declined (09/16/2023)  Transportation Needs: Unmet Transportation Needs (09/16/2023)  Utilities: Not At Risk (09/16/2023)  Alcohol Screen: Low Risk  (09/16/2023)  Recent Concern: Alcohol Screen - High Risk (08/08/2023)  Depression (PHQ2-9): Low Risk  (09/16/2023)  Financial Resource Strain: Low Risk  (10/19/2022)  Physical Activity: Inactive (10/19/2022)  Social Connections: Moderately Integrated (03/31/2022)  Stress: No Stress Concern Present (10/19/2022)  Tobacco Use: High Risk (08/09/2023)   Additional Social History:   Current Medications   Current Facility-Administered Medications  Medication Dose Route Frequency Provider Last Rate Last Admin   acetaminophen (TYLENOL) tablet 650 mg  650 mg Oral Q6H PRN White, Patrice L, NP   650 mg at 09/18/23 1120   albuterol (VENTOLIN HFA) 108 (90 Base) MCG/ACT inhaler 2 puff  2 puff Inhalation Q6H PRN White, Patrice L, NP       alum & mag hydroxide-simeth (MAALOX/MYLANTA) 200-200-20 MG/5ML suspension 30 mL  30 mL Oral Q4H PRN White, Patrice L, NP       ARIPiprazole (ABILIFY) tablet 5 mg  5 mg Oral Daily White, Patrice L, NP   5 mg at 09/18/23 0918   atorvastatin (LIPITOR) tablet 80 mg  80 mg Oral Daily White, Patrice L, NP   80 mg at 09/18/23 9811   cloNIDine (CATAPRES) tablet 0.1 mg  0.1 mg Oral BID PRN White, Patrice L, NP       diltiazem (CARDIZEM CD) 24 hr capsule 180 mg  180 mg Oral Daily White, Patrice L, NP   180 mg at 09/18/23 0918   hydrOXYzine (ATARAX) tablet 50 mg  50 mg Oral TID PRN White, Patrice L, NP       latanoprost (XALATAN) 0.005 % ophthalmic solution 1 drop  1 drop Both Eyes QHS Lamar Sprinkles, MD   1 drop at 09/18/23 2116   magnesium hydroxide (MILK OF MAGNESIA) suspension 30 mL  30 mL Oral Daily PRN White, Patrice L, NP   30 mL at 09/18/23 0923   mometasone-formoterol (DULERA) 200-5 MCG/ACT inhaler 2 puff  2 puff Inhalation BID White, Patrice L, NP   2 puff at 09/18/23 2116    multivitamin with minerals tablet 1 tablet  1 tablet Oral Daily  White, Patrice L, NP   1 tablet at 09/18/23 1610   nicotine (NICODERM CQ - dosed in mg/24 hours) patch 21 mg  21 mg Transdermal Q0600 White, Patrice L, NP       thiamine (VITAMIN B1) tablet 100 mg  100 mg Oral Daily White, Patrice L, NP   100 mg at 09/18/23 0918   traZODone (DESYREL) tablet 50 mg  50 mg Oral QHS PRN White, Patrice L, NP   50 mg at 09/18/23 2119   Current Outpatient Medications  Medication Sig Dispense Refill   albuterol (VENTOLIN HFA) 108 (90 Base) MCG/ACT inhaler Inhale 2 puffs into the lungs every 6 (six) hours as needed for shortness of breath. 18 g 1   ARIPiprazole (ABILIFY) 5 MG tablet Take 1 tablet (5 mg total) by mouth daily. 30 tablet 0   atorvastatin (LIPITOR) 80 MG tablet Take 1 tablet (80 mg total) by mouth daily. 30 tablet 0   diltiazem (CARDIZEM CD) 180 MG 24 hr capsule Take 1 capsule (180 mg total) by mouth daily. 30 capsule 0   doxepin (SINEQUAN) 75 MG capsule Take 1 capsule (75 mg total) by mouth at bedtime. 30 capsule 0   hydrOXYzine (ATARAX) 50 MG tablet Take 1 tablet (50 mg total) by mouth 3 (three) times daily as needed for anxiety. 30 tablet 0   latanoprost (XALATAN) 0.005 % ophthalmic solution Place 1 drop into both eyes at bedtime. 2.5 mL 12   mometasone-formoterol (DULERA) 200-5 MCG/ACT AERO Inhale 2 puffs into the lungs 2 (two) times daily. 1 each 1   traZODone (DESYREL) 50 MG tablet Take 1 tablet (50 mg total) by mouth at bedtime as needed for sleep. 30 tablet 0    Labs / Images  Lab Results:  Admission on 09/15/2023, Discharged on 09/16/2023  Component Date Value Ref Range Status   WBC 09/15/2023 8.9  4.0 - 10.5 K/uL Final   RBC 09/15/2023 4.35  4.22 - 5.81 MIL/uL Final   Hemoglobin 09/15/2023 13.0  13.0 - 17.0 g/dL Final   HCT 96/01/5408 40.7  39.0 - 52.0 % Final   MCV 09/15/2023 93.6  80.0 - 100.0 fL Final   MCH 09/15/2023 29.9  26.0 - 34.0 pg Final   MCHC 09/15/2023 31.9  30.0 -  36.0 g/dL Final   RDW 81/19/1478 12.6  11.5 - 15.5 % Final   Platelets 09/15/2023 194  150 - 400 K/uL Final   nRBC 09/15/2023 0.0  0.0 - 0.2 % Final   Neutrophils Relative % 09/15/2023 52  % Final   Neutro Abs 09/15/2023 4.6  1.7 - 7.7 K/uL Final   Lymphocytes Relative 09/15/2023 36  % Final   Lymphs Abs 09/15/2023 3.2  0.7 - 4.0 K/uL Final   Monocytes Relative 09/15/2023 7  % Final   Monocytes Absolute 09/15/2023 0.7  0.1 - 1.0 K/uL Final   Eosinophils Relative 09/15/2023 4  % Final   Eosinophils Absolute 09/15/2023 0.3  0.0 - 0.5 K/uL Final   Basophils Relative 09/15/2023 1  % Final   Basophils Absolute 09/15/2023 0.1  0.0 - 0.1 K/uL Final   Immature Granulocytes 09/15/2023 0  % Final   Abs Immature Granulocytes 09/15/2023 0.03  0.00 - 0.07 K/uL Final   Performed at [facility], [address], [place], [place] [address]   Sodium 09/15/2023 138  135 - 145 mmol/L Final   Potassium 09/15/2023 4.0  3.5 - 5.1 mmol/L Final   Chloride 09/15/2023 105  98 -  111 mmol/L Final   CO2 09/15/2023 24  22 - 32 mmol/L Final   Glucose, Bld 09/15/2023 90  70 - 99 mg/dL Final   Glucose reference range applies only to samples taken after fasting for at least 8 hours.   BUN 09/15/2023 6 (L)  8 - 23 mg/dL Final   Creatinine, Ser 09/15/2023 1.17  0.61 - 1.24 mg/dL Final   Calcium 16/08/9603 8.9  8.9 - 10.3 mg/dL Final   Total Protein 54/07/8118 7.1  6.5 - 8.1 g/dL Final   Albumin 14/78/2956 3.5  3.5 - 5.0 g/dL Final   AST 21/30/8657 19  15 - 41 U/L Final   ALT 09/15/2023 15  0 - 44 U/L Final   Alkaline Phosphatase 09/15/2023 70  38 - 126 U/L Final   Total Bilirubin 09/15/2023 0.8  <1.2 mg/dL Final   GFR, Estimated 09/15/2023 >60  >60 mL/min Final   Comment: (NOTE) Calculated using the CKD-EPI Creatinine Equation (2021)    Anion gap 09/15/2023 9  5 - 15 Final   Performed at [facility], [address], [place], [place] [address]   Hgb A1c MFr Bld 09/15/2023 5.1  4.8 - 5.6 % Final    Comment: (NOTE) Pre diabetes:          5.7%-6.4%  Diabetes:              >6.4%  Glycemic control for   <7.0% adults with diabetes    Mean Plasma Glucose 09/15/2023 99.67  mg/dL Final   Performed at [facility], [address] [place], [place] [address]   Alcohol, Ethyl (B) 09/15/2023 <10  <10 mg/dL Final   Comment: (NOTE) Lowest detectable limit for serum alcohol is 10 mg/dL.  For medical purposes only. Performed at [facility], [address], [place], [place] [address]    Cholesterol 09/15/2023 159  0 - 200 mg/dL Final   Triglycerides 40/08/2724 53  <150 mg/dL Final   HDL 36/64/4034 61  >40 mg/dL Final   Total CHOL/HDL Ratio 09/15/2023 2.6  RATIO Final   VLDL 09/15/2023 11  0 - 40 mg/dL Final   LDL Cholesterol 09/15/2023 87  0 - 99 mg/dL Final   Comment:        Total Cholesterol/HDL:CHD Risk Coronary Heart Disease Risk Table                     Men   Women  1/2 Average Risk   3.4   3.3  Average Risk       5.0   4.4  2 X Average Risk   9.6   7.1  3 X Average Risk  23.4   11.0        Use the calculated Patient Ratio above and the CHD Risk Table to determine the patient's CHD Risk.        ATP III CLASSIFICATION (LDL):  <100     mg/dL   Optimal  742-595  mg/dL   Near or Above                    Optimal  130-159  mg/dL   Borderline  638-756  mg/dL   High  >433     mg/dL   Very High Performed at [facility], [address], [place], [place] [address]    TSH 09/15/2023 3.515  0.350 - 4.500 uIU/mL Final   Comment: Performed by a 3rd Generation assay  with a functional sensitivity of <=0.01 uIU/mL. Performed at [facility], [address] [place], [place] [address]    POC Amphetamine UR 09/15/2023 None Detected  NONE DETECTED (Cut Off Level 1000 ng/mL) Final   POC Secobarbital (BAR) 09/15/2023 None Detected  NONE DETECTED (Cut Off Level 300 ng/mL) Final   POC Buprenorphine (BUP) 09/15/2023 None Detected  NONE DETECTED (Cut Off Level 10 ng/mL)  Final   POC Oxazepam (BZO) 09/15/2023 None Detected  NONE DETECTED (Cut Off Level 300 ng/mL) Final   POC Cocaine UR 09/15/2023 Positive (A)  NONE DETECTED (Cut Off Level 300 ng/mL) Final   POC Methamphetamine UR 09/15/2023 None Detected  NONE DETECTED (Cut Off Level 1000 ng/mL) Final   POC Morphine 09/15/2023 None Detected  NONE DETECTED (Cut Off Level 300 ng/mL) Final   POC Methadone UR 09/15/2023 None Detected  NONE DETECTED (Cut Off Level 300 ng/mL) Final   POC Oxycodone UR 09/15/2023 None Detected  NONE DETECTED (Cut Off Level 100 ng/mL) Final   POC Marijuana UR 09/15/2023 None Detected  NONE DETECTED (Cut Off Level 50 ng/mL) Final  Admission on 08/08/2023, Discharged on 08/22/2023  Component Date Value Ref Range Status   Adenovirus 08/13/2023 NOT DETECTED  NOT DETECTED Final   Coronavirus 229E 08/13/2023 NOT DETECTED  NOT DETECTED Final   Comment: (NOTE) The Coronavirus on the Respiratory Panel, DOES NOT test for the novel  Coronavirus (2019 nCoV)    Coronavirus HKU1 08/13/2023 NOT DETECTED  NOT DETECTED Final   Coronavirus NL63 08/13/2023 NOT DETECTED  NOT DETECTED Final   Coronavirus OC43 08/13/2023 NOT DETECTED  NOT DETECTED Final   Metapneumovirus 08/13/2023 NOT DETECTED  NOT DETECTED Final   Rhinovirus / Enterovirus 08/13/2023 NOT DETECTED  NOT DETECTED Final   Influenza A 08/13/2023 NOT DETECTED  NOT DETECTED Final   Influenza B 08/13/2023 NOT DETECTED  NOT DETECTED Final   Parainfluenza Virus 1 08/13/2023 NOT DETECTED  NOT DETECTED Final   Parainfluenza Virus 2 08/13/2023 NOT DETECTED  NOT DETECTED Final   Parainfluenza Virus 3 08/13/2023 NOT DETECTED  NOT DETECTED Final   Parainfluenza Virus 4 08/13/2023 NOT DETECTED  NOT DETECTED Final   Respiratory Syncytial Virus 08/13/2023 NOT DETECTED  NOT DETECTED Final   Bordetella pertussis 08/13/2023 NOT DETECTED  NOT DETECTED Final   Bordetella Parapertussis 08/13/2023 NOT DETECTED  NOT DETECTED Final   Chlamydophila pneumoniae  08/13/2023 NOT DETECTED  NOT DETECTED Final   Mycoplasma pneumoniae 08/13/2023 NOT DETECTED  NOT DETECTED Final   Performed at [facility], [address] [place], [place] [address]   SARS Coronavirus 2 by RT PCR 08/14/2023 NEGATIVE  NEGATIVE Final   Comment: (NOTE) SARS-CoV-2 target nucleic acids are NOT DETECTED.  The SARS-CoV-2 RNA is generally detectable in upper and lower respiratory specimens during the acute phase of infection. The lowest concentration of SARS-CoV-2 viral copies this assay can detect is 250 copies / mL. A negative result does not preclude SARS-CoV-2 infection and should not be used as the sole basis for treatment or other patient management decisions.  A negative result may occur with improper specimen collection / handling, submission of specimen other than nasopharyngeal swab, presence of viral mutation(s) within the areas targeted by this assay, and inadequate number of viral copies (<250 copies / mL). A negative result must be combined with clinical observations, patient history, and epidemiological information.  Fact Sheet for Patients:   RoadLapTop.co.za  Fact Sheet for Healthcare Providers: http://kim-miller.com/  This test is  not yet approved or                           cleared by the [place] and has been authorized for detection and/or diagnosis of SARS-CoV-2 by FDA under an Emergency Use Authorization (EUA).  This EUA will remain in effect (meaning this test can be used) for the duration of the COVID-19 declaration under Section 564(b)(1) of the Act, 21 U.S.C. section 360bbb-3(b)(1), unless the authorization is terminated or revoked sooner.  Performed at [facility], [address] [place], [place] [address]   Admission on 08/08/2023, Discharged on 08/08/2023  Component Date Value Ref Range Status   Sodium 08/08/2023 137  135 - 145 mmol/L Final   Potassium 08/08/2023  3.9  3.5 - 5.1 mmol/L Final   Chloride 08/08/2023 107  98 - 111 mmol/L Final   CO2 08/08/2023 20 (L)  22 - 32 mmol/L Final   Glucose, Bld 08/08/2023 148 (H)  70 - 99 mg/dL Final   Glucose reference range applies only to samples taken after fasting for at least 8 hours.   BUN 08/08/2023 9  8 - 23 mg/dL Final   Creatinine, Ser 08/08/2023 1.43 (H)  0.61 - 1.24 mg/dL Final   Calcium 60/45/4098 8.5 (L)  8.9 - 10.3 mg/dL Final   Total Protein 11/91/4782 7.1  6.5 - 8.1 g/dL Final   Albumin 95/62/1308 3.4 (L)  3.5 - 5.0 g/dL Final   AST 65/78/4696 25  15 - 41 U/L Final   ALT 08/08/2023 18  0 - 44 U/L Final   Alkaline Phosphatase 08/08/2023 76  38 - 126 U/L Final   Total Bilirubin 08/08/2023 0.7  0.3 - 1.2 mg/dL Final   GFR, Estimated 08/08/2023 53 (L)  >60 mL/min Final   Comment: (NOTE) Calculated using the CKD-EPI Creatinine Equation (2021)    Anion gap 08/08/2023 10  5 - 15 Final   Performed at [facility], [address] [place], [place] [address]   Alcohol, Ethyl (B) 08/08/2023 <10  <10 mg/dL Final   Comment: (NOTE) Lowest detectable limit for serum alcohol is 10 mg/dL.  For medical purposes only. Performed at [facility], [address] [place], [place] [address]    Salicylate Lvl 08/08/2023 <7.0 (L)  7.0 - 30.0 mg/dL Final   Performed at [facility], [address], [place], [place] [address]   Acetaminophen (Tylenol), Serum 08/08/2023 <10 (L)  10 - 30 ug/mL Final   Comment: (NOTE) Therapeutic concentrations vary significantly. A range of 10-30 ug/mL  may be an effective concentration for many patients. However, some  are best treated at concentrations outside of this range. Acetaminophen concentrations >150 ug/mL at 4 hours after ingestion  and >50 ug/mL at 12 hours after ingestion are often associated with  toxic reactions.  Performed at [facility], [address], [place], [place] [address]    WBC 08/08/2023 9.9  4.0 - 10.5 K/uL Final   RBC  08/08/2023 4.58  4.22 - 5.81 MIL/uL Final   Hemoglobin 08/08/2023 14.0  13.0 - 17.0 g/dL Final   HCT 64/40/3474 44.0  39.0 - 52.0 % Final   MCV 08/08/2023 96.1  80.0 - 100.0 fL Final   MCH 08/08/2023 30.6  26.0 - 34.0 pg Final   MCHC 08/08/2023 31.8  30.0 - 36.0 g/dL Final   RDW 25/95/6387 12.6  11.5 - 15.5 % Final  Platelets 08/08/2023 249  150 - 400 K/uL Final   nRBC 08/08/2023 0.0  0.0 - 0.2 % Final   Performed at [facility], [address] [place], [place] [address]   Opiates 08/08/2023 NONE DETECTED  NONE DETECTED Final   Cocaine 08/08/2023 POSITIVE (A)  NONE DETECTED Final   Benzodiazepines 08/08/2023 NONE DETECTED  NONE DETECTED Final   Amphetamines 08/08/2023 NONE DETECTED  NONE DETECTED Final   Tetrahydrocannabinol 08/08/2023 NONE DETECTED  NONE DETECTED Final   Barbiturates 08/08/2023 NONE DETECTED  NONE DETECTED Final   Comment: (NOTE) DRUG SCREEN FOR MEDICAL PURPOSES ONLY.  IF CONFIRMATION IS NEEDED FOR ANY PURPOSE, NOTIFY LAB WITHIN 5 DAYS.  LOWEST DETECTABLE LIMITS FOR URINE DRUG SCREEN Drug Class                     Cutoff (ng/mL) Amphetamine and metabolites    1000 Barbiturate and metabolites    200 Benzodiazepine                 200 Opiates and metabolites        300 Cocaine and metabolites        300 THC                            50 Performed at [facility], [address], [place], [place] [address]    SARS Coronavirus 2 by RT PCR 08/08/2023 NEGATIVE  NEGATIVE Final   Performed at [facility], [address] [place], [place] [address]  Admission on 07/15/2023, Discharged on 07/17/2023  Component Date Value Ref Range Status   Sodium 07/15/2023 135  135 - 145 mmol/L Final   Potassium 07/15/2023 3.7  3.5 - 5.1 mmol/L Final   Chloride 07/15/2023 107  98 - 111 mmol/L Final   CO2 07/15/2023 19 (L)  22 - 32 mmol/L Final   Glucose, Bld 07/15/2023 129 (H)  70 - 99 mg/dL Final   Glucose reference range applies only to samples taken after  fasting for at least 8 hours.   BUN 07/15/2023 11  8 - 23 mg/dL Final   Creatinine, Ser 07/15/2023 1.39 (H)  0.61 - 1.24 mg/dL Final   Calcium 11/03/7251 8.4 (L)  8.9 - 10.3 mg/dL Final   Total Protein 66/44/0347 6.9  6.5 - 8.1 g/dL Final   Albumin 42/59/5638 3.6  3.5 - 5.0 g/dL Final   AST 75/64/3329 21  15 - 41 U/L Final   ALT 07/15/2023 17  0 - 44 U/L Final   Alkaline Phosphatase 07/15/2023 64  38 - 126 U/L Final   Total Bilirubin 07/15/2023 0.5  0.3 - 1.2 mg/dL Final   GFR, Estimated 07/15/2023 55 (L)  >60 mL/min Final   Comment: (NOTE) Calculated using the CKD-EPI Creatinine Equation (2021)    Anion gap 07/15/2023 9  5 - 15 Final   Performed at [facility], [address] [place], [place] [address]   Alcohol, Ethyl (B) 07/15/2023 <10  <10 mg/dL Final   Comment: (NOTE) Lowest detectable limit for serum alcohol is 10 mg/dL.  For medical purposes only. Performed at [facility], [address], [place], [place] [address]    Salicylate Lvl 07/15/2023 <7.0 (L)  7.0 - 30.0 mg/dL Final   Performed at [facility], [address], [place], [place] [address]   Acetaminophen (Tylenol), Serum 07/15/2023 <10 (L)  10 -  30 ug/mL Final   Comment: (NOTE) Therapeutic concentrations vary significantly. A range of 10-30 ug/mL  may be an effective concentration for many patients. However, some  are best treated at concentrations outside of this range. Acetaminophen concentrations >150 ug/mL at 4 hours after ingestion  and >50 ug/mL at 12 hours after ingestion are often associated with  toxic reactions.  Performed at [facility], [address], [place], [place] [address]    WBC 07/15/2023 9.1  4.0 - 10.5 K/uL Final   RBC 07/15/2023 4.20 (L)  4.22 - 5.81 MIL/uL Final   Hemoglobin 07/15/2023 13.0  13.0 - 17.0 g/dL Final   HCT 60/45/4098 40.5  39.0 - 52.0 % Final   MCV 07/15/2023 96.4  80.0 - 100.0 fL Final   MCH 07/15/2023 31.0  26.0 - 34.0 pg Final   MCHC  07/15/2023 32.1  30.0 - 36.0 g/dL Final   RDW 11/91/4782 12.6  11.5 - 15.5 % Final   Platelets 07/15/2023 267  150 - 400 K/uL Final   nRBC 07/15/2023 0.0  0.0 - 0.2 % Final   Performed at [facility], [address] [place], [place] [address]   Opiates 07/15/2023 NONE DETECTED  NONE DETECTED Final   Cocaine 07/15/2023 POSITIVE (A)  NONE DETECTED Final   Benzodiazepines 07/15/2023 NONE DETECTED  NONE DETECTED Final   Amphetamines 07/15/2023 NONE DETECTED  NONE DETECTED Final   Tetrahydrocannabinol 07/15/2023 POSITIVE (A)  NONE DETECTED Final   Barbiturates 07/15/2023 NONE DETECTED  NONE DETECTED Final   Comment: (NOTE) DRUG SCREEN FOR MEDICAL PURPOSES ONLY.  IF CONFIRMATION IS NEEDED FOR ANY PURPOSE, NOTIFY LAB WITHIN 5 DAYS.  LOWEST DETECTABLE LIMITS FOR URINE DRUG SCREEN Drug Class                     Cutoff (ng/mL) Amphetamine and metabolites    1000 Barbiturate and metabolites    200 Benzodiazepine                 200 Opiates and metabolites        300 Cocaine and metabolites        300 THC                            50 Performed at [facility], [address] [place], [place] [address]    Color, Urine 07/15/2023 YELLOW  YELLOW Final   APPearance 07/15/2023 CLEAR  CLEAR Final   Specific Gravity, Urine 07/15/2023 >1.030 (H)  1.005 - 1.030 Final   pH 07/15/2023 6.0  5.0 - 8.0 Final   Glucose, UA 07/15/2023 NEGATIVE  NEGATIVE mg/dL Final   Hgb urine dipstick 07/15/2023 NEGATIVE  NEGATIVE Final   Bilirubin Urine 07/15/2023 NEGATIVE  NEGATIVE Final   Ketones, ur 07/15/2023 NEGATIVE  NEGATIVE mg/dL Final   Protein, ur 78/46/9629 NEGATIVE  NEGATIVE mg/dL Final   Nitrite 52/84/1324 NEGATIVE  NEGATIVE Final   Leukocytes,Ua 07/15/2023 NEGATIVE  NEGATIVE Final   Comment: Microscopic not done on urines with negative protein, blood, leukocytes, nitrite, or glucose < 500 mg/dL. Performed at [facility], [address] [place], [place] [address]    SARS  Coronavirus 2 by RT PCR 07/16/2023 NEGATIVE  NEGATIVE Final   Performed at [facility], [address] [place], [place] [address]  Admission on 07/12/2023, Discharged on 07/12/2023  Component Date Value Ref Range Status   Troponin  I (High Sensitivity) 07/12/2023 7  <18 ng/L Final   Comment: (NOTE) Elevated high sensitivity troponin I (hsTnI) values and significant  changes across serial measurements may suggest ACS but many other  chronic and acute conditions are known to elevate hsTnI results.  Refer to the "Links" section for chest pain algorithms and additional  guidance. Performed at [facility], [address] [place], [place] [address]    Sodium 07/12/2023 136  135 - 145 mmol/L Final   Potassium 07/12/2023 4.1  3.5 - 5.1 mmol/L Final   Chloride 07/12/2023 106  98 - 111 mmol/L Final   CO2 07/12/2023 19 (L)  22 - 32 mmol/L Final   Glucose, Bld 07/12/2023 117 (H)  70 - 99 mg/dL Final   Glucose reference range applies only to samples taken after fasting for at least 8 hours.   BUN 07/12/2023 10  8 - 23 mg/dL Final   Creatinine, Ser 07/12/2023 1.17  0.61 - 1.24 mg/dL Final   Calcium 74/25/9563 8.2 (L)  8.9 - 10.3 mg/dL Final   Total Protein 87/56/4332 6.6  6.5 - 8.1 g/dL Final   Albumin 95/18/8416 3.3 (L)  3.5 - 5.0 g/dL Final   AST 60/63/0160 22  15 - 41 U/L Final   ALT 07/12/2023 16  0 - 44 U/L Final   Alkaline Phosphatase 07/12/2023 62  38 - 126 U/L Final   Total Bilirubin 07/12/2023 0.6  0.3 - 1.2 mg/dL Final   GFR, Estimated 07/12/2023 >60  >60 mL/min Final   Comment: (NOTE) Calculated using the CKD-EPI Creatinine Equation (2021)    Anion gap 07/12/2023 11  5 - 15 Final   Performed at [facility], [address], [place], [place] [address]   Alcohol, Ethyl (B) 07/12/2023 <10  <10 mg/dL Final   Comment: (NOTE) Lowest detectable limit for serum alcohol is 10 mg/dL.  For medical purposes only. Performed at [facility], [address],  [place], [place] [address]    Salicylate Lvl 07/12/2023 <7.0 (L)  7.0 - 30.0 mg/dL Final   Performed at [facility], [address], [place], [place] [address]   Acetaminophen (Tylenol), Serum 07/12/2023 <10 (L)  10 - 30 ug/mL Final   Comment: (NOTE) Therapeutic concentrations vary significantly. A range of 10-30 ug/mL  may be an effective concentration for many patients. However, some  are best treated at concentrations outside of this range. Acetaminophen concentrations >150 ug/mL at 4 hours after ingestion  and >50 ug/mL at 12 hours after ingestion are often associated with  toxic reactions.  Performed at [facility], [address], [place], [place] [address]    WBC 07/12/2023 7.9  4.0 - 10.5 K/uL Final   RBC 07/12/2023 4.43  4.22 - 5.81 MIL/uL Final   Hemoglobin 07/12/2023 13.3  13.0 - 17.0 g/dL Final   HCT 23/76/2831 41.7  39.0 - 52.0 % Final   MCV 07/12/2023 94.1  80.0 - 100.0 fL Final   MCH 07/12/2023 30.0  26.0 - 34.0 pg Final   MCHC 07/12/2023 31.9  30.0 - 36.0 g/dL Final   RDW 51/76/1607 12.5  11.5 - 15.5 % Final   Platelets 07/12/2023 255  150 - 400 K/uL Final   nRBC 07/12/2023 0.0  0.0 - 0.2 % Final   Performed at [facility], [address], [place], [place] [address]   Opiates 07/12/2023 NONE DETECTED  NONE DETECTED Final   Cocaine 07/12/2023 POSITIVE (  A)  NONE DETECTED Final   Benzodiazepines 07/12/2023 NONE DETECTED  NONE DETECTED Final   Amphetamines 07/12/2023 NONE DETECTED  NONE DETECTED Final   Tetrahydrocannabinol 07/12/2023 POSITIVE (A)  NONE DETECTED Final   Barbiturates 07/12/2023 NONE DETECTED  NONE DETECTED Final   Comment: (NOTE) DRUG SCREEN FOR MEDICAL PURPOSES ONLY.  IF CONFIRMATION IS NEEDED FOR ANY PURPOSE, NOTIFY LAB WITHIN 5 DAYS.  LOWEST DETECTABLE LIMITS FOR URINE DRUG SCREEN Drug Class                     Cutoff (ng/mL) Amphetamine and metabolites    1000 Barbiturate and metabolites    200 Benzodiazepine                  200 Opiates and metabolites        300 Cocaine and metabolites        300 THC                            50 Performed at [facility], [address] [place], [place] [address]    Troponin I (High Sensitivity) 07/12/2023 7  <18 ng/L Final   Comment: (NOTE) Elevated high sensitivity troponin I (hsTnI) values and significant  changes across serial measurements may suggest ACS but many other  chronic and acute conditions are known to elevate hsTnI results.  Refer to the "Links" section for chest pain algorithms and additional  guidance. Performed at [facility], [address], [place], [place] [address]   Admission on 06/01/2023, Discharged on 06/01/2023  Component Date Value Ref Range Status   Fecal Occult Bld 06/01/2023 POSITIVE (A)  NEGATIVE Final   Sodium 06/01/2023 135  135 - 145 mmol/L Final   Potassium 06/01/2023 4.0  3.5 - 5.1 mmol/L Final   Chloride 06/01/2023 102  98 - 111 mmol/L Final   CO2 06/01/2023 22  22 - 32 mmol/L Final   Glucose, Bld 06/01/2023 100 (H)  70 - 99 mg/dL Final   Glucose reference range applies only to samples taken after fasting for at least 8 hours.   BUN 06/01/2023 13  8 - 23 mg/dL Final   Creatinine, Ser 06/01/2023 1.35 (H)  0.61 - 1.24 mg/dL Final   Calcium 09/81/1914 8.6 (L)  8.9 - 10.3 mg/dL Final   Total Protein 78/29/5621 6.8  6.5 - 8.1 g/dL Final   Albumin 30/86/5784 3.5  3.5 - 5.0 g/dL Final   AST 69/62/9528 26  15 - 41 U/L Final   ALT 06/01/2023 18  0 - 44 U/L Final   Alkaline Phosphatase 06/01/2023 65  38 - 126 U/L Final   Total Bilirubin 06/01/2023 0.8  0.3 - 1.2 mg/dL Final   GFR, Estimated 06/01/2023 57 (L)  >60 mL/min Final   Comment: (NOTE) Calculated using the CKD-EPI Creatinine Equation (2021)    Anion gap 06/01/2023 11  5 - 15 Final   Performed at [facility], [address] [place], [place] [address]   WBC 06/01/2023 11.7 (H)  4.0 - 10.5 K/uL Final   RBC 06/01/2023 3.94 (L)  4.22 - 5.81 MIL/uL Final    Hemoglobin 06/01/2023 12.2 (L)  13.0 - 17.0 g/dL Final   HCT 40/08/2724 37.8 (L)  39.0 - 52.0 % Final   MCV 06/01/2023 95.9  80.0 - 100.0 fL Final   MCH 06/01/2023 31.0  26.0 - 34.0 pg Final   MCHC  06/01/2023 32.3  30.0 - 36.0 g/dL Final   RDW 60/45/4098 12.4  11.5 - 15.5 % Final   Platelets 06/01/2023 238  150 - 400 K/uL Final   nRBC 06/01/2023 0.0  0.0 - 0.2 % Final   Neutrophils Relative % 06/01/2023 75  % Final   Neutro Abs 06/01/2023 8.7 (H)  1.7 - 7.7 K/uL Final   Lymphocytes Relative 06/01/2023 16  % Final   Lymphs Abs 06/01/2023 1.9  0.7 - 4.0 K/uL Final   Monocytes Relative 06/01/2023 7  % Final   Monocytes Absolute 06/01/2023 0.9  0.1 - 1.0 K/uL Final   Eosinophils Relative 06/01/2023 0  % Final   Eosinophils Absolute 06/01/2023 0.0  0.0 - 0.5 K/uL Final   Basophils Relative 06/01/2023 1  % Final   Basophils Absolute 06/01/2023 0.1  0.0 - 0.1 K/uL Final   Immature Granulocytes 06/01/2023 1  % Final   Abs Immature Granulocytes 06/01/2023 0.10 (H)  0.00 - 0.07 K/uL Final   Performed at [facility], [address], [place], [place] [address]   Prothrombin Time 06/01/2023 13.7  11.4 - 15.2 seconds Final   INR 06/01/2023 1.0  0.8 - 1.2 Final   Comment: (NOTE) INR goal varies based on device and disease states. Performed at [facility], [address] [place], [place] [address]    ABO/RH(D) 06/01/2023 O POS   Final   Antibody Screen 06/01/2023 NEG   Final   Sample Expiration 06/01/2023    Final                   Value:06/04/2023,2359 Performed at [facility], [address], [place], [place] [address]    ABO/RH(D) 06/01/2023    Final                   Value:O POS Performed at [facility], [address] [place], [place] [address]   Admission on 05/24/2023, Discharged on 05/25/2023  Component Date Value Ref Range Status   Sodium 05/24/2023 139  135 - 145 mmol/L Final   Potassium 05/24/2023 3.5  3.5 - 5.1 mmol/L Final   Chloride 05/24/2023 105   98 - 111 mmol/L Final   CO2 05/24/2023 21 (L)  22 - 32 mmol/L Final   Glucose, Bld 05/24/2023 117 (H)  70 - 99 mg/dL Final   Glucose reference range applies only to samples taken after fasting for at least 8 hours.   BUN 05/24/2023 7 (L)  8 - 23 mg/dL Final   Creatinine, Ser 05/24/2023 1.10  0.61 - 1.24 mg/dL Final   Calcium 46/96/2952 8.7 (L)  8.9 - 10.3 mg/dL Final   Total Protein 84/13/2440 6.9  6.5 - 8.1 g/dL Final   Albumin 08/28/2535 3.6  3.5 - 5.0 g/dL Final   AST 64/40/3474 19  15 - 41 U/L Final   ALT 05/24/2023 14  0 - 44 U/L Final   Alkaline Phosphatase 05/24/2023 62  38 - 126 U/L Final   Total Bilirubin 05/24/2023 0.7  0.3 - 1.2 mg/dL Final   GFR, Estimated 05/24/2023 >60  >60 mL/min Final   Comment: (NOTE) Calculated using the CKD-EPI Creatinine Equation (2021)    Anion gap 05/24/2023 13  5 - 15 Final   Performed at [facility], [address] [place], [place] [address]   Alcohol, Ethyl (B) 05/24/2023 18 (H)  <10 mg/dL Final   Comment: (NOTE) Lowest detectable limit for  serum alcohol is 10 mg/dL.  For medical purposes only. Performed at [facility], [address], [place], [place] [address]    Salicylate Lvl 05/24/2023 <7.0 (L)  7.0 - 30.0 mg/dL Final   Performed at [facility], [address] [place], [place] [address]   Acetaminophen (Tylenol), Serum 05/24/2023 <10 (L)  10 - 30 ug/mL Final   Comment: (NOTE) Therapeutic concentrations vary significantly. A range of 10-30 ug/mL  may be an effective concentration for many patients. However, some  are best treated at concentrations outside of this range. Acetaminophen concentrations >150 ug/mL at 4 hours after ingestion  and >50 ug/mL at 12 hours after ingestion are often associated with  toxic reactions.  Performed at [facility], [address] [place], [place] [address]    WBC 05/24/2023 7.7  4.0 - 10.5 K/uL Final   RBC 05/24/2023 4.01 (L)  4.22 - 5.81 MIL/uL Final   Hemoglobin  05/24/2023 12.5 (L)  13.0 - 17.0 g/dL Final   HCT 88/41/6606 38.3 (L)  39.0 - 52.0 % Final   MCV 05/24/2023 95.5  80.0 - 100.0 fL Final   MCH 05/24/2023 31.2  26.0 - 34.0 pg Final   MCHC 05/24/2023 32.6  30.0 - 36.0 g/dL Final   RDW 30/16/0109 12.7  11.5 - 15.5 % Final   Platelets 05/24/2023 255  150 - 400 K/uL Final   nRBC 05/24/2023 0.0  0.0 - 0.2 % Final   Performed at [facility], [address], [place], [place] [address]   Opiates 05/24/2023 NONE DETECTED  NONE DETECTED Final   Cocaine 05/24/2023 POSITIVE (A)  NONE DETECTED Final   Benzodiazepines 05/24/2023 NONE DETECTED  NONE DETECTED Final   Amphetamines 05/24/2023 NONE DETECTED  NONE DETECTED Final   Tetrahydrocannabinol 05/24/2023 NONE DETECTED  NONE DETECTED Final   Barbiturates 05/24/2023 NONE DETECTED  NONE DETECTED Final   Comment: (NOTE) DRUG SCREEN FOR MEDICAL PURPOSES ONLY.  IF CONFIRMATION IS NEEDED FOR ANY PURPOSE, NOTIFY LAB WITHIN 5 DAYS.  LOWEST DETECTABLE LIMITS FOR URINE DRUG SCREEN Drug Class                     Cutoff (ng/mL) Amphetamine and metabolites    1000 Barbiturate and metabolites    200 Benzodiazepine                 200 Opiates and metabolites        300 Cocaine and metabolites        300 THC                            50 Performed at [facility], [address] [place], [place], [place] [address]    Troponin I (High Sensitivity) 05/24/2023 14  <18 ng/L Final   Comment: (NOTE) Elevated high sensitivity troponin I (hsTnI) values and significant  changes across serial measurements may suggest ACS but many other  chronic and acute conditions are known to elevate hsTnI results.  Refer to the "Links" section for chest pain algorithms and additional  guidance. Performed at [facility], [address], [place], [place] [address]    Troponin I (High Sensitivity) 05/24/2023 13  <18 ng/L Final   Comment: (NOTE) Elevated high sensitivity troponin I (hsTnI) values and significant   changes across serial measurements may suggest ACS but many other  chronic and acute conditions are known  to elevate hsTnI results.  Refer to the "Links" section for chest pain algorithms and additional  guidance. Performed at [facility], [address] [place], [place] [address]   Admission on 04/05/2023, Discharged on 04/06/2023  Component Date Value Ref Range Status   Acetaminophen (Tylenol), Serum 04/05/2023 <10 (L)  10 - 30 ug/mL Final   Comment: (NOTE) Therapeutic concentrations vary significantly. A range of 10-30 ug/mL  may be an effective concentration for many patients. However, some  are best treated at concentrations outside of this range. Acetaminophen concentrations >150 ug/mL at 4 hours after ingestion  and >50 ug/mL at 12 hours after ingestion are often associated with  toxic reactions.  Performed at [facility], [address] [place], [place] [address]    WBC 04/05/2023 11.2 (H)  4.0 - 10.5 K/uL Final   RBC 04/05/2023 4.39  4.22 - 5.81 MIL/uL Final   Hemoglobin 04/05/2023 13.3  13.0 - 17.0 g/dL Final   HCT 57/84/6962 42.6  39.0 - 52.0 % Final   MCV 04/05/2023 97.0  80.0 - 100.0 fL Final   MCH 04/05/2023 30.3  26.0 - 34.0 pg Final   MCHC 04/05/2023 31.2  30.0 - 36.0 g/dL Final   RDW 95/28/4132 13.0  11.5 - 15.5 % Final   Platelets 04/05/2023 276  150 - 400 K/uL Final   nRBC 04/05/2023 0.0  0.0 - 0.2 % Final   Neutrophils Relative % 04/05/2023 61  % Final   Neutro Abs 04/05/2023 6.9  1.7 - 7.7 K/uL Final   Lymphocytes Relative 04/05/2023 26  % Final   Lymphs Abs 04/05/2023 2.9  0.7 - 4.0 K/uL Final   Monocytes Relative 04/05/2023 9  % Final   Monocytes Absolute 04/05/2023 1.0  0.1 - 1.0 K/uL Final   Eosinophils Relative 04/05/2023 3  % Final   Eosinophils Absolute 04/05/2023 0.3  0.0 - 0.5 K/uL Final   Basophils Relative 04/05/2023 1  % Final   Basophils Absolute 04/05/2023 0.1  0.0 - 0.1 K/uL Final   Immature Granulocytes 04/05/2023 0  %  Final   Abs Immature Granulocytes 04/05/2023 0.03  0.00 - 0.07 K/uL Final   Performed at [facility], [address] [place], [place] [address]   Sodium 04/05/2023 140  135 - 145 mmol/L Final   Potassium 04/05/2023 4.1  3.5 - 5.1 mmol/L Final   Chloride 04/05/2023 106  98 - 111 mmol/L Final   CO2 04/05/2023 25  22 - 32 mmol/L Final   Glucose, Bld 04/05/2023 104 (H)  70 - 99 mg/dL Final   Glucose reference range applies only to samples taken after fasting for at least 8 hours.   BUN 04/05/2023 15  8 - 23 mg/dL Final   Creatinine, Ser 04/05/2023 1.59 (H)  0.61 - 1.24 mg/dL Final   Calcium 27/25/3664 9.0  8.9 - 10.3 mg/dL Final   Total Protein 40/34/7425 7.2  6.5 - 8.1 g/dL Final   Albumin 95/63/8756 3.5  3.5 - 5.0 g/dL Final   AST 43/32/9518 22  15 - 41 U/L Final   ALT 04/05/2023 14  0 - 44 U/L Final   Alkaline Phosphatase 04/05/2023 83  38 - 126 U/L Final   Total Bilirubin 04/05/2023 0.6  0.3 - 1.2 mg/dL Final   GFR, Estimated 04/05/2023 47 (L)  >60 mL/min Final   Comment: (NOTE) Calculated using the CKD-EPI Creatinine Equation (2021)    Anion gap 04/05/2023 9  5 - 15  Final   Performed at [facility], [address], [place], [place] [address]   Alcohol, Ethyl (B) 04/05/2023 <10  <10 mg/dL Final   Comment: (NOTE) Lowest detectable limit for serum alcohol is 10 mg/dL.  For medical purposes only. Performed at [facility], [address], [place], [place] [address]    Opiates 04/05/2023 NONE DETECTED  NONE DETECTED Final   Cocaine 04/05/2023 POSITIVE (A)  NONE DETECTED Final   Benzodiazepines 04/05/2023 NONE DETECTED  NONE DETECTED Final   Amphetamines 04/05/2023 NONE DETECTED  NONE DETECTED Final   Tetrahydrocannabinol 04/05/2023 NONE DETECTED  NONE DETECTED Final   Barbiturates 04/05/2023 NONE DETECTED  NONE DETECTED Final   Comment: (NOTE) DRUG SCREEN FOR MEDICAL PURPOSES ONLY.  IF CONFIRMATION IS NEEDED FOR ANY PURPOSE, NOTIFY LAB WITHIN 5  DAYS.  LOWEST DETECTABLE LIMITS FOR URINE DRUG SCREEN Drug Class                     Cutoff (ng/mL) Amphetamine and metabolites    1000 Barbiturate and metabolites    200 Benzodiazepine                 200 Opiates and metabolites        300 Cocaine and metabolites        300 THC                            50 Performed at [facility], [address], [place], [place] [address]    Salicylate Lvl 04/05/2023 <7.0 (L)  7.0 - 30.0 mg/dL Final   Performed at [facility], [address], [place], [place] [address]   Troponin I (High Sensitivity) 04/05/2023 12  <18 ng/L Final   Comment: (NOTE) Elevated high sensitivity troponin I (hsTnI) values and significant  changes across serial measurements may suggest ACS but many other  chronic and acute conditions are known to elevate hsTnI results.  Refer to the "Links" section for chest pain algorithms and additional  guidance. Performed at [facility], [address], [place], [place] [address]    Troponin I (High Sensitivity) 04/05/2023 12  <18 ng/L Final   Comment: (NOTE) Elevated high sensitivity troponin I (hsTnI) values and significant  changes across serial measurements may suggest ACS but many other  chronic and acute conditions are known to elevate hsTnI results.  Refer to the "Links" section for chest pain algorithms and additional  guidance. Performed at [facility], [address], [place], [place] [address]    SARS Coronavirus 2 by RT PCR 04/05/2023 NEGATIVE  NEGATIVE Final   Performed at [facility], [address] [place], [place] [address]  Congregational Nurse Program on 03/22/2023  Component Date Value Ref Range Status   POC Glucose 03/22/2023 140 (A)  70 - 99 mg/dl Final   Blood Alcohol level:  Lab Results  Component Value Date   ETH <10 09/15/2023   ETH <10 08/08/2023   Metabolic Disorder Labs: Lab Results  Component Value Date   HGBA1C 5.1 09/15/2023   MPG 99.67 09/15/2023    MPG 103 01/13/2023   No results found for: "PROLACTIN" Lab Results  Component Value Date   CHOL 159 09/15/2023   TRIG 53 09/15/2023   HDL 61 09/15/2023   CHOLHDL 2.6 09/15/2023   VLDL 11 09/15/2023   LDLCALC 87 09/15/2023   LDLCALC 99  01/13/2023   Therapeutic Lab Levels: No results found for: "LITHIUM" No results found for: "VALPROATE" No results found for: "CBMZ" Physical Findings   AIMS    Flowsheet Row [facility]) [facility]) [facility]  AIMS Total Score 0 0      AUDIT    Flowsheet Row Admission (Discharged) from 08/08/2023 in [facility] Admission (Discharged) from 04/06/2023 in [facility] Admission (Discharged) from 01/10/2023 in [facility] Admission (Discharged) from 08/11/2022 in [facility] Admission (Discharged) [facility]  Alcohol Use Disorder Identification Test Final Score (AUDIT) 29 0 13 0 7      CAGE-AID    Flowsheet Row ED to Hosp-Admission (Discharged) from 06/04/2020 in [facility]  CAGE-AID Score 3      GAD-7    Flowsheet Row Office Visit from 01/11/2022 in [facility] [place] - A Dept Of [place]. [facility] Office Visit from 09/28/2021 in [facility] [place] - A Dept Of Eligha Bridegroom. [facility] Office Visit from 02/26/2021 in [facility] Office Visit from 12/25/2020 in [facility] 1 Integrated Behavioral Health from 07/16/2020 in [facility] [place] - A Dept Of [place]. [facility]  Total GAD-7 Score 7 10 11 7 14       PHQ2-9    Flowsheet Row ED from 09/16/2023 in [facility] Clinical Support from 10/19/2022 in [facility] - [facility]  Dept Of [place]. [facility] Office Visit from 05/11/2022 in [facility] [place] - A Dept Of Eligha Bridegroom. [facility] Patient Outreach Telephone from 03/31/2022 in [facility] Community Care Coordination Patient Outreach Telephone from 01/14/2022 in [facility] Community Care Coordination  PHQ-2 Total Score 1 0 2 0 1  PHQ-9 Total Score -- -- 5 -- --      Flowsheet Row ED from 09/16/2023 in [facility] ED from 09/15/2023 in [facility] Admission (Discharged) from 08/08/2023 in [facility] BEHAVIORAL MEDICINE  C-SSRS RISK CATEGORY No Risk No Risk High Risk       Musculoskeletal  Strength & Muscle Tone: within normal limits Gait & Station: normal Patient leans: N/A   Strength & Muscle Tone: within normal limits Gait & Station: unsteady, mildly unsteady Patient leans: Front  Psychiatric Specialty Exam   Presentation  General Appearance:Appropriate for Environment Eye Contact:Good Speech:Blocked Volume:Normal Handedness:   Mood and Affect  Mood:Angry Affect:Constricted  Thought Process  Thought Process:Coherent Descriptions of Associations:Intact  Thought Content Suicidal Thoughts:Suicidal Thoughts: No Homicidal Thoughts:Homicidal Thoughts: No Hallucinations:Hallucinations: None Ideas of Reference:None Thought Content:Logical  Sensorium  Memory:Immediate Good Judgment:Fair Insight:Fair  Executive Functions  Orientation:Full (Time, Place and Person) Language:Good Concentration:Good Attention:Good Recall:Good Fund of Knowledge:Good  Psychomotor Activity  Psychomotor Activity:   Assets  Assets:   Sleep  Quality:Good  Physical Exam  BP 124/61 (BP Location: Right Arm)   Pulse 63   Temp 98.3 F (36.8 C) (Oral)   Resp 18   SpO2 98%  Physical Exam Vitals reviewed.  Constitutional:      General: He is not in acute distress.    Appearance: He  is not toxic-appearing.  HENT:     Head: Normocephalic and atraumatic.  Pulmonary:  Effort: Pulmonary effort is normal.  Skin:    General: Skin is warm and dry.  Neurological:     Mental Status: He is alert.     Gait: Gait abnormal.      Assessment / Plan  Total Time spent with patient: 30 minutes Treatment Plan Summary: Daily contact with patient to assess and evaluate symptoms and progress in treatment and Medication management  Principal Problem:   Alcohol use with alcohol-induced mood disorder [facility])   Mr. Martell Lindell is a 67 year old male with a past psychiatric history history of multiple inpatient hospitalizations (x3 2024, most recent in October), major depressive disorder, cocaine use disorder, alcohol use disorder and past medical history of COPD, hypertension, NSTEMI, CAD who presented to the behavioral health urgent care unaccompanied requesting detox from alcohol and cocaine. Patient continues to deny depressive symptoms. Goal is to help him get through this detox period. While inpatient rehab is recommended for him, he requests intensive outpatient services.  He plans to pursue residential substance use treatment after the holidays.   Labs notable for Cr 1.17, AST 19, ALT 15. Lipid panel wnl. Alc <10. UDS+cocaine. QTc 437.    #Alcohol use disorder #Cocaine use disorder  -Previously discontinued ativan taper -Continue CIWA monitoring: 0 over the past 24 hours; if remains less than 4, we will discontinue tomorrow. -PRN ativan for CIWA >10  -PRN: atarax, tylenol, maalox, imodium, milk of mg, zofran -daily MV, thiamine  -recommend abstinence from cocaine    #History of MDD #Alcohol-Induced Mood Disorder Has been noncompliant with medications -restart home abilify 5    #HTN -Continue cardizem 180 daily   #HLD -Continue lipitor 80   #COPD -Continue dulera, PRN albuterol    #Tobacco use disorder -declines NRT  #Cataracts -Reached out to pharmacy to  discuss alternative to Lantaprost ophthalmic solution.;  Awaiting answer and will put in order upon response.   Dispo: Patient set up with [facility] appointment on Wednesday 1pm   Signed: Harlin Heys, DO 09/19/2023, 8:32 AM   [facility] [address] [place], [place] [address] Dept: [phone] Dept Fax: [phone]

## 2023-09-20 DIAGNOSIS — F1094 Alcohol use, unspecified with alcohol-induced mood disorder: Secondary | ICD-10-CM | POA: Diagnosis not present

## 2023-09-20 DIAGNOSIS — F1014 Alcohol abuse with alcohol-induced mood disorder: Secondary | ICD-10-CM | POA: Diagnosis not present

## 2023-09-20 NOTE — ED Provider Notes (Signed)
[facility] Progress Note  Date & Time: 09/20/2023 11:48 AM Name: Robert Chan Age: 67 y.o.  DOB: 07-30-1956  MRN: [id number]  Diagnosis:  Final diagnoses:  Alcohol-induced mood disorder (HCC)  COPD with chronic bronchitis (HCC)  Moderate episode of recurrent major depressive disorder (HCC)  PAD (peripheral artery disease) (HCC)  Essential hypertension  Cocaine use disorder (HCC)  Tobacco use disorder   Reason for presentation: detox from alcohol and cocaine  Brief HPI  Robert Chan is a 67 y.o. male, with past psychiatric history history of multiple inpatient hospitalizations (x3 2024, most recent in October), major depressive disorder, cocaine use disorder, alcohol use disorder and past medical history of COPD, hypertension, NSTEMI, CAD, who presented Voluntary to [facility] (09/20/2023) then admitted to [facility] for detox from alcohol and cocaine. He is not interested in residential rehab.      Component Value Date/Time   ETH <10 09/15/2023 1652       Component Value Date/Time   LABOPIA NONE DETECTED 08/08/2023 1730   COCAINSCRNUR POSITIVE (A) 08/08/2023 1730   LABBENZ NONE DETECTED 08/08/2023 1730   AMPHETMU NONE DETECTED 08/08/2023 1730   THCU NONE DETECTED 08/08/2023 1730   LABBARB NONE DETECTED 08/08/2023 1730     Interval Hx   Patient Narrative:  On assessment today, patient reports that he is doing well. He reports that he is curious today. He denies any withdrawal symptoms. CIWAs 0 over past 24 hours. I discussed with patient our recommendation for inpatient rehab however patient reports that he would like to attempt intensive outpatient substance use program at this time. He reports that he will go to the groups and then follow-up with AA groups in between. He recognizes that it will be difficult given he lives alone and the people around him use as well. Offered naltrexone to patient however he declined. He is concerned about  not having clothing. He otherwise denies SI/HI/AVH.   Review of Systems  Constitutional: Negative.   Respiratory:  Negative for shortness of breath.   Cardiovascular:  Negative for chest pain.  Gastrointestinal: Negative.   Skin: Negative.   Neurological: Negative.      Past History   Psychiatric History:  Patient reports a history of depression. He reports a past psychiatric hospitalization in October 2020 for and a suicide attempt "a while ago."  Per chart review patient has had multiple inpatient hospitalizations for major depressive disorder with suicidal ideations, he has had 3 within the past year.  He has had medication trials of Abilify, doxepin, Remeron, Wellbutrin.  He has been noncompliant with medications after previous discharges and with outpatient follow-up.  PCP: Storm Frisk, MD   Psychiatric Family History:  Patient states that his 2 brothers died from alcohol and drug use.   Social History:   he lives by himself. He reports that he supports himself with disability check from [employer]. He reports that he lives in an apartment in [place]. He reports that he has been separated from his wife for a while. He reports that he has a daughter in [place] and 6 grandchildren. He reports that his family is in [place]   EtOH:  reports current alcohol use. Began in early teens and drinking approximately 2 to 3 ounce bottles of alcohol per day.  Tobacco:  reports that he has been smoking cigarettes. He has never used smokeless tobacco.  Cannabis: Denies Opiates: Denies Stimulants: Cocaine- did not specify quantity BZO/hypnotics: Denies Seizure/DT: Denies  Treatments:  rehab in [place] 8 to 9 years ago  IVDU: Denies  Past Medical History:  Past Medical History:  Diagnosis Date   Acute bilateral low back pain without sciatica 02/27/2021   Asthma    CAD (coronary artery disease)    Cataract    CKD (chronic kidney disease)    Cocaine use disorder, mild,  abuse (HCC)    COPD (chronic obstructive pulmonary disease) (HCC)    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   Depression    Elevated serum creatinine 08/28/2019   Homelessness 06/19/2020   Homicidal ideations 05/25/2023   Hypertension    Major depressive disorder, recurrent episode, 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/11/2022   Major depressive disorder, single episode, 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/10/2022   MDD (major depressive disorder), recurrent severe, 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   NSTEMI (non-ST elevated myocardial infarction) (HCC)    Schizoaffective disorder (HCC) 11/08/2022   Status post incision and drainage 04/22/2021   Suicidal ideation 06/05/2020   Suicidal ideation 06/05/2020    Past Surgical History:  Procedure Laterality Date   APPENDECTOMY     EYE SURGERY     LEFT HEART CATH AND CORONARY ANGIOGRAPHY N/A 06/05/2020   Procedure: LEFT HEART CATH AND CORONARY ANGIOGRAPHY;  Surgeon: Elder Negus, MD;  Location: [facility] INVASIVE [facility] LAB;  Service: Cardiovascular;  Laterality: N/A;   PROSTATE SURGERY     Family History:  Family History  Problem Relation Age of Onset   Hypertension Mother    Diabetes Mother    Hypertension Father    Hypertension Sister    Social History   Substance and Sexual Activity  Alcohol Use Yes   Comment: 4 40oz beer a week    Social History   Substance and Sexual Activity  Drug Use Not Currently   Types: Cocaine   Comment: cocaine use once a month, last use 6 mos ago    Social History   Socioeconomic History   Marital status: Single    Spouse name: Not on file   Number of children: Not on file   Years of education: Not on file   Highest education level: Not on file  Occupational History   Not on file  Tobacco Use   Smoking status: Some Days    Current packs/day: 0.00    Types: Cigarettes    Last attempt to quit: 12/25/2020    Years since quitting: 2.7   Smokeless tobacco: Never   Tobacco comments:    Smokes a couple cigarettes every now and then   Vaping Use   Vaping status: Never Used  Substance and Sexual Activity   Alcohol use: Yes    Comment: 4 40oz beer a week   Drug use: Not Currently    Types: Cocaine    Comment: cocaine use once a month, last use 6 mos ago   Sexual activity: Not Currently  Other Topics Concern   Not on file  Social History Narrative   His mother passed at age 33 (when he was 31 years old) and his father raised him and his siblings   His parents were pastors as a lot of his sisters and other family members are today   There was a total of 13 children in his family Had 48 sisters, one sister deceased , Had 3 brothers, all brothers deceased   He is the youngest of the 30 and was "always in trouble in my younger years"   Family still live in [place], [place], some in [place], [place]  Has a Daughter and son with a total of 10 grandchildren (6 grand daughters local and 4 grandsons)    Values family   Married twice, present wife incarcerated   Social Determinants of Health   Financial Resource Strain: Low Risk  (10/19/2022)   Overall Financial Resource Strain (CARDIA)    Difficulty of Paying Living Expenses: Not hard at all  Food Insecurity: Food Insecurity Present (09/16/2023)   Hunger Vital Sign    Worried About Running Out of Food in the Last Year: Sometimes true    Ran Out of Food in the Last Year: Sometimes true  Transportation Needs: Unmet Transportation Needs (09/16/2023)   PRAPARE - Administrator, Civil Service (Medical): Yes    Lack of Transportation (Non-Medical): Yes  Physical Activity: Inactive (10/19/2022)   Exercise Vital Sign    Days of Exercise per Week: 0 days    Minutes of Exercise per Session: 0 min  Stress: No Stress Concern Present (10/19/2022)   [name] of Occupational Health - Occupational Stress Questionnaire    Feeling of Stress : Not at all  Social Connections: Moderately Integrated (03/31/2022)   Social Connection and Isolation Panel [NHANES]     Frequency of Communication with Friends and Family: Three times a week    Frequency of Social Gatherings with Friends and Family: Three times a week    Attends Religious Services: 1 to 4 times per year    Active Member of Clubs or Organizations: Yes    Attends Banker Meetings: 1 to 4 times per year    Marital Status: Never married   SDOH: SDOH Screenings   Food Insecurity: Food Insecurity Present (09/16/2023)  Housing: Patient Declined (09/16/2023)  Transportation Needs: Unmet Transportation Needs (09/16/2023)  Utilities: Not At Risk (09/16/2023)  Alcohol Screen: Low Risk  (09/16/2023)  Recent Concern: Alcohol Screen - High Risk (08/08/2023)  Depression (PHQ2-9): Low Risk  (09/16/2023)  Financial Resource Strain: Low Risk  (10/19/2022)  Physical Activity: Inactive (10/19/2022)  Social Connections: Moderately Integrated (03/31/2022)  Stress: No Stress Concern Present (10/19/2022)  Tobacco Use: High Risk (08/09/2023)   Additional Social History:   Current Medications   Current Facility-Administered Medications  Medication Dose Route Frequency Provider Last Rate Last Admin   acetaminophen (TYLENOL) tablet 650 mg  650 mg Oral Q6H PRN White, Patrice L, NP   650 mg at 09/20/23 0943   albuterol (VENTOLIN HFA) 108 (90 Base) MCG/ACT inhaler 2 puff  2 puff Inhalation Q6H PRN White, Patrice L, NP       alum & mag hydroxide-simeth (MAALOX/MYLANTA) 200-200-20 MG/5ML suspension 30 mL  30 mL Oral Q4H PRN White, Patrice L, NP       ARIPiprazole (ABILIFY) tablet 5 mg  5 mg Oral Daily White, Patrice L, NP   5 mg at 09/20/23 0940   atorvastatin (LIPITOR) tablet 80 mg  80 mg Oral Daily White, Patrice L, NP   80 mg at 09/20/23 0940   cloNIDine (CATAPRES) tablet 0.1 mg  0.1 mg Oral BID PRN White, Patrice L, NP       diltiazem (CARDIZEM CD) 24 hr capsule 180 mg  180 mg Oral Daily White, Patrice L, NP   180 mg at 09/20/23 0940   hydrOXYzine (ATARAX) tablet 50 mg  50 mg Oral TID PRN White,  Patrice L, NP       latanoprost (XALATAN) 0.005 % ophthalmic solution 1 drop  1 drop Both Eyes QHS Lamar Sprinkles, MD   1  drop at 09/19/23 2102   magnesium hydroxide (MILK OF MAGNESIA) suspension 30 mL  30 mL Oral Daily PRN White, Patrice L, NP   30 mL at 09/18/23 4696   mometasone-formoterol (DULERA) 200-5 MCG/ACT inhaler 2 puff  2 puff Inhalation BID White, Patrice L, NP   2 puff at 09/20/23 0940   multivitamin with minerals tablet 1 tablet  1 tablet Oral Daily White, Patrice L, NP   1 tablet at 09/20/23 0940   nicotine (NICODERM CQ - dosed in mg/24 hours) patch 21 mg  21 mg Transdermal Q0600 White, Patrice L, NP       thiamine (VITAMIN B1) tablet 100 mg  100 mg Oral Daily White, Patrice L, NP   100 mg at 09/20/23 0940   traZODone (DESYREL) tablet 50 mg  50 mg Oral QHS PRN White, Patrice L, NP   50 mg at 09/19/23 2102   Current Outpatient Medications  Medication Sig Dispense Refill   albuterol (VENTOLIN HFA) 108 (90 Base) MCG/ACT inhaler Inhale 2 puffs into the lungs every 6 (six) hours as needed for shortness of breath. 18 g 1   ARIPiprazole (ABILIFY) 5 MG tablet Take 1 tablet (5 mg total) by mouth daily. 30 tablet 0   atorvastatin (LIPITOR) 80 MG tablet Take 1 tablet (80 mg total) by mouth daily. 30 tablet 0   diltiazem (CARDIZEM CD) 180 MG 24 hr capsule Take 1 capsule (180 mg total) by mouth daily. 30 capsule 0   doxepin (SINEQUAN) 75 MG capsule Take 1 capsule (75 mg total) by mouth at bedtime. 30 capsule 0   hydrOXYzine (ATARAX) 50 MG tablet Take 1 tablet (50 mg total) by mouth 3 (three) times daily as needed for anxiety. 30 tablet 0   latanoprost (XALATAN) 0.005 % ophthalmic solution Place 1 drop into both eyes at bedtime. 2.5 mL 12   mometasone-formoterol (DULERA) 200-5 MCG/ACT AERO Inhale 2 puffs into the lungs 2 (two) times daily. 1 each 1   traZODone (DESYREL) 50 MG tablet Take 1 tablet (50 mg total) by mouth at bedtime as needed for sleep. 30 tablet 0    Labs / Images  Lab  Results:  Admission on 09/15/2023, Discharged on 09/16/2023  Component Date Value Ref Range Status   WBC 09/15/2023 8.9  4.0 - 10.5 K/uL Final   RBC 09/15/2023 4.35  4.22 - 5.81 MIL/uL Final   Hemoglobin 09/15/2023 13.0  13.0 - 17.0 g/dL Final   HCT 29/52/8413 40.7  39.0 - 52.0 % Final   MCV 09/15/2023 93.6  80.0 - 100.0 fL Final   MCH 09/15/2023 29.9  26.0 - 34.0 pg Final   MCHC 09/15/2023 31.9  30.0 - 36.0 g/dL Final   RDW 24/40/1027 12.6  11.5 - 15.5 % Final   Platelets 09/15/2023 194  150 - 400 K/uL Final   nRBC 09/15/2023 0.0  0.0 - 0.2 % Final   Neutrophils Relative % 09/15/2023 52  % Final   Neutro Abs 09/15/2023 4.6  1.7 - 7.7 K/uL Final   Lymphocytes Relative 09/15/2023 36  % Final   Lymphs Abs 09/15/2023 3.2  0.7 - 4.0 K/uL Final   Monocytes Relative 09/15/2023 7  % Final   Monocytes Absolute 09/15/2023 0.7  0.1 - 1.0 K/uL Final   Eosinophils Relative 09/15/2023 4  % Final   Eosinophils Absolute 09/15/2023 0.3  0.0 - 0.5 K/uL Final   Basophils Relative 09/15/2023 1  % Final   Basophils Absolute 09/15/2023 0.1  0.0 - 0.1  K/uL Final   Immature Granulocytes 09/15/2023 0  % Final   Abs Immature Granulocytes 09/15/2023 0.03  0.00 - 0.07 K/uL Final   Performed at [facility], [address] [place], [place] [address]   Sodium 09/15/2023 138  135 - 145 mmol/L Final   Potassium 09/15/2023 4.0  3.5 - 5.1 mmol/L Final   Chloride 09/15/2023 105  98 - 111 mmol/L Final   CO2 09/15/2023 24  22 - 32 mmol/L Final   Glucose, Bld 09/15/2023 90  70 - 99 mg/dL Final   Glucose reference range applies only to samples taken after fasting for at least 8 hours.   BUN 09/15/2023 6 (L)  8 - 23 mg/dL Final   Creatinine, Ser 09/15/2023 1.17  0.61 - 1.24 mg/dL Final   Calcium 36/64/4034 8.9  8.9 - 10.3 mg/dL Final   Total Protein 74/25/9563 7.1  6.5 - 8.1 g/dL Final   Albumin 87/56/4332 3.5  3.5 - 5.0 g/dL Final   AST 95/18/8416 19  15 - 41 U/L Final   ALT 09/15/2023 15  0 - 44 U/L Final    Alkaline Phosphatase 09/15/2023 70  38 - 126 U/L Final   Total Bilirubin 09/15/2023 0.8  <1.2 mg/dL Final   GFR, Estimated 09/15/2023 >60  >60 mL/min Final   Comment: (NOTE) Calculated using the CKD-EPI Creatinine Equation (2021)    Anion gap 09/15/2023 9  5 - 15 Final   Performed at [facility], [address] [place], [place] [address]   Hgb A1c MFr Bld 09/15/2023 5.1  4.8 - 5.6 % Final   Comment: (NOTE) Pre diabetes:          5.7%-6.4%  Diabetes:              >6.4%  Glycemic control for   <7.0% adults with diabetes    Mean Plasma Glucose 09/15/2023 99.67  mg/dL Final   Performed at [facility], [address] [place], [place] [address]   Alcohol, Ethyl (B) 09/15/2023 <10  <10 mg/dL Final   Comment: (NOTE) Lowest detectable limit for serum alcohol is 10 mg/dL.  For medical purposes only. Performed at [facility], [address] [place], [place] [address]    Cholesterol 09/15/2023 159  0 - 200 mg/dL Final   Triglycerides 57/32/2025 53  <150 mg/dL Final   HDL 42/70/6237 61  >40 mg/dL Final   Total CHOL/HDL Ratio 09/15/2023 2.6  RATIO Final   VLDL 09/15/2023 11  0 - 40 mg/dL Final   LDL Cholesterol 09/15/2023 87  0 - 99 mg/dL Final   Comment:        Total Cholesterol/HDL:CHD Risk Coronary Heart Disease Risk Table                     Men   Women  1/2 Average Risk   3.4   3.3  Average Risk       5.0   4.4  2 X Average Risk   9.6   7.1  3 X Average Risk  23.4   11.0        Use the calculated Patient Ratio above and the CHD Risk Table to determine the patient's CHD Risk.        ATP III CLASSIFICATION (LDL):  <100     mg/dL   Optimal  628-315  mg/dL   Near or Above  Optimal  130-159  mg/dL   Borderline  161-096  mg/dL   High  >045     mg/dL   Very High Performed at [facility], [address], [place], [place] [address]    TSH 09/15/2023 3.515  0.350 - 4.500 uIU/mL Final   Comment: Performed by a 3rd Generation assay  with a functional sensitivity of <=0.01 uIU/mL. Performed at [facility], [address], [place], [place] [address]    POC Amphetamine UR 09/15/2023 None Detected  NONE DETECTED (Cut Off Level 1000 ng/mL) Final   POC Secobarbital (BAR) 09/15/2023 None Detected  NONE DETECTED (Cut Off Level 300 ng/mL) Final   POC Buprenorphine (BUP) 09/15/2023 None Detected  NONE DETECTED (Cut Off Level 10 ng/mL) Final   POC Oxazepam (BZO) 09/15/2023 None Detected  NONE DETECTED (Cut Off Level 300 ng/mL) Final   POC Cocaine UR 09/15/2023 Positive (A)  NONE DETECTED (Cut Off Level 300 ng/mL) Final   POC Methamphetamine UR 09/15/2023 None Detected  NONE DETECTED (Cut Off Level 1000 ng/mL) Final   POC Morphine 09/15/2023 None Detected  NONE DETECTED (Cut Off Level 300 ng/mL) Final   POC Methadone UR 09/15/2023 None Detected  NONE DETECTED (Cut Off Level 300 ng/mL) Final   POC Oxycodone UR 09/15/2023 None Detected  NONE DETECTED (Cut Off Level 100 ng/mL) Final   POC Marijuana UR 09/15/2023 None Detected  NONE DETECTED (Cut Off Level 50 ng/mL) Final  Admission on 08/08/2023, Discharged on 08/22/2023  Component Date Value Ref Range Status   Adenovirus 08/13/2023 NOT DETECTED  NOT DETECTED Final   Coronavirus 229E 08/13/2023 NOT DETECTED  NOT DETECTED Final   Comment: (NOTE) The Coronavirus on the Respiratory Panel, DOES NOT test for the novel  Coronavirus (2019 nCoV)    Coronavirus HKU1 08/13/2023 NOT DETECTED  NOT DETECTED Final   Coronavirus NL63 08/13/2023 NOT DETECTED  NOT DETECTED Final   Coronavirus OC43 08/13/2023 NOT DETECTED  NOT DETECTED Final   Metapneumovirus 08/13/2023 NOT DETECTED  NOT DETECTED Final   Rhinovirus / Enterovirus 08/13/2023 NOT DETECTED  NOT DETECTED Final   Influenza A 08/13/2023 NOT DETECTED  NOT DETECTED Final   Influenza B 08/13/2023 NOT DETECTED  NOT DETECTED Final   Parainfluenza Virus 1 08/13/2023 NOT DETECTED  NOT DETECTED Final   Parainfluenza Virus 2 08/13/2023 NOT  DETECTED  NOT DETECTED Final   Parainfluenza Virus 3 08/13/2023 NOT DETECTED  NOT DETECTED Final   Parainfluenza Virus 4 08/13/2023 NOT DETECTED  NOT DETECTED Final   Respiratory Syncytial Virus 08/13/2023 NOT DETECTED  NOT DETECTED Final   Bordetella pertussis 08/13/2023 NOT DETECTED  NOT DETECTED Final   Bordetella Parapertussis 08/13/2023 NOT DETECTED  NOT DETECTED Final   Chlamydophila pneumoniae 08/13/2023 NOT DETECTED  NOT DETECTED Final   Mycoplasma pneumoniae 08/13/2023 NOT DETECTED  NOT DETECTED Final   Performed at [facility], [address] [place], [place] [address]   SARS Coronavirus 2 by RT PCR 08/14/2023 NEGATIVE  NEGATIVE Final   Comment: (NOTE) SARS-CoV-2 target nucleic acids are NOT DETECTED.  The SARS-CoV-2 RNA is generally detectable in upper and lower respiratory specimens during the acute phase of infection. The lowest concentration of SARS-CoV-2 viral copies this assay can detect is 250 copies / mL. A negative result does not preclude SARS-CoV-2 infection and should not be used as the sole basis for treatment or other patient management decisions.  A negative result may occur with improper specimen collection / handling, submission  of specimen other than nasopharyngeal swab, presence of viral mutation(s) within the areas targeted by this assay, and inadequate number of viral copies (<250 copies / mL). A negative result must be combined with clinical observations, patient history, and epidemiological information.  Fact Sheet for Patients:   RoadLapTop.co.za  Fact Sheet for Healthcare Providers: http://kim-miller.com/  This test is not yet approved or                           cleared by the [place] FDA and has been authorized for detection and/or diagnosis of SARS-CoV-2 by FDA under an Emergency Use Authorization (EUA).  This EUA will remain in effect (meaning this test can be used) for the duration of  the COVID-19 declaration under Section 564(b)(1) of the Act, 21 U.S.C. section 360bbb-3(b)(1), unless the authorization is terminated or revoked sooner.  Performed at [facility], [address] [place], [place] [address]   Admission on 08/08/2023, Discharged on 08/08/2023  Component Date Value Ref Range Status   Sodium 08/08/2023 137  135 - 145 mmol/L Final   Potassium 08/08/2023 3.9  3.5 - 5.1 mmol/L Final   Chloride 08/08/2023 107  98 - 111 mmol/L Final   CO2 08/08/2023 20 (L)  22 - 32 mmol/L Final   Glucose, Bld 08/08/2023 148 (H)  70 - 99 mg/dL Final   Glucose reference range applies only to samples taken after fasting for at least 8 hours.   BUN 08/08/2023 9  8 - 23 mg/dL Final   Creatinine, Ser 08/08/2023 1.43 (H)  0.61 - 1.24 mg/dL Final   Calcium 60/45/4098 8.5 (L)  8.9 - 10.3 mg/dL Final   Total Protein 11/91/4782 7.1  6.5 - 8.1 g/dL Final   Albumin 95/62/1308 3.4 (L)  3.5 - 5.0 g/dL Final   AST 65/78/4696 25  15 - 41 U/L Final   ALT 08/08/2023 18  0 - 44 U/L Final   Alkaline Phosphatase 08/08/2023 76  38 - 126 U/L Final   Total Bilirubin 08/08/2023 0.7  0.3 - 1.2 mg/dL Final   GFR, Estimated 08/08/2023 53 (L)  >60 mL/min Final   Comment: (NOTE) Calculated using the CKD-EPI Creatinine Equation (2021)    Anion gap 08/08/2023 10  5 - 15 Final   Performed at [facility], [address], [place], [place] [address]   Alcohol, Ethyl (B) 08/08/2023 <10  <10 mg/dL Final   Comment: (NOTE) Lowest detectable limit for serum alcohol is 10 mg/dL.  For medical purposes only. Performed at [facility], [address] [place], [place] [address]    Salicylate Lvl 08/08/2023 <7.0 (L)  7.0 - 30.0 mg/dL Final   Performed at [facility], [address] [place] [address], [place], [place] [address]   Acetaminophen (Tylenol), Serum 08/08/2023 <10 (L)  10 - 30 ug/mL Final   Comment: (NOTE) Therapeutic concentrations vary significantly. A range of 10-30 ug/mL  may be an  effective concentration for many patients. However, some  are best treated at concentrations outside of this range. Acetaminophen concentrations >150 ug/mL at 4 hours after ingestion  and >50 ug/mL at 12 hours after ingestion are often associated with  toxic reactions.  Performed at [facility], [address] [place], [place] [address]    WBC 08/08/2023 9.9  4.0 - 10.5 K/uL Final   RBC 08/08/2023 4.58  4.22 - 5.81 MIL/uL Final   Hemoglobin 08/08/2023 14.0  13.0 -  17.0 g/dL Final   HCT 78/29/5621 44.0  39.0 - 52.0 % Final   MCV 08/08/2023 96.1  80.0 - 100.0 fL Final   MCH 08/08/2023 30.6  26.0 - 34.0 pg Final   MCHC 08/08/2023 31.8  30.0 - 36.0 g/dL Final   RDW 30/86/5784 12.6  11.5 - 15.5 % Final   Platelets 08/08/2023 249  150 - 400 K/uL Final   nRBC 08/08/2023 0.0  0.0 - 0.2 % Final   Performed at [facility], [address], [place], [place] [address]   Opiates 08/08/2023 NONE DETECTED  NONE DETECTED Final   Cocaine 08/08/2023 POSITIVE (A)  NONE DETECTED Final   Benzodiazepines 08/08/2023 NONE DETECTED  NONE DETECTED Final   Amphetamines 08/08/2023 NONE DETECTED  NONE DETECTED Final   Tetrahydrocannabinol 08/08/2023 NONE DETECTED  NONE DETECTED Final   Barbiturates 08/08/2023 NONE DETECTED  NONE DETECTED Final   Comment: (NOTE) DRUG SCREEN FOR MEDICAL PURPOSES ONLY.  IF CONFIRMATION IS NEEDED FOR ANY PURPOSE, NOTIFY LAB WITHIN 5 DAYS.  LOWEST DETECTABLE LIMITS FOR URINE DRUG SCREEN Drug Class                     Cutoff (ng/mL) Amphetamine and metabolites    1000 Barbiturate and metabolites    200 Benzodiazepine                 200 Opiates and metabolites        300 Cocaine and metabolites        300 THC                            50 Performed at [facility], [address], [place], [place] [address]    SARS Coronavirus 2 by RT PCR 08/08/2023 NEGATIVE  NEGATIVE Final   Performed at [facility], [address], [place], [place] [address]   Admission on 07/15/2023, Discharged on 07/17/2023  Component Date Value Ref Range Status   Sodium 07/15/2023 135  135 - 145 mmol/L Final   Potassium 07/15/2023 3.7  3.5 - 5.1 mmol/L Final   Chloride 07/15/2023 107  98 - 111 mmol/L Final   CO2 07/15/2023 19 (L)  22 - 32 mmol/L Final   Glucose, Bld 07/15/2023 129 (H)  70 - 99 mg/dL Final   Glucose reference range applies only to samples taken after fasting for at least 8 hours.   BUN 07/15/2023 11  8 - 23 mg/dL Final   Creatinine, Ser 07/15/2023 1.39 (H)  0.61 - 1.24 mg/dL Final   Calcium 08/28/2535 8.4 (L)  8.9 - 10.3 mg/dL Final   Total Protein 64/40/3474 6.9  6.5 - 8.1 g/dL Final   Albumin 25/95/6387 3.6  3.5 - 5.0 g/dL Final   AST 56/43/3295 21  15 - 41 U/L Final   ALT 07/15/2023 17  0 - 44 U/L Final   Alkaline Phosphatase 07/15/2023 64  38 - 126 U/L Final   Total Bilirubin 07/15/2023 0.5  0.3 - 1.2 mg/dL Final   GFR, Estimated 07/15/2023 55 (L)  >60 mL/min Final   Comment: (NOTE) Calculated using the CKD-EPI Creatinine Equation (2021)    Anion gap 07/15/2023 9  5 - 15 Final   Performed at [facility], [address], [place], [place] [address]   Alcohol, Ethyl (B) 07/15/2023 <10  <10 mg/dL Final   Comment: (NOTE) Lowest detectable limit for serum alcohol  is 10 mg/dL.  For medical purposes only. Performed at [facility], [address], [place], [place] [address]    Salicylate Lvl 07/15/2023 <7.0 (L)  7.0 - 30.0 mg/dL Final   Performed at [facility], [address], [place], [place] [address]   Acetaminophen (Tylenol), Serum 07/15/2023 <10 (L)  10 - 30 ug/mL Final   Comment: (NOTE) Therapeutic concentrations vary significantly. A range of 10-30 ug/mL  may be an effective concentration for many patients. However, some  are best treated at concentrations outside of this range. Acetaminophen concentrations >150 ug/mL at 4 hours after ingestion  and >50 ug/mL at 12 hours after ingestion are often  associated with  toxic reactions.  Performed at [facility], [address] [place], [place] [address]    WBC 07/15/2023 9.1  4.0 - 10.5 K/uL Final   RBC 07/15/2023 4.20 (L)  4.22 - 5.81 MIL/uL Final   Hemoglobin 07/15/2023 13.0  13.0 - 17.0 g/dL Final   HCT 47/42/5956 40.5  39.0 - 52.0 % Final   MCV 07/15/2023 96.4  80.0 - 100.0 fL Final   MCH 07/15/2023 31.0  26.0 - 34.0 pg Final   MCHC 07/15/2023 32.1  30.0 - 36.0 g/dL Final   RDW 38/75/6433 12.6  11.5 - 15.5 % Final   Platelets 07/15/2023 267  150 - 400 K/uL Final   nRBC 07/15/2023 0.0  0.0 - 0.2 % Final   Performed at [facility], [address], [place], [place] [address]   Opiates 07/15/2023 NONE DETECTED  NONE DETECTED Final   Cocaine 07/15/2023 POSITIVE (A)  NONE DETECTED Final   Benzodiazepines 07/15/2023 NONE DETECTED  NONE DETECTED Final   Amphetamines 07/15/2023 NONE DETECTED  NONE DETECTED Final   Tetrahydrocannabinol 07/15/2023 POSITIVE (A)  NONE DETECTED Final   Barbiturates 07/15/2023 NONE DETECTED  NONE DETECTED Final   Comment: (NOTE) DRUG SCREEN FOR MEDICAL PURPOSES ONLY.  IF CONFIRMATION IS NEEDED FOR ANY PURPOSE, NOTIFY LAB WITHIN 5 DAYS.  LOWEST DETECTABLE LIMITS FOR URINE DRUG SCREEN Drug Class                     Cutoff (ng/mL) Amphetamine and metabolites    1000 Barbiturate and metabolites    200 Benzodiazepine                 200 Opiates and metabolites        300 Cocaine and metabolites        300 THC                            50 Performed at [facility], [address] [place], [place] [address]    Color, Urine 07/15/2023 YELLOW  YELLOW Final   APPearance 07/15/2023 CLEAR  CLEAR Final   Specific Gravity, Urine 07/15/2023 >1.030 (H)  1.005 - 1.030 Final   pH 07/15/2023 6.0  5.0 - 8.0 Final   Glucose, UA 07/15/2023 NEGATIVE  NEGATIVE mg/dL Final   Hgb urine dipstick 07/15/2023 NEGATIVE  NEGATIVE Final   Bilirubin Urine 07/15/2023 NEGATIVE  NEGATIVE Final   Ketones, ur  07/15/2023 NEGATIVE  NEGATIVE mg/dL Final   Protein, ur 04/30/1600 NEGATIVE  NEGATIVE mg/dL Final   Nitrite 09/32/3557 NEGATIVE  NEGATIVE Final   Leukocytes,Ua 07/15/2023 NEGATIVE  NEGATIVE Final   Comment: Microscopic not done on urines with negative protein, blood, leukocytes, nitrite, or glucose <  500 mg/dL. Performed at [facility], [address], [place], [place] [address]    SARS Coronavirus 2 by RT PCR 07/16/2023 NEGATIVE  NEGATIVE Final   Performed at [facility], [address] [place], [place] [address]  Admission on 07/12/2023, Discharged on 07/12/2023  Component Date Value Ref Range Status   Troponin I (High Sensitivity) 07/12/2023 7  <18 ng/L Final   Comment: (NOTE) Elevated high sensitivity troponin I (hsTnI) values and significant  changes across serial measurements may suggest ACS but many other  chronic and acute conditions are known to elevate hsTnI results.  Refer to the "Links" section for chest pain algorithms and additional  guidance. Performed at [facility], [address] [place], [place] [address]    Sodium 07/12/2023 136  135 - 145 mmol/L Final   Potassium 07/12/2023 4.1  3.5 - 5.1 mmol/L Final   Chloride 07/12/2023 106  98 - 111 mmol/L Final   CO2 07/12/2023 19 (L)  22 - 32 mmol/L Final   Glucose, Bld 07/12/2023 117 (H)  70 - 99 mg/dL Final   Glucose reference range applies only to samples taken after fasting for at least 8 hours.   BUN 07/12/2023 10  8 - 23 mg/dL Final   Creatinine, Ser 07/12/2023 1.17  0.61 - 1.24 mg/dL Final   Calcium 65/78/4696 8.2 (L)  8.9 - 10.3 mg/dL Final   Total Protein 29/52/8413 6.6  6.5 - 8.1 g/dL Final   Albumin 24/40/1027 3.3 (L)  3.5 - 5.0 g/dL Final   AST 25/36/6440 22  15 - 41 U/L Final   ALT 07/12/2023 16  0 - 44 U/L Final   Alkaline Phosphatase 07/12/2023 62  38 - 126 U/L Final   Total Bilirubin 07/12/2023 0.6  0.3 - 1.2 mg/dL Final   GFR, Estimated 07/12/2023 >60  >60 mL/min Final   Comment:  (NOTE) Calculated using the CKD-EPI Creatinine Equation (2021)    Anion gap 07/12/2023 11  5 - 15 Final   Performed at [facility], [address], [place], [place] [address]   Alcohol, Ethyl (B) 07/12/2023 <10  <10 mg/dL Final   Comment: (NOTE) Lowest detectable limit for serum alcohol is 10 mg/dL.  For medical purposes only. Performed at [facility], [address], [place], [place] [address]    Salicylate Lvl 07/12/2023 <7.0 (L)  7.0 - 30.0 mg/dL Final   Performed at [facility], [address], [place], [place] [address]   Acetaminophen (Tylenol), Serum 07/12/2023 <10 (L)  10 - 30 ug/mL Final   Comment: (NOTE) Therapeutic concentrations vary significantly. A range of 10-30 ug/mL  may be an effective concentration for many patients. However, some  are best treated at concentrations outside of this range. Acetaminophen concentrations >150 ug/mL at 4 hours after ingestion  and >50 ug/mL at 12 hours after ingestion are often associated with  toxic reactions.  Performed at [facility], [address], [place], [place] [address]    WBC 07/12/2023 7.9  4.0 - 10.5 K/uL Final   RBC 07/12/2023 4.43  4.22 - 5.81 MIL/uL Final   Hemoglobin 07/12/2023 13.3  13.0 - 17.0 g/dL Final   HCT 18/84/1660 41.7  39.0 - 52.0 % Final   MCV 07/12/2023 94.1  80.0 - 100.0 fL Final   MCH 07/12/2023 30.0  26.0 - 34.0 pg Final   MCHC 07/12/2023 31.9  30.0 - 36.0 g/dL Final  RDW 07/12/2023 12.5  11.5 - 15.5 % Final   Platelets 07/12/2023 255  150 - 400 K/uL Final   nRBC 07/12/2023 0.0  0.0 - 0.2 % Final   Performed at [facility], [address] [place], [place] [address]   Opiates 07/12/2023 NONE DETECTED  NONE DETECTED Final   Cocaine 07/12/2023 POSITIVE (A)  NONE DETECTED Final   Benzodiazepines 07/12/2023 NONE DETECTED  NONE DETECTED Final   Amphetamines 07/12/2023 NONE DETECTED  NONE DETECTED Final   Tetrahydrocannabinol 07/12/2023 POSITIVE (A)  NONE DETECTED  Final   Barbiturates 07/12/2023 NONE DETECTED  NONE DETECTED Final   Comment: (NOTE) DRUG SCREEN FOR MEDICAL PURPOSES ONLY.  IF CONFIRMATION IS NEEDED FOR ANY PURPOSE, NOTIFY LAB WITHIN 5 DAYS.  LOWEST DETECTABLE LIMITS FOR URINE DRUG SCREEN Drug Class                     Cutoff (ng/mL) Amphetamine and metabolites    1000 Barbiturate and metabolites    200 Benzodiazepine                 200 Opiates and metabolites        300 Cocaine and metabolites        300 THC                            50 Performed at [facility], [address], [place], [place] [address]    Troponin I (High Sensitivity) 07/12/2023 7  <18 ng/L Final   Comment: (NOTE) Elevated high sensitivity troponin I (hsTnI) values and significant  changes across serial measurements may suggest ACS but many other  chronic and acute conditions are known to elevate hsTnI results.  Refer to the "Links" section for chest pain algorithms and additional  guidance. Performed at [facility], [address], [place], [place] [address]   Admission on 06/01/2023, Discharged on 06/01/2023  Component Date Value Ref Range Status   Fecal Occult Bld 06/01/2023 POSITIVE (A)  NEGATIVE Final   Sodium 06/01/2023 135  135 - 145 mmol/L Final   Potassium 06/01/2023 4.0  3.5 - 5.1 mmol/L Final   Chloride 06/01/2023 102  98 - 111 mmol/L Final   CO2 06/01/2023 22  22 - 32 mmol/L Final   Glucose, Bld 06/01/2023 100 (H)  70 - 99 mg/dL Final   Glucose reference range applies only to samples taken after fasting for at least 8 hours.   BUN 06/01/2023 13  8 - 23 mg/dL Final   Creatinine, Ser 06/01/2023 1.35 (H)  0.61 - 1.24 mg/dL Final   Calcium 25/36/6440 8.6 (L)  8.9 - 10.3 mg/dL Final   Total Protein 34/74/2595 6.8  6.5 - 8.1 g/dL Final   Albumin 63/87/5643 3.5  3.5 - 5.0 g/dL Final   AST 32/95/1884 26  15 - 41 U/L Final   ALT 06/01/2023 18  0 - 44 U/L Final   Alkaline Phosphatase 06/01/2023 65  38 - 126 U/L Final   Total  Bilirubin 06/01/2023 0.8  0.3 - 1.2 mg/dL Final   GFR, Estimated 06/01/2023 57 (L)  >60 mL/min Final   Comment: (NOTE) Calculated using the CKD-EPI Creatinine Equation (2021)    Anion gap 06/01/2023 11  5 - 15 Final   Performed at [facility], [address], [place], [place] [address]   WBC 06/01/2023 11.7 (H)  4.0 - 10.5 K/uL Final  RBC 06/01/2023 3.94 (L)  4.22 - 5.81 MIL/uL Final   Hemoglobin 06/01/2023 12.2 (L)  13.0 - 17.0 g/dL Final   HCT 78/29/5621 37.8 (L)  39.0 - 52.0 % Final   MCV 06/01/2023 95.9  80.0 - 100.0 fL Final   MCH 06/01/2023 31.0  26.0 - 34.0 pg Final   MCHC 06/01/2023 32.3  30.0 - 36.0 g/dL Final   RDW 30/86/5784 12.4  11.5 - 15.5 % Final   Platelets 06/01/2023 238  150 - 400 K/uL Final   nRBC 06/01/2023 0.0  0.0 - 0.2 % Final   Neutrophils Relative % 06/01/2023 75  % Final   Neutro Abs 06/01/2023 8.7 (H)  1.7 - 7.7 K/uL Final   Lymphocytes Relative 06/01/2023 16  % Final   Lymphs Abs 06/01/2023 1.9  0.7 - 4.0 K/uL Final   Monocytes Relative 06/01/2023 7  % Final   Monocytes Absolute 06/01/2023 0.9  0.1 - 1.0 K/uL Final   Eosinophils Relative 06/01/2023 0  % Final   Eosinophils Absolute 06/01/2023 0.0  0.0 - 0.5 K/uL Final   Basophils Relative 06/01/2023 1  % Final   Basophils Absolute 06/01/2023 0.1  0.0 - 0.1 K/uL Final   Immature Granulocytes 06/01/2023 1  % Final   Abs Immature Granulocytes 06/01/2023 0.10 (H)  0.00 - 0.07 K/uL Final   Performed at [facility], [address] [place], [place] [address]   Prothrombin Time 06/01/2023 13.7  11.4 - 15.2 seconds Final   INR 06/01/2023 1.0  0.8 - 1.2 Final   Comment: (NOTE) INR goal varies based on device and disease states. Performed at [facility], [address] [place], [place] [address]    ABO/RH(D) 06/01/2023 O POS   Final   Antibody Screen 06/01/2023 NEG   Final   Sample Expiration 06/01/2023    Final                   Value:06/04/2023,2359 Performed at [facility]  Lab, [address], [place], [place] [address]    ABO/RH(D) 06/01/2023    Final                   Value:O POS Performed at [facility], [address] [place], [place] [address]   Admission on 05/24/2023, Discharged on 05/25/2023  Component Date Value Ref Range Status   Sodium 05/24/2023 139  135 - 145 mmol/L Final   Potassium 05/24/2023 3.5  3.5 - 5.1 mmol/L Final   Chloride 05/24/2023 105  98 - 111 mmol/L Final   CO2 05/24/2023 21 (L)  22 - 32 mmol/L Final   Glucose, Bld 05/24/2023 117 (H)  70 - 99 mg/dL Final   Glucose reference range applies only to samples taken after fasting for at least 8 hours.   BUN 05/24/2023 7 (L)  8 - 23 mg/dL Final   Creatinine, Ser 05/24/2023 1.10  0.61 - 1.24 mg/dL Final   Calcium 53/66/4403 8.7 (L)  8.9 - 10.3 mg/dL Final   Total Protein 47/42/5956 6.9  6.5 - 8.1 g/dL Final   Albumin 38/75/6433 3.6  3.5 - 5.0 g/dL Final   AST 29/51/8841 19  15 - 41 U/L Final   ALT 05/24/2023 14  0 - 44 U/L Final   Alkaline Phosphatase 05/24/2023 62  38 - 126 U/L Final   Total Bilirubin 05/24/2023 0.7  0.3 - 1.2 mg/dL Final   GFR, Estimated 05/24/2023 >60  >60 mL/min  Final   Comment: (NOTE) Calculated using the CKD-EPI Creatinine Equation (2021)    Anion gap 05/24/2023 13  5 - 15 Final   Performed at [facility], [address], [place], [place] [address]   Alcohol, Ethyl (B) 05/24/2023 18 (H)  <10 mg/dL Final   Comment: (NOTE) Lowest detectable limit for serum alcohol is 10 mg/dL.  For medical purposes only. Performed at [facility], [address] [place], [place] [address]    Salicylate Lvl 05/24/2023 <7.0 (L)  7.0 - 30.0 mg/dL Final   Performed at [facility], [address] [place], [place] [address]   Acetaminophen (Tylenol), Serum 05/24/2023 <10 (L)  10 - 30 ug/mL Final   Comment: (NOTE) Therapeutic concentrations vary significantly. A range of 10-30 ug/mL  may be an effective concentration for many patients. However, some   are best treated at concentrations outside of this range. Acetaminophen concentrations >150 ug/mL at 4 hours after ingestion  and >50 ug/mL at 12 hours after ingestion are often associated with  toxic reactions.  Performed at [facility], [address], [place], [place] [address]    WBC 05/24/2023 7.7  4.0 - 10.5 K/uL Final   RBC 05/24/2023 4.01 (L)  4.22 - 5.81 MIL/uL Final   Hemoglobin 05/24/2023 12.5 (L)  13.0 - 17.0 g/dL Final   HCT 28/41/3244 38.3 (L)  39.0 - 52.0 % Final   MCV 05/24/2023 95.5  80.0 - 100.0 fL Final   MCH 05/24/2023 31.2  26.0 - 34.0 pg Final   MCHC 05/24/2023 32.6  30.0 - 36.0 g/dL Final   RDW 11/03/7251 12.7  11.5 - 15.5 % Final   Platelets 05/24/2023 255  150 - 400 K/uL Final   nRBC 05/24/2023 0.0  0.0 - 0.2 % Final   Performed at [facility], [address] [place], [place] [address]   Opiates 05/24/2023 NONE DETECTED  NONE DETECTED Final   Cocaine 05/24/2023 POSITIVE (A)  NONE DETECTED Final   Benzodiazepines 05/24/2023 NONE DETECTED  NONE DETECTED Final   Amphetamines 05/24/2023 NONE DETECTED  NONE DETECTED Final   Tetrahydrocannabinol 05/24/2023 NONE DETECTED  NONE DETECTED Final   Barbiturates 05/24/2023 NONE DETECTED  NONE DETECTED Final   Comment: (NOTE) DRUG SCREEN FOR MEDICAL PURPOSES ONLY.  IF CONFIRMATION IS NEEDED FOR ANY PURPOSE, NOTIFY LAB WITHIN 5 DAYS.  LOWEST DETECTABLE LIMITS FOR URINE DRUG SCREEN Drug Class                     Cutoff (ng/mL) Amphetamine and metabolites    1000 Barbiturate and metabolites    200 Benzodiazepine                 200 Opiates and metabolites        300 Cocaine and metabolites        300 THC                            50 Performed at [facility], [address], [place], [place] [address]    Troponin I (High Sensitivity) 05/24/2023 14  <18 ng/L Final   Comment: (NOTE) Elevated high sensitivity troponin I (hsTnI) values and significant  changes across serial measurements may  suggest ACS but many other  chronic and acute conditions are known to elevate hsTnI results.  Refer to the "Links" section for chest pain algorithms and additional  guidance. Performed at [facility], [address], [place], [place] [address]    Troponin I (High Sensitivity) 05/24/2023 13  <18 ng/L Final   Comment: (NOTE) Elevated high sensitivity troponin I (hsTnI) values and significant  changes across serial measurements may suggest ACS but many other  chronic and acute conditions are known to elevate hsTnI results.  Refer to the "Links" section for chest pain algorithms and additional  guidance. Performed at [facility], [address] [place], [place] [address]   Admission on 04/05/2023, Discharged on 04/06/2023  Component Date Value Ref Range Status   Acetaminophen (Tylenol), Serum 04/05/2023 <10 (L)  10 - 30 ug/mL Final   Comment: (NOTE) Therapeutic concentrations vary significantly. A range of 10-30 ug/mL  may be an effective concentration for many patients. However, some  are best treated at concentrations outside of this range. Acetaminophen concentrations >150 ug/mL at 4 hours after ingestion  and >50 ug/mL at 12 hours after ingestion are often associated with  toxic reactions.  Performed at [facility], [address] [place], [place] [address]    WBC 04/05/2023 11.2 (H)  4.0 - 10.5 K/uL Final   RBC 04/05/2023 4.39  4.22 - 5.81 MIL/uL Final   Hemoglobin 04/05/2023 13.3  13.0 - 17.0 g/dL Final   HCT 32/44/0102 42.6  39.0 - 52.0 % Final   MCV 04/05/2023 97.0  80.0 - 100.0 fL Final   MCH 04/05/2023 30.3  26.0 - 34.0 pg Final   MCHC 04/05/2023 31.2  30.0 - 36.0 g/dL Final   RDW 72/53/6644 13.0  11.5 - 15.5 % Final   Platelets 04/05/2023 276  150 - 400 K/uL Final   nRBC 04/05/2023 0.0  0.0 - 0.2 % Final   Neutrophils Relative % 04/05/2023 61  % Final   Neutro Abs 04/05/2023 6.9  1.7 - 7.7 K/uL Final   Lymphocytes Relative 04/05/2023 26  %  Final   Lymphs Abs 04/05/2023 2.9  0.7 - 4.0 K/uL Final   Monocytes Relative 04/05/2023 9  % Final   Monocytes Absolute 04/05/2023 1.0  0.1 - 1.0 K/uL Final   Eosinophils Relative 04/05/2023 3  % Final   Eosinophils Absolute 04/05/2023 0.3  0.0 - 0.5 K/uL Final   Basophils Relative 04/05/2023 1  % Final   Basophils Absolute 04/05/2023 0.1  0.0 - 0.1 K/uL Final   Immature Granulocytes 04/05/2023 0  % Final   Abs Immature Granulocytes 04/05/2023 0.03  0.00 - 0.07 K/uL Final   Performed at [facility], [address], [place], [place] [address]   Sodium 04/05/2023 140  135 - 145 mmol/L Final   Potassium 04/05/2023 4.1  3.5 - 5.1 mmol/L Final   Chloride 04/05/2023 106  98 - 111 mmol/L Final   CO2 04/05/2023 25  22 - 32 mmol/L Final   Glucose, Bld 04/05/2023 104 (H)  70 - 99 mg/dL Final   Glucose reference range applies only to samples taken after fasting for at least 8 hours.   BUN 04/05/2023 15  8 - 23 mg/dL Final   Creatinine, Ser 04/05/2023 1.59 (H)  0.61 - 1.24 mg/dL Final   Calcium 25/95/6387 9.0  8.9 - 10.3 mg/dL Final   Total Protein 56/43/3295 7.2  6.5 - 8.1 g/dL Final   Albumin 18/84/1660 3.5  3.5 - 5.0 g/dL Final   AST 63/11/6008 22  15 - 41 U/L Final   ALT 04/05/2023 14  0 - 44 U/L  Final   Alkaline Phosphatase 04/05/2023 83  38 - 126 U/L Final   Total Bilirubin 04/05/2023 0.6  0.3 - 1.2 mg/dL Final   GFR, Estimated 04/05/2023 47 (L)  >60 mL/min Final   Comment: (NOTE) Calculated using the CKD-EPI Creatinine Equation (2021)    Anion gap 04/05/2023 9  5 - 15 Final   Performed at [facility], [address] [place], [place] [address]   Alcohol, Ethyl (B) 04/05/2023 <10  <10 mg/dL Final   Comment: (NOTE) Lowest detectable limit for serum alcohol is 10 mg/dL.  For medical purposes only. Performed at [facility], [address], [place], [place] [address]    Opiates 04/05/2023 NONE DETECTED  NONE DETECTED Final   Cocaine 04/05/2023 POSITIVE (A)  NONE  DETECTED Final   Benzodiazepines 04/05/2023 NONE DETECTED  NONE DETECTED Final   Amphetamines 04/05/2023 NONE DETECTED  NONE DETECTED Final   Tetrahydrocannabinol 04/05/2023 NONE DETECTED  NONE DETECTED Final   Barbiturates 04/05/2023 NONE DETECTED  NONE DETECTED Final   Comment: (NOTE) DRUG SCREEN FOR MEDICAL PURPOSES ONLY.  IF CONFIRMATION IS NEEDED FOR ANY PURPOSE, NOTIFY LAB WITHIN 5 DAYS.  LOWEST DETECTABLE LIMITS FOR URINE DRUG SCREEN Drug Class                     Cutoff (ng/mL) Amphetamine and metabolites    1000 Barbiturate and metabolites    200 Benzodiazepine                 200 Opiates and metabolites        300 Cocaine and metabolites        300 THC                            50 Performed at [facility] Lab, [address] [place], [place] [address]    Salicylate Lvl 04/05/2023 <7.0 (L)  7.0 - 30.0 mg/dL Final   Performed at [facility], [address], [place], [place] [address]   Troponin I (High Sensitivity) 04/05/2023 12  <18 ng/L Final   Comment: (NOTE) Elevated high sensitivity troponin I (hsTnI) values and significant  changes across serial measurements may suggest ACS but many other  chronic and acute conditions are known to elevate hsTnI results.  Refer to the "Links" section for chest pain algorithms and additional  guidance. Performed at [facility], [address] [place], [place] [address]    Troponin I (High Sensitivity) 04/05/2023 12  <18 ng/L Final   Comment: (NOTE) Elevated high sensitivity troponin I (hsTnI) values and significant  changes across serial measurements may suggest ACS but many other  chronic and acute conditions are known to elevate hsTnI results.  Refer to the "Links" section for chest pain algorithms and additional  guidance. Performed at [facility], [address], [place], [place] [address]    SARS Coronavirus 2 by RT PCR 04/05/2023 NEGATIVE  NEGATIVE Final   Performed at [facility] Lab,  [address], [place], [place] [address]  Congregational Nurse Program on 03/22/2023  Component Date Value Ref Range Status   POC Glucose 03/22/2023 140 (A)  70 - 99 mg/dl Final   Blood Alcohol level:  Lab Results  Component Value Date   ETH <10 09/15/2023   ETH <10 08/08/2023   Metabolic Disorder Labs: Lab Results  Component Value Date   HGBA1C 5.1  09/15/2023   MPG 99.67 09/15/2023   MPG 103 01/13/2023   No results found for: "PROLACTIN" Lab Results  Component Value Date   CHOL 159 09/15/2023   TRIG 53 09/15/2023   HDL 61 09/15/2023   CHOLHDL 2.6 09/15/2023   VLDL 11 09/15/2023   LDLCALC 87 09/15/2023   LDLCALC 99 01/13/2023   Therapeutic Lab Levels: No results found for: "LITHIUM" No results found for: "VALPROATE" No results found for: "CBMZ" Physical Findings   AIMS    Flowsheet Row [facility]) [facility]) [facility]  AIMS Total Score 0 0      AUDIT    Flowsheet Row Admission (Discharged) from 08/08/2023 in [facility] Admission (Discharged) from 04/06/2023 in [facility] MED[facility] Admission (Discharged) from 01/10/2023 in [facility] Admission (Discharged) from 08/11/2022 in [facility] Admission (Discharged) [facility]  Alcohol Use Disorder Identification Test Final Score (AUDIT) 29 0 13 0 7      CAGE-AID    Flowsheet Row ED to Hosp-Admission (Discharged) from 06/04/2020 in [place] [facility]  CAGE-AID Score 3      GAD-7    Flowsheet Row Office Visit from 01/11/2022 in [facility] [place] [facility] [place] Office Visit from 09/28/2021 in [facility] [place] [facility] Of [facility]. [facility] Office Visit from 02/26/2021 in  [facility] Office Visit from 12/25/2020 in [facility] Integrated [facility] from 07/16/2020 in [facility] [place] - A Dept Of [place]. [facility]  Total GAD-7 Score 7 10 11 7 14       [id number]    Flowsheet Row ED from 09/16/2023 in [facility] Clinical Support from 10/19/2022 in [facility] - A Dept Of [place]. [facility] Office Visit from 05/11/2022 in [facility] [place] - A Dept Of Eligha Bridegroom. [facility] Patient Outreach Telephone from 03/31/2022 in [facility] Patient Outreach Telephone from 01/14/2022 in [facility] Community Care Coord[facility]  PHQ-2 Total Score 1 0 2 0 1  PHQ-9 Total Score -- -- 5 -- --      Flowsheet Row ED from 09/16/2023 in [facility] ED from 09/15/2023 in [facility] Admission (Discharged) from 08/08/2023 in [facility] BEHAVIORAL MEDICINE  C-SSRS RISK CATEGORY No Risk No Risk High Risk       Musculoskeletal  Strength & Muscle Tone: within normal limits Gait & Station: normal Patient leans: N/A   Strength & Muscle Tone: within normal limits Gait & Station: unsteady, mildly unsteady Patient leans: Front  Psychiatric Specialty Exam   Presentation  General Appearance:Appropriate for Environment Eye Contact:Good Speech:Clear and Coherent Volume:Normal Handedness:Right  Mood and Affect  Mood: "curious" Affect:Congruent  Thought Process  Thought Process:Coherent Descriptions of Associations:Intact  Thought Content Suicidal Thoughts:Suicidal Thoughts: No Homicidal Thoughts:Homicidal Thoughts: No Hallucinations:Hallucinations: None Ideas of Reference:None Thought Content:Logical  Sensorium  Memory:Immediate Good, Recent Good, Remote Good Judgment:Fair Insight:Fair  Executive Functions  Orientation:Full  (Time, Place and Person) Language:Fair Concentration:Fair Attention:Fair Recall:Fair Fund of Knowledge:Fair  Psychomotor Activity  Psychomotor Activity:Psychomotor Activity: Normal  Assets  Assets:Communication Skills, Desire for Improvement, Housing  Sleep  Quality:Fair  Physical Exam  BP 116/78 (BP Location: Left Arm)   Pulse 62   Temp 98.7 F (37.1 C) (Oral)   Resp 18   SpO2 99%   Physical Exam Vitals reviewed.  Constitutional:      General: He is not in acute distress.    Appearance: He is not toxic-appearing.  HENT:     Head: Normocephalic and atraumatic.  Pulmonary:     Effort: Pulmonary effort is normal.  Skin:    General: Skin is warm and dry.  Neurological:     Mental Status: He is alert.      Assessment / Plan  Total Time spent with patient: 30 minutes Treatment Plan Summary: Daily contact with patient to assess and evaluate symptoms and progress in treatment and Medication management  Principal Problem:   Alcohol use with alcohol-induced mood disorder [facility])   Robert Chan is a 67 year old male with a past psychiatric history history of multiple inpatient hospitalizations (x3 2024, most recent in October), major depressive disorder, cocaine use disorder, alcohol use disorder and past medical history of COPD, hypertension, NSTEMI, CAD who presented to the behavioral health urgent care unaccompanied requesting detox from alcohol and cocaine. Patient continues to deny depressive symptoms. Goal is to help him get through this detox period. He declined naltrexone. While inpatient rehab is recommended for him, he requests intensive outpatient services.  He plans to pursue residential substance use treatment after the holidays.   Labs notable for Cr 1.17, AST 19, ALT 15. Lipid panel wnl. Alc <10. UDS+cocaine. QTc 437.    #Alcohol use disorder #Cocaine use disorder  -Previously discontinued ativan taper, d/c CIWA monitoring -PRN: atarax, tylenol,  maalox, imodium, milk of mg, zofran -daily MV, thiamine  -declined naltrexone -recommend abstinence from cocaine    #History of MDD #Alcohol-Induced Mood Disorder Has been noncompliant with medications -restart home abilify 5    #HTN -Continue cardizem 180 daily   #HLD -Continue lipitor 80   #COPD -Continue dulera, PRN albuterol    #Tobacco use disorder -declines NRT  #Cataracts - Lantaprost ophthalmic solution nightly    Dispo: Patient set up with CDIOP appointment on Wednesday 1pm   Signed: Karie Fetch, MD, PGY-2 09/20/2023, 11:48 AM   [facility] [address] [place], [place] [address] Dept: [phone] Dept Fax: [phone]

## 2023-09-20 NOTE — ED Notes (Signed)
Pt was provided dinner.

## 2023-09-20 NOTE — ED Notes (Signed)
Patient sitting in dayroom interacting with peers. No acute distress noted. No concerns voiced. Informed patient to notify staff with any needs or assistance. Patient verbalized understanding or agreement. Safety checks in place per facility policy.

## 2023-09-20 NOTE — Group Note (Signed)
Group Topic: Wellness  Group Date: 09/20/2023 Start Time: 1000 End Time: 1030 Facilitators: Londell Moh, NT  Department: [facility]  Number of Participants: 3  Group Focus: check in and leisure skills Treatment Modality:  Psychoeducation Interventions utilized were leisure development and patient education Purpose: increase insight  Name: Robert Chan Date of Birth: 1955/11/09  MR: [id number]    Level of Participation: active Quality of Participation: quiet Interactions with others: gave feedback Mood/Affect: appropriate Triggers (if applicable): n/a Cognition: coherent/clear Progress: Gaining insight Response: Pt was active during group, they were able to practice relaxation skills and have a positive attitude for the future. Plan: patient will be encouraged to continue to attend future groups.  Patients Problems:  Patient Active Problem List   Diagnosis Date Noted   Alcohol use with alcohol-induced mood disorder (HCC) 09/16/2023   MDD (major depressive disorder), recurrent severe, without psychosis (HCC) 08/08/2023   MDD (major depressive disorder), recurrent episode, mild (HCC) 07/12/2023   Cocaine use 05/25/2023   PAD (peripheral artery disease) (HCC) 12/01/2021   Atopic dermatitis 04/22/2021   Former tobacco use    Polysubstance abuse (HCC) 09/06/2020   Vitamin D deficiency 06/23/2020   Coronary artery disease 06/19/2020   History of non-ST elevation myocardial infarction (NSTEMI)    Foot callus 12/12/2019   Age-related nuclear cataract of right eye 09/13/2016   Benign hypertension 07/06/2016   Glaucoma 07/06/2016   Male erectile disorder 07/06/2016   Asthma 10/18/2012   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

## 2023-09-20 NOTE — ED Notes (Signed)
Patient in the bedroom calm and sleeping.   NAD.Respirations are even and unlabored. Will keep monitoring for safety.

## 2023-09-20 NOTE — ED Notes (Signed)
Pt sitting in dayroom quietly watching tv. No acute distress noted. No concerns voiced. Informed pt to notify staff with any needs or assistance. Pt verbalized understanding or agreement. Will continue to monitor for safety. 

## 2023-09-20 NOTE — ED Notes (Signed)
Patient is sleeping. Respirations equal and unlabored, skin warm and dry. No change in assessment or acuity. Routine safety checks conducted according to facility protocol. Will continue to monitor for safety.   

## 2023-09-20 NOTE — Progress Notes (Signed)
Patient was admitted for alcohol detox hence the alcohol screening.  This was done at behavioral health urgent care which is a psychiatric facility.  Patient also needed a hemoglobin A1c due to his alcohol use

## 2023-09-20 NOTE — Group Note (Signed)
Group Topic: Communication  Group Date: 09/20/2023 Start Time: 1930 End Time: 2000 Facilitators: Rae Lips B  Department: [facility]  Number of Participants: 1  Group Focus: check in Treatment Modality:  Individual Therapy and Patient-Centered Therapy Interventions utilized were support Purpose: express feelings  Name: Robert Chan Date of Birth: [birth year]  MR: [id number]    Level of Participation: Active Quality of Participation: attentive and cooperative Interactions with others: gave feedback Mood/Affect: appropriate Triggers (if applicable): NA Cognition: coherent/clear Progress: Minimal Response: NA Plan: patient will be encouraged to go to groups.   Patients Problems:  Patient Active Problem List   Diagnosis Date Noted   Alcohol use with alcohol-induced mood disorder (HCC) 09/16/2023   MDD (major depressive disorder), recurrent severe, without psychosis (HCC) 08/08/2023   MDD (major depressive disorder), recurrent episode, mild (HCC) 07/12/2023   Cocaine use 05/25/2023   PAD (peripheral artery disease) (HCC) 12/01/2021   Atopic dermatitis 04/22/2021   Former tobacco use    Polysubstance abuse (HCC) 09/06/2020   Vitamin D deficiency 06/23/2020   Coronary artery disease 06/19/2020   History of non-ST elevation myocardial infarction (NSTEMI)    Foot callus 12/12/2019   Age-related nuclear cataract of right eye 09/13/2016   Benign hypertension 07/06/2016   Glaucoma 07/06/2016   Male erectile disorder 07/06/2016   Asthma 10/18/2012   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

## 2023-09-20 NOTE — ED Notes (Signed)
Patient alert & oriented x4. Denies intent to harm self or others when asked. Denies A/VH. Patient reports pain 3/10 (headache), states it is continuous from yesterday's eye strain related headache.. No acute distress noted. Support and encouragement provided. Routine safety checks conducted per facility protocol. Encouraged patient to notify staff if any thoughts of harm towards self or others arise. Patient verbalizes understanding and agreement.

## 2023-09-20 NOTE — ED Notes (Signed)
Pt was provided breakfast.

## 2023-09-20 NOTE — Care Management (Addendum)
Error in charting.

## 2023-09-21 ENCOUNTER — Ambulatory Visit (HOSPITAL_COMMUNITY): Payer: 59

## 2023-09-21 DIAGNOSIS — F1014 Alcohol abuse with alcohol-induced mood disorder: Secondary | ICD-10-CM | POA: Diagnosis not present

## 2023-09-21 DIAGNOSIS — F1094 Alcohol use, unspecified with alcohol-induced mood disorder: Secondary | ICD-10-CM | POA: Diagnosis not present

## 2023-09-21 DIAGNOSIS — F1020 Alcohol dependence, uncomplicated: Secondary | ICD-10-CM

## 2023-09-21 DIAGNOSIS — F1420 Cocaine dependence, uncomplicated: Secondary | ICD-10-CM

## 2023-09-21 DIAGNOSIS — F1994 Other psychoactive substance use, unspecified with psychoactive substance-induced mood disorder: Secondary | ICD-10-CM

## 2023-09-21 DIAGNOSIS — F4310 Post-traumatic stress disorder, unspecified: Secondary | ICD-10-CM

## 2023-09-21 MED ORDER — ALBUTEROL SULFATE HFA 108 (90 BASE) MCG/ACT IN AERS: 2.0000 | INHALATION_SPRAY | Freq: Four times a day (QID) | RESPIRATORY_TRACT | 0 refills | Status: DC | PRN

## 2023-09-21 MED ORDER — DILTIAZEM HCL ER COATED BEADS 180 MG PO CP24: 180.0000 mg | ORAL_CAPSULE | Freq: Every day | ORAL | 0 refills | Status: DC

## 2023-09-21 MED ORDER — TRAZODONE HCL 50 MG PO TABS: 50.0000 mg | ORAL_TABLET | Freq: Every evening | ORAL | 0 refills | Status: DC | PRN

## 2023-09-21 MED ORDER — ATORVASTATIN CALCIUM 80 MG PO TABS: 80.0000 mg | ORAL_TABLET | Freq: Every day | ORAL | 0 refills | Status: DC

## 2023-09-21 MED ORDER — ARIPIPRAZOLE 5 MG PO TABS: 5.0000 mg | ORAL_TABLET | Freq: Every day | ORAL | 0 refills | Status: DC
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
## 2023-09-21 NOTE — ED Notes (Signed)
MHT went to break at 0500am - 0530am RN did not do rounds.

## 2023-09-21 NOTE — ED Notes (Signed)
Patient is sleeping. Respirations equal and unlabored, skin warm and dry. No change in assessment or acuity. Routine safety checks conducted according to facility protocol. Will continue to monitor for safety.   

## 2023-09-21 NOTE — ED Notes (Signed)
Patient is awake and alert on unit.  He is calm and pleasant without issue or complaint.  Patient is scheduled for discharge later today.  No signs of withdrawal and patient appears motivated for change.  Will monitor.

## 2023-09-21 NOTE — ED Provider Notes (Signed)
FBC/OBS ASAP Discharge Summary  Date and Time: 09/21/2023 6:56 AM  Name: Robert Chan  MRN:  [id number]   Discharge Diagnoses:  Final diagnoses:  Alcohol-induced mood disorder (HCC)  COPD with chronic bronchitis (HCC)  Moderate episode of recurrent major depressive disorder (HCC)  PAD (peripheral artery disease) (HCC)  Essential hypertension  Cocaine use disorder (HCC)  Tobacco use disorder   Subjective: Patient is seen today in his room. He reports good mood, states he is ready to be discharged. While inpatient rehab continued to be recommended to him, he continued to report that he wanted to attempt intensive outpatient services first to be able to see his family and spend the holidays at home. He reports he will consider inpatient rehab after the holidays. He continues to decline naltrexone. He denies SI/HI/AVH.   Stay Summary:  During the patient's hospitalization, patient had extensive initial psychiatric evaluation, and follow-up psychiatric evaluations every day.  Psychiatric diagnoses provided upon initial assessment:  -Alcohol use disorder -Cocaine use disorder -Substance-induced mood disorder  Patient's psychiatric medications were adjusted on admission:  -restarted on home abilify 5 -started on CIWA with PRN ativan  During the hospitalization, other adjustments were made to the patient's psychiatric medication regimen:  -did not require any PRN ativan for alcohol withdrawal  Patient's care was discussed during the interdisciplinary team meeting every day during the hospitalization.  The patient denied having side effects to prescribed psychiatric medication.  Gradually, patient started adjusting to milieu. The patient was evaluated each day by a clinical provider to ascertain response to treatment. Improvement was noted by the patient's report of decreasing symptoms, improved sleep and appetite, affect, medication tolerance, behavior, and participation in unit  programming.  Patient was asked each day to complete a self inventory noting mood, mental status, pain, new symptoms, anxiety and concerns.    Symptoms were reported as significantly decreased or resolved completely by discharge.   On day of discharge, the patient reports that their mood is stable. The patient denied having suicidal thoughts for more than 48 hours prior to discharge.  Patient denies having homicidal thoughts.  Patient denies having auditory hallucinations.  Patient denies any visual hallucinations or other symptoms of psychosis. The patient was motivated to continue taking medication with a goal of continued improvement in mental health.   Supportive psychotherapy was provided to the patient. The patient also participated in regular group therapy while hospitalized. Coping skills, problem solving as well as relaxation therapies were also part of the unit programming.  Labs were reviewed with the patient, and abnormal results were discussed with the patient.  The patient is able to verbalize their individual safety plan to this provider.  # It is recommended to the patient to continue psychiatric medications as prescribed, after discharge from the hospital.    # It is recommended to the patient to follow up with your outpatient psychiatric provider and PCP.  # It was discussed with the patient, the impact of alcohol, drugs, tobacco have been there overall psychiatric and medical wellbeing, and total abstinence from substance use was recommended the patient.ed.  # Prescriptions provided or sent directly to preferred pharmacy at discharge. Patient agreeable to plan. Given opportunity to ask questions. Appears to feel comfortable with discharge.    # In the event of worsening symptoms, the patient is instructed to call the crisis hotline, 911 and or go to the nearest ED for appropriate evaluation and treatment of symptoms. To follow-up with primary care provider for other medical  issues, concerns and or health care needs  # Patient was discharged to CDIOP appointment with a plan to follow up as noted below.  Past Psychiatric History: Patient reports a history of depression. He reports a past psychiatric hospitalization in October 2020 for and a suicide attempt "a while ago."  Per chart review patient has had multiple inpatient hospitalizations for major depressive disorder with suicidal ideations, he has had 3 within the past year.  He has had medication trials of Abilify, doxepin, Remeron, Wellbutrin.  He has been noncompliant with medications after previous discharges and with outpatient follow-up.  PCP: Storm Frisk, MD   Past Medical History:  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   Depression    Elevated serum creatinine 08/28/2019   Homelessness 06/19/2020   Homicidal ideations 05/25/2023   Hypertension    Major depressive disorder, recurrent episode, 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/11/2022   Major depressive disorder, single episode, 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/10/2022   MDD (major depressive disorder), recurrent severe, 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   NSTEMI (non-ST elevated myocardial infarction) (HCC)    Schizoaffective disorder (HCC) 11/08/2022   Status post incision and drainage 04/22/2021   Suicidal ideation 06/05/2020   Suicidal ideation 06/05/2020   Family Psychiatric History: Patient states that his 2 brothers died from alcohol and drug use.   Social History:  He lives by himself. He reports that he supports himself with disability check from [employer]. He reports that he lives in an apartment in [place]. He reports that he has been separated from his wife for a while. He  reports that he has a daughter in [place] and 6 grandchildren. He reports that his family is in [place]    EtOH:  reports current alcohol use. Began in early teens and drinking approximately 2 to 3 ounce bottles of alcohol per day.  Tobacco:  reports that he has been smoking cigarettes. He has never used smokeless tobacco.  Cannabis: Denies Opiates: Denies Stimulants: Cocaine- did not specify quantity BZO/hypnotics: Denies Seizure/DT: Denies  Treatments: rehab in [place] 8 to 9 years ago  IVDU: Denies  Tobacco Cessation:  A prescription for an FDA-approved tobacco cessation medication was offered at discharge and the patient refused  Current Medications:  Current Facility-Administered Medications  Medication Dose Route Frequency Provider Last Rate Last Admin   acetaminophen (TYLENOL) tablet 650 mg  650 mg Oral Q6H PRN White, Patrice L, NP   650 mg at 09/20/23 0943   albuterol (VENTOLIN HFA) 108 (90 Base) MCG/ACT inhaler 2 puff  2 puff Inhalation Q6H PRN White, Patrice L, NP       alum & mag hydroxide-simeth (MAALOX/MYLANTA) 200-200-20 MG/5ML suspension 30 mL  30 mL Oral Q4H PRN White, Patrice L, NP       ARIPiprazole (ABILIFY) tablet 5 mg  5 mg Oral Daily White, Patrice L, NP   5 mg at 09/20/23 0940   atorvastatin (LIPITOR) tablet 80 mg  80 mg Oral Daily White, Patrice L, NP   80 mg at 09/20/23 0940   cloNIDine (CATAPRES) tablet 0.1 mg  0.1 mg Oral BID PRN White, Patrice L, NP       diltiazem (CARDIZEM CD) 24  hr capsule 180 mg  180 mg Oral Daily White, Patrice L, NP   180 mg at 09/20/23 0940   hydrOXYzine (ATARAX) tablet 50 mg  50 mg Oral TID PRN White, Patrice L, NP       latanoprost (XALATAN) 0.005 % ophthalmic solution 1 drop  1 drop Both Eyes QHS Lamar Sprinkles, MD   1 drop at 09/20/23 2117   magnesium hydroxide (MILK OF MAGNESIA) suspension 30 mL  30 mL Oral Daily PRN White, Patrice L, NP   30 mL at 09/18/23 1914   mometasone-formoterol (DULERA) 200-5 MCG/ACT inhaler 2 puff  2  puff Inhalation BID White, Patrice L, NP   2 puff at 09/20/23 2118   multivitamin with minerals tablet 1 tablet  1 tablet Oral Daily White, Patrice L, NP   1 tablet at 09/20/23 0940   nicotine (NICODERM CQ - dosed in mg/24 hours) patch 21 mg  21 mg Transdermal Q0600 White, Patrice L, NP       thiamine (VITAMIN B1) tablet 100 mg  100 mg Oral Daily White, Patrice L, NP   100 mg at 09/20/23 0940   traZODone (DESYREL) tablet 50 mg  50 mg Oral QHS PRN White, Patrice L, NP   50 mg at 09/20/23 2116   Current Outpatient Medications  Medication Sig Dispense Refill   albuterol (VENTOLIN HFA) 108 (90 Base) MCG/ACT inhaler Inhale 2 puffs into the lungs every 6 (six) hours as needed for shortness of breath. 18 g 1   ARIPiprazole (ABILIFY) 5 MG tablet Take 1 tablet (5 mg total) by mouth daily. 30 tablet 0   atorvastatin (LIPITOR) 80 MG tablet Take 1 tablet (80 mg total) by mouth daily. 30 tablet 0   diltiazem (CARDIZEM CD) 180 MG 24 hr capsule Take 1 capsule (180 mg total) by mouth daily. 30 capsule 0   doxepin (SINEQUAN) 75 MG capsule Take 1 capsule (75 mg total) by mouth at bedtime. 30 capsule 0   hydrOXYzine (ATARAX) 50 MG tablet Take 1 tablet (50 mg total) by mouth 3 (three) times daily as needed for anxiety. 30 tablet 0   latanoprost (XALATAN) 0.005 % ophthalmic solution Place 1 drop into both eyes at bedtime. 2.5 mL 12   mometasone-formoterol (DULERA) 200-5 MCG/ACT AERO Inhale 2 puffs into the lungs 2 (two) times daily. 1 each 1   traZODone (DESYREL) 50 MG tablet Take 1 tablet (50 mg total) by mouth at bedtime as needed for sleep. 30 tablet 0    PTA Medications:  Facility Ordered Medications  Medication   acetaminophen (TYLENOL) tablet 650 mg   albuterol (VENTOLIN HFA) 108 (90 Base) MCG/ACT inhaler 2 puff   alum & mag hydroxide-simeth (MAALOX/MYLANTA) 200-200-20 MG/5ML suspension 30 mL   ARIPiprazole (ABILIFY) tablet 5 mg   atorvastatin (LIPITOR) tablet 80 mg   cloNIDine (CATAPRES) tablet 0.1 mg    diltiazem (CARDIZEM CD) 24 hr capsule 180 mg   hydrOXYzine (ATARAX) tablet 50 mg   [EXPIRED] loperamide (IMODIUM) capsule 2-4 mg   [EXPIRED] LORazepam (ATIVAN) tablet 1 mg   magnesium hydroxide (MILK OF MAGNESIA) suspension 30 mL   mometasone-formoterol (DULERA) 200-5 MCG/ACT inhaler 2 puff   multivitamin with minerals tablet 1 tablet   nicotine (NICODERM CQ - dosed in mg/24 hours) patch 21 mg   [EXPIRED] ondansetron (ZOFRAN-ODT) disintegrating tablet 4 mg   thiamine (VITAMIN B1) tablet 100 mg   traZODone (DESYREL) tablet 50 mg   latanoprost (XALATAN) 0.005 % ophthalmic solution 1 drop   PTA  Medications  Medication Sig   atorvastatin (LIPITOR) 80 MG tablet Take 1 tablet (80 mg total) by mouth daily.   diltiazem (CARDIZEM CD) 180 MG 24 hr capsule Take 1 capsule (180 mg total) by mouth daily.   ARIPiprazole (ABILIFY) 5 MG tablet Take 1 tablet (5 mg total) by mouth daily.   doxepin (SINEQUAN) 75 MG capsule Take 1 capsule (75 mg total) by mouth at bedtime.   hydrOXYzine (ATARAX) 50 MG tablet Take 1 tablet (50 mg total) by mouth 3 (three) times daily as needed for anxiety.   traZODone (DESYREL) 50 MG tablet Take 1 tablet (50 mg total) by mouth at bedtime as needed for sleep.   albuterol (VENTOLIN HFA) 108 (90 Base) MCG/ACT inhaler Inhale 2 puffs into the lungs every 6 (six) hours as needed for shortness of breath.   mometasone-formoterol (DULERA) 200-5 MCG/ACT AERO Inhale 2 puffs into the lungs 2 (two) times daily.   latanoprost (XALATAN) 0.005 % ophthalmic solution Place 1 drop into both eyes at bedtime.       09/21/2023    6:56 AM 09/16/2023    2:19 PM 10/19/2022    2:53 PM  Depression screen PHQ 2/9  Decreased Interest 0 0 0  Down, Depressed, Hopeless 0 1 0  PHQ - 2 Score 0 1 0    Flowsheet Row ED from 09/16/2023 in [facility] ED from 09/15/2023 in [facility] Admission (Discharged) from [facility]08/[facility]  BEHAVIORAL MEDICINE  C-SSRS RISK CATEGORY No Risk No Risk High Risk       Musculoskeletal  Strength & Muscle Tone: within normal limits Gait & Station: normal Patient leans: N/A  Psychiatric Specialty Exam  Presentation  General Appearance:  Appropriate for Environment  Eye Contact: Good  Speech: Blocked  Speech Volume: Normal  Handedness: Right   Mood and Affect  Mood: Euthymic  Affect: Congruent   Thought Process  Thought Processes: Coherent  Descriptions of Associations:Intact  Orientation:Full (Time, Place and Person)  Thought Content:Logical  Diagnosis of Schizophrenia or Schizoaffective disorder in past: No    Hallucinations: None Ideas of Reference:None  Suicidal Thoughts: None Homicidal Thoughts: None  Sensorium  Memory: Immediate Good  Judgment: Fair  Insight: Fair   Art therapist  Concentration: Good  Attention Span: Good  Recall: Good  Fund of Knowledge: Good  Language: Good   Psychomotor Activity  Psychomotor Activity: Normal  Assets  Assets: Communication Skills; Desire for Improvement; Social Support; Housing   Sleep  Sleep: Fair  Physical Exam  Constitutional:      Appearance: the patient is not toxic-appearing.  Pulmonary:     Effort: Pulmonary effort is normal.  Neurological:     General: No focal deficit present.     Mental Status: the patient is alert and oriented to person, place, and time.   Review of Systems  Respiratory:  Negative for shortness of breath.   Cardiovascular:  Negative for chest pain.  Gastrointestinal:  Negative for abdominal pain, constipation, diarrhea, nausea and vomiting.  Neurological:  Negative for headaches.   Blood pressure (!) 130/54, pulse 64, temperature 98.3 F (36.8 C), temperature source Oral, resp. rate 18, SpO2 96%. There is no height or weight on file to calculate BMI.  Demographic Factors:  Male, Age 23 or older, Low socioeconomic status, and  Living alone  Loss Factors: NA  Historical Factors: Family history of mental illness or substance abuse and Impulsivity  Risk Reduction Factors:   Sense  of responsibility to family and Positive social support  Continued Clinical Symptoms:  Alcohol/Substance Abuse/Dependencies Previous Psychiatric Diagnoses and Treatments  Cognitive Features That Contribute To Risk:  Thought constriction (tunnel vision)    Suicide Risk:  Mild: There are no identifiable plans, no associated intent, mild dysphoria and related symptoms, good self-control (both objective and subjective assessment), few other risk factors, and identifiable protective factors, including available and accessible social support.  Plan Of Care/Follow-up recommendations:  Activity: as tolerated  Diet: heart healthy  Other: -Follow-up with your outpatient providers   -Take your psychiatric medications as prescribed at discharge - instructions are provided to you in the discharge paperwork  -Follow-up with outpatient primary care doctor and other specialists -for management of preventative medicine and chronic medical disease  -If you are prescribed an atypical antipsychotic medication, we recommend that your outpatient psychiatrist follow routine screening for side effects within 3 months of discharge, including monitoring: AIMS scale, height, weight, blood pressure, fasting lipid panel, HbA1c, and fasting blood sugar.   -Recommend total abstinence from alcohol, tobacco, and other illicit drug use at discharge.   -If your psychiatric symptoms recur, worsen, or if you have side effects to your psychiatric medications, call your outpatient psychiatric provider, 911, [phone] or go to the nearest emergency department.  -If suicidal thoughts occur, immediately call your outpatient psychiatric provider, 911, [phone] or go to the nearest emergency department.   Disposition: CDIOP appointment today at 1pm, then home   Karie Fetch,  MD 09/21/2023, 6:56 AM

## 2023-09-21 NOTE — Discharge Instructions (Addendum)
Based on the information that you have provided and the presenting issues outpatient services and resources for have been recommended.  It is imperative that you follow through with treatment recommendations within 5-7 days from the of discharge to mitigate further risk to your safety and mental well-being. A list of referrals has been provided below to get you started.  You are not limited to the list provided.  In case of an urgent crisis, you may contact the Mobile Crisis Unit with [facility] at [phone].               CD_IOP Program in [facility]  You have an appointment with [facility] on Wednesday 09-21-2023 at 1pm on the 2nd floor with Laney Potash for an intake assessment.   [facility] 2nd Floor  [address]  [place]  [phone]   [facility] [address] [place], [place] [address]  [phone]    [facility] [address] [place], [place] [address]  [phone]    [facility] [address]  [place], [place] [address] [phone]    [facility] [address]  [place], [place] [address] [phone]    [facility]  [address] [place], [address] [phone]   [facility] [address] [place], [place] [address] [phone] (Private Pay only)   [facility] [address] [place], [place], [address] [phone]   [facility] of the [place] [address] [place], [place], [address] [phone]   [facility] [address], [address] [place], [place], [address] [phone]

## 2023-09-21 NOTE — Group Note (Signed)
Group Topic: Overcoming Obstacles  Group Date: 09/21/2023 Start Time: 1000 End Time: 1104 Facilitators: Vonzell Schlatter B  Department: [facility]  Number of Participants: 5  Group Focus: activities of daily living skills and affirmation Treatment Modality:  Psychoeducation Interventions utilized were problem solving and support Purpose: increase insight and reinforce self-care  Name: Robert Chan Date of Birth: 01-27-1956  MR: [id number]    Level of Participation: active Quality of Participation: attentive and cooperative Interactions with others: gave feedback Mood/Affect: positive Triggers (if applicable): n/a Cognition: coherent/clear Progress: Moderate Response: ready for outpatient recovery upstairs and a new start  Plan: follow-up needed  Patients Problems:  Patient Active Problem List   Diagnosis Date Noted   Alcohol use with alcohol-induced mood disorder (HCC) 09/16/2023   MDD (major depressive disorder), recurrent severe, without psychosis (HCC) 08/08/2023   MDD (major depressive disorder), recurrent episode, mild (HCC) 07/12/2023   Cocaine use 05/25/2023   PAD (peripheral artery disease) (HCC) 12/01/2021   Atopic dermatitis 04/22/2021   Former tobacco use    Polysubstance abuse (HCC) 09/06/2020   Vitamin D deficiency 06/23/2020   Coronary artery disease 06/19/2020   History of non-ST elevation myocardial infarction (NSTEMI)    Foot callus 12/12/2019   Age-related nuclear cataract of right eye 09/13/2016   Benign hypertension 07/06/2016   Glaucoma 07/06/2016   Male erectile disorder 07/06/2016   Asthma 10/18/2012   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

## 2023-09-27 ENCOUNTER — Encounter (HOSPITAL_COMMUNITY): Payer: Self-pay | Admitting: Licensed Clinical Social Worker

## 2023-09-27 ENCOUNTER — Ambulatory Visit (HOSPITAL_COMMUNITY): Payer: 59 | Admitting: Licensed Clinical Social Worker

## 2023-09-27 DIAGNOSIS — Z419 Encounter for procedure for purposes other than remedying health state, unspecified: Secondary | ICD-10-CM

## 2023-09-27 LAB — GLUCOSE, POCT (MANUAL RESULT ENTRY): POC Glucose: 102 mg/dL — AB (ref 70–99)

## 2023-09-27 NOTE — Congregational Nurse Program (Signed)
  Dept: [phone]   Congregational Nurse Program Note  Date of Encounter: 09/27/2023  Clinic visit to check blood pressure and blood glucose.  States he has been walking more and eating better to manage blood glucose.  BP 117/82, pulse 78 and regular, O2 99%.  Weight 256 Lbs, has lost a few pounds, trying to stay below 260 Lbs.  Blood glucose 102, AC lunch.  States he is taking medications as prescribed and has gone for behavioral health appointments. Reminded to make appointment with PCP office, explained process since his assigned PCP at the practice has retired.  Past Medical History: 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   Depression    Elevated serum creatinine 08/28/2019   Homelessness 06/19/2020   Homicidal ideations 05/25/2023   Hypertension    Major depressive disorder, recurrent episode, 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/11/2022   Major depressive disorder, single episode, 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/10/2022   MDD (major depressive disorder), recurrent severe, 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   NSTEMI (non-ST elevated myocardial infarction) (HCC)    Schizoaffective disorder (HCC) 11/08/2022   Status post incision and drainage 04/22/2021   Suicidal ideation 06/05/2020   Suicidal ideation 06/05/2020    Encounter Details:  Community Questionnaire - 09/27/23 1458       Questionnaire   Ask client: Do you give verbal consent for me to treat you today? Yes    Student Assistance N/A    Location [place]    Encounter Setting CN site    Population Status Unknown   Has apartment at [facility]    Insurance/Financial Assistance Referral N/A    Medication N/A     Medical Provider Yes    Screening Referrals Made N/A    Medical Referrals Made [facility] PCP/Clinic    Medical Appointment Completed N/A    CNP Interventions Advocate/Support;Counsel;Educate    Screenings CN Performed Blood Pressure;Blood Glucose;Weight    ED Visit Averted N/A    Life-Saving Intervention Made N/A

## 2023-10-10 ENCOUNTER — Other Ambulatory Visit: Payer: Self-pay

## 2023-10-10 ENCOUNTER — Emergency Department (HOSPITAL_COMMUNITY): Payer: 59

## 2023-10-10 ENCOUNTER — Observation Stay (HOSPITAL_COMMUNITY)
Admission: EM | Admit: 2023-10-10 | Discharge: 2023-10-11 | Disposition: A | Discharge status: Other Acute Inpatient Hospital | Payer: 59 | Attending: Internal Medicine | Admitting: Internal Medicine

## 2023-10-10 ENCOUNTER — Encounter (HOSPITAL_COMMUNITY): Payer: Self-pay

## 2023-10-10 DIAGNOSIS — I2510 Atherosclerotic heart disease of native coronary artery without angina pectoris: Secondary | ICD-10-CM | POA: Insufficient documentation

## 2023-10-10 DIAGNOSIS — N2889 Other specified disorders of kidney and ureter: Secondary | ICD-10-CM | POA: Diagnosis not present

## 2023-10-10 DIAGNOSIS — N179 Acute kidney failure, unspecified: Secondary | ICD-10-CM | POA: Diagnosis not present

## 2023-10-10 DIAGNOSIS — Z1152 Encounter for screening for COVID-19: Secondary | ICD-10-CM | POA: Insufficient documentation

## 2023-10-10 DIAGNOSIS — R079 Chest pain, unspecified: Secondary | ICD-10-CM | POA: Diagnosis present

## 2023-10-10 DIAGNOSIS — F17210 Nicotine dependence, cigarettes, uncomplicated: Secondary | ICD-10-CM | POA: Diagnosis not present

## 2023-10-10 DIAGNOSIS — I10 Essential (primary) hypertension: Secondary | ICD-10-CM | POA: Diagnosis not present

## 2023-10-10 DIAGNOSIS — Z79899 Other long term (current) drug therapy: Secondary | ICD-10-CM | POA: Insufficient documentation

## 2023-10-10 DIAGNOSIS — R0602 Shortness of breath: Secondary | ICD-10-CM | POA: Diagnosis not present

## 2023-10-10 DIAGNOSIS — F1490 Cocaine use, unspecified, uncomplicated: Secondary | ICD-10-CM | POA: Insufficient documentation

## 2023-10-10 DIAGNOSIS — J449 Chronic obstructive pulmonary disease, unspecified: Secondary | ICD-10-CM | POA: Insufficient documentation

## 2023-10-10 DIAGNOSIS — N289 Disorder of kidney and ureter, unspecified: Secondary | ICD-10-CM

## 2023-10-10 DIAGNOSIS — R45851 Suicidal ideations: Secondary | ICD-10-CM | POA: Insufficient documentation

## 2023-10-10 LAB — COMPREHENSIVE METABOLIC PANEL
ALT: 14 U/L (ref 0–44)
AST: 20 U/L (ref 15–41)
Albumin: 3.5 g/dL (ref 3.5–5.0)
Alkaline Phosphatase: 72 U/L (ref 38–126)
Anion gap: 9 (ref 5–15)
BUN: 13 mg/dL (ref 8–23)
CO2: 20 mmol/L — ABNORMAL LOW (ref 22–32)
Calcium: 8.8 mg/dL — ABNORMAL LOW (ref 8.9–10.3)
Chloride: 108 mmol/L (ref 98–111)
Creatinine, Ser: 2.09 mg/dL — ABNORMAL HIGH (ref 0.61–1.24)
GFR, Estimated: 34 mL/min — ABNORMAL LOW (ref >60)
Glucose, Bld: 113 mg/dL — ABNORMAL HIGH (ref 70–99)
Potassium: 3.6 mmol/L (ref 3.5–5.1)
Sodium: 137 mmol/L (ref 135–145)
Total Bilirubin: 0.8 mg/dL (ref <1.2)
Total Protein: 7.3 g/dL (ref 6.5–8.1)

## 2023-10-10 LAB — CBC
HCT: 40.4 % (ref 39.0–52.0)
Hemoglobin: 13 g/dL (ref 13.0–17.0)
MCH: 30.2 pg (ref 26.0–34.0)
MCHC: 32.2 g/dL (ref 30.0–36.0)
MCV: 94 fL (ref 80.0–100.0)
Platelets: 289 10*3/uL (ref 150–400)
RBC: 4.3 MIL/uL (ref 4.22–5.81)
RDW: 13.2 % (ref 11.5–15.5)
WBC: 7.7 10*3/uL (ref 4.0–10.5)
nRBC: 0 % (ref 0.0–0.2)

## 2023-10-10 LAB — ACETAMINOPHEN LEVEL: Acetaminophen (Tylenol), Serum: <10 ug/mL — ABNORMAL LOW (ref 10–30)

## 2023-10-10 LAB — RAPID URINE DRUG SCREEN, HOSP PERFORMED
Amphetamines: NOT DETECTED
Barbiturates: NOT DETECTED
Benzodiazepines: NOT DETECTED
Cocaine: POSITIVE — AB
Opiates: NOT DETECTED
Tetrahydrocannabinol: NOT DETECTED

## 2023-10-10 LAB — SALICYLATE LEVEL: Salicylate Lvl: <7 mg/dL — ABNORMAL LOW (ref 7.0–30.0)

## 2023-10-10 LAB — ETHANOL: Alcohol, Ethyl (B): <10 mg/dL (ref <10)

## 2023-10-10 LAB — TROPONIN I (HIGH SENSITIVITY): Troponin I (High Sensitivity): 11 ng/L (ref <18)

## 2023-10-10 MED ORDER — ACETAMINOPHEN 650 MG RE SUPP: 650.0000 mg | Freq: Four times a day (QID) | RECTAL | Status: DC | PRN

## 2023-10-10 MED ORDER — PANTOPRAZOLE SODIUM 20 MG PO TBEC
20.0000 mg | DELAYED_RELEASE_TABLET | Freq: Every day | ORAL | Status: DC
  Administered 2023-10-10 – 2023-10-11 (×2): 20 mg via ORAL
  Filled 2023-10-10 (×2): qty 1

## 2023-10-10 MED ORDER — ACETAMINOPHEN 500 MG PO TABS
1000.0000 mg | ORAL_TABLET | Freq: Once | ORAL | Status: AC
  Administered 2023-10-10: 1000 mg via ORAL
  Filled 2023-10-10: qty 2

## 2023-10-10 MED ORDER — SENNOSIDES-DOCUSATE SODIUM 8.6-50 MG PO TABS: 1.0000 | ORAL_TABLET | Freq: Every evening | ORAL | Status: DC | PRN

## 2023-10-10 MED ORDER — LATANOPROST 0.005 % OP SOLN
1.0000 [drp] | Freq: Every day | OPHTHALMIC | Status: DC
  Administered 2023-10-10: 1 [drp] via OPHTHALMIC
  Filled 2023-10-10: qty 2.5

## 2023-10-10 MED ORDER — MOMETASONE FURO-FORMOTEROL FUM 200-5 MCG/ACT IN AERO
2.0000 | INHALATION_SPRAY | Freq: Two times a day (BID) | RESPIRATORY_TRACT | Status: DC
Start: 2023-10-10 — End: 2023-10-11
  Administered 2023-10-10: 2 via RESPIRATORY_TRACT
  Filled 2023-10-10: qty 8.8

## 2023-10-10 MED ORDER — SODIUM CHLORIDE 0.9 % IV BOLUS
500.0000 mL | Freq: Once | INTRAVENOUS | Status: AC
  Administered 2023-10-10: 500 mL via INTRAVENOUS

## 2023-10-10 MED ORDER — ATORVASTATIN CALCIUM 80 MG PO TABS
80.0000 mg | ORAL_TABLET | Freq: Every day | ORAL | Status: DC
  Administered 2023-10-10 – 2023-10-11 (×2): 80 mg via ORAL
  Filled 2023-10-10 (×2): qty 1

## 2023-10-10 MED ORDER — ACETAMINOPHEN 325 MG PO TABS: 650.0000 mg | ORAL_TABLET | Freq: Four times a day (QID) | ORAL | Status: DC | PRN

## 2023-10-10 MED ORDER — LIDOCAINE 5 % EX PTCH
1.0000 | MEDICATED_PATCH | CUTANEOUS | Status: DC
  Filled 2023-10-10: qty 1

## 2023-10-10 MED ORDER — RIVAROXABAN 10 MG PO TABS
10.0000 mg | ORAL_TABLET | Freq: Every day | ORAL | Status: DC
  Administered 2023-10-10 – 2023-10-11 (×2): 10 mg via ORAL
  Filled 2023-10-10 (×2): qty 1

## 2023-10-10 NOTE — ED Notes (Signed)
Patient has been wanded by security.  

## 2023-10-10 NOTE — ED Notes (Signed)
Pt. Changed into purple scrubs. Pt. Belongings put in locker 7 in purple. Pt. Moved [address]

## 2023-10-10 NOTE — H&P (Signed)
Date: 10/10/2023               Patient Name:  Robert Chan MRN: [id number]  DOB: 02/15/1956 Age / Sex: 67 y.o., male   PCP: Storm Frisk, MD         Medical Service: Internal Medicine Teaching Service         Attending Physician: Dr. Blane Ohara, MD      First Contact: Dr. Jeral Pinch, DO Pager [phone]    Second Contact: Dr. Rana Snare, DO Pager [phone]         After Hours (After 5p/  First Contact Pager: [phone]  weekends / holidays): Second Contact Pager: [phone]   SUBJECTIVE   Chief Complaint: "SI/HI and CP x 2 days"   History of Present Illness:   This is a 67 year old male past medical history of atherosclerotic CAD, COPD, MDD without psychosis, hypertension, hx of cocaine use, history of NSTEMI 06/2020, underwent Left cardiac cath showed 30% prox LAD stenosis and 20% mid RCA stenosis presented with 2 days of chest pain and week of SI/HI.   Patient reports his symptoms of chest pain started couple of days ago, describes it as a pressure-like pain that is localized to his right mid chest that comes and goes, denies any numbness, weakness, tingling sensation of the bilateral upper arms. Denies any radiation to arms/shoulder. Reports intermittent dizziness, and SOB with exertion, attributes it to his COPD. Endorses symptoms of acid reflux, states burning sensation in the back of throat. Denies any nausea or vomiting. Denies any pleuritic chest pain. Denies any trauma or falls.  Denies any recent travels, recent long car rides, hemoptysis, lower extremity pain or swelling.   In addition to above, patient endorses a week of suicidal ideations and homicidal ideations. Reports it has been ongoing for months, states he was previously hospitalized for that. He denies any active plan currently, continues to endorse SI. He reports HI, states he does not like his daughter's boyfriend, no clear plan.   Otherwise denies any abdominal pain, diarrhea and urinary symptoms. No  sick contacts.   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   ED Course: Psychiatrist consulted in ED.   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 Hx Cocaine use Alcohol use disorder  Past Surgical History:  Procedure Laterality Date   APPENDECTOMY     EYE SURGERY     LEFT HEART CATH AND CORONARY ANGIOGRAPHY N/A 06/05/2020   Procedure: LEFT HEART CATH AND CORONARY ANGIOGRAPHY;  Surgeon: Elder Negus, MD;  Location: [facility];  Service: Cardiovascular;  Laterality: N/A;   PROSTATE SURGERY      Social:  Lives in [place] Support: Children in [place] Level of Function: Independent ADLs/ iADLS PCP: Storm Frisk, MD Substances: Reports he is occasionally smokes cigarettes, denies any recent alcohol use, denies any recent cocaine use.  Reports last time he smoked cocaine was a month ago.  Allergies: Allergies as of 10/10/2023 - Review Complete 10/10/2023  Allergen Reaction Noted   Shellfish allergy Hives and Rash 06/04/2020    Review of Systems: A complete ROS was negative except as per HPI.   OBJECTIVE:   Physical Exam:  Blood pressure (!) 140/81, pulse 60, temperature 98.1 F (36.7 C), resp. rate 16, height 6\' 4"  (1.93 m), weight 113.4 kg, SpO2 99%.   General: Patient is resting comfortably in bed in no acute distress  Head: Normocephalic, atraumatic  Cardio: Regular rate and rhythm, no murmurs, rubs or gallops. 2+ pulses to bilateral upper and lower extremities  Chest: No chest tenderness noted upon palpation  Pulmonary: Clear to ausculation bilaterally with no rales, rhonchi, and crackles  Abdomen: Soft,  nontender with normoactive bowel sounds with no rebound or guarding   MSK: 5/5 strength to upper and lower extremities.   Neuro: Alert and oriented x3, no focal deficits  Psych: +SI/HI  Labs: CBC    Component Value Date/Time   WBC 7.7 10/10/2023 1004   RBC 4.30 10/10/2023 1004   HGB 13.0 10/10/2023 1004   HGB 12.7 (L) 01/19/2022 1047   HCT 40.4 10/10/2023 1004   HCT 36.9 (L) 01/19/2022 1047   PLT 289 10/10/2023 1004   PLT 257 01/19/2022 1047   MCV 94.0 10/10/2023 1004   MCV 90 01/19/2022 1047   MCH 30.2 10/10/2023 1004   MCHC 32.2 10/10/2023 1004   RDW 13.2 10/10/2023 1004   RDW 12.0 01/19/2022 1047   LYMPHSABS 3.2 09/15/2023 1652   LYMPHSABS 3.0 09/28/2021 1514   MONOABS 0.7 09/15/2023 1652   EOSABS 0.3 09/15/2023 1652   EOSABS 0.3 09/28/2021 1514   BASOSABS 0.1 09/15/2023 1652   BASOSABS 0.1 09/28/2021 1514     CMP     Component Value Date/Time   NA 137 10/10/2023 1004   NA 138 01/19/2022 1047   K 3.6 10/10/2023 1004   CL 108 10/10/2023 1004   CO2 20 (L) 10/10/2023 1004   GLUCOSE 113 (H) 10/10/2023 1004   BUN 13 10/10/2023 1004   BUN 12 01/19/2022 1047   CREATININE 2.09 (H) 10/10/2023 1004   CALCIUM 8.8 (L) 10/10/2023 1004   PROT 7.3 10/10/2023 1004   PROT 7.2 05/17/2022 1047   ALBUMIN 3.5 10/10/2023 1004   ALBUMIN 3.9 05/17/2022 1047   AST 20 10/10/2023 1004   ALT 14 10/10/2023 1004   ALKPHOS 72 10/10/2023 1004   BILITOT 0.8 10/10/2023 1004   BILITOT 0.6 05/17/2022 1047   GFRNONAA 34 (L) 10/10/2023 1004   GFRAA >60 07/08/2020 0908    Imaging: CXR IMPRESSION: No active cardiopulmonary disease.  EKG: personally reviewed my interpretation is sinus rate, sinus rhythm, normal PR interval, mild QTc prolongation 468, low voltage EKG noted similar to previous EKG findings.   ASSESSMENT & PLAN:   Assessment & Plan by Problem: Active Problems:   * No active hospital problems. *   Robert Chan is a 67 y.o. 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  On hospital day 0  #Chest Pain  #Mild Non-obstructive CAD #Hx of cocaine use #Hx of coronary vasospasm Presented with 2 days of right mid chest pain, pressure-like, that comes and goes; denies any numbness or weakness b/l UE. Reports previous history of similar symptoms. He has a previous history of NSTEMI on 06/2020, underwent left heart catheterization that showed 30% proximal LAD stenosis and 20% mild RCA stenosis; recently underwent CT coronary 01/2023 that showed mild nonobstructive CAD. EKG negative for ST elevation or  depression. Questionable history of whether he is taking Cardizem; per chart review, note from 12/2020 noted to change amlodipine to Cardizem to 40 mg daily to help reduce myocardial bridging, last dispensed 12/2021. Denies any active cocaine use, last used a month ago. ROS positive for acid reflux. No n/v/d. No pleuritic CP or acute SOB. No chest wall trauma or falls. Labs reassuring, troponin 11.  Ethanol negative.  Salicylate and acetaminophen negative. Low suspicion for ACS w/ negative trop. CXR negative cardiopulmonary process.    Plan: -Follow up on urine tox -Start Protonix 20 mg once daily for acid reflux -Tylenol 650 mg every 6 hours as needed for pain -Lidoderm patch as needed for pain  -Continue Lipitor 80 mg daily  -Recommend OP f/u with cardiology   #Active SI/HI #MDD w/o psychosis  Patient reports thoughts of suicidal ideations and homicidal ideations. States it has been ongoing for a week, reports that he was previously hospitalized for similar symptoms and was treated inpatient. He currently denies any active plans to hurt himself or others. He did report that he does not like his daughter's boyfriend.  Home  medications consist of Abilify and trazodone, unclear if he is taking these medications, last dispensed Abilify was on 05/31/2023 and trazodone was last dispensed on 05/18/2021. Psychiatry is consulted, patient is willing to be treated for his symptoms.  - Psych consulted - One to one sitter   AKI Presented with Cr 2.09, baseline around 1.1, denies any nausea/vomiting/diarrhea. Denies any decrease PO intake. Denies any urinary symptoms or obstruction. Unclear etiology at this time. No acute electrolyte abnormalities noted. Patient is s/p 500 mL liter NS, will trend Cr.  - trend Cr   Chronic Conditions Asthma/COPD: No wheezing.  Continue home medication Dulera inhaler.  Diet:  cardiac  VTE:  Xarelto IVF: None,None Code: Full  Prior to Admission Living Arrangement: Home, living self Anticipated Discharge Location: Home Barriers to Discharge: medical management  Dispo: Admit patient to Observation with expected length of stay less than 2 midnights.  Signed: Jeral Pinch, DO Internal Medicine Resident PGY-1  10/10/2023, 2:01 PM

## 2023-10-10 NOTE — Hospital Course (Signed)
Robert Chan is a 67 y.o. 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  On hospital day 0   #Chest Pain  #Mild Non-obstructive CAD #Hx of cocaine use #Hx of coronary vasospasm Presented with 2 days of right mid chest pain, pressure-like, that comes and goes; denies any numbness or weakness b/l UE. Reports previous history of similar symptoms. He has a previous history of NSTEMI on 06/2020, underwent left heart catheterization that showed 30% proximal LAD stenosis and 20% mild RCA stenosis; recently underwent CT coronary 01/2023 that showed mild nonobstructive CAD. EKG negative for ST elevation or depression. Questionable history of whether he is taking Cardizem; per chart review, note from 12/2020 noted to change amlodipine to Cardizem to 40 mg daily to help reduce myocardial bridging, last dispensed 12/2021. Denies any active cocaine use, last used a month ago. ROS positive for acid reflux. No n/v/d. No pleuritic CP or acute SOB. No chest wall trauma or falls. Labs reassuring, troponin 11.  Ethanol negative.  Salicylate and acetaminophen negative. Low suspicion for ACS w/ negative trop. CXR negative cardiopulmonary process. Urine tox positive for cocaine. He was given protonix 20 mg daily for acid reflux. I highly recommend this patient to follow up with cardiology with concerns about his medications. Last dispense history valsartan and amlodipine was 2023; I am sure if he is still taking these medications. His pain score was 0 this morning. Per chart review, he has had multiple ED visits for CP with concurrent cocaine use. His presenting symptom this visit is highly attributed to the continuous cocaine use.     #Active SI/HI #MDD w/o psychosis  Patient reports  thoughts of suicidal ideations and homicidal ideations. States it has been ongoing for a week, reports that he was previously hospitalized for similar symptoms and was treated inpatient. He currently denies any active plans to hurt himself or others. He did report that he does not like his daughter's boyfriend.  Home medications consist of Abilify and trazodone, unclear if he is taking these medications, last dispensed Abilify was on 05/31/2023 and trazodone was last dispensed on 05/18/2021. Psychiatry is consulted, patient is willing to be treated for his symptoms. Psych consulted, will be transferred to a [facility] facility.    AKI - resolved.  Presented with Cr 2.09, baseline around 1.1, denies any nausea/vomiting/diarrhea. Denies any decrease PO intake. Denies any urinary symptoms or obstruction. Unclear etiology at this time. No acute electrolyte abnormalities noted. Patient is s/p 500 mL liter NS, Cr checked today was 1.17, back to baseline.    Chronic Conditions Asthma/COPD: No wheezing.  Continue home medication Dulera inhaler.    ------------------------------------------------  Mr. Hoerig,  You can to the hospital concerns for chest pain and active thoughts of suicidal ideation/homicidal ideation.  For your chest pain: -I advise you to quit smoking cocaine, it is causing the chest pain that you are having. -You need to follow-up with a heart doctor about your medications. -You can continue taking diltiazem 180 mg, 1 tablet by mouth daily.  For your active suicidal/homicidal ideation -He will be admitted to behavioral health -Please take the medications that they give you upon discharge.  Otherwise he can continue taking your inhaler and your eyedrops.  If you have any of these following  symptoms, please call us or seek care at an emergency department: -Chest Pain -Difficulty Breathing -Worsening abdominal pain -Syncope (passing out) -Drooping of face -Slurred speech -Sudden  weakness in your leg or arm -Fever -Chills -blood in the stool -dark black, sticky stool  I am glad you are feeling better. It was a pleasure taking care for you. I wish a good recovery and good health!   Dr. Jeral Pinch

## 2023-10-10 NOTE — ED Provider Notes (Signed)
[place] EMERGENCY DEPARTMENT AT [facility] Provider Note   CSN: [id number] Arrival date & time: 10/10/23  0940     History {Add pertinent medical, surgical, social history, OB history to HPI:1} Chief Complaint  Patient presents with   Chest Pain   Psychiatric Evaluation    Robert Chan is a 67 y.o. male.  Patient presents with 2 concerns chest pain and suicidal and homicidal thoughts.  Patient's had fairly constant chest pain the past few days.  Patient denies any heart attack or stent history or recent workup for his heart.  Patient has history of asthma, COPD, CAD, cocaine use recently, coronary vasospasm and schizoaffective disorder.  Patient said he was admitted for behavioral health reasons in October.  Patient said he is from [place] but currently renting in [place].  Patient cannot remember the name of mental health facility he was there last.  Patient currently wants help.  The history is provided by the patient.  Chest Pain Associated symptoms: no abdominal pain, no back pain, no fever, no headache, no shortness of breath and no vomiting        Home Medications Prior to Admission medications   Medication Sig Start Date End Date Taking? Authorizing Provider  albuterol (VENTOLIN HFA) 108 (90 Base) MCG/ACT inhaler Inhale 2 puffs into the lungs every 6 (six) hours as needed for shortness of breath. 09/21/23   Karie Fetch, MD  ARIPiprazole (ABILIFY) 5 MG tablet Take 1 tablet (5 mg total) by mouth daily. 09/21/23 10/21/23  Karie Fetch, MD  atorvastatin (LIPITOR) 80 MG tablet Take 1 tablet (80 mg total) by mouth daily. 09/21/23 10/21/23  Karie Fetch, MD  diltiazem (CARDIZEM CD) 180 MG 24 hr capsule Take 1 capsule (180 mg total) by mouth daily. 09/21/23 10/21/23  Karie Fetch, MD  latanoprost (XALATAN) 0.005 % ophthalmic solution Place 1 drop into both eyes at bedtime. 08/22/23   Lewanda Rife, MD  mometasone-formoterol (DULERA) 200-5  MCG/ACT AERO Inhale 2 puffs into the lungs 2 (two) times daily. 08/22/23   Lewanda Rife, MD  traZODone (DESYREL) 50 MG tablet Take 1 tablet (50 mg total) by mouth at bedtime as needed for sleep. 09/21/23 10/21/23  Karie Fetch, MD      Allergies    Shellfish allergy    Review of Systems   Review of Systems  Constitutional:  Negative for chills and fever.  HENT:  Negative for congestion.   Eyes:  Negative for visual disturbance.  Respiratory:  Negative for shortness of breath.   Cardiovascular:  Positive for chest pain.  Gastrointestinal:  Negative for abdominal pain and vomiting.  Genitourinary:  Negative for dysuria and flank pain.  Musculoskeletal:  Negative for back pain, neck pain and neck stiffness.  Skin:  Negative for rash.  Neurological:  Negative for light-headedness and headaches.  Psychiatric/Behavioral:  Positive for dysphoric mood, hallucinations and suicidal ideas.     Physical Exam Updated Vital Signs BP 106/71 (BP Location: Right Arm)   Pulse 84   Temp 97.8 F (36.6 C) (Oral)   Resp 17   Ht 6\' 4"  (1.93 m)   Wt 113.4 kg   SpO2 98%   BMI 30.43 kg/m  Physical Exam Vitals and nursing note reviewed.  Constitutional:      General: He is not in acute distress.    Appearance: He is well-developed.  HENT:     Head: Normocephalic and atraumatic.     Mouth/Throat:     Mouth: Mucous membranes are moist.  Eyes:     General:        Right eye: No discharge.        Left eye: No discharge.     Conjunctiva/sclera: Conjunctivae normal.  Neck:     Trachea: No tracheal deviation.  Cardiovascular:     Rate and Rhythm: Normal rate and regular rhythm.     Heart sounds: No murmur heard. Pulmonary:     Effort: Pulmonary effort is normal.     Breath sounds: Normal breath sounds.  Abdominal:     General: There is no distension.     Palpations: Abdomen is soft.     Tenderness: There is no abdominal tenderness. There is no guarding.  Musculoskeletal:      Cervical back: Normal range of motion and neck supple. No rigidity.  Skin:    General: Skin is warm.     Capillary Refill: Capillary refill takes less than 2 seconds.     Findings: No rash.  Neurological:     General: No focal deficit present.     Mental Status: He is alert.     Cranial Nerves: No cranial nerve deficit.  Psychiatric:        Mood and Affect: Mood is depressed.        Thought Content: Thought content includes homicidal and suicidal ideation. Thought content includes homicidal and suicidal plan.        Judgment: Judgment is not impulsive.     Comments: Patient more flat affect, not very talkative     ED Results / Procedures / Treatments   Labs (all labs ordered are listed, but only abnormal results are displayed) Labs Reviewed  COMPREHENSIVE METABOLIC PANEL - Abnormal; Notable for the following components:      Result Value   CO2 20 (*)    Glucose, Bld 113 (*)    Creatinine, Ser 2.09 (*)    Calcium 8.8 (*)    GFR, Estimated 34 (*)    All other components within normal limits  SALICYLATE LEVEL - Abnormal; Notable for the following components:   Salicylate Lvl <7.0 (*)    All other components within normal limits  ACETAMINOPHEN LEVEL - Abnormal; Notable for the following components:   Acetaminophen (Tylenol), Serum <10 (*)    All other components within normal limits  ETHANOL  CBC  RAPID URINE DRUG SCREEN, HOSP PERFORMED  TROPONIN I (HIGH SENSITIVITY)    EKG EKG Interpretation Date/Time:  Monday October 10 2023 09:52:06 EST Ventricular Rate:  85 PR Interval:  174 QRS Duration:  84 QT Interval:  394 QTC Calculation: 468 R Axis:   62  Text Interpretation: Sinus rhythm with Premature atrial complexes Otherwise normal ECG When compared with ECG of 15-Sep-2023 16:01, PREVIOUS ECG IS PRESENT Confirmed by Blane Ohara [phone]) on 10/10/2023 11:24:31 AM  Radiology DG Chest 2 View  Result Date: 10/10/2023 CLINICAL DATA:  Midsternal chest pain. EXAM: CHEST -  2 VIEW COMPARISON:  07/12/2023. FINDINGS: Bilateral lung fields are clear. Bilateral costophrenic angles are clear. Normal cardio-mediastinal silhouette. No acute osseous abnormalities. The soft tissues are within normal limits. IMPRESSION: No active cardiopulmonary disease. Electronically Signed   By: Jules Schick M.D.   On: 10/10/2023 11:25    Procedures Procedures  {Document cardiac monitor, telemetry assessment procedure when appropriate:1}  Medications Ordered in ED Medications  acetaminophen (TYLENOL) tablet 1,000 mg (has no administration in time range)    ED Course/ Medical Decision Making/ A&P   {   Click here for ABCD2,  HEART and other calculatorsREFRESH Note before signing :1}                              Medical Decision Making Amount and/or Complexity of Data Reviewed Labs: ordered. Radiology: ordered.   Patient presents with fairly constant chest discomfort for the past 2 days.  Patient has atypical signs and symptoms however does have risk factors and CAD in his chart.  Plan for delta troponin, Tylenol for pain.  Patient mitts to using cocaine which may be related with his history of vasospasm.  EKG reviewed no acute ischemic findings.  General blood work ordered and sent, urine drug test pending.  From behavioral health standpoint patient is actively suicidal and homicidal.  Plan for monitoring in the ER and behavior health assessment for final disposition.   Patient labs reviewed troponin negative, electrolytes overall unremarkable, kidney function worsened from just over 1-2.09 likely multifactorial.  Will provide IV fluids for dehydration/ prerenal component. With chest pain, acute renal sufficiency plan discussed with unassigned.  Behavioral health assessment pending.  {Document critical care time when appropriate:1} {Document review of labs and clinical decision tools ie heart score, Chads2Vasc2 etc:1}  {Document your independent review of radiology images, and  any outside records:1} {Document your discussion with family members, caretakers, and with consultants:1} {Document social determinants of health affecting pt's care:1} {Document your decision making why or why not admission, treatments were needed:1} Final Clinical Impression(s) / ED Diagnoses Final diagnoses:  Acute chest pain  Suicidal ideation  Acute renal insufficiency  Cocaine use    Rx / DC Orders ED Discharge Orders     None

## 2023-10-10 NOTE — ED Notes (Signed)
ED TO INPATIENT HANDOFF REPORT  ED Nurse Name and Phone #: Pennie Rushing A. Shakirra Buehler, RN  S Name/Age/Gender Robert Chan 67 y.o. male Room/Bed: H014C/H014C  Code Status   Code Status: Full Code  Home/SNF/Other Home Patient oriented to: self, place, time, and situation Is this baseline? Yes   Triage Complete: Triage complete  Chief Complaint AKI (acute kidney injury) (HCC) [N17.9]  Triage Note Pt c/o mid-sternal chest pain that started a few days ago. Pt has had shortness of breath. Denies nausea or vomiting. Pt states he is also having depression, suicidal thoughts, and homicidal thoughts. Pt denies AVH.   Allergies Allergies  Allergen Reactions   Shellfish Allergy Hives and Rash    Level of Care/Admitting Diagnosis ED Disposition     ED Disposition  Admit   Condition  --   Comment  Hospital Area: [facility] [[id number]]  Level of Care: Med-Surg [16]  May place patient in observation at [facility] or [facility] if equivalent level of care is available:: No  Covid Evaluation: Asymptomatic - no recent exposure (last 10 days) testing not required  Diagnosis: AKI (acute kidney injury) [facility]) [[id number]]  Admitting Physician: Silvio Pate  Attending Physician: Silvio Pate          B Medical/Surgery History 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   Depression    Elevated serum creatinine 08/28/2019   Homelessness 06/19/2020   Homicidal ideations 05/25/2023   Hypertension    Major depressive disorder, recurrent episode, 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/11/2022   Major depressive disorder, single episode, 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/10/2022   MDD (major depressive disorder), recurrent  severe, 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   NSTEMI (non-ST elevated myocardial infarction) (HCC)    Schizoaffective disorder (HCC) 11/08/2022   Status post incision and drainage 04/22/2021   Suicidal ideation 06/05/2020   Suicidal ideation 06/05/2020   Past Surgical History:  Procedure Laterality Date   APPENDECTOMY     EYE SURGERY     LEFT HEART CATH AND CORONARY ANGIOGRAPHY N/A 06/05/2020   Procedure: LEFT HEART CATH AND CORONARY ANGIOGRAPHY;  Surgeon: Elder Negus, MD;  Location: [facility] INVASIVE CV LAB;  Service: Cardiovascular;  Laterality: N/A;   PROSTATE SURGERY       A IV Location/Drains/Wounds Patient Lines/Drains/Airways Status     Active Line/Drains/Airways     Name Placement date Placement time Site Days   Peripheral IV 10/10/23 20 G Anterior;Left Forearm 10/10/23  1247  Forearm  less than 1            Intake/Output Last 24 hours No intake or output data in the 24 hours ending 10/10/23 1426  Labs/Imaging Results for orders placed or performed during the hospital encounter of 10/10/23 (from the past 48 hour(s))  Comprehensive metabolic panel     Status: Abnormal   Collection Time: 10/10/23 10:04 AM  Result Value Ref Range   Sodium 137 135 - 145 mmol/L   Potassium 3.6 3.5 - 5.1 mmol/L   Chloride 108 98 - 111 mmol/L   CO2 20 (L) 22 - 32 mmol/L   Glucose, Bld 113 (H) 70 - 99 mg/dL    Comment: Glucose  reference range applies only to samples taken after fasting for at least 8 hours.   BUN 13 8 - 23 mg/dL   Creatinine, Ser 4.09 (H) 0.61 - 1.24 mg/dL   Calcium 8.8 (L) 8.9 - 10.3 mg/dL   Total Protein 7.3 6.5 - 8.1 g/dL   Albumin 3.5 3.5 - 5.0 g/dL   AST 20 15 - 41 U/L   ALT 14 0 - 44 U/L   Alkaline Phosphatase 72 38 - 126 U/L   Total Bilirubin 0.8 <1.2 mg/dL   GFR, Estimated 34 (L) >60 mL/min    Comment: (NOTE) Calculated using the CKD-EPI Creatinine Equation (2021)    Anion gap 9 5 - 15    Comment: Performed at [facility], [address], [place], [place] [address]  Ethanol     Status: None   Collection Time: 10/10/23 10:04 AM  Result Value Ref Range   Alcohol, Ethyl (B) <10 <10 mg/dL    Comment: (NOTE) Lowest detectable limit for serum alcohol is 10 mg/dL.  For medical purposes only. Performed at [facility], [address], [place], [place] [address]   Salicylate level     Status: Abnormal   Collection Time: 10/10/23 10:04 AM  Result Value Ref Range   Salicylate Lvl <7.0 (L) 7.0 - 30.0 mg/dL    Comment: Performed at [facility], [address] [place], [place] [address]  Acetaminophen level     Status: Abnormal   Collection Time: 10/10/23 10:04 AM  Result Value Ref Range   Acetaminophen (Tylenol), Serum <10 (L) 10 - 30 ug/mL    Comment: (NOTE) Therapeutic concentrations vary significantly. A range of 10-30 ug/mL  may be an effective concentration for many patients. However, some  are best treated at concentrations outside of this range. Acetaminophen concentrations >150 ug/mL at 4 hours after ingestion  and >50 ug/mL at 12 hours after ingestion are often associated with  toxic reactions.  Performed at [facility], [address], [place], [place] [address]   cbc     Status: None   Collection Time: 10/10/23 10:04 AM  Result Value Ref Range   WBC 7.7 4.0 - 10.5 K/uL   RBC 4.30 4.22 - 5.81 MIL/uL   Hemoglobin 13.0 13.0 - 17.0 g/dL   HCT 84.6 96.2 - 95.2 %   MCV 94.0 80.0 - 100.0 fL   MCH 30.2 26.0 - 34.0 pg   MCHC 32.2 30.0 - 36.0 g/dL   RDW 84.1 32.4 - 40.1 %   Platelets 289 150 - 400 K/uL   nRBC 0.0 0.0 - 0.2 %    Comment: Performed at [facility], [address], [place], [place] [address]  Troponin I (High Sensitivity)     Status: None   Collection Time: 10/10/23 10:04 AM  Result Value Ref Range   Troponin I (High Sensitivity) 11 <18 ng/L    Comment: (NOTE) Elevated high sensitivity troponin I (hsTnI) values and significant  changes across serial measurements may  suggest ACS but many other  chronic and acute conditions are known to elevate hsTnI results.  Refer to the "Links" section for chest pain algorithms and additional  guidance. Performed at [facility], [address], [place], [place] [address]    DG Chest 2 View  Result Date: 10/10/2023 CLINICAL DATA:  Midsternal chest pain. EXAM: CHEST - 2 VIEW COMPARISON:  07/12/2023. FINDINGS: Bilateral lung fields are clear. Bilateral costophrenic angles  are clear. Normal cardio-mediastinal silhouette. No acute osseous abnormalities. The soft tissues are within normal limits. IMPRESSION: No active cardiopulmonary disease. Electronically Signed   By: Jules Schick M.D.   On: 10/10/2023 11:25    Pending Labs Unresulted Labs (From admission, onward)     Start     Ordered   10/11/23 0500  Basic metabolic panel  Tomorrow morning,   R        10/10/23 1415   10/11/23 0500  CBC  Tomorrow morning,   R        10/10/23 1415   10/10/23 1414  HIV Antibody (routine testing w rflx)  (HIV Antibody (Routine testing w reflex) panel)  Add-on,   AD        10/10/23 1415   10/10/23 0959  Rapid urine drug screen (hospital performed)  Once,   STAT        10/10/23 0958            Vitals/Pain Today's Vitals   10/10/23 0954 10/10/23 0956 10/10/23 1359  BP: 106/71  (!) 140/81  Pulse: 84  60  Resp: 17  16  Temp: 97.8 F (36.6 C)  98.1 F (36.7 C)  TempSrc: Oral    SpO2: 98%  99%  Weight:  113.4 kg   Height:  6\' 4"  (1.93 m)   PainSc:  6      Isolation Precautions No active isolations  Medications Medications  rivaroxaban (XARELTO) tablet 10 mg (has no administration in time range)  acetaminophen (TYLENOL) tablet 650 mg (has no administration in time range)    Or  acetaminophen (TYLENOL) suppository 650 mg (has no administration in time range)  senna-docusate (Senokot-S) tablet 1 tablet (has no administration in time range)  acetaminophen (TYLENOL) tablet 1,000 mg (1,000 mg Oral Given 10/10/23  1249)  sodium chloride 0.9 % bolus 500 mL (0 mLs Intravenous Stopped 10/10/23 1359)    Mobility walks     Focused Assessments    R Recommendations: See Admitting Provider Note  Report given to:   Additional Notes:  Call or epic message for any additional details

## 2023-10-10 NOTE — Plan of Care (Signed)

## 2023-10-10 NOTE — ED Triage Notes (Addendum)
Pt c/o mid-sternal chest pain that started a few days ago. Pt has had shortness of breath. Denies nausea or vomiting. Pt states he is also having depression, suicidal thoughts, and homicidal thoughts. Pt denies AVH.

## 2023-10-11 ENCOUNTER — Encounter: Payer: Self-pay | Admitting: Family

## 2023-10-11 ENCOUNTER — Other Ambulatory Visit: Payer: Self-pay

## 2023-10-11 ENCOUNTER — Inpatient Hospital Stay
Admission: AD | Admit: 2023-10-11 | Discharge: 2023-11-01 | DRG: 885 | Disposition: A | Discharge status: Home / Self Care | Payer: 59 | Source: Intra-hospital | Attending: Psychiatry | Admitting: Psychiatry

## 2023-10-11 DIAGNOSIS — G4700 Insomnia, unspecified: Secondary | ICD-10-CM | POA: Diagnosis present

## 2023-10-11 DIAGNOSIS — J4489 Other specified chronic obstructive pulmonary disease: Secondary | ICD-10-CM | POA: Diagnosis present

## 2023-10-11 DIAGNOSIS — Z1152 Encounter for screening for COVID-19: Secondary | ICD-10-CM | POA: Diagnosis not present

## 2023-10-11 DIAGNOSIS — I129 Hypertensive chronic kidney disease with stage 1 through stage 4 chronic kidney disease, or unspecified chronic kidney disease: Secondary | ICD-10-CM | POA: Diagnosis present

## 2023-10-11 DIAGNOSIS — F332 Major depressive disorder, recurrent severe without psychotic features: Secondary | ICD-10-CM

## 2023-10-11 DIAGNOSIS — Z7951 Long term (current) use of inhaled steroids: Secondary | ICD-10-CM

## 2023-10-11 DIAGNOSIS — R45850 Homicidal ideations: Secondary | ICD-10-CM | POA: Diagnosis present

## 2023-10-11 DIAGNOSIS — Z8249 Family history of ischemic heart disease and other diseases of the circulatory system: Secondary | ICD-10-CM

## 2023-10-11 DIAGNOSIS — F17210 Nicotine dependence, cigarettes, uncomplicated: Secondary | ICD-10-CM | POA: Diagnosis present

## 2023-10-11 DIAGNOSIS — R45851 Suicidal ideations: Secondary | ICD-10-CM | POA: Diagnosis present

## 2023-10-11 DIAGNOSIS — F1910 Other psychoactive substance abuse, uncomplicated: Secondary | ICD-10-CM | POA: Diagnosis present

## 2023-10-11 DIAGNOSIS — Z91013 Allergy to seafood: Secondary | ICD-10-CM | POA: Diagnosis not present

## 2023-10-11 DIAGNOSIS — F1490 Cocaine use, unspecified, uncomplicated: Secondary | ICD-10-CM

## 2023-10-11 DIAGNOSIS — F1410 Cocaine abuse, uncomplicated: Secondary | ICD-10-CM | POA: Diagnosis present

## 2023-10-11 DIAGNOSIS — N189 Chronic kidney disease, unspecified: Secondary | ICD-10-CM | POA: Diagnosis present

## 2023-10-11 DIAGNOSIS — Z79899 Other long term (current) drug therapy: Secondary | ICD-10-CM | POA: Diagnosis not present

## 2023-10-11 DIAGNOSIS — Z5941 Food insecurity: Secondary | ICD-10-CM

## 2023-10-11 DIAGNOSIS — Z833 Family history of diabetes mellitus: Secondary | ICD-10-CM

## 2023-10-11 DIAGNOSIS — I2510 Atherosclerotic heart disease of native coronary artery without angina pectoris: Secondary | ICD-10-CM | POA: Diagnosis present

## 2023-10-11 DIAGNOSIS — Z5982 Transportation insecurity: Secondary | ICD-10-CM

## 2023-10-11 DIAGNOSIS — F259 Schizoaffective disorder, unspecified: Secondary | ICD-10-CM | POA: Diagnosis present

## 2023-10-11 DIAGNOSIS — Z91199 Patient's noncompliance with other medical treatment and regimen due to unspecified reason: Secondary | ICD-10-CM | POA: Diagnosis not present

## 2023-10-11 DIAGNOSIS — N179 Acute kidney failure, unspecified: Principal | ICD-10-CM

## 2023-10-11 DIAGNOSIS — I252 Old myocardial infarction: Secondary | ICD-10-CM | POA: Diagnosis not present

## 2023-10-11 LAB — CBC
HCT: 36.2 % — ABNORMAL LOW (ref 39.0–52.0)
Hemoglobin: 11.6 g/dL — ABNORMAL LOW (ref 13.0–17.0)
MCH: 29.9 pg (ref 26.0–34.0)
MCHC: 32 g/dL (ref 30.0–36.0)
MCV: 93.3 fL (ref 80.0–100.0)
Platelets: 226 10*3/uL (ref 150–400)
RBC: 3.88 MIL/uL — ABNORMAL LOW (ref 4.22–5.81)
RDW: 13.2 % (ref 11.5–15.5)
WBC: 8.1 10*3/uL (ref 4.0–10.5)
nRBC: 0 % (ref 0.0–0.2)

## 2023-10-11 LAB — BASIC METABOLIC PANEL
Anion gap: 6 (ref 5–15)
BUN: 12 mg/dL (ref 8–23)
CO2: 23 mmol/L (ref 22–32)
Calcium: 8.3 mg/dL — ABNORMAL LOW (ref 8.9–10.3)
Chloride: 108 mmol/L (ref 98–111)
Creatinine, Ser: 1.17 mg/dL (ref 0.61–1.24)
GFR, Estimated: >60 mL/min (ref >60)
Glucose, Bld: 116 mg/dL — ABNORMAL HIGH (ref 70–99)
Potassium: 3.7 mmol/L (ref 3.5–5.1)
Sodium: 137 mmol/L (ref 135–145)

## 2023-10-11 LAB — HIV ANTIBODY (ROUTINE TESTING W REFLEX): HIV Screen 4th Generation wRfx: NONREACTIVE

## 2023-10-11 LAB — RESP PANEL BY RT-PCR (RSV, FLU A&B, COVID)  RVPGX2
Influenza A by PCR: NEGATIVE
Influenza B by PCR: NEGATIVE
Resp Syncytial Virus by PCR: NEGATIVE
SARS Coronavirus 2 by RT PCR: NEGATIVE

## 2023-10-11 MED ORDER — LIDOCAINE 5 % EX PTCH: 1.0000 | MEDICATED_PATCH | CUTANEOUS | Status: DC

## 2023-10-11 MED ORDER — RIVAROXABAN 10 MG PO TABS
10.0000 mg | ORAL_TABLET | Freq: Every day | ORAL | Status: DC
  Administered 2023-10-12 – 2023-10-27 (×16): 10 mg via ORAL
  Filled 2023-10-11 (×16): qty 1

## 2023-10-11 MED ORDER — PANTOPRAZOLE SODIUM 20 MG PO TBEC
20.0000 mg | DELAYED_RELEASE_TABLET | Freq: Every day | ORAL | Status: DC
  Administered 2023-10-12 – 2023-11-01 (×21): 20 mg via ORAL
  Filled 2023-10-11 (×22): qty 1

## 2023-10-11 MED ORDER — LATANOPROST 0.005 % OP SOLN
1.0000 [drp] | Freq: Every day | OPHTHALMIC | Status: DC
Start: 2023-10-11 — End: 2023-11-01
  Administered 2023-10-11 – 2023-10-31 (×21): 1 [drp] via OPHTHALMIC
  Filled 2023-10-11: qty 2.5

## 2023-10-11 MED ORDER — OLANZAPINE 5 MG PO TBDP: 5.0000 mg | ORAL_TABLET | Freq: Three times a day (TID) | ORAL | Status: DC | PRN

## 2023-10-11 MED ORDER — MOMETASONE FURO-FORMOTEROL FUM 200-5 MCG/ACT IN AERO
2.0000 | INHALATION_SPRAY | Freq: Two times a day (BID) | RESPIRATORY_TRACT | Status: DC
  Administered 2023-10-11 – 2023-11-01 (×42): 2 via RESPIRATORY_TRACT
  Filled 2023-10-11: qty 8.8

## 2023-10-11 MED ORDER — ACETAMINOPHEN 325 MG PO TABS
650.0000 mg | ORAL_TABLET | Freq: Four times a day (QID) | ORAL | Status: DC | PRN
  Administered 2023-10-19: 650 mg via ORAL
  Filled 2023-10-11: qty 2

## 2023-10-11 MED ORDER — MAGNESIUM HYDROXIDE 400 MG/5ML PO SUSP
30.0000 mL | Freq: Every day | ORAL | Status: DC | PRN
  Administered 2023-10-12: 30 mL via ORAL
  Filled 2023-10-11: qty 30

## 2023-10-11 MED ORDER — SENNOSIDES-DOCUSATE SODIUM 8.6-50 MG PO TABS
1.0000 | ORAL_TABLET | Freq: Every evening | ORAL | Status: DC | PRN
  Administered 2023-10-12 – 2023-10-13 (×2): 1 via ORAL
  Filled 2023-10-11 (×2): qty 1

## 2023-10-11 MED ORDER — ALUM & MAG HYDROXIDE-SIMETH 200-200-20 MG/5ML PO SUSP: 30.0000 mL | ORAL | Status: DC | PRN

## 2023-10-11 MED ORDER — ATORVASTATIN CALCIUM 80 MG PO TABS
80.0000 mg | ORAL_TABLET | Freq: Every day | ORAL | Status: DC
  Administered 2023-10-12 – 2023-11-01 (×21): 80 mg via ORAL
  Filled 2023-10-11 (×21): qty 1

## 2023-10-11 MED ORDER — LIDOCAINE 5 % EX PTCH
1.0000 | MEDICATED_PATCH | CUTANEOUS | Status: DC
  Filled 2023-10-11: qty 1

## 2023-10-11 MED ORDER — MOMETASONE FURO-FORMOTEROL FUM 200-5 MCG/ACT IN AERO
2.0000 | INHALATION_SPRAY | Freq: Two times a day (BID) | RESPIRATORY_TRACT | Status: DC
  Administered 2023-10-11: 2 via RESPIRATORY_TRACT

## 2023-10-11 NOTE — Group Note (Unsigned)
Date:  10/11/2023 Time:  2:53 PM  Group Topic/Focus:  Movement Therapy     Participation Level:  {BHH PARTICIPATION GEXBM:84132}  Participation Quality:  {BHH PARTICIPATION QUALITY:22265}  Affect:  {BHH AFFECT:22266}  Cognitive:  {BHH COGNITIVE:22267}  Insight: {BHH Insight2:20797}  Engagement in Group:  {BHH ENGAGEMENT IN GMWNU:27253}  Modes of Intervention:  {BHH MODES OF INTERVENTION:22269}  Additional Comments:  ***  [name] 10/11/2023, 2:53 PM

## 2023-10-11 NOTE — Group Note (Signed)
Date:  10/11/2023 Time:  9:20 PM  Group Topic/Focus:  Making Healthy Choices:   The focus of this group is to help patients identify negative/unhealthy choices they were using prior to admission and identify positive/healthier coping strategies to replace them upon discharge.    Participation Level:  Active  Participation Quality:  Appropriate  Affect:  Appropriate  Cognitive:  Appropriate  Insight: Appropriate  Engagement in Group:  Engaged  Modes of Intervention:  Education  Additional Comments:    Garry Heater 10/11/2023, 9:20 PM
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
## 2023-10-11 NOTE — Progress Notes (Signed)
Mobility Specialist Progress Note:   10/11/23 1105  Mobility  Activity Ambulated independently in hallway  Level of Assistance Independent  Assistive Device None  Distance Ambulated (ft) 300 ft  Activity Response Tolerated well  Mobility Referral Yes  Mobility visit 1 Mobility  Mobility Specialist Start Time (ACUTE ONLY) 1105  Mobility Specialist Stop Time (ACUTE ONLY) 1115  Mobility Specialist Time Calculation (min) (ACUTE ONLY) 10 min   Pt agreeable to mobility session. Required no physical assistance throughout. States breathing is back to normal. No SOB noted. Back in bed with all needs met, sitter present.   Addison Lank Mobility Specialist Please contact via SecureChat or  Rehab office at [phone]

## 2023-10-11 NOTE — Plan of Care (Signed)
  Problem: Pain Management: Goal: General experience of comfort will improve Outcome: Progressing   Problem: Safety: Goal: Ability to remain free from injury will improve Outcome: Progressing   Problem: Skin Integrity: Goal: Risk for impaired skin integrity will decrease Outcome: Not Progressing   Problem: Education: Goal: Utilization of techniques to improve thought processes will improve Outcome: Not Progressing

## 2023-10-11 NOTE — Group Note (Signed)
Recreation Therapy Group Note   Group Topic:Healthy Support Systems  Group Date: 10/11/2023 Start Time: 11:00 AM End Time: 11:50 AM Facilitators: Rosina Lowenstein, LRT, CTRS Location:  Dayroom  Group Description: Straw Bridge. In groups or individually, patients were given 10 plastic drinking straws and an equal length of masking tape. Using the materials provided, patients were instructed to build a free-standing bridge-like structure to suspend an everyday item (ex: deck of cards) off the floor or table surface. All materials were required to be used in Secondary school teacher. LRT facilitated post-activity discussion reviewing the importance of having strong and healthy support systems in our lives. LRT discussed how the people in our lives serve as the tape and the deck of cards we placed on top of our straw structure are the stressors we face in daily life. LRT and pts discussed what happens in our life when things get too heavy for [place], and we don't have strong supports outside of the hospital. Pt shared 2 of their healthy supports in their life aloud in the group.   Goal Area(s) Addressed:  Patient will identify 2 healthy supports in their life. Patient will identify skills to successfully complete activity. Patient will identify correlation of this activity to life post-discharge.  Patient will build on frustration tolerance skills. Patient will increase team building and communication skills.    Affect/Mood: N/A   Participation Level: Did not attend    Clinical Observations/Individualized Feedback: Robert Chan did not attend group due to not being on the unit yet.   Plan: Continue to engage patient in RT group sessions 2-3x/week.   Rosina Lowenstein, LRT, CTRS 10/11/2023 1:37 PM

## 2023-10-11 NOTE — Care Management Obs Status (Cosign Needed)
MEDICARE OBSERVATION STATUS NOTIFICATION   Patient Details  Name: Robert Chan MRN: [id number] Date of Birth: 11/09/1955   Medicare Observation Status Notification Given:  Yes    Janae Bridgeman, RN 10/11/2023, 2:54 PM
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
## 2023-10-11 NOTE — Plan of Care (Signed)
  Problem: Education: Goal: Knowledge of General Education information will improve Description: Including pain rating scale, medication(s)/side effects and non-pharmacologic comfort measures Outcome: Progressing   Problem: Health Behavior/Discharge Planning: Goal: Ability to manage health-related needs will improve Outcome: Progressing   Problem: Clinical Measurements: Goal: Ability to maintain clinical measurements within normal limits will improve Outcome: Progressing Goal: Will remain free from infection Outcome: Progressing Goal: Diagnostic test results will improve Outcome: Progressing Goal: Respiratory complications will improve Outcome: Progressing Goal: Cardiovascular complication will be avoided Outcome: Progressing   Problem: Activity: Goal: Risk for activity intolerance will decrease Outcome: Progressing   Problem: Nutrition: Goal: Adequate nutrition will be maintained Outcome: Progressing   Problem: Coping: Goal: Level of anxiety will decrease Outcome: Progressing   Problem: Elimination: Goal: Will not experience complications related to bowel motility Outcome: Progressing Goal: Will not experience complications related to urinary retention Outcome: Progressing   Problem: Pain Management: Goal: General experience of comfort will improve Outcome: Progressing   Problem: Skin Integrity: Goal: Risk for impaired skin integrity will decrease Outcome: Progressing
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
## 2023-10-11 NOTE — Group Note (Signed)
Recreation Therapy Group Note   Group Topic:Other  Group Date: 10/11/2023 Start Time: 1500 End Time: 1540 Facilitators: Rosina Lowenstein, LRT, CTRS Location: Courtyard  Group Description: Leisure. Patients were given the opportunity to play ring toss, play corn hole, or listen to music while sitting in the courtyard getting fresh air and sunlight. Pt identified and conversated about things they enjoy doing in their free time and how they can continue to do that outside of the hospital.  Goal Area(s) Addressed: Patient will learn the definition of "leisure". Patient will practice making a positive decision. Patient will have the opportunity to try a new leisure activity. Patient will communicate with peers and LRT.   Affect/Mood: N/A   Participation Level: Did not attend    Clinical Observations/Individualized Feedback: Damen did not attend due to not being on the unit yet.   Plan: Continue to engage patient in RT group sessions 2-3x/week.   Rosina Lowenstein, LRT, CTRS 10/11/2023 4:57 PM

## 2023-10-11 NOTE — TOC Transition Note (Signed)
Transition of Care [facility]) - CM/SW Discharge Note   Patient Details  Name: Robert Chan MRN: [id number] Date of Birth: February 16, 1956  Transition of Care [facility] CM/SW Contact:  Neeya Prigmore A [place], Theresia Majors Phone Number: 10/11/2023, 12:23 PM   Clinical Narrative:     Pt from home. Recommended for inpatient psych. Pt is medically stable for discharge. Going to [facility] [place] unit. Voluntary admission. Transportation assisted through [employer]. Pt declined collateral contact.   No other needs identified at this time. TOC will sign off, please consult again if TOC needs arise.    Final next level of care: Psych[facility] Hospital Barriers to Discharge: Barriers Resolved   Patient Goals and CMS Choice      Discharge Placement                Patient chooses bed at:  [facility]) Patient to be transferred to facility by: Safe transportation Name of family member notified: Pt declined Patient and family notified of of transfer: 10/11/23  Discharge Plan and Services Additional resources added to the After Visit Summary for                                       Social Determinants of Health (SDOH) Interventions SDOH Screenings   Food Insecurity: No Food Insecurity (10/10/2023)  Recent Concern: Food Insecurity - Food Insecurity Present (09/16/2023)  Housing: Patient Declined (10/10/2023)  Transportation Needs: Unmet Transportation Needs (10/10/2023)  Utilities: Not At Risk (10/10/2023)  Alcohol Screen: Low Risk  (09/16/2023)  Recent Concern: Alcohol Screen - High Risk (08/08/2023)  Depression (PHQ2-9): Low Risk  (09/21/2023)  Financial Resource Strain: Low Risk  (10/19/2022)  Physical Activity: Inactive (10/19/2022)  Social Connections: Moderately Integrated (03/31/2022)  Stress: No Stress Concern Present (10/19/2022)  Tobacco Use: High Risk (10/10/2023)     Readmission Risk Interventions     No data  to display

## 2023-10-12 DIAGNOSIS — F332 Major depressive disorder, recurrent severe without psychotic features: Secondary | ICD-10-CM | POA: Diagnosis not present

## 2023-10-12 DIAGNOSIS — F1410 Cocaine abuse, uncomplicated: Secondary | ICD-10-CM | POA: Diagnosis not present

## 2023-10-12 MED ORDER — MENTHOL 3 MG MT LOZG
1.0000 | LOZENGE | OROMUCOSAL | Status: DC | PRN
  Administered 2023-10-12 – 2023-10-31 (×15): 3 mg via ORAL
  Filled 2023-10-12 (×3): qty 9

## 2023-10-12 NOTE — Group Note (Signed)
Recreation Therapy Group Note   Group Topic:Coping Skills  Group Date: 10/12/2023 Start Time: 1100 End Time: 1145 Facilitators: Rosina Lowenstein, LRT, CTRS Location:  Day Room  Group Description: Mind Map.  Patient was provided a blank template of a diagram with 32 blank boxes in a tiered system, branching from the center (similar to a bubble chart). LRT directed patients to label the middle of the diagram "Coping Skills". LRT and patients then came up with 8 different coping skills as examples. Pt were directed to record their coping skills in the 2nd tier boxes closest to the center.  Patients would then share their coping skills with the group as LRT wrote them out. LRT gave a handout of 99 different coping skills at the end of group.   Goal Area(s) Addressed: Patients will be able to define "coping skills". Patient will identify new coping skills.  Patient will increase communication.   Affect/Mood: Appropriate, Blunted, and Flat   Participation Level: Active   Participation Quality: Independent   Behavior: Appropriate, Calm, and Cooperative   Speech/Thought Process: Coherent   Insight: Limited   Judgement: Fair    Modes of Intervention: Education, Guided Discussion, and Worksheet   Patient Response to Interventions:  Attentive and Receptive   Education Outcome:  Acknowledges education   Clinical Observations/Individualized Feedback: Robert Chan was active in their participation of session activities and group discussion. Pt identified "music, go outside, and walk" as coping skills. Pt minimally interacted with LRT or peers while in group.    Plan: Continue to engage patient in RT group sessions 2-3x/week.   Rosina Lowenstein, LRT, CTRS 10/12/2023 1:26 PM

## 2023-10-12 NOTE — H&P (Signed)
` Psychiatric Admission Assessment Adult  Patient Identification: Robert Chan MRN:  [id number] Date of Evaluation:  10/12/2023 Chief Complaint:  MDD (major depressive disorder), recurrent episode, severe (HCC) [F33.2] Principal Diagnosis: MDD (major depressive disorder), recurrent episode, severe (HCC) Diagnosis:  Principal Problem:   MDD (major depressive disorder), recurrent episode, severe (HCC)  History of Present Illness: Robert Chan is a 67 y.o. male who was initially admitted medically for 10/10/2023  for 2 days of chest pain and week of SI/HI. He carries the psychiatric diagnoses of MDD, severe, recurrent, without psychosis and has a past medical history of  atherosclerotic CAD, COPD, MDD without psychosis, hypertension, hx of cocaine use, history of NSTEMI 06/2020, underwent Left cardiac cath showed 30% prox LAD stenosis and 20% mid RCA stenosis .  Chart reviewed, patient case discussed in multidisciplinary meeting, patient seen during rounds and in treatment team meeting with RN and Child psychotherapist.  Patient endorsed depressed mood, anhedonia, poor sleep, he feels helpless and hopeless.  Patient said that after his discharge from this facility he did not fill his prescriptions.  He relapsed on cocaine.  Patient reports that he was having thoughts of suicide and had plan to shoot himself.  Per patient's report he does not own a gun but he borrowed a gun from his friend and and tried to shoot himself but the gun did not fire.  Patient was provided with support and reassurance.  He was encouraged to work on coping strategies.  Patient was also encouraged to abstain from cocaine use. Patient denies any intention to harm himself on the unit.  He denies psychotic or manic symptoms.  Past Psychiatric History: Patient has a long history of depression and cocaine use.  Patient was admitted to this facility in October.  He has been noncompliant with treatment  Is the patient at risk to self? Yes.     Has the patient been a risk to self in the past 6 months? Yes.    Has the patient been a risk to self within the distant past? Yes.    Is the patient a risk to others? No.  Has the patient been a risk to others in the past 6 months? No.  Has the patient been a risk to others within the distant past? No.   [place] Scale:  Flowsheet Row Admission (Current) from 10/11/2023 in [facility] ED to Hosp-Admission (Discharged) from 10/10/2023 in [place] [facility] ED from 09/16/2023 in [facility]  C-SSRS RISK CATEGORY No Risk High Risk No Risk        Prior Inpatient Therapy: Yes.     Prior Outpatient Therapy: Yes.     Alcohol Screening: 1. How often do you have a drink containing alcohol?: Monthly or less 2. How many drinks containing alcohol do you have on a typical day when you are drinking?: 1 or 2 3. How often do you have six or more drinks on one occasion?: Never AUDIT-C Score: 1 4. How often during the last year have you found that you were not able to stop drinking once you had started?: Never 5. How often during the last year have you failed to do what was normally expected from you because of drinking?: Never 6. How often during the last year have you needed a first drink in the morning to get yourself going after a heavy drinking session?: Never 7. How often during the last year have you had a feeling of  guilt of remorse after drinking?: Never 8. How often during the last year have you been unable to remember what happened the night before because you had been drinking?: Never 9. Have you or someone else been injured as a result of your drinking?: No 10. Has a relative or friend or a doctor or another health worker been concerned about your drinking or suggested you cut down?: No Alcohol Use Disorder Identification Test Final Score (AUDIT): 1 Alcohol Brief Interventions/Follow-up: Alcohol education/Brief  advice Substance Abuse History in the last 12 months:  Yes.    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   Depression    Elevated serum creatinine 08/28/2019   Homelessness 06/19/2020   Homicidal ideations 05/25/2023   Hypertension    Major depressive disorder, recurrent episode, 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/11/2022   Major depressive disorder, single episode, 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/10/2022   MDD (major depressive disorder), recurrent severe, 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   NSTEMI (non-ST elevated myocardial infarction) (HCC)    Schizoaffective disorder (HCC) 11/08/2022   Status post incision and drainage 04/22/2021   Suicidal ideation 06/05/2020   Suicidal ideation 06/05/2020    Past Surgical History:  Procedure Laterality Date   APPENDECTOMY     EYE SURGERY     LEFT HEART CATH AND CORONARY ANGIOGRAPHY N/A 06/05/2020   Procedure: LEFT HEART CATH AND CORONARY ANGIOGRAPHY;  Surgeon: Elder Negus, MD;  Location: [facility] INVASIVE CV LAB;  Service: Cardiovascular;  Laterality: N/A;   PROSTATE SURGERY     Family History:  Family History  Problem Relation Age of Onset   Hypertension Mother    Diabetes Mother    Hypertension Father    Hypertension Sister    Family Psychiatric  History: Patient reports history brothers committed suicide by overdose on drugs Tobacco Screening:  Social History   Tobacco Use  Smoking Status Some Days   Current packs/day: 0.00   Types: Cigarettes   Last attempt to quit: 12/25/2020   Years since quitting: 2.7  Smokeless Tobacco Never  Tobacco Comments   Smokes a couple cigarettes every  now and then    [facility] Tobacco Counseling     Are you interested in Tobacco Cessation Medications?  No, patient refused Counseled patient on smoking cessation:  Yes Reason Tobacco Screening Not Completed: No value filed.       Social History:  Social History   Substance and Sexual Activity  Alcohol Use Yes   Comment: 4 40oz beer a week     Social History   Substance and Sexual Activity  Drug Use Yes   Types: Cocaine   Comment: cocaine use once a month, last use 6 mos ago    Additional Social History:  Patient reports he is on Social Security disability income.  He reports he gets around $1000/month.  He lives alone in an apartment                        Allergies:   Allergies  Allergen Reactions   Shellfish Allergy Hives and Rash   Lab Results:  Results for orders placed or performed during the hospital encounter of 10/10/23 (from the past 48 hour(s))  Rapid urine drug screen (hospital performed)     Status: Abnormal   Collection Time: 10/10/23  4:12 PM  Result Value Ref Range   Opiates NONE DETECTED NONE DETECTED   Cocaine POSITIVE (A) NONE DETECTED   Benzodiazepines NONE DETECTED NONE DETECTED   Amphetamines NONE DETECTED NONE DETECTED   Tetrahydrocannabinol NONE DETECTED NONE DETECTED   Barbiturates NONE DETECTED NONE DETECTED    Comment: (NOTE) DRUG SCREEN FOR MEDICAL PURPOSES ONLY.  IF CONFIRMATION IS NEEDED FOR ANY PURPOSE, NOTIFY LAB WITHIN 5 DAYS.  LOWEST DETECTABLE LIMITS FOR URINE DRUG SCREEN Drug Class                     Cutoff (ng/mL) Amphetamine and metabolites    1000 Barbiturate and metabolites    200 Benzodiazepine                 200 Opiates and metabolites        300 Cocaine and metabolites        300 THC                            50 Performed at [facility], [address], [place], [place] [address]   HIV Antibody (routine testing w rflx)     Status: None   Collection Time: 10/11/23  5:40 AM  Result Value Ref Range    HIV Screen 4th Generation wRfx Non Reactive Non Reactive    Comment: Performed at [facility], [address], [place], [place] [address]  Basic metabolic panel     Status: Abnormal   Collection Time: 10/11/23  5:40 AM  Result Value Ref Range   Sodium 137 135 - 145 mmol/L   Potassium 3.7 3.5 - 5.1 mmol/L   Chloride 108 98 - 111 mmol/L   CO2 23 22 - 32 mmol/L   Glucose, Bld 116 (H) 70 - 99 mg/dL    Comment: Glucose reference range applies only to samples taken after fasting for at least 8 hours.   BUN 12 8 - 23 mg/dL   Creatinine, Ser 9.62 0.61 - 1.24 mg/dL   Calcium 8.3 (L) 8.9 - 10.3 mg/dL   GFR, Estimated >95 >28 mL/min    Comment: (NOTE) Calculated using the CKD-EPI Creatinine Equation (2021)    Anion gap 6 5 - 15    Comment: Performed at [facility], [address] [place], [place] [address]  CBC     Status: Abnormal   Collection Time: 10/11/23  5:40 AM  Result Value Ref Range   WBC 8.1 4.0 - 10.5 K/uL   RBC 3.88 (L) 4.22 - 5.81 MIL/uL   Hemoglobin 11.6 (L) 13.0 - 17.0 g/dL   HCT 40.1 (L) 02.7 - 25.3 %   MCV 93.3 80.0 - 100.0 fL   MCH 29.9 26.0 - 34.0 pg   MCHC 32.0 30.0 - 36.0 g/dL   RDW 66.4 40.3 - 47.4 %   Platelets 226 150 - 400 K/uL   nRBC 0.0 0.0 - 0.2 %    Comment: Performed at [facility], [address] [place], [place] [address]  Resp panel by RT-PCR (RSV, Flu A&B, Covid) Anterior Nasal Swab     Status: None   Collection Time: 10/11/23 11:34 AM   Specimen: Anterior Nasal Swab  Result Value Ref Range   SARS Coronavirus 2 by RT PCR NEGATIVE NEGATIVE   Influenza A by PCR NEGATIVE NEGATIVE   Influenza B by PCR NEGATIVE NEGATIVE    Comment: (NOTE) The Xpert Xpress SARS-CoV-2/FLU/RSV plus assay is intended as an aid in the diagnosis of influenza from Nasopharyngeal swab specimens and should not be used as a sole basis for treatment. Nasal washings and aspirates are unacceptable for Xpert Xpress SARS-CoV-2/FLU/RSV testing.  Fact Sheet for  Patients: BloggerCourse.com  Fact Sheet for Healthcare Providers: SeriousBroker.it  This test is not yet approved or cleared by the [place] FDA and has been authorized for detection and/or diagnosis of SARS-CoV-2 by FDA under an Emergency Use Authorization (EUA). This EUA will remain in effect (meaning this test can be used) for the duration of the COVID-19 declaration under Section 564(b)(1) of the Act, 21 U.S.C. section 360bbb-3(b)(1), unless the authorization is terminated or revoked.     Resp Syncytial Virus by PCR NEGATIVE NEGATIVE    Comment: (NOTE) Fact Sheet for Patients: BloggerCourse.com  Fact Sheet for Healthcare Providers: SeriousBroker.it  This test is not yet approved or cleared by the [place] FDA and has been authorized for detection and/or diagnosis of SARS-CoV-2 by FDA under an Emergency Use Authorization (EUA). This EUA will remain in effect (meaning this test can be used) for the duration of the COVID-19 declaration under Section 564(b)(1) of the Act, 21 U.S.C. section 360bbb-3(b)(1), unless the authorization is terminated or revoked.  Performed at [facility], [address] [place], [place] [address]     Blood Alcohol level:  Lab Results  Component Value Date   ETH <10 10/10/2023   ETH <10 09/15/2023    Metabolic Disorder Labs:  Lab Results  Component Value Date   HGBA1C 5.1 09/15/2023   MPG 99.67 09/15/2023   MPG 103 01/13/2023   No results found for: "PROLACTIN" Lab Results  Component Value Date   CHOL 159 09/15/2023   TRIG 53 09/15/2023   HDL 61 09/15/2023   CHOLHDL 2.6 09/15/2023   VLDL 11 09/15/2023   LDLCALC 87 09/15/2023   LDLCALC 99 01/13/2023    Current Medications: Current Facility-Administered Medications  Medication Dose Route Frequency Provider Last Rate Last Admin   acetaminophen (TYLENOL) tablet 650  mg  650 mg Oral Q6H PRN Maryagnes Amos, FNP       alum & mag hydroxide-simeth (MAALOX/MYLANTA) 200-200-20 MG/5ML suspension 30 mL  30 mL Oral Q4H PRN Starkes-Perry, Juel Burrow, FNP       atorvastatin (LIPITOR) tablet 80 mg  80 mg Oral Daily Maryagnes Amos, FNP   80 mg at 10/12/23 1017   latanoprost (XALATAN) 0.005 % ophthalmic solution 1 drop  1 drop Both Eyes QHS Maryagnes Amos, FNP   1 drop at 10/11/23 2114   lidocaine (LIDODERM) 5 % 1 patch  1 patch Transdermal Q24H Starkes-Perry, Juel Burrow, FNP       magnesium hydroxide (MILK OF MAGNESIA) suspension 30 mL  30 mL Oral Daily PRN Starkes-Perry, Juel Burrow, FNP       menthol-cetylpyridinium (CEPACOL) lozenge 3 mg  1 lozenge Oral PRN Lewanda Rife, MD       mometasone-formoterol (DULERA) 200-5 MCG/ACT inhaler 2 puff  2 puff Inhalation BID Maryagnes Amos, FNP   2 puff at 10/12/23 0805   OLANZapine zydis (ZYPREXA) disintegrating tablet 5 mg  5 mg Oral TID PRN Maryagnes Amos, FNP       pantoprazole (  PROTONIX) EC tablet 20 mg  20 mg Oral Daily Maryagnes Amos, FNP   20 mg at 10/12/23 1017   rivaroxaban (XARELTO) tablet 10 mg  10 mg Oral Daily Maryagnes Amos, FNP   10 mg at 10/12/23 1017   senna-docusate (Senokot-S) tablet 1 tablet  1 tablet Oral QHS PRN Maryagnes Amos, FNP       PTA Medications: Medications Prior to Admission  Medication Sig Dispense Refill Last Dose   albuterol (VENTOLIN HFA) 108 (90 Base) MCG/ACT inhaler Inhale 2 puffs into the lungs every 6 (six) hours as needed for shortness of breath. 18 g 0    ARIPiprazole (ABILIFY) 5 MG tablet Take 1 tablet (5 mg total) by mouth daily. (Patient not taking: Reported on 10/10/2023) 30 tablet 0    atorvastatin (LIPITOR) 80 MG tablet Take 1 tablet (80 mg total) by mouth daily. (Patient not taking: Reported on 10/10/2023) 30 tablet 0    diltiazem (CARDIZEM CD) 180 MG 24 hr capsule Take 1 capsule (180 mg total) by mouth daily. (Patient not  taking: Reported on 10/10/2023) 30 capsule 0    latanoprost (XALATAN) 0.005 % ophthalmic solution Place 1 drop into both eyes at bedtime. (Patient not taking: Reported on 10/10/2023) 2.5 mL 12    lidocaine (LIDODERM) 5 % Place 1 patch onto the skin daily. Remove & Discard patch within 12 hours or as directed by MD      mometasone-formoterol (DULERA) 200-5 MCG/ACT AERO Inhale 2 puffs into the lungs 2 (two) times daily. (Patient not taking: Reported on 10/10/2023) 1 each 1    traZODone (DESYREL) 50 MG tablet Take 1 tablet (50 mg total) by mouth at bedtime as needed for sleep. (Patient not taking: Reported on 10/10/2023) 30 tablet 0     Musculoskeletal: Strength & Muscle Tone: within normal limits Gait & Station: normal Patient leans: N/A   Psychiatric Specialty Exam:   Presentation  General Appearance:  Appropriate for Environment   Eye Contact: Fair   Speech: Normal Rate   Speech Volume: Normal   Handedness: Right     Mood and Affect  Mood: Depressed   Affect: Constricted     Thought Process  Thought Processes: Coherent   Descriptions of Associations:Intact   Orientation:Full (Time, Place and Person)   Thought Content: Positive for suicide thoughts   History of Schizophrenia/Schizoaffective disorder:No   Duration of Psychotic Symptoms:N/A   Hallucinations:Hallucinations: None   Ideas of Reference:None   Suicidal Thoughts: Yes   Homicidal Thoughts: Denies     Sensorium  Memory: Immediate Good; Recent Good; Remote Good   Judgment: Poor   Insight: Fair     Chartered certified accountant: Fair   Attention Span: Fair   Recall: [employer] of Knowledge: Fair   Language: Fair     Psychomotor Activity  Psychomotor Activity: Psychomotor Activity: Normal     Assets  Assets: Social Support; Desire for Improvement     Sleep  Sleep: Poor       Physical Exam: Physical Exam Constitutional:      Appearance: Normal appearance.   HENT:     Head: Normocephalic and atraumatic.     Nose: Nose normal.  Eyes:     Pupils: Pupils are equal, round, and reactive to light.  Cardiovascular:     Rate and Rhythm: Regular rhythm.  Pulmonary:     Effort: Pulmonary effort is normal.  Skin:    General: Skin is warm.  Neurological:  General: No focal deficit present.     Mental Status: He is alert and oriented to person, place, and time.      Review of Systems  Constitutional:  Negative for chills and fever.  HENT:  Negative for hearing loss and sore throat.   Eyes:  Negative for blurred vision and double vision.  Respiratory:  Negative for cough and shortness of breath.   Cardiovascular:  Negative for chest pain.  Gastrointestinal:  Negative for nausea and vomiting.  Neurological:  Negative for dizziness and speech change.  Psychiatric/Behavioral:  Positive for depression, substance abuse and suicidal ideas. The patient is nervous/anxious and has insomnia.    Blood pressure (!) 137/96, pulse 67, temperature 98.1 F (36.7 C), resp. rate 18, height 6\' 4"  (1.93 m), weight 112.5 kg, SpO2 98%. Body mass index is 30.19 kg/m.  Treatment Plan Summary: Daily contact with patient to assess and evaluate symptoms and progress in treatment and Medication management  Observation Level/Precautions:  15 minute checks  Laboratory:  CBC Chemistry Profile UDS  Psychotherapy:    Medications:    Consultations:    Discharge Concerns:    Estimated LOS: 5-7 days  Other:     Physician Treatment Plan for Primary Diagnosis: MDD (major depressive disorder), recurrent episode, severe (HCC) Long Term Goal(s): Improvement in symptoms so as ready for discharge  Short Term Goals: Ability to identify changes in lifestyle to reduce recurrence of condition will improve, Ability to verbalize feelings will improve, Ability to disclose and discuss suicidal ideas, Ability to demonstrate self-control will improve, Ability to identify and develop  effective coping behaviors will improve, Ability to maintain clinical measurements within normal limits will improve, Compliance with prescribed medications will improve, and Ability to identify triggers associated with substance abuse/mental health issues will improve  Patient is admitted to locked unit under safety precautions Patient has been on Abilify in the past, at this time patient is denying any psychotic symptoms will defer use of Abilify for now We will start patient on Remeron 7.5 mg at bedtime for depression and insomnia Patient was encouraged to abstain from alcohol and drug use Patient was encouraged to attend group and work on coping strategies Social worker consulted to get collateral and help with a safe discharge plan.   I certify that inpatient services furnished can reasonably be expected to improve the patient's condition.    Lewanda Rife, MD

## 2023-10-12 NOTE — Group Note (Signed)
[facility] LCSW Group Therapy Note   Group Date: 10/12/2023 Start Time: 1415 End Time: 1500   Type of Therapy/Topic:  Group Therapy:  Emotion Regulation  Participation Level:  None   Mood:  Description of Group:    The purpose of this group is to assist patients in learning to regulate negative emotions and experience positive emotions. Patients will be guided to discuss ways in which they have been vulnerable to their negative emotions. These vulnerabilities will be juxtaposed with experiences of positive emotions or situations, and patients challenged to use positive emotions to combat negative ones. Special emphasis will be placed on coping with negative emotions in conflict situations, and patients will process healthy conflict resolution skills.  Therapeutic Goals: Patient will identify two positive emotions or experiences to reflect on in order to balance out negative emotions:  Patient will label two or more emotions that they find the most difficult to experience:  Patient will be able to demonstrate positive conflict resolution skills through discussion or role plays:   Summary of Patient Progress: Patient was present in group, however, did not engage in group discussion.   Therapeutic Modalities:   Cognitive Behavioral Therapy Feelings Identification Dialectical Behavioral Therapy   Harden Mo, LCSW

## 2023-10-12 NOTE — [facility] Suicide Risk Assessment (Signed)
[facility] INPATIENT:  Family/Significant Other Suicide Prevention Education  Suicide Prevention Education:  Patient Refusal for Family/Significant Other Suicide Prevention Education: The patient Robert Chan has refused to provide written consent for family/significant other to be provided Family/Significant Other Suicide Prevention Education during admission and/or prior to discharge.  Physician notified. SPE completed with pt, as pt refused to consent to family contact. SPI pamphlet provided to pt and pt was encouraged to share information with support network, ask questions, and talk about any concerns relating to SPE. Pt denies access to guns/firearms and verbalized understanding of information provided. Mobile Crisis information also provided to pt.    Harden Mo 10/12/2023, 12:58 PM

## 2023-10-12 NOTE — BH IP Treatment Plan (Signed)
Interdisciplinary Treatment and Diagnostic Plan Update  10/12/2023 Time of Session: 9:27AM Robert Chan MRN: [id number]  Principal Diagnosis: <principal problem not specified>  Secondary Diagnoses: Active Problems:   MDD (major depressive disorder), recurrent episode, severe (HCC)   Current Medications:  Current Facility-Administered Medications  Medication Dose Route Frequency Provider Last Rate Last Admin   acetaminophen (TYLENOL) tablet 650 mg  650 mg Oral Q6H PRN Starkes-Perry, Juel Burrow, FNP       alum & mag hydroxide-simeth (MAALOX/MYLANTA) 200-200-20 MG/5ML suspension 30 mL  30 mL Oral Q4H PRN Starkes-Perry, Juel Burrow, FNP       atorvastatin (LIPITOR) tablet 80 mg  80 mg Oral Daily Starkes-Perry, Juel Burrow, FNP       latanoprost (XALATAN) 0.005 % ophthalmic solution 1 drop  1 drop Both Eyes QHS Maryagnes Amos, FNP   1 drop at 10/11/23 2114   lidocaine (LIDODERM) 5 % 1 patch  1 patch Transdermal Q24H Starkes-Perry, Juel Burrow, FNP       magnesium hydroxide (MILK OF MAGNESIA) suspension 30 mL  30 mL Oral Daily PRN Starkes-Perry, Juel Burrow, FNP       mometasone-formoterol (DULERA) 200-5 MCG/ACT inhaler 2 puff  2 puff Inhalation BID Maryagnes Amos, FNP   2 puff at 10/12/23 0805   OLANZapine zydis (ZYPREXA) disintegrating tablet 5 mg  5 mg Oral TID PRN Maryagnes Amos, FNP       pantoprazole (PROTONIX) EC tablet 20 mg  20 mg Oral Daily Starkes-Perry, Juel Burrow, FNP       rivaroxaban (XARELTO) tablet 10 mg  10 mg Oral Daily Starkes-Perry, Juel Burrow, FNP       senna-docusate (Senokot-S) tablet 1 tablet  1 tablet Oral QHS PRN Starkes-Perry, Juel Burrow, FNP       PTA Medications: Medications Prior to Admission  Medication Sig Dispense Refill Last Dose   albuterol (VENTOLIN HFA) 108 (90 Base) MCG/ACT inhaler Inhale 2 puffs into the lungs every 6 (six) hours as needed for shortness of breath. 18 g 0    ARIPiprazole (ABILIFY) 5 MG tablet Take 1 tablet (5 mg total) by mouth daily.  (Patient not taking: Reported on 10/10/2023) 30 tablet 0    atorvastatin (LIPITOR) 80 MG tablet Take 1 tablet (80 mg total) by mouth daily. (Patient not taking: Reported on 10/10/2023) 30 tablet 0    diltiazem (CARDIZEM CD) 180 MG 24 hr capsule Take 1 capsule (180 mg total) by mouth daily. (Patient not taking: Reported on 10/10/2023) 30 capsule 0    latanoprost (XALATAN) 0.005 % ophthalmic solution Place 1 drop into both eyes at bedtime. (Patient not taking: Reported on 10/10/2023) 2.5 mL 12    lidocaine (LIDODERM) 5 % Place 1 patch onto the skin daily. Remove & Discard patch within 12 hours or as directed by MD      mometasone-formoterol (DULERA) 200-5 MCG/ACT AERO Inhale 2 puffs into the lungs 2 (two) times daily. (Patient not taking: Reported on 10/10/2023) 1 each 1    traZODone (DESYREL) 50 MG tablet Take 1 tablet (50 mg total) by mouth at bedtime as needed for sleep. (Patient not taking: Reported on 10/10/2023) 30 tablet 0     Patient Stressors: Marital or family conflict   Medication change or noncompliance    Patient Strengths: Ability for insight  Forensic psychologist fund of knowledge  Motivation for treatment/growth   Treatment Modalities: Medication Management, Group therapy, Case management,  1 to 1 session with clinician, Psychoeducation, Recreational therapy.  Physician Treatment Plan for Primary Diagnosis: <principal problem not specified> Long Term Goal(s):     Short Term Goals:    Medication Management: Evaluate patient's response, side effects, and tolerance of medication regimen.  Therapeutic Interventions: 1 to 1 sessions, Unit Group sessions and Medication administration.  Evaluation of Outcomes: Not Met  Physician Treatment Plan for Secondary Diagnosis: Active Problems:   MDD (major depressive disorder), recurrent episode, severe (HCC)  Long Term Goal(s):     Short Term Goals:       Medication Management: Evaluate patient's response, side effects, and  tolerance of medication regimen.  Therapeutic Interventions: 1 to 1 sessions, Unit Group sessions and Medication administration.  Evaluation of Outcomes: Not Met   RN Treatment Plan for Primary Diagnosis: <principal problem not specified> Long Term Goal(s): Knowledge of disease and therapeutic regimen to maintain health will improve  Short Term Goals: Ability to demonstrate self-control, Ability to participate in decision making will improve, Ability to verbalize feelings will improve, Ability to disclose and discuss suicidal ideas, Ability to identify and develop effective coping behaviors will improve, and Compliance with prescribed medications will improve  Medication Management: RN will administer medications as ordered by provider, will assess and evaluate patient's response and provide education to patient for prescribed medication. RN will report any adverse and/or side effects to prescribing provider.  Therapeutic Interventions: 1 on 1 counseling sessions, Psychoeducation, Medication administration, Evaluate responses to treatment, Monitor vital signs and CBGs as ordered, Perform/monitor CIWA, COWS, AIMS and Fall Risk screenings as ordered, Perform wound care treatments as ordered.  Evaluation of Outcomes: Not Met   LCSW Treatment Plan for Primary Diagnosis: <principal problem not specified> Long Term Goal(s): Safe transition to appropriate next level of care at discharge, Engage patient in therapeutic group addressing interpersonal concerns.  Short Term Goals: Engage patient in aftercare planning with referrals and resources, Increase social support, Increase ability to appropriately verbalize feelings, Increase emotional regulation, Facilitate acceptance of mental health diagnosis and concerns, Facilitate patient progression through stages of change regarding substance use diagnoses and concerns, Identify triggers associated with mental health/substance abuse issues, and Increase  skills for wellness and recovery  Therapeutic Interventions: Assess for all discharge needs, 1 to 1 time with Social worker, Explore available resources and support systems, Assess for adequacy in community support network, Educate family and significant other(s) on suicide prevention, Complete Psychosocial Assessment, Interpersonal group therapy.  Evaluation of Outcomes: Not Met   Progress in Treatment: Attending groups: Yes. Participating in groups: Yes. Taking medication as prescribed: Yes. Toleration medication: Yes. Family/Significant other contact made: No, will contact:  once permission has been granted Patient understands diagnosis: Yes. Discussing patient identified problems/goals with staff: Yes. Medical problems stabilized or resolved: Yes. Denies suicidal/homicidal ideation: Yes. Issues/concerns per patient self-inventory: No. Other: none  New problem(s) identified: No, Describe:  none  New Short Term/Long Term Goal(s): detox, elimination of symptoms of psychosis, medication management for mood stabilization; elimination of SI thoughts; development of comprehensive mental wellness/sobriety plan.   Patient Goals:  "not come back"  Discharge Plan or Barriers: Patient reports plans to return to his home at discharge. He reports plans to begin aftercare treatment once discharged.    Reason for Continuation of Hospitalization: Anxiety Depression Homicidal ideation Medication stabilization Suicidal ideation  Estimated Length of Stay:  1-7 days  Last 3 [place] Suicide Severity Risk Score: Flowsheet Row Admission (Current) from 10/11/2023 in [facility] BE[facility] ED to Hosp-Admission (Discharged) from 10/10/2023 in [place] [facility]  Unit ED from 09/16/2023 in [facility]  C-SSRS RISK CATEGORY No Risk High Risk No Risk       Last PHQ 2/9 Scores:    09/21/2023    2:39 PM 09/21/2023    6:56 AM 09/16/2023    2:19 PM   Depression screen PHQ 2/9  Decreased Interest 1 0 0  Down, Depressed, Hopeless 0 0 1  PHQ - 2 Score 1 0 1    Scribe for Treatment Team: Harden Mo, Alexander Mt 10/12/2023 9:47 AM
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
## 2023-10-12 NOTE — Progress Notes (Signed)
   10/12/23 0000  Psych Admission Type (Psych Patients Only)  Admission Status Voluntary  Psychosocial Assessment  Patient Complaints Substance abuse  Eye Contact Fair  Facial Expression Sad;Worried  Affect Appropriate to circumstance  Speech Logical/coherent  Interaction Assertive  Motor Activity Slow  Appearance/Hygiene Unremarkable  Behavior Characteristics Appropriate to situation;Cooperative;Calm  Mood Pleasant  Thought Process  Coherency WDL  Content WDL  Delusions WDL  Perception WDL  Hallucination None reported or observed  Judgment WDL  Confusion None  Danger to Self  Current suicidal ideation? Denies  Agreement Not to Harm Self Yes  Description of Agreement Verbal  Danger to Others  Danger to Others None reported or observed

## 2023-10-12 NOTE — BHH Suicide Risk Assessment (Signed)
[facility] Admission Suicide Risk Assessment   Nursing information obtained from:  Patient Demographic factors:  Male, Age 67 or older Risk Reduction Factors:  Sense of responsibility to family   Principal Problem: <principal problem not specified> Diagnosis:  Active Problems:   MDD (major depressive disorder), recurrent episode, severe (HCC)  Subjective Data: Robert Chan is a 67 y.o. male who was initially admitted medically for 10/10/2023  for 2 days of chest pain and week of SI/HI. He carries the psychiatric diagnoses of MDD, severe, recurrent, without psychosis and has a past medical history of  atherosclerotic CAD, COPD, MDD without psychosis, hypertension, hx of cocaine use, history of NSTEMI 06/2020, underwent Left cardiac cath showed 30% prox LAD stenosis and 20% mid RCA stenosis .  Patient is transferred to [facility] after medical clearance, for stabilization of suicidal thoughts and depression    Alcohol Use Disorder Identification Test Final Score (AUDIT): 1 The "Alcohol Use Disorders Identification Test", Guidelines for Use in Primary Care, Second Edition.  World Science writer [facility]). Score between 0-7:  no or low risk or alcohol related problems. Score between 8-15:  moderate risk of alcohol related problems. Score between 16-19:  high risk of alcohol related problems. Score 20 or above:  warrants further diagnostic evaluation for alcohol dependence and treatment.   CLINICAL FACTORS:   Depression:   Anhedonia Hopelessness Impulsivity Insomnia Alcohol/Substance Abuse/Dependencies Previous Psychiatric Diagnoses and Treatments   Musculoskeletal: Strength & Muscle Tone: within normal limits Gait & Station: normal Patient leans: N/A  Psychiatric Specialty Exam:  Presentation  General Appearance:  Appropriate for Environment  Eye Contact: Fair  Speech: Normal Rate  Speech Volume: Normal  Handedness: Right   Mood and Affect   Mood: Depressed  Affect: Constricted   Thought Process  Thought Processes: Coherent  Descriptions of Associations:Intact  Orientation:Full (Time, Place and Person)  Thought Content: Positive for suicide thoughts  History of Schizophrenia/Schizoaffective disorder:No  Duration of Psychotic Symptoms:N/A  Hallucinations:Hallucinations: None  Ideas of Reference:None  Suicidal Thoughts: Yes  Homicidal Thoughts: Denies   Sensorium  Memory: Immediate Good; Recent Good; Remote Good  Judgment: Poor  Insight: Fair   Chartered certified accountant: Fair  Attention Span: Fair  Recall: [employer] of Knowledge: Fair  Language: Fair   Psychomotor Activity  Psychomotor Activity: Psychomotor Activity: Normal   Assets  Assets: Social Support; Desire for Improvement   Sleep  Sleep: Poor    Physical Exam: Physical Exam Constitutional:      Appearance: Normal appearance.  HENT:     Head: Normocephalic and atraumatic.     Nose: Nose normal.  Eyes:     Pupils: Pupils are equal, round, and reactive to light.  Cardiovascular:     Rate and Rhythm: Regular rhythm.  Pulmonary:     Effort: Pulmonary effort is normal.  Skin:    General: Skin is warm.  Neurological:     General: No focal deficit present.     Mental Status: He is alert and oriented to person, place, and time.    Review of Systems  Constitutional:  Negative for chills and fever.  HENT:  Negative for hearing loss and sore throat.   Eyes:  Negative for blurred vision and double vision.  Respiratory:  Negative for cough and shortness of breath.   Cardiovascular:  Negative for chest pain.  Gastrointestinal:  Negative for nausea and vomiting.  Neurological:  Negative for dizziness and speech change.  Psychiatric/Behavioral:  Positive for depression, substance abuse and suicidal  ideas. The patient is nervous/anxious and has insomnia.    Blood pressure (!) 137/96, pulse 67,  temperature 98.1 F (36.7 C), resp. rate 18, height 6\' 4"  (1.93 m), weight 112.5 kg, SpO2 98%. Body mass index is 30.19 kg/m.   COGNITIVE FEATURES THAT CONTRIBUTE TO RISK:  Thought constriction (tunnel vision)    SUICIDE RISK:   Moderate:  Frequent suicidal ideation with limited intensity, and duration, some specificity in terms of plans, no associated intent, good self-control,   Suicide risk assessment   Patient has following modifiable risk factors for suicide: Denies access to guns, ( reports "you can buy gun anytime on streets)active suicidal ideation, under treated depression , social isolation, medication noncompliance,   Patient has following non-modifiable or demographic risk factors for suicide: male gender, separation or divorce, history of suicide attempt, history of self harm behavior, and psychiatric hospitalization    PLAN OF CARE: Per H&P  I certify that inpatient services furnished can reasonably be expected to improve the patient's condition.   Lewanda Rife, MD 10/12/2023, 10:19 AM

## 2023-10-12 NOTE — Group Note (Signed)
Recreation Therapy Group Note   Group Topic:Coping Skills  Group Date: 10/12/2023 Start Time: 1530 End Time: 1615 Facilitators: Clinton Gallant, CTRS Location:  Day Room  Group Description: Chair Yoga. LRT and patients discussed the benefits of yoga and how it differs from strength exercises. LRT educated patients on the mental and physical benefits of yoga and deep breathing and how it can be used as a Associate Professor. LRT and patients followed along to a guided yoga session on the television that focused on all parts of the body, as well as deep breathing. Pt encouraged to stop movement at any time if they feel discomfort or pain.   Goal Area(s) Addressed: Patient will practice using relaxation technique. Patient will identify a new coping skill.  Patient will follow multistep directions to reduce anxiety and stress.   Affect/Mood: Appropriate   Participation Level: Active   Participation Quality: Independent   Behavior: Appropriate, Calm, and Cooperative   Speech/Thought Process: Coherent   Insight: Fair   Judgement: Good   Modes of Intervention: Activity   Patient Response to Interventions:  Attentive, Engaged, and Receptive   Education Outcome:  Acknowledges education   Clinical Observations/Individualized Feedback: Tildon was active in their participation of session activities and group discussion. Pt completed all exercises shown. Pt interacted well with LRT and peers duration of session.    Plan: Continue to engage patient in RT group sessions 2-3x/week.   Rosina Lowenstein, LRT, CTRS 10/12/2023 5:17 PM

## 2023-10-12 NOTE — Plan of Care (Signed)
  Problem: Education: Goal: Knowledge of General Education information will improve Description Including pain rating scale, medication(s)/side effects and non-pharmacologic comfort measures Outcome: Progressing   Problem: Health Behavior/Discharge Planning: Goal: Ability to manage health-related needs will improve Outcome: Progressing   

## 2023-10-12 NOTE — Progress Notes (Signed)
   10/12/23 0800  Psych Admission Type (Psych Patients Only)  Admission Status Voluntary  Psychosocial Assessment  Patient Complaints None  Eye Contact Fair  Facial Expression Flat  Affect Appropriate to circumstance  Speech Logical/coherent  Interaction Assertive  Motor Activity Slow  Appearance/Hygiene Unremarkable  Behavior Characteristics Cooperative  Mood Pleasant  Thought Process  Coherency WDL  Content WDL  Delusions WDL  Perception WDL  Hallucination None reported or observed  Judgment WDL  Confusion WDL  Danger to Self  Current suicidal ideation? Denies  Agreement Not to Harm Self Yes  Description of Agreement verbal  Danger to Others  Danger to Others None reported or observed

## 2023-10-13 DIAGNOSIS — F1410 Cocaine abuse, uncomplicated: Secondary | ICD-10-CM | POA: Diagnosis not present

## 2023-10-13 DIAGNOSIS — F332 Major depressive disorder, recurrent severe without psychotic features: Secondary | ICD-10-CM | POA: Diagnosis not present

## 2023-10-13 MED ORDER — DILTIAZEM HCL ER COATED BEADS 180 MG PO CP24
180.0000 mg | ORAL_CAPSULE | Freq: Every day | ORAL | Status: DC
  Administered 2023-10-13 – 2023-11-01 (×20): 180 mg via ORAL
  Filled 2023-10-13 (×20): qty 1

## 2023-10-13 MED ORDER — DOCUSATE SODIUM 100 MG PO CAPS
100.0000 mg | ORAL_CAPSULE | Freq: Two times a day (BID) | ORAL | Status: DC
  Administered 2023-10-13 – 2023-10-31 (×33): 100 mg via ORAL
  Filled 2023-10-13 (×39): qty 1

## 2023-10-13 NOTE — Group Note (Signed)
Date:  10/13/2023 Time:  2:13 AM  Group Topic/Focus:  Recovery Goals:   The focus of this group is to identify appropriate goals for recovery and establish a plan to achieve them. Wrap-Up Group:   The focus of this group is to help patients review their daily goal of treatment and discuss progress on daily workbooks.    Participation Level:  Active  Participation Quality:  Sharing  Affect:  Appropriate  Cognitive:  Appropriate  Insight: Good  Engagement in Group:  Engaged  Modes of Intervention:  Discussion  Additional Comments:    Dallie Piles Holli Rengel 10/13/2023, 2:13 AM

## 2023-10-13 NOTE — Progress Notes (Signed)
 Patient pleasant and cooperative.  Endorses anxiety and depression.  Endorses passive SI. Contracts for safety on the unit.  Denies HI and AVH.  Denies pain.    Compliant with scheduled medications. 15 mijn checks in place for safety.  Patient present in the milieu.  Appropriate interaction with peers and staff.    Requesting PRN medication for constipation.  Colace given as ordered.

## 2023-10-13 NOTE — Plan of Care (Signed)
  Problem: Education: Goal: Knowledge of General Education information will improve Description: Including pain rating scale, medication(s)/side effects and non-pharmacologic comfort measures Outcome: Progressing   Problem: Activity: Goal: Risk for activity intolerance will decrease Outcome: Progressing   Problem: Nutrition: Goal: Adequate nutrition will be maintained Outcome: Progressing   

## 2023-10-13 NOTE — Progress Notes (Signed)
[facility] MD Progress Note  10/13/2023  Robert Chan  MRN:  [id number]  Robert Chan is a 67 y.o. male who was initially admitted medically for 10/10/2023  for 2 days of chest pain and week of SI/HI. He carries the psychiatric diagnoses of MDD, severe, recurrent, without psychosis and has a past medical history of  atherosclerotic CAD, COPD, MDD without psychosis, hypertension, hx of cocaine use, history of NSTEMI 06/2020, underwent Left cardiac cath showed 30% prox LAD stenosis and 20% mid RCA stenosis .   Subjective:  Chart reviewed, patient's case discussed in multidisciplinary meeting, patient seen during rounds.  Not much change in mental status.  Patient continues to report depressed mood with suicide thoughts.  Patient denies homicidal ideations today, he is said his hope is that his daughter and his son-in-law  "work out on their differences".  Patient reports he slept fine last night.  He is appetite is improving.  He denies auditory visual hallucinations.     Principal Problem: MDD (major depressive disorder), recurrent episode, severe (HCC) Diagnosis: Principal Problem:   MDD (major depressive disorder), recurrent episode, severe (HCC) Active Problems:   Cocaine abuse (HCC)    Past Psychiatric History: Patient has a long history of depression and cocaine use. Patient was admitted to this facility in October. 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   Depression    Elevated serum creatinine 08/28/2019   Homelessness 06/19/2020   Homicidal ideations 05/25/2023   Hypertension    Major depressive disorder, recurrent episode, severe  (HCC) 08/11/2022   Major depressive disorder, single episode, 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/10/2022   MDD (major depressive disorder), recurrent severe, 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   NSTEMI (non-ST elevated myocardial infarction) (HCC)    Schizoaffective disorder (HCC) 11/08/2022   Status post incision and drainage 04/22/2021   Suicidal ideation 06/05/2020   Suicidal ideation 06/05/2020    Past Surgical History:  Procedure Laterality Date   APPENDECTOMY     EYE SURGERY     LEFT HEART CATH AND CORONARY ANGIOGRAPHY N/A 06/05/2020   Procedure: LEFT HEART CATH AND CORONARY ANGIOGRAPHY;  Surgeon: Elder Negus, MD;  Location: [facility];  Service: Cardiovascular;  Laterality: N/A;   PROSTATE SURGERY     Family History:  Family History  Problem Relation Age of Onset   Hypertension Mother    Diabetes Mother    Hypertension Father    Hypertension Sister    Family Psychiatric  History: Patient reports history brothers committed suicide by overdose on drugs  Social History:  Social History   Substance and Sexual Activity  Alcohol Use Yes   Comment: 4 40oz beer a week     Social History   Substance and Sexual Activity  Drug Use Yes   Types: Cocaine   Comment: cocaine use once a month, last use 6 mos ago    Social History   Socioeconomic History   Marital status: Single    Spouse name: Not on file   Number of children: Not on file   Years of education: Not on file   Highest education level: Not  on file  Occupational History   Not on file  Tobacco Use   Smoking status: Some Days    Current packs/day: 0.00    Types: Cigarettes    Last attempt to quit: 12/25/2020    Years since quitting: 2.8   Smokeless tobacco: Never   Tobacco comments:    Smokes a couple cigarettes every now and then  Vaping Use   Vaping status: Never Used  Substance and Sexual Activity   Alcohol use: Yes    Comment: 4 40oz beer a week   Drug use: Yes    Types: Cocaine    Comment: cocaine use  once a month, last use 6 mos ago   Sexual activity: Not Currently  Other Topics Concern   Not on file  Social History Narrative   His mother passed at age 36 (when he was 9 years old) and his father raised him and his siblings   His parents were pastors as a lot of his sisters and other family members are today   There was a total of 13 children in his family Had 79 sisters, one sister deceased , Had 3 brothers, all brothers deceased   He is the youngest of the 70 and was "always in trouble in my younger years"   Family still live in [place], [place], some in [place], [place]   Has a Daughter and son with a total of 10 grandchildren (6 grand daughters local and 4 grandsons)    Values family   Married twice, present wife incarcerated   Social Drivers of Corporate investment banker Strain: Low Risk  (10/19/2022)   Overall Financial Resource Strain (CARDIA)    Difficulty of Paying Living Expenses: Not hard at all  Food Insecurity: No Food Insecurity (10/11/2023)   Hunger Vital Sign    Worried About Running Out of Food in the Last Year: Never true    Ran Out of Food in the Last Year: Never true  Recent Concern: Food Insecurity - Food Insecurity Present (09/16/2023)   Hunger Vital Sign    Worried About Running Out of Food in the Last Year: Sometimes true    Ran Out of Food in the Last Year: Sometimes true  Transportation Needs: No Transportation Needs (10/11/2023)   PRAPARE - Administrator, Civil Service (Medical): No    Lack of Transportation (Non-Medical): No  Recent Concern: Transportation Needs - Unmet Transportation Needs (10/10/2023)   PRAPARE - Transportation    Lack of Transportation (Medical): Yes    Lack of Transportation (Non-Medical): Yes  Physical Activity: Inactive (10/19/2022)   Exercise Vital Sign    Days of Exercise per Week: 0 days    Minutes of Exercise per Session: 0 min  Stress: No Stress Concern Present (10/19/2022)   [name] of  Occupational Health - Occupational Stress Questionnaire    Feeling of Stress : Not at all  Social Connections: Moderately Integrated (03/31/2022)   Social Connection and Isolation Panel [NHANES]    Frequency of Communication with Friends and Family: Three times a week    Frequency of Social Gatherings with Friends and Family: Three times a week    Attends Religious Services: 1 to 4 times per year    Active Member of Clubs or Organizations: Yes    Attends Banker Meetings: 1 to 4 times per year    Marital Status: Never married  Sleep: Fair  Appetite:  Fair  Current Medications: Current Facility-Administered Medications  Medication Dose Route Frequency Provider Last Rate Last Admin   acetaminophen (TYLENOL) tablet 650 mg  650 mg Oral Q6H PRN Starkes-Perry, Juel Burrow, FNP       alum & mag hydroxide-simeth (MAALOX/MYLANTA) 200-200-20 MG/5ML suspension 30 mL  30 mL Oral Q4H PRN Starkes-Perry, Juel Burrow, FNP       atorvastatin (LIPITOR) tablet 80 mg  80 mg Oral Daily Maryagnes Amos, FNP   80 mg at 10/13/23 1032   diltiazem (CARDIZEM CD) 24 hr capsule 180 mg  180 mg Oral Daily Ronney Honeywell, MD       latanoprost (XALATAN) 0.005 % ophthalmic solution 1 drop  1 drop Both Eyes QHS Maryagnes Amos, FNP   1 drop at 10/12/23 2100   magnesium hydroxide (MILK OF MAGNESIA) suspension 30 mL  30 mL Oral Daily PRN Maryagnes Amos, FNP   30 mL at 10/12/23 1718   menthol-cetylpyridinium (CEPACOL) lozenge 3 mg  1 lozenge Oral PRN Lewanda Rife, MD   3 mg at 10/13/23 1056   mometasone-formoterol (DULERA) 200-5 MCG/ACT inhaler 2 puff  2 puff Inhalation BID Maryagnes Amos, FNP   2 puff at 10/13/23 0811   OLANZapine zydis (ZYPREXA) disintegrating tablet 5 mg  5 mg Oral TID PRN Maryagnes Amos, FNP       pantoprazole (PROTONIX) EC tablet 20 mg  20 mg Oral Daily Maryagnes Amos, FNP   20 mg at 10/13/23 1032   rivaroxaban  (XARELTO) tablet 10 mg  10 mg Oral Daily Maryagnes Amos, FNP   10 mg at 10/13/23 1032   senna-docusate (Senokot-S) tablet 1 tablet  1 tablet Oral QHS PRN Maryagnes Amos, FNP   1 tablet at 10/12/23 2102    Lab Results: No results found for this or any previous visit (from the past 48 hours).  Blood Alcohol level:  Lab Results  Component Value Date   ETH <10 10/10/2023   ETH <10 09/15/2023    Metabolic Disorder Labs: Lab Results  Component Value Date   HGBA1C 5.1 09/15/2023   MPG 99.67 09/15/2023   MPG 103 01/13/2023   No results found for: "PROLACTIN" Lab Results  Component Value Date   CHOL 159 09/15/2023   TRIG 53 09/15/2023   HDL 61 09/15/2023   CHOLHDL 2.6 09/15/2023   VLDL 11 09/15/2023   LDLCALC 87 09/15/2023   LDLCALC 99 01/13/2023    Physical Findings: AIMS:  , ,  ,  ,    CIWA:    COWS:     Musculoskeletal: Strength & Muscle Tone: within normal limits Gait & Station: normal Patient leans: N/A   Psychiatric Specialty Exam:   Presentation  General Appearance:  Appropriate for Environment   Eye Contact: Fair   Speech: Normal Rate   Speech Volume: Normal   Handedness: Right     Mood and Affect  Mood: Depressed   Affect: Constricted     Thought Process  Thought Processes: Coherent   Descriptions of Associations:Intact   Orientation:Full (Time, Place and Person)   Thought Content: Positive for suicide thoughts   History of Schizophrenia/Schizoaffective disorder:No   Duration of Psychotic Symptoms:N/A   Hallucinations:Hallucinations: None   Ideas of Reference:None   Suicidal Thoughts: Yes   Homicidal Thoughts: Denies     Sensorium  Memory: Immediate Good; Recent Good; Remote Good   Judgment: Poor   Insight: Development worker, international aid  Functions  Concentration: Fair   Attention Span: Fair   Recall: [employer] of Knowledge: Fair   Language: Fair     Psychomotor Activity  Psychomotor  Activity: Psychomotor Activity: Normal     Assets  Assets: Social Support; Desire for Improvement     Sleep  Sleep: Poor       Physical Exam: Physical Exam Constitutional:      Appearance: Normal appearance.  HENT:     Head: Normocephalic and atraumatic.     Nose: Nose normal.  Eyes:     Pupils: Pupils are equal, round, and reactive to light.  Cardiovascular:     Rate and Rhythm: Regular rhythm.  Pulmonary:     Effort: Pulmonary effort is normal.  Skin:    General: Skin is warm.  Neurological:     General: No focal deficit present.     Mental Status: He is alert and oriented to person, place, and time.      Review of Systems  Constitutional:  Negative for chills and fever.  HENT:  Negative for hearing loss and sore throat.   Eyes:  Negative for blurred vision and double vision.  Respiratory:  Negative for cough and shortness of breath.   Cardiovascular:  Negative for chest pain.  Gastrointestinal:  Negative for nausea and vomiting.  Neurological:  Negative for dizziness and speech change.  Psychiatric/Behavioral:  Positive for depression, substance abuse and suicidal ideas. The patient is nervous/anxious and has insomnia.    Blood pressure 137/82, pulse 77, temperature (!) 97 F (36.1 C), resp. rate 17, height 6\' 4"  (1.93 m), weight 112.5 kg, SpO2 100%. Body mass index is 30.19 kg/m.   Treatment Plan Summary: Daily contact with patient to assess and evaluate symptoms and progress in treatment and Medication management Patient is admitted to locked unit under safety precautions Patient has been on Abilify in the past, at this time patient is denying any psychotic symptoms will defer use of Abilify for now Continue on Remeron 7.5 mg at bedtime for depression and insomnia Patient was encouraged to abstain from alcohol and drug use Patient was encouraged to attend group and work on coping strategies Social worker consulted to get collateral and help with a safe  discharge plan.  Lewanda Rife, MD

## 2023-10-13 NOTE — Group Note (Signed)
Date:  10/13/2023 Time:  8:34 PM  Group Topic/Focus:  Personal Choices and Values:   The focus of this group is to help patients assess and explore the importance of values in their lives, how their values affect their decisions, how they express their values and what opposes their expression.    Participation Level:  Active  Participation Quality:  Appropriate  Affect:  Appropriate  Cognitive:  Appropriate  Insight: Good  Engagement in Group:  Engaged  Modes of Intervention:  Clarification  Additional Comments:    Burt Ek 10/13/2023, 8:34 PM

## 2023-10-13 NOTE — Progress Notes (Signed)
   10/13/23 0200  Psych Admission Type (Psych Patients Only)  Admission Status Voluntary  Psychosocial Assessment  Patient Complaints None  Eye Contact Fair  Facial Expression Flat  Affect Appropriate to circumstance  Speech Logical/coherent  Interaction Assertive  Motor Activity Slow  Appearance/Hygiene Unremarkable  Behavior Characteristics Cooperative  Mood Pleasant  Thought Process  Coherency WDL  Content WDL  Delusions WDL  Perception WDL  Hallucination None reported or observed  Judgment WDL  Confusion WDL  Danger to Self  Current suicidal ideation? Denies  Agreement Not to Harm Self Yes  Description of Agreement Verbal  Danger to Others  Danger to Others None reported or observed

## 2023-10-14 DIAGNOSIS — F332 Major depressive disorder, recurrent severe without psychotic features: Secondary | ICD-10-CM | POA: Diagnosis not present

## 2023-10-14 DIAGNOSIS — F1410 Cocaine abuse, uncomplicated: Secondary | ICD-10-CM | POA: Diagnosis not present

## 2023-10-14 MED ORDER — MIRTAZAPINE 15 MG PO TABS
7.5000 mg | ORAL_TABLET | Freq: Every day | ORAL | Status: DC
  Administered 2023-10-14: 7.5 mg via ORAL
  Filled 2023-10-14: qty 1

## 2023-10-14 MED ORDER — GUAIFENESIN 100 MG/5ML PO LIQD
5.0000 mL | Freq: Four times a day (QID) | ORAL | Status: DC | PRN
  Administered 2023-10-14 – 2023-10-28 (×16): 5 mL via ORAL
  Filled 2023-10-14 (×16): qty 10

## 2023-10-14 NOTE — Plan of Care (Signed)
  Problem: Education: Goal: Knowledge of General Education information will improve Description: Including pain rating scale, medication(s)/side effects and non-pharmacologic comfort measures Outcome: Progressing   Problem: Health Behavior/Discharge Planning: Goal: Ability to manage health-related needs will improve Outcome: Progressing   Problem: Clinical Measurements: Goal: Ability to maintain clinical measurements within normal limits will improve Outcome: Progressing  Patient compliant with treatment plan denies HI/A/VH endorses Passive SI with no plan. Medication compliant no adverse effects noted. C/O sleep disturbances. Support ongoing. Patient visible in Milieu this shift. Q 15 minutes safety checks ongoing. Patient remains safe.

## 2023-10-14 NOTE — Group Note (Signed)
Date:  10/14/2023 Time:  8:19 PM  Group Topic/Focus:  Self Esteem Action Plan:   The focus of this group is to help patients create a plan to continue to build self-esteem after discharge.    Participation Level:  Active  Participation Quality:  Appropriate  Affect:  Appropriate and Flat  Cognitive:  Appropriate  Insight: Good  Engagement in Group:  Engaged  Modes of Intervention:  Discussion  Additional Comments:    Burt Ek 10/14/2023, 8:19 PM

## 2023-10-14 NOTE — Group Note (Signed)
Date:  10/14/2023 Time:  3:44 PM  Group Topic/Focus:  Self Esteem Action Plan:   The focus of this group is to help patients create a plan to continue to build self-esteem after discharge. Spirituality:   The focus of this group is to discuss how one's spirituality can aide in recovery.    Participation Level:  Active  Participation Quality:  Appropriate, Attentive, Sharing, and Supportive  Affect:  Appropriate  Cognitive:  Alert, Appropriate, and Oriented  Insight: Appropriate and Good  Engagement in Group:  Engaged and Supportive  Modes of Intervention:  Clarification and Socialization  Additional Comments:     Alexis Frock 10/14/2023, 3:44 PM

## 2023-10-14 NOTE — Group Note (Signed)
Date:  10/14/2023 Time:  3:58 PM  Group Topic/Focus:  Managing Feelings:   The focus of this group is to identify what feelings patients have difficulty handling and develop a plan to handle them in a healthier way upon discharge. Rediscovering Joy:   The focus of this group is to explore various ways to relieve stress in a positive manner.    Participation Level:  Active  Participation Quality:  Appropriate, Attentive, Sharing, and Supportive  Affect:  Appropriate  Cognitive:  Alert, Appropriate, and Oriented  Insight: Appropriate and Good  Engagement in Group:  Engaged  Modes of Intervention:  Support  Additional Comments:     Alexis Frock 10/14/2023, 3:58 PM

## 2023-10-14 NOTE — Plan of Care (Signed)
  Problem: Health Behavior/Discharge Planning: Goal: Ability to manage health-related needs will improve Outcome: Progressing   Problem: Activity: Goal: Risk for activity intolerance will decrease Outcome: Progressing   Problem: Nutrition: Goal: Adequate nutrition will be maintained Outcome: Progressing   

## 2023-10-14 NOTE — Progress Notes (Signed)
[facility] MD Progress Note  10/14/2023  Robert Chan  MRN:  [id number]  Robert Chan is a 67 y.o. male who was initially admitted medically for 10/10/2023  for 2 days of chest pain and week of SI/HI. He carries the psychiatric diagnoses of MDD, severe, recurrent, without psychosis and has a past medical history of  atherosclerotic CAD, COPD, MDD without psychosis, hypertension, hx of cocaine use, history of NSTEMI 06/2020, underwent Left cardiac cath showed 30% prox LAD stenosis and 20% mid RCA stenosis .   Subjective:  Chart reviewed, patient's case discussed in multidisciplinary meeting, patient seen during rounds.  Not much change in mental status.  Patient continues to report depressed mood with suicide thoughts.  Denies HI. Patients reports congestion and cough. He is asking for Robitussin.  Patient reports he slept fine last night  He denies auditory visual hallucinations.   Principal Problem: MDD (major depressive disorder), recurrent episode, severe (HCC) Diagnosis: Principal Problem:   MDD (major depressive disorder), recurrent episode, severe (HCC) Active Problems:   Cocaine abuse (HCC)    Past Psychiatric History: Patient has a long history of depression and cocaine use. Patient was admitted to this facility in October. 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   Depression    Elevated serum creatinine 08/28/2019   Homelessness 06/19/2020   Homicidal ideations 05/25/2023   Hypertension    Major depressive disorder, recurrent episode, 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/11/2022   Major depressive disorder, single episode, 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/10/2022   MDD  (major depressive disorder), recurrent severe, 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   NSTEMI (non-ST elevated myocardial infarction) (HCC)    Schizoaffective disorder (HCC) 11/08/2022   Status post incision and drainage 04/22/2021   Suicidal ideation 06/05/2020   Suicidal ideation 06/05/2020    Past Surgical History:  Procedure Laterality Date   APPENDECTOMY     EYE SURGERY     LEFT HEART CATH AND CORONARY ANGIOGRAPHY N/A 06/05/2020   Procedure: LEFT HEART CATH AND CORONARY ANGIOGRAPHY;  Surgeon: Elder Negus, MD;  Location: [facility];  Service: Cardiovascular;  Laterality: N/A;   PROSTATE SURGERY     Family History:  Family History  Problem Relation Age of Onset   Hypertension Mother    Diabetes Mother    Hypertension Father    Hypertension Sister    Family Psychiatric  History: Patient reports history brothers committed suicide by overdose on drugs  Social History:  Social History   Substance and Sexual Activity  Alcohol Use Yes   Comment: 4 40oz beer a week     Social History   Substance and Sexual Activity  Drug Use Yes   Types: Cocaine   Comment: cocaine use once a month, last use 6 mos ago    Social History   Socioeconomic History   Marital status: Single    Spouse name: Not on file   Number of children: Not on file   Years of education: Not on file   Highest education level: Not on file  Occupational History   Not on file  Tobacco Use   Smoking status: Some Days  Current packs/day: 0.00    Types: Cigarettes    Last attempt to quit: 12/25/2020    Years since quitting: 2.8   Smokeless tobacco: Never   Tobacco comments:    Smokes a couple cigarettes every now and then  Vaping Use   Vaping status: Never Used  Substance and Sexual Activity   Alcohol use: Yes    Comment: 4 40oz beer a week   Drug use: Yes    Types: Cocaine    Comment: cocaine use once a month, last use 6 mos ago   Sexual activity: Not Currently  Other Topics Concern    Not on file  Social History Narrative   His mother passed at age 35 (when he was 63 years old) and his father raised him and his siblings   His parents were pastors as a lot of his sisters and other family members are today   There was a total of 13 children in his family Had 50 sisters, one sister deceased , Had 3 brothers, all brothers deceased   He is the youngest of the 75 and was "always in trouble in my younger years"   Family still live in [place], [place], some in [place], [place]   Has a Daughter and son with a total of 10 grandchildren (6 grand daughters local and 4 grandsons)    Values family   Married twice, present wife incarcerated   Social Drivers of Corporate investment banker Strain: Low Risk  (10/19/2022)   Overall Financial Resource Strain (CARDIA)    Difficulty of Paying Living Expenses: Not hard at all  Food Insecurity: No Food Insecurity (10/11/2023)   Hunger Vital Sign    Worried About Running Out of Food in the Last Year: Never true    Ran Out of Food in the Last Year: Never true  Recent Concern: Food Insecurity - Food Insecurity Present (09/16/2023)   Hunger Vital Sign    Worried About Running Out of Food in the Last Year: Sometimes true    Ran Out of Food in the Last Year: Sometimes true  Transportation Needs: No Transportation Needs (10/11/2023)   PRAPARE - Administrator, Civil Service (Medical): No    Lack of Transportation (Non-Medical): No  Recent Concern: Transportation Needs - Unmet Transportation Needs (10/10/2023)   PRAPARE - Transportation    Lack of Transportation (Medical): Yes    Lack of Transportation (Non-Medical): Yes  Physical Activity: Inactive (10/19/2022)   Exercise Vital Sign    Days of Exercise per Week: 0 days    Minutes of Exercise per Session: 0 min  Stress: No Stress Concern Present (10/19/2022)   [name] of Occupational Health - Occupational Stress Questionnaire    Feeling of Stress : Not at all  Social  Connections: Moderately Integrated (03/31/2022)   Social Connection and Isolation Panel [NHANES]    Frequency of Communication with Friends and Family: Three times a week    Frequency of Social Gatherings with Friends and Family: Three times a week    Attends Religious Services: 1 to 4 times per year    Active Member of Clubs or Organizations: Yes    Attends Banker Meetings: 1 to 4 times per year    Marital Status: Never married                         Sleep: Fair  Appetite:  Fair  Current Medications: Current Facility-Administered  Medications  Medication Dose Route Frequency Provider Last Rate Last Admin   acetaminophen (TYLENOL) tablet 650 mg  650 mg Oral Q6H PRN Starkes-Perry, Juel Burrow, FNP       alum & mag hydroxide-simeth (MAALOX/MYLANTA) 200-200-20 MG/5ML suspension 30 mL  30 mL Oral Q4H PRN Starkes-Perry, Juel Burrow, FNP       atorvastatin (LIPITOR) tablet 80 mg  80 mg Oral Daily Maryagnes Amos, FNP   80 mg at 10/14/23 1032   diltiazem (CARDIZEM CD) 24 hr capsule 180 mg  180 mg Oral Daily Lewanda Rife, MD   180 mg at 10/14/23 1032   docusate sodium (COLACE) capsule 100 mg  100 mg Oral BID Lewanda Rife, MD   100 mg at 10/14/23 1032   guaiFENesin (ROBITUSSIN) 100 MG/5ML liquid 5 mL  5 mL Oral Q6H PRN Lewanda Rife, MD   5 mL at 10/14/23 1525   latanoprost (XALATAN) 0.005 % ophthalmic solution 1 drop  1 drop Both Eyes QHS Maryagnes Amos, FNP   1 drop at 10/13/23 2107   magnesium hydroxide (MILK OF MAGNESIA) suspension 30 mL  30 mL Oral Daily PRN Maryagnes Amos, FNP   30 mL at 10/12/23 1718   menthol-cetylpyridinium (CEPACOL) lozenge 3 mg  1 lozenge Oral PRN Lewanda Rife, MD   3 mg at 10/14/23 1739   mometasone-formoterol (DULERA) 200-5 MCG/ACT inhaler 2 puff  2 puff Inhalation BID Maryagnes Amos, FNP   2 puff at 10/14/23 1031   OLANZapine zydis (ZYPREXA) disintegrating tablet 5 mg  5 mg Oral TID PRN Maryagnes Amos, FNP       pantoprazole (PROTONIX) EC tablet 20 mg  20 mg Oral Daily Maryagnes Amos, FNP   20 mg at 10/14/23 1032   rivaroxaban (XARELTO) tablet 10 mg  10 mg Oral Daily Maryagnes Amos, FNP   10 mg at 10/14/23 1032   senna-docusate (Senokot-S) tablet 1 tablet  1 tablet Oral QHS PRN Maryagnes Amos, FNP   1 tablet at 10/13/23 2107    Lab Results: No results found for this or any previous visit (from the past 48 hours).  Blood Alcohol level:  Lab Results  Component Value Date   ETH <10 10/10/2023   ETH <10 09/15/2023    Metabolic Disorder Labs: Lab Results  Component Value Date   HGBA1C 5.1 09/15/2023   MPG 99.67 09/15/2023   MPG 103 01/13/2023   No results found for: "PROLACTIN" Lab Results  Component Value Date   CHOL 159 09/15/2023   TRIG 53 09/15/2023   HDL 61 09/15/2023   CHOLHDL 2.6 09/15/2023   VLDL 11 09/15/2023   LDLCALC 87 09/15/2023   LDLCALC 99 01/13/2023    Physical Findings: AIMS:  , ,  ,  ,    CIWA:    COWS:     Musculoskeletal: Strength & Muscle Tone: within normal limits Gait & Station: normal Patient leans: N/A   Psychiatric Specialty Exam:   Presentation  General Appearance:  Appropriate for Environment   Eye Contact: Fair   Speech: Normal Rate   Speech Volume: Normal   Handedness: Right     Mood and Affect  Mood: Depressed   Affect: Constricted     Thought Process  Thought Processes: Coherent   Descriptions of Associations:Intact   Orientation:Full (Time, Place and Person)   Thought Content: Positive for suicide thoughts   History of Schizophrenia/Schizoaffective disorder:No   Duration of Psychotic Symptoms:N/A   Hallucinations:Hallucinations: None  Ideas of Reference:None   Suicidal Thoughts: Yes   Homicidal Thoughts: Denies     Sensorium  Memory: Immediate Good; Recent Good; Remote Good   Judgment: Poor   Insight: Fair     Producer, television/film/video: Fair   Attention Span: Fair   Recall: [employer] of Knowledge: Fair   Language: Fair     Psychomotor Activity  Psychomotor Activity: Psychomotor Activity: Normal     Assets  Assets: Social Support; Desire for Improvement     Sleep  Sleep: Poor       Physical Exam: Physical Exam Constitutional:      Appearance: Normal appearance.  HENT:     Head: Normocephalic and atraumatic.     Nose: Nose normal.  Eyes:     Pupils: Pupils are equal, round, and reactive to light.  Cardiovascular:     Rate and Rhythm: Regular rhythm.  Pulmonary:     Effort: Pulmonary effort is normal.  Skin:    General: Skin is warm.  Neurological:     General: No focal deficit present.     Mental Status: He is alert and oriented to person, place, and time.      Review of Systems  Constitutional:  Negative for chills and fever.  HENT:  Negative for hearing loss and sore throat.   Eyes:  Negative for blurred vision and double vision.  Respiratory:  Negative for cough and shortness of breath.   Cardiovascular:  Negative for chest pain.  Gastrointestinal:  Negative for nausea and vomiting.  Neurological:  Negative for dizziness and speech change.  Psychiatric/Behavioral:  Positive for depression, substance abuse and suicidal ideas. The patient is nervous/anxious and has insomnia.    Blood pressure 135/64, pulse 67, temperature 97.9 F (36.6 C), resp. rate 18, height 6\' 4"  (1.93 m), weight 112.5 kg, SpO2 100%. Body mass index is 30.19 kg/m.   Treatment Plan Summary: Daily contact with patient to assess and evaluate symptoms and progress in treatment and Medication management Patient is admitted to locked unit under safety precautions Patient has been on Abilify in the past, at this time patient is denying any psychotic symptoms will defer use of Abilify for now Continue on Remeron 7.5 mg at bedtime for depression and insomnia Patient was encouraged to abstain from  alcohol and drug use Patient was encouraged to attend group and work on coping strategies Social worker consulted to get collateral and help with a safe discharge plan.  Lewanda Rife, MD

## 2023-10-14 NOTE — Progress Notes (Signed)
 Patient pleasant and cooperative with staff.  Endorses anxiety and depression.  Endorses passive SI.  Contracts for safety on the unit.  Denies HI and AVH.  Reports he has a cough.  Dr. Marval Regal made aware. Also requesting PRN Trazodone at bedtime. Dr. Marval Regal made aware.   Compliant with scheduled medications. 15 min checks in place for safety.  Patient is present in the milieu.  Interacts appropriately with peers.

## 2023-10-15 DIAGNOSIS — F1410 Cocaine abuse, uncomplicated: Secondary | ICD-10-CM | POA: Diagnosis not present

## 2023-10-15 DIAGNOSIS — F332 Major depressive disorder, recurrent severe without psychotic features: Secondary | ICD-10-CM | POA: Diagnosis not present

## 2023-10-15 MED ORDER — MIRTAZAPINE 15 MG PO TABS
15.0000 mg | ORAL_TABLET | Freq: Every day | ORAL | Status: DC
  Administered 2023-10-15 – 2023-10-16 (×2): 15 mg via ORAL
  Filled 2023-10-15 (×2): qty 1

## 2023-10-15 NOTE — Group Note (Signed)
Date:  10/15/2023 Time:  8:57 PM  Group Topic/Focus:  Personal Choices and Values:   The focus of this group is to help patients assess and explore the importance of values in their lives, how their values affect their decisions, how they express their values and what opposes their expression.    Participation Level:  Active  Participation Quality:  Appropriate  Affect:  Appropriate  Cognitive:  Appropriate  Insight: Appropriate and Good  Engagement in Group:  Engaged  Modes of Intervention:  Discussion  Additional Comments:    Burt Ek 10/15/2023, 8:57 PM

## 2023-10-15 NOTE — Progress Notes (Signed)
[facility] MD Progress Note  10/15/2023  Robert Chan  MRN:  [id number]  Robert Chan is a 67 y.o. male who was initially admitted medically for 10/10/2023  for 2 days of chest pain and week of SI/HI. He carries the psychiatric diagnoses of MDD, severe, recurrent, without psychosis and has a past medical history of  atherosclerotic CAD, COPD, MDD without psychosis, hypertension, hx of cocaine use, history of NSTEMI 06/2020, underwent Left cardiac cath showed 30% prox LAD stenosis and 20% mid RCA stenosis .   Subjective:  Chart reviewed, patient's case discussed in multidisciplinary meeting, patient seen during rounds.  Not much change in mental status.  Patient reports" I feel the same".  Patient continues to report depressed mood with suicide thoughts.  Denies HI. Patient reports he slept fine last night  He denies auditory visual hallucinations.  Patient has been attending groups and working on coping strategies.   Principal Problem: MDD (major depressive disorder), recurrent episode, severe (HCC) Diagnosis: Principal Problem:   MDD (major depressive disorder), recurrent episode, severe (HCC) Active Problems:   Cocaine abuse (HCC)    Past Psychiatric History: Patient has a long history of depression and cocaine use. Patient was admitted to this facility in October. 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   Depression    Elevated serum creatinine 08/28/2019   Homelessness 06/19/2020   Homicidal ideations 05/25/2023   Hypertension    Major depressive disorder, recurrent episode, 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/11/2022   Major depressive disorder, single  episode, 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/10/2022   MDD (major depressive disorder), recurrent severe, 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   NSTEMI (non-ST elevated myocardial infarction) (HCC)    Schizoaffective disorder (HCC) 11/08/2022   Status post incision and drainage 04/22/2021   Suicidal ideation 06/05/2020   Suicidal ideation 06/05/2020    Past Surgical History:  Procedure Laterality Date   APPENDECTOMY     EYE SURGERY     LEFT HEART CATH AND CORONARY ANGIOGRAPHY N/A 06/05/2020   Procedure: LEFT HEART CATH AND CORONARY ANGIOGRAPHY;  Surgeon: Elder Negus, MD;  Location: [facility] IN[facility]AS[facility];  Service: Cardiovascular;  Laterality: N/A;   PROSTATE SURGERY     Family History:  Family History  Problem Relation Age of Onset   Hypertension Mother    Diabetes Mother    Hypertension Father    Hypertension Sister    Family Psychiatric  History: Patient reports history brothers committed suicide by overdose on drugs  Social History:  Social History   Substance and Sexual Activity  Alcohol Use Yes   Comment: 4 40oz beer a week     Social History   Substance and Sexual Activity  Drug Use Yes   Types: Cocaine   Comment: cocaine use once a month, last use 6 mos ago    Social History   Socioeconomic History   Marital status: Single    Spouse name: Not on file   Number of children: Not on file   Years of education: Not on file   Highest education level: Not on file  Occupational History   Not on file  Tobacco  Use   Smoking status: Some Days    Current packs/day: 0.00    Types: Cigarettes    Last attempt to quit: 12/25/2020    Years since quitting: 2.8   Smokeless tobacco: Never   Tobacco comments:    Smokes a couple cigarettes every now and then  Vaping Use   Vaping status: Never Used  Substance and Sexual Activity   Alcohol use: Yes    Comment: 4 40oz beer a week   Drug use: Yes    Types: Cocaine    Comment: cocaine use once a month, last use 6 mos ago   Sexual activity:  Not Currently  Other Topics Concern   Not on file  Social History Narrative   His mother passed at age 58 (when he was 73 years old) and his father raised him and his siblings   His parents were pastors as a lot of his sisters and other family members are today   There was a total of 13 children in his family Had 35 sisters, one sister deceased , Had 3 brothers, all brothers deceased   He is the youngest of the 9 and was "always in trouble in my younger years"   Family still live in [place], [place], some in [place], [place]   Has a Daughter and son with a total of 10 grandchildren (6 grand daughters local and 4 grandsons)    Values family   Married twice, present wife incarcerated   Social Drivers of Corporate investment banker Strain: Low Risk  (10/19/2022)   Overall Financial Resource Strain (CARDIA)    Difficulty of Paying Living Expenses: Not hard at all  Food Insecurity: No Food Insecurity (10/11/2023)   Hunger Vital Sign    Worried About Running Out of Food in the Last Year: Never true    Ran Out of Food in the Last Year: Never true  Recent Concern: Food Insecurity - Food Insecurity Present (09/16/2023)   Hunger Vital Sign    Worried About Running Out of Food in the Last Year: Sometimes true    Ran Out of Food in the Last Year: Sometimes true  Transportation Needs: No Transportation Needs (10/11/2023)   PRAPARE - Administrator, Civil Service (Medical): No    Lack of Transportation (Non-Medical): No  Recent Concern: Transportation Needs - Unmet Transportation Needs (10/10/2023)   PRAPARE - Transportation    Lack of Transportation (Medical): Yes    Lack of Transportation (Non-Medical): Yes  Physical Activity: Inactive (10/19/2022)   Exercise Vital Sign    Days of Exercise per Week: 0 days    Minutes of Exercise per Session: 0 min  Stress: No Stress Concern Present (10/19/2022)   [name] of Occupational Health - Occupational Stress Questionnaire     Feeling of Stress : Not at all  Social Connections: Moderately Integrated (03/31/2022)   Social Connection and Isolation Panel [NHANES]    Frequency of Communication with Friends and Family: Three times a week    Frequency of Social Gatherings with Friends and Family: Three times a week    Attends Religious Services: 1 to 4 times per year    Active Member of Clubs or Organizations: Yes    Attends Banker Meetings: 1 to 4 times per year    Marital Status: Never married                         Sleep:  Fair  Appetite:  Fair  Current Medications: Current Facility-Administered Medications  Medication Dose Route Frequency Provider Last Rate Last Admin   acetaminophen (TYLENOL) tablet 650 mg  650 mg Oral Q6H PRN Starkes-Perry, Juel Burrow, FNP       alum & mag hydroxide-simeth (MAALOX/MYLANTA) 200-200-20 MG/5ML suspension 30 mL  30 mL Oral Q4H PRN Starkes-Perry, Juel Burrow, FNP       atorvastatin (LIPITOR) tablet 80 mg  80 mg Oral Daily Maryagnes Amos, FNP   80 mg at 10/15/23 0929   diltiazem (CARDIZEM CD) 24 hr capsule 180 mg  180 mg Oral Daily Lewanda Rife, MD   180 mg at 10/15/23 0929   docusate sodium (COLACE) capsule 100 mg  100 mg Oral BID Lewanda Rife, MD   100 mg at 10/15/23 0929   guaiFENesin (ROBITUSSIN) 100 MG/5ML liquid 5 mL  5 mL Oral Q6H PRN Lewanda Rife, MD   5 mL at 10/15/23 1025   latanoprost (XALATAN) 0.005 % ophthalmic solution 1 drop  1 drop Both Eyes QHS Maryagnes Amos, FNP   1 drop at 10/14/23 2126   magnesium hydroxide (MILK OF MAGNESIA) suspension 30 mL  30 mL Oral Daily PRN Maryagnes Amos, FNP   30 mL at 10/12/23 1718   menthol-cetylpyridinium (CEPACOL) lozenge 3 mg  1 lozenge Oral PRN Lewanda Rife, MD   3 mg at 10/15/23 0931   mirtazapine (REMERON) tablet 7.5 mg  7.5 mg Oral QHS Lewanda Rife, MD   7.5 mg at 10/14/23 2129   mometasone-formoterol (DULERA) 200-5 MCG/ACT inhaler 2 puff  2 puff Inhalation BID  Maryagnes Amos, FNP   2 puff at 10/15/23 0928   OLANZapine zydis (ZYPREXA) disintegrating tablet 5 mg  5 mg Oral TID PRN Maryagnes Amos, FNP       pantoprazole (PROTONIX) EC tablet 20 mg  20 mg Oral Daily Maryagnes Amos, FNP   20 mg at 10/15/23 0930   rivaroxaban (XARELTO) tablet 10 mg  10 mg Oral Daily Maryagnes Amos, FNP   10 mg at 10/15/23 4098   senna-docusate (Senokot-S) tablet 1 tablet  1 tablet Oral QHS PRN Maryagnes Amos, FNP   1 tablet at 10/13/23 2107    Lab Results: No results found for this or any previous visit (from the past 48 hours).  Blood Alcohol level:  Lab Results  Component Value Date   ETH <10 10/10/2023   ETH <10 09/15/2023    Metabolic Disorder Labs: Lab Results  Component Value Date   HGBA1C 5.1 09/15/2023   MPG 99.67 09/15/2023   MPG 103 01/13/2023   No results found for: "PROLACTIN" Lab Results  Component Value Date   CHOL 159 09/15/2023   TRIG 53 09/15/2023   HDL 61 09/15/2023   CHOLHDL 2.6 09/15/2023   VLDL 11 09/15/2023   LDLCALC 87 09/15/2023   LDLCALC 99 01/13/2023      Musculoskeletal: Strength & Muscle Tone: within normal limits Gait & Station: normal Patient leans: N/A   Psychiatric Specialty Exam:   Presentation  General Appearance:  Appropriate for Environment   Eye Contact: Fair   Speech: Normal Rate   Speech Volume: Normal   Handedness: Right     Mood and Affect  Mood: Depressed   Affect: Constricted     Thought Process  Thought Processes: Coherent   Descriptions of Associations:Intact   Orientation:Full (Time, Place and Person)   Thought Content: Positive for suicide thoughts   History of Schizophrenia/Schizoaffective  disorder:No   Duration of Psychotic Symptoms:N/A   Hallucinations:Hallucinations: None   Ideas of Reference:None   Suicidal Thoughts: Yes   Homicidal Thoughts: Denies     Sensorium  Memory: Immediate Good; Recent Good; Remote  Good   Judgment: Poor   Insight: Fair     Chartered certified accountant: Fair   Attention Span: Fair   Recall: [employer] of Knowledge: Fair   Language: Fair     Psychomotor Activity  Psychomotor Activity: Psychomotor Activity: Normal     Assets  Assets: Social Support; Desire for Improvement     Sleep  Sleep: Poor       Physical Exam: Physical Exam Constitutional:      Appearance: Normal appearance.  HENT:     Head: Normocephalic and atraumatic.     Nose: Nose normal.  Eyes:     Pupils: Pupils are equal, round, and reactive to light.  Cardiovascular:     Rate and Rhythm: Regular rhythm.  Pulmonary:     Effort: Pulmonary effort is normal.  Skin:    General: Skin is warm.  Neurological:     General: No focal deficit present.     Mental Status: He is alert and oriented to person, place, and time.      Review of Systems  Constitutional:  Negative for chills and fever.  HENT:  Negative for hearing loss and sore throat.   Eyes:  Negative for blurred vision and double vision.  Respiratory:  Negative for cough and shortness of breath.   Cardiovascular:  Negative for chest pain.  Gastrointestinal:  Negative for nausea and vomiting.  Neurological:  Negative for dizziness and speech change.  Psychiatric/Behavioral:  Positive for depression, substance abuse and suicidal ideas.    Blood pressure 125/61, pulse (!) 57, temperature (!) 97.2 F (36.2 C), resp. rate 16, height 6\' 4"  (1.93 m), weight 112.5 kg, SpO2 98%. Body mass index is 30.19 kg/m.   Treatment Plan Summary: Daily contact with patient to assess and evaluate symptoms and progress in treatment and Medication management Patient is admitted to locked unit under safety precautions Patient has been on Abilify in the past, at this time patient is denying any psychotic symptoms will defer use of Abilify for now Increased the dose of Remeron to 15 mg at bedtime for depression and insomnia  10/14/23 Patient was encouraged to abstain from alcohol and drug use Patient was encouraged to attend group and work on coping strategies Social worker consulted to get collateral and help with a safe discharge plan.  Lewanda Rife, MD

## 2023-10-15 NOTE — Progress Notes (Signed)
Patient admitted voluntarily for passive SI/HI towards daughter's boyfriend and worsening depression. Dx: MDD. UDS + cocaine.  Today patient endorses passive SI but agrees to contract for safety. Patient endorses depression. Support and encouragement provided. PRN's of Robitussin and Cepacol lozenge with relief provided on both adm.  Q15 minute unit checks in place.

## 2023-10-15 NOTE — Plan of Care (Signed)
  Problem: Education: Goal: Knowledge of General Education information will improve Description Including pain rating scale, medication(s)/side effects and non-pharmacologic comfort measures Outcome: Progressing   Problem: Health Behavior/Discharge Planning: Goal: Ability to manage health-related needs will improve Outcome: Progressing   

## 2023-10-15 NOTE — Group Note (Signed)
Date:  10/15/2023 Time:  10:51 AM  Group Topic/Focus:  Anger Management  Coping With Mental Health Crisis:   The purpose of this group is to help patients identify strategies for coping with mental health crisis.  Group discusses possible causes of crisis and ways to manage them effectively. Conflict Resolution:   The focus of this group is to discuss the conflict resolution process and how it may be used upon discharge. Goals Group:   The focus of this group is to help patients establish daily goals to achieve during treatment and discuss how the patient can incorporate goal setting into their daily lives to aide in recovery. Healthy Communication:   The focus of this group is to discuss communication, barriers to communication, as well as healthy ways to communicate with others.    Participation Level:  Active  Participation Quality:  Appropriate  Affect:  Appropriate  Cognitive:  Appropriate  Insight: Appropriate  Engagement in Group:  Engaged  Modes of Intervention:  Discussion  Additional Comments:    Kisean Rollo L Nicklous Aburto 10/15/2023, 10:51 AM

## 2023-10-15 NOTE — Plan of Care (Signed)
 Patient took meds

## 2023-10-16 DIAGNOSIS — F1410 Cocaine abuse, uncomplicated: Secondary | ICD-10-CM | POA: Diagnosis not present

## 2023-10-16 DIAGNOSIS — F332 Major depressive disorder, recurrent severe without psychotic features: Secondary | ICD-10-CM | POA: Diagnosis not present

## 2023-10-16 NOTE — Progress Notes (Signed)
Assumed care of patient this am, he presented A&O x 4, affect flat, depressed, mood pleasant and cooperative. Pt out in the milieu, sociable, behavior appropriate to situation and was without complaints. Pt denied thoughts, plan or intent to harm self or others and made a safety commitment, he does continue to endorse A/H, but stated they are much quieter and less harsh.  Pt is compliant with taking medications and following his treatment plan. .   Pt educated on plan of care, medication regimen and he acknowledged an understanding and was without further complaints or concerns. Pt was maintained on q 15 min rounds for safety and support.

## 2023-10-16 NOTE — Plan of Care (Signed)
  Problem: Education: Goal: Knowledge of General Education information will improve Description Including pain rating scale, medication(s)/side effects and non-pharmacologic comfort measures Outcome: Progressing   Problem: Health Behavior/Discharge Planning: Goal: Ability to manage health-related needs will improve Outcome: Progressing   

## 2023-10-16 NOTE — Plan of Care (Signed)

## 2023-10-16 NOTE — Progress Notes (Signed)
[facility] MD Progress Note  10/16/2023  Robert Chan  MRN:  [id number]  Robert Chan is a 67 y.o. male who was initially admitted medically for 10/10/2023  for 2 days of chest pain and week of SI/HI. He carries the psychiatric diagnoses of MDD, severe, recurrent, without psychosis and has a past medical history of  atherosclerotic CAD, COPD, MDD without psychosis, hypertension, hx of cocaine use, history of NSTEMI 06/2020, underwent Left cardiac cath showed 30% prox LAD stenosis and 20% mid RCA stenosis .   Subjective:  Chart reviewed, patient's case discussed in multidisciplinary meeting, patient seen during rounds.  Not much change in mental status.  Patient reports" Fine" mood today.  Patient reports  suicide thoughts are "not bad".  Denies HI. Patient reports he slept fine last night  He denies auditory visual hallucinations.  Patient has been attending groups and working on coping strategies.   Principal Problem: MDD (major depressive disorder), recurrent episode, severe (HCC) Diagnosis: Principal Problem:   MDD (major depressive disorder), recurrent episode, severe (HCC) Active Problems:   Cocaine abuse (HCC)    Past Psychiatric History: Patient has a long history of depression and cocaine use. Patient was admitted to this facility in October. 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   Depression    Elevated serum creatinine 08/28/2019   Homelessness 06/19/2020   Homicidal ideations 05/25/2023   Hypertension    Major depressive disorder, recurrent episode, 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/11/2022   Major depressive disorder, single episode, severe (HCC)  08/10/2022   MDD (major depressive disorder), recurrent severe, 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   NSTEMI (non-ST elevated myocardial infarction) (HCC)    Schizoaffective disorder (HCC) 11/08/2022   Status post incision and drainage 04/22/2021   Suicidal ideation 06/05/2020   Suicidal ideation 06/05/2020    Past Surgical History:  Procedure Laterality Date   APPENDECTOMY     EYE SURGERY     LEFT HEART CATH AND CORONARY ANGIOGRAPHY N/A 06/05/2020   Procedure: LEFT HEART CATH AND CORONARY ANGIOGRAPHY;  Surgeon: Elder Negus, MD;  Location: [facility];  Service: Cardiovascular;  Laterality: N/A;   PROSTATE SURGERY     Family History:  Family History  Problem Relation Age of Onset   Hypertension Mother    Diabetes Mother    Hypertension Father    Hypertension Sister    Family Psychiatric  History: Patient reports history brothers committed suicide by overdose on drugs  Social History:  Social History   Substance and Sexual Activity  Alcohol Use Yes   Comment: 4 40oz beer a week     Social History   Substance and Sexual Activity  Drug Use Yes   Types: Cocaine   Comment: cocaine use once a month, last use 6 mos ago    Social History   Socioeconomic History   Marital status: Single    Spouse name: Not on file   Number of children: Not on file   Years of education: Not on file   Highest education level: Not on file  Occupational History   Not on file  Tobacco Use  Smoking status: Some Days    Current packs/day: 0.00    Types: Cigarettes    Last attempt to quit: 12/25/2020    Years since quitting: 2.8   Smokeless tobacco: Never   Tobacco comments:    Smokes a couple cigarettes every now and then  Vaping Use   Vaping status: Never Used  Substance and Sexual Activity   Alcohol use: Yes    Comment: 4 40oz beer a week   Drug use: Yes    Types: Cocaine    Comment: cocaine use once a month, last use 6 mos ago   Sexual activity: Not Currently  Other  Topics Concern   Not on file  Social History Narrative   His mother passed at age 38 (when he was 69 years old) and his father raised him and his siblings   His parents were pastors as a lot of his sisters and other family members are today   There was a total of 13 children in his family Had 70 sisters, one sister deceased , Had 3 brothers, all brothers deceased   He is the youngest of the 85 and was "always in trouble in my younger years"   Family still live in [place], [place], some in [place], [place]   Has a Daughter and son with a total of 10 grandchildren (6 grand daughters local and 4 grandsons)    Values family   Married twice, present wife incarcerated   Social Drivers of Corporate investment banker Strain: Low Risk  (10/19/2022)   Overall Financial Resource Strain (CARDIA)    Difficulty of Paying Living Expenses: Not hard at all  Food Insecurity: No Food Insecurity (10/11/2023)   Hunger Vital Sign    Worried About Running Out of Food in the Last Year: Never true    Ran Out of Food in the Last Year: Never true  Recent Concern: Food Insecurity - Food Insecurity Present (09/16/2023)   Hunger Vital Sign    Worried About Running Out of Food in the Last Year: Sometimes true    Ran Out of Food in the Last Year: Sometimes true  Transportation Needs: No Transportation Needs (10/11/2023)   PRAPARE - Administrator, Civil Service (Medical): No    Lack of Transportation (Non-Medical): No  Recent Concern: Transportation Needs - Unmet Transportation Needs (10/10/2023)   PRAPARE - Transportation    Lack of Transportation (Medical): Yes    Lack of Transportation (Non-Medical): Yes  Physical Activity: Inactive (10/19/2022)   Exercise Vital Sign    Days of Exercise per Week: 0 days    Minutes of Exercise per Session: 0 min  Stress: No Stress Concern Present (10/19/2022)   [name] of Occupational Health - Occupational Stress Questionnaire    Feeling of Stress : Not  at all  Social Connections: Moderately Integrated (03/31/2022)   Social Connection and Isolation Panel [NHANES]    Frequency of Communication with Friends and Family: Three times a week    Frequency of Social Gatherings with Friends and Family: Three times a week    Attends Religious Services: 1 to 4 times per year    Active Member of Clubs or Organizations: Yes    Attends Banker Meetings: 1 to 4 times per year    Marital Status: Never married                         Sleep: Fair  Appetite:  Fair  Current Medications: Current Facility-Administered Medications  Medication Dose Route Frequency Provider Last Rate Last Admin   acetaminophen (TYLENOL) tablet 650 mg  650 mg Oral Q6H PRN Starkes-Perry, Juel Burrow, FNP       alum & mag hydroxide-simeth (MAALOX/MYLANTA) 200-200-20 MG/5ML suspension 30 mL  30 mL Oral Q4H PRN Starkes-Perry, Juel Burrow, FNP       atorvastatin (LIPITOR) tablet 80 mg  80 mg Oral Daily Maryagnes Amos, FNP   80 mg at 10/16/23 0942   diltiazem (CARDIZEM CD) 24 hr capsule 180 mg  180 mg Oral Daily Lewanda Rife, MD   180 mg at 10/16/23 0943   docusate sodium (COLACE) capsule 100 mg  100 mg Oral BID Lewanda Rife, MD   100 mg at 10/16/23 0943   guaiFENesin (ROBITUSSIN) 100 MG/5ML liquid 5 mL  5 mL Oral Q6H PRN Lewanda Rife, MD   5 mL at 10/16/23 0944   latanoprost (XALATAN) 0.005 % ophthalmic solution 1 drop  1 drop Both Eyes QHS Maryagnes Amos, FNP   1 drop at 10/15/23 2132   magnesium hydroxide (MILK OF MAGNESIA) suspension 30 mL  30 mL Oral Daily PRN Maryagnes Amos, FNP   30 mL at 10/12/23 1718   menthol-cetylpyridinium (CEPACOL) lozenge 3 mg  1 lozenge Oral PRN Lewanda Rife, MD   3 mg at 10/15/23 0931   mirtazapine (REMERON) tablet 15 mg  15 mg Oral QHS Lewanda Rife, MD   15 mg at 10/15/23 2131   mometasone-formoterol (DULERA) 200-5 MCG/ACT inhaler 2 puff  2 puff Inhalation BID Maryagnes Amos, FNP    2 puff at 10/16/23 0945   OLANZapine zydis (ZYPREXA) disintegrating tablet 5 mg  5 mg Oral TID PRN Maryagnes Amos, FNP       pantoprazole (PROTONIX) EC tablet 20 mg  20 mg Oral Daily Maryagnes Amos, FNP   20 mg at 10/16/23 0944   rivaroxaban (XARELTO) tablet 10 mg  10 mg Oral Daily Maryagnes Amos, FNP   10 mg at 10/16/23 2725   senna-docusate (Senokot-S) tablet 1 tablet  1 tablet Oral QHS PRN Maryagnes Amos, FNP   1 tablet at 10/13/23 2107    Lab Results: No results found for this or any previous visit (from the past 48 hours).  Blood Alcohol level:  Lab Results  Component Value Date   ETH <10 10/10/2023   ETH <10 09/15/2023    Metabolic Disorder Labs: Lab Results  Component Value Date   HGBA1C 5.1 09/15/2023   MPG 99.67 09/15/2023   MPG 103 01/13/2023   No results found for: "PROLACTIN" Lab Results  Component Value Date   CHOL 159 09/15/2023   TRIG 53 09/15/2023   HDL 61 09/15/2023   CHOLHDL 2.6 09/15/2023   VLDL 11 09/15/2023   LDLCALC 87 09/15/2023   LDLCALC 99 01/13/2023      Musculoskeletal: Strength & Muscle Tone: within normal limits Gait & Station: normal Patient leans: N/A   Psychiatric Specialty Exam:   Presentation  General Appearance:  Appropriate for Environment   Eye Contact: Fair   Speech: Normal Rate   Speech Volume: Normal   Handedness: Right     Mood and Affect  Mood: Depressed   Affect: Constricted     Thought Process  Thought Processes: Coherent   Descriptions of Associations:Intact   Orientation:Full (Time, Place and Person)   Thought Content: Positive for suicide thoughts   History of Schizophrenia/Schizoaffective disorder:No   Duration  of Psychotic Symptoms:N/A   Hallucinations:Hallucinations: None   Ideas of Reference:None   Suicidal Thoughts: "decreased"   Homicidal Thoughts: Denies     Sensorium  Memory: Immediate Good; Recent Good; Remote Good   Judgment: Poor    Insight: Fair     Chartered certified accountant: Fair   Attention Span: Fair   Recall: [employer] of Knowledge: Fair   Language: Fair     Psychomotor Activity  Psychomotor Activity: Psychomotor Activity: Normal     Assets  Assets: Social Support; Desire for Improvement     Sleep  Sleep: Poor       Physical Exam: Physical Exam Constitutional:      Appearance: Normal appearance.  HENT:     Head: Normocephalic and atraumatic.     Nose: Nose normal.  Eyes:     Pupils: Pupils are equal, round, and reactive to light.  Cardiovascular:     Rate and Rhythm: Regular rhythm.  Pulmonary:     Effort: Pulmonary effort is normal.  Skin:    General: Skin is warm.  Neurological:     General: No focal deficit present.     Mental Status: He is alert and oriented to person, place, and time.      Review of Systems  Constitutional:  Negative for chills and fever.  HENT:  Negative for hearing loss and sore throat.   Eyes:  Negative for blurred vision and double vision.  Respiratory:  Negative for cough and shortness of breath.   Cardiovascular:  Negative for chest pain.  Gastrointestinal:  Negative for nausea and vomiting.  Neurological:  Negative for dizziness and speech change.  Psychiatric/Behavioral:  Positive for depression, substance abuse and suicidal ideas.    Blood pressure 130/67, pulse 70, temperature 98.6 F (37 C), resp. rate 14, height 6\' 4"  (1.93 m), weight 112.5 kg, SpO2 100%. Body mass index is 30.19 kg/m.   Treatment Plan Summary: Daily contact with patient to assess and evaluate symptoms and progress in treatment and Medication management Patient is admitted to locked unit under safety precautions Patient has been on Abilify in the past, at this time patient is denying any psychotic symptoms will defer use of Abilify for now Increased the dose of Remeron to 15 mg at bedtime for depression and insomnia 10/14/23 Patient was encouraged to  abstain from alcohol and drug use Patient was encouraged to attend group and work on coping strategies Social worker consulted to get collateral and help with a safe discharge plan.  Lewanda Rife, MD

## 2023-10-16 NOTE — Progress Notes (Signed)
D: Pt alert and oriented. Pt denies anxiety/depression. Denies experiencing any pain at this time. Denies experiencing any SI/HI, or AVH at this time.    A: Scheduled medications administered to pt, per MD orders. Support and encouragement provided. Frequent verbal contact made. Routine safety checks conducted q15 minutes.   R: No adverse drug reactions noted. Pt verbally contracts for safety at this time. Pt complaint with medications. Pt interacts minimally with others on the unit. Pt remains safe at this time. Will continue plan of care.

## 2023-10-16 NOTE — Group Note (Signed)
Date:  10/16/2023 Time:  10:22 PM  Group Topic/Focus:  Building Self Esteem:   The Focus of this group is helping patients become aware of the effects of self-esteem on their lives, the things they and others do that enhance or undermine their self-esteem, seeing the relationship between their level of self-esteem and the choices they make and learning ways to enhance self-esteem. Making Healthy Choices:   The focus of this group is to help patients identify negative/unhealthy choices they were using prior to admission and identify positive/healthier coping strategies to replace them upon discharge. Self Care:   The focus of this group is to help patients understand the importance of self-care in order to improve or restore emotional, physical, spiritual, interpersonal, and financial health.    Participation Level:  Active  Participation Quality:  Appropriate  Affect:  Appropriate  Cognitive:  Appropriate  Insight: Appropriate  Engagement in Group:  Engaged  Modes of Intervention:  Discussion  Additional Comments:    [name] 10/16/2023, 10:22 PM

## 2023-10-16 NOTE — Group Note (Signed)
LCSW Group Therapy Note  Group Date: 10/16/2023 Start Time: 1005 End Time: 1045   Type of Therapy and Topic:  Group Therapy: Using "I" Statements  Participation Level:  Active  Description of Group:  Patients were asked to provide details of some interpersonal conflicts they have experienced. Patients were then educated about "I" statements, communication which focuses on feelings or views of the speaker rather than what the other person is doing. T group members were asked to reflect on past conflicts and to provide specific examples for utilizing "I" statements.  Therapeutic Goals:  Patients will verbalize understanding of ineffective communication and effective communication. Patients will be able to empathize with whom they are having conflict. Patients will practice effective communication in the form of "I" statements.    Summary of Patient Progress:  The patient was present/active throughout the session and proved open to feedback from CSW and peers. Patient demonstrated positive insight into the subject matter, was respectful of peers, and was present throughout the entire session.  Therapeutic Modalities:   Cognitive Behavioral Therapy Solution-Focused Therapy    Whitney Post, LCSW 10/16/2023  11:08 AM

## 2023-10-17 DIAGNOSIS — F1410 Cocaine abuse, uncomplicated: Secondary | ICD-10-CM | POA: Diagnosis not present

## 2023-10-17 DIAGNOSIS — F332 Major depressive disorder, recurrent severe without psychotic features: Secondary | ICD-10-CM | POA: Diagnosis not present

## 2023-10-17 MED ORDER — MELATONIN 5 MG PO TABS
5.0000 mg | ORAL_TABLET | Freq: Every day | ORAL | Status: DC
  Administered 2023-10-17 – 2023-10-31 (×15): 5 mg via ORAL
  Filled 2023-10-17 (×15): qty 1

## 2023-10-17 MED ORDER — MIRTAZAPINE 15 MG PO TABS
30.0000 mg | ORAL_TABLET | Freq: Every day | ORAL | Status: DC
  Administered 2023-10-17 – 2023-10-31 (×15): 30 mg via ORAL
  Filled 2023-10-17 (×15): qty 2

## 2023-10-17 NOTE — Plan of Care (Signed)
  Problem: Health Behavior/Discharge Planning: Goal: Ability to manage health-related needs will improve Outcome: Progressing   Problem: Nutrition: Goal: Adequate nutrition will be maintained Outcome: Progressing   Problem: Coping: Goal: Level of anxiety will decrease Outcome: Progressing   

## 2023-10-17 NOTE — Progress Notes (Signed)
[facility] MD Progress Note  10/17/2023  Robert Chan  MRN:  [id number]  Robert Chan is a 67 y.o. male who was initially admitted medically for 10/10/2023  for 2 days of chest pain and week of SI/HI. He carries the psychiatric diagnoses of MDD, severe, recurrent, without psychosis and has a past medical history of  atherosclerotic CAD, COPD, MDD without psychosis, hypertension, hx of cocaine use, history of NSTEMI 06/2020, underwent Left cardiac cath showed 30% prox LAD stenosis and 20% mid RCA stenosis .   Subjective:  Chart reviewed, patient's case discussed in multidisciplinary meeting, patient seen during rounds.  Not much change in mental status.  Patient reports" depressed" mood today.  He reports he feels worse as compared to yesterday.  Patient reports suicidal thoughts, denies any intention to harm himself on the unit.  When asked about reason for depression and suicide thoughts, patient reports he feels lonely.  He reports holidays are hard to deal with.  He denies homicidal ideations.. Patient reports he slept well up and down night.  Patient agrees to try melatonin for sleep.  He denies auditory or  visual hallucinations.  Patient has been attending groups and working on coping strategies.   Principal Problem: MDD (major depressive disorder), recurrent episode, severe (HCC) Diagnosis: Principal Problem:   MDD (major depressive disorder), recurrent episode, severe (HCC) Active Problems:   Cocaine abuse (HCC)    Past Psychiatric History: Patient has a long history of depression and cocaine use. Patient was admitted to this facility in October. He has been noncompliant with treatment   Past Medical History:  Past Medical History:  Diagnosis Date   Acute bilateral low back pain without sciatica 02/27/2021   Asthma    CAD (coronary artery disease)    Cataract    CKD (chronic kidney disease)    Cocaine use disorder, mild, abuse (HCC)    COPD (chronic obstructive pulmonary disease)  (HCC)    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   Depression    Elevated serum creatinine 08/28/2019   Homelessness 06/19/2020   Homicidal ideations 05/25/2023   Hypertension    Major depressive disorder, recurrent episode, 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/11/2022   Major depressive disorder, single episode, 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/10/2022   MDD (major depressive disorder), recurrent severe, 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   NSTEMI (non-ST elevated myocardial infarction) (HCC)    Schizoaffective disorder (HCC) 11/08/2022   Status post incision and drainage 04/22/2021   Suicidal ideation 06/05/2020   Suicidal ideation 06/05/2020    Past Surgical History:  Procedure Laterality Date   APPENDECTOMY     EYE SURGERY     LEFT HEART CATH AND CORONARY ANGIOGRAPHY N/A 06/05/2020   Procedure: LEFT HEART CATH AND CORONARY ANGIOGRAPHY;  Surgeon: Elder Negus, MD;  Location: [facility];  Service: Cardiovascular;  Laterality: N/A;   PROSTATE SURGERY     Family History:  Family History  Problem Relation Age of Onset   Hypertension Mother    Diabetes Mother    Hypertension Father    Hypertension Sister    Family Psychiatric  History: Patient reports history brothers committed suicide by overdose on drugs  Social History:  Social History   Substance and Sexual Activity  Alcohol Use Yes   Comment: 4 40oz beer a week     Social History   Substance and Sexual Activity  Drug Use Yes   Types: Cocaine   Comment: cocaine use once a month, last use 6 mos ago    Social History  Socioeconomic History   Marital status: Single    Spouse name: Not on file   Number of children: Not on file   Years of education: Not on file   Highest education level: Not on file  Occupational History   Not on file  Tobacco Use   Smoking status: Some Days    Current packs/day: 0.00    Types: Cigarettes    Last attempt to quit: 12/25/2020    Years since quitting: 2.8   Smokeless tobacco: Never   Tobacco comments:     Smokes a couple cigarettes every now and then  Vaping Use   Vaping status: Never Used  Substance and Sexual Activity   Alcohol use: Yes    Comment: 4 40oz beer a week   Drug use: Yes    Types: Cocaine    Comment: cocaine use once a month, last use 6 mos ago   Sexual activity: Not Currently  Other Topics Concern   Not on file  Social History Narrative   His mother passed at age 68 (when he was 33 years old) and his father raised him and his siblings   His parents were pastors as a lot of his sisters and other family members are today   There was a total of 13 children in his family Had 75 sisters, one sister deceased , Had 3 brothers, all brothers deceased   He is the youngest of the 7 and was "always in trouble in my younger years"   Family still live in [place], [place], some in [place], [place]   Has a Daughter and son with a total of 10 grandchildren (6 grand daughters local and 4 grandsons)    Values family   Married twice, present wife incarcerated   Social Drivers of Corporate investment banker Strain: Low Risk  (10/19/2022)   Overall Financial Resource Strain (CARDIA)    Difficulty of Paying Living Expenses: Not hard at all  Food Insecurity: No Food Insecurity (10/11/2023)   Hunger Vital Sign    Worried About Running Out of Food in the Last Year: Never true    Ran Out of Food in the Last Year: Never true  Recent Concern: Food Insecurity - Food Insecurity Present (09/16/2023)   Hunger Vital Sign    Worried About Running Out of Food in the Last Year: Sometimes true    Ran Out of Food in the Last Year: Sometimes true  Transportation Needs: No Transportation Needs (10/11/2023)   PRAPARE - Administrator, Civil Service (Medical): No    Lack of Transportation (Non-Medical): No  Recent Concern: Transportation Needs - Unmet Transportation Needs (10/10/2023)   PRAPARE - Transportation    Lack of Transportation (Medical): Yes    Lack of Transportation  (Non-Medical): Yes  Physical Activity: Inactive (10/19/2022)   Exercise Vital Sign    Days of Exercise per Week: 0 days    Minutes of Exercise per Session: 0 min  Stress: No Stress Concern Present (10/19/2022)   [name] of Occupational Health - Occupational Stress Questionnaire    Feeling of Stress : Not at all  Social Connections: Moderately Integrated (03/31/2022)   Social Connection and Isolation Panel [NHANES]    Frequency of Communication with Friends and Family: Three times a week    Frequency of Social Gatherings with Friends and Family: Three times a week    Attends Religious Services: 1 to 4 times per year    Active Member of Clubs  or Organizations: Yes    Attends Banker Meetings: 1 to 4 times per year    Marital Status: Never married                         Sleep: Up-and-down  Appetite:  Fair  Current Medications: Current Facility-Administered Medications  Medication Dose Route Frequency Provider Last Rate Last Admin   acetaminophen (TYLENOL) tablet 650 mg  650 mg Oral Q6H PRN Starkes-Perry, Juel Burrow, FNP       alum & mag hydroxide-simeth (MAALOX/MYLANTA) 200-200-20 MG/5ML suspension 30 mL  30 mL Oral Q4H PRN Starkes-Perry, Juel Burrow, FNP       atorvastatin (LIPITOR) tablet 80 mg  80 mg Oral Daily Maryagnes Amos, FNP   80 mg at 10/17/23 1104   diltiazem (CARDIZEM CD) 24 hr capsule 180 mg  180 mg Oral Daily Lewanda Rife, MD   180 mg at 10/17/23 1105   docusate sodium (COLACE) capsule 100 mg  100 mg Oral BID Lewanda Rife, MD   100 mg at 10/17/23 1105   guaiFENesin (ROBITUSSIN) 100 MG/5ML liquid 5 mL  5 mL Oral Q6H PRN Lewanda Rife, MD   5 mL at 10/17/23 1127   latanoprost (XALATAN) 0.005 % ophthalmic solution 1 drop  1 drop Both Eyes QHS Maryagnes Amos, FNP   1 drop at 10/16/23 2142   magnesium hydroxide (MILK OF MAGNESIA) suspension 30 mL  30 mL Oral Daily PRN Maryagnes Amos, FNP   30 mL at 10/12/23 1718    menthol-cetylpyridinium (CEPACOL) lozenge 3 mg  1 lozenge Oral PRN Lewanda Rife, MD   3 mg at 10/17/23 1203   mirtazapine (REMERON) tablet 15 mg  15 mg Oral QHS Lewanda Rife, MD   15 mg at 10/16/23 2142   mometasone-formoterol (DULERA) 200-5 MCG/ACT inhaler 2 puff  2 puff Inhalation BID Maryagnes Amos, FNP   2 puff at 10/17/23 1105   OLANZapine zydis (ZYPREXA) disintegrating tablet 5 mg  5 mg Oral TID PRN Maryagnes Amos, FNP       pantoprazole (PROTONIX) EC tablet 20 mg  20 mg Oral Daily Maryagnes Amos, FNP   20 mg at 10/17/23 1105   rivaroxaban (XARELTO) tablet 10 mg  10 mg Oral Daily Maryagnes Amos, FNP   10 mg at 10/17/23 1105   senna-docusate (Senokot-S) tablet 1 tablet  1 tablet Oral QHS PRN Maryagnes Amos, FNP   1 tablet at 10/13/23 2107    Lab Results: No results found for this or any previous visit (from the past 48 hours).  Blood Alcohol level:  Lab Results  Component Value Date   ETH <10 10/10/2023   ETH <10 09/15/2023    Metabolic Disorder Labs: Lab Results  Component Value Date   HGBA1C 5.1 09/15/2023   MPG 99.67 09/15/2023   MPG 103 01/13/2023   No results found for: "PROLACTIN" Lab Results  Component Value Date   CHOL 159 09/15/2023   TRIG 53 09/15/2023   HDL 61 09/15/2023   CHOLHDL 2.6 09/15/2023   VLDL 11 09/15/2023   LDLCALC 87 09/15/2023   LDLCALC 99 01/13/2023      Musculoskeletal: Strength & Muscle Tone: within normal limits Gait & Station: normal Patient leans: N/A   Psychiatric Specialty Exam:   Presentation  General Appearance:  Appropriate for Environment   Eye Contact: Fair   Speech: Normal Rate   Speech Volume: Normal   Handedness: Right  Mood and Affect  Mood: Depressed   Affect: Constricted     Thought Process  Thought Processes: Coherent   Descriptions of Associations:Intact   Orientation:Full (Time, Place and Person)   Thought Content: Positive for suicide  thoughts   History of Schizophrenia/Schizoaffective disorder:No   Duration of Psychotic Symptoms:N/A   Hallucinations:Hallucinations: None   Ideas of Reference:None   Suicidal Thoughts: Patient reports suicidal thoughts are worse than yesterday  Homicidal Thoughts: Denies     Sensorium  Memory: Immediate Good; Recent Good; Remote Good   Judgment: Poor   Insight: Fair     Chartered certified accountant: Fair   Attention Span: Fair   Recall: [employer] of Knowledge: Fair   Language: Fair     Psychomotor Activity  Psychomotor Activity: Psychomotor Activity: Normal     Assets  Assets: Social Support; Desire for Improvement     Sleep  Sleep: Poor       Physical Exam: Physical Exam Constitutional:      Appearance: Normal appearance.  HENT:     Head: Normocephalic and atraumatic.     Nose: Nose normal.  Eyes:     Pupils: Pupils are equal, round, and reactive to light.  Cardiovascular:     Rate and Rhythm: Regular rhythm.  Pulmonary:     Effort: Pulmonary effort is normal.  Skin:    General: Skin is warm.  Neurological:     General: No focal deficit present.     Mental Status: He is alert and oriented to person, place, and time.      Review of Systems  Constitutional:  Negative for chills and fever.  HENT:  Negative for hearing loss and sore throat.   Eyes:  Negative for blurred vision and double vision.  Respiratory:  Negative for cough and shortness of breath.   Cardiovascular:  Negative for chest pain.  Gastrointestinal:  Negative for nausea and vomiting.  Neurological:  Negative for dizziness and speech change.  Psychiatric/Behavioral:  Positive for depression, substance abuse and suicidal ideas.    Blood pressure 124/76, pulse 70, temperature 97.9 F (36.6 C), resp. rate 14, height 6\' 4"  (1.93 m), weight 112.5 kg, SpO2 100%. Body mass index is 30.19 kg/m.   Treatment Plan Summary: Daily contact with patient to assess and  evaluate symptoms and progress in treatment and Medication management Patient is admitted to locked unit under safety precautions Patient has been on Abilify in the past, at this time patient is denying any psychotic symptoms will defer use of Abilify for now Increase the dose of Remeron to 30 mg at bedtime for depression and insomnia 10/17/23 Patient was encouraged to abstain from alcohol and drug use Patient was encouraged to attend group and work on coping strategies Social worker consulted to get collateral and help with a safe discharge plan. Will start on melatonin 5 mg at bedtime for insomnia 10/17/2023  Lewanda Rife, MD

## 2023-10-17 NOTE — Progress Notes (Signed)
Cooperative with care, he denies SI, HI & AVH. He seemed to sleep well through out the night and he was visible in the milieu during the evening.Marland Kitchen He was compliant with medications, no new issues to report on shift at this time.

## 2023-10-17 NOTE — Progress Notes (Signed)
 Patient pleasant and cooperative.  Endorses anxiety and depression.  Endorses SI, but "not here."  Contracts for safety on the unit.  Denies HI and AVH.  Denies pain. Reports he slept well.   Compliant with scheduled medications.  PRN medications given for cold symptoms. 15 min checks in place for safety.  Patient is present in the milieu.  Appropriate interaction with peers and staff.

## 2023-10-17 NOTE — Plan of Care (Signed)
  Problem: Education: Goal: Knowledge of General Education information will improve Description: Including pain rating scale, medication(s)/side effects and non-pharmacologic comfort measures Outcome: Progressing   Problem: Health Behavior/Discharge Planning: Goal: Ability to manage health-related needs will improve Outcome: Progressing   Problem: Clinical Measurements: Goal: Ability to maintain clinical measurements within normal limits will improve Outcome: Progressing Goal: Will remain free from infection Outcome: Progressing Goal: Diagnostic test results will improve Outcome: Progressing Goal: Respiratory complications will improve Outcome: Progressing Goal: Cardiovascular complication will be avoided Outcome: Progressing   Problem: Activity: Goal: Risk for activity intolerance will decrease Outcome: Progressing   Problem: Nutrition: Goal: Adequate nutrition will be maintained Outcome: Progressing   Problem: Coping: Goal: Level of anxiety will decrease Outcome: Progressing   Problem: Elimination: Goal: Will not experience complications related to bowel motility Outcome: Progressing Goal: Will not experience complications related to urinary retention Outcome: Progressing   Problem: Pain Management: Goal: General experience of comfort will improve Outcome: Progressing   Problem: Safety: Goal: Ability to remain free from injury will improve Outcome: Progressing   Problem: Skin Integrity: Goal: Risk for impaired skin integrity will decrease Outcome: Progressing   Problem: Education: Goal: Utilization of techniques to improve thought processes will improve Outcome: Progressing

## 2023-10-17 NOTE — Group Note (Signed)
Recreation Therapy Group Note   Group Topic:Emotion Expression  Group Date: 10/17/2023 Start Time: 1400 End Time: 1450 Facilitators: Rosina Lowenstein, LRT, CTRS Location:  Dayroom  Group Description: Picture a Diplomatic Services operational officer. Patients and LRT discuss what it means to be "at peace", what it feels like physically and mentally. Pts are given a canvas and oil pastels to use and encouraged to draw their idea of a peaceful place. Pts and LRT discuss how they use this in their daily life post discharge. Pts are encouraged to take their canvas home with them as a reminder of their peaceful place whenever they are feeling depressed, anxious, etc.    Goal Area(s) Addressed:  Patient will identify what it means to experience a "peaceful" emotion. Patient will identify a new coping skill.  Patient will express their emotions through art. Patients will increase communication by talking with LRT and peers while in group.   Affect/Mood: Appropriate and Flat   Participation Level: Active   Participation Quality: Independent   Behavior: Cooperative   Speech/Thought Process: Coherent   Insight: Fair   Judgement: Good   Modes of Intervention: Art, Education, and Guided Discussion   Patient Response to Interventions:  Attentive and Receptive   Education Outcome:  Acknowledges education   Clinical Observations/Individualized Feedback: Robert Chan was active in their participation of session activities and group discussion. Pt identified "my peaceful place is my house" and drew a picture of his house. Pt used markers to draw. Pt minimally interacted with LRT and peers while in group.    Plan: Continue to engage patient in RT group sessions 2-3x/week.   Rosina Lowenstein, LRT, CTRS 10/17/2023 4:31 PM

## 2023-10-17 NOTE — Plan of Care (Signed)
  Problem: Activity: Goal: Risk for activity intolerance will decrease Outcome: Progressing   Problem: Nutrition: Goal: Adequate nutrition will be maintained Outcome: Progressing   Problem: Coping: Goal: Level of anxiety will decrease Outcome: Progressing  Patient compliant with medications denies SI/HI/A/VH and verbally contracts for safety.  Q 15 minutes safety checks ongoing. Patient remains safe.

## 2023-10-17 NOTE — BH IP Treatment Plan (Signed)
Interdisciplinary Treatment and Diagnostic Plan Update  10/17/2023 Time of Session: 9:30 AM  Kap Miltimore MRN: [id number]  Principal Diagnosis: MDD (major depressive disorder), recurrent episode, severe (HCC)  Secondary Diagnoses: Principal Problem:   MDD (major depressive disorder), recurrent episode, severe (HCC) Active Problems:   Cocaine abuse (HCC)   Current Medications:  Current Facility-Administered Medications  Medication Dose Route Frequency Provider Last Rate Last Admin   acetaminophen (TYLENOL) tablet 650 mg  650 mg Oral Q6H PRN Starkes-Perry, Juel Burrow, FNP       alum & mag hydroxide-simeth (MAALOX/MYLANTA) 200-200-20 MG/5ML suspension 30 mL  30 mL Oral Q4H PRN Starkes-Perry, Juel Burrow, FNP       atorvastatin (LIPITOR) tablet 80 mg  80 mg Oral Daily Maryagnes Amos, FNP   80 mg at 10/17/23 1104   diltiazem (CARDIZEM CD) 24 hr capsule 180 mg  180 mg Oral Daily Lewanda Rife, MD   180 mg at 10/17/23 1105   docusate sodium (COLACE) capsule 100 mg  100 mg Oral BID Lewanda Rife, MD   100 mg at 10/17/23 1105   guaiFENesin (ROBITUSSIN) 100 MG/5ML liquid 5 mL  5 mL Oral Q6H PRN Lewanda Rife, MD   5 mL at 10/17/23 1127   latanoprost (XALATAN) 0.005 % ophthalmic solution 1 drop  1 drop Both Eyes QHS Maryagnes Amos, FNP   1 drop at 10/16/23 2142   magnesium hydroxide (MILK OF MAGNESIA) suspension 30 mL  30 mL Oral Daily PRN Maryagnes Amos, FNP   30 mL at 10/12/23 1718   menthol-cetylpyridinium (CEPACOL) lozenge 3 mg  1 lozenge Oral PRN Lewanda Rife, MD   3 mg at 10/17/23 1203   mirtazapine (REMERON) tablet 15 mg  15 mg Oral QHS Lewanda Rife, MD   15 mg at 10/16/23 2142   mometasone-formoterol (DULERA) 200-5 MCG/ACT inhaler 2 puff  2 puff Inhalation BID Maryagnes Amos, FNP   2 puff at 10/17/23 1105   OLANZapine zydis (ZYPREXA) disintegrating tablet 5 mg  5 mg Oral TID PRN Maryagnes Amos, FNP       pantoprazole (PROTONIX) EC  tablet 20 mg  20 mg Oral Daily Maryagnes Amos, FNP   20 mg at 10/17/23 1105   rivaroxaban (XARELTO) tablet 10 mg  10 mg Oral Daily Maryagnes Amos, FNP   10 mg at 10/17/23 1105   senna-docusate (Senokot-S) tablet 1 tablet  1 tablet Oral QHS PRN Maryagnes Amos, FNP   1 tablet at 10/13/23 2107   PTA Medications: Medications Prior to Admission  Medication Sig Dispense Refill Last Dose/Taking   albuterol (VENTOLIN HFA) 108 (90 Base) MCG/ACT inhaler Inhale 2 puffs into the lungs every 6 (six) hours as needed for shortness of breath. 18 g 0    ARIPiprazole (ABILIFY) 5 MG tablet Take 1 tablet (5 mg total) by mouth daily. (Patient not taking: Reported on 10/10/2023) 30 tablet 0    atorvastatin (LIPITOR) 80 MG tablet Take 1 tablet (80 mg total) by mouth daily. (Patient not taking: Reported on 10/10/2023) 30 tablet 0    diltiazem (CARDIZEM CD) 180 MG 24 hr capsule Take 1 capsule (180 mg total) by mouth daily. (Patient not taking: Reported on 10/10/2023) 30 capsule 0    latanoprost (XALATAN) 0.005 % ophthalmic solution Place 1 drop into both eyes at bedtime. (Patient not taking: Reported on 10/10/2023) 2.5 mL 12    lidocaine (LIDODERM) 5 % Place 1 patch onto the skin daily. Remove & Discard patch within  12 hours or as directed by MD      mometasone-formoterol (DULERA) 200-5 MCG/ACT AERO Inhale 2 puffs into the lungs 2 (two) times daily. (Patient not taking: Reported on 10/10/2023) 1 each 1    traZODone (DESYREL) 50 MG tablet Take 1 tablet (50 mg total) by mouth at bedtime as needed for sleep. (Patient not taking: Reported on 10/10/2023) 30 tablet 0     Patient Stressors: Marital or family conflict   Medication change or noncompliance    Patient Strengths: Ability for insight  Forensic psychologist fund of knowledge  Motivation for treatment/growth   Treatment Modalities: Medication Management, Group therapy, Case management,  1 to 1 session with clinician, Psychoeducation,  Recreational therapy.   Physician Treatment Plan for Primary Diagnosis: MDD (major depressive disorder), recurrent episode, severe (HCC) Long Term Goal(s): Improvement in symptoms so as ready for discharge   Short Term Goals: Ability to identify changes in lifestyle to reduce recurrence of condition will improve Ability to verbalize feelings will improve Ability to disclose and discuss suicidal ideas Ability to demonstrate self-control will improve Ability to identify and develop effective coping behaviors will improve Ability to maintain clinical measurements within normal limits will improve Compliance with prescribed medications will improve Ability to identify triggers associated with substance abuse/mental health issues will improve  Medication Management: Evaluate patient's response, side effects, and tolerance of medication regimen.  Therapeutic Interventions: 1 to 1 sessions, Unit Group sessions and Medication administration.  Evaluation of Outcomes: Progressing  Physician Treatment Plan for Secondary Diagnosis: Principal Problem:   MDD (major depressive disorder), recurrent episode, severe (HCC) Active Problems:   Cocaine abuse (HCC)  Long Term Goal(s): Improvement in symptoms so as ready for discharge   Short Term Goals: Ability to identify changes in lifestyle to reduce recurrence of condition will improve Ability to verbalize feelings will improve Ability to disclose and discuss suicidal ideas Ability to demonstrate self-control will improve Ability to identify and develop effective coping behaviors will improve Ability to maintain clinical measurements within normal limits will improve Compliance with prescribed medications will improve Ability to identify triggers associated with substance abuse/mental health issues will improve     Medication Management: Evaluate patient's response, side effects, and tolerance of medication regimen.  Therapeutic Interventions: 1  to 1 sessions, Unit Group sessions and Medication administration.  Evaluation of Outcomes: Progressing   RN Treatment Plan for Primary Diagnosis: MDD (major depressive disorder), recurrent episode, severe (HCC) Long Term Goal(s): Knowledge of disease and therapeutic regimen to maintain health will improve  Short Term Goals: Ability to remain free from injury will improve, Ability to verbalize frustration and anger appropriately will improve, Ability to demonstrate self-control, Ability to participate in decision making will improve, Ability to verbalize feelings will improve, Ability to disclose and discuss suicidal ideas, Ability to identify and develop effective coping behaviors will improve, and Compliance with prescribed medications will improve  Medication Management: RN will administer medications as ordered by provider, will assess and evaluate patient's response and provide education to patient for prescribed medication. RN will report any adverse and/or side effects to prescribing provider.  Therapeutic Interventions: 1 on 1 counseling sessions, Psychoeducation, Medication administration, Evaluate responses to treatment, Monitor vital signs and CBGs as ordered, Perform/monitor CIWA, COWS, AIMS and Fall Risk screenings as ordered, Perform wound care treatments as ordered.  Evaluation of Outcomes: Progressing   LCSW Treatment Plan for Primary Diagnosis: MDD (major depressive disorder), recurrent episode, severe (HCC) Long Term Goal(s): Safe transition to appropriate  next level of care at discharge, Engage patient in therapeutic group addressing interpersonal concerns.  Short Term Goals: Engage patient in aftercare planning with referrals and resources, Increase social support, Increase ability to appropriately verbalize feelings, Increase emotional regulation, Facilitate acceptance of mental health diagnosis and concerns, Facilitate patient progression through stages of change regarding  substance use diagnoses and concerns, Identify triggers associated with mental health/substance abuse issues, and Increase skills for wellness and recovery  Therapeutic Interventions: Assess for all discharge needs, 1 to 1 time with Social worker, Explore available resources and support systems, Assess for adequacy in community support network, Educate family and significant other(s) on suicide prevention, Complete Psychosocial Assessment, Interpersonal group therapy.  Evaluation of Outcomes: Progressing   Progress in Treatment: Attending groups: Yes. and No. Participating in groups: Yes. and No. Taking medication as prescribed: Yes. Toleration medication: Yes. Family/Significant other contact made: No, will contact:  CSW will contact if given permission  Patient understands diagnosis: Yes. Discussing patient identified problems/goals with staff: Yes. Medical problems stabilized or resolved: Yes. Denies suicidal/homicidal ideation: Yes. Issues/concerns per patient self-inventory: No. Other: None   New problem(s) identified: No, Describe:  none Update 10/17/23: No changes at this time    New Short Term/Long Term Goal(s): detox, elimination of symptoms of psychosis, medication management for mood stabilization; elimination of SI thoughts; development of comprehensive mental wellness/sobriety plan.  Update 10/17/23: No changes at this time    Patient Goals:  "not come back" Update 10/17/23: No changes at this time    Discharge Plan or Barriers: Patient reports plans to return to his home at discharge. He reports plans to begin aftercare treatment once discharged.  Update 10/17/23: No changes at this time    Reason for Continuation of Hospitalization: Anxiety Depression Homicidal ideation Medication stabilization Suicidal ideation   Estimated Length of Stay:  1-7 days Update 10/17/23: TBD   Last 3 [place] Suicide Severity Risk Score: Flowsheet Row Admission (Current) from 10/11/2023  in [facility] to Hosp-Admission (Discharged) from 10/10/2023 in [facility] ED from 09/16/2023 in [facility]  C-SSRS RISK CATEGORY No Risk High Risk No Risk       Last PHQ 2/9 Scores:    09/21/2023    2:39 PM 09/21/2023    6:56 AM 09/16/2023    2:19 PM  Depression screen PHQ 2/9  Decreased Interest 1 0 0  Down, Depressed, Hopeless 0 0 1  PHQ - 2 Score 1 0 1    Scribe for Treatment Team: Elza Rafter, Theresia Majors 10/17/2023 1:38 PM

## 2023-10-18 DIAGNOSIS — F332 Major depressive disorder, recurrent severe without psychotic features: Secondary | ICD-10-CM | POA: Diagnosis not present

## 2023-10-18 MED ORDER — ARIPIPRAZOLE 5 MG PO TABS
5.0000 mg | ORAL_TABLET | Freq: Every day | ORAL | Status: DC
  Administered 2023-10-18 – 2023-10-19 (×2): 5 mg via ORAL
  Filled 2023-10-18 (×2): qty 1

## 2023-10-18 NOTE — Progress Notes (Signed)
 Patient cooperative and pleasant with staff.  Endorses anxiety and depression.  Endorses SI, but "not here."  Contracts for safety on the unit.  Denies HI and AVH.  Denies pain.  Reports he slept well.    Compliant with scheduled medications.  15 min checks in place for safety.  Patient is present in the milieu.  Appropriate interaction with peers and staff.

## 2023-10-18 NOTE — Plan of Care (Signed)
  Problem: Education: Goal: Knowledge of General Education information will improve Description: Including pain rating scale, medication(s)/side effects and non-pharmacologic comfort measures Outcome: Progressing   Problem: Health Behavior/Discharge Planning: Goal: Ability to manage health-related needs will improve Outcome: Progressing   Problem: Clinical Measurements: Goal: Ability to maintain clinical measurements within normal limits will improve Outcome: Progressing  Patient denies SI/HI/A/VH and verbally contracts for safety. Interacting well with Peers and Staff compliant with treatment plan. Compliant with medications no adverse effects noted. Support and encouragement provided.

## 2023-10-18 NOTE — Group Note (Signed)
Recreation Therapy Group Note  Group Topic:General Recreation  Group Date: 10/18/2023 Start Time: 1400 End Time: 1500 Facilitators: Rosina Lowenstein, LRT, CTRS Location: Courtyard  Group Description: Outdoor Recreation. Patients had the option to play corn hole, ring toss, bowling or listening to music while outside in the courtyard getting fresh air and sunlight. Marland Kitchen LRT and patients discussed things that they enjoy doing in their free time outside of the hospital. LRT encouraged patients to drink water after being active and getting their heart rate up.   Goal Area(s) Addressed: Patient will identify leisure interests.  Patient will practice healthy decision making. Patient will engage in recreation activity.  Affect/Mood: N/A   Participation Level: Did not attend    Clinical Observations/Individualized Feedback: Robert Chan did not attend group.   Plan: Continue to engage patient in RT group sessions 2-3x/week.   Rosina Lowenstein, LRT, CTRS 10/18/2023 4:56 PM

## 2023-10-18 NOTE — Plan of Care (Signed)
  Problem: Education: Goal: Knowledge of General Education information will improve Description: Including pain rating scale, medication(s)/side effects and non-pharmacologic comfort measures Outcome: Progressing   Problem: Nutrition: Goal: Adequate nutrition will be maintained Outcome: Progressing   Problem: Coping: Goal: Level of anxiety will decrease Outcome: Progressing   

## 2023-10-18 NOTE — Progress Notes (Signed)
[facility] MD Progress Note  10/18/2023 12:22 PM Robert Chan  MRN:  [id number] Subjective: Robert Chan is seen on rounds.  He has no complaints.  He was originally transferred from medicine after having a stent placed.  His recent history as as follows: Robert Chan is a 67 y.o. male who was initially admitted medically for 10/10/2023  for 2 days of chest pain and week of SI/HI. He carries the psychiatric diagnoses of MDD, severe, recurrent, without psychosis and has a past medical history of  atherosclerotic CAD, COPD, MDD without psychosis, hypertension, hx of cocaine use, history of NSTEMI 06/2020, underwent Left cardiac cath showed 30% prox LAD stenosis and 20% mid RCA stenosis.  He is a voluntary admission from [place].  Apparently has his own place to stay.  He currently denies any suicidal ideation.  He is currently on Remeron at nighttime.  Still feels depressed.  He did well last time on Abilify.  Principal Problem: MDD (major depressive disorder), recurrent episode, severe (HCC) Diagnosis: Principal Problem:   MDD (major depressive disorder), recurrent episode, severe (HCC) Active Problems:   Cocaine abuse (HCC)  Total Time spent with patient: 15 minutes  Past Psychiatric History: Polysubstance abuse and depression  Past Medical History:  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   Depression    Elevated serum creatinine 08/28/2019   Homelessness 06/19/2020   Homicidal ideations 05/25/2023   Hypertension    Major depressive disorder, recurrent episode, 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/11/2022   Major depressive disorder, single episode, 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/10/2022   MDD (major depressive disorder), recurrent severe,  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   NSTEMI (non-ST elevated myocardial infarction) (HCC)    Schizoaffective disorder (HCC) 11/08/2022   Status post incision and drainage 04/22/2021   Suicidal ideation 06/05/2020   Suicidal ideation 06/05/2020    Past Surgical History:  Procedure Laterality Date   APPENDECTOMY     EYE SURGERY     LEFT HEART CATH AND CORONARY ANGIOGRAPHY N/A 06/05/2020   Procedure: LEFT HEART CATH AND CORONARY ANGIOGRAPHY;  Surgeon: Elder Negus, MD;  Location: [facility] INVASIVE [facility];  Service: Cardiovascular;  Laterality: N/A;   PROSTATE SURGERY     Family History:  Family History  Problem Relation Age of Onset   Hypertension Mother    Diabetes Mother    Hypertension Father    Hypertension Sister    Family Psychiatric  History: Unremarkable Social History:  Social History   Substance and Sexual Activity  Alcohol Use Yes   Comment: 4 40oz beer a week     Social History   Substance and Sexual Activity  Drug Use Yes   Types: Cocaine   Comment: cocaine use once a month, last use 6 mos ago    Social History   Socioeconomic History   Marital status: Single    Spouse name: Not on file   Number of children: Not on file   Years of education: Not on file   Highest education level: Not on file  Occupational History   Not on file  Tobacco Use   Smoking status: Some Days    Current packs/day: 0.00    Types: Cigarettes  Last attempt to quit: 12/25/2020    Years since quitting: 2.8   Smokeless tobacco: Never   Tobacco comments:    Smokes a couple cigarettes every now and then  Vaping Use   Vaping status: Never Used  Substance and Sexual Activity   Alcohol use: Yes    Comment: 4 40oz beer a week   Drug use: Yes    Types: Cocaine    Comment: cocaine use once a month, last use 6 mos ago   Sexual activity: Not Currently  Other Topics Concern   Not on file  Social History Narrative   His mother passed at age 84 (when he was 14 years old) and his  father raised him and his siblings   His parents were pastors as a lot of his sisters and other family members are today   There was a total of 13 children in his family Had 67 sisters, one sister deceased , Had 3 brothers, all brothers deceased   He is the youngest of the 78 and was "always in trouble in my younger years"   Family still live in [place], [place], some in [place], [place]   Has a Daughter and son with a total of 10 grandchildren (6 grand daughters local and 4 grandsons)    Values family   Married twice, present wife incarcerated   Social Drivers of Corporate investment banker Strain: Low Risk  (10/19/2022)   Overall Financial Resource Strain (CARDIA)    Difficulty of Paying Living Expenses: Not hard at all  Food Insecurity: No Food Insecurity (10/11/2023)   Hunger Vital Sign    Worried About Running Out of Food in the Last Year: Never true    Ran Out of Food in the Last Year: Never true  Recent Concern: Food Insecurity - Food Insecurity Present (09/16/2023)   Hunger Vital Sign    Worried About Running Out of Food in the Last Year: Sometimes true    Ran Out of Food in the Last Year: Sometimes true  Transportation Needs: No Transportation Needs (10/11/2023)   PRAPARE - Administrator, Civil Service (Medical): No    Lack of Transportation (Non-Medical): No  Recent Concern: Transportation Needs - Unmet Transportation Needs (10/10/2023)   PRAPARE - Transportation    Lack of Transportation (Medical): Yes    Lack of Transportation (Non-Medical): Yes  Physical Activity: Inactive (10/19/2022)   Exercise Vital Sign    Days of Exercise per Week: 0 days    Minutes of Exercise per Session: 0 min  Stress: No Stress Concern Present (10/19/2022)   [name] of Occupational Health - Occupational Stress Questionnaire    Feeling of Stress : Not at all  Social Connections: Moderately Integrated (03/31/2022)   Social Connection and Isolation Panel [NHANES]     Frequency of Communication with Friends and Family: Three times a week    Frequency of Social Gatherings with Friends and Family: Three times a week    Attends Religious Services: 1 to 4 times per year    Active Member of Clubs or Organizations: Yes    Attends Banker Meetings: 1 to 4 times per year    Marital Status: Never married   Additional Social History:                         Sleep: Good  Appetite:  Good  Current Medications: Current Facility-Administered Medications  Medication Dose Route Frequency  Provider Last Rate Last Admin   acetaminophen (TYLENOL) tablet 650 mg  650 mg Oral Q6H PRN Starkes-Perry, Juel Burrow, FNP       alum & mag hydroxide-simeth (MAALOX/MYLANTA) 200-200-20 MG/5ML suspension 30 mL  30 mL Oral Q4H PRN Starkes-Perry, Juel Burrow, FNP       atorvastatin (LIPITOR) tablet 80 mg  80 mg Oral Daily Maryagnes Amos, FNP   80 mg at 10/18/23 0940   diltiazem (CARDIZEM CD) 24 hr capsule 180 mg  180 mg Oral Daily Lewanda Rife, MD   180 mg at 10/18/23 0940   docusate sodium (COLACE) capsule 100 mg  100 mg Oral BID Lewanda Rife, MD   100 mg at 10/18/23 0940   guaiFENesin (ROBITUSSIN) 100 MG/5ML liquid 5 mL  5 mL Oral Q6H PRN Lewanda Rife, MD   5 mL at 10/17/23 2124   latanoprost (XALATAN) 0.005 % ophthalmic solution 1 drop  1 drop Both Eyes QHS Maryagnes Amos, FNP   1 drop at 10/17/23 2127   magnesium hydroxide (MILK OF MAGNESIA) suspension 30 mL  30 mL Oral Daily PRN Maryagnes Amos, FNP   30 mL at 10/12/23 1718   melatonin tablet 5 mg  5 mg Oral QHS Lewanda Rife, MD   5 mg at 10/17/23 2125   menthol-cetylpyridinium (CEPACOL) lozenge 3 mg  1 lozenge Oral PRN Lewanda Rife, MD   3 mg at 10/17/23 1203   mirtazapine (REMERON) tablet 30 mg  30 mg Oral QHS Lewanda Rife, MD   30 mg at 10/17/23 2125   mometasone-formoterol (DULERA) 200-5 MCG/ACT inhaler 2 puff  2 puff Inhalation BID Maryagnes Amos, FNP    2 puff at 10/18/23 0940   OLANZapine zydis (ZYPREXA) disintegrating tablet 5 mg  5 mg Oral TID PRN Maryagnes Amos, FNP       pantoprazole (PROTONIX) EC tablet 20 mg  20 mg Oral Daily Maryagnes Amos, FNP   20 mg at 10/18/23 0941   rivaroxaban (XARELTO) tablet 10 mg  10 mg Oral Daily Maryagnes Amos, FNP   10 mg at 10/18/23 0941   senna-docusate (Senokot-S) tablet 1 tablet  1 tablet Oral QHS PRN Maryagnes Amos, FNP   1 tablet at 10/13/23 2107    Lab Results: No results found for this or any previous visit (from the past 48 hours).  Blood Alcohol level:  Lab Results  Component Value Date   ETH <10 10/10/2023   ETH <10 09/15/2023    Metabolic Disorder Labs: Lab Results  Component Value Date   HGBA1C 5.1 09/15/2023   MPG 99.67 09/15/2023   MPG 103 01/13/2023   No results found for: "PROLACTIN" Lab Results  Component Value Date   CHOL 159 09/15/2023   TRIG 53 09/15/2023   HDL 61 09/15/2023   CHOLHDL 2.6 09/15/2023   VLDL 11 09/15/2023   LDLCALC 87 09/15/2023   LDLCALC 99 01/13/2023    Physical Findings: AIMS:  , ,  ,  ,    CIWA:    COWS:     Musculoskeletal: Strength & Muscle Tone: within normal limits Gait & Station: normal Patient leans: N/A  Psychiatric Specialty Exam:  Presentation  General Appearance:  Appropriate for Environment  Eye Contact: -- (Staring at the wall when talking, minimal eye contact with the interviewer)  Speech: Normal Rate  Speech Volume: Normal  Handedness: Right   Mood and Affect  Mood: Depressed  Affect: Congruent; Depressed   Thought Process  Thought  Processes: Coherent  Descriptions of Associations:Intact  Orientation:Full (Time, Place and Person)  Thought Content:Logical  History of Schizophrenia/Schizoaffective disorder:No  Duration of Psychotic Symptoms:N/A  Hallucinations:No data recorded Ideas of Reference:None  Suicidal Thoughts:No data recorded Homicidal Thoughts:No  data recorded  Sensorium  Memory: Immediate Good; Recent Good; Remote Good  Judgment: Poor  Insight: Fair   Chartered certified accountant: Fair  Attention Span: Fair  Recall: [employer] of Knowledge: Fair  Language: Fair   Psychomotor Activity  Psychomotor Activity:No data recorded  Assets  Assets: Social Support; Desire for Improvement   Sleep  Sleep:No data recorded    Blood pressure 125/63, pulse 80, temperature (!) 97.5 F (36.4 C), resp. rate 14, height 6\' 4"  (1.93 m), weight 112.5 kg, SpO2 100%. Body mass index is 30.19 kg/m.   Treatment Plan Summary: Daily contact with patient to assess and evaluate symptoms and progress in treatment, Medication management, and Plan start Abilify 5 mg/day.  [name], DO 10/18/2023, 12:22 PM

## 2023-10-19 DIAGNOSIS — F332 Major depressive disorder, recurrent severe without psychotic features: Secondary | ICD-10-CM | POA: Diagnosis not present

## 2023-10-19 MED ORDER — ARIPIPRAZOLE 5 MG PO TABS
10.0000 mg | ORAL_TABLET | Freq: Every day | ORAL | Status: DC
  Administered 2023-10-20: 10 mg via ORAL
  Filled 2023-10-19: qty 2

## 2023-10-19 NOTE — Progress Notes (Signed)
[facility] MD Progress Note  10/19/2023 12:34 PM Robert Chan  MRN:  [id number] Subjective: Robert Chan is seen on rounds.  Yesterday we started him on Abilify.  He is done well on Abilify in the past.  Last time he left he did not fill his prescriptions and ended up using cocaine and then went in had some cardiac problems.  He told the medical doctors that he was suicidal.  So far he is doing well on Abilify and Remeron. Principal Problem: MDD (major depressive disorder), recurrent episode, severe (HCC) Diagnosis: Principal Problem:   MDD (major depressive disorder), recurrent episode, severe (HCC) Active Problems:   Cocaine abuse (HCC)  Total Time spent with patient: 15 minutes  Past Psychiatric History: Polysubstance abuse and depression  Past Medical History:  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   Depression    Elevated serum creatinine 08/28/2019   Homelessness 06/19/2020   Homicidal ideations 05/25/2023   Hypertension    Major depressive disorder, recurrent episode, 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/11/2022   Major depressive disorder, single episode, 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/10/2022   MDD (major depressive disorder), recurrent severe, 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   NSTEMI (non-ST elevated myocardial infarction) (HCC)    Schizoaffective disorder (HCC) 11/08/2022   Status post incision and drainage 04/22/2021   Suicidal ideation 06/05/2020   Suicidal ideation 06/05/2020    Past Surgical History:  Procedure Laterality Date   APPENDECTOMY     EYE SURGERY     LEFT HEART CATH AND CORONARY ANGIOGRAPHY N/A 06/05/2020   Procedure: LEFT HEART CATH AND CORONARY ANGIOGRAPHY;  Surgeon: Elder Negus, MD;  Location: [facility]  INVASIVE CV LAB;  Service: Cardiovascular;  Laterality: N/A;   PROSTATE SURGERY     Family History:  Family History  Problem Relation Age of Onset   Hypertension Mother    Diabetes Mother    Hypertension Father    Hypertension Sister    Family Psychiatric  History: Unremarkable Social History:  Social History   Substance and Sexual Activity  Alcohol Use Yes   Comment: 4 40oz beer a week     Social History   Substance and Sexual Activity  Drug Use Yes   Types: Cocaine   Comment: cocaine use once a month, last use 6 mos ago    Social History   Socioeconomic History   Marital status: Single    Spouse name: Not on file   Number of children: Not on file   Years of education: Not on file   Highest education level: Not on file  Occupational History   Not on file  Tobacco Use   Smoking status: Some Days    Current packs/day: 0.00    Types: Cigarettes    Last attempt to quit: 12/25/2020    Years since quitting: 2.8   Smokeless tobacco: Never   Tobacco comments:    Smokes a couple cigarettes every now and then  Vaping Use   Vaping status: Never Used  Substance and Sexual Activity   Alcohol use: Yes    Comment: 4 40oz beer a week   Drug use: Yes    Types: Cocaine    Comment: cocaine use once a month,  last use 6 mos ago   Sexual activity: Not Currently  Other Topics Concern   Not on file  Social History Narrative   His mother passed at age 40 (when he was 78 years old) and his father raised him and his siblings   His parents were pastors as a lot of his sisters and other family members are today   There was a total of 13 children in his family Had 16 sisters, one sister deceased , Had 3 brothers, all brothers deceased   He is the youngest of the 63 and was "always in trouble in my younger years"   Family still live in [place], [place], some in [place], [place]   Has a Daughter and son with a total of 10 grandchildren (6 grand daughters local and 4 grandsons)     Values family   Married twice, present wife incarcerated   Social Drivers of Health   Financial Resource Strain: Low Risk  (10/19/2022)   Overall Financial Resource Strain (CARDIA)    Difficulty of Paying Living Expenses: Not hard at all  Food Insecurity: No Food Insecurity (10/11/2023)   Hunger Vital Sign    Worried About Running Out of Food in the Last Year: Never true    Ran Out of Food in the Last Year: Never true  Recent Concern: Food Insecurity - Food Insecurity Present (09/16/2023)   Hunger Vital Sign    Worried About Running Out of Food in the Last Year: Sometimes true    Ran Out of Food in the Last Year: Sometimes true  Transportation Needs: No Transportation Needs (10/11/2023)   PRAPARE - Administrator, Civil Service (Medical): No    Lack of Transportation (Non-Medical): No  Recent Concern: Transportation Needs - Unmet Transportation Needs (10/10/2023)   PRAPARE - Transportation    Lack of Transportation (Medical): Yes    Lack of Transportation (Non-Medical): Yes  Physical Activity: Inactive (10/19/2022)   Exercise Vital Sign    Days of Exercise per Week: 0 days    Minutes of Exercise per Session: 0 min  Stress: No Stress Concern Present (10/19/2022)   [name] of Occupational Health - Occupational Stress Questionnaire    Feeling of Stress : Not at all  Social Connections: Moderately Integrated (03/31/2022)   Social Connection and Isolation Panel [NHANES]    Frequency of Communication with Friends and Family: Three times a week    Frequency of Social Gatherings with Friends and Family: Three times a week    Attends Religious Services: 1 to 4 times per year    Active Member of Clubs or Organizations: Yes    Attends Banker Meetings: 1 to 4 times per year    Marital Status: Never married   Additional Social History:                         Sleep: Good  Appetite:  Good  Current Medications: Current Facility-Administered  Medications  Medication Dose Route Frequency Provider Last Rate Last Admin   acetaminophen (TYLENOL) tablet 650 mg  650 mg Oral Q6H PRN Maryagnes Amos, FNP   650 mg at 10/19/23 1139   alum & mag hydroxide-simeth (MAALOX/MYLANTA) 200-200-20 MG/5ML suspension 30 mL  30 mL Oral Q4H PRN Maryagnes Amos, FNP       ARIPiprazole (ABILIFY) tablet 5 mg  5 mg Oral QPC breakfast [name], DO   5 mg at 10/19/23 [phone]  atorvastatin (LIPITOR) tablet 80 mg  80 mg Oral Daily Maryagnes Amos, FNP   80 mg at 10/19/23 0953   diltiazem (CARDIZEM CD) 24 hr capsule 180 mg  180 mg Oral Daily Lewanda Rife, MD   180 mg at 10/19/23 0954   docusate sodium (COLACE) capsule 100 mg  100 mg Oral BID Lewanda Rife, MD   100 mg at 10/19/23 0953   guaiFENesin (ROBITUSSIN) 100 MG/5ML liquid 5 mL  5 mL Oral Q6H PRN Lewanda Rife, MD   5 mL at 10/17/23 2124   latanoprost (XALATAN) 0.005 % ophthalmic solution 1 drop  1 drop Both Eyes QHS Maryagnes Amos, FNP   1 drop at 10/18/23 2123   magnesium hydroxide (MILK OF MAGNESIA) suspension 30 mL  30 mL Oral Daily PRN Maryagnes Amos, FNP   30 mL at 10/12/23 1718   melatonin tablet 5 mg  5 mg Oral QHS Lewanda Rife, MD   5 mg at 10/18/23 2120   menthol-cetylpyridinium (CEPACOL) lozenge 3 mg  1 lozenge Oral PRN Lewanda Rife, MD   3 mg at 10/17/23 1203   mirtazapine (REMERON) tablet 30 mg  30 mg Oral QHS Lewanda Rife, MD   30 mg at 10/18/23 2119   mometasone-formoterol (DULERA) 200-5 MCG/ACT inhaler 2 puff  2 puff Inhalation BID Maryagnes Amos, FNP   2 puff at 10/19/23 0952   OLANZapine zydis (ZYPREXA) disintegrating tablet 5 mg  5 mg Oral TID PRN Maryagnes Amos, FNP       pantoprazole (PROTONIX) EC tablet 20 mg  20 mg Oral Daily Maryagnes Amos, FNP   20 mg at 10/19/23 1610   rivaroxaban (XARELTO) tablet 10 mg  10 mg Oral Daily Maryagnes Amos, FNP   10 mg at 10/19/23 9604    senna-docusate (Senokot-S) tablet 1 tablet  1 tablet Oral QHS PRN Maryagnes Amos, FNP   1 tablet at 10/13/23 2107    Lab Results: No results found for this or any previous visit (from the past 48 hours).  Blood Alcohol level:  Lab Results  Component Value Date   ETH <10 10/10/2023   ETH <10 09/15/2023    Metabolic Disorder Labs: Lab Results  Component Value Date   HGBA1C 5.1 09/15/2023   MPG 99.67 09/15/2023   MPG 103 01/13/2023   No results found for: "PROLACTIN" Lab Results  Component Value Date   CHOL 159 09/15/2023   TRIG 53 09/15/2023   HDL 61 09/15/2023   CHOLHDL 2.6 09/15/2023   VLDL 11 09/15/2023   LDLCALC 87 09/15/2023   LDLCALC 99 01/13/2023    Physical Findings: AIMS:  , ,  ,  ,    CIWA:    COWS:     Musculoskeletal: Strength & Muscle Tone: within normal limits Gait & Station: normal Patient leans: N/A  Psychiatric Specialty Exam:  Presentation  General Appearance:  Appropriate for Environment  Eye Contact: -- (Staring at the wall when talking, minimal eye contact with the interviewer)  Speech: Normal Rate  Speech Volume: Normal  Handedness: Right   Mood and Affect  Mood: Depressed  Affect: Congruent; Depressed   Thought Process  Thought Processes: Coherent  Descriptions of Associations:Intact  Orientation:Full (Time, Place and Person)  Thought Content:Logical  History of Schizophrenia/Schizoaffective disorder:No  Duration of Psychotic Symptoms:N/A  Hallucinations:No data recorded Ideas of Reference:None  Suicidal Thoughts:No data recorded Homicidal Thoughts:No data recorded  Sensorium  Memory: Immediate Good; Recent Good; Remote Good  Judgment: Poor  Insight: Fair   Chartered certified accountant: Fair  Attention Span: Fair  Recall: Jennelle Human of Knowledge: Fair  Language: Fair   Psychomotor Activity  Psychomotor Activity:No data recorded  Assets  Assets: Social Support;  Desire for Improvement   Sleep  Sleep:No data recorded    Blood pressure (!) 172/81, pulse 75, temperature (!) 97.2 F (36.2 C), resp. rate (!) 21, height 6\' 4"  (1.93 m), weight 112.5 kg, SpO2 100%. Body mass index is 30.19 kg/m.   Treatment Plan Summary: Daily contact with patient to assess and evaluate symptoms and progress in treatment, Medication management, and Plan increase Abilify to 10 mg/day.  [name], DO 10/19/2023, 12:34 PM

## 2023-10-19 NOTE — Group Note (Signed)
Date:  10/19/2023 Time:  5:23 PM  Group Topic/Focus:  Making Healthy Choices:   The focus of this group is to help patients identify negative/unhealthy choices they were using prior to admission and identify positive/healthier coping strategies to replace them upon discharge.    Participation Level:  Active  Participation Quality:  Appropriate  Affect:  Appropriate  Cognitive:  Appropriate  Insight: Appropriate  Engagement in Group:  Engaged  Modes of Intervention:  Discussion   [name] 10/19/2023, 5:23 PM

## 2023-10-19 NOTE — Group Note (Signed)
Date:  10/19/2023 Time:  8:36 PM  Group Topic/Focus:  Coping With Mental Health Crisis:   The purpose of this group is to help patients identify strategies for coping with mental health crisis.  Group discusses possible causes of crisis and ways to manage them effectively.    Participation Level:  Active  Participation Quality:  Appropriate  Affect:  Appropriate  Cognitive:  Appropriate  Insight: Appropriate  Engagement in Group:  Engaged  Modes of Intervention:  Education  Additional Comments:    Garry Heater 10/19/2023, 8:36 PM

## 2023-10-19 NOTE — Progress Notes (Signed)
   10/19/23 1700  Psych Admission Type (Psych Patients Only)  Admission Status Voluntary  Psychosocial Assessment  Patient Complaints Depression  Eye Contact Fair  Facial Expression Flat  Affect Flat  Speech Logical/coherent  Interaction Assertive  Motor Activity Slow  Appearance/Hygiene In scrubs  Behavior Characteristics Cooperative  Mood Pleasant  Thought Process  Coherency WDL  Content WDL  Delusions None reported or observed  Perception WDL  Hallucination None reported or observed  Judgment Impaired  Confusion None  Danger to Self  Current suicidal ideation? Denies  Self-Injurious Behavior No self-injurious ideation or behavior indicators observed or expressed   Agreement Not to Harm Self Yes  Description of Agreement verbal  Danger to Others  Danger to Others None reported or observed

## 2023-10-20 DIAGNOSIS — F332 Major depressive disorder, recurrent severe without psychotic features: Secondary | ICD-10-CM | POA: Diagnosis not present

## 2023-10-20 MED ORDER — ARIPIPRAZOLE 5 MG PO TABS
15.0000 mg | ORAL_TABLET | Freq: Every day | ORAL | Status: DC
  Administered 2023-10-21 – 2023-11-01 (×12): 15 mg via ORAL
  Filled 2023-10-20 (×12): qty 3

## 2023-10-20 NOTE — Group Note (Signed)
Recreation Therapy Group Note   Group Topic:Emotion Expression  Group Date: 10/20/2023 Start Time: 1400 End Time: 1450 Facilitators: Rosina Lowenstein, LRT, CTRS Location:  Dayroom  Group Description: Positivity Collage. LRT and patients discussed the importance of having a positive mindset and being happy. Patients received magazines, safety scissors, a glue stick and a piece of paper. Pts were encouraged to find images or words in the magazines that showed "happiness" or positivity to them. Pt shared their collage with the group once they were finished. LRT and pts discussed how it can be difficult to always have a positive mindset, especially when they have mental health challenges.   Goal Area(s) Addressed:  Pt will identify things associate with positivity. Pt will reduce negative thinking. Pt will identify a new coping skill of thinking positive thoughts.    Affect/Mood: Appropriate and Flat   Participation Level: Active and Engaged   Participation Quality: Independent   Behavior: Calm and Cooperative   Speech/Thought Process: Coherent   Insight: Fair   Judgement: Good   Modes of Intervention: Art, Education, Exploration, and Guided Discussion   Patient Response to Interventions:  Attentive and Receptive   Education Outcome:  Acknowledges education   Clinical Observations/Individualized Feedback: Amdrew was active in their participation of session activities and group discussion. Pt identified "traveling, being on a boat, and to have a dream house" as positive things. Pt interacted well with LRT and peers duration of session.    Plan: Continue to engage patient in RT group sessions 2-3x/week.   Rosina Lowenstein, LRT, CTRS 10/20/2023 4:39 PM

## 2023-10-20 NOTE — Plan of Care (Signed)

## 2023-10-20 NOTE — Group Note (Signed)
Date:  10/20/2023 Time:  8:34 PM  Group Topic/Focus:  Goals Group:   The focus of this group is to help patients establish daily goals to achieve during treatment and discuss how the patient can incorporate goal setting into their daily lives to aide in recovery.    Participation Level:  Active  Participation Quality:  Appropriate  Affect:  Appropriate  Cognitive:  Appropriate  Insight: Appropriate  Engagement in Group:  Engaged  Modes of Intervention:  Discussion    Lenore Cordia 10/20/2023, 8:34 PM

## 2023-10-20 NOTE — Progress Notes (Signed)
   10/20/23 2000  Psych Admission Type (Psych Patients Only)  Admission Status Voluntary  Psychosocial Assessment  Eye Contact Fair  Facial Expression Flat  Affect Flat  Speech Logical/coherent  Interaction Assertive  Motor Activity Slow  Appearance/Hygiene In scrubs  Behavior Characteristics Cooperative  Mood Pleasant  Thought Process  Coherency WDL  Content WDL  Delusions None reported or observed  Perception WDL  Hallucination None reported or observed  Judgment Impaired  Confusion None  Danger to Self  Current suicidal ideation? Denies  Self-Injurious Behavior No self-injurious ideation or behavior indicators observed or expressed   Agreement Not to Harm Self Yes  Description of Agreement verbal  Danger to Others  Danger to Others None reported or observed

## 2023-10-20 NOTE — Group Note (Signed)
Date:  10/20/2023 Time:  1:33 PM  Group Topic/Focus:  Emotional Education:   The focus of this group is to discuss what feelings/emotions are, and how they are experienced. Healthy Communication:   The focus of this group is to discuss communication, barriers to communication, as well as healthy ways to communicate with others.    Participation Level:  Active  Participation Quality:  Appropriate, Attentive, Sharing, and Supportive  Affect:  Appropriate  Cognitive:  Alert, Appropriate, and Oriented  Insight: Appropriate, Good, and Improving  Engagement in Group:  Engaged  Modes of Intervention:  Activity, Problem-solving, and Socialization  Additional Comments:     Robert Chan 10/20/2023, 1:33 PM

## 2023-10-20 NOTE — Plan of Care (Signed)
  Problem: Education: Goal: Knowledge of General Education information will improve Description: Including pain rating scale, medication(s)/side effects and non-pharmacologic comfort measures Outcome: Progressing   Problem: Health Behavior/Discharge Planning: Goal: Ability to manage health-related needs will improve Outcome: Progressing   Problem: Clinical Measurements: Goal: Ability to maintain clinical measurements within normal limits will improve Outcome: Progressing Goal: Will remain free from infection Outcome: Progressing Goal: Diagnostic test results will improve Outcome: Progressing Goal: Respiratory complications will improve Outcome: Progressing Goal: Cardiovascular complication will be avoided Outcome: Progressing   Problem: Activity: Goal: Risk for activity intolerance will decrease Outcome: Progressing   Problem: Nutrition: Goal: Adequate nutrition will be maintained Outcome: Progressing   Problem: Coping: Goal: Level of anxiety will decrease Outcome: Progressing   Problem: Elimination: Goal: Will not experience complications related to bowel motility Outcome: Progressing Goal: Will not experience complications related to urinary retention Outcome: Progressing   Problem: Pain Management: Goal: General experience of comfort will improve Outcome: Progressing   Problem: Safety: Goal: Ability to remain free from injury will improve Outcome: Progressing   Problem: Skin Integrity: Goal: Risk for impaired skin integrity will decrease Outcome: Progressing   Problem: Education: Goal: Utilization of techniques to improve thought processes will improve Outcome: Progressing

## 2023-10-20 NOTE — Group Note (Signed)
[facility] LCSW Group Therapy Note   Group Date: 10/20/2023 Start Time: 1300 End Time: 1330   Type of Therapy/Topic:  Group Therapy:  Balance in Life  Participation Level:  Did Not Attend   Description of Group:    This group will address the concept of balance and how it feels and looks when one is unbalanced. Patients will be encouraged to process areas in their lives that are out of balance, and identify reasons for remaining unbalanced. Facilitators will guide patients utilizing problem- solving interventions to address and correct the stressor making their life unbalanced. Understanding and applying boundaries will be explored and addressed for obtaining  and maintaining a balanced life. Patients will be encouraged to explore ways to assertively make their unbalanced needs known to significant others in their lives, using other group members and facilitator for support and feedback.  Therapeutic Goals: Patient will identify two or more emotions or situations they have that consume much of in their lives. Patient will identify signs/triggers that life has become out of balance:  Patient will identify two ways to set boundaries in order to achieve balance in their lives:  Patient will demonstrate ability to communicate their needs through discussion and/or role plays  Summary of Patient Progress:    X    Therapeutic Modalities:   Cognitive Behavioral Therapy Solution-Focused Therapy Assertiveness Training   Elza Rafter, LCSWA

## 2023-10-20 NOTE — Progress Notes (Signed)
   10/20/23 0731  Psych Admission Type (Psych Patients Only)  Admission Status Voluntary  Psychosocial Assessment  Patient Complaints Depression  Eye Contact Fair  Facial Expression Flat  Affect Sullen  Speech Logical/coherent  Interaction Assertive  Motor Activity Slow  Appearance/Hygiene Unremarkable  Behavior Characteristics Cooperative  Mood Pleasant  Thought Process  Coherency WDL  Content WDL  Delusions None reported or observed  Perception WDL  Hallucination None reported or observed  Judgment WDL  Confusion None  Danger to Self  Current suicidal ideation? Denies  Danger to Others  Danger to Others None reported or observed

## 2023-10-20 NOTE — Progress Notes (Addendum)
[facility] MD Progress Note  10/20/2023 12:26 PM Robert Chan  MRN:  [id number] Subjective: Robert Chan is seen on rounds.  He is doing a little bit better since starting on Abilify.  He asks if we can go up on his Abilify because it has been helpful for his mood.  He is a fairly large man about 250 pounds.  He still having intermittent suicidal thoughts.  He is not very reliable when he gets discharged because he goes out and uses cocaine which is not good because he has a heart problem which then he gets chest pain and then eventually gets referred back to psychiatry.  He is able to contract for safety in the hospital.  Has been in good controls.  Has been compliant with medications and without any side effects. Principal Problem: MDD (major depressive disorder), recurrent episode, severe (HCC) Diagnosis: Principal Problem:   MDD (major depressive disorder), recurrent episode, severe (HCC) Active Problems:   Cocaine abuse (HCC)  Total Time spent with patient: 15 minutes  Past Psychiatric History: Extensive history of noncompliance and polysubstance abuse and depression  Past Medical History:  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   Depression    Elevated serum creatinine 08/28/2019   Homelessness 06/19/2020   Homicidal ideations 05/25/2023   Hypertension    Major depressive disorder, recurrent episode, 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/11/2022   Major depressive disorder, single episode, 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/10/2022   MDD (major depressive disorder), recurrent severe, 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   NSTEMI (non-ST elevated myocardial infarction) (HCC)    Schizoaffective disorder (HCC) 11/08/2022   Status post incision and  drainage 04/22/2021   Suicidal ideation 06/05/2020   Suicidal ideation 06/05/2020    Past Surgical History:  Procedure Laterality Date   APPENDECTOMY     EYE SURGERY     LEFT HEART CATH AND CORONARY ANGIOGRAPHY N/A 06/05/2020   Procedure: LEFT HEART CATH AND CORONARY ANGIOGRAPHY;  Surgeon: Elder Negus, MD;  Location: [facility];  Service: Cardiovascular;  Laterality: N/A;   PROSTATE SURGERY     Family History:  Family History  Problem Relation Age of Onset   Hypertension Mother    Diabetes Mother    Hypertension Father    Hypertension Sister    Family Psychiatric  History: Unremarkable Social History:  Social History   Substance and Sexual Activity  Alcohol Use Yes   Comment: 4 40oz beer a week     Social History   Substance and Sexual Activity  Drug Use Yes   Types: Cocaine   Comment: cocaine use once a month, last use 6 mos ago    Social History   Socioeconomic History   Marital status: Single    Spouse name: Not on file   Number of children: Not on file   Years of education: Not on file   Highest education level: Not on file  Occupational History   Not on file  Tobacco Use   Smoking status: Some Days    Current packs/day: 0.00    Types: Cigarettes    Last attempt to quit: 12/25/2020    Years since quitting: 2.8   Smokeless tobacco: Never  Tobacco comments:    Smokes a couple cigarettes every now and then  Vaping Use   Vaping status: Never Used  Substance and Sexual Activity   Alcohol use: Yes    Comment: 4 40oz beer a week   Drug use: Yes    Types: Cocaine    Comment: cocaine use once a month, last use 6 mos ago   Sexual activity: Not Currently  Other Topics Concern   Not on file  Social History Narrative   His mother passed at age 47 (when he was 65 years old) and his father raised him and his siblings   His parents were pastors as a lot of his sisters and other family members are today   There was a total of 13 children in his  family Had 50 sisters, one sister deceased , Had 3 brothers, all brothers deceased   He is the youngest of the 17 and was "always in trouble in my younger years"   Family still live in [place], [place], some in [place], [place]   Has a Daughter and son with a total of 10 grandchildren (6 grand daughters local and 4 grandsons)    Values family   Married twice, present wife incarcerated   Social Drivers of Corporate investment banker Strain: Low Risk  (10/19/2022)   Overall Financial Resource Strain (CARDIA)    Difficulty of Paying Living Expenses: Not hard at all  Food Insecurity: No Food Insecurity (10/11/2023)   Hunger Vital Sign    Worried About Running Out of Food in the Last Year: Never true    Ran Out of Food in the Last Year: Never true  Recent Concern: Food Insecurity - Food Insecurity Present (09/16/2023)   Hunger Vital Sign    Worried About Running Out of Food in the Last Year: Sometimes true    Ran Out of Food in the Last Year: Sometimes true  Transportation Needs: No Transportation Needs (10/11/2023)   PRAPARE - Administrator, Civil Service (Medical): No    Lack of Transportation (Non-Medical): No  Recent Concern: Transportation Needs - Unmet Transportation Needs (10/10/2023)   PRAPARE - Transportation    Lack of Transportation (Medical): Yes    Lack of Transportation (Non-Medical): Yes  Physical Activity: Inactive (10/19/2022)   Exercise Vital Sign    Days of Exercise per Week: 0 days    Minutes of Exercise per Session: 0 min  Stress: No Stress Concern Present (10/19/2022)   [name] of Occupational Health - Occupational Stress Questionnaire    Feeling of Stress : Not at all  Social Connections: Moderately Integrated (03/31/2022)   Social Connection and Isolation Panel [NHANES]    Frequency of Communication with Friends and Family: Three times a week    Frequency of Social Gatherings with Friends and Family: Three times a week    Attends  Religious Services: 1 to 4 times per year    Active Member of Clubs or Organizations: Yes    Attends Banker Meetings: 1 to 4 times per year    Marital Status: Never married   Additional Social History:                         Sleep: Good  Appetite:  Good  Current Medications: Current Facility-Administered Medications  Medication Dose Route Frequency Provider Last Rate Last Admin   acetaminophen (TYLENOL) tablet 650 mg  650 mg Oral Q6H PRN Starkes-Perry,  Juel Burrow, FNP   650 mg at 10/19/23 1139   alum & mag hydroxide-simeth (MAALOX/MYLANTA) 200-200-20 MG/5ML suspension 30 mL  30 mL Oral Q4H PRN Maryagnes Amos, FNP       ARIPiprazole (ABILIFY) tablet 10 mg  10 mg Oral QPC breakfast [name], DO   10 mg at 10/20/23 0950   atorvastatin (LIPITOR) tablet 80 mg  80 mg Oral Daily Maryagnes Amos, FNP   80 mg at 10/20/23 0950   diltiazem (CARDIZEM CD) 24 hr capsule 180 mg  180 mg Oral Daily Lewanda Rife, MD   180 mg at 10/20/23 0951   docusate sodium (COLACE) capsule 100 mg  100 mg Oral BID Lewanda Rife, MD   100 mg at 10/20/23 0950   guaiFENesin (ROBITUSSIN) 100 MG/5ML liquid 5 mL  5 mL Oral Q6H PRN Lewanda Rife, MD   5 mL at 10/19/23 1750   latanoprost (XALATAN) 0.005 % ophthalmic solution 1 drop  1 drop Both Eyes QHS Maryagnes Amos, FNP   1 drop at 10/19/23 2118   magnesium hydroxide (MILK OF MAGNESIA) suspension 30 mL  30 mL Oral Daily PRN Maryagnes Amos, FNP   30 mL at 10/12/23 1718   melatonin tablet 5 mg  5 mg Oral QHS Lewanda Rife, MD   5 mg at 10/19/23 2116   menthol-cetylpyridinium (CEPACOL) lozenge 3 mg  1 lozenge Oral PRN Lewanda Rife, MD   3 mg at 10/17/23 1203   mirtazapine (REMERON) tablet 30 mg  30 mg Oral QHS Lewanda Rife, MD   30 mg at 10/19/23 2115   mometasone-formoterol (DULERA) 200-5 MCG/ACT inhaler 2 puff  2 puff Inhalation BID Maryagnes Amos, FNP   2 puff at 10/20/23  0950   OLANZapine zydis (ZYPREXA) disintegrating tablet 5 mg  5 mg Oral TID PRN Maryagnes Amos, FNP       pantoprazole (PROTONIX) EC tablet 20 mg  20 mg Oral Daily Maryagnes Amos, FNP   20 mg at 10/20/23 8413   rivaroxaban (XARELTO) tablet 10 mg  10 mg Oral Daily Maryagnes Amos, FNP   10 mg at 10/20/23 0950   senna-docusate (Senokot-S) tablet 1 tablet  1 tablet Oral QHS PRN Maryagnes Amos, FNP   1 tablet at 10/13/23 2107    Lab Results: No results found for this or any previous visit (from the past 48 hours).  Blood Alcohol level:  Lab Results  Component Value Date   ETH <10 10/10/2023   ETH <10 09/15/2023    Metabolic Disorder Labs: Lab Results  Component Value Date   HGBA1C 5.1 09/15/2023   MPG 99.67 09/15/2023   MPG 103 01/13/2023   No results found for: "PROLACTIN" Lab Results  Component Value Date   CHOL 159 09/15/2023   TRIG 53 09/15/2023   HDL 61 09/15/2023   CHOLHDL 2.6 09/15/2023   VLDL 11 09/15/2023   LDLCALC 87 09/15/2023   LDLCALC 99 01/13/2023    Physical Findings: AIMS:  , ,  ,  ,    CIWA:    COWS:     Musculoskeletal: Strength & Muscle Tone: within normal limits Gait & Station: normal Patient leans: N/A  Psychiatric Specialty Exam:  Presentation  General Appearance:  Appropriate for Environment  Eye Contact: -- (Staring at the wall when talking, minimal eye contact with the interviewer)  Speech: Normal Rate  Speech Volume: Normal  Handedness: Right   Mood and Affect  Mood: Depressed  Affect:  Congruent; Depressed   Thought Process  Thought Processes: Coherent  Descriptions of Associations:Intact  Orientation:Full (Time, Place and Person)  Thought Content:Logical  History of Schizophrenia/Schizoaffective disorder:No  Duration of Psychotic Symptoms:N/A  Hallucinations:No data recorded Ideas of Reference:None  Suicidal Thoughts:No data recorded Homicidal Thoughts:No data  recorded  Sensorium  Memory: Immediate Good; Recent Good; Remote Good  Judgment: Poor  Insight: Fair   Chartered certified accountant: Fair  Attention Span: Fair  Recall: [employer] of Knowledge: Fair  Language: Fair   Psychomotor Activity  Psychomotor Activity:No data recorded  Assets  Assets: Social Support; Desire for Improvement   Sleep  Sleep:No data recorded    Blood pressure 113/68, pulse 69, temperature 98.2 F (36.8 C), resp. rate 18, height 6\' 4"  (1.93 m), weight 112.5 kg, SpO2 97%. Body mass index is 30.19 kg/m.   Treatment Plan Summary: Daily contact with patient to assess and evaluate symptoms and progress in treatment, Medication management, and Plan continue current medication.  Increase Abilify to 15 mg/day.  [name], DO 10/20/2023, 12:26 PM

## 2023-10-21 DIAGNOSIS — F332 Major depressive disorder, recurrent severe without psychotic features: Secondary | ICD-10-CM | POA: Diagnosis not present

## 2023-10-21 NOTE — Group Note (Signed)
Date:  10/21/2023 Time:  10:28 PM  Group Topic/Focus:  Crisis Planning:   The purpose of this group is to help patients create a crisis plan for use upon discharge or in the future, as needed.    Participation Level:  Active  Participation Quality:  Appropriate  Affect:  Appropriate and Flat  Cognitive:  Appropriate  Insight: Appropriate  Engagement in Group:  Engaged  Modes of Intervention:  Discussion  Additional Comments:    Burt Ek 10/21/2023, 10:28 PM

## 2023-10-21 NOTE — Group Note (Signed)
Date:  10/21/2023 Time:  11:40 AM  Group Topic/Focus:  Making Healthy Choices:   The focus of this group is to help patients identify negative/unhealthy choices they were using prior to admission and identify positive/healthier coping strategies to replace them upon discharge.    Participation Level:  Active  Participation Quality:  Appropriate  Affect:  Appropriate  Cognitive:  Appropriate  Insight: Appropriate  Engagement in Group:  Engaged  Modes of Intervention:  Discussion   [name] 10/21/2023, 11:40 AM

## 2023-10-21 NOTE — Plan of Care (Signed)
  Problem: Pain Management: Goal: General experience of comfort will improve Outcome: Progressing   Problem: Safety: Goal: Ability to remain free from injury will improve Outcome: Progressing   Problem: Skin Integrity: Goal: Risk for impaired skin integrity will decrease Outcome: Progressing   Problem: Education: Goal: Utilization of techniques to improve thought processes will improve Outcome: Progressing

## 2023-10-21 NOTE — Group Note (Signed)
Recreation Therapy Group Note   Group Topic:General Recreation  Group Date: 10/21/2023 Start Time: 1400 End Time: 1455 Facilitators: Rosina Lowenstein, LRT, CTRS Location:  Day Room  Group Description: Bingo. LRT and patients played multiple games of Bingo with music playing in the background. LRT and pts discussed how this could be a leisure interest and the importance of doing things they enjoy post-discharge. Pts won stress balls and [employer] as Chief Financial Officer.   Goal Area(s) Addressed: Patient will identify leisure interests.  Patient will practice healthy decision making. Patient will engage in recreation activity.  Patient will increase communication.    Affect/Mood: Appropriate   Participation Level: Active and Engaged   Participation Quality: Independent   Behavior: Appropriate, Calm, and Cooperative   Speech/Thought Process: Coherent   Insight: Good   Judgement: Good   Modes of Intervention: Cooperative Play, Education, and Rapport Building   Patient Response to Interventions:  Attentive and Receptive   Education Outcome:  Acknowledges education   Clinical Observations/Individualized Feedback: Unnamed was active in their participation of session activities and group discussion. Pt won a Facilities manager and chose a stress ball as a prize. Pt interacted well with LRT and peers duration of session.    Plan: Continue to engage patient in RT group sessions 2-3x/week.   Rosina Lowenstein, LRT, CTRS 10/21/2023 4:39 PM

## 2023-10-21 NOTE — Progress Notes (Signed)
[facility] MD Progress Note  10/21/2023 2:17 PM Robert Chan  MRN:  [id number] Subjective: Robert Chan is seen on rounds.  He tells me that his family has left for the holidays.  He is not sure that he is going to have a place to go.  I did raise his Abilify and he got a higher dose starting this morning.  He is able to contract for safety in the hospital.  He has been pleasant and cooperative.  Nurses report no issues.  He is tolerating the medications without any problems.  He denies any side effects.  Mood and affect are slowly improving. Principal Problem: MDD (major depressive disorder), recurrent episode, severe (HCC) Diagnosis: Principal Problem:   MDD (major depressive disorder), recurrent episode, severe (HCC) Active Problems:   Cocaine abuse (HCC)  Total Time spent with patient: 15 minutes  Past Psychiatric History: Polysubstance abuse and depression  Past Medical History:  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   Depression    Elevated serum creatinine 08/28/2019   Homelessness 06/19/2020   Homicidal ideations 05/25/2023   Hypertension    Major depressive disorder, recurrent episode, 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/11/2022   Major depressive disorder, single episode, 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/10/2022   MDD (major depressive disorder), recurrent severe, 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   NSTEMI (non-ST elevated myocardial infarction) (HCC)    Schizoaffective disorder (HCC) 11/08/2022   Status post incision and drainage 04/22/2021   Suicidal ideation 06/05/2020   Suicidal ideation 06/05/2020    Past Surgical History:  Procedure Laterality Date   APPENDECTOMY     EYE SURGERY     LEFT HEART CATH AND CORONARY ANGIOGRAPHY  N/A 06/05/2020   Procedure: LEFT HEART CATH AND CORONARY ANGIOGRAPHY;  Surgeon: Elder Negus, MD;  Location: [facility] INVAS[facility];  Service: Cardiovascular;  Laterality: N/A;   PROSTATE SURGERY     Family History:  Family History  Problem Relation Age of Onset   Hypertension Mother    Diabetes Mother    Hypertension Father    Hypertension Sister    Family Psychiatric  History: Unremarkable Social History:  Social History   Substance and Sexual Activity  Alcohol Use Yes   Comment: 4 40oz beer a week     Social History   Substance and Sexual Activity  Drug Use Yes   Types: Cocaine   Comment: cocaine use once a month, last use 6 mos ago    Social History   Socioeconomic History   Marital status: Single    Spouse name: Not on file   Number of children: Not on file   Years of education: Not on file   Highest education level: Not on file  Occupational History   Not on file  Tobacco Use   Smoking status: Some Days    Current packs/day: 0.00    Types: Cigarettes    Last attempt to quit: 12/25/2020    Years since quitting: 2.8   Smokeless tobacco: Never   Tobacco comments:    Smokes a couple cigarettes every now and then  Vaping Use   Vaping status: Never Used  Substance and Sexual Activity   Alcohol use: Yes  Comment: 4 40oz beer a week   Drug use: Yes    Types: Cocaine    Comment: cocaine use once a month, last use 6 mos ago   Sexual activity: Not Currently  Other Topics Concern   Not on file  Social History Narrative   His mother passed at age 52 (when he was 89 years old) and his father raised him and his siblings   His parents were pastors as a lot of his sisters and other family members are today   There was a total of 13 children in his family Had 35 sisters, one sister deceased , Had 3 brothers, all brothers deceased   He is the youngest of the 35 and was "always in trouble in my younger years"   Family still live in [place], [place], some  in [place], [place]   Has a Daughter and son with a total of 10 grandchildren (6 grand daughters local and 4 grandsons)    Values family   Married twice, present wife incarcerated   Social Drivers of Health   Financial Resource Strain: Low Risk  (10/19/2022)   Overall Financial Resource Strain (CARDIA)    Difficulty of Paying Living Expenses: Not hard at all  Food Insecurity: No Food Insecurity (10/11/2023)   Hunger Vital Sign    Worried About Running Out of Food in the Last Year: Never true    Ran Out of Food in the Last Year: Never true  Recent Concern: Food Insecurity - Food Insecurity Present (09/16/2023)   Hunger Vital Sign    Worried About Running Out of Food in the Last Year: Sometimes true    Ran Out of Food in the Last Year: Sometimes true  Transportation Needs: No Transportation Needs (10/11/2023)   PRAPARE - Administrator, Civil Service (Medical): No    Lack of Transportation (Non-Medical): No  Recent Concern: Transportation Needs - Unmet Transportation Needs (10/10/2023)   PRAPARE - Transportation    Lack of Transportation (Medical): Yes    Lack of Transportation (Non-Medical): Yes  Physical Activity: Inactive (10/19/2022)   Exercise Vital Sign    Days of Exercise per Week: 0 days    Minutes of Exercise per Session: 0 min  Stress: No Stress Concern Present (10/19/2022)   [name] of Occupational Health - Occupational Stress Questionnaire    Feeling of Stress : Not at all  Social Connections: Moderately Integrated (03/31/2022)   Social Connection and Isolation Panel [NHANES]    Frequency of Communication with Friends and Family: Three times a week    Frequency of Social Gatherings with Friends and Family: Three times a week    Attends Religious Services: 1 to 4 times per year    Active Member of Clubs or Organizations: Yes    Attends Banker Meetings: 1 to 4 times per year    Marital Status: Never married   Additional Social History:                          Sleep: Good  Appetite:  Good  Current Medications: Current Facility-Administered Medications  Medication Dose Route Frequency Provider Last Rate Last Admin   acetaminophen (TYLENOL) tablet 650 mg  650 mg Oral Q6H PRN Maryagnes Amos, FNP   650 mg at 10/19/23 1139   alum & mag hydroxide-simeth (MAALOX/MYLANTA) 200-200-20 MG/5ML suspension 30 mL  30 mL Oral Q4H PRN Rosario Adie, Juel Burrow, FNP  ARIPiprazole (ABILIFY) tablet 15 mg  15 mg Oral QPC breakfast [name], DO   15 mg at 10/21/23 1007   atorvastatin (LIPITOR) tablet 80 mg  80 mg Oral Daily Maryagnes Amos, FNP   80 mg at 10/21/23 1007   diltiazem (CARDIZEM CD) 24 hr capsule 180 mg  180 mg Oral Daily Lewanda Rife, MD   180 mg at 10/21/23 1008   docusate sodium (COLACE) capsule 100 mg  100 mg Oral BID Lewanda Rife, MD   100 mg at 10/21/23 1008   guaiFENesin (ROBITUSSIN) 100 MG/5ML liquid 5 mL  5 mL Oral Q6H PRN Lewanda Rife, MD   5 mL at 10/21/23 1328   latanoprost (XALATAN) 0.005 % ophthalmic solution 1 drop  1 drop Both Eyes QHS Maryagnes Amos, FNP   1 drop at 10/20/23 2137   magnesium hydroxide (MILK OF MAGNESIA) suspension 30 mL  30 mL Oral Daily PRN Maryagnes Amos, FNP   30 mL at 10/12/23 1718   melatonin tablet 5 mg  5 mg Oral QHS Lewanda Rife, MD   5 mg at 10/20/23 2136   menthol-cetylpyridinium (CEPACOL) lozenge 3 mg  1 lozenge Oral PRN Lewanda Rife, MD   3 mg at 10/17/23 1203   mirtazapine (REMERON) tablet 30 mg  30 mg Oral QHS Lewanda Rife, MD   30 mg at 10/20/23 2135   mometasone-formoterol (DULERA) 200-5 MCG/ACT inhaler 2 puff  2 puff Inhalation BID Maryagnes Amos, FNP   2 puff at 10/21/23 1009   OLANZapine zydis (ZYPREXA) disintegrating tablet 5 mg  5 mg Oral TID PRN Maryagnes Amos, FNP       pantoprazole (PROTONIX) EC tablet 20 mg  20 mg Oral Daily Maryagnes Amos, FNP   20 mg at 10/21/23 1008    rivaroxaban (XARELTO) tablet 10 mg  10 mg Oral Daily Maryagnes Amos, FNP   10 mg at 10/21/23 1008   senna-docusate (Senokot-S) tablet 1 tablet  1 tablet Oral QHS PRN Maryagnes Amos, FNP   1 tablet at 10/13/23 2107    Lab Results: No results found for this or any previous visit (from the past 48 hours).  Blood Alcohol level:  Lab Results  Component Value Date   ETH <10 10/10/2023   ETH <10 09/15/2023    Metabolic Disorder Labs: Lab Results  Component Value Date   HGBA1C 5.1 09/15/2023   MPG 99.67 09/15/2023   MPG 103 01/13/2023   No results found for: "PROLACTIN" Lab Results  Component Value Date   CHOL 159 09/15/2023   TRIG 53 09/15/2023   HDL 61 09/15/2023   CHOLHDL 2.6 09/15/2023   VLDL 11 09/15/2023   LDLCALC 87 09/15/2023   LDLCALC 99 01/13/2023    Physical Findings: AIMS:  , ,  ,  ,    CIWA:    COWS:     Musculoskeletal: Strength & Muscle Tone: within normal limits Gait & Station: normal Patient leans: N/A  Psychiatric Specialty Exam:  Presentation  General Appearance:  Appropriate for Environment  Eye Contact: -- (Staring at the wall when talking, minimal eye contact with the interviewer)  Speech: Normal Rate  Speech Volume: Normal  Handedness: Right   Mood and Affect  Mood: Depressed  Affect: Congruent; Depressed   Thought Process  Thought Processes: Coherent  Descriptions of Associations:Intact  Orientation:Full (Time, Place and Person)  Thought Content:Logical  History of Schizophrenia/Schizoaffective disorder:No  Duration of Psychotic Symptoms:N/A  Hallucinations:No data recorded Ideas  of Reference:None  Suicidal Thoughts:No data recorded Homicidal Thoughts:No data recorded  Sensorium  Memory: Immediate Good; Recent Good; Remote Good  Judgment: Poor  Insight: Fair   Chartered certified accountant: Fair  Attention Span: Fair  Recall: [employer] of  Knowledge: Fair  Language: Fair   Psychomotor Activity  Psychomotor Activity:No data recorded  Assets  Assets: Social Support; Desire for Improvement   Sleep  Sleep:No data recorded   Blood pressure (!) 147/93, pulse 75, temperature (!) 97.5 F (36.4 C), resp. rate 20, height 6\' 4"  (1.93 m), weight 112.5 kg, SpO2 100%. Body mass index is 30.19 kg/m.   Treatment Plan Summary: Daily contact with patient to assess and evaluate symptoms and progress in treatment, Medication management, and Plan continue current medications.  [name], DO 10/21/2023, 2:17 PM

## 2023-10-22 DIAGNOSIS — F332 Major depressive disorder, recurrent severe without psychotic features: Secondary | ICD-10-CM | POA: Diagnosis not present

## 2023-10-22 NOTE — Group Note (Signed)
Date:  10/22/2023 Time:  11:13 AM  Group Topic/Focus:  Self Care:   The focus of this group is to help patients understand the importance of self-care in order to improve or restore emotional, physical, spiritual, interpersonal, and financial health.    Participation Level:  Active  Participation Quality:  Appropriate  Affect:  Appropriate  Cognitive:  Appropriate  Insight: Appropriate  Engagement in Group:  Engaged  Modes of Intervention:  Activity  Additional Comments:    Kessa Fairbairn 10/22/2023, 11:13 AM

## 2023-10-22 NOTE — Progress Notes (Signed)
[facility] MD Progress Note  10/22/2023 1:33 PM Mackey Bolon  MRN:  [id number] Subjective: Robert Chan is seen on rounds.  He is doing better on the higher dose of Abilify.  His been pleasant and cooperative.  Good controls.  Nurses report no issues.  He has no complaints. Principal Problem: MDD (major depressive disorder), recurrent episode, severe (HCC) Diagnosis: Principal Problem:   MDD (major depressive disorder), recurrent episode, severe (HCC) Active Problems:   Cocaine abuse (HCC)  Total Time spent with patient: 15 minutes  Past Psychiatric History: Polysubstance abuse and depression  Past Medical History:  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   Depression    Elevated serum creatinine 08/28/2019   Homelessness 06/19/2020   Homicidal ideations 05/25/2023   Hypertension    Major depressive disorder, recurrent episode, 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/11/2022   Major depressive disorder, single episode, 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/10/2022   MDD (major depressive disorder), recurrent severe, 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   NSTEMI (non-ST elevated myocardial infarction) (HCC)    Schizoaffective disorder (HCC) 11/08/2022   Status post incision and drainage 04/22/2021   Suicidal ideation 06/05/2020   Suicidal ideation 06/05/2020    Past Surgical History:  Procedure Laterality Date   APPENDECTOMY     EYE SURGERY     LEFT HEART CATH AND CORONARY ANGIOGRAPHY N/A 06/05/2020   Procedure: LEFT HEART CATH AND CORONARY ANGIOGRAPHY;  Surgeon: Elder Negus, MD;  Location: [facility] INVASIVE [facility];  Service: Cardiovascular;  Laterality: N/A;   PROSTATE SURGERY     Family History:  Family History  Problem Relation Age of Onset    Hypertension Mother    Diabetes Mother    Hypertension Father    Hypertension Sister    Family Psychiatric  History: Unremarkable Social History:  Social History   Substance and Sexual Activity  Alcohol Use Yes   Comment: 4 40oz beer a week     Social History   Substance and Sexual Activity  Drug Use Yes   Types: Cocaine   Comment: cocaine use once a month, last use 6 mos ago    Social History   Socioeconomic History   Marital status: Single    Spouse name: Not on file   Number of children: Not on file   Years of education: Not on file   Highest education level: Not on file  Occupational History   Not on file  Tobacco Use   Smoking status: Some Days    Current packs/day: 0.00    Types: Cigarettes    Last attempt to quit: 12/25/2020    Years since quitting: 2.8   Smokeless tobacco: Never   Tobacco comments:    Smokes a couple cigarettes every now and then  Vaping Use   Vaping status: Never Used  Substance and Sexual Activity   Alcohol use: Yes    Comment: 4 40oz beer a week   Drug use: Yes    Types: Cocaine    Comment: cocaine use once a month, last use 6 mos ago   Sexual activity: Not Currently  Other Topics Concern   Not on file  Social History Narrative   His mother passed at age 34 (  when he was 20 years old) and his father raised him and his siblings   His parents were pastors as a lot of his sisters and other family members are today   There was a total of 13 children in his family Had 42 sisters, one sister deceased , Had 3 brothers, all brothers deceased   He is the youngest of the 50 and was "always in trouble in my younger years"   Family still live in [place], [place], some in [place], [place]   Has a Daughter and son with a total of 10 grandchildren (6 grand daughters local and 4 grandsons)    Values family   Married twice, present wife incarcerated   Social Drivers of Health   Financial Resource Strain: Low Risk  (10/19/2022)   Overall  Financial Resource Strain (CARDIA)    Difficulty of Paying Living Expenses: Not hard at all  Food Insecurity: No Food Insecurity (10/11/2023)   Hunger Vital Sign    Worried About Running Out of Food in the Last Year: Never true    Ran Out of Food in the Last Year: Never true  Recent Concern: Food Insecurity - Food Insecurity Present (09/16/2023)   Hunger Vital Sign    Worried About Running Out of Food in the Last Year: Sometimes true    Ran Out of Food in the Last Year: Sometimes true  Transportation Needs: No Transportation Needs (10/11/2023)   PRAPARE - Administrator, Civil Service (Medical): No    Lack of Transportation (Non-Medical): No  Recent Concern: Transportation Needs - Unmet Transportation Needs (10/10/2023)   PRAPARE - Transportation    Lack of Transportation (Medical): Yes    Lack of Transportation (Non-Medical): Yes  Physical Activity: Inactive (10/19/2022)   Exercise Vital Sign    Days of Exercise per Week: 0 days    Minutes of Exercise per Session: 0 min  Stress: No Stress Concern Present (10/19/2022)   [name] of Occupational Health - Occupational Stress Questionnaire    Feeling of Stress : Not at all  Social Connections: Moderately Integrated (03/31/2022)   Social Connection and Isolation Panel [NHANES]    Frequency of Communication with Friends and Family: Three times a week    Frequency of Social Gatherings with Friends and Family: Three times a week    Attends Religious Services: 1 to 4 times per year    Active Member of Clubs or Organizations: Yes    Attends Banker Meetings: 1 to 4 times per year    Marital Status: Never married   Additional Social History:                         Sleep: Good  Appetite:  Good  Current Medications: Current Facility-Administered Medications  Medication Dose Route Frequency Provider Last Rate Last Admin   acetaminophen (TYLENOL) tablet 650 mg  650 mg Oral Q6H PRN Maryagnes Amos, FNP   650 mg at 10/19/23 1139   alum & mag hydroxide-simeth (MAALOX/MYLANTA) 200-200-20 MG/5ML suspension 30 mL  30 mL Oral Q4H PRN Maryagnes Amos, FNP       ARIPiprazole (ABILIFY) tablet 15 mg  15 mg Oral QPC breakfast [name], DO   15 mg at 10/22/23 0949   atorvastatin (LIPITOR) tablet 80 mg  80 mg Oral Daily Maryagnes Amos, FNP   80 mg at 10/22/23 0949   diltiazem (CARDIZEM CD) 24 hr capsule 180  mg  180 mg Oral Daily Lewanda Rife, MD   180 mg at 10/22/23 0950   docusate sodium (COLACE) capsule 100 mg  100 mg Oral BID Lewanda Rife, MD   100 mg at 10/21/23 2104   guaiFENesin (ROBITUSSIN) 100 MG/5ML liquid 5 mL  5 mL Oral Q6H PRN Lewanda Rife, MD   5 mL at 10/21/23 1328   latanoprost (XALATAN) 0.005 % ophthalmic solution 1 drop  1 drop Both Eyes QHS Maryagnes Amos, FNP   1 drop at 10/21/23 2105   magnesium hydroxide (MILK OF MAGNESIA) suspension 30 mL  30 mL Oral Daily PRN Maryagnes Amos, FNP   30 mL at 10/12/23 1718   melatonin tablet 5 mg  5 mg Oral QHS Lewanda Rife, MD   5 mg at 10/21/23 2104   menthol-cetylpyridinium (CEPACOL) lozenge 3 mg  1 lozenge Oral PRN Lewanda Rife, MD   3 mg at 10/17/23 1203   mirtazapine (REMERON) tablet 30 mg  30 mg Oral QHS Lewanda Rife, MD   30 mg at 10/21/23 2104   mometasone-formoterol (DULERA) 200-5 MCG/ACT inhaler 2 puff  2 puff Inhalation BID Maryagnes Amos, FNP   2 puff at 10/22/23 0950   OLANZapine zydis (ZYPREXA) disintegrating tablet 5 mg  5 mg Oral TID PRN Maryagnes Amos, FNP       pantoprazole (PROTONIX) EC tablet 20 mg  20 mg Oral Daily Maryagnes Amos, FNP   20 mg at 10/22/23 1610   rivaroxaban (XARELTO) tablet 10 mg  10 mg Oral Daily Maryagnes Amos, FNP   10 mg at 10/22/23 0950   senna-docusate (Senokot-S) tablet 1 tablet  1 tablet Oral QHS PRN Maryagnes Amos, FNP   1 tablet at 10/13/23 2107    Lab Results: No results found for  this or any previous visit (from the past 48 hours).  Blood Alcohol level:  Lab Results  Component Value Date   ETH <10 10/10/2023   ETH <10 09/15/2023    Metabolic Disorder Labs: Lab Results  Component Value Date   HGBA1C 5.1 09/15/2023   MPG 99.67 09/15/2023   MPG 103 01/13/2023   No results found for: "PROLACTIN" Lab Results  Component Value Date   CHOL 159 09/15/2023   TRIG 53 09/15/2023   HDL 61 09/15/2023   CHOLHDL 2.6 09/15/2023   VLDL 11 09/15/2023   LDLCALC 87 09/15/2023   LDLCALC 99 01/13/2023    Physical Findings: AIMS:  , ,  ,  ,    CIWA:    COWS:     Musculoskeletal: Strength & Muscle Tone: within normal limits Gait & Station: normal Patient leans: N/A  Psychiatric Specialty Exam:  Presentation  General Appearance:  Appropriate for Environment  Eye Contact: -- (Staring at the wall when talking, minimal eye contact with the interviewer)  Speech: Normal Rate  Speech Volume: Normal  Handedness: Right   Mood and Affect  Mood: Depressed  Affect: Congruent; Depressed   Thought Process  Thought Processes: Coherent  Descriptions of Associations:Intact  Orientation:Full (Time, Place and Person)  Thought Content:Logical  History of Schizophrenia/Schizoaffective disorder:No  Duration of Psychotic Symptoms:N/A  Hallucinations:No data recorded Ideas of Reference:None  Suicidal Thoughts:No data recorded Homicidal Thoughts:No data recorded  Sensorium  Memory: Immediate Good; Recent Good; Remote Good  Judgment: Poor  Insight: Fair   Chartered certified accountant: Fair  Attention Span: Fair  Recall: [employer] of Knowledge: Fair  Language: Fair   Psychomotor Activity  Psychomotor Activity:No data recorded  Assets  Assets: Social Support; Desire for Improvement   Sleep  Sleep:No data recorded    Blood pressure (!) 142/80, pulse 68, temperature (!) 97.4 F (36.3 C), resp. rate (!) 24, height  6\' 4"  (1.93 m), weight 112.5 kg, SpO2 100%. Body mass index is 30.19 kg/m.   Treatment Plan Summary: Daily contact with patient to assess and evaluate symptoms and progress in treatment, Medication management, and Plan continue current medications.  Ovila Lepage Tresea Mall, DO 10/22/2023, 1:33 PM

## 2023-10-22 NOTE — Group Note (Signed)
Date:  10/22/2023 Time:  8:48 PM  Group Topic/Focus:  Healthy Communication:   The focus of this group is to discuss communication, barriers to communication, as well as healthy ways to communicate with others.    Participation Level:  Active  Participation Quality:  Appropriate  Affect:  Appropriate  Cognitive:  Appropriate  Insight: Good  Engagement in Group:  Engaged  Modes of Intervention:  Discussion  Additional Comments:    Burt Ek 10/22/2023, 8:48 PM

## 2023-10-22 NOTE — Plan of Care (Signed)
  Problem: Education: Goal: Knowledge of General Education information will improve Description: Including pain rating scale, medication(s)/side effects and non-pharmacologic comfort measures Outcome: Progressing   Problem: Health Behavior/Discharge Planning: Goal: Ability to manage health-related needs will improve Outcome: Progressing   Problem: Clinical Measurements: Goal: Ability to maintain clinical measurements within normal limits will improve Outcome: Progressing Goal: Will remain free from infection Outcome: Progressing Goal: Diagnostic test results will improve Outcome: Progressing Goal: Respiratory complications will improve Outcome: Progressing Goal: Cardiovascular complication will be avoided Outcome: Progressing   Problem: Activity: Goal: Risk for activity intolerance will decrease Outcome: Progressing   Problem: Nutrition: Goal: Adequate nutrition will be maintained Outcome: Progressing   Problem: Coping: Goal: Level of anxiety will decrease Outcome: Progressing   Problem: Elimination: Goal: Will not experience complications related to bowel motility Outcome: Progressing Goal: Will not experience complications related to urinary retention Outcome: Progressing   Problem: Pain Management: Goal: General experience of comfort will improve Outcome: Progressing   Problem: Safety: Goal: Ability to remain free from injury will improve Outcome: Progressing   Problem: Skin Integrity: Goal: Risk for impaired skin integrity will decrease Outcome: Progressing   Problem: Education: Goal: Utilization of techniques to improve thought processes will improve Outcome: Progressing

## 2023-10-22 NOTE — BH IP Treatment Plan (Signed)
Interdisciplinary Treatment and Diagnostic Plan Update  10/22/2023 Time of Session: 10:16 am Robert Chan MRN: [id number]  Principal Diagnosis: MDD (major depressive disorder), recurrent episode, severe (HCC)  Secondary Diagnoses: Principal Problem:   MDD (major depressive disorder), recurrent episode, severe (HCC) Active Problems:   Cocaine abuse (HCC)   Current Medications:  Current Facility-Administered Medications  Medication Dose Route Frequency Provider Last Rate Last Admin   acetaminophen (TYLENOL) tablet 650 mg  650 mg Oral Q6H PRN Maryagnes Amos, FNP   650 mg at 10/19/23 1139   alum & mag hydroxide-simeth (MAALOX/MYLANTA) 200-200-20 MG/5ML suspension 30 mL  30 mL Oral Q4H PRN Maryagnes Amos, FNP       ARIPiprazole (ABILIFY) tablet 15 mg  15 mg Oral QPC breakfast [name], DO   15 mg at 10/22/23 0949   atorvastatin (LIPITOR) tablet 80 mg  80 mg Oral Daily Maryagnes Amos, FNP   80 mg at 10/22/23 0949   diltiazem (CARDIZEM CD) 24 hr capsule 180 mg  180 mg Oral Daily Lewanda Rife, MD   180 mg at 10/22/23 0950   docusate sodium (COLACE) capsule 100 mg  100 mg Oral BID Lewanda Rife, MD   100 mg at 10/21/23 2104   guaiFENesin (ROBITUSSIN) 100 MG/5ML liquid 5 mL  5 mL Oral Q6H PRN Lewanda Rife, MD   5 mL at 10/21/23 1328   latanoprost (XALATAN) 0.005 % ophthalmic solution 1 drop  1 drop Both Eyes QHS Maryagnes Amos, FNP   1 drop at 10/21/23 2105   magnesium hydroxide (MILK OF MAGNESIA) suspension 30 mL  30 mL Oral Daily PRN Maryagnes Amos, FNP   30 mL at 10/12/23 1718   melatonin tablet 5 mg  5 mg Oral QHS Lewanda Rife, MD   5 mg at 10/21/23 2104   menthol-cetylpyridinium (CEPACOL) lozenge 3 mg  1 lozenge Oral PRN Lewanda Rife, MD   3 mg at 10/17/23 1203   mirtazapine (REMERON) tablet 30 mg  30 mg Oral QHS Lewanda Rife, MD   30 mg at 10/21/23 2104   mometasone-formoterol (DULERA) 200-5 MCG/ACT  inhaler 2 puff  2 puff Inhalation BID Maryagnes Amos, FNP   2 puff at 10/22/23 0950   OLANZapine zydis (ZYPREXA) disintegrating tablet 5 mg  5 mg Oral TID PRN Maryagnes Amos, FNP       pantoprazole (PROTONIX) EC tablet 20 mg  20 mg Oral Daily Maryagnes Amos, FNP   20 mg at 10/22/23 7829   rivaroxaban (XARELTO) tablet 10 mg  10 mg Oral Daily Maryagnes Amos, FNP   10 mg at 10/22/23 0950   senna-docusate (Senokot-S) tablet 1 tablet  1 tablet Oral QHS PRN Maryagnes Amos, FNP   1 tablet at 10/13/23 2107   PTA Medications: Medications Prior to Admission  Medication Sig Dispense Refill Last Dose/Taking   albuterol (VENTOLIN HFA) 108 (90 Base) MCG/ACT inhaler Inhale 2 puffs into the lungs every 6 (six) hours as needed for shortness of breath. 18 g 0    ARIPiprazole (ABILIFY) 5 MG tablet Take 1 tablet (5 mg total) by mouth daily. (Patient not taking: Reported on 10/10/2023) 30 tablet 0    atorvastatin (LIPITOR) 80 MG tablet Take 1 tablet (80 mg total) by mouth daily. (Patient not taking: Reported on 10/10/2023) 30 tablet 0    diltiazem (CARDIZEM CD) 180 MG 24 hr capsule Take 1 capsule (180 mg total) by mouth daily. (Patient not taking: Reported on 10/10/2023) 30  capsule 0    latanoprost (XALATAN) 0.005 % ophthalmic solution Place 1 drop into both eyes at bedtime. (Patient not taking: Reported on 10/10/2023) 2.5 mL 12    lidocaine (LIDODERM) 5 % Place 1 patch onto the skin daily. Remove & Discard patch within 12 hours or as directed by MD      mometasone-formoterol (DULERA) 200-5 MCG/ACT AERO Inhale 2 puffs into the lungs 2 (two) times daily. (Patient not taking: Reported on 10/10/2023) 1 each 1    traZODone (DESYREL) 50 MG tablet Take 1 tablet (50 mg total) by mouth at bedtime as needed for sleep. (Patient not taking: Reported on 10/10/2023) 30 tablet 0     Patient Stressors: Marital or family conflict   Medication change or noncompliance    Patient Strengths: Ability  for insight  Forensic psychologist fund of knowledge  Motivation for treatment/growth   Treatment Modalities: Medication Management, Group therapy, Case management,  1 to 1 session with clinician, Psychoeducation, Recreational therapy.   Physician Treatment Plan for Primary Diagnosis: MDD (major depressive disorder), recurrent episode, severe (HCC) Long Term Goal(s): Improvement in symptoms so as ready for discharge   Short Term Goals: Ability to identify changes in lifestyle to reduce recurrence of condition will improve Ability to verbalize feelings will improve Ability to disclose and discuss suicidal ideas Ability to demonstrate self-control will improve Ability to identify and develop effective coping behaviors will improve Ability to maintain clinical measurements within normal limits will improve Compliance with prescribed medications will improve Ability to identify triggers associated with substance abuse/mental health issues will improve  Medication Management: Evaluate patient's response, side effects, and tolerance of medication regimen.  Therapeutic Interventions: 1 to 1 sessions, Unit Group sessions and Medication administration.  Evaluation of Outcomes: Progressing  Physician Treatment Plan for Secondary Diagnosis: Principal Problem:   MDD (major depressive disorder), recurrent episode, severe (HCC) Active Problems:   Cocaine abuse (HCC)  Long Term Goal(s): Improvement in symptoms so as ready for discharge   Short Term Goals: Ability to identify changes in lifestyle to reduce recurrence of condition will improve Ability to verbalize feelings will improve Ability to disclose and discuss suicidal ideas Ability to demonstrate self-control will improve Ability to identify and develop effective coping behaviors will improve Ability to maintain clinical measurements within normal limits will improve Compliance with prescribed medications will improve Ability  to identify triggers associated with substance abuse/mental health issues will improve     Medication Management: Evaluate patient's response, side effects, and tolerance of medication regimen.  Therapeutic Interventions: 1 to 1 sessions, Unit Group sessions and Medication administration.  Evaluation of Outcomes: Progressing   RN Treatment Plan for Primary Diagnosis: MDD (major depressive disorder), recurrent episode, severe (HCC) Long Term Goal(s): Knowledge of disease and therapeutic regimen to maintain health will improve  Short Term Goals: Ability to remain free from injury will improve, Ability to verbalize frustration and anger appropriately will improve, Ability to demonstrate self-control, Ability to participate in decision making will improve, Ability to verbalize feelings will improve, Ability to disclose and discuss suicidal ideas, Ability to identify and develop effective coping behaviors will improve, and Compliance with prescribed medications will improve  Medication Management: RN will administer medications as ordered by provider, will assess and evaluate patient's response and provide education to patient for prescribed medication. RN will report any adverse and/or side effects to prescribing provider.  Therapeutic Interventions: 1 on 1 counseling sessions, Psychoeducation, Medication administration, Evaluate responses to treatment, Monitor vital signs and  CBGs as ordered, Perform/monitor CIWA, COWS, AIMS and Fall Risk screenings as ordered, Perform wound care treatments as ordered.  Evaluation of Outcomes: Progressing   LCSW Treatment Plan for Primary Diagnosis: MDD (major depressive disorder), recurrent episode, severe (HCC) Long Term Goal(s): Safe transition to appropriate next level of care at discharge, Engage patient in therapeutic group addressing interpersonal concerns.  Short Term Goals: Engage patient in aftercare planning with referrals and resources, Increase  social support, Increase ability to appropriately verbalize feelings, Increase emotional regulation, Facilitate acceptance of mental health diagnosis and concerns, Facilitate patient progression through stages of change regarding substance use diagnoses and concerns, and Identify triggers associated with mental health/substance abuse issues  Therapeutic Interventions: Assess for all discharge needs, 1 to 1 time with Social worker, Explore available resources and support systems, Assess for adequacy in community support network, Educate family and significant other(s) on suicide prevention, Complete Psychosocial Assessment, Interpersonal group therapy.  Evaluation of Outcomes: Progressing   Progress in Treatment: Attending groups: Yes. Participating in groups: Yes. Taking medication as prescribed: Yes. Toleration medication: Yes. Family/Significant other contact made: No, will contact:  once patient provides consent. Patient understands diagnosis: No. Discussing patient identified problems/goals with staff: Yes. Medical problems stabilized or resolved: Yes. Denies suicidal/homicidal ideation: Yes. Issues/concerns per patient self-inventory: No. Other: None  New problem(s) identified: No, Describe:  none Update 10/17/23: No changes at this time Update 10/22/23: No changes at this time    New Short Term/Long Term Goal(s): detox, elimination of symptoms of psychosis, medication management for mood stabilization; elimination of SI thoughts; development of comprehensive mental wellness/sobriety plan.  Update 10/17/23: No changes at this time Update 10/22/23: No changes at this time    Patient Goals:  "not come back" Update 10/17/23: No changes at this time Update 10/22/23: No changes at this time    Discharge Plan or Barriers: Patient reports plans to return to his home at discharge. He reports plans to begin aftercare treatment once discharged.  Update 10/17/23: No changes at this time Update  10/22/23: No changes at this time    Reason for Continuation of Hospitalization: Anxiety Depression Homicidal ideation Medication stabilization Suicidal ideation   Estimated Length of Stay:  1-7 days Update 10/17/23: TBD Update 10/22/23: TBD   Last 3 [place] Suicide Severity Risk Score: Flowsheet Row Admission (Current) from 10/11/2023 in [facility] ED to Hosp-Admission (Discharged) from 10/10/2023 in [facility] ED from 09/16/2023 in [facility]  C-SSRS RISK CATEGORY No Risk High Risk No Risk       Last PHQ 2/9 Scores:    09/21/2023    2:39 PM 09/21/2023    6:56 AM 09/16/2023    2:19 PM  Depression screen PHQ 2/9  Decreased Interest 1 0 0  Down, Depressed, Hopeless 0 0 1  PHQ - 2 Score 1 0 1    Scribe for Treatment Team: Marshell Levan, LCSW 10/22/2023 10:16 AM

## 2023-10-22 NOTE — Plan of Care (Signed)

## 2023-10-22 NOTE — Plan of Care (Signed)

## 2023-10-22 NOTE — Progress Notes (Signed)
 Patient pleasant and cooperative. Endorses anxiety.  Denies SI/HI and AVH.  Denies depression.  Denis pain.  Reports he slept well.    Compliant with scheduled medications.  PRN medication given for cough. 15 min checks in place for safety. Patient is present in the milieu.  Appropriate integration with peers and staff.

## 2023-10-23 DIAGNOSIS — F332 Major depressive disorder, recurrent severe without psychotic features: Secondary | ICD-10-CM | POA: Diagnosis not present

## 2023-10-23 NOTE — Plan of Care (Signed)
  Problem: Education: Goal: Knowledge of General Education information will improve Description: Including pain rating scale, medication(s)/side effects and non-pharmacologic comfort measures Outcome: Progressing   Problem: Clinical Measurements: Goal: Will remain free from infection Outcome: Progressing   Problem: Activity: Goal: Risk for activity intolerance will decrease Outcome: Progressing   

## 2023-10-23 NOTE — Group Note (Signed)
[facility] LCSW Group Therapy Note   Group Date: 10/23/2023 Start Time: 1320 End Time: 1400   Type of Therapy/Topic:  Group Therapy:  Balance in Life  Participation Level:  Active   Description of Group:    This group will address the concept of balance and how it feels and looks when one is unbalanced. Patients will be encouraged to process areas in their lives that are out of balance, and identify reasons for remaining unbalanced. Facilitators will guide patients utilizing problem- solving interventions to address and correct the stressor making their life unbalanced. Understanding and applying boundaries will be explored and addressed for obtaining  and maintaining a balanced life. Patients will be encouraged to explore ways to assertively make their unbalanced needs known to significant others in their lives, using other group members and facilitator for support and feedback.  Therapeutic Goals: Patient will identify two or more emotions or situations they have that consume much of in their lives. Patient will identify signs/triggers that life has become out of balance:  Patient will identify two ways to set boundaries in order to achieve balance in their lives:  Patient will demonstrate ability to communicate their needs through discussion and/or role plays  Summary of Patient Progress:     Patient was alert and active in group. Patient was able to describe the impact that feelings have on behaviors.     Therapeutic Modalities:   Cognitive Behavioral Therapy Solution-Focused Therapy Assertiveness Training   Whitney Post, [address]

## 2023-10-23 NOTE — Progress Notes (Signed)
 Patient pleasant and cooperative. Appears sleepy.  Flat affect.  Endorses anxiety and depression.  Denies SI/HI and AVH.   Denies pain.   Compliant with scheduled medications.  PRN medications given for cough and sore throat as ordered.  15 min checks in place for safety.  Patient is present in the milieu.  Appropriate interaction with peers and staff.

## 2023-10-23 NOTE — Progress Notes (Signed)
[facility] MD Progress Note  10/23/2023 1:11 PM Robert Chan  MRN:  [id number] Subjective: Gains is seen on rounds.  He is doing better on the 15 mg of Abilify.  Nurses report no issues.  He has been pleasant and cooperative.  He denies any side effects from his medicine.  He still does not know whether he has a place to go over the holidays. Principal Problem: MDD (major depressive disorder), recurrent episode, severe (HCC) Diagnosis: Principal Problem:   MDD (major depressive disorder), recurrent episode, severe (HCC) Active Problems:   Cocaine abuse (HCC)  Total Time spent with patient: 15 minutes  Past Psychiatric History: Depression and polysubstance abuse  Past Medical History:  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   Depression    Elevated serum creatinine 08/28/2019   Homelessness 06/19/2020   Homicidal ideations 05/25/2023   Hypertension    Major depressive disorder, recurrent episode, 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/11/2022   Major depressive disorder, single episode, 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/10/2022   MDD (major depressive disorder), recurrent severe, 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   NSTEMI (non-ST elevated myocardial infarction) (HCC)    Schizoaffective disorder (HCC) 11/08/2022   Status post incision and drainage 04/22/2021   Suicidal ideation 06/05/2020   Suicidal ideation 06/05/2020    Past Surgical History:  Procedure Laterality Date   APPENDECTOMY     EYE SURGERY     LEFT HEART CATH AND CORONARY ANGIOGRAPHY N/A 06/05/2020   Procedure: LEFT HEART CATH AND CORONARY ANGIOGRAPHY;  Surgeon: Elder Negus, MD;  Location: [facility] INVASIVE CV LAB;  Service: Cardiovascular;  Laterality: N/A;   PROSTATE  SURGERY     Family History:  Family History  Problem Relation Age of Onset   Hypertension Mother    Diabetes Mother    Hypertension Father    Hypertension Sister    Family Psychiatric  History: Unremarkable Social History:  Social History   Substance and Sexual Activity  Alcohol Use Yes   Comment: 4 40oz beer a week     Social History   Substance and Sexual Activity  Drug Use Yes   Types: Cocaine   Comment: cocaine use once a month, last use 6 mos ago    Social History   Socioeconomic History   Marital status: Single    Spouse name: Not on file   Number of children: Not on file   Years of education: Not on file   Highest education level: Not on file  Occupational History   Not on file  Tobacco Use   Smoking status: Some Days    Current packs/day: 0.00    Types: Cigarettes    Last attempt to quit: 12/25/2020    Years since quitting: 2.8   Smokeless tobacco: Never   Tobacco comments:    Smokes a couple cigarettes every now and then  Vaping Use   Vaping status: Never Used  Substance and Sexual Activity   Alcohol use: Yes    Comment: 4 40oz beer a week   Drug use: Yes    Types: Cocaine    Comment: cocaine use once a month, last use 6 mos ago   Sexual activity: Not Currently  Other Topics  Concern   Not on file  Social History Narrative   His mother passed at age 38 (when he was 57 years old) and his father raised him and his siblings   His parents were pastors as a lot of his sisters and other family members are today   There was a total of 13 children in his family Had 82 sisters, one sister deceased , Had 3 brothers, all brothers deceased   He is the youngest of the 28 and was "always in trouble in my younger years"   Family still live in [place], [place], some in [place], [place]   Has a Daughter and son with a total of 10 grandchildren (6 grand daughters local and 4 grandsons)    Values family   Married twice, present wife incarcerated   Social Drivers  of Health   Financial Resource Strain: Low Risk  (10/19/2022)   Overall Financial Resource Strain (CARDIA)    Difficulty of Paying Living Expenses: Not hard at all  Food Insecurity: No Food Insecurity (10/11/2023)   Hunger Vital Sign    Worried About Running Out of Food in the Last Year: Never true    Ran Out of Food in the Last Year: Never true  Recent Concern: Food Insecurity - Food Insecurity Present (09/16/2023)   Hunger Vital Sign    Worried About Running Out of Food in the Last Year: Sometimes true    Ran Out of Food in the Last Year: Sometimes true  Transportation Needs: No Transportation Needs (10/11/2023)   PRAPARE - Administrator, Civil Service (Medical): No    Lack of Transportation (Non-Medical): No  Recent Concern: Transportation Needs - Unmet Transportation Needs (10/10/2023)   PRAPARE - Transportation    Lack of Transportation (Medical): Yes    Lack of Transportation (Non-Medical): Yes  Physical Activity: Inactive (10/19/2022)   Exercise Vital Sign    Days of Exercise per Week: 0 days    Minutes of Exercise per Session: 0 min  Stress: No Stress Concern Present (10/19/2022)   [name] of Occupational Health - Occupational Stress Questionnaire    Feeling of Stress : Not at all  Social Connections: Moderately Integrated (03/31/2022)   Social Connection and Isolation Panel [NHANES]    Frequency of Communication with Friends and Family: Three times a week    Frequency of Social Gatherings with Friends and Family: Three times a week    Attends Religious Services: 1 to 4 times per year    Active Member of Clubs or Organizations: Yes    Attends Banker Meetings: 1 to 4 times per year    Marital Status: Never married   Additional Social History:                         Sleep: Good  Appetite:  Good  Current Medications: Current Facility-Administered Medications  Medication Dose Route Frequency Provider Last Rate Last Admin    acetaminophen (TYLENOL) tablet 650 mg  650 mg Oral Q6H PRN Maryagnes Amos, FNP   650 mg at 10/19/23 1139   alum & mag hydroxide-simeth (MAALOX/MYLANTA) 200-200-20 MG/5ML suspension 30 mL  30 mL Oral Q4H PRN Maryagnes Amos, FNP       ARIPiprazole (ABILIFY) tablet 15 mg  15 mg Oral QPC breakfast [name], DO   15 mg at 10/23/23 0841   atorvastatin (LIPITOR) tablet 80 mg  80 mg Oral Daily Caryn Bee  S, FNP   80 mg at 10/23/23 0842   diltiazem (CARDIZEM CD) 24 hr capsule 180 mg  180 mg Oral Daily Lewanda Rife, MD   180 mg at 10/23/23 0841   docusate sodium (COLACE) capsule 100 mg  100 mg Oral BID Lewanda Rife, MD   100 mg at 10/23/23 0842   guaiFENesin (ROBITUSSIN) 100 MG/5ML liquid 5 mL  5 mL Oral Q6H PRN Lewanda Rife, MD   5 mL at 10/23/23 1303   latanoprost (XALATAN) 0.005 % ophthalmic solution 1 drop  1 drop Both Eyes QHS Maryagnes Amos, FNP   1 drop at 10/22/23 2157   magnesium hydroxide (MILK OF MAGNESIA) suspension 30 mL  30 mL Oral Daily PRN Maryagnes Amos, FNP   30 mL at 10/12/23 1718   melatonin tablet 5 mg  5 mg Oral QHS Lewanda Rife, MD   5 mg at 10/22/23 2158   menthol-cetylpyridinium (CEPACOL) lozenge 3 mg  1 lozenge Oral PRN Lewanda Rife, MD   3 mg at 10/23/23 1303   mirtazapine (REMERON) tablet 30 mg  30 mg Oral QHS Lewanda Rife, MD   30 mg at 10/22/23 2158   mometasone-formoterol (DULERA) 200-5 MCG/ACT inhaler 2 puff  2 puff Inhalation BID Maryagnes Amos, FNP   2 puff at 10/23/23 0842   OLANZapine zydis (ZYPREXA) disintegrating tablet 5 mg  5 mg Oral TID PRN Maryagnes Amos, FNP       pantoprazole (PROTONIX) EC tablet 20 mg  20 mg Oral Daily Maryagnes Amos, FNP   20 mg at 10/23/23 4098   rivaroxaban (XARELTO) tablet 10 mg  10 mg Oral Daily Maryagnes Amos, FNP   10 mg at 10/23/23 0841   senna-docusate (Senokot-S) tablet 1 tablet  1 tablet Oral QHS PRN Maryagnes Amos, FNP   1 tablet at 10/13/23 2107    Lab Results: No results found for this or any previous visit (from the past 48 hours).  Blood Alcohol level:  Lab Results  Component Value Date   ETH <10 10/10/2023   ETH <10 09/15/2023    Metabolic Disorder Labs: Lab Results  Component Value Date   HGBA1C 5.1 09/15/2023   MPG 99.67 09/15/2023   MPG 103 01/13/2023   No results found for: "PROLACTIN" Lab Results  Component Value Date   CHOL 159 09/15/2023   TRIG 53 09/15/2023   HDL 61 09/15/2023   CHOLHDL 2.6 09/15/2023   VLDL 11 09/15/2023   LDLCALC 87 09/15/2023   LDLCALC 99 01/13/2023    Physical Findings: AIMS:  , ,  ,  ,    CIWA:    COWS:     Musculoskeletal: Strength & Muscle Tone: within normal limits Gait & Station: normal Patient leans: N/A  Psychiatric Specialty Exam:  Presentation  General Appearance:  Appropriate for Environment  Eye Contact: -- (Staring at the wall when talking, minimal eye contact with the interviewer)  Speech: Normal Rate  Speech Volume: Normal  Handedness: Right   Mood and Affect  Mood: Depressed  Affect: Congruent; Depressed   Thought Process  Thought Processes: Coherent  Descriptions of Associations:Intact  Orientation:Full (Time, Place and Person)  Thought Content:Logical  History of Schizophrenia/Schizoaffective disorder:No  Duration of Psychotic Symptoms:N/A  Hallucinations:No data recorded Ideas of Reference:None  Suicidal Thoughts:No data recorded Homicidal Thoughts:No data recorded  Sensorium  Memory: Immediate Good; Recent Good; Remote Good  Judgment: Poor  Insight: Fair   Art therapist  Concentration: Fair  Attention  Span: Fair  Recall: [employer] of Knowledge: Fair  Language: Fair   Psychomotor Activity  Psychomotor Activity:No data recorded  Assets  Assets: Social Support; Desire for Improvement   Sleep  Sleep:No data recorded    Blood pressure  (!) 145/82, pulse 72, temperature 98.2 F (36.8 C), resp. rate 18, height 6\' 4"  (1.93 m), weight 112.5 kg, SpO2 99%. Body mass index is 30.19 kg/m.   Treatment Plan Summary: Daily contact with patient to assess and evaluate symptoms and progress in treatment, Medication management, and Plan continue current medications.  [name], DO 10/23/2023, 1:11 PM

## 2023-10-23 NOTE — Progress Notes (Signed)
   10/22/23 2200  Psych Admission Type (Psych Patients Only)  Admission Status Voluntary  Psychosocial Assessment  Patient Complaints None  Eye Contact Fair  Facial Expression Flat  Affect Appropriate to circumstance  Speech Logical/coherent  Interaction Assertive  Motor Activity Slow  Appearance/Hygiene Unremarkable  Behavior Characteristics Cooperative  Mood Pleasant  Thought Process  Coherency WDL  Content WDL  Delusions None reported or observed  Perception WDL  Hallucination None reported or observed  Judgment Impaired  Confusion None  Danger to Self  Current suicidal ideation? Denies  Agreement Not to Harm Self Yes  Description of Agreement verbal  Danger to Others  Danger to Others None reported or observed   Pt alert and oriented x4 this shift. Currently sleeping in his room with no acute distress. Denies SI/HI/AVH. Contracts for safety on unit.

## 2023-10-23 NOTE — Progress Notes (Signed)
   10/23/23 0600  15 Minute Checks  Location Bedroom  Visual Appearance Calm  Behavior Sleeping  Sleep (Behavioral Health Patients Only)  Calculate sleep? (Click Yes once per 24 hr at 0600 safety check) Yes  Documented sleep last 24 hours 7.75

## 2023-10-24 DIAGNOSIS — F332 Major depressive disorder, recurrent severe without psychotic features: Secondary | ICD-10-CM | POA: Diagnosis not present

## 2023-10-24 NOTE — Progress Notes (Signed)
[facility] MD Progress Note  10/24/2023 1:16 PM Robert Chan  MRN:  [id number] Subjective: Robert Chan is seen on rounds.  He states that he is doing pretty well on his current medications.  He states that his family is left for the holidays and he has no place to go.  He is afraid that he will start using cocaine and become suicidal if he is homeless.  Nurses report no issues.  He has been in good controls.  No complaints. Principal Problem: MDD (major depressive disorder), recurrent episode, severe (HCC) Diagnosis: Principal Problem:   MDD (major depressive disorder), recurrent episode, severe (HCC) Active Problems:   Cocaine abuse (HCC)  Total Time spent with patient: 15 minutes  Past Psychiatric History: Polysubstance abuse and depression  Past Medical History:  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   Depression    Elevated serum creatinine 08/28/2019   Homelessness 06/19/2020   Homicidal ideations 05/25/2023   Hypertension    Major depressive disorder, recurrent episode, 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/11/2022   Major depressive disorder, single episode, 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/10/2022   MDD (major depressive disorder), recurrent severe, 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   NSTEMI (non-ST elevated myocardial infarction) (HCC)    Schizoaffective disorder (HCC) 11/08/2022   Status post incision and drainage 04/22/2021   Suicidal ideation 06/05/2020   Suicidal ideation 06/05/2020    Past Surgical History:  Procedure Laterality Date   APPENDECTOMY     EYE SURGERY     LEFT HEART CATH AND CORONARY ANGIOGRAPHY N/A 06/05/2020   Procedure: LEFT HEART CATH AND CORONARY ANGIOGRAPHY;  Surgeon: Elder Negus, MD;  Location: [facility]  INVASIVE CV LAB;  Service: Cardiovascular;  Laterality: N/A;   PROSTATE SURGERY     Family History:  Family History  Problem Relation Age of Onset   Hypertension Mother    Diabetes Mother    Hypertension Father    Hypertension Sister    Family Psychiatric  History: Unremarkable Social History:  Social History   Substance and Sexual Activity  Alcohol Use Yes   Comment: 4 40oz beer a week     Social History   Substance and Sexual Activity  Drug Use Yes   Types: Cocaine   Comment: cocaine use once a month, last use 6 mos ago    Social History   Socioeconomic History   Marital status: Single    Spouse name: Not on file   Number of children: Not on file   Years of education: Not on file   Highest education level: Not on file  Occupational History   Not on file  Tobacco Use   Smoking status: Some Days    Current packs/day: 0.00    Types: Cigarettes    Last attempt to quit: 12/25/2020    Years since quitting: 2.8   Smokeless tobacco: Never   Tobacco comments:    Smokes a couple cigarettes every now and then  Vaping Use   Vaping status: Never Used  Substance and Sexual Activity   Alcohol use: Yes    Comment: 4 40oz beer a week   Drug use: Yes    Types: Cocaine    Comment: cocaine use once a  month, last use 6 mos ago   Sexual activity: Not Currently  Other Topics Concern   Not on file  Social History Narrative   His mother passed at age 4 (when he was 33 years old) and his father raised him and his siblings   His parents were pastors as a lot of his sisters and other family members are today   There was a total of 13 children in his family Had 20 sisters, one sister deceased , Had 3 brothers, all brothers deceased   He is the youngest of the 52 and was "always in trouble in my younger years"   Family still live in [place], [place], some in [place], [place]   Has a Daughter and son with a total of 10 grandchildren (6 grand daughters local and 4 grandsons)     Values family   Married twice, present wife incarcerated   Social Drivers of Health   Financial Resource Strain: Low Risk  (10/19/2022)   Overall Financial Resource Strain (CARDIA)    Difficulty of Paying Living Expenses: Not hard at all  Food Insecurity: No Food Insecurity (10/11/2023)   Hunger Vital Sign    Worried About Running Out of Food in the Last Year: Never true    Ran Out of Food in the Last Year: Never true  Recent Concern: Food Insecurity - Food Insecurity Present (09/16/2023)   Hunger Vital Sign    Worried About Running Out of Food in the Last Year: Sometimes true    Ran Out of Food in the Last Year: Sometimes true  Transportation Needs: No Transportation Needs (10/11/2023)   PRAPARE - Administrator, Civil Service (Medical): No    Lack of Transportation (Non-Medical): No  Recent Concern: Transportation Needs - Unmet Transportation Needs (10/10/2023)   PRAPARE - Transportation    Lack of Transportation (Medical): Yes    Lack of Transportation (Non-Medical): Yes  Physical Activity: Inactive (10/19/2022)   Exercise Vital Sign    Days of Exercise per Week: 0 days    Minutes of Exercise per Session: 0 min  Stress: No Stress Concern Present (10/19/2022)   [name] of Occupational Health - Occupational Stress Questionnaire    Feeling of Stress : Not at all  Social Connections: Moderately Integrated (03/31/2022)   Social Connection and Isolation Panel [NHANES]    Frequency of Communication with Friends and Family: Three times a week    Frequency of Social Gatherings with Friends and Family: Three times a week    Attends Religious Services: 1 to 4 times per year    Active Member of Clubs or Organizations: Yes    Attends Banker Meetings: 1 to 4 times per year    Marital Status: Never married   Additional Social History:                         Sleep: Good  Appetite:  Good  Current Medications: Current Facility-Administered  Medications  Medication Dose Route Frequency Provider Last Rate Last Admin   acetaminophen (TYLENOL) tablet 650 mg  650 mg Oral Q6H PRN Maryagnes Amos, FNP   650 mg at 10/19/23 1139   alum & mag hydroxide-simeth (MAALOX/MYLANTA) 200-200-20 MG/5ML suspension 30 mL  30 mL Oral Q4H PRN Maryagnes Amos, FNP       ARIPiprazole (ABILIFY) tablet 15 mg  15 mg Oral QPC breakfast [name], DO   15 mg at 10/24/23  0920   atorvastatin (LIPITOR) tablet 80 mg  80 mg Oral Daily Maryagnes Amos, FNP   80 mg at 10/24/23 0920   diltiazem (CARDIZEM CD) 24 hr capsule 180 mg  180 mg Oral Daily Lewanda Rife, MD   180 mg at 10/24/23 0920   docusate sodium (COLACE) capsule 100 mg  100 mg Oral BID Lewanda Rife, MD   100 mg at 10/24/23 0920   guaiFENesin (ROBITUSSIN) 100 MG/5ML liquid 5 mL  5 mL Oral Q6H PRN Lewanda Rife, MD   5 mL at 10/23/23 1303   latanoprost (XALATAN) 0.005 % ophthalmic solution 1 drop  1 drop Both Eyes QHS Maryagnes Amos, FNP   1 drop at 10/23/23 2243   magnesium hydroxide (MILK OF MAGNESIA) suspension 30 mL  30 mL Oral Daily PRN Maryagnes Amos, FNP   30 mL at 10/12/23 1718   melatonin tablet 5 mg  5 mg Oral QHS Lewanda Rife, MD   5 mg at 10/23/23 2132   menthol-cetylpyridinium (CEPACOL) lozenge 3 mg  1 lozenge Oral PRN Lewanda Rife, MD   3 mg at 10/23/23 1303   mirtazapine (REMERON) tablet 30 mg  30 mg Oral QHS Lewanda Rife, MD   30 mg at 10/23/23 2132   mometasone-formoterol (DULERA) 200-5 MCG/ACT inhaler 2 puff  2 puff Inhalation BID Maryagnes Amos, FNP   2 puff at 10/24/23 0919   OLANZapine zydis (ZYPREXA) disintegrating tablet 5 mg  5 mg Oral TID PRN Maryagnes Amos, FNP       pantoprazole (PROTONIX) EC tablet 20 mg  20 mg Oral Daily Maryagnes Amos, FNP   20 mg at 10/24/23 0920   rivaroxaban (XARELTO) tablet 10 mg  10 mg Oral Daily Maryagnes Amos, FNP   10 mg at 10/24/23 0920    senna-docusate (Senokot-S) tablet 1 tablet  1 tablet Oral QHS PRN Maryagnes Amos, FNP   1 tablet at 10/13/23 2107    Lab Results: No results found for this or any previous visit (from the past 48 hours).  Blood Alcohol level:  Lab Results  Component Value Date   ETH <10 10/10/2023   ETH <10 09/15/2023    Metabolic Disorder Labs: Lab Results  Component Value Date   HGBA1C 5.1 09/15/2023   MPG 99.67 09/15/2023   MPG 103 01/13/2023   No results found for: "PROLACTIN" Lab Results  Component Value Date   CHOL 159 09/15/2023   TRIG 53 09/15/2023   HDL 61 09/15/2023   CHOLHDL 2.6 09/15/2023   VLDL 11 09/15/2023   LDLCALC 87 09/15/2023   LDLCALC 99 01/13/2023    Physical Findings: AIMS:  , ,  ,  ,    CIWA:    COWS:     Musculoskeletal: Strength & Muscle Tone: within normal limits Gait & Station: normal Patient leans: N/A  Psychiatric Specialty Exam:  Presentation  General Appearance:  Appropriate for Environment  Eye Contact: -- (Staring at the wall when talking, minimal eye contact with the interviewer)  Speech: Normal Rate  Speech Volume: Normal  Handedness: Right   Mood and Affect  Mood: Depressed  Affect: Congruent; Depressed   Thought Process  Thought Processes: Coherent  Descriptions of Associations:Intact  Orientation:Full (Time, Place and Person)  Thought Content:Logical  History of Schizophrenia/Schizoaffective disorder:No  Duration of Psychotic Symptoms:N/A  Hallucinations:No data recorded Ideas of Reference:None  Suicidal Thoughts:No data recorded Homicidal Thoughts:No data recorded  Sensorium  Memory: Immediate Good; Recent Good; Remote Good  Judgment: Poor  Insight: Fair   Chartered certified accountant: Fair  Attention Span: Fair  Recall: Jennelle Human of Knowledge: Fair  Language: Fair   Psychomotor Activity  Psychomotor Activity:No data recorded  Assets  Assets: Social Support;  Desire for Improvement   Sleep  Sleep:No data recorded   MENTAL STATUS EXAM: Patient is alert and oriented x 3, pleasant and cooperative, good eye contact, speech is normal and not pressured, mood is depressed; affect is flat; thought process: goal directed; thought content: Some suicidal ideation; judgment is poor, insight is poor.  Blood pressure 138/75, pulse 85, temperature 99.2 F (37.3 C), temperature source Oral, resp. rate 20, height 6\' 4"  (1.93 m), weight 112.5 kg, SpO2 99%. Body mass index is 30.19 kg/m.   Treatment Plan Summary: Daily contact with patient to assess and evaluate symptoms and progress in treatment, Medication management, and Plan continue current medication.  Khiya Friese Tresea Mall, DO 10/24/2023, 1:16 PM

## 2023-10-24 NOTE — Progress Notes (Signed)
   10/24/23 1200  Psych Admission Type (Psych Patients Only)  Admission Status Voluntary  Psychosocial Assessment  Patient Complaints Depression  Eye Contact Fair  Facial Expression Flat  Affect Sad  Speech Logical/coherent  Interaction Assertive  Motor Activity Slow  Appearance/Hygiene Unremarkable  Behavior Characteristics Cooperative  Mood Pleasant  Thought Process  Coherency WDL  Content WDL  Delusions None reported or observed  Perception WDL  Hallucination None reported or observed  Judgment Impaired  Confusion None  Danger to Self  Current suicidal ideation? Denies  Agreement Not to Harm Self Yes  Description of Agreement verbal  Danger to Others  Danger to Others None reported or observed

## 2023-10-24 NOTE — Group Note (Signed)
Recreation Therapy Group Note   Group Topic:Coping Skills  Group Date: 10/24/2023 Start Time: 1500 End Time: 1550 Facilitators: Rosina Lowenstein, LRT, CTRS Location:  Dayroom  Group Description: Coping A-Z. LRT and patients engage in a guided discussion on what coping skills are and gave specific examples. LRT passed out a handout labeled Coping A-Z with blank spaces beside each letter. LRT prompted patients to come up with a coping skill for each of the letters. LRT and patients went over the handout and gave ideas for each letter if anyone had any blanks left on their paper. Patients kept this handout with them that listed 26 different coping skills.   Goal Area(s) Addressed: Patients will be able to define "coping skills". Patient will identify new coping skills.  Patient will increase communication.   Affect/Mood: Appropriate   Participation Level: Active and Engaged   Participation Quality: Independent   Behavior: Appropriate and Calm   Speech/Thought Process: Coherent   Insight: Good   Judgement: Good   Modes of Intervention: Education, Guided Discussion, and Worksheet   Patient Response to Interventions:  Attentive, Engaged, Interested , and Receptive   Education Outcome:  Acknowledges education   Clinical Observations/Individualized Feedback: Robert Chan was active in their participation of session activities and group discussion. Pt identified "sports, ride my bike, food, house work, and jokes" as Pharmacologist. Pt interacted well with LRT and peers duration of session.    Plan: Continue to engage patient in RT group sessions 2-3x/week.   Rosina Lowenstein, LRT, CTRS 10/24/2023 5:31 PM

## 2023-10-24 NOTE — Group Note (Signed)
Date:  10/24/2023 Time:  11:12 AM  Group Topic/Focus:  Overcoming Stress:   The focus of this group is to define stress and help patients assess their triggers.    Participation Level:  Active  Participation Quality:  Appropriate  Affect:  Appropriate  Cognitive:  Appropriate  Insight: Appropriate  Engagement in Group:  Engaged  Modes of Intervention:  Discussion   [name] 10/24/2023, 11:12 AM

## 2023-10-24 NOTE — Group Note (Signed)
Recreation Therapy Group Note   Group Topic:Health and Wellness  Group Date: 10/24/2023 Start Time: 1100 End Time: 1145 Facilitators: Rosina Lowenstein, LRT, CTRS Location:  Dayroom  Group Description: Seated Exercise. LRT discussed the mental and physical benefits of exercise. LRT and group discussed how physical activity can be used as a coping skill. Pt's and LRT followed along to an exercise video on the TV screen that provided a visual representation and audio description of every exercise performed. Pt's encouraged to listen to their bodies and stop at any time if they experience feelings of discomfort or pain. Pts were encouraged to drink water and stay hydrated.   Goal Area(s) Addressed: Patient will learn benefits of physical activity. Patient will identify exercise as a coping skill.  Patient will follow multistep directions.   Affect/Mood: Appropriate and Flat   Participation Level: Moderate   Participation Quality: Independent   Behavior: Alert   Speech/Thought Process: Coherent   Insight: Fair   Judgement: Fair    Modes of Intervention: Activity   Patient Response to Interventions:  Receptive   Education Outcome:  In group clarification offered    Clinical Observations/Individualized Feedback: Robert Chan was mostly active in their participation of session activities and group discussion. Pt completed some of the exercises as shown. Pt minimally interacted with LRT and peers while in group.    Plan: Continue to engage patient in RT group sessions 2-3x/week.   Rosina Lowenstein, LRT, CTRS 10/24/2023 12:31 PM

## 2023-10-24 NOTE — Plan of Care (Signed)
  Problem: Education: Goal: Knowledge of General Education information will improve Description Including pain rating scale, medication(s)/side effects and non-pharmacologic comfort measures Outcome: Progressing   Problem: Health Behavior/Discharge Planning: Goal: Ability to manage health-related needs will improve Outcome: Progressing   

## 2023-10-24 NOTE — Progress Notes (Signed)
   10/24/23 0500  Psych Admission Type (Psych Patients Only)  Admission Status Voluntary  Psychosocial Assessment  Patient Complaints Anxiety;Depression  Eye Contact Fair  Facial Expression Flat  Affect Sad  Speech Logical/coherent  Interaction Assertive  Motor Activity Slow  Appearance/Hygiene Unremarkable  Behavior Characteristics Cooperative;Appropriate to situation  Thought Process  Coherency WDL  Content WDL  Delusions None reported or observed  Perception WDL  Hallucination None reported or observed  Judgment Impaired  Confusion None  Danger to Self  Current suicidal ideation? Denies  Agreement Not to Harm Self Yes  Description of Agreement Verbal  Danger to Others  Danger to Others None reported or observed

## 2023-10-24 NOTE — Group Note (Signed)
Date:  10/24/2023 Time:  12:07 AM  Group Topic/Focus:  Self Care:   The focus of this group is to help patients understand the importance of self-care in order to improve or restore emotional, physical, spiritual, interpersonal, and financial health.    Participation Level:  Active  Participation Quality:  Appropriate  Affect:  Appropriate  Cognitive:  Appropriate  Insight: Good  Engagement in Group:  Engaged  Modes of Intervention:  Clarification  Additional Comments:    Burt Ek 10/24/2023, 12:07 AM

## 2023-10-25 DIAGNOSIS — F1410 Cocaine abuse, uncomplicated: Secondary | ICD-10-CM | POA: Diagnosis not present

## 2023-10-25 DIAGNOSIS — F332 Major depressive disorder, recurrent severe without psychotic features: Secondary | ICD-10-CM | POA: Diagnosis not present

## 2023-10-25 NOTE — Plan of Care (Signed)
  Problem: Clinical Measurements: Goal: Ability to maintain clinical measurements within normal limits will improve Outcome: Progressing Goal: Will remain free from infection Outcome: Progressing Goal: Diagnostic test results will improve Outcome: Progressing Goal: Respiratory complications will improve Outcome: Progressing Goal: Cardiovascular complication will be avoided Outcome: Progressing   Problem: Activity: Goal: Risk for activity intolerance will decrease Outcome: Progressing   Problem: Nutrition: Goal: Adequate nutrition will be maintained Outcome: Progressing   Problem: Coping: Goal: Level of anxiety will decrease Outcome: Progressing   Problem: Elimination: Goal: Will not experience complications related to bowel motility Outcome: Progressing Goal: Will not experience complications related to urinary retention Outcome: Progressing   Problem: Pain Management: Goal: General experience of comfort will improve Outcome: Progressing

## 2023-10-25 NOTE — Progress Notes (Signed)
   10/25/23 2200  Psych Admission Type (Psych Patients Only)  Admission Status Voluntary  Psychosocial Assessment  Patient Complaints Depression  Eye Contact Fair  Facial Expression Flat  Affect Sad  Speech Logical/coherent  Interaction Assertive  Motor Activity Slow  Appearance/Hygiene Unremarkable  Behavior Characteristics Cooperative  Mood Pleasant  Thought Process  Coherency WDL  Content WDL  Delusions None reported or observed  Perception WDL  Hallucination None reported or observed  Judgment Impaired  Confusion None  Danger to Self  Current suicidal ideation? Denies  Agreement Not to Harm Self Yes  Description of Agreement Verbal  Danger to Others  Danger to Others None reported or observed

## 2023-10-25 NOTE — Group Note (Signed)
Date:  10/25/2023 Time:  2:56 PM  Group Topic/Focus:  Holiday  Music and coloring     Participation Level:  Active  Participation Quality:  Appropriate  Affect:  Appropriate  Cognitive:  Appropriate  Insight: Appropriate  Engagement in Group:  Engaged  Modes of Intervention:  Activity  Additional Comments:  none  [name] 10/25/2023, 2:56 PM

## 2023-10-25 NOTE — Progress Notes (Signed)
[facility] MD Progress Note  10/25/2023  Robert Chan  MRN:  [id number]  Robert Chan is a 67 y.o. male who was initially admitted medically for 10/10/2023  for 2 days of chest pain and week of SI/HI. He carries the psychiatric diagnoses of MDD, severe, recurrent, without psychosis and has a past medical history of  atherosclerotic CAD, COPD, MDD without psychosis, hypertension, hx of cocaine use, history of NSTEMI 06/2020, underwent Left cardiac cath showed 30% prox LAD stenosis and 20% mid RCA stenosis .   Subjective:  Chart reviewed, patient's case discussed in multidisciplinary meeting, patient seen during rounds.  Patient reports" fine" mood today.   He reports holidays are hard to deal with.  He is fearful to relapse on cocaine and upon discharge.  Today patient denies thoughts of harming himself or others.  He denies auditory or  visual hallucinations.  Patient has been attending groups and working on coping strategies.   Principal Problem: MDD (major depressive disorder), recurrent episode, severe (HCC) Diagnosis: Principal Problem:   MDD (major depressive disorder), recurrent episode, severe (HCC) Active Problems:   Cocaine abuse (HCC)    Past Psychiatric History: Patient has a long history of depression and cocaine use. Patient was admitted to this facility in October. 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   Depression    Elevated serum creatinine 08/28/2019   Homelessness 06/19/2020   Homicidal ideations 05/25/2023   Hypertension    Major depressive disorder, recurrent episode, 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/11/2022   Major depressive disorder,  single episode, 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/10/2022   MDD (major depressive disorder), recurrent severe, 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   NSTEMI (non-ST elevated myocardial infarction) (HCC)    Schizoaffective disorder (HCC) 11/08/2022   Status post incision and drainage 04/22/2021   Suicidal ideation 06/05/2020   Suicidal ideation 06/05/2020    Past Surgical History:  Procedure Laterality Date   APPENDECTOMY     EYE SURGERY     LEFT HEART CATH AND CORONARY ANGIOGRAPHY N/A 06/05/2020   Procedure: LEFT HEART CATH AND CORONARY ANGIOGRAPHY;  Surgeon: Elder Negus, MD;  Location: [facility];  Service: Cardiovascular;  Laterality: N/A;   PROSTATE SURGERY     Family History:  Family History  Problem Relation Age of Onset   Hypertension Mother    Diabetes Mother    Hypertension Father    Hypertension Sister    Family Psychiatric  History: Patient reports history brothers committed suicide by overdose on drugs  Social History:  Social History   Substance and Sexual Activity  Alcohol Use Yes   Comment: 4 40oz beer a week     Social History   Substance and Sexual Activity  Drug Use Yes   Types: Cocaine   Comment: cocaine use once a month, last use 6 mos ago    Social History   Socioeconomic History   Marital status: Single    Spouse name: Not on file   Number of children: Not on file   Years of education: Not on file   Highest education level: Not on file  Occupational History  Not on file  Tobacco Use   Smoking status: Some Days    Current packs/day: 0.00    Types: Cigarettes    Last attempt to quit: 12/25/2020    Years since quitting: 2.8   Smokeless tobacco: Never   Tobacco comments:    Smokes a couple cigarettes every now and then  Vaping Use   Vaping status: Never Used  Substance and Sexual Activity   Alcohol use: Yes    Comment: 4 40oz beer a week   Drug use: Yes    Types: Cocaine    Comment: cocaine use once a month, last use 6 mos ago   Sexual  activity: Not Currently  Other Topics Concern   Not on file  Social History Narrative   His mother passed at age 58 (when he was 47 years old) and his father raised him and his siblings   His parents were pastors as a lot of his sisters and other family members are today   There was a total of 13 children in his family Had 25 sisters, one sister deceased , Had 3 brothers, all brothers deceased   He is the youngest of the 49 and was "always in trouble in my younger years"   Family still live in [place], [place], some in [place], [place]   Has a Daughter and son with a total of 10 grandchildren (6 grand daughters local and 4 grandsons)    Values family   Married twice, present wife incarcerated   Social Drivers of Corporate investment banker Strain: Low Risk  (10/19/2022)   Overall Financial Resource Strain (CARDIA)    Difficulty of Paying Living Expenses: Not hard at all  Food Insecurity: No Food Insecurity (10/11/2023)   Hunger Vital Sign    Worried About Running Out of Food in the Last Year: Never true    Ran Out of Food in the Last Year: Never true  Recent Concern: Food Insecurity - Food Insecurity Present (09/16/2023)   Hunger Vital Sign    Worried About Running Out of Food in the Last Year: Sometimes true    Ran Out of Food in the Last Year: Sometimes true  Transportation Needs: No Transportation Needs (10/11/2023)   PRAPARE - Administrator, Civil Service (Medical): No    Lack of Transportation (Non-Medical): No  Recent Concern: Transportation Needs - Unmet Transportation Needs (10/10/2023)   PRAPARE - Transportation    Lack of Transportation (Medical): Yes    Lack of Transportation (Non-Medical): Yes  Physical Activity: Inactive (10/19/2022)   Exercise Vital Sign    Days of Exercise per Week: 0 days    Minutes of Exercise per Session: 0 min  Stress: No Stress Concern Present (10/19/2022)   [name] of Occupational Health - Occupational Stress  Questionnaire    Feeling of Stress : Not at all  Social Connections: Moderately Integrated (03/31/2022)   Social Connection and Isolation Panel [NHANES]    Frequency of Communication with Friends and Family: Three times a week    Frequency of Social Gatherings with Friends and Family: Three times a week    Attends Religious Services: 1 to 4 times per year    Active Member of Clubs or Organizations: Yes    Attends Banker Meetings: 1 to 4 times per year    Marital Status: Never married  Sleep: Up-and-down  Appetite:  Fair  Current Medications: Current Facility-Administered Medications  Medication Dose Route Frequency Provider Last Rate Last Admin   acetaminophen (TYLENOL) tablet 650 mg  650 mg Oral Q6H PRN Maryagnes Amos, FNP   650 mg at 10/19/23 1139   alum & mag hydroxide-simeth (MAALOX/MYLANTA) 200-200-20 MG/5ML suspension 30 mL  30 mL Oral Q4H PRN Maryagnes Amos, FNP       ARIPiprazole (ABILIFY) tablet 15 mg  15 mg Oral QPC breakfast [name], DO   15 mg at 10/25/23 1032   atorvastatin (LIPITOR) tablet 80 mg  80 mg Oral Daily Maryagnes Amos, FNP   80 mg at 10/25/23 1032   diltiazem (CARDIZEM CD) 24 hr capsule 180 mg  180 mg Oral Daily Lewanda Rife, MD   180 mg at 10/25/23 1032   docusate sodium (COLACE) capsule 100 mg  100 mg Oral BID Lewanda Rife, MD   100 mg at 10/25/23 1032   guaiFENesin (ROBITUSSIN) 100 MG/5ML liquid 5 mL  5 mL Oral Q6H PRN Lewanda Rife, MD   5 mL at 10/24/23 1813   latanoprost (XALATAN) 0.005 % ophthalmic solution 1 drop  1 drop Both Eyes QHS Maryagnes Amos, FNP   1 drop at 10/24/23 2100   magnesium hydroxide (MILK OF MAGNESIA) suspension 30 mL  30 mL Oral Daily PRN Maryagnes Amos, FNP   30 mL at 10/12/23 1718   melatonin tablet 5 mg  5 mg Oral QHS Lewanda Rife, MD   5 mg at 10/24/23 2100   menthol-cetylpyridinium (CEPACOL) lozenge 3 mg  1  lozenge Oral PRN Lewanda Rife, MD   3 mg at 10/24/23 1813   mirtazapine (REMERON) tablet 30 mg  30 mg Oral QHS Lewanda Rife, MD   30 mg at 10/24/23 2100   mometasone-formoterol (DULERA) 200-5 MCG/ACT inhaler 2 puff  2 puff Inhalation BID Maryagnes Amos, FNP   2 puff at 10/25/23 1032   OLANZapine zydis (ZYPREXA) disintegrating tablet 5 mg  5 mg Oral TID PRN Maryagnes Amos, FNP       pantoprazole (PROTONIX) EC tablet 20 mg  20 mg Oral Daily Maryagnes Amos, FNP   20 mg at 10/25/23 1032   rivaroxaban (XARELTO) tablet 10 mg  10 mg Oral Daily Maryagnes Amos, FNP   10 mg at 10/25/23 1032   senna-docusate (Senokot-S) tablet 1 tablet  1 tablet Oral QHS PRN Maryagnes Amos, FNP   1 tablet at 10/13/23 2107    Lab Results: No results found for this or any previous visit (from the past 48 hours).  Blood Alcohol level:  Lab Results  Component Value Date   ETH <10 10/10/2023   ETH <10 09/15/2023    Metabolic Disorder Labs: Lab Results  Component Value Date   HGBA1C 5.1 09/15/2023   MPG 99.67 09/15/2023   MPG 103 01/13/2023   No results found for: "PROLACTIN" Lab Results  Component Value Date   CHOL 159 09/15/2023   TRIG 53 09/15/2023   HDL 61 09/15/2023   CHOLHDL 2.6 09/15/2023   VLDL 11 09/15/2023   LDLCALC 87 09/15/2023   LDLCALC 99 01/13/2023      Musculoskeletal: Strength & Muscle Tone: within normal limits Gait & Station: normal Patient leans: N/A   Psychiatric Specialty Exam:   Presentation  General Appearance:  Appropriate for Environment   Eye Contact: Fair   Speech: Normal Rate   Speech Volume: Normal   Handedness: Right  Mood and Affect  Mood: Depressed   Affect: Constricted     Thought Process  Thought Processes: Coherent   Descriptions of Associations:Intact   Orientation:Full (Time, Place and Person)   Thought Content: Positive for suicide thoughts   History of  Schizophrenia/Schizoaffective disorder:No   Duration of Psychotic Symptoms:N/A   Hallucinations:Hallucinations: None   Ideas of Reference:None   Suicidal Thoughts: Denies  Homicidal Thoughts: Denies     Sensorium  Memory: Immediate Good; Recent Good; Remote Good   Judgment: Poor   Insight: Fair     Chartered certified accountant: Fair   Attention Span: Fair   Recall: [employer] of Knowledge: Fair   Language: Fair     Psychomotor Activity  Psychomotor Activity: Psychomotor Activity: Normal     Assets  Assets: Social Support; Desire for Improvement     Sleep  Sleep: Improved       Physical Exam: Physical Exam Constitutional:      Appearance: Normal appearance.  HENT:     Head: Normocephalic and atraumatic.     Nose: Nose normal.  Eyes:     Pupils: Pupils are equal, round, and reactive to light.  Cardiovascular:     Rate and Rhythm: Regular rhythm.  Pulmonary:     Effort: Pulmonary effort is normal.  Skin:    General: Skin is warm.  Neurological:     General: No focal deficit present.     Mental Status: He is alert and oriented to person, place, and time.      Review of Systems  Constitutional:  Negative for chills and fever.  HENT:  Negative for hearing loss and sore throat.   Eyes:  Negative for blurred vision and double vision.  Respiratory:  Negative for cough and shortness of breath.   Cardiovascular:  Negative for chest pain.  Gastrointestinal:  Negative for nausea and vomiting.  Neurological:  Negative for dizziness and speech change.  .    Blood pressure 127/60, pulse 72, temperature (!) 97.1 F (36.2 C), resp. rate 18, height 6\' 4"  (1.93 m), weight 112.5 kg, SpO2 99%. Body mass index is 30.19 kg/m.   Treatment Plan Summary: Daily contact with patient to assess and evaluate symptoms and progress in treatment and Medication management Patient is admitted to locked unit under safety precautions Patient has been on  Abilify in the past, at this time patient is denying any psychotic symptoms will defer use of Abilify for now Increase the dose of Remeron to 30 mg at bedtime for depression and insomnia 10/17/23 Patient was started on Abilify 15 mg on 10/21/2023 Patient was encouraged to abstain from alcohol and drug use Patient was encouraged to attend group and work on coping strategies Social worker consulted to get collateral and help with a safe discharge plan. Will start on melatonin 5 mg at bedtime for insomnia 10/17/2023  Lewanda Rife, MD

## 2023-10-25 NOTE — Plan of Care (Signed)
  Problem: Nutrition: Goal: Adequate nutrition will be maintained Outcome: Progressing   Problem: Coping: Goal: Level of anxiety will decrease Outcome: Progressing   

## 2023-10-25 NOTE — Group Note (Signed)
Date:  10/25/2023 Time:  2:12 AM  Group Topic/Focus:  Wrap-Up Group:   The focus of this group is to help patients review their daily goal of treatment and discuss progress on daily workbooks.    Participation Level:  Active  Participation Quality:  Appropriate  Affect:  Appropriate  Cognitive:  Appropriate  Insight: Appropriate  Engagement in Group:  Engaged  Modes of Intervention:  Discussion  Additional Comments:    Robert Chan 10/25/2023, 2:12 AM

## 2023-10-25 NOTE — Progress Notes (Signed)
   10/25/23 0626  15 Minute Checks  Location Bedroom  Visual Appearance Calm  Behavior Sleeping  Sleep (Behavioral Health Patients Only)  Calculate sleep? (Click Yes once per 24 hr at 0600 safety check) Yes  Documented sleep last 24 hours 8.75

## 2023-10-25 NOTE — Progress Notes (Signed)
   10/25/23 1100  Psych Admission Type (Psych Patients Only)  Admission Status Voluntary  Psychosocial Assessment  Patient Complaints Depression  Eye Contact Fair  Facial Expression Flat  Affect Sad  Speech Logical/coherent  Interaction Assertive  Motor Activity Slow  Appearance/Hygiene Unremarkable  Behavior Characteristics Cooperative  Mood Pleasant  Thought Process  Coherency WDL  Content WDL  Delusions None reported or observed  Perception WDL  Hallucination None reported or observed  Judgment Impaired  Confusion None  Danger to Self  Current suicidal ideation? Denies  Agreement Not to Harm Self Yes  Description of Agreement verbal  Danger to Others  Danger to Others None reported or observed

## 2023-10-25 NOTE — Group Note (Signed)
Recreation Therapy Group Note   Group Topic:Leisure Education  Group Date: 10/25/2023 Start Time: 1100 End Time: 1200 Facilitators: Rosina Lowenstein, LRT, CTRS Location:  Dayroom  Group Description: Leisure. Patients were given the option to choose from singing karaoke, color, journaling, or listen to music. LRT and pts discussed the meaning of leisure, the importance of participating in leisure during their free time/when they're outside of the hospital, as well as how our leisure interests can also serve as coping skills.   Goal Area(s) Addressed:  Patient will identify a current leisure interest.  Patient will learn the definition of "leisure". Patient will practice making a positive decision. Patient will have the opportunity to try a new leisure activity. Patient will communicate with peers and LRT.    Affect/Mood: Appropriate   Participation Level: Active and Engaged   Participation Quality: Independent   Behavior: Appropriate, Calm, and Cooperative   Speech/Thought Process: Coherent   Insight: Fair   Judgement: Fair    Modes of Intervention: Activity, Education, Guided Discussion, and Rapport Building   Patient Response to Interventions:  Attentive, Engaged, and Receptive   Education Outcome:  Acknowledges education   Clinical Observations/Individualized Feedback: Maddyn was active in their participation of session activities and group discussion. Pt identified "go to the gym and watch tv" as things he does in his free time. Pt chose to work in his activity book and was observed singing along to the songs being played. Pt interacted well with LRT and peers duration of session.    Plan: Continue to engage patient in RT group sessions 2-3x/week.   Rosina Lowenstein, LRT, CTRS 10/25/2023 12:52 PM

## 2023-10-26 DIAGNOSIS — F332 Major depressive disorder, recurrent severe without psychotic features: Secondary | ICD-10-CM | POA: Diagnosis not present

## 2023-10-26 DIAGNOSIS — F1410 Cocaine abuse, uncomplicated: Secondary | ICD-10-CM | POA: Diagnosis not present

## 2023-10-26 NOTE — Group Note (Signed)
Date:  10/26/2023 Time:  5:53 AM  Group Topic/Focus:  Rediscovering Joy:   The focus of this group is to explore various ways to relieve stress in a positive manner. Wrap-Up Group:   The focus of this group is to help patients review their daily goal of treatment and discuss progress on daily workbooks.    Participation Level:  Active  Participation Quality:  Sharing  Affect:  Appropriate  Cognitive:  Appropriate  Insight: Good  Engagement in Group:  Engaged  Modes of Intervention:  Discussion  Additional Comments:    Osker Mason 10/26/2023, 5:53 AM

## 2023-10-26 NOTE — Progress Notes (Signed)
   10/26/23 0600  15 Minute Checks  Location Bedroom  Visual Appearance Calm  Behavior Sleeping  Sleep (Behavioral Health Patients Only)  Calculate sleep? (Click Yes once per 24 hr at 0600 safety check) Yes  Documented sleep last 24 hours 10

## 2023-10-26 NOTE — Group Note (Signed)
Date:  10/26/2023 Time:  11:21 AM  Group Topic/Focus:  Coloring/Word searches/Music Therapy  The purpose of this group is to allow patients to have leisure time while listening to [employer].     Participation Level:  Did Not Attend  Participation Quality:    Affect:    Cognitive:    Insight:   Engagement in Group:   Modes of Intervention:    Additional Comments:  Did not attend  Shandricka Monroy T Melvine Julin 10/26/2023, 11:21 AM

## 2023-10-26 NOTE — Plan of Care (Signed)
  Problem: Pain Management: Goal: General experience of comfort will improve Outcome: Progressing   Problem: Safety: Goal: Ability to remain free from injury will improve Outcome: Progressing   Problem: Skin Integrity: Goal: Risk for impaired skin integrity will decrease Outcome: Progressing

## 2023-10-26 NOTE — Progress Notes (Signed)
 Patient pleasant and cooperative.  Sad affect.  States his thoughts are "jumbled up."  Denies SI/HI and AVH.  Denies pain.  Good appetite.   Compliant with scheduled medications.  PRN medications given for cough and sore throat.  15 min checks in place for safety.  Patient present in the milieu after lunch.  Appropriate interaction with peers.

## 2023-10-26 NOTE — Progress Notes (Signed)
[facility] MD Progress Note  10/26/2023  Robert Chan  MRN:  [id number]  Robert Chan is a 67 y.o. male who was initially admitted medically for 10/10/2023  for 2 days of chest pain and week of SI/HI. He carries the psychiatric diagnoses of MDD, severe, recurrent, without psychosis and has a past medical history of  atherosclerotic CAD, COPD, MDD without psychosis, hypertension, hx of cocaine use, history of NSTEMI 06/2020, underwent Left cardiac cath showed 30% prox LAD stenosis and 20% mid RCA stenosis .   Subjective:  Chart reviewed, patient's case discussed in multidisciplinary meeting, patient seen during rounds.  Patient reports" depressed" mood today.   He reports holidays are hard to deal with.  He is fearful to relapse on cocaine upon discharge.  Patient was provided with support and reassurance.  Today the patient reports suicidal ideations, denies any intention to harm himself on the unit.  He denies auditory or  visual hallucinations.  Patient has been attending groups and working on coping strategies.   Principal Problem: MDD (major depressive disorder), recurrent episode, severe (HCC) Diagnosis: Principal Problem:   MDD (major depressive disorder), recurrent episode, severe (HCC) Active Problems:   Cocaine abuse (HCC)    Past Psychiatric History: Patient has a long history of depression and cocaine use. Patient was admitted to this facility in October. 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   Depression    Elevated serum creatinine 08/28/2019   Homelessness 06/19/2020   Homicidal ideations 05/25/2023   Hypertension    Major  depressive disorder, recurrent episode, 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/11/2022   Major depressive disorder, single episode, 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/10/2022   MDD (major depressive disorder), recurrent severe, 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   NSTEMI (non-ST elevated myocardial infarction) (HCC)    Schizoaffective disorder (HCC) 11/08/2022   Status post incision and drainage 04/22/2021   Suicidal ideation 06/05/2020   Suicidal ideation 06/05/2020    Past Surgical History:  Procedure Laterality Date   APPENDECTOMY     EYE SURGERY     LEFT HEART CATH AND CORONARY ANGIOGRAPHY N/A 06/05/2020   Procedure: LEFT HEART CATH AND CORONARY ANGIOGRAPHY;  Surgeon: Elder Negus, MD;  Location: [facility];  Service: Cardiovascular;  Laterality: N/A;   PROSTATE SURGERY     Family History:  Family History  Problem Relation Age of Onset   Hypertension Mother    Diabetes Mother    Hypertension Father    Hypertension Sister    Family Psychiatric  History: Patient reports history brothers committed suicide by overdose on drugs  Social History:  Social History   Substance and Sexual Activity  Alcohol Use Yes   Comment: 4 40oz beer a week     Social History   Substance and Sexual Activity  Drug Use Yes   Types: Cocaine   Comment: cocaine use once a month, last use 6 mos ago    Social History   Socioeconomic History   Marital status: Single    Spouse name: Not on file   Number of children: Not on file   Years of education: Not on file  Highest education level: Not on file  Occupational History   Not on file  Tobacco Use   Smoking status: Some Days    Current packs/day: 0.00    Types: Cigarettes    Last attempt to quit: 12/25/2020    Years since quitting: 2.8   Smokeless tobacco: Never   Tobacco comments:    Smokes a couple cigarettes every now and then  Vaping Use   Vaping status: Never Used  Substance and Sexual Activity   Alcohol use: Yes    Comment: 4 40oz beer a week   Drug use:  Yes    Types: Cocaine    Comment: cocaine use once a month, last use 6 mos ago   Sexual activity: Not Currently  Other Topics Concern   Not on file  Social History Narrative   His mother passed at age 60 (when he was 67 years old) and his father raised him and his siblings   His parents were pastors as a lot of his sisters and other family members are today   There was a total of 13 children in his family Had 17 sisters, one sister deceased , Had 3 brothers, all brothers deceased   He is the youngest of the 9 and was "always in trouble in my younger years"   Family still live in [place], [place], some in [place], [place]   Has a Daughter and son with a total of 10 grandchildren (6 grand daughters local and 4 grandsons)    Values family   Married twice, present wife incarcerated   Social Drivers of Corporate investment banker Strain: Low Risk  (10/19/2022)   Overall Financial Resource Strain (CARDIA)    Difficulty of Paying Living Expenses: Not hard at all  Food Insecurity: No Food Insecurity (10/11/2023)   Hunger Vital Sign    Worried About Running Out of Food in the Last Year: Never true    Ran Out of Food in the Last Year: Never true  Recent Concern: Food Insecurity - Food Insecurity Present (09/16/2023)   Hunger Vital Sign    Worried About Running Out of Food in the Last Year: Sometimes true    Ran Out of Food in the Last Year: Sometimes true  Transportation Needs: No Transportation Needs (10/11/2023)   PRAPARE - Administrator, Civil Service (Medical): No    Lack of Transportation (Non-Medical): No  Recent Concern: Transportation Needs - Unmet Transportation Needs (10/10/2023)   PRAPARE - Transportation    Lack of Transportation (Medical): Yes    Lack of Transportation (Non-Medical): Yes  Physical Activity: Inactive (10/19/2022)   Exercise Vital Sign    Days of Exercise per Week: 0 days    Minutes of Exercise per Session: 0 min  Stress: No Stress Concern  Present (10/19/2022)   [name] of Occupational Health - Occupational Stress Questionnaire    Feeling of Stress : Not at all  Social Connections: Moderately Integrated (03/31/2022)   Social Connection and Isolation Panel [NHANES]    Frequency of Communication with Friends and Family: Three times a week    Frequency of Social Gatherings with Friends and Family: Three times a week    Attends Religious Services: 1 to 4 times per year    Active Member of Clubs or Organizations: Yes    Attends Banker Meetings: 1 to 4 times per year    Marital Status: Never married  Sleep: Fair  Appetite:  Fair  Current Medications: Current Facility-Administered Medications  Medication Dose Route Frequency Provider Last Rate Last Admin   acetaminophen (TYLENOL) tablet 650 mg  650 mg Oral Q6H PRN Maryagnes Amos, FNP   650 mg at 10/19/23 1139   alum & mag hydroxide-simeth (MAALOX/MYLANTA) 200-200-20 MG/5ML suspension 30 mL  30 mL Oral Q4H PRN Maryagnes Amos, FNP       ARIPiprazole (ABILIFY) tablet 15 mg  15 mg Oral QPC breakfast [name], DO   15 mg at 10/26/23 7829   atorvastatin (LIPITOR) tablet 80 mg  80 mg Oral Daily Maryagnes Amos, FNP   80 mg at 10/26/23 5621   diltiazem (CARDIZEM CD) 24 hr capsule 180 mg  180 mg Oral Daily Lewanda Rife, MD   180 mg at 10/26/23 3086   docusate sodium (COLACE) capsule 100 mg  100 mg Oral BID Lewanda Rife, MD   100 mg at 10/25/23 1032   guaiFENesin (ROBITUSSIN) 100 MG/5ML liquid 5 mL  5 mL Oral Q6H PRN Lewanda Rife, MD   5 mL at 10/24/23 1813   latanoprost (XALATAN) 0.005 % ophthalmic solution 1 drop  1 drop Both Eyes QHS Maryagnes Amos, FNP   1 drop at 10/25/23 2056   magnesium hydroxide (MILK OF MAGNESIA) suspension 30 mL  30 mL Oral Daily PRN Maryagnes Amos, FNP   30 mL at 10/12/23 1718   melatonin tablet 5 mg  5 mg Oral QHS Lewanda Rife, MD    5 mg at 10/25/23 2055   menthol-cetylpyridinium (CEPACOL) lozenge 3 mg  1 lozenge Oral PRN Lewanda Rife, MD   3 mg at 10/24/23 1813   mirtazapine (REMERON) tablet 30 mg  30 mg Oral QHS Lewanda Rife, MD   30 mg at 10/25/23 2055   mometasone-formoterol (DULERA) 200-5 MCG/ACT inhaler 2 puff  2 puff Inhalation BID Maryagnes Amos, FNP   2 puff at 10/26/23 0939   OLANZapine zydis (ZYPREXA) disintegrating tablet 5 mg  5 mg Oral TID PRN Maryagnes Amos, FNP       pantoprazole (PROTONIX) EC tablet 20 mg  20 mg Oral Daily Maryagnes Amos, FNP   20 mg at 10/26/23 5784   rivaroxaban (XARELTO) tablet 10 mg  10 mg Oral Daily Maryagnes Amos, FNP   10 mg at 10/26/23 6962   senna-docusate (Senokot-S) tablet 1 tablet  1 tablet Oral QHS PRN Maryagnes Amos, FNP   1 tablet at 10/13/23 2107    Lab Results: No results found for this or any previous visit (from the past 48 hours).  Blood Alcohol level:  Lab Results  Component Value Date   ETH <10 10/10/2023   ETH <10 09/15/2023    Metabolic Disorder Labs: Lab Results  Component Value Date   HGBA1C 5.1 09/15/2023   MPG 99.67 09/15/2023   MPG 103 01/13/2023   No results found for: "PROLACTIN" Lab Results  Component Value Date   CHOL 159 09/15/2023   TRIG 53 09/15/2023   HDL 61 09/15/2023   CHOLHDL 2.6 09/15/2023   VLDL 11 09/15/2023   LDLCALC 87 09/15/2023   LDLCALC 99 01/13/2023      Musculoskeletal: Strength & Muscle Tone: within normal limits Gait & Station: normal Patient leans: N/A   Psychiatric Specialty Exam:   Presentation  General Appearance:  Appropriate for Environment   Eye Contact: Fair   Speech: Normal Rate   Speech Volume: Normal   Handedness: Right  Mood and Affect  Mood: Depressed   Affect: Constricted     Thought Process  Thought Processes: Coherent   Descriptions of Associations:Intact   Orientation:Full (Time, Place and Person)   Thought  Content: Positive for suicide thoughts   History of Schizophrenia/Schizoaffective disorder:No   Duration of Psychotic Symptoms:N/A   Hallucinations:Hallucinations: None   Ideas of Reference:None   Suicidal Thoughts: Reports SI today Homicidal Thoughts: Denies     Sensorium  Memory: Immediate Good; Recent Good; Remote Good   Judgment: Poor   Insight: Fair     Chartered certified accountant: Fair   Attention Span: Fair   Recall: [employer] of Knowledge: Fair   Language: Fair     Psychomotor Activity  Psychomotor Activity: Psychomotor Activity: Normal     Assets  Assets: Social Support; Desire for Improvement     Sleep  Sleep: Improved       Physical Exam: Physical Exam Constitutional:      Appearance: Normal appearance.  HENT:     Head: Normocephalic and atraumatic.     Nose: Nose normal.  Eyes:     Pupils: Pupils are equal, round, and reactive to light.  Cardiovascular:     Rate and Rhythm: Regular rhythm.  Pulmonary:     Effort: Pulmonary effort is normal.  Skin:    General: Skin is warm.  Neurological:     General: No focal deficit present.     Mental Status: He is alert and oriented to person, place, and time.      Review of Systems  Constitutional:  Negative for chills and fever.  HENT:  Negative for hearing loss and sore throat.   Eyes:  Negative for blurred vision and double vision.  Respiratory:  Negative for cough and shortness of breath.   Cardiovascular:  Negative for chest pain.  Gastrointestinal:  Negative for nausea and vomiting.  Neurological:  Negative for dizziness and speech change.  .    Blood pressure 120/63, pulse 68, temperature 98.2 F (36.8 C), resp. rate 18, height 6\' 4"  (1.93 m), weight 112.5 kg, SpO2 100%. Body mass index is 30.19 kg/m.   Treatment Plan Summary: Daily contact with patient to assess and evaluate symptoms and progress in treatment and Medication management Patient is admitted to  locked unit under safety precautions Patient has been on Abilify in the past, at this time patient is denying any psychotic symptoms will defer use of Abilify for now Increase the dose of Remeron to 30 mg at bedtime for depression and insomnia 10/17/23 Patient was started on Abilify 15 mg on 10/21/2023 Patient was encouraged to abstain from alcohol and drug use Patient was encouraged to attend group and work on coping strategies Social worker consulted to get collateral and help with a safe discharge plan. started on melatonin 5 mg at bedtime for insomnia 10/17/2023  Lewanda Rife, MD

## 2023-10-27 DIAGNOSIS — F1410 Cocaine abuse, uncomplicated: Secondary | ICD-10-CM | POA: Diagnosis not present

## 2023-10-27 DIAGNOSIS — F332 Major depressive disorder, recurrent severe without psychotic features: Secondary | ICD-10-CM | POA: Diagnosis not present

## 2023-10-27 NOTE — Plan of Care (Signed)
  Problem: Education: Goal: Knowledge of General Education information will improve Description: Including pain rating scale, medication(s)/side effects and non-pharmacologic comfort measures Outcome: Progressing   Problem: Health Behavior/Discharge Planning: Goal: Ability to manage health-related needs will improve Outcome: Progressing   Problem: Clinical Measurements: Goal: Ability to maintain clinical measurements within normal limits will improve Outcome: Progressing Goal: Will remain free from infection Outcome: Progressing Goal: Diagnostic test results will improve Outcome: Progressing Goal: Respiratory complications will improve Outcome: Progressing Goal: Cardiovascular complication will be avoided Outcome: Progressing   Problem: Nutrition: Goal: Adequate nutrition will be maintained Outcome: Progressing   Problem: Coping: Goal: Level of anxiety will decrease Outcome: Progressing   Problem: Safety: Goal: Ability to remain free from injury will improve Outcome: Progressing   

## 2023-10-27 NOTE — Group Note (Signed)
Recreation Therapy Group Note   Group Topic:Relaxation  Group Date: 10/27/2023 Start Time: 1100 End Time: 1145 Facilitators: Rosina Lowenstein, LRT, CTRS Location:  Dayroom  Group Description: PMR (Progressive Muscle Relaxation). LRT educates patients on what PMR is and the benefits that come from it. Patients are asked to sit with their feet flat on the floor while sitting up and all the way back in their chair, if possible. LRT and pts follow a prompt through a speaker that requires you to tense and release different muscles in their body and focus on their breathing. During session, lights are off and soft music is being played. Pts are given a stress ball to use if needed.   Goal Area(s) Addressed:  Patients will be able to describe progressive muscle relaxation.  Patient will practice using relaxation technique. Patient will identify a new coping skill.  Patient will follow multistep directions to reduce anxiety and stress.   Affect/Mood: N/A   Participation Level: Did not attend    Clinical Observations/Individualized Feedback: Patient did not attend group.   Plan: Continue to engage patient in RT group sessions 2-3x/week.   Rosina Lowenstein, LRT, CTRS 10/27/2023 12:45 PM

## 2023-10-27 NOTE — Group Note (Signed)
Recreation Therapy Group Note   Group Topic:Self-Esteem  Group Date: 10/27/2023 Start Time: 1500 End Time: 1545 Facilitators: Rosina Lowenstein, LRT, CTRS Location:  Dayroom  Group Description: My strengths and Qualities. Patients and LRT discussed the importance of self-love/self-esteem and things that cause it to fluctuate, including our mental health or state. Pt completed a worksheet that helps them identify 24 different strengths and qualities about themselves. Pt encouraged to read aloud at least 3 off their sheet to the group. LRT and pts discussed how this can be applied to daily life post-discharge.    Goal Area(s) Addressed: Patient will identify positive qualities about themselves. Patient will learn new positive affirmations.  Patient will recite positive qualities and affirmations aloud to the group.  Patient will practice positive self-talk.  Patient will increase communication.    Affect/Mood: Appropriate and Flat   Participation Level: Active and Engaged   Participation Quality: Independent   Behavior: Appropriate, Calm, and Cooperative   Speech/Thought Process: Coherent   Insight: Good   Judgement: Good   Modes of Intervention: Education, Exploration, Guided Discussion, and Worksheet   Patient Response to Interventions:  Attentive, Engaged, and Receptive   Education Outcome:  Acknowledges education   Clinical Observations/Individualized Feedback: Robert Chan was active in their participation of session activities and group discussion. Pt identified " Things I am good at are sports, camping, and exercise. I have helped others by caring, sharing and giving". Pt interacted well with LRT and peers duration of session.    Plan: Continue to engage patient in RT group sessions 2-3x/week.   Rosina Lowenstein, LRT, CTRS 10/27/2023 5:11 PM

## 2023-10-27 NOTE — Progress Notes (Signed)
   10/27/23 0954  Psych Admission Type (Psych Patients Only)  Admission Status Voluntary  Psychosocial Assessment  Patient Complaints Depression  Eye Contact Fair  Facial Expression Sad  Affect Labile  Speech Logical/coherent  Interaction Assertive  Motor Activity Slow  Appearance/Hygiene Unremarkable  Behavior Characteristics Cooperative  Mood Pleasant  Thought Process  Coherency WDL  Content WDL  Delusions None reported or observed  Perception WDL  Hallucination None reported or observed  Judgment Impaired  Confusion None  Danger to Self  Current suicidal ideation? Denies  Agreement Not to Harm Self Yes  Description of Agreement Verbal  Danger to Others  Danger to Others None reported or observed

## 2023-10-27 NOTE — Group Note (Signed)
Date:  10/26/2023 Time:  9:00 PM  Group Topic/Focus:  Conflict Resolution:   The focus of this group is to discuss the conflict resolution process and how it may be used upon discharge.    Participation Level:  Active  Participation Quality:  Appropriate  Affect:  Appropriate  Cognitive:  Appropriate  Insight: Appropriate  Engagement in Group:  Engaged  Modes of Intervention:  Education  Additional Comments:    Garry Heater 10/26/2023, 9:00 PM

## 2023-10-27 NOTE — Plan of Care (Signed)
  Problem: Health Behavior/Discharge Planning: ?Goal: Ability to manage health-related needs will improve ?Outcome: Progressing ?  ?Problem: Elimination: ?Goal: Will not experience complications related to bowel motility ?Outcome: Progressing ?  ?Problem: Safety: ?Goal: Ability to remain free from injury will improve ?Outcome: Progressing ?  ?

## 2023-10-27 NOTE — Group Note (Signed)
Date:  10/27/2023 Time:  10:39 PM  Group Topic/Focus:  Coping With Mental Health Crisis:   The purpose of this group is to help patients identify strategies for coping with mental health crisis.  Group discusses possible causes of crisis and ways to manage them effectively.    Participation Level:  Active  Participation Quality:  Appropriate  Affect:  Appropriate  Cognitive:  Appropriate  Insight: Appropriate  Engagement in Group:  Engaged  Modes of Intervention:  Education  Additional Comments:    Garry Heater 10/27/2023, 10:39 PM

## 2023-10-27 NOTE — BH IP Treatment Plan (Signed)
Interdisciplinary Treatment and Diagnostic Plan Update  10/27/2023 Time of Session: 9:30 AM  Robert Chan MRN: [id number]  Principal Diagnosis: MDD (major depressive disorder), recurrent episode, severe (HCC)  Secondary Diagnoses: Principal Problem:   MDD (major depressive disorder), recurrent episode, severe (HCC) Active Problems:   Cocaine abuse (HCC)   Current Medications:  Current Facility-Administered Medications  Medication Dose Route Frequency Provider Last Rate Last Admin   acetaminophen (TYLENOL) tablet 650 mg  650 mg Oral Q6H PRN Maryagnes Amos, FNP   650 mg at 10/19/23 1139   alum & mag hydroxide-simeth (MAALOX/MYLANTA) 200-200-20 MG/5ML suspension 30 mL  30 mL Oral Q4H PRN Maryagnes Amos, FNP       ARIPiprazole (ABILIFY) tablet 15 mg  15 mg Oral QPC breakfast [name], DO   15 mg at 10/27/23 0954   atorvastatin (LIPITOR) tablet 80 mg  80 mg Oral Daily Maryagnes Amos, FNP   80 mg at 10/27/23 0954   diltiazem (CARDIZEM CD) 24 hr capsule 180 mg  180 mg Oral Daily Lewanda Rife, MD   180 mg at 10/27/23 0954   docusate sodium (COLACE) capsule 100 mg  100 mg Oral BID Lewanda Rife, MD   100 mg at 10/25/23 1032   guaiFENesin (ROBITUSSIN) 100 MG/5ML liquid 5 mL  5 mL Oral Q6H PRN Lewanda Rife, MD   5 mL at 10/26/23 2148   latanoprost (XALATAN) 0.005 % ophthalmic solution 1 drop  1 drop Both Eyes QHS Maryagnes Amos, FNP   1 drop at 10/26/23 2147   magnesium hydroxide (MILK OF MAGNESIA) suspension 30 mL  30 mL Oral Daily PRN Maryagnes Amos, FNP   30 mL at 10/12/23 1718   melatonin tablet 5 mg  5 mg Oral QHS Lewanda Rife, MD   5 mg at 10/26/23 2148   menthol-cetylpyridinium (CEPACOL) lozenge 3 mg  1 lozenge Oral PRN Lewanda Rife, MD   3 mg at 10/26/23 1451   mirtazapine (REMERON) tablet 30 mg  30 mg Oral QHS Lewanda Rife, MD   30 mg at 10/26/23 2149   mometasone-formoterol (DULERA) 200-5 MCG/ACT  inhaler 2 puff  2 puff Inhalation BID Maryagnes Amos, FNP   2 puff at 10/27/23 0959   OLANZapine zydis (ZYPREXA) disintegrating tablet 5 mg  5 mg Oral TID PRN Maryagnes Amos, FNP       pantoprazole (PROTONIX) EC tablet 20 mg  20 mg Oral Daily Maryagnes Amos, FNP   20 mg at 10/27/23 6387   senna-docusate (Senokot-S) tablet 1 tablet  1 tablet Oral QHS PRN Maryagnes Amos, FNP   1 tablet at 10/13/23 2107   PTA Medications: Medications Prior to Admission  Medication Sig Dispense Refill Last Dose/Taking   albuterol (VENTOLIN HFA) 108 (90 Base) MCG/ACT inhaler Inhale 2 puffs into the lungs every 6 (six) hours as needed for shortness of breath. 18 g 0    ARIPiprazole (ABILIFY) 5 MG tablet Take 1 tablet (5 mg total) by mouth daily. (Patient not taking: Reported on 10/10/2023) 30 tablet 0    atorvastatin (LIPITOR) 80 MG tablet Take 1 tablet (80 mg total) by mouth daily. (Patient not taking: Reported on 10/10/2023) 30 tablet 0    diltiazem (CARDIZEM CD) 180 MG 24 hr capsule Take 1 capsule (180 mg total) by mouth daily. (Patient not taking: Reported on 10/10/2023) 30 capsule 0    latanoprost (XALATAN) 0.005 % ophthalmic solution Place 1 drop into both eyes at bedtime. (Patient not taking:  Reported on 10/10/2023) 2.5 mL 12    lidocaine (LIDODERM) 5 % Place 1 patch onto the skin daily. Remove & Discard patch within 12 hours or as directed by MD      mometasone-formoterol (DULERA) 200-5 MCG/ACT AERO Inhale 2 puffs into the lungs 2 (two) times daily. (Patient not taking: Reported on 10/10/2023) 1 each 1    traZODone (DESYREL) 50 MG tablet Take 1 tablet (50 mg total) by mouth at bedtime as needed for sleep. (Patient not taking: Reported on 10/10/2023) 30 tablet 0     Patient Stressors: Marital or family conflict   Medication change or noncompliance    Patient Strengths: Ability for insight  Forensic psychologist fund of knowledge  Motivation for treatment/growth   Treatment  Modalities: Medication Management, Group therapy, Case management,  1 to 1 session with clinician, Psychoeducation, Recreational therapy.   Physician Treatment Plan for Primary Diagnosis: MDD (major depressive disorder), recurrent episode, severe (HCC) Long Term Goal(s): Improvement in symptoms so as ready for discharge   Short Term Goals: Ability to identify changes in lifestyle to reduce recurrence of condition will improve Ability to verbalize feelings will improve Ability to disclose and discuss suicidal ideas Ability to demonstrate self-control will improve Ability to identify and develop effective coping behaviors will improve Ability to maintain clinical measurements within normal limits will improve Compliance with prescribed medications will improve Ability to identify triggers associated with substance abuse/mental health issues will improve  Medication Management: Evaluate patient's response, side effects, and tolerance of medication regimen.  Therapeutic Interventions: 1 to 1 sessions, Unit Group sessions and Medication administration.  Evaluation of Outcomes: Progressing  Physician Treatment Plan for Secondary Diagnosis: Principal Problem:   MDD (major depressive disorder), recurrent episode, severe (HCC) Active Problems:   Cocaine abuse (HCC)  Long Term Goal(s): Improvement in symptoms so as ready for discharge   Short Term Goals: Ability to identify changes in lifestyle to reduce recurrence of condition will improve Ability to verbalize feelings will improve Ability to disclose and discuss suicidal ideas Ability to demonstrate self-control will improve Ability to identify and develop effective coping behaviors will improve Ability to maintain clinical measurements within normal limits will improve Compliance with prescribed medications will improve Ability to identify triggers associated with substance abuse/mental health issues will improve     Medication  Management: Evaluate patient's response, side effects, and tolerance of medication regimen.  Therapeutic Interventions: 1 to 1 sessions, Unit Group sessions and Medication administration.  Evaluation of Outcomes: Progressing   RN Treatment Plan for Primary Diagnosis: MDD (major depressive disorder), recurrent episode, severe (HCC) Long Term Goal(s): Knowledge of disease and therapeutic regimen to maintain health will improve  Short Term Goals: Ability to remain free from injury will improve, Ability to verbalize frustration and anger appropriately will improve, Ability to demonstrate self-control, Ability to participate in decision making will improve, Ability to verbalize feelings will improve, Ability to disclose and discuss suicidal ideas, Ability to identify and develop effective coping behaviors will improve, and Compliance with prescribed medications will improve  Medication Management: RN will administer medications as ordered by provider, will assess and evaluate patient's response and provide education to patient for prescribed medication. RN will report any adverse and/or side effects to prescribing provider.  Therapeutic Interventions: 1 on 1 counseling sessions, Psychoeducation, Medication administration, Evaluate responses to treatment, Monitor vital signs and CBGs as ordered, Perform/monitor CIWA, COWS, AIMS and Fall Risk screenings as ordered, Perform wound care treatments as ordered.  Evaluation of  Outcomes: Progressing   LCSW Treatment Plan for Primary Diagnosis: MDD (major depressive disorder), recurrent episode, severe (HCC) Long Term Goal(s): Safe transition to appropriate next level of care at discharge, Engage patient in therapeutic group addressing interpersonal concerns.  Short Term Goals: Engage patient in aftercare planning with referrals and resources, Increase social support, Increase ability to appropriately verbalize feelings, Increase emotional regulation,  Facilitate acceptance of mental health diagnosis and concerns, Facilitate patient progression through stages of change regarding substance use diagnoses and concerns, Identify triggers associated with mental health/substance abuse issues, and Increase skills for wellness and recovery  Therapeutic Interventions: Assess for all discharge needs, 1 to 1 time with Social worker, Explore available resources and support systems, Assess for adequacy in community support network, Educate family and significant other(s) on suicide prevention, Complete Psychosocial Assessment, Interpersonal group therapy.  Evaluation of Outcomes: Progressing   Progress in Treatment: Attending groups: Yes. and No. Participating in groups: Yes. and No. Taking medication as prescribed: Yes. Toleration medication: Yes. Family/Significant other contact made: No, will contact:  once patient provides consent. Patient understands diagnosis: No. Discussing patient identified problems/goals with staff: Yes. Medical problems stabilized or resolved: Yes. Denies suicidal/homicidal ideation: Yes. Issues/concerns per patient self-inventory: No. Other: None   New problem(s) identified: No, Describe:  none Update 10/17/23: No changes at this time Update 10/22/23: No changes at this time Update 10/27/23: No changes at this time    New Short Term/Long Term Goal(s): detox, elimination of symptoms of psychosis, medication management for mood stabilization; elimination of SI thoughts; development of comprehensive mental wellness/sobriety plan.  Update 10/17/23: No changes at this time Update 10/22/23: No changes at this time Update 10/27/23: No changes at this time      Patient Goals:  "not come back" Update 10/17/23: No changes at this time Update 10/22/23: No changes at this time Update 10/27/23: No changes at this time      Discharge Plan or Barriers: Patient reports plans to return to his home at discharge. He reports plans to begin  aftercare treatment once discharged.  Update 10/17/23: No changes at this time Update 10/22/23: No changes at this time Update 10/27/23: No changes at this time     Reason for Continuation of Hospitalization: Anxiety Depression Homicidal ideation Medication stabilization Suicidal ideation   Estimated Length of Stay:  1-7 days Update 10/17/23: TBD Update 10/22/23: TBD Update 10/27/23: TBD   Last 3 [place] Suicide Severity Risk Score: Flowsheet Row Admission (Current) from 10/11/2023 in [facility] ED to Hosp-Admission (Discharged) from 10/10/2023 in [facility] ED from 09/16/2023 in [facility]  C-SSRS RISK CATEGORY No Risk High Risk No Risk       Last PHQ 2/9 Scores:    09/21/2023    2:39 PM 09/21/2023    6:56 AM 09/16/2023    2:19 PM  Depression screen PHQ 2/9  Decreased Interest 1 0 0  Down, Depressed, Hopeless 0 0 1  PHQ - 2 Score 1 0 1    Scribe for Treatment Team: Elza Rafter, [place] 10/27/2023 10:34 AM

## 2023-10-27 NOTE — Progress Notes (Signed)
Patient is pleasant , calm and cooperative and pleasant . Pt is seen coloring in the dayroom. Pt denies anxiety and depression, pain , as well as SI/HI/AVH  at this time.   Scheduled medications administered  as ordered. Pt compliant with medications an plan of care. Pt is socializing with staff and peers. We will keep monitoring patient for the rest of the shift.

## 2023-10-27 NOTE — Progress Notes (Signed)
[facility] MD Progress Note  10/27/2023  Robert Chan  MRN:  [id number]  Robert Chan is a 67 y.o. male who was initially admitted medically for 10/10/2023  for 2 days of chest pain and week of SI/HI. He carries the psychiatric diagnoses of MDD, severe, recurrent, without psychosis and has a past medical history of  atherosclerotic CAD, COPD, MDD without psychosis, hypertension, hx of cocaine use, history of NSTEMI 06/2020, underwent Left cardiac cath showed 30% prox LAD stenosis and 20% mid RCA stenosis .   Subjective:  Chart reviewed, patient's case discussed in multidisciplinary meeting, patient seen during rounds.  Patient continues to report" depressed" mood today. He is fearful to relapse on cocaine upon discharge.  Patient was provided with support and reassurance.  Patient reports suicidal ideations, denies any intention to harm himself on the unit.  He denies auditory or  visual hallucinations.  Patient has been attending groups and working on coping strategies. Patient's hygiene is poor, he was encouraged to take shower.   Principal Problem: MDD (major depressive disorder), recurrent episode, severe (HCC) Diagnosis: Principal Problem:   MDD (major depressive disorder), recurrent episode, severe (HCC) Active Problems:   Cocaine abuse (HCC)    Past Psychiatric History: Patient has a long history of depression and cocaine use. Patient was admitted to this facility in October. 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   Depression    Elevated serum creatinine 08/28/2019   Homelessness 06/19/2020   Homicidal ideations 05/25/2023    Hypertension    Major depressive disorder, recurrent episode, 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/11/2022   Major depressive disorder, single episode, 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/10/2022   MDD (major depressive disorder), recurrent severe, 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   NSTEMI (non-ST elevated myocardial infarction) (HCC)    Schizoaffective disorder (HCC) 11/08/2022   Status post incision and drainage 04/22/2021   Suicidal ideation 06/05/2020   Suicidal ideation 06/05/2020    Past Surgical History:  Procedure Laterality Date   APPENDECTOMY     EYE SURGERY     LEFT HEART CATH AND CORONARY ANGIOGRAPHY N/A 06/05/2020   Procedure: LEFT HEART CATH AND CORONARY ANGIOGRAPHY;  Surgeon: Elder Negus, MD;  Location: [facility] INVASIVE CV LAB;  Service: Cardiovascular;  Laterality: N/A;   PROSTATE SURGERY     Family History:  Family History  Problem Relation Age of Onset   Hypertension Mother    Diabetes Mother    Hypertension Father    Hypertension Sister    Family Psychiatric  History: Patient reports history brothers committed suicide by overdose on drugs  Social History:  Social History   Substance and Sexual Activity  Alcohol Use Yes   Comment: 4 40oz beer a week     Social History   Substance and Sexual Activity  Drug Use Yes   Types: Cocaine   Comment: cocaine use once a month, last use 6 mos ago    Social History   Socioeconomic History   Marital status: Single    Spouse name: Not on file   Number of children: Not on file   Years of education: Not on file  Highest education level: Not on file  Occupational History   Not on file  Tobacco Use   Smoking status: Some Days    Current packs/day: 0.00    Types: Cigarettes    Last attempt to quit: 12/25/2020    Years since quitting: 2.8   Smokeless tobacco: Never   Tobacco comments:    Smokes a couple cigarettes every now and then  Vaping Use   Vaping status: Never Used  Substance and Sexual Activity   Alcohol use: Yes    Comment: 4 40oz  beer a week   Drug use: Yes    Types: Cocaine    Comment: cocaine use once a month, last use 6 mos ago   Sexual activity: Not Currently  Other Topics Concern   Not on file  Social History Narrative   His mother passed at age 31 (when he was 60 years old) and his father raised him and his siblings   His parents were pastors as a lot of his sisters and other family members are today   There was a total of 13 children in his family Had 39 sisters, one sister deceased , Had 3 brothers, all brothers deceased   He is the youngest of the 54 and was "always in trouble in my younger years"   Family still live in [place], [place], some in [place], [place]   Has a Daughter and son with a total of 10 grandchildren (6 grand daughters local and 4 grandsons)    Values family   Married twice, present wife incarcerated   Social Drivers of Corporate investment banker Strain: Low Risk  (10/19/2022)   Overall Financial Resource Strain (CARDIA)    Difficulty of Paying Living Expenses: Not hard at all  Food Insecurity: No Food Insecurity (10/11/2023)   Hunger Vital Sign    Worried About Running Out of Food in the Last Year: Never true    Ran Out of Food in the Last Year: Never true  Recent Concern: Food Insecurity - Food Insecurity Present (09/16/2023)   Hunger Vital Sign    Worried About Running Out of Food in the Last Year: Sometimes true    Ran Out of Food in the Last Year: Sometimes true  Transportation Needs: No Transportation Needs (10/11/2023)   PRAPARE - Administrator, Civil Service (Medical): No    Lack of Transportation (Non-Medical): No  Recent Concern: Transportation Needs - Unmet Transportation Needs (10/10/2023)   PRAPARE - Transportation    Lack of Transportation (Medical): Yes    Lack of Transportation (Non-Medical): Yes  Physical Activity: Inactive (10/19/2022)   Exercise Vital Sign    Days of Exercise per Week: 0 days    Minutes of Exercise per Session: 0 min  Stress:  No Stress Concern Present (10/19/2022)   [name] of Occupational Health - Occupational Stress Questionnaire    Feeling of Stress : Not at all  Social Connections: Moderately Integrated (03/31/2022)   Social Connection and Isolation Panel [NHANES]    Frequency of Communication with Friends and Family: Three times a week    Frequency of Social Gatherings with Friends and Family: Three times a week    Attends Religious Services: 1 to 4 times per year    Active Member of Clubs or Organizations: Yes    Attends Banker Meetings: 1 to 4 times per year    Marital Status: Never married  Sleep: Fair  Appetite:  Fair  Current Medications: Current Facility-Administered Medications  Medication Dose Route Frequency Provider Last Rate Last Admin   acetaminophen (TYLENOL) tablet 650 mg  650 mg Oral Q6H PRN Maryagnes Amos, FNP   650 mg at 10/19/23 1139   alum & mag hydroxide-simeth (MAALOX/MYLANTA) 200-200-20 MG/5ML suspension 30 mL  30 mL Oral Q4H PRN Maryagnes Amos, FNP       ARIPiprazole (ABILIFY) tablet 15 mg  15 mg Oral QPC breakfast [name], DO   15 mg at 10/27/23 0954   atorvastatin (LIPITOR) tablet 80 mg  80 mg Oral Daily Maryagnes Amos, FNP   80 mg at 10/27/23 0954   diltiazem (CARDIZEM CD) 24 hr capsule 180 mg  180 mg Oral Daily Lewanda Rife, MD   180 mg at 10/27/23 0954   docusate sodium (COLACE) capsule 100 mg  100 mg Oral BID Lewanda Rife, MD   100 mg at 10/25/23 1032   guaiFENesin (ROBITUSSIN) 100 MG/5ML liquid 5 mL  5 mL Oral Q6H PRN Lewanda Rife, MD   5 mL at 10/27/23 1817   latanoprost (XALATAN) 0.005 % ophthalmic solution 1 drop  1 drop Both Eyes QHS Maryagnes Amos, FNP   1 drop at 10/26/23 2147   magnesium hydroxide (MILK OF MAGNESIA) suspension 30 mL  30 mL Oral Daily PRN Maryagnes Amos, FNP   30 mL at 10/12/23 1718   melatonin tablet 5 mg  5 mg Oral QHS  Lewanda Rife, MD   5 mg at 10/26/23 2148   menthol-cetylpyridinium (CEPACOL) lozenge 3 mg  1 lozenge Oral PRN Lewanda Rife, MD   3 mg at 10/27/23 1815   mirtazapine (REMERON) tablet 30 mg  30 mg Oral QHS Lewanda Rife, MD   30 mg at 10/26/23 2149   mometasone-formoterol (DULERA) 200-5 MCG/ACT inhaler 2 puff  2 puff Inhalation BID Maryagnes Amos, FNP   2 puff at 10/27/23 0959   OLANZapine zydis (ZYPREXA) disintegrating tablet 5 mg  5 mg Oral TID PRN Maryagnes Amos, FNP       pantoprazole (PROTONIX) EC tablet 20 mg  20 mg Oral Daily Maryagnes Amos, FNP   20 mg at 10/27/23 0954   senna-docusate (Senokot-S) tablet 1 tablet  1 tablet Oral QHS PRN Maryagnes Amos, FNP   1 tablet at 10/13/23 2107    Lab Results: No results found for this or any previous visit (from the past 48 hours).  Blood Alcohol level:  Lab Results  Component Value Date   ETH <10 10/10/2023   ETH <10 09/15/2023    Metabolic Disorder Labs: Lab Results  Component Value Date   HGBA1C 5.1 09/15/2023   MPG 99.67 09/15/2023   MPG 103 01/13/2023   No results found for: "PROLACTIN" Lab Results  Component Value Date   CHOL 159 09/15/2023   TRIG 53 09/15/2023   HDL 61 09/15/2023   CHOLHDL 2.6 09/15/2023   VLDL 11 09/15/2023   LDLCALC 87 09/15/2023   LDLCALC 99 01/13/2023      Musculoskeletal: Strength & Muscle Tone: within normal limits Gait & Station: normal Patient leans: N/A   Psychiatric Specialty Exam:   Presentation  General Appearance:  Appropriate for Environment   Eye Contact: Fair   Speech: Normal Rate   Speech Volume: Normal   Handedness: Right     Mood and Affect  Mood: Depressed   Affect: Constricted     Thought Process  Thought Processes:  Coherent   Descriptions of Associations:Intact   Orientation:Full (Time, Place and Person)   Thought Content: Positive for suicide thoughts   History of Schizophrenia/Schizoaffective  disorder:No   Duration of Psychotic Symptoms:N/A   Hallucinations:Hallucinations: None   Ideas of Reference:None   Suicidal Thoughts: Reports SI  Homicidal Thoughts: Denies     Sensorium  Memory: Immediate Good; Recent Good; Remote Good   Judgment: Poor   Insight: Fair     Chartered certified accountant: Fair   Attention Span: Fair   Recall: [employer] of Knowledge: Fair   Language: Fair     Psychomotor Activity  Psychomotor Activity: Psychomotor Activity: Normal     Assets  Assets: Social Support; Desire for Improvement     Sleep  Sleep: Improved       Physical Exam: Physical Exam Constitutional:      Appearance: Normal appearance.  HENT:     Head: Normocephalic and atraumatic.     Nose: Nose normal.  Eyes:     Pupils: Pupils are equal, round, and reactive to light.  Cardiovascular:     Rate and Rhythm: Regular rhythm.  Pulmonary:     Effort: Pulmonary effort is normal.  Skin:    General: Skin is warm.  Neurological:     General: No focal deficit present.     Mental Status: He is alert and oriented to person, place, and time.      Review of Systems  Constitutional:  Negative for chills and fever.  HENT:  Negative for hearing loss and sore throat.   Eyes:  Negative for blurred vision and double vision.  Respiratory:  Negative for cough and shortness of breath.   Cardiovascular:  Negative for chest pain.  Gastrointestinal:  Negative for nausea and vomiting.  Neurological:  Negative for dizziness and speech change.  .    Blood pressure 123/74, pulse 89, temperature (!) 97.4 F (36.3 C), resp. rate 15, height 6\' 4"  (1.93 m), weight 112.5 kg, SpO2 99%. Body mass index is 30.19 kg/m.   Treatment Plan Summary: Daily contact with patient to assess and evaluate symptoms and progress in treatment and Medication management Patient is admitted to locked unit under safety precautions Patient has been on Abilify in the past, at  this time patient is denying any psychotic symptoms will defer use of Abilify for now Increase the dose of Remeron to 30 mg at bedtime for depression and insomnia 10/17/23 Patient was started on Abilify 15 mg on 10/21/2023 Patient was encouraged to abstain from alcohol and drug use Patient was encouraged to attend group and work on coping strategies Social worker consulted to get collateral and help with a safe discharge plan. started on melatonin 5 mg at bedtime for insomnia 10/17/2023  Lewanda Rife, MD

## 2023-10-27 NOTE — Plan of Care (Signed)
  Problem: Education: Goal: Knowledge of General Education information will improve Description: Including pain rating scale, medication(s)/side effects and non-pharmacologic comfort measures Outcome: Not Progressing   Problem: Health Behavior/Discharge Planning: Goal: Ability to manage health-related needs will improve Outcome: Not Progressing   Problem: Clinical Measurements: Goal: Ability to maintain clinical measurements within normal limits will improve Outcome: Not Progressing Goal: Will remain free from infection Outcome: Not Progressing Goal: Diagnostic test results will improve Outcome: Not Progressing Goal: Respiratory complications will improve Outcome: Not Progressing Goal: Cardiovascular complication will be avoided Outcome: Not Progressing   Problem: Activity: Goal: Risk for activity intolerance will decrease Outcome: Not Progressing   Problem: Nutrition: Goal: Adequate nutrition will be maintained Outcome: Not Progressing   Problem: Coping: Goal: Level of anxiety will decrease Outcome: Not Progressing   Problem: Elimination: Goal: Will not experience complications related to bowel motility Outcome: Not Progressing Goal: Will not experience complications related to urinary retention Outcome: Not Progressing   Problem: Pain Management: Goal: General experience of comfort will improve Outcome: Not Progressing   Problem: Safety: Goal: Ability to remain free from injury will improve Outcome: Not Progressing   Problem: Skin Integrity: Goal: Risk for impaired skin integrity will decrease Outcome: Not Progressing   Problem: Education: Goal: Utilization of techniques to improve thought processes will improve Outcome: Not Progressing

## 2023-10-28 DIAGNOSIS — F332 Major depressive disorder, recurrent severe without psychotic features: Secondary | ICD-10-CM | POA: Diagnosis not present

## 2023-10-28 DIAGNOSIS — F1410 Cocaine abuse, uncomplicated: Secondary | ICD-10-CM | POA: Diagnosis not present

## 2023-10-28 MED ORDER — GUAIFENESIN ER 600 MG PO TB12
600.0000 mg | ORAL_TABLET | Freq: Two times a day (BID) | ORAL | Status: DC
  Administered 2023-10-28 – 2023-11-01 (×9): 600 mg via ORAL
  Filled 2023-10-28 (×9): qty 1

## 2023-10-28 NOTE — Progress Notes (Signed)
[facility] MD Progress Note  10/28/2023  Robert Chan  MRN:  [id number]  Robert Chan is a 67 y.o. male who was initially admitted medically for 10/10/2023  for 2 days of chest pain and week of SI/HI. He carries the psychiatric diagnoses of MDD, severe, recurrent, without psychosis and has a past medical history of  atherosclerotic CAD, COPD, MDD without psychosis, hypertension, hx of cocaine use, history of NSTEMI 06/2020, underwent Left cardiac cath showed 30% prox LAD stenosis and 20% mid RCA stenosis .   Subjective:  Chart reviewed, patient's case discussed in multidisciplinary meeting, patient seen during rounds.  Patient continues to report" Ok" mood today.  Patient reports suicidal ideations, denies any intention to harm himself on the unit.  He denies auditory or  visual hallucinations.  Patient has been attending groups and working on coping strategies. Patient reports he took shower yesterday.   Principal Problem: MDD (major depressive disorder), recurrent episode, severe (HCC) Diagnosis: Principal Problem:   MDD (major depressive disorder), recurrent episode, severe (HCC) Active Problems:   Cocaine abuse (HCC)    Past Psychiatric History: Patient has a long history of depression and cocaine use. Patient was admitted to this facility in October. 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   Depression    Elevated serum creatinine 08/28/2019   Homelessness 06/19/2020   Homicidal ideations 05/25/2023   Hypertension    Major depressive disorder, recurrent episode, 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/11/2022   Major depressive disorder, single episode, severe  (HCC) 08/10/2022   MDD (major depressive disorder), recurrent severe, 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   NSTEMI (non-ST elevated myocardial infarction) (HCC)    Schizoaffective disorder (HCC) 11/08/2022   Status post incision and drainage 04/22/2021   Suicidal ideation 06/05/2020   Suicidal ideation 06/05/2020    Past Surgical History:  Procedure Laterality Date   APPENDECTOMY     EYE SURGERY     LEFT HEART CATH AND CORONARY ANGIOGRAPHY N/A 06/05/2020   Procedure: LEFT HEART CATH AND CORONARY ANGIOGRAPHY;  Surgeon: Elder Negus, MD;  Location: [facility];  Service: Cardiovascular;  Laterality: N/A;   PROSTATE SURGERY     Family History:  Family History  Problem Relation Age of Onset   Hypertension Mother    Diabetes Mother    Hypertension Father    Hypertension Sister    Family Psychiatric  History: Patient reports history brothers committed suicide by overdose on drugs  Social History:  Social History   Substance and Sexual Activity  Alcohol Use Yes   Comment: 4 40oz beer a week     Social History   Substance and Sexual Activity  Drug Use Yes   Types: Cocaine   Comment: cocaine use once a month, last use 6 mos ago    Social History   Socioeconomic History   Marital status: Single    Spouse name: Not on file   Number of children: Not on file   Years of education: Not on file   Highest education level: Not on file  Occupational History   Not on file  Tobacco Use   Smoking  status: Some Days    Current packs/day: 0.00    Types: Cigarettes    Last attempt to quit: 12/25/2020    Years since quitting: 2.8   Smokeless tobacco: Never   Tobacco comments:    Smokes a couple cigarettes every now and then  Vaping Use   Vaping status: Never Used  Substance and Sexual Activity   Alcohol use: Yes    Comment: 4 40oz beer a week   Drug use: Yes    Types: Cocaine    Comment: cocaine use once a month, last use 6 mos ago   Sexual activity: Not Currently   Other Topics Concern   Not on file  Social History Narrative   His mother passed at age 67 (when he was 50 years old) and his father raised him and his siblings   His parents were pastors as a lot of his sisters and other family members are today   There was a total of 13 children in his family Had 53 sisters, one sister deceased , Had 3 brothers, all brothers deceased   He is the youngest of the 26 and was "always in trouble in my younger years"   Family still live in [place], [place], some in [place], [place]   Has a Daughter and son with a total of 10 grandchildren (6 grand daughters local and 4 grandsons)    Values family   Married twice, present wife incarcerated   Social Drivers of Corporate investment banker Strain: Low Risk  (10/19/2022)   Overall Financial Resource Strain (CARDIA)    Difficulty of Paying Living Expenses: Not hard at all  Food Insecurity: No Food Insecurity (10/11/2023)   Hunger Vital Sign    Worried About Running Out of Food in the Last Year: Never true    Ran Out of Food in the Last Year: Never true  Recent Concern: Food Insecurity - Food Insecurity Present (09/16/2023)   Hunger Vital Sign    Worried About Running Out of Food in the Last Year: Sometimes true    Ran Out of Food in the Last Year: Sometimes true  Transportation Needs: No Transportation Needs (10/11/2023)   PRAPARE - Administrator, Civil Service (Medical): No    Lack of Transportation (Non-Medical): No  Recent Concern: Transportation Needs - Unmet Transportation Needs (10/10/2023)   PRAPARE - Transportation    Lack of Transportation (Medical): Yes    Lack of Transportation (Non-Medical): Yes  Physical Activity: Inactive (10/19/2022)   Exercise Vital Sign    Days of Exercise per Week: 0 days    Minutes of Exercise per Session: 0 min  Stress: No Stress Concern Present (10/19/2022)   [name] of Occupational Health - Occupational Stress Questionnaire    Feeling of  Stress : Not at all  Social Connections: Moderately Integrated (03/31/2022)   Social Connection and Isolation Panel [NHANES]    Frequency of Communication with Friends and Family: Three times a week    Frequency of Social Gatherings with Friends and Family: Three times a week    Attends Religious Services: 1 to 4 times per year    Active Member of Clubs or Organizations: Yes    Attends Banker Meetings: 1 to 4 times per year    Marital Status: Never married                         Sleep: Fair  Appetite:  Fair  Current Medications: Current Facility-Administered Medications  Medication Dose Route Frequency Provider Last Rate Last Admin   acetaminophen (TYLENOL) tablet 650 mg  650 mg Oral Q6H PRN Maryagnes Amos, FNP   650 mg at 10/19/23 1139   alum & mag hydroxide-simeth (MAALOX/MYLANTA) 200-200-20 MG/5ML suspension 30 mL  30 mL Oral Q4H PRN Maryagnes Amos, FNP       ARIPiprazole (ABILIFY) tablet 15 mg  15 mg Oral QPC breakfast [name], DO   15 mg at 10/28/23 1025   atorvastatin (LIPITOR) tablet 80 mg  80 mg Oral Daily Maryagnes Amos, FNP   80 mg at 10/28/23 1025   diltiazem (CARDIZEM CD) 24 hr capsule 180 mg  180 mg Oral Daily Lewanda Rife, MD   180 mg at 10/28/23 1025   docusate sodium (COLACE) capsule 100 mg  100 mg Oral BID Lewanda Rife, MD   100 mg at 10/28/23 1025   guaiFENesin (MUCINEX) 12 hr tablet 600 mg  600 mg Oral BID Lewanda Rife, MD       latanoprost (XALATAN) 0.005 % ophthalmic solution 1 drop  1 drop Both Eyes QHS Maryagnes Amos, FNP   1 drop at 10/27/23 2128   magnesium hydroxide (MILK OF MAGNESIA) suspension 30 mL  30 mL Oral Daily PRN Maryagnes Amos, FNP   30 mL at 10/12/23 1718   melatonin tablet 5 mg  5 mg Oral QHS Lewanda Rife, MD   5 mg at 10/27/23 2126   menthol-cetylpyridinium (CEPACOL) lozenge 3 mg  1 lozenge Oral PRN Lewanda Rife, MD   3 mg at 10/28/23 0829    mirtazapine (REMERON) tablet 30 mg  30 mg Oral QHS Lewanda Rife, MD   30 mg at 10/27/23 2126   mometasone-formoterol (DULERA) 200-5 MCG/ACT inhaler 2 puff  2 puff Inhalation BID Maryagnes Amos, FNP   2 puff at 10/28/23 1025   OLANZapine zydis (ZYPREXA) disintegrating tablet 5 mg  5 mg Oral TID PRN Maryagnes Amos, FNP       pantoprazole (PROTONIX) EC tablet 20 mg  20 mg Oral Daily Maryagnes Amos, FNP   20 mg at 10/28/23 1025   senna-docusate (Senokot-S) tablet 1 tablet  1 tablet Oral QHS PRN Maryagnes Amos, FNP   1 tablet at 10/13/23 2107    Lab Results: No results found for this or any previous visit (from the past 48 hours).  Blood Alcohol level:  Lab Results  Component Value Date   ETH <10 10/10/2023   ETH <10 09/15/2023    Metabolic Disorder Labs: Lab Results  Component Value Date   HGBA1C 5.1 09/15/2023   MPG 99.67 09/15/2023   MPG 103 01/13/2023   No results found for: "PROLACTIN" Lab Results  Component Value Date   CHOL 159 09/15/2023   TRIG 53 09/15/2023   HDL 61 09/15/2023   CHOLHDL 2.6 09/15/2023   VLDL 11 09/15/2023   LDLCALC 87 09/15/2023   LDLCALC 99 01/13/2023      Musculoskeletal: Strength & Muscle Tone: within normal limits Gait & Station: normal Patient leans: N/A   Psychiatric Specialty Exam:   Presentation  General Appearance:  Appropriate for Environment   Eye Contact: Fair   Speech: Normal Rate   Speech Volume: Normal   Handedness: Right     Mood and Affect  Mood: Depressed   Affect: Constricted     Thought Process  Thought Processes: Coherent   Descriptions of Associations:Intact   Orientation:Full (  Time, Place and Person)   Thought Content: Positive for suicide thoughts   History of Schizophrenia/Schizoaffective disorder:No   Duration of Psychotic Symptoms:N/A   Hallucinations:Hallucinations: None   Ideas of Reference:None   Suicidal Thoughts: Reports SI  Homicidal  Thoughts: Denies     Sensorium  Memory: Immediate Good; Recent Good; Remote Good   Judgment: Poor   Insight: Fair     Chartered certified accountant: Fair   Attention Span: Fair   Recall: [employer] of Knowledge: Fair   Language: Fair     Psychomotor Activity  Psychomotor Activity: Psychomotor Activity: Normal     Assets  Assets: Social Support; Desire for Improvement     Sleep  Sleep: Improved       Physical Exam: Physical Exam Constitutional:      Appearance: Normal appearance.  HENT:     Head: Normocephalic and atraumatic.     Nose: Nose normal.  Eyes:     Pupils: Pupils are equal, round, and reactive to light.  Cardiovascular:     Rate and Rhythm: Regular rhythm.  Pulmonary:     Effort: Pulmonary effort is normal.  Skin:    General: Skin is warm.  Neurological:     General: No focal deficit present.     Mental Status: He is alert and oriented to person, place, and time.      Review of Systems  Constitutional:  Negative for chills and fever.  HENT:  Negative for hearing loss and sore throat.   Eyes:  Negative for blurred vision and double vision.  Respiratory:  Negative for cough and shortness of breath.   Cardiovascular:  Negative for chest pain.  Gastrointestinal:  Negative for nausea and vomiting.  Neurological:  Negative for dizziness and speech change.  .    Blood pressure (!) 144/88, pulse 67, temperature (!) 97.1 F (36.2 C), resp. rate 15, height 6\' 4"  (1.93 m), weight 112.5 kg, SpO2 100%. Body mass index is 30.19 kg/m.   Treatment Plan Summary: Daily contact with patient to assess and evaluate symptoms and progress in treatment and Medication management Patient is admitted to locked unit under safety precautions Patient has been on Abilify in the past, at this time patient is denying any psychotic symptoms will defer use of Abilify for now Increase the dose of Remeron to 30 mg at bedtime for depression and insomnia  10/17/23 Patient was started on Abilify 15 mg on 10/21/2023 Patient was encouraged to abstain from alcohol and drug use Patient was encouraged to attend group and work on coping strategies Social worker consulted to get collateral and help with a safe discharge plan. started on melatonin 5 mg at bedtime for insomnia 10/17/2023  Lewanda Rife, MD

## 2023-10-28 NOTE — Group Note (Signed)
Recreation Therapy Group Note   Group Topic:Emotion Expression  Group Date: 10/28/2023 Start Time: 1500 End Time: 1600 Facilitators: Rosina Lowenstein, LRT, CTRS Location:  Dayroom  Group Description: Positivity Collage. LRT and patients discussed the importance of having a positive mindset and being happy. Patients received magazines, safety scissors, a glue stick and a piece of paper. Pts were encouraged to find images or words in the magazines that showed "happiness" or positivity to them. Pt shared their collage with the group once they were finished. LRT and pts discussed how it can be difficult to always have a positive mindset, especially when they have mental health challenges.   Goal Area(s) Addressed:  Pt will identify things associate with positivity. Pt will reduce negative thinking. Pt will identify a new coping skill of thinking positive thoughts.    Affect/Mood: N/A   Participation Level: Did not attend    Clinical Observations/Individualized Feedback: Robert Chan did not attend group.   Plan: Continue to engage patient in RT group sessions 2-3x/week.   Rosina Lowenstein, LRT, CTRS 10/28/2023 4:58 PM

## 2023-10-28 NOTE — Progress Notes (Signed)
 Patient cooperative and pleasant.  Continues to have cough and congestion. Laying in bed after breakfast. Dr. Marval Regal aware.  Endorses sadness and depression.  Denies SI/HI and AVH.  Denies pain.  Reports poor sleep.   Compliant with scheduled medications.  PRN medication given for cough. 15 min checks in place for safety.  Patient with appropriate interaction when in milieu.    Mucinex scheduled BID.

## 2023-10-28 NOTE — Group Note (Signed)
Recreation Therapy Group Note   Group Topic:General Recreation  Group Date: 10/28/2023 Start Time: 1100 End Time: 1150 Facilitators: Rosina Lowenstein, LRT, CTRS Location:  Dayroom  Group Description: General Recreation. Patients were given the opportunity to play cards, journal, or sing karaoke during group. Pt identified and conversated about things they enjoy doing in their free time and how they can continue to do that outside of the hospital.  Goal Area(s) Addressed: Patient will practice making a positive decision. Patient will have the opportunity to try a new leisure activity. Patient will communicate with peers and LRT.   Affect/Mood: N/A   Participation Level: Did not attend    Clinical Observations/Individualized Feedback: Patient did not attend group.   Plan: Continue to engage patient in RT group sessions 2-3x/week.   Rosina Lowenstein, LRT, CTRS 10/28/2023 1:30 PM

## 2023-10-28 NOTE — Progress Notes (Signed)
Assumed care of patient last evening,he presented A&O x3 , affect & depressed, otherwise appropriate to situation and was with complaints of his medication is making him feel very sleepy throughout the day. Pt denied thoughts, plan or intent to harm self or others and made a safety commitment,  did did endorse A/H but stated they are less bothersome.. Pt is compliant with taking medications and following his plan of care  Pt educated on plan of care, medication regimen and he acknowledged an understanding and was without further complaints or concerns. Pt was maintained on q 15 min rounds for safety and support.

## 2023-10-28 NOTE — Group Note (Signed)
Date:  10/28/2023 Time:  12:32 PM  Group Topic/Focus:  Goals Group:   The focus of this group is to help patients establish daily goals to achieve during treatment and discuss how the patient can incorporate goal setting into their daily lives to aide in recovery. Healthy Communication:   The focus of this group is to discuss communication, barriers to communication, as well as healthy ways to communicate with others.    Participation Level:  Did Not Attend   Robert Chan 10/28/2023, 12:32 PM

## 2023-10-28 NOTE — Plan of Care (Signed)
  Problem: Education: Goal: Knowledge of General Education information will improve Description: Including pain rating scale, medication(s)/side effects and non-pharmacologic comfort measures Outcome: Progressing   Problem: Activity: Goal: Risk for activity intolerance will decrease Outcome: Progressing   Problem: Nutrition: Goal: Adequate nutrition will be maintained Outcome: Progressing   

## 2023-10-29 DIAGNOSIS — F332 Major depressive disorder, recurrent severe without psychotic features: Secondary | ICD-10-CM | POA: Diagnosis not present

## 2023-10-29 DIAGNOSIS — F1410 Cocaine abuse, uncomplicated: Secondary | ICD-10-CM | POA: Diagnosis not present

## 2023-10-29 NOTE — Progress Notes (Signed)
[facility] MD Progress Note  10/29/2023  Robert Chan  MRN:  [id number]  Robert Chan is a 67 y.o. male who was initially admitted medically for 10/10/2023  for 2 days of chest pain and week of SI/HI. He carries the psychiatric diagnoses of MDD, severe, recurrent, without psychosis and has a past medical history of  atherosclerotic CAD, COPD, MDD without psychosis, hypertension, hx of cocaine use, history of NSTEMI 06/2020, underwent Left cardiac cath showed 30% prox LAD stenosis and 20% mid RCA stenosis .   Subjective:  Chart reviewed, patient's case discussed in multidisciplinary meeting, patient seen during rounds.  Not much change in mental status since yesterday.  On the patient's hygiene has improved.  He took shower yesterday. Patient continues to report" Ok" mood today.  Patient reports suicidal ideations, denies any intention to harm himself on the unit.  He denies auditory or  visual hallucinations.  Patient has been attending groups and working on coping strategies. Patient reports he took shower yesterday.   Principal Problem: MDD (major depressive disorder), recurrent episode, severe (HCC) Diagnosis: Principal Problem:   MDD (major depressive disorder), recurrent episode, severe (HCC) Active Problems:   Cocaine abuse (HCC)    Past Psychiatric History: Patient has a long history of depression and cocaine use. Patient was admitted to this facility in October. 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   Depression    Elevated serum creatinine 08/28/2019   Homelessness 06/19/2020   Homicidal ideations 05/25/2023   Hypertension    Major  depressive disorder, recurrent episode, 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/11/2022   Major depressive disorder, single episode, 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/10/2022   MDD (major depressive disorder), recurrent severe, 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   NSTEMI (non-ST elevated myocardial infarction) (HCC)    Schizoaffective disorder (HCC) 11/08/2022   Status post incision and drainage 04/22/2021   Suicidal ideation 06/05/2020   Suicidal ideation 06/05/2020    Past Surgical History:  Procedure Laterality Date   APPENDECTOMY     EYE SURGERY     LEFT HEART CATH AND CORONARY ANGIOGRAPHY N/A 06/05/2020   Procedure: LEFT HEART CATH AND CORONARY ANGIOGRAPHY;  Surgeon: Elder Negus, MD;  Location: [facility];  Service: Cardiovascular;  Laterality: N/A;   PROSTATE SURGERY     Family History:  Family History  Problem Relation Age of Onset   Hypertension Mother    Diabetes Mother    Hypertension Father    Hypertension Sister    Family Psychiatric  History: Patient reports history brothers committed suicide by overdose on drugs  Social History:  Social History   Substance and Sexual Activity  Alcohol Use Yes   Comment: 4 40oz beer a week     Social History   Substance and Sexual Activity  Drug Use Yes   Types: Cocaine   Comment: cocaine use once a month, last use 6 mos ago    Social History   Socioeconomic History   Marital status: Single    Spouse name: Not on file   Number of children: Not on file   Years of education: Not on file  Highest education level: Not on file  Occupational History   Not on file  Tobacco Use   Smoking status: Some Days    Current packs/day: 0.00    Types: Cigarettes    Last attempt to quit: 12/25/2020    Years since quitting: 2.8   Smokeless tobacco: Never   Tobacco comments:    Smokes a couple cigarettes every now and then  Vaping Use   Vaping status: Never Used  Substance and Sexual Activity   Alcohol use: Yes    Comment: 4 40oz beer a week   Drug use:  Yes    Types: Cocaine    Comment: cocaine use once a month, last use 6 mos ago   Sexual activity: Not Currently  Other Topics Concern   Not on file  Social History Narrative   His mother passed at age 50 (when he was 23 years old) and his father raised him and his siblings   His parents were pastors as a lot of his sisters and other family members are today   There was a total of 13 children in his family Had 39 sisters, one sister deceased , Had 3 brothers, all brothers deceased   He is the youngest of the 59 and was "always in trouble in my younger years"   Family still live in [place], [place], some in [place], [place]   Has a Daughter and son with a total of 10 grandchildren (6 grand daughters local and 4 grandsons)    Values family   Married twice, present wife incarcerated   Social Drivers of Corporate investment banker Strain: Low Risk  (10/19/2022)   Overall Financial Resource Strain (CARDIA)    Difficulty of Paying Living Expenses: Not hard at all  Food Insecurity: No Food Insecurity (10/11/2023)   Hunger Vital Sign    Worried About Running Out of Food in the Last Year: Never true    Ran Out of Food in the Last Year: Never true  Recent Concern: Food Insecurity - Food Insecurity Present (09/16/2023)   Hunger Vital Sign    Worried About Running Out of Food in the Last Year: Sometimes true    Ran Out of Food in the Last Year: Sometimes true  Transportation Needs: No Transportation Needs (10/11/2023)   PRAPARE - Administrator, Civil Service (Medical): No    Lack of Transportation (Non-Medical): No  Recent Concern: Transportation Needs - Unmet Transportation Needs (10/10/2023)   PRAPARE - Transportation    Lack of Transportation (Medical): Yes    Lack of Transportation (Non-Medical): Yes  Physical Activity: Inactive (10/19/2022)   Exercise Vital Sign    Days of Exercise per Week: 0 days    Minutes of Exercise per Session: 0 min  Stress: No Stress Concern  Present (10/19/2022)   [name] of Occupational Health - Occupational Stress Questionnaire    Feeling of Stress : Not at all  Social Connections: Moderately Integrated (03/31/2022)   Social Connection and Isolation Panel [NHANES]    Frequency of Communication with Friends and Family: Three times a week    Frequency of Social Gatherings with Friends and Family: Three times a week    Attends Religious Services: 1 to 4 times per year    Active Member of Clubs or Organizations: Yes    Attends Banker Meetings: 1 to 4 times per year    Marital Status: Never married  Sleep: Fair  Appetite:  Fair  Current Medications: Current Facility-Administered Medications  Medication Dose Route Frequency Provider Last Rate Last Admin   acetaminophen (TYLENOL) tablet 650 mg  650 mg Oral Q6H PRN Maryagnes Amos, FNP   650 mg at 10/19/23 1139   alum & mag hydroxide-simeth (MAALOX/MYLANTA) 200-200-20 MG/5ML suspension 30 mL  30 mL Oral Q4H PRN Maryagnes Amos, FNP       ARIPiprazole (ABILIFY) tablet 15 mg  15 mg Oral QPC breakfast [name], DO   15 mg at 10/29/23 1610   atorvastatin (LIPITOR) tablet 80 mg  80 mg Oral Daily Maryagnes Amos, FNP   80 mg at 10/29/23 0939   diltiazem (CARDIZEM CD) 24 hr capsule 180 mg  180 mg Oral Daily Lewanda Rife, MD   180 mg at 10/29/23 9604   docusate sodium (COLACE) capsule 100 mg  100 mg Oral BID Lewanda Rife, MD   100 mg at 10/29/23 0939   guaiFENesin (MUCINEX) 12 hr tablet 600 mg  600 mg Oral BID Lewanda Rife, MD   600 mg at 10/29/23 0939   latanoprost (XALATAN) 0.005 % ophthalmic solution 1 drop  1 drop Both Eyes QHS Maryagnes Amos, FNP   1 drop at 10/28/23 2106   magnesium hydroxide (MILK OF MAGNESIA) suspension 30 mL  30 mL Oral Daily PRN Maryagnes Amos, FNP   30 mL at 10/12/23 1718   melatonin tablet 5 mg  5 mg Oral QHS Lewanda Rife, MD   5 mg  at 10/28/23 2132   menthol-cetylpyridinium (CEPACOL) lozenge 3 mg  1 lozenge Oral PRN Lewanda Rife, MD   3 mg at 10/28/23 0829   mirtazapine (REMERON) tablet 30 mg  30 mg Oral QHS Lewanda Rife, MD   30 mg at 10/28/23 2107   mometasone-formoterol (DULERA) 200-5 MCG/ACT inhaler 2 puff  2 puff Inhalation BID Maryagnes Amos, FNP   2 puff at 10/29/23 0938   OLANZapine zydis (ZYPREXA) disintegrating tablet 5 mg  5 mg Oral TID PRN Maryagnes Amos, FNP       pantoprazole (PROTONIX) EC tablet 20 mg  20 mg Oral Daily Maryagnes Amos, FNP   20 mg at 10/29/23 5409   senna-docusate (Senokot-S) tablet 1 tablet  1 tablet Oral QHS PRN Maryagnes Amos, FNP   1 tablet at 10/13/23 2107    Lab Results: No results found for this or any previous visit (from the past 48 hours).  Blood Alcohol level:  Lab Results  Component Value Date   ETH <10 10/10/2023   ETH <10 09/15/2023    Metabolic Disorder Labs: Lab Results  Component Value Date   HGBA1C 5.1 09/15/2023   MPG 99.67 09/15/2023   MPG 103 01/13/2023   No results found for: "PROLACTIN" Lab Results  Component Value Date   CHOL 159 09/15/2023   TRIG 53 09/15/2023   HDL 61 09/15/2023   CHOLHDL 2.6 09/15/2023   VLDL 11 09/15/2023   LDLCALC 87 09/15/2023   LDLCALC 99 01/13/2023      Musculoskeletal: Strength & Muscle Tone: within normal limits Gait & Station: normal Patient leans: N/A   Psychiatric Specialty Exam:   Presentation  General Appearance:  Appropriate for Environment   Eye Contact: Fair   Speech: Normal Rate   Speech Volume: Normal   Handedness: Right     Mood and Affect  Mood: Depressed   Affect: Constricted     Thought Process  Thought Processes: Coherent  Descriptions of Associations:Intact   Orientation:Full (Time, Place and Person)   Thought Content: Positive for suicide thoughts   History of Schizophrenia/Schizoaffective disorder:No   Duration of  Psychotic Symptoms:N/A   Hallucinations:Hallucinations: None   Ideas of Reference:None   Suicidal Thoughts: Reports SI  Homicidal Thoughts: Denies     Sensorium  Memory: Immediate Good; Recent Good; Remote Good   Judgment: Poor   Insight: Fair     Chartered certified accountant: Fair   Attention Span: Fair   Recall: [employer] of Knowledge: Fair   Language: Fair     Psychomotor Activity  Psychomotor Activity: Psychomotor Activity: Normal     Assets  Assets: Social Support; Desire for Improvement     Sleep  Sleep: Improved       Physical Exam: Physical Exam Constitutional:      Appearance: Normal appearance.  HENT:     Head: Normocephalic and atraumatic.     Nose: Nose normal.  Eyes:     Pupils: Pupils are equal, round, and reactive to light.  Cardiovascular:     Rate and Rhythm: Regular rhythm.  Pulmonary:     Effort: Pulmonary effort is normal.  Skin:    General: Skin is warm.  Neurological:     General: No focal deficit present.     Mental Status: He is alert and oriented to person, place, and time.      Review of Systems  Constitutional:  Negative for chills and fever.  HENT:  Negative for hearing loss and sore throat.   Eyes:  Negative for blurred vision and double vision.  Respiratory:  Negative for cough and shortness of breath.   Cardiovascular:  Negative for chest pain.  Gastrointestinal:  Negative for nausea and vomiting.  Neurological:  Negative for dizziness and speech change.  .    Blood pressure 128/76, pulse 81, temperature 98.5 F (36.9 C), resp. rate 16, height 6\' 4"  (1.93 m), weight 112.5 kg, SpO2 100%. Body mass index is 30.19 kg/m.   Treatment Plan Summary: Daily contact with patient to assess and evaluate symptoms and progress in treatment and Medication management Patient is admitted to locked unit under safety precautions Patient has been on Abilify in the past, at this time patient is denying any  psychotic symptoms will defer use of Abilify for now Increase the dose of Remeron to 30 mg at bedtime for depression and insomnia 10/17/23 Patient was started on Abilify 15 mg on 10/21/2023 Patient was encouraged to abstain from alcohol and drug use Patient was encouraged to attend group and work on coping strategies Social worker consulted to get collateral and help with a safe discharge plan. started on melatonin 5 mg at bedtime for insomnia 10/17/2023  Lewanda Rife, MD

## 2023-10-29 NOTE — Group Note (Signed)
Date:  10/29/2023 Time:  8:44 PM  Group Topic/Focus:  Personal Choices and Values:   The focus of this group is to help patients assess and explore the importance of values in their lives, how their values affect their decisions, how they express their values and what opposes their expression.    Participation Level:  Active  Participation Quality:  Appropriate  Affect:  Appropriate  Cognitive:  Appropriate  Insight: Good  Engagement in Group:  Engaged  Modes of Intervention:  Discussion  Additional Comments:    Burt Ek 10/29/2023, 8:44 PM

## 2023-10-29 NOTE — Plan of Care (Signed)
  Problem: Education: Goal: Knowledge of General Education information will improve Description Including pain rating scale, medication(s)/side effects and non-pharmacologic comfort measures Outcome: Progressing   Problem: Health Behavior/Discharge Planning: Goal: Ability to manage health-related needs will improve Outcome: Progressing   

## 2023-10-29 NOTE — Progress Notes (Signed)
   10/29/23 0900  Psych Admission Type (Psych Patients Only)  Admission Status Voluntary  Psychosocial Assessment  Patient Complaints Depression  Eye Contact Fair  Facial Expression Sad  Affect Sad  Speech Logical/coherent  Interaction Assertive  Motor Activity Other (Comment) (WNL)  Appearance/Hygiene Unremarkable  Behavior Characteristics Cooperative  Mood Pleasant  Thought Process  Coherency WDL  Content WDL  Delusions None reported or observed  Perception WDL  Hallucination None reported or observed  Judgment WDL  Confusion None  Danger to Self  Current suicidal ideation? Denies  Agreement Not to Harm Self Yes  Description of Agreement verbal  Danger to Others  Danger to Others None reported or observed

## 2023-10-29 NOTE — Group Note (Signed)
LCSW Group Therapy Note  Group Date: 10/29/2023 Start Time: 1300 End Time: 1340   Type of Therapy and Topic:  Group Therapy - Healthy vs Unhealthy Coping Skills  Participation Level:  Patient Participation was Minimal   Description of Group The focus of this group was to determine what unhealthy coping techniques typically are used by group members and what healthy coping techniques would be helpful in coping with various problems. Patients were guided in becoming aware of the differences between healthy and unhealthy coping techniques. Patients were asked to identify 2-3 healthy coping skills they would like to learn to use more effectively.  Therapeutic Goals Patients learned that coping is what human beings do all day long to deal with various situations in their lives Patients defined and discussed healthy vs unhealthy coping techniques Patients identified their preferred coping techniques and identified whether these were healthy or unhealthy Patients determined 2-3 healthy coping skills they would like to become more familiar with and use more often. Patients provided support and ideas to each other   Summary of Patient Progress:  During group, Robert Chan expressed using distraction in the form of watching sports as a Associate Professor. Patient proved open to input from peers and feedback from CSW. Patient demonstrated some insight into the subject matter, was respectful of peers, and participated throughout the entire session.   Therapeutic Modalities Cognitive Behavioral Therapy Motivational Interviewing  Robert Chan, LCSWA 10/29/2023  1:58 PM

## 2023-10-29 NOTE — Group Note (Signed)
Date:  10/29/2023 Time:  4:24 PM  Group Topic/Focus:  Healthy Communication:   The focus of this group is to discuss communication, barriers to communication, as well as healthy ways to communicate with others. Self Care:   The focus of this group is to help patients understand the importance of self-care in order to improve or restore emotional, physical, spiritual, interpersonal, and financial health.    Participation Level:  Active  Participation Quality:  Appropriate  Affect:    Cognitive:  Appropriate  Insight: Appropriate  Engagement in Group:  Engaged  Modes of Intervention:  Activity  Additional Comments:    Akosua Constantine 10/29/2023, 4:24 PM

## 2023-10-29 NOTE — Group Note (Signed)
Date:  10/29/2023 Time:  1:15 AM  Group Topic/Focus:  Wrap-Up Group:   The focus of this group is to help patients review their daily goal of treatment and discuss progress on daily workbooks.    Participation Level:  Active  Participation Quality:  Appropriate  Affect:  Appropriate  Cognitive:  Alert  Insight: Good  Engagement in Group:  Engaged  Modes of Intervention:  Discussion  Additional Comments:    [name] 10/29/2023, 1:15 AM

## 2023-10-29 NOTE — Progress Notes (Signed)
Patient was cooperative with treatment and medications, pleasant on approach, visible in the dayroom during the evening, no new behavioral issues to report on shift at this time.

## 2023-10-30 DIAGNOSIS — F332 Major depressive disorder, recurrent severe without psychotic features: Secondary | ICD-10-CM | POA: Diagnosis not present

## 2023-10-30 DIAGNOSIS — F1410 Cocaine abuse, uncomplicated: Secondary | ICD-10-CM | POA: Diagnosis not present

## 2023-10-30 NOTE — Progress Notes (Signed)
[facility] MD Progress Note  10/30/2023  Robert Chan  MRN:  [id number]  Robert Chan is a 67 y.o. male who was initially admitted medically for 10/10/2023  for 2 days of chest pain and week of SI/HI. He carries the psychiatric diagnoses of MDD, severe, recurrent, without psychosis and has a past medical history of  atherosclerotic CAD, COPD, MDD without psychosis, hypertension, hx of cocaine use, history of NSTEMI 06/2020, underwent Left cardiac cath showed 30% prox LAD stenosis and 20% mid RCA stenosis .   Subjective:  Chart reviewed, patient's case discussed in multidisciplinary meeting, patient seen during rounds.  Not much change in mental status since yesterday.  On the patient's hygiene has improved.  He took shower yesterday. Patient continues to report" Ok" mood today.  Patient continues to report suicidal ideations, denies any intention to harm himself on the unit.  He denies auditory or  visual hallucinations.  Patient has been attending groups and working on coping strategies.   Principal Problem: MDD (major depressive disorder), recurrent episode, severe (HCC) Diagnosis: Principal Problem:   MDD (major depressive disorder), recurrent episode, severe (HCC) Active Problems:   Cocaine abuse (HCC)    Past Psychiatric History: Patient has a long history of depression and cocaine use. Patient was admitted to this facility in October. 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   Depression    Elevated serum creatinine 08/28/2019   Homelessness 06/19/2020   Homicidal ideations 05/25/2023   Hypertension    Major depressive disorder, recurrent  episode, 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/11/2022   Major depressive disorder, single episode, 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/10/2022   MDD (major depressive disorder), recurrent severe, 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   NSTEMI (non-ST elevated myocardial infarction) (HCC)    Schizoaffective disorder (HCC) 11/08/2022   Status post incision and drainage 04/22/2021   Suicidal ideation 06/05/2020   Suicidal ideation 06/05/2020    Past Surgical History:  Procedure Laterality Date   APPENDECTOMY     EYE SURGERY     LEFT HEART CATH AND CORONARY ANGIOGRAPHY N/A 06/05/2020   Procedure: LEFT HEART CATH AND CORONARY ANGIOGRAPHY;  Surgeon: Elder Negus, MD;  Location: [facility] INVASIVE CV [facility];  Service: Cardiovascular;  Laterality: N/A;   PROSTATE SURGERY     Family History:  Family History  Problem Relation Age of Onset   Hypertension Mother    Diabetes Mother    Hypertension Father    Hypertension Sister    Family Psychiatric  History: Patient reports history brothers committed suicide by overdose on drugs  Social History:  Social History   Substance and Sexual Activity  Alcohol Use Yes   Comment: 4 40oz beer a week     Social History   Substance and Sexual Activity  Drug Use Yes   Types: Cocaine   Comment: cocaine use once a month, last use 6 mos ago    Social History   Socioeconomic History   Marital status: Single    Spouse name: Not on file   Number of children: Not on file   Years of education: Not on file   Highest education level: Not  on file  Occupational History   Not on file  Tobacco Use   Smoking status: Some Days    Current packs/day: 0.00    Types: Cigarettes    Last attempt to quit: 12/25/2020    Years since quitting: 2.8   Smokeless tobacco: Never   Tobacco comments:    Smokes a couple cigarettes every now and then  Vaping Use   Vaping status: Never Used  Substance and Sexual Activity   Alcohol use: Yes    Comment: 4 40oz beer a week   Drug use: Yes    Types: Cocaine     Comment: cocaine use once a month, last use 6 mos ago   Sexual activity: Not Currently  Other Topics Concern   Not on file  Social History Narrative   His mother passed at age 16 (when he was 25 years old) and his father raised him and his siblings   His parents were pastors as a lot of his sisters and other family members are today   There was a total of 13 children in his family Had 68 sisters, one sister deceased , Had 3 brothers, all brothers deceased   He is the youngest of the 5 and was "always in trouble in my younger years"   Family still live in [place], [place], some in [place], [place]   Has a Daughter and son with a total of 10 grandchildren (6 grand daughters local and 4 grandsons)    Values family   Married twice, present wife incarcerated   Social Drivers of Corporate investment banker Strain: Low Risk  (10/19/2022)   Overall Financial Resource Strain (CARDIA)    Difficulty of Paying Living Expenses: Not hard at all  Food Insecurity: No Food Insecurity (10/11/2023)   Hunger Vital Sign    Worried About Running Out of Food in the Last Year: Never true    Ran Out of Food in the Last Year: Never true  Recent Concern: Food Insecurity - Food Insecurity Present (09/16/2023)   Hunger Vital Sign    Worried About Running Out of Food in the Last Year: Sometimes true    Ran Out of Food in the Last Year: Sometimes true  Transportation Needs: No Transportation Needs (10/11/2023)   PRAPARE - Administrator, Civil Service (Medical): No    Lack of Transportation (Non-Medical): No  Recent Concern: Transportation Needs - Unmet Transportation Needs (10/10/2023)   PRAPARE - Transportation    Lack of Transportation (Medical): Yes    Lack of Transportation (Non-Medical): Yes  Physical Activity: Inactive (10/19/2022)   Exercise Vital Sign    Days of Exercise per Week: 0 days    Minutes of Exercise per Session: 0 min  Stress: No Stress Concern Present (10/19/2022)   [employer] of Occupational Health - Occupational Stress Questionnaire    Feeling of Stress : Not at all  Social Connections: Moderately Integrated (03/31/2022)   Social Connection and Isolation Panel [NHANES]    Frequency of Communication with Friends and Family: Three times a week    Frequency of Social Gatherings with Friends and Family: Three times a week    Attends Religious Services: 1 to 4 times per year    Active Member of Clubs or Organizations: Yes    Attends Banker Meetings: 1 to 4 times per year    Marital Status: Never married  Sleep: Fair  Appetite:  Fair  Current Medications: Current Facility-Administered Medications  Medication Dose Route Frequency Provider Last Rate Last Admin   acetaminophen (TYLENOL) tablet 650 mg  650 mg Oral Q6H PRN Maryagnes Amos, FNP   650 mg at 10/19/23 1139   alum & mag hydroxide-simeth (MAALOX/MYLANTA) 200-200-20 MG/5ML suspension 30 mL  30 mL Oral Q4H PRN Maryagnes Amos, FNP       ARIPiprazole (ABILIFY) tablet 15 mg  15 mg Oral QPC breakfast [name], DO   15 mg at 10/30/23 1004   atorvastatin (LIPITOR) tablet 80 mg  80 mg Oral Daily Maryagnes Amos, FNP   80 mg at 10/30/23 1004   diltiazem (CARDIZEM CD) 24 hr capsule 180 mg  180 mg Oral Daily Lewanda Rife, MD   180 mg at 10/30/23 1004   docusate sodium (COLACE) capsule 100 mg  100 mg Oral BID Lewanda Rife, MD   100 mg at 10/30/23 1003   guaiFENesin (MUCINEX) 12 hr tablet 600 mg  600 mg Oral BID Lewanda Rife, MD   600 mg at 10/30/23 1004   latanoprost (XALATAN) 0.005 % ophthalmic solution 1 drop  1 drop Both Eyes QHS Maryagnes Amos, FNP   1 drop at 10/29/23 2134   magnesium hydroxide (MILK OF MAGNESIA) suspension 30 mL  30 mL Oral Daily PRN Maryagnes Amos, FNP   30 mL at 10/12/23 1718   melatonin tablet 5 mg  5 mg Oral QHS Lewanda Rife, MD   5 mg at 10/29/23 2137    menthol-cetylpyridinium (CEPACOL) lozenge 3 mg  1 lozenge Oral PRN Lewanda Rife, MD   3 mg at 10/30/23 1006   mirtazapine (REMERON) tablet 30 mg  30 mg Oral QHS Lewanda Rife, MD   30 mg at 10/29/23 2133   mometasone-formoterol (DULERA) 200-5 MCG/ACT inhaler 2 puff  2 puff Inhalation BID Maryagnes Amos, FNP   2 puff at 10/30/23 1003   OLANZapine zydis (ZYPREXA) disintegrating tablet 5 mg  5 mg Oral TID PRN Maryagnes Amos, FNP       pantoprazole (PROTONIX) EC tablet 20 mg  20 mg Oral Daily Maryagnes Amos, FNP   20 mg at 10/30/23 1004   senna-docusate (Senokot-S) tablet 1 tablet  1 tablet Oral QHS PRN Maryagnes Amos, FNP   1 tablet at 10/13/23 2107    Lab Results: No results found for this or any previous visit (from the past 48 hours).  Blood Alcohol level:  Lab Results  Component Value Date   ETH <10 10/10/2023   ETH <10 09/15/2023    Metabolic Disorder Labs: Lab Results  Component Value Date   HGBA1C 5.1 09/15/2023   MPG 99.67 09/15/2023   MPG 103 01/13/2023   No results found for: "PROLACTIN" Lab Results  Component Value Date   CHOL 159 09/15/2023   TRIG 53 09/15/2023   HDL 61 09/15/2023   CHOLHDL 2.6 09/15/2023   VLDL 11 09/15/2023   LDLCALC 87 09/15/2023   LDLCALC 99 01/13/2023      Musculoskeletal: Strength & Muscle Tone: within normal limits Gait & Station: normal Patient leans: N/A   Psychiatric Specialty Exam:   Presentation  General Appearance:  Appropriate for Environment   Eye Contact: Fair   Speech: Normal Rate   Speech Volume: Normal   Handedness: Right     Mood and Affect  Mood: Depressed   Affect: Constricted     Thought Process  Thought Processes: Coherent  Descriptions of Associations:Intact   Orientation:Full (Time, Place and Person)   Thought Content: Positive for suicide thoughts, denies any intentions or plans   History of Schizophrenia/Schizoaffective disorder:No    Duration of Psychotic Symptoms:N/A   Hallucinations:Hallucinations: None   Ideas of Reference:None   Suicidal Thoughts: Reports SI  Homicidal Thoughts: Denies     Sensorium  Memory: Immediate Good; Recent Good; Remote Good   Judgment: Limited   Insight: Fair     Chartered certified accountant: Fair   Attention Span: Fair   Recall: [employer] of Knowledge: Fair   Language: Fair     Psychomotor Activity  Psychomotor Activity: Psychomotor Activity: Normal     Assets  Assets: Social Support; Desire for Improvement     Sleep  Sleep: Improved       Physical Exam: Physical Exam Constitutional:      Appearance: Normal appearance.  HENT:     Head: Normocephalic and atraumatic.     Nose: Nose normal.  Eyes:     Pupils: Pupils are equal, round, and reactive to light.  Cardiovascular:     Rate and Rhythm: Regular rhythm.  Pulmonary:     Effort: Pulmonary effort is normal.  Skin:    General: Skin is warm.  Neurological:     General: No focal deficit present.     Mental Status: He is alert and oriented to person, place, and time.      Review of Systems  Constitutional:  Negative for chills and fever.  HENT:  Negative for hearing loss and sore throat.   Eyes:  Negative for blurred vision and double vision.  Respiratory:  Negative for cough and shortness of breath.   Cardiovascular:  Negative for chest pain.  Gastrointestinal:  Negative for nausea and vomiting.  Neurological:  Negative for dizziness and speech change.  .    Blood pressure (!) 147/92, pulse 81, temperature 98.3 F (36.8 C), resp. rate 18, height 6\' 4"  (1.93 m), weight 112.5 kg, SpO2 100%. Body mass index is 30.19 kg/m.   Treatment Plan Summary: Daily contact with patient to assess and evaluate symptoms and progress in treatment and Medication management Patient is admitted to locked unit under safety precautions Patient has been on Abilify in the past, at this time  patient is denying any psychotic symptoms will defer use of Abilify for now Increase the dose of Remeron to 30 mg at bedtime for depression and insomnia 10/17/23 Patient was started on Abilify 15 mg on 10/21/2023 Patient was encouraged to abstain from alcohol and drug use Patient was encouraged to attend group and work on coping strategies Social worker consulted to get collateral and help with a safe discharge plan. started on melatonin 5 mg at bedtime for insomnia 10/17/2023  Lewanda Rife, MD

## 2023-10-30 NOTE — Plan of Care (Signed)
  Problem: Education: Goal: Knowledge of General Education information will improve Description: Including pain rating scale, medication(s)/side effects and non-pharmacologic comfort measures Outcome: Progressing   Problem: Health Behavior/Discharge Planning: Goal: Ability to manage health-related needs will improve Outcome: Progressing   Problem: Clinical Measurements: Goal: Ability to maintain clinical measurements within normal limits will improve Outcome: Progressing Goal: Will remain free from infection Outcome: Progressing Goal: Diagnostic test results will improve Outcome: Progressing Goal: Respiratory complications will improve Outcome: Progressing Goal: Cardiovascular complication will be avoided Outcome: Progressing   Problem: Activity: Goal: Risk for activity intolerance will decrease Outcome: Progressing   Problem: Nutrition: Goal: Adequate nutrition will be maintained Outcome: Progressing   Problem: Coping: Goal: Level of anxiety will decrease Outcome: Progressing   Problem: Elimination: Goal: Will not experience complications related to bowel motility Outcome: Progressing Goal: Will not experience complications related to urinary retention Outcome: Progressing   Problem: Pain Management: Goal: General experience of comfort will improve Outcome: Progressing   Problem: Safety: Goal: Ability to remain free from injury will improve Outcome: Progressing   Problem: Skin Integrity: Goal: Risk for impaired skin integrity will decrease Outcome: Progressing   Problem: Education: Goal: Utilization of techniques to improve thought processes will improve Outcome: Progressing

## 2023-10-30 NOTE — Plan of Care (Signed)
  Problem: Activity: Goal: Risk for activity intolerance will decrease Outcome: Progressing   Problem: Nutrition: Goal: Adequate nutrition will be maintained Outcome: Progressing   

## 2023-10-30 NOTE — Progress Notes (Addendum)
Patient admitted voluntarily for passive SI and HI towards daughter and daughter's boyfriend and worsening depression. Dx: MDD. UDS positive for cocaine.  Patient endorses passive SI. He denies HI/AVH. Patient is present in the milieu. PRN cepacol lozenge adm. Upon follow up, pt found relief.  Patient expected to discharge on 12.31. Q15 minute unit checks in place.

## 2023-10-30 NOTE — Group Note (Signed)
Date:  10/30/2023 Time:  9:02 PM  Group Topic/Focus:  Wrap-Up Group:   The focus of this group is to help patients review their daily goal of treatment and discuss progress on daily workbooks.    Participation Level:  Active  Participation Quality:  Appropriate  Affect:  Appropriate  Cognitive:  Appropriate  Insight: Appropriate  Engagement in Group:  Engaged  Modes of Intervention:  Discussion  Additional Comments:    Robert Chan 10/30/2023, 9:02 PM

## 2023-10-31 DIAGNOSIS — F332 Major depressive disorder, recurrent severe without psychotic features: Secondary | ICD-10-CM | POA: Diagnosis not present

## 2023-10-31 DIAGNOSIS — F1410 Cocaine abuse, uncomplicated: Secondary | ICD-10-CM | POA: Diagnosis not present

## 2023-10-31 NOTE — Group Note (Signed)
Date:  10/31/2023 Time:  11:49 PM  Group Topic/Focus:  Wrap-Up Group:   The focus of this group is to help patients review their daily goal of treatment and discuss progress on daily workbooks.    Participation Level:  Active  Participation Quality:  Appropriate  Affect:  Appropriate  Cognitive:  Appropriate  Insight: Appropriate  Engagement in Group:  Engaged  Modes of Intervention:  Discussion  Additional Comments:    [name] 10/31/2023, 11:49 PM

## 2023-10-31 NOTE — Group Note (Signed)
Date:  10/31/2023 Time:  11:07 AM  Group Topic/Focus:  Movie Group The purpose of this group is to allow patients to have leisure time watching movies while having snacks     Participation Level:  Active  Participation Quality:  Appropriate  Affect:  Appropriate  Cognitive:  Alert and Appropriate  Insight: Appropriate  Engagement in Group:  Engaged  Modes of Intervention:  Activity  Additional Comments:    Marta Antu 10/31/2023, 11:07 AM

## 2023-10-31 NOTE — Plan of Care (Signed)
z

## 2023-10-31 NOTE — Group Note (Signed)
Recreation Therapy Group Note   Group Topic:Health and Wellness  Group Date: 10/31/2023 Start Time: 1400 End Time: 1500 Facilitators: Rosina Lowenstein, LRT, CTRS Location: Courtyard  Group Description: Outdoor Recreation. Patients had the option to play corn hole, ring toss, bowling or listening to music while outside in the courtyard getting fresh air and sunlight. LRT and patients discussed things that they enjoy doing in their free time outside of the hospital. LRT encouraged patients to drink water after being active and getting their heart rate up.   Goal Area(s) Addressed: Patient will identify leisure interests.  Patient will practice healthy decision making. Patient will engage in recreation activity.   Affect/Mood: Appropriate   Participation Level: Active and Engaged   Participation Quality: Independent   Behavior: Appropriate, Calm, and Cooperative   Speech/Thought Process: Coherent   Insight: Good   Judgement: Good   Modes of Intervention: Activity, Exploration, Open Conversation, and Rapport Building   Patient Response to Interventions:  Attentive, Engaged, and Receptive   Education Outcome:  Acknowledges education   Clinical Observations/Individualized Feedback: Robert Chan was active in their participation of session activities and group discussion. Pt chose to play basketball and walk laps while outside. Pt interacted well with LRT and peers duration of session.    Plan: Continue to engage patient in RT group sessions 2-3x/week.   Rosina Lowenstein, LRT, CTRS 10/31/2023 4:34 PM

## 2023-10-31 NOTE — Plan of Care (Signed)
Pt. was compliant with treatment this shift. No issues reported.

## 2023-10-31 NOTE — Progress Notes (Signed)
   10/31/23 1059  Psych Admission Type (Psych Patients Only)  Admission Status Voluntary  Psychosocial Assessment  Patient Complaints None  Eye Contact Fair  Facial Expression Flat  Affect Flat  Speech Logical/coherent  Interaction Minimal  Motor Activity Slow  Appearance/Hygiene In scrubs  Behavior Characteristics Cooperative  Mood Pleasant  Thought Process  Coherency WDL  Content WDL  Delusions None reported or observed  Perception WDL  Hallucination None reported or observed  Judgment WDL  Confusion None  Danger to Self  Current suicidal ideation? Denies  Danger to Others  Danger to Others None reported or observed

## 2023-10-31 NOTE — Progress Notes (Signed)
Pt. Was compliant with treatment this shift.   Robert Chan is a 67 y.o. male patient. No diagnosis found. 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   Depression    Elevated serum creatinine 08/28/2019   Homelessness 06/19/2020   Homicidal ideations 05/25/2023   Hypertension    Major depressive disorder, recurrent episode, 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/11/2022   Major depressive disorder, single episode, 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/10/2022   MDD (major depressive disorder), recurrent severe, 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   NSTEMI (non-ST elevated myocardial infarction) (HCC)    Schizoaffective disorder (HCC) 11/08/2022   Status post incision and drainage 04/22/2021   Suicidal ideation 06/05/2020   Suicidal ideation 06/05/2020   Current Facility-Administered Medications  Medication Dose Route Frequency Provider Last Rate Last Admin   acetaminophen (TYLENOL) tablet 650 mg  650 mg Oral Q6H PRN Maryagnes Amos, FNP   650 mg at 10/19/23 1139   alum & mag hydroxide-simeth (MAALOX/MYLANTA) 200-200-20 MG/5ML suspension 30 mL  30 mL Oral Q4H PRN Maryagnes Amos, FNP       ARIPiprazole (ABILIFY) tablet 15 mg  15 mg Oral QPC breakfast [name], DO   15 mg at 10/30/23 1004   atorvastatin (LIPITOR) tablet 80 mg  80 mg Oral Daily Maryagnes Amos, FNP   80 mg at 10/30/23 1004   diltiazem (CARDIZEM CD) 24 hr capsule 180 mg  180 mg Oral Daily Lewanda Rife, MD   180 mg at 10/30/23 1004   docusate sodium (COLACE) capsule 100 mg  100 mg Oral BID Lewanda Rife, MD   100 mg at 10/30/23 2152   guaiFENesin (MUCINEX) 12 hr tablet 600 mg  600 mg Oral BID Lewanda Rife, MD   600 mg at 10/30/23 2152    latanoprost (XALATAN) 0.005 % ophthalmic solution 1 drop  1 drop Both Eyes QHS Maryagnes Amos, FNP   1 drop at 10/30/23 2153   magnesium hydroxide (MILK OF MAGNESIA) suspension 30 mL  30 mL Oral Daily PRN Maryagnes Amos, FNP   30 mL at 10/12/23 1718   melatonin tablet 5 mg  5 mg Oral QHS Lewanda Rife, MD   5 mg at 10/30/23 2152   menthol-cetylpyridinium (CEPACOL) lozenge 3 mg  1 lozenge Oral PRN Lewanda Rife, MD   3 mg at 10/30/23 1006   mirtazapine (REMERON) tablet 30 mg  30 mg Oral QHS Lewanda Rife, MD   30 mg at 10/30/23 2152   mometasone-formoterol (DULERA) 200-5 MCG/ACT inhaler 2 puff  2 puff Inhalation BID Maryagnes Amos, FNP   2 puff at 10/30/23 2038   OLANZapine zydis (ZYPREXA) disintegrating tablet 5 mg  5 mg Oral TID PRN Maryagnes Amos, FNP       pantoprazole (PROTONIX) EC tablet 20 mg  20 mg Oral Daily Maryagnes Amos, FNP   20 mg at 10/30/23 1004   senna-docusate (Senokot-S) tablet 1 tablet  1 tablet Oral QHS PRN Maryagnes Amos, FNP   1 tablet at 10/13/23 2107   Allergies  Allergen Reactions   Shellfish Allergy Hives and Rash   Principal Problem:   MDD (major depressive disorder), recurrent episode, severe (HCC) Active Problems:  Cocaine abuse (HCC)  Blood pressure 111/75, pulse 77, temperature 98.2 F (36.8 C), resp. rate 14, height 6\' 4"  (1.93 m), weight 112.5 kg, SpO2 100%.  Subjective Objective: Vital signs: (most recent): Blood pressure 111/75, pulse 77, temperature 98.2 F (36.8 C), resp. rate 14, height 6\' 4"  (1.93 m), weight 112.5 kg, SpO2 100%.    Assessment & Plan  [name] 10/31/2023

## 2023-10-31 NOTE — Progress Notes (Signed)
Robert [place] Cupey MD Progress Note  10/31/2023  Robert Chan  MRN:  [id number]  Robert Chan is a 67 y.o. male who was initially admitted medically for 10/10/2023  for 2 days of chest pain and week of SI/HI. He carries the psychiatric diagnoses of MDD, severe, recurrent, without psychosis and has a past medical history of  atherosclerotic CAD, COPD, MDD without psychosis, hypertension, hx of cocaine use, history of NSTEMI 06/2020, underwent Left cardiac cath showed 30% prox LAD stenosis and 20% mid RCA stenosis .   Subjective:  Chart reviewed, patient's case discussed in multidisciplinary meeting, patient seen during rounds.    Patient continues to report" better" mood today.  Patient denies suicide thoughts today which is an improvement from yesterday.  Patient affect is brighter today.  He denies auditory or  visual hallucinations.  Patient has been attending groups and working on coping strategies.   Principal Problem: MDD (major depressive disorder), recurrent episode, severe (HCC) Diagnosis: Principal Problem:   MDD (major depressive disorder), recurrent episode, severe (HCC) Active Problems:   Cocaine abuse (HCC)    Past Psychiatric History: Patient has a long history of depression and cocaine use. Patient was admitted to this facility in October. 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   Depression    Elevated serum creatinine 08/28/2019   Homelessness 06/19/2020   Homicidal ideations 05/25/2023   Hypertension    Major depressive disorder, recurrent episode, 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/11/2022   Major depressive disorder, single episode, severe (HCC)  08/10/2022   MDD (major depressive disorder), recurrent severe, 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   NSTEMI (non-ST elevated myocardial infarction) (HCC)    Schizoaffective disorder (HCC) 11/08/2022   Status post incision and drainage 04/22/2021   Suicidal ideation 06/05/2020   Suicidal ideation 06/05/2020    Past Surgical History:  Procedure Laterality Date   APPENDECTOMY     EYE SURGERY     LEFT HEART CATH AND CORONARY ANGIOGRAPHY N/A 06/05/2020   Procedure: LEFT HEART CATH AND CORONARY ANGIOGRAPHY;  Surgeon: Elder Negus, MD;  Location: [facility];  Service: Cardiovascular;  Laterality: N/A;   PROSTATE SURGERY     Family History:  Family History  Problem Relation Age of Onset   Hypertension Mother    Diabetes Mother    Hypertension Father    Hypertension Sister    Family Psychiatric  History: Patient reports history brothers committed suicide by overdose on drugs  Social History:  Social History   Substance and Sexual Activity  Alcohol Use Yes   Comment: 4 40oz beer a week     Social History   Substance and Sexual Activity  Drug Use Yes   Types: Cocaine   Comment: cocaine use once a month, last use 6 mos ago    Social History   Socioeconomic History   Marital status: Single    Spouse name: Not on file   Number of children: Not on file   Years of education: Not on file   Highest education level: Not on file  Occupational History   Not on file  Tobacco Use   Smoking  status: Some Days    Current packs/day: 0.00    Types: Cigarettes    Last attempt to quit: 12/25/2020    Years since quitting: 2.8   Smokeless tobacco: Never   Tobacco comments:    Smokes a couple cigarettes every now and then  Vaping Use   Vaping status: Never Used  Substance and Sexual Activity   Alcohol use: Yes    Comment: 4 40oz beer a week   Drug use: Yes    Types: Cocaine    Comment: cocaine use once a month, last use 6 mos ago   Sexual activity: Not Currently  Other  Topics Concern   Not on file  Social History Narrative   His mother passed at age 44 (when he was 22 years old) and his father raised him and his siblings   His parents were pastors as a lot of his sisters and other family members are today   There was a total of 13 children in his family Had 58 sisters, one sister deceased , Had 3 brothers, all brothers deceased   He is the youngest of the 22 and was "always in trouble in my younger years"   Family still live in [place], [place], some in [place], [place]   Has a Daughter and son with a total of 10 grandchildren (6 grand daughters local and 4 grandsons)    Values family   Married twice, present wife incarcerated   Social Drivers of Corporate investment banker Strain: Low Risk  (10/19/2022)   Overall Financial Resource Strain (CARDIA)    Difficulty of Paying Living Expenses: Not hard at all  Food Insecurity: No Food Insecurity (10/11/2023)   Hunger Vital Sign    Worried About Running Out of Food in the Last Year: Never true    Ran Out of Food in the Last Year: Never true  Recent Concern: Food Insecurity - Food Insecurity Present (09/16/2023)   Hunger Vital Sign    Worried About Running Out of Food in the Last Year: Sometimes true    Ran Out of Food in the Last Year: Sometimes true  Transportation Needs: No Transportation Needs (10/11/2023)   PRAPARE - Administrator, Civil Service (Medical): No    Lack of Transportation (Non-Medical): No  Recent Concern: Transportation Needs - Unmet Transportation Needs (10/10/2023)   PRAPARE - Transportation    Lack of Transportation (Medical): Yes    Lack of Transportation (Non-Medical): Yes  Physical Activity: Inactive (10/19/2022)   Exercise Vital Sign    Days of Exercise per Week: 0 days    Minutes of Exercise per Session: 0 min  Stress: No Stress Concern Present (10/19/2022)   [name] of Occupational Health - Occupational Stress Questionnaire    Feeling of Stress : Not  at all  Social Connections: Moderately Integrated (03/31/2022)   Social Connection and Isolation Panel [NHANES]    Frequency of Communication with Friends and Family: Three times a week    Frequency of Social Gatherings with Friends and Family: Three times a week    Attends Religious Services: 1 to 4 times per year    Active Member of Clubs or Organizations: Yes    Attends Banker Meetings: 1 to 4 times per year    Marital Status: Never married                         Sleep: Fair  Appetite:  Fair  Current Medications: Current Facility-Administered Medications  Medication Dose Route Frequency Provider Last Rate Last Admin   acetaminophen (TYLENOL) tablet 650 mg  650 mg Oral Q6H PRN Maryagnes Amos, FNP   650 mg at 10/19/23 1139   alum & mag hydroxide-simeth (MAALOX/MYLANTA) 200-200-20 MG/5ML suspension 30 mL  30 mL Oral Q4H PRN Maryagnes Amos, FNP       ARIPiprazole (ABILIFY) tablet 15 mg  15 mg Oral QPC breakfast [name], DO   15 mg at 10/31/23 1014   atorvastatin (LIPITOR) tablet 80 mg  80 mg Oral Daily Maryagnes Amos, FNP   80 mg at 10/31/23 1014   diltiazem (CARDIZEM CD) 24 hr capsule 180 mg  180 mg Oral Daily Lewanda Rife, MD   180 mg at 10/31/23 1013   docusate sodium (COLACE) capsule 100 mg  100 mg Oral BID Lewanda Rife, MD   100 mg at 10/31/23 1013   guaiFENesin (MUCINEX) 12 hr tablet 600 mg  600 mg Oral BID Lewanda Rife, MD   600 mg at 10/31/23 1014   latanoprost (XALATAN) 0.005 % ophthalmic solution 1 drop  1 drop Both Eyes QHS Maryagnes Amos, FNP   1 drop at 10/30/23 2153   magnesium hydroxide (MILK OF MAGNESIA) suspension 30 mL  30 mL Oral Daily PRN Maryagnes Amos, FNP   30 mL at 10/12/23 1718   melatonin tablet 5 mg  5 mg Oral QHS Lewanda Rife, MD   5 mg at 10/30/23 2152   menthol-cetylpyridinium (CEPACOL) lozenge 3 mg  1 lozenge Oral PRN Lewanda Rife, MD   3 mg at 10/30/23  1006   mirtazapine (REMERON) tablet 30 mg  30 mg Oral QHS Lewanda Rife, MD   30 mg at 10/30/23 2152   mometasone-formoterol (DULERA) 200-5 MCG/ACT inhaler 2 puff  2 puff Inhalation BID Maryagnes Amos, FNP   2 puff at 10/31/23 1013   OLANZapine zydis (ZYPREXA) disintegrating tablet 5 mg  5 mg Oral TID PRN Maryagnes Amos, FNP       pantoprazole (PROTONIX) EC tablet 20 mg  20 mg Oral Daily Maryagnes Amos, FNP   20 mg at 10/31/23 1013   senna-docusate (Senokot-S) tablet 1 tablet  1 tablet Oral QHS PRN Maryagnes Amos, FNP   1 tablet at 10/13/23 2107    Lab Results: No results found for this or any previous visit (from the past 48 hours).  Blood Alcohol level:  Lab Results  Component Value Date   ETH <10 10/10/2023   ETH <10 09/15/2023    Metabolic Disorder Labs: Lab Results  Component Value Date   HGBA1C 5.1 09/15/2023   MPG 99.67 09/15/2023   MPG 103 01/13/2023   No results found for: "PROLACTIN" Lab Results  Component Value Date   CHOL 159 09/15/2023   TRIG 53 09/15/2023   HDL 61 09/15/2023   CHOLHDL 2.6 09/15/2023   VLDL 11 09/15/2023   LDLCALC 87 09/15/2023   LDLCALC 99 01/13/2023      Musculoskeletal: Strength & Muscle Tone: within normal limits Gait & Station: normal Patient leans: N/A   Psychiatric Specialty Exam:   Presentation  General Appearance:  Appropriate for Environment   Eye Contact: Fair   Speech: Normal Rate   Speech Volume: Normal   Handedness: Right     Mood and Affect  Mood: " Better"   Affect: Brighter and more animated     Thought Process  Thought Processes: Coherent  Descriptions of Associations:Intact   Orientation:Full (Time, Place and Person)   Thought Content: Denies suicidal thoughts  History of Schizophrenia/Schizoaffective disorder:No   Duration of Psychotic Symptoms:N/A   Hallucinations:Hallucinations: None   Ideas of Reference:None   Suicidal Thoughts: Denies  suicide thoughts Homicidal Thoughts: Denies     Sensorium  Memory: Immediate Good; Recent Good; Remote Good   Judgment: Improving   Insight: Fair     Chartered certified accountant: Fair   Attention Span: Fair   Recall: [employer] of Knowledge: Fair   Language: Fair     Psychomotor Activity  Psychomotor Activity: Psychomotor Activity: Normal     Assets  Assets: Social Support; Desire for Improvement     Sleep  Sleep: Improved       Physical Exam: Physical Exam Constitutional:      Appearance: Normal appearance.  HENT:     Head: Normocephalic and atraumatic.     Nose: Nose normal.  Eyes:     Pupils: Pupils are equal, round, and reactive to light.  Cardiovascular:     Rate and Rhythm: Regular rhythm.  Pulmonary:     Effort: Pulmonary effort is normal.  Skin:    General: Skin is warm.  Neurological:     General: No focal deficit present.     Mental Status: He is alert and oriented to person, place, and time.      Review of Systems  Constitutional:  Negative for chills and fever.  HENT:  Negative for hearing loss and sore throat.   Eyes:  Negative for blurred vision and double vision.  Respiratory:  Negative for cough and shortness of breath.   Cardiovascular:  Negative for chest pain.  Gastrointestinal:  Negative for nausea and vomiting.  Neurological:  Negative for dizziness and speech change.  .    Blood pressure 130/69, pulse 74, temperature 98.2 F (36.8 C), resp. rate 15, height 6\' 4"  (1.93 m), weight 112.5 kg, SpO2 96%. Body mass index is 30.19 kg/m.   Treatment Plan Summary: Daily contact with patient to assess and evaluate symptoms and progress in treatment and Medication management Patient is admitted to locked unit under safety precautions Patient has been on Abilify in the past, at this time patient is denying any psychotic symptoms will defer use of Abilify for now Increase the dose of Remeron to 30 mg at bedtime for  depression and insomnia 10/17/23 Patient was started on Abilify 15 mg on 10/21/2023 Patient was encouraged to abstain from alcohol and drug use Patient was encouraged to attend group and work on coping strategies Social worker consulted to get collateral and help with a safe discharge plan. started on melatonin 5 mg at bedtime for insomnia 10/17/2023  Lewanda Rife, MD

## 2023-11-01 DIAGNOSIS — F332 Major depressive disorder, recurrent severe without psychotic features: Secondary | ICD-10-CM | POA: Diagnosis not present

## 2023-11-01 MED ORDER — SENNOSIDES-DOCUSATE SODIUM 8.6-50 MG PO TABS: 1.0000 | ORAL_TABLET | Freq: Every evening | ORAL | 3 refills | Status: DC | PRN

## 2023-11-01 MED ORDER — LATANOPROST 0.005 % OP SOLN: 1.0000 [drp] | Freq: Every day | OPHTHALMIC | 12 refills | Status: DC

## 2023-11-01 MED ORDER — DILTIAZEM HCL ER COATED BEADS 180 MG PO CP24: 180.0000 mg | ORAL_CAPSULE | Freq: Every day | ORAL | 3 refills | Status: DC

## 2023-11-01 MED ORDER — PANTOPRAZOLE SODIUM 20 MG PO TBEC: 20.0000 mg | DELAYED_RELEASE_TABLET | Freq: Every day | ORAL | 3 refills | Status: DC

## 2023-11-01 MED ORDER — ALBUTEROL SULFATE HFA 108 (90 BASE) MCG/ACT IN AERS: 2.0000 | INHALATION_SPRAY | Freq: Four times a day (QID) | RESPIRATORY_TRACT | 3 refills | Status: DC | PRN

## 2023-11-01 MED ORDER — MELATONIN 5 MG PO TABS: 5.0000 mg | ORAL_TABLET | Freq: Every day | ORAL | 3 refills | Status: DC

## 2023-11-01 MED ORDER — MIRTAZAPINE 30 MG PO TABS: 30.0000 mg | ORAL_TABLET | Freq: Every day | ORAL | 3 refills | Status: DC

## 2023-11-01 MED ORDER — MOMETASONE FURO-FORMOTEROL FUM 200-5 MCG/ACT IN AERO: 2.0000 | INHALATION_SPRAY | Freq: Two times a day (BID) | RESPIRATORY_TRACT | 3 refills | Status: DC

## 2023-11-01 MED ORDER — ARIPIPRAZOLE 15 MG PO TABS: 15.0000 mg | ORAL_TABLET | Freq: Every day | ORAL | 3 refills | Status: DC

## 2023-11-01 MED ORDER — ATORVASTATIN CALCIUM 80 MG PO TABS: 80.0000 mg | ORAL_TABLET | Freq: Every day | ORAL | 3 refills | Status: DC

## 2023-11-01 NOTE — Progress Notes (Signed)
 Patient alert and oriented x 4.  Affect is pleasant and calm. Denies anxiety, SI/HI or AVH.  Patient states they will try to keep themselves safe when they return home.  Reviewed discharge instructions with patient including follow up appointment with provider, medication and prescriptions.  Questions answered and understanding verbalized.  Discharge packet given.  All belongings returned to patient after verification completed by staff.    Patient escorted by staff off unit at this time stable without complaint.

## 2023-11-01 NOTE — Care Management Important Message (Signed)
Important Message  Patient Details  Name: Robert Chan MRN: [id number] Date of Birth: [birth year]   Important Message Given:  Yes - Medicare IM     Elza Rafter, LCSWA 11/01/2023, 10:43 AM

## 2023-11-01 NOTE — Discharge Summary (Signed)
 Physician Discharge Summary Note  Patient:  Robert Chan is an 67 y.o., male MRN:  [id number] DOB:  May 15, 1956 Patient phone:  [phone] (home)  Patient address:   [address] [place] [address],  Total Time spent with patient: 1 hour  Date of Admission:  10/11/2023 Date of Discharge: 11/01/2023  Reason for Admission:  Robert Chan is a 67 y.o. male who was initially admitted medically for 10/10/2023  for 2 days of chest pain and week of SI/HI. He carries the psychiatric diagnoses of MDD, severe, recurrent, without psychosis and has a past medical history of  atherosclerotic CAD, COPD, MDD without psychosis, hypertension, hx of cocaine use, history of NSTEMI 06/2020, underwent Left cardiac cath showed 30% prox LAD stenosis and 20% mid RCA stenosis .  Chart reviewed, patient case discussed in multidisciplinary meeting, patient seen during rounds and in treatment team meeting with RN and child psychotherapist.  Patient endorsed depressed mood, anhedonia, poor sleep, he feels helpless and hopeless.  Patient said that after his discharge from this facility he did not fill his prescriptions.  He relapsed on cocaine.  Patient reports that he was having thoughts of suicide and had plan to shoot himself.  Per patient's report he does not own a gun but he borrowed a gun from his friend and and tried to shoot himself but the gun did not fire.  Patient was provided with support and reassurance.  He was encouraged to work on coping strategies.  Patient was also encouraged to abstain from cocaine use. Patient denies any intention to harm himself on the unit.  He denies psychotic or manic symptoms.  Principal Problem: MDD (major depressive disorder), recurrent episode, severe (HCC) Discharge Diagnoses: Principal Problem:   MDD (major depressive disorder), recurrent episode, severe (HCC) Active Problems:   Cocaine abuse (HCC)   Past Psychiatric History: Patient has a long history of depression  and cocaine use. Patient was admitted to this facility in October. 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   Depression    Elevated serum creatinine 08/28/2019   Homelessness 06/19/2020   Homicidal ideations 05/25/2023   Hypertension    Major depressive disorder, recurrent episode, 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/11/2022   Major depressive disorder, single episode, 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/10/2022   MDD (major depressive disorder), recurrent severe, 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   NSTEMI (non-ST elevated myocardial infarction) (HCC)    Schizoaffective disorder (HCC) 11/08/2022   Status post incision and drainage 04/22/2021   Suicidal ideation 06/05/2020   Suicidal ideation 06/05/2020    Past Surgical History:  Procedure Laterality Date   APPENDECTOMY     EYE SURGERY     LEFT HEART CATH AND CORONARY ANGIOGRAPHY N/A 06/05/2020   Procedure: LEFT HEART CATH AND CORONARY ANGIOGRAPHY;  Surgeon: Elmira Newman PARAS, MD;  Location: [facility];  Service: Cardiovascular;  Laterality: N/A;   PROSTATE SURGERY     Family History:  Family History  Problem Relation Age of Onset   Hypertension Mother    Diabetes Mother    Hypertension Father    Hypertension Sister    Family  Psychiatric  History: Unremarkable Social History:  Social History   Substance and Sexual Activity  Alcohol Use Yes   Comment: 4 40oz beer a week     Social History   Substance and Sexual Activity  Drug Use Yes   Types: Cocaine   Comment: cocaine use once a month, last use 6 mos ago    Social History   Socioeconomic History   Marital status: Single    Spouse name: Not on file    Number of children: Not on file   Years of education: Not on file   Highest education level: Not on file  Occupational History   Not on file  Tobacco Use   Smoking status: Some Days    Current packs/day: 0.00    Types: Cigarettes    Last attempt to quit: 12/25/2020    Years since quitting: 2.8   Smokeless tobacco: Never   Tobacco comments:    Smokes a couple cigarettes every now and then  Vaping Use   Vaping status: Never Used  Substance and Sexual Activity   Alcohol use: Yes    Comment: 4 40oz beer a week   Drug use: Yes    Types: Cocaine    Comment: cocaine use once a month, last use 6 mos ago   Sexual activity: Not Currently  Other Topics Concern   Not on file  Social History Narrative   His mother passed at age 34 (when he was 67 years old) and his father raised him and his siblings   His parents were pastors as a lot of his sisters and other family members are today   There was a total of 13 children in his family Had 69 sisters, one sister deceased , Had 3 brothers, all brothers deceased   He is the youngest of the 36 and was always in trouble in my younger years   Family still live in [place], [place], some in [place], [place]   Has a Daughter and son with a total of 10 grandchildren (6 grand daughters local and 4 grandsons)    Values family   Married twice, present wife incarcerated   Social Drivers of Corporate Investment Banker Strain: Low Risk  (10/19/2022)   Overall Financial Resource Strain (CARDIA)    Difficulty of Paying Living Expenses: Not hard at all  Food Insecurity: No Food Insecurity (10/11/2023)   Hunger Vital Sign    Worried About Running Out of Food in the Last Year: Never true    Ran Out of Food in the Last Year: Never true  Recent Concern: Food Insecurity - Food Insecurity Present (09/16/2023)   Hunger Vital Sign    Worried About Running Out of Food in the Last Year: Sometimes true    Ran Out of Food in the Last Year: Sometimes true   Transportation Needs: No Transportation Needs (10/11/2023)   PRAPARE - Administrator, Civil Service (Medical): No    Lack of Transportation (Non-Medical): No  Recent Concern: Transportation Needs - Unmet Transportation Needs (10/10/2023)   PRAPARE - Transportation    Lack of Transportation (Medical): Yes    Lack of Transportation (Non-Medical): Yes  Physical Activity: Inactive (10/19/2022)   Exercise Vital Sign    Days of Exercise per Week: 0 days    Minutes of Exercise per Session: 0 min  Stress: No Stress Concern Present (10/19/2022)   [name] of Occupational Health - Occupational Stress Questionnaire    Feeling of  Stress : Not at all  Social Connections: Unknown (11/01/2023)   Social Connection and Isolation Panel [NHANES]    Frequency of Communication with Friends and Family: Three times a week    Frequency of Social Gatherings with Friends and Family: Three times a week    Attends Religious Services: Not on file    Active Member of Clubs or Organizations: Not on file    Attends Club or Organization Meetings: Not on file    Marital Status: Not on file    Hospital Course: Robert Chan is a 67 year old African-American male who was voluntarily admitted to inpatient psychiatry for worsening depression and polysubstance abuse.  He was placed back on his home medications which she was noncompliant with and his Abilify  was increased to 15 mg/day.  He was placed back on Remeron  which was titrated up to 30 mg at nighttime.  He did well on his medications and there were no side effects.  It was felt that he maximized hospitalization he was discharged back home.  He did not want to go to rehab.  On the day of discharge she denied suicidal ideation, homicidal ideation, auditory or visual hallucinations.  Judgment and insight were good.  Physical Findings: AIMS:  , ,  ,  ,    CIWA:    COWS:     Musculoskeletal: Strength & Muscle Tone: within normal limits Gait & Station:  normal Patient leans: N/A   Psychiatric Specialty Exam:  Presentation  General Appearance:  Appropriate for Environment  Eye Contact: -- (Staring at the wall when talking, minimal eye contact with the interviewer)  Speech: Normal Rate  Speech Volume: Normal  Handedness: Right   Mood and Affect  Mood: Depressed  Affect: Congruent; Depressed   Thought Process  Thought Processes: Coherent  Descriptions of Associations:Intact  Orientation:Full (Time, Place and Person)  Thought Content:Logical  History of Schizophrenia/Schizoaffective disorder:No  Duration of Psychotic Symptoms:N/A  Hallucinations:No data recorded Ideas of Reference:None  Suicidal Thoughts:No data recorded Homicidal Thoughts:No data recorded  Sensorium  Memory: Immediate Good; Recent Good; Remote Good  Judgment: Poor  Insight: Fair   Chartered Certified Accountant: Fair  Attention Span: Fair  Recall: [employer] of Knowledge: Fair  Language: Fair   Psychomotor Activity  Psychomotor Activity:No data recorded  Assets  Assets: Social Support; Desire for Improvement   Sleep  Sleep:No data recorded   Physical Exam: Physical Exam Vitals and nursing note reviewed.  Constitutional:      Appearance: Normal appearance. He is normal weight.  Neurological:     General: No focal deficit present.     Mental Status: He is alert and oriented to person, place, and time.  Psychiatric:        Attention and Perception: Attention and perception normal.        Mood and Affect: Mood and affect normal.        Speech: Speech normal.        Behavior: Behavior normal. Behavior is cooperative.        Thought Content: Thought content normal.        Cognition and Memory: Cognition and memory normal.        Judgment: Judgment normal.    Review of Systems  Constitutional: Negative.   HENT: Negative.    Eyes: Negative.   Respiratory: Negative.    Cardiovascular: Negative.    Gastrointestinal: Negative.   Genitourinary: Negative.   Musculoskeletal: Negative.   Skin: Negative.   Neurological: Negative.   Endo/Heme/Allergies:  Negative.   Psychiatric/Behavioral: Negative.     Blood pressure 135/79, pulse 73, temperature (!) 97.1 F (36.2 C), resp. rate 18, height 6' 4 (1.93 m), weight 112.5 kg, SpO2 99%. Body mass index is 30.19 kg/m.   Social History   Tobacco Use  Smoking Status Some Days   Current packs/day: 0.00   Types: Cigarettes   Last attempt to quit: 12/25/2020   Years since quitting: 2.8  Smokeless Tobacco Never  Tobacco Comments   Smokes a couple cigarettes every now and then   Tobacco Cessation:  A prescription for an FDA-approved tobacco cessation medication was offered at discharge and the patient refused   Blood Alcohol level:  Lab Results  Component Value Date   [facility] <10 10/10/2023   ETH <10 09/15/2023    Metabolic Disorder Labs:  Lab Results  Component Value Date   HGBA1C 5.1 09/15/2023   MPG 99.67 09/15/2023   MPG 103 01/13/2023   No results found for: PROLACTIN Lab Results  Component Value Date   CHOL 159 09/15/2023   TRIG 53 09/15/2023   HDL 61 09/15/2023   CHOLHDL 2.6 09/15/2023   VLDL 11 09/15/2023   LDLCALC 87 09/15/2023   LDLCALC 99 01/13/2023    See Psychiatric Specialty Exam and Suicide Risk Assessment completed by Attending Physician prior to discharge.  Discharge destination:  Home  Is patient on multiple antipsychotic therapies at discharge:  No   Has Patient had three or more failed trials of antipsychotic monotherapy by history:  No  Recommended Plan for Multiple Antipsychotic Therapies: NA   Allergies as of 11/01/2023       Reactions   Shellfish Allergy Hives, Rash        Medication List     STOP taking these medications    lidocaine  5 % Commonly known as: LIDODERM    traZODone  50 MG tablet Commonly known as: DESYREL        TAKE these medications      Indication   albuterol  108 (90 Base) MCG/ACT inhaler Commonly known as: VENTOLIN  HFA Inhale 2 puffs into the lungs every 6 (six) hours as needed for shortness of breath.  Indication: Asthma, Chronic Obstructive Lung Disease   ARIPiprazole  15 MG tablet Commonly known as: ABILIFY  Take 1 tablet (15 mg total) by mouth daily after breakfast. Start taking on: November 02, 2023 What changed:  medication strength how much to take when to take this  Indication: Major Depressive Disorder   atorvastatin  80 MG tablet Commonly known as: LIPITOR  Take 1 tablet (80 mg total) by mouth daily.  Indication: High Amount of Fats in the Blood   diltiazem  180 MG 24 hr capsule Commonly known as: CARDIZEM  CD Take 1 capsule (180 mg total) by mouth daily.  Indication: High Blood Pressure   latanoprost  0.005 % ophthalmic solution Commonly known as: XALATAN  Place 1 drop into both eyes at bedtime.    melatonin 5 MG Tabs Take 1 tablet (5 mg total) by mouth at bedtime.  Indication: Trouble Sleeping   mirtazapine  30 MG tablet Commonly known as: REMERON  Take 1 tablet (30 mg total) by mouth at bedtime.  Indication: Major Depressive Disorder   mometasone -formoterol  200-5 MCG/ACT Aero Commonly known as: DULERA  Inhale 2 puffs into the lungs 2 (two) times daily.  Indication: Chronic Obstructive Lung Disease   pantoprazole  20 MG tablet Commonly known as: PROTONIX  Take 1 tablet (20 mg total) by mouth daily. Start taking on: November 02, 2023  Indication: Gastroesophageal Reflux Disease   senna-docusate  8.6-50 MG tablet Commonly known as: Senokot-S Take 1 tablet by mouth at bedtime as needed for mild constipation.  Indication: Constipation        Follow-up Information     [facility] Follow up.   Contact information: [address] [place] [address] [phone]                 Follow-up recommendations: [facility]    Signed: Charlie Dallas Salines, DO 11/01/2023, 10:21  AM

## 2023-11-01 NOTE — Progress Notes (Deleted)
  [facility] Adult Case Management Discharge Plan :  Will you be returning to the same living situation after discharge:  Yes,  pt will return home  At discharge, do you have transportation home?: Yes,  CSW will assist with transportation  Do you have the ability to pay for your medications: Yes,   UNITED [employer] MEDICARE / DREMA DUAL COMPLETE  Release of information consent forms completed and in the chart;  Patient's signature needed at discharge.  Patient to Follow up at:  Follow-up Information     [facility]. Go to.   Why: Although you declined scheduled follow-up I am putting walk-in hours for [facility] as follows:   Hours of Operation: Monday-Friday, 8 a.m. - 5 p.m. Office closed for lunch, 12 p.m. - 1 p.m. The last Open Access walk-in will be taken at 3 p.m. each day Contact information: [address]  [address] [place] [address] [phone]                 Next level of care provider has access to [facility] Link:no  Safety Planning and Suicide Prevention discussed: Yes,  although pt declined SPE with friend/family, CSW went over SPE brochure with pt at discharge      Has patient been referred to the Quitline?: Patient does not use tobacco/nicotine  products  Patient has been referred for addiction treatment: Patient refused referral for treatment.  [address] LCSWA 11/01/2023, 10:41 AM

## 2023-11-01 NOTE — Progress Notes (Signed)
 Patient pleasant and cooperative.  Appropriate affect.  Denies SI/HI and AVH.  Denies anxiety and depression.  Denis pain.    Compliant with scheduled medications. 15 min checks in place for safety.  Patient is present in the milieu.  Appropriate interaction with peers and staff.    Patient to discharge home today.

## 2023-11-01 NOTE — Plan of Care (Signed)
  Problem: Safety: Goal: Ability to remain free from injury will improve Outcome: Progressing   

## 2023-11-01 NOTE — Plan of Care (Signed)
  Problem: Education: Goal: Knowledge of General Education information will improve Description: Including pain rating scale, medication(s)/side effects and non-pharmacologic comfort measures Outcome: Progressing   Problem: Clinical Measurements: Goal: Will remain free from infection Outcome: Progressing   Problem: Nutrition: Goal: Adequate nutrition will be maintained Outcome: Progressing   

## 2023-11-01 NOTE — Progress Notes (Signed)
  [facility] Adult Case Management Discharge Plan :  Will you be returning to the same living situation after discharge:  Yes,  pt will return home  At discharge, do you have transportation home?: Yes,  CSW will assist with transportation  Do you have the ability to pay for your medications: Yes,  UNITED HEALTHCARE MEDICARE / DREMA DUAL COMPLETE  Release of information consent forms completed and in the chart;  Patient's signature needed at discharge.  Patient to Follow up at:  Follow-up Information     [facility]. Go to.   Why: Your appointment is scheduled for  1/7 at 3 via the phone and will receive a call at [phone] Contact information: [address]  [address] [place] [address] [phone]                 Next level of care provider has access to [facility] Link:no  Safety Planning and Suicide Prevention discussed: Yes,  although pt declined SPE with friend/family, CSW went over SPE brochure with pt at discharge      Has patient been referred to the Quitline?: Patient does not use tobacco/nicotine  products  Patient has been referred for addiction treatment: Patient refused referral for treatment.  [address] LCSWA 11/01/2023, 10:52 AM

## 2023-11-01 NOTE — [facility]HH Suicide Risk Assessment (Signed)
 [facility] Discharge Suicide Risk Assessment   Principal Problem: MDD (major depressive disorder), recurrent episode, severe (HCC) Discharge Diagnoses: Principal Problem:   MDD (major depressive disorder), recurrent episode, severe (HCC) Active Problems:   Cocaine abuse (HCC)   Total Time spent with patient: 1 hour  Musculoskeletal: Strength & Muscle Tone: within normal limits Gait & Station: normal Patient leans: N/A  Psychiatric Specialty Exam  Presentation  General Appearance:  Appropriate for Environment  Eye Contact: -- (Staring at the wall when talking, minimal eye contact with the interviewer)  Speech: Normal Rate  Speech Volume: Normal  Handedness: Right   Mood and Affect  Mood: Depressed  Duration of Depression Symptoms: N/A  Affect: Congruent; Depressed   Thought Process  Thought Processes: Coherent  Descriptions of Associations:Intact  Orientation:Full (Time, Place and Person)  Thought Content:Logical  History of Schizophrenia/Schizoaffective disorder:No  Duration of Psychotic Symptoms:N/A  Hallucinations:No data recorded Ideas of Reference:None  Suicidal Thoughts:No data recorded Homicidal Thoughts:No data recorded  Sensorium  Memory: Immediate Good; Recent Good; Remote Good  Judgment: Poor  Insight: Fair   Chartered Certified Accountant: Fair  Attention Span: Fair  Recall: [employer] of Knowledge: Fair  Language: Fair   Psychomotor Activity  Psychomotor Activity:No data recorded  Assets  Assets: Social Support; Desire for Improvement   Sleep  Sleep:No data recorded   Blood pressure 135/79, pulse 73, temperature (!) 97.1 F (36.2 C), resp. rate 18, height 6' 4 (1.93 m), weight 112.5 kg, SpO2 99%. Body mass index is 30.19 kg/m.  Mental Status Per Nursing Assessment::   On Admission:  NA  Demographic Factors:  Male and Age 69 or older  Loss Factors: NA  Historical Factors: NA  Risk  Reduction Factors:   NA   Cognitive Features That Contribute To Risk:  None    Suicide Risk:  Minimal: No identifiable suicidal ideation.  Patients presenting with no risk factors but with morbid ruminations; may be classified as minimal risk based on the severity of the depressive symptoms   Follow-up Information     [facility] Follow up.   Contact information: [address] [place] [address] [phone]                 Plan Of Care/Follow-up recommendations: [facility]   Charlie Dallas Salines, DO 11/01/2023, 10:09 AM

## 2023-11-01 NOTE — Plan of Care (Signed)
  Problem: Education: Goal: Knowledge of General Education information will improve Description: Including pain rating scale, medication(s)/side effects and non-pharmacologic comfort measures Outcome: Adequate for Discharge   Problem: Health Behavior/Discharge Planning: Goal: Ability to manage health-related needs will improve Outcome: Adequate for Discharge   Problem: Clinical Measurements: Goal: Ability to maintain clinical measurements within normal limits will improve Outcome: Adequate for Discharge Goal: Will remain free from infection Outcome: Adequate for Discharge Goal: Diagnostic test results will improve Outcome: Adequate for Discharge Goal: Respiratory complications will improve Outcome: Adequate for Discharge Goal: Cardiovascular complication will be avoided Outcome: Adequate for Discharge   Problem: Activity: Goal: Risk for activity intolerance will decrease Outcome: Adequate for Discharge   Problem: Nutrition: Goal: Adequate nutrition will be maintained Outcome: Adequate for Discharge   Problem: Coping: Goal: Level of anxiety will decrease Outcome: Adequate for Discharge   Problem: Elimination: Goal: Will not experience complications related to bowel motility Outcome: Adequate for Discharge Goal: Will not experience complications related to urinary retention Outcome: Adequate for Discharge   Problem: Pain Management: Goal: General experience of comfort will improve Outcome: Adequate for Discharge   Problem: Safety: Goal: Ability to remain free from injury will improve Outcome: Adequate for Discharge   Problem: Skin Integrity: Goal: Risk for impaired skin integrity will decrease Outcome: Adequate for Discharge   Problem: Education: Goal: Utilization of techniques to improve thought processes will improve Outcome: Adequate for Discharge

## 2023-11-07 ENCOUNTER — Other Ambulatory Visit: Payer: Self-pay

## 2023-11-07 ENCOUNTER — Other Ambulatory Visit (INDEPENDENT_AMBULATORY_CARE_PROVIDER_SITE_OTHER)
Admission: EM | Admit: 2023-11-07 | Discharge: 2023-11-09 | Disposition: A | Discharge status: Other Acute Inpatient Hospital | Payer: 59 | Source: Home / Self Care | Admitting: Family

## 2023-11-07 ENCOUNTER — Other Ambulatory Visit (HOSPITAL_COMMUNITY)
Admission: EM | Admit: 2023-11-07 | Discharge: 2023-11-07 | Disposition: A | Discharge status: Other Acute Inpatient Hospital | Payer: 59 | Attending: Family | Admitting: Family

## 2023-11-07 DIAGNOSIS — J449 Chronic obstructive pulmonary disease, unspecified: Secondary | ICD-10-CM | POA: Insufficient documentation

## 2023-11-07 DIAGNOSIS — F1094 Alcohol use, unspecified with alcohol-induced mood disorder: Secondary | ICD-10-CM | POA: Diagnosis present

## 2023-11-07 DIAGNOSIS — F1421 Cocaine dependence, in remission: Secondary | ICD-10-CM | POA: Insufficient documentation

## 2023-11-07 DIAGNOSIS — I2510 Atherosclerotic heart disease of native coronary artery without angina pectoris: Secondary | ICD-10-CM | POA: Insufficient documentation

## 2023-11-07 DIAGNOSIS — E785 Hyperlipidemia, unspecified: Secondary | ICD-10-CM | POA: Diagnosis not present

## 2023-11-07 DIAGNOSIS — I10 Essential (primary) hypertension: Secondary | ICD-10-CM | POA: Insufficient documentation

## 2023-11-07 DIAGNOSIS — Z9151 Personal history of suicidal behavior: Secondary | ICD-10-CM | POA: Diagnosis not present

## 2023-11-07 DIAGNOSIS — K219 Gastro-esophageal reflux disease without esophagitis: Secondary | ICD-10-CM | POA: Insufficient documentation

## 2023-11-07 DIAGNOSIS — Z79899 Other long term (current) drug therapy: Secondary | ICD-10-CM | POA: Insufficient documentation

## 2023-11-07 DIAGNOSIS — F419 Anxiety disorder, unspecified: Secondary | ICD-10-CM | POA: Insufficient documentation

## 2023-11-07 DIAGNOSIS — F1420 Cocaine dependence, uncomplicated: Secondary | ICD-10-CM | POA: Diagnosis not present

## 2023-11-07 DIAGNOSIS — R45851 Suicidal ideations: Secondary | ICD-10-CM | POA: Diagnosis not present

## 2023-11-07 DIAGNOSIS — F329 Major depressive disorder, single episode, unspecified: Secondary | ICD-10-CM | POA: Insufficient documentation

## 2023-11-07 DIAGNOSIS — F331 Major depressive disorder, recurrent, moderate: Secondary | ICD-10-CM | POA: Insufficient documentation

## 2023-11-07 DIAGNOSIS — F1020 Alcohol dependence, uncomplicated: Secondary | ICD-10-CM | POA: Diagnosis not present

## 2023-11-07 DIAGNOSIS — F1090 Alcohol use, unspecified, uncomplicated: Secondary | ICD-10-CM

## 2023-11-07 DIAGNOSIS — I252 Old myocardial infarction: Secondary | ICD-10-CM | POA: Insufficient documentation

## 2023-11-07 LAB — URINALYSIS, ROUTINE W REFLEX MICROSCOPIC
Bilirubin Urine: NEGATIVE
Glucose, UA: NEGATIVE mg/dL
Hgb urine dipstick: NEGATIVE
Ketones, ur: NEGATIVE mg/dL
Nitrite: NEGATIVE
Protein, ur: NEGATIVE mg/dL
Specific Gravity, Urine: 1.025 (ref 1.005–1.030)
pH: 5 (ref 5.0–8.0)

## 2023-11-07 LAB — POCT URINE DRUG SCREEN - MANUAL ENTRY (I-SCREEN)
POC Amphetamine UR: NOT DETECTED
POC Amphetamine UR: NOT DETECTED
POC Buprenorphine (BUP): NOT DETECTED
POC Buprenorphine (BUP): NOT DETECTED
POC Cocaine UR: POSITIVE — AB
POC Cocaine UR: POSITIVE — AB
POC Marijuana UR: NOT DETECTED
POC Marijuana UR: NOT DETECTED
POC Methadone UR: NOT DETECTED
POC Methadone UR: NOT DETECTED
POC Methamphetamine UR: NOT DETECTED
POC Methamphetamine UR: NOT DETECTED
POC Morphine: NOT DETECTED
POC Morphine: NOT DETECTED
POC Oxazepam (BZO): NOT DETECTED
POC Oxazepam (BZO): NOT DETECTED
POC Oxycodone UR: NOT DETECTED
POC Oxycodone UR: NOT DETECTED
POC Secobarbital (BAR): NOT DETECTED
POC Secobarbital (BAR): NOT DETECTED

## 2023-11-07 MED ORDER — TRAZODONE HCL 50 MG PO TABS: 50.0000 mg | ORAL_TABLET | Freq: Every evening | ORAL | Status: DC | PRN

## 2023-11-07 MED ORDER — ALUM & MAG HYDROXIDE-SIMETH 200-200-20 MG/5ML PO SUSP
30.0000 mL | ORAL | Status: DC | PRN
Start: 2023-11-07 — End: 2023-11-09

## 2023-11-07 MED ORDER — ALUM & MAG HYDROXIDE-SIMETH 200-200-20 MG/5ML PO SUSP
30.0000 mL | ORAL | Status: DC | PRN
Start: 2023-11-07 — End: 2023-11-07

## 2023-11-07 MED ORDER — ACETAMINOPHEN 325 MG PO TABS: 650.0000 mg | ORAL_TABLET | Freq: Four times a day (QID) | ORAL | Status: DC | PRN

## 2023-11-07 MED ORDER — MAGNESIUM HYDROXIDE 400 MG/5ML PO SUSP: 30.0000 mL | Freq: Every day | ORAL | Status: DC | PRN

## 2023-11-07 MED ORDER — HYDROXYZINE HCL 25 MG PO TABS: 25.0000 mg | ORAL_TABLET | Freq: Three times a day (TID) | ORAL | Status: DC | PRN

## 2023-11-07 MED ORDER — ACETAMINOPHEN 325 MG PO TABS
650.0000 mg | ORAL_TABLET | Freq: Four times a day (QID) | ORAL | Status: DC | PRN
  Administered 2023-11-09: 650 mg via ORAL
  Filled 2023-11-07: qty 2

## 2023-11-07 MED ORDER — TRAZODONE HCL 100 MG PO TABS: 100.0000 mg | ORAL_TABLET | Freq: Every evening | ORAL | Status: DC | PRN

## 2023-11-07 MED ORDER — ALUM & MAG HYDROXIDE-SIMETH 200-200-20 MG/5ML PO SUSP: 30.0000 mL | ORAL | Status: DC | PRN

## 2023-11-07 NOTE — ED Notes (Signed)
 Pt is in his room resting in bed. Pt denies SI/HI/AVH. No acute distress noted. Will continue to monitor for safety.

## 2023-11-07 NOTE — ED Notes (Signed)
 Patient is sleeping. Respirations equal and unlabored, skin warm and dry. No change in assessment or acuity. Routine safety checks conducted according to facility protocol. Will continue to monitor for safety.

## 2023-11-07 NOTE — ED Notes (Signed)
 Patient admitted to [facility] for substance abuse disorder - cocaine.  He is calm and cooperative.  He signed voluntary admission and rules/policy of unit provided.  Patient verbalized understanding of information provided.  He is A&O to person, place, time and situation.  He has been accepted to [facility] on 11/09/23 and has been admitted to [facility] to await open bed.    Patient performs his ADLs without issue.

## 2023-11-07 NOTE — Care Management (Addendum)
 OBS Care Management   Writer has been referred to [facility].  Patient declined referrals being sent to [facility] in [place]  and [facility] in [place] .   Writer informed the NP working with the patient.   2:48pm  Per the intake worker at [facility] the patient referral packet is being reviewed.   4:17pm   Patient has been accepted to [facility] on Wednesday November 09, 2023, Clinical Research Associate spoke to Lauraine Leaks     Patient is in agreement with going to [facility].  Patient will have family to bring his clothes to him.  Writer informed the NP.  Patient will be transferred to the [facility].

## 2023-11-07 NOTE — Group Note (Signed)
 Group Topic: Overcoming Obstacles  Group Date: 11/07/2023 Start Time: 2000 End Time: 2045 Facilitators: Joan Plowman B  Department: [facility]  Number of Participants: 1  Group Focus: activities of daily living skills, check in, communication, coping skills, daily focus, feeling awareness/expression, goals/reality orientation, and individual meeting Treatment Modality:  Leisure Development Interventions utilized were assignment, leisure development, problem solving, and support Purpose: express feelings, increase insight, regain self-worth, and reinforce self-care  Name: Robert Chan Date of Birth: 02/27/1956  MR: [id number]    Level of Participation: PT DID NOT ATTEND GROUP Quality of Participation: cooperative Interactions with others: gave feedback Mood/Affect: appropriate Triggers (if applicable): NA Cognition: coherent/clear Progress: None Response: NA Plan: patient will be encouraged to go to groups while he is here.   Patients Problems:  Patient Active Problem List   Diagnosis Date Noted   Cocaine abuse (HCC) 10/12/2023   MDD (major depressive disorder), recurrent episode, severe (HCC) 10/11/2023   AKI (acute kidney injury) (HCC) 10/10/2023   Alcohol use with alcohol-induced mood disorder (HCC) 09/16/2023   MDD (major depressive disorder), recurrent severe, without psychosis (HCC) 08/08/2023   MDD (major depressive disorder), recurrent episode, mild (HCC) 07/12/2023   Cocaine use 05/25/2023   PAD (peripheral artery disease) (HCC) 12/01/2021   Atopic dermatitis 04/22/2021   Former tobacco use    Polysubstance abuse (HCC) 09/06/2020   Vitamin D  deficiency 06/23/2020   Coronary artery disease 06/19/2020   History of non-ST elevation myocardial infarction (NSTEMI)    Foot callus 12/12/2019   Age-related nuclear cataract of right eye 09/13/2016   Benign hypertension 07/06/2016   Glaucoma 07/06/2016   Male erectile disorder 07/06/2016    Asthma 10/18/2012   

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
## 2023-11-07 NOTE — ED Notes (Signed)
 Provider is in 134 with the pt

## 2023-11-08 DIAGNOSIS — F331 Major depressive disorder, recurrent, moderate: Secondary | ICD-10-CM | POA: Diagnosis not present

## 2023-11-08 DIAGNOSIS — F1420 Cocaine dependence, uncomplicated: Secondary | ICD-10-CM | POA: Diagnosis not present

## 2023-11-08 LAB — CBC WITH DIFFERENTIAL/PLATELET
Abs Immature Granulocytes: 0.02 10*3/uL (ref 0.00–0.07)
Basophils Absolute: 0 10*3/uL (ref 0.0–0.1)
Basophils Relative: 1 %
Eosinophils Absolute: 0.3 10*3/uL (ref 0.0–0.5)
Eosinophils Relative: 4 %
HCT: 40 % (ref 39.0–52.0)
Hemoglobin: 12.8 g/dL — ABNORMAL LOW (ref 13.0–17.0)
Immature Granulocytes: 0 %
Lymphocytes Relative: 40 %
Lymphs Abs: 3.3 10*3/uL (ref 0.7–4.0)
MCH: 29.9 pg (ref 26.0–34.0)
MCHC: 32 g/dL (ref 30.0–36.0)
MCV: 93.5 fL (ref 80.0–100.0)
Monocytes Absolute: 0.7 10*3/uL (ref 0.1–1.0)
Monocytes Relative: 8 %
Neutro Abs: 3.8 10*3/uL (ref 1.7–7.7)
Neutrophils Relative %: 47 %
Platelets: 299 10*3/uL (ref 150–400)
RBC: 4.28 MIL/uL (ref 4.22–5.81)
RDW: 12.9 % (ref 11.5–15.5)
WBC: 8.2 10*3/uL (ref 4.0–10.5)
nRBC: 0 % (ref 0.0–0.2)

## 2023-11-08 LAB — COMPREHENSIVE METABOLIC PANEL
ALT: 18 U/L (ref 0–44)
AST: 20 U/L (ref 15–41)
Albumin: 3.2 g/dL — ABNORMAL LOW (ref 3.5–5.0)
Alkaline Phosphatase: 75 U/L (ref 38–126)
Anion gap: 9 (ref 5–15)
BUN: 14 mg/dL (ref 8–23)
CO2: 24 mmol/L (ref 22–32)
Calcium: 8.9 mg/dL (ref 8.9–10.3)
Chloride: 104 mmol/L (ref 98–111)
Creatinine, Ser: 1.25 mg/dL — ABNORMAL HIGH (ref 0.61–1.24)
GFR, Estimated: >60 mL/min (ref >60)
Glucose, Bld: 96 mg/dL (ref 70–99)
Potassium: 4.2 mmol/L (ref 3.5–5.1)
Sodium: 137 mmol/L (ref 135–145)
Total Bilirubin: 0.9 mg/dL (ref 0.0–1.2)
Total Protein: 7.9 g/dL (ref 6.5–8.1)

## 2023-11-08 MED ORDER — DILTIAZEM HCL ER COATED BEADS 180 MG PO CP24
180.0000 mg | ORAL_CAPSULE | Freq: Every day | ORAL | Status: DC
  Administered 2023-11-08 – 2023-11-09 (×2): 180 mg via ORAL
  Filled 2023-11-08 (×2): qty 1

## 2023-11-08 MED ORDER — MELATONIN 5 MG PO TABS
5.0000 mg | ORAL_TABLET | Freq: Every day | ORAL | Status: DC
  Administered 2023-11-08: 5 mg via ORAL
  Filled 2023-11-08: qty 1

## 2023-11-08 MED ORDER — THIAMINE MONONITRATE 100 MG PO TABS
100.0000 mg | ORAL_TABLET | Freq: Every day | ORAL | Status: DC
  Administered 2023-11-09: 100 mg via ORAL
  Filled 2023-11-08: qty 1

## 2023-11-08 MED ORDER — PANTOPRAZOLE SODIUM 20 MG PO TBEC
20.0000 mg | DELAYED_RELEASE_TABLET | Freq: Every day | ORAL | Status: DC
  Administered 2023-11-08 – 2023-11-09 (×2): 20 mg via ORAL
  Filled 2023-11-08 (×2): qty 1

## 2023-11-08 MED ORDER — MOMETASONE FURO-FORMOTEROL FUM 200-5 MCG/ACT IN AERO
2.0000 | INHALATION_SPRAY | Freq: Two times a day (BID) | RESPIRATORY_TRACT | Status: DC
  Administered 2023-11-08 – 2023-11-09 (×3): 2 via RESPIRATORY_TRACT
  Filled 2023-11-08: qty 8.8

## 2023-11-08 MED ORDER — THIAMINE HCL 100 MG/ML IJ SOLN: 100.0000 mg | Freq: Once | INTRAMUSCULAR | Status: DC

## 2023-11-08 MED ORDER — ALBUTEROL SULFATE HFA 108 (90 BASE) MCG/ACT IN AERS: 2.0000 | INHALATION_SPRAY | Freq: Four times a day (QID) | RESPIRATORY_TRACT | Status: DC | PRN

## 2023-11-08 MED ORDER — SENNOSIDES-DOCUSATE SODIUM 8.6-50 MG PO TABS: 1.0000 | ORAL_TABLET | Freq: Every evening | ORAL | Status: DC | PRN

## 2023-11-08 MED ORDER — ATORVASTATIN CALCIUM 40 MG PO TABS
80.0000 mg | ORAL_TABLET | Freq: Every day | ORAL | Status: DC
  Administered 2023-11-08 – 2023-11-09 (×2): 80 mg via ORAL
  Filled 2023-11-08 (×2): qty 2

## 2023-11-08 MED ORDER — MIRTAZAPINE 30 MG PO TABS
30.0000 mg | ORAL_TABLET | Freq: Every day | ORAL | Status: DC
  Administered 2023-11-08: 30 mg via ORAL
  Filled 2023-11-08: qty 1

## 2023-11-08 MED ORDER — LORAZEPAM 1 MG PO TABS: 1.0000 mg | ORAL_TABLET | Freq: Four times a day (QID) | ORAL | Status: DC | PRN

## 2023-11-08 MED ORDER — ADULT MULTIVITAMIN W/MINERALS CH
1.0000 | ORAL_TABLET | Freq: Every day | ORAL | Status: DC
  Administered 2023-11-08 – 2023-11-09 (×2): 1 via ORAL
  Filled 2023-11-08 (×2): qty 1

## 2023-11-08 MED ORDER — ARIPIPRAZOLE 15 MG PO TABS
15.0000 mg | ORAL_TABLET | Freq: Every day | ORAL | Status: DC
  Administered 2023-11-08 – 2023-11-09 (×2): 15 mg via ORAL
  Filled 2023-11-08 (×2): qty 1

## 2023-11-08 MED ORDER — LATANOPROST 0.005 % OP SOLN
1.0000 [drp] | Freq: Every day | OPHTHALMIC | Status: DC
  Administered 2023-11-08: 1 [drp] via OPHTHALMIC
  Filled 2023-11-08: qty 2.5

## 2023-11-08 MED ORDER — LOPERAMIDE HCL 2 MG PO CAPS: 2.0000 mg | ORAL_CAPSULE | ORAL | Status: DC | PRN

## 2023-11-08 NOTE — ED Notes (Signed)
 Pt is in the dayroom watching TV with peers. Pt denies SI/HI/AVH. Pt has no further complain.No acute distress noted. Will continue to monitor for safety and provide support.

## 2023-11-08 NOTE — Group Note (Signed)
 Group Topic: Recovery Basics  Group Date: 11/08/2023 Start Time: 0800 End Time: 0830 Facilitators: Judi Monico RAMAN, NT  Department: [facility]  Number of Participants: 4  Group Focus: check in Treatment Modality:  Psychoeducation Interventions utilized were exploration, patient education, and support Purpose: express feelings and increase insight  Name: Robert Chan Date of Birth: Nov 30, 1955  MR: [id number]    Level of Participation: minimal Quality of Participation: quiet Interactions with others: gave feedback Mood/Affect: appropriate and positive Triggers (if applicable): n/a Cognition: coherent/clear and goal directed Progress: Gaining insight Response: Pt was able to share his goals for the day and express his feelings towards his discharge to [facility]. Plan: patient will be encouraged to attend future groups.  Patients Problems:  Patient Active Problem List   Diagnosis Date Noted   Cocaine abuse (HCC) 10/12/2023   MDD (major depressive disorder), recurrent episode, severe (HCC) 10/11/2023   AKI (acute kidney injury) (HCC) 10/10/2023   Alcohol use with alcohol-induced mood disorder (HCC) 09/16/2023   MDD (major depressive disorder), recurrent severe, without psychosis (HCC) 08/08/2023   MDD (major depressive disorder), recurrent episode, mild (HCC) 07/12/2023   Cocaine use 05/25/2023   PAD (peripheral artery disease) (HCC) 12/01/2021   Atopic dermatitis 04/22/2021   Former tobacco use    Polysubstance abuse (HCC) 09/06/2020   Vitamin D  deficiency 06/23/2020   Coronary artery disease 06/19/2020   History of non-ST elevation myocardial infarction (NSTEMI)    Foot callus 12/12/2019   Age-related nuclear cataract of right eye 09/13/2016   Benign hypertension 07/06/2016   Glaucoma 07/06/2016   Male erectile disorder 07/06/2016   Asthma 10/18/2012   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

## 2023-11-08 NOTE — Care Management (Signed)
 [facility] Care Management   Lauraine Leaks from [facility] reports that they will have transportation come to the [facility] and pick up the patient and transport him to to their facility on Wednesday November 09, 2023,    The facility will call on Wednesday to confirm the time that the driver will be leaving [place] to come to [place].

## 2023-11-08 NOTE — Progress Notes (Signed)
 Patient is calm and cooperative with care.  He has been up and out of his room for most of the shift.  He interacts appropriately with staff and peers.  He has asked staff for any clothing that he may have to take with him to [facility] treatment center when he is discharged tomorrow.

## 2023-11-08 NOTE — ED Notes (Signed)
 Patient is sleeping. Respirations equal and unlabored, skin warm and dry. No change in assessment or acuity. Routine safety checks conducted according to facility protocol. Will continue to monitor for safety.

## 2023-11-08 NOTE — ED Notes (Signed)
 Pt was provided breakfast.

## 2023-11-08 NOTE — Group Note (Signed)
 Group Topic: Recovery Basics  Group Date: 11/08/2023 Start Time: 2030 End Time: 2100 Facilitators: Joshua Ellouise CROME  Department: [facility]  Number of Participants:   Group Focus:  Treatment Modality:   Interventions utilized were  Purpose:   Name: Robert Chan Date of Birth: [birth year]  MR: [id number]    Level of Participation: Did not attend Quality of Participation:  Interactions with others:  Mood/Affect:  Triggers (if applicable):  Cognition:  Progress:  Response:  Plan:   Patients Problems:  Patient Active Problem List   Diagnosis Date Noted   Cocaine abuse (HCC) 10/12/2023   MDD (major depressive disorder), recurrent episode, severe (HCC) 10/11/2023   AKI (acute kidney injury) (HCC) 10/10/2023   Alcohol use with alcohol-induced mood disorder (HCC) 09/16/2023   MDD (major depressive disorder), recurrent severe, without psychosis (HCC) 08/08/2023   MDD (major depressive disorder), recurrent episode, mild (HCC) 07/12/2023   Cocaine use 05/25/2023   PAD (peripheral artery disease) (HCC) 12/01/2021   Atopic dermatitis 04/22/2021   Former tobacco use    Polysubstance abuse (HCC) 09/06/2020   Vitamin D  deficiency 06/23/2020   Coronary artery disease 06/19/2020   History of non-ST elevation myocardial infarction (NSTEMI)    Foot callus 12/12/2019   Age-related nuclear cataract of right eye 09/13/2016   Benign hypertension 07/06/2016   Glaucoma 07/06/2016   Male erectile disorder 07/06/2016   Asthma 10/18/2012   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

## 2023-11-08 NOTE — ED Notes (Signed)
 PATIENT WAS PROVIDED DINNER

## 2023-11-08 NOTE — ED Notes (Signed)
 Pt was provided lunch

## 2023-11-08 NOTE — ED Notes (Signed)
 Patient reported no issues overnight.  He is calm and cooperative with care.  He is compliant with his scheduled medications as ordered.  He is alert to person, place, time and situation.  He is scheduled to discharge to [facility] tomorrow 11/09/23.

## 2023-11-08 NOTE — Discharge Planning (Signed)
 LCSW spoke with patient regarding dispositions and current needs at this time. Patient reports he is in need of clothing and reports he is unsure if he will be able to go to [facility] without any clothes. Patient reports he has attempted to reach out to his daughter to see if she could help him with clothing, however reports his daughter is out of town. Patient was informed that staff could check the clothing closet here at the [facility] and [facility] to see if there are items to provide. LCSW will also follow up with facility to explore options for clothing at their facility.   LCSW contacted [facility] and spoke to JD. Per JD, their agency has a clothing closet available for those who do not have clothing to bring. Patient was informed of this information and was appreciative of LCSW assistance. Patient aware that agency will call in the morning regarding ETA. No other needs were reported at this time.   Merlynn Lazier, LCSW Clinical Social Worker [facility] Ph: [phone]

## 2023-11-08 NOTE — ED Provider Notes (Addendum)
 Behavioral Health Progress Note  Date and Time: 11/08/2023 12:07 PM Name: Robert Chan MRN:  [id number]  Subjective:   Robert Chan is a 68 yo male with PPHx MDD, alcohol use disorder, and cocaine use disorder presenting to [facility] seeking assistance with substance use issues. Past medical history of atherosclerotic CAD, COPD, hypertension, history of NSTEMI 06/2020, underwent Left cardiac cath showed 30% prox LAD stenosis and 20% mid RCA stenosis .  Patient has been accepted to [facility] on Wednesday November 09, 2023.  Patient evaluated at bedside. Reports sleep is ok. Reports appetite is ok. States mood is alright today. He reports he trying to get someone to get some of his clothes form his apartment and admits to feeling hesitant about going to home treatment center if he does not have his close. He reports daughter and sister are not in town.   Patient reports he is not experiencing any cravings. On interview, suicidal ideations are not present. Thoughts of self harm are not present. Homicidal ideations are not present. There are no auditory hallucinations, visual hallucinations, paranoid ideations, or delusional thought processes.   Side effects to currently prescribed medications are none. There are no somatic complaints. Reports regular bowel movements.. There are no withdrawals reported today.    Diagnosis:  Final diagnoses:  None    Total Time spent with patient: 30 minutes  Past Psychiatric History: MDD, Cocaine Use disorder, Alcohol use disorder, severe. Multiple inpatient psychiatric hospitalizations, last admission 10/11/2023   Family History:  Patient reports history brothers committed suicide by overdose on drugs  Social History: Patient reports he is on Social Security disability income.  He reports he gets around $1000/month.  He lives alone in an apartment   Current Medications:  Current Facility-Administered Medications   Medication Dose Route Frequency Provider Last Rate Last Admin   acetaminophen  (TYLENOL ) tablet 650 mg  650 mg Oral Q6H PRN Starkes-Perry, Majel RAMAN, FNP       albuterol  (VENTOLIN  HFA) 108 (90 Base) MCG/ACT inhaler 2 puff  2 puff Inhalation Q6H PRN Carrion-Carrero, Jona Zappone, MD       alum & mag hydroxide-simeth (MAALOX/MYLANTA) 200-200-20 MG/5ML suspension 30 mL  30 mL Oral Q4H PRN Starkes-Perry, Takia S, FNP       ARIPiprazole  (ABILIFY ) tablet 15 mg  15 mg Oral QPC breakfast Carrion-Carrero, Maurisha Mongeau, MD   15 mg at 11/08/23 0934   atorvastatin  (LIPITOR ) tablet 80 mg  80 mg Oral Daily Carrion-Carrero, Marlo, MD   80 mg at 11/08/23 9065   diltiazem  (CARDIZEM  CD) 24 hr capsule 180 mg  180 mg Oral Daily Carrion-Carrero, Marlo, MD   180 mg at 11/08/23 9065   hydrOXYzine  (ATARAX ) tablet 25 mg  25 mg Oral TID PRN Starkes-Perry, Takia S, FNP       latanoprost  (XALATAN ) 0.005 % ophthalmic solution 1 drop  1 drop Both Eyes QHS Carrion-Carrero, Margaretmary Prisk, MD       magnesium  hydroxide (MILK OF MAGNESIA) suspension 30 mL  30 mL Oral Daily PRN Starkes-Perry, Takia S, FNP       melatonin tablet 5 mg  5 mg Oral QHS Carrion-Carrero, Norlan Rann, MD       mirtazapine  (REMERON ) tablet 30 mg  30 mg Oral QHS Carrion-Carrero, Sophiya Morello, MD       mometasone -formoterol  (DULERA ) 200-5 MCG/ACT inhaler 2 puff  2 puff Inhalation BID Carrion-Carrero, Cydne Grahn, MD   2 puff at 11/08/23 0934   pantoprazole  (PROTONIX ) EC tablet 20 mg  20 mg Oral  Daily Carrion-Carrero, Marlo, MD   20 mg at 11/08/23 0934   senna-docusate (Senokot-S) tablet 1 tablet  1 tablet Oral QHS PRN Homer Marlo, MD       Current Outpatient Medications  Medication Sig Dispense Refill   albuterol  (VENTOLIN  HFA) 108 (90 Base) MCG/ACT inhaler Inhale 2 puffs into the lungs every 6 (six) hours as needed for shortness of breath. 18 g 3   ARIPiprazole  (ABILIFY ) 15 MG tablet Take 1 tablet (15 mg total) by mouth daily after breakfast. 30 tablet 3   atorvastatin   (LIPITOR ) 80 MG tablet Take 1 tablet (80 mg total) by mouth daily. 30 tablet 3   diltiazem  (CARDIZEM  CD) 180 MG 24 hr capsule Take 1 capsule (180 mg total) by mouth daily. 30 capsule 3   latanoprost  (XALATAN ) 0.005 % ophthalmic solution Place 1 drop into both eyes at bedtime. 2.5 mL 12   melatonin 5 MG TABS Take 1 tablet (5 mg total) by mouth at bedtime. 30 tablet 3   mirtazapine  (REMERON ) 30 MG tablet Take 1 tablet (30 mg total) by mouth at bedtime. 30 tablet 3   mometasone -formoterol  (DULERA ) 200-5 MCG/ACT AERO Inhale 2 puffs into the lungs 2 (two) times daily. 1 each 3   pantoprazole  (PROTONIX ) 20 MG tablet Take 1 tablet (20 mg total) by mouth daily. 30 tablet 3   senna-docusate (SENOKOT-S) 8.6-50 MG tablet Take 1 tablet by mouth at bedtime as needed for mild constipation. (Patient not taking: Reported on 11/08/2023) 30 tablet 3    Labs  Lab Results:  Admission on 11/07/2023  Component Date Value Ref Range Status   WBC 11/08/2023 8.2  4.0 - 10.5 K/uL Final   RBC 11/08/2023 4.28  4.22 - 5.81 MIL/uL Final   Hemoglobin 11/08/2023 12.8 (L)  13.0 - 17.0 g/dL Final   HCT 98/92/7974 40.0  39.0 - 52.0 % Final   MCV 11/08/2023 93.5  80.0 - 100.0 fL Final   MCH 11/08/2023 29.9  26.0 - 34.0 pg Final   MCHC 11/08/2023 32.0  30.0 - 36.0 g/dL Final   RDW 98/92/7974 12.9  11.5 - 15.5 % Final   Platelets 11/08/2023 299  150 - 400 K/uL Final   nRBC 11/08/2023 0.0  0.0 - 0.2 % Final   Neutrophils Relative % 11/08/2023 47  % Final   Neutro Abs 11/08/2023 3.8  1.7 - 7.7 K/uL Final   Lymphocytes Relative 11/08/2023 40  % Final   Lymphs Abs 11/08/2023 3.3  0.7 - 4.0 K/uL Final   Monocytes Relative 11/08/2023 8  % Final   Monocytes Absolute 11/08/2023 0.7  0.1 - 1.0 K/uL Final   Eosinophils Relative 11/08/2023 4  % Final   Eosinophils Absolute 11/08/2023 0.3  0.0 - 0.5 K/uL Final   Basophils Relative 11/08/2023 1  % Final   Basophils Absolute 11/08/2023 0.0  0.0 - 0.1 K/uL Final   Immature Granulocytes  11/08/2023 0  % Final   Abs Immature Granulocytes 11/08/2023 0.02  0.00 - 0.07 K/uL Final   Performed at [facility] Lab, [address], [place], [place] [address]   Sodium 11/08/2023 137  135 - 145 mmol/L Final   Potassium 11/08/2023 4.2  3.5 - 5.1 mmol/L Final   Chloride 11/08/2023 104  98 - 111 mmol/L Final   CO2 11/08/2023 24  22 - 32 mmol/L Final   Glucose, Bld 11/08/2023 96  70 - 99 mg/dL Final   Glucose reference range applies only to samples taken after fasting for  at least 8 hours.   BUN 11/08/2023 14  8 - 23 mg/dL Final   Creatinine, Ser 11/08/2023 1.25 (H)  0.61 - 1.24 mg/dL Final   Calcium  11/08/2023 8.9  8.9 - 10.3 mg/dL Final   Total Protein 98/92/7974 7.9  6.5 - 8.1 g/dL Final   Albumin 98/92/7974 3.2 (L)  3.5 - 5.0 g/dL Final   AST 98/92/7974 20  15 - 41 U/L Final   ALT 11/08/2023 18  0 - 44 U/L Final   Alkaline Phosphatase 11/08/2023 75  38 - 126 U/L Final   Total Bilirubin 11/08/2023 0.9  0.0 - 1.2 mg/dL Final   GFR, Estimated 11/08/2023 >60  >60 mL/min Final   Comment: (NOTE) Calculated using the CKD-EPI Creatinine Equation (2021)    Anion gap 11/08/2023 9  5 - 15 Final   Performed at [facility], [address], [place], [place] [address]   POC Amphetamine UR 11/07/2023 None Detected  NONE DETECTED (Cut Off Level 1000 ng/mL) Final   POC Secobarbital (BAR) 11/07/2023 None Detected  NONE DETECTED (Cut Off Level 300 ng/mL) Final   POC Buprenorphine (BUP) 11/07/2023 None Detected  NONE DETECTED (Cut Off Level 10 ng/mL) Final   POC Oxazepam (BZO) 11/07/2023 None Detected  NONE DETECTED (Cut Off Level 300 ng/mL) Final   POC Cocaine UR 11/07/2023 Positive (A)  NONE DETECTED (Cut Off Level 300 ng/mL) Final   POC Methamphetamine UR 11/07/2023 None Detected  NONE DETECTED (Cut Off Level 1000 ng/mL) Final   POC Morphine 11/07/2023 None Detected  NONE DETECTED (Cut Off Level 300 ng/mL) Final   POC Methadone UR 11/07/2023 None Detected  NONE DETECTED (Cut Off Level  300 ng/mL) Final   POC Oxycodone  UR 11/07/2023 None Detected  NONE DETECTED (Cut Off Level 100 ng/mL) Final   POC Marijuana UR 11/07/2023 None Detected  NONE DETECTED (Cut Off Level 50 ng/mL) Final   Color, Urine 11/07/2023 AMBER (A)  YELLOW Final   BIOCHEMICALS MAY BE AFFECTED BY COLOR   APPearance 11/07/2023 HAZY (A)  CLEAR Final   Specific Gravity, Urine 11/07/2023 1.025  1.005 - 1.030 Final   pH 11/07/2023 5.0  5.0 - 8.0 Final   Glucose, UA 11/07/2023 NEGATIVE  NEGATIVE mg/dL Final   Hgb urine dipstick 11/07/2023 NEGATIVE  NEGATIVE Final   Bilirubin Urine 11/07/2023 NEGATIVE  NEGATIVE Final   Ketones, ur 11/07/2023 NEGATIVE  NEGATIVE mg/dL Final   Protein, ur 98/93/7974 NEGATIVE  NEGATIVE mg/dL Final   Nitrite 98/93/7974 NEGATIVE  NEGATIVE Final   Leukocytes,Ua 11/07/2023 TRACE (A)  NEGATIVE Final   RBC / HPF 11/07/2023 0-5  0 - 5 RBC/hpf Final   WBC, UA 11/07/2023 0-5  0 - 5 WBC/hpf Final   Bacteria, UA 11/07/2023 RARE (A)  NONE SEEN Final   Squamous Epithelial / HPF 11/07/2023 0-5  0 - 5 /HPF Final   Mucus 11/07/2023 PRESENT   Final   Hyaline Casts, UA 11/07/2023 PRESENT   Final   Ca Oxalate Crys, UA 11/07/2023 PRESENT   Final   Performed at [facility], [address] [place], [place] [address]  Admission on 11/07/2023, Discharged on 11/07/2023  Component Date Value Ref Range Status   POC Amphetamine UR 11/07/2023 None Detected  NONE DETECTED (Cut Off Level 1000 ng/mL) Final   POC Secobarbital (BAR) 11/07/2023 None Detected  NONE DETECTED (Cut Off Level 300 ng/mL) Final   POC Buprenorphine (BUP) 11/07/2023 None Detected  NONE DETECTED (Cut Off Level 10 ng/mL)  Final   POC Oxazepam (BZO) 11/07/2023 None Detected  NONE DETECTED (Cut Off Level 300 ng/mL) Final   POC Cocaine UR 11/07/2023 Positive (A)  NONE DETECTED (Cut Off Level 300 ng/mL) Final   POC Methamphetamine UR 11/07/2023 None Detected  NONE DETECTED (Cut Off Level 1000 ng/mL) Final   POC Morphine 11/07/2023 None  Detected  NONE DETECTED (Cut Off Level 300 ng/mL) Final   POC Methadone UR 11/07/2023 None Detected  NONE DETECTED (Cut Off Level 300 ng/mL) Final   POC Oxycodone  UR 11/07/2023 None Detected  NONE DETECTED (Cut Off Level 100 ng/mL) Final   POC Marijuana UR 11/07/2023 None Detected  NONE DETECTED (Cut Off Level 50 ng/mL) Final  Admission on 10/10/2023, Discharged on 10/11/2023  Component Date Value Ref Range Status   Sodium 10/10/2023 137  135 - 145 mmol/L Final   Potassium 10/10/2023 3.6  3.5 - 5.1 mmol/L Final   Chloride 10/10/2023 108  98 - 111 mmol/L Final   CO2 10/10/2023 20 (L)  22 - 32 mmol/L Final   Glucose, Bld 10/10/2023 113 (H)  70 - 99 mg/dL Final   Glucose reference range applies only to samples taken after fasting for at least 8 hours.   BUN 10/10/2023 13  8 - 23 mg/dL Final   Creatinine, Ser 10/10/2023 2.09 (H)  0.61 - 1.24 mg/dL Final   Calcium  10/10/2023 8.8 (L)  8.9 - 10.3 mg/dL Final   Total Protein 87/90/7975 7.3  6.5 - 8.1 g/dL Final   Albumin 87/90/7975 3.5  3.5 - 5.0 g/dL Final   AST 87/90/7975 20  15 - 41 U/L Final   ALT 10/10/2023 14  0 - 44 U/L Final   Alkaline Phosphatase 10/10/2023 72  38 - 126 U/L Final   Total Bilirubin 10/10/2023 0.8  <1.2 mg/dL Final   GFR, Estimated 10/10/2023 34 (L)  >60 mL/min Final   Comment: (NOTE) Calculated using the CKD-EPI Creatinine Equation (2021)    Anion gap 10/10/2023 9  5 - 15 Final   Performed at [facility], [address] [place], [place] [address]   Alcohol, Ethyl (B) 10/10/2023 <10  <10 mg/dL Final   Comment: (NOTE) Lowest detectable limit for serum alcohol is 10 mg/dL.  For medical purposes only. Performed at [facility], [address], [place], [place] [address]    Salicylate Lvl 10/10/2023 <7.0 (L)  7.0 - 30.0 mg/dL Final   Performed at [facility], [address] [place], [place] [address]   Acetaminophen  (Tylenol ), Serum 10/10/2023 <10 (L)  10 - 30 ug/mL Final   Comment:  (NOTE) Therapeutic concentrations vary significantly. A range of 10-30 ug/mL  may be an effective concentration for many patients. However, some  are best treated at concentrations outside of this range. Acetaminophen  concentrations >150 ug/mL at 4 hours after ingestion  and >50 ug/mL at 12 hours after ingestion are often associated with  toxic reactions.  Performed at [facility], [address] [place], [place] [address]    WBC 10/10/2023 7.7  4.0 - 10.5 K/uL Final   RBC 10/10/2023 4.30  4.22 - 5.81 MIL/uL Final   Hemoglobin 10/10/2023 13.0  13.0 - 17.0 g/dL Final   HCT 87/90/7975 40.4  39.0 - 52.0 % Final   MCV 10/10/2023 94.0  80.0 - 100.0 fL Final   MCH 10/10/2023 30.2  26.0 - 34.0 pg Final   MCHC 10/10/2023 32.2  30.0 - 36.0 g/dL Final  RDW 10/10/2023 13.2  11.5 - 15.5 % Final   Platelets 10/10/2023 289  150 - 400 K/uL Final   nRBC 10/10/2023 0.0  0.0 - 0.2 % Final   Performed at [facility], [address] [place], [place] [address]   Troponin I (High Sensitivity) 10/10/2023 11  <18 ng/L Final   Comment: (NOTE) Elevated high sensitivity troponin I (hsTnI) values and significant  changes across serial measurements may suggest ACS but many other  chronic and acute conditions are known to elevate hsTnI results.  Refer to the Links section for chest pain algorithms and additional  guidance. Performed at [facility], [address], [place], [place] [address]    HIV Screen 4th Generation wRfx 10/11/2023 Non Reactive  Non Reactive Final   Performed at [facility], [address] [place], [place] [address]   Opiates 10/10/2023 NONE DETECTED  NONE DETECTED Final   Cocaine 10/10/2023 POSITIVE (A)  NONE DETECTED Final   Benzodiazepines 10/10/2023 NONE DETECTED  NONE DETECTED Final   Amphetamines 10/10/2023 NONE DETECTED  NONE DETECTED Final   Tetrahydrocannabinol 10/10/2023 NONE DETECTED  NONE DETECTED Final   Barbiturates 10/10/2023 NONE DETECTED   NONE DETECTED Final   Comment: (NOTE) DRUG SCREEN FOR MEDICAL PURPOSES ONLY.  IF CONFIRMATION IS NEEDED FOR ANY PURPOSE, NOTIFY LAB WITHIN 5 DAYS.  LOWEST DETECTABLE LIMITS FOR URINE DRUG SCREEN Drug Class                     Cutoff (ng/mL) Amphetamine and metabolites    1000 Barbiturate and metabolites    200 Benzodiazepine                 200 Opiates and metabolites        300 Cocaine and metabolites        300 THC                            50 Performed at [facility], [address] [place], [place] [address]    Sodium 10/11/2023 137  135 - 145 mmol/L Final   Potassium 10/11/2023 3.7  3.5 - 5.1 mmol/L Final   Chloride 10/11/2023 108  98 - 111 mmol/L Final   CO2 10/11/2023 23  22 - 32 mmol/L Final   Glucose, Bld 10/11/2023 116 (H)  70 - 99 mg/dL Final   Glucose reference range applies only to samples taken after fasting for at least 8 hours.   BUN 10/11/2023 12  8 - 23 mg/dL Final   Creatinine, Ser 10/11/2023 1.17  0.61 - 1.24 mg/dL Final   Calcium  10/11/2023 8.3 (L)  8.9 - 10.3 mg/dL Final   GFR, Estimated 10/11/2023 >60  >60 mL/min Final   Comment: (NOTE) Calculated using the CKD-EPI Creatinine Equation (2021)    Anion gap 10/11/2023 6  5 - 15 Final   Performed at [facility], [address] [place], [place] [address]   WBC 10/11/2023 8.1  4.0 - 10.5 K/uL Final   RBC 10/11/2023 3.88 (L)  4.22 - 5.81 MIL/uL Final   Hemoglobin 10/11/2023 11.6 (L)  13.0 - 17.0 g/dL Final   HCT 87/89/7975 36.2 (L)  39.0 - 52.0 % Final   MCV 10/11/2023 93.3  80.0 - 100.0 fL Final   MCH 10/11/2023 29.9  26.0 - 34.0 pg Final   MCHC 10/11/2023 32.0  30.0 - 36.0 g/dL Final  RDW 10/11/2023 13.2  11.5 - 15.5 % Final   Platelets 10/11/2023 226  150 - 400 K/uL Final   nRBC 10/11/2023 0.0  0.0 - 0.2 % Final   Performed at [facility], [address], [place], [place] [address]   SARS Coronavirus 2 by RT PCR 10/11/2023 NEGATIVE  NEGATIVE Final   Influenza A by PCR  10/11/2023 NEGATIVE  NEGATIVE Final   Influenza B by PCR 10/11/2023 NEGATIVE  NEGATIVE Final   Comment: (NOTE) The Xpert Xpress SARS-CoV-2/FLU/RSV plus assay is intended as an aid in the diagnosis of influenza from Nasopharyngeal swab specimens and should not be used as a sole basis for treatment. Nasal washings and aspirates are unacceptable for Xpert Xpress SARS-CoV-2/FLU/RSV testing.  Fact Sheet for Patients: bloggercourse.com  Fact Sheet for Healthcare Providers: seriousbroker.it  This test is not yet approved or cleared by the [place]  FDA and has been authorized for detection and/or diagnosis of SARS-CoV-2 by FDA under an Emergency Use Authorization (EUA). This EUA will remain in effect (meaning this test can be used) for the duration of the COVID-19 declaration under Section 564(b)(1) of the Act, 21 U.S.C. section 360bbb-3(b)(1), unless the authorization is terminated or revoked.     Resp Syncytial Virus by PCR 10/11/2023 NEGATIVE  NEGATIVE Final   Comment: (NOTE) Fact Sheet for Patients: bloggercourse.com  Fact Sheet for Healthcare Providers: seriousbroker.it  This test is not yet approved or cleared by the [place]  FDA and has been authorized for detection and/or diagnosis of SARS-CoV-2 by FDA under an Emergency Use Authorization (EUA). This EUA will remain in effect (meaning this test can be used) for the duration of the COVID-19 declaration under Section 564(b)(1) of the Act, 21 U.S.C. section 360bbb-3(b)(1), unless the authorization is terminated or revoked.  Performed at [facility], [address], [place], [place] [address]   [facility]regational Nurse Program on 09/27/2023  Component Date Value Ref Range Status   POC Glucose 09/27/2023 102 (A)  70 - 99 mg/dl Final  Admission on 88/85/7975, Discharged on 09/16/2023  Component Date Value Ref Range  Status   WBC 09/15/2023 8.9  4.0 - 10.5 K/uL Final   RBC 09/15/2023 4.35  4.22 - 5.81 MIL/uL Final   Hemoglobin 09/15/2023 13.0  13.0 - 17.0 g/dL Final   HCT 88/85/7975 40.7  39.0 - 52.0 % Final   MCV 09/15/2023 93.6  80.0 - 100.0 fL Final   MCH 09/15/2023 29.9  26.0 - 34.0 pg Final   MCHC 09/15/2023 31.9  30.0 - 36.0 g/dL Final   RDW 88/85/7975 12.6  11.5 - 15.5 % Final   Platelets 09/15/2023 194  150 - 400 K/uL Final   nRBC 09/15/2023 0.0  0.0 - 0.2 % Final   Neutrophils Relative % 09/15/2023 52  % Final   Neutro Abs 09/15/2023 4.6  1.7 - 7.7 K/uL Final   Lymphocytes Relative 09/15/2023 36  % Final   Lymphs Abs 09/15/2023 3.2  0.7 - 4.0 K/uL Final   Monocytes Relative 09/15/2023 7  % Final   Monocytes Absolute 09/15/2023 0.7  0.1 - 1.0 K/uL Final   Eosinophils Relative 09/15/2023 4  % Final   Eosinophils Absolute 09/15/2023 0.3  0.0 - 0.5 K/uL Final   Basophils Relative 09/15/2023 1  % Final   Basophils Absolute 09/15/2023 0.1  0.0 - 0.1 K/uL Final   Immature Granulocytes 09/15/2023 0  % Final   Abs Immature Granulocytes 09/15/2023 0.03  0.00 - 0.07 K/uL  Final   Performed at [facility], [address] [place], [place] [address]   Sodium 09/15/2023 138  135 - 145 mmol/L Final   Potassium 09/15/2023 4.0  3.5 - 5.1 mmol/L Final   Chloride 09/15/2023 105  98 - 111 mmol/L Final   CO2 09/15/2023 24  22 - 32 mmol/L Final   Glucose, Bld 09/15/2023 90  70 - 99 mg/dL Final   Glucose reference range applies only to samples taken after fasting for at least 8 hours.   BUN 09/15/2023 6 (L)  8 - 23 mg/dL Final   Creatinine, Ser 09/15/2023 1.17  0.61 - 1.24 mg/dL Final   Calcium  09/15/2023 8.9  8.9 - 10.3 mg/dL Final   Total Protein 88/85/7975 7.1  6.5 - 8.1 g/dL Final   Albumin 88/85/7975 3.5  3.5 - 5.0 g/dL Final   AST 88/85/7975 19  15 - 41 U/L Final   ALT 09/15/2023 15  0 - 44 U/L Final   Alkaline Phosphatase 09/15/2023 70  38 - 126 U/L Final   Total Bilirubin 09/15/2023 0.8   <1.2 mg/dL Final   GFR, Estimated 09/15/2023 >60  >60 mL/min Final   Comment: (NOTE) Calculated using the CKD-EPI Creatinine Equation (2021)    Anion gap 09/15/2023 9  5 - 15 Final   Performed at [facility] Lab, [address], [place], [place] [address]   Hgb A1c MFr Bld 09/15/2023 5.1  4.8 - 5.6 % Final   Comment: (NOTE) Pre diabetes:          5.7%-6.4%  Diabetes:              >6.4%  Glycemic control for   <7.0% adults with diabetes    Mean Plasma Glucose 09/15/2023 99.67  mg/dL Final   Performed at [facility] Lab, [address], [place], [place] [address]   Alcohol, Ethyl (B) 09/15/2023 <10  <10 mg/dL Final   Comment: (NOTE) Lowest detectable limit for serum alcohol is 10 mg/dL.  For medical purposes only. Performed at [facility] Lab, [address], [place], [place] [address]    Cholesterol 09/15/2023 159  0 - 200 mg/dL Final   Triglycerides 88/85/7975 53  <150 mg/dL Final   HDL 88/85/7975 61  >40 mg/dL Final   Total CHOL/HDL Ratio 09/15/2023 2.6  RATIO Final   VLDL 09/15/2023 11  0 - 40 mg/dL Final   LDL Cholesterol 09/15/2023 87  0 - 99 mg/dL Final   Comment:        Total Cholesterol/HDL:CHD Risk Coronary Heart Disease Risk Table                     Men   Women  1/2 Average Risk   3.4   3.3  Average Risk       5.0   4.4  2 X Average Risk   9.6   7.1  3 X Average Risk  23.4   11.0        Use the calculated Patient Ratio above and the CHD Risk Table to determine the patient's CHD Risk.        ATP III CLASSIFICATION (LDL):  <100     mg/dL   Optimal  899-870  mg/dL   Near or Above                    Optimal  130-159  mg/dL   Borderline  839-810  mg/dL  High  >190     mg/dL   Very High Performed at [facility] Lab, [address], [place], [place] [address]    TSH 09/15/2023 3.515  0.350 - 4.500 uIU/mL Final   Comment: Performed by a 3rd Generation assay with a functional sensitivity of <=0.01 uIU/mL. Performed at [facility] Lab, [address] [place], [place] [address]    POC Amphetamine UR 09/15/2023 None Detected  NONE DETECTED (Cut Off Level 1000 ng/mL) Final   POC Secobarbital (BAR) 09/15/2023 None Detected  NONE DETECTED (Cut Off Level 300 ng/mL) Final   POC Buprenorphine (BUP) 09/15/2023 None Detected  NONE DETECTED (Cut Off Level 10 ng/mL) Final   POC Oxazepam (BZO) 09/15/2023 None Detected  NONE DETECTED (Cut Off Level 300 ng/mL) Final   POC Cocaine UR 09/15/2023 Positive (A)  NONE DETECTED (Cut Off Level 300 ng/mL) Final   POC Methamphetamine UR 09/15/2023 None Detected  NONE DETECTED (Cut Off Level 1000 ng/mL) Final   POC Morphine 09/15/2023 None Detected  NONE DETECTED (Cut Off Level 300 ng/mL) Final   POC Methadone UR 09/15/2023 None Detected  NONE DETECTED (Cut Off Level 300 ng/mL) Final   POC Oxycodone  UR 09/15/2023 None Detected  NONE DETECTED (Cut Off Level 100 ng/mL) Final   POC Marijuana UR 09/15/2023 None Detected  NONE DETECTED (Cut Off Level 50 ng/mL) Final  Admission on 08/08/2023, Discharged on 08/22/2023  Component Date Value Ref Range Status   Adenovirus 08/13/2023 NOT DETECTED  NOT DETECTED Final   Coronavirus 229E 08/13/2023 NOT DETECTED  NOT DETECTED Final   Comment: (NOTE) The Coronavirus on the Respiratory Panel, DOES NOT test for the novel  Coronavirus (2019 nCoV)    Coronavirus HKU1 08/13/2023 NOT DETECTED  NOT DETECTED Final   Coronavirus NL63 08/13/2023 NOT DETECTED  NOT DETECTED Final   Coronavirus OC43 08/13/2023 NOT DETECTED  NOT DETECTED Final   Metapneumovirus 08/13/2023 NOT DETECTED  NOT DETECTED Final   Rhinovirus / Enterovirus 08/13/2023 NOT DETECTED  NOT DETECTED Final   Influenza A 08/13/2023 NOT DETECTED  NOT DETECTED Final   Influenza B 08/13/2023 NOT DETECTED  NOT DETECTED Final   Parainfluenza Virus 1 08/13/2023 NOT DETECTED  NOT DETECTED Final   Parainfluenza Virus 2 08/13/2023 NOT DETECTED  NOT DETECTED Final   Parainfluenza Virus 3 08/13/2023 NOT DETECTED  NOT DETECTED  Final   Parainfluenza Virus 4 08/13/2023 NOT DETECTED  NOT DETECTED Final   Respiratory Syncytial Virus 08/13/2023 NOT DETECTED  NOT DETECTED Final   Bordetella pertussis 08/13/2023 NOT DETECTED  NOT DETECTED Final   Bordetella Parapertussis 08/13/2023 NOT DETECTED  NOT DETECTED Final   Chlamydophila pneumoniae 08/13/2023 NOT DETECTED  NOT DETECTED Final   Mycoplasma pneumoniae 08/13/2023 NOT DETECTED  NOT DETECTED Final   Performed at [facility], [address] [place], [place] [address]   SARS Coronavirus 2 by RT PCR 08/14/2023 NEGATIVE  NEGATIVE Final   Comment: (NOTE) SARS-CoV-2 target nucleic acids are NOT DETECTED.  The SARS-CoV-2 RNA is generally detectable in upper and lower respiratory specimens during the acute phase of infection. The lowest concentration of SARS-CoV-2 viral copies this assay can detect is 250 copies / mL. A negative result does not preclude SARS-CoV-2 infection and should not be used as the sole basis for treatment or other patient management decisions.  A negative result may occur with improper specimen collection / handling, submission of specimen other than nasopharyngeal swab, presence of viral mutation(s) within the areas targeted  by this assay, and inadequate number of viral copies (<250 copies / mL). A negative result must be combined with clinical observations, patient history, and epidemiological information.  Fact Sheet for Patients:   roadlaptop.co.za  Fact Sheet for Healthcare Providers: http://kim-miller.com/  This test is not yet approved or                           cleared by the [place]  FDA and has been authorized for detection and/or diagnosis of SARS-CoV-2 by FDA under an Emergency Use Authorization (EUA).  This EUA will remain in effect (meaning this test can be used) for the duration of the COVID-19 declaration under Section 564(b)(1) of the Act, 21 U.S.C. section  360bbb-3(b)(1), unless the authorization is terminated or revoked sooner.  Performed at [facility], [address] [place], [place] [address]   Admission on 08/08/2023, Discharged on 08/08/2023  Component Date Value Ref Range Status   Sodium 08/08/2023 137  135 - 145 mmol/L Final   Potassium 08/08/2023 3.9  3.5 - 5.1 mmol/L Final   Chloride 08/08/2023 107  98 - 111 mmol/L Final   CO2 08/08/2023 20 (L)  22 - 32 mmol/L Final   Glucose, Bld 08/08/2023 148 (H)  70 - 99 mg/dL Final   Glucose reference range applies only to samples taken after fasting for at least 8 hours.   BUN 08/08/2023 9  8 - 23 mg/dL Final   Creatinine, Ser 08/08/2023 1.43 (H)  0.61 - 1.24 mg/dL Final   Calcium  08/08/2023 8.5 (L)  8.9 - 10.3 mg/dL Final   Total Protein 89/92/7975 7.1  6.5 - 8.1 g/dL Final   Albumin 89/92/7975 3.4 (L)  3.5 - 5.0 g/dL Final   AST 89/92/7975 25  15 - 41 U/L Final   ALT 08/08/2023 18  0 - 44 U/L Final   Alkaline Phosphatase 08/08/2023 76  38 - 126 U/L Final   Total Bilirubin 08/08/2023 0.7  0.3 - 1.2 mg/dL Final   GFR, Estimated 08/08/2023 53 (L)  >60 mL/min Final   Comment: (NOTE) Calculated using the CKD-EPI Creatinine Equation (2021)    Anion gap 08/08/2023 10  5 - 15 Final   Performed at [facility], [address], [place], [place] [address]   Alcohol, Ethyl (B) 08/08/2023 <10  <10 mg/dL Final   Comment: (NOTE) Lowest detectable limit for serum alcohol is 10 mg/dL.  For medical purposes only. Performed at [facility], [address], [place], [place] [address]    Salicylate Lvl 08/08/2023 <7.0 (L)  7.0 - 30.0 mg/dL Final   Performed at [facility], [address] [place], [place] [address]   Acetaminophen  (Tylenol ), Serum 08/08/2023 <10 (L)  10 - 30 ug/mL Final   Comment: (NOTE) Therapeutic concentrations vary significantly. A range of 10-30 ug/mL  may be an effective concentration for many patients. However, some  are best treated at  concentrations outside of this range. Acetaminophen  concentrations >150 ug/mL at 4 hours after ingestion  and >50 ug/mL at 12 hours after ingestion are often associated with  toxic reactions.  Performed at [facility], [address] [place], [place] [address]    WBC 08/08/2023 9.9  4.0 - 10.5 K/uL Final   RBC 08/08/2023 4.58  4.22 - 5.81 MIL/uL Final   Hemoglobin 08/08/2023 14.0  13.0 - 17.0 g/dL Final   HCT 89/92/7975 44.0  39.0 - 52.0 %  Final   MCV 08/08/2023 96.1  80.0 - 100.0 fL Final   MCH 08/08/2023 30.6  26.0 - 34.0 pg Final   MCHC 08/08/2023 31.8  30.0 - 36.0 g/dL Final   RDW 89/92/7975 12.6  11.5 - 15.5 % Final   Platelets 08/08/2023 249  150 - 400 K/uL Final   nRBC 08/08/2023 0.0  0.0 - 0.2 % Final   Performed at [facility], [address] [place], [place] [address]   Opiates 08/08/2023 NONE DETECTED  NONE DETECTED Final   Cocaine 08/08/2023 POSITIVE (A)  NONE DETECTED Final   Benzodiazepines 08/08/2023 NONE DETECTED  NONE DETECTED Final   Amphetamines 08/08/2023 NONE DETECTED  NONE DETECTED Final   Tetrahydrocannabinol 08/08/2023 NONE DETECTED  NONE DETECTED Final   Barbiturates 08/08/2023 NONE DETECTED  NONE DETECTED Final   Comment: (NOTE) DRUG SCREEN FOR MEDICAL PURPOSES ONLY.  IF CONFIRMATION IS NEEDED FOR ANY PURPOSE, NOTIFY LAB WITHIN 5 DAYS.  LOWEST DETECTABLE LIMITS FOR URINE DRUG SCREEN Drug Class                     Cutoff (ng/mL) Amphetamine and metabolites    1000 Barbiturate and metabolites    200 Benzodiazepine                 200 Opiates and metabolites        300 Cocaine and metabolites        300 THC                            50 Performed at [facility], [address] [place], [place] [address]    SARS Coronavirus 2 by RT PCR 08/08/2023 NEGATIVE  NEGATIVE Final   Performed at [facility], [address], [place], [place] [address]  Admission on 07/15/2023, Discharged on 07/17/2023  Component Date Value Ref  Range Status   Sodium 07/15/2023 135  135 - 145 mmol/L Final   Potassium 07/15/2023 3.7  3.5 - 5.1 mmol/L Final   Chloride 07/15/2023 107  98 - 111 mmol/L Final   CO2 07/15/2023 19 (L)  22 - 32 mmol/L Final   Glucose, Bld 07/15/2023 129 (H)  70 - 99 mg/dL Final   Glucose reference range applies only to samples taken after fasting for at least 8 hours.   BUN 07/15/2023 11  8 - 23 mg/dL Final   Creatinine, Ser 07/15/2023 1.39 (H)  0.61 - 1.24 mg/dL Final   Calcium  07/15/2023 8.4 (L)  8.9 - 10.3 mg/dL Final   Total Protein 90/86/7975 6.9  6.5 - 8.1 g/dL Final   Albumin 90/86/7975 3.6  3.5 - 5.0 g/dL Final   AST 90/86/7975 21  15 - 41 U/L Final   ALT 07/15/2023 17  0 - 44 U/L Final   Alkaline Phosphatase 07/15/2023 64  38 - 126 U/L Final   Total Bilirubin 07/15/2023 0.5  0.3 - 1.2 mg/dL Final   GFR, Estimated 07/15/2023 55 (L)  >60 mL/min Final   Comment: (NOTE) Calculated using the CKD-EPI Creatinine Equation (2021)    Anion gap 07/15/2023 9  5 - 15 Final   Performed at [facility], [address] [place], [place] [address]   Alcohol, Ethyl (B) 07/15/2023 <10  <10 mg/dL Final   Comment: (NOTE) Lowest detectable limit for serum alcohol is 10 mg/dL.  For medical purposes only. Performed at [facility], [address] [place], [place] [address]    Salicylate Lvl 07/15/2023 <7.0 (L)  7.0 - 30.0 mg/dL Final   Performed at [facility], [address] [place], [place] [address]   Acetaminophen  (Tylenol ), Serum 07/15/2023 <10 (L)  10 - 30 ug/mL Final   Comment: (NOTE) Therapeutic concentrations vary significantly. A range of 10-30 ug/mL  may be an effective concentration for many patients. However, some  are best treated at concentrations outside of this range. Acetaminophen  concentrations >150 ug/mL at 4 hours after ingestion  and >50 ug/mL at 12 hours after ingestion are often associated with  toxic reactions.  Performed at [facility], [address], [place], [place] [address]    WBC 07/15/2023 9.1  4.0 - 10.5 K/uL Final   RBC 07/15/2023 4.20 (L)  4.22 - 5.81 MIL/uL Final   Hemoglobin 07/15/2023 13.0  13.0 - 17.0 g/dL Final   HCT 90/86/7975 40.5  39.0 - 52.0 % Final   MCV 07/15/2023 96.4  80.0 - 100.0 fL Final   MCH 07/15/2023 31.0  26.0 - 34.0 pg Final   MCHC 07/15/2023 32.1  30.0 - 36.0 g/dL Final   RDW 90/86/7975 12.6  11.5 - 15.5 % Final   Platelets 07/15/2023 267  150 - 400 K/uL Final   nRBC 07/15/2023 0.0  0.0 - 0.2 % Final   Performed at [facility], [address] [place], [place] [address]   Opiates 07/15/2023 NONE DETECTED  NONE DETECTED Final   Cocaine 07/15/2023 POSITIVE (A)  NONE DETECTED Final   Benzodiazepines 07/15/2023 NONE DETECTED  NONE DETECTED Final   Amphetamines 07/15/2023 NONE DETECTED  NONE DETECTED Final   Tetrahydrocannabinol 07/15/2023 POSITIVE (A)  NONE DETECTED Final   Barbiturates 07/15/2023 NONE DETECTED  NONE DETECTED Final   Comment: (NOTE) DRUG SCREEN FOR MEDICAL PURPOSES ONLY.  IF CONFIRMATION IS NEEDED FOR ANY PURPOSE, NOTIFY LAB WITHIN 5 DAYS.  LOWEST DETECTABLE LIMITS FOR URINE DRUG SCREEN Drug Class                     Cutoff (ng/mL) Amphetamine and metabolites    1000 Barbiturate and metabolites    200 Benzodiazepine                 200 Opiates and metabolites        300 Cocaine and metabolites        300 THC                            50 Performed at [facility], [address], [place], [place] [address]    Color, Urine 07/15/2023 YELLOW  YELLOW Final   APPearance 07/15/2023 CLEAR  CLEAR Final   Specific Gravity, Urine 07/15/2023 >1.030 (H)  1.005 - 1.030 Final   pH 07/15/2023 6.0  5.0 - 8.0 Final   Glucose, UA 07/15/2023 NEGATIVE  NEGATIVE mg/dL Final   Hgb urine dipstick 07/15/2023 NEGATIVE  NEGATIVE Final   Bilirubin Urine 07/15/2023 NEGATIVE  NEGATIVE Final   Ketones, ur 07/15/2023 NEGATIVE  NEGATIVE mg/dL Final   Protein, ur 90/86/7975 NEGATIVE  NEGATIVE  mg/dL Final   Nitrite 90/86/7975 NEGATIVE  NEGATIVE Final   Leukocytes,Ua 07/15/2023 NEGATIVE  NEGATIVE Final   Comment: Microscopic not done on urines with negative protein, blood, leukocytes, nitrite, or glucose < 500 mg/dL. Performed at [facility], [address] [place],  [place] [address]    SARS Coronavirus 2 by RT PCR 07/16/2023 NEGATIVE  NEGATIVE Final   Performed at [facility], [address], [place], [place] [address]  Admission on 07/12/2023, Discharged on 07/12/2023  Component Date Value Ref Range Status   Troponin I (High Sensitivity) 07/12/2023 7  <18 ng/L Final   Comment: (NOTE) Elevated high sensitivity troponin I (hsTnI) values and significant  changes across serial measurements may suggest ACS but many other  chronic and acute conditions are known to elevate hsTnI results.  Refer to the Links section for chest pain algorithms and additional  guidance. Performed at [facility], [address] [place], [place] [address]    Sodium 07/12/2023 136  135 - 145 mmol/L Final   Potassium 07/12/2023 4.1  3.5 - 5.1 mmol/L Final   Chloride 07/12/2023 106  98 - 111 mmol/L Final   CO2 07/12/2023 19 (L)  22 - 32 mmol/L Final   Glucose, Bld 07/12/2023 117 (H)  70 - 99 mg/dL Final   Glucose reference range applies only to samples taken after fasting for at least 8 hours.   BUN 07/12/2023 10  8 - 23 mg/dL Final   Creatinine, Ser 07/12/2023 1.17  0.61 - 1.24 mg/dL Final   Calcium  07/12/2023 8.2 (L)  8.9 - 10.3 mg/dL Final   Total Protein 90/89/7975 6.6  6.5 - 8.1 g/dL Final   Albumin 90/89/7975 3.3 (L)  3.5 - 5.0 g/dL Final   AST 90/89/7975 22  15 - 41 U/L Final   ALT 07/12/2023 16  0 - 44 U/L Final   Alkaline Phosphatase 07/12/2023 62  38 - 126 U/L Final   Total Bilirubin 07/12/2023 0.6  0.3 - 1.2 mg/dL Final   GFR, Estimated 07/12/2023 >60  >60 mL/min Final   Comment: (NOTE) Calculated using the CKD-EPI Creatinine Equation (2021)    Anion gap  07/12/2023 11  5 - 15 Final   Performed at [facility], [address], [place], [place] [address]   Alcohol, Ethyl (B) 07/12/2023 <10  <10 mg/dL Final   Comment: (NOTE) Lowest detectable limit for serum alcohol is 10 mg/dL.  For medical purposes only. Performed at [facility], [address], [place], [place] [address]    Salicylate Lvl 07/12/2023 <7.0 (L)  7.0 - 30.0 mg/dL Final   Performed at [facility], [address], [place], [place] [address]   Acetaminophen  (Tylenol ), Serum 07/12/2023 <10 (L)  10 - 30 ug/mL Final   Comment: (NOTE) Therapeutic concentrations vary significantly. A range of 10-30 ug/mL  may be an effective concentration for many patients. However, some  are best treated at concentrations outside of this range. Acetaminophen  concentrations >150 ug/mL at 4 hours after ingestion  and >50 ug/mL at 12 hours after ingestion are often associated with  toxic reactions.  Performed at [facility], [address] [place], [place] [address]    WBC 07/12/2023 7.9  4.0 - 10.5 K/uL Final   RBC 07/12/2023 4.43  4.22 - 5.81 MIL/uL Final   Hemoglobin 07/12/2023 13.3  13.0 - 17.0 g/dL Final   HCT 90/89/7975 41.7  39.0 - 52.0 % Final   MCV 07/12/2023 94.1  80.0 - 100.0 fL Final   MCH 07/12/2023 30.0  26.0 - 34.0 pg Final   MCHC 07/12/2023 31.9  30.0 - 36.0 g/dL Final   RDW 90/89/7975 12.5  11.5 - 15.5 % Final   Platelets 07/12/2023  255  150 - 400 K/uL Final   nRBC 07/12/2023 0.0  0.0 - 0.2 % Final   Performed at [facility], [address], [place], [place] [address]   Opiates 07/12/2023 NONE DETECTED  NONE DETECTED Final   Cocaine 07/12/2023 POSITIVE (A)  NONE DETECTED Final   Benzodiazepines 07/12/2023 NONE DETECTED  NONE DETECTED Final   Amphetamines 07/12/2023 NONE DETECTED  NONE DETECTED Final   Tetrahydrocannabinol 07/12/2023 POSITIVE (A)  NONE DETECTED Final   Barbiturates 07/12/2023 NONE DETECTED  NONE DETECTED Final   Comment:  (NOTE) DRUG SCREEN FOR MEDICAL PURPOSES ONLY.  IF CONFIRMATION IS NEEDED FOR ANY PURPOSE, NOTIFY LAB WITHIN 5 DAYS.  LOWEST DETECTABLE LIMITS FOR URINE DRUG SCREEN Drug Class                     Cutoff (ng/mL) Amphetamine and metabolites    1000 Barbiturate and metabolites    200 Benzodiazepine                 200 Opiates and metabolites        300 Cocaine and metabolites        300 THC                            50 Performed at [facility], [address] [place], [place] [address]    Troponin I (High Sensitivity) 07/12/2023 7  <18 ng/L Final   Comment: (NOTE) Elevated high sensitivity troponin I (hsTnI) values and significant  changes across serial measurements may suggest ACS but many other  chronic and acute conditions are known to elevate hsTnI results.  Refer to the Links section for chest pain algorithms and additional  guidance. Performed at [facility], [address], [place], [place] [address]   Admission on 06/01/2023, Discharged on 06/01/2023  Component Date Value Ref Range Status   Fecal Occult Bld 06/01/2023 POSITIVE (A)  NEGATIVE Final   Sodium 06/01/2023 135  135 - 145 mmol/L Final   Potassium 06/01/2023 4.0  3.5 - 5.1 mmol/L Final   Chloride 06/01/2023 102  98 - 111 mmol/L Final   CO2 06/01/2023 22  22 - 32 mmol/L Final   Glucose, Bld 06/01/2023 100 (H)  70 - 99 mg/dL Final   Glucose reference range applies only to samples taken after fasting for at least 8 hours.   BUN 06/01/2023 13  8 - 23 mg/dL Final   Creatinine, Ser 06/01/2023 1.35 (H)  0.61 - 1.24 mg/dL Final   Calcium  06/01/2023 8.6 (L)  8.9 - 10.3 mg/dL Final   Total Protein 92/68/7975 6.8  6.5 - 8.1 g/dL Final   Albumin 92/68/7975 3.5  3.5 - 5.0 g/dL Final   AST 92/68/7975 26  15 - 41 U/L Final   ALT 06/01/2023 18  0 - 44 U/L Final   Alkaline Phosphatase 06/01/2023 65  38 - 126 U/L Final   Total Bilirubin 06/01/2023 0.8  0.3 - 1.2 mg/dL Final   GFR, Estimated 06/01/2023 57 (L)   >60 mL/min Final   Comment: (NOTE) Calculated using the CKD-EPI Creatinine Equation (2021)    Anion gap 06/01/2023 11  5 - 15 Final   Performed at [facility], [address] [place], [place] [address]   WBC 06/01/2023 11.7 (H)  4.0 - 10.5 K/uL Final   RBC 06/01/2023 3.94 (L)  4.22 - 5.81 MIL/uL Final  Hemoglobin 06/01/2023 12.2 (L)  13.0 - 17.0 g/dL Final   HCT 92/68/7975 37.8 (L)  39.0 - 52.0 % Final   MCV 06/01/2023 95.9  80.0 - 100.0 fL Final   MCH 06/01/2023 31.0  26.0 - 34.0 pg Final   MCHC 06/01/2023 32.3  30.0 - 36.0 g/dL Final   RDW 92/68/7975 12.4  11.5 - 15.5 % Final   Platelets 06/01/2023 238  150 - 400 K/uL Final   nRBC 06/01/2023 0.0  0.0 - 0.2 % Final   Neutrophils Relative % 06/01/2023 75  % Final   Neutro Abs 06/01/2023 8.7 (H)  1.7 - 7.7 K/uL Final   Lymphocytes Relative 06/01/2023 16  % Final   Lymphs Abs 06/01/2023 1.9  0.7 - 4.0 K/uL Final   Monocytes Relative 06/01/2023 7  % Final   Monocytes Absolute 06/01/2023 0.9  0.1 - 1.0 K/uL Final   Eosinophils Relative 06/01/2023 0  % Final   Eosinophils Absolute 06/01/2023 0.0  0.0 - 0.5 K/uL Final   Basophils Relative 06/01/2023 1  % Final   Basophils Absolute 06/01/2023 0.1  0.0 - 0.1 K/uL Final   Immature Granulocytes 06/01/2023 1  % Final   Abs Immature Granulocytes 06/01/2023 0.10 (H)  0.00 - 0.07 K/uL Final   Performed at [facility], [address], [place], [place] [address]   Prothrombin Time 06/01/2023 13.7  11.4 - 15.2 seconds Final   INR 06/01/2023 1.0  0.8 - 1.2 Final   Comment: (NOTE) INR goal varies based on device and disease states. Performed at [facility], [address] [place], [place] [address]    ABO/RH(D) 06/01/2023 O POS   Final   Antibody Screen 06/01/2023 NEG   Final   Sample Expiration 06/01/2023    Final                   Value:06/04/2023,2359 Performed at [facility], [address] [place], [place] [address]    ABO/RH(D) 06/01/2023    Final                    Value:O POS Performed at [facility], [address] [place], [place] [address]   There may be more visits with results that are not included.    Blood Alcohol level:  Lab Results  Component Value Date   ETH <10 10/10/2023   ETH <10 09/15/2023    Metabolic Disorder Labs: Lab Results  Component Value Date   HGBA1C 5.1 09/15/2023   MPG 99.67 09/15/2023   MPG 103 01/13/2023   No results found for: PROLACTIN Lab Results  Component Value Date   CHOL 159 09/15/2023   TRIG 53 09/15/2023   HDL 61 09/15/2023   CHOLHDL 2.6 09/15/2023   VLDL 11 09/15/2023   LDLCALC 87 09/15/2023   LDLCALC 99 01/13/2023    Therapeutic Lab Levels: No results found for: LITHIUM No results found for: VALPROATE No results found for: CBMZ  Physical Findings   AIMS    Flowsheet Row [facility]) [facility] Ad[facility] (Discharged) [facility]  AIMS Total Score 0 0      AUDIT    Flowsheet Row Admission (Discharged) from 10/11/2023 in [facility] Admission (Discharged) from 08/08/2023 in [facility] BE[facility]IORAL MEDICINE Admission (Discharged) from 04/06/2023 in [facility] BEHAVIORAL MEDICINE Admission (Discharged) from 01/10/2023 in [facility]  BEHAVIORAL MEDICINE Admission (Discharged) from 08/11/2022 in [facility] BEHAVIORAL MEDICINE  Alcohol Use Disorder Identification Test Final Score (AUDIT) 1 29 0 13 0      CAGE-AID    Flowsheet Row ED to Hosp-Admission (Discharged) from 06/04/2020 in [facility]  CAGE-AID Score 3      GAD-7    Flowsheet Row Counselor from 09/21/2023 in [facility] Office Visit from 01/11/2022 in [facility] [place] - A Dept Of [place]. [facility] Office Visit from 09/28/2021 in [facility] [place] - A Dept Of [place] DEL.  [facility] Office Visit from 02/26/2021 in [facility] Office Visit from 12/25/2020 in [facility]IC 1  Total GAD-7 Score 7 7 10 11 7       PHQ2-9    Flowsheet Row Counselor from 09/21/2023 in [facility] ED from 09/16/2023 in [facility] Clinical Support from 10/19/2022 in [facility] [place] - A Dept Of [place]. [facility] Office Visit from 05/11/2022 in [facility] [place] - A Dept Of [place] DEL. [facility] Patient Outreach Telephone from 03/31/2022 in [facility] Community Care Coordination  PHQ-2 Total Score 1 0 0 2 0  PHQ-9 Total Score -- -- -- 5 --      Flowsheet Row ED from 11/07/2023 in [facility] Most recent reading at 11/07/2023  5:29 PM ED from 11/07/2023 in [facility] Most recent reading at 11/07/2023  9:35 AM Admission (Discharged) from 10/11/2023 in [facility] BEHAVIORAL MEDICINE Most recent reading at 10/11/2023  5:00 PM  C-SSRS RISK CATEGORY No Risk High Risk No Risk        Musculoskeletal  Strength & Muscle Tone: within normal limits Gait & Station: normal Patient leans: N/A  Psychiatric Specialty Exam  Presentation  General Appearance:  Appropriate for Environment  Eye Contact: -- (Staring at the wall when talking, minimal eye contact with the interviewer)  Speech: Normal Rate  Speech Volume: Normal  Handedness: Not assessed   Mood and Affect  Mood: Depressed  Affect: Congruent; Depressed   Thought Process  Thought Processes: Coherent  Descriptions of Associations:Intact  Orientation:Full (Time, Place and Person)  Thought Content:Logical  Diagnosis of Schizophrenia or Schizoaffective disorder in past: No    Hallucinations:No data recorded Ideas of Reference:None  Suicidal Thoughts:Denies Homicidal Thoughts:Denies  Sensorium   Memory: Immediate Good; Recent Good; Remote Good  Judgment: Poor  Insight: Fair   Chartered Certified Accountant: Fair  Attention Span: Fair  Recall: [employer] of Knowledge: Fair  Language: Fair   Psychomotor Activity  Psychomotor Activity:Normal  Assets  Assets: Social Support; Desire for Improvement   Sleep  Sleep:No data recorded    Physical Exam  Physical Exam Vitals and nursing note reviewed.  Constitutional:      General: He is not in acute distress.    Appearance: He is not ill-appearing.  HENT:     Head: Normocephalic and atraumatic.  Eyes:     Extraocular Movements: Extraocular movements intact.     Conjunctiva/sclera: Conjunctivae normal.  Pulmonary:     Effort: Pulmonary effort is normal. No respiratory distress.  Skin:    General: Skin is warm and dry.  Neurological:     General: No focal deficit present.    Review of Systems  All other systems reviewed and are negative.  Blood pressure (!) 156/88, pulse 63, temperature 97.9 F (36.6 C), temperature source Oral, resp. rate 20, SpO2 99%. There is no height or weight on file to calculate BMI.  Treatment Plan Summary: Daily contact with patient to assess and evaluate symptoms and progress in treatment and Medication management   #Alcohol Use Disorder No ethanol level was obtained on admission, will withhold from starting a Librium taper, subjectively denies any cravings, objectively does not appear to be in acute distress or actively withdrawing.  Vital signs have remained stable -START CIWA with Ativan  as needed for CIWA greater than 10 -Thiamine  100 mg IM first day and PO after that -Multivitamin with minerals daily -Tylenol  650 mg every 6 hours as needed for pain -Imodium  2 to 4 mg as needed for diarrhea or loose stools  -Maalox/Mylanta 30 mL every 4 hours as needed for indigestion -Milk of Mag 30 mL as needed for constipation  #MDD by hx -Restarted home Abilify  15 mg,  per chart review this appears to be an augmenting agent for Remeron  -Restart home Remeron  30 mg nightly for depression  #HLD Lipitor  80 mg   #GERD -Protonix   #COPD -Dulera  inhaler   Dispo: Patient has been accepted to [facility] treatment center for Wednesday, January 8th    Signed Marlo Masson, MD 11/08/2023 12:07 PM

## 2023-11-08 NOTE — Group Note (Signed)
 Group Topic: Change and Accountability  Group Date: 11/08/2023 Start Time: 1200 End Time: 1230 Facilitators: Gerlean Leeroy HERO, RN  Department: [facility]  Number of Participants: 3  Group Focus: activities of daily living skills Treatment Modality:  Behavior Modification Therapy Interventions utilized were clarification Purpose: improve communication skills  Name: Robert Chan Date of Birth: Mar 13, 1956  MR: [id number]    Level of Participation: minimal Quality of Participation: cooperative Interactions with others: limited  Mood/Affect: flat Triggers (if applicable): N/A Cognition: coherent/clear Progress: Moderate Response: engaged Plan: patient will be encouraged to utilize resources in the community to help him gain more stabilty  Patients Problems:  Patient Active Problem List   Diagnosis Date Noted   Cocaine abuse (HCC) 10/12/2023   MDD (major depressive disorder), recurrent episode, severe (HCC) 10/11/2023   AKI (acute kidney injury) (HCC) 10/10/2023   Alcohol use with alcohol-induced mood disorder (HCC) 09/16/2023   MDD (major depressive disorder), recurrent severe, without psychosis (HCC) 08/08/2023   MDD (major depressive disorder), recurrent episode, mild (HCC) 07/12/2023   Cocaine use 05/25/2023   PAD (peripheral artery disease) (HCC) 12/01/2021   Atopic dermatitis 04/22/2021   Former tobacco use    Polysubstance abuse (HCC) 09/06/2020   Vitamin D  deficiency 06/23/2020   Coronary artery disease 06/19/2020   History of non-ST elevation myocardial infarction (NSTEMI)    Foot callus 12/12/2019   Age-related nuclear cataract of right eye 09/13/2016   Benign hypertension 07/06/2016   Glaucoma 07/06/2016   Male erectile disorder 07/06/2016   Asthma 10/18/2012   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

## 2023-11-09 DIAGNOSIS — F331 Major depressive disorder, recurrent, moderate: Secondary | ICD-10-CM | POA: Diagnosis not present

## 2023-11-09 DIAGNOSIS — F1420 Cocaine dependence, uncomplicated: Secondary | ICD-10-CM

## 2023-11-09 MED ORDER — MOMETASONE FURO-FORMOTEROL FUM 200-5 MCG/ACT IN AERO: 2.0000 | INHALATION_SPRAY | Freq: Two times a day (BID) | RESPIRATORY_TRACT | Status: DC

## 2023-11-09 MED ORDER — ACETAMINOPHEN 325 MG PO TABS: 650.0000 mg | ORAL_TABLET | Freq: Four times a day (QID) | ORAL | Status: DC | PRN

## 2023-11-09 MED ORDER — SENNOSIDES-DOCUSATE SODIUM 8.6-50 MG PO TABS: 1.0000 | ORAL_TABLET | Freq: Every evening | ORAL | 0 refills | Status: DC | PRN

## 2023-11-09 MED ORDER — ADULT MULTIVITAMIN W/MINERALS CH: 1.0000 | ORAL_TABLET | Freq: Every day | ORAL | Status: DC

## 2023-11-09 MED ORDER — ARIPIPRAZOLE 15 MG PO TABS: 15.0000 mg | ORAL_TABLET | Freq: Every day | ORAL | 0 refills | Status: DC

## 2023-11-09 MED ORDER — ATORVASTATIN CALCIUM 80 MG PO TABS: 80.0000 mg | ORAL_TABLET | Freq: Every day | ORAL | 0 refills | Status: DC

## 2023-11-09 MED ORDER — LATANOPROST 0.005 % OP SOLN: 1.0000 [drp] | Freq: Every day | OPHTHALMIC | Status: DC

## 2023-11-09 MED ORDER — PANTOPRAZOLE SODIUM 20 MG PO TBEC: 20.0000 mg | DELAYED_RELEASE_TABLET | Freq: Every day | ORAL | 0 refills | Status: DC

## 2023-11-09 MED ORDER — LATANOPROST 0.005 % OP SOLN: 1.0000 [drp] | Freq: Every day | OPHTHALMIC | 0 refills | Status: DC

## 2023-11-09 MED ORDER — LORATADINE 10 MG PO TABS: 10.0000 mg | ORAL_TABLET | Freq: Once | ORAL | Status: DC

## 2023-11-09 MED ORDER — MOMETASONE FURO-FORMOTEROL FUM 200-5 MCG/ACT IN AERO: 2.0000 | INHALATION_SPRAY | Freq: Two times a day (BID) | RESPIRATORY_TRACT | 0 refills | Status: DC

## 2023-11-09 MED ORDER — DILTIAZEM HCL ER COATED BEADS 180 MG PO CP24: 180.0000 mg | ORAL_CAPSULE | Freq: Every day | ORAL | 0 refills | Status: DC

## 2023-11-09 MED ORDER — LORATADINE 10 MG PO TABS
10.0000 mg | ORAL_TABLET | Freq: Once | ORAL | Status: AC
  Administered 2023-11-09: 10 mg via ORAL
  Filled 2023-11-09: qty 1

## 2023-11-09 MED ORDER — MIRTAZAPINE 30 MG PO TABS: 30.0000 mg | ORAL_TABLET | Freq: Every day | ORAL | 0 refills | Status: DC

## 2023-11-09 MED ORDER — ALBUTEROL SULFATE HFA 108 (90 BASE) MCG/ACT IN AERS: 2.0000 | INHALATION_SPRAY | Freq: Four times a day (QID) | RESPIRATORY_TRACT | 0 refills | Status: DC | PRN

## 2023-11-09 MED ORDER — ALBUTEROL SULFATE HFA 108 (90 BASE) MCG/ACT IN AERS: 2.0000 | INHALATION_SPRAY | Freq: Four times a day (QID) | RESPIRATORY_TRACT | Status: DC | PRN

## 2023-11-09 NOTE — ED Notes (Signed)
 Patient discharged to [facility] per MD order. After Visit Summary (AVS) printed and given to patient, as well as printed prescriptions. Patient home medications returned to patient by clinical research associate. AVS reviewed with patient and all questions fully answered. Patient discharged in no acute distress, A&O x4 and ambulatory. Patient denied SI/HI, A/VH upon discharge. Patient verbalized understanding of all discharge instructions explained by staff, including follow up appointments, RX's and safety plan. Patient mood fair, patient thanked staff for the care provided during his stay. Patient belongings returned to patient from locker #29 complete and intact. Patient escorted to lobby via staff for transport to destination. Safety maintained.

## 2023-11-09 NOTE — ED Notes (Signed)
 PRN Tylenol  administered with scheduled medications. Patient coughed briefly after administration stating they got a little choked up on the medication. Additional water given, and clinical research associate inquired with patient if they felt all medications went down properly, which patient confirms. Patient states they're okay. Patient in no visible acute distress, respirations even and unlabored. Environment secured, safety checks in place per facility policy.

## 2023-11-09 NOTE — Discharge Instructions (Addendum)
 Dear Robert Chan,  It was a pleasure to take care of you during your stay at [facility] where you were treated for your substance use disorders.   While you were here, you were:  observed and cared for by our nurses and nursing assistants  treated with medications by your psychiatrists  provided individual and group therapy by therapists  provided resources by our social workers and case managers  Please review the medication list provided to you at discharge and stop, start taking, or continue taking the medications listed there.  I recommend abstinence from alcohol, tobacco, and other illicit drug use.   If your psychiatric symptoms or suicidal thoughts recur, worsen, or if you have side effects to your psychiatric medications, call your outpatient psychiatric provider, 911, [phone] or go to the nearest emergency department.  Take care!  Signed: Marlo Masson, MD 11/09/2023, 8:47 AM  Naloxone (Narcan) can help reverse an overdose when given to the victim quickly.  [place] offers free naloxone kits and instructions/training on its use.  Add naloxone to your first aid kit and you can help save a life. A prescription can be filled at your local pharmacy or free kits are provided by the county

## 2023-11-09 NOTE — ED Notes (Signed)
 Patient resting with eyes closed in no apparent acute distress. Respirations even and unlabored. Environment secured. Safety checks in place according to facility policy.

## 2023-11-09 NOTE — ED Notes (Signed)
Patient is sleeping. Respirations equal and unlabored, skin warm and dry, NAD. No change in assessment or acuity. Routine safety checks conducted according to facility protocol. Will continue to monitor for safety.   

## 2023-11-09 NOTE — ED Notes (Signed)
 Patient alert & oriented x4. Denies intent to harm self or others when asked. Denies A/VH. Patient denies any physical complaints when asked. No acute distress noted. Patient confirms knowledge of planned d/c today around 1130 to [facility], no questions regarding d/c at this time. Support and encouragement provided. Routine safety checks conducted per facility protocol. Encouraged patient to notify staff if any thoughts of harm towards self or others arise. Patient verbalizes understanding and agreement.

## 2023-11-09 NOTE — ED Notes (Signed)
 Patient sitting in dayroom, quietly watching television. No acute distress noted. No concerns voiced. Informed patient to notify staff with any needs or assistance. Patient verbalized understanding or agreement. Safety checks in place per facility policy.

## 2023-11-09 NOTE — ED Provider Notes (Addendum)
 FBC/OBS ASAP Discharge Summary  Date and Time: 11/09/2023 9:57 AM  Name: Robert Chan  MRN:  [id number]   Discharge Diagnoses:  Final diagnoses:  Cocaine use disorder, moderate, dependence (HCC)  Alcohol use disorder    Subjective:  Patient was evaluated on day of discharge.  He reports continued adequate sleep and appetite.  He feels ready to go, admits he is experiencing some anxiety related to the facility and the fact that this will be a new environment for him.  Support and encouragement was provided.  He denies any suicidal ideations, homicidal ideations, or hallucinations.  He denies any paranoid ideations.  He denies delusional thought processes.  On day of discharge the patient has no concerns regarding his scheduled medications.  Denies any side effects to his psychotropic medications.  He reports some allergies this morning with sinus pressure and rhinorrhea.  Claritin  was provided.  Denies fever chills or malaise.  Stay Summary:  Robert Chan is a 68 yo male with PPHx MDD, alcohol use disorder, and cocaine use disorder [facility] assistance with substance use issues. Past medical history of atherosclerotic CAD,GERD, HLD COPD, hypertension, history of NSTEMI.  His UDS on admission was positive for cocaine.  During the patient's hospitalization at the [facility], patient had extensive initial psychiatric evaluation, with daily follow-up assessments focused on detoxification management.  Psychiatric diagnoses provided upon initial assessment:  Cocaine use disorder Alcohol use disorder  Patient's medications adjusted during hospitalization:  Patient's home psychotropic medications were restarted which include  Remeron  30 mg nightly for MDD Abilify  50 mg daily as augmentative agent   Patient's care was discussed during the interdisciplinary team meeting every day during the hospitalization.  The patient denies having side effects to  prescribed psychiatric medication.  Gradually, patient started adjusting to milieu. The patient was evaluated each day by a clinical provider to ascertain response to treatment. Improvement was noted by the patient's report of decreasing symptoms, improved sleep and appetite, affect, medication tolerance, behavior, and participation in unit programming.  Patient was asked each day to complete a self inventory noting mood, mental status, pain, new symptoms, anxiety and concerns.    Symptoms were reported as significantly decreased or resolved completely by discharge.   On day of discharge, the patient reports that their mood is stable. The patient denied having suicidal thoughts for more than 48 hours prior to discharge.  Patient denies having homicidal thoughts.  Patient denies having auditory hallucinations.  Patient denies any visual hallucinations or other symptoms of psychosis. The patient was motivated to continue taking medication with a goal of continued improvement in mental health.   The patient reported that their withdrawal symptoms and cravings responded well to the detox regimen, with overall benefit from the detox program. Supportive psychotherapy was provided, and the patient participated in regular group therapy sessions focused on managing cravings and withdrawal. Coping skills, problem-solving, and relaxation techniques were also part of the program's therapeutic interventions.  Labs were reviewed with the patient, and abnormal results were discussed with the patient.  The patient is able to verbalize their individual safety plan to this provider.  # It is recommended to the patient to continue psychiatric medications as prescribed, after discharge from the hospital.    # It is recommended to the patient to follow up with your outpatient psychiatric provider and PCP.  # It was discussed with the patient, the impact of alcohol, drugs, tobacco have been there overall psychiatric and  medical  wellbeing, and total abstinence from substance use was recommended the patient.ed.  # Prescriptions provided or sent directly to preferred pharmacy at discharge. Patient agreeable to plan. Given opportunity to ask questions. Appears to feel comfortable with discharge.    # In the event of worsening symptoms, the patient is instructed to call the crisis hotline, 911 and or go to the nearest ED for appropriate evaluation and treatment of symptoms. To follow-up with primary care provider for other medical issues, concerns and or health care needs  # Patient was discharged to [facility] with a plan to follow up as noted below.     Total Time spent with patient: 30 minutes Past Psychiatric History: MDD, Cocaine Use disorder, Alcohol use disorder, severe. Multiple inpatient psychiatric hospitalizations, last admission 10/11/2023    Past Medical History:  PCP: Brien Belvie BRAVO, MD  Medical diagnoses: Atherosclerotic CAD, COPD, hypertension, NSTEMI in 2021, HLD, GERD Current medications Atorvastatin  80 mg daily Diltiazem  180 mg daily Protonix  20 mg daily Dulera  inhaler Albuterol  inhaler Allergies: Shellfish  Family History:  Patient reports history brothers committed suicide by overdose on drugs  Social History: Patient reports he is on Social Security disability income.  He reports he gets around $1000/month.  He lives alone in an apartment  Current Medications:  Current Facility-Administered Medications  Medication Dose Route Frequency Provider Last Rate Last Admin   acetaminophen  (TYLENOL ) tablet 650 mg  650 mg Oral Q6H PRN Wilkie Majel RAMAN, FNP   650 mg at 11/09/23 0910   albuterol  (VENTOLIN  HFA) 108 (90 Base) MCG/ACT inhaler 2 puff  2 puff Inhalation Q6H PRN Carrion-Carrero, Sri Clegg, MD       alum & mag hydroxide-simeth (MAALOX/MYLANTA) 200-200-20 MG/5ML suspension 30 mL  30 mL Oral Q4H PRN Starkes-Perry, Takia S, FNP       ARIPiprazole  (ABILIFY ) tablet  15 mg  15 mg Oral QPC breakfast Carrion-Carrero, Yashica Sterbenz, MD   15 mg at 11/09/23 0841   atorvastatin  (LIPITOR ) tablet 80 mg  80 mg Oral Daily Carrion-Carrero, Marlo, MD   80 mg at 11/09/23 0910   diltiazem  (CARDIZEM  CD) 24 hr capsule 180 mg  180 mg Oral Daily Carrion-Carrero, Marlo, MD   180 mg at 11/09/23 0910   hydrOXYzine  (ATARAX ) tablet 25 mg  25 mg Oral TID PRN Wilkie Majel RAMAN, FNP       latanoprost  (XALATAN ) 0.005 % ophthalmic solution 1 drop  1 drop Both Eyes QHS Carrion-Carrero, Kandiss Ihrig, MD   1 drop at 11/08/23 2129   loperamide  (IMODIUM ) capsule 2-4 mg  2-4 mg Oral PRN Carrion-Carrero, Marlo, MD       loratadine  (CLARITIN ) tablet 10 mg  10 mg Oral Once Carrion-Carrero, Marlo, MD       LORazepam  (ATIVAN ) tablet 1 mg  1 mg Oral Q6H PRN Carrion-Carrero, Gillie Fleites, MD       magnesium  hydroxide (MILK OF MAGNESIA) suspension 30 mL  30 mL Oral Daily PRN Starkes-Perry, Takia S, FNP       melatonin tablet 5 mg  5 mg Oral QHS Carrion-Carrero, Korin Setzler, MD   5 mg at 11/08/23 2125   mirtazapine  (REMERON ) tablet 30 mg  30 mg Oral QHS Carrion-Carrero, Arlene Brickel, MD   30 mg at 11/08/23 2125   mometasone -formoterol  (DULERA ) 200-5 MCG/ACT inhaler 2 puff  2 puff Inhalation BID Homer Marlo, MD   2 puff at 11/09/23 0912   multivitamin with minerals tablet 1 tablet  1 tablet Oral Daily Homer Marlo, MD   1 tablet at 11/09/23  9089   pantoprazole  (PROTONIX ) EC tablet 20 mg  20 mg Oral Daily Carrion-Carrero, Annielee Jemmott, MD   20 mg at 11/09/23 0910   senna-docusate (Senokot-S) tablet 1 tablet  1 tablet Oral QHS PRN Carrion-Carrero, Marlo, MD       thiamine  (VITAMIN B1) injection 100 mg  100 mg Intramuscular Once Carrion-Carrero, Marlo, MD       thiamine  (VITAMIN B1) tablet 100 mg  100 mg Oral Daily Carrion-Carrero, Sharene Krikorian, MD   100 mg at 11/09/23 0910   Current Outpatient Medications  Medication Sig Dispense Refill   melatonin 5 MG TABS Take 1 tablet (5 mg total) by mouth at  bedtime. 30 tablet 3   acetaminophen  (TYLENOL ) 325 MG tablet Take 2 tablets (650 mg total) by mouth every 6 (six) hours as needed for mild pain (pain score 1-3).     albuterol  (VENTOLIN  HFA) 108 (90 Base) MCG/ACT inhaler Inhale 2 puffs into the lungs every 6 (six) hours as needed for shortness of breath.     ARIPiprazole  (ABILIFY ) 15 MG tablet Take 1 tablet (15 mg total) by mouth daily after breakfast. 30 tablet 0   atorvastatin  (LIPITOR ) 80 MG tablet Take 1 tablet (80 mg total) by mouth daily. 30 tablet 0   diltiazem  (CARDIZEM  CD) 180 MG 24 hr capsule Take 1 capsule (180 mg total) by mouth daily. 30 capsule 0   latanoprost  (XALATAN ) 0.005 % ophthalmic solution Place 1 drop into both eyes at bedtime.     loratadine  (CLARITIN ) 10 MG tablet Take 1 tablet (10 mg total) by mouth once for 1 dose.     mirtazapine  (REMERON ) 30 MG tablet Take 1 tablet (30 mg total) by mouth at bedtime. 30 tablet 0   mometasone -formoterol  (DULERA ) 200-5 MCG/ACT AERO Inhale 2 puffs into the lungs 2 (two) times daily.     Multiple Vitamin (MULTIVITAMIN WITH MINERALS) TABS tablet Take 1 tablet by mouth daily.     pantoprazole  (PROTONIX ) 20 MG tablet Take 1 tablet (20 mg total) by mouth daily. 30 tablet 0   senna-docusate (SENOKOT-S) 8.6-50 MG tablet Take 1 tablet by mouth at bedtime as needed for mild constipation. 30 tablet 0    PTA Medications:  Facility Ordered Medications  Medication   acetaminophen  (TYLENOL ) tablet 650 mg   alum & mag hydroxide-simeth (MAALOX/MYLANTA) 200-200-20 MG/5ML suspension 30 mL   magnesium  hydroxide (MILK OF MAGNESIA) suspension 30 mL   hydrOXYzine  (ATARAX ) tablet 25 mg   albuterol  (VENTOLIN  HFA) 108 (90 Base) MCG/ACT inhaler 2 puff   ARIPiprazole  (ABILIFY ) tablet 15 mg   pantoprazole  (PROTONIX ) EC tablet 20 mg   mirtazapine  (REMERON ) tablet 30 mg   melatonin tablet 5 mg   diltiazem  (CARDIZEM  CD) 24 hr capsule 180 mg   atorvastatin  (LIPITOR ) tablet 80 mg   senna-docusate (Senokot-S)  tablet 1 tablet   mometasone -formoterol  (DULERA ) 200-5 MCG/ACT inhaler 2 puff   latanoprost  (XALATAN ) 0.005 % ophthalmic solution 1 drop   thiamine  (VITAMIN B1) injection 100 mg   thiamine  (VITAMIN B1) tablet 100 mg   multivitamin with minerals tablet 1 tablet   LORazepam  (ATIVAN ) tablet 1 mg   loperamide  (IMODIUM ) capsule 2-4 mg   loratadine  (CLARITIN ) tablet 10 mg   PTA Medications  Medication Sig   melatonin 5 MG TABS Take 1 tablet (5 mg total) by mouth at bedtime.   Multiple Vitamin (MULTIVITAMIN WITH MINERALS) TABS tablet Take 1 tablet by mouth daily.   acetaminophen  (TYLENOL ) 325 MG tablet Take 2 tablets (650 mg total) by  mouth every 6 (six) hours as needed for mild pain (pain score 1-3).   senna-docusate (SENOKOT-S) 8.6-50 MG tablet Take 1 tablet by mouth at bedtime as needed for mild constipation.   pantoprazole  (PROTONIX ) 20 MG tablet Take 1 tablet (20 mg total) by mouth daily.   mirtazapine  (REMERON ) 30 MG tablet Take 1 tablet (30 mg total) by mouth at bedtime.   diltiazem  (CARDIZEM  CD) 180 MG 24 hr capsule Take 1 capsule (180 mg total) by mouth daily.   atorvastatin  (LIPITOR ) 80 MG tablet Take 1 tablet (80 mg total) by mouth daily.   ARIPiprazole  (ABILIFY ) 15 MG tablet Take 1 tablet (15 mg total) by mouth daily after breakfast.   albuterol  (VENTOLIN  HFA) 108 (90 Base) MCG/ACT inhaler Inhale 2 puffs into the lungs every 6 (six) hours as needed for shortness of breath.   latanoprost  (XALATAN ) 0.005 % ophthalmic solution Place 1 drop into both eyes at bedtime.   mometasone -formoterol  (DULERA ) 200-5 MCG/ACT AERO Inhale 2 puffs into the lungs 2 (two) times daily.   loratadine  (CLARITIN ) 10 MG tablet Take 1 tablet (10 mg total) by mouth once for 1 dose.       11/09/2023    8:38 AM 09/21/2023    2:39 PM 09/21/2023    6:56 AM  Depression screen PHQ 2/9  Decreased Interest 1 1 0  Down, Depressed, Hopeless 2 0 0  PHQ - 2 Score 3 1 0  Altered sleeping 1    Tired, decreased energy 2     Change in appetite 1    Feeling bad or failure about yourself  1    Trouble concentrating 0    Moving slowly or fidgety/restless 2    Suicidal thoughts 1    PHQ-9 Score 11      Flowsheet Row ED from 11/07/2023 in [facility] Most recent reading at 11/07/2023  5:29 PM ED from 11/07/2023 in [facility] Most recent reading at 11/07/2023  9:35 AM Admission (Discharged) from 10/11/2023 in [facility] Most recent reading at 10/11/2023  5:00 PM  C-SSRS RISK CATEGORY No Risk High Risk No Risk       Musculoskeletal  Strength & Muscle Tone: within normal limits Gait & Station: normal Patient leans: N/A  Psychiatric Specialty Exam  Presentation  General Appearance:  Appropriate for Environment  Eye Contact: Good  Speech: Clear and Coherent; Normal Rate  Speech Volume: Normal  Handedness: -- (not assessed)   Mood and Affect  Mood: Euthymic  Affect: Constricted   Thought Process  Thought Processes: Linear  Descriptions of Associations:Intact  Orientation:None  Thought Content:Logical  Diagnosis of Schizophrenia or Schizoaffective disorder in past: No    Hallucinations:Hallucinations: None  Ideas of Reference:None  Suicidal Thoughts:Suicidal Thoughts: No  Homicidal Thoughts:Homicidal Thoughts: No   Sensorium  Memory: Immediate Good; Recent Good; Remote Good  Judgment: Fair  Insight: Fair   Chartered Certified Accountant: Fair  Attention Span: Fair  Recall: Fair  Fund of Knowledge: Fair  Language: Fair   Psychomotor Activity  Psychomotor Activity:Psychomotor Activity: Normal   Assets  Assets: Desire for Improvement; Resilience; Communication Skills   Sleep  Sleep:Sleep: Fair   No data recorded  Physical Exam  Physical Exam Vitals and nursing note reviewed.  Constitutional:      General: He is not in acute distress.    Appearance: He is  not ill-appearing.  HENT:     Head: Normocephalic and atraumatic.  Pulmonary:  Effort: Pulmonary effort is normal. No respiratory distress.  Musculoskeletal:        General: Normal range of motion.  Skin:    General: Skin is warm and dry.  Neurological:     General: No focal deficit present.    Review of Systems  All other systems reviewed and are negative.  Blood pressure 138/82, pulse 62, temperature 98.4 F (36.9 C), temperature source Oral, resp. rate 18, SpO2 100%. There is no height or weight on file to calculate BMI.  Demographic Factors:  Male and Age 44 or older  Loss Factors: NA  Historical Factors: Impulsivity  Risk Reduction Factors:   Positive coping skills or problem solving skills  Continued Clinical Symptoms:  Alcohol/Substance Abuse/Dependencies  Cognitive Features That Contribute To Risk:  None    Suicide Risk:  Mild:  Suicidal ideation of limited frequency, intensity, duration, and specificity.  There are no identifiable plans, no associated intent, mild dysphoria and related symptoms, good self-control (both objective and subjective assessment), few other risk factors, and identifiable protective factors, including available and accessible social support.  Plan Of Care/Follow-up recommendations:  Activity: as tolerated  Diet: heart healthy  Other: -Follow-up with your outpatient psychiatric provider -instructions on appointment date, time, and address (location) are provided to you in discharge paperwork.  -Take your psychiatric medications as prescribed at discharge - instructions are provided to you in the discharge paperwork  -If you are prescribed an atypical antipsychotic medication, we recommend that your outpatient psychiatrist follow routine screening for side effects within 3 months of discharge, including monitoring: AIMS scale, height, weight, blood pressure, fasting lipid panel, HbA1c, and fasting blood sugar.   -Recommend total  abstinence from alcohol, tobacco, and other illicit drug use at discharge.   -If your psychiatric symptoms recur, worsen, or if you have side effects to your psychiatric medications, call your outpatient psychiatric provider, 911, [phone] or go to the nearest emergency department.  -If suicidal thoughts occur, immediately call your outpatient psychiatric provider, 911, 988 or go to the nearest emergency department.   Disposition: [facility]   Signed: Marlo Masson, MD 11/09/2023, 9:57 AM

## 2023-11-09 NOTE — Group Note (Signed)
 Group Topic: Relapse and Recovery  Group Date: 11/09/2023 Start Time: 1000 End Time: 1010 Facilitators: Hoke Baer, Zane HERO, RN  Department: [facility]  Number of Participants: 1  Group Focus: discharge education Treatment Modality:  Individual Therapy Interventions utilized were patient education Purpose: discharge and transfer education  Name: Robert Chan Date of Birth: 1956/09/08  MR: [id number]    Level of Participation: active Quality of Participation: attentive and cooperative Interactions with others: gave feedback Mood/Affect: appropriate Triggers (if applicable): none identified Cognition: coherent/clear Progress: Significant Response: Patient voices understanding, no questions at this time Plan: transfer to [facility] later this afternoon  Patients Problems:  Patient Active Problem List   Diagnosis Date Noted   Cocaine abuse (HCC) 10/12/2023   MDD (major depressive disorder), recurrent episode, severe (HCC) 10/11/2023   AKI (acute kidney injury) (HCC) 10/10/2023   Alcohol use with alcohol-induced mood disorder (HCC) 09/16/2023   MDD (major depressive disorder), recurrent severe, without psychosis (HCC) 08/08/2023   MDD (major depressive disorder), recurrent episode, mild (HCC) 07/12/2023   Cocaine use 05/25/2023   PAD (peripheral artery disease) (HCC) 12/01/2021   Atopic dermatitis 04/22/2021   Former tobacco use    Polysubstance abuse (HCC) 09/06/2020   Vitamin D  deficiency 06/23/2020   Coronary artery disease 06/19/2020   History of non-ST elevation myocardial infarction (NSTEMI)    Foot callus 12/12/2019   Age-related nuclear cataract of right eye 09/13/2016   Benign hypertension 07/06/2016   Glaucoma 07/06/2016   Male erectile disorder 07/06/2016   Asthma 10/18/2012   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

## 2023-12-13 ENCOUNTER — Ambulatory Visit: Payer: 59 | Admitting: Podiatry

## 2023-12-15 ENCOUNTER — Ambulatory Visit: Payer: 59 | Admitting: Podiatry

## 2023-12-21 ENCOUNTER — Ambulatory Visit: Payer: 59 | Attending: Cardiology | Admitting: Cardiology

## 2023-12-21 ENCOUNTER — Encounter: Payer: Self-pay | Admitting: Cardiology

## 2023-12-21 VITALS — BP 98/62 | HR 76 | Ht 76.0 in | Wt 267.0 lb

## 2023-12-21 DIAGNOSIS — I2510 Atherosclerotic heart disease of native coronary artery without angina pectoris: Secondary | ICD-10-CM

## 2023-12-21 DIAGNOSIS — R600 Localized edema: Secondary | ICD-10-CM | POA: Diagnosis not present

## 2023-12-21 DIAGNOSIS — I10 Essential (primary) hypertension: Secondary | ICD-10-CM

## 2023-12-21 DIAGNOSIS — E785 Hyperlipidemia, unspecified: Secondary | ICD-10-CM | POA: Diagnosis not present

## 2023-12-21 MED ORDER — ATORVASTATIN CALCIUM 80 MG PO TABS: 80.0000 mg | ORAL_TABLET | Freq: Every day | ORAL | 3 refills | Status: DC

## 2023-12-21 MED ORDER — ATORVASTATIN CALCIUM 80 MG PO TABS
80.0000 mg | ORAL_TABLET | Freq: Every day | ORAL | 3 refills | Status: AC
Start: 2023-12-21 — End: 2024-02-09

## 2023-12-21 MED ORDER — ASPIRIN 81 MG PO TBEC: 81.0000 mg | DELAYED_RELEASE_TABLET | Freq: Every day | ORAL | Status: DC

## 2023-12-21 NOTE — Patient Instructions (Signed)
Medication Instructions:  START Aspirin 81 mg once daily RESTART Atorvastatin 80 mg once daily  *If you need a refill on your cardiac medications before your next appointment, please call your pharmacy*   Lab Work: ALT and Lipid panel in 6 weeks - please have this completed the week on April 3rd (or shortly after) this can be completed at [facility] - no appointment required but this does NEED TO BE FASTING If you have labs (blood work) drawn today and your tests are completely normal, you will receive your results only by: MyChart Message (if you have MyChart) OR A paper copy in the mail If you have any lab test that is abnormal or we need to change your treatment, we will call you to review the results.   Follow-Up: At [facility], you and your health needs are our priority.  As part of our continuing mission to provide you with exceptional heart care, we have created designated Provider Care Teams.  These Care Teams include your primary Cardiologist (physician) and Advanced Practice Providers (APPs -  Physician Assistants and Nurse Practitioners) who all work together to provide you with the care you need, when you need it.  We recommend signing up for the patient portal called "MyChart".  Sign up information is provided on this After Visit Summary.  MyChart is used to connect with patients for Virtual Visits (Telemedicine).  Patients are able to view lab/test results, encounter notes, upcoming appointments, etc.  Non-urgent messages can be sent to your provider as well.   To learn more about what you can do with MyChart, go to [email or web address].    Your next appointment:   1 year(s)  Provider:   Armanda Magic, MD

## 2023-12-21 NOTE — Progress Notes (Signed)
Cardiology Note    Date:  12/21/2023   ID:  Robert Chan, DOB January 23, 1956, MRN [id number]  PCP:  Pcp, No  Cardiologist:  Armanda Magic, MD   Chief Complaint  Patient presents with   Coronary Artery Disease   Hypertension   Hyperlipidemia    History of Present Illness:  Robert Chan is a 68 y.o. male  with a hx of ASCAD, CKD, COPD, depression and HTN.  He presented in August 2021 with NSTEMI and underwent cardiac cath showing 30% prox LAD and mid LAD myocardial bridge and 20% mid RCA with normal LVF and normal LVEDP.  2D echo showed normal LVF with G1DD and no valvular heart disease in Nov 2021.  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   He was seen by Georgie Chard, NP 01/19/2022 complaining of shortness of breath and chest pain.  2D echo showed EF 55 to 60% with mild HK of the mid inferior septal and inferior wall.  Coronary CTA 02/18/2022 demonstrated coronary calcium score of 589 with less than 25% left main, 25 to 49% proximal LAD, less than 25% mid left circumflex.  He presented to the ER 06/08/2022 with chest pain and was evaluated by cardiology.  Chest pain was atypical worse with deep inhalation.  High-sensitivity troponin was normal.  Urine drug screen positive for cocaine.  Felt not to be cardiac related.  He has not followed up since then.  He is here today for followup and is doing well.  He denies any chest pain or pressure, SOB, DOE, PND, orthopnea,  dizziness, palpitations or syncope. He occasionally has some mild LE edema.  He is compliant with his meds and is tolerating meds with no SE.     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    Depression    Elevated serum creatinine 08/28/2019   Homelessness 06/19/2020   Homicidal ideations 05/25/2023   Hypertension    Major depressive disorder, recurrent episode, 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/11/2022   NSTEMI (non-ST elevated myocardial infarction) (HCC)    Schizoaffective disorder (HCC) 11/08/2022   Status post incision and drainage 04/22/2021   Suicidal ideation 06/05/2020    Past Surgical History:  Procedure Laterality Date   APPENDECTOMY     EYE SURGERY     LEFT HEART CATH AND CORONARY ANGIOGRAPHY N/A 06/05/2020   Procedure: LEFT HEART CATH AND CORONARY ANGIOGRAPHY;  Surgeon: Elder Negus, MD;  Location: [facility];  Service: Cardiovascular;  Laterality: N/A;   PROSTATE SURGERY      Current Medications: Current Meds  Medication Sig   albuterol (VENTOLIN HFA) 108 (90 Base) MCG/ACT inhaler Inhale 2 puffs into the lungs every 6 (six) hours as needed for shortness of breath.   ARIPiprazole (ABILIFY) 15 MG tablet Take 1 tablet (15 mg total) by mouth daily after breakfast.   mometasone-formoterol (DULERA) 200-5 MCG/ACT AERO Inhale 2 puffs into the lungs 2 (two) times daily.    Allergies:   Shellfish allergy   Social History   Socioeconomic History   Marital status: Single    Spouse name: Not on file   Number of  children: Not on file   Years of education: Not on file   Highest education level: Not on file  Occupational History   Not on file  Tobacco Use   Smoking status: Some Days    Current packs/day: 0.00    Types: Cigarettes    Last attempt to quit: 12/25/2020    Years since quitting: 2.9   Smokeless tobacco: Never   Tobacco comments:    Smokes a couple cigarettes every now and then  Vaping Use   Vaping status: Never Used  Substance and Sexual Activity   Alcohol use: Yes    Comment: 4 40oz beer a week   Drug use: Yes    Types: Cocaine    Comment: cocaine use once a month, last use 6 mos ago   Sexual activity: Not Currently  Other Topics Concern    Not on file  Social History Narrative   His mother passed at age 13 (when he was 24 years old) and his father raised him and his siblings   His parents were pastors as a lot of his sisters and other family members are today   There was a total of 13 children in his family Had 77 sisters, one sister deceased , Had 3 brothers, all brothers deceased   He is the youngest of the 58 and was "always in trouble in my younger years"   Family still live in [place], [place], some in [place], [place]   Has a Daughter and son with a total of 10 grandchildren (6 grand daughters local and 4 grandsons)    Values family   Married twice, present wife incarcerated   Social Drivers of Corporate investment banker Strain: Low Risk  (10/19/2022)   Overall Financial Resource Strain (CARDIA)    Difficulty of Paying Living Expenses: Not hard at all  Food Insecurity: No Food Insecurity (10/11/2023)   Hunger Vital Sign    Worried About Running Out of Food in the Last Year: Never true    Ran Out of Food in the Last Year: Never true  Recent Concern: Food Insecurity - Food Insecurity Present (09/16/2023)   Hunger Vital Sign    Worried About Running Out of Food in the Last Year: Sometimes true    Ran Out of Food in the Last Year: Sometimes true  Transportation Needs: No Transportation Needs (10/11/2023)   PRAPARE - Administrator, Civil Service (Medical): No    Lack of Transportation (Non-Medical): No  Recent Concern: Transportation Needs - Unmet Transportation Needs (10/10/2023)   PRAPARE - Transportation    Lack of Transportation (Medical): Yes    Lack of Transportation (Non-Medical): Yes  Physical Activity: Inactive (10/19/2022)   Exercise Vital Sign    Days of Exercise per Week: 0 days    Minutes of Exercise per Session: 0 min  Stress: No Stress Concern Present (10/19/2022)   [name] of Occupational Health - Occupational Stress Questionnaire    Feeling of Stress : Not at all  Social  Connections: Unknown (11/01/2023)   Social Connection and Isolation Panel [NHANES]    Frequency of Communication with Friends and Family: Three times a week    Frequency of Social Gatherings with Friends and Family: Three times a week    Attends Religious Services: Not on file    Active Member of Clubs or Organizations: Not on file    Attends Club or Organization Meetings: Not on file    Marital Status: Not on  file     Family History:  The patient's family history includes Diabetes in his mother; Hypertension in his father, mother, and sister.   ROS:   Please see the history of present illness.    ROS All other systems reviewed and are negative.      No data to display             PHYSICAL EXAM:   VS:  BP 98/62   Pulse 76   Ht 6\' 4"  (1.93 m)   Wt 267 lb (121.1 kg)   BMI 32.50 kg/m    GEN: Well nourished, well developed in no acute distress HEENT: Normal NECK: No JVD; No carotid bruits LYMPHATICS: No lymphadenopathy CARDIAC:RRR, no murmurs, rubs, gallops RESPIRATORY:  Clear to auscultation without rales, wheezing or rhonchi  ABDOMEN: Soft, non-tender, non-distended MUSCULOSKELETAL:  No edema; No deformity  SKIN: Warm and dry NEUROLOGIC:  Alert and oriented x 3 PSYCHIATRIC:  Normal affect  Wt Readings from Last 3 Encounters:  12/21/23 267 lb (121.1 kg)  10/10/23 250 lb (113.4 kg)  09/27/23 256 lb (116.1 kg)      Studies/Labs Reviewed:    Cardiac Cath 06/2020 Coronary Findings   Diagnostic Dominance: Right  Left Main  Vessel is normal in caliber.  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  Left Circumflex  Vessel is normal in caliber. Vessel is angiographically normal.  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   Intervention   No interventions have been documented.  2D echo 09/2020 IMPRESSIONS    1. Left ventricular ejection fraction, by estimation, is 60 to 65%. The  left ventricle  has normal function. The left ventricle has no regional  wall motion abnormalities. Left ventricular diastolic parameters are  consistent with Grade I diastolic  dysfunction (impaired relaxation).   2. Right ventricular systolic function is normal. The right ventricular  size is normal.   3. The mitral valve is normal in structure. No evidence of mitral valve  regurgitation. No evidence of mitral stenosis.   4. The aortic valve is normal in structure. Aortic valve regurgitation is  not visualized. No aortic stenosis is present.   Recent Labs: 09/15/2023: TSH 3.515 11/08/2023: ALT 18; BUN 14; Creatinine, Ser 1.25; Hemoglobin 12.8; Platelets 299; Potassium 4.2; Sodium 137   Lipid Panel    Component Value Date/Time   CHOL 159 09/15/2023 1652   CHOL 173 05/17/2022 1047   TRIG 53 09/15/2023 1652   HDL 61 09/15/2023 1652   HDL 53 05/17/2022 1047   CHOLHDL 2.6 09/15/2023 1652   VLDL 11 09/15/2023 1652   LDLCALC 87 09/15/2023 1652   LDLCALC 104 (H) 05/17/2022 1047    Additional studies/ records that were reviewed today include:  none  ASSESSMENT:    1. Coronary artery disease involving native coronary artery of native heart without angina pectoris   2. Hyperlipidemia LDL goal <70   3. Essential hypertension   4. Leg edema       PLAN:  In order of problems listed above:  ASCAD -s/p remote NSTEMI in Aug 2021 -cardiac cath showed minimal nno obstructive CAD with 20% mid RCA and 30% prox LAD and mid LAD myocardial bridge.  -it was felt that his NSTEMI was due to demand ischemia from coronary vasospasm in the setting of cocaine -Lexiscan Myoview 01/18/2021 for chest pain was normal -Coronary CTA for chest pain 02/18/2022 demonstrated coronary calcium score of 589 with  less than 25% left main, 25 to 49% proximal LAD, less than 25% mid left circumflex. -ER visit 06/20/2022 for chest pain in the setting of cocaine abuse and normal cardiac enzymes felt noncardiac -He has chronic chest  pain and shortness of breath with his COPD.  This has been going on for years and is chest pain is not felt to be cardiac related -he denies any recent episodes of CP or SOB -restart Atorvastatin 80mg  daily>>he has been noncompliant -start ASA 81mg  daily  -2.  HLD -LDL goal < 70 -I have personally reviewed and interpreted outside labs performed by patient's PCP which showed LDL 87 and HDL 61 and ALT 15 on 09/15/2023 -he stopped taking Atorvastatin so will restart at 80mg  daily -Repeat FLP and ALT in 6 weeks  3.  HTN -BP controlled on exam today and actually on the soft side -he stopped taking his Cardizem   4.  LE edema -this is likely exacerbated but poor high Na diet that he admits to -Stressed the importance of following HD less than 2 g sodium diet>>encouraged him not to salt his food  Followup with me in 1 year   Medication Adjustments/Labs and Tests Ordered: Current medicines are reviewed at length with the patient today.  Concerns regarding medicines are outlined above.  Medication changes, Labs and Tests ordered today are listed in the Patient Instructions below.  There are no Patient Instructions on file for this visit.   Signed, Armanda Magic, MD  12/21/2023 10:28 AM    [facility] [address] [place], [place], [place]  [address] Phone: [phone]; Fax: [phone]

## 2023-12-29 ENCOUNTER — Ambulatory Visit: Payer: 59 | Attending: Physician Assistant | Admitting: Physician Assistant

## 2023-12-29 ENCOUNTER — Telehealth (HOSPITAL_COMMUNITY): Payer: Self-pay

## 2023-12-29 VITALS — BP 118/78 | HR 78 | Ht 76.0 in | Wt 265.8 lb

## 2023-12-29 DIAGNOSIS — K5900 Constipation, unspecified: Secondary | ICD-10-CM

## 2023-12-29 DIAGNOSIS — R3911 Hesitancy of micturition: Secondary | ICD-10-CM | POA: Diagnosis not present

## 2023-12-29 DIAGNOSIS — J4489 Other specified chronic obstructive pulmonary disease: Secondary | ICD-10-CM

## 2023-12-29 DIAGNOSIS — H2511 Age-related nuclear cataract, right eye: Secondary | ICD-10-CM

## 2023-12-29 DIAGNOSIS — I252 Old myocardial infarction: Secondary | ICD-10-CM

## 2023-12-29 DIAGNOSIS — F1910 Other psychoactive substance abuse, uncomplicated: Secondary | ICD-10-CM

## 2023-12-29 DIAGNOSIS — F4321 Adjustment disorder with depressed mood: Secondary | ICD-10-CM

## 2023-12-29 DIAGNOSIS — R5383 Other fatigue: Secondary | ICD-10-CM

## 2023-12-29 LAB — POCT URINALYSIS DIP (CLINITEK)
Blood, UA: NEGATIVE
Glucose, UA: NEGATIVE mg/dL
Ketones, POC UA: NEGATIVE mg/dL
Leukocytes, UA: NEGATIVE
Nitrite, UA: NEGATIVE
POC PROTEIN,UA: NEGATIVE
Spec Grav, UA: >=1.03 — AB (ref 1.010–1.025)
Urobilinogen, UA: 1 U/dL
pH, UA: 5.5 (ref 5.0–8.0)

## 2023-12-29 MED ORDER — DOCUSATE SODIUM 100 MG PO CAPS: 100.0000 mg | ORAL_CAPSULE | Freq: Two times a day (BID) | ORAL | 3 refills | Status: DC

## 2023-12-29 MED ORDER — ALBUTEROL SULFATE HFA 108 (90 BASE) MCG/ACT IN AERS: 2.0000 | INHALATION_SPRAY | Freq: Four times a day (QID) | RESPIRATORY_TRACT | 1 refills | Status: DC | PRN

## 2023-12-29 MED ORDER — SERTRALINE HCL 50 MG PO TABS: 50.0000 mg | ORAL_TABLET | Freq: Every day | ORAL | 3 refills | Status: DC

## 2023-12-29 MED ORDER — MOMETASONE FURO-FORMOTEROL FUM 200-5 MCG/ACT IN AERO: 2.0000 | INHALATION_SPRAY | Freq: Two times a day (BID) | RESPIRATORY_TRACT | 3 refills | Status: DC

## 2023-12-29 NOTE — Patient Instructions (Addendum)
 Charlotta Newton.org is the website for narcotics anonymous TonerProviders.com.cy (website) or [phone] is the information for alcoholics anonymous Both are free and immediately available for help with alcohol and drug use  Take 1/2 tab sertraline for 1 week then increase to 1 tab daily drink 64 to 80 ounces water daily

## 2023-12-29 NOTE — Telephone Encounter (Signed)
 Robert Chan, [facility] is referring Robert Chan to [place].  This therapist calls Robert Chan and leaves a voice mail requesting a return call.  Robert Eisenmenger, MS, LMFT, LCAS 12-29-23

## 2023-12-29 NOTE — Progress Notes (Signed)
 Patient ID: Robert Chan, male   DOB: [birth year], 68 y.o.   MRN: [id number]   Robert Chan, is a 68 y.o. male  WUJ:[id number]  [id number]  DOB - 1956/08/12  Chief Complaint  Patient presents with   Medical Management of Chronic Issues    Referral to pulmonology/urology Constipation Refill on eye drop and other meds.       Subjective:   Robert Chan is a 68 y.o. male here today to reestablish care.  He is here with his daughter.  He is about 25 days clean off alcohol and cocaine.  He is trying to get his health and life back in order.  His daughter is about 9 months clean.  He is currently not participating in a recovery program.  He lives in an area where he is surrounded by drug use.  He lives independently.  H/o NSTEMI in 2021.  He was just seen by cardiology and restarted on atorvastatin and aspirin.  He is here today for labs and to get a PCP again.   Wants PSA drawn.  He is having urinary hesitancy and has a h/o some sort of prostate issue but is unsure what the issue was(he was living in [place]-says it was NOT cancer).  He is having some situational depression and anxiety related to substance cessation.  He denies mania/psychosis.  His daughter took zoloft and it helped her.  He would like to try this.  He has had psychiatric admissions but always drug related.    We also reviewed recent 6 months of labs of normal A1C, lipids, etc but cardiology wants his LDL <70 so they still resumed atorvastatin.  He denies any chest pain.  He does need inhaler RF and referral to pulmonology.   Also has glaucoma and needs to re establish with opthalmology    No problems updated.  ALLERGIES: Allergies  Allergen Reactions   Shellfish Allergy Hives and Rash    PAST MEDICAL HISTORY: Past Medical History:  Diagnosis Date   Acute bilateral low back pain without sciatica 02/27/2021   Asthma    CAD (coronary artery disease)    Cataract    CKD (chronic kidney disease)     Cocaine use disorder, mild, abuse (HCC)    COPD (chronic obstructive pulmonary disease) (HCC)    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   Depression    Elevated serum creatinine 08/28/2019   Homelessness 06/19/2020   Homicidal ideations 05/25/2023   Hypertension    Major depressive disorder, recurrent episode, 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/11/2022   NSTEMI (non-ST elevated myocardial infarction) (HCC)    Schizoaffective disorder (HCC) 11/08/2022   Status post incision and drainage 04/22/2021   Suicidal ideation 06/05/2020    MEDICATIONS AT HOME: Prior to Admission medications   Medication Sig Start Date End Date Taking? Authorizing Provider  aspirin EC 81 MG tablet Take 1 tablet (81 mg total) by mouth daily. Swallow whole. 12/21/23  Yes Turner, Cornelious Bryant, MD  docusate sodium (COLACE) 100 MG capsule Take 1 capsule (100 mg total) by mouth 2 (two) times daily. 12/29/23  Yes Georgian Co M, PA-C  sertraline (ZOLOFT) 50 MG tablet Take 1 tablet (50 mg total) by mouth daily. 12/29/23  Yes Anders Simmonds, PA-C  acetaminophen (TYLENOL) 325 MG tablet Take 2 tablets (650 mg total) by mouth every 6 (six) hours as needed for mild pain (pain score 1-3). Patient not taking: Reported on 12/29/2023 11/09/23   Lorri Frederick, MD  albuterol (VENTOLIN HFA) 108 (90 Base) MCG/ACT inhaler  Inhale 2 puffs into the lungs every 6 (six) hours as needed for shortness of breath. 12/29/23   Anders Simmonds, PA-C  atorvastatin (LIPITOR) 80 MG tablet Take 1 tablet (80 mg total) by mouth daily. Patient not taking: Reported on 12/29/2023 12/21/23 01/20/24  Quintella Reichert, MD  latanoprost (XALATAN) 0.005 % ophthalmic solution Place 1 drop into both eyes at bedtime. Patient not taking: Reported on 12/29/2023 11/09/23   Lorri Frederick, MD  loratadine (CLARITIN) 10 MG tablet Take 1 tablet (10 mg total) by mouth once for 1 dose. 11/09/23 11/09/23  Carrion-Carrero, Karle Starch, MD  mometasone-formoterol (DULERA) 200-5 MCG/ACT AERO Inhale  2 puffs into the lungs 2 (two) times daily. 12/29/23   Anders Simmonds, PA-C  Multiple Vitamin (MULTIVITAMIN WITH MINERALS) TABS tablet Take 1 tablet by mouth daily. Patient not taking: Reported on 12/29/2023 11/09/23   Lorri Frederick, MD  pantoprazole (PROTONIX) 20 MG tablet Take 1 tablet (20 mg total) by mouth daily. Patient not taking: Reported on 12/29/2023 11/09/23   Lorri Frederick, MD  senna-docusate (SENOKOT-S) 8.6-50 MG tablet Take 1 tablet by mouth at bedtime as needed for mild constipation. Patient not taking: Reported on 12/29/2023 11/09/23   Carrion-Carrero, Karle Starch, MD    ROS: Neg HEENT Neg resp Neg cardiac Neg GI Neg GU Neg MS Neg neuro  Objective:   Vitals:   12/29/23 1529  BP: 118/78  Pulse: 78  SpO2: 97%  Weight: 265 lb 12.8 oz (120.6 kg)  Height: 6\' 4"  (1.93 m)   Exam General appearance : Awake, alert, not in any distress. Speech Clear. Not toxic looking HEENT: Atraumatic and Normocephalic Neck: Supple, no JVD. No cervical lymphadenopathy.  Chest: Fair air entry bilaterally, CTAB.  No rales/rhonchi/wheezing CVS: S1 S2 regular, no murmurs.  Extremities: B/L Lower Ext shows no edema, both legs are warm to touch Neurology: Awake alert, and oriented X 3, CN II-XII intact, Non focal Skin: No Rash  Data Review Lab Results  Component Value Date   HGBA1C 5.1 09/15/2023   HGBA1C 5.2 01/13/2023   HGBA1C 4.8 09/07/2020    Assessment & Plan   1. Urinary hesitancy (Primary) Increase water intake.  Bili in urine(rechecking LFT) - PSA - CMP14+EGFR - POCT URINALYSIS DIP (CLINITEK)  2. Polysubstance abuse (HCC) Charlotta Newton.org is the website for narcotics anonymous TonerProviders.com.cy (website) or [phone] is the information for alcoholics anonymous Both are free and immediately available for help with alcohol and drug use He does seem open to this and it will likely be imperative to have this support to stay clean and sober given the environment he  lives in - B12 and Folate Panel - CBC with Differential  3. COPD with chronic bronchitis (HCC) - albuterol (VENTOLIN HFA) 108 (90 Base) MCG/ACT inhaler; Inhale 2 puffs into the lungs every 6 (six) hours as needed for shortness of breath.  Dispense: 18 g; Refill: 1 - mometasone-formoterol (DULERA) 200-5 MCG/ACT AERO; Inhale 2 puffs into the lungs 2 (two) times daily.  Dispense: 13 g; Refill: 3 - Ambulatory referral to Pulmonology  4. Age-related nuclear cataract of right eye And glaucoma - Ambulatory referral to Ophthalmology  5. History of non-ST elevation myocardial infarction (NSTEMI) 2021-believed to be cocaine related.  Seen this month by cardiology - CMP14+EGFR  6. Other fatigue H/o borderline b12 deficiency - B12 and Folate Panel - CBC with Differential  7. Constipation, unspecified constipation type - docusate sodium (COLACE) 100 MG capsule; Take 1 capsule (100 mg total) by mouth 2 (two) times  daily.  Dispense: 60 capsule; Refill: 3  8. Situational depression Can try- - sertraline (ZOLOFT) 50 MG tablet; Take 1 tablet (50 mg total) by mouth daily.  Dispense: 30 tablet; Refill: 3    Return in about 6 weeks (around 02/09/2024) for PCP for chronic conditions-assign new PCP and recheck depression.  The patient was given clear instructions to go to ER or return to medical center if symptoms don't improve, worsen or new problems develop. The patient verbalized understanding. The patient was told to call to get lab results if they haven't heard anything in the next week.      Georgian Co, PA-C [facility] [place], [place] [phone]   12/29/2023, 5:26 PM

## 2023-12-30 ENCOUNTER — Telehealth: Payer: Self-pay

## 2023-12-30 ENCOUNTER — Encounter: Payer: Self-pay | Admitting: Physician Assistant

## 2023-12-30 LAB — PSA: Prostate Specific Ag, Serum: 0.4 ng/mL (ref 0.0–4.0)

## 2023-12-30 LAB — CBC WITH DIFFERENTIAL/PLATELET
Basophils Absolute: 0.1 10*3/uL (ref 0.0–0.2)
Basos: 1 %
EOS (ABSOLUTE): 0.1 10*3/uL (ref 0.0–0.4)
Eos: 2 %
Hematocrit: 40 % (ref 37.5–51.0)
Hemoglobin: 13 g/dL (ref 13.0–17.7)
Immature Grans (Abs): 0 10*3/uL (ref 0.0–0.1)
Immature Granulocytes: 0 %
Lymphocytes Absolute: 2.2 10*3/uL (ref 0.7–3.1)
Lymphs: 27 %
MCH: 29.7 pg (ref 26.6–33.0)
MCHC: 32.5 g/dL (ref 31.5–35.7)
MCV: 91 fL (ref 79–97)
Monocytes Absolute: 0.8 10*3/uL (ref 0.1–0.9)
Monocytes: 10 %
Neutrophils Absolute: 4.9 10*3/uL (ref 1.4–7.0)
Neutrophils: 60 %
Platelets: 204 10*3/uL (ref 150–450)
RBC: 4.38 x10E6/uL (ref 4.14–5.80)
RDW: 13 % (ref 11.6–15.4)
WBC: 8.1 10*3/uL (ref 3.4–10.8)

## 2023-12-30 LAB — CMP14+EGFR
ALT: 13 [IU]/L (ref 0–44)
AST: 21 [IU]/L (ref 0–40)
Albumin: 4.1 g/dL (ref 3.9–4.9)
Alkaline Phosphatase: 81 [IU]/L (ref 44–121)
BUN/Creatinine Ratio: 10 (ref 10–24)
BUN: 16 mg/dL (ref 8–27)
Bilirubin Total: 0.4 mg/dL (ref 0.0–1.2)
CO2: 16 mmol/L — ABNORMAL LOW (ref 20–29)
Calcium: 9.2 mg/dL (ref 8.6–10.2)
Chloride: 105 mmol/L (ref 96–106)
Creatinine, Ser: 1.55 mg/dL — ABNORMAL HIGH (ref 0.76–1.27)
Globulin, Total: 3.8 g/dL (ref 1.5–4.5)
Glucose: 105 mg/dL — ABNORMAL HIGH (ref 70–99)
Potassium: 5 mmol/L (ref 3.5–5.2)
Sodium: 140 mmol/L (ref 134–144)
Total Protein: 7.9 g/dL (ref 6.0–8.5)
eGFR: 48 mL/min/{1.73_m2} — ABNORMAL LOW (ref >59)

## 2023-12-30 LAB — B12 AND FOLATE PANEL
Folate: 10 ng/mL (ref >3.0)
Vitamin B-12: 518 pg/mL (ref 232–1245)

## 2023-12-30 NOTE — Telephone Encounter (Signed)
 Patient viewed results and Doctor comment through [facility]

## 2023-12-30 NOTE — Telephone Encounter (Signed)
-----   Message from Georgian Co sent at 12/30/2023 11:35 AM EST ----- Increase your water intake. Your kidneys show stress and being hydrated will help.  Liver function and electrolytes are normal.  PSA(prostate) is normal.  B12 and folate are normal.  And your blood count is normal.  No anemia.  Follow up as planned.  Georgian Co, PA-C

## 2024-01-02 ENCOUNTER — Ambulatory Visit (HOSPITAL_COMMUNITY): Payer: 59

## 2024-01-02 DIAGNOSIS — F1420 Cocaine dependence, uncomplicated: Secondary | ICD-10-CM

## 2024-01-02 DIAGNOSIS — F1020 Alcohol dependence, uncomplicated: Secondary | ICD-10-CM

## 2024-01-02 DIAGNOSIS — F1994 Other psychoactive substance use, unspecified with psychoactive substance-induced mood disorder: Secondary | ICD-10-CM

## 2024-01-02 NOTE — Progress Notes (Signed)
 Individual Therapy  Time:  1:45 pm to 2:30 pm  Therapist Intervention: Motivational Interviewing/answered question regarding CD-IOP group including purpose, hours of operation/Inquire about patterns of use, last use, desire to stop/motivational level/discussed living in a sober living/Therapist discuss attending AA or A meetings  Summary:  Robert Chan presents with his daughter today to discuss CD-IOP services.  He reports she would like to stop using cocaine. Robert Chan says he used cocaine 2 days ago.  He reports he drank two (40) oz beers yesterday and the last time he drank prior to yesterday was 21 days ago.  Robert Chan says he went to [employer] for 2 weeks.  His daughter says she can tell he is "clearer" since he went.  Robert Chan has a history of being hospitalized for S/HI 6-8 inpt stays in the past few months.  Robert Chan says he is tired to living "like this."  Robert Chan [place] and addiction is abundant.  He reports he has 3-4 friend who are all addicts.  Robert Chan says he is not sure that he wants to to using alcohol. He says that sometimes he feels like he has a problem. He says that he would like to stop using crack/cocaine because it takes his money that he could be spending on other things. Robert Chan says he uses crack because it has become a habit  Robert Chan reports he lost 3 brothers to addiction.  His daughter is currently in recovery.  Robert Chan says he may be moving to [place]. He explains one of his sisters died yesterday and they are going to [place] this week to see if he needs to move back to take care of his sister who has dementia.  Daughter reports his MD prescribed Zoloft.  She notes her father has not stayed on his medication in the past.    Robert Chan's daughter says she will locate an AA or  NA meeting and they will try to go together   Suicidal or Homicidal: denies  Plan: Robert Chan will return in one week to further discuss his treatment needs. 01-09-24  at 1pm  Robert Eisenmenger, MS, LMFT, LCAS Alene Mires, MA, LCSW, [facility], LCAS

## 2024-01-09 ENCOUNTER — Ambulatory Visit (HOSPITAL_COMMUNITY)

## 2024-02-08 ENCOUNTER — Ambulatory Visit: Admitting: Podiatry

## 2024-02-09 ENCOUNTER — Emergency Department (HOSPITAL_COMMUNITY)
Admission: EM | Admit: 2024-02-09 | Discharge: 2024-02-10 | Disposition: A | Discharge status: Other Acute Inpatient Hospital | Attending: Emergency Medicine | Admitting: Emergency Medicine

## 2024-02-09 ENCOUNTER — Other Ambulatory Visit: Payer: Self-pay

## 2024-02-09 ENCOUNTER — Emergency Department (HOSPITAL_COMMUNITY)

## 2024-02-09 ENCOUNTER — Encounter (HOSPITAL_COMMUNITY): Payer: Self-pay | Admitting: Psychiatry

## 2024-02-09 DIAGNOSIS — Z7951 Long term (current) use of inhaled steroids: Secondary | ICD-10-CM | POA: Insufficient documentation

## 2024-02-09 DIAGNOSIS — F1490 Cocaine use, unspecified, uncomplicated: Secondary | ICD-10-CM | POA: Diagnosis not present

## 2024-02-09 DIAGNOSIS — J449 Chronic obstructive pulmonary disease, unspecified: Secondary | ICD-10-CM | POA: Insufficient documentation

## 2024-02-09 DIAGNOSIS — R45851 Suicidal ideations: Secondary | ICD-10-CM | POA: Insufficient documentation

## 2024-02-09 DIAGNOSIS — R059 Cough, unspecified: Secondary | ICD-10-CM | POA: Diagnosis not present

## 2024-02-09 DIAGNOSIS — F1090 Alcohol use, unspecified, uncomplicated: Secondary | ICD-10-CM | POA: Diagnosis not present

## 2024-02-09 DIAGNOSIS — F32A Depression, unspecified: Secondary | ICD-10-CM | POA: Diagnosis present

## 2024-02-09 DIAGNOSIS — I2510 Atherosclerotic heart disease of native coronary artery without angina pectoris: Secondary | ICD-10-CM | POA: Insufficient documentation

## 2024-02-09 DIAGNOSIS — F1020 Alcohol dependence, uncomplicated: Secondary | ICD-10-CM | POA: Diagnosis present

## 2024-02-09 DIAGNOSIS — I10 Essential (primary) hypertension: Secondary | ICD-10-CM | POA: Diagnosis not present

## 2024-02-09 DIAGNOSIS — R079 Chest pain, unspecified: Secondary | ICD-10-CM | POA: Insufficient documentation

## 2024-02-09 DIAGNOSIS — F332 Major depressive disorder, recurrent severe without psychotic features: Secondary | ICD-10-CM | POA: Diagnosis not present

## 2024-02-09 DIAGNOSIS — R0981 Nasal congestion: Secondary | ICD-10-CM | POA: Diagnosis not present

## 2024-02-09 DIAGNOSIS — R0602 Shortness of breath: Secondary | ICD-10-CM | POA: Insufficient documentation

## 2024-02-09 DIAGNOSIS — F17200 Nicotine dependence, unspecified, uncomplicated: Secondary | ICD-10-CM | POA: Insufficient documentation

## 2024-02-09 DIAGNOSIS — F1410 Cocaine abuse, uncomplicated: Secondary | ICD-10-CM

## 2024-02-09 DIAGNOSIS — F1910 Other psychoactive substance abuse, uncomplicated: Secondary | ICD-10-CM | POA: Insufficient documentation

## 2024-02-09 DIAGNOSIS — Z7982 Long term (current) use of aspirin: Secondary | ICD-10-CM | POA: Insufficient documentation

## 2024-02-09 DIAGNOSIS — Z79899 Other long term (current) drug therapy: Secondary | ICD-10-CM | POA: Diagnosis not present

## 2024-02-09 DIAGNOSIS — F1990 Other psychoactive substance use, unspecified, uncomplicated: Secondary | ICD-10-CM

## 2024-02-09 LAB — CBC
HCT: 42.3 % (ref 39.0–52.0)
Hemoglobin: 13.6 g/dL (ref 13.0–17.0)
MCH: 29.9 pg (ref 26.0–34.0)
MCHC: 32.2 g/dL (ref 30.0–36.0)
MCV: 93 fL (ref 80.0–100.0)
Platelets: 275 10*3/uL (ref 150–400)
RBC: 4.55 MIL/uL (ref 4.22–5.81)
RDW: 14 % (ref 11.5–15.5)
WBC: 8.7 10*3/uL (ref 4.0–10.5)
nRBC: 0 % (ref 0.0–0.2)

## 2024-02-09 LAB — ETHANOL: Alcohol, Ethyl (B): <10 mg/dL (ref <10)

## 2024-02-09 LAB — TROPONIN I (HIGH SENSITIVITY)
Troponin I (High Sensitivity): 16 ng/L (ref <18)
Troponin I (High Sensitivity): 14 ng/L (ref <18)

## 2024-02-09 LAB — BASIC METABOLIC PANEL WITH GFR
Anion gap: 12 (ref 5–15)
BUN: 9 mg/dL (ref 8–23)
CO2: 21 mmol/L — ABNORMAL LOW (ref 22–32)
Calcium: 9 mg/dL (ref 8.9–10.3)
Chloride: 106 mmol/L (ref 98–111)
Creatinine, Ser: 1.28 mg/dL — ABNORMAL HIGH (ref 0.61–1.24)
GFR, Estimated: >60 mL/min (ref >60)
Glucose, Bld: 95 mg/dL (ref 70–99)
Potassium: 3.8 mmol/L (ref 3.5–5.1)
Sodium: 139 mmol/L (ref 135–145)

## 2024-02-09 LAB — RESP PANEL BY RT-PCR (RSV, FLU A&B, COVID)  RVPGX2
Influenza A by PCR: NEGATIVE
Influenza B by PCR: NEGATIVE
Resp Syncytial Virus by PCR: NEGATIVE
SARS Coronavirus 2 by RT PCR: NEGATIVE

## 2024-02-09 MED ORDER — ALUM & MAG HYDROXIDE-SIMETH 200-200-20 MG/5ML PO SUSP: 30.0000 mL | Freq: Four times a day (QID) | ORAL | Status: DC | PRN

## 2024-02-09 MED ORDER — PANTOPRAZOLE SODIUM 20 MG PO TBEC
20.0000 mg | DELAYED_RELEASE_TABLET | Freq: Every day | ORAL | Status: DC
Start: 2024-02-09 — End: 2024-02-10
  Administered 2024-02-09: 20 mg via ORAL
  Filled 2024-02-09: qty 1

## 2024-02-09 MED ORDER — LOPERAMIDE HCL 2 MG PO CAPS: 2.0000 mg | ORAL_CAPSULE | ORAL | Status: DC | PRN

## 2024-02-09 MED ORDER — AMLODIPINE BESYLATE 5 MG PO TABS
5.0000 mg | ORAL_TABLET | Freq: Every day | ORAL | Status: DC
  Administered 2024-02-09: 5 mg via ORAL
  Filled 2024-02-09: qty 1

## 2024-02-09 MED ORDER — SERTRALINE HCL 50 MG PO TABS
50.0000 mg | ORAL_TABLET | Freq: Every day | ORAL | Status: DC
  Administered 2024-02-09: 50 mg via ORAL
  Filled 2024-02-09: qty 1

## 2024-02-09 MED ORDER — ATORVASTATIN CALCIUM 80 MG PO TABS
80.0000 mg | ORAL_TABLET | Freq: Every day | ORAL | Status: DC
  Administered 2024-02-09: 80 mg via ORAL
  Filled 2024-02-09: qty 1

## 2024-02-09 MED ORDER — ASPIRIN 81 MG PO TBEC
81.0000 mg | DELAYED_RELEASE_TABLET | Freq: Every day | ORAL | Status: DC
  Administered 2024-02-09: 81 mg via ORAL
  Filled 2024-02-09: qty 1

## 2024-02-09 MED ORDER — KETOROLAC TROMETHAMINE 15 MG/ML IJ SOLN
15.0000 mg | Freq: Once | INTRAMUSCULAR | Status: AC
  Administered 2024-02-09: 15 mg via INTRAVENOUS
  Filled 2024-02-09: qty 1

## 2024-02-09 MED ORDER — LORATADINE 10 MG PO TABS
10.0000 mg | ORAL_TABLET | Freq: Once | ORAL | Status: AC
  Administered 2024-02-09: 10 mg via ORAL
  Filled 2024-02-09: qty 1

## 2024-02-09 MED ORDER — ALBUTEROL SULFATE HFA 108 (90 BASE) MCG/ACT IN AERS: 2.0000 | INHALATION_SPRAY | RESPIRATORY_TRACT | Status: DC | PRN

## 2024-02-09 MED ORDER — LORAZEPAM 1 MG PO TABS: 1.0000 mg | ORAL_TABLET | Freq: Four times a day (QID) | ORAL | Status: DC | PRN

## 2024-02-09 MED ORDER — THIAMINE HCL 100 MG/ML IJ SOLN: 100.0000 mg | Freq: Once | INTRAMUSCULAR | Status: DC

## 2024-02-09 MED ORDER — THIAMINE MONONITRATE 100 MG PO TABS: 100.0000 mg | ORAL_TABLET | Freq: Every day | ORAL | Status: DC

## 2024-02-09 MED ORDER — ADULT MULTIVITAMIN W/MINERALS CH
1.0000 | ORAL_TABLET | Freq: Every day | ORAL | Status: DC
  Administered 2024-02-09: 1 via ORAL
  Filled 2024-02-09: qty 1

## 2024-02-09 MED ORDER — METHOCARBAMOL 500 MG PO TABS
500.0000 mg | ORAL_TABLET | Freq: Once | ORAL | Status: AC
  Administered 2024-02-09: 500 mg via ORAL
  Filled 2024-02-09: qty 1

## 2024-02-09 MED ORDER — NICOTINE 21 MG/24HR TD PT24
21.0000 mg | MEDICATED_PATCH | Freq: Every day | TRANSDERMAL | Status: DC
  Filled 2024-02-09: qty 1

## 2024-02-09 MED ORDER — HYDROXYZINE HCL 25 MG PO TABS: 25.0000 mg | ORAL_TABLET | Freq: Four times a day (QID) | ORAL | Status: DC | PRN

## 2024-02-09 MED ORDER — FLUTICASONE FUROATE-VILANTEROL 200-25 MCG/ACT IN AEPB
1.0000 | INHALATION_SPRAY | Freq: Every day | RESPIRATORY_TRACT | Status: DC
  Administered 2024-02-09: 1 via RESPIRATORY_TRACT
  Filled 2024-02-09: qty 28

## 2024-02-09 MED ORDER — ONDANSETRON 4 MG PO TBDP: 4.0000 mg | ORAL_TABLET | Freq: Four times a day (QID) | ORAL | Status: DC | PRN

## 2024-02-09 NOTE — ED Provider Triage Note (Signed)
 Emergency Medicine Provider Triage Evaluation Note  Robert Chan , a 68 y.o. male  was evaluated in triage.  Pt complains of chest pain. He reports 1 day of central chest pain with radiation towards the right arm. Denies any notable SOB with chest pain. He reports mild nausea but no vomiting or diaphoresis. History of COPD and asthma as well as prior NSTEMI.  Review of Systems  Positive: As above Negative: As above  Physical Exam  BP 104/68 (BP Location: Left Arm)   Pulse 85   Temp 97.8 F (36.6 C)   Resp 18   Ht 6\' 4"  (1.93 m)   Wt 108.9 kg   SpO2 97%   BMI 29.21 kg/m  Gen:   Awake, no distress Resp:  Normal effort, no wheezing, rales, or rhonchi MSK:   Moves extremities without difficulty  Other:    Medical Decision Making  Medically screening exam initiated at 12:43 PM.  Appropriate orders placed.  Allison Silva was informed that the remainder of the evaluation will be completed by another provider, this initial triage assessment does not replace that evaluation, and the importance of remaining in the ED until their evaluation is complete.     Smitty Knudsen, PA-C 02/09/24 1245
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
## 2024-02-09 NOTE — ED Triage Notes (Signed)
 Pt. Stated, Im having chest pressure since yesterday . Like something sitting on my chest

## 2024-02-09 NOTE — ED Notes (Signed)
Pt belongings in locker 3.  

## 2024-02-09 NOTE — ED Notes (Signed)
 Pt belongings inventoried.  Clothes: [employer], [employer] pants, [employer], [employer], [employer], [employer]. Valuables/other: keys, purple phone with upper left side small crack on screen protector, wallet.  Paper saying watch kept by security is inside white bag as well. All belongings except shoes in white bag.

## 2024-02-09 NOTE — ED Provider Notes (Signed)
 [place] EMERGENCY DEPARTMENT AT [facility] Provider Note   CSN: [id number] Arrival date & time: 02/09/24  1116     History  Chief Complaint  Patient presents with   Chest Pain    Robert Chan is a 68 y.o. male.  Pt is a 67y/o male with hx of CAD,GERD, HLD COPD, hypertension, history of NSTEMI, intermittent cocaine use last used 1 week ago, intermittent alcohol use and tobacco use who is presenting today with multiple complaints.  Patient reports that he has been going through a lot recently his sister died 1 week ago without warning.  He reports that hit him really hard and he has been feeling very depressed since that time.  On Monday he was going to overdose on pills but his daughter came over and interrupted him.  He has been feeling suicidal and very down for the last week since her death.  He also reports that he was moving yesterday and he has been having pain in the right side of his back and side since that time which is better if he does not move but with any twisting it worsens.  Also since yesterday he has been having a tightness in his chest with shortness of breath.  He has nasal congestion and cough all the time does report a history of COPD which his inhaler does help with.  He has not had productive cough or fever.  He still reports some mild tightness now and reports it has been fairly persistent but sometimes will come and go.  Not related to exertion or deep breaths.  Patient has been compliant with his medications.  The history is provided by the patient and medical records.  Chest Pain      Home Medications Prior to Admission medications   Medication Sig Start Date End Date Taking? Authorizing Provider  acetaminophen (TYLENOL) 325 MG tablet Take 2 tablets (650 mg total) by mouth every 6 (six) hours as needed for mild pain (pain score 1-3). Patient not taking: Reported on 12/29/2023 11/09/23   Lorri Frederick, MD  albuterol (VENTOLIN HFA) 108  (90 Base) MCG/ACT inhaler Inhale 2 puffs into the lungs every 6 (six) hours as needed for shortness of breath. 12/29/23   Anders Simmonds, PA-C  aspirin EC 81 MG tablet Take 1 tablet (81 mg total) by mouth daily. Swallow whole. 12/21/23   Quintella Reichert, MD  atorvastatin (LIPITOR) 80 MG tablet Take 1 tablet (80 mg total) by mouth daily. 12/21/23 02/09/24  Quintella Reichert, MD  docusate sodium (COLACE) 100 MG capsule Take 1 capsule (100 mg total) by mouth 2 (two) times daily. 12/29/23   Anders Simmonds, PA-C  latanoprost (XALATAN) 0.005 % ophthalmic solution Place 1 drop into both eyes at bedtime. Patient not taking: Reported on 12/29/2023 11/09/23   Lorri Frederick, MD  loratadine (CLARITIN) 10 MG tablet Take 1 tablet (10 mg total) by mouth once for 1 dose. 11/09/23 02/09/24  Carrion-Carrero, Karle Starch, MD  mometasone-formoterol (DULERA) 200-5 MCG/ACT AERO Inhale 2 puffs into the lungs 2 (two) times daily. 12/29/23   Anders Simmonds, PA-C  Multiple Vitamin (MULTIVITAMIN WITH MINERALS) TABS tablet Take 1 tablet by mouth daily. Patient not taking: Reported on 12/29/2023 11/09/23   Lorri Frederick, MD  pantoprazole (PROTONIX) 20 MG tablet Take 1 tablet (20 mg total) by mouth daily. Patient not taking: Reported on 12/29/2023 11/09/23   Lorri Frederick, MD  senna-docusate (SENOKOT-S) 8.6-50 MG tablet Take 1 tablet by mouth at  bedtime as needed for mild constipation. Patient not taking: Reported on 12/29/2023 11/09/23   Lorri Frederick, MD  sertraline (ZOLOFT) 50 MG tablet Take 1 tablet (50 mg total) by mouth daily. 12/29/23   Anders Simmonds, PA-C      Allergies    Shellfish allergy    Review of Systems   Review of Systems  Cardiovascular:  Positive for chest pain.    Physical Exam Updated Vital Signs BP (!) 158/125   Pulse 67   Temp 98.2 F (36.8 C)   Resp 19   Ht 6\' 4"  (1.93 m)   Wt 108.9 kg   SpO2 100%   BMI 29.21 kg/m  Physical Exam Vitals and nursing note  reviewed.  Constitutional:      General: He is not in acute distress.    Appearance: He is well-developed.  HENT:     Head: Normocephalic and atraumatic.  Eyes:     Conjunctiva/sclera: Conjunctivae normal.     Pupils: Pupils are equal, round, and reactive to light.  Cardiovascular:     Rate and Rhythm: Normal rate and regular rhythm.     Pulses: Normal pulses.     Heart sounds: No murmur heard. Pulmonary:     Effort: Pulmonary effort is normal. No respiratory distress.     Breath sounds: Normal breath sounds. No wheezing or rales.    Abdominal:     General: There is no distension.     Palpations: Abdomen is soft.     Tenderness: There is no abdominal tenderness. There is no guarding or rebound.  Musculoskeletal:        General: No tenderness. Normal range of motion.     Cervical back: Normal range of motion and neck supple.     Right lower leg: No edema.     Left lower leg: No edema.  Skin:    General: Skin is warm and dry.     Findings: No erythema or rash.  Neurological:     Mental Status: He is alert and oriented to person, place, and time. Mental status is at baseline.  Psychiatric:        Mood and Affect: Mood is depressed.        Behavior: Behavior normal.        Thought Content: Thought content includes suicidal ideation. Thought content includes suicidal plan.     Comments: Poor eye contact     ED Results / Procedures / Treatments   Labs (all labs ordered are listed, but only abnormal results are displayed) Labs Reviewed  BASIC METABOLIC PANEL WITH GFR - Abnormal; Notable for the following components:      Result Value   CO2 21 (*)    Creatinine, Ser 1.28 (*)    All other components within normal limits  RESP PANEL BY RT-PCR (RSV, FLU A&B, COVID)  RVPGX2  CBC  ETHANOL  RAPID URINE DRUG SCREEN, HOSP PERFORMED  TROPONIN I (HIGH SENSITIVITY)  TROPONIN I (HIGH SENSITIVITY)    EKG EKG Interpretation Date/Time:  Thursday February 09 2024 11:32:37  EDT Ventricular Rate:  79 PR Interval:  180 QRS Duration:  82 QT Interval:  418 QTC Calculation: 479 R Axis:   108  Text Interpretation: Normal sinus rhythm Rightward axis Nonspecific T wave abnormality new  Prolonged QT  When compared with ECG of 07-Nov-2023 16:44, PREVIOUS ECG IS PRESENT   Confirmed by Gwyneth Sprout ([id number]) on 02/09/2024 1:25:35 PM  Radiology DG Chest 2 View Result Date: 02/09/2024 CLINICAL  DATA:  Chest pain. EXAM: CHEST - 2 VIEW COMPARISON:  October 10, 2023. FINDINGS: The heart size and mediastinal contours are within normal limits. Both lungs are clear. The visualized skeletal structures are unremarkable. IMPRESSION: No active cardiopulmonary disease. Electronically Signed   By: Lupita Raider M.D.   On: 02/09/2024 13:51    Procedures Procedures    Medications Ordered in ED Medications  albuterol (VENTOLIN HFA) 108 (90 Base) MCG/ACT inhaler 2 puff (has no administration in time range)  nicotine (NICODERM CQ - dosed in mg/24 hours) patch 21 mg (has no administration in time range)  alum & mag hydroxide-simeth (MAALOX/MYLANTA) 200-200-20 MG/5ML suspension 30 mL (has no administration in time range)  aspirin EC tablet 81 mg (has no administration in time range)  atorvastatin (LIPITOR) tablet 80 mg (has no administration in time range)  loratadine (CLARITIN) tablet 10 mg (has no administration in time range)  pantoprazole (PROTONIX) EC tablet 20 mg (has no administration in time range)  sertraline (ZOLOFT) tablet 50 mg (has no administration in time range)  fluticasone furoate-vilanterol (BREO ELLIPTA) 200-25 MCG/ACT 1 puff (has no administration in time range)  ketorolac (TORADOL) 15 MG/ML injection 15 mg (15 mg Intravenous Given 02/09/24 1501)  methocarbamol (ROBAXIN) tablet 500 mg (500 mg Oral Given 02/09/24 1350)    ED Course/ Medical Decision Making/ A&P                                 Medical Decision Making Amount and/or Complexity of Data  Reviewed External Data Reviewed: notes. Labs: ordered. Decision-making details documented in ED Course. Radiology: ordered and independent interpretation performed. Decision-making details documented in ED Course. ECG/medicine tests: ordered and independent interpretation performed. Decision-making details documented in ED Course.  Risk Prescription drug management.   Pt with multiple medical problems and comorbidities and presenting today with a complaint that caries a high risk for morbidity and mortality.  Here today with multiple symptoms.  Initially patient complaining of right side pain which seems to be musculoskeletal in nature resulting after lifting heavy stuff while attempting to move yesterday.  Also complaining of some tightness in his chest.  He reports the last time he used cocaine was 1 week ago so lower suspicion for cocaine chest pain.  However does have a significant cardiac history of NSTEMI in the past and CAD.  Will rule out ACS.  Patient's symptoms have been fairly persistent since yesterday.  Lower concern for PE, pneumonia, dissection or tamponade.  Also concern for possible COPD exacerbation however patient is not having significant wheezing but does seem congested on exam.  Finally patient is complaining of depression and suicidal ideation and lieu of the death of his sister 1 week ago.  Patient reports he almost overdosed by taking a bunch of pills but his daughter came over and he stopped.  Once patient is medically clear feel that he needs psychiatric evaluation.  I dependently interpreted patient's EKG and labs.  EKG today without acute changes today.  BMP, CBC, troponin are all within normal limits.  I have independently visualized and interpreted pt's images today. Chest x-ray without acute findings. Repeat troponin is negative as well as COVID.  At this time feel that patient is medically clear and can be seen by psychiatry.         Final Clinical Impression(s)  / ED Diagnoses Final diagnoses:  Nonspecific chest pain  Polysubstance use disorder  Depression,  unspecified depression type  Suicidal ideation    Rx / DC Orders ED Discharge Orders     None         Gwyneth Sprout, MD 02/09/24 1609

## 2024-02-09 NOTE — ED Notes (Signed)
 We are out of purple scrubs so I have given pt gown to change into until medically cleared seeing pts c/c

## 2024-02-10 ENCOUNTER — Encounter: Payer: Self-pay | Admitting: Psychiatry

## 2024-02-10 ENCOUNTER — Inpatient Hospital Stay
Admission: AD | Admit: 2024-02-10 | Discharge: 2024-02-23 | DRG: 885 | Disposition: A | Discharge status: Home / Self Care | Source: Intra-hospital | Attending: Psychiatry | Admitting: Psychiatry

## 2024-02-10 DIAGNOSIS — J4489 Other specified chronic obstructive pulmonary disease: Secondary | ICD-10-CM | POA: Diagnosis present

## 2024-02-10 DIAGNOSIS — Z9152 Personal history of nonsuicidal self-harm: Secondary | ICD-10-CM | POA: Diagnosis not present

## 2024-02-10 DIAGNOSIS — Z5982 Transportation insecurity: Secondary | ICD-10-CM | POA: Diagnosis not present

## 2024-02-10 DIAGNOSIS — I252 Old myocardial infarction: Secondary | ICD-10-CM

## 2024-02-10 DIAGNOSIS — Z7951 Long term (current) use of inhaled steroids: Secondary | ICD-10-CM

## 2024-02-10 DIAGNOSIS — Z8249 Family history of ischemic heart disease and other diseases of the circulatory system: Secondary | ICD-10-CM | POA: Diagnosis not present

## 2024-02-10 DIAGNOSIS — Z79899 Other long term (current) drug therapy: Secondary | ICD-10-CM | POA: Diagnosis not present

## 2024-02-10 DIAGNOSIS — E785 Hyperlipidemia, unspecified: Secondary | ICD-10-CM | POA: Diagnosis present

## 2024-02-10 DIAGNOSIS — R45851 Suicidal ideations: Secondary | ICD-10-CM | POA: Diagnosis present

## 2024-02-10 DIAGNOSIS — Z5941 Food insecurity: Secondary | ICD-10-CM

## 2024-02-10 DIAGNOSIS — F1414 Cocaine abuse with cocaine-induced mood disorder: Secondary | ICD-10-CM | POA: Diagnosis present

## 2024-02-10 DIAGNOSIS — N189 Chronic kidney disease, unspecified: Secondary | ICD-10-CM | POA: Diagnosis present

## 2024-02-10 DIAGNOSIS — Z7982 Long term (current) use of aspirin: Secondary | ICD-10-CM

## 2024-02-10 DIAGNOSIS — Z716 Tobacco abuse counseling: Secondary | ICD-10-CM

## 2024-02-10 DIAGNOSIS — I2510 Atherosclerotic heart disease of native coronary artery without angina pectoris: Secondary | ICD-10-CM | POA: Diagnosis present

## 2024-02-10 DIAGNOSIS — F332 Major depressive disorder, recurrent severe without psychotic features: Secondary | ICD-10-CM | POA: Diagnosis present

## 2024-02-10 DIAGNOSIS — F32A Depression, unspecified: Secondary | ICD-10-CM | POA: Diagnosis not present

## 2024-02-10 DIAGNOSIS — K219 Gastro-esophageal reflux disease without esophagitis: Secondary | ICD-10-CM | POA: Diagnosis present

## 2024-02-10 DIAGNOSIS — F17210 Nicotine dependence, cigarettes, uncomplicated: Secondary | ICD-10-CM | POA: Diagnosis present

## 2024-02-10 DIAGNOSIS — Z634 Disappearance and death of family member: Secondary | ICD-10-CM

## 2024-02-10 DIAGNOSIS — Z833 Family history of diabetes mellitus: Secondary | ICD-10-CM

## 2024-02-10 DIAGNOSIS — F1410 Cocaine abuse, uncomplicated: Secondary | ICD-10-CM | POA: Diagnosis present

## 2024-02-10 DIAGNOSIS — I129 Hypertensive chronic kidney disease with stage 1 through stage 4 chronic kidney disease, or unspecified chronic kidney disease: Secondary | ICD-10-CM | POA: Diagnosis present

## 2024-02-10 DIAGNOSIS — F411 Generalized anxiety disorder: Secondary | ICD-10-CM | POA: Diagnosis present

## 2024-02-10 LAB — RAPID URINE DRUG SCREEN, HOSP PERFORMED
Amphetamines: NOT DETECTED
Barbiturates: NOT DETECTED
Benzodiazepines: NOT DETECTED
Cocaine: POSITIVE — AB
Opiates: NOT DETECTED
Tetrahydrocannabinol: NOT DETECTED

## 2024-02-10 LAB — HIV ANTIBODY (ROUTINE TESTING W REFLEX): HIV Screen 4th Generation wRfx: NONREACTIVE

## 2024-02-10 LAB — FOLATE: Folate: 11.3 ng/mL (ref >5.9)

## 2024-02-10 LAB — VITAMIN D 25 HYDROXY (VIT D DEFICIENCY, FRACTURES): Vit D, 25-Hydroxy: 20.96 ng/mL — ABNORMAL LOW (ref 30–100)

## 2024-02-10 LAB — VITAMIN B12: Vitamin B-12: 243 pg/mL (ref 180–914)

## 2024-02-10 LAB — TSH: TSH: 1.25 u[IU]/mL (ref 0.350–4.500)

## 2024-02-10 MED ORDER — VITAMIN B-12 1000 MCG PO TABS
1000.0000 ug | ORAL_TABLET | Freq: Every day | ORAL | Status: DC
  Administered 2024-02-11 – 2024-02-23 (×13): 1000 ug via ORAL
  Filled 2024-02-10 (×13): qty 1

## 2024-02-10 MED ORDER — MAGNESIUM HYDROXIDE 400 MG/5ML PO SUSP: 30.0000 mL | Freq: Every day | ORAL | Status: DC | PRN

## 2024-02-10 MED ORDER — CYANOCOBALAMIN 1000 MCG/ML IJ SOLN
1000.0000 ug | Freq: Once | INTRAMUSCULAR | Status: AC
  Administered 2024-02-10: 1000 ug via SUBCUTANEOUS
  Filled 2024-02-10 (×2): qty 1

## 2024-02-10 MED ORDER — OLANZAPINE 10 MG IM SOLR: 5.0000 mg | Freq: Three times a day (TID) | INTRAMUSCULAR | Status: DC | PRN

## 2024-02-10 MED ORDER — ASPIRIN 81 MG PO TBEC
81.0000 mg | DELAYED_RELEASE_TABLET | Freq: Every day | ORAL | Status: DC
  Administered 2024-02-10 – 2024-02-23 (×14): 81 mg via ORAL
  Filled 2024-02-10 (×14): qty 1

## 2024-02-10 MED ORDER — ATORVASTATIN CALCIUM 20 MG PO TABS
80.0000 mg | ORAL_TABLET | Freq: Every day | ORAL | Status: DC
  Administered 2024-02-10 – 2024-02-23 (×14): 80 mg via ORAL
  Filled 2024-02-10 (×14): qty 4

## 2024-02-10 MED ORDER — HYDROXYZINE HCL 25 MG PO TABS
25.0000 mg | ORAL_TABLET | Freq: Four times a day (QID) | ORAL | Status: AC | PRN
  Administered 2024-02-11: 25 mg via ORAL
  Filled 2024-02-10: qty 1

## 2024-02-10 MED ORDER — ALBUTEROL SULFATE HFA 108 (90 BASE) MCG/ACT IN AERS: 2.0000 | INHALATION_SPRAY | Freq: Four times a day (QID) | RESPIRATORY_TRACT | Status: DC | PRN

## 2024-02-10 MED ORDER — TRAZODONE HCL 50 MG PO TABS
50.0000 mg | ORAL_TABLET | Freq: Every evening | ORAL | Status: DC | PRN
  Administered 2024-02-10 – 2024-02-11 (×2): 50 mg via ORAL
  Filled 2024-02-10 (×2): qty 1

## 2024-02-10 MED ORDER — LORAZEPAM 1 MG PO TABS: 1.0000 mg | ORAL_TABLET | Freq: Four times a day (QID) | ORAL | Status: AC | PRN

## 2024-02-10 MED ORDER — FLUTICASONE FUROATE-VILANTEROL 200-25 MCG/ACT IN AEPB
1.0000 | INHALATION_SPRAY | Freq: Every day | RESPIRATORY_TRACT | Status: DC
  Administered 2024-02-10 – 2024-02-23 (×14): 1 via RESPIRATORY_TRACT
  Filled 2024-02-10: qty 28

## 2024-02-10 MED ORDER — FOLIC ACID 1 MG PO TABS
1.0000 mg | ORAL_TABLET | Freq: Every day | ORAL | Status: DC
  Administered 2024-02-10 – 2024-02-23 (×14): 1 mg via ORAL
  Filled 2024-02-10 (×14): qty 1

## 2024-02-10 MED ORDER — ACETAMINOPHEN 325 MG PO TABS: 650.0000 mg | ORAL_TABLET | Freq: Four times a day (QID) | ORAL | Status: DC | PRN

## 2024-02-10 MED ORDER — ADULT MULTIVITAMIN W/MINERALS CH
1.0000 | ORAL_TABLET | Freq: Every day | ORAL | Status: DC
  Administered 2024-02-10 – 2024-02-23 (×14): 1 via ORAL
  Filled 2024-02-10 (×14): qty 1

## 2024-02-10 MED ORDER — OLANZAPINE 5 MG PO TBDP: 5.0000 mg | ORAL_TABLET | Freq: Three times a day (TID) | ORAL | Status: DC | PRN

## 2024-02-10 MED ORDER — HYDROXYZINE HCL 25 MG PO TABS
25.0000 mg | ORAL_TABLET | Freq: Three times a day (TID) | ORAL | Status: DC | PRN
  Administered 2024-02-16 – 2024-02-22 (×6): 25 mg via ORAL
  Filled 2024-02-10 (×7): qty 1

## 2024-02-10 MED ORDER — SERTRALINE HCL 25 MG PO TABS
50.0000 mg | ORAL_TABLET | Freq: Every day | ORAL | Status: DC
  Administered 2024-02-10 – 2024-02-11 (×2): 50 mg via ORAL
  Filled 2024-02-10 (×2): qty 2

## 2024-02-10 MED ORDER — VITAMIN D 25 MCG (1000 UNIT) PO TABS
1000.0000 [IU] | ORAL_TABLET | Freq: Every day | ORAL | Status: DC
  Administered 2024-02-11 – 2024-02-23 (×13): 1000 [IU] via ORAL
  Filled 2024-02-10 (×13): qty 1

## 2024-02-10 NOTE — Plan of Care (Signed)
   Problem: Education: Goal: Emotional status will improve Outcome: Progressing Goal: Mental status will improve Outcome: Progressing   Problem: Activity: Goal: Sleeping patterns will improve Outcome: Progressing   Problem: Safety: Goal: Periods of time without injury will increase Outcome: Progressing

## 2024-02-10 NOTE — Group Note (Signed)
 Date:  02/10/2024 Time:  11:27 AM  Group Topic/Focus:  Personal Choices and Values:   The focus of this group is to help patients assess and explore the importance of values in their lives, how their values affect their decisions, how they express their values and what opposes their expression.    Participation Level:  Did Not Attend   Mary Sella Melville Engen 02/10/2024, 11:27 AM

## 2024-02-10 NOTE — [facility] Suicide Risk Assessment (Signed)
 Suicide Risk Assessment  Admission Assessment    [facility] Admission Suicide Risk Assessment   Nursing information obtained from:  Patient Demographic factors:  Age 68 or older Current Mental Status:  Suicidal ideation indicated by patient Loss Factors:  Financial problems / change in socioeconomic status Historical Factors:  Prior suicide attempts Risk Reduction Factors:  NA  Total Time spent with patient: 1.5 hours Principal Problem: Cocaine abuse with cocaine-induced mood disorder (HCC) Diagnosis:  Principal Problem:   Cocaine abuse with cocaine-induced mood disorder (HCC) Active Problems:   Cocaine use disorder (HCC)  Subjective Data: 68 y.o. AA male with a past psychiatric history of MDD, alcohol use disorder, and cocaine use disorder, and medical hx of CAD, GERD, HLD COPD, hypertension, and history of NSTEMI initially presented to [facility] ED with chief complaints of chest pain, patient later endorsed suicidal ideations for the last week since his sister passed away, that this past 28-Feb-2024 he was planning to overdose on pills however his daughter showed up to his home and interrupted him.  Patient was medically cleared as workup was deemed to be unremarkable, diagnosis of nonspecific chest pain, administered Toradol IV, Robaxin 500 mg.  Reported use of crack cocaine and alcohol.  BAL less than 10, UDS positive for cocaine, creatinine elevated at 1.28   Continued Clinical Symptoms:  Alcohol Use Disorder Identification Test Final Score (AUDIT): 8 The "Alcohol Use Disorders Identification Test", Guidelines for Use in Primary Care, Second Edition.  World Science writer [facility]). Score between 0-7:  no or low risk or alcohol related problems. Score between 8-15:  moderate risk of alcohol related problems. Score between 16-19:  high risk of alcohol related problems. Score 20 or above:  warrants further diagnostic evaluation for alcohol dependence and treatment.   CLINICAL FACTORS:    Depression:   Anhedonia Comorbid alcohol abuse/dependence Hopelessness Impulsivity Alcohol/Substance Abuse/Dependencies Unstable or Poor Therapeutic Relationship Previous Psychiatric Diagnoses and Treatments Medical Diagnoses and Treatments/Surgeries   Musculoskeletal: Strength & Muscle Tone: within normal limits Gait & Station: normal Patient leans: N/A  Psychiatric Specialty Exam:  Presentation  General Appearance:  Appropriate for Environment  Eye Contact: Poor  Speech: Normal Rate  Speech Volume: Normal  Handedness: -- (not assessed)   Mood and Affect  Mood: Depressed; Hopeless  Affect: Constricted; Congruent   Thought Process  Thought Processes: Linear  Descriptions of Associations:Intact  Orientation:Full (Time, Place and Person)  Thought Content:WDL  History of Schizophrenia/Schizoaffective disorder:No  Duration of Psychotic Symptoms:N/A  Hallucinations:Hallucinations: None  Ideas of Reference:None  Suicidal Thoughts:Suicidal Thoughts: Yes, Active SI Active Intent and/or Plan: Without Intent; Without Plan  Homicidal Thoughts:Homicidal Thoughts: No   Sensorium  Memory: Immediate Fair  Judgment: Poor  Insight: Poor   Executive Functions  Concentration: Poor  Attention Span: Poor  Recall: [employer] of Knowledge: Fair  Language: Fair   Psychomotor Activity  Psychomotor Activity: Psychomotor Activity: Normal   Assets  Assets: Desire for Improvement; Housing; Financial Resources/Insurance   Sleep  Sleep: Sleep: Fair    Physical Exam: Physical Exam Vitals and nursing note reviewed.  HENT:     Head: Normocephalic and atraumatic.  Eyes:     Extraocular Movements: Extraocular movements intact.  Pulmonary:     Effort: Pulmonary effort is normal.  Musculoskeletal:     Cervical back: Neck supple.  Skin:    General: Skin is dry.  Neurological:     Mental Status: He is alert and oriented to person,  place, and time. Mental status  is at baseline.  Psychiatric:        Attention and Perception: Perception normal. He is inattentive.        Mood and Affect: Mood is depressed.        Speech: Speech normal.        Behavior: Behavior is cooperative.        Thought Content: Thought content normal.        Cognition and Memory: Memory normal.        Judgment: Judgment is inappropriate.      Review of Systems  Psychiatric/Behavioral:  Positive for depression, substance abuse and suicidal ideas.   All other systems reviewed and are negative.  Blood pressure (!) 131/90, pulse 75, temperature (!) 97.5 F (36.4 C), temperature source Temporal, resp. rate 18, height 6\' 4"  (1.93 m), weight 111.1 kg, SpO2 97%. Body mass index is 29.82 kg/m.   COGNITIVE FEATURES THAT CONTRIBUTE TO RISK:  None    SUICIDE RISK:   Moderate:  Frequent suicidal ideation with limited intensity, and duration, some specificity in terms of plans, no associated intent, good self-control, limited dysphoria/symptomatology, some risk factors present, and identifiable protective factors, including available and accessible social support.  PLAN OF CARE:  Crisis stabilization, psychiatric evaluation, therapeutic milieu, group participation, medication management for depression, suicidal ideation, substance abuse, and development of an individualized safety plan.  I certify that inpatient services furnished can reasonably be expected to improve the patient's condition.   Taimane Stimmel, PA-C 02/10/2024, 1:25 PM

## 2024-02-10 NOTE — Group Note (Signed)
 Recreation Therapy Group Note   Group Topic:Stress Management  Group Date: 02/10/2024 Start Time: 1515 End Time: 1610 Facilitators: Rosina Lowenstein, LRT, CTRS Location:  Craft Room  Group Description: Scattergories. LRT and pts played rounds of the game Scattergories while divided up in three groups. Scattergories requires you to think and write fast, communicate clearly, and work together all while under a time restraint. LRT and peers discussed the importance of working well with others, communicating effectively, and being a part of a team. LRT and pts discussed how this can be used post discharge.   Goal Areas Addressed:  Patient will engage in competitive recreational game with peers. Patient will increase communication skills.  Patient will practice being a part of a team. Patient will identify physical signs of stress. Patient will identify mental signs of stress. Patient will gain frustration tolerance skills.    Affect/Mood: N/A   Participation Level: Did not attend    Clinical Observations/Individualized Feedback: Patient did not attend group.   Plan: Continue to engage patient in RT group sessions 2-3x/week.   Rosina Lowenstein, LRT, CTRS 02/10/2024 4:54 PM

## 2024-02-10 NOTE — Progress Notes (Deleted)
   02/10/24 0900  Psych Admission Type (Psych Patients Only)  Admission Status Voluntary  Psychosocial Assessment  Patient Complaints None  Eye Contact Fair  Facial Expression Flat  Affect Flat  Speech Logical/coherent;Soft  Interaction Minimal  Motor Activity Slow  Appearance/Hygiene Unremarkable  Behavior Characteristics Cooperative  Mood Depressed  Thought Process  Coherency WDL  Content WDL  Delusions None reported or observed  Perception WDL  Hallucination None reported or observed  Judgment WDL  Confusion None  Danger to Self  Current suicidal ideation? Denies  Danger to Others  Danger to Others None reported or observed

## 2024-02-10 NOTE — Group Note (Signed)
 Date:  02/10/2024 Time:  9:27 PM  Group Topic/Focus:  Wrap-Up Group:   The focus of this group is to help patients review their daily goal of treatment and discuss progress on daily workbooks.    Participation Level:  Did Not Attend  Participation Quality:   none  Affect:   none  Cognitive:   none  Insight: None  Engagement in Group:   none  Modes of Intervention:   none  Additional Comments:  none   Belva Crome 02/10/2024, 9:27 PM

## 2024-02-10 NOTE — Progress Notes (Signed)
   02/10/24 0900  Psych Admission Type (Psych Patients Only)  Admission Status Voluntary  Psychosocial Assessment  Patient Complaints None  Eye Contact Brief  Facial Expression Sad  Affect Flat  Speech Logical/coherent;Slow  Interaction Minimal  Motor Activity Slow  Appearance/Hygiene Unremarkable  Behavior Characteristics Cooperative  Mood Depressed;Sad  Thought Process  Coherency WDL  Content WDL  Delusions None reported or observed  Perception WDL  Hallucination None reported or observed  Judgment Impaired  Confusion None  Danger to Self  Current suicidal ideation? Passive  Description of Suicide Plan Patient reports passive thoughts  Self-Injurious Behavior No self-injurious ideation or behavior indicators observed or expressed   Agreement Not to Harm Self Yes  Description of Agreement Verbal  Danger to Others  Danger to Others None reported or observed
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
## 2024-02-10 NOTE — Group Note (Signed)
 Recreation Therapy Group Note   Group Topic:Leisure Education  Group Date: 02/10/2024 Start Time: 1050 End Time: 1150 Facilitators: Rosina Lowenstein, LRT, CTRS Location:  Craft Room  Group Description: Leisure. Patients were given the option to choose from singing karaoke, coloring mandalas, using oil pastels, journaling, or playing with play-doh. LRT and pts discussed the meaning of leisure, the importance of participating in leisure during their free time/when they're outside of the hospital, as well as how our leisure interests can also serve as coping skills.   Goal Area(s) Addressed:  Patient will identify a current leisure interest.  Patient will learn the definition of "leisure". Patient will practice making a positive decision. Patient will have the opportunity to try a new leisure activity. Patient will communicate with peers and LRT.    Affect/Mood: N/A   Participation Level: Did not attend    Clinical Observations/Individualized Feedback: Patient did not attend group.   Plan: Continue to engage patient in RT group sessions 2-3x/week.   Rosina Lowenstein, LRT, CTRS 02/10/2024 12:58 PM
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
## 2024-02-10 NOTE — BH IP Treatment Plan (Signed)
 Interdisciplinary Treatment and Diagnostic Plan Update  02/10/2024 Time of Session: 10:23 [facility] [place] MRN: [id number]  Principal Diagnosis: Cocaine abuse with cocaine-induced mood disorder (HCC)  Secondary Diagnoses: Principal Problem:   Cocaine abuse with cocaine-induced mood disorder (HCC) Active Problems:   Cocaine use disorder (HCC)   Current Medications:  Current Facility-Administered Medications  Medication Dose Route Frequency Provider Last Rate Last Admin   acetaminophen (TYLENOL) tablet 650 mg  650 mg Oral Q6H PRN Tingling, Stephanie, PA-C       albuterol (VENTOLIN HFA) 108 (90 Base) MCG/ACT inhaler 2 puff  2 puff Inhalation Q6H PRN Tingling, Stephanie, PA-C       aspirin EC tablet 81 mg  81 mg Oral Daily Tingling, Stephanie, PA-C       atorvastatin (LIPITOR) tablet 80 mg  80 mg Oral Daily Tingling, Stephanie, PA-C       fluticasone furoate-vilanterol (BREO ELLIPTA) 200-25 MCG/ACT 1 puff  1 puff Inhalation Daily Tingling, Stephanie, PA-C       hydrOXYzine (ATARAX) tablet 25 mg  25 mg Oral TID PRN Tingling, Stephanie, PA-C       hydrOXYzine (ATARAX) tablet 25 mg  25 mg Oral Q6H PRN Tingling, Stephanie, PA-C       LORazepam (ATIVAN) tablet 1 mg  1 mg Oral Q6H PRN Tingling, Stephanie, PA-C       magnesium hydroxide (MILK OF MAGNESIA) suspension 30 mL  30 mL Oral Daily PRN Tingling, Stephanie, PA-C       multivitamin with minerals tablet 1 tablet  1 tablet Oral Daily Tingling, Stephanie, PA-C   1 tablet at 02/10/24 0903   OLANZapine (ZYPREXA) injection 5 mg  5 mg Intramuscular TID PRN Tingling, Stephanie, PA-C       OLANZapine zydis (ZYPREXA) disintegrating tablet 5 mg  5 mg Oral TID PRN Tingling, Stephanie, PA-C       sertraline (ZOLOFT) tablet 50 mg  50 mg Oral Daily Tingling, Stephanie, PA-C       traZODone (DESYREL) tablet 50 mg  50 mg Oral QHS PRN Tingling, Stephanie, PA-C       PTA Medications: Medications Prior to Admission  Medication Sig Dispense Refill Last  Dose/Taking   acetaminophen (TYLENOL) 325 MG tablet Take 2 tablets (650 mg total) by mouth every 6 (six) hours as needed for mild pain (pain score 1-3).      albuterol (VENTOLIN HFA) 108 (90 Base) MCG/ACT inhaler Inhale 2 puffs into the lungs every 6 (six) hours as needed for shortness of breath. 18 g 1    aspirin EC 81 MG tablet Take 1 tablet (81 mg total) by mouth daily. Swallow whole.      atorvastatin (LIPITOR) 80 MG tablet Take 1 tablet (80 mg total) by mouth daily. 90 tablet 3    docusate sodium (COLACE) 100 MG capsule Take 1 capsule (100 mg total) by mouth 2 (two) times daily. (Patient taking differently: Take 100 mg by mouth daily as needed for mild constipation.) 60 capsule 3    latanoprost (XALATAN) 0.005 % ophthalmic solution Place 1 drop into both eyes at bedtime. (Patient not taking: Reported on 12/21/2023)      loratadine (CLARITIN) 10 MG tablet Take 1 tablet (10 mg total) by mouth once for 1 dose. (Patient taking differently: Take 10 mg by mouth daily as needed for allergies.)      mometasone-formoterol (DULERA) 200-5 MCG/ACT AERO Inhale 2 puffs into the lungs 2 (two) times daily. 13 g 3    senna-docusate (SENOKOT-S) 8.6-50 MG  tablet Take 1 tablet by mouth at bedtime as needed for mild constipation. 30 tablet 0    sertraline (ZOLOFT) 50 MG tablet Take 1 tablet (50 mg total) by mouth daily. 30 tablet 3     Patient Stressors:    Patient Strengths:    Treatment Modalities: Medication Management, Group therapy, Case management,  1 to 1 session with clinician, Psychoeducation, Recreational therapy.   Physician Treatment Plan for Primary Diagnosis: Cocaine abuse with cocaine-induced mood disorder (HCC) Long Term Goal(s):     Short Term Goals:    Medication Management: Evaluate patient's response, side effects, and tolerance of medication regimen.  Therapeutic Interventions: 1 to 1 sessions, Unit Group sessions and Medication administration.  Evaluation of Outcomes: Not  Met  Physician Treatment Plan for Secondary Diagnosis: Principal Problem:   Cocaine abuse with cocaine-induced mood disorder (HCC) Active Problems:   Cocaine use disorder (HCC)  Long Term Goal(s):     Short Term Goals:       Medication Management: Evaluate patient's response, side effects, and tolerance of medication regimen.  Therapeutic Interventions: 1 to 1 sessions, Unit Group sessions and Medication administration.  Evaluation of Outcomes: Not Met   RN Treatment Plan for Primary Diagnosis: Cocaine abuse with cocaine-induced mood disorder (HCC) Long Term Goal(s): Knowledge of disease and therapeutic regimen to maintain health will improve  Short Term Goals: Ability to remain free from injury will improve, Ability to verbalize frustration and anger appropriately will improve, Ability to demonstrate self-control, Ability to participate in decision making will improve, Ability to verbalize feelings will improve, Ability to disclose and discuss suicidal ideas, Ability to identify and develop effective coping behaviors will improve, and Compliance with prescribed medications will improve  Medication Management: RN will administer medications as ordered by provider, will assess and evaluate patient's response and provide education to patient for prescribed medication. RN will report any adverse and/or side effects to prescribing provider.  Therapeutic Interventions: 1 on 1 counseling sessions, Psychoeducation, Medication administration, Evaluate responses to treatment, Monitor vital signs and CBGs as ordered, Perform/monitor CIWA, COWS, AIMS and Fall Risk screenings as ordered, Perform wound care treatments as ordered.  Evaluation of Outcomes: Progressing   LCSW Treatment Plan for Primary Diagnosis: Cocaine abuse with cocaine-induced mood disorder (HCC) Long Term Goal(s): Safe transition to appropriate next level of care at discharge, Engage patient in therapeutic group addressing  interpersonal concerns.  Short Term Goals: Engage patient in aftercare planning with referrals and resources, Increase social support, Increase ability to appropriately verbalize feelings, Increase emotional regulation, Facilitate acceptance of mental health diagnosis and concerns, Facilitate patient progression through stages of change regarding substance use diagnoses and concerns, Identify triggers associated with mental health/substance abuse issues, and Increase skills for wellness and recovery  Therapeutic Interventions: Assess for all discharge needs, 1 to 1 time with Social worker, Explore available resources and support systems, Assess for adequacy in community support network, Educate family and significant other(s) on suicide prevention, Complete Psychosocial Assessment, Interpersonal group therapy.  Evaluation of Outcomes: Not Met   Progress in Treatment: Attending groups: No. Participating in groups: No. Taking medication as prescribed: Yes. Toleration medication: Yes. Family/Significant other contact made: No, will contact:  when given permission. Patient understands diagnosis: Yes. Discussing patient identified problems/goals with staff: Yes. Medical problems stabilized or resolved: Yes. Denies suicidal/homicidal ideation: No. Issues/concerns per patient self-inventory: No. Other: none.   New problem(s) identified: No, Describe:  none identified.  New Short Term/Long Term Goal(s): detox, medication management for mood stabilization;  elimination of SI thoughts; development of comprehensive mental wellness/sobriety plan.  Patient Goals: "To get better."    Discharge Plan or Barriers: CSW will assist pt with development of an appropriate aftercare/discharge plan.   Reason for Continuation of Hospitalization: Depression Medication stabilization Suicidal ideation Withdrawal symptoms  Estimated Length of Stay: 1-7 days  Last 3 [place] Suicide Severity Risk  Score: Flowsheet Row Admission (Current) from 02/10/2024 in [facility] INPAT[facility]ICINE ED from 02/09/2024 in [facility] Emergency Department [facility] ED from [facility]/07/2023 in [facility]  C-SSRS RISK CATEGORY Low Risk No Risk No Risk       Last PHQ 2/9 Scores:    12/29/2023    4:08 PM 11/09/2023    8:38 AM 09/21/2023    2:39 PM  Depression screen PHQ 2/9  Decreased Interest 2 1 1   Down, Depressed, Hopeless 1 2 0  PHQ - 2 Score 3 3 1   Altered sleeping 0 1   Tired, decreased energy 1 2   Change in appetite 0 1   Feeling bad or failure about yourself  1 1   Trouble concentrating 1 0   Moving slowly or fidgety/restless 1 2   Suicidal thoughts 0 1   PHQ-9 Score 7 11     Scribe for Treatment Team: Glenis Smoker, LCSW 02/10/2024 11:23 AM
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
## 2024-02-11 DIAGNOSIS — F1414 Cocaine abuse with cocaine-induced mood disorder: Secondary | ICD-10-CM | POA: Diagnosis not present

## 2024-02-11 LAB — RPR: RPR Ser Ql: NONREACTIVE

## 2024-02-11 MED ORDER — SERTRALINE HCL 25 MG PO TABS
50.0000 mg | ORAL_TABLET | Freq: Once | ORAL | Status: AC
  Administered 2024-02-11: 50 mg via ORAL
  Filled 2024-02-11: qty 2

## 2024-02-11 MED ORDER — SERTRALINE HCL 25 MG PO TABS
150.0000 mg | ORAL_TABLET | Freq: Every day | ORAL | Status: DC
  Administered 2024-02-12: 150 mg via ORAL
  Filled 2024-02-11: qty 2

## 2024-02-11 MED ORDER — SERTRALINE HCL 100 MG PO TABS: 100.0000 mg | ORAL_TABLET | Freq: Every day | ORAL | Status: DC

## 2024-02-11 NOTE — Progress Notes (Signed)
   02/10/24 2000  Psych Admission Type (Psych Patients Only)  Admission Status Voluntary  Psychosocial Assessment  Patient Complaints None  Eye Contact Fair;Brief  Facial Expression Sad  Affect Flat  Speech Logical/coherent  Interaction Minimal  Motor Activity Slow  Appearance/Hygiene Unremarkable  Behavior Characteristics Cooperative;Calm  Mood Depressed  Thought Process  Coherency WDL  Content WDL  Delusions None reported or observed  Perception WDL  Hallucination None reported or observed  Judgment Limited  Confusion Severe  Danger to Self  Current suicidal ideation? Denies   Patient denies SI/HI/AVH interacting appropriately with peers and saff, no distress, his thoughts are organized and coherent. Will continue to monitor closely.
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
## 2024-02-11 NOTE — [facility] Suicide Risk Assessment (Signed)
 [facility] INPATIENT:  Family/Significant Other Suicide Prevention Education  Suicide Prevention Education:  Contact Attempts: Alvan Jews, daughter, [phone]  has been identified by the patient as the family member/significant other with whom the patient will be residing, and identified as the person(s) who will aid the patient in the event of a mental health crisis.  With written consent from the patient, two attempts were made to provide suicide prevention education, prior to and/or following the patient's discharge.  We were unsuccessful in providing suicide prevention education.  A suicide education pamphlet was given to the patient to share with family/significant other.  Date and time of first attempt: 02/11/2024 at 4:15 pm Date and time of second attempt: second attempt needed  Santina Cull 02/11/2024, 4:15 PM

## 2024-02-11 NOTE — Plan of Care (Signed)
   Problem: Activity: Goal: Interest or engagement in activities will improve Outcome: Progressing   Problem: Health Behavior/Discharge Planning: Goal: Compliance with treatment plan for underlying cause of condition will improve Outcome: Progressing   Problem: Safety: Goal: Periods of time without injury will increase Outcome: Progressing

## 2024-02-11 NOTE — Progress Notes (Signed)
   02/11/24 0846  Psych Admission Type (Psych Patients Only)  Admission Status Voluntary  Psychosocial Assessment  Patient Complaints None  Eye Contact Fair  Facial Expression Flat  Affect Appropriate to circumstance  Speech Logical/coherent  Interaction Minimal  Motor Activity Slow  Appearance/Hygiene Unremarkable  Behavior Characteristics Cooperative;Calm  Mood Depressed  Thought Process  Coherency WDL  Content WDL  Delusions None reported or observed  Perception WDL  Hallucination None reported or observed  Judgment Limited  Confusion None  Danger to Self  Current suicidal ideation? Passive  Self-Injurious Behavior No self-injurious ideation or behavior indicators observed or expressed   Agreement Not to Harm Self No  Description of Agreement verbal  Danger to Others  Danger to Others None reported or observed

## 2024-02-12 DIAGNOSIS — F1414 Cocaine abuse with cocaine-induced mood disorder: Secondary | ICD-10-CM | POA: Diagnosis not present

## 2024-02-12 MED ORDER — SERTRALINE HCL 100 MG PO TABS
200.0000 mg | ORAL_TABLET | Freq: Every day | ORAL | Status: DC
  Administered 2024-02-13 – 2024-02-23 (×11): 200 mg via ORAL
  Filled 2024-02-12 (×11): qty 2

## 2024-02-12 MED ORDER — MIRTAZAPINE 15 MG PO TABS
7.5000 mg | ORAL_TABLET | Freq: Every day | ORAL | Status: DC
  Administered 2024-02-12 – 2024-02-19 (×8): 7.5 mg via ORAL
  Filled 2024-02-12 (×9): qty 1

## 2024-02-12 NOTE — Progress Notes (Signed)
 Patient presents in animated mood this morning and stated he slept good last night and reports a fair appetite. Patient states his depression and anxiety are an 8 out of 10 with passive SI but denies HI and A/V/H with no plan or intent. Patients goal for today is "getting well by "doing everything I need to do." Patient remains cooperative in unit with no s/s of current distress.

## 2024-02-12 NOTE — Group Note (Signed)
 Date:  02/12/2024 Time:  8:32 PM  Group Topic/Focus:  Wrap-Up Group:   The focus of this group is to help patients review their daily goal of treatment and discuss progress on daily workbooks.    Participation Level:  Active  Participation Quality:  Appropriate and Attentive  Affect:  Appropriate  Cognitive:  Alert and Appropriate  Insight: Appropriate and Good  Engagement in Group:  Engaged and Improving  Modes of Intervention:  Discussion, Rapport Building, and Support  Additional Comments:     Sallyanne Birkhead 02/12/2024, 8:32 PM

## 2024-02-12 NOTE — Plan of Care (Signed)
  Problem: Education: Goal: Mental status will improve Outcome: Progressing   Problem: Health Behavior/Discharge Planning: Goal: Compliance with treatment plan for underlying cause of condition will improve Outcome: Progressing   Problem: Safety: Goal: Periods of time without injury will increase Outcome: Progressing   

## 2024-02-12 NOTE — Plan of Care (Signed)
   Problem: Education: Goal: Emotional status will improve Outcome: Progressing   Problem: Education: Goal: Mental status will improve Outcome: Progressing
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
## 2024-02-12 NOTE — Progress Notes (Signed)
   02/11/24 2000  Psych Admission Type (Psych Patients Only)  Admission Status Voluntary  Psychosocial Assessment  Eye Contact Fair;Brief  Facial Expression Sad  Affect Flat  Speech Logical/coherent  Interaction Minimal  Motor Activity Slow  Appearance/Hygiene Unremarkable  Behavior Characteristics Cooperative;Appropriate to situation;Calm  Mood Depressed  Thought Process  Coherency WDL  Content WDL  Delusions None reported or observed  Perception WDL  Hallucination None reported or observed  Judgment Limited  Confusion Severe  Danger to Self  Current suicidal ideation? Denies  Self-Injurious Behavior No self-injurious ideation or behavior indicators observed or expressed   Agreement Not to Harm Self No  Description of Agreement verbal  Danger to Others  Danger to Others None reported or observed   Patient alert and oriented x 4, thoughts are organized and coherent, he denies SI/HI/AVH.

## 2024-02-12 NOTE — [facility] Suicide Risk Assessment (Signed)
 [facility] INPATIENT:  Family/Significant Other Suicide Prevention Education  Suicide Prevention Education:  Contact Attempts: Alvan Jews, daughter, [phone], has been identified by the patient as the family member/significant other with whom the patient will be residing, and identified as the person(s) who will aid the patient in the event of a mental health crisis.  With written consent from the patient, two attempts were made to provide suicide prevention education, prior to and/or following the patient's discharge.  We were unsuccessful in providing suicide prevention education.  A suicide education pamphlet was given to the patient to share with family/significant other.  Date and time of first attempt: 02/11/2024 at 4:15 pm  Date and time of second attempt:  02/12/2024 at 1:24 pm  Santina Cull 02/12/2024, 1:23 PM

## 2024-02-12 NOTE — [facility] Suicide Risk Assessment (Signed)
 [facility] INPATIENT:  Family/Significant Other Suicide Prevention Education  Suicide Prevention Education:  Education Completed; Alvan Jews, daughter, [phone],  has been identified by the patient as the family member/significant other with whom the patient will be residing, and identified as the person(s) who will aid the patient in the event of a mental health crisis (suicidal ideations/suicide attempt).  With written consent from the patient, the family member/significant other has been provided the following suicide prevention education, prior to the and/or following the discharge of the patient.  The suicide prevention education provided includes the following: Suicide risk factors Suicide prevention and interventions National Suicide Hotline telephone number [facility] assessment telephone number [facility] Emergency Assistance 911 [facility] and/or Residential Mobile Crisis Unit telephone number  Request made of family/significant other to: Remove weapons (e.g., guns, rifles, knives), all items previously/currently identified as safety concern.   Remove drugs/medications (over-the-counter, prescriptions, illicit drugs), all items previously/currently identified as a safety concern.  The family member/significant other verbalizes understanding of the suicide prevention education information provided.  The family member/significant other agrees to remove the items of safety concern listed above.  The LCSWA contacted the patient daughter to provide SPE. The patient daughter stated that she has been looking at the patient MyChart and she is glad that he is here. She stated that he has no access to guns or weapons. She reported that she will be picking up the patient and he will live with her. The daughter reported that he has a voucher for elderly section 8 and he can stay with her until he is able to move. He only concern was about the patient medication. She stated  that when he leaves he usually don't pick them up.    Santina Cull 02/12/2024, 1:30 PM

## 2024-02-13 ENCOUNTER — Ambulatory Visit: Payer: 59 | Admitting: Family Medicine

## 2024-02-13 NOTE — Progress Notes (Incomplete)
 [facility] MD Progress Note  02/13/2024 9:21 PM Robert Chan  MRN:  [id number] Subjective:  *** Principal Problem: Cocaine abuse with cocaine-induced mood disorder (HCC) Diagnosis: Principal Problem:   Cocaine abuse with cocaine-induced mood disorder (HCC) Active Problems:   Cocaine use disorder (HCC) 68 y.o. AA male with a past psychiatric history of MDD, alcohol use disorder, and cocaine use disorder, and medical hx of CAD, GERD, HLD COPD, hypertension, and history of NSTEMI initially presented to [facility] ED with chief complaints of chest pain, patient later endorsed suicidal ideations for the last week since his sister passed away, that this past 03/05/24 he was planning to overdose on pills however his daughter showed up to his home and interrupted him.  Patient was medically cleared as workup was deemed to be unremarkable, diagnosis of nonspecific chest pain, administered Toradol IV, Robaxin 500 mg.  Reported use of crack cocaine and alcohol.  BAL less than 10, UDS positive for cocaine, creatinine elevated at 1.28   Patient is seen today for evaluation and during treatment team.  Affect is constricted, minimally engaged during conversation.  Eye contact is poor, seems to be somewhat evasive/guarded.  States that he has been experiencing increased suicidal ideation over the last week since the passing of his sister, but that he has always struggled with significant depression and anxiety.  Also endorsing racing thoughts, excessive worries, worthlessness, hopelessness, difficulties concentrating, reduced appetite and continued suicidal thoughts without plan.  Appears to be somewhat restless.  States that he has lost multiple brothers to alcohol and drugs, does not have any social support.  His daughter does come to visit him at times, however he does not feel this is enough.  Lives at home alone.  He is unable to state when was the last time he was on medications for depression, does not follow up with  outpatient mental health provider or therapist.  Unable to state why he has discontinued taking antidepressants.  Denies HI and AVH. Total Time spent with patient: {Time; 15 min - 8 hours:17441}  Past Psychiatric History: ***  Past Medical History:  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   Depression    Elevated serum creatinine 08/28/2019   Homelessness 06/19/2020   Homicidal ideations 05/25/2023   Hypertension    Major depressive disorder, recurrent episode, 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/11/2022   NSTEMI (non-ST elevated myocardial infarction) (HCC)    Schizoaffective disorder (HCC) 11/08/2022   Status post incision and drainage 04/22/2021   Suicidal ideation 06/05/2020    Past Surgical History:  Procedure Laterality Date   APPENDECTOMY     EYE SURGERY     LEFT HEART CATH AND CORONARY ANGIOGRAPHY N/A 06/05/2020   Procedure: LEFT HEART CATH AND CORONARY ANGIOGRAPHY;  Surgeon: Elder Negus, MD;  Location: [facility];  Service: Cardiovascular;  Laterality: N/A;   PROSTATE SURGERY     Family History:  Family History  Problem Relation Age of Onset   Hypertension Mother    Diabetes Mother    Hypertension Father    Hypertension Sister    Family Psychiatric  History: *** Social History:  Social History   Substance and Sexual Activity  Alcohol Use Yes   Comment: 1 40oz beer about every other day     Social History   Substance and Sexual Activity  Drug Use Yes   Types: Cocaine   Comment: Cocaine about once or twice a month; last use one week ago    Social History   Socioeconomic History   Marital status: Single    Spouse name: Not on file   Number of children: Not on file   Years of  education: Not on file   Highest education level: Not on file  Occupational History   Not on file  Tobacco Use   Smoking status: Some Days    Current packs/day: 0.00    Types: Cigarettes    Last attempt to quit: 12/25/2020    Years since quitting: 3.1   Smokeless tobacco: Never   Tobacco comments:    Smokes a couple cigarettes every now and then  Vaping Use   Vaping status: Never Used  Substance and Sexual Activity   Alcohol use: Yes    Comment: 1 40oz beer about every other day   Drug use: Yes    Types: Cocaine    Comment: Cocaine about once or twice a month; last use one week ago   Sexual activity: Not Currently  Other Topics Concern   Not on file  Social History Narrative   His mother passed at age 5 (when he was 36 years old) and his father raised him and his siblings   His parents were pastors as a lot of his sisters and other family members are today   There was a total of 13 children in his family Had 24 sisters, one sister deceased , Had 3 brothers, all brothers deceased   He is the youngest of the 83 and was "always in trouble in my younger years"   Family still live in [place], [place], some in [place], [place]   Has a Daughter and son with a total of 10 grandchildren (6 grand daughters local and 4 grandsons)    Values family   Married twice, present wife incarcerated   Social Drivers of Corporate investment banker Strain: Low Risk  (10/19/2022)   Overall Financial Resource Strain (CARDIA)    Difficulty of Paying Living Expenses: Not hard at all  Food Insecurity: Food Insecurity Present (02/10/2024)   Hunger Vital Sign    Worried About Running Out of Food in the Last Year: Sometimes true    Ran Out of Food in the Last Year: Sometimes true  Transportation Needs: Unmet Transportation Needs (02/10/2024)   PRAPARE - Transportation    Lack of Transportation (Medical): Yes    Lack of Transportation (Non-Medical): Yes  Physical Activity: Inactive (10/19/2022)    Exercise Vital Sign    Days of Exercise per Week: 0 days    Minutes of Exercise per Session: 0 min  Stress: No Stress Concern Present (10/19/2022)   [name] of Occupational Health - Occupational Stress Questionnaire    Feeling of Stress : Not at all  Social Connections: Unknown (02/10/2024)   Social Connection and Isolation Panel [NHANES]    Frequency of Communication with Friends and Family: Twice a week    Frequency of Social Gatherings with Friends and Family: Patient declined    Attends Religious Services: Never    Database administrator or Organizations: No    Attends Banker Meetings: Never    Marital Status: Patient declined   Additional Social History:  Sleep: {BHH GOOD/FAIR/POOR:22877}  Appetite:  {BHH GOOD/FAIR/POOR:22877}  Current Medications: Current Facility-Administered Medications  Medication Dose Route Frequency Provider Last Rate Last Admin   acetaminophen (TYLENOL) tablet 650 mg  650 mg Oral Q6H PRN Tingling, Stephanie, PA-C       albuterol (VENTOLIN HFA) 108 (90 Base) MCG/ACT inhaler 2 puff  2 puff Inhalation Q6H PRN Tingling, Stephanie, PA-C       aspirin EC tablet 81 mg  81 mg Oral Daily Tingling, Stephanie, PA-C   81 mg at 02/13/24 0823   atorvastatin (LIPITOR) tablet 80 mg  80 mg Oral Daily Tingling, Stephanie, PA-C   80 mg at 02/13/24 1610   cholecalciferol (VITAMIN D3) 25 MCG (1000 UNIT) tablet 1,000 Units  1,000 Units Oral Daily Tingling, Stephanie, PA-C   1,000 Units at 02/13/24 9604   cyanocobalamin (VITAMIN B12) tablet 1,000 mcg  1,000 mcg Oral Daily Tingling, Stephanie, PA-C   1,000 mcg at 02/13/24 0823   fluticasone furoate-vilanterol (BREO ELLIPTA) 200-25 MCG/ACT 1 puff  1 puff Inhalation Daily Tingling, Stephanie, PA-C   1 puff at 02/13/24 5409   folic acid (FOLVITE) tablet 1 mg  1 mg Oral Daily Tingling, Stephanie, PA-C   1 mg at 02/13/24 8119   hydrOXYzine (ATARAX) tablet 25 mg  25 mg Oral TID  PRN Tingling, Stephanie, PA-C       magnesium hydroxide (MILK OF MAGNESIA) suspension 30 mL  30 mL Oral Daily PRN Tingling, Stephanie, PA-C       mirtazapine (REMERON) tablet 7.5 mg  7.5 mg Oral QHS Tingling, Stephanie, PA-C   7.5 mg at 02/13/24 2106   multivitamin with minerals tablet 1 tablet  1 tablet Oral Daily Tingling, Stephanie, PA-C   1 tablet at 02/13/24 0823   OLANZapine (ZYPREXA) injection 5 mg  5 mg Intramuscular TID PRN Tingling, Stephanie, PA-C       OLANZapine zydis (ZYPREXA) disintegrating tablet 5 mg  5 mg Oral TID PRN Tingling, Stephanie, PA-C       sertraline (ZOLOFT) tablet 200 mg  200 mg Oral Daily Tingling, Stephanie, PA-C   200 mg at 02/13/24 1478    Lab Results: No results found for this or any previous visit (from the past 48 hours).  Blood Alcohol level:  Lab Results  Component Value Date   ETH <10 02/09/2024   ETH <10 10/10/2023    Metabolic Disorder Labs: Lab Results  Component Value Date   HGBA1C 5.1 09/15/2023   MPG 99.67 09/15/2023   MPG 103 01/13/2023   No results found for: "PROLACTIN" Lab Results  Component Value Date   CHOL 159 09/15/2023   TRIG 53 09/15/2023   HDL 61 09/15/2023   CHOLHDL 2.6 09/15/2023   VLDL 11 09/15/2023   LDLCALC 87 09/15/2023   LDLCALC 99 01/13/2023    Physical Findings: AIMS:  , ,  ,  ,    CIWA:  CIWA-Ar Total: 0 COWS:     Musculoskeletal: Strength & Muscle Tone: {desc; muscle tone:32375} Gait & Station: {PE GAIT ED NATL:22525} Patient leans: {Patient Leans:21022755}  Psychiatric Specialty Exam:  Presentation  General Appearance:  Appropriate for Environment  Eye Contact: Fair  Speech: Normal Rate  Speech Volume: Normal  Handedness: Right   Mood and Affect  Mood: Depressed; Anxious  Affect: Congruent   Thought Process  Thought Processes: Coherent  Descriptions of Associations:Intact  Orientation:Full (Time, Place and Person)  Thought Content:WDL  History of  Schizophrenia/Schizoaffective disorder:No  Duration of Psychotic Symptoms:N/A  Hallucinations:Hallucinations: None  Ideas of Reference:None  Suicidal Thoughts:Suicidal Thoughts: No  Homicidal Thoughts:Homicidal Thoughts: No   Sensorium  Memory: Immediate Fair; Recent Fair; Remote Fair  Judgment: Fair  Insight: Fair   Art therapist  Concentration: Poor  Attention Span: Fair  Recall: [employer] of Knowledge: Fair  Language: Fair   Psychomotor Activity  Psychomotor Activity:No data recorded  Assets  Assets: Communication Skills; Desire for Improvement   Sleep  Sleep: Sleep: Fair Number of Hours of Sleep: 6    Physical Exam: Physical Exam ROS Blood pressure 138/79, pulse 76, temperature (!) 97.5 F (36.4 C), resp. rate 16, height 6\' 4"  (1.93 m), weight 111.1 kg, SpO2 100%. Body mass index is 29.82 kg/m.   Treatment Plan Summary: {CHL [facility] MD [place]. ZOXW:960454098}  Elston Halsted, NP 02/13/2024, 9:21 PM

## 2024-02-13 NOTE — Progress Notes (Signed)
   02/13/24 0955  Psych Admission Type (Psych Patients Only)  Admission Status Voluntary  Psychosocial Assessment  Patient Complaints None  Eye Contact Fair  Facial Expression Blank  Affect Flat  Speech Logical/coherent  Interaction Minimal  Motor Activity Slow  Appearance/Hygiene Unremarkable  Behavior Characteristics Cooperative  Mood Depressed;Anxious  Thought Process  Coherency WDL  Content WDL  Delusions None reported or observed  Perception WDL  Hallucination None reported or observed  Judgment Limited  Confusion None  Danger to Self  Current suicidal ideation? Passive  Self-Injurious Behavior No self-injurious ideation or behavior indicators observed or expressed   Description of Agreement verbal  Danger to Others  Danger to Others None reported or observed

## 2024-02-13 NOTE — Plan of Care (Signed)
  Problem: Coping: Goal: Ability to verbalize frustrations and anger appropriately will improve Outcome: Progressing Goal: Ability to demonstrate self-control will improve Outcome: Progressing   Problem: Health Behavior/Discharge Planning: Goal: Identification of resources available to assist in meeting health care needs will improve Outcome: Progressing   

## 2024-02-13 NOTE — Group Note (Signed)
 Date:  02/13/2024 Time:  8:24 PM  Group Topic/Focus:  Orientation:   The focus of this group is to educate the patient on the purpose and policies of crisis stabilization and provide a format to answer questions about their admission.  The group details unit policies and expectations of patients while admitted.    Participation Level:  Active  Participation Quality:  Appropriate and Attentive  Affect:  Appropriate  Cognitive:  Alert and Appropriate  Insight: Appropriate, Good, and Improving  Engagement in Group:  Developing/Improving and Engaged  Modes of Intervention:  Clarification, Discussion, Education, Orientation, Rapport Building, and Support  Additional Comments:     Kinza Gouveia 02/13/2024, 8:24 PM

## 2024-02-13 NOTE — Progress Notes (Signed)
 Pt calm and pleasant during assessment denying HI/AVH. Pt endorses passive SI, verbally contracts for safety. Pt observed interacting appropriately with staff and peers on the unit. Pt compliant with medication administration pre MD orders. Pt given education, support, and encouragement to be active in his treatment plan. Pt being monitored Q 15 minutes for safety per unit protocol, remains safe on the unit

## 2024-02-13 NOTE — Plan of Care (Signed)
  Problem: Safety: Goal: Periods of time without injury will increase Outcome: Progressing   Problem: Education: Goal: Emotional status will improve Outcome: Not Progressing Goal: Mental status will improve Outcome: Not Progressing   Problem: Activity: Goal: Interest or engagement in activities will improve Outcome: Not Progressing   Problem: Coping: Goal: Ability to verbalize frustrations and anger appropriately will improve Outcome: Not Progressing

## 2024-02-13 NOTE — Group Note (Signed)
 Recreation Therapy Group Note   Group Topic:Relaxation  Group Date: 02/13/2024 Start Time: 1045 End Time: 1130 Facilitators: Deatrice Factor, LRT, CTRS Location:  Craft Room  Group Description: PMR (Progressive Muscle Relaxation). LRT asks patients their current level of stress/anxiety from 1-10, with 10 being the highest. LRT educates patients on what PMR is and the benefits that come from it. Patients are asked to sit with their feet flat on the floor while sitting up and all the way back in their chair, if possible. LRT and pts follow a prompt through a speaker that requires you to tense and release different muscles in their body and focus on their breathing. During session, lights are off and soft music is being played. Pts are given a stress ball to use if needed. At the end of the prompt, LRT asks patients to rank their current levels of stress/anxiety from 1-10, 10 being the highest. LRT provides patients with an education handout on PMR.   Goal Area(s) Addressed:  Patients will be able to describe progressive muscle relaxation.  Patient will practice using relaxation technique. Patient will identify a new coping skill.  Patient will follow multistep directions to reduce anxiety and stress.   Affect/Mood: N/A   Participation Level: Did not attend    Clinical Observations/Individualized Feedback: Patient did not attend group.   Plan: Continue to engage patient in RT group sessions 2-3x/week.   Deatrice Factor, LRT, CTRS 02/13/2024 11:39 AM

## 2024-02-13 NOTE — Progress Notes (Signed)
   02/12/24 2000  Psych Admission Type (Psych Patients Only)  Admission Status Voluntary  Psychosocial Assessment  Patient Complaints None  Eye Contact Fair  Facial Expression Flat  Affect Appropriate to circumstance  Speech Logical/coherent  Interaction Minimal  Motor Activity Slow  Appearance/Hygiene Unremarkable  Behavior Characteristics Cooperative;Appropriate to situation  Mood Depressed;Pleasant  Thought Process  Coherency WDL  Content WDL  Delusions None reported or observed  Perception WDL  Hallucination None reported or observed  Judgment Limited  Confusion None  Danger to Self  Current suicidal ideation? Passive  Agreement Not to Harm Self No  Description of Agreement VERBAL  Danger to Others  Danger to Others None reported or observed

## 2024-02-13 NOTE — Group Note (Signed)
 [facility] LCSW Group Therapy Note    Group Date: 02/13/2024 Start Time: 1300 End Time: 1345  Type of Therapy and Topic:  Group Therapy:  Overcoming Obstacles  Participation Level:  [facility] PARTICIPATION LEVEL: Minimal   Description of Group:   In this group patients will be encouraged to explore what they see as obstacles to their own wellness and recovery. They will be guided to discuss their thoughts, feelings, and behaviors related to these obstacles. The group will process together ways to cope with barriers, with attention given to specific choices patients can make. Each patient will be challenged to identify changes they are motivated to make in order to overcome their obstacles. This group will be process-oriented, with patients participating in exploration of their own experiences as well as giving and receiving support and challenge from other group members.  Therapeutic Goals: 1. Patient will identify personal and current obstacles as they relate to admission. 2. Patient will identify barriers that currently interfere with their wellness or overcoming obstacles.  3. Patient will identify feelings, thought process and behaviors related to these barriers. 4. Patient will identify two changes they are willing to make to overcome these obstacles:    Summary of Patient Progress Patient was present for the entirety of the group discussion. He did not engage much in the main discussion but did identify family issues as an obstacle that he has to overcome. Insight into himself and the topic appear questionable.    Therapeutic Modalities:   Cognitive Behavioral Therapy Solution Focused Therapy Motivational Interviewing Relapse Prevention Therapy   Randolm Butte, LCSW

## 2024-02-14 NOTE — Plan of Care (Signed)

## 2024-02-14 NOTE — Progress Notes (Signed)
   02/14/24 1000  Psych Admission Type (Psych Patients Only)  Admission Status Voluntary  Psychosocial Assessment  Patient Complaints Depression  Eye Contact Avoids  Facial Expression Sad  Affect Depressed  Speech Soft;Logical/coherent  Interaction Minimal  Motor Activity Other (Comment) (WNL)  Appearance/Hygiene Unremarkable  Behavior Characteristics Cooperative  Mood Depressed  Thought Process  Coherency WDL  Content WDL  Delusions None reported or observed  Perception WDL  Hallucination None reported or observed  Judgment Impaired  Confusion None  Danger to Self  Current suicidal ideation? Passive  Agreement Not to Harm Self Yes  Description of Agreement verbal

## 2024-02-14 NOTE — Group Note (Signed)
 Recreation Therapy Group Note   Group Topic:Goal Setting  Group Date: 02/14/2024 Start Time: 1000 End Time: 1100 Facilitators: Deatrice Factor, LRT, CTRS Location:  Craft Room  Group Description: Product/process development scientist. Patients were given many different magazines, a glue stick, markers, and a piece of cardstock paper. LRT and pts discussed the importance of having goals in life. LRT and pts discussed the difference between short-term and long-term goals, as well as what a SMART goal is. LRT encouraged pts to create a vision board, with images they picked and then cut out with safety scissors from the magazine, for themselves, that capture their short and long-term goals. LRT encouraged pts to show and explain their vision board to the group.   Goal Area(s) Addressed:  Patient will gain knowledge of short vs. long term goals.  Patient will identify goals for themselves. Patient will practice setting SMART goals. Patient will verbalize their goals to LRT and peers.   Affect/Mood: Appropriate   Participation Level: Active and Engaged   Participation Quality: Independent   Behavior: Appropriate, Calm, and Cooperative   Speech/Thought Process: Coherent   Insight: Fair   Judgement: Good   Modes of Intervention: Art   Patient Response to Interventions:  Attentive, Engaged, Interested , and Receptive   Education Outcome:  Acknowledges education   Clinical Observations/Individualized Feedback: Zoltan was active in their participation of session activities and group discussion. Pt identified "I want a big car and a big house" as his goals. Pt appropriately identified images to reflect these goals. Pt interacted well with LRT and peers duration of session.    Plan: Continue to engage patient in RT group sessions 2-3x/week.   Deatrice Factor, LRT, CTRS 02/14/2024 11:48 AM

## 2024-02-14 NOTE — Progress Notes (Signed)
 Pt presents more flat and depressed tonight than last night. Pt is denying HI/AVH. Pt endorses passive SI, verbally contracts for safety. Pt observed interacting appropriately with staff and peers on the unit. Pt compliant with medication administration pre MD orders. Pt given education, support, and encouragement to be active in his treatment plan. Pt being monitored Q 15 minutes for safety per unit protocol, remains safe on the unit

## 2024-02-14 NOTE — Plan of Care (Signed)
  Problem: Education: Goal: Verbalization of understanding the information provided will improve Outcome: Progressing   Problem: Education: Goal: Emotional status will improve Outcome: Not Progressing Goal: Mental status will improve Outcome: Not Progressing   Problem: Coping: Goal: Ability to verbalize frustrations and anger appropriately will improve Outcome: Not Progressing

## 2024-02-14 NOTE — Group Note (Signed)
 Date:  02/14/2024 Time:  9:40 AM  Group Topic/Focus:  Personal Choices and Values:   The focus of this group is to help patients assess and explore the importance of values in their lives, how their values affect their decisions, how they express their values and what opposes their expression.    Participation Level:  Did Not Attend  Participation Quality:    Affect:    Cognitive:    Insight:   Engagement in Group:    Modes of Intervention:    Additional Comments:    Adelyna Brockman 02/14/2024, 9:40 AM

## 2024-02-14 NOTE — Group Note (Signed)
 Date:  02/14/2024 Time:  9:08 PM  Group Topic/Focus:  Wrap-Up Group:   The focus of this group is to help patients review their daily goal of treatment and discuss progress on daily workbooks.    Participation Level:  Active  Participation Quality:  Appropriate and Supportive  Affect:  Appropriate  Cognitive:  Appropriate  Insight: Appropriate  Engagement in Group:  Limited  Modes of Intervention:  Discussion  Additional Comments:     Fabiola Holy 02/14/2024, 9:08 PM

## 2024-02-14 NOTE — Progress Notes (Addendum)
 [facility] MD Progress Note  02/14/2024 7:18 PM Robert Chan  MRN:  [id number] Subjective:  "just too much stress" Principal Problem: Cocaine abuse with cocaine-induced mood disorder (HCC) Diagnosis: Principal Problem:   Cocaine abuse with cocaine-induced mood disorder (HCC) Active Problems:   Cocaine use disorder (HCC)   Expand All Collapse All  [facility] MD Progress Note   02/13/2024 9:21 PM Robert Chan  MRN:  [id number] Subjective:  ""just too much stress in the family" Principal Problem: Cocaine abuse with cocaine-induced mood disorder (HCC) Diagnosis: Principal Problem:   Cocaine abuse with cocaine-induced mood disorder (HCC) Active Problems:   Cocaine use disorder (HCC)     Robert Chan is a 68 y.o. AA male with a past psychiatric history of MDD, alcohol use disorder, and cocaine use disorder, and medical hx of CAD, GERD, HLD COPD, hypertension, and history of NSTEMI initially presented to [name] ED with chief complaints of chest pain, patient later endorsed suicidal ideations for the last week since his sister passed away, that this past 03-07-24 he was planning to overdose on pills however his daughter showed up to his home and interrupted him.  Patient was medically cleared as workup was deemed to be unremarkable, diagnosis of nonspecific chest pain, administered Toradol IV, Robaxin 500 mg.  Reported use of crack cocaine and alcohol.  BAL less than 10, UDS positive for cocaine, creatinine elevated at 1.28   Patient is seen today for a follow up assessment .  Affect remains  constricted, minimally engaged during conversation.  Eye contact is poor, seems to be somewhat evasive/guarded.  He endorses concrete thinking with minimal interactions. States that he has been experiencing stressful situations including losing his loved ones.  He reports  that he has always struggled with significant depression and anxiety.  He is  endorsing racing thoughts, excessive worries, worthlessness,  hopelessness, difficulties concentrating, reduced appetite and continued suicidal thoughts without plan.  Appears to be somewhat withdrawn with no significant motivation for group activities.   States that he has lost multiple brothers to alcohol and drugs, does not have any social support.  His daughter does come to visit him at times, however he does not feel this is enough.  Lives at home alone.  He is unable to state when was the last time he was on medications for depression, does not follow up with outpatient mental health provider or therapist.   He reports no concerns with his current medications. He reports he was not taking medications or getting any therapy and does not share why. He reports his appetite is ok and he is able to sleep decent hours.    Patient continues to experience depressive symptoms including  hopelessness, lack of motivation, suicidal ideations, difficulty focusing, and poor judgement and poor insight. He experiences anhedonia  with no specific idea about after care plan. He reports not having any therapy/psychiatric outpatient services but also not willing to discuss about it.  There is no significant improvement  since last encounter.    We will continue to provide support and encouragements. We will continue current medication regimen as well as safety precautions per unit protocol.        Total Time spent with patient: 30 minutes  Past Psychiatric History: Depression, Substance abuse  Past Medical History:  Past Medical History:  Diagnosis Date   Acute bilateral low back pain without sciatica 02/27/2021   Asthma    CAD (coronary artery disease)    Cataract    CKD (chronic  kidney disease)    Cocaine use disorder, mild, abuse (HCC)    COPD (chronic obstructive pulmonary disease) (HCC)    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   Depression    Elevated serum creatinine 08/28/2019   Homelessness 06/19/2020   Homicidal ideations 05/25/2023   Hypertension    Major  depressive disorder, recurrent episode, 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/11/2022   NSTEMI (non-ST elevated myocardial infarction) (HCC)    Schizoaffective disorder (HCC) 11/08/2022   Status post incision and drainage 04/22/2021   Suicidal ideation 06/05/2020    Past Surgical History:  Procedure Laterality Date   APPENDECTOMY     EYE SURGERY     LEFT HEART CATH AND CORONARY ANGIOGRAPHY N/A 06/05/2020   Procedure: LEFT HEART CATH AND CORONARY ANGIOGRAPHY;  Surgeon: Cody Das, MD;  Location: [facility];  Service: Cardiovascular;  Laterality: N/A;   PROSTATE SURGERY     Family History:  Family History  Problem Relation Age of Onset   Hypertension Mother    Diabetes Mother    Hypertension Father    Hypertension Sister    Family Psychiatric  History: NA Social History:  Social History   Substance and Sexual Activity  Alcohol Use Yes   Comment: 1 40oz beer about every other day     Social History   Substance and Sexual Activity  Drug Use Yes   Types: Cocaine   Comment: Cocaine about once or twice a month; last use one week ago    Social History   Socioeconomic History   Marital status: Single    Spouse name: Not on file   Number of children: Not on file   Years of education: Not on file   Highest education level: Not on file  Occupational History   Not on file  Tobacco Use   Smoking status: Some Days    Current packs/day: 0.00    Types: Cigarettes    Last attempt to quit: 12/25/2020    Years since quitting: 3.1   Smokeless tobacco: Never   Tobacco comments:    Smokes a couple cigarettes every now and then  Vaping Use   Vaping status: Never Used  Substance and Sexual Activity   Alcohol use: Yes    Comment: 1 40oz beer about every other day   Drug use: Yes    Types: Cocaine    Comment: Cocaine about once or twice a month; last use one week ago   Sexual activity: Not Currently  Other Topics Concern   Not on file  Social History Narrative   His mother passed at  age 70 (when he was 44 years old) and his father raised him and his siblings   His parents were pastors as a lot of his sisters and other family members are today   There was a total of 13 children in his family Had 41 sisters, one sister deceased , Had 3 brothers, all brothers deceased   He is the youngest of the 41 and was "always in trouble in my younger years"   Family still live in [place], [place], some in [place], [place]   Has a Daughter and son with a total of 10 grandchildren (6 grand daughters local and 4 grandsons)    Values family   Married twice, present wife incarcerated   Social Drivers of Corporate investment banker Strain: Low Risk  (10/19/2022)   Overall Financial Resource Strain (CARDIA)    Difficulty of Paying Living Expenses: Not hard at  all  Food Insecurity: Food Insecurity Present (02/10/2024)   Hunger Vital Sign    Worried About Running Out of Food in the Last Year: Sometimes true    Ran Out of Food in the Last Year: Sometimes true  Transportation Needs: Unmet Transportation Needs (02/10/2024)   PRAPARE - Transportation    Lack of Transportation (Medical): Yes    Lack of Transportation (Non-Medical): Yes  Physical Activity: Inactive (10/19/2022)   Exercise Vital Sign    Days of Exercise per Week: 0 days    Minutes of Exercise per Session: 0 min  Stress: No Stress Concern Present (10/19/2022)   [name] of Occupational Health - Occupational Stress Questionnaire    Feeling of Stress : Not at all  Social Connections: Unknown (02/10/2024)   Social Connection and Isolation Panel [NHANES]    Frequency of Communication with Friends and Family: Twice a week    Frequency of Social Gatherings with Friends and Family: Patient declined    Attends Religious Services: Never    Database administrator or Organizations: No    Attends Engineer, structural: Never    Marital Status: Patient declined   Additional Social History:                          Sleep: Fair  Appetite:  Fair  Current Medications: Current Facility-Administered Medications  Medication Dose Route Frequency Provider Last Rate Last Admin   acetaminophen (TYLENOL) tablet 650 mg  650 mg Oral Q6H PRN Tingling, Stephanie, PA-C       albuterol (VENTOLIN HFA) 108 (90 Base) MCG/ACT inhaler 2 puff  2 puff Inhalation Q6H PRN Tingling, Stephanie, PA-C       aspirin EC tablet 81 mg  81 mg Oral Daily Tingling, Stephanie, PA-C   81 mg at 02/14/24 0832   atorvastatin (LIPITOR) tablet 80 mg  80 mg Oral Daily Tingling, Stephanie, PA-C   80 mg at 02/14/24 0831   cholecalciferol (VITAMIN D3) 25 MCG (1000 UNIT) tablet 1,000 Units  1,000 Units Oral Daily Tingling, Stephanie, PA-C   1,000 Units at 02/14/24 4098   cyanocobalamin (VITAMIN B12) tablet 1,000 mcg  1,000 mcg Oral Daily Tingling, Stephanie, PA-C   1,000 mcg at 02/14/24 0833   fluticasone furoate-vilanterol (BREO ELLIPTA) 200-25 MCG/ACT 1 puff  1 puff Inhalation Daily Tingling, Stephanie, PA-C   1 puff at 02/14/24 1191   folic acid (FOLVITE) tablet 1 mg  1 mg Oral Daily Tingling, Stephanie, PA-C   1 mg at 02/14/24 4782   hydrOXYzine (ATARAX) tablet 25 mg  25 mg Oral TID PRN Tingling, Stephanie, PA-C       magnesium hydroxide (MILK OF MAGNESIA) suspension 30 mL  30 mL Oral Daily PRN Tingling, Stephanie, PA-C       mirtazapine (REMERON) tablet 7.5 mg  7.5 mg Oral QHS Tingling, Stephanie, PA-C   7.5 mg at 02/13/24 2106   multivitamin with minerals tablet 1 tablet  1 tablet Oral Daily Tingling, Stephanie, PA-C   1 tablet at 02/14/24 0832   OLANZapine (ZYPREXA) injection 5 mg  5 mg Intramuscular TID PRN Tingling, Stephanie, PA-C       OLANZapine zydis (ZYPREXA) disintegrating tablet 5 mg  5 mg Oral TID PRN Tingling, Stephanie, PA-C       sertraline (ZOLOFT) tablet 200 mg  200 mg Oral Daily Tingling, Stephanie, PA-C   200 mg at 02/14/24 9562    Lab Results: No results found for this  or any previous visit (from the past 48  hours).  Blood Alcohol level:  Lab Results  Component Value Date   ETH <10 02/09/2024   ETH <10 10/10/2023    Metabolic Disorder Labs: Lab Results  Component Value Date   HGBA1C 5.1 09/15/2023   MPG 99.67 09/15/2023   MPG 103 01/13/2023   No results found for: "PROLACTIN" Lab Results  Component Value Date   CHOL 159 09/15/2023   TRIG 53 09/15/2023   HDL 61 09/15/2023   CHOLHDL 2.6 09/15/2023   VLDL 11 09/15/2023   LDLCALC 87 09/15/2023   LDLCALC 99 01/13/2023    Physical Findings: AIMS:  , ,  ,  ,    CIWA:  CIWA-Ar Total: 0 COWS:     Musculoskeletal: Strength & Muscle Tone: within normal limits Gait & Station: normal Patient leans: N/A  Psychiatric Specialty Exam:  Presentation  General Appearance:  Casual  Eye Contact: Fair  Speech: Normal Rate  Speech Volume: Normal  Handedness: Right   Mood and Affect  Mood: Anxious; Depressed; Hopeless  Affect: Congruent   Thought Process  Thought Processes: Coherent  Descriptions of Associations:Circumstantial  Orientation:Full (Time, Place and Person)  Thought Content:WDL  History of Schizophrenia/Schizoaffective disorder:No  Duration of Psychotic Symptoms:N/A  Hallucinations:Hallucinations: None  Ideas of Reference:None  Suicidal Thoughts:Suicidal Thoughts: No  Homicidal Thoughts:Homicidal Thoughts: No   Sensorium  Memory: Immediate Fair; Recent Fair; Remote Fair  Judgment: Fair  Insight: Fair   Art therapist  Concentration: Fair  Attention Span: Fair  Recall: [employer] of Knowledge: Fair  Language: Fair   Psychomotor Activity  Psychomotor Activity: Psychomotor Activity: Normal   Assets  Assets: Communication Skills; Desire for Improvement   Sleep  Sleep: Sleep: Fair Number of Hours of Sleep: 6    Physical Exam: Physical Exam Vitals and nursing note reviewed.  Constitutional:      Appearance: Normal appearance.  HENT:     Head:  Normocephalic and atraumatic.     Right Ear: Tympanic membrane normal.     Left Ear: Tympanic membrane normal.     Nose: Nose normal.     Mouth/Throat:     Mouth: Mucous membranes are moist.  Eyes:     Extraocular Movements: Extraocular movements intact.     Pupils: Pupils are equal, round, and reactive to light.  Cardiovascular:     Rate and Rhythm: Normal rate.     Pulses: Normal pulses.  Pulmonary:     Effort: Pulmonary effort is normal.  Musculoskeletal:        General: Normal range of motion.     Cervical back: Normal range of motion and neck supple.  Neurological:     General: No focal deficit present.     Mental Status: He is alert and oriented to person, place, and time.    Review of Systems  Constitutional: Negative.   HENT: Negative.    Eyes: Negative.   Respiratory: Negative.    Cardiovascular: Negative.   Gastrointestinal: Negative.   Genitourinary: Negative.   Musculoskeletal: Negative.   Skin: Negative.   Neurological: Negative.   Endo/Heme/Allergies: Negative.   Psychiatric/Behavioral:  Positive for depression and substance abuse. The patient is nervous/anxious.    Blood pressure (!) 153/81, pulse 66, temperature (!) 97.3 F (36.3 C), resp. rate (!) 22, height 6\' 4"  (1.93 m), weight 111.1 kg, SpO2 99%. Body mass index is 29.82 kg/m.   Treatment Plan Summary: Daily contact with patient to assess and evaluate symptoms and  progress in treatment and Medication management  Elston Halsted, NP 02/14/2024, 7:18 PM

## 2024-02-14 NOTE — Group Note (Signed)
 Date:  02/14/2024 Time:  2:49 PM  Group Topic/Focus:  Activity Group: The focus of the group is to promote activity for the patients and encourage them to go outside to the courtyard for some fresh air and some exercise.    Participation Level:  Did Not Attend   Marianna Shirk Marliyah Reid 02/14/2024, 2:49 PM

## 2024-02-15 NOTE — BH IP Treatment Plan (Signed)
 Interdisciplinary Treatment and Diagnostic Plan Update  02/15/2024 Time of Session: 9:00 AM Tivon Lemoine MRN: [id number]  Principal Diagnosis: Cocaine abuse with cocaine-induced mood disorder (HCC)  Secondary Diagnoses: Principal Problem:   Cocaine abuse with cocaine-induced mood disorder (HCC) Active Problems:   Cocaine use disorder (HCC)   Current Medications:  Current Facility-Administered Medications  Medication Dose Route Frequency Provider Last Rate Last Admin   acetaminophen (TYLENOL) tablet 650 mg  650 mg Oral Q6H PRN Tingling, Stephanie, PA-C       albuterol (VENTOLIN HFA) 108 (90 Base) MCG/ACT inhaler 2 puff  2 puff Inhalation Q6H PRN Tingling, Stephanie, PA-C       aspirin EC tablet 81 mg  81 mg Oral Daily Tingling, Stephanie, PA-C   81 mg at 02/15/24 0852   atorvastatin (LIPITOR) tablet 80 mg  80 mg Oral Daily Tingling, Stephanie, PA-C   80 mg at 02/15/24 0852   cholecalciferol (VITAMIN D3) 25 MCG (1000 UNIT) tablet 1,000 Units  1,000 Units Oral Daily Tingling, Stephanie, PA-C   1,000 Units at 02/15/24 0852   cyanocobalamin (VITAMIN B12) tablet 1,000 mcg  1,000 mcg Oral Daily Tingling, Stephanie, PA-C   1,000 mcg at 02/15/24 0852   fluticasone furoate-vilanterol (BREO ELLIPTA) 200-25 MCG/ACT 1 puff  1 puff Inhalation Daily Tingling, Stephanie, PA-C   1 puff at 02/15/24 1308   folic acid (FOLVITE) tablet 1 mg  1 mg Oral Daily Tingling, Stephanie, PA-C   1 mg at 02/15/24 6578   hydrOXYzine (ATARAX) tablet 25 mg  25 mg Oral TID PRN Tingling, Stephanie, PA-C       magnesium hydroxide (MILK OF MAGNESIA) suspension 30 mL  30 mL Oral Daily PRN Tingling, Stephanie, PA-C       mirtazapine (REMERON) tablet 7.5 mg  7.5 mg Oral QHS Tingling, Stephanie, PA-C   7.5 mg at 02/14/24 2103   multivitamin with minerals tablet 1 tablet  1 tablet Oral Daily Tingling, Stephanie, PA-C   1 tablet at 02/15/24 0852   OLANZapine (ZYPREXA) injection 5 mg  5 mg Intramuscular TID PRN Tingling, Stephanie,  PA-C       OLANZapine zydis (ZYPREXA) disintegrating tablet 5 mg  5 mg Oral TID PRN Tingling, Stephanie, PA-C       sertraline (ZOLOFT) tablet 200 mg  200 mg Oral Daily Tingling, Stephanie, PA-C   200 mg at 02/15/24 4696   PTA Medications: Medications Prior to Admission  Medication Sig Dispense Refill Last Dose/Taking   acetaminophen (TYLENOL) 325 MG tablet Take 2 tablets (650 mg total) by mouth every 6 (six) hours as needed for mild pain (pain score 1-3).      albuterol (VENTOLIN HFA) 108 (90 Base) MCG/ACT inhaler Inhale 2 puffs into the lungs every 6 (six) hours as needed for shortness of breath. 18 g 1    aspirin EC 81 MG tablet Take 1 tablet (81 mg total) by mouth daily. Swallow whole.      atorvastatin (LIPITOR) 80 MG tablet Take 1 tablet (80 mg total) by mouth daily. 90 tablet 3    docusate sodium (COLACE) 100 MG capsule Take 1 capsule (100 mg total) by mouth 2 (two) times daily. (Patient taking differently: Take 100 mg by mouth daily as needed for mild constipation.) 60 capsule 3    latanoprost (XALATAN) 0.005 % ophthalmic solution Place 1 drop into both eyes at bedtime. (Patient not taking: Reported on 12/21/2023)      loratadine (CLARITIN) 10 MG tablet Take 1 tablet (10 mg total) by  mouth once for 1 dose. (Patient taking differently: Take 10 mg by mouth daily as needed for allergies.)      mometasone-formoterol (DULERA) 200-5 MCG/ACT AERO Inhale 2 puffs into the lungs 2 (two) times daily. 13 g 3    senna-docusate (SENOKOT-S) 8.6-50 MG tablet Take 1 tablet by mouth at bedtime as needed for mild constipation. 30 tablet 0    sertraline (ZOLOFT) 50 MG tablet Take 1 tablet (50 mg total) by mouth daily. 30 tablet 3     Patient Stressors:    Patient Strengths:    Treatment Modalities: Medication Management, Group therapy, Case management,  1 to 1 session with clinician, Psychoeducation, Recreational therapy.   Physician Treatment Plan for Primary Diagnosis: Cocaine abuse with  cocaine-induced mood disorder (HCC) Long Term Goal(s):     Short Term Goals:    Medication Management: Evaluate patient's response, side effects, and tolerance of medication regimen.  Therapeutic Interventions: 1 to 1 sessions, Unit Group sessions and Medication administration.  Evaluation of Outcomes: Progressing  Physician Treatment Plan for Secondary Diagnosis: Principal Problem:   Cocaine abuse with cocaine-induced mood disorder (HCC) Active Problems:   Cocaine use disorder (HCC)  Long Term Goal(s):     Short Term Goals:       Medication Management: Evaluate patient's response, side effects, and tolerance of medication regimen.  Therapeutic Interventions: 1 to 1 sessions, Unit Group sessions and Medication administration.  Evaluation of Outcomes: Progressing   RN Treatment Plan for Primary Diagnosis: Cocaine abuse with cocaine-induced mood disorder (HCC) Long Term Goal(s): Knowledge of disease and therapeutic regimen to maintain health will improve  Short Term Goals: Ability to remain free from injury will improve, Ability to verbalize frustration and anger appropriately will improve, Ability to demonstrate self-control, Ability to participate in decision making will improve, Ability to verbalize feelings will improve, Ability to disclose and discuss suicidal ideas, Ability to identify and develop effective coping behaviors will improve, and Compliance with prescribed medications will improve   Medication Management: RN will administer medications as ordered by provider, will assess and evaluate patient's response and provide education to patient for prescribed medication. RN will report any adverse and/or side effects to prescribing provider.  Therapeutic Interventions: 1 on 1 counseling sessions, Psychoeducation, Medication administration, Evaluate responses to treatment, Monitor vital signs and CBGs as ordered, Perform/monitor CIWA, COWS, AIMS and Fall Risk screenings as  ordered, Perform wound care treatments as ordered.  Evaluation of Outcomes: Progressing   LCSW Treatment Plan for Primary Diagnosis: Cocaine abuse with cocaine-induced mood disorder (HCC) Long Term Goal(s): Safe transition to appropriate next level of care at discharge, Engage patient in therapeutic group addressing interpersonal concerns.  Short Term Goals: Engage patient in aftercare planning with referrals and resources, Increase social support, Increase ability to appropriately verbalize feelings, Increase emotional regulation, Facilitate acceptance of mental health diagnosis and concerns, Facilitate patient progression through stages of change regarding substance use diagnoses and concerns, Identify triggers associated with mental health/substance abuse issues, and Increase skills for wellness and recovery   Therapeutic Interventions: Assess for all discharge needs, 1 to 1 time with Social worker, Explore available resources and support systems, Assess for adequacy in community support network, Educate family and significant other(s) on suicide prevention, Complete Psychosocial Assessment, Interpersonal group therapy.  Evaluation of Outcomes: Progressing   Progress in Treatment: Attending groups: No. 02/15/23 Update: Yes. And. No.  Participating in groups: No. 02/15/23 Update: Yes. And. No.  Taking medication as prescribed: Yes. 02/15/23 Update: Yes. Toleration medication:  Yes.02/15/23 Update: Yes. Family/Significant other contact made: No, will contact:  when given permission. 02/15/23 Update: Yes. Education Completed; Alvan Jews, daughter, [phone],  has been identified by the patient as the family member/significant other with whom the patient will be residing, and identified as the person(s) who will aid the patient in the event of a mental health crisis (suicidal ideations/suicide attempt).  With written consent from the patient, the family member/significant other has been  provided the following suicide prevention education, prior to the and/or following the discharge of the patient.  Patient understands diagnosis: Yes. 02/15/23 Update: Yes. Discussing patient identified problems/goals with staff: Yes.02/15/23 Update: Yes. Medical problems stabilized or resolved: Yes. 02/15/23 Update: Yes. Denies suicidal/homicidal ideation: No. 02/15/23 Update: Yes. Issues/concerns per patient self-inventory: No. 02/15/23 Update: No. Other: none. 02/15/23 Update: None.  New problem(s) identified: No, Describe:  none identified. 02/15/23 Update: No, Describe:  none identified.    New Short Term/Long Term Goal(s):  detox, medication management for mood stabilization; elimination of SI thoughts; development of comprehensive mental wellness/sobriety plan. 02/15/23 Update: Goal to remain the same.      Patient Goals: "To get better."  02/15/23 Update: Patient's goal to remain the same.    Discharge Plan or Barriers: CSW will assist pt with development of an appropriate aftercare/discharge plan. 02/15/23 Update: CSW goal to remain the same.     Reason for Continuation of Hospitalization: Depression Medication stabilization Suicidal ideation Withdrawal symptoms  Estimated Length of Stay: 1-7 days 02/15/23 Update: TBD    Last 3 [place] Suicide Severity Risk Score: Flowsheet Row Admission (Current) from 02/10/2024 in [facility] ED from 02/09/2024 in [facility] Emergency Department at [facility] ED from [facility]07/20[facility] in [facility]  C-SSRS RISK CATEGORY Low Risk No Risk No Risk       Last PHQ 2/9 Scores:    12/29/2023    4:08 PM 11/09/2023    8:38 AM 09/21/2023    2:39 PM  Depression screen PHQ 2/9  Decreased Interest 2 1 1   Down, Depressed, Hopeless 1 2 0  PHQ - 2 Score 3 3 1   Altered sleeping 0 1   Tired, decreased energy 1 2   Change in appetite 0 1   Feeling bad or failure about yourself  1 1   Trouble  concentrating 1 0   Moving slowly or fidgety/restless 1 2   Suicidal thoughts 0 1   PHQ-9 Score 7 11     Scribe for Treatment Team: Zonya Gudger M Eddis Pingleton, Buzz Cass 02/15/2024 2:34 PM

## 2024-02-15 NOTE — Group Note (Signed)
 Date:  02/15/2024 Time:  11:41 AM  Group Topic/Focus:   Goals Group:   The focus of this group is to help patients establish daily goals to achieve during treatment and discuss how the patient can incorporate goal setting into their daily lives to aide in recovery.  Participation Level:  Active  Participation Quality:  Appropriate  Affect:  Appropriate  Cognitive:  Appropriate  Insight: Appropriate  Engagement in Group:  Engaged  Modes of Intervention:  Discussion and Education  Additional Comments:    Robert Chan [name] 02/15/2024, 11:41 AM

## 2024-02-15 NOTE — Group Note (Signed)
 Date:  02/15/2024 Time:  9:17 PM  Group Topic/Focus:  Wrap-Up Group:   The focus of this group is to help patients review their daily goal of treatment and discuss progress on daily workbooks.    Participation Level:  Active  Participation Quality:  Appropriate and Attentive  Affect:  Appropriate  Cognitive:  Appropriate  Insight: Appropriate  Engagement in Group:  Engaged and Supportive  Modes of Intervention:  Discussion and Support  Additional Comments:     Fabiola Holy 02/15/2024, 9:17 PM

## 2024-02-15 NOTE — Progress Notes (Signed)
   02/15/24 0852  Psych Admission Type (Psych Patients Only)  Admission Status Voluntary  Psychosocial Assessment  Patient Complaints Depression  Eye Contact Fair  Facial Expression Flat  Affect Depressed  Speech Logical/coherent  Interaction Minimal  Motor Activity Slow  Appearance/Hygiene Unremarkable  Behavior Characteristics Cooperative;Appropriate to situation  Mood Depressed;Pleasant  Thought Process  Coherency WDL  Content WDL  Delusions None reported or observed  Perception WDL  Hallucination None reported or observed  Judgment WDL  Confusion None  Danger to Self  Current suicidal ideation? Passive  Self-Injurious Behavior No self-injurious ideation or behavior indicators observed or expressed   Agreement Not to Harm Self Yes  Description of Agreement verbal  Danger to Others  Danger to Others None reported or observed

## 2024-02-15 NOTE — Plan of Care (Signed)
  Problem: Education: Goal: Mental status will improve Outcome: Progressing   Problem: Activity: Goal: Interest or engagement in activities will improve Outcome: Progressing   Problem: Health Behavior/Discharge Planning: Goal: Compliance with treatment plan for underlying cause of condition will improve Outcome: Progressing   

## 2024-02-15 NOTE — Plan of Care (Signed)
   Problem: Education: Goal: Emotional status will improve Outcome: Progressing Goal: Mental status will improve Outcome: Progressing   Problem: Activity: Goal: Sleeping patterns will improve Outcome: Progressing

## 2024-02-15 NOTE — Progress Notes (Addendum)
 [facility] MD Progress Note  02/15/2024 6:54 PM Robert Chan  MRN:  [id number] Subjective:  "I am still getting better" Principal Problem: Cocaine abuse with cocaine-induced mood disorder (HCC) Diagnosis: Principal Problem:   Cocaine abuse with cocaine-induced mood disorder (HCC) Active Problems:   Cocaine use disorder (HCC)  Robert Chan is a 68 y.o. AA male with a past psychiatric history of MDD, alcohol use disorder, and cocaine use disorder, and medical hx of CAD, GERD, HLD COPD, hypertension, and history of NSTEMI initially presented to [facility] with chief complaints of chest pain, patient later endorsed suicidal ideations for the last week since his sister passed away, that this past 2024/03/27 he was planning to overdose on pills however his daughter showed up to his home and interrupted him.  Patient was medically cleared as workup was deemed to be unremarkable, diagnosis of nonspecific chest pain, administered Toradol IV, Robaxin 500 mg.  Reported use of crack cocaine and alcohol.  BAL less than 10, UDS positive for cocaine, creatinine elevated at 1.28   Patient is seen today for a follow up assessment .  Affect is improving, calm, with improved eye contact. He is more  engaged during conversation.  Eye contact is good.  Patient smiles and reports that he is getting better, but needs more time to stabilize.  States that he has been experiencing stressful situations in family,  including losing his loved ones. Reports that this has affected his wellbeing and precipitated his increased use of substances. He reports  that he has always struggled with significant depression and anxiety, but his family situation made it worse.   His thought process is clear, coherent. ,He denies suicidal/homicidal ideations. Denies AVH.   Currently in the dayroom, watching TV. His hygiene is improved.His appetite improved.   Patient reports no concerns about his current medications. He reports feeling supported and  safe here.     Patient's symptoms seem to improve as evidenced by his involvement is conversation throughout this assessment, his improved eye contact, his smile, and his denial of  suicidal ideations. He is more focused  with improved  judgement and insight. He He is now able to think about his his healthcare needs in terms aftercare plan. He reports not having any therapy/psychiatric outpatient services and needing referrals. No sign of distress noted.    We will continue to provide support and encouragements. We will continue current medication regimen as well as safety precautions per unit protocol. Patient to be reassessed in AM to determine discharge plan.     Total Time spent with patient: 30 minutes  Past Psychiatric History: MDD, Substance abuse  Past Medical History:  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   Depression    Elevated serum creatinine 08/28/2019   Homelessness 06/19/2020   Homicidal ideations 05/25/2023   Hypertension    Major depressive disorder, recurrent episode, 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/11/2022   NSTEMI (non-ST elevated myocardial infarction) (HCC)    Schizoaffective disorder (HCC) 11/08/2022   Status post incision and drainage 04/22/2021   Suicidal ideation 06/05/2020    Past Surgical History:  Procedure Laterality Date   APPENDECTOMY     EYE SURGERY  LEFT HEART CATH AND CORONARY ANGIOGRAPHY N/A 06/05/2020   Procedure: LEFT HEART CATH AND CORONARY ANGIOGRAPHY;  Surgeon: Cody Das, MD;  Location: [facility]IVE [facility];  Service: Cardiovascular;  Laterality: N/A;   PROSTATE SURGERY     Family History:  Family History  Problem Relation Age of Onset   Hypertension Mother     Diabetes Mother    Hypertension Father    Hypertension Sister    Family Psychiatric  History: NA Social History:  Social History   Substance and Sexual Activity  Alcohol Use Yes   Comment: 1 40oz beer about every other day     Social History   Substance and Sexual Activity  Drug Use Yes   Types: Cocaine   Comment: Cocaine about once or twice a month; last use one week ago    Social History   Socioeconomic History   Marital status: Single    Spouse name: Not on file   Number of children: Not on file   Years of education: Not on file   Highest education level: Not on file  Occupational History   Not on file  Tobacco Use   Smoking status: Some Days    Current packs/day: 0.00    Types: Cigarettes    Last attempt to quit: 12/25/2020    Years since quitting: 3.1   Smokeless tobacco: Never   Tobacco comments:    Smokes a couple cigarettes every now and then  Vaping Use   Vaping status: Never Used  Substance and Sexual Activity   Alcohol use: Yes    Comment: 1 40oz beer about every other day   Drug use: Yes    Types: Cocaine    Comment: Cocaine about once or twice a month; last use one week ago   Sexual activity: Not Currently  Other Topics Concern   Not on file  Social History Narrative   His mother passed at age 11 (when he was 50 years old) and his father raised him and his siblings   His parents were pastors as a lot of his sisters and other family members are today   There was a total of 13 children in his family Had 87 sisters, one sister deceased , Had 3 brothers, all brothers deceased   He is the youngest of the 13 and was "always in trouble in my younger years"   Family still live in [place], [place], some in [place], [place]   Has a Daughter and son with a total of 10 grandchildren (6 grand daughters local and 4 grandsons)    Values family   Married twice, present wife incarcerated   Social Drivers of Corporate investment banker Strain: Low Risk   (10/19/2022)   Overall Financial Resource Strain (CARDIA)    Difficulty of Paying Living Expenses: Not hard at all  Food Insecurity: Food Insecurity Present (02/10/2024)   Hunger Vital Sign    Worried About Running Out of Food in the Last Year: Sometimes true    Ran Out of Food in the Last Year: Sometimes true  Transportation Needs: Unmet Transportation Needs (02/10/2024)   PRAPARE - Transportation    Lack of Transportation (Medical): Yes    Lack of Transportation (Non-Medical): Yes  Physical Activity: Inactive (10/19/2022)   Exercise Vital Sign    Days of Exercise per Week: 0 days    Minutes of Exercise per Session: 0 min  Stress: No Stress Concern Present (10/19/2022)   [name] of  Occupational Health - Occupational Stress Questionnaire    Feeling of Stress : Not at all  Social Connections: Unknown (02/10/2024)   Social Connection and Isolation Panel [NHANES]    Frequency of Communication with Friends and Family: Twice a week    Frequency of Social Gatherings with Friends and Family: Patient declined    Attends Religious Services: Never    Database administrator or Organizations: No    Attends Engineer, structural: Never    Marital Status: Patient declined   Additional Social History:                         Sleep: Fair  Appetite:  Good  Current Medications: Current Facility-Administered Medications  Medication Dose Route Frequency Provider Last Rate Last Admin   acetaminophen (TYLENOL) tablet 650 mg  650 mg Oral Q6H PRN Tingling, Stephanie, PA-C       albuterol (VENTOLIN HFA) 108 (90 Base) MCG/ACT inhaler 2 puff  2 puff Inhalation Q6H PRN Tingling, Stephanie, PA-C       aspirin EC tablet 81 mg  81 mg Oral Daily Tingling, Stephanie, PA-C   81 mg at 02/15/24 0852   atorvastatin (LIPITOR) tablet 80 mg  80 mg Oral Daily Tingling, Stephanie, PA-C   80 mg at 02/15/24 4098   cholecalciferol (VITAMIN D3) 25 MCG (1000 UNIT) tablet 1,000 Units  1,000 Units  Oral Daily Tingling, Stephanie, PA-C   1,000 Units at 02/15/24 1191   cyanocobalamin (VITAMIN B12) tablet 1,000 mcg  1,000 mcg Oral Daily Tingling, Stephanie, PA-C   1,000 mcg at 02/15/24 0852   fluticasone furoate-vilanterol (BREO ELLIPTA) 200-25 MCG/ACT 1 puff  1 puff Inhalation Daily Tingling, Stephanie, PA-C   1 puff at 02/15/24 4782   folic acid (FOLVITE) tablet 1 mg  1 mg Oral Daily Tingling, Stephanie, PA-C   1 mg at 02/15/24 9562   hydrOXYzine (ATARAX) tablet 25 mg  25 mg Oral TID PRN Tingling, Stephanie, PA-C       magnesium hydroxide (MILK OF MAGNESIA) suspension 30 mL  30 mL Oral Daily PRN Tingling, Stephanie, PA-C       mirtazapine (REMERON) tablet 7.5 mg  7.5 mg Oral QHS Tingling, Stephanie, PA-C   7.5 mg at 02/14/24 2103   multivitamin with minerals tablet 1 tablet  1 tablet Oral Daily Tingling, Stephanie, PA-C   1 tablet at 02/15/24 0852   OLANZapine (ZYPREXA) injection 5 mg  5 mg Intramuscular TID PRN Tingling, Stephanie, PA-C       OLANZapine zydis (ZYPREXA) disintegrating tablet 5 mg  5 mg Oral TID PRN Tingling, Stephanie, PA-C       sertraline (ZOLOFT) tablet 200 mg  200 mg Oral Daily Tingling, Stephanie, PA-C   200 mg at 02/15/24 1308    Lab Results: No results found for this or any previous visit (from the past 48 hours).  Blood Alcohol level:  Lab Results  Component Value Date   ETH <10 02/09/2024   ETH <10 10/10/2023    Metabolic Disorder Labs: Lab Results  Component Value Date   HGBA1C 5.1 09/15/2023   MPG 99.67 09/15/2023   MPG 103 01/13/2023   No results found for: "PROLACTIN" Lab Results  Component Value Date   CHOL 159 09/15/2023   TRIG 53 09/15/2023   HDL 61 09/15/2023   CHOLHDL 2.6 09/15/2023   VLDL 11 09/15/2023   LDLCALC 87 09/15/2023   LDLCALC 99 01/13/2023    Physical Findings: AIMS:  , ,  ,  ,  CIWA:  CIWA-Ar Total: 0 COWS:     Musculoskeletal: Strength & Muscle Tone: within normal limits Gait & Station: normal Patient leans:  N/A  Psychiatric Specialty Exam:  Presentation  General Appearance:  Casual  Eye Contact: Fair  Speech: Normal Rate  Speech Volume: Normal  Handedness: Right   Mood and Affect  Mood: Depressed  Affect: Congruent   Thought Process  Thought Processes: Coherent  Descriptions of Associations:Intact  Orientation:Full (Time, Place and Person)  Thought Content:WDL  History of Schizophrenia/Schizoaffective disorder:No  Duration of Psychotic Symptoms:N/A  Hallucinations:Hallucinations: None  Ideas of Reference:None  Suicidal Thoughts:Suicidal Thoughts: No  Homicidal Thoughts:Homicidal Thoughts: No   Sensorium  Memory: Immediate Fair; Recent Fair; Remote Fair  Judgment: Fair  Insight: Fair   Art therapist  Concentration: Fair  Attention Span: Fair  Recall: [employer] of Knowledge: Fair  Language: Fair   Psychomotor Activity  Psychomotor Activity: Psychomotor Activity: Normal   Assets  Assets: Communication Skills; Desire for Improvement; Physical Health   Sleep  Sleep: Sleep: Fair    Physical Exam: Physical Exam Vitals and nursing note reviewed.  Constitutional:      Appearance: Normal appearance.  HENT:     Head: Normocephalic and atraumatic.     Right Ear: Tympanic membrane normal.     Left Ear: Tympanic membrane normal.     Nose: Nose normal.     Mouth/Throat:     Mouth: Mucous membranes are moist.  Eyes:     Extraocular Movements: Extraocular movements intact.     Pupils: Pupils are equal, round, and reactive to light.  Cardiovascular:     Rate and Rhythm: Normal rate.     Pulses: Normal pulses.  Pulmonary:     Effort: Pulmonary effort is normal.  Musculoskeletal:        General: Normal range of motion.     Cervical back: Normal range of motion and neck supple.  Neurological:     General: No focal deficit present.     Mental Status: He is alert and oriented to person, place, and time.   Psychiatric:        Behavior: Behavior normal.        Thought Content: Thought content normal.    ROS Blood pressure (!) 158/98, pulse 70, temperature (!) 97.3 F (36.3 C), resp. rate 20, height 6\' 4"  (1.93 m), weight 111.1 kg, SpO2 100%. Body mass index is 29.82 kg/m.   Treatment Plan Summary: Daily contact with patient to assess and evaluate symptoms and progress in treatment and Medication management Reassess in AM to determine discharge plan  Elston Halsted, NP 02/15/2024, 6:54 PM

## 2024-02-15 NOTE — Group Note (Signed)
 LCSW Group Therapy Note  Group Date: 02/15/2024 Start Time: 1330 End Time: 1445   Type of Therapy and Topic:  Group Therapy: Anger Cues and Responses  Participation Level:  None   Description of Group:   In this group, patients learned how to recognize the physical, cognitive, emotional, and behavioral responses they have to anger-provoking situations.  They identified a recent time they became angry and how they reacted.  They analyzed how their reaction was possibly beneficial and how it was possibly unhelpful.  The group discussed a variety of healthier coping skills that could help with such a situation in the future.  Focus was placed on how helpful it is to recognize the underlying emotions to our anger, because working on those can lead to a more permanent solution as well as our ability to focus on the important rather than the urgent.  Therapeutic Goals: Patients will remember their last incident of anger and how they felt emotionally and physically, what their thoughts were at the time, and how they behaved. Patients will identify how their behavior at that time worked for them, as well as how it worked against them. Patients will explore possible new behaviors to use in future anger situations. Patients will learn that anger itself is normal and cannot be eliminated, and that healthier reactions can assist with resolving conflict rather than worsening situations.  Summary of Patient Progress:   Patient was in group. Patient appeared attentive however, declined to participate in group discussions.    Therapeutic Modalities:   Cognitive Behavioral Therapy    Larri Ply, LCSW 02/15/2024  2:48 PM

## 2024-02-16 NOTE — Progress Notes (Signed)
 Pt visible in dayroom, noted to be withdrawn with limited peers' interaction.  Mood depressed and affect flat.  Pt endorsed anxiety and depression both rated 8/10 and stated "I feel what I feel.  That is the way I feel."  Pt unable to identify trigger and when asked for support system, he informed staff that his daughter and grandchildren are supportive and that he might go and live with his daughter when discharge.    02/15/24 2021  Psych Admission Type (Psych Patients Only)  Admission Status Voluntary  Psychosocial Assessment  Patient Complaints Depression  Eye Contact Fair  Facial Expression Anxious;Flat  Affect Depressed  Speech Logical/coherent  Interaction Minimal  Motor Activity Slow  Appearance/Hygiene Unremarkable  Behavior Characteristics Cooperative;Aggressive physically  Mood Depressed;Anxious  Thought Process  Coherency WDL  Content WDL  Delusions None reported or observed  Perception WDL  Hallucination None reported or observed  Judgment WDL  Confusion None  Danger to Self  Current suicidal ideation? Passive  Description of Suicide Plan [place]  Self-Injurious Behavior No self-injurious ideation or behavior indicators observed or expressed   Agreement Not to Harm Self Yes  Description of Agreement Verbal  Danger to Others  Danger to Others None reported or observed

## 2024-02-16 NOTE — Plan of Care (Signed)
  Problem: Education: Goal: Mental status will improve Outcome: Progressing   Problem: Health Behavior/Discharge Planning: Goal: Compliance with treatment plan for underlying cause of condition will improve Outcome: Progressing   Problem: Safety: Goal: Periods of time without injury will increase Outcome: Progressing   

## 2024-02-16 NOTE — Progress Notes (Signed)
   02/16/24 0841  Psych Admission Type (Psych Patients Only)  Admission Status Voluntary  Psychosocial Assessment  Patient Complaints Depression  Eye Contact Fair  Facial Expression Flat  Affect Depressed  Speech Logical/coherent  Interaction Minimal  Motor Activity Slow  Appearance/Hygiene Unremarkable  Behavior Characteristics Cooperative  Mood Depressed;Pleasant  Thought Process  Coherency WDL  Content WDL  Delusions None reported or observed  Perception WDL  Hallucination None reported or observed  Judgment WDL  Confusion None  Danger to Self  Current suicidal ideation? Passive  Self-Injurious Behavior No self-injurious ideation or behavior indicators observed or expressed   Agreement Not to Harm Self Yes  Description of Agreement verbal  Danger to Others  Danger to Others None reported or observed

## 2024-02-16 NOTE — Progress Notes (Addendum)
 [facility] MD Progress Note  02/16/2024 4:51 PM Robert Chan  MRN:  [id number] Subjective:  "I am getting there" Principal Problem: Cocaine abuse with cocaine-induced mood disorder (HCC) Diagnosis: Principal Problem:   Cocaine abuse with cocaine-induced mood disorder (HCC) Active Problems:   Cocaine use disorder (HCC)   Robert Chan is a 68 y.o. AA male with a past psychiatric history of MDD, alcohol use disorder, and cocaine use disorder, and medical hx of CAD, GERD, HLD COPD, hypertension, and history of NSTEMI initially presented to [facility], ED with chief complaints of chest pain, patient later endorsed suicidal ideations for the last week since his sister passed away, that this past 2024/02/29 he was planning to overdose on pills however his daughter showed up to his home and interrupted him.  Patient was medically cleared as workup was deemed to be unremarkable, diagnosis of nonspecific chest pain, administered Toradol IV, Robaxin 500 mg.  Reported use of crack cocaine and alcohol.  BAL less than 10, UDS positive for cocaine, creatinine elevated at 1.28  Upon follow up assessment today:.  Affect is improving, calm, with improved eye contact. He is more  engaged during conversation.  Eye contact is good.  Patient smiles and reports that he is getting better, but needs more time to stabilize.  States that he has been experiencing stressful situations in family,  including losing his loved ones. Reports that this has affected his wellbeing and precipitated his increased use of substances. He reports  that he has always struggled with significant depression and anxiety, but his family situation made it worse.   His thought process is clear, coherent. ,He denies suicidal/homicidal ideations. Denies AVH.   Currently in the dayroom, watching TV. His hygiene is improved.His appetite improved.   Patient reports no concerns about his current medications. He reports feeling supported and safe here.      Patient's symptoms seem to improve as evidenced by his involvement is conversation throughout this assessment, his improved eye contact, his smile, and his denial of  suicidal ideations. He is more focused  with improved  judgement and insight.  He is now able to think about  his healthcare needs in terms aftercare plan. He reports not having any therapy/psychiatric outpatient services and needing referrals. No sign of distress noted.   Patient expresses readiness for discharge, planning to stay at his daughter's house as he is trying to stay away from substance use influences.  We will continue to provide support and encouragements. We will continue current medication regimen as well as safety precautions per unit protocol. Plan is to discharge in AM.    Total Time spent with patient: 30 minutes  Past Psychiatric History: NA  Past Medical History:  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   Depression    Elevated serum creatinine 08/28/2019   Homelessness 06/19/2020   Homicidal ideations 05/25/2023   Hypertension    Major depressive disorder, recurrent episode, 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/11/2022   NSTEMI (non-ST elevated myocardial infarction) (HCC)    Schizoaffective disorder (HCC) 11/08/2022   Status post incision and drainage 04/22/2021   Suicidal ideation 06/05/2020    Past Surgical History:  Procedure Laterality Date  APPENDECTOMY     EYE SURGERY     LEFT HEART CATH AND CORONARY ANGIOGRAPHY N/A 06/05/2020   Procedure: LEFT HEART CATH AND CORONARY ANGIOGRAPHY;  Surgeon: Elder Negus, MD;  Location: [facility] INVASIVE CV [facility];  Service: Cardiovascular;  Laterality: N/A;   PROSTATE SURGERY     Family History:   Family History  Problem Relation Age of Onset   Hypertension Mother    Diabetes Mother    Hypertension Father    Hypertension Sister    Family Psychiatric  History: NA Social History:  Social History   Substance and Sexual Activity  Alcohol Use Yes   Comment: 1 40oz beer about every other day     Social History   Substance and Sexual Activity  Drug Use Yes   Types: Cocaine   Comment: Cocaine about once or twice a month; last use one week ago    Social History   Socioeconomic History   Marital status: Single    Spouse name: Not on file   Number of children: Not on file   Years of education: Not on file   Highest education level: Not on file  Occupational History   Not on file  Tobacco Use   Smoking status: Some Days    Current packs/day: 0.00    Types: Cigarettes    Last attempt to quit: 12/25/2020    Years since quitting: 3.1   Smokeless tobacco: Never   Tobacco comments:    Smokes a couple cigarettes every now and then  Vaping Use   Vaping status: Never Used  Substance and Sexual Activity   Alcohol use: Yes    Comment: 1 40oz beer about every other day   Drug use: Yes    Types: Cocaine    Comment: Cocaine about once or twice a month; last use one week ago   Sexual activity: Not Currently  Other Topics Concern   Not on file  Social History Narrative   His mother passed at age 57 (when he was 35 years old) and his father raised him and his siblings   His parents were pastors as a lot of his sisters and other family members are today   There was a total of 13 children in his family Had 68 sisters, one sister deceased , Had 3 brothers, all brothers deceased   He is the youngest of the 35 and was "always in trouble in my younger years"   Family still live in [place], [place], some in [place], [place]   Has a Daughter and son with a total of 10 grandchildren (6 grand daughters local and 4 grandsons)    Values family   Married twice, present wife incarcerated    Social Drivers of Corporate investment banker Strain: Low Risk  (10/19/2022)   Overall Financial Resource Strain (CARDIA)    Difficulty of Paying Living Expenses: Not hard at all  Food Insecurity: Food Insecurity Present (02/10/2024)   Hunger Vital Sign    Worried About Running Out of Food in the Last Year: Sometimes true    Ran Out of Food in the Last Year: Sometimes true  Transportation Needs: Unmet Transportation Needs (02/10/2024)   PRAPARE - Transportation    Lack of Transportation (Medical): Yes    Lack of Transportation (Non-Medical): Yes  Physical Activity: Inactive (10/19/2022)   Exercise Vital Sign    Days of Exercise per Week: 0 days    Minutes of Exercise per Session: 0 min  Stress: No Stress Concern Present (10/19/2022)   [name] of Occupational Health - Occupational Stress Questionnaire    Feeling of Stress : Not at all  Social Connections: Unknown (02/10/2024)   Social Connection and Isolation Panel [NHANES]    Frequency of Communication with Friends and Family: Twice a week    Frequency of Social Gatherings with Friends and Family: Patient declined    Attends Religious Services: Never    Database administrator or Organizations: No    Attends Engineer, structural: Never    Marital Status: Patient declined   Additional Social History:                         Sleep: Good  Appetite:  Good  Current Medications: Current Facility-Administered Medications  Medication Dose Route Frequency Provider Last Rate Last Admin   acetaminophen (TYLENOL) tablet 650 mg  650 mg Oral Q6H PRN Tingling, Stephanie, PA-C       albuterol (VENTOLIN HFA) 108 (90 Base) MCG/ACT inhaler 2 puff  2 puff Inhalation Q6H PRN Tingling, Stephanie, PA-C       aspirin EC tablet 81 mg  81 mg Oral Daily Tingling, Stephanie, PA-C   81 mg at 02/16/24 0841   atorvastatin (LIPITOR) tablet 80 mg  80 mg Oral Daily Tingling, Stephanie, PA-C   80 mg at 02/16/24 0841    cholecalciferol (VITAMIN D3) 25 MCG (1000 UNIT) tablet 1,000 Units  1,000 Units Oral Daily Tingling, Stephanie, PA-C   1,000 Units at 02/16/24 0841   cyanocobalamin (VITAMIN B12) tablet 1,000 mcg  1,000 mcg Oral Daily Tingling, Stephanie, PA-C   1,000 mcg at 02/16/24 0841   fluticasone furoate-vilanterol (BREO ELLIPTA) 200-25 MCG/ACT 1 puff  1 puff Inhalation Daily Tingling, Stephanie, PA-C   1 puff at 02/16/24 8469   folic acid (FOLVITE) tablet 1 mg  1 mg Oral Daily Tingling, Stephanie, PA-C   1 mg at 02/16/24 0841   hydrOXYzine (ATARAX) tablet 25 mg  25 mg Oral TID PRN Tingling, Stephanie, PA-C       magnesium hydroxide (MILK OF MAGNESIA) suspension 30 mL  30 mL Oral Daily PRN Tingling, Stephanie, PA-C       mirtazapine (REMERON) tablet 7.5 mg  7.5 mg Oral QHS Tingling, Stephanie, PA-C   7.5 mg at 02/15/24 2121   multivitamin with minerals tablet 1 tablet  1 tablet Oral Daily Tingling, Stephanie, PA-C   1 tablet at 02/16/24 0841   OLANZapine (ZYPREXA) injection 5 mg  5 mg Intramuscular TID PRN Tingling, Stephanie, PA-C       OLANZapine zydis (ZYPREXA) disintegrating tablet 5 mg  5 mg Oral TID PRN Tingling, Stephanie, PA-C       sertraline (ZOLOFT) tablet 200 mg  200 mg Oral Daily Tingling, Stephanie, PA-C   200 mg at 02/16/24 0841    Lab Results: No results found for this or any previous visit (from the past 48 hours).  Blood Alcohol level:  Lab Results  Component Value Date   ETH <10 02/09/2024   ETH <10 10/10/2023    Metabolic Disorder Labs: Lab Results  Component Value Date   HGBA1C 5.1 09/15/2023   MPG 99.67 09/15/2023   MPG 103 01/13/2023   No results found for: "PROLACTIN" Lab Results  Component Value Date   CHOL 159 09/15/2023   TRIG 53 09/15/2023   HDL 61 09/15/2023   CHOLHDL 2.6 09/15/2023   VLDL 11 09/15/2023   LDLCALC 87 09/15/2023  LDLCALC 99 01/13/2023    Physical Findings: AIMS:  , ,  ,  ,    CIWA:  CIWA-Ar Total: 0 COWS:     Musculoskeletal: Strength &  Muscle Tone: within normal limits Gait & Station: normal Patient leans: N/A  Psychiatric Specialty Exam:  Presentation  General Appearance:  Appropriate for Environment  Eye Contact: Fair  Speech: Clear and Coherent; Normal Rate  Speech Volume: Normal  Handedness: Right   Mood and Affect  Mood: Euthymic  Affect: Appropriate; Congruent   Thought Process  Thought Processes: Coherent  Descriptions of Associations:Intact  Orientation:Full (Time, Place and Person)  Thought Content:WDL  History of Schizophrenia/Schizoaffective disorder:No  Duration of Psychotic Symptoms:N/A  Hallucinations:Hallucinations: None  Ideas of Reference:None  Suicidal Thoughts:Suicidal Thoughts: No  Homicidal Thoughts:Homicidal Thoughts: No   Sensorium  Memory: Immediate Fair; Recent Fair; Remote Fair  Judgment: Fair  Insight: Fair   Art therapist  Concentration: Fair  Attention Span: Fair  Recall: [employer] of Knowledge: Fair  Language: Fair   Psychomotor Activity  Psychomotor Activity: Psychomotor Activity: Normal   Assets  Assets: Communication Skills; Desire for Improvement; Physical Health   Sleep  Sleep: Sleep: Fair Number of Hours of Sleep: 7    Physical Exam: Physical Exam Vitals and nursing note reviewed.  Constitutional:      Appearance: Normal appearance.  HENT:     Head: Normocephalic.     Right Ear: Tympanic membrane normal.     Left Ear: Tympanic membrane normal.     Nose: Nose normal.     Mouth/Throat:     Mouth: Mucous membranes are moist.  Eyes:     Extraocular Movements: Extraocular movements intact.     Pupils: Pupils are equal, round, and reactive to light.  Cardiovascular:     Rate and Rhythm: Normal rate.     Pulses: Normal pulses.  Pulmonary:     Effort: Pulmonary effort is normal.  Musculoskeletal:        General: Normal range of motion.     Cervical back: Normal range of motion and neck supple.   Neurological:     General: No focal deficit present.     Mental Status: He is alert and oriented to person, place, and time.    Review of Systems  Constitutional: Negative.   HENT: Negative.    Eyes: Negative.   Respiratory: Negative.    Cardiovascular: Negative.   Gastrointestinal: Negative.   Genitourinary: Negative.   Musculoskeletal: Negative.   Skin: Negative.   Neurological: Negative.   Endo/Heme/Allergies: Negative.   Psychiatric/Behavioral: Negative.     Blood pressure (!) 151/86, pulse 63, temperature (!) 97.3 F (36.3 C), resp. rate 16, height 6\' 4"  (1.93 m), weight 111.1 kg, SpO2 100%. Body mass index is 29.82 kg/m.   Treatment Plan Summary: Daily contact with patient to assess and evaluate symptoms and progress in treatment, Medication management, and Plan to discharge in AM  Elston Halsted, NP 02/16/2024, 4:51 PM

## 2024-02-16 NOTE — Group Note (Signed)
 Recreation Therapy Group Note   Group Topic:Coping Skills  Group Date: 02/16/2024 Start Time: 1000 End Time: 1050 Facilitators: Deatrice Factor, LRT, CTRS Location:  Craft Room  Group Description: Mind Map.  Patient was provided a blank template of a diagram with 32 blank boxes in a tiered system, branching from the center (similar to a bubble chart). LRT directed patients to label the middle of the diagram "Coping Skills". LRT and patients then came up with 8 different coping skills as examples. Pt were directed to record their coping skills in the 2nd tier boxes closest to the center.  Patients would then share their coping skills with the group as LRT wrote them out. LRT gave a handout of 99 different coping skills at the end of group.   Goal Area(s) Addressed: Patients will be able to define "coping skills". Patient will identify new coping skills.  Patient will increase communication.   Affect/Mood: Appropriate   Participation Level: Active and Engaged   Participation Quality: Independent   Behavior: Calm and Cooperative   Speech/Thought Process: Coherent   Insight: Fair   Judgement: Fair    Modes of Intervention: Education, Guided Discussion, Worksheet, and Writing   Patient Response to Interventions:  Attentive, Engaged, Interested , and Receptive   Education Outcome:  Acknowledges education   Clinical Observations/Individualized Feedback: Delance was active in their participation of session activities and group discussion. Pt identified "working out, walking, and music" as coping skills. Pt interacted well with LRT and peers duration of session.    Plan: Continue to engage patient in RT group sessions 2-3x/week.   Deatrice Factor, LRT, CTRS 02/16/2024 1:12 PM

## 2024-02-16 NOTE — Progress Notes (Signed)
 Pt calm and pleasant during assessment. Pt is denying HI/AVH. Pt endorses passive SI, verbally contracts for safety. Pt observed interacting appropriately with staff and peers on the unit. Pt compliant with medication administration pre MD orders. Pt given education, support, and encouragement to be active in his treatment plan. Pt being monitored Q 15 minutes for safety per unit protocol, remains safe on the unit

## 2024-02-16 NOTE — Group Note (Signed)
 LCSW Group Therapy Note   Group Date: 02/16/2024 Start Time: 1300 End Time: 1400   Type of Therapy and Topic:  Group Therapy: Challenging Core Beliefs  Participation Level:  Did Not Attend  Description of Group:  Patients were educated about core beliefs and asked to identify one harmful core belief that they have. Patients were asked to explore from where those beliefs originate. Patients were asked to discuss how those beliefs make them feel and the resulting behaviors of those beliefs. They were then be asked if those beliefs are true and, if so, what evidence they have to support them. Lastly, group members were challenged to replace those negative core beliefs with helpful beliefs.   Therapeutic Goals:   1. Patient will identify harmful core beliefs and explore the origins of such beliefs. 2. Patient will identify feelings and behaviors that result from those core beliefs. 3. Patient will discuss whether such beliefs are true. 4.  Patient will replace harmful core beliefs with helpful ones.  Summary of Patient Progress:  Patient did not attend.   Therapeutic Modalities: Cognitive Behavioral Therapy; Solution-Focused Therapy   Darryl Willner M Jaimin Krupka, Milinda Allen 02/16/2024  1:57 PM

## 2024-02-17 NOTE — Progress Notes (Signed)
 [facility] MD Progress Note  02/17/2024 3:54 PM Robert Chan  MRN:  [id number] Subjective:  "If you think I am ready" Principal Problem: Cocaine abuse with cocaine-induced mood disorder (HCC) Diagnosis: Principal Problem:   Cocaine abuse with cocaine-induced mood disorder (HCC) Active Problems:   Cocaine use disorder (HCC)   Robert Chan is a 68 y.o. AA male with a past psychiatric history of MDD, alcohol use disorder, and cocaine use disorder, and medical hx of CAD, GERD, HLD COPD, hypertension, and history of NSTEMI initially presented to [facility], ED with chief complaints of chest pain, patient later endorsed suicidal ideations for the last week since his sister passed away, that this past Mar 22, 2024 he was planning to overdose on pills however his daughter showed up to his home and interrupted him.  Patient was medically cleared as workup was deemed to be unremarkable, diagnosis of nonspecific chest pain, administered Toradol  IV, Robaxin  500 mg.  Reported use of crack cocaine and alcohol.  BAL less than 10, UDS positive for cocaine, creatinine elevated at 1.28  Upon follow up assessment today:.  Patient presents with a sad/flat affect. He is visible in the milieu but appears withdrawn, worried. Alert and oriented. Denies hallucinations. Denies HI. He reports passive SI, reporting that he is unsure how well he is going to do at his current residence. Reports he has been thinking about ending his life whenever he thinks about returning to this place. He reports he was hoping to get some help with placement. Reports his daughter is unable to house him "because whe has too many people in the house, including children who are ill". He reports that he is working on his emotions and hoping to be ready for self-care soon.  He reports no  concerns with his current medications and states safe home is his main concern.      Patient appears less motivated than yesterday. He reports feeling hopeless and helpless.   He is encouraged to continue with group activities and to report his feelings and concerns as needed. Emotional support provided.     Total Time spent with patient: 30 minutes  Past Psychiatric History: MDD, Substance abuse  Past Medical History:  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   Depression    Elevated serum creatinine 08/28/2019   Homelessness 06/19/2020   Homicidal ideations 05/25/2023   Hypertension    Major depressive disorder, recurrent episode, 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/11/2022   NSTEMI (non-ST elevated myocardial infarction) (HCC)    Schizoaffective disorder (HCC) 11/08/2022   Status post incision and drainage 04/22/2021   Suicidal ideation 06/05/2020    Past Surgical History:  Procedure Laterality Date   APPENDECTOMY     EYE SURGERY     LEFT HEART CATH AND CORONARY ANGIOGRAPHY N/A 06/05/2020   Procedure: LEFT HEART CATH AND CORONARY ANGIOGRAPHY;  Surgeon: Cody Das, MD;  Location: [facility] INVASIVE CV LAB;  Service: Cardiovascular;  Laterality: N/A;   PROSTATE SURGERY     Family History:  Family History  Problem Relation Age of Onset   Hypertension Mother    Diabetes Mother    Hypertension Father    Hypertension Sister    Family  Psychiatric  History: NA Social History:  Social History   Substance and Sexual Activity  Alcohol Use Yes   Comment: 1 40oz beer about every other day     Social History   Substance and Sexual Activity  Drug Use Yes   Types: Cocaine   Comment: Cocaine about once or twice a month; last use one week ago    Social History   Socioeconomic History   Marital status: Single    Spouse name: Not on file   Number of children: Not on file   Years of  education: Not on file   Highest education level: Not on file  Occupational History   Not on file  Tobacco Use   Smoking status: Some Days    Current packs/day: 0.00    Types: Cigarettes    Last attempt to quit: 12/25/2020    Years since quitting: 3.1   Smokeless tobacco: Never   Tobacco comments:    Smokes a couple cigarettes every now and then  Vaping Use   Vaping status: Never Used  Substance and Sexual Activity   Alcohol use: Yes    Comment: 1 40oz beer about every other day   Drug use: Yes    Types: Cocaine    Comment: Cocaine about once or twice a month; last use one week ago   Sexual activity: Not Currently  Other Topics Concern   Not on file  Social History Narrative   His mother passed at age 82 (when he was 53 years old) and his father raised him and his siblings   His parents were pastors as a lot of his sisters and other family members are today   There was a total of 13 children in his family Had 16 sisters, one sister deceased , Had 3 brothers, all brothers deceased   He is the youngest of the 79 and was "always in trouble in my younger years"   Family still live in [place], [place], some in [place], [place]   Has a Daughter and son with a total of 10 grandchildren (6 grand daughters local and 4 grandsons)    Values family   Married twice, present wife incarcerated   Social Drivers of Corporate investment banker Strain: Low Risk  (10/19/2022)   Overall Financial Resource Strain (CARDIA)    Difficulty of Paying Living Expenses: Not hard at all  Food Insecurity: Food Insecurity Present (02/10/2024)   Hunger Vital Sign    Worried About Running Out of Food in the Last Year: Sometimes true    Ran Out of Food in the Last Year: Sometimes true  Transportation Needs: Unmet Transportation Needs (02/10/2024)   PRAPARE - Transportation    Lack of Transportation (Medical): Yes    Lack of Transportation (Non-Medical): Yes  Physical Activity: Inactive (10/19/2022)    Exercise Vital Sign    Days of Exercise per Week: 0 days    Minutes of Exercise per Session: 0 min  Stress: No Stress Concern Present (10/19/2022)   [facility] - Occupational Stress Questionnaire    Feeling of Stress : Not at all  Social Connections: Unknown (02/10/2024)   Social Connection and Isolation Panel [NHANES]    Frequency of Communication with Friends and Family: Twice a week    Frequency of Social Gatherings with Friends and Family: Patient declined    Attends Religious Services: Never    Database administrator or Organizations: No    Attends [employer]  or Organization Meetings: Never    Marital Status: Patient declined   Additional Social History:                         Sleep: Good  Appetite:  Good  Current Medications: Current Facility-Administered Medications  Medication Dose Route Frequency Provider Last Rate Last Admin   acetaminophen  (TYLENOL ) tablet 650 mg  650 mg Oral Q6H PRN Tingling, Stephanie, PA-C       albuterol  (VENTOLIN  HFA) 108 (90 Base) MCG/ACT inhaler 2 puff  2 puff Inhalation Q6H PRN Tingling, Stephanie, PA-C       aspirin  EC tablet 81 mg  81 mg Oral Daily Tingling, Stephanie, PA-C   81 mg at 02/17/24 0847   atorvastatin  (LIPITOR ) tablet 80 mg  80 mg Oral Daily Tingling, Stephanie, PA-C   80 mg at 02/17/24 0848   cholecalciferol  (VITAMIN D3) 25 MCG (1000 UNIT) tablet 1,000 Units  1,000 Units Oral Daily Tingling, Stephanie, PA-C   1,000 Units at 02/17/24 0848   cyanocobalamin  (VITAMIN B12) tablet 1,000 mcg  1,000 mcg Oral Daily Tingling, Stephanie, PA-C   1,000 mcg at 02/17/24 0847   fluticasone  furoate-vilanterol (BREO ELLIPTA ) 200-25 MCG/ACT 1 puff  1 puff Inhalation Daily Tingling, Stephanie, PA-C   1 puff at 02/17/24 0847   folic acid  (FOLVITE ) tablet 1 mg  1 mg Oral Daily Tingling, Stephanie, PA-C   1 mg at 02/17/24 0848   hydrOXYzine  (ATARAX ) tablet 25 mg  25 mg Oral TID PRN Tingling, Stephanie, PA-C   25 mg at  02/16/24 2106   magnesium  hydroxide (MILK OF MAGNESIA) suspension 30 mL  30 mL Oral Daily PRN Tingling, Stephanie, PA-C       mirtazapine  (REMERON ) tablet 7.5 mg  7.5 mg Oral QHS Tingling, Stephanie, PA-C   7.5 mg at 02/16/24 2107   multivitamin with minerals tablet 1 tablet  1 tablet Oral Daily Tingling, Stephanie, PA-C   1 tablet at 02/17/24 0848   OLANZapine  (ZYPREXA ) injection 5 mg  5 mg Intramuscular TID PRN Tingling, Stephanie, PA-C       OLANZapine  zydis (ZYPREXA ) disintegrating tablet 5 mg  5 mg Oral TID PRN Tingling, Stephanie, PA-C       sertraline  (ZOLOFT ) tablet 200 mg  200 mg Oral Daily Tingling, Stephanie, PA-C   200 mg at 02/17/24 0847    Lab Results: No results found for this or any previous visit (from the past 48 hours).  Blood Alcohol level:  Lab Results  Component Value Date   ETH <10 02/09/2024   ETH <10 10/10/2023    Metabolic Disorder Labs: Lab Results  Component Value Date   HGBA1C 5.1 09/15/2023   MPG 99.67 09/15/2023   MPG 103 01/13/2023   No results found for: "PROLACTIN" Lab Results  Component Value Date   CHOL 159 09/15/2023   TRIG 53 09/15/2023   HDL 61 09/15/2023   CHOLHDL 2.6 09/15/2023   VLDL 11 09/15/2023   LDLCALC 87 09/15/2023   LDLCALC 99 01/13/2023    Physical Findings: AIMS:  , ,  ,  ,    CIWA:  CIWA-Ar Total: 0 COWS:     Musculoskeletal: Strength & Muscle Tone: within normal limits Gait & Station: normal Patient leans: N/A  Psychiatric Specialty Exam:  Presentation  General Appearance:  Appropriate for Environment  Eye Contact: Fair  Speech: Clear and Coherent  Speech Volume: Normal  Handedness: Right   Mood and Affect  Mood: Anxious; Depressed  Affect: Appropriate   Thought Process  Thought Processes: Coherent  Descriptions of Associations:Intact  Orientation:Full (Time, Place and Person)  Thought Content:WDL  History of Schizophrenia/Schizoaffective disorder:No  Duration of Psychotic  Symptoms:N/A  Hallucinations:Hallucinations: None  Ideas of Reference:None  Suicidal Thoughts:Suicidal Thoughts: Yes, Passive  Homicidal Thoughts:Homicidal Thoughts: No   Sensorium  Memory: Immediate Fair; Recent Fair; Remote Fair  Judgment: Fair  Insight: Fair   Art therapist  Concentration: Fair  Attention Span: Fair  Recall: [employer] of Knowledge: Fair  Language: Fair   Psychomotor Activity  Psychomotor Activity: Psychomotor Activity: Normal   Assets  Assets: Communication Skills; Desire for Improvement; Physical Health   Sleep  Sleep: Sleep: Fair Number of Hours of Sleep: 6    Physical Exam: Physical Exam Vitals and nursing note reviewed.  Constitutional:      Appearance: Normal appearance.  HENT:     Head: Normocephalic and atraumatic.     Right Ear: Tympanic membrane normal.     Left Ear: Tympanic membrane normal.     Nose: Nose normal.     Mouth/Throat:     Mouth: Mucous membranes are moist.  Eyes:     Extraocular Movements: Extraocular movements intact.     Pupils: Pupils are equal, round, and reactive to light.  Cardiovascular:     Rate and Rhythm: Normal rate.     Pulses: Normal pulses.  Pulmonary:     Effort: Pulmonary effort is normal.  Musculoskeletal:        General: Normal range of motion.     Cervical back: Normal range of motion and neck supple.  Neurological:     General: No focal deficit present.     Mental Status: He is alert and oriented to person, place, and time.    Review of Systems  Constitutional: Negative.   HENT: Negative.    Eyes: Negative.   Respiratory: Negative.    Cardiovascular: Negative.   Gastrointestinal: Negative.   Genitourinary: Negative.   Musculoskeletal: Negative.   Skin: Negative.   Neurological: Negative.   Endo/Heme/Allergies: Negative.   Psychiatric/Behavioral:  Positive for depression and suicidal ideas. The patient is nervous/anxious.    Blood pressure 132/84,  pulse 66, temperature (!) 97.4 F (36.3 C), resp. rate 17, height 6\' 4"  (1.93 m), weight 111.1 kg, SpO2 100%. Body mass index is 29.82 kg/m.   Treatment Plan Summary: Daily contact with patient to assess and evaluate symptoms and progress in treatment and Medication management  Elston Halsted, NP 02/17/2024, 3:54 PM

## 2024-02-17 NOTE — Plan of Care (Signed)
  Problem: Education: Goal: Knowledge of [facility] Education information/materials will improve Outcome: Progressing   Problem: Activity: Goal: Interest or engagement in activities will improve Outcome: Progressing Goal: Sleeping patterns

## 2024-02-17 NOTE — Plan of Care (Signed)
   Problem: Education: Goal: Emotional status will improve Outcome: Progressing Goal: Mental status will improve Outcome: Progressing   Problem: Activity: Goal: Sleeping patterns will improve Outcome: Progressing

## 2024-02-17 NOTE — Group Note (Signed)
 Date:  02/17/2024 Time:  8:49 PM  Group Topic/Focus:  Wellness Toolbox:   The focus of this group is to discuss various aspects of wellness, balancing those aspects and exploring ways to increase the ability to experience wellness.  Patients will create a wellness toolbox for use upon discharge.    Participation Level:  Active  Participation Quality:  Appropriate, Attentive, Sharing, and Supportive  Affect:  Appropriate  Cognitive:  Appropriate  Insight: Appropriate  Engagement in Group:  Engaged and Supportive  Modes of Intervention:  Discussion and Support  Additional Comments:     Robert Chan 02/17/2024, 8:49 PM

## 2024-02-17 NOTE — Group Note (Signed)
 Recreation Therapy Group Note   Group Topic:Other  Group Date: 02/17/2024 Start Time: 1005 End Time: 1050 Facilitators: Deatrice Factor, LRT, CTRS Location:  Craft Room  Activity Description/Intervention: Therapeutic Drumming. Patients with peers and staff were given the opportunity to engage in a leader facilitated [facility] Group Empowerment Drumming Circle with staff from the [employer], in partnership with [employer]. Teaching laboratory technician and trained [employer], Kathlyne Parchment leading with LRT observing and documenting intervention and pt response. This evidenced-based practice targets 7 areas of health and wellbeing in the human experience including: stress-reduction, exercise, self-expression, camaraderie/support, nurturing, spirituality, and music-making (leisure).    Goal Area(s) Addresses:  Patient will engage in pro-social way in music group.  Patient will follow directions of drum leader on the first prompt. Patient will demonstrate no behavioral issues during group.  Patient will identify if a reduction in stress level occurs as a result of participation in therapeutic drum circle.     Affect/Mood: Appropriate   Participation Level: Active and Engaged   Participation Quality: Independent   Behavior: Appropriate, Calm, and Cooperative   Speech/Thought Process: Coherent   Insight: Good   Judgement: Good   Modes of Intervention: Music   Patient Response to Interventions:  Attentive, Engaged, Interested , and Receptive   Education Outcome:  Acknowledges education   Clinical Observations/Individualized Feedback: Robert Chan was active in their participation of session activities and group discussion.  Plan: Continue to engage patient in RT group sessions 2-3x/week.   Deatrice Factor, LRT, CTRS 02/17/2024 1:34 PM

## 2024-02-18 DIAGNOSIS — F1414 Cocaine abuse with cocaine-induced mood disorder: Secondary | ICD-10-CM | POA: Diagnosis not present

## 2024-02-18 NOTE — Plan of Care (Signed)
  Problem: Education: Goal: Emotional status will improve Outcome: Progressing Goal: Mental status will improve Outcome: Progressing Goal: Verbalization of understanding the information provided will improve Outcome: Progressing   Problem: Activity: Goal: Interest or engagement in activities will improve Outcome: Progressing   Problem: Coping: Goal: Ability to verbalize frustrations and anger appropriately will improve Outcome: Progressing   Problem: Safety: Goal: Periods of time without injury will increase Outcome: Progressing

## 2024-02-18 NOTE — Progress Notes (Addendum)
 Pt present pleasant, cooperative and engaged in unit activities. Pt is compliant with taking his medications, denies SI, HI, plan or intent and denied AVH. Will continue to monitor on q 15 min rounds.

## 2024-02-18 NOTE — Group Note (Signed)
 Date:  02/18/2024 Time:  9:17 PM  Group Topic/Focus:  Wrap-Up Group:   The focus of this group is to help patients review their daily goal of treatment and discuss progress on daily workbooks.    Participation Level:  Active  Participation Quality:  Appropriate  Affect:  Appropriate  Cognitive:  Alert and Appropriate  Insight: Appropriate  Engagement in Group:  Engaged  Modes of Intervention:  Discussion and Orientation  Additional Comments:     Maglione,Jule Whitsel E 02/18/2024, 9:17 PM

## 2024-02-18 NOTE — Progress Notes (Signed)
   02/17/24 2000  Psych Admission Type (Psych Patients Only)  Admission Status Voluntary  Psychosocial Assessment  Patient Complaints Depression  Eye Contact Fair  Facial Expression Anxious  Affect Depressed  Speech Logical/coherent  Interaction Minimal  Motor Activity Slow  Appearance/Hygiene Unremarkable  Behavior Characteristics Cooperative  Mood Anxious  Thought Process  Coherency WDL  Content WDL  Delusions WDL  Perception WDL  Hallucination None reported or observed  Judgment WDL  Confusion None  Danger to Self  Current suicidal ideation? Denies   Patient alert and oriented x 4, affect is flat but brightens upon approach , he denies SI/HI/AVH 15 minutes safety checks maintained

## 2024-02-18 NOTE — Progress Notes (Signed)
 [facility] MD Progress Note  02/18/2024 10:30 AM Robert Chan  MRN:  [id number] Subjective:  Robert Chan stated " I was not doing good."  Evaluation: Robert Chan 68 year old African-American male presents due to major depressive disorder substance-induced mood disorder and suicidal ideations.  He reports he has been struggling with depression and anxiety since the passing of his sister a few weeks ago.  He reports feeling suicidal with previous suicide attempts 2 years prior.    Robert Chan is rating his depression and anxiety 7 out of 10 with 10 being the worst.  Denies that he is followed by therapy or psychiatry services on an outpatient basis.  Patient is currently prescribed Remeron , Zoloft  and hydroxyzine  for mood stabilization.  He reports he has been taking his medication and tolerating well.  Reports a good appetite.  States he is resting okay throughout the night.  No other documented concerns at this visit.  During evaluation Robert Chan observed resting in bed, he is alert/oriented x 3; calm/cooperative; and mood congruent with affect.  Patient is speaking in a clear tone at moderate volume, and normal pace; with good eye contact.   His thought process is coherent and relevant; There is no indication that he is currently responding to internal/external stimuli or experiencing delusional thought content.    Robert Chan denies suicidal/self-harm/homicidal ideation, psychosis, and paranoia.  Patient has remained calm throughout assessment and has answered questions appropriately.    Principal Problem: Cocaine abuse with cocaine-induced mood disorder (HCC) Diagnosis: Principal Problem:   Cocaine abuse with cocaine-induced mood disorder (HCC) Active Problems:   Cocaine use disorder (HCC)  Total Time spent with patient: 15 minutes  Past Psychiatric History:   Past Medical History:  Past Medical History:  Diagnosis Date   Acute bilateral low back pain without sciatica 02/27/2021   Asthma     CAD (coronary artery disease)    Cataract    CKD (chronic kidney disease)    Cocaine use disorder, mild, abuse (HCC)    COPD (chronic obstructive pulmonary disease) (HCC)    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   Depression    Elevated serum creatinine 08/28/2019   Homelessness 06/19/2020   Homicidal ideations 05/25/2023   Hypertension    Major depressive disorder, recurrent episode, 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/11/2022   NSTEMI (non-ST elevated myocardial infarction) (HCC)    Schizoaffective disorder (HCC) 11/08/2022   Status post incision and drainage 04/22/2021   Suicidal ideation 06/05/2020    Past Surgical History:  Procedure Laterality Date   APPENDECTOMY     EYE SURGERY     LEFT HEART CATH AND CORONARY ANGIOGRAPHY N/A 06/05/2020   Procedure: LEFT HEART CATH AND CORONARY ANGIOGRAPHY;  Surgeon: Cody Das, MD;  Location: [facility];  Service: Cardiovascular;  Laterality: N/A;   PROSTATE SURGERY     Family History:  Family History  Problem Relation Age of Onset   Hypertension Mother    Diabetes Mother    Hypertension Father    Hypertension Sister    Family Psychiatric  History:  Social History:  Social History   Substance and Sexual Activity  Alcohol Use Yes   Comment: 1 40oz beer about every other day     Social History   Substance and Sexual Activity  Drug Use Yes   Types: Cocaine   Comment: Cocaine about once or twice a month; last use one week ago    Social History   Socioeconomic History   Marital status: Single    Spouse name: Not  on file   Number of children: Not on file   Years of education: Not on file   Highest education level: Not on file  Occupational History   Not on file  Tobacco Use   Smoking status: Some Days    Current packs/day: 0.00    Types: Cigarettes    Last attempt to quit: 12/25/2020    Years since quitting: 3.1   Smokeless tobacco: Never   Tobacco comments:    Smokes a couple cigarettes every now and then  Vaping Use    Vaping status: Never Used  Substance and Sexual Activity   Alcohol use: Yes    Comment: 1 40oz beer about every other day   Drug use: Yes    Types: Cocaine    Comment: Cocaine about once or twice a month; last use one week ago   Sexual activity: Not Currently  Other Topics Concern   Not on file  Social History Narrative   His mother passed at age 71 (when he was 63 years old) and his father raised him and his siblings   His parents were pastors as a lot of his sisters and other family members are today   There was a total of 13 children in his family Had 24 sisters, one sister deceased , Had 3 brothers, all brothers deceased   He is the youngest of the 24 and was "always in trouble in my younger years"   Family still live in [place], [place], some in [place], [place]   Has a Daughter and son with a total of 10 grandchildren (6 grand daughters local and 4 grandsons)    Values family   Married twice, present wife incarcerated   Social Drivers of Health   Financial Resource Strain: Low Risk  (10/19/2022)   Overall Financial Resource Strain (CARDIA)    Difficulty of Paying Living Expenses: Not hard at all  Food Insecurity: Food Insecurity Present (02/10/2024)   Hunger Vital Sign    Worried About Running Out of Food in the Last Year: Sometimes true    Ran Out of Food in the Last Year: Sometimes true  Transportation Needs: Unmet Transportation Needs (02/10/2024)   PRAPARE - Transportation    Lack of Transportation (Medical): Yes    Lack of Transportation (Non-Medical): Yes  Physical Activity: Inactive (10/19/2022)   Exercise Vital Sign    Days of Exercise per Week: 0 days    Minutes of Exercise per Session: 0 min  Stress: No Stress Concern Present (10/19/2022)   [name] of Occupational Health - Occupational Stress Questionnaire    Feeling of Stress : Not at all  Social Connections: Unknown (02/10/2024)   Social Connection and Isolation Panel [NHANES]    Frequency of  Communication with Friends and Family: Twice a week    Frequency of Social Gatherings with Friends and Family: Patient declined    Attends Religious Services: Never    Database administrator or Organizations: No    Attends Engineer, structural: Never    Marital Status: Patient declined   Additional Social History:                         Sleep: Fair  Appetite:  Good  Current Medications: Current Facility-Administered Medications  Medication Dose Route Frequency Provider Last Rate Last Admin   acetaminophen  (TYLENOL ) tablet 650 mg  650 mg Oral Q6H PRN Tingling, Trevor Fudge, PA-C  albuterol  (VENTOLIN  HFA) 108 (90 Base) MCG/ACT inhaler 2 puff  2 puff Inhalation Q6H PRN Tingling, Stephanie, PA-C       aspirin  EC tablet 81 mg  81 mg Oral Daily Tingling, Stephanie, PA-C   81 mg at 02/18/24 0913   atorvastatin  (LIPITOR ) tablet 80 mg  80 mg Oral Daily Tingling, Stephanie, PA-C   80 mg at 02/18/24 0913   cholecalciferol  (VITAMIN D3) 25 MCG (1000 UNIT) tablet 1,000 Units  1,000 Units Oral Daily Tingling, Stephanie, PA-C   1,000 Units at 02/18/24 2536   cyanocobalamin  (VITAMIN B12) tablet 1,000 mcg  1,000 mcg Oral Daily Tingling, Stephanie, PA-C   1,000 mcg at 02/18/24 6440   fluticasone  furoate-vilanterol (BREO ELLIPTA ) 200-25 MCG/ACT 1 puff  1 puff Inhalation Daily Tingling, Stephanie, PA-C   1 puff at 02/18/24 0913   folic acid  (FOLVITE ) tablet 1 mg  1 mg Oral Daily Tingling, Stephanie, PA-C   1 mg at 02/18/24 3474   hydrOXYzine  (ATARAX ) tablet 25 mg  25 mg Oral TID PRN Tingling, Stephanie, PA-C   25 mg at 02/16/24 2106   magnesium  hydroxide (MILK OF MAGNESIA) suspension 30 mL  30 mL Oral Daily PRN Tingling, Stephanie, PA-C       mirtazapine  (REMERON ) tablet 7.5 mg  7.5 mg Oral QHS Tingling, Stephanie, PA-C   7.5 mg at 02/17/24 2202   multivitamin with minerals tablet 1 tablet  1 tablet Oral Daily Tingling, Stephanie, PA-C   1 tablet at 02/18/24 2595   OLANZapine  (ZYPREXA )  injection 5 mg  5 mg Intramuscular TID PRN Tingling, Stephanie, PA-C       OLANZapine  zydis (ZYPREXA ) disintegrating tablet 5 mg  5 mg Oral TID PRN Tingling, Stephanie, PA-C       sertraline  (ZOLOFT ) tablet 200 mg  200 mg Oral Daily Tingling, Stephanie, PA-C   200 mg at 02/18/24 6387    Lab Results: No results found for this or any previous visit (from the past 48 hours).  Blood Alcohol level:  Lab Results  Component Value Date   ETH <10 02/09/2024   ETH <10 10/10/2023    Metabolic Disorder Labs: Lab Results  Component Value Date   HGBA1C 5.1 09/15/2023   MPG 99.67 09/15/2023   MPG 103 01/13/2023   No results found for: "PROLACTIN" Lab Results  Component Value Date   CHOL 159 09/15/2023   TRIG 53 09/15/2023   HDL 61 09/15/2023   CHOLHDL 2.6 09/15/2023   VLDL 11 09/15/2023   LDLCALC 87 09/15/2023   LDLCALC 99 01/13/2023    Physical Findings: AIMS:  , ,  ,  ,    CIWA:  CIWA-Ar Total: 0 COWS:     Musculoskeletal: Strength & Muscle Tone: within normal limits Gait & Station:  Resting in bed Patient leans: N/A  Psychiatric Specialty Exam:  Presentation  General Appearance:  Appropriate for Environment  Eye Contact: Good  Speech: Clear and Coherent  Speech Volume: Normal  Handedness: Right   Mood and Affect  Mood: Anxious  Affect: Blunt; Congruent   Thought Process  Thought Processes: Coherent  Descriptions of Associations:Intact  Orientation:Full (Time, Place and Person)  Thought Content:Logical  History of Schizophrenia/Schizoaffective disorder:No  Duration of Psychotic Symptoms:N/A  Hallucinations:Hallucinations: None  Ideas of Reference:None  Suicidal Thoughts:Suicidal Thoughts: No SI Active Intent and/or Plan: Without Intent  Homicidal Thoughts:Homicidal Thoughts: No   Sensorium  Memory: Recent Fair  Judgment: Fair  Insight: Fair   Executive Functions  Concentration: Good  Attention  Span:  Good  Recall: Good  Fund of Knowledge: Fair  Language: Good   Psychomotor Activity  Psychomotor Activity: Psychomotor Activity: Normal   Assets  Assets: Desire for Improvement   Sleep  Sleep: Sleep: Fair Number of Hours of Sleep: 6    Physical Exam: Physical Exam Vitals and nursing note reviewed.  Constitutional:      Appearance: Normal appearance.  Neurological:     Mental Status: He is alert and oriented to person, place, and time.  Psychiatric:        Mood and Affect: Mood normal.        Thought Content: Thought content normal.    Review of Systems  Psychiatric/Behavioral:  Positive for depression, substance abuse and suicidal ideas. The patient is nervous/anxious.   All other systems reviewed and are negative.  Blood pressure 130/82, pulse 73, temperature 98.1 F (36.7 C), resp. rate 17, height 6\' 4"  (1.93 m), weight 111.1 kg, SpO2 100%. Body mass index is 29.82 kg/m.   Treatment Plan Summary: Daily contact with patient to assess and evaluate symptoms and progress in treatment and Medication management  Continue with current treatment plan on 02/18/2024 as listed below except were noted  Substance-induced mood disorder Major depressive disorder Generalized anxiety disorder  Continue Remeron  7.5 mg p.o. nightly Continue Zoloft  200 mg p.o. daily Continue hydroxyzine  25 mg p.o. 3 times daily as needed  CSW to continue working on discharge disposition Patient encouraged to participate in therapeutic milieu  Levester Reagin, NP 02/18/2024, 10:30 AM

## 2024-02-19 DIAGNOSIS — F1414 Cocaine abuse with cocaine-induced mood disorder: Secondary | ICD-10-CM | POA: Diagnosis not present

## 2024-02-19 NOTE — Progress Notes (Addendum)
 [facility] MD Progress Note  02/19/2024 8:17 AM Robert Chan  MRN:  [id number] Subjective:  Robert Chan stated "I'm ok today"  Evaluation: Robert Chan 68 year old African-American male presents due to major depressive disorder substance-induced mood disorder and suicidal ideations.  He reports he has been struggling with depression and anxiety since the passing of his sister a few weeks ago.  He reports feeling suicidal with previous suicide attempts 2 years prior.    Robert Chan is rating his depression and anxiety 6 out of 10 with 10 being the worst.  Patient is currently prescribed Remeron , Zoloft  and hydroxyzine  for mood stabilization.  He reports he has been taking his medication and tolerating well.  Reports a good appetite.  States he is resting okay throughout the night.  No other documented concerns at this visit.  During evaluation Robert Chan observed resting in bed, he is alert/oriented x 3; calm/cooperative; and mood congruent with affect.  Patient is speaking in a clear tone at moderate volume, and normal pace; with good eye contact.   His thought process is coherent and relevant; There is no indication that he is currently responding to internal/external stimuli or experiencing delusional thought content.  Robert Chan denies suicidal/self-harm/homicidal ideation, psychosis, and paranoia.  He clarifies that he experiences suicidal ideation and hallucinations "sometimes, but not yet today."  Patient has remained calm throughout assessment and has answered questions appropriately.    Principal Problem: Cocaine abuse with cocaine-induced mood disorder (HCC) Diagnosis: Principal Problem:   Cocaine abuse with cocaine-induced mood disorder (HCC) Active Problems:   Cocaine use disorder (HCC)  Total Time spent with patient: 15 minutes  Past Psychiatric History:   Past Medical History:  Past Medical History:  Diagnosis Date   Acute bilateral low back pain without sciatica 02/27/2021   Asthma     CAD (coronary artery disease)    Cataract    CKD (chronic kidney disease)    Cocaine use disorder, mild, abuse (HCC)    COPD (chronic obstructive pulmonary disease) (HCC)    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   Depression    Elevated serum creatinine 08/28/2019   Homelessness 06/19/2020   Homicidal ideations 05/25/2023   Hypertension    Major depressive disorder, recurrent episode, 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/11/2022   NSTEMI (non-ST elevated myocardial infarction) (HCC)    Schizoaffective disorder (HCC) 11/08/2022   Status post incision and drainage 04/22/2021   Suicidal ideation 06/05/2020    Past Surgical History:  Procedure Laterality Date   APPENDECTOMY     EYE SURGERY     LEFT HEART CATH AND CORONARY ANGIOGRAPHY N/A 06/05/2020   Procedure: LEFT HEART CATH AND CORONARY ANGIOGRAPHY;  Surgeon: Cody Das, MD;  Location: [facility];  Service: Cardiovascular;  Laterality: N/A;   PROSTATE SURGERY     Family History:  Family History  Problem Relation Age of Onset   Hypertension Mother    Diabetes Mother    Hypertension Father    Hypertension Sister    Family Psychiatric  History:  Social History:  Social History   Substance and Sexual Activity  Alcohol Use Yes   Comment: 1 40oz beer about every other day     Social History   Substance and Sexual Activity  Drug Use Yes   Types: Cocaine   Comment: Cocaine about once or twice a month; last use one week ago    Social History   Socioeconomic History   Marital status: Single    Spouse name: Not on file   Number  of children: Not on file   Years of education: Not on file   Highest education level: Not on file  Occupational History   Not on file  Tobacco Use   Smoking status: Some Days    Current packs/day: 0.00    Types: Cigarettes    Last attempt to quit: 12/25/2020    Years since quitting: 3.1   Smokeless tobacco: Never   Tobacco comments:    Smokes a couple cigarettes every now and then  Vaping Use    Vaping status: Never Used  Substance and Sexual Activity   Alcohol use: Yes    Comment: 1 40oz beer about every other day   Drug use: Yes    Types: Cocaine    Comment: Cocaine about once or twice a month; last use one week ago   Sexual activity: Not Currently  Other Topics Concern   Not on file  Social History Narrative   His mother passed at age 37 (when he was 23 years old) and his father raised him and his siblings   His parents were pastors as a lot of his sisters and other family members are today   There was a total of 13 children in his family Had 33 sisters, one sister deceased , Had 3 brothers, all brothers deceased   He is the youngest of the 68 and was "always in trouble in my younger years"   Family still live in [place], [place], some in [place], [place]   Has a Daughter and son with a total of 10 grandchildren (6 grand daughters local and 4 grandsons)    Values family   Married twice, present wife incarcerated   Social Drivers of Health   Financial Resource Strain: Low Risk  (10/19/2022)   Overall Financial Resource Strain (CARDIA)    Difficulty of Paying Living Expenses: Not hard at all  Food Insecurity: Food Insecurity Present (02/10/2024)   Hunger Vital Sign    Worried About Running Out of Food in the Last Year: Sometimes true    Ran Out of Food in the Last Year: Sometimes true  Transportation Needs: Unmet Transportation Needs (02/10/2024)   PRAPARE - Transportation    Lack of Transportation (Medical): Yes    Lack of Transportation (Non-Medical): Yes  Physical Activity: Inactive (10/19/2022)   Exercise Vital Sign    Days of Exercise per Week: 0 days    Minutes of Exercise per Session: 0 min  Stress: No Stress Concern Present (10/19/2022)   [name] of Occupational Health - Occupational Stress Questionnaire    Feeling of Stress : Not at all  Social Connections: Unknown (02/10/2024)   Social Connection and Isolation Panel [NHANES]    Frequency of  Communication with Friends and Family: Twice a week    Frequency of Social Gatherings with Friends and Family: Patient declined    Attends Religious Services: Never    Database administrator or Organizations: No    Attends Engineer, structural: Never    Marital Status: Patient declined   Additional Social History:                         Sleep: Fair  Appetite:  Good  Current Medications: Current Facility-Administered Medications  Medication Dose Route Frequency Provider Last Rate Last Admin   acetaminophen  (TYLENOL ) tablet 650 mg  650 mg Oral Q6H PRN Tingling, Stephanie, PA-C       albuterol  (VENTOLIN  HFA) 108 (90  Base) MCG/ACT inhaler 2 puff  2 puff Inhalation Q6H PRN Tingling, Trevor Fudge, PA-C       aspirin  EC tablet 81 mg  81 mg Oral Daily Tingling, Stephanie, PA-C   81 mg at 02/18/24 0913   atorvastatin  (LIPITOR ) tablet 80 mg  80 mg Oral Daily Tingling, Stephanie, PA-C   80 mg at 02/18/24 0913   cholecalciferol  (VITAMIN D3) 25 MCG (1000 UNIT) tablet 1,000 Units  1,000 Units Oral Daily Tingling, Stephanie, PA-C   1,000 Units at 02/18/24 0914   cyanocobalamin  (VITAMIN B12) tablet 1,000 mcg  1,000 mcg Oral Daily Tingling, Stephanie, PA-C   1,000 mcg at 02/18/24 9147   fluticasone  furoate-vilanterol (BREO ELLIPTA ) 200-25 MCG/ACT 1 puff  1 puff Inhalation Daily Tingling, Stephanie, PA-C   1 puff at 02/18/24 0913   folic acid  (FOLVITE ) tablet 1 mg  1 mg Oral Daily Tingling, Stephanie, PA-C   1 mg at 02/18/24 8295   hydrOXYzine  (ATARAX ) tablet 25 mg  25 mg Oral TID PRN Tingling, Stephanie, PA-C   25 mg at 02/18/24 2133   magnesium  hydroxide (MILK OF MAGNESIA) suspension 30 mL  30 mL Oral Daily PRN Tingling, Stephanie, PA-C       mirtazapine  (REMERON ) tablet 7.5 mg  7.5 mg Oral QHS Tingling, Stephanie, PA-C   7.5 mg at 02/18/24 2133   multivitamin with minerals tablet 1 tablet  1 tablet Oral Daily Tingling, Stephanie, PA-C   1 tablet at 02/18/24 6213   OLANZapine  (ZYPREXA )  injection 5 mg  5 mg Intramuscular TID PRN Tingling, Stephanie, PA-C       OLANZapine  zydis (ZYPREXA ) disintegrating tablet 5 mg  5 mg Oral TID PRN Tingling, Stephanie, PA-C       sertraline  (ZOLOFT ) tablet 200 mg  200 mg Oral Daily Tingling, Stephanie, PA-C   200 mg at 02/18/24 0865    Lab Results: No results found for this or any previous visit (from the past 48 hours).  Blood Alcohol level:  Lab Results  Component Value Date   ETH <10 02/09/2024   ETH <10 10/10/2023    Metabolic Disorder Labs: Lab Results  Component Value Date   HGBA1C 5.1 09/15/2023   MPG 99.67 09/15/2023   MPG 103 01/13/2023   No results found for: "PROLACTIN" Lab Results  Component Value Date   CHOL 159 09/15/2023   TRIG 53 09/15/2023   HDL 61 09/15/2023   CHOLHDL 2.6 09/15/2023   VLDL 11 09/15/2023   LDLCALC 87 09/15/2023   LDLCALC 99 01/13/2023    Physical Findings: AIMS:  , ,  ,  ,    CIWA:  CIWA-Ar Total: 0 COWS:     Musculoskeletal: Strength & Muscle Tone: within normal limits Gait & Station:  Resting in bed Patient leans: N/A  Psychiatric Specialty Exam:  Presentation  General Appearance:  Appropriate for Environment  Eye Contact: Good  Speech: Clear and Coherent  Speech Volume: Normal  Handedness: Right   Mood and Affect  Mood: Anxious  Affect: Blunt; Congruent   Thought Process  Thought Processes: Coherent  Descriptions of Associations:Intact  Orientation:Full (Time, Place and Person)  Thought Content:Logical  History of Schizophrenia/Schizoaffective disorder:No  Duration of Psychotic Symptoms:N/A  Hallucinations:Hallucinations: None  Ideas of Reference:None  Suicidal Thoughts:Suicidal Thoughts: No SI Active Intent and/or Plan: Without Intent  Homicidal Thoughts:Homicidal Thoughts: No   Sensorium  Memory: Recent Fair  Judgment: Fair  Insight: Fair   Executive Functions  Concentration: Good  Attention  Span: Good  Recall: Good  Fund of Knowledge: Fair  Language: Good   Psychomotor Activity  Psychomotor Activity: Psychomotor Activity: Normal   Assets  Assets: Desire for Improvement   Sleep  Sleep: Sleep: Fair    Physical Exam: Physical Exam Vitals and nursing note reviewed.  Eyes:     Pupils: Pupils are equal, round, and reactive to light.  Pulmonary:     Effort: Pulmonary effort is normal.  Skin:    General: Skin is dry.  Neurological:     Mental Status: He is alert and oriented to person, place, and time.  Psychiatric:        Attention and Perception: Attention and perception normal.        Mood and Affect: Mood normal.        Speech: Speech normal.        Behavior: Behavior is cooperative.        Thought Content: Thought content normal.        Cognition and Memory: Cognition and memory normal.    Review of Systems  Psychiatric/Behavioral:  Positive for depression, substance abuse and suicidal ideas. The patient is nervous/anxious.   All other systems reviewed and are negative.  Blood pressure 139/89, pulse 64, temperature 97.9 F (36.6 C), temperature source Oral, resp. rate 20, height 6\' 4"  (1.93 m), weight 111.1 kg, SpO2 100%. Body mass index is 29.82 kg/m.   Treatment Plan Summary: Daily contact with patient to assess and evaluate symptoms and progress in treatment and Medication management  Continue with current treatment plan on 02/19/2024 as listed below except were noted  Substance-induced mood disorder Major depressive disorder Generalized anxiety disorder  Continue Remeron  7.5 mg p.o. nightly Continue Zoloft  200 mg p.o. daily Continue hydroxyzine  25 mg p.o. 3 times daily as needed  CSW to continue working on discharge disposition Patient encouraged to participate in therapeutic milieu  Jeraline Moment, NP 02/19/2024, 8:17 AM

## 2024-02-19 NOTE — Progress Notes (Signed)
   02/17/24 2000  Psych Admission Type (Psych Patients Only)  Admission Status Voluntary  Psychosocial Assessment  Patient Complaints Depression  Eye Contact Fair  Facial Expression Anxious  Affect Depressed  Speech Logical/coherent  Interaction Minimal  Motor Activity Slow  Appearance/Hygiene Unremarkable  Behavior Characteristics Cooperative  Mood Anxious  Thought Process  Coherency WDL  Content WDL  Delusions WDL  Perception WDL  Hallucination None reported or observed  Judgment WDL  Confusion None  Danger to Self  Current suicidal ideation? Denies   Patient alert and oriented x 4, affect is flat but brightesn upon approach, patient denies SI/HI/AVH

## 2024-02-19 NOTE — Plan of Care (Signed)
   Problem: Education: Goal: Knowledge of [facility] Education information/materials will improve Outcome: Progressing Goal: Emotional status will improve Outcome: Progressing Goal: Mental status will improve Outcome: Progressing Goal: Verbalization of understanding the information provided will improve Outcome: Progressing

## 2024-02-19 NOTE — Plan of Care (Signed)
  Problem: Education: Goal: Mental status will improve Outcome: Progressing   Problem: Education: Goal: Emotional status will improve Outcome: Progressing   Problem: Education: Goal: Verbalization of understanding the information provided will improve Outcome: Progressing

## 2024-02-19 NOTE — Group Note (Signed)
 Date:  02/19/2024 Time:  9:29 PM  Group Topic/Focus:  Wrap-Up Group:   The focus of this group is to help patients review their daily goal of treatment and discuss progress on daily workbooks.    Participation Level:  Active  Participation Quality:  Appropriate  Affect:  Appropriate  Cognitive:  Alert  Insight: Appropriate  Engagement in Group:  Engaged  Modes of Intervention:  Activity and Support  Additional Comments:     Maglione,Anvika Gashi E 02/19/2024, 9:29 PM

## 2024-02-19 NOTE — Progress Notes (Signed)
 Patient endorsed passive SI with no plan. Rating depression and anxiety 8/10 on questionnaire. Reported sleeping well and appetite good. Patient is calm and cooperative. Patient compliant with all medications. Patient interacting appropriately with others on the unit.

## 2024-02-20 DIAGNOSIS — F1414 Cocaine abuse with cocaine-induced mood disorder: Secondary | ICD-10-CM | POA: Diagnosis not present

## 2024-02-20 MED ORDER — MIRTAZAPINE 15 MG PO TABS
15.0000 mg | ORAL_TABLET | Freq: Every day | ORAL | Status: DC
Start: 2024-02-20 — End: 2024-02-23
  Administered 2024-02-20 – 2024-02-22 (×3): 15 mg via ORAL
  Filled 2024-02-20 (×3): qty 1

## 2024-02-20 NOTE — BH IP Treatment Plan (Signed)
 Interdisciplinary Treatment and Diagnostic Plan Update  02/20/2024 Time of Session: 9:00 AM Robert Chan MRN: [id number]  Principal Diagnosis: Cocaine abuse with cocaine-induced mood disorder (HCC)  Secondary Diagnoses: Principal Problem:   Cocaine abuse with cocaine-induced mood disorder (HCC) Active Problems:   Cocaine use disorder (HCC)   Current Medications:  Current Facility-Administered Medications  Medication Dose Route Frequency Provider Last Rate Last Admin   acetaminophen  (TYLENOL ) tablet 650 mg  650 mg Oral Q6H PRN Tingling, Stephanie, PA-C       albuterol  (VENTOLIN  HFA) 108 (90 Base) MCG/ACT inhaler 2 puff  2 puff Inhalation Q6H PRN Tingling, Stephanie, PA-C       aspirin  EC tablet 81 mg  81 mg Oral Daily Tingling, Stephanie, PA-C   81 mg at 02/20/24 0825   atorvastatin  (LIPITOR ) tablet 80 mg  80 mg Oral Daily Tingling, Stephanie, PA-C   80 mg at 02/20/24 0826   cholecalciferol  (VITAMIN D3) 25 MCG (1000 UNIT) tablet 1,000 Units  1,000 Units Oral Daily Tingling, Stephanie, PA-C   1,000 Units at 02/20/24 0825   cyanocobalamin  (VITAMIN B12) tablet 1,000 mcg  1,000 mcg Oral Daily Tingling, Stephanie, PA-C   1,000 mcg at 02/20/24 4098   fluticasone  furoate-vilanterol (BREO ELLIPTA ) 200-25 MCG/ACT 1 puff  1 puff Inhalation Daily Tingling, Stephanie, PA-C   1 puff at 02/20/24 1191   folic acid  (FOLVITE ) tablet 1 mg  1 mg Oral Daily Tingling, Stephanie, PA-C   1 mg at 02/20/24 4782   hydrOXYzine  (ATARAX ) tablet 25 mg  25 mg Oral TID PRN Tingling, Stephanie, PA-C   25 mg at 02/19/24 2142   magnesium  hydroxide (MILK OF MAGNESIA) suspension 30 mL  30 mL Oral Daily PRN Tingling, Stephanie, PA-C       mirtazapine  (REMERON ) tablet 7.5 mg  7.5 mg Oral QHS Tingling, Stephanie, PA-C   7.5 mg at 02/19/24 2139   multivitamin with minerals tablet 1 tablet  1 tablet Oral Daily Tingling, Stephanie, PA-C   1 tablet at 02/20/24 9562   OLANZapine  (ZYPREXA ) injection 5 mg  5 mg Intramuscular TID PRN  Tingling, Stephanie, PA-C       OLANZapine  zydis (ZYPREXA ) disintegrating tablet 5 mg  5 mg Oral TID PRN Tingling, Stephanie, PA-C       sertraline  (ZOLOFT ) tablet 200 mg  200 mg Oral Daily Tingling, Stephanie, PA-C   200 mg at 02/20/24 0825   PTA Medications: Medications Prior to Admission  Medication Sig Dispense Refill Last Dose/Taking   acetaminophen  (TYLENOL ) 325 MG tablet Take 2 tablets (650 mg total) by mouth every 6 (six) hours as needed for mild pain (pain score 1-3).      albuterol  (VENTOLIN  HFA) 108 (90 Base) MCG/ACT inhaler Inhale 2 puffs into the lungs every 6 (six) hours as needed for shortness of breath. 18 g 1    aspirin  EC 81 MG tablet Take 1 tablet (81 mg total) by mouth daily. Swallow whole.      atorvastatin  (LIPITOR ) 80 MG tablet Take 1 tablet (80 mg total) by mouth daily. 90 tablet 3    docusate sodium  (COLACE) 100 MG capsule Take 1 capsule (100 mg total) by mouth 2 (two) times daily. (Patient taking differently: Take 100 mg by mouth daily as needed for mild constipation.) 60 capsule 3    latanoprost  (XALATAN ) 0.005 % ophthalmic solution Place 1 drop into both eyes at bedtime. (Patient not taking: Reported on 12/21/2023)      loratadine  (CLARITIN ) 10 MG tablet Take 1 tablet (10  mg total) by mouth once for 1 dose. (Patient taking differently: Take 10 mg by mouth daily as needed for allergies.)      mometasone -formoterol  (DULERA ) 200-5 MCG/ACT AERO Inhale 2 puffs into the lungs 2 (two) times daily. 13 g 3    senna-docusate (SENOKOT-S) 8.6-50 MG tablet Take 1 tablet by mouth at bedtime as needed for mild constipation. 30 tablet 0    sertraline  (ZOLOFT ) 50 MG tablet Take 1 tablet (50 mg total) by mouth daily. 30 tablet 3     Patient Stressors:    Patient Strengths:    Treatment Modalities: Medication Management, Group therapy, Case management,  1 to 1 session with clinician, Psychoeducation, Recreational therapy.   Physician Treatment Plan for Primary Diagnosis: Cocaine  abuse with cocaine-induced mood disorder (HCC) Long Term Goal(s):     Short Term Goals:    Medication Management: Evaluate patient's response, side effects, and tolerance of medication regimen.  Therapeutic Interventions: 1 to 1 sessions, Unit Group sessions and Medication administration.  Evaluation of Outcomes: Progressing  Physician Treatment Plan for Secondary Diagnosis: Principal Problem:   Cocaine abuse with cocaine-induced mood disorder (HCC) Active Problems:   Cocaine use disorder (HCC)  Long Term Goal(s):     Short Term Goals:       Medication Management: Evaluate patient's response, side effects, and tolerance of medication regimen.  Therapeutic Interventions: 1 to 1 sessions, Unit Group sessions and Medication administration.  Evaluation of Outcomes: Progressing   RN Treatment Plan for Primary Diagnosis: Cocaine abuse with cocaine-induced mood disorder (HCC) Long Term Goal(s): Knowledge of disease and therapeutic regimen to maintain health will improve  Short Term Goals: Ability to remain free from injury will improve, Ability to verbalize frustration and anger appropriately will improve, Ability to demonstrate self-control, Ability to participate in decision making will improve, Ability to verbalize feelings will improve, Ability to disclose and discuss suicidal ideas, Ability to identify and develop effective coping behaviors will improve, and Compliance with prescribed medications will improve     Medication Management: RN will administer medications as ordered by provider, will assess and evaluate patient's response and provide education to patient for prescribed medication. RN will report any adverse and/or side effects to prescribing provider.  Therapeutic Interventions: 1 on 1 counseling sessions, Psychoeducation, Medication administration, Evaluate responses to treatment, Monitor vital signs and CBGs as ordered, Perform/monitor CIWA, COWS, AIMS and Fall Risk  screenings as ordered, Perform wound care treatments as ordered.  Evaluation of Outcomes: Progressing   LCSW Treatment Plan for Primary Diagnosis: Cocaine abuse with cocaine-induced mood disorder (HCC) Long Term Goal(s): Safe transition to appropriate next level of care at discharge, Engage patient in therapeutic group addressing interpersonal concerns.  Short Term Goals: Engage patient in aftercare planning with referrals and resources, Increase social support, Increase ability to appropriately verbalize feelings, Increase emotional regulation, Facilitate acceptance of mental health diagnosis and concerns, Facilitate patient progression through stages of change regarding substance use diagnoses and concerns, Identify triggers associated with mental health/substance abuse issues, and Increase skills for wellness and recovery   Therapeutic Interventions: Assess for all discharge needs, 1 to 1 time with Social worker, Explore available resources and support systems, Assess for adequacy in community support network, Educate family and significant other(s) on suicide prevention, Complete Psychosocial Assessment, Interpersonal group therapy.  Evaluation of Outcomes: Progressing   Progress in Treatment: No. 02/15/23 Update: Yes. And. No. 02/20/24 Update: Yes.  Participating in groups: No. 02/15/23 Update: Yes. And. No. 02/20/24 Update: Yes.  Taking  medication as prescribed: Yes. 02/15/23 Update: Yes. 02/20/24 Update: Yes.  Toleration medication: Yes.02/15/23 Update: Yes. 02/20/24 Update: Yes.  Family/Significant other contact made: No, will contact:  when given permission. 02/15/23 Update: Yes. Education Completed; Alvan Jews, daughter, [phone],  has been identified by the patient as the family member/significant other with whom the patient will be residing, and identified as the person(s) who will aid the patient in the event of a mental health crisis (suicidal ideations/suicide attempt).   With written consent from the patient, the family member/significant other has been provided the following suicide prevention education, prior to the and/or following the discharge of the patient.  Patient understands diagnosis: Yes. 02/20/24 Update: Yes. 02/15/23 Update: Yes. Discussing patient identified problems/goals with staff: Yes.02/15/23 Update: Yes. 02/20/24 Update: Yes.  Medical problems stabilized or resolved: Yes. 02/15/23 Update: Yes. 02/20/24 Update: Yes.  Denies suicidal/homicidal ideation: No. 02/15/23 Update: Yes. 02/20/24 Update: Yes.  Issues/concerns per patient self-inventory: No. 02/15/23 Update: No. 02/20/24 Update: No.  Other: none. 02/15/23 Update: None. 02/20/24 Update: None.   New problem(s) identified:  No, Describe:  none identified. 02/15/23 Update: No, Describe:  none identified. 02/20/24 Update:  No, Describe:  none identified.  New Short Term/Long Term Goal(s):  detox, medication management for mood stabilization; elimination of SI thoughts; development of comprehensive mental wellness/sobriety plan. 02/15/23 Update: Goal to remain the same.  02/20/24 Update: Goal to remain the same.   Patient Goals: "To get better."   02/20/24 Update: Goal to remain the same.    Discharge Plan or Barriers:  CSW will assist pt with development of an appropriate aftercare/discharge plan. 02/15/23 Update: CSW goal to remain the same.   02/20/24 Update: CSW goal to remain the same.   Reason for Continuation of Hospitalization:  Depression Medication stabilization Suicidal ideation Withdrawal symptoms  Estimated Length of Stay: 02/20/24 Update: TBD  Last 3 [place] Suicide Severity Risk Score: Flowsheet Row Admission (Current) from 02/10/2024 in [facility] ED from 02/09/2024 in [facility] ED from 11/07/2023 in [facility]  C-SSRS RISK CATEGORY Low Risk No Risk No Risk       Last PHQ  2/9 Scores:    12/29/2023    4:08 PM 11/09/2023    8:38 AM 09/21/2023    2:39 PM  Depression screen PHQ 2/9  Decreased Interest 2 1 1   Down, Depressed, Hopeless 1 2 0  PHQ - 2 Score 3 3 1   Altered sleeping 0 1   Tired, decreased energy 1 2   Change in appetite 0 1   Feeling bad or failure about yourself  1 1   Trouble concentrating 1 0   Moving slowly or fidgety/restless 1 2   Suicidal thoughts 0 1   PHQ-9 Score 7 11     Scribe for Treatment Team: Kegan Shepardson M Elbert Spickler, LCSW 02/20/2024 11:12 AM

## 2024-02-20 NOTE — Plan of Care (Signed)
  Problem: Education: Goal: Mental status will improve Outcome: Progressing   Problem: Health Behavior/Discharge Planning: Goal: Identification of resources available to assist in meeting health care needs will improve Outcome: Progressing Goal: Compliance with treatment plan for underlying cause of condition will improve Outcome: Progressing   Problem: Health Behavior/Discharge Planning: Goal: Compliance with treatment plan for underlying cause of condition will improve Outcome: Progressing   Problem: Safety: Goal: Periods of time without injury will increase Outcome: Progressing
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
## 2024-02-20 NOTE — Plan of Care (Signed)
  Problem: Education: Goal: Emotional status will improve Outcome: Progressing   Problem: Education: Goal: Mental status will improve Outcome: Progressing   Problem: Education: Goal: Verbalization of understanding the information provided will improve Outcome: Progressing   

## 2024-02-20 NOTE — Group Note (Signed)
 [facility] LCSW Group Therapy Note    Group Date: 02/20/2024 Start Time: 1300 End Time: 1400  Type of Therapy and Topic:  Group Therapy:  Overcoming Obstacles  Participation Level:  [facility] PARTICIPATION LEVEL: None   Description of Group:   In this group patients will be encouraged to explore what they see as obstacles to their own wellness and recovery. They will be guided to discuss their thoughts, feelings, and behaviors related to these obstacles. The group will process together ways to cope with barriers, with attention given to specific choices patients can make. Each patient will be challenged to identify changes they are motivated to make in order to overcome their obstacles. This group will be process-oriented, with patients participating in exploration of their own experiences as well as giving and receiving support and challenge from other group members.  Therapeutic Goals: 1. Patient will identify personal and current obstacles as they relate to admission. 2. Patient will identify barriers that currently interfere with their wellness or overcoming obstacles.  3. Patient will identify feelings, thought process and behaviors related to these barriers. 4. Patient will identify two changes they are willing to make to overcome these obstacles:    Summary of Patient Progress Patient was present for the entirety of the group process. However, he did not participate in the discussion outside of icebreaker.   Therapeutic Modalities:   Cognitive Behavioral Therapy Solution Focused Therapy Motivational Interviewing Relapse Prevention Therapy   Randolm Butte, LCSW

## 2024-02-20 NOTE — Progress Notes (Signed)
   02/20/24 0826  Psych Admission Type (Psych Patients Only)  Admission Status Voluntary  Psychosocial Assessment  Patient Complaints Depression  Eye Contact Fair  Facial Expression Flat  Affect Depressed  Speech Logical/coherent  Interaction Minimal  Motor Activity Slow  Appearance/Hygiene Unremarkable  Behavior Characteristics Cooperative;Appropriate to situation  Mood Pleasant;Depressed  Thought Process  Coherency WDL  Content WDL  Delusions None reported or observed  Perception WDL  Hallucination None reported or observed  Judgment WDL  Confusion None  Danger to Self  Current suicidal ideation? Denies  Agreement Not to Harm Self Yes  Description of Agreement verbal  Danger to Others  Danger to Others None reported or observed

## 2024-02-20 NOTE — Group Note (Signed)
 Recreation Therapy Group Note   Group Topic:Healthy Support Systems  Group Date: 02/20/2024 Start Time: 1035 End Time: 1130 Facilitators: Yvonna Herder, CTRS Location: Craft Room  Group Description: Straw Bridge. In groups or individually, patients were given 10 plastic drinking straws and an equal length of masking tape. Using the materials provided, patients were instructed to build a free-standing bridge-like structure to suspend an everyday item (ex: deck of cards) off the floor or table surface. All materials were required to be used in Secondary school teacher. LRT facilitated post-activity discussion reviewing the importance of having strong and healthy support systems in our lives. LRT discussed how the people in our lives serve as the tape and the deck of cards we placed on top of our straw structure are the stressors we face in daily life. LRT and pts discussed what happens in our life when things get too heavy for us , and we don't have strong supports outside of the hospital. Pt shared 2 of their healthy supports in their life aloud in the group.   Goal Area(s) Addressed:  Patient will identify 2 healthy supports in their life. Patient will identify skills to successfully complete activity. Patient will identify correlation of this activity to life post-discharge.  Patient will build on frustration tolerance skills. Patient will increase team building and communication skills.    Affect/Mood: Appropriate and Flat   Participation Level: Active and Engaged   Participation Quality: Independent   Behavior: Calm and Cooperative   Speech/Thought Process: Coherent   Insight: Fair   Judgement: Fair    Modes of Intervention: Education, Exploration, Group work, and Support   Patient Response to Interventions:  Attentive, Engaged, and Receptive   Education Outcome:  Acknowledges education   Clinical Observations/Individualized Feedback: Lawerance was active in their participation of  session activities and group discussion. Pt identified "exercise and my family" as healthy supports. Pt interacted well with LRT and peers duration of session.    Plan: Continue to engage patient in RT group sessions 2-3x/week.   Deatrice Factor, LRT, CTRS 02/20/2024 11:36 AM

## 2024-02-20 NOTE — Group Note (Signed)
 Date:  02/20/2024 Time:  10:48 PM  Group Topic/Focus:  Wrap-Up Group:   The focus of this group is to help patients review their daily goal of treatment and discuss progress on daily workbooks.    Participation Level:  Active  Participation Quality:  Appropriate  Affect:  Appropriate  Cognitive:  Appropriate  Insight: Appropriate  Engagement in Group:  Engaged  Modes of Intervention:  Discussion   Robert Chan 02/20/2024, 10:48 PM

## 2024-02-20 NOTE — Group Note (Signed)
 Recreation Therapy Group Note   Group Topic:General Recreation  Group Date: 02/20/2024 Start Time: 1500 End Time: 1600 Facilitators: Deatrice Factor, LRT, CTRS Location: Courtyard  Group Description: [employer]. LRT and patients played games of basketball, drew with chalk, and played corn hole while outside in the courtyard while getting fresh air and sunlight. Music was being played in the background. LRT and peers conversed about different games they have played before, what they do in their free time and anything else that is on their minds. LRT encouraged pts to drink water after being outside, sweating and getting their heart rate up.  Goal Area(s) Addressed: Patient will build on frustration tolerance skills. Patients will partake in a competitive play game with peers. Patients will gain knowledge of new leisure interest/hobby.    Affect/Mood: Appropriate   Participation Level: Active   Participation Quality: Independent   Behavior: Appropriate   Speech/Thought Process: Coherent   Insight: Fair   Judgement: Fair    Modes of Intervention: Activity   Patient Response to Interventions:  Receptive   Education Outcome:  Acknowledges education   Clinical Observations/Individualized Feedback: Jr was active in their participation of session activities and group discussion. Pt interacted well with LRT and peers duration of session.    Plan: Continue to engage patient in RT group sessions 2-3x/week.   Deatrice Factor, LRT, CTRS 02/20/2024 5:40 PM

## 2024-02-20 NOTE — Progress Notes (Signed)
   02/19/24 2000  Psych Admission Type (Psych Patients Only)  Admission Status Voluntary  Psychosocial Assessment  Patient Complaints Depression;Self-harm thoughts  Eye Contact Fair  Facial Expression Anxious  Affect Depressed  Speech Logical/coherent  Interaction Minimal  Motor Activity Slow  Appearance/Hygiene Unremarkable  Behavior Characteristics Cooperative;Appropriate to situation  Mood Pleasant;Depressed  Thought Process  Coherency WDL  Content WDL  Delusions WDL  Perception WDL  Hallucination None reported or observed  Judgment WDL  Confusion None  Danger to Self  Current suicidal ideation? Denies  Danger to Others  Danger to Others None reported or observed   Patient alert and oriented x 4 , he endorsed passive SI but contracts for safety, he is interacting appropriately with peers and staff, emotional support offered. 15 minutes safety checks maintained will continue to monitor.

## 2024-02-20 NOTE — Group Note (Signed)
 Date:  02/20/2024 Time:  4:47 PM  Group Topic/Focus:  Building Self Esteem:   The Focus of this group is helping patients become aware of the effects of self-esteem on their lives, the things they and others do that enhance or undermine their self-esteem, seeing the relationship between their level of self-esteem and the choices they make and learning ways to enhance self-esteem.    Participation Level:  Active  Participation Quality:  Appropriate  Affect:  Appropriate  Cognitive:  Appropriate  Insight: Appropriate  Engagement in Group:  Engaged  Modes of Intervention:  Activity  Additional Comments:    Marianna Shirk Danyela Posas 02/20/2024, 4:47 PM

## 2024-02-21 DIAGNOSIS — F1414 Cocaine abuse with cocaine-induced mood disorder: Secondary | ICD-10-CM | POA: Diagnosis not present

## 2024-02-21 NOTE — Progress Notes (Signed)
   02/21/24 1000  Psych Admission Type (Psych Patients Only)  Admission Status Voluntary  Psychosocial Assessment  Patient Complaints Disorientation  Eye Contact Fair  Facial Expression Flat  Affect Labile;Flat;Anxious  Speech Logical/coherent  Interaction Minimal  Motor Activity Slow  Appearance/Hygiene Unremarkable  Behavior Characteristics Cooperative  Mood Pleasant  Thought Process  Coherency WDL  Content WDL  Delusions None reported or observed  Perception WDL  Hallucination None reported or observed  Judgment WDL  Confusion None  Danger to Self  Current suicidal ideation? Denies  Agreement Not to Harm Self Yes  Description of Agreement verbal  Danger to Others  Danger to Others None reported or observed

## 2024-02-21 NOTE — Plan of Care (Signed)
   Problem: Education: Goal: Knowledge of [facility] Education information/materials will improve Outcome: Progressing Goal: Emotional status will improve Outcome: Progressing Goal: Mental status will improve Outcome: Progressing Goal: Verbalization of understanding the information provided will improve Outcome: Progressing

## 2024-02-21 NOTE — Plan of Care (Signed)
  Problem: Education: Goal: Knowledge of [facility] Education information/materials will improve Outcome: Progressing Goal: Mental status will improve Outcome: Progressing   Problem: Activity: Goal: Interest or engagement in activities will improve Outcome: Progressing Goal: Sleeping patterns will improve Outcome: Progressing   Problem: Coping: Goal: Ability to verbalize frustrations and anger appropriately will improve Outcome: Progressing   Problem: Safety: Goal: Periods of time without injury will increase Outcome: Progressing   Problem: Safety: Goal: Periods of time without injury will increase Outcome: Progressing

## 2024-02-21 NOTE — Progress Notes (Signed)
 Pt calm and pleasant during assessment. Pt is denying SI/HI/AVH. Pt observed interacting appropriately with staff and peers on the unit. Pt compliant with medication administration pre MD orders. Pt given education, support, and encouragement to be active in his treatment plan. Pt being monitored Q 15 minutes for safety per unit protocol, remains safe on the unit

## 2024-02-21 NOTE — Group Note (Signed)
 Date:  02/21/2024 Time:  9:54 AM  Group Topic/Focus:  Goals Group:   The focus of this group is to help patients establish daily goals to achieve during treatment and discuss how the patient can incorporate goal setting into their daily lives to aide in recovery.    Participation Level:  Did Not Attend   Mellie Sprinkle [facility] 02/21/2024, 9:54 AM

## 2024-02-21 NOTE — Group Note (Signed)
 Recreation Therapy Group Note   Group Topic:Goal Setting  Group Date: 02/21/2024 Start Time: 1000 End Time: 1100 Facilitators: Deatrice Factor, LRT, CTRS Location:  Craft Room  Group Description: Product/process development scientist. Patients were given many different magazines, a glue stick, markers, and a piece of cardstock paper. LRT and pts discussed the importance of having goals in life. LRT and pts discussed the difference between short-term and long-term goals, as well as what a SMART goal is. LRT encouraged pts to create a vision board, with images they picked and then cut out with safety scissors from the magazine, for themselves, that capture their short and long-term goals. LRT encouraged pts to show and explain their vision board to the group.   Goal Area(s) Addressed:  Patient will gain knowledge of short vs. long term goals.  Patient will identify goals for themselves. Patient will practice setting SMART goals. Patient will verbalize their goals to LRT and peers.   Affect/Mood: N/A   Participation Level: Did not attend    Clinical Observations/Individualized Feedback: Patient did not attend group.   Plan: Continue to engage patient in RT group sessions 2-3x/week.   [id number], LRT, CTRS 02/21/2024 12:11 PM

## 2024-02-21 NOTE — Group Note (Signed)
 Date:  02/21/2024 Time:  10:39 PM  Group Topic/Focus:  Wrap-Up Group:   The focus of this group is to help patients review their daily goal of treatment and discuss progress on daily workbooks.    Participation Level:  Active  Participation Quality:  Appropriate and Attentive  Affect:  Appropriate and Flat  Cognitive:  Alert  Insight: Appropriate  Engagement in Group:  Engaged  Modes of Intervention:  Discussion and Orientation  Additional Comments:     Maglione,Hisayo Delossantos E 02/21/2024, 10:39 PM

## 2024-02-21 NOTE — Group Note (Signed)
 Recreation Therapy Group Note   Group Topic:Stress Management  Group Date: 02/21/2024 Start Time: 1500 End Time: 1600 Facilitators: Deatrice Factor, LRT, CTRS Location: Courtyard  Group Description: [employer]. LRT and patients played games of basketball, drew with chalk, and played corn hole while outside in the courtyard while getting fresh air and sunlight. Music was being played in the background. LRT and peers conversed about different games they have played before, what they do in their free time and anything else that is on their minds. LRT encouraged pts to drink water after being outside, sweating and getting their heart rate up.  Goal Area(s) Addressed: Patient will build on frustration tolerance skills. Patients will partake in a competitive play game with peers. Patients will gain knowledge of new leisure interest/hobby.    Affect/Mood: Flat   Participation Level: Active   Participation Quality: Independent   Behavior: Appropriate   Speech/Thought Process: Coherent   Insight: Fair   Judgement: Fair    Modes of Intervention: Activity   Patient Response to Interventions:  Receptive   Education Outcome:  Acknowledges education   Clinical Observations/Individualized Feedback: Robert Chan was active in their participation of session activities and group discussion. Pt interacted well with LRT and peers duration of session.    Plan: Continue to engage patient in RT group sessions 2-3x/week.   Deatrice Factor, LRT, CTRS 02/21/2024 5:08 PM

## 2024-02-21 NOTE — Progress Notes (Signed)
 No concerns or complaints voiced. Endorses passive Si, contracts for safety. Visible in milieu socializing with peers. Encouragement and support provided. Safety checks maintained. Medications given as prescribed. Pt receptive and remains safe on unit with q 15 min checks.
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
## 2024-02-22 DIAGNOSIS — F1414 Cocaine abuse with cocaine-induced mood disorder: Secondary | ICD-10-CM | POA: Diagnosis not present

## 2024-02-22 NOTE — Progress Notes (Signed)
   02/22/24 1000  Psych Admission Type (Psych Patients Only)  Admission Status Voluntary  Psychosocial Assessment  Patient Complaints Anxiety;Hopelessness;Depression (Patient does report his anxiety and depression is "better" today)  Eye Contact Fair  Facial Expression Flat  Affect Flat  Speech Logical/coherent  Interaction Minimal  Motor Activity Slow  Appearance/Hygiene Unremarkable  Behavior Characteristics Cooperative  Mood Pleasant;Sad;Depressed (Patient reports his goal for today is "getting better" and that  he will do "everything" to help him reach that goal.)  Thought Process  Coherency WDL  Content WDL  Delusions None reported or observed  Perception WDL  Hallucination None reported or observed  Judgment WDL  Confusion None  Danger to Self  Current suicidal ideation? Denies  Agreement Not to Harm Self Yes  Description of Agreement Verbal  Danger to Others  Danger to Others None reported or observed

## 2024-02-22 NOTE — Plan of Care (Signed)
   Problem: Education: Goal: Emotional status will improve Outcome: Progressing Goal: Mental status will improve Outcome: Progressing   Problem: Activity: Goal: Sleeping patterns will improve Outcome: Progressing   Problem: Coping: Goal: Ability to verbalize frustrations and anger appropriately will improve Outcome: Progressing   Problem: Safety: Goal: Periods of time without injury will increase Outcome: Progressing

## 2024-02-22 NOTE — Progress Notes (Signed)
 Pt calm and pleasant during assessment. Pt is denying SI/HI/AVH. Pt observed interacting appropriately with staff and peers on the unit. Pt compliant with medication administration pre MD orders. Pt given education, support, and encouragement to be active in his treatment plan. Pt being monitored Q 15 minutes for safety per unit protocol, remains safe on the unit
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
## 2024-02-22 NOTE — Group Note (Unsigned)
 Date:  02/22/2024 Time:  10:45 PM  Group Topic/Focus:  Coping With Mental Health Crisis:   The purpose of this group is to help patients identify strategies for coping with mental health crisis.  Group discusses possible causes of crisis and ways to manage them effectively.     Participation Level:  {BHH PARTICIPATION VWUJW:11914}  Participation Quality:  {BHH PARTICIPATION QUALITY:22265}  Affect:  {BHH AFFECT:22266}  Cognitive:  {BHH COGNITIVE:22267}  Insight: {BHH Insight2:20797}  Engagement in Group:  {BHH ENGAGEMENT IN NWGNF:62130}  Modes of Intervention:  {BHH MODES OF INTERVENTION:22269}  Additional Comments:  ***  Domenic Schoenberger 02/22/2024, 10:45 PM

## 2024-02-22 NOTE — Plan of Care (Signed)
   Problem: Education: Goal: Emotional status will improve Outcome: Progressing Goal: Mental status will improve Outcome: Progressing   Problem: Activity: Goal: Sleeping patterns will improve Outcome: Progressing

## 2024-02-23 DIAGNOSIS — F1414 Cocaine abuse with cocaine-induced mood disorder: Secondary | ICD-10-CM | POA: Diagnosis not present

## 2024-02-23 MED ORDER — VITAMIN D3 25 MCG PO TABS
1000.0000 [IU] | ORAL_TABLET | Freq: Every day | ORAL | 0 refills | Status: AC
Start: 2024-02-24 — End: ?

## 2024-02-23 MED ORDER — FOLIC ACID 1 MG PO TABS: 1.0000 mg | ORAL_TABLET | Freq: Every day | ORAL | 0 refills | Status: AC

## 2024-02-23 MED ORDER — SERTRALINE HCL 100 MG PO TABS: 200.0000 mg | ORAL_TABLET | Freq: Every day | ORAL | 0 refills | Status: DC

## 2024-02-23 MED ORDER — HYDROXYZINE HCL 25 MG PO TABS: 25.0000 mg | ORAL_TABLET | Freq: Three times a day (TID) | ORAL | 0 refills | Status: DC | PRN

## 2024-02-23 MED ORDER — CYANOCOBALAMIN 1000 MCG PO TABS
1000.0000 ug | ORAL_TABLET | Freq: Every day | ORAL | 0 refills | Status: AC
Start: 2024-02-24 — End: ?

## 2024-02-23 MED ORDER — MIRTAZAPINE 15 MG PO TABS: 15.0000 mg | ORAL_TABLET | Freq: Every day | ORAL | 0 refills | Status: DC

## 2024-02-23 NOTE — Plan of Care (Signed)
   Problem: Education: Goal: Emotional status will improve Outcome: Progressing Goal: Mental status will improve Outcome: Progressing   Problem: Activity: Goal: Sleeping patterns will improve Outcome: Progressing

## 2024-02-23 NOTE — Discharge Summary (Signed)
 Physician Discharge Summary Note  Patient:  Robert Chan is an 68 y.o., male MRN:  [id number] DOB:  1955/11/21 Patient phone:  [phone] (home)  Patient address:   [address] [place] [address],  Total Time spent with patient: 1.5 hours  Date of Admission:  02/10/2024 Date of Discharge: 02/23/2024  Reason for Admission:  68 y.o. AA male with a past psychiatric history of MDD, alcohol use disorder, and cocaine use disorder, and medical hx of CAD, GERD, HLD COPD, hypertension, and history of NSTEMI initially presented to [name] ED with chief complaints of chest pain, patient later endorsed suicidal ideations for the last week since his sister passed away, that this past Mar 22, 2024 he was planning to overdose on pills however his daughter showed up to his home and interrupted him.  Patient was medically cleared as workup was deemed to be unremarkable, diagnosis of nonspecific chest pain, administered Toradol  IV, Robaxin  500 mg.  Reported use of crack cocaine and alcohol.  BAL less than 10, UDS positive for cocaine, creatinine elevated at 1.28   Principal Problem: Cocaine abuse with cocaine-induced mood disorder [facility]) Discharge Diagnoses: Principal Problem:   Cocaine abuse with cocaine-induced mood disorder (HCC) Active Problems:   Cocaine use disorder [facility])   Past Psychiatric History: see H&P  Past Medical History:  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   Depression    Elevated serum creatinine 08/28/2019   Homelessness 06/19/2020   Homicidal ideations 05/25/2023   Hypertension    Major depressive disorder, recurrent episode, 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/11/2022   NSTEMI (non-ST elevated  myocardial infarction) (HCC)    Schizoaffective disorder (HCC) 11/08/2022   Status post incision and drainage 04/22/2021   Suicidal ideation 06/05/2020    Past Surgical History:  Procedure Laterality Date   APPENDECTOMY     EYE SURGERY     LEFT HEART CATH AND CORONARY ANGIOGRAPHY N/A 06/05/2020   Procedure: LEFT HEART CATH AND CORONARY ANGIOGRAPHY;  Surgeon: Cody Das, MD;  Location: [facility] IN[facility];  Service: Cardiovascular;  Laterality: N/A;   PROSTATE SURGERY     Family History:  Family History  Problem Relation Age of Onset   Hypertension Mother    Diabetes Mother    Hypertension Father    Hypertension Sister    Family Psychiatric  History: see H&P Social History:  Social History   Substance and Sexual Activity  Alcohol Use Yes   Comment: 1 40oz beer about every other day     Social History   Substance and Sexual Activity  Drug Use Yes   Types: Cocaine   Comment: Cocaine about once or twice a month; last use one week ago    Social History   Socioeconomic History   Marital status: Single    Spouse name: Not on file   Number of children: Not on file   Years of education: Not on file   Highest education level: Not on file  Occupational History   Not on file  Tobacco Use   Smoking status: Some Days    Current packs/day: 0.00    Types: Cigarettes    Last attempt to quit: 12/25/2020  Years since quitting: 3.1   Smokeless tobacco: Never   Tobacco comments:    Smokes a couple cigarettes every now and then  Vaping Use   Vaping status: Never Used  Substance and Sexual Activity   Alcohol use: Yes    Comment: 1 40oz beer about every other day   Drug use: Yes    Types: Cocaine    Comment: Cocaine about once or twice a month; last use one week ago   Sexual activity: Not Currently  Other Topics Concern   Not on file  Social History Narrative   His mother passed at age 40 (when he was 47 years old) and his father raised him and his siblings   His  parents were pastors as a lot of his sisters and other family members are today   There was a total of 13 children in his family Had 94 sisters, one sister deceased , Had 3 brothers, all brothers deceased   He is the youngest of the 17 and was "always in trouble in my younger years"   Family still live in [place], [place], some in [place], [place]   Has a Daughter and son with a total of 10 grandchildren (6 grand daughters local and 4 grandsons)    Values family   Married twice, present wife incarcerated   Social Drivers of Corporate investment banker Strain: Low Risk  (10/19/2022)   Overall Financial Resource Strain (CARDIA)    Difficulty of Paying Living Expenses: Not hard at all  Food Insecurity: Food Insecurity Present (02/10/2024)   Hunger Vital Sign    Worried About Running Out of Food in the Last Year: Sometimes true    Ran Out of Food in the Last Year: Sometimes true  Transportation Needs: Unmet Transportation Needs (02/10/2024)   PRAPARE - Transportation    Lack of Transportation (Medical): Yes    Lack of Transportation (Non-Medical): Yes  Physical Activity: Inactive (10/19/2022)   Exercise Vital Sign    Days of Exercise per Week: 0 days    Minutes of Exercise per Session: 0 min  Stress: No Stress Concern Present (10/19/2022)   [name] of Occupational Health - Occupational Stress Questionnaire    Feeling of Stress : Not at all  Social Connections: Unknown (02/10/2024)   Social Connection and Isolation Panel [NHANES]    Frequency of Communication with Friends and Family: Twice a week    Frequency of Social Gatherings with Friends and Family: Patient declined    Attends Religious Services: Never    Database administrator or Organizations: No    Attends Engineer, structural: Never    Marital Status: Patient declined    Hospital Course:   During the course of hospitalization, pt received daily multiple modalities of treatments consisting of  Psychopharmacology, individual, group, psychoeducational, recreational, milieu therapy, including case management to coordinate pts inpatient and outpatient care and in concert with weekly treatment team meetings. Discharge planning was initiated on the day of admission to ensure a safe discharge. The presenting symptoms were closely monitored and medications were started as indicated. There were no complications. The principal reasons for hospitalization consisted of substance use disorder, MDD, SI  Medications addressing the principal problem were initiated with improvement in severity sufficient to discharge to a lower level of care. Patient was started on Zoloft  50 mg daily for MDD/anxiety did look up titrated to 200 mg daily.  Patient continued to endorse depressed mood, SI at which point  Remeron  7.5 mg at bedtime was initiated, up titrated to 50 mg every at bedtime.  Patient's vitamin D  was found to be low, was started on vitamin D3 25 mcg daily.  Patient was also ordered hydroxyzine  25 mg 3 times daily as needed for anxiety  It is intended for the outpatient provider to determine whether to continue these medications, or if these medication needs to be titrated for continued outpatient therapy.  All identified psychiatric, general medical/surgical psychosocial obstacles to discharge were addressed. Patient tolerated these medications with no noted side effects. All these medications were titrated to discharge levels (Please see discharge medications below). Patient showed slow but steady and sustained symptomatic improvement before discharge. The patient denied suicidal, homicidal ideations and hallucinations. Family session held to determine baseline behaviors and for safe discharge plan.  On the day of discharge 02/23/2024, following sustained improvement in the affect of this patient, repeated denial of suicidal, homicidal and other violent ideations, though he does continue to endorse manageable  depression and anxiety, adequate interaction with peers, active participation in groups while on the unit, and denial of adverse reactions from the medications, the treatment team decided that Claris Crook was stable for discharge back  home with scheduled mental health treatment as below. A comprehensive risk assessment was done prior to discharge and shows that patient is at low risk for suicide or violence and will continue to be if patient complies with the treatment recommendations, medications and therapy.  At the time of discharge, patient no longer meeting criteria for IVC, patient is not an imminent danger to self or others. patient agrees to call Crisis Services, 911 and/or return to the ED if safety cannot be maintained outside the hospital setting. Discharge medications reviewed with patient, explanation of indication, risks/benefits and side effects profiles. The patient verbalized understanding and is in agreement with the discharge plan.  Physical Findings: AIMS:  , ,  ,  ,    CIWA:  CIWA-Ar Total: 0 COWS:     Musculoskeletal: Strength & Muscle Tone: within normal limits Gait & Station: normal Patient leans: N/A   Psychiatric Specialty Exam:  Presentation  General Appearance:  Appropriate for Environment  Eye Contact: Good  Speech: Clear and Coherent  Speech Volume: Normal  Handedness: Right   Mood and Affect  Mood: Anxious; Depressed  Affect: Blunt   Thought Process  Thought Processes: Coherent; Linear  Descriptions of Associations:Intact  Orientation:Full (Time, Place and Person)  Thought Content:WDL  History of Schizophrenia/Schizoaffective disorder:No  Duration of Psychotic Symptoms:N/A  Hallucinations:Hallucinations: None  Ideas of Reference:None  Suicidal Thoughts:Suicidal Thoughts: No  Homicidal Thoughts:Homicidal Thoughts: No   Sensorium  Memory: Immediate Fair  Judgment: Fair  Insight: Fair   Executive Functions   Concentration: Good  Attention Span: Good  Recall: Fair  Fund of Knowledge: Fair  Language: Fair   Psychomotor Activity  Psychomotor Activity: Psychomotor Activity: Normal   Assets  Assets: Communication Skills; Desire for Improvement; Housing; Social Support   Sleep  Sleep: Sleep: Good    Physical Exam: Physical Exam Vitals and nursing note reviewed.  Eyes:     Extraocular Movements: Extraocular movements intact.  Pulmonary:     Effort: Pulmonary effort is normal.  Neurological:     Mental Status: He is alert. Mental status is at baseline.  Psychiatric:        Attention and Perception: Attention and perception normal.        Mood and Affect: Mood is anxious and depressed. Affect is  blunt.        Speech: Speech normal.        Behavior: Behavior normal. Behavior is cooperative.        Thought Content: Thought content normal.        Judgment: Judgment is impulsive.    Review of Systems  Psychiatric/Behavioral:  Positive for depression and substance abuse. The patient is nervous/anxious.   All other systems reviewed and are negative.  Blood pressure 127/75, pulse 73, temperature 97.7 F (36.5 C), resp. rate 16, height 6\' 4"  (1.93 m), weight 111.1 kg, SpO2 99%. Body mass index is 29.82 kg/m.   Social History   Tobacco Use  Smoking Status Some Days   Current packs/day: 0.00   Types: Cigarettes   Last attempt to quit: 12/25/2020   Years since quitting: 3.1  Smokeless Tobacco Never  Tobacco Comments   Smokes a couple cigarettes every now and then   Tobacco Cessation:  N/A, patient does not currently use tobacco products   Blood Alcohol level:  Lab Results  Component Value Date   ETH <10 02/09/2024   ETH <10 10/10/2023    Metabolic Disorder Labs:  Lab Results  Component Value Date   HGBA1C 5.1 09/15/2023   MPG 99.67 09/15/2023   MPG 103 01/13/2023   No results found for: "PROLACTIN" Lab Results  Component Value Date   CHOL 159  09/15/2023   TRIG 53 09/15/2023   HDL 61 09/15/2023   CHOLHDL 2.6 09/15/2023   VLDL 11 09/15/2023   LDLCALC 87 09/15/2023   LDLCALC 99 01/13/2023    See Psychiatric Specialty Exam and Suicide Risk Assessment completed by Attending Physician prior to discharge.  Discharge destination:  Home  Is patient on multiple antipsychotic therapies at discharge:  No   Has Patient had three or more failed trials of antipsychotic monotherapy by history:  No  Recommended Plan for Multiple Antipsychotic Therapies: NA  Discharge Instructions     Diet - low sodium heart healthy   Complete by: As directed       Allergies as of 02/23/2024       Reactions   Shellfish Allergy Hives, Rash        Medication List     STOP taking these medications    latanoprost  0.005 % ophthalmic solution Commonly known as: XALATAN        TAKE these medications      Indication  acetaminophen  325 MG tablet Commonly known as: TYLENOL  Take 2 tablets (650 mg total) by mouth every 6 (six) hours as needed for mild pain (pain score 1-3).  Indication: Pain   albuterol  108 (90 Base) MCG/ACT inhaler Commonly known as: VENTOLIN  HFA Inhale 2 puffs into the lungs every 6 (six) hours as needed for shortness of breath.  Indication: Asthma, Chronic Obstructive Lung Disease   aspirin  EC 81 MG tablet Take 1 tablet (81 mg total) by mouth daily. Swallow whole.  Indication: hx oof MI   atorvastatin  80 MG tablet Commonly known as: LIPITOR  Take 1 tablet (80 mg total) by mouth daily.  Indication: High Amount of Fats in the Blood   cyanocobalamin  1000 MCG tablet Take 1 tablet (1,000 mcg total) by mouth daily. Start taking on: February 24, 2024  Indication: Inadequate Vitamin B12   docusate sodium  100 MG capsule Commonly known as: Colace Take 1 capsule (100 mg total) by mouth 2 (two) times daily. What changed:  when to take this reasons to take this  Indication: Constipation   folic acid   1 MG tablet Commonly  known as: FOLVITE  Take 1 tablet (1 mg total) by mouth daily. Start taking on: February 24, 2024  Indication: salcohol use   hydrOXYzine  25 MG tablet Commonly known as: ATARAX  Take 1 tablet (25 mg total) by mouth 3 (three) times daily as needed for anxiety.  Indication: Feeling Anxious   loratadine  10 MG tablet Commonly known as: CLARITIN  Take 1 tablet (10 mg total) by mouth once for 1 dose. What changed:  when to take this reasons to take this    mirtazapine  15 MG tablet Commonly known as: REMERON  Take 1 tablet (15 mg total) by mouth at bedtime.  Indication: Major Depressive Disorder   mometasone -formoterol  200-5 MCG/ACT Aero Commonly known as: DULERA  Inhale 2 puffs into the lungs 2 (two) times daily.  Indication: Chronic Obstructive Lung Disease   senna-docusate 8.6-50 MG tablet Commonly known as: Senokot-S Take 1 tablet by mouth at bedtime as needed for mild constipation.  Indication: Constipation   sertraline  100 MG tablet Commonly known as: ZOLOFT  Take 2 tablets (200 mg total) by mouth daily. Start taking on: February 24, 2024 What changed:  medication strength how much to take  Indication: Major Depressive Disorder   vitamin D3 25 MCG tablet Commonly known as: CHOLECALCIFEROL  Take 1 tablet (1,000 Units total) by mouth daily. Start taking on: February 24, 2024  Indication: Vitamin D  Deficiency        Follow-up Information     [facility]. Go to.   Specialty: Behavioral Health Why: In person assessment is 02/27/24 2 PM. Contact information: [address] [place] [address] [phone]                 Follow-up recommendations:  # It is recommended to the patient to continue psychiatric medications as prescribed, after discharge from the hospital.   # It is recommended to the patient to follow up with your outpatient psychiatric provider and PCP. # It was discussed with the patient, the impact of alcohol, drugs, tobacco have been  there overall psychiatric and medical wellbeing, and total abstinence from substance use was recommended. # Prescriptions provided or sent directly to preferred pharmacy at discharge. Patient agreeable to plan. Given the opportunity to ask questions. Appears to feel comfortable with discharge.  # In the event of worsening symptoms, the patient is instructed to call the crisis hotline (988), 911 and or go to the nearest ED for appropriate evaluation and treatment of symptoms. To follow-up with primary care provider for other medical issues, concerns and or health care needs # Patient was discharged home with daughter with a plan to follow up as noted above.    SignedSheilda Deputy, PA-C 02/23/2024, 9:44 AM

## 2024-02-23 NOTE — [facility] Suicide Risk Assessment (Signed)
 Suicide Risk Assessment  Discharge Assessment    [facility] Discharge Suicide Risk Assessment   Principal Problem: Cocaine abuse with cocaine-induced mood disorder (HCC) Discharge Diagnoses: Principal Problem:   Cocaine abuse with cocaine-induced mood disorder (HCC) Active Problems:   Cocaine use disorder (HCC)   Total Time spent with patient: 1.5 hours  Musculoskeletal: Strength & Muscle Tone: within normal limits Gait & Station: normal Patient leans: na  Psychiatric Specialty Exam  Presentation  General Appearance:  Appropriate for Environment  Eye Contact: Good  Speech: Clear and Coherent  Speech Volume: Normal  Handedness: Right   Mood and Affect  Mood: Anxious; Depressed  Duration of Depression Symptoms: Greater than two weeks  Affect: Blunt   Thought Process  Thought Processes: Coherent; Linear  Descriptions of Associations:Intact  Orientation:Full (Time, Place and Person)  Thought Content:WDL  History of Schizophrenia/Schizoaffective disorder:No  Duration of Psychotic Symptoms:N/A  Hallucinations:Hallucinations: None  Ideas of Reference:None  Suicidal Thoughts:Suicidal Thoughts: No  Homicidal Thoughts:Homicidal Thoughts: No   Sensorium  Memory: Immediate Fair  Judgment: Fair  Insight: Fair   Executive Functions  Concentration: Good  Attention Span: Good  Recall: Fair  Fund of Knowledge: Fair  Language: Fair   Psychomotor Activity  Psychomotor Activity: Psychomotor Activity: Normal   Assets  Assets: Communication Skills; Desire for Improvement; Housing; Social Support   Sleep  Sleep: Sleep: Good   Physical Exam: Physical Exam Vitals and nursing note reviewed.  Eyes:     Extraocular Movements: Extraocular movements intact.  Pulmonary:     Effort: Pulmonary effort is normal.  Neurological:     Mental Status: He is alert. Mental status is at baseline.  Psychiatric:        Attention and Perception:  Attention and perception normal.        Mood and Affect: Mood is anxious and depressed. Affect is blunt.        Speech: Speech normal.        Behavior: Behavior normal. Behavior is cooperative.        Thought Content: Thought content normal.        Judgment: Judgment is impulsive.    Review of Systems  Psychiatric/Behavioral:  Positive for depression and substance abuse. The patient is nervous/anxious.   All other systems reviewed and are negative.  Blood pressure 127/75, pulse 73, temperature 97.7 F (36.5 C), resp. rate 16, height 6\' 4"  (1.93 m), weight 111.1 kg, SpO2 99%. Body mass index is 29.82 kg/m.  Mental Status Per Nursing Assessment::   On Admission:  Suicidal ideation indicated by patient  Demographic Factors:  Male, Age 86 or older, and Unemployed  Loss Factors: Financial problems/change in socioeconomic status  Historical Factors: Prior suicide attempts and Impulsivity  Risk Reduction Factors:   Living with another person, especially a relative and Positive social support  Continued Clinical Symptoms:  Depression:   Comorbid alcohol abuse/dependence Alcohol/Substance Abuse/Dependencies Previous Psychiatric Diagnoses and Treatments Medical Diagnoses and Treatments/Surgeries  Cognitive Features That Contribute To Risk:  None    Suicide Risk:  Minimal: No identifiable suicidal ideation.  Patients presenting with no risk factors but with morbid ruminations; may be classified as minimal risk based on the severity of the depressive symptoms   Follow-up Information     [facility]. Go to.   Specialty: Behavioral Health Why: In person assessment is 02/27/24 2 PM. Contact information: [address] [place] [address] [phone]  Plan Of Care/Follow-up recommendations:  # It is recommended to the patient to continue psychiatric medications as prescribed, after discharge from the hospital.   # It is recommended to  the patient to follow up with your outpatient psychiatric provider and PCP. # It was discussed with the patient, the impact of alcohol, drugs, tobacco have been there overall psychiatric and medical wellbeing, and total abstinence from substance use was recommended. # Prescriptions provided or sent directly to preferred pharmacy at discharge. Patient agreeable to plan. Given the opportunity to ask questions. Appears to feel comfortable with discharge.  # In the event of worsening symptoms, the patient is instructed to call the crisis hotline (988), 911 and or go to the nearest ED for appropriate evaluation and treatment of symptoms. To follow-up with primary care provider for other medical issues, concerns and or health care needs # Patient was discharged home with his daughter with a plan to follow up as noted above.    Braedan Meuth, PA-C 02/23/2024, 9:41 AM

## 2024-02-23 NOTE — Group Note (Signed)
 Date:  02/23/2024 Time:  1:05 AM  Group Topic/Focus:  Wrap-Up Group:   The focus of this group is to help patients review their daily goal of treatment and discuss progress on daily workbooks.    Participation Level:  Did Not Attend  Robert Chan 02/23/2024, 1:05 AM

## 2024-02-23 NOTE — Progress Notes (Signed)
 Patient is discharging at this time. Patient is A&Ox4. Stable. Patient denies SI,HI, and A/V/H with no plan/intent. Printed AVS reviewed with and given to patient along with medications and follow up appointments. Suicide safety plan refused by patient. Patient verbalized all understanding. All valuables/belongings returned to patient. Patient is being transported by his sister. Patient denies any pain/discomfort. No s/s of current distress.

## 2024-02-23 NOTE — Group Note (Signed)
 Recreation Therapy Group Note   Group Topic:Coping Skills  Group Date: 02/23/2024 Start Time: 1000 End Time: 1050 Facilitators: Deatrice Factor, LRT, CTRS Location:  Craft Room  Group Description: Mind Map.  Patient was provided a blank template of a diagram with 32 blank boxes in a tiered system, branching from the center (similar to a bubble chart). LRT directed patients to label the middle of the diagram "Coping Skills". LRT and patients then came up with 8 different coping skills as examples. Pt were directed to record their coping skills in the 2nd tier boxes closest to the center.  Patients would then share their coping skills with the group as LRT wrote them out. LRT gave a handout of 99 different coping skills at the end of group.   Goal Area(s) Addressed: Patients will be able to define "coping skills". Patient will identify new coping skills.  Patient will increase communication.   Affect/Mood: N/A   Participation Level: Did not attend    Clinical Observations/Individualized Feedback: Patient did not attend group.   Plan: Continue to engage patient in RT group sessions 2-3x/week.   Deatrice Factor, LRT, CTRS 02/23/2024 11:11 AM

## 2024-03-05 ENCOUNTER — Ambulatory Visit: Admitting: Family Medicine

## 2024-03-20 ENCOUNTER — Telehealth (INDEPENDENT_AMBULATORY_CARE_PROVIDER_SITE_OTHER): Payer: Self-pay | Admitting: Primary Care

## 2024-03-20 NOTE — Telephone Encounter (Signed)
 Calle pt to confirm appt. Pt phone is not working.

## 2024-03-21 ENCOUNTER — Ambulatory Visit: Admitting: Nurse Practitioner

## 2024-03-21 ENCOUNTER — Ambulatory Visit (INDEPENDENT_AMBULATORY_CARE_PROVIDER_SITE_OTHER): Admitting: Primary Care

## 2024-03-23 ENCOUNTER — Telehealth: Payer: Self-pay

## 2024-03-23 NOTE — Telephone Encounter (Signed)
 Pt came in on 5/21 for an appt that's sch! Pt up and left his paperwork at the front window and left didn't inform the staff of him leaving the clinic.

## 2024-03-28 ENCOUNTER — Observation Stay (HOSPITAL_COMMUNITY)
Admission: EM | Admit: 2024-03-28 | Discharge: 2024-03-31 | Disposition: A | Discharge status: Home / Self Care | Attending: Internal Medicine | Admitting: Internal Medicine

## 2024-03-28 ENCOUNTER — Encounter (HOSPITAL_COMMUNITY): Payer: Self-pay | Admitting: Emergency Medicine

## 2024-03-28 ENCOUNTER — Emergency Department (HOSPITAL_COMMUNITY)

## 2024-03-28 DIAGNOSIS — K2940 Chronic atrophic gastritis without bleeding: Secondary | ICD-10-CM

## 2024-03-28 DIAGNOSIS — D649 Anemia, unspecified: Secondary | ICD-10-CM | POA: Insufficient documentation

## 2024-03-28 DIAGNOSIS — R770 Abnormality of albumin: Secondary | ICD-10-CM | POA: Diagnosis not present

## 2024-03-28 DIAGNOSIS — K625 Hemorrhage of anus and rectum: Secondary | ICD-10-CM | POA: Diagnosis present

## 2024-03-28 DIAGNOSIS — I214 Non-ST elevation (NSTEMI) myocardial infarction: Secondary | ICD-10-CM | POA: Insufficient documentation

## 2024-03-28 DIAGNOSIS — R195 Other fecal abnormalities: Secondary | ICD-10-CM | POA: Insufficient documentation

## 2024-03-28 DIAGNOSIS — J449 Chronic obstructive pulmonary disease, unspecified: Secondary | ICD-10-CM | POA: Diagnosis not present

## 2024-03-28 DIAGNOSIS — K648 Other hemorrhoids: Secondary | ICD-10-CM | POA: Insufficient documentation

## 2024-03-28 DIAGNOSIS — Z7982 Long term (current) use of aspirin: Secondary | ICD-10-CM | POA: Diagnosis not present

## 2024-03-28 DIAGNOSIS — I2510 Atherosclerotic heart disease of native coronary artery without angina pectoris: Secondary | ICD-10-CM | POA: Insufficient documentation

## 2024-03-28 DIAGNOSIS — F17210 Nicotine dependence, cigarettes, uncomplicated: Secondary | ICD-10-CM | POA: Diagnosis not present

## 2024-03-28 DIAGNOSIS — K5730 Diverticulosis of large intestine without perforation or abscess without bleeding: Secondary | ICD-10-CM | POA: Diagnosis not present

## 2024-03-28 DIAGNOSIS — I129 Hypertensive chronic kidney disease with stage 1 through stage 4 chronic kidney disease, or unspecified chronic kidney disease: Secondary | ICD-10-CM | POA: Diagnosis not present

## 2024-03-28 DIAGNOSIS — K3189 Other diseases of stomach and duodenum: Secondary | ICD-10-CM | POA: Insufficient documentation

## 2024-03-28 DIAGNOSIS — N189 Chronic kidney disease, unspecified: Secondary | ICD-10-CM | POA: Insufficient documentation

## 2024-03-28 DIAGNOSIS — F259 Schizoaffective disorder, unspecified: Secondary | ICD-10-CM | POA: Diagnosis not present

## 2024-03-28 DIAGNOSIS — K2960 Other gastritis without bleeding: Secondary | ICD-10-CM | POA: Insufficient documentation

## 2024-03-28 DIAGNOSIS — E785 Hyperlipidemia, unspecified: Secondary | ICD-10-CM | POA: Insufficient documentation

## 2024-03-28 DIAGNOSIS — Z79899 Other long term (current) drug therapy: Secondary | ICD-10-CM | POA: Insufficient documentation

## 2024-03-28 DIAGNOSIS — R200 Anesthesia of skin: Secondary | ICD-10-CM

## 2024-03-28 DIAGNOSIS — Z8709 Personal history of other diseases of the respiratory system: Secondary | ICD-10-CM | POA: Diagnosis not present

## 2024-03-28 DIAGNOSIS — E872 Acidosis, unspecified: Secondary | ICD-10-CM | POA: Insufficient documentation

## 2024-03-28 DIAGNOSIS — E663 Overweight: Secondary | ICD-10-CM | POA: Diagnosis not present

## 2024-03-28 DIAGNOSIS — K921 Melena: Secondary | ICD-10-CM | POA: Diagnosis not present

## 2024-03-28 DIAGNOSIS — D62 Acute posthemorrhagic anemia: Secondary | ICD-10-CM | POA: Diagnosis not present

## 2024-03-28 DIAGNOSIS — R1013 Epigastric pain: Secondary | ICD-10-CM | POA: Diagnosis not present

## 2024-03-28 DIAGNOSIS — K922 Gastrointestinal hemorrhage, unspecified: Principal | ICD-10-CM | POA: Insufficient documentation

## 2024-03-28 LAB — TYPE AND SCREEN
ABO/RH(D): O POS
Antibody Screen: NEGATIVE

## 2024-03-28 LAB — COMPREHENSIVE METABOLIC PANEL WITH GFR
ALT: 21 U/L (ref 0–44)
AST: 28 U/L (ref 15–41)
Albumin: 3.5 g/dL (ref 3.5–5.0)
Alkaline Phosphatase: 72 U/L (ref 38–126)
Anion gap: 9 (ref 5–15)
BUN: 9 mg/dL (ref 8–23)
CO2: 20 mmol/L — ABNORMAL LOW (ref 22–32)
Calcium: 8.6 mg/dL — ABNORMAL LOW (ref 8.9–10.3)
Chloride: 107 mmol/L (ref 98–111)
Creatinine, Ser: 1.1 mg/dL (ref 0.61–1.24)
GFR, Estimated: >60 mL/min (ref >60)
Glucose, Bld: 118 mg/dL — ABNORMAL HIGH (ref 70–99)
Potassium: 3.7 mmol/L (ref 3.5–5.1)
Sodium: 136 mmol/L (ref 135–145)
Total Bilirubin: 0.9 mg/dL (ref 0.0–1.2)
Total Protein: 7.3 g/dL (ref 6.5–8.1)

## 2024-03-28 LAB — CBC
HCT: 38.7 % — ABNORMAL LOW (ref 39.0–52.0)
Hemoglobin: 12.4 g/dL — ABNORMAL LOW (ref 13.0–17.0)
MCH: 30 pg (ref 26.0–34.0)
MCHC: 32 g/dL (ref 30.0–36.0)
MCV: 93.5 fL (ref 80.0–100.0)
Platelets: 273 10*3/uL (ref 150–400)
RBC: 4.14 MIL/uL — ABNORMAL LOW (ref 4.22–5.81)
RDW: 13.3 % (ref 11.5–15.5)
WBC: 6.2 10*3/uL (ref 4.0–10.5)
nRBC: 0 % (ref 0.0–0.2)

## 2024-03-28 LAB — PROTIME-INR
INR: 1.1 (ref 0.8–1.2)
Prothrombin Time: 14 s (ref 11.4–15.2)

## 2024-03-28 LAB — POC OCCULT BLOOD, ED: Fecal Occult Bld: POSITIVE — AB

## 2024-03-28 LAB — MAGNESIUM: Magnesium: 1.8 mg/dL (ref 1.7–2.4)

## 2024-03-28 MED ORDER — MELATONIN 3 MG PO TABS: 3.0000 mg | ORAL_TABLET | Freq: Every evening | ORAL | Status: DC | PRN

## 2024-03-28 MED ORDER — ACETAMINOPHEN 325 MG PO TABS
650.0000 mg | ORAL_TABLET | Freq: Four times a day (QID) | ORAL | Status: DC | PRN
  Administered 2024-03-29: 650 mg via ORAL
  Filled 2024-03-28 (×2): qty 2

## 2024-03-28 MED ORDER — IOHEXOL 350 MG/ML SOLN
75.0000 mL | Freq: Once | INTRAVENOUS | Status: AC | PRN
  Administered 2024-03-28: 75 mL via INTRAVENOUS

## 2024-03-28 MED ORDER — PANTOPRAZOLE SODIUM 40 MG IV SOLR
40.0000 mg | Freq: Two times a day (BID) | INTRAVENOUS | Status: DC
  Administered 2024-03-28 – 2024-03-31 (×6): 40 mg via INTRAVENOUS
  Filled 2024-03-28 (×6): qty 10

## 2024-03-28 MED ORDER — LACTATED RINGERS IV SOLN: INTRAVENOUS | Status: AC

## 2024-03-28 MED ORDER — ONDANSETRON HCL 4 MG/2ML IJ SOLN: 4.0000 mg | Freq: Four times a day (QID) | INTRAMUSCULAR | Status: DC | PRN

## 2024-03-28 MED ORDER — ACETAMINOPHEN 650 MG RE SUPP
650.0000 mg | Freq: Four times a day (QID) | RECTAL | Status: DC | PRN
Start: 2024-03-28 — End: 2024-03-31

## 2024-03-28 NOTE — H&P (Signed)
 History and Physical      Robert Chan NWG:[id number] DOB: 1956-02-10 DOA: 03/28/2024; DOS: 03/28/2024  PCP: Pcp, No *** Patient coming from: home ***  I have personally briefly reviewed patient's old medical records in [facility]  Chief Complaint: ***  HPI: Robert Chan is a 68 y.o. male with medical history significant for *** who is admitted to [facility] on 03/28/2024 with *** after presenting from home*** to [facility] ED complaining of ***.    ***       ***   ED Course:  Vital signs in the ED were notable for the following: ***  Labs were notable for the following: ***  Per my interpretation, EKG in ED demonstrated the following:  ***  Imaging in the ED, per corresponding formal radiology read, was notable for the following:  ***  EDP discussed patient's case with on-call [facility], Dr. Yvone Herd, who conveyed that [facility] will formally consult and will see the patient in the morning to determine if EGD is indicated.  While in the ED, the following were administered: ***  Subsequently, the patient was admitted  ***  ***red    Review of Systems: As per HPI otherwise 10 point review of systems negative.   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   Depression    Elevated serum creatinine 08/28/2019   Homelessness 06/19/2020   Homicidal ideations 05/25/2023   Hypertension    Major depressive disorder, recurrent episode, 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/11/2022   NSTEMI (non-ST elevated myocardial infarction) (HCC)    Schizoaffective disorder (HCC) 11/08/2022   Status post incision and drainage 04/22/2021   Suicidal ideation 06/05/2020    Past Surgical History:  Procedure Laterality  Date   APPENDECTOMY     EYE SURGERY     LEFT HEART CATH AND CORONARY ANGIOGRAPHY N/A 06/05/2020   Procedure: LEFT HEART CATH AND CORONARY ANGIOGRAPHY;  Surgeon: Cody Das, MD;  Location: [facility] INVASIVE CV LAB;  Service: Cardiovascular;  Laterality: N/A;   PROSTATE SURGERY      Social History:  reports that he has been smoking cigarettes. He has never used smokeless tobacco. He reports current alcohol use. He reports current drug use. Drug: Cocaine.   Allergies  Allergen Reactions   Shellfish Allergy Hives and Rash    Family History  Problem Relation Age of Onset   Hypertension Mother    Diabetes Mother    Hypertension Father    Hypertension Sister     Family history reviewed and not pertinent ***   Prior to Admission medications   Medication Sig Start Date End Date Taking? Authorizing Provider  acetaminophen  (TYLENOL ) 325 MG tablet Take 2 tablets (650 mg total) by mouth every 6 (six) hours as needed for mild pain (pain score 1-3). 11/09/23   Carrion-Carrero, Jacalyn Martin, MD  albuterol  (VENTOLIN  HFA) 108 (90 Base) MCG/ACT inhaler Inhale 2 puffs into the lungs every 6 (six) hours as needed for shortness of breath. 12/29/23   Hassie Lint, PA-C  aspirin  EC 81 MG tablet Take 1 tablet (81 mg total) by mouth daily. Swallow whole. 12/21/23   Jacqueline Matsu, MD  atorvastatin  (LIPITOR ) 80 MG tablet  Take 1 tablet (80 mg total) by mouth daily. 12/21/23 02/09/24  Jacqueline Matsu, MD  cholecalciferol  (CHOLECALCIFEROL ) 25 MCG tablet Take 1 tablet (1,000 Units total) by mouth daily. 02/24/24   Tingling, Trevor Fudge, PA-C  cyanocobalamin  1000 MCG tablet Take 1 tablet (1,000 mcg total) by mouth daily. 02/24/24   Tingling, Trevor Fudge, PA-C  docusate sodium  (COLACE) 100 MG capsule Take 1 capsule (100 mg total) by mouth 2 (two) times daily. Patient taking differently: Take 100 mg by mouth daily as needed for mild constipation. 12/29/23   Hassie Lint, PA-C  folic acid  (FOLVITE ) 1 MG tablet Take 1  tablet (1 mg total) by mouth daily. 02/24/24   Tingling, Trevor Fudge, PA-C  hydrOXYzine  (ATARAX ) 25 MG tablet Take 1 tablet (25 mg total) by mouth 3 (three) times daily as needed for anxiety. 02/23/24   Tingling, Trevor Fudge, PA-C  loratadine  (CLARITIN ) 10 MG tablet Take 1 tablet (10 mg total) by mouth once for 1 dose. Patient taking differently: Take 10 mg by mouth daily as needed for allergies. 11/09/23 02/09/24  Carrion-Carrero, Jacalyn Martin, MD  mirtazapine  (REMERON ) 15 MG tablet Take 1 tablet (15 mg total) by mouth at bedtime. 02/23/24   Tingling, Trevor Fudge, PA-C  mometasone -formoterol  (DULERA ) 200-5 MCG/ACT AERO Inhale 2 puffs into the lungs 2 (two) times daily. 12/29/23   Hassie Lint, PA-C  senna-docusate (SENOKOT-S) 8.6-50 MG tablet Take 1 tablet by mouth at bedtime as needed for mild constipation. 11/09/23   Carrion-Carrero, Jacalyn Martin, MD  sertraline  (ZOLOFT ) 100 MG tablet Take 2 tablets (200 mg total) by mouth daily. 02/24/24   Sheilda Deputy, PA-C     Objective    Physical Exam: Vitals:   03/28/24 1601 03/28/24 1800 03/28/24 2038 03/28/24 2145  BP: (!) 143/83 (!) 144/82 139/83 (!) 166/114  Pulse: 72 74 67 83  Resp: 17 17 14    Temp: (!) 97.5 F (36.4 C)  98.3 F (36.8 C)   TempSrc:   Oral   SpO2: 98% 100% 100% 100%    General: appears to be stated age; alert, oriented Skin: warm, dry, no rash Head:  AT/[place] Mouth:  Oral mucosa membranes appear moist, normal dentition Neck: supple; trachea midline Heart:  RRR; did not appreciate any M/R/G Lungs: CTAB, did not appreciate any wheezes, rales, or rhonchi Abdomen: + BS; soft, ND, NT Vascular: 2+ pedal pulses b/l; 2+ radial pulses b/l Extremities: no peripheral edema, no muscle wasting Neuro: strength and sensation intact in upper and lower extremities b/l ***   *** Neuro: 5/5 strength of the proximal and distal flexors and extensors of the upper and lower extremities bilaterally; sensation intact in upper and lower extremities b/l;  cranial nerves II through XII grossly intact; no pronator drift; no evidence suggestive of slurred speech, dysarthria, or facial droop; Normal muscle tone. No tremors.  *** Neuro: In the setting of the patient's current mental status and associated inability to follow instructions, unable to perform full neurologic exam at this time.  As such, assessment of strength, sensation, and cranial nerves is limited at this time. Patient noted to spontaneously move all 4 extremities. No tremors.  ***    Labs on Admission: I have personally reviewed following labs and imaging studies  CBC: Recent Labs  Lab 03/28/24 1705  WBC 6.2  HGB 12.4*  HCT 38.7*  MCV 93.5  PLT 273   Basic Metabolic Panel: Recent Labs  Lab 03/28/24 1705  NA 136  K 3.7  CL 107  CO2 20*  GLUCOSE 118*  BUN 9  CREATININE 1.10  CALCIUM  8.6*  MG 1.8   GFR: CrCl cannot be calculated (Unknown ideal weight.). Liver Function Tests: Recent Labs  Lab 03/28/24 1705  AST 28  ALT 21  ALKPHOS 72  BILITOT 0.9  PROT 7.3  ALBUMIN 3.5   No results for input(s): "LIPASE", "AMYLASE" in the last 168 hours. No results for input(s): "AMMONIA" in the last 168 hours. Coagulation Profile: Recent Labs  Lab 03/28/24 1705  INR 1.1   Cardiac Enzymes: No results for input(s): "CKTOTAL", "CKMB", "CKMBINDEX", "TROPONINI" in the last 168 hours. BNP (last 3 results) No results for input(s): "PROBNP" in the last 8760 hours. HbA1C: No results for input(s): "HGBA1C" in the last 72 hours. CBG: No results for input(s): "GLUCAP" in the last 168 hours. Lipid Profile: No results for input(s): "CHOL", "HDL", "LDLCALC", "TRIG", "CHOLHDL", "LDLDIRECT" in the last 72 hours. Thyroid  Function Tests: No results for input(s): "TSH", "T4TOTAL", "FREET4", "T3FREE", "THYROIDAB" in the last 72 hours. Anemia Panel: No results for input(s): "VITAMINB12", "FOLATE", "FERRITIN", "TIBC", "IRON", "RETICCTPCT" in the last 72 hours. Urine analysis:     Component Value Date/Time   COLORURINE AMBER (A) 11/07/2023 1706   APPEARANCEUR HAZY (A) 11/07/2023 1706   LABSPEC 1.025 11/07/2023 1706   PHURINE 5.0 11/07/2023 1706   GLUCOSEU NEGATIVE 11/07/2023 1706   HGBUR NEGATIVE 11/07/2023 1706   BILIRUBINUR small (A) 12/29/2023 1622   KETONESUR negative 12/29/2023 1622   KETONESUR NEGATIVE 11/07/2023 1706   PROTEINUR NEGATIVE 11/07/2023 1706   UROBILINOGEN 1.0 12/29/2023 1622   NITRITE Negative 12/29/2023 1622   NITRITE NEGATIVE 11/07/2023 1706   LEUKOCYTESUR Negative 12/29/2023 1622   LEUKOCYTESUR TRACE (A) 11/07/2023 1706    Radiological Exams on Admission: CT ABDOMEN PELVIS W CONTRAST Result Date: 03/28/2024 CLINICAL DATA:  Abdomen pain black stool for 3 days EXAM: CT ABDOMEN AND PELVIS WITH CONTRAST TECHNIQUE: Multidetector CT imaging of the abdomen and pelvis was performed using the standard protocol following bolus administration of intravenous contrast. RADIATION DOSE REDUCTION: This exam was performed according to the departmental dose-optimization program which includes automated exposure control, adjustment of the mA and/or kV according to patient size and/or use of iterative reconstruction technique. CONTRAST:  75mL OMNIPAQUE  IOHEXOL  350 MG/ML SOLN COMPARISON:  CT 06/01/2023, 09/07/2022 FINDINGS: Lower chest: Lung bases demonstrate no acute airspace disease. Hepatobiliary: Hepatic cysts. No calcified gallstone or biliary dilatation Pancreas: Unremarkable. No pancreatic ductal dilatation or surrounding inflammatory changes. Spleen: Normal in size without focal abnormality. Adrenals/Urinary Tract: Adrenal glands are normal. Kidneys show no hydronephrosis. The bladder is diffusely thick walled but decompressed Stomach/Bowel: The stomach is nonenlarged. Vague submucosal fat density at the gastric fundus without change. Fluid-filled nondilated small bowel. No acute bowel wall thickening. Vascular/Lymphatic: Aortic atherosclerosis. No enlarged  abdominal or pelvic lymph nodes. Reproductive: Prostatectomy Other: Negative for pelvic effusion or free air Musculoskeletal: No acute or suspicious osseous abnormality IMPRESSION: 1. No CT evidence for acute intra-abdominal or pelvic abnormality. 2. Fluid-filled nondilated small bowel, possible enteritis. 3. Diffuse bladder wall thickening but decompressed. 4. Aortic atherosclerosis. Aortic Atherosclerosis (ICD10-I70.0). Electronically Signed   By: Esmeralda Hedge M.D.   On: 03/28/2024 20:18      Assessment/Plan   Principal Problem:   Acute upper GI bleed   ***            ***                  ***                   ***                  ***                  ***                  ***                   ***                  ***                  ***                  ***                  ***                 ***                ***  DVT prophylaxis: SCD's ***  Code Status: Full code*** Family Communication: none*** Disposition Plan: Per Rounding Team Consults called: EDP discussed patient's case with on-call [facility], Dr. Yvone Herd, who conveyed that [facility] will formally consult and will see the patient in the morning to determine if EGD is indicated.;  Admission status: ***     I SPENT GREATER THAN 75 *** MINUTES IN CLINICAL CARE TIME/MEDICAL DECISION-MAKING IN COMPLETING THIS ADMISSION.      Gattis Kass Mylo Driskill DO [facility]  From 7PM - 7AM   03/28/2024, 10:05 PM   ***

## 2024-03-28 NOTE — ED Provider Triage Note (Signed)
 Emergency Medicine Provider Triage Evaluation Note  Robert Chan , a 68 y.o. male  was evaluated in triage.  Pt complains of black stool X 3 days, diarrhea every 15 minutes since last night. NO relief with pepto bismol.  Review of Systems  Positive:  Negative:   Physical Exam  BP (!) 143/83 (BP Location: Right Arm)   Pulse 72   Temp (!) 97.5 F (36.4 C)   Resp 17   SpO2 98%  Gen:   Awake, no distress   Resp:  Normal effort  MSK:   Moves extremities without difficulty  Other:    Medical Decision Making  Medically screening exam initiated at 4:40 PM.  Appropriate orders placed.  Robert Chan was informed that the remainder of the evaluation will be completed by another provider, this initial triage assessment does not replace that evaluation, and the importance of remaining in the ED until their evaluation is complete.     Aimee Houseman, [place] 03/28/24 [phone]

## 2024-03-28 NOTE — ED Triage Notes (Signed)
 Pt complains of abd pain and black stool x 3 days. Pt states diarrhea every 

## 2024-03-28 NOTE — ED Provider Notes (Signed)
 [place] [facility] Provider Note   CSN: [id number] Arrival date & time: 03/28/24  1558     History  Chief Complaint  Patient presents with   Abdominal Pain   Rectal Bleeding    Robert Chan is a 68 y.o. male.   Abdominal Pain Associated symptoms: diarrhea and hematochezia   Rectal Bleeding Associated symptoms: abdominal pain   Patient presents for diarrhea.  Medical history includes polysubstance abuse, depression, CAD, GERD, HLD, COPD, HTN.  He is not on a blood thinner.  Onset of diarrhea was 3 days ago.  He noticed that his stool appeared black in color.  He had some mild generalized abdominal discomfort.  He had increasing frequency of diarrhea last night.  Due to his persistent and seemingly worsening symptoms, he presents to the ED.  Currently, he denies any abdominal discomfort.  He denies issues with nausea or postprandial pain.  His last episode of diarrhea was 2 hours ago.     Home Medications Prior to Admission medications   Medication Sig Start Date End Date Taking? Authorizing Provider  acetaminophen  (TYLENOL ) 325 MG tablet Take 2 tablets (650 mg total) by mouth every 6 (six) hours as needed for mild pain (pain score 1-3). 11/09/23   Carrion-Carrero, Jacalyn Martin, MD  albuterol  (VENTOLIN  HFA) 108 (90 Base) MCG/ACT inhaler Inhale 2 puffs into the lungs every 6 (six) hours as needed for shortness of breath. 12/29/23   Hassie Lint, PA-C  aspirin  EC 81 MG tablet Take 1 tablet (81 mg total) by mouth daily. Swallow whole. 12/21/23   Jacqueline Matsu, MD  atorvastatin  (LIPITOR ) 80 MG tablet Take 1 tablet (80 mg total) by mouth daily. 12/21/23 02/09/24  Jacqueline Matsu, MD  cholecalciferol  (CHOLECALCIFEROL ) 25 MCG tablet Take 1 tablet (1,000 Units total) by mouth daily. 02/24/24   Tingling, Trevor Fudge, PA-C  cyanocobalamin  1000 MCG tablet Take 1 tablet (1,000 mcg total) by mouth daily. 02/24/24   Tingling, Trevor Fudge, PA-C  docusate sodium   (COLACE) 100 MG capsule Take 1 capsule (100 mg total) by mouth 2 (two) times daily. Patient taking differently: Take 100 mg by mouth daily as needed for mild constipation. 12/29/23   Hassie Lint, PA-C  folic acid  (FOLVITE ) 1 MG tablet Take 1 tablet (1 mg total) by mouth daily. 02/24/24   Tingling, Trevor Fudge, PA-C  hydrOXYzine  (ATARAX ) 25 MG tablet Take 1 tablet (25 mg total) by mouth 3 (three) times daily as needed for anxiety. 02/23/24   Tingling, Trevor Fudge, PA-C  loratadine  (CLARITIN ) 10 MG tablet Take 1 tablet (10 mg total) by mouth once for 1 dose. Patient taking differently: Take 10 mg by mouth daily as needed for allergies. 11/09/23 02/09/24  Carrion-Carrero, Jacalyn Martin, MD  mirtazapine  (REMERON ) 15 MG tablet Take 1 tablet (15 mg total) by mouth at bedtime. 02/23/24   Tingling, Trevor Fudge, PA-C  mometasone -formoterol  (DULERA ) 200-5 MCG/ACT AERO Inhale 2 puffs into the lungs 2 (two) times daily. 12/29/23   Hassie Lint, PA-C  senna-docusate (SENOKOT-S) 8.6-50 MG tablet Take 1 tablet by mouth at bedtime as needed for mild constipation. 11/09/23   Carrion-Carrero, Jacalyn Martin, MD  sertraline  (ZOLOFT ) 100 MG tablet Take 2 tablets (200 mg total) by mouth daily. 02/24/24   Tingling, Stephanie, PA-C      Allergies    Shellfish allergy    Review of Systems   Review of Systems  Gastrointestinal:  Positive for abdominal pain, diarrhea and hematochezia.  All other systems reviewed and are negative.  Physical Exam Updated Vital Signs BP (!) 151/90 (BP Location: Left Arm)   Pulse 72   Temp 98.2 F (36.8 C) (Oral)   Resp 18   SpO2 100%  Physical Exam Vitals and nursing note reviewed.  Constitutional:      General: He is not in acute distress.    Appearance: He is well-developed. He is not ill-appearing, toxic-appearing or diaphoretic.  HENT:     Head: Normocephalic and atraumatic.     Mouth/Throat:     Mouth: Mucous membranes are moist.  Eyes:     General: No scleral icterus.    Extraocular  Movements: Extraocular movements intact.     Conjunctiva/sclera: Conjunctivae normal.  Cardiovascular:     Rate and Rhythm: Normal rate and regular rhythm.  Pulmonary:     Effort: Pulmonary effort is normal. No respiratory distress.  Abdominal:     General: There is no distension.     Palpations: Abdomen is soft.     Tenderness: There is no abdominal tenderness.  Musculoskeletal:        General: No swelling.     Cervical back: Neck supple.  Skin:    General: Skin is warm and dry.     Capillary Refill: Capillary refill takes less than 2 seconds.     Coloration: Skin is not jaundiced or pale.  Neurological:     General: No focal deficit present.     Mental Status: He is alert and oriented to person, place, and time.  Psychiatric:        Mood and Affect: Mood normal.        Behavior: Behavior normal.     ED Results / Procedures / Treatments   Labs (all labs ordered are listed, but only abnormal results are displayed) Labs Reviewed  COMPREHENSIVE METABOLIC PANEL WITH GFR - Abnormal; Notable for the following components:      Result Value   CO2 20 (*)    Glucose, Bld 118 (*)    Calcium  8.6 (*)    All other components within normal limits  CBC - Abnormal; Notable for the following components:   RBC 4.14 (*)    Hemoglobin 12.4 (*)    HCT 38.7 (*)    All other components within normal limits  POC OCCULT BLOOD, ED - Abnormal; Notable for the following components:   Fecal Occult Bld POSITIVE (*)    All other components within normal limits  PROTIME-INR  MAGNESIUM   CBC WITH DIFFERENTIAL/PLATELET  COMPREHENSIVE METABOLIC PANEL WITH GFR  MAGNESIUM   HEMOGLOBIN AND HEMATOCRIT, BLOOD  HEMOGLOBIN AND HEMATOCRIT, BLOOD  TYPE AND SCREEN    EKG None  Radiology CT ABDOMEN PELVIS W CONTRAST Result Date: 03/28/2024 CLINICAL DATA:  Abdomen pain black stool for 3 days EXAM: CT ABDOMEN AND PELVIS WITH CONTRAST TECHNIQUE: Multidetector CT imaging of the abdomen and pelvis was  performed using the standard protocol following bolus administration of intravenous contrast. RADIATION DOSE REDUCTION: This exam was performed according to the departmental dose-optimization program which includes automated exposure control, adjustment of the mA and/or kV according to patient size and/or use of iterative reconstruction technique. CONTRAST:  75mL OMNIPAQUE  IOHEXOL  350 MG/ML SOLN COMPARISON:  CT 06/01/2023, 09/07/2022 FINDINGS: Lower chest: Lung bases demonstrate no acute airspace disease. Hepatobiliary: Hepatic cysts. No calcified gallstone or biliary dilatation Pancreas: Unremarkable. No pancreatic ductal dilatation or surrounding inflammatory changes. Spleen: Normal in size without focal abnormality. Adrenals/Urinary Tract: Adrenal glands are normal. Kidneys show no hydronephrosis. The bladder is diffusely thick walled  but decompressed Stomach/Bowel: The stomach is nonenlarged. Vague submucosal fat density at the gastric fundus without change. Fluid-filled nondilated small bowel. No acute bowel wall thickening. Vascular/Lymphatic: Aortic atherosclerosis. No enlarged abdominal or pelvic lymph nodes. Reproductive: Prostatectomy Other: Negative for pelvic effusion or free air Musculoskeletal: No acute or suspicious osseous abnormality IMPRESSION: 1. No CT evidence for acute intra-abdominal or pelvic abnormality. 2. Fluid-filled nondilated small bowel, possible enteritis. 3. Diffuse bladder wall thickening but decompressed. 4. Aortic atherosclerosis. Aortic Atherosclerosis (ICD10-I70.0). Electronically Signed   By: Esmeralda Hedge M.D.   On: 03/28/2024 20:18    Procedures Procedures    Medications Ordered in ED Medications  pantoprazole  (PROTONIX ) injection 40 mg (40 mg Intravenous Given 03/28/24 1949)  lactated ringers infusion ( Intravenous New Bag/Given 03/28/24 2359)  acetaminophen  (TYLENOL ) tablet 650 mg (has no administration in time range)    Or  acetaminophen  (TYLENOL ) suppository 650  mg (has no administration in time range)  ondansetron  (ZOFRAN ) injection 4 mg (has no administration in time range)  melatonin tablet 3 mg (has no administration in time range)  iohexol  (OMNIPAQUE ) 350 MG/ML injection 75 mL (75 mLs Intravenous Contrast Given 03/28/24 2006)    ED Course/ Medical Decision Making/ A&P                                 Medical Decision Making Amount and/or Complexity of Data Reviewed Labs: ordered. Radiology: ordered.  Risk Prescription drug management. Decision regarding hospitalization.   This patient presents to the ED for concern of melena, this involves an extensive number of treatment options, and is a complaint that carries with it a high risk of complications and morbidity.  The differential diagnosis includes PUD, AVM, neoplasm, blood loss anemia   Co morbidities / Chronic conditions that complicate the patient evaluation  polysubstance abuse, depression, CAD, GERD, HLD, COPD, HTN   Additional history obtained:  Additional history obtained from EMR External records from outside source obtained and reviewed including N/A   Lab Tests:  I Ordered, and personally interpreted labs.  The pertinent results include: Slight drop in hemoglobin from baseline, no leukocytosis, normal electrolytes.  Hemoccult testing was positive.   Imaging Studies ordered:  I ordered imaging studies including CT of abdomen and pelvis I independently visualized and interpreted imaging which showed fluid-filled small bowel suggestive of enteritis. I agree with the radiologist interpretation   Cardiac Monitoring: / EKG:  The patient was maintained on a cardiac monitor.  I personally viewed and interpreted the cardiac monitored which showed an underlying rhythm of: Sinus rhythm   Problem List / ED Course / Critical interventions / Medication management  Patient presenting for diarrhea and mild abdominal discomfort for the past 3 days.  Diarrhea is described as  black in color.  He has been taking Pepto-Bismol.  On exam, patient is well-appearing.  His abdomen is soft and nontender.  His hemoglobin appears mildly decreased from prior lab work.  I do not suspect clinically significant blood loss at this time.  On DRE, he had a light-colored stool that did test positive for Hemoccult.  Protonix  was ordered.  CT of the abdomen and pelvis showed nondilated fluid-filled small bowel consistent with enteritis.  I spoke with gastroenterologist on-call, Dr. Yvone Herd, who agrees with admission for observation.  GI team will see in the morning.  Patient was admitted for further management. I ordered medication including Protonix  for empiric treatment of upper GI bleed;  IV fluids for hydration Reevaluation of the patient after these medicines showed that the patient improved I have reviewed the patients home medicines and have made adjustments as needed   Consultations Obtained:  I requested consultation with the gastroenterologist, Dr. Yvone Herd,  and discussed lab and imaging findings as well as pertinent plan - they recommend: Medicine admission for observation, GI team will see in the morning   Social Determinants of Health:  Lives independently        Final Clinical Impression(s) / ED Diagnoses Final diagnoses:  Melena    Rx / DC Orders ED Discharge Orders     None         Iva Mariner, MD 03/29/24 0000

## 2024-03-29 ENCOUNTER — Other Ambulatory Visit: Payer: Self-pay

## 2024-03-29 DIAGNOSIS — F259 Schizoaffective disorder, unspecified: Secondary | ICD-10-CM | POA: Diagnosis not present

## 2024-03-29 DIAGNOSIS — D649 Anemia, unspecified: Secondary | ICD-10-CM

## 2024-03-29 DIAGNOSIS — R195 Other fecal abnormalities: Secondary | ICD-10-CM | POA: Diagnosis not present

## 2024-03-29 DIAGNOSIS — R1030 Lower abdominal pain, unspecified: Secondary | ICD-10-CM | POA: Diagnosis not present

## 2024-03-29 DIAGNOSIS — R1013 Epigastric pain: Secondary | ICD-10-CM | POA: Diagnosis not present

## 2024-03-29 DIAGNOSIS — Z8709 Personal history of other diseases of the respiratory system: Secondary | ICD-10-CM

## 2024-03-29 DIAGNOSIS — K921 Melena: Secondary | ICD-10-CM

## 2024-03-29 DIAGNOSIS — K922 Gastrointestinal hemorrhage, unspecified: Secondary | ICD-10-CM | POA: Diagnosis not present

## 2024-03-29 LAB — CBC WITH DIFFERENTIAL/PLATELET
Abs Immature Granulocytes: 0.02 10*3/uL (ref 0.00–0.07)
Basophils Absolute: 0 10*3/uL (ref 0.0–0.1)
Basophils Relative: 0 %
Eosinophils Absolute: 0.2 10*3/uL (ref 0.0–0.5)
Eosinophils Relative: 3 %
HCT: 35.6 % — ABNORMAL LOW (ref 39.0–52.0)
Hemoglobin: 11.4 g/dL — ABNORMAL LOW (ref 13.0–17.0)
Immature Granulocytes: 0 %
Lymphocytes Relative: 26 %
Lymphs Abs: 2 10*3/uL (ref 0.7–4.0)
MCH: 29.5 pg (ref 26.0–34.0)
MCHC: 32 g/dL (ref 30.0–36.0)
MCV: 92 fL (ref 80.0–100.0)
Monocytes Absolute: 1 10*3/uL (ref 0.1–1.0)
Monocytes Relative: 13 %
Neutro Abs: 4.6 10*3/uL (ref 1.7–7.7)
Neutrophils Relative %: 58 %
Platelets: 242 10*3/uL (ref 150–400)
RBC: 3.87 MIL/uL — ABNORMAL LOW (ref 4.22–5.81)
RDW: 13.2 % (ref 11.5–15.5)
WBC: 7.9 10*3/uL (ref 4.0–10.5)
nRBC: 0 % (ref 0.0–0.2)

## 2024-03-29 LAB — COMPREHENSIVE METABOLIC PANEL WITH GFR
ALT: 19 U/L (ref 0–44)
AST: 22 U/L (ref 15–41)
Albumin: 3 g/dL — ABNORMAL LOW (ref 3.5–5.0)
Alkaline Phosphatase: 67 U/L (ref 38–126)
Anion gap: 5 (ref 5–15)
BUN: 8 mg/dL (ref 8–23)
CO2: 21 mmol/L — ABNORMAL LOW (ref 22–32)
Calcium: 8 mg/dL — ABNORMAL LOW (ref 8.9–10.3)
Chloride: 110 mmol/L (ref 98–111)
Creatinine, Ser: 1.14 mg/dL (ref 0.61–1.24)
GFR, Estimated: >60 mL/min (ref >60)
Glucose, Bld: 118 mg/dL — ABNORMAL HIGH (ref 70–99)
Potassium: 3.6 mmol/L (ref 3.5–5.1)
Sodium: 136 mmol/L (ref 135–145)
Total Bilirubin: 0.7 mg/dL (ref 0.0–1.2)
Total Protein: 6.3 g/dL — ABNORMAL LOW (ref 6.5–8.1)

## 2024-03-29 LAB — HEMOGLOBIN AND HEMATOCRIT, BLOOD
HCT: 35.6 % — ABNORMAL LOW (ref 39.0–52.0)
Hemoglobin: 11.3 g/dL — ABNORMAL LOW (ref 13.0–17.0)

## 2024-03-29 LAB — MAGNESIUM: Magnesium: 1.8 mg/dL (ref 1.7–2.4)

## 2024-03-29 MED ORDER — SERTRALINE HCL 100 MG PO TABS
200.0000 mg | ORAL_TABLET | Freq: Every day | ORAL | Status: DC
  Administered 2024-03-29 – 2024-03-31 (×3): 200 mg via ORAL
  Filled 2024-03-29 (×2): qty 4
  Filled 2024-03-29 (×2): qty 2

## 2024-03-29 MED ORDER — ATORVASTATIN CALCIUM 80 MG PO TABS
80.0000 mg | ORAL_TABLET | Freq: Every day | ORAL | Status: DC
  Administered 2024-03-29 – 2024-03-31 (×3): 80 mg via ORAL
  Filled 2024-03-29 (×3): qty 1

## 2024-03-29 MED ORDER — MAGNESIUM SULFATE 2 GM/50ML IV SOLN
2.0000 g | Freq: Once | INTRAVENOUS | Status: AC
  Administered 2024-03-29: 2 g via INTRAVENOUS
  Filled 2024-03-29: qty 50

## 2024-03-29 MED ORDER — SODIUM CHLORIDE 0.9 % IV SOLN: INTRAVENOUS | Status: DC

## 2024-03-29 MED ORDER — FLUTICASONE FUROATE-VILANTEROL 100-25 MCG/ACT IN AEPB
1.0000 | INHALATION_SPRAY | Freq: Every day | RESPIRATORY_TRACT | Status: DC
  Administered 2024-03-30 – 2024-03-31 (×2): 1 via RESPIRATORY_TRACT
  Filled 2024-03-29: qty 28

## 2024-03-29 MED ORDER — ALBUTEROL SULFATE (2.5 MG/3ML) 0.083% IN NEBU: 2.5000 mg | INHALATION_SOLUTION | RESPIRATORY_TRACT | Status: DC | PRN

## 2024-03-29 NOTE — Consult Note (Addendum)
 Referring Provider: Dr. Aura Leeds Primary Care Physician:  Pcp, No Primary Gastroenterologist:  Para Bold   Reason for Consultation: Abdominal pain, dark stools, anemia  HPI: Robert Chan is a 68 y.o. male with a past medical history of depression, schizoaffective disorder, past cocaine use, hypertension, hyperlipidemia, coronary artery disease s/p NSTEMI 06/2020, asthma, COPD and CKD.   He presented to the ED 03/28/2024 due to having central abdominal pain and diarrhea x 3 days. He noted diarrhea was black in color.  Not on anticoagulation.  Labs in the ED showed a WBC count of 6.2.  Hemoglobin 12.4 (Hg 13.6 one month ago).  Hematocrit 38.7.  Platelets 273..  Creatinine 1.10.  Normal LFTs.  INR 1.1.  Magnesium  1.8.  DRE per ED provider documented light brown stool that was FOBT positive.  CTAP showed fluid-filled nondilated small bowel, possible enteritis without acute intra-abdominal/pelvic abnormality.  Urinary bladder wall thickening was also noted.  Labs today: Hemoglobin 11.4.  Hematocrit 35.6.  BUN 8.  Creatinine 1.14.  He was started on Pantoprazole  40 mg IV twice daily.  A GI consult was requested for further evaluation regarding mild anemia, melena  with positive FOBT.  He endorsed having central to lower abdominal pain with diarrhea x 3 days prior to arriving admission date.  He stated his diarrhea was watery and black in color without bright red blood.  He took Pepto-Bismol x 1 or 2 doses yesterday and stated his black stools occurred 2 days prior to taking Pepto-Bismol.  No further diarrhea or black stools since admission. No nausea or vomiting.  No GERD or dysphagia symptoms.  No history of stomach ulcers or GI bleed.  Never had an EGD.  He believes he underwent a colonoscopy 10 years ago which he reported was normal.  No known family history of esophageal, gastric or colorectal cancer.  No recent antibiotics.  No NSAID use.  He drinks one 40 ounce beer 3 days weekly.  He denies  drug use epic records to document history of cocaine use disorder.  He is currently living with his daughter and grandchildren.  Previously admitted to behavioral health for suicidal ideation after his sister died 03/02/2024 - 03/15/2024.  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   Depression    Elevated serum creatinine 08/28/2019   Homelessness 06/19/2020   Homicidal ideations 05/25/2023   Hypertension    Major depressive disorder, recurrent episode, 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/11/2022   NSTEMI (non-ST elevated myocardial infarction) (HCC)    Schizoaffective disorder (HCC) 11/08/2022   Status post incision and drainage 04/22/2021   Suicidal ideation 06/05/2020    Past Surgical History:  Procedure Laterality Date   APPENDECTOMY     EYE SURGERY     LEFT HEART CATH AND CORONARY ANGIOGRAPHY N/A 06/05/2020   Procedure: LEFT HEART CATH AND CORONARY ANGIOGRAPHY;  Surgeon: Cody Das, MD;  Location: [facility];  Service: Cardiovascular;  Laterality: N/A;   PROSTATE SURGERY      Prior to Admission medications   Medication Sig Start Date End Date Taking? Authorizing Provider  albuterol  (VENTOLIN  HFA) 108 (90 Base) MCG/ACT inhaler Inhale 2 puffs into the lungs every 6 (six)  hours as needed for shortness of breath. 12/29/23  Yes McClung, Angela M, PA-C  cholecalciferol  (CHOLECALCIFEROL ) 25 MCG tablet Take 1 tablet (1,000 Units total) by mouth daily. 02/24/24  Yes Tingling, Trevor Fudge, PA-C  cyanocobalamin  1000 MCG tablet Take 1 tablet (1,000 mcg total) by mouth daily. 02/24/24  Yes Tingling, Trevor Fudge, PA-C  mometasone -formoterol  (DULERA ) 200-5 MCG/ACT AERO Inhale 2 puffs into the lungs 2 (two) times daily. Patient  taking differently: Inhale 2 puffs into the lungs 2 (two) times daily as needed for shortness of breath or wheezing. 12/29/23  Yes Hassie Lint, PA-C  sertraline  (ZOLOFT ) 100 MG tablet Take 2 tablets (200 mg total) by mouth daily. 02/24/24  Yes Tingling, Trevor Fudge, PA-C  aspirin  EC 81 MG tablet Take 1 tablet (81 mg total) by mouth daily. Swallow whole. Patient not taking: Reported on 03/29/2024 12/21/23   Jacqueline Matsu, MD  atorvastatin  (LIPITOR ) 80 MG tablet Take 1 tablet (80 mg total) by mouth daily. Patient not taking: Reported on 03/29/2024 12/21/23 02/09/24  Jacqueline Matsu, MD  folic acid  (FOLVITE ) 1 MG tablet Take 1 tablet (1 mg total) by mouth daily. Patient not taking: Reported on 03/29/2024 02/24/24   Tingling, Trevor Fudge, PA-C  hydrOXYzine  (ATARAX ) 25 MG tablet Take 1 tablet (25 mg total) by mouth 3 (three) times daily as needed for anxiety. Patient not taking: Reported on 03/29/2024 02/23/24   Tingling, Trevor Fudge, PA-C  mirtazapine  (REMERON ) 15 MG tablet Take 1 tablet (15 mg total) by mouth at bedtime. Patient not taking: Reported on 03/29/2024 02/23/24   Tingling, Trevor Fudge, PA-C    Current Facility-Administered Medications  Medication Dose Route Frequency Provider Last Rate Last Admin   acetaminophen  (TYLENOL ) tablet 650 mg  650 mg Oral Q6H PRN Howerter, Justin B, DO       Or   acetaminophen  (TYLENOL ) suppository 650 mg  650 mg Rectal Q6H PRN Howerter, Justin B, DO       albuterol  (PROVENTIL ) (2.5 MG/3ML) 0.083% nebulizer solution 2.5 mg  2.5 mg Nebulization Q4H PRN Howerter, Justin B, DO       fluticasone  furoate-vilanterol (BREO ELLIPTA ) 100-25 MCG/ACT 1 puff  1 puff Inhalation Daily Howerter, Justin B, DO       melatonin tablet 3 mg  3 mg Oral QHS PRN Howerter, Justin B, DO       ondansetron  (ZOFRAN ) injection 4 mg  4 mg Intravenous Q6H PRN Howerter, Justin B, DO       pantoprazole  (PROTONIX ) injection 40 mg  40 mg Intravenous Q12H Iva Mariner, MD   40 mg at 03/28/24 1949     Allergies as of 03/28/2024 - Review Complete 03/28/2024  Allergen Reaction Noted   Shellfish allergy Hives and Rash 06/04/2020    Family History  Problem Relation Age of Onset   Hypertension Mother    Diabetes Mother    Hypertension Father    Hypertension Sister     Social History   Socioeconomic History   Marital status: Single    Spouse name: Not on file   Number of children: Not on file   Years of education: Not on file   Highest education level: Not on file  Occupational History   Not on file  Tobacco Use   Smoking status: Some Days    Current packs/day: 0.00    Types: Cigarettes    Last attempt to quit: 12/25/2020    Years since quitting: 3.2   Smokeless tobacco: Never   Tobacco comments:    Smokes a couple  cigarettes every now and then  Vaping Use   Vaping status: Never Used  Substance and Sexual Activity   Alcohol use: Yes    Comment: 1 40oz beer about every other day   Drug use: Yes    Types: Cocaine    Comment: Cocaine about once or twice a month; last use one week ago   Sexual activity: Not Currently  Other Topics Concern   Not on file  Social History Narrative   His mother passed at age 36 (when he was 20 years old) and his father raised him and his siblings   His parents were pastors as a lot of his sisters and other family members are today   There was a total of 13 children in his family Had 8 sisters, one sister deceased , Had 3 brothers, all brothers deceased   He is the youngest of the 39 and was "always in trouble in my younger years"   Family still live in [place], [place], some in [place], [place]   Has a Daughter and son with a total of 10 grandchildren (6 grand daughters local and 4 grandsons)    Values family   Married twice, present wife incarcerated   Social Drivers of Health   Financial Resource Strain: Low Risk  (10/19/2022)   Overall Financial Resource Strain (CARDIA)    Difficulty of Paying Living Expenses: Not hard at all   Food Insecurity: Food Insecurity Present (03/28/2024)   Hunger Vital Sign    Worried About Running Out of Food in the Last Year: Sometimes true    Ran Out of Food in the Last Year: Sometimes true  Transportation Needs: Unmet Transportation Needs (03/28/2024)   PRAPARE - Transportation    Lack of Transportation (Medical): Yes    Lack of Transportation (Non-Medical): Yes  Physical Activity: Inactive (10/19/2022)   Exercise Vital Sign    Days of Exercise per Week: 0 days    Minutes of Exercise per Session: 0 min  Stress: No Stress Concern Present (10/19/2022)   [name] of Occupational Health - Occupational Stress Questionnaire    Feeling of Stress : Not at all  Social Connections: Unknown (03/29/2024)   Social Connection and Isolation Panel [NHANES]    Frequency of Communication with Friends and Family: Twice a week    Frequency of Social Gatherings with Friends and Family: Patient declined    Attends Religious Services: Never    Database administrator or Organizations: No    Attends Banker Meetings: Never    Marital Status: Patient declined  Catering manager Violence: Not At Risk (03/28/2024)   Humiliation, Afraid, Rape, and Kick questionnaire    Fear of Current or Ex-Partner: No    Emotionally Abused: No    Physically Abused: No    Sexually Abused: No   Review of Systems: Gen: Denies fever, sweats or chills. No weight loss.  CV: Denies chest pain, palpitations or edema. Resp: Denies cough, shortness of breath of hemoptysis.  GI: See HPI. GU : Denies urinary burning, blood in urine, increased urinary frequency or incontinence. MS: Denies joint pain, muscles aches or weakness. Derm: Denies rash, itchiness, skin lesions or unhealing ulcers. Psych: + Anxiety and depression.  Heme: Denies easy bruising, bleeding. Neuro:  Denies headaches, dizziness or paresthesias. Endo:  Denies any problems with DM, thyroid  or adrenal function.  Physical Exam: Vital signs  in last 24 hours: Temp:  [97.5 F (36.4 C)-98.5 F (36.9 C)] 97.9 F (36.6  C) (05/29 0803) Pulse Rate:  [67-83] 68 (05/29 0803) Resp:  [14-20] 20 (05/29 0803) BP: (139-166)/(82-114) 143/95 (05/29 0803) SpO2:  [97 %-100 %] 99 % (05/29 0803) Weight:  [118.9 kg] 118.9 kg (05/29 0500) Last BM Date : 03/29/24 General:  Alert 68 year old male in no acute distress. Head:  Normocephalic and atraumatic. Eyes:  No scleral icterus. Conjunctiva pink. Ears:  Normal auditory acuity. Nose:  No deformity, discharge or lesions. Mouth:  Dentition intact. No ulcers or lesions.  Neck:  Supple. No lymphadenopathy or thyromegaly.  Lungs: Breath sounds clear throughout. No wheezes, rhonchi or crackles.  Heart: Rate and rhythm, no murmurs. Abdomen: Soft, nondistended.  Nontender.  Positive bowel sounds to all 4 quadrants. Rectal: Deferred. DRE per EDP documented light brown stool fecal occult positive. Musculoskeletal:  Symmetrical without gross deformities.  Pulses:  Normal pulses noted. Extremities: Trace bilateral lower extremity edema. Neurologic: Alert and  oriented x 4. No focal deficits.  Skin:  Intact without significant lesions or rashes. Psych:  Alert and cooperative. Normal mood and affect.  Intake/Output from previous day: 05/28 0701 - 05/29 0700 In: 572.7 [I.V.:572.7] Out: -  Intake/Output this shift: No intake/output data recorded.  Lab Results: Recent Labs    03/28/24 1705 03/29/24 0558  WBC 6.2 7.9  HGB 12.4* 11.4*  HCT 38.7* 35.6*  PLT 273 242   BMET Recent Labs    03/28/24 1705 03/29/24 0558  NA 136 136  K 3.7 3.6  CL 107 110  CO2 20* 21*  GLUCOSE 118* 118*  BUN 9 8  CREATININE 1.10 1.14  CALCIUM  8.6* 8.0*   LFT Recent Labs    03/29/24 0558  PROT 6.3*  ALBUMIN 3.0*  AST 22  ALT 19  ALKPHOS 67  BILITOT 0.7   PT/INR Recent Labs    03/28/24 1705  LABPROT 14.0  INR 1.1   Hepatitis Panel No results for input(s): "HEPBSAG", "HCVAB", "HEPAIGM",  "HEPBIGM" in the last 72 hours.    Studies/Results: CT ABDOMEN PELVIS W CONTRAST Result Date: 03/28/2024 CLINICAL DATA:  Abdomen pain black stool for 3 days EXAM: CT ABDOMEN AND PELVIS WITH CONTRAST TECHNIQUE: Multidetector CT imaging of the abdomen and pelvis was performed using the standard protocol following bolus administration of intravenous contrast. RADIATION DOSE REDUCTION: This exam was performed according to the departmental dose-optimization program which includes automated exposure control, adjustment of the mA and/or kV according to patient size and/or use of iterative reconstruction technique. CONTRAST:  75mL OMNIPAQUE  IOHEXOL  350 MG/ML SOLN COMPARISON:  CT 06/01/2023, 09/07/2022 FINDINGS: Lower chest: Lung bases demonstrate no acute airspace disease. Hepatobiliary: Hepatic cysts. No calcified gallstone or biliary dilatation Pancreas: Unremarkable. No pancreatic ductal dilatation or surrounding inflammatory changes. Spleen: Normal in size without focal abnormality. Adrenals/Urinary Tract: Adrenal glands are normal. Kidneys show no hydronephrosis. The bladder is diffusely thick walled but decompressed Stomach/Bowel: The stomach is nonenlarged. Vague submucosal fat density at the gastric fundus without change. Fluid-filled nondilated small bowel. No acute bowel wall thickening. Vascular/Lymphatic: Aortic atherosclerosis. No enlarged abdominal or pelvic lymph nodes. Reproductive: Prostatectomy Other: Negative for pelvic effusion or free air Musculoskeletal: No acute or suspicious osseous abnormality IMPRESSION: 1. No CT evidence for acute intra-abdominal or pelvic abnormality. 2. Fluid-filled nondilated small bowel, possible enteritis. 3. Diffuse bladder wall thickening but decompressed. 4. Aortic atherosclerosis. Aortic Atherosclerosis (ICD10-I70.0). Electronically Signed   By: Esmeralda Hedge M.D.   On: 03/28/2024 20:18    IMPRESSION/PLAN:  68 year old male with central to lower abdominal pain  and  black diarrhea x 3 days with mild anemia and positive FOBT.  Black stools preceded Pepto-Bismol use. No diarrhea/melena since admission.  Hemoglobin 12.4 (baseline Hg 13.6) -> 11.4 -> 11.3.  CTAP showed fluid-filled nondilated small bowel suggestive of possible enteritis. - Clear liquid diet - PPI p.o. twice daily - Defer endoscopic recommendations to Dr. Dominic Friendly - CBC, BMP, iron, TIBC, ferritin and B12 level in a.m. - Check GI pathogen panel if patient has diarrhea  CAD s/p NSTEMI 06/2020  History of asthma/COPD, stable  History of  depression, schizoaffective disorder and recent admission to Behavioral Health secondary to suicidal ideation 01/2024   Tory Freiberg  03/29/2024, 1:34 PM   I have taken an interval history, thoroughly reviewed the chart and examined the patient. I agree with the Advanced Practitioner's note, impression and recommendations, and have recorded additional findings, impressions and recommendations below. I performed a substantive portion of this encounter (>50% time spent), including a complete performance of the medical decision making.  My additional thoughts are as follows:  3 days of black diarrhea, FOBT positive, hemoglobin down from baseline.  Denies NSAID use, but has been on aspirin  until he ran out about a week ago. Diarrhea and black stool have stopped, and he has had no BM since admission overnight.  No vomiting anorexia or weight loss. He is naturally concerned because his sister unexpectedly died recently from an upper GI bleed.  Semaj got his daughter on the phone during my visit with him and she explains more about that part of his history and concerns. I have explained to him it is difficult to know what is causing the seen positive stool and if the black diarrhea was GI bleeding, but I think we are obliged to investigate the upper digestive tract to rule out a gastric ulcer or other source of bleeding in that area. He was agreeable after  a thorough discussion of procedure and risks.  The benefits and risks of the planned procedure(s) were described in detail with the patient or (when appropriate) their health care proxy.  Risks were outlined as including, but not limited to, bleeding, infection, perforation, adverse medication reaction leading to cardiac or pulmonary decompensation, pancreatitis (if ERCP).  The limitation of incomplete mucosal visualization was also discussed.  No guarantees or warranties were given. Patient at increased risk for cardiopulmonary complications of procedure due to medical comorbidities.  Check CBC tomorrow  Continue PPI   Again, his daughter was on speaker phone during these discussions and all her questions were answered.  He believes he may have had a colonoscopy in the distant past, no records of that in this EHR.  Need for and timing of colonoscopy dependent on EGD findings.     Kerby Pearson III Office:[phone]

## 2024-03-29 NOTE — Plan of Care (Signed)

## 2024-03-29 NOTE — H&P (View-Only) (Signed)
 Referring Provider: Dr. Aura Leeds Primary Care Physician:  Pcp, No Primary Gastroenterologist:  Para Bold   Reason for Consultation: Abdominal pain, dark stools, anemia  HPI: Robert Chan is a 68 y.o. male with a past medical history of depression, schizoaffective disorder, past cocaine use, hypertension, hyperlipidemia, coronary artery disease s/p NSTEMI 06/2020, asthma, COPD and CKD.   He presented to the ED 03/28/2024 due to having central abdominal pain and diarrhea x 3 days. He noted diarrhea was black in color.  Not on anticoagulation.  Labs in the ED showed a WBC count of 6.2.  Hemoglobin 12.4 (Hg 13.6 one month ago).  Hematocrit 38.7.  Platelets 273..  Creatinine 1.10.  Normal LFTs.  INR 1.1.  Magnesium  1.8.  DRE per ED provider documented light brown stool that was FOBT positive.  CTAP showed fluid-filled nondilated small bowel, possible enteritis without acute intra-abdominal/pelvic abnormality.  Urinary bladder wall thickening was also noted.  Labs today: Hemoglobin 11.4.  Hematocrit 35.6.  BUN 8.  Creatinine 1.14.  He was started on Pantoprazole  40 mg IV twice daily.  A GI consult was requested for further evaluation regarding mild anemia, melena  with positive FOBT.  He endorsed having central to lower abdominal pain with diarrhea x 3 days prior to arriving admission date.  He stated his diarrhea was watery and black in color without bright red blood.  He took Pepto-Bismol x 1 or 2 doses yesterday and stated his black stools occurred 2 days prior to taking Pepto-Bismol.  No further diarrhea or black stools since admission. No nausea or vomiting.  No GERD or dysphagia symptoms.  No history of stomach ulcers or GI bleed.  Never had an EGD.  He believes he underwent a colonoscopy 10 years ago which he reported was normal.  No known family history of esophageal, gastric or colorectal cancer.  No recent antibiotics.  No NSAID use.  He drinks one 40 ounce beer 3 days weekly.  He denies  drug use epic records to document history of cocaine use disorder.  He is currently living with his daughter and grandchildren.  Previously admitted to behavioral health for suicidal ideation after his sister died 03/01/24 - 2024/03/14.  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   Depression    Elevated serum creatinine 08/28/2019   Homelessness 06/19/2020   Homicidal ideations 05/25/2023   Hypertension    Major depressive disorder, recurrent episode, 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/11/2022   NSTEMI (non-ST elevated myocardial infarction) (HCC)    Schizoaffective disorder (HCC) 11/08/2022   Status post incision and drainage 04/22/2021   Suicidal ideation 06/05/2020    Past Surgical History:  Procedure Laterality Date   APPENDECTOMY     EYE SURGERY     LEFT HEART CATH AND CORONARY ANGIOGRAPHY N/A 06/05/2020   Procedure: LEFT HEART CATH AND CORONARY ANGIOGRAPHY;  Surgeon: Cody Das, MD;  Location: [facility];  Service: Cardiovascular;  Laterality: N/A;   PROSTATE SURGERY      Prior to Admission medications   Medication Sig Start Date End Date Taking? Authorizing Provider  albuterol  (VENTOLIN  HFA) 108 (90 Base) MCG/ACT inhaler Inhale 2 puffs into the lungs every 6 (six)  hours as needed for shortness of breath. 12/29/23  Yes McClung, Angela M, PA-C  cholecalciferol  (CHOLECALCIFEROL ) 25 MCG tablet Take 1 tablet (1,000 Units total) by mouth daily. 02/24/24  Yes Tingling, Trevor Fudge, PA-C  cyanocobalamin  1000 MCG tablet Take 1 tablet (1,000 mcg total) by mouth daily. 02/24/24  Yes Tingling, Trevor Fudge, PA-C  mometasone -formoterol  (DULERA ) 200-5 MCG/ACT AERO Inhale 2 puffs into the lungs 2 (two) times daily. Patient  taking differently: Inhale 2 puffs into the lungs 2 (two) times daily as needed for shortness of breath or wheezing. 12/29/23  Yes Hassie Lint, PA-C  sertraline  (ZOLOFT ) 100 MG tablet Take 2 tablets (200 mg total) by mouth daily. 02/24/24  Yes Tingling, Trevor Fudge, PA-C  aspirin  EC 81 MG tablet Take 1 tablet (81 mg total) by mouth daily. Swallow whole. Patient not taking: Reported on 03/29/2024 12/21/23   Jacqueline Matsu, MD  atorvastatin  (LIPITOR ) 80 MG tablet Take 1 tablet (80 mg total) by mouth daily. Patient not taking: Reported on 03/29/2024 12/21/23 02/09/24  Jacqueline Matsu, MD  folic acid  (FOLVITE ) 1 MG tablet Take 1 tablet (1 mg total) by mouth daily. Patient not taking: Reported on 03/29/2024 02/24/24   Tingling, Trevor Fudge, PA-C  hydrOXYzine  (ATARAX ) 25 MG tablet Take 1 tablet (25 mg total) by mouth 3 (three) times daily as needed for anxiety. Patient not taking: Reported on 03/29/2024 02/23/24   Tingling, Trevor Fudge, PA-C  mirtazapine  (REMERON ) 15 MG tablet Take 1 tablet (15 mg total) by mouth at bedtime. Patient not taking: Reported on 03/29/2024 02/23/24   Tingling, Trevor Fudge, PA-C    Current Facility-Administered Medications  Medication Dose Route Frequency Provider Last Rate Last Admin   acetaminophen  (TYLENOL ) tablet 650 mg  650 mg Oral Q6H PRN Howerter, Justin B, DO       Or   acetaminophen  (TYLENOL ) suppository 650 mg  650 mg Rectal Q6H PRN Howerter, Justin B, DO       albuterol  (PROVENTIL ) (2.5 MG/3ML) 0.083% nebulizer solution 2.5 mg  2.5 mg Nebulization Q4H PRN Howerter, Justin B, DO       fluticasone  furoate-vilanterol (BREO ELLIPTA ) 100-25 MCG/ACT 1 puff  1 puff Inhalation Daily Howerter, Justin B, DO       melatonin tablet 3 mg  3 mg Oral QHS PRN Howerter, Justin B, DO       ondansetron  (ZOFRAN ) injection 4 mg  4 mg Intravenous Q6H PRN Howerter, Justin B, DO       pantoprazole  (PROTONIX ) injection 40 mg  40 mg Intravenous Q12H Iva Mariner, MD   40 mg at 03/28/24 1949     Allergies as of 03/28/2024 - Review Complete 03/28/2024  Allergen Reaction Noted   Shellfish allergy Hives and Rash 06/04/2020    Family History  Problem Relation Age of Onset   Hypertension Mother    Diabetes Mother    Hypertension Father    Hypertension Sister     Social History   Socioeconomic History   Marital status: Single    Spouse name: Not on file   Number of children: Not on file   Years of education: Not on file   Highest education level: Not on file  Occupational History   Not on file  Tobacco Use   Smoking status: Some Days    Current packs/day: 0.00    Types: Cigarettes    Last attempt to quit: 12/25/2020    Years since quitting: 3.2   Smokeless tobacco: Never   Tobacco comments:    Smokes a couple  cigarettes every now and then  Vaping Use   Vaping status: Never Used  Substance and Sexual Activity   Alcohol use: Yes    Comment: 1 40oz beer about every other day   Drug use: Yes    Types: Cocaine    Comment: Cocaine about once or twice a month; last use one week ago   Sexual activity: Not Currently  Other Topics Concern   Not on file  Social History Narrative   His mother passed at age 36 (when he was 20 years old) and his father raised him and his siblings   His parents were pastors as a lot of his sisters and other family members are today   There was a total of 13 children in his family Had 8 sisters, one sister deceased , Had 3 brothers, all brothers deceased   He is the youngest of the 39 and was "always in trouble in my younger years"   Family still live in [place], [place], some in [place], [place]   Has a Daughter and son with a total of 10 grandchildren (6 grand daughters local and 4 grandsons)    Values family   Married twice, present wife incarcerated   Social Drivers of Health   Financial Resource Strain: Low Risk  (10/19/2022)   Overall Financial Resource Strain (CARDIA)    Difficulty of Paying Living Expenses: Not hard at all   Food Insecurity: Food Insecurity Present (03/28/2024)   Hunger Vital Sign    Worried About Running Out of Food in the Last Year: Sometimes true    Ran Out of Food in the Last Year: Sometimes true  Transportation Needs: Unmet Transportation Needs (03/28/2024)   PRAPARE - Transportation    Lack of Transportation (Medical): Yes    Lack of Transportation (Non-Medical): Yes  Physical Activity: Inactive (10/19/2022)   Exercise Vital Sign    Days of Exercise per Week: 0 days    Minutes of Exercise per Session: 0 min  Stress: No Stress Concern Present (10/19/2022)   [name] of Occupational Health - Occupational Stress Questionnaire    Feeling of Stress : Not at all  Social Connections: Unknown (03/29/2024)   Social Connection and Isolation Panel [NHANES]    Frequency of Communication with Friends and Family: Twice a week    Frequency of Social Gatherings with Friends and Family: Patient declined    Attends Religious Services: Never    Database administrator or Organizations: No    Attends Banker Meetings: Never    Marital Status: Patient declined  Catering manager Violence: Not At Risk (03/28/2024)   Humiliation, Afraid, Rape, and Kick questionnaire    Fear of Current or Ex-Partner: No    Emotionally Abused: No    Physically Abused: No    Sexually Abused: No   Review of Systems: Gen: Denies fever, sweats or chills. No weight loss.  CV: Denies chest pain, palpitations or edema. Resp: Denies cough, shortness of breath of hemoptysis.  GI: See HPI. GU : Denies urinary burning, blood in urine, increased urinary frequency or incontinence. MS: Denies joint pain, muscles aches or weakness. Derm: Denies rash, itchiness, skin lesions or unhealing ulcers. Psych: + Anxiety and depression.  Heme: Denies easy bruising, bleeding. Neuro:  Denies headaches, dizziness or paresthesias. Endo:  Denies any problems with DM, thyroid  or adrenal function.  Physical Exam: Vital signs  in last 24 hours: Temp:  [97.5 F (36.4 C)-98.5 F (36.9 C)] 97.9 F (36.6  C) (05/29 0803) Pulse Rate:  [67-83] 68 (05/29 0803) Resp:  [14-20] 20 (05/29 0803) BP: (139-166)/(82-114) 143/95 (05/29 0803) SpO2:  [97 %-100 %] 99 % (05/29 0803) Weight:  [118.9 kg] 118.9 kg (05/29 0500) Last BM Date : 03/29/24 General:  Alert 68 year old male in no acute distress. Head:  Normocephalic and atraumatic. Eyes:  No scleral icterus. Conjunctiva pink. Ears:  Normal auditory acuity. Nose:  No deformity, discharge or lesions. Mouth:  Dentition intact. No ulcers or lesions.  Neck:  Supple. No lymphadenopathy or thyromegaly.  Lungs: Breath sounds clear throughout. No wheezes, rhonchi or crackles.  Heart: Rate and rhythm, no murmurs. Abdomen: Soft, nondistended.  Nontender.  Positive bowel sounds to all 4 quadrants. Rectal: Deferred. DRE per EDP documented light brown stool fecal occult positive. Musculoskeletal:  Symmetrical without gross deformities.  Pulses:  Normal pulses noted. Extremities: Trace bilateral lower extremity edema. Neurologic: Alert and  oriented x 4. No focal deficits.  Skin:  Intact without significant lesions or rashes. Psych:  Alert and cooperative. Normal mood and affect.  Intake/Output from previous day: 05/28 0701 - 05/29 0700 In: 572.7 [I.V.:572.7] Out: -  Intake/Output this shift: No intake/output data recorded.  Lab Results: Recent Labs    03/28/24 1705 03/29/24 0558  WBC 6.2 7.9  HGB 12.4* 11.4*  HCT 38.7* 35.6*  PLT 273 242   BMET Recent Labs    03/28/24 1705 03/29/24 0558  NA 136 136  K 3.7 3.6  CL 107 110  CO2 20* 21*  GLUCOSE 118* 118*  BUN 9 8  CREATININE 1.10 1.14  CALCIUM  8.6* 8.0*   LFT Recent Labs    03/29/24 0558  PROT 6.3*  ALBUMIN 3.0*  AST 22  ALT 19  ALKPHOS 67  BILITOT 0.7   PT/INR Recent Labs    03/28/24 1705  LABPROT 14.0  INR 1.1   Hepatitis Panel No results for input(s): "HEPBSAG", "HCVAB", "HEPAIGM",  "HEPBIGM" in the last 72 hours.    Studies/Results: CT ABDOMEN PELVIS W CONTRAST Result Date: 03/28/2024 CLINICAL DATA:  Abdomen pain black stool for 3 days EXAM: CT ABDOMEN AND PELVIS WITH CONTRAST TECHNIQUE: Multidetector CT imaging of the abdomen and pelvis was performed using the standard protocol following bolus administration of intravenous contrast. RADIATION DOSE REDUCTION: This exam was performed according to the departmental dose-optimization program which includes automated exposure control, adjustment of the mA and/or kV according to patient size and/or use of iterative reconstruction technique. CONTRAST:  75mL OMNIPAQUE  IOHEXOL  350 MG/ML SOLN COMPARISON:  CT 06/01/2023, 09/07/2022 FINDINGS: Lower chest: Lung bases demonstrate no acute airspace disease. Hepatobiliary: Hepatic cysts. No calcified gallstone or biliary dilatation Pancreas: Unremarkable. No pancreatic ductal dilatation or surrounding inflammatory changes. Spleen: Normal in size without focal abnormality. Adrenals/Urinary Tract: Adrenal glands are normal. Kidneys show no hydronephrosis. The bladder is diffusely thick walled but decompressed Stomach/Bowel: The stomach is nonenlarged. Vague submucosal fat density at the gastric fundus without change. Fluid-filled nondilated small bowel. No acute bowel wall thickening. Vascular/Lymphatic: Aortic atherosclerosis. No enlarged abdominal or pelvic lymph nodes. Reproductive: Prostatectomy Other: Negative for pelvic effusion or free air Musculoskeletal: No acute or suspicious osseous abnormality IMPRESSION: 1. No CT evidence for acute intra-abdominal or pelvic abnormality. 2. Fluid-filled nondilated small bowel, possible enteritis. 3. Diffuse bladder wall thickening but decompressed. 4. Aortic atherosclerosis. Aortic Atherosclerosis (ICD10-I70.0). Electronically Signed   By: Esmeralda Hedge M.D.   On: 03/28/2024 20:18    IMPRESSION/PLAN:  68 year old male with central to lower abdominal pain  and  black diarrhea x 3 days with mild anemia and positive FOBT.  Black stools preceded Pepto-Bismol use. No diarrhea/melena since admission.  Hemoglobin 12.4 (baseline Hg 13.6) -> 11.4 -> 11.3.  CTAP showed fluid-filled nondilated small bowel suggestive of possible enteritis. - Clear liquid diet - PPI p.o. twice daily - Defer endoscopic recommendations to Dr. Dominic Friendly - CBC, BMP, iron, TIBC, ferritin and B12 level in a.m. - Check GI pathogen panel if patient has diarrhea  CAD s/p NSTEMI 06/2020  History of asthma/COPD, stable  History of  depression, schizoaffective disorder and recent admission to Behavioral Health secondary to suicidal ideation 01/2024   Tory Freiberg  03/29/2024, 1:34 PM   I have taken an interval history, thoroughly reviewed the chart and examined the patient. I agree with the Advanced Practitioner's note, impression and recommendations, and have recorded additional findings, impressions and recommendations below. I performed a substantive portion of this encounter (>50% time spent), including a complete performance of the medical decision making.  My additional thoughts are as follows:  3 days of black diarrhea, FOBT positive, hemoglobin down from baseline.  Denies NSAID use, but has been on aspirin  until he ran out about a week ago. Diarrhea and black stool have stopped, and he has had no BM since admission overnight.  No vomiting anorexia or weight loss. He is naturally concerned because his sister unexpectedly died recently from an upper GI bleed.  Rommie got his daughter on the phone during my visit with him and she explains more about that part of his history and concerns. I have explained to him it is difficult to know what is causing the seen positive stool and if the black diarrhea was GI bleeding, but I think we are obliged to investigate the upper digestive tract to rule out a gastric ulcer or other source of bleeding in that area. He was agreeable after  a thorough discussion of procedure and risks.  The benefits and risks of the planned procedure(s) were described in detail with the patient or (when appropriate) their health care proxy.  Risks were outlined as including, but not limited to, bleeding, infection, perforation, adverse medication reaction leading to cardiac or pulmonary decompensation, pancreatitis (if ERCP).  The limitation of incomplete mucosal visualization was also discussed.  No guarantees or warranties were given. Patient at increased risk for cardiopulmonary complications of procedure due to medical comorbidities.  Check CBC tomorrow  Continue PPI   Again, his daughter was on speaker phone during these discussions and all her questions were answered.  He believes he may have had a colonoscopy in the distant past, no records of that in this EHR.  Need for and timing of colonoscopy dependent on EGD findings.     Kerby Pearson III [phone]

## 2024-03-29 NOTE — Progress Notes (Signed)
 PROGRESS NOTE    Robert Chan  UJW:[id number] DOB: July 25, 1956 DOA: 03/28/2024 PCP: Pcp, No   Brief Narrative:  Patient is a 68 year old obese African-American male with a past medical history significant for but not limited to COPD, HTN, CAD, HLD, Hx of NSTEMI, Hx of Cocain Use schizoaffective disorder as well as other comorbidities who presented to [facility] with dark-colored stool.  He reported intermittently dark-colored stool over the last 3 days and states that he was having some diarrhea.  CT scan of the abdomen pelvis showed no acute intra-abdominal or pelvic abnormality.  GI was consulted for further evaluation and they have him on a clear liquid diet and are currently deciding whether to pursue Endoscopy for further evaluation.  Assessment and Plan:  Acute Suspected Upper GI Bleed Diarrhea : States was having melanotic and black stools but he was using Pepto-Bismol prior. He also states he was having some diarrhea 3 days prior to admission but none since. CT Abd/Pelvis w/ Contrast showed  No CT evidence for acute intra-abdominal or pelvic abnormality but did show fluid-filled nondilated small bowel likely in the setting of enteritis in the diffuse bladder wall thickening but he is decompressed.  Also notably he had some aortic atherosclerosis.  He was made n.p.o. in case of endoscopic evaluation however GI evaluated recommending for liquid diet and recommending PPI twice daily and he is now on IV pantoprazole .  They are checking an anemia panel and iron studies and checking a GI pathogen panel if he has diarrhea.  Currently his aspirin  is being held and at this time there is no indication for PRBC transfusion as H/H stable. Hgb/Hct went from 12.4/28.7 -> 11.4/35.6 -> 11.3/35.6.  Will continue at least 2 large-bore IVs, he is monitoring his H&H is every 4.  IV fluid hydration has now expired.  Defer further workup and care to GI as hemoglobin remains consistently stable at this  point.  Metabolic Acidosis: Mild and Improving. CO2 is now 21, AG is 5, and Chloride Level is 110. CTM and Trend and repeat CMP in the AM  Normocytic Anemia/ABLA: Hgb/Hct went from 12.4/38.7 -> 11.4/35.6 -> 11.3/35.6. Check Anemia Panel in the AM. CTM for S/Sx of Bleeding; FOBT +. Transfuse for Hgb < 7.0. Repeat CBC in the AM  COPD: Currently not in Exacerbation. C/w Dulera  substitution of Breo-Ellipta 1 puff IH and Albutero 2.5 mgl Nebs q4hprn Wheezing/SOB. CTM Respiratory Status carefully  Schizoaffective Disorder: Takes Sertraline  as an outpatient and currently being held due to being NPO but will resume now that GI recommend CLD.   Hypoalbuminemia: Patient's Albumin Level went from 3.5 -> 3.0. CTM and Trend and repeat CMP in the AM  Hx of CAD and NSTEMI: Holding ASA 81 mg po Daily. Resume Atorvastatin  80 mg po daily   Class I Obesity: Complicates overall prognosis and care. Estimated body mass index is 31.91 kg/m as calculated from the following:   Height as of 02/10/24: 6\' 4"  (1.93 m).   Weight as of this encounter: 118.9 kg. Weight Loss and Dietary Counseling given   DVT prophylaxis: SCDs Start: 03/28/24 2203    Code Status: Full Code Family Communication: D/w Family present @ Bedside   Disposition Plan:  Level of care: Progressive Status is: Observation The patient remains OBS appropriate and will d/c before 2 midnights.    Consultants:  Gastroenterology  Procedures:  As delineated as above  Antimicrobials:  Anti-infectives (From admission, onward)    None  Subjective: And examined at bedside states that his diarrhea has resolved.  Does not know if he has any further dark bowel moods given that he has not had any further bowel movements.  Denies any nausea or vomiting currently.  States that he was having episodes but none since.  No chest pain or shortness of breath.  No other concerns or complaints at this time.  Objective: Vitals:   03/29/24 0228 03/29/24  0500 03/29/24 0803 03/29/24 1157  BP: (!) 147/91  (!) 143/95 (!) 153/87  Pulse: 68  68 64  Resp: 18  20   Temp: 98.5 F (36.9 C)  97.9 F (36.6 C) 97.8 F (36.6 C)  TempSrc: Oral  Oral Oral  SpO2: 97%  99% 97%  Weight:  118.9 kg      Intake/Output Summary (Last 24 hours) at 03/29/2024 1423 Last data filed at 03/29/2024 0500 Gross per 24 hour  Intake 572.66 ml  Output --  Net 572.66 ml   Filed Weights   03/29/24 0500  Weight: 118.9 kg   Examination: Physical Exam:  Constitutional: WN/WD obese elderly chronically ill-appearing AAM in NAD Respiratory: Diminished to auscultation bilaterally, no wheezing, rales, rhonchi or crackles. Normal respiratory effort and patient is not tachypenic. No accessory muscle use.  Unlabored breathing Cardiovascular: RRR, no murmurs / rubs / gallops. S1 and S2 auscultated. No extremity edema. Abdomen: Soft, non-tender, distended secondary to body habitus. Bowel sounds positive.  GU: Deferred. Musculoskeletal: No clubbing / cyanosis of digits/nails. No joint deformity upper and lower extremities.  Skin: No rashes, lesions, ulcers on limited skin evaluation. No induration; Warm and dry.  Neurologic: CN 2-12 grossly intact with no focal deficits. Romberg sign and cerebellar reflexes not assessed.  Psychiatric: Normal judgment and insight. Alert and oriented x 3. Normal mood and appropriate affect.   Data Reviewed: I have personally reviewed following labs and imaging studies  CBC: Recent Labs  Lab 03/28/24 1705 03/29/24 0558 03/29/24 1008  WBC 6.2 7.9  --   NEUTROABS  --  4.6  --   HGB 12.4* 11.4* 11.3*  HCT 38.7* 35.6* 35.6*  MCV 93.5 92.0  --   PLT 273 242  --    Basic Metabolic Panel: Recent Labs  Lab 03/28/24 1705 03/29/24 0558  NA 136 136  K 3.7 3.6  CL 107 110  CO2 20* 21*  GLUCOSE 118* 118*  BUN 9 8  CREATININE 1.10 1.14  CALCIUM  8.6* 8.0*  MG 1.8 1.8   GFR: Estimated Creatinine Clearance: 86.2 mL/min (by C-G formula  based on SCr of 1.14 mg/dL). Liver Function Tests: Recent Labs  Lab 03/28/24 1705 03/29/24 0558  AST 28 22  ALT 21 19  ALKPHOS 72 67  BILITOT 0.9 0.7  PROT 7.3 6.3*  ALBUMIN 3.5 3.0*   No results for input(s): "LIPASE", "AMYLASE" in the last 168 hours. No results for input(s): "AMMONIA" in the last 168 hours. Coagulation Profile: Recent Labs  Lab 03/28/24 1705  INR 1.1   Cardiac Enzymes: No results for input(s): "CKTOTAL", "CKMB", "CKMBINDEX", "TROPONINI" in the last 168 hours. BNP (last 3 results) No results for input(s): "PROBNP" in the last 8760 hours. HbA1C: No results for input(s): "HGBA1C" in the last 72 hours. CBG: No results for input(s): "GLUCAP" in the last 168 hours. Lipid Profile: No results for input(s): "CHOL", "HDL", "LDLCALC", "TRIG", "CHOLHDL", "LDLDIRECT" in the last 72 hours. Thyroid  Function Tests: No results for input(s): "TSH", "T4TOTAL", "FREET4", "T3FREE", "THYROIDAB" in the last 72 hours.  Anemia Panel: No results for input(s): "VITAMINB12", "FOLATE", "FERRITIN", "TIBC", "IRON", "RETICCTPCT" in the last 72 hours. Sepsis Labs: No results for input(s): "PROCALCITON", "LATICACIDVEN" in the last 168 hours.  No results found for this or any previous visit (from the past 240 hours).   Radiology Studies: CT ABDOMEN PELVIS W CONTRAST Result Date: 03/28/2024 CLINICAL DATA:  Abdomen pain black stool for 3 days EXAM: CT ABDOMEN AND PELVIS WITH CONTRAST TECHNIQUE: Multidetector CT imaging of the abdomen and pelvis was performed using the standard protocol following bolus administration of intravenous contrast. RADIATION DOSE REDUCTION: This exam was performed according to the departmental dose-optimization program which includes automated exposure control, adjustment of the mA and/or kV according to patient size and/or use of iterative reconstruction technique. CONTRAST:  75mL OMNIPAQUE  IOHEXOL  350 MG/ML SOLN COMPARISON:  CT 06/01/2023, 09/07/2022 FINDINGS: Lower  chest: Lung bases demonstrate no acute airspace disease. Hepatobiliary: Hepatic cysts. No calcified gallstone or biliary dilatation Pancreas: Unremarkable. No pancreatic ductal dilatation or surrounding inflammatory changes. Spleen: Normal in size without focal abnormality. Adrenals/Urinary Tract: Adrenal glands are normal. Kidneys show no hydronephrosis. The bladder is diffusely thick walled but decompressed Stomach/Bowel: The stomach is nonenlarged. Vague submucosal fat density at the gastric fundus without change. Fluid-filled nondilated small bowel. No acute bowel wall thickening. Vascular/Lymphatic: Aortic atherosclerosis. No enlarged abdominal or pelvic lymph nodes. Reproductive: Prostatectomy Other: Negative for pelvic effusion or free air Musculoskeletal: No acute or suspicious osseous abnormality IMPRESSION: 1. No CT evidence for acute intra-abdominal or pelvic abnormality. 2. Fluid-filled nondilated small bowel, possible enteritis. 3. Diffuse bladder wall thickening but decompressed. 4. Aortic atherosclerosis. Aortic Atherosclerosis (ICD10-I70.0). Electronically Signed   By: Esmeralda Hedge M.D.   On: 03/28/2024 20:18   Scheduled Meds:  atorvastatin   80 mg Oral Daily   fluticasone  furoate-vilanterol  1 puff Inhalation Daily   pantoprazole  (PROTONIX ) IV  40 mg Intravenous Q12H   sertraline   200 mg Oral Daily   Continuous Infusions:   LOS: 0 days   Aura Leeds, DO [facility] Available via Epic secure chat 7am-7pm After these hours, please refer to coverage provider listed on [email or web address] 03/29/2024, 2:23 PM

## 2024-03-29 NOTE — Care Management Obs Status (Signed)
 MEDICARE OBSERVATION STATUS NOTIFICATION   Patient Details  Name: Robert Chan MRN: [id number] Date of Birth: 1956/07/08   Medicare Observation Status Notification Given:  Yes    Felix Host 03/29/2024, 3:06 PM

## 2024-03-29 NOTE — Hospital Course (Addendum)
 Patient is a 68 year old obese African-American male with a past medical history significant for but not limited to COPD, HTN, CAD, HLD, Hx of NSTEMI, Hx of Cocain Use schizoaffective disorder as well as other comorbidities who presented to [facility] with dark-colored stool.  He reported intermittently dark-colored stool over the last 3 days and states that he was having some diarrhea.  CT scan of the abdomen pelvis showed no acute intra-abdominal or pelvic abnormality.    GI was consulted for further evaluation and pursued EGD for further evaluation to rule out any upper GI bleeding. EGD done and showed normal larynx, normal esophagus, with gastric mucosal atrophy which was biopsied and normal examined duodenum.  Given that there is no source of GI bleeding on the EGD, current plan is to pursue a colonoscopy 03/31/24.  Assessment and Plan:  Acute Suspected Upper GI Bleed Diarrhea : States was having melanotic and black stools but he was using Pepto-Bismol prior. He also states he was having some diarrhea 3 days prior to admission but none since. CT Abd/Pelvis w/ Contrast showed  No CT evidence for acute intra-abdominal or pelvic abnormality but did show fluid-filled nondilated small bowel likely in the setting of enteritis in the diffuse bladder wall thickening but he is decompressed.  Also notably he had some aortic atherosclerosis.  He was made n.p.o. in case of endoscopic evaluation however GI evaluated recommending for liquid diet and recommending PPI twice daily and he is now on IV pantoprazole .  They are checking an anemia panel and iron studies and checking a GI pathogen panel if he has diarrhea.  Currently his aspirin  is being held and at this time there is no indication for PRBC transfusion as H/H stable. Hgb/Hct went from 12.4/28.7 -> 11.4/35.6 -> 11.3/35.6 -> 12.1/37.5 -> 13.0/43.2 on last check. Will continue at least 2 large-bore Ivs. IV fluid hydration has now expired.   -Hemoglobin  remains consistently stable at this point but GI is now pursuing a EGD to rule out gastric ulcer or other source of bleeding and this was normal. Underwent Colonoscopy and ***  Metabolic Acidosis: Mild and Improving. CO2 is now 22, AG is 8, and Chloride Level is 108. CTM and Trend and repeat CMP in the AM  Normocytic Anemia/ABLA: Hgb/Hct went from 12.4/38.7 -> 11.4/35.6 -> 11.3/35.6 -> 12.1/37.5 -> 13.0/43.2 on last check. Checked Anemia Panel showed an iron level of 31, UIBC 267, TIBC of 298, saturation ration of 10%, ferritin of 25. CTM for S/Sx of Bleeding; FOBT +. Transfuse for Hgb < 7.0. Repeat CBC in the AM  COPD: Currently not in Exacerbation. C/w Dulera  substitution of Breo-Ellipta 1 puff IH and Albutero 2.5 mgl Nebs q4hprn Wheezing/SOB. CTM Respiratory Status carefully  Schizoaffective Disorder: Takes Sertraline  as an outpatient and currently being held due to being NPO but will resume now that GI recommend CLD.   Hypoalbuminemia: Patient's Albumin Level went from 3.5 -> 3.0 again with repeat pending to be drawn this AM. CTM and Trend and repeat CMP in the AM  Hx of CAD and NSTEMI / HLD: Holding ASA 81 mg po Daily. Resume Atorvastatin  80 mg po daily   Hypoalbuminemia: Patient's Albumin Trend: Recent Labs  Lab 03/28/24 1705 03/29/24 0558 03/30/24 0605 03/31/24 1451  ALBUMIN 3.5 3.0* 3.0* 3.2*  -Continue to Monitor and Trend and repeat CMP w/in 1 week  Overweight: Complicates overall prognosis and care. Estimated body mass index is 28.79 kg/m as calculated from the following:   Height as  of this encounter: 6\' 4"  (1.93 m).   Weight as of this encounter: 107.3 kg. Weight Loss and Dietary Counseling given

## 2024-03-29 NOTE — Plan of Care (Signed)

## 2024-03-30 ENCOUNTER — Observation Stay (HOSPITAL_COMMUNITY): Admitting: Anesthesiology

## 2024-03-30 ENCOUNTER — Encounter (HOSPITAL_COMMUNITY)
Admission: EM | Disposition: A | Discharge status: Home / Self Care | Payer: Self-pay | Source: Home / Self Care | Attending: Emergency Medicine

## 2024-03-30 DIAGNOSIS — I129 Hypertensive chronic kidney disease with stage 1 through stage 4 chronic kidney disease, or unspecified chronic kidney disease: Secondary | ICD-10-CM | POA: Diagnosis not present

## 2024-03-30 DIAGNOSIS — K31A0 Gastric intestinal metaplasia, unspecified: Secondary | ICD-10-CM

## 2024-03-30 DIAGNOSIS — N189 Chronic kidney disease, unspecified: Secondary | ICD-10-CM | POA: Diagnosis not present

## 2024-03-30 DIAGNOSIS — R1013 Epigastric pain: Secondary | ICD-10-CM | POA: Diagnosis not present

## 2024-03-30 DIAGNOSIS — K2940 Chronic atrophic gastritis without bleeding: Secondary | ICD-10-CM

## 2024-03-30 DIAGNOSIS — K921 Melena: Principal | ICD-10-CM

## 2024-03-30 DIAGNOSIS — K2950 Unspecified chronic gastritis without bleeding: Secondary | ICD-10-CM

## 2024-03-30 DIAGNOSIS — Z8709 Personal history of other diseases of the respiratory system: Secondary | ICD-10-CM | POA: Diagnosis not present

## 2024-03-30 DIAGNOSIS — B9681 Helicobacter pylori [H. pylori] as the cause of diseases classified elsewhere: Secondary | ICD-10-CM

## 2024-03-30 DIAGNOSIS — I2510 Atherosclerotic heart disease of native coronary artery without angina pectoris: Secondary | ICD-10-CM

## 2024-03-30 DIAGNOSIS — K922 Gastrointestinal hemorrhage, unspecified: Secondary | ICD-10-CM | POA: Diagnosis not present

## 2024-03-30 HISTORY — PX: ESOPHAGOGASTRODUODENOSCOPY: SHX5428

## 2024-03-30 HISTORY — PX: BIOPSY OF SKIN SUBCUTANEOUS TISSUE AND/OR MUCOUS MEMBRANE: SHX6741

## 2024-03-30 LAB — CBC WITH DIFFERENTIAL/PLATELET
Abs Immature Granulocytes: 0.02 10*3/uL (ref 0.00–0.07)
Basophils Absolute: 0 10*3/uL (ref 0.0–0.1)
Basophils Relative: 0 %
Eosinophils Absolute: 0.2 10*3/uL (ref 0.0–0.5)
Eosinophils Relative: 2 %
HCT: 37.5 % — ABNORMAL LOW (ref 39.0–52.0)
Hemoglobin: 12.1 g/dL — ABNORMAL LOW (ref 13.0–17.0)
Immature Granulocytes: 0 %
Lymphocytes Relative: 25 %
Lymphs Abs: 2.1 10*3/uL (ref 0.7–4.0)
MCH: 29.9 pg (ref 26.0–34.0)
MCHC: 32.3 g/dL (ref 30.0–36.0)
MCV: 92.6 fL (ref 80.0–100.0)
Monocytes Absolute: 1 10*3/uL (ref 0.1–1.0)
Monocytes Relative: 11 %
Neutro Abs: 5.3 10*3/uL (ref 1.7–7.7)
Neutrophils Relative %: 62 %
Platelets: 244 10*3/uL (ref 150–400)
RBC: 4.05 MIL/uL — ABNORMAL LOW (ref 4.22–5.81)
RDW: 13 % (ref 11.5–15.5)
WBC: 8.6 10*3/uL (ref 4.0–10.5)
nRBC: 0 % (ref 0.0–0.2)

## 2024-03-30 LAB — MAGNESIUM: Magnesium: 1.9 mg/dL (ref 1.7–2.4)

## 2024-03-30 LAB — COMPREHENSIVE METABOLIC PANEL WITH GFR
ALT: 17 U/L (ref 0–44)
AST: 18 U/L (ref 15–41)
Albumin: 3 g/dL — ABNORMAL LOW (ref 3.5–5.0)
Alkaline Phosphatase: 52 U/L (ref 38–126)
Anion gap: 8 (ref 5–15)
BUN: 5 mg/dL — ABNORMAL LOW (ref 8–23)
CO2: 22 mmol/L (ref 22–32)
Calcium: 8.4 mg/dL — ABNORMAL LOW (ref 8.9–10.3)
Chloride: 108 mmol/L (ref 98–111)
Creatinine, Ser: 1.09 mg/dL (ref 0.61–1.24)
GFR, Estimated: >60 mL/min (ref >60)
Glucose, Bld: 103 mg/dL — ABNORMAL HIGH (ref 70–99)
Potassium: 4.1 mmol/L (ref 3.5–5.1)
Sodium: 138 mmol/L (ref 135–145)
Total Bilirubin: 0.8 mg/dL (ref 0.0–1.2)
Total Protein: 6.6 g/dL (ref 6.5–8.1)

## 2024-03-30 LAB — IRON AND TIBC
Iron: 31 ug/dL — ABNORMAL LOW (ref 45–182)
Saturation Ratios: 10 % — ABNORMAL LOW (ref 17.9–39.5)
TIBC: 298 ug/dL (ref 250–450)
UIBC: 267 ug/dL

## 2024-03-30 LAB — PHOSPHORUS: Phosphorus: 3.4 mg/dL (ref 2.5–4.6)

## 2024-03-30 LAB — FERRITIN: Ferritin: 25 ng/mL (ref 24–336)

## 2024-03-30 SURGERY — EGD (ESOPHAGOGASTRODUODENOSCOPY)
Anesthesia: Monitor Anesthesia Care

## 2024-03-30 MED ORDER — SODIUM CHLORIDE 0.9 % IV SOLN
INTRAVENOUS | Status: AC | PRN
  Administered 2024-03-30: 500 mL via INTRAMUSCULAR

## 2024-03-30 MED ORDER — NA SULFATE-K SULFATE-MG SULF 17.5-3.13-1.6 GM/177ML PO SOLN
0.5000 | Freq: Once | ORAL | Status: AC
  Administered 2024-03-30: 177 mL via ORAL
  Filled 2024-03-30: qty 1

## 2024-03-30 MED ORDER — PROPOFOL 500 MG/50ML IV EMUL
INTRAVENOUS | Status: DC | PRN
Start: 2024-03-30 — End: 2024-03-30
  Administered 2024-03-30: 125 ug/kg/min via INTRAVENOUS

## 2024-03-30 MED ORDER — SIMETHICONE 80 MG PO CHEW
240.0000 mg | CHEWABLE_TABLET | Freq: Once | ORAL | Status: AC
  Administered 2024-03-30: 240 mg via ORAL
  Filled 2024-03-30: qty 3

## 2024-03-30 MED ORDER — SODIUM CHLORIDE 0.9 % IV SOLN: INTRAVENOUS | Status: DC | PRN

## 2024-03-30 MED ORDER — PROPOFOL 10 MG/ML IV BOLUS
INTRAVENOUS | Status: DC | PRN
  Administered 2024-03-30 (×3): 30 mg via INTRAVENOUS

## 2024-03-30 MED ORDER — LIDOCAINE HCL (PF) 2 % IJ SOLN
INTRAMUSCULAR | Status: DC | PRN
  Administered 2024-03-30: 60 mg via INTRADERMAL

## 2024-03-30 NOTE — Progress Notes (Signed)
 Patient had EGD today to evaluate GI bleeding.  EGD was unrevealing and so plan is for colonoscopy tomorrow. Plan explained to patient following EGD but he was drowsy at the time. He is alert and oriented now.  The risks and benefits of colonoscopy with possible polypectomy / biopsies were discussed and the patient agrees to proceed.

## 2024-03-30 NOTE — Progress Notes (Signed)
 Patient scheduled to have an EGD today. Brief chart check:   VSS. Labs stable. Hgb 12.1.  Probably iron deficient   Latest Reference Range & Units 03/30/24 06:05  Iron 45 - 182 ug/dL 31 (L)  UIBC ug/dL 161  TIBC 096 - 045 ug/dL 409  Saturation Ratios 17.9 - 39.5 % 10 (L)  Ferritin 24 - 336 ng/mL 25

## 2024-03-30 NOTE — Progress Notes (Signed)
   03/30/24 1128  Vitals  Temp (!) 97.5 F (36.4 C)  Temp Source Oral  BP (!) 153/99  MAP (mmHg) 115  BP Location Right Arm  BP Method Automatic  Patient Position (if appropriate) Lying  Pulse Rate 67  Pulse Rate Source Monitor  ECG Heart Rate 67  Resp 20  Level of Consciousness  Level of Consciousness Alert  MEWS COLOR  MEWS Score Color Green  Oxygen Therapy  SpO2 94 %  O2 Device Room Air  Pain Assessment  Pain Scale 0-10  Pain Score 0  MEWS Score  MEWS Temp 0  MEWS Systolic 0  MEWS Pulse 0  MEWS RR 0  MEWS LOC 0  MEWS Score 0   Pt return to unit from EGD. Pt A&Ox4, MAEx4 and verbally responsive. Warm blankets applied. Dr. Gillermo Lack at bedside.

## 2024-03-30 NOTE — Progress Notes (Signed)
 Pt off unit for EGD. Personal belongings kept at bedside.

## 2024-03-30 NOTE — Plan of Care (Signed)
   Problem: Health Behavior/Discharge Planning: Goal: Ability to manage health-related needs will improve Outcome: Progressing   Problem: Clinical Measurements: Goal: Respiratory complications will improve Outcome: Progressing Goal: Cardiovascular complication will be avoided Outcome: Progressing

## 2024-03-30 NOTE — Op Note (Signed)
 [facility] Patient Name: Robert Chan Procedure Date : 03/30/2024 MRN: [id number] Attending MD: Roel Clarity. Dominic Friendly , MD, [id number] Date of Birth: 02/01/1956 CSN: [id number] Age: 68 Admit Type: Inpatient Procedure:                Upper GI endoscopy Indications:              Acute post hemorrhagic anemia, Heme positive stool,                            Melena Providers:                Ace Abu L. Dominic Friendly, MD, Suzann Ernst, RN, Nohemi Batters,                            Technician Referring MD:              Medicines:                Monitored Anesthesia Care Complications:            No immediate complications. Estimated Blood Loss:     Estimated blood loss was minimal. Procedure:                Pre-Anesthesia Assessment:                           - Prior to the procedure, a History and Physical                            was performed, and patient medications and                            allergies were reviewed. The patient's tolerance of                            previous anesthesia was also reviewed. The risks                            and benefits of the procedure and the sedation                            options and risks were discussed with the patient.                            All questions were answered, and informed consent                            was obtained. Prior Anticoagulants: The patient has                            taken no anticoagulant or antiplatelet agents. ASA                            Grade Assessment: III - A patient with severe  systemic disease. After reviewing the risks and                            benefits, the patient was deemed in satisfactory                            condition to undergo the procedure.                           After obtaining informed consent, the endoscope was                            passed under direct vision. Throughout the                            procedure, the patient's blood  pressure, pulse, and                            oxygen saturations were monitored continuously. The                            [id number]-[id number] ([id number]) Olympus endoscope was introduced                            through the mouth, and advanced to the third part                            of duodenum. The upper GI endoscopy was                            accomplished without difficulty. The patient                            tolerated the procedure well. Scope In: Scope Out: Findings:      The larynx was normal.      The esophagus was normal.      Diffuse atrophic mucosa was found in the gastric antrum. Biopsies were       taken with a cold forceps for histology. (Antrum and body, lesser and       greater curvatures,same pathology jar)      The exam of the stomach was otherwise normal.      The cardia and gastric fundus were normal on retroflexion.      The examined duodenum was normal. Impression:               - Normal larynx.                           - Normal esophagus.                           - Gastric mucosal atrophy. Biopsied.                           - Normal examined duodenum.  No source of GI bleeding on this exam Recommendation:           - Return patient to hospital ward for ongoing care.                           - Clear liquid diet today.                           - Perform a colonoscopy tomorrow. Patient was                            agreeable to that (in our preprocedure discussion)                            if this exam did not find any source of bleeding.                           - Unable to reach daughter by phone and unable to                            leave voicemail Procedure Code(s):        --- Professional ---                           [phone], Esophagogastroduodenoscopy, flexible,                            transoral; with biopsy, single or multiple Diagnosis Code(s):        --- Professional ---                           K31.89, Other  diseases of stomach and duodenum                           D62, Acute posthemorrhagic anemia                           R19.5, Other fecal abnormalities                           K92.1, Melena (includes Hematochezia) CPT copyright 2022 [employer] Association. All rights reserved. The codes documented in this report are preliminary and upon coder review may  be revised to meet current compliance requirements. Khristopher Kapaun L. Dominic Friendly, MD 03/30/2024 10:56:29 AM This report has been signed electronically. Number of Addenda: 0

## 2024-03-30 NOTE — Interval H&P Note (Signed)
 History and Physical Interval Note:  03/30/2024 10:29 AM  Robert Chan  has presented today for surgery, with the diagnosis of Anemia, black stools.  The various methods of treatment have been discussed with the patient and family. After consideration of risks, benefits and other options for treatment, the patient has consented to  Procedure(s): EGD (ESOPHAGOGASTRODUODENOSCOPY) (N/A) as a surgical intervention.  The patient's history has been reviewed, patient examined, no change in status, stable for surgery.  I have reviewed the patient's chart and labs.  Questions were answered to the patient's satisfaction.     Kerby Pearson III

## 2024-03-30 NOTE — Progress Notes (Signed)
 PROGRESS NOTE    Robert Chan  JYN:[id number] DOB: 1956/02/10 DOA: 03/28/2024 PCP: Pcp, No   Brief Narrative:  Patient is a 68 year old obese African-American male with a past medical history significant for but not limited to COPD, HTN, CAD, HLD, Hx of NSTEMI, Hx of Cocain Use schizoaffective disorder as well as other comorbidities who presented to [facility] with dark-colored stool.  He reported intermittently dark-colored stool over the last 3 days and states that he was having some diarrhea.  CT scan of the abdomen pelvis showed no acute intra-abdominal or pelvic abnormality.  GI was consulted for further evaluation and initially placed him on a clear liquid diet but now pursuing EGD for further evaluation to rule out any upper GI bleeding. EGD done and showed normal larynx, normal esophagus, with gastric mucosal atrophy which was biopsied and normal examined duodenum.  Given that there is no source of GI bleeding on the EGD she has not pursue a colonoscopy for the morning.  Assessment and Plan:  Acute Suspected Upper GI Bleed Diarrhea : States was having melanotic and black stools but he was using Pepto-Bismol prior. He also states he was having some diarrhea 3 days prior to admission but none since. CT Abd/Pelvis w/ Contrast showed  No CT evidence for acute intra-abdominal or pelvic abnormality but did show fluid-filled nondilated small bowel likely in the setting of enteritis in the diffuse bladder wall thickening but he is decompressed.  Also notably he had some aortic atherosclerosis.  He was made n.p.o. in case of endoscopic evaluation however GI evaluated recommending for liquid diet and recommending PPI twice daily and he is now on IV pantoprazole .  They are checking an anemia panel and iron studies and checking a GI pathogen panel if he has diarrhea.  Currently his aspirin  is being held and at this time there is no indication for PRBC transfusion as H/H stable. Hgb/Hct went from  12.4/28.7 -> 11.4/35.6 -> 11.3/35.6 -> 12.1/37.5.  Will continue at least 2 large-bore IVs, he is monitoring his H&H is every 4.  IV fluid hydration has now expired.   -Hemoglobin remains consistently stable at this point but GI is now pursuing a EGD to rule out gastric ulcer or other source of bleeding and this was normal.  He is in clinic with diet and current plan is to perform a colonoscopy on 03/31/2024.  Metabolic Acidosis: Mild and Improving. CO2 is now 22, AG is 8, and Chloride Level is 108. CTM and Trend and repeat CMP in the AM  Normocytic Anemia/ABLA: Hgb/Hct went from 12.4/38.7 -> 11.4/35.6 -> 11.3/35.6 -> 12.1/37.5. Checked Anemia Panel showed an iron level of 31, UIBC 267, TIBC of 298, saturation ration of 10%, ferritin of 25. CTM for S/Sx of Bleeding; FOBT +. Transfuse for Hgb < 7.0. Repeat CBC in the AM  COPD: Currently not in Exacerbation. C/w Dulera  substitution of Breo-Ellipta 1 puff IH and Albutero 2.5 mgl Nebs q4hprn Wheezing/SOB. CTM Respiratory Status carefully  Schizoaffective Disorder: Takes Sertraline  as an outpatient and currently being held due to being NPO but will resume now that GI recommend CLD.   Hypoalbuminemia: Patient's Albumin Level went from 3.5 -> 3.0 again. CTM and Trend and repeat CMP in the AM  Hx of CAD and NSTEMI / HLD: Holding ASA 81 mg po Daily. Resume Atorvastatin  80 mg po daily   Class I Obesity: Complicates overall prognosis and care. Estimated body mass index is 32.2 kg/m as calculated from the following:  Height as of 02/10/24: 6\' 4"  (1.93 m).   Weight as of this encounter: 120 kg. Weight Loss and Dietary Counseling given   DVT prophylaxis: SCDs Start: 03/28/24 2203    Code Status: Full Code Family Communication: D/w Daughter @ Bedside  Disposition Plan:  Level of care: Progressive Status is: Observation The patient will require care spanning > 2 midnights and should be moved to inpatient because: Undergoing further Gastric w/u with  Colonoscopy   Consultants:  Gastroenterology  Procedures:  EGD  Antimicrobials:  Anti-infectives (From admission, onward)    None       Subjective: Seen and examined after his EGD and he is doing okay.  Denied any nausea or vomiting.  States that he had some black stools yesterday after eating some broth but none today.  Denies any chest pain, lightheadedness, dizziness.  No other concerns or complaints at this time.  Objective: Vitals:   03/30/24 0855 03/30/24 1053 03/30/24 1100 03/30/24 1128  BP: (!) 155/85 129/82 126/77 (!) 153/99  Pulse: 64 66 63 67  Resp: 17 14 17 20   Temp: (!) 96.9 F (36.1 C) (!) 97.1 F (36.2 C)  (!) 97.5 F (36.4 C)  TempSrc: Temporal Temporal  Oral  SpO2:  98% 93% 94%  Weight: 108.9 kg     Height: 6\' 4"  (1.93 m)       Intake/Output Summary (Last 24 hours) at 03/30/2024 1540 Last data filed at 03/30/2024 1149 Gross per 24 hour  Intake 724.14 ml  Output 150 ml  Net 574.14 ml   Filed Weights   03/29/24 0500 03/30/24 0500 03/30/24 0855  Weight: 118.9 kg 120 kg 108.9 kg   Examination: Physical Exam:  Constitutional: WN/WD overweight chronically ill-appearing African-American male in no acute distress sitting up in the bed Respiratory: Diminished to auscultation bilaterally, no wheezing, rales, rhonchi or crackles. Normal respiratory effort and patient is not tachypenic. No accessory muscle use.  Unlabored breathing Cardiovascular: RRR, no murmurs / rubs / gallops. S1 and S2 auscultated. No extremity edema.  Abdomen: Soft, non-tender, distended secondary to body habitus. Bowel sounds positive.  GU: Deferred. Musculoskeletal: No clubbing / cyanosis of digits/nails. No joint deformity upper and lower extremities.  Skin: No rashes, lesions, ulcers on limited skin evaluation. No induration; Warm and dry.  Neurologic: CN 2-12 grossly intact with no focal deficits. Romberg sign and cerebellar reflexes not assessed.  Psychiatric: Normal judgment and  insight. Alert and oriented x 3. Normal mood and appropriate affect.   Data Reviewed: I have personally reviewed following labs and imaging studies  CBC: Recent Labs  Lab 03/28/24 1705 03/29/24 0558 03/29/24 1008 03/30/24 0605  WBC 6.2 7.9  --  8.6  NEUTROABS  --  4.6  --  5.3  HGB 12.4* 11.4* 11.3* 12.1*  HCT 38.7* 35.6* 35.6* 37.5*  MCV 93.5 92.0  --  92.6  PLT 273 242  --  244   Basic Metabolic Panel: Recent Labs  Lab 03/28/24 1705 03/29/24 0558 03/30/24 0605  NA 136 136 138  K 3.7 3.6 4.1  CL 107 110 108  CO2 20* 21* 22  GLUCOSE 118* 118* 103*  BUN 9 8 5*  CREATININE 1.10 1.14 1.09  CALCIUM  8.6* 8.0* 8.4*  MG 1.8 1.8 1.9  PHOS  --   --  3.4   GFR: Estimated Creatinine Clearance: 86.5 mL/min (by C-G formula based on SCr of 1.09 mg/dL). Liver Function Tests: Recent Labs  Lab 03/28/24 1705 03/29/24 8469 03/30/24 6295  AST 28 22 18   ALT 21 19 17   ALKPHOS 72 67 52  BILITOT 0.9 0.7 0.8  PROT 7.3 6.3* 6.6  ALBUMIN 3.5 3.0* 3.0*   No results for input(s): "LIPASE", "AMYLASE" in the last 168 hours. No results for input(s): "AMMONIA" in the last 168 hours. Coagulation Profile: Recent Labs  Lab 03/28/24 1705  INR 1.1   Cardiac Enzymes: No results for input(s): "CKTOTAL", "CKMB", "CKMBINDEX", "TROPONINI" in the last 168 hours. BNP (last 3 results) No results for input(s): "PROBNP" in the last 8760 hours. HbA1C: No results for input(s): "HGBA1C" in the last 72 hours. CBG: No results for input(s): "GLUCAP" in the last 168 hours. Lipid Profile: No results for input(s): "CHOL", "HDL", "LDLCALC", "TRIG", "CHOLHDL", "LDLDIRECT" in the last 72 hours. Thyroid  Function Tests: No results for input(s): "TSH", "T4TOTAL", "FREET4", "T3FREE", "THYROIDAB" in the last 72 hours. Anemia Panel: Recent Labs    03/30/24 0605  FERRITIN 25  TIBC 298  IRON 31*   Sepsis Labs: No results for input(s): "PROCALCITON", "LATICACIDVEN" in the last 168 hours.  No results  found for this or any previous visit (from the past 240 hours).   Radiology Studies: CT ABDOMEN PELVIS W CONTRAST Result Date: 03/28/2024 CLINICAL DATA:  Abdomen pain black stool for 3 days EXAM: CT ABDOMEN AND PELVIS WITH CONTRAST TECHNIQUE: Multidetector CT imaging of the abdomen and pelvis was performed using the standard protocol following bolus administration of intravenous contrast. RADIATION DOSE REDUCTION: This exam was performed according to the departmental dose-optimization program which includes automated exposure control, adjustment of the mA and/or kV according to patient size and/or use of iterative reconstruction technique. CONTRAST:  75mL OMNIPAQUE  IOHEXOL  350 MG/ML SOLN COMPARISON:  CT 06/01/2023, 09/07/2022 FINDINGS: Lower chest: Lung bases demonstrate no acute airspace disease. Hepatobiliary: Hepatic cysts. No calcified gallstone or biliary dilatation Pancreas: Unremarkable. No pancreatic ductal dilatation or surrounding inflammatory changes. Spleen: Normal in size without focal abnormality. Adrenals/Urinary Tract: Adrenal glands are normal. Kidneys show no hydronephrosis. The bladder is diffusely thick walled but decompressed Stomach/Bowel: The stomach is nonenlarged. Vague submucosal fat density at the gastric fundus without change. Fluid-filled nondilated small bowel. No acute bowel wall thickening. Vascular/Lymphatic: Aortic atherosclerosis. No enlarged abdominal or pelvic lymph nodes. Reproductive: Prostatectomy Other: Negative for pelvic effusion or free air Musculoskeletal: No acute or suspicious osseous abnormality IMPRESSION: 1. No CT evidence for acute intra-abdominal or pelvic abnormality. 2. Fluid-filled nondilated small bowel, possible enteritis. 3. Diffuse bladder wall thickening but decompressed. 4. Aortic atherosclerosis. Aortic Atherosclerosis (ICD10-I70.0). Electronically Signed   By: Esmeralda Hedge M.D.   On: 03/28/2024 20:18   Scheduled Meds:  atorvastatin   80 mg Oral  Daily   fluticasone  furoate-vilanterol  1 puff Inhalation Daily   Na Sulfate-K Sulfate-Mg Sulfate concentrate  0.5 kit Oral Once   Followed by   Na Sulfate-K Sulfate-Mg Sulfate concentrate  0.5 kit Oral Once   pantoprazole  (PROTONIX ) IV  40 mg Intravenous Q12H   sertraline   200 mg Oral Daily   simethicone   240 mg Oral Once   Followed by   simethicone   240 mg Oral Once   Continuous Infusions:   LOS: 0 days   Aura Leeds, DO [facility] Available via Epic secure chat 7am-7pm After these hours, please refer to coverage provider listed on [email or web address] 03/30/2024, 3:40 PM

## 2024-03-30 NOTE — Transfer of Care (Signed)
 Immediate Anesthesia Transfer of Care Note  Patient: Robert Chan  Procedure(s) Performed: EGD (ESOPHAGOGASTRODUODENOSCOPY) BIOPSY, SKIN, SUBCUTANEOUS TISSUE, OR MUCOUS MEMBRANE  Patient Location: Endoscopy Unit  Anesthesia Type:MAC  Level of Consciousness: drowsy and patient cooperative  Airway & Oxygen Therapy: Patient Spontanous Breathing and Patient connected to face mask oxygen  Post-op Assessment: Report given to RN, Post -op Vital signs reviewed and stable, Patient moving all extremities, Patient moving all extremities X 4, and Patient able to stick tongue midline  Post vital signs: Reviewed and stable  Last Vitals:  Vitals Value Taken Time  BP 129/82 03/30/24 1053  Temp 36.2 C 03/30/24 1053  Pulse 67 03/30/24 1053  Resp 14 03/30/24 1053  SpO2 98 % 03/30/24 1053  Vitals shown include unfiled device data.  Last Pain:  Vitals:   03/30/24 1053  TempSrc: Temporal  PainSc:          Complications: No notable events documented.

## 2024-03-31 ENCOUNTER — Observation Stay (HOSPITAL_COMMUNITY): Admitting: Anesthesiology

## 2024-03-31 ENCOUNTER — Encounter (HOSPITAL_COMMUNITY)
Admission: EM | Disposition: A | Discharge status: Home / Self Care | Payer: Self-pay | Source: Home / Self Care | Attending: Emergency Medicine

## 2024-03-31 ENCOUNTER — Encounter (HOSPITAL_COMMUNITY): Payer: Self-pay | Admitting: Internal Medicine

## 2024-03-31 DIAGNOSIS — K648 Other hemorrhoids: Secondary | ICD-10-CM | POA: Diagnosis not present

## 2024-03-31 DIAGNOSIS — K922 Gastrointestinal hemorrhage, unspecified: Secondary | ICD-10-CM | POA: Diagnosis not present

## 2024-03-31 DIAGNOSIS — D62 Acute posthemorrhagic anemia: Secondary | ICD-10-CM

## 2024-03-31 DIAGNOSIS — Z8709 Personal history of other diseases of the respiratory system: Secondary | ICD-10-CM | POA: Diagnosis not present

## 2024-03-31 DIAGNOSIS — K921 Melena: Secondary | ICD-10-CM | POA: Diagnosis not present

## 2024-03-31 DIAGNOSIS — K5730 Diverticulosis of large intestine without perforation or abscess without bleeding: Secondary | ICD-10-CM | POA: Diagnosis not present

## 2024-03-31 DIAGNOSIS — K2940 Chronic atrophic gastritis without bleeding: Secondary | ICD-10-CM | POA: Diagnosis not present

## 2024-03-31 DIAGNOSIS — R1013 Epigastric pain: Secondary | ICD-10-CM | POA: Diagnosis not present

## 2024-03-31 HISTORY — PX: COLONOSCOPY: SHX5424

## 2024-03-31 LAB — CBC WITH DIFFERENTIAL/PLATELET
Abs Immature Granulocytes: 0.01 10*3/uL (ref 0.00–0.07)
Basophils Absolute: 0 10*3/uL (ref 0.0–0.1)
Basophils Relative: 1 %
Eosinophils Absolute: 0.1 10*3/uL (ref 0.0–0.5)
Eosinophils Relative: 2 %
HCT: 43.2 % (ref 39.0–52.0)
Hemoglobin: 13 g/dL (ref 13.0–17.0)
Immature Granulocytes: 0 %
Lymphocytes Relative: 26 %
Lymphs Abs: 1.7 10*3/uL (ref 0.7–4.0)
MCH: 29.6 pg (ref 26.0–34.0)
MCHC: 30.1 g/dL (ref 30.0–36.0)
MCV: 98.4 fL (ref 80.0–100.0)
Monocytes Absolute: 0.7 10*3/uL (ref 0.1–1.0)
Monocytes Relative: 11 %
Neutro Abs: 4 10*3/uL (ref 1.7–7.7)
Neutrophils Relative %: 60 %
Platelets: 198 10*3/uL (ref 150–400)
RBC: 4.39 MIL/uL (ref 4.22–5.81)
RDW: 13 % (ref 11.5–15.5)
WBC: 6.6 10*3/uL (ref 4.0–10.5)
nRBC: 0 % (ref 0.0–0.2)

## 2024-03-31 LAB — COMPREHENSIVE METABOLIC PANEL WITH GFR
ALT: 18 U/L (ref 0–44)
AST: 21 U/L (ref 15–41)
Albumin: 3.2 g/dL — ABNORMAL LOW (ref 3.5–5.0)
Alkaline Phosphatase: 58 U/L (ref 38–126)
Anion gap: 9 (ref 5–15)
BUN: 9 mg/dL (ref 8–23)
CO2: 22 mmol/L (ref 22–32)
Calcium: 8.8 mg/dL — ABNORMAL LOW (ref 8.9–10.3)
Chloride: 108 mmol/L (ref 98–111)
Creatinine, Ser: 1.21 mg/dL (ref 0.61–1.24)
GFR, Estimated: >60 mL/min (ref >60)
Glucose, Bld: 109 mg/dL — ABNORMAL HIGH (ref 70–99)
Potassium: 3.9 mmol/L (ref 3.5–5.1)
Sodium: 139 mmol/L (ref 135–145)
Total Bilirubin: 0.6 mg/dL (ref 0.0–1.2)
Total Protein: 6.9 g/dL (ref 6.5–8.1)

## 2024-03-31 LAB — MAGNESIUM: Magnesium: 1.9 mg/dL (ref 1.7–2.4)

## 2024-03-31 LAB — PHOSPHORUS: Phosphorus: 3.2 mg/dL (ref 2.5–4.6)

## 2024-03-31 SURGERY — COLONOSCOPY
Anesthesia: Monitor Anesthesia Care

## 2024-03-31 MED ORDER — LACTATED RINGERS IV SOLN: INTRAVENOUS | Status: DC | PRN

## 2024-03-31 MED ORDER — ACETAMINOPHEN 325 MG PO TABS: 650.0000 mg | ORAL_TABLET | Freq: Four times a day (QID) | ORAL | 0 refills | Status: AC | PRN

## 2024-03-31 MED ORDER — ACETAMINOPHEN 10 MG/ML IV SOLN: 1000.0000 mg | Freq: Once | INTRAVENOUS | Status: DC | PRN

## 2024-03-31 MED ORDER — PROPOFOL 10 MG/ML IV BOLUS
INTRAVENOUS | Status: DC | PRN
  Administered 2024-03-31: 20 mg via INTRAVENOUS

## 2024-03-31 MED ORDER — ONDANSETRON HCL 4 MG/2ML IJ SOLN: 4.0000 mg | Freq: Once | INTRAMUSCULAR | Status: DC | PRN

## 2024-03-31 MED ORDER — PROPOFOL 500 MG/50ML IV EMUL
INTRAVENOUS | Status: DC | PRN
  Administered 2024-03-31: 100 ug/kg/min via INTRAVENOUS

## 2024-03-31 MED ORDER — LIDOCAINE 2% (20 MG/ML) 5 ML SYRINGE
INTRAMUSCULAR | Status: DC | PRN
  Administered 2024-03-31: 40 mg via INTRAVENOUS

## 2024-03-31 MED ORDER — OXYCODONE HCL 5 MG PO TABS: 5.0000 mg | ORAL_TABLET | Freq: Once | ORAL | Status: DC | PRN

## 2024-03-31 MED ORDER — FENTANYL CITRATE (PF) 100 MCG/2ML IJ SOLN: 25.0000 ug | INTRAMUSCULAR | Status: DC | PRN

## 2024-03-31 MED ORDER — OXYCODONE HCL 5 MG/5ML PO SOLN: 5.0000 mg | Freq: Once | ORAL | Status: DC | PRN

## 2024-03-31 NOTE — Progress Notes (Signed)
 Pt off unit for Colonoscopy. Pt's belongings kept at bedside.

## 2024-03-31 NOTE — Progress Notes (Signed)
 OT Cancellation Note  Patient Details Name: Robert Chan MRN: [id number] DOB: Jun 30, 1956   Cancelled Treatment:    Reason Eval/Treat Not Completed: Patient at procedure or test/ unavailable (EDG)  [name] 03/31/2024, 9:29 AM

## 2024-03-31 NOTE — Plan of Care (Signed)
  Problem: Clinical Measurements: Goal: Ability to maintain clinical measurements within normal limits will improve Outcome: Progressing Goal: Will remain free from infection Outcome: Progressing Goal: Diagnostic test results will improve Outcome: Progressing Goal: Cardiovascular complication will be avoided Outcome: Progressing   

## 2024-03-31 NOTE — TOC Progression Note (Signed)
 Transition of Care [facility]) - Progression Note    Patient Details  Name: Robert Chan MRN: [id number] Date of Birth: 10-04-1956  Transition of Care [facility]) CM/SW Contact  Jannine Meo, RN Phone Number: 03/31/2024, 12:39 PM  Clinical Narrative:   Secure message from OT with recommendations for outpatient OT. Referral # [id number], placed for [facility] [address]. Contact information placed on AVS.         Expected Discharge Plan and Services                                               Social Determinants of Health (SDOH) Interventions SDOH Screenings   Food Insecurity: Food Insecurity Present (03/28/2024)  Housing: High Risk (03/28/2024)  Transportation Needs: Unmet Transportation Needs (03/28/2024)  Utilities: Not At Risk (03/28/2024)  Recent Concern: Utilities - At Risk (02/10/2024)  Alcohol Screen: Medium Risk (02/09/2024)  Depression (PHQ2-9): Medium Risk (12/29/2023)  Financial Resource Strain: Low Risk  (10/19/2022)  Physical Activity: Inactive (10/19/2022)  Social Connections: Unknown (03/29/2024)  Stress: No Stress Concern Present (10/19/2022)  Tobacco Use: High Risk (03/31/2024)    Readmission Risk Interventions     No data to display

## 2024-03-31 NOTE — Anesthesia Procedure Notes (Signed)
 Procedure Name: MAC Date/Time: 03/31/2024 9:02 AM  Performed by: Dyanna Glasgow, CRNAPre-anesthesia Checklist: Patient identified, Emergency Drugs available, Suction available, Patient being monitored and Timeout performed Patient Re-evaluated:Patient Re-evaluated prior to induction Oxygen Delivery Method: Nasal cannula Preoxygenation: Pre-oxygenation with 100% oxygen Induction Type: IV induction

## 2024-03-31 NOTE — Interval H&P Note (Signed)
 History and Physical Interval Note:  03/31/2024 9:02 AM  Robert Chan  has presented today for surgery, with the diagnosis of melena, anemia.  The various methods of treatment have been discussed with the patient and family. After consideration of risks, benefits and other options for treatment, the patient has consented to  Procedure(s): COLONOSCOPY (N/A) as a surgical intervention.  The patient's history has been reviewed, patient examined, no change in status, stable for surgery.  I have reviewed the patient's chart and labs.  Questions were answered to the patient's satisfaction.     Kerby Pearson III

## 2024-03-31 NOTE — Progress Notes (Signed)
 Unable to discharge pt at this time. Labs pending collection.

## 2024-03-31 NOTE — Progress Notes (Signed)
   03/31/24 1041  Vitals  Temp 97.8 F (36.6 C)  Temp Source Oral  BP (!) 154/86  MAP (mmHg) 107  BP Location Left Arm  BP Method Automatic  Patient Position (if appropriate) Lying  Pulse Rate 63  Pulse Rate Source Monitor  ECG Heart Rate 67  Resp 14  Level of Consciousness  Level of Consciousness Alert  MEWS COLOR  MEWS Score Color Green  Oxygen Therapy  SpO2 99 %  O2 Device Room Air  Pain Assessment  Pain Scale 0-10  Pain Score 0  MEWS Score  MEWS Temp 0  MEWS Systolic 0  MEWS Pulse 0  MEWS RR 0  MEWS LOC 0  MEWS Score 0   Pt return to unit from Colonoscopy. Pt A&Ox4, MAEx4, pt alert and verbally responsive.

## 2024-03-31 NOTE — Evaluation (Signed)
 Physical Therapy Evaluation & Discharge Patient Details  Name: Robert Chan MRN: [id number] DOB: 30-Mar-1956 Today's Date: 03/31/2024  History of Present Illness  Pt is a 68 y.o. male who presented 03/28/24 with dark-colored stool. EGD 5/30 was unrevealing. Colonoscopy 5/31. PMH: asthma, CAD, CKD, cocaine use disorder, COPD, coronary vasospasm, depression, homicidal ideations, HTN, major depressive disorder, NSTEMI, schizoaffective disorder, suicidal ideation   Clinical Impression  Pt presents with condition above. The pt appears and reports to be at his baseline, demonstrating independence with all functional mobility without an AD or LOB. He does report R 4th and 5th finger numbness and hand weakness with noticeable muscle atrophy that began a couple months ago. Notified MD and OT. He could benefit from OP MD follow-up and OP OT upon d/c to address these deficits/symptoms. All education completed and questions answered. PT will sign off.      If plan is discharge home, recommend the following: Assist for transportation   Can travel by private vehicle        Equipment Recommendations None recommended by PT  Recommendations for Other Services  Other (comment) (Outpatient OT)    Functional Status Assessment Patient has not had a recent decline in their functional status     Precautions / Restrictions Precautions Precautions: None Restrictions Weight Bearing Restrictions Per Provider Order: No      Mobility  Bed Mobility Overal bed mobility: Independent             General bed mobility comments: No assistance needed    Transfers Overall transfer level: Independent Equipment used: None               General transfer comment: No LOB, no assistance needed    Ambulation/Gait Ambulation/Gait assistance: Modified independent (Device/Increase time) Gait Distance (Feet): 220 Feet Assistive device: None Gait Pattern/deviations: Step-through pattern Gait velocity:  WFL     General Gait Details: Pt ambulates steadily with a step-through gait pattern with a mild limp, but denies pain, just reports it is from not being up and walking much since admission. No LOB, even with changing head positions, directions, and speeds.  Stairs            Wheelchair Mobility     Tilt Bed    Modified Rankin (Stroke Patients Only)       Balance Overall balance assessment: No apparent balance deficits (not formally assessed)                                           Pertinent Vitals/Pain Pain Assessment Pain Assessment: No/denies pain    Home Living Family/patient expects to be discharged to:: Private residence Living Arrangements: Children (daughter) Available Help at Discharge: Family;Available 24 hours/day Type of Home: Apartment Home Access: Stairs to enter Entrance Stairs-Rails: Right Entrance Stairs-Number of Steps: 2   Home Layout: Two level;Able to live on main level with bedroom/bathroom;Full bath on main level Home Equipment: None      Prior Function Prior Level of Function : Independent/Modified Independent             Mobility Comments: No AD ADLs Comments: does not drive     Extremity/Trunk Assessment   Upper Extremity Assessment Upper Extremity Assessment: Right hand dominant;RUE deficits/detail RUE Deficits / Details: report R 4th and 5th finger numbness and hand weakness with noticeable muscle atrophy that began a couple months ago; noted decreased  grip and finger to thumb strength throughout RUE Sensation: decreased light touch (at 4th and 5th digits)    Lower Extremity Assessment Lower Extremity Assessment: Overall WFL for tasks assessed (MMT scores of 5 grossly bil; denied numbness/tingling bil)    Cervical / Trunk Assessment Cervical / Trunk Assessment: Normal  Communication   Communication Communication: No apparent difficulties    Cognition Arousal: Alert Behavior During Therapy: WFL  for tasks assessed/performed   PT - Cognitive impairments: No apparent impairments                         Following commands: Intact       Cueing Cueing Techniques: Verbal cues     General Comments General comments (skin integrity, edema, etc.): pt denied lightheadedness throughout session; notified OT and MD of pt's report of R 4th and 5th finger numbness and hand weakness with noticeable muscle atrophy that began a couple months ago    Exercises     Assessment/Plan    PT Assessment Patient does not need any further PT services  PT Problem List         PT Treatment Interventions      PT Goals (Current goals can be found in the Care Plan section)  Acute Rehab PT Goals Patient Stated Goal: to go home and figure out why his R hand is numb/weak PT Goal Formulation: All assessment and education complete, DC therapy Time For Goal Achievement: 04/01/24 Potential to Achieve Goals: Good    Frequency       Co-evaluation               AM-PAC PT "6 Clicks" Mobility  Outcome Measure Help needed turning from your back to your side while in a flat bed without using bedrails?: None Help needed moving from lying on your back to sitting on the side of a flat bed without using bedrails?: None Help needed moving to and from a bed to a chair (including a wheelchair)?: None Help needed standing up from a chair using your arms (e.g., wheelchair or bedside chair)?: None Help needed to walk in hospital room?: None Help needed climbing 3-5 steps with a railing? : None 6 Click Score: 24    End of Session   Activity Tolerance: Patient tolerated treatment well Patient left: in bed;with call bell/phone within reach;with bed alarm set   PT Visit Diagnosis: Muscle weakness (generalized) (M62.81)    Time: 5784-6962 PT Time Calculation (min) (ACUTE ONLY): 19 min   Charges:   PT Evaluation $PT Eval Low Complexity: 1 Low   PT General Charges $$ ACUTE PT VISIT: 1  Visit         Vernida Goodie, PT, DPT Acute Rehabilitation Services  Office: [phone]   Ellyn Hack 03/31/2024, 1:14 PM

## 2024-03-31 NOTE — Anesthesia Postprocedure Evaluation (Signed)
 Anesthesia Post Note  Patient: Robert Chan  Procedure(s) Performed: COLONOSCOPY     Patient location during evaluation: PACU Anesthesia Type: MAC Level of consciousness: awake and alert Pain management: pain level controlled Vital Signs Assessment: post-procedure vital signs reviewed and stable Respiratory status: spontaneous breathing, nonlabored ventilation, respiratory function stable and patient connected to nasal cannula oxygen Cardiovascular status: stable and blood pressure returned to baseline Postop Assessment: no apparent nausea or vomiting Anesthetic complications: no   No notable events documented.  Last Vitals:  Vitals:   03/31/24 1000 03/31/24 1041  BP: (!) 158/89 (!) 154/86  Pulse: 60 63  Resp: 19 14  Temp: 36.8 C 36.6 C  SpO2: 99% 99%    Last Pain:  Vitals:   03/31/24 1041  TempSrc: Oral  PainSc: 0-No pain                 [name]

## 2024-03-31 NOTE — Transfer of Care (Signed)
 Immediate Anesthesia Transfer of Care Note  Patient: Robert Chan  Procedure(s) Performed: COLONOSCOPY  Patient Location: PACU  Anesthesia Type:MAC  Level of Consciousness: drowsy  Airway & Oxygen Therapy: Patient Spontanous Breathing and Patient connected to nasal cannula oxygen  Post-op Assessment: Report given to RN and Post -op Vital signs reviewed and stable  Post vital signs: Reviewed and stable  Last Vitals:  Vitals Value Taken Time  BP 141/81 03/31/24 0935  Temp    Pulse 57 03/31/24 0936  Resp 19 03/31/24 0936  SpO2 96 % 03/31/24 0936  Vitals shown include unfiled device data.  Last Pain:  Vitals:   03/31/24 0850  TempSrc: Temporal  PainSc: 0-No pain         Complications: No notable events documented.

## 2024-03-31 NOTE — Op Note (Addendum)
 [facility] Patient Name: Robert Chan Procedure Date : 03/31/2024 MRN: [id number] Attending MD: Roel Clarity. Dominic Friendly , MD, [id number] Date of Birth: July 12, 1956 CSN: [id number] Age: 68 Admit Type: Inpatient Procedure:                Colonoscopy Indications:              Heme positive stool, Melena, Acute post hemorrhagic                            anemia                           no source on EGD yesterday Providers:                Roel Clarity. Dominic Friendly, MD, Perla Bradford, RN, Gabino Joe, Technician Referring MD:             [facility] Medicines:                Monitored Anesthesia Care Complications:            No immediate complications. Estimated Blood Loss:     Estimated blood loss: none. Procedure:                Pre-Anesthesia Assessment:                           - Prior to the procedure, a History and Physical                            was performed, and patient medications and                            allergies were reviewed. The patient's tolerance of                            previous anesthesia was also reviewed. The risks                            and benefits of the procedure and the sedation                            options and risks were discussed with the patient.                            All questions were answered, and informed consent                            was obtained. Prior Anticoagulants: The patient has                            taken no anticoagulant or antiplatelet agents. ASA                            Grade Assessment: III -  A patient with severe                            systemic disease. After reviewing the risks and                            benefits, the patient was deemed in satisfactory                            condition to undergo the procedure.                           After obtaining informed consent, the colonoscope                            was passed under direct vision. Throughout  the                            procedure, the patient's blood pressure, pulse, and                            oxygen saturations were monitored continuously. The                            CF-HQ190L ([id number] Olympus colonoscope was                            introduced through the anus and advanced to the the                            terminal ileum, with identification of the                            appendiceal orifice and IC valve. The colonoscopy                            was somewhat difficult due to a redundant colon.                            Successful completion of the procedure was aided by                            using manual pressure and straightening and                            shortening the scope to obtain bowel loop                            reduction. The patient tolerated the procedure                            well. The quality of the bowel preparation was  good. The terminal ileum, ileocecal valve,                            appendiceal orifice, and rectum were photographed. Scope In: 9:09:38 AM Scope Out: 9:24:20 AM Scope Withdrawal Time: 0 hours 9 minutes 1 second  Total Procedure Duration: 0 hours 14 minutes 42 seconds  Findings:      The perianal and digital rectal examinations were normal.      The terminal ileum appeared normal.      Repeat examination of right colon under NBI performed.      Multiple diverticula were found in the left colon and right colon.      Internal hemorrhoids were found.      The exam was otherwise without abnormality on direct and retroflexion       views. Impression:               - The examined portion of the ileum was normal.                           - Diverticulosis in the left colon and in the right                            colon.                           - Internal hemorrhoids.                           - The examination was otherwise normal on direct                            and  retroflexion views.                           - No specimens collected.                           It is not clear that the reported 3 days of black                            stool was all from GI bleeding, and the patient's                            hemoglobin has fluctuated but remained in the                            11.5-12.5 range. If there was overt GI bleeding, it                            may have been a right-sided diverticular source                            that has since resolved. No source on EGD  yesterday. No fresh or old blood in the terminal                            ileum or the colon on this exam. Recommendation:           - Return patient to hospital ward for ongoing care                            and possible same-day discharge at the discretion                            of hospitalist.                           - Resume regular diet.                           Aspirin  can be resumed if it was taken prior to the                            hospitalization and if clinically indicated.                           As needed outpatient GI follow-up                           Consider screening colonoscopy in 10 years if                            overall health allows. Procedure Code(s):        --- Professional ---                           [id number], Colonoscopy, flexible; diagnostic, including                            collection of specimen(s) by brushing or washing,                            when performed (separate procedure) Diagnosis Code(s):        --- Professional ---                           K64.8, Other hemorrhoids                           R19.5, Other fecal abnormalities                           K92.1, Melena (includes Hematochezia)                           D62, Acute posthemorrhagic anemia                           K57.30, Diverticulosis of large intestine without  perforation or abscess without  bleeding CPT copyright 2022 American Medical Association. All rights reserved. The codes documented in this report are preliminary and upon coder review may  be revised to meet current compliance requirements. [name] L. Dominic Friendly, MD 03/31/2024 9:35:50 AM This report has been signed electronically. Number of Addenda: 0

## 2024-03-31 NOTE — Progress Notes (Signed)
 Per primary RN,. Okay to dc patient now. Iv dcd. Site unermarkable. All belongings and paperwork given to patient.

## 2024-03-31 NOTE — Anesthesia Preprocedure Evaluation (Addendum)
 Anesthesia Evaluation  Patient identified by MRN, date of birth, ID band Patient awake    Reviewed: Allergy & Precautions, NPO status , Patient's Chart, lab work & pertinent test results, reviewed documented beta blocker date and time   History of Anesthesia Complications Negative for: history of anesthetic complications  Airway Mallampati: III  TM Distance: >3 FB     Dental  (+) Edentulous Upper, Missing   Pulmonary asthma , COPD, Current Smoker   breath sounds clear to auscultation       Cardiovascular hypertension, + CAD, + Past MI and + Peripheral Vascular Disease   Rhythm:Regular Rate:Normal   IMPRESSIONS     1. Left ventricular ejection fraction, by estimation, is 55 to 60%. The  left ventricle has normal function. The left ventricle demonstrates  regional wall motion abnormalities (see scoring diagram/findings for  description). Left ventricular diastolic  parameters were normal. There is mild hypokinesis of the left ventricular,  mid inferoseptal wall and inferior wall.   2. Right ventricular systolic function is normal. The right ventricular  size is normal.   3. The mitral valve is normal in structure. Trivial mitral valve  regurgitation. No evidence of mitral stenosis.   4. The aortic valve is grossly normal. Aortic valve regurgitation is not  visualized. No aortic stenosis is present.   5. The inferior vena cava is normal in size with greater than 50%  respiratory variability, suggesting right atrial pressure of 3 mmHg.     Neuro/Psych  PSYCHIATRIC DISORDERS  Depression  Schizophrenia   Neuromuscular disease    GI/Hepatic Bowel prep,,,(+)     substance abuse  cocaine use  Endo/Other    Renal/GU Renal disease     Musculoskeletal   Abdominal   Peds  Hematology   Anesthesia Other Findings   Reproductive/Obstetrics                             Anesthesia  Physical Anesthesia Plan  ASA: 3  Anesthesia Plan: MAC   Post-op Pain Management:    Induction: Intravenous  PONV Risk Score and Plan: 1 and Ondansetron  and Propofol  infusion  Airway Management Planned: Natural Airway and Simple Face Mask  Additional Equipment:   Intra-op Plan:   Post-operative Plan:   Informed Consent: I have reviewed the patients History and Physical, chart, labs and discussed the procedure including the risks, benefits and alternatives for the proposed anesthesia with the patient or authorized representative who has indicated his/her understanding and acceptance.     Dental advisory given  Plan Discussed with:   Anesthesia Plan Comments:        Anesthesia Quick Evaluation

## 2024-03-31 NOTE — Progress Notes (Signed)
 Occupational Therapy Discharge Patient Details Name: Robert Chan MRN: [id number] DOB: 01-15-1956 Today's Date: 03/31/2024 Time:  -     Patient discharged from OT services secondary to no acute needs at this time. OT spoke with PT after PT evaluation. .  Please see latest therapy progress note for current level of functioning and progress toward goals.    Progress and discharge plan discussed with patient and/or caregiver: baseline back to prior setting  GO     [name] 03/31/2024, 12:33 PM

## 2024-03-31 NOTE — Discharge Summary (Signed)
 Physician Discharge Summary   Patient: Robert Chan MRN: [id number] DOB: August 04, 1956  Admit date:     03/28/2024  Discharge date: 03/31/24  Discharge Physician: Aura Leeds, DO   PCP: Pcp, No   Recommendations at discharge:   Follow-up with PCP within 1 to 2 weeks repeat CBC, CMP, mag, Phos within 1 week Follow-up with gastroenterology in outpatient setting within 1 to 2 weeks and repeat colonoscopy screening in 10 years  Discharge Diagnoses: Principal Problem:   Acute upper GI bleed Active Problems:   Schizoaffective disorder (HCC)   Epigastric pain   History of COPD   Melena   Atrophic gastritis without hemorrhage  Resolved Problems:   * No resolved hospital problems. *  Hospital Course: Patient is a 68 year old obese African-American male with a past medical history significant for but not limited to COPD, HTN, CAD, HLD, Hx of NSTEMI, Hx of Cocain Use schizoaffective disorder as well as other comorbidities who presented to [facility] with dark-colored stool.  He reported intermittently dark-colored stool over the last 3 days and states that he was having some diarrhea.  CT scan of the abdomen pelvis showed no acute intra-abdominal or pelvic abnormality.    GI was consulted for further evaluation and pursued EGD for further evaluation to rule out any upper GI bleeding. EGD done and showed normal larynx, normal esophagus, with gastric mucosal atrophy which was biopsied and normal examined duodenum.  Given that there is no source of GI bleeding on the EGD, current plan is to pursue a colonoscopy 03/31/24.  Colonoscopy done and showed no overt bleeding noted but did notice diverticulosis throughout the entire colon and internal hemorrhoids.  He was returned back to the hospital ward and tolerated his diet and worked with therapy.  He felt good and was deemed medically stable for discharge and will need to follow-up with PCP and GI in outpatient setting for screening  colonoscopy within 10 years and repeat follow-up in 2 weeks if necessary.  Assessment and Plan:  Melanotic and black stools Diarrhea : States was having melanotic and black stools but he was using Pepto-Bismol prior. He also states he was having some diarrhea 3 days prior to admission but none since. CT Abd/Pelvis w/ Contrast showed  No CT evidence for acute intra-abdominal or pelvic abnormality but did show fluid-filled nondilated small bowel likely in the setting of enteritis in the diffuse bladder wall thickening but he is decompressed.  Also notably he had some aortic atherosclerosis.  He was made n.p.o. in case of endoscopic evaluation however GI evaluated recommending for liquid diet and recommending PPI twice daily and he is now on IV pantoprazole .  They are checking an anemia panel and iron studies and checking a GI pathogen panel if he has diarrhea.  Currently his aspirin  is being held and at this time there is no indication for PRBC transfusion as H/H stable. Hgb/Hct went from 12.4/28.7 -> 11.4/35.6 -> 11.3/35.6 -> 12.1/37.5 -> 13.0/43.2 on last check. Will continue at least 2 large-bore Ivs. IV fluid hydration has now expired.   -Hemoglobin remains consistently stable at this point but GI is now pursuing a EGD to rule out gastric ulcer or other source of bleeding and this was normal. Underwent Colonoscopy and found that the entire portion of the ileum was normal and diverticulosis was noted in the left and right colon as well as internal hemorrhoids.  GI suspected that it was not clear that his 3-day history of black stools was from  GI bleeding but they felt that if his overt bleeding that may have been a right-sided diverticular source which is resolved. - Patient returned to the floor and ambulated with PT OT and was deemed stable for discharge.  Resumed his diet and he tolerated this well.  GI follow-up was not indicated but they are recommending screening colonoscopy in 10 years.  He is  medically stable  Metabolic Acidosis: Mild and Improving. CO2 is now 22, AG is 9, and Chloride Level is 108. CTM and Trend and repeat CMP in the AM  Normocytic Anemia/ABLA: Hgb/Hct went from 12.4/38.7 -> 11.4/35.6 -> 11.3/35.6 -> 12.1/37.5 -> 13.0/43.2 on last check. Checked Anemia Panel showed an iron level of 31, UIBC 267, TIBC of 298, saturation ration of 10%, ferritin of 25. CTM for S/Sx of Bleeding; FOBT +. Transfuse for Hgb < 7.0. Repeat CBC in the AM  COPD: Currently not in Exacerbation. C/w Dulera  substitution of Breo-Ellipta 1 puff IH and Albutero 2.5 mgl Nebs q4hprn Wheezing/SOB. CTM Respiratory Status carefully  Schizoaffective Disorder: Takes Sertraline  as an outpatient and currently being held due to being NPO but will resume now that GI recommend CLD.   Hypoalbuminemia: Patient's Albumin Level dropped down to 3.2. CTM and Trend and repeat CMP in the AM  Hx of CAD and NSTEMI / HLD: Holding ASA 81 mg po Daily. Resume Atorvastatin  80 mg po daily   Hypoalbuminemia: Patient's Albumin Trend: Recent Labs  Lab 03/28/24 1705 03/29/24 0558 03/30/24 0605 03/31/24 1451  ALBUMIN 3.5 3.0* 3.0* 3.2*  -Continue to Monitor and Trend and repeat CMP w/in 1 week  Overweight: Complicates overall prognosis and care. Estimated body mass index is 28.79 kg/m as calculated from the following:   Height as of this encounter: 6\' 4"  (1.93 m).   Weight as of this encounter: 107.3 kg. Weight Loss and Dietary Counseling given  Consultants: Gastroenterology  Procedures performed: EGD, colonoscopy Disposition: Home Diet recommendation:  Regular diet DISCHARGE MEDICATION: Allergies as of 03/31/2024       Reactions   Shellfish Allergy Hives, Rash        Medication List     TAKE these medications    acetaminophen  325 MG tablet Commonly known as: TYLENOL  Take 2 tablets (650 mg total) by mouth every 6 (six) hours as needed for mild pain (pain score 1-3) (or Fever >/= 101).   albuterol  108  (90 Base) MCG/ACT inhaler Commonly known as: VENTOLIN  HFA Inhale 2 puffs into the lungs every 6 (six) hours as needed for shortness of breath.   aspirin  EC 81 MG tablet Take 1 tablet (81 mg total) by mouth daily. Swallow whole.   atorvastatin  80 MG tablet Commonly known as: LIPITOR  Take 1 tablet (80 mg total) by mouth daily.   cyanocobalamin  1000 MCG tablet Take 1 tablet (1,000 mcg total) by mouth daily.   folic acid  1 MG tablet Commonly known as: FOLVITE  Take 1 tablet (1 mg total) by mouth daily.   hydrOXYzine  25 MG tablet Commonly known as: ATARAX  Take 1 tablet (25 mg total) by mouth 3 (three) times daily as needed for anxiety.   mirtazapine  15 MG tablet Commonly known as: REMERON  Take 1 tablet (15 mg total) by mouth at bedtime.   mometasone -formoterol  200-5 MCG/ACT Aero Commonly known as: DULERA  Inhale 2 puffs into the lungs 2 (two) times daily. What changed:  when to take this reasons to take this   sertraline  100 MG tablet Commonly known as: ZOLOFT  Take 2 tablets (200 mg total)  by mouth daily.   vitamin D3 25 MCG tablet Commonly known as: CHOLECALCIFEROL  Take 1 tablet (1,000 Units total) by mouth daily.        Follow-up Information     [facility] Follow up.   Specialty: Rehabilitation Why: Outpatient Occupational Therapy. Office will call to arrange follow up after hospital discharge. Contact information: [address] [place]  [phone]               Discharge Exam: Cleavon Curls Weights   03/30/24 0500 03/30/24 0855 03/31/24 0500  Weight: 120 kg 108.9 kg 107.3 kg   Vitals:   03/31/24 1207 03/31/24 1634  BP: 136/77 (!) 151/91  Pulse:  75  Resp: 18 18  Temp: (!) 97.5 F (36.4 C) 97.7 F (36.5 C)  SpO2:  99%   Examination: Physical Exam:  Constitutional: WN/WD overweight African-American male in no acute distress Respiratory: Diminished to auscultation  bilaterally, no wheezing, rales, rhonchi or crackles. Normal respiratory effort and patient is not tachypenic. No accessory muscle use.  Unlabored breathing Cardiovascular: RRR, no murmurs / rubs / gallops. S1 and S2 auscultated.  No extremity edema Abdomen: Soft, non-tender, distended secondary to body habitus. Bowel sounds positive.  GU: Deferred. Musculoskeletal: No clubbing / cyanosis of digits/nails. No joint deformity upper and lower extremities. Skin: No rashes, lesions, ulcers on limited skin evaluation. No induration; Warm and dry.  Neurologic: CN 2-12 grossly intact with no focal deficits. Romberg sign and cerebellar reflexes not assessed.  Psychiatric: Normal judgment and insight. Alert and oriented x 3. Normal mood and appropriate affect.   Condition at discharge: stable  The results of significant diagnostics from this hospitalization (including imaging, microbiology, ancillary and laboratory) are listed below for reference.   Imaging Studies: CT ABDOMEN PELVIS W CONTRAST Result Date: 03/28/2024 CLINICAL DATA:  Abdomen pain black stool for 3 days EXAM: CT ABDOMEN AND PELVIS WITH CONTRAST TECHNIQUE: Multidetector CT imaging of the abdomen and pelvis was performed using the standard protocol following bolus administration of intravenous contrast. RADIATION DOSE REDUCTION: This exam was performed according to the departmental dose-optimization program which includes automated exposure control, adjustment of the mA and/or kV according to patient size and/or use of iterative reconstruction technique. CONTRAST:  75mL OMNIPAQUE  IOHEXOL  350 MG/ML SOLN COMPARISON:  CT 06/01/2023, 09/07/2022 FINDINGS: Lower chest: Lung bases demonstrate no acute airspace disease. Hepatobiliary: Hepatic cysts. No calcified gallstone or biliary dilatation Pancreas: Unremarkable. No pancreatic ductal dilatation or surrounding inflammatory changes. Spleen: Normal in size without focal abnormality. Adrenals/Urinary  Tract: Adrenal glands are normal. Kidneys show no hydronephrosis. The bladder is diffusely thick walled but decompressed Stomach/Bowel: The stomach is nonenlarged. Vague submucosal fat density at the gastric fundus without change. Fluid-filled nondilated small bowel. No acute bowel wall thickening. Vascular/Lymphatic: Aortic atherosclerosis. No enlarged abdominal or pelvic lymph nodes. Reproductive: Prostatectomy Other: Negative for pelvic effusion or free air Musculoskeletal: No acute or suspicious osseous abnormality IMPRESSION: 1. No CT evidence for acute intra-abdominal or pelvic abnormality. 2. Fluid-filled nondilated small bowel, possible enteritis. 3. Diffuse bladder wall thickening but decompressed. 4. Aortic atherosclerosis. Aortic Atherosclerosis (ICD10-I70.0). Electronically Signed   By: Esmeralda Hedge M.D.   On: 03/28/2024 20:18   Microbiology: Results for orders placed or performed during the hospital encounter of 02/09/24  Resp panel by RT-PCR (RSV, Flu A&B, Covid) Anterior Nasal Swab     Status: None   Collection Time: 02/09/24  1:31 PM   Specimen: Anterior Nasal Swab  Result Value Ref Range Status   SARS Coronavirus 2 by RT PCR NEGATIVE NEGATIVE Final   Influenza A by PCR NEGATIVE NEGATIVE Final   Influenza B by PCR NEGATIVE NEGATIVE Final    Comment: (NOTE) The Xpert Xpress SARS-CoV-2/FLU/RSV plus assay is intended as an aid in the diagnosis of influenza from Nasopharyngeal swab specimens and should not be used as a sole basis for treatment. Nasal washings and aspirates are unacceptable for Xpert Xpress SARS-CoV-2/FLU/RSV testing.  Fact Sheet for Patients: BloggerCourse.com  Fact Sheet for Healthcare Providers: SeriousBroker.it  This test is not yet approved or cleared by the [place]  FDA and has been authorized for detection and/or diagnosis of SARS-CoV-2 by FDA under an Emergency Use Authorization (EUA). This EUA will  remain in effect (meaning this test can be used) for the duration of the COVID-19 declaration under Section 564(b)(1) of the Act, 21 U.S.C. section 360bbb-3(b)(1), unless the authorization is terminated or revoked.     Resp Syncytial Virus by PCR NEGATIVE NEGATIVE Final    Comment: (NOTE) Fact Sheet for Patients: BloggerCourse.com  Fact Sheet for Healthcare Providers: SeriousBroker.it  This test is not yet approved or cleared by the [place]  FDA and has been authorized for detection and/or diagnosis of SARS-CoV-2 by FDA under an Emergency Use Authorization (EUA). This EUA will remain in effect (meaning this test can be used) for the duration of the COVID-19 declaration under Section 564(b)(1) of the Act, 21 U.S.C. section 360bbb-3(b)(1), unless the authorization is terminated or revoked.  Performed at [facility], [address], [place], [place] [address]    Labs: CBC: Recent Labs  Lab 03/28/24 1705 03/29/24 0558 03/29/24 1008 03/30/24 0605 03/31/24 1101  WBC 6.2 7.9  --  8.6 6.6  NEUTROABS  --  4.6  --  5.3 4.0  HGB 12.4* 11.4* 11.3* 12.1* 13.0  HCT 38.7* 35.6* 35.6* 37.5* 43.2  MCV 93.5 92.0  --  92.6 98.4  PLT 273 242  --  244 198   Basic Metabolic Panel: Recent Labs  Lab 03/28/24 1705 03/29/24 0558 03/30/24 0605 03/31/24 1451  NA 136 136 138 139  K 3.7 3.6 4.1 3.9  CL 107 110 108 108  CO2 20* 21* 22 22  GLUCOSE 118* 118* 103* 109*  BUN 9 8 5* 9  CREATININE 1.10 1.14 1.09 1.21  CALCIUM  8.6* 8.0* 8.4* 8.8*  MG 1.8 1.8 1.9 1.9  PHOS  --   --  3.4 3.2   Liver Function Tests: Recent Labs  Lab 03/28/24 1705 03/29/24 0558 03/30/24 0605 03/31/24 1451  AST 28 22 18 21   ALT 21 19 17 18   ALKPHOS 72 67 52 58  BILITOT 0.9 0.7 0.8 0.6  PROT 7.3 6.3* 6.6 6.9  ALBUMIN 3.5 3.0* 3.0* 3.2*   CBG: No results for input(s): "GLUCAP" in the last 168 hours.  Discharge time spent: greater than 30  minutes.  Signed: Aura Leeds, DO [facility] 04/02/2024

## 2024-04-02 NOTE — Anesthesia Postprocedure Evaluation (Signed)
 Anesthesia Post Note  Patient: Robert Chan  Procedure(s) Performed: EGD (ESOPHAGOGASTRODUODENOSCOPY) BIOPSY, SKIN, SUBCUTANEOUS TISSUE, OR MUCOUS MEMBRANE     Patient location during evaluation: Endoscopy Anesthesia Type: MAC Level of consciousness: awake and alert Pain management: pain level controlled Vital Signs Assessment: post-procedure vital signs reviewed and stable Respiratory status: spontaneous breathing, nonlabored ventilation and respiratory function stable Cardiovascular status: stable and blood pressure returned to baseline Postop Assessment: no apparent nausea or vomiting Anesthetic complications: no   No notable events documented.               Danthony Kendrix

## 2024-04-02 NOTE — Anesthesia Preprocedure Evaluation (Signed)
 Anesthesia Evaluation  Patient identified by MRN, date of birth, ID band Patient awake    Reviewed: Allergy & Precautions, NPO status , Patient's Chart, lab work & pertinent test results, reviewed documented beta blocker date and time   History of Anesthesia Complications Negative for: history of anesthetic complications  Airway Mallampati: III  TM Distance: >3 FB     Dental  (+) Edentulous Upper, Missing   Pulmonary asthma , COPD, Current Smoker   breath sounds clear to auscultation       Cardiovascular hypertension, + CAD, + Past MI and + Peripheral Vascular Disease   Rhythm:Regular   IMPRESSIONS     1. Left ventricular ejection fraction, by estimation, is 55 to 60%. The  left ventricle has normal function. The left ventricle demonstrates  regional wall motion abnormalities (see scoring diagram/findings for  description). Left ventricular diastolic  parameters were normal. There is mild hypokinesis of the left ventricular,  mid inferoseptal wall and inferior wall.   2. Right ventricular systolic function is normal. The right ventricular  size is normal.   3. The mitral valve is normal in structure. Trivial mitral valve  regurgitation. No evidence of mitral stenosis.   4. The aortic valve is grossly normal. Aortic valve regurgitation is not  visualized. No aortic stenosis is present.   5. The inferior vena cava is normal in size with greater than 50%  respiratory variability, suggesting right atrial pressure of 3 mmHg.     Neuro/Psych  PSYCHIATRIC DISORDERS  Depression  Schizophrenia   Neuromuscular disease    GI/Hepatic ,,,(+)     substance abuse  cocaine use? GI bleed   Endo/Other    Renal/GU Renal disease     Musculoskeletal negative musculoskeletal ROS (+)    Abdominal   Peds  Hematology  (+) Blood dyscrasia, anemia   Anesthesia Other Findings   Reproductive/Obstetrics                               Anesthesia Physical Anesthesia Plan  ASA: 3  Anesthesia Plan: MAC   Post-op Pain Management:    Induction: Intravenous  PONV Risk Score and Plan: 1 and Ondansetron  and Propofol  infusion  Airway Management Planned: Natural Airway and Simple Face Mask  Additional Equipment:   Intra-op Plan:   Post-operative Plan:   Informed Consent: I have reviewed the patients History and Physical, chart, labs and discussed the procedure including the risks, benefits and alternatives for the proposed anesthesia with the patient or authorized representative who has indicated his/her understanding and acceptance.     Dental advisory given  Plan Discussed with: CRNA  Anesthesia Plan Comments:         Anesthesia Quick Evaluation

## 2024-04-03 ENCOUNTER — Encounter (HOSPITAL_COMMUNITY): Payer: Self-pay | Admitting: Gastroenterology

## 2024-04-07 ENCOUNTER — Ambulatory Visit: Payer: Self-pay | Admitting: Gastroenterology

## 2024-04-09 ENCOUNTER — Other Ambulatory Visit: Payer: Self-pay

## 2024-04-09 MED ORDER — OMEPRAZOLE 20 MG PO CPDR: 20.0000 mg | DELAYED_RELEASE_CAPSULE | Freq: Two times a day (BID) | ORAL | 0 refills | Status: DC

## 2024-04-09 MED ORDER — TETRACYCLINE HCL 500 MG PO CAPS: 500.0000 mg | ORAL_CAPSULE | Freq: Four times a day (QID) | ORAL | 0 refills | Status: AC

## 2024-04-09 MED ORDER — METRONIDAZOLE 250 MG PO TABS
250.0000 mg | ORAL_TABLET | Freq: Four times a day (QID) | ORAL | 0 refills | Status: AC
Start: 2024-04-09 — End: 2024-04-23

## 2024-04-13 ENCOUNTER — Telehealth: Payer: Self-pay | Admitting: Gastroenterology

## 2024-04-13 NOTE — Telephone Encounter (Signed)
 Called the patient. Reviewed biopsy results. Positive H Pylori. Discussed treatment. Questions invited and answered.

## 2024-04-13 NOTE — Telephone Encounter (Signed)
 Inbound call from patient's daughter requesting a call back to discuss 6/7 results follow up note. States she would like a rexplanation patient is present so he is able to understand as well. Requesting a call back to the patient at [phone] . Please advise, thank you

## 2024-04-16 ENCOUNTER — Ambulatory Visit: Attending: Nurse Practitioner | Admitting: Nurse Practitioner

## 2024-04-16 ENCOUNTER — Encounter: Payer: Self-pay | Admitting: Nurse Practitioner

## 2024-04-16 VITALS — BP 136/80 | HR 71 | Resp 20 | Ht 76.0 in | Wt 271.4 lb

## 2024-04-16 DIAGNOSIS — Z0100 Encounter for examination of eyes and vision without abnormal findings: Secondary | ICD-10-CM | POA: Diagnosis not present

## 2024-04-16 DIAGNOSIS — H409 Unspecified glaucoma: Secondary | ICD-10-CM

## 2024-04-16 DIAGNOSIS — I739 Peripheral vascular disease, unspecified: Secondary | ICD-10-CM

## 2024-04-16 DIAGNOSIS — J4489 Other specified chronic obstructive pulmonary disease: Secondary | ICD-10-CM | POA: Diagnosis not present

## 2024-04-16 DIAGNOSIS — E559 Vitamin D deficiency, unspecified: Secondary | ICD-10-CM

## 2024-04-16 DIAGNOSIS — Z09 Encounter for follow-up examination after completed treatment for conditions other than malignant neoplasm: Secondary | ICD-10-CM

## 2024-04-16 DIAGNOSIS — F419 Anxiety disorder, unspecified: Secondary | ICD-10-CM

## 2024-04-16 DIAGNOSIS — F32A Depression, unspecified: Secondary | ICD-10-CM

## 2024-04-16 MED ORDER — ALBUTEROL SULFATE HFA 108 (90 BASE) MCG/ACT IN AERS: 2.0000 | INHALATION_SPRAY | Freq: Four times a day (QID) | RESPIRATORY_TRACT | 1 refills | Status: DC | PRN

## 2024-04-16 MED ORDER — MIRTAZAPINE 15 MG PO TABS: 15.0000 mg | ORAL_TABLET | Freq: Every day | ORAL | 1 refills | Status: AC

## 2024-04-16 MED ORDER — MOMETASONE FURO-FORMOTEROL FUM 200-5 MCG/ACT IN AERO: 2.0000 | INHALATION_SPRAY | Freq: Two times a day (BID) | RESPIRATORY_TRACT | 3 refills | Status: AC

## 2024-04-16 MED ORDER — ASPIRIN 81 MG PO TBEC: 81.0000 mg | DELAYED_RELEASE_TABLET | Freq: Every day | ORAL | 3 refills | Status: AC

## 2024-04-16 MED ORDER — SERTRALINE HCL 100 MG PO TABS: 200.0000 mg | ORAL_TABLET | Freq: Every day | ORAL | 1 refills | Status: AC

## 2024-04-16 MED ORDER — HYDROXYZINE HCL 10 MG PO TABS: 10.0000 mg | ORAL_TABLET | Freq: Three times a day (TID) | ORAL | 3 refills | Status: AC | PRN

## 2024-04-16 NOTE — Progress Notes (Signed)
 Assessment & Plan:  Robert Chan was seen today for hypertension.  Diagnoses and all orders for this visit:  PAD (peripheral artery disease) (HCC) -     aspirin  EC 81 MG tablet; Take 1 tablet (81 mg total) by mouth daily. Swallow whole.  Routine eye exam -     Ambulatory referral to Ophthalmology  COPD with chronic bronchitis (HCC) -     albuterol  (VENTOLIN  HFA) 108 (90 Base) MCG/ACT inhaler; Inhale 2 puffs into the lungs every 6 (six) hours as needed for shortness of breath. -     mometasone -formoterol  (DULERA ) 200-5 MCG/ACT AERO; Inhale 2 puffs into the lungs 2 (two) times daily.  Vitamin D  deficiency -     VITAMIN D  25 Hydroxy (Vit-D Deficiency, Fractures)  Glaucoma, unspecified glaucoma type, unspecified laterality -     Ambulatory referral to Ophthalmology  Anxiety and depression -     sertraline  (ZOLOFT ) 100 MG tablet; Take 2 tablets (200 mg total) by mouth daily. -     mirtazapine  (REMERON ) 15 MG tablet; Take 1 tablet (15 mg total) by mouth at bedtime. -     hydrOXYzine  (ATARAX ) 10 MG tablet; Take 1 tablet (10 mg total) by mouth 3 (three) times daily as needed for anxiety.  Hospital discharge follow-up    Patient has been counseled on age-appropriate routine health concerns for screening and prevention. These are reviewed and up-to-date. Referrals have been placed accordingly. Immunizations are up-to-date or declined.    Subjective:   Chief Complaint  Patient presents with   Hypertension    Robert Chan 68 y.o. 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    He states he ran out of his eye medications. He has a history of glaucoma.   HFU [name] was admitted on 03-28-2024 with complaints of dark stools and diarrhea. EGD done and showed normal larynx, normal esophagus, with gastric mucosal atrophy which was biopsied and normal examined duodenum. Given that there is no source of GI bleeding on the EGD, current plan is to pursue a colonoscopy 03/31/24. Colonoscopy done and showed no overt bleeding noted but did notice diverticulosis throughout the entire colon and internal hemorrhoids. He was returned back to the hospital ward and tolerated his diet and worked with therapy. He felt good and was deemed medically stable for discharge and will need to follow-up with PCP and GI in outpatient setting for screening colonoscopy within 10 years and repeat follow-up in 2 weeks if necessary. PATHOLOGY revealed H pylori. He is currently continuing quadruple therapy.  He is not taking any of his other medications. States he does not want them to interfere with his current medications.  I did instruct him that he should be taking all of his medications.    Review of Systems  Constitutional:  Negative for fever, malaise/fatigue and weight loss.  HENT: Negative.  Negative for nosebleeds.   Eyes: Negative.  Negative for blurred vision, double vision and photophobia.  Respiratory: Negative.  Negative for cough and shortness of breath.   Cardiovascular: Negative.  Negative for chest pain, palpitations and leg swelling.  Gastrointestinal: Negative.  Negative for heartburn, nausea and vomiting.  Musculoskeletal: Negative.  Negative for myalgias.  Neurological: Negative.  Negative for dizziness, focal weakness, seizures and headaches.  Psychiatric/Behavioral: Negative.  Negative for suicidal ideas.     Past Medical History:   Diagnosis Date   Acute bilateral low back pain without sciatica 02/27/2021   Asthma    CAD (coronary artery disease)    Cataract    CKD (chronic kidney disease)    Cocaine use disorder, mild, abuse (HCC)    COPD (chronic obstructive pulmonary disease) (HCC)    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   Depression    Elevated serum creatinine 08/28/2019   Homelessness 06/19/2020   Homicidal ideations 05/25/2023   Hypertension    Major depressive disorder, recurrent episode, 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/11/2022   NSTEMI (non-ST elevated myocardial infarction) (HCC)    Schizoaffective disorder (HCC) 11/08/2022   Status post incision and drainage 04/22/2021   Suicidal ideation 06/05/2020    Past Surgical History:  Procedure Laterality Date   APPENDECTOMY     BIOPSY OF SKIN SUBCUTANEOUS TISSUE AND/OR MUCOUS MEMBRANE  03/30/2024   Procedure: BIOPSY, SKIN, SUBCUTANEOUS TISSUE, OR MUCOUS MEMBRANE;  Surgeon: Albertina Hugger, MD;  Location: [facility] ENDOSCOPY;  Service: Gastroenterology;;   COLONOSCOPY N/A 03/31/2024   Procedure: COLONOSCOPY;  Surgeon: Albertina Hugger, MD;  Location: [facility] ENDOSCOPY;  Service: Gastroenterology;  Laterality: N/A;   ESOPHAGOGASTRODUODENOSCOPY N/A 03/30/2024   Procedure: EGD (ESOPHAGOGASTRODUODENOSCOPY);  Surgeon: Albertina Hugger, MD;  Location: [facility] ENDOSCOPY;  Service: Gastroenterology;  Laterality: N/A;   EYE SURGERY     LEFT HEART CATH AND CORONARY ANGIOGRAPHY N/A 06/05/2020   Procedure: LEFT HEART CATH AND CORONARY ANGIOGRAPHY;  Surgeon: Cody Das, MD;  Location: [facility] INVASIVE CV LAB;  Service: Cardiovascular;  Laterality: N/A;   PROSTATE SURGERY      Family History  Problem Relation Age of Onset   Hypertension Mother    Diabetes Mother    Hypertension Father    Hypertension Sister     Social History Reviewed with no changes to be made today.   Outpatient Medications Prior to Visit  Medication Sig Dispense Refill   acetaminophen  (TYLENOL ) 325 MG tablet Take  2 tablets (650 mg total) by mouth every 6 (six) hours as needed for mild pain (pain score 1-3) (or Fever >/= 101). 20 tablet 0   metroNIDAZOLE  (FLAGYL ) 250 MG tablet Take 1 tablet (250 mg total) by mouth 4 (four) times daily for 14 days. 56 tablet 0   omeprazole  (PRILOSEC) 20 MG capsule Take 1 capsule (20 mg total) by mouth 2 (two) times daily before a meal for 14 days. 28 capsule 0   tetracycline  (SUMYCIN ) 500 MG capsule Take 1 capsule (500 mg total) by mouth 4 (four) times daily for 14 days. 56 capsule 0   albuterol  (VENTOLIN  HFA) 108 (90 Base) MCG/ACT inhaler Inhale 2 puffs into the lungs every 6 (six) hours as needed for shortness of breath. 18 g 1   cholecalciferol  (CHOLECALCIFEROL ) 25 MCG tablet Take 1 tablet (1,000 Units total) by mouth daily. 30 tablet 0   mometasone -formoterol  (DULERA ) 200-5 MCG/ACT AERO Inhale 2 puffs into the lungs 2 (two) times daily. 13 g 3   atorvastatin  (LIPITOR ) 80 MG tablet Take 1 tablet (80 mg total) by mouth daily. (Patient not taking: Reported on 04/16/2024) 90 tablet 3   cyanocobalamin  1000 MCG tablet Take 1 tablet (1,000 mcg total) by mouth daily. (Patient not taking: Reported on 04/16/2024)  30 tablet 0   folic acid  (FOLVITE ) 1 MG tablet Take 1 tablet (1 mg total) by mouth daily. (Patient not taking: Reported on 04/16/2024) 30 tablet 0   aspirin  EC 81 MG tablet Take 1 tablet (81 mg total) by mouth daily. Swallow whole. (Patient not taking: Reported on 04/16/2024)     hydrOXYzine  (ATARAX ) 25 MG tablet Take 1 tablet (25 mg total) by mouth 3 (three) times daily as needed for anxiety. (Patient not taking: Reported on 04/16/2024) 30 tablet 0   mirtazapine  (REMERON ) 15 MG tablet Take 1 tablet (15 mg total) by mouth at bedtime. (Patient not taking: Reported on 04/16/2024) 30 tablet 0   sertraline  (ZOLOFT ) 100 MG tablet Take 2 tablets (200 mg total) by mouth daily. (Patient not taking: Reported on 04/16/2024) 60 tablet 0   No facility-administered medications prior to visit.     Allergies  Allergen Reactions   Shellfish Allergy Hives and Rash       Objective:    BP 136/80 (BP Location: Left Arm, Patient Position: Sitting, Cuff Size: Normal)   Pulse 71   Resp 20   Ht 6' 4 (1.93 m)   Wt 271 lb 6.4 oz (123.1 kg)   SpO2 97%   BMI 33.04 kg/m  Wt Readings from Last 3 Encounters:  04/16/24 271 lb 6.4 oz (123.1 kg)  03/31/24 236 lb 8.9 oz (107.3 kg)  02/09/24 240 lb (108.9 kg)    Physical Exam Vitals and nursing note reviewed.  Constitutional:      Appearance: He is well-developed.  HENT:     Head: Normocephalic and atraumatic.   Cardiovascular:     Rate and Rhythm: Normal rate and regular rhythm.     Heart sounds: Normal heart sounds. No murmur heard.    No friction rub. No gallop.  Pulmonary:     Effort: Pulmonary effort is normal. No tachypnea or respiratory distress.     Breath sounds: Normal breath sounds. No decreased breath sounds, wheezing, rhonchi or rales.  Chest:     Chest wall: No tenderness.  Abdominal:     General: Bowel sounds are normal.     Palpations: Abdomen is soft.   Musculoskeletal:        General: Normal range of motion.     Cervical back: Normal range of motion.   Skin:    General: Skin is warm and dry.   Neurological:     Mental Status: He is alert and oriented to person, place, and time.     Coordination: Coordination normal.   Psychiatric:        Behavior: Behavior normal. Behavior is cooperative.        Thought Content: Thought content normal.        Judgment: Judgment normal.          Patient has been counseled extensively about nutrition and exercise as well as the importance of adherence with medications and regular follow-up. The patient was given clear instructions to go to ER or return to medical center if symptoms don't improve, worsen or new problems develop. The patient verbalized understanding.   Follow-up: Return in about 3 months (around 07/17/2024).   Collins Dean, FNP-BC [facility] [place], [place] [phone]   04/16/2024, 9:20 PM

## 2024-04-17 ENCOUNTER — Ambulatory Visit: Payer: Self-pay | Admitting: Internal Medicine

## 2024-04-17 LAB — VITAMIN D 25 HYDROXY (VIT D DEFICIENCY, FRACTURES): Vit D, 25-Hydroxy: 20.4 ng/mL — ABNORMAL LOW (ref 30.0–100.0)

## 2024-04-25 LAB — SURGICAL PATHOLOGY

## 2024-05-16 ENCOUNTER — Ambulatory Visit: Admitting: Gastroenterology

## 2024-06-05 ENCOUNTER — Ambulatory Visit (INDEPENDENT_AMBULATORY_CARE_PROVIDER_SITE_OTHER): Admitting: Podiatry

## 2024-06-05 ENCOUNTER — Encounter: Payer: Self-pay | Admitting: Podiatry

## 2024-06-05 DIAGNOSIS — M79674 Pain in right toe(s): Secondary | ICD-10-CM

## 2024-06-05 DIAGNOSIS — B351 Tinea unguium: Secondary | ICD-10-CM

## 2024-06-05 DIAGNOSIS — M79675 Pain in left toe(s): Secondary | ICD-10-CM

## 2024-06-05 DIAGNOSIS — L989 Disorder of the skin and subcutaneous tissue, unspecified: Secondary | ICD-10-CM

## 2024-06-05 NOTE — Progress Notes (Signed)
 This patient returns to my office for at risk foot care.  This patient requires this care by a professional since this patient will be at risk due to having PAD and CKD..    This patient is unable to cut nails himself since the patient cannot reach his nails.These nails are painful walking and wearing shoes. Patient also has painful callus on the outside on bottom of both feet. This patient presents for at risk foot care today.  General Appearance  Alert, conversant and in no acute stress.  Vascular  Dorsalis pedis and posterior tibial  pulses are palpable  bilaterally.  Capillary return is within normal limits  bilaterally. Temperature is within normal limits  bilaterally.  Neurologic  Senn-Weinstein monofilament wire test within normal limits  bilaterally. Muscle power within normal limits bilaterally.  Nails Thick disfigured discolored nails with subungual debris  from hallux to fifth toes bilaterally. No evidence of bacterial infection or drainage bilaterally.  Orthopedic  No limitations of motion  feet .  No crepitus or effusions noted.  No bony pathology or digital deformities noted. Plantar flexed fifth metatarsals  B/L.  Skin  normotropic skin with no porokeratosis noted bilaterally.  No signs of infections or ulcers noted.   Callus sub 5th met both feet.  Onychomycosis  Pain in right toes  Pain in left toes  Callus  B/L.  Consent was obtained for treatment procedures.   Mechanical debridement of nails 1-5  bilaterally performed with a nail nipper.  Filed with dremel without incident. Debride callus with # 15 blade and dremel tool.   Return office visit   4 months                  Told patient to return for periodic foot care and evaluation due to potential at risk complications.   Cordella Bold DPM

## 2024-07-13 ENCOUNTER — Other Ambulatory Visit: Payer: Self-pay

## 2024-07-13 ENCOUNTER — Emergency Department (HOSPITAL_COMMUNITY)
Admission: EM | Admit: 2024-07-13 | Discharge: 2024-07-13 | Discharge status: Against Medical Advice | Attending: Emergency Medicine | Admitting: Emergency Medicine

## 2024-07-13 ENCOUNTER — Ambulatory Visit (HOSPITAL_COMMUNITY)

## 2024-07-13 DIAGNOSIS — R1013 Epigastric pain: Secondary | ICD-10-CM | POA: Insufficient documentation

## 2024-07-13 DIAGNOSIS — R1033 Periumbilical pain: Secondary | ICD-10-CM | POA: Insufficient documentation

## 2024-07-13 DIAGNOSIS — R0602 Shortness of breath: Secondary | ICD-10-CM | POA: Insufficient documentation

## 2024-07-13 DIAGNOSIS — R079 Chest pain, unspecified: Secondary | ICD-10-CM | POA: Diagnosis not present

## 2024-07-13 DIAGNOSIS — Z5321 Procedure and treatment not carried out due to patient leaving prior to being seen by health care provider: Secondary | ICD-10-CM | POA: Insufficient documentation

## 2024-07-13 LAB — CBC WITH DIFFERENTIAL/PLATELET
Abs Immature Granulocytes: 0.03 K/uL (ref 0.00–0.07)
Basophils Absolute: 0.1 K/uL (ref 0.0–0.1)
Basophils Relative: 1 %
Eosinophils Absolute: 0.2 K/uL (ref 0.0–0.5)
Eosinophils Relative: 3 %
HCT: 41.6 % (ref 39.0–52.0)
Hemoglobin: 12.8 g/dL — ABNORMAL LOW (ref 13.0–17.0)
Immature Granulocytes: 0 %
Lymphocytes Relative: 33 %
Lymphs Abs: 2.9 K/uL (ref 0.7–4.0)
MCH: 29.8 pg (ref 26.0–34.0)
MCHC: 30.8 g/dL (ref 30.0–36.0)
MCV: 97 fL (ref 80.0–100.0)
Monocytes Absolute: 0.8 K/uL (ref 0.1–1.0)
Monocytes Relative: 9 %
Neutro Abs: 4.7 K/uL (ref 1.7–7.7)
Neutrophils Relative %: 54 %
Platelets: 234 K/uL (ref 150–400)
RBC: 4.29 MIL/uL (ref 4.22–5.81)
RDW: 13.2 % (ref 11.5–15.5)
WBC: 8.6 K/uL (ref 4.0–10.5)
nRBC: 0 % (ref 0.0–0.2)

## 2024-07-13 LAB — I-STAT CHEM 8, ED
BUN: 15 mg/dL (ref 8–23)
Calcium, Ion: 1.1 mmol/L — ABNORMAL LOW (ref 1.15–1.40)
Chloride: 108 mmol/L (ref 98–111)
Creatinine, Ser: 1.3 mg/dL — ABNORMAL HIGH (ref 0.61–1.24)
Glucose, Bld: 102 mg/dL — ABNORMAL HIGH (ref 70–99)
HCT: 40 % (ref 39.0–52.0)
Hemoglobin: 13.6 g/dL (ref 13.0–17.0)
Potassium: 4.3 mmol/L (ref 3.5–5.1)
Sodium: 139 mmol/L (ref 135–145)
TCO2: 20 mmol/L — ABNORMAL LOW (ref 22–32)

## 2024-07-13 LAB — TYPE AND SCREEN
ABO/RH(D): O POS
Antibody Screen: NEGATIVE

## 2024-07-13 LAB — COMPREHENSIVE METABOLIC PANEL WITH GFR
ALT: 16 U/L (ref 0–44)
AST: 24 U/L (ref 15–41)
Albumin: 3.4 g/dL — ABNORMAL LOW (ref 3.5–5.0)
Alkaline Phosphatase: 55 U/L (ref 38–126)
Anion gap: 10 (ref 5–15)
BUN: 14 mg/dL (ref 8–23)
CO2: 21 mmol/L — ABNORMAL LOW (ref 22–32)
Calcium: 9 mg/dL (ref 8.9–10.3)
Chloride: 108 mmol/L (ref 98–111)
Creatinine, Ser: 1.24 mg/dL (ref 0.61–1.24)
GFR, Estimated: >60 mL/min (ref >60)
Glucose, Bld: 102 mg/dL — ABNORMAL HIGH (ref 70–99)
Potassium: 4.4 mmol/L (ref 3.5–5.1)
Sodium: 139 mmol/L (ref 135–145)
Total Bilirubin: 0.5 mg/dL (ref 0.0–1.2)
Total Protein: 7 g/dL (ref 6.5–8.1)

## 2024-07-13 LAB — TROPONIN I (HIGH SENSITIVITY): Troponin I (High Sensitivity): 8 ng/L (ref <18)

## 2024-07-13 LAB — LIPASE, BLOOD: Lipase: 29 U/L (ref 11–51)

## 2024-07-13 NOTE — ED Provider Triage Note (Signed)
 Emergency Medicine Provider Triage Evaluation Note  Robert Chan , a 68 y.o. male  was evaluated in triage.  Pt complains of multiple complaints.  He reports he is been having some periumbilical epigastric pain off and on for the past 2 weeks.  Reports his bowels been having some melanotic and gray stool but no hematochezia present as well.  No urinary symptoms.  He denies any radiation of his pain.  He reports that this feeling is very similar to when he was admitted for melena previously.  He does not take any medications every day other than his daily asthma medications.  Denies any taking pantoprazole  or omeprazole .  Is not having any nausea or vomiting.  Reports he is having some chest pain occasionally on exertion but not every time.  Not having anything at rest.  Reports he does have some shortness breath but attributes this to his asthma.  No recent cough or cold symptoms.  Denies any leg swelling.  Denies any blood thinner use.  Review of Systems  Positive:  Negative:   Physical Exam  BP 138/77 (BP Location: Left Arm)   Pulse 76   Temp 98 F (36.7 C)   Resp 18   Ht 6' 4 (1.93 m)   Wt 123.1 kg   SpO2 98%   BMI 33.03 kg/m  Gen:   Awake, no distress   Resp:  Normal effort  MSK:   Moves extremities without difficulty  Other:  No lower leg edema.  Palpable pulses.  Compartments are soft.  Abdomen soft and nontender.  No tenderness of the chest.  Lungs are clear to auscultation bilaterally.  Medical Decision Making  Medically screening exam initiated at 5:02 PM.  Appropriate orders placed.  Robert Chan was informed that the remainder of the evaluation will be completed by another provider, this initial triage assessment does not replace that evaluation, and the importance of remaining in the ED until their evaluation is complete.  Labs and imaging ordered   Bernis Ernst, [place] 07/13/24 [id number]

## 2024-07-13 NOTE — ED Notes (Signed)
 Called pt 3x for vitals, and received no response.

## 2024-07-13 NOTE — ED Triage Notes (Signed)
 Pt here from home with c/o chest pain and some lower abd pain ,

## 2024-07-17 ENCOUNTER — Ambulatory Visit: Admitting: Nurse Practitioner

## 2024-07-18 ENCOUNTER — Ambulatory Visit: Admitting: Gastroenterology

## 2024-08-09 ENCOUNTER — Ambulatory Visit: Admitting: Podiatry

## 2024-08-15 ENCOUNTER — Ambulatory Visit: Admitting: Neurology

## 2024-08-17 ENCOUNTER — Ambulatory Visit: Admitting: Neurology

## 2024-08-17 ENCOUNTER — Encounter: Payer: Self-pay | Admitting: Neurology

## 2024-08-17 VITALS — BP 129/73 | HR 63 | Ht 76.0 in | Wt 285.5 lb

## 2024-08-17 DIAGNOSIS — R29898 Other symptoms and signs involving the musculoskeletal system: Secondary | ICD-10-CM

## 2024-08-17 DIAGNOSIS — R200 Anesthesia of skin: Secondary | ICD-10-CM

## 2024-08-17 DIAGNOSIS — M62541 Muscle wasting and atrophy, not elsewhere classified, right hand: Secondary | ICD-10-CM | POA: Diagnosis not present

## 2024-08-17 DIAGNOSIS — R202 Paresthesia of skin: Secondary | ICD-10-CM | POA: Diagnosis not present

## 2024-08-17 NOTE — Progress Notes (Signed)
 [facility]  PATIENT: Robert Chan DOB: 1956/01/26  REQUESTING CLINICIAN: Sherrill Alejandro Donovan, DO HISTORY FROM: Patient REASON FOR VISIT: Right hand weakness, numbness and tingling   HISTORICAL  CHIEF COMPLAINT:  Chief Complaint  Patient presents with   New Patient (Initial Visit)    Rm 13. Alone, Numbness of fingers R ring finger and pinky.  Weakness R> L.  For about a year, progressivly getting worse.     HISTORY OF PRESENT ILLNESS:  Discussed the use of AI scribe software for clinical note transcription with the patient, who gave verbal consent to proceed.  Robert Chan is a 68 year old male with history of hypertension, hyperlipidemia, CAD, COPD, schizoaffective disorder who presents with right hand weakness and numbness/tingling  He has experienced weakness and numbness in his right hand, mainly the right 4th and 5th finger for about a year. He describes his right hand as difficulty using the right hand, has to use his left hand to support the right hand. The weakness is significant enough that he predominantly uses his left hand, despite being right-handed.  No neck, shoulder, or elbow pain. The numbness is primarily localized to the right hand, described as 'just sleep to this numb'.  Denies any previous injury.  He notices the weakness in his right hand during gym activities, stating 'I always give up for this'. Despite the weakness, he confirms that his right hand remains his dominant hand, referring to it as 'the knockout hand'.    OTHER MEDICAL CONDITIONS: Hypertension, COPD, CAD, hyperlipidemia, schizoaffective disorder   REVIEW OF SYSTEMS: Full 14 system review of systems performed and negative with exception of: As noted in the HPI   ALLERGIES: Allergies  Allergen Reactions   Shellfish Allergy Hives and Rash    HOME MEDICATIONS: Outpatient Medications Prior to Visit  Medication Sig Dispense Refill   acetaminophen  (TYLENOL ) 325  MG tablet Take 2 tablets (650 mg total) by mouth every 6 (six) hours as needed for mild pain (pain score 1-3) (or Fever >/= 101). 20 tablet 0   albuterol  (VENTOLIN  HFA) 108 (90 Base) MCG/ACT inhaler Inhale 2 puffs into the lungs every 6 (six) hours as needed for shortness of breath. 18 g 1   aspirin  EC 81 MG tablet Take 1 tablet (81 mg total) by mouth daily. Swallow whole. 90 tablet 3   atorvastatin  (LIPITOR ) 80 MG tablet Take 1 tablet (80 mg total) by mouth daily. (Patient not taking: Reported on 04/16/2024) 90 tablet 3   cyanocobalamin  1000 MCG tablet Take 1 tablet (1,000 mcg total) by mouth daily. 30 tablet 0   folic acid  (FOLVITE ) 1 MG tablet Take 1 tablet (1 mg total) by mouth daily. 30 tablet 0   hydrOXYzine  (ATARAX ) 10 MG tablet Take 1 tablet (10 mg total) by mouth 3 (three) times daily as needed for anxiety. 60 tablet 3   mirtazapine  (REMERON ) 15 MG tablet Take 1 tablet (15 mg total) by mouth at bedtime. 90 tablet 1   mometasone -formoterol  (DULERA ) 200-5 MCG/ACT AERO Inhale 2 puffs into the lungs 2 (two) times daily. 13 g 3   omeprazole  (PRILOSEC) 20 MG capsule Take 1 capsule (20 mg total) by mouth 2 (two) times daily before a meal for 14 days. 28 capsule 0   sertraline  (ZOLOFT ) 100 MG tablet Take 2 tablets (200 mg total) by mouth daily. 180 tablet 1   No facility-administered medications prior to visit.    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   Depression    Elevated serum creatinine 08/28/2019   Homelessness 06/19/2020   Homicidal ideations 05/25/2023   Hypertension    Major depressive disorder, recurrent episode, 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/11/2022   NSTEMI (non-ST elevated myocardial  infarction) (HCC)    Schizoaffective disorder (HCC) 11/08/2022   Status post incision and drainage 04/22/2021   Suicidal ideation 06/05/2020    PAST SURGICAL HISTORY: Past Surgical History:  Procedure Laterality Date   APPENDECTOMY     BIOPSY OF SKIN SUBCUTANEOUS TISSUE AND/OR MUCOUS MEMBRANE  03/30/2024   Procedure: BIOPSY, SKIN, SUBCUTANEOUS TISSUE, OR MUCOUS MEMBRANE;  Surgeon: Legrand Victory LITTIE DOUGLAS, MD;  Location: [facility] ENDOSCOPY;  Service: Gastroenterology;;   COLONOSCOPY N/A 03/31/2024   Procedure: COLONOSCOPY;  Surgeon: Legrand Victory LITTIE DOUGLAS, MD;  Location: [facility] ENDOSCOPY;  Service: Gastroenterology;  Laterality: N/A;   ESOPHAGOGASTRODUODENOSCOPY N/A 03/30/2024   Procedure: EGD (ESOPHAGOGASTRODUODENOSCOPY);  Surgeon: Legrand Victory LITTIE DOUGLAS, MD;  Location: [facility] ENDOSCOPY;  Service: Gastroenterology;  Laterality: N/A;   EYE SURGERY     LEFT HEART CATH AND CORONARY ANGIOGRAPHY N/A 06/05/2020   Procedure: LEFT HEART CATH AND CORONARY ANGIOGRAPHY;  Surgeon: Elmira Newman PARAS, MD;  Location: [facility] INVASIVE CV LAB;  Service: Cardiovascular;  Laterality: N/A;   PROSTATE SURGERY      FAMILY HISTORY: Family History  Problem Relation Age of Onset   Hypertension Mother    Diabetes Mother    Hypertension Father    Hypertension Sister     SOCIAL HISTORY: Social History   Socioeconomic History   Marital status: Single    Spouse name: Not on file   Number of children: Not on file   Years of education: Not on file   Highest education level: Not on file  Occupational History   Not on file  Tobacco Use   Smoking status: Former    Current packs/day: 0.00    Types: Cigarettes    Quit date: 12/25/2020    Years since quitting: 3.6   Smokeless tobacco: Never   Tobacco comments:    Stopped 6-8 mo ago  Vaping Use   Vaping status: Never Used  Substance and Sexual Activity   Alcohol use: Not Currently    Comment: 1 40oz beer about every other day   Drug use: Not Currently    Types: Cocaine    Comment:  Cocaine about once or twice a month; last use 6 months ago   Sexual activity: Not Currently  Other Topics Concern   Not on file  Social History Narrative   His mother passed at age 46 (when he was 61 years old) and his father raised him and his siblings   His parents were pastors as a lot of his sisters and other family members are today   There was a total of 13 children in his family Had 46 sisters, 2 sister deceased , Had 3 brothers, all brothers deceased   He is the youngest of the 61 and was always in trouble in my younger years   Family still live in [place], [place], some in [place], [place]   Has a Daughter and son with a total of 10 grandchildren (6 grand daughters local  and 4 grandsons)    Values family   Married twice, present wife incarcerated   Not working.    Social Drivers of Corporate investment banker Strain: Low Risk  (10/19/2022)   Overall Financial Resource Strain (CARDIA)    Difficulty of Paying Living Expenses: Not hard at all  Food Insecurity: Food Insecurity Present (03/28/2024)   Hunger Vital Sign    Worried About Running Out of Food in the Last Year: Sometimes true    Ran Out of Food in the Last Year: Sometimes true  Transportation Needs: Unmet Transportation Needs (03/28/2024)   PRAPARE - Transportation    Lack of Transportation (Medical): Yes    Lack of Transportation (Non-Medical): Yes  Physical Activity: Inactive (10/19/2022)   Exercise Vital Sign    Days of Exercise per Week: 0 days    Minutes of Exercise per Session: 0 min  Stress: No Stress Concern Present (10/19/2022)   [name] of Occupational Health - Occupational Stress Questionnaire    Feeling of Stress : Not at all  Social Connections: Unknown (03/29/2024)   Social Connection and Isolation Panel    Frequency of Communication with Friends and Family: Twice a week    Frequency of Social Gatherings with Friends and Family: Patient declined    Attends Religious Services: Never     Database administrator or Organizations: No    Attends Banker Meetings: Never    Marital Status: Patient declined  Catering manager Violence: Not At Risk (03/28/2024)   Humiliation, Afraid, Rape, and Kick questionnaire    Fear of Current or Ex-Partner: No    Emotionally Abused: No    Physically Abused: No    Sexually Abused: No    PHYSICAL EXAM  GENERAL EXAM/CONSTITUTIONAL: Vitals:  Vitals:   08/17/24 0809  BP: 129/73  Pulse: 63  SpO2: 96%  Weight: 285 lb 8 oz (129.5 kg)  Height: 6' 4 (1.93 m)   Body mass index is 34.75 kg/m. Wt Readings from Last 3 Encounters:  08/17/24 285 lb 8 oz (129.5 kg)  07/13/24 271 lb 6.2 oz (123.1 kg)  04/16/24 271 lb 6.4 oz (123.1 kg)   Patient is in no distress; well developed, nourished and groomed; neck is supple  MUSCULOSKELETAL: Gait, strength, tone, movements noted in Neurologic exam below  NEUROLOGIC: MENTAL STATUS:      No data to display         awake, alert, oriented to person, place and time recent and remote memory intact normal attention and concentration language fluent, comprehension intact, naming intact fund of knowledge appropriate   MOTOR:  Decrease bulk of the right hand.  There is thenar and hypothenar atrophy of the right hand.  Decreased right handgrip with compared to the left 4/5.  Right wrist extension is normal 5/5, Right wrist flexion mildly decreased 4/5.  Rest of his motor exam is normal  SENSORY:  normal and symmetric to light touch, pinprick, vibration  COORDINATION:  finger-nose-finger, fine finger movements normal  REFLEXES:  deep tendon reflexes present and symmetric  GAIT/STATION:  normal   DIAGNOSTIC DATA (LABS, IMAGING, TESTING) - I reviewed patient records, labs, notes, testing and imaging myself where available.  Lab Results  Component Value Date   WBC 8.6 07/13/2024   HGB 13.6 07/13/2024   HCT 40.0 07/13/2024   MCV 97.0 07/13/2024   PLT 234 07/13/2024       Component Value Date/Time   NA 139 07/13/2024 1725   NA 140  12/29/2023 1645   K 4.3 07/13/2024 1725   CL 108 07/13/2024 1725   CO2 21 (L) 07/13/2024 1700   GLUCOSE 102 (H) 07/13/2024 1725   BUN 15 07/13/2024 1725   BUN 16 12/29/2023 1645   CREATININE 1.30 (H) 07/13/2024 1725   CALCIUM  9.0 07/13/2024 1700   PROT 7.0 07/13/2024 1700   PROT 7.9 12/29/2023 1645   ALBUMIN 3.4 (L) 07/13/2024 1700   ALBUMIN 4.1 12/29/2023 1645   AST 24 07/13/2024 1700   ALT 16 07/13/2024 1700   ALKPHOS 55 07/13/2024 1700   BILITOT 0.5 07/13/2024 1700   BILITOT 0.4 12/29/2023 1645   GFRNONAA >60 07/13/2024 1700   GFRAA >60 07/08/2020 0908   Lab Results  Component Value Date   CHOL 159 09/15/2023   HDL 61 09/15/2023   LDLCALC 87 09/15/2023   TRIG 53 09/15/2023   CHOLHDL 2.6 09/15/2023   Lab Results  Component Value Date   HGBA1C 5.1 09/15/2023   Lab Results  Component Value Date   VITAMINB12 243 02/10/2024   Lab Results  Component Value Date   TSH 1.250 02/10/2024     ASSESSMENT AND PLAN  68 y.o. year old male with medical history including hypertension, hyperlipidemia, CAD, COPD, schizoaffective disorder who is presenting with right hand numbness tingling and weakness.  Right hand tingling, muscle atrophy, and numbness Chronic right hand weakness, muscle atrophy, and numbness for approximately one year. Significant muscle atrophy and weakness in the right hand compared to the left. No associated neck, shoulder, elbow pain or wrist pain. Differential diagnosis includes possible cervical radiculopathy or localized nerve compression in the arm, at the elbow or wrist. No acute pain, but numbness is present, mainly in the 4th and 5th finger. Strength testing reveals significant weakness in the right hand compared to the left. - Order MRI of the cervical spine to evaluate for possible cervical radiculopathy. - Schedule nerve conduction study to assess for nerve compression or damage in the  right upper extremity, anticipated to be scheduled in January.    1. Atrophy of muscle of right hand   2. Right hand weakness   3. Numbness and tingling of hand     Patient Instructions  MRI cervical spine to rule out nerve root or nerve compression EMG nerve conduction study of the right upper extremity I will contact you to go over the result Continue current medications Continue follow-up with PCP Return if worse   Orders Placed This Encounter  Procedures   NCV with EMG(electromyography)    No orders of the defined types were placed in this encounter.   Return if symptoms worsen or fail to improve.    Pastor Falling, MD 08/17/2024, 8:32 AM  [facility] [address], [address] [place], [place] [address] [phone]

## 2024-08-17 NOTE — Patient Instructions (Signed)
 MRI cervical spine to rule out nerve root or nerve compression EMG nerve conduction study of the right upper extremity I will contact you to go over the result Continue current medications Continue follow-up with PCP Return if worse

## 2024-08-22 ENCOUNTER — Encounter: Payer: Self-pay | Admitting: Podiatry

## 2024-08-22 ENCOUNTER — Ambulatory Visit (INDEPENDENT_AMBULATORY_CARE_PROVIDER_SITE_OTHER): Admitting: Podiatry

## 2024-08-22 DIAGNOSIS — B351 Tinea unguium: Secondary | ICD-10-CM

## 2024-08-22 DIAGNOSIS — L989 Disorder of the skin and subcutaneous tissue, unspecified: Secondary | ICD-10-CM

## 2024-08-22 DIAGNOSIS — M79674 Pain in right toe(s): Secondary | ICD-10-CM | POA: Diagnosis not present

## 2024-08-22 DIAGNOSIS — M79675 Pain in left toe(s): Secondary | ICD-10-CM | POA: Diagnosis not present

## 2024-08-22 DIAGNOSIS — Q828 Other specified congenital malformations of skin: Secondary | ICD-10-CM

## 2024-08-22 NOTE — Progress Notes (Signed)
 This patient returns to my office for at risk foot care.  This patient requires this care by a professional since this patient will be at risk due to having PAD and CKD..    This patient is unable to cut nails himself since the patient cannot reach his nails.These nails are painful walking and wearing shoes. Patient also has painful callus on the outside on bottom of both feet. This patient presents for at risk foot care today.  General Appearance  Alert, conversant and in no acute stress.  Vascular  Dorsalis pedis and posterior tibial  pulses are palpable  bilaterally.  Capillary return is within normal limits  bilaterally. Temperature is within normal limits  bilaterally.  Neurologic  Senn-Weinstein monofilament wire test within normal limits  bilaterally. Muscle power within normal limits bilaterally.  Nails Thick disfigured discolored nails with subungual debris  from hallux to fifth toes bilaterally. No evidence of bacterial infection or drainage bilaterally.  Orthopedic  No limitations of motion  feet .  No crepitus or effusions noted.  No bony pathology or digital deformities noted. Plantar flexed fifth metatarsals  B/L.  Skin  normotropic skin with no porokeratosis noted bilaterally.  No signs of infections or ulcers noted.   Callus sub 5th met both feet.  Onychomycosis  Pain in right toes  Pain in left toes  Callus  B/L.  Consent was obtained for treatment procedures.   Mechanical debridement of nails 1-5  bilaterally performed with a nail nipper.  Filed with dremel without incident. Debride callus with dremel tool done as a courtesy.   Return office visit   3  months                  Told patient to return for periodic foot care and evaluation due to potential at risk complications.   Cordella Bold DPM

## 2024-09-05 ENCOUNTER — Other Ambulatory Visit: Payer: Self-pay | Admitting: Nurse Practitioner

## 2024-09-05 DIAGNOSIS — J4489 Other specified chronic obstructive pulmonary disease: Secondary | ICD-10-CM

## 2024-10-19 ENCOUNTER — Ambulatory Visit: Admitting: Neurology

## 2024-10-19 VITALS — BP 120/76 | HR 68 | Ht 76.0 in | Wt 288.5 lb

## 2024-10-19 DIAGNOSIS — R200 Anesthesia of skin: Secondary | ICD-10-CM | POA: Diagnosis not present

## 2024-10-19 DIAGNOSIS — M62541 Muscle wasting and atrophy, not elsewhere classified, right hand: Secondary | ICD-10-CM | POA: Diagnosis not present

## 2024-10-19 DIAGNOSIS — G5621 Lesion of ulnar nerve, right upper limb: Secondary | ICD-10-CM | POA: Diagnosis not present

## 2024-10-19 DIAGNOSIS — R29898 Other symptoms and signs involving the musculoskeletal system: Secondary | ICD-10-CM | POA: Insufficient documentation

## 2024-10-19 DIAGNOSIS — R202 Paresthesia of skin: Secondary | ICD-10-CM | POA: Diagnosis not present

## 2024-10-19 NOTE — Procedures (Signed)
 "         Full Name: Robert Chan Gender: Male MRN #: [id number] Date of Birth: [birth year]    Visit Date: 10/19/2024 08:43 Age: 68 Years Examining Physician: Onita Duos Referring Physician: Gregg Lek Height: 6 feet 4 inch History: 68 year old right-handed male presenting with slow Worsening right hand numbness involving right 4th and 5th fingers and right hand weakness  Summary of the test: Nerve conduction study: Bilateral radial, median sensory responses were within normal limit.  Left ulnar sensory response was normal.  Right ulnar sensory response was absent  Bilateral median, left ulnar motor responses were normal.  Right ulnar motor response showed significantly decreased CMAP amplitude, 24 m/s velocity drop across right elbow  Electromyography:  Selected needle examination of bilateral upper extremity muscles were performed.  There is evidence of active neuropathic changes involving right ulnar innervated muscles, such as right first dorsal interossei, abductor digiti minimi, mild involvement of right flexor carpi ulnaris.  Conclusion: This is an abnormal study.  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    Duos Onita. M.D. Ph.D.   [facility] [address] [place], [place] [address] Tel: [phone] Fax: [phone]  Verbal informed consent was obtained from the patient, patient was informed of potential risk of procedure, including bruising, bleeding, hematoma formation, infection, muscle weakness, muscle pain, numbness, among others.        MNC    Nerve / Sites Muscle Latency Ref. Amplitude Ref. Rel Amp Segments Distance Velocity Ref. Area    ms ms mV mV %  cm m/s m/s mVms  R Median - APB     Wrist APB 4.3 <=4.4 6.4 >=4.0 100 Wrist - APB 7   20.0     Upper arm APB 9.0  6.7  104 Upper arm - Wrist 25 53 >=49 22.3  L Median - APB     Wrist APB 4.2 <=4.4  6.2 >=4.0 100 Wrist - APB 7   23.0     Upper arm APB 8.9  6.0  97.1 Upper arm - Wrist 27 57 >=49 23.2  R Ulnar - ADM     Wrist ADM 4.2 <=3.3 0.8 >=6.0 100 Wrist - ADM 7   3.4     B.Elbow ADM 8.0  0.9  107 B.Elbow - Wrist 20 53 >=49 1.9     A.Elbow ADM 11.7  1.0  111 A.Elbow - B.Elbow 11 29 >=49 2.3  L Ulnar - ADM     Wrist ADM 2.5 <=3.3 13.7 >=6.0 100 Wrist - ADM 7   32.8     B.Elbow ADM 5.7  10.4  75.9 B.Elbow - Wrist 17 54 >=49 25.4     A.Elbow ADM 8.2  10.9  105 A.Elbow - B.Elbow 14 55 >=49 30.7             SNC    Nerve / Sites Rec. Site Peak Lat Ref.  Amp Ref. Segments Distance    ms ms V V  cm  R Radial - Anatomical snuff box (Forearm)     Forearm Wrist 2.7 <=2.9 18 >=15 Forearm - Wrist 10  L Radial - Anatomical snuff box (Forearm)     Forearm Wrist 2.2 <=2.9 17 >=15 Forearm - Wrist 10  R Median - Orthodromic (Dig II, Mid palm)     Dig II Wrist 3.7 <=3.4 11 >=10 Dig II - Wrist 13  L Median - Orthodromic (Dig II,  Mid palm)     Dig II Wrist 3.6 <=3.4 12 >=10 Dig II - Wrist 14  R Ulnar - Orthodromic, (Dig V, Mid palm)     Dig V Wrist NR <=3.1 NR >=5 Dig V - Wrist 11  L Ulnar - Orthodromic, (Dig V, Mid palm)     Dig V Wrist 3.0 <=3.1 7 >=5 Dig V - Wrist 12                 F  Wave    Nerve F Lat Ref.   ms ms  R Median - APB 30.5 <=31.0  R Ulnar - ADM 36.0 <=32.0  L Ulnar - ADM 31.5 <=32.0           EMG Summary Table    Spontaneous MUAP Recruitment  Muscle IA Fib PSW Fasc Other Amp Dur. Poly Pattern  R. First dorsal interosseous Increased None None None _______ Increased Increased 1+ Reduced  R. Abductor digiti minimi (manus) Increased None None None _______ Normal Normal Normal Reduced  R. Abductor pollicis brevis Normal None None None _______ Normal Normal Normal Normal  R. Pronator teres Normal None None None _______ Normal Normal Normal Normal  R. Biceps brachii Normal None None None _______ Normal Normal Normal Normal  R. Deltoid Normal None None None _______ Normal  Normal Normal Normal  R. Triceps brachii Normal None None None _______ Normal Normal Normal Normal  R. Extensor digitorum communis Normal None None None _______ Normal Normal Normal Normal  L. First dorsal interosseous Normal None None None _______ Normal Normal Normal Normal  L. Pronator teres Normal None None None _______ Normal Normal Normal Normal  L. Biceps brachii Normal None None None _______ Normal Normal Normal Normal  L. Deltoid Normal None None None _______ Normal Normal Normal Normal  L. Triceps brachii Normal None None None _______ Normal Normal Normal Normal  R. Flexor carpi ulnaris Normal None None None _______ Normal Normal Normal Reduced     "

## 2024-10-19 NOTE — Progress Notes (Signed)
 "  Chief Complaint  Patient presents with   EMG     Pt in EMG RM 4 alone     ASSESSMENT AND PLAN  Robert Chan is a 68 y.o. 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   MEDICAL HISTORY:  Robert Chan is a 68 year old right handed male, seen in request by Dr. Gregg for evaluation of right hand weakness  History is obtained from the patient and review of electronic medical records. I personally reviewed pertinent available imaging films in PACS.   PMHx of  Coronary artery disease COPD Hypertension Depression Schizoaffective disorder  He has been on disability for many years, used to be a corporate investment banker, psychologist, occupational, lives alone at home, over the past few years, he noticed gradual onset right fourth finger numbness, gradual worsening right hand weakness, but he has difficulty to give me a timeline, denies significant neck pain or elbow pain, no left hand involvement,  His right hand weakness is gradually getting worse, he has difficulty opening the door, turn on the key,  EMG nerve conduction study October 19, 2024 confirmed severe right ulnar neuropathy across right elbow, with evidence of axonal loss, surgical candidate  PHYSICAL EXAM:   Vitals:   10/19/24 0814  BP: 120/76  Pulse: 68  Weight: 288 lb 8 oz (130.9 kg)  Height: 6' 4 (1.93 m)   Body mass index is 35.12 kg/m.  PHYSICAL EXAMNIATION:  Gen: NAD, conversant, well nourised, well groomed                     Cardiovascular: Regular rate rhythm, no peripheral edema, warm, nontender. Eyes: Conjunctivae clear without exudates  or hemorrhage Neck: Supple, no carotid bruits. Pulmonary: Clear to auscultation bilaterally   NEUROLOGICAL EXAM:  MENTAL STATUS: Speech/cognition: Awake, alert, oriented to history taking and casual conversation CRANIAL NERVES: CN II: Visual fields are full to confrontation. Pupils are round equal and briskly reactive to light. CN III, IV, VI: extraocular movement are normal. No ptosis. CN V: Facial sensation is intact to light touch CN VII: Face is symmetric with normal eye closure  CN VIII: Hearing is normal to causal conversation. CN IX, X: Phonation is normal. CN XI: Head turning and shoulder shrug are intact  MOTOR: Significant atrophy of right intrinsic hand muscles, moderate weakness of right finger abduction, right forced fifth finger flexion, slight weakness of right wrist ulnar flexion  Rest of the muscle group testing was normal  Right ulnar nerve palpable thickened, rolling in and out of right cubital tunnel, no significant pain  REFLEXES: Reflexes are 1 and symmetric at the biceps, triceps, knees, and ankles. Plantar responses are flexor.  SENSORY: Decreased light touch pinprick at the right fourth fifth fingers  COORDINATION: There is no trunk or limb dysmetria noted.  GAIT/STANCE: Posture is normal. Gait is steady    REVIEW OF SYSTEMS:  Full 14 system review of systems performed and notable only for as above All other review of systems were negative.   ALLERGIES: Allergies[1]  HOME MEDICATIONS: Current Outpatient Medications  Medication Sig Dispense Refill   albuterol  (VENTOLIN  HFA) 108 (90 Base)  MCG/ACT inhaler INHALE 2 PUFFS INTO THE LUNGS EVERY 6 HOURS AS NEEDED FOR SHORTNESS OF BREATH (Patient taking differently: as directed.) 18 each 0   aspirin  EC 81 MG tablet Take 1 tablet (81 mg total) by mouth daily. Swallow whole. 90 tablet 3   mometasone -formoterol  (DULERA ) 200-5 MCG/ACT AERO Inhale 2 puffs into the lungs 2 (two) times daily. 13 g 3    acetaminophen  (TYLENOL ) 325 MG tablet Take 2 tablets (650 mg total) by mouth every 6 (six) hours as needed for mild pain (pain score 1-3) (or Fever >/= 101). 20 tablet 0   atorvastatin  (LIPITOR ) 80 MG tablet Take 1 tablet (80 mg total) by mouth daily. 90 tablet 3   cyanocobalamin  1000 MCG tablet Take 1 tablet (1,000 mcg total) by mouth daily. (Patient not taking: Reported on 10/19/2024) 30 tablet 0   folic acid  (FOLVITE ) 1 MG tablet Take 1 tablet (1 mg total) by mouth daily. (Patient not taking: Reported on 10/19/2024) 30 tablet 0   hydrOXYzine  (ATARAX ) 10 MG tablet Take 1 tablet (10 mg total) by mouth 3 (three) times daily as needed for anxiety. (Patient not taking: Reported on 10/19/2024) 60 tablet 3   mirtazapine  (REMERON ) 15 MG tablet Take 1 tablet (15 mg total) by mouth at bedtime. (Patient not taking: Reported on 10/19/2024) 90 tablet 1   omeprazole  (PRILOSEC) 20 MG capsule Take 1 capsule (20 mg total) by mouth 2 (two) times daily before a meal for 14 days. (Patient not taking: Reported on 10/19/2024) 28 capsule 0   sertraline  (ZOLOFT ) 100 MG tablet Take 2 tablets (200 mg total) by mouth daily. (Patient not taking: Reported on 10/19/2024) 180 tablet 1   No current facility-administered medications for this visit.    PAST MEDICAL HISTORY: Past Medical History:  Diagnosis Date   Acute bilateral low back pain without sciatica 02/27/2021   Asthma    CAD (coronary artery disease)    Cataract    CKD (chronic kidney disease)    Cocaine use disorder, mild, abuse (HCC)    COPD (chronic obstructive pulmonary disease) (HCC)    Coronary vasospasm 10/07/2020   Depression    Elevated serum creatinine 08/28/2019   Homelessness 06/19/2020   Homicidal ideations 05/25/2023   Hypertension    Major depressive disorder, recurrent episode, 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/11/2022   NSTEMI (non-ST elevated myocardial infarction) (HCC)    Schizoaffective disorder (HCC) 11/08/2022   Status post incision and drainage  04/22/2021   Suicidal ideation 06/05/2020    PAST SURGICAL HISTORY: Past Surgical History:  Procedure Laterality Date   APPENDECTOMY     BIOPSY OF SKIN SUBCUTANEOUS TISSUE AND/OR MUCOUS MEMBRANE  03/30/2024   Procedure: BIOPSY, SKIN, SUBCUTANEOUS TISSUE, OR MUCOUS MEMBRANE;  Surgeon: Legrand Victory LITTIE DOUGLAS, MD;  Location: [facility] ENDOSCOPY;  Service: Gastroenterology;;   COLONOSCOPY N/A 03/31/2024   Procedure: COLONOSCOPY;  Surgeon: Legrand Victory LITTIE DOUGLAS, MD;  Location: [facility] ENDOSCOPY;  Service: Gastroenterology;  Laterality: N/A;   ESOPHAGOGASTRODUODENOSCOPY N/A 03/30/2024   Procedure: EGD (ESOPHAGOGASTRODUODENOSCOPY);  Surgeon: Legrand Victory LITTIE DOUGLAS, MD;  Location: [facility] ENDOSCOPY;  Service: Gastroenterology;  Laterality: N/A;   EYE SURGERY     LEFT HEART CATH AND CORONARY ANGIOGRAPHY N/A 06/05/2020   Procedure: LEFT HEART CATH AND CORONARY ANGIOGRAPHY;  Surgeon: Elmira Newman PARAS, MD;  Location: [facility] INVASIVE CV LAB;  Service: Cardiovascular;  Laterality: N/A;   PROSTATE SURGERY      FAMILY HISTORY: Family History  Problem Relation Age of Onset   Hypertension Mother  Diabetes Mother    Hypertension Father    Hypertension Sister     SOCIAL HISTORY: Social History   Socioeconomic History   Marital status: Single    Spouse name: Not on file   Number of children: Not on file   Years of education: Not on file   Highest education level: Not on file  Occupational History   Not on file  Tobacco Use   Smoking status: Former    Current packs/day: 0.00    Types: Cigarettes    Quit date: 12/25/2020    Years since quitting: 3.8   Smokeless tobacco: Never   Tobacco comments:    Stopped 6-8 mo ago  Vaping Use   Vaping status: Never Used  Substance and Sexual Activity   Alcohol use: Not Currently    Comment: 1 40oz beer about every other day   Drug use: Not Currently    Types: Cocaine    Comment: Cocaine about once or twice a month; last use 6 months ago   Sexual activity: Not Currently  Other  Topics Concern   Not on file  Social History Narrative   His mother passed at age 18 (when he was 53 years old) and his father raised him and his siblings   His parents were pastors as a lot of his sisters and other family members are today   There was a total of 13 children in his family Had 89 sisters, 2 sister deceased , Had 3 brothers, all brothers deceased   He is the youngest of the 29 and was always in trouble in my younger years   Family still live in [place], [place], some in [place], [place]   Has a Daughter and son with a total of 10 grandchildren (6 grand daughters local and 4 grandsons)    Values family   Married twice, present wife incarcerated   Not working.    Social Drivers of Health   Tobacco Use: Medium Risk (08/22/2024)   Patient History    Smoking Tobacco Use: Former    Smokeless Tobacco Use: Never    Passive Exposure: Not on file  Financial Resource Strain: Low Risk (10/19/2022)   Overall Financial Resource Strain (CARDIA)    Difficulty of Paying Living Expenses: Not hard at all  Food Insecurity: Food Insecurity Present (03/28/2024)   Hunger Vital Sign    Worried About Running Out of Food in the Last Year: Sometimes true    Ran Out of Food in the Last Year: Sometimes true  Transportation Needs: Unmet Transportation Needs (03/28/2024)   PRAPARE - Transportation    Lack of Transportation (Medical): Yes    Lack of Transportation (Non-Medical): Yes  Physical Activity: Inactive (10/19/2022)   Exercise Vital Sign    Days of Exercise per Week: 0 days    Minutes of Exercise per Session: 0 min  Stress: No Stress Concern Present (10/19/2022)   [name] of Occupational Health - Occupational Stress Questionnaire    Feeling of Stress : Not at all  Social Connections: Unknown (03/29/2024)   Social Connection and Isolation Panel    Frequency of Communication with Friends and Family: Twice a week    Frequency of Social Gatherings with Friends and Family: Patient  declined    Attends Religious Services: Never    Database Administrator or Organizations: No    Attends Banker Meetings: Never    Marital Status: Patient declined  Intimate Partner Violence: Not At Risk (03/28/2024)  Humiliation, Afraid, Rape, and Kick questionnaire    Fear of Current or Ex-Partner: No    Emotionally Abused: No    Physically Abused: No    Sexually Abused: No  Depression (PHQ2-9): High Risk (04/16/2024)   Depression (PHQ2-9)    PHQ-2 Score: 13  Alcohol Screen: Medium Risk (02/09/2024)   Alcohol Screen    Last Alcohol Screening Score (AUDIT): 8  Housing: High Risk (03/28/2024)   Housing Stability Vital Sign    Unable to Pay for Housing in the Last Year: Yes    Number of Times Moved in the Last Year: 0    Homeless in the Last Year: Yes  Utilities: Not At Risk (03/28/2024)   [facility] Utilities    Threatened with loss of utilities: No  Recent Concern: Utilities - At Risk (02/10/2024)   [facility] Utilities    Threatened with loss of utilities: Yes  Health Literacy: Not on file      Modena Callander, M.D. Ph.D.  [facility] [address], [address] [place], [place] [address] Ph: [phone] Fax: [phone]  CC:  No referring provider defined for this encounter.  Pcp, No       [1]  Allergies Allergen Reactions   Shellfish Allergy Hives and Rash   "

## 2024-10-22 ENCOUNTER — Other Ambulatory Visit: Payer: Self-pay

## 2024-10-22 ENCOUNTER — Telehealth: Payer: Self-pay | Admitting: Neurology

## 2024-10-22 ENCOUNTER — Emergency Department (HOSPITAL_COMMUNITY)
Admission: EM | Admit: 2024-10-22 | Discharge: 2024-10-22 | Disposition: A | Discharge status: Home / Self Care | Attending: Emergency Medicine | Admitting: Emergency Medicine

## 2024-10-22 ENCOUNTER — Encounter (HOSPITAL_COMMUNITY): Payer: Self-pay

## 2024-10-22 ENCOUNTER — Emergency Department (HOSPITAL_COMMUNITY)

## 2024-10-22 DIAGNOSIS — N189 Chronic kidney disease, unspecified: Secondary | ICD-10-CM | POA: Diagnosis not present

## 2024-10-22 DIAGNOSIS — Z5900 Homelessness unspecified: Secondary | ICD-10-CM | POA: Insufficient documentation

## 2024-10-22 DIAGNOSIS — J449 Chronic obstructive pulmonary disease, unspecified: Secondary | ICD-10-CM | POA: Diagnosis not present

## 2024-10-22 DIAGNOSIS — R1033 Periumbilical pain: Secondary | ICD-10-CM

## 2024-10-22 DIAGNOSIS — K921 Melena: Secondary | ICD-10-CM | POA: Diagnosis not present

## 2024-10-22 DIAGNOSIS — Z7982 Long term (current) use of aspirin: Secondary | ICD-10-CM | POA: Insufficient documentation

## 2024-10-22 LAB — POC OCCULT BLOOD, ED: Fecal Occult Bld: NEGATIVE

## 2024-10-22 LAB — URINALYSIS, ROUTINE W REFLEX MICROSCOPIC
Bilirubin Urine: NEGATIVE
Glucose, UA: NEGATIVE mg/dL
Hgb urine dipstick: NEGATIVE
Ketones, ur: NEGATIVE mg/dL
Leukocytes,Ua: NEGATIVE
Nitrite: NEGATIVE
Protein, ur: NEGATIVE mg/dL
Specific Gravity, Urine: 1.03 (ref 1.005–1.030)
pH: 5 (ref 5.0–8.0)

## 2024-10-22 LAB — COMPREHENSIVE METABOLIC PANEL WITH GFR
ALT: 15 U/L (ref 0–44)
AST: 23 U/L (ref 15–41)
Albumin: 3.9 g/dL (ref 3.5–5.0)
Alkaline Phosphatase: 75 U/L (ref 38–126)
Anion gap: 9 (ref 5–15)
BUN: 17 mg/dL (ref 8–23)
CO2: 22 mmol/L (ref 22–32)
Calcium: 8.9 mg/dL (ref 8.9–10.3)
Chloride: 105 mmol/L (ref 98–111)
Creatinine, Ser: 1.14 mg/dL (ref 0.61–1.24)
GFR, Estimated: >60 mL/min
Glucose, Bld: 101 mg/dL — ABNORMAL HIGH (ref 70–99)
Potassium: 4.2 mmol/L (ref 3.5–5.1)
Sodium: 136 mmol/L (ref 135–145)
Total Bilirubin: 0.6 mg/dL (ref 0.0–1.2)
Total Protein: 7.6 g/dL (ref 6.5–8.1)

## 2024-10-22 LAB — CBC
HCT: 42.3 % (ref 39.0–52.0)
Hemoglobin: 13.7 g/dL (ref 13.0–17.0)
MCH: 30 pg (ref 26.0–34.0)
MCHC: 32.4 g/dL (ref 30.0–36.0)
MCV: 92.6 fL (ref 80.0–100.0)
Platelets: 240 K/uL (ref 150–400)
RBC: 4.57 MIL/uL (ref 4.22–5.81)
RDW: 12.2 % (ref 11.5–15.5)
WBC: 7.3 K/uL (ref 4.0–10.5)
nRBC: 0 % (ref 0.0–0.2)

## 2024-10-22 LAB — LIPASE, BLOOD: Lipase: 25 U/L (ref 11–51)

## 2024-10-22 LAB — TYPE AND SCREEN
ABO/RH(D): O POS
Antibody Screen: NEGATIVE

## 2024-10-22 MED ORDER — IOHEXOL 350 MG/ML SOLN
75.0000 mL | Freq: Once | INTRAVENOUS | Status: AC | PRN
  Administered 2024-10-22: 75 mL via INTRAVENOUS

## 2024-10-22 MED ORDER — OMEPRAZOLE 20 MG PO CPDR: 20.0000 mg | DELAYED_RELEASE_CAPSULE | Freq: Two times a day (BID) | ORAL | 0 refills | Status: AC

## 2024-10-22 NOTE — ED Provider Triage Note (Signed)
 Emergency Medicine Provider Triage Evaluation Note  Robert Chan , a 68 y.o. male  was evaluated in triage.  Pt complains of abdominal pain which has been ongoing for some time, reporting periumbilical pain cramping sensation exacerbated with any type of oral intake.  States that he gets full really easily, eats breakfast and does not eat anything else.  Some nausea but no vomiting.  Last bowel movement this morning.  Review of Systems  Positive: Abdominal pain, nausea Negative: Vomiting, fever, diarrhea  Physical Exam  BP 121/79   Pulse 72   Temp 97.7 F (36.5 C) (Oral)   Resp 18   Ht 6' 4 (1.93 m)   Wt 130.9 kg   SpO2 97%   BMI 35.13 kg/m  Gen:   Awake, no distress   Resp:  Normal effort  MSK:   Moves extremities without difficulty  Other:  Abdomen is not as soft with diminished bowel sounds  Medical Decision Making  Medically screening exam initiated at 12:08 PM.  Appropriate orders placed.  Berlie Hatchel was informed that the remainder of the evaluation will be completed by another provider, this initial triage assessment does not replace that evaluation, and the importance of remaining in the ED until their evaluation is complete.  Concern for malignancy    Maureen Broad, PA-C 10/22/24 1216

## 2024-10-22 NOTE — Discharge Instructions (Signed)
 You are seen today for periumbilical pain and black stools.  Your lab work and imaging today were very reassuring that low suspicion for any emergent cause recent today.  Suspecting the black stools is secondary to the Pepto-Bismol that you have been taking frequently.  With no blood noted in your stool today.  Would recommend continue to follow-up with your PCP, I am refilling your GERD medication to help retract your stomach lining.  Recommend you continue to decrease alcohol intake as this can also wear away at your stomach lining causing increased risk for ulceration.  Return to the ER for any new or worsening symptoms.

## 2024-10-22 NOTE — Telephone Encounter (Signed)
Referral for hand surgery fax to [facility]. Phone: [phone], Fax: [phone].

## 2024-10-22 NOTE — ED Provider Notes (Signed)
 " [place] EMERGENCY DEPARTMENT AT [facility] Provider Note   CSN: [id number] Arrival date & time: 10/22/24  1043     Patient presents with: Abdominal Pain   Robert Chan is a 68 y.o. male.   Abdominal Pain Patient is a 68 year old male presenting ED today for concerns for periumbilical abdominal discomfort and black stools which he reports have been present x 2 weeks.  Known to have history of gastric ulcers, Alcohol use disorder, COPD, PAD, schizoaffective disorder, cocaine use, gastric ulcers, CKD, NSTEMI, homelessness.  Reports that he had increasing periumbilical pain and has been taking Pepto-Bismol, noting black stools have been present with this.  He is not taking any GERD medication otherwise.  Notes that he has had alcohol 3 days ago, noted to be a 40 ounce beer which he normally has 3-4 times a week.  Denies any cocaine use.  Endorses nausea  Denies fever, headache, vision changes, chest pain, shortness of breath, vomiting, hematochezia, dysuria, hematuria, lower extremity swelling, rashes.    Prior to Admission medications  Medication Sig Start Date End Date Taking? Authorizing Provider  acetaminophen  (TYLENOL ) 325 MG tablet Take 2 tablets (650 mg total) by mouth every 6 (six) hours as needed for mild pain (pain score 1-3) (or Fever >/= 101). 03/31/24   Sherrill Cable Latif, DO  albuterol  (VENTOLIN  HFA) 108 (90 Base) MCG/ACT inhaler INHALE 2 PUFFS INTO THE LUNGS EVERY 6 HOURS AS NEEDED FOR SHORTNESS OF BREATH Patient taking differently: as directed. 09/05/24   Newlin, Enobong, MD  aspirin  EC 81 MG tablet Take 1 tablet (81 mg total) by mouth daily. Swallow whole. 04/16/24   Fleming, Zelda W, NP  atorvastatin  (LIPITOR ) 80 MG tablet Take 1 tablet (80 mg total) by mouth daily. 12/21/23 08/17/24  Shlomo Wilbert SAUNDERS, MD  cyanocobalamin  1000 MCG tablet Take 1 tablet (1,000 mcg total) by mouth daily. Patient not taking: Reported on 10/19/2024 02/24/24   Tingling,  Corean, PA-C  folic acid  (FOLVITE ) 1 MG tablet Take 1 tablet (1 mg total) by mouth daily. Patient not taking: Reported on 10/19/2024 02/24/24   Tingling, Corean, PA-C  hydrOXYzine  (ATARAX ) 10 MG tablet Take 1 tablet (10 mg total) by mouth 3 (three) times daily as needed for anxiety. Patient not taking: Reported on 10/19/2024 04/16/24   Fleming, Zelda W, NP  mirtazapine  (REMERON ) 15 MG tablet Take 1 tablet (15 mg total) by mouth at bedtime. Patient not taking: Reported on 10/19/2024 04/16/24   Fleming, Zelda W, NP  mometasone -formoterol  (DULERA ) 200-5 MCG/ACT AERO Inhale 2 puffs into the lungs 2 (two) times daily. 04/16/24   Fleming, Zelda W, NP  omeprazole  (PRILOSEC) 20 MG capsule Take 1 capsule (20 mg total) by mouth 2 (two) times daily before a meal. 10/22/24 11/05/24  Beola Terrall RAMAN, PA-C  sertraline  (ZOLOFT ) 100 MG tablet Take 2 tablets (200 mg total) by mouth daily. Patient not taking: Reported on 10/19/2024 04/16/24   Theotis Haze ORN, NP    Allergies: Shellfish allergy    Review of Systems  Gastrointestinal:  Positive for abdominal pain.  All other systems reviewed and are negative.   Updated Vital Signs BP 126/76 (BP Location: Left Arm)   Pulse 62   Temp 98.3 F (36.8 C) (Oral)   Resp 18   Ht 6' 4 (1.93 m)   Wt 130.9 kg   SpO2 100%   BMI 35.13 kg/m   Physical Exam Vitals and nursing note reviewed.  Constitutional:      General: He  is not in acute distress.    Appearance: Normal appearance. He is not ill-appearing or diaphoretic.  HENT:     Head: Normocephalic and atraumatic.  Eyes:     General: No scleral icterus.       Right eye: No discharge.        Left eye: No discharge.     Extraocular Movements: Extraocular movements intact.     Conjunctiva/sclera: Conjunctivae normal.  Cardiovascular:     Rate and Rhythm: Normal rate and regular rhythm.     Pulses: Normal pulses.     Heart sounds: Normal heart sounds. No murmur heard.    No friction rub. No gallop.   Pulmonary:     Effort: Pulmonary effort is normal. No respiratory distress.     Breath sounds: No stridor. No wheezing, rhonchi or rales.  Chest:     Chest wall: No tenderness.  Abdominal:     General: Abdomen is flat. There is no distension.     Palpations: Abdomen is soft.     Tenderness: There is abdominal tenderness in the periumbilical area. There is no right CVA tenderness, left CVA tenderness, guarding or rebound. Negative signs include Murphy's sign and McBurney's sign.  Musculoskeletal:        General: No swelling, deformity or signs of injury.     Cervical back: Normal range of motion. No rigidity.     Right lower leg: No edema.     Left lower leg: No edema.  Skin:    General: Skin is warm and dry.     Findings: No bruising, erythema or lesion.  Neurological:     General: No focal deficit present.     Mental Status: He is alert and oriented to person, place, and time. Mental status is at baseline.     Sensory: No sensory deficit.     Motor: No weakness.  Psychiatric:        Mood and Affect: Mood normal.     (all labs ordered are listed, but only abnormal results are displayed) Labs Reviewed  COMPREHENSIVE METABOLIC PANEL WITH GFR - Abnormal; Notable for the following components:      Result Value   Glucose, Bld 101 (*)    All other components within normal limits  URINALYSIS, ROUTINE W REFLEX MICROSCOPIC - Abnormal; Notable for the following components:   Color, Urine AMBER (*)    APPearance HAZY (*)    All other components within normal limits  LIPASE, BLOOD  CBC  POC OCCULT BLOOD, ED  TYPE AND SCREEN    EKG: None  Radiology: CT ABDOMEN PELVIS W CONTRAST Result Date: 10/22/2024 EXAM: CT ABDOMEN AND PELVIS WITH CONTRAST 03/28/2024 TECHNIQUE: CT of the abdomen and pelvis was performed with the administration of 75 mL iohexol  (OMNIPAQUE ) 350 MG/ML injection. Multiplanar reformatted images are provided for review. Automated exposure control, iterative  reconstruction, and/or weight-based adjustment of the mA/kV was utilized to reduce the radiation dose to as low as reasonably achievable. COMPARISON: None available. CLINICAL HISTORY: Abdominal pain, acute, nonlocalized. FINDINGS: LOWER CHEST: Posterior bibasilar dependent atelectasis. LIVER: Unchanged small cysts in the posterior right hepatic lobe and the left hepatic dome. GALLBLADDER AND BILE DUCTS: Gallbladder is unremarkable. No biliary ductal dilatation. SPLEEN: No acute abnormality. PANCREAS: No acute abnormality. ADRENAL GLANDS: No acute abnormality. KIDNEYS, URETERS AND BLADDER: Similar appearance of a small right interpolar region cyst. No stones in the kidneys or ureters. No hydronephrosis. No perinephric or periureteral stranding. The urinary bladder is nearly completely  decompressed. Per consensus, no follow-up is needed for simple Bosniak type 1 and 2 renal cysts, unless the patient has a malignancy history or risk factors. GI AND BOWEL: Stomach demonstrates no acute abnormality. Scattered total colonic diverticulosis. The appendix was not visualized. No right lower quadrant or pericecal inflammatory changes present to suggest acute appendicitis. There is no bowel obstruction. PERITONEUM AND RETROPERITONEUM: No ascites. No free air. VASCULATURE: Diffuse calcified aortoiliac atherosclerosis. LYMPH NODES: No lymphadenopathy. REPRODUCTIVE ORGANS: Prostatectomy. BONES AND SOFT TISSUES: Diffuse osteopenia. Multilevel degenerative disc disease of the THORACOLUMBAR spine. No acute osseous abnormality. No focal soft tissue abnormality. IMPRESSION: 1. No acute findings in the abdomen or pelvis. 2. Scattered colonic diverticulosis. No changes of acute diverticulitis. Electronically signed by: Rogelia Myers MD 10/22/2024 02:12 PM EST RP Workstation: [id number]    Procedures   Medications Ordered in the ED  iohexol  (OMNIPAQUE ) 350 MG/ML injection 75 mL (75 mLs Intravenous Contrast Given 10/22/24 1333)     Medical Decision Making Amount and/or Complexity of Data Reviewed Labs: ordered.  This patient is a 68 year old male who presents to the ED for concern of periumbilical abdominal pain and black stool x 2 weeks, noted to open taking Pepto-Bismol for this.  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  Denies any other substance use.  Currently pain is controlled.  On physical exam, patient is in no acute distress, afebrile, alert and orient x 4, speaking in full sentences, nontachypneic, nontachycardic.  LCTAB, RRR, no murmur, no lower leg edema.  Notably does have some mild periumbilical abdominal tenderness to palpation with exam otherwise unremarkable.  No CVA tenderness.  Unremarkable exam otherwise.   Differential diagnoses prior to evaluation: The emergent differential diagnosis includes, but is not limited to, upper GI bleeding, lower GI bleed, secondary drug reaction, pancreatitis, cholecystitis, hepatitis, diverticulitis, gastritis, PUD, GERD.. This is not an exhaustive differential.   Past Medical History / Co-morbidities / Social History: Alcohol use disorder, COPD, PAD, schizoaffective disorder, cocaine use, gastric ulcers, CKD, NSTEMI, homelessness  Additional history: Chart reviewed. Pertinent results include:   Seen by neurology on 08/17/2024 for atrophied right hand with numbness and tingling.  Lab Tests/Imaging studies: I personally interpreted labs/imaging and the pertinent results include: CBC unremarkable CMP unremarkable UA unremarkable Lipase unremarkable CT abdomen shows diverticulosis without diverticulitis with no other acute abnormalities..  I agree with the radiologist interpretation.  Medications: I ordered medication including omeprazole .  I have reviewed the patients home medicines and have made adjustments as needed.  Critical  Interventions: None  Social Determinants of Health:  Disposition: After consideration of the diagnostic results and the patients response to treatment, I feel that the patient would benefit from discharge in June as above.   emergency department workup does not suggest an emergent condition requiring admission or immediate intervention beyond what has been performed at this time. The plan is: Refilled omeprazole , follow-up with PCP, follow-up with GI, return to ER for new or worsening symptoms. The patient is safe for discharge and has been instructed to return immediately for worsening symptoms, change in symptoms or any other concerns.   Final diagnoses:  Periumbilical pain  Black stool    ED Discharge Orders          Ordered    omeprazole  (PRILOSEC) 20 MG capsule  2 times daily before meals        10/22/24 1503  Beola Terrall RAMAN, [place] 10/22/24 1511    Levander Houston, MD 10/24/24 [phone]  "

## 2024-10-22 NOTE — ED Triage Notes (Signed)
 Pt hospitalized 6 months ago for bleeding stomach ulcers, and says he is having abdominal pain again starting about 2 weeks ago. Pt states he has been taking pepto bismol with no relief. Pt says stools are dark and black. Pt says he has no appetite at this time.  Pain level 6/10

## 2024-11-21 ENCOUNTER — Ambulatory Visit: Admitting: Podiatry

## 2024-11-21 ENCOUNTER — Encounter: Payer: Self-pay | Admitting: Podiatry

## 2024-11-21 DIAGNOSIS — B351 Tinea unguium: Secondary | ICD-10-CM

## 2024-11-21 DIAGNOSIS — M79674 Pain in right toe(s): Secondary | ICD-10-CM | POA: Diagnosis not present

## 2024-11-21 DIAGNOSIS — N179 Acute kidney failure, unspecified: Secondary | ICD-10-CM | POA: Diagnosis not present

## 2024-11-21 DIAGNOSIS — M79675 Pain in left toe(s): Secondary | ICD-10-CM

## 2024-11-21 NOTE — Progress Notes (Signed)
 This patient returns to my office for at risk foot care.  This patient requires this care by a professional since this patient will be at risk due to having PAD and CKD..    This patient is unable to cut nails himself since the patient cannot reach his nails.These nails are painful walking and wearing shoes. Patient also has painful callus on the outside on bottom of both feet. This patient presents for at risk foot care today.  General Appearance  Alert, conversant and in no acute stress.  Vascular  Dorsalis pedis and posterior tibial  pulses are palpable  bilaterally.  Capillary return is within normal limits  bilaterally. Temperature is within normal limits  bilaterally.  Neurologic  Senn-Weinstein monofilament wire test within normal limits  bilaterally. Muscle power within normal limits bilaterally.  Nails Thick disfigured discolored nails with subungual debris  from hallux to fifth toes bilaterally. No evidence of bacterial infection or drainage bilaterally.  Orthopedic  No limitations of motion  feet .  No crepitus or effusions noted.  No bony pathology or digital deformities noted. Plantar flexed fifth metatarsals  B/L.  Skin  normotropic skin with no porokeratosis noted bilaterally.  No signs of infections or ulcers noted.   Callus sub 5th met both feet.  Onychomycosis  Pain in right toes  Pain in left toes  Callus  B/L.  Consent was obtained for treatment procedures.   Mechanical debridement of nails 1-5  bilaterally performed with a nail nipper.  Filed with dremel without incident. Debride callus with dremel tool done as a courtesy.   Return office visit   3  months                  Told patient to return for periodic foot care and evaluation due to potential at risk complications.   Cordella Bold ROSENA  [place]

## 2024-11-29 NOTE — Telephone Encounter (Signed)
 Call attempted to pt at [phone], no ans full VM

## 2024-11-29 NOTE — Telephone Encounter (Signed)
 Call placed to [facility] for post op follow up. Reminders left for review of discharge paperwork for information on pain management, dressing care and removal and in office appt on 12/06/24.

## 2024-11-30 ENCOUNTER — Ambulatory Visit: Attending: Cardiology | Admitting: Cardiology

## 2024-12-05 ENCOUNTER — Ambulatory Visit: Admitting: Cardiology

## 2024-12-07 ENCOUNTER — Ambulatory Visit
